# Patient Record
Sex: Female | Born: 1935 | Race: Black or African American | Hispanic: No | Smoking: Never smoker
Health system: Southern US, Community
[De-identification: ages and names within clinical notes are randomized; demographics above are authoritative.]

## PROBLEM LIST (undated history)

## (undated) DIAGNOSIS — I5189 Other ill-defined heart diseases: Secondary | ICD-10-CM

## (undated) DIAGNOSIS — K449 Diaphragmatic hernia without obstruction or gangrene: Secondary | ICD-10-CM

## (undated) DIAGNOSIS — K2950 Unspecified chronic gastritis without bleeding: Secondary | ICD-10-CM

## (undated) DIAGNOSIS — C50919 Malignant neoplasm of unspecified site of unspecified female breast: Secondary | ICD-10-CM

## (undated) DIAGNOSIS — I10 Essential (primary) hypertension: Secondary | ICD-10-CM

## (undated) DIAGNOSIS — I872 Venous insufficiency (chronic) (peripheral): Secondary | ICD-10-CM

## (undated) DIAGNOSIS — M1990 Unspecified osteoarthritis, unspecified site: Secondary | ICD-10-CM

## (undated) DIAGNOSIS — J309 Allergic rhinitis, unspecified: Secondary | ICD-10-CM

## (undated) DIAGNOSIS — M545 Low back pain, unspecified: Secondary | ICD-10-CM

## (undated) DIAGNOSIS — F411 Generalized anxiety disorder: Secondary | ICD-10-CM

## (undated) DIAGNOSIS — G5600 Carpal tunnel syndrome, unspecified upper limb: Secondary | ICD-10-CM

## (undated) DIAGNOSIS — M47812 Spondylosis without myelopathy or radiculopathy, cervical region: Secondary | ICD-10-CM

## (undated) DIAGNOSIS — I452 Bifascicular block: Secondary | ICD-10-CM

## (undated) DIAGNOSIS — N281 Cyst of kidney, acquired: Secondary | ICD-10-CM

## (undated) DIAGNOSIS — G894 Chronic pain syndrome: Secondary | ICD-10-CM

## (undated) DIAGNOSIS — K635 Polyp of colon: Secondary | ICD-10-CM

## (undated) HISTORY — DX: Venous insufficiency (chronic) (peripheral): I87.2

## (undated) HISTORY — DX: Unspecified osteoarthritis, unspecified site: M19.90

## (undated) HISTORY — DX: Unspecified chronic gastritis without bleeding: K29.50

## (undated) HISTORY — DX: Low back pain: M54.5

## (undated) HISTORY — DX: Polyp of colon: K63.5

## (undated) HISTORY — DX: Bifascicular block: I45.2

## (undated) HISTORY — DX: Cyst of kidney, acquired: N28.1

## (undated) HISTORY — DX: Allergic rhinitis, unspecified: J30.9

## (undated) HISTORY — DX: Low back pain, unspecified: M54.50

## (undated) HISTORY — DX: Essential (primary) hypertension: I10

## (undated) HISTORY — DX: Diaphragmatic hernia without obstruction or gangrene: K44.9

## (undated) HISTORY — DX: Spondylosis without myelopathy or radiculopathy, cervical region: M47.812

## (undated) HISTORY — DX: Generalized anxiety disorder: F41.1

## (undated) HISTORY — PX: OTHER SURGICAL HISTORY: SHX169

## (undated) HISTORY — DX: Malignant neoplasm of unspecified site of unspecified female breast: C50.919

## (undated) HISTORY — DX: Other ill-defined heart diseases: I51.89

## (undated) HISTORY — DX: Chronic pain syndrome: G89.4

## (undated) HISTORY — PX: CARPAL TUNNEL RELEASE: SHX101

## (undated) HISTORY — DX: Carpal tunnel syndrome, unspecified upper limb: G56.00

---

## 2001-11-11 ENCOUNTER — Encounter: Admission: RE | Admit: 2001-11-11 | Discharge: 2001-11-11 | Payer: Self-pay | Admitting: Urology

## 2001-11-11 ENCOUNTER — Encounter: Payer: Self-pay | Admitting: Urology

## 2001-12-03 ENCOUNTER — Other Ambulatory Visit: Admission: RE | Admit: 2001-12-03 | Discharge: 2001-12-03 | Payer: Self-pay | Admitting: Obstetrics and Gynecology

## 2002-05-24 ENCOUNTER — Encounter: Payer: Self-pay | Admitting: Gastroenterology

## 2002-05-24 DIAGNOSIS — K5730 Diverticulosis of large intestine without perforation or abscess without bleeding: Secondary | ICD-10-CM

## 2002-05-24 HISTORY — DX: Diverticulosis of large intestine without perforation or abscess without bleeding: K57.30

## 2004-03-31 ENCOUNTER — Emergency Department (HOSPITAL_COMMUNITY): Admission: EM | Admit: 2004-03-31 | Discharge: 2004-03-31 | Payer: Self-pay | Admitting: Family Medicine

## 2004-09-20 ENCOUNTER — Ambulatory Visit: Payer: Self-pay | Admitting: Pulmonary Disease

## 2004-12-04 ENCOUNTER — Ambulatory Visit: Payer: Self-pay | Admitting: Pulmonary Disease

## 2004-12-10 ENCOUNTER — Ambulatory Visit (HOSPITAL_COMMUNITY): Admission: RE | Admit: 2004-12-10 | Discharge: 2004-12-10 | Payer: Self-pay | Admitting: Pulmonary Disease

## 2004-12-25 ENCOUNTER — Emergency Department (HOSPITAL_COMMUNITY): Admission: EM | Admit: 2004-12-25 | Discharge: 2004-12-25 | Payer: Self-pay | Admitting: Emergency Medicine

## 2004-12-31 ENCOUNTER — Ambulatory Visit: Payer: Self-pay | Admitting: Pulmonary Disease

## 2005-02-21 ENCOUNTER — Ambulatory Visit: Payer: Self-pay | Admitting: Pulmonary Disease

## 2005-03-03 ENCOUNTER — Ambulatory Visit: Payer: Self-pay | Admitting: Internal Medicine

## 2005-03-06 ENCOUNTER — Ambulatory Visit: Payer: Self-pay | Admitting: Pulmonary Disease

## 2005-03-13 ENCOUNTER — Ambulatory Visit: Payer: Self-pay

## 2005-03-18 ENCOUNTER — Ambulatory Visit: Payer: Self-pay | Admitting: Internal Medicine

## 2005-03-25 ENCOUNTER — Ambulatory Visit: Payer: Self-pay | Admitting: Pulmonary Disease

## 2005-03-26 ENCOUNTER — Ambulatory Visit: Payer: Self-pay | Admitting: Internal Medicine

## 2005-04-22 ENCOUNTER — Ambulatory Visit: Payer: Self-pay | Admitting: Pulmonary Disease

## 2005-05-29 ENCOUNTER — Ambulatory Visit: Payer: Self-pay | Admitting: Pulmonary Disease

## 2005-06-17 ENCOUNTER — Ambulatory Visit: Payer: Self-pay | Admitting: Pulmonary Disease

## 2005-07-24 ENCOUNTER — Encounter: Admission: RE | Admit: 2005-07-24 | Discharge: 2005-07-24 | Payer: Self-pay | Admitting: Orthopaedic Surgery

## 2005-08-18 ENCOUNTER — Ambulatory Visit: Payer: Self-pay | Admitting: Pulmonary Disease

## 2005-08-21 ENCOUNTER — Ambulatory Visit: Payer: Self-pay | Admitting: Pulmonary Disease

## 2005-09-18 ENCOUNTER — Ambulatory Visit: Payer: Self-pay | Admitting: Pulmonary Disease

## 2005-11-05 ENCOUNTER — Ambulatory Visit: Payer: Self-pay | Admitting: Pulmonary Disease

## 2005-11-14 ENCOUNTER — Ambulatory Visit: Payer: Self-pay | Admitting: Pulmonary Disease

## 2005-12-02 ENCOUNTER — Ambulatory Visit: Payer: Self-pay | Admitting: Pulmonary Disease

## 2005-12-26 ENCOUNTER — Ambulatory Visit: Payer: Self-pay | Admitting: Internal Medicine

## 2006-01-22 ENCOUNTER — Ambulatory Visit: Payer: Self-pay | Admitting: Internal Medicine

## 2006-02-03 ENCOUNTER — Ambulatory Visit: Payer: Self-pay | Admitting: Internal Medicine

## 2006-02-24 ENCOUNTER — Ambulatory Visit: Payer: Self-pay | Admitting: Pulmonary Disease

## 2006-04-09 ENCOUNTER — Encounter: Admission: RE | Admit: 2006-04-09 | Discharge: 2006-04-09 | Payer: Self-pay | Admitting: Orthopaedic Surgery

## 2006-06-26 ENCOUNTER — Ambulatory Visit: Payer: Self-pay | Admitting: Pulmonary Disease

## 2006-07-03 ENCOUNTER — Ambulatory Visit: Payer: Self-pay | Admitting: Internal Medicine

## 2006-07-03 ENCOUNTER — Ambulatory Visit: Payer: Self-pay | Admitting: Pulmonary Disease

## 2006-07-17 ENCOUNTER — Ambulatory Visit: Payer: Self-pay | Admitting: Internal Medicine

## 2006-07-29 ENCOUNTER — Ambulatory Visit: Payer: Self-pay | Admitting: Pulmonary Disease

## 2006-08-26 ENCOUNTER — Ambulatory Visit: Payer: Self-pay | Admitting: Cardiology

## 2006-09-02 ENCOUNTER — Ambulatory Visit: Payer: Self-pay

## 2006-09-08 ENCOUNTER — Ambulatory Visit: Payer: Self-pay | Admitting: Internal Medicine

## 2006-09-17 ENCOUNTER — Ambulatory Visit: Payer: Self-pay | Admitting: Pulmonary Disease

## 2006-11-04 ENCOUNTER — Ambulatory Visit: Payer: Self-pay | Admitting: Pulmonary Disease

## 2006-11-04 LAB — CONVERTED CEMR LAB
Crystals: NEGATIVE
Mucus, UA: NEGATIVE
Specific Gravity, Urine: 1.025 (ref 1.000–1.03)
Urine Glucose: NEGATIVE mg/dL
Urobilinogen, UA: 0.2 (ref 0.0–1.0)

## 2006-12-15 ENCOUNTER — Ambulatory Visit: Payer: Self-pay | Admitting: Internal Medicine

## 2006-12-16 ENCOUNTER — Ambulatory Visit: Payer: Self-pay | Admitting: Pulmonary Disease

## 2006-12-16 LAB — CONVERTED CEMR LAB
ALT: 20 units/L (ref 0–40)
AST: 25 units/L (ref 0–37)
Basophils Relative: 0.5 % (ref 0.0–1.0)
Bilirubin, Direct: 0.2 mg/dL (ref 0.0–0.3)
CO2: 29 meq/L (ref 19–32)
Calcium: 9.3 mg/dL (ref 8.4–10.5)
Eosinophils Absolute: 0.1 10*3/uL (ref 0.0–0.6)
Eosinophils Relative: 1.6 % (ref 0.0–5.0)
GFR calc Af Amer: 61 mL/min
GFR calc non Af Amer: 50 mL/min
Glucose, Bld: 100 mg/dL — ABNORMAL HIGH (ref 70–99)
HCT: 40.6 % (ref 36.0–46.0)
Hemoglobin: 13.9 g/dL (ref 12.0–15.0)
Lymphocytes Relative: 36.2 % (ref 12.0–46.0)
MCV: 91.9 fL (ref 78.0–100.0)
Neutro Abs: 3.4 10*3/uL (ref 1.4–7.7)
Neutrophils Relative %: 50.6 % (ref 43.0–77.0)
Sed Rate: 27 mm/hr — ABNORMAL HIGH (ref 0–25)
Sodium: 139 meq/L (ref 135–145)
Total Protein: 6.6 g/dL (ref 6.0–8.3)
WBC: 6.6 10*3/uL (ref 4.5–10.5)

## 2006-12-17 ENCOUNTER — Ambulatory Visit: Payer: Self-pay | Admitting: Cardiology

## 2007-01-01 ENCOUNTER — Ambulatory Visit: Payer: Self-pay | Admitting: Pulmonary Disease

## 2007-01-05 ENCOUNTER — Ambulatory Visit: Payer: Self-pay | Admitting: Gastroenterology

## 2007-01-05 LAB — CONVERTED CEMR LAB: Lipase: 38 units/L (ref 11.0–59.0)

## 2007-01-07 ENCOUNTER — Emergency Department (HOSPITAL_COMMUNITY): Admission: EM | Admit: 2007-01-07 | Discharge: 2007-01-07 | Payer: Self-pay | Admitting: Family Medicine

## 2007-01-13 ENCOUNTER — Encounter (INDEPENDENT_AMBULATORY_CARE_PROVIDER_SITE_OTHER): Payer: Self-pay | Admitting: *Deleted

## 2007-01-13 ENCOUNTER — Ambulatory Visit: Payer: Self-pay | Admitting: Gastroenterology

## 2007-02-02 ENCOUNTER — Ambulatory Visit: Payer: Self-pay | Admitting: Pulmonary Disease

## 2007-05-05 ENCOUNTER — Ambulatory Visit: Payer: Self-pay | Admitting: Pulmonary Disease

## 2007-06-14 ENCOUNTER — Ambulatory Visit: Payer: Self-pay | Admitting: Internal Medicine

## 2007-06-17 ENCOUNTER — Encounter: Payer: Self-pay | Admitting: Internal Medicine

## 2007-06-17 ENCOUNTER — Ambulatory Visit: Payer: Self-pay | Admitting: Internal Medicine

## 2007-06-17 ENCOUNTER — Ambulatory Visit: Payer: Self-pay

## 2007-06-17 LAB — CONVERTED CEMR LAB
BUN: 35 mg/dL — ABNORMAL HIGH (ref 6–23)
CO2: 31 meq/L (ref 19–32)
Creatinine, Ser: 1.2 mg/dL (ref 0.4–1.2)
GFR calc Af Amer: 55 mL/min
Potassium: 4.8 meq/L (ref 3.5–5.1)
Sodium: 140 meq/L (ref 135–145)

## 2007-07-14 ENCOUNTER — Ambulatory Visit: Payer: Self-pay | Admitting: Pulmonary Disease

## 2007-08-31 ENCOUNTER — Ambulatory Visit: Payer: Self-pay | Admitting: Pulmonary Disease

## 2007-09-15 DIAGNOSIS — M1990 Unspecified osteoarthritis, unspecified site: Secondary | ICD-10-CM

## 2007-09-15 DIAGNOSIS — I872 Venous insufficiency (chronic) (peripheral): Secondary | ICD-10-CM | POA: Insufficient documentation

## 2007-09-15 DIAGNOSIS — M545 Low back pain, unspecified: Secondary | ICD-10-CM | POA: Insufficient documentation

## 2007-09-15 DIAGNOSIS — F411 Generalized anxiety disorder: Secondary | ICD-10-CM | POA: Insufficient documentation

## 2007-09-15 DIAGNOSIS — I10 Essential (primary) hypertension: Secondary | ICD-10-CM | POA: Insufficient documentation

## 2007-09-17 ENCOUNTER — Ambulatory Visit: Payer: Self-pay | Admitting: Pulmonary Disease

## 2007-09-17 LAB — CONVERTED CEMR LAB
AST: 23 units/L (ref 0–37)
Albumin: 3.7 g/dL (ref 3.5–5.2)
Alkaline Phosphatase: 40 units/L (ref 39–117)
Bacteria, UA: NEGATIVE
Basophils Relative: 0.8 % (ref 0.0–1.0)
Bilirubin Urine: NEGATIVE
CO2: 31 meq/L (ref 19–32)
Chloride: 109 meq/L (ref 96–112)
Creatinine, Ser: 1 mg/dL (ref 0.4–1.2)
Crystals: NEGATIVE
Eosinophils Relative: 2.3 % (ref 0.0–5.0)
HCT: 39.6 % (ref 36.0–46.0)
Hemoglobin: 13.5 g/dL (ref 12.0–15.0)
LDL Cholesterol: 103 mg/dL — ABNORMAL HIGH (ref 0–99)
Lymphocytes Relative: 36.1 % (ref 12.0–46.0)
Monocytes Absolute: 0.6 10*3/uL (ref 0.2–0.7)
Neutro Abs: 3 10*3/uL (ref 1.4–7.7)
Neutrophils Relative %: 49.9 % (ref 43.0–77.0)
Nitrite: NEGATIVE
RDW: 13.2 % (ref 11.5–14.6)
Sodium: 144 meq/L (ref 135–145)
Specific Gravity, Urine: 1.02 (ref 1.000–1.03)
TSH: 1.25 microintl units/mL (ref 0.35–5.50)
Total Bilirubin: 1.2 mg/dL (ref 0.3–1.2)
Total Protein, Urine: NEGATIVE mg/dL
Total Protein: 6.9 g/dL (ref 6.0–8.3)
VLDL: 11 mg/dL (ref 0–40)

## 2007-09-22 LAB — CONVERTED CEMR LAB
ALT: 21 units/L (ref 0–35)
AST: 23 units/L (ref 0–37)
Albumin: 3.7 g/dL (ref 3.5–5.2)
Alkaline Phosphatase: 40 units/L (ref 39–117)
BUN: 22 mg/dL (ref 6–23)
Bacteria, UA: NEGATIVE
Basophils Absolute: 0 10*3/uL (ref 0.0–0.1)
Basophils Relative: 0.8 % (ref 0.0–1.0)
Bilirubin Urine: NEGATIVE
Bilirubin, Direct: 0.2 mg/dL (ref 0.0–0.3)
CO2: 31 meq/L (ref 19–32)
Calcium: 9.1 mg/dL (ref 8.4–10.5)
Chloride: 109 meq/L (ref 96–112)
Cholesterol: 190 mg/dL (ref 0–200)
Creatinine, Ser: 1 mg/dL (ref 0.4–1.2)
Crystals: NEGATIVE
Eosinophils Absolute: 0.1 10*3/uL (ref 0.0–0.6)
Eosinophils Relative: 2.3 % (ref 0.0–5.0)
Fecal Occult Blood: NEGATIVE
GFR calc Af Amer: 67 mL/min
GFR calc non Af Amer: 56 mL/min
Glucose, Bld: 103 mg/dL — ABNORMAL HIGH (ref 70–99)
HCT: 39.6 % (ref 36.0–46.0)
HDL: 75.4 mg/dL (ref >39.0)
Hemoglobin: 13.5 g/dL (ref 12.0–15.0)
Ketones, ur: NEGATIVE mg/dL
LDL Cholesterol: 103 mg/dL — ABNORMAL HIGH (ref 0–99)
Leukocytes, UA: NEGATIVE
Lymphocytes Relative: 36.1 % (ref 12.0–46.0)
MCHC: 34 g/dL (ref 30.0–36.0)
MCV: 93.7 fL (ref 78.0–100.0)
Monocytes Absolute: 0.6 10*3/uL (ref 0.2–0.7)
Monocytes Relative: 10.9 % (ref 3.0–11.0)
Neutro Abs: 3 10*3/uL (ref 1.4–7.7)
Neutrophils Relative %: 49.9 % (ref 43.0–77.0)
Nitrite: NEGATIVE
OCCULT 1: NEGATIVE
OCCULT 2: NEGATIVE
OCCULT 3: NEGATIVE
OCCULT 4: NEGATIVE
OCCULT 5: NEGATIVE
Platelets: 227 10*3/uL (ref 150–400)
Potassium: 4.6 meq/L (ref 3.5–5.1)
RBC: 4.23 M/uL (ref 3.87–5.11)
RDW: 13.2 % (ref 11.5–14.6)
Sodium: 144 meq/L (ref 135–145)
Specific Gravity, Urine: 1.02 (ref 1.000–1.03)
TSH: 1.25 microintl units/mL (ref 0.35–5.50)
Total Bilirubin: 1.2 mg/dL (ref 0.3–1.2)
Total CHOL/HDL Ratio: 2.5
Total Protein, Urine: NEGATIVE mg/dL
Total Protein: 6.9 g/dL (ref 6.0–8.3)
Triglycerides: 57 mg/dL (ref 0–149)
Urine Glucose: NEGATIVE mg/dL
Urobilinogen, UA: 0.2 (ref 0.0–1.0)
VLDL: 11 mg/dL (ref 0–40)
WBC: 5.8 10*3/uL (ref 4.5–10.5)
pH: 6 (ref 5.0–8.0)

## 2007-10-12 ENCOUNTER — Ambulatory Visit: Payer: Self-pay | Admitting: Pulmonary Disease

## 2007-10-25 ENCOUNTER — Ambulatory Visit: Payer: Self-pay | Admitting: Gastroenterology

## 2007-11-25 ENCOUNTER — Ambulatory Visit: Payer: Self-pay | Admitting: Internal Medicine

## 2007-12-01 ENCOUNTER — Encounter: Payer: Self-pay | Admitting: Pulmonary Disease

## 2007-12-01 ENCOUNTER — Ambulatory Visit: Payer: Self-pay

## 2007-12-14 ENCOUNTER — Ambulatory Visit: Payer: Self-pay | Admitting: Pulmonary Disease

## 2007-12-16 ENCOUNTER — Ambulatory Visit: Payer: Self-pay | Admitting: Cardiology

## 2007-12-26 DIAGNOSIS — N281 Cyst of kidney, acquired: Secondary | ICD-10-CM | POA: Insufficient documentation

## 2007-12-26 DIAGNOSIS — R079 Chest pain, unspecified: Secondary | ICD-10-CM | POA: Insufficient documentation

## 2007-12-26 DIAGNOSIS — K449 Diaphragmatic hernia without obstruction or gangrene: Secondary | ICD-10-CM | POA: Insufficient documentation

## 2007-12-26 LAB — CONVERTED CEMR LAB
Albumin: 3.7 g/dL (ref 3.5–5.2)
BUN: 19 mg/dL (ref 6–23)
Basophils Absolute: 0 10*3/uL (ref 0.0–0.1)
Eosinophils Absolute: 0.1 10*3/uL (ref 0.0–0.6)
GFR calc Af Amer: 60 mL/min
GFR calc non Af Amer: 50 mL/min
Lymphocytes Relative: 46.8 % — ABNORMAL HIGH (ref 12.0–46.0)
Monocytes Absolute: 0.8 10*3/uL — ABNORMAL HIGH (ref 0.2–0.7)
Neutro Abs: 2.7 10*3/uL (ref 1.4–7.7)
Neutrophils Relative %: 39.3 % — ABNORMAL LOW (ref 43.0–77.0)
Platelets: 218 10*3/uL (ref 150–400)
Potassium: 4.6 meq/L (ref 3.5–5.1)
RBC: 4.26 M/uL (ref 3.87–5.11)
RDW: 12.8 % (ref 11.5–14.6)
Sed Rate: 20 mm/hr (ref 0–25)
Sodium: 141 meq/L (ref 135–145)
Total Bilirubin: 0.9 mg/dL (ref 0.3–1.2)
WBC: 6.8 10*3/uL (ref 4.5–10.5)

## 2007-12-28 ENCOUNTER — Encounter: Payer: Self-pay | Admitting: Pulmonary Disease

## 2008-01-31 ENCOUNTER — Encounter: Payer: Self-pay | Admitting: Pulmonary Disease

## 2008-03-07 ENCOUNTER — Ambulatory Visit: Payer: Self-pay | Admitting: Pulmonary Disease

## 2008-03-10 ENCOUNTER — Telehealth (INDEPENDENT_AMBULATORY_CARE_PROVIDER_SITE_OTHER): Payer: Self-pay | Admitting: *Deleted

## 2008-03-13 ENCOUNTER — Telehealth (INDEPENDENT_AMBULATORY_CARE_PROVIDER_SITE_OTHER): Payer: Self-pay | Admitting: *Deleted

## 2008-03-13 ENCOUNTER — Encounter: Payer: Self-pay | Admitting: Pulmonary Disease

## 2008-03-21 ENCOUNTER — Ambulatory Visit: Payer: Self-pay | Admitting: Pulmonary Disease

## 2008-03-21 DIAGNOSIS — M47812 Spondylosis without myelopathy or radiculopathy, cervical region: Secondary | ICD-10-CM | POA: Insufficient documentation

## 2008-05-04 ENCOUNTER — Telehealth (INDEPENDENT_AMBULATORY_CARE_PROVIDER_SITE_OTHER): Payer: Self-pay | Admitting: *Deleted

## 2008-05-05 ENCOUNTER — Ambulatory Visit: Payer: Self-pay | Admitting: Internal Medicine

## 2008-05-15 ENCOUNTER — Encounter: Payer: Self-pay | Admitting: Pulmonary Disease

## 2008-05-23 ENCOUNTER — Ambulatory Visit: Payer: Self-pay | Admitting: Internal Medicine

## 2008-05-23 LAB — CONVERTED CEMR LAB
Calcium: 9.4 mg/dL (ref 8.4–10.5)
GFR calc Af Amer: 55 mL/min
GFR calc non Af Amer: 45 mL/min
Glucose, Bld: 100 mg/dL — ABNORMAL HIGH (ref 70–99)
Potassium: 5.4 meq/L — ABNORMAL HIGH (ref 3.5–5.1)
Pro B Natriuretic peptide (BNP): 28 pg/mL (ref 0.0–100.0)
Sodium: 139 meq/L (ref 135–145)
TSH: 1.46 microintl units/mL (ref 0.35–5.50)

## 2008-06-09 ENCOUNTER — Telehealth (INDEPENDENT_AMBULATORY_CARE_PROVIDER_SITE_OTHER): Payer: Self-pay | Admitting: *Deleted

## 2008-06-16 ENCOUNTER — Ambulatory Visit: Payer: Self-pay | Admitting: Pulmonary Disease

## 2008-06-22 ENCOUNTER — Ambulatory Visit: Payer: Self-pay | Admitting: Pulmonary Disease

## 2008-06-26 ENCOUNTER — Encounter: Payer: Self-pay | Admitting: Pulmonary Disease

## 2008-07-07 ENCOUNTER — Encounter: Payer: Self-pay | Admitting: Pulmonary Disease

## 2008-07-10 ENCOUNTER — Telehealth (INDEPENDENT_AMBULATORY_CARE_PROVIDER_SITE_OTHER): Payer: Self-pay | Admitting: *Deleted

## 2008-07-27 ENCOUNTER — Telehealth (INDEPENDENT_AMBULATORY_CARE_PROVIDER_SITE_OTHER): Payer: Self-pay | Admitting: *Deleted

## 2008-08-05 ENCOUNTER — Emergency Department (HOSPITAL_COMMUNITY): Admission: EM | Admit: 2008-08-05 | Discharge: 2008-08-05 | Payer: Self-pay | Admitting: Family Medicine

## 2008-08-07 ENCOUNTER — Telehealth: Payer: Self-pay | Admitting: Pulmonary Disease

## 2008-08-07 ENCOUNTER — Ambulatory Visit: Payer: Self-pay | Admitting: Pulmonary Disease

## 2008-08-24 ENCOUNTER — Ambulatory Visit: Payer: Self-pay

## 2008-10-03 ENCOUNTER — Ambulatory Visit: Payer: Self-pay | Admitting: Pulmonary Disease

## 2008-10-03 DIAGNOSIS — G5603 Carpal tunnel syndrome, bilateral upper limbs: Secondary | ICD-10-CM

## 2008-10-03 HISTORY — DX: Carpal tunnel syndrome, bilateral upper limbs: G56.03

## 2008-10-17 ENCOUNTER — Telehealth: Payer: Self-pay | Admitting: Pulmonary Disease

## 2008-10-19 ENCOUNTER — Telehealth (INDEPENDENT_AMBULATORY_CARE_PROVIDER_SITE_OTHER): Payer: Self-pay | Admitting: *Deleted

## 2008-10-23 ENCOUNTER — Encounter: Payer: Self-pay | Admitting: Pulmonary Disease

## 2008-11-01 ENCOUNTER — Telehealth: Payer: Self-pay | Admitting: Pulmonary Disease

## 2008-12-07 ENCOUNTER — Ambulatory Visit: Payer: Self-pay | Admitting: Pulmonary Disease

## 2008-12-14 ENCOUNTER — Ambulatory Visit: Payer: Self-pay | Admitting: Pulmonary Disease

## 2008-12-24 LAB — CONVERTED CEMR LAB
ALT: 16 units/L (ref 0–35)
AST: 21 units/L (ref 0–37)
Albumin: 3.6 g/dL (ref 3.5–5.2)
BUN: 16 mg/dL (ref 6–23)
Basophils Absolute: 0.1 10*3/uL (ref 0.0–0.1)
Basophils Relative: 1.1 % (ref 0.0–3.0)
Calcium: 9.4 mg/dL (ref 8.4–10.5)
Cholesterol: 194 mg/dL (ref 0–200)
Creatinine, Ser: 1 mg/dL (ref 0.4–1.2)
Eosinophils Absolute: 0.1 10*3/uL (ref 0.0–0.7)
Eosinophils Relative: 1.6 % (ref 0.0–5.0)
GFR calc non Af Amer: 56 mL/min
HCT: 41.7 % (ref 36.0–46.0)
Hemoglobin: 14 g/dL (ref 12.0–15.0)
MCHC: 33.6 g/dL (ref 30.0–36.0)
MCV: 91.5 fL (ref 78.0–100.0)
Neutro Abs: 2.8 10*3/uL (ref 1.4–7.7)
RBC: 4.56 M/uL (ref 3.87–5.11)
TSH: 1.73 microintl units/mL (ref 0.35–5.50)
Total Bilirubin: 1.2 mg/dL (ref 0.3–1.2)
WBC: 6.4 10*3/uL (ref 4.5–10.5)

## 2008-12-27 ENCOUNTER — Telehealth (INDEPENDENT_AMBULATORY_CARE_PROVIDER_SITE_OTHER): Payer: Self-pay | Admitting: *Deleted

## 2009-01-01 ENCOUNTER — Telehealth: Payer: Self-pay | Admitting: Gastroenterology

## 2009-01-10 ENCOUNTER — Telehealth: Payer: Self-pay | Admitting: Gastroenterology

## 2009-01-18 ENCOUNTER — Ambulatory Visit: Payer: Self-pay | Admitting: Gastroenterology

## 2009-01-18 DIAGNOSIS — K5900 Constipation, unspecified: Secondary | ICD-10-CM | POA: Insufficient documentation

## 2009-01-18 DIAGNOSIS — R141 Gas pain: Secondary | ICD-10-CM | POA: Insufficient documentation

## 2009-01-18 DIAGNOSIS — R142 Eructation: Secondary | ICD-10-CM

## 2009-01-18 DIAGNOSIS — R143 Flatulence: Secondary | ICD-10-CM

## 2009-02-21 ENCOUNTER — Ambulatory Visit: Payer: Self-pay | Admitting: Pulmonary Disease

## 2009-02-21 ENCOUNTER — Telehealth (INDEPENDENT_AMBULATORY_CARE_PROVIDER_SITE_OTHER): Payer: Self-pay | Admitting: *Deleted

## 2009-02-21 DIAGNOSIS — R5383 Other fatigue: Secondary | ICD-10-CM

## 2009-02-21 DIAGNOSIS — R5381 Other malaise: Secondary | ICD-10-CM | POA: Insufficient documentation

## 2009-03-05 ENCOUNTER — Telehealth: Payer: Self-pay | Admitting: Internal Medicine

## 2009-03-12 ENCOUNTER — Ambulatory Visit: Payer: Self-pay | Admitting: Internal Medicine

## 2009-03-14 ENCOUNTER — Telehealth (INDEPENDENT_AMBULATORY_CARE_PROVIDER_SITE_OTHER): Payer: Self-pay | Admitting: *Deleted

## 2009-03-21 ENCOUNTER — Ambulatory Visit: Payer: Self-pay | Admitting: Pulmonary Disease

## 2009-03-21 DIAGNOSIS — R071 Chest pain on breathing: Secondary | ICD-10-CM | POA: Insufficient documentation

## 2009-03-21 DIAGNOSIS — I5032 Chronic diastolic (congestive) heart failure: Secondary | ICD-10-CM | POA: Insufficient documentation

## 2009-03-21 DIAGNOSIS — I5030 Unspecified diastolic (congestive) heart failure: Secondary | ICD-10-CM | POA: Insufficient documentation

## 2009-03-21 DIAGNOSIS — R109 Unspecified abdominal pain: Secondary | ICD-10-CM | POA: Insufficient documentation

## 2009-04-02 ENCOUNTER — Ambulatory Visit (HOSPITAL_COMMUNITY): Admission: RE | Admit: 2009-04-02 | Discharge: 2009-04-02 | Payer: Self-pay | Admitting: Pulmonary Disease

## 2009-04-03 ENCOUNTER — Telehealth (INDEPENDENT_AMBULATORY_CARE_PROVIDER_SITE_OTHER): Payer: Self-pay | Admitting: *Deleted

## 2009-04-09 ENCOUNTER — Telehealth: Payer: Self-pay | Admitting: Pulmonary Disease

## 2009-04-27 ENCOUNTER — Telehealth (INDEPENDENT_AMBULATORY_CARE_PROVIDER_SITE_OTHER): Payer: Self-pay | Admitting: *Deleted

## 2009-05-15 ENCOUNTER — Encounter: Payer: Self-pay | Admitting: Pulmonary Disease

## 2009-05-16 ENCOUNTER — Encounter: Payer: Self-pay | Admitting: Pulmonary Disease

## 2009-05-17 ENCOUNTER — Telehealth (INDEPENDENT_AMBULATORY_CARE_PROVIDER_SITE_OTHER): Payer: Self-pay | Admitting: *Deleted

## 2009-05-31 ENCOUNTER — Encounter: Admission: RE | Admit: 2009-05-31 | Discharge: 2009-05-31 | Payer: Self-pay | Admitting: Orthopaedic Surgery

## 2009-06-13 ENCOUNTER — Ambulatory Visit: Payer: Self-pay | Admitting: Pulmonary Disease

## 2009-06-26 ENCOUNTER — Ambulatory Visit: Payer: Self-pay | Admitting: Internal Medicine

## 2009-06-26 DIAGNOSIS — R0602 Shortness of breath: Secondary | ICD-10-CM | POA: Insufficient documentation

## 2009-07-19 ENCOUNTER — Ambulatory Visit: Payer: Self-pay | Admitting: Pulmonary Disease

## 2009-07-31 ENCOUNTER — Encounter: Payer: Self-pay | Admitting: Pulmonary Disease

## 2009-08-16 ENCOUNTER — Telehealth (INDEPENDENT_AMBULATORY_CARE_PROVIDER_SITE_OTHER): Payer: Self-pay | Admitting: *Deleted

## 2009-08-16 ENCOUNTER — Ambulatory Visit: Payer: Self-pay | Admitting: Pulmonary Disease

## 2009-08-20 LAB — CONVERTED CEMR LAB
Bilirubin Urine: NEGATIVE
Ketones, ur: NEGATIVE mg/dL
Nitrite: POSITIVE
Specific Gravity, Urine: <=1.005 (ref 1.000–1.030)
Urobilinogen, UA: 0.2 (ref 0.0–1.0)
pH: 6 (ref 5.0–8.0)

## 2009-08-31 ENCOUNTER — Ambulatory Visit: Payer: Self-pay | Admitting: Internal Medicine

## 2009-08-31 ENCOUNTER — Telehealth (INDEPENDENT_AMBULATORY_CARE_PROVIDER_SITE_OTHER): Payer: Self-pay | Admitting: *Deleted

## 2009-08-31 DIAGNOSIS — R42 Dizziness and giddiness: Secondary | ICD-10-CM | POA: Insufficient documentation

## 2009-09-03 LAB — CONVERTED CEMR LAB
BUN: 20 mg/dL (ref 6–23)
CO2: 30 meq/L (ref 19–32)
Glucose, Bld: 98 mg/dL (ref 70–99)
Potassium: 4.2 meq/L (ref 3.5–5.1)
Sodium: 141 meq/L (ref 135–145)

## 2009-09-10 ENCOUNTER — Telehealth: Payer: Self-pay | Admitting: Pulmonary Disease

## 2009-09-12 ENCOUNTER — Ambulatory Visit: Payer: Self-pay | Admitting: Pulmonary Disease

## 2009-09-14 ENCOUNTER — Telehealth: Payer: Self-pay | Admitting: Adult Health

## 2009-09-19 ENCOUNTER — Telehealth: Payer: Self-pay | Admitting: Pulmonary Disease

## 2009-09-25 ENCOUNTER — Ambulatory Visit: Payer: Self-pay | Admitting: Gynecology

## 2009-09-25 ENCOUNTER — Encounter: Payer: Self-pay | Admitting: Gynecology

## 2009-09-25 ENCOUNTER — Other Ambulatory Visit: Admission: RE | Admit: 2009-09-25 | Discharge: 2009-09-25 | Payer: Self-pay | Admitting: Gynecology

## 2009-10-08 ENCOUNTER — Telehealth: Payer: Self-pay | Admitting: Pulmonary Disease

## 2009-10-12 ENCOUNTER — Telehealth (INDEPENDENT_AMBULATORY_CARE_PROVIDER_SITE_OTHER): Payer: Self-pay | Admitting: *Deleted

## 2009-10-12 ENCOUNTER — Telehealth: Payer: Self-pay | Admitting: Gastroenterology

## 2009-10-12 ENCOUNTER — Ambulatory Visit: Payer: Self-pay | Admitting: Internal Medicine

## 2009-10-15 ENCOUNTER — Telehealth (INDEPENDENT_AMBULATORY_CARE_PROVIDER_SITE_OTHER): Payer: Self-pay | Admitting: *Deleted

## 2009-10-15 LAB — CONVERTED CEMR LAB
Basophils Absolute: 0.1 10*3/uL (ref 0.0–0.1)
Bilirubin Urine: NEGATIVE
Eosinophils Absolute: 0.1 10*3/uL (ref 0.0–0.7)
HCT: 40.5 % (ref 36.0–46.0)
Hemoglobin: 13.5 g/dL (ref 12.0–15.0)
Ketones, ur: NEGATIVE mg/dL
Leukocytes, UA: NEGATIVE
Lymphs Abs: 3.2 10*3/uL (ref 0.7–4.0)
MCHC: 33.4 g/dL (ref 30.0–36.0)
MCV: 96.4 fL (ref 78.0–100.0)
Monocytes Absolute: 1 10*3/uL (ref 0.1–1.0)
Monocytes Relative: 12.3 % — ABNORMAL HIGH (ref 3.0–12.0)
Neutro Abs: 3.4 10*3/uL (ref 1.4–7.7)
Platelets: 206 10*3/uL (ref 150.0–400.0)
RDW: 13 % (ref 11.5–14.6)
pH: 5.5 (ref 5.0–8.0)

## 2009-10-16 ENCOUNTER — Ambulatory Visit: Payer: Self-pay | Admitting: Pulmonary Disease

## 2009-10-16 ENCOUNTER — Encounter: Payer: Self-pay | Admitting: Adult Health

## 2009-10-16 LAB — CONVERTED CEMR LAB
Bilirubin Urine: NEGATIVE
Nitrite: NEGATIVE
Total Protein, Urine: NEGATIVE mg/dL
Urine Glucose: NEGATIVE mg/dL
pH: 6 (ref 5.0–8.0)

## 2009-11-11 ENCOUNTER — Ambulatory Visit: Payer: Self-pay | Admitting: Cardiovascular Disease

## 2009-11-11 ENCOUNTER — Inpatient Hospital Stay (HOSPITAL_COMMUNITY): Admission: EM | Admit: 2009-11-11 | Discharge: 2009-11-13 | Payer: Self-pay | Admitting: Emergency Medicine

## 2009-11-13 ENCOUNTER — Encounter (INDEPENDENT_AMBULATORY_CARE_PROVIDER_SITE_OTHER): Payer: Self-pay | Admitting: Internal Medicine

## 2009-11-19 ENCOUNTER — Telehealth: Payer: Self-pay | Admitting: Pulmonary Disease

## 2009-11-28 ENCOUNTER — Telehealth (INDEPENDENT_AMBULATORY_CARE_PROVIDER_SITE_OTHER): Payer: Self-pay | Admitting: *Deleted

## 2009-12-06 ENCOUNTER — Ambulatory Visit: Payer: Self-pay | Admitting: Gastroenterology

## 2009-12-06 ENCOUNTER — Encounter (INDEPENDENT_AMBULATORY_CARE_PROVIDER_SITE_OTHER): Payer: Self-pay | Admitting: *Deleted

## 2009-12-07 ENCOUNTER — Ambulatory Visit: Payer: Self-pay | Admitting: Cardiovascular Disease

## 2009-12-18 ENCOUNTER — Ambulatory Visit: Payer: Self-pay | Admitting: Gastroenterology

## 2009-12-19 ENCOUNTER — Ambulatory Visit: Payer: Self-pay | Admitting: Pulmonary Disease

## 2009-12-19 DIAGNOSIS — K635 Polyp of colon: Secondary | ICD-10-CM

## 2009-12-19 HISTORY — DX: Polyp of colon: K63.5

## 2009-12-20 ENCOUNTER — Encounter: Payer: Self-pay | Admitting: Gastroenterology

## 2009-12-21 ENCOUNTER — Telehealth: Payer: Self-pay | Admitting: Pulmonary Disease

## 2010-01-02 ENCOUNTER — Ambulatory Visit: Payer: Self-pay | Admitting: Pulmonary Disease

## 2010-01-02 ENCOUNTER — Telehealth: Payer: Self-pay | Admitting: Pulmonary Disease

## 2010-01-08 ENCOUNTER — Telehealth (INDEPENDENT_AMBULATORY_CARE_PROVIDER_SITE_OTHER): Payer: Self-pay | Admitting: *Deleted

## 2010-01-09 ENCOUNTER — Encounter: Payer: Self-pay | Admitting: Pulmonary Disease

## 2010-02-06 ENCOUNTER — Encounter: Payer: Self-pay | Admitting: Pulmonary Disease

## 2010-02-18 ENCOUNTER — Telehealth: Payer: Self-pay | Admitting: Gastroenterology

## 2010-02-25 ENCOUNTER — Telehealth (INDEPENDENT_AMBULATORY_CARE_PROVIDER_SITE_OTHER): Payer: Self-pay | Admitting: *Deleted

## 2010-02-28 ENCOUNTER — Encounter: Payer: Self-pay | Admitting: Pulmonary Disease

## 2010-02-28 ENCOUNTER — Telehealth (INDEPENDENT_AMBULATORY_CARE_PROVIDER_SITE_OTHER): Payer: Self-pay | Admitting: *Deleted

## 2010-03-05 ENCOUNTER — Encounter: Admission: RE | Admit: 2010-03-05 | Discharge: 2010-03-05 | Payer: Self-pay | Admitting: General Surgery

## 2010-03-20 ENCOUNTER — Ambulatory Visit: Payer: Self-pay | Admitting: Pulmonary Disease

## 2010-03-22 ENCOUNTER — Telehealth: Payer: Self-pay | Admitting: Pulmonary Disease

## 2010-03-23 LAB — CONVERTED CEMR LAB
Basophils Relative: 0.6 % (ref 0.0–3.0)
Bilirubin, Direct: 0.1 mg/dL (ref 0.0–0.3)
Calcium: 9.7 mg/dL (ref 8.4–10.5)
Chloride: 107 meq/L (ref 96–112)
Creatinine, Ser: 1.1 mg/dL (ref 0.4–1.2)
Eosinophils Absolute: 0.1 10*3/uL (ref 0.0–0.7)
Hemoglobin: 14 g/dL (ref 12.0–15.0)
Lymphs Abs: 2.7 10*3/uL (ref 0.7–4.0)
MCHC: 33.7 g/dL (ref 30.0–36.0)
MCV: 94.7 fL (ref 78.0–100.0)
Monocytes Absolute: 0.8 10*3/uL (ref 0.1–1.0)
Neutro Abs: 3.5 10*3/uL (ref 1.4–7.7)
RBC: 4.38 M/uL (ref 3.87–5.11)
Sodium: 145 meq/L (ref 135–145)
Total Bilirubin: 0.6 mg/dL (ref 0.3–1.2)
Vit D, 25-Hydroxy: 51 ng/mL (ref 30–89)

## 2010-03-27 ENCOUNTER — Ambulatory Visit: Payer: Self-pay | Admitting: Gastroenterology

## 2010-03-27 DIAGNOSIS — K219 Gastro-esophageal reflux disease without esophagitis: Secondary | ICD-10-CM | POA: Insufficient documentation

## 2010-04-01 ENCOUNTER — Encounter: Payer: Self-pay | Admitting: Pulmonary Disease

## 2010-04-10 ENCOUNTER — Telehealth: Payer: Self-pay | Admitting: Pulmonary Disease

## 2010-04-29 ENCOUNTER — Telehealth (INDEPENDENT_AMBULATORY_CARE_PROVIDER_SITE_OTHER): Payer: Self-pay | Admitting: *Deleted

## 2010-05-13 ENCOUNTER — Encounter: Payer: Self-pay | Admitting: Pulmonary Disease

## 2010-06-03 ENCOUNTER — Encounter: Payer: Self-pay | Admitting: Pulmonary Disease

## 2010-07-03 ENCOUNTER — Ambulatory Visit: Payer: Self-pay | Admitting: Pulmonary Disease

## 2010-07-25 ENCOUNTER — Telehealth (INDEPENDENT_AMBULATORY_CARE_PROVIDER_SITE_OTHER): Payer: Self-pay | Admitting: *Deleted

## 2010-08-07 ENCOUNTER — Telehealth (INDEPENDENT_AMBULATORY_CARE_PROVIDER_SITE_OTHER): Payer: Self-pay | Admitting: *Deleted

## 2010-08-16 ENCOUNTER — Ambulatory Visit: Payer: Self-pay | Admitting: Pulmonary Disease

## 2010-08-16 ENCOUNTER — Telehealth (INDEPENDENT_AMBULATORY_CARE_PROVIDER_SITE_OTHER): Payer: Self-pay | Admitting: *Deleted

## 2010-08-29 ENCOUNTER — Telehealth: Payer: Self-pay | Admitting: Internal Medicine

## 2010-09-11 ENCOUNTER — Encounter: Payer: Self-pay | Admitting: Internal Medicine

## 2010-09-11 ENCOUNTER — Ambulatory Visit: Payer: Self-pay | Admitting: Internal Medicine

## 2010-10-31 ENCOUNTER — Ambulatory Visit: Payer: Self-pay | Admitting: Pulmonary Disease

## 2010-11-01 ENCOUNTER — Ambulatory Visit: Payer: Self-pay | Admitting: Gynecology

## 2010-11-06 ENCOUNTER — Ambulatory Visit: Payer: Self-pay | Admitting: Gynecology

## 2010-11-06 ENCOUNTER — Encounter: Payer: Self-pay | Admitting: Pulmonary Disease

## 2010-11-12 ENCOUNTER — Telehealth: Payer: Self-pay | Admitting: Pulmonary Disease

## 2010-12-02 ENCOUNTER — Encounter: Payer: Self-pay | Admitting: Pulmonary Disease

## 2010-12-02 ENCOUNTER — Telehealth (INDEPENDENT_AMBULATORY_CARE_PROVIDER_SITE_OTHER): Payer: Self-pay | Admitting: *Deleted

## 2010-12-08 ENCOUNTER — Encounter: Payer: Self-pay | Admitting: Orthopaedic Surgery

## 2010-12-19 NOTE — Letter (Signed)
Summary: Patient Notice- Colon Biospy Results  [facility]  [address] [place], [place] [address]   Phone: [phone]  Fax: [phone]        December 20, 2009 MRN: [id number]    [facility] [address] [place], [place]  [address]    Dear Heather [name],  I am pleased to inform you that the biopsies taken during your recent colonoscopy did not show any evidence of cancer upon pathologic examination. The biopsy was normal.   No further action is needed at this time.  Please follow-up with      your primary care physician for your other healthcare needs.  Continue with the treatment plan as outlined on the day of your      exam.  Please call us if you are having persistent problems or have questions about your condition that have not been fully answered at this time.  Sincerely,  Meryl Dare MD [facility]  This letter has been electronically signed by your physician.

## 2010-12-19 NOTE — Progress Notes (Signed)
Summary: speak to nurse  Phone Note Call from Patient Call back at Home Phone [phone]   Caller: Patient Call For: NADEL Reason for Call: Talk to Nurse Summary of Call: Patient asking to speak to Dr. Jodelle Green nurse about her appt with Dr. Audie Box.  She would not give me any other info. Initial call taken by: Lehman Prom,  November 12, 2010 2:35 PM  Follow-up for Phone Call        called spoke with patient who states she saw Dr. Audie Box on 12.16 and had pap done and results were supposed to be faxed to [facility] attention.  pt would like to know if SN feels she needs a f/u appt here.    also c/o a knot on the left side of her throat making difficult to swallow with white foamy mucus x2weeks.  states she saw a ENT for this same issue 5-35months ago but it has returned.    please advise, thanks! Follow-up by: Boone Master CNA/MA,  November 12, 2010 3:50 PM  Additional Follow-up for Phone Call Additional follow up Details #1::        per SN---call in MMW  #4 oz  for this   1 tsp gargle and swallow four times daily as needed  and she will need to call ENT to set up follow up for this---she does not need follow up right now but she is to keep her next appt with SN---called and spoke with pt and she stated that the MMW does not work for her that she already has some so i informed her to call the ENT to make appt for this---she is aware to keep her appt to see SN in march. Randell Loop [facility]  November 12, 2010 4:30 PM

## 2010-12-19 NOTE — Progress Notes (Signed)
Summary: congestion / sore throat/refer to ent  Phone Note Call from Patient   Caller: Patient Call For: nadel Summary of Call: congestion in the throat magic mouthwash is not helping or mucinex Initial call taken by: Rickard Patience,  January 08, 2010 10:18 AM  Follow-up for Phone Call        called and spoke with pt.  pt states she recently saw SN 01/02/2010 with "throat problems."  Pt states she is following SN's recs:  taking Magic Mouthwash, Mucinex as directed, Alprazolam and sucking on lozenges with no relief of symptoms.  Pt states throat is now sore and it feels like she "is choking"  Pt states she will occ cough up thick white sputum.  Pt wonders if she needs to be referred to ENT.  Will forward to SN to address.  Aundra Millet Reynolds LPN  January 08, 2010 10:26 AM   Additional Follow-up for Phone Call Additional follow up Details #1::        per SN---we will send her to ENT for further eval since the meds prescribed are not working for her.  order has been placed to referral for pt to ent.  thanks Randell Loop CMA  January 08, 2010 10:37 AM   New Problems: SORE THROAT (ICD-462)   Additional Follow-up for Phone Call Additional follow up Details #2::    called and spoke with pt.  pt aware SN is going to refer her to ENT.  Arman Filter LPN  January 08, 2010 10:44 AM   New Problems: SORE THROAT (ICD-462)

## 2010-12-19 NOTE — Progress Notes (Signed)
Summary: pt having dizziness  Phone Note Call from Patient Call back at Home Phone [phone]   Caller: Patient Summary of Call: Pt having dizziness. Initial call taken by: Judie Grieve,  August 29, 2010 2:27 PM  Follow-up for Phone Call        Left message to call back Meredith Staggers, RN  August 29, 2010 5:08 PM   spoke w/pt she states for past week she has been having dizziness that occurs when she lays down at night, gets worse when lays on back, better when she turns to the side, ok when gets up, she describes it as spinning, advised she should f/u w/pcp she is agreeable, she hasn't seen Dr Gala Romney in  a year so appt was sch w/him for 10/26 Follow-up by: Meredith Staggers, RN,  August 30, 2010 9:30 AM

## 2010-12-19 NOTE — Progress Notes (Signed)
Summary: rx request/ cough  Phone Note Call from Patient Call back at Home Phone [phone]   Caller: Patient Call For: Robecca Fulgham Summary of Call: pt c/o cough since wed night (pt saw sn wed morning). pt states that she gets "hot" all of a sudden- off and on". feels achey. cough is mostly non-productive. denies fever. taking musinex along with her other meds. requests a rx called [facility] to [facility] today.  Initial call taken by: Tivis Ringer, CNA,  December 21, 2009 9:35 AM  Follow-up for Phone Call        Please advise. Allergies: fentanyl. [name] CMA  December 21, 2009 9:52 AM    per SN---ok for pt to have zpak  #1  take as directed and use tylenol 2 tabs by mouth three times a day .  thanks Randell Loop CMA  December 21, 2009 10:25 AM   rx sent.pt aware of recs. pt also asked about cough medicine. I advised to try OTC delsym. [name] CMA  December 21, 2009 10:45 AM     New/Updated Medications: ZITHROMAX Z-PAK 250 MG TABS (AZITHROMYCIN) as directed Prescriptions: ZITHROMAX Z-PAK 250 MG TABS (AZITHROMYCIN) as directed  #1 pak x 0   Entered by:   [name] CMA   Authorized by:   Michele Mcalpine MD   Signed by:   [name] CMA on 12/21/2009   Method used:   Electronically to        [facility] (retail)       [address]       [place], [place]  [id number]       Ph: [phone] or [phone]       Fax: [phone]   RxID:   [id number]

## 2010-12-19 NOTE — Letter (Signed)
Summary: [facility]   Imported By: Sherian Rein 06/19/2010 14:20:27  _____________________________________________________________________  External Attachment:    Type:   Image     Comment:   External Document

## 2010-12-19 NOTE — Progress Notes (Signed)
Summary: low bp/ speak to nurse-  Phone Note Call from Patient Call back at Home Phone [phone]   Caller: Patient Call For: Heather [name] Summary of Call: pt states that she just left the office of dr Derrell Lolling re: her breasts. she was told to call dr Kriste Basque re: low bp (pt didn't remember the #'s).  Initial call taken by: Tivis Ringer, CNA,  February 28, 2010 12:26 PM  Follow-up for Phone Call        Called and spoke with pt.  She states that at her appt with Dr Derrell Lolling today, her BP was low and she was instructed to call SN.  She does not recall the actual reading so I have caled Dr Jacinto Halim office and spoke with Sedalia Muta, the traige nurse.  She is going to pull her chart and call back with BP reading. Vernie Murders  February 28, 2010 1:19 PM   Diane at [facility] called with pt's BP from her visit today to them - it was 104/68. Follow-up by: Eugene Gavia,  February 28, 2010 1:36 PM  Additional Follow-up for Phone Call Additional follow up Details #1::        Diane called back with reading from today on BP- 104/68.  Pt also wanted SN to know that she will be having mammogram next wk. Additional Follow-up by: Vernie Murders,  February 28, 2010 1:49 PM    Additional Follow-up for Phone Call Additional follow up Details #2::    per SN---this BP reading  104/68  is not too low--it is ok   cont the same meds and keep her regular follow up appt. Randell Loop CMA  February 28, 2010 2:16 PM   Spoke with pt and notified of recs per SN.  Pt verbalized understanding and will keep followup.  Follow-up by: Vernie Murders,  February 28, 2010 2:28 PM

## 2010-12-19 NOTE — Letter (Signed)
Summary: [facility]   Imported By: Sherian Rein 06/18/2010 09:05:25  _____________________________________________________________________  External Attachment:    Type:   Image     Comment:   External Document

## 2010-12-19 NOTE — Letter (Signed)
Summary: [facility]  [facility]   Imported By: Lester Ranson 12/11/2009 08:49:37  _____________________________________________________________________  External Attachment:    Type:   Image     Comment:   External Document

## 2010-12-19 NOTE — Progress Notes (Signed)
  Phone Note From Other Clinic   Caller: [facility] Initial call taken by: KM    Faxed all Cardaic/Nadel Records over to [phone] [facility]  July 25, 2010 11:56 AM

## 2010-12-19 NOTE — Progress Notes (Signed)
Summary: Abd is distended  Phone Note Call from Patient Call back at Home Phone [phone]   Call For: Dr Russella Dar Reason for Call: Talk to Nurse Summary of Call: Something is wrong with her stomach. It keeps getting big feels like she is 8months pregnant. Thinks she needs to have an xray to see what is in there a growth or something. She cant get anything on around her waist. Initial call taken by: Leanor Kail [facility],  February 18, 2010 4:24 PM  Follow-up for Phone Call        patient with continued abdominal bloating she feels her abdomen is bigger and she has further weight loss.  Patient  wants x-rays performed.  I explained to her she had a CT scan and colon in the last few months.  She would like an appointment with Dr Russella Dar.  Patient  is scheduled for 03-27-10 9:30.  She will call back if her symptoms worsen. Follow-up by: Darcey Nora RN, CGRN,  February 19, 2010 8:20 AM

## 2010-12-19 NOTE — Progress Notes (Signed)
Summary: requests to see sn tomorrow/ swollen breast  Phone Note Call from Patient Call back at Home Phone [phone]   Caller: Patient Call For: nadel Summary of Call: pt requests to see dr Kriste Basque tomorrow or asap re: swollen breasts. says both sides are swollen although she had breast cancer and had one breast removed yrs ago, both are swollen and she was told to see sn asap for this.  Initial call taken by: Tivis Ringer, CNA,  February 25, 2010 4:03 PM  Follow-up for Phone Call        per SN---suggest she be checked by surgeon---ccs--f/u hx of breast cancer---order has been sent for this.  Spoke with pt and advised of the above recs per SN.  Pt verbalized understanding.  Follow-up by: Vernie Murders,  February 25, 2010 5:19 PM
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
## 2010-12-19 NOTE — Consult Note (Signed)
Summary: [facility]   Imported By: Sherian Rein 03/19/2010 13:50:49  _____________________________________________________________________  External Attachment:    Type:   Image     Comment:   External Document

## 2010-12-19 NOTE — Consult Note (Signed)
Summary: [facility]  [facility]   Imported By: Lester [place] 01/25/2010 09:42:34  _____________________________________________________________________  External Attachment:    Type:   Image     Comment:   External Document

## 2010-12-19 NOTE — Letter (Signed)
Summary: [facility]  [facility]   Imported By: Lester [place] 04/16/2010 10:47:46  _____________________________________________________________________  External Attachment:    Type:   Image     Comment:   External Document

## 2010-12-19 NOTE — Letter (Signed)
Summary: [facility]  [facility]   Imported By: Sherian Rein 02/20/2010 07:40:46  _____________________________________________________________________  External Attachment:    Type:   Image     Comment:   External Document
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
## 2010-12-19 NOTE — Progress Notes (Signed)
Summary: referral  Phone Note Call from Patient Call back at Home Phone [phone]   Caller: Patient Call For: Jeriko Kowalke Reason for Call: Talk to Nurse Summary of Call: still having stomach trouble, unbarable.  Wants to know if you can send her to another stomach doctor.  Said Dr. Russella Dar has had her on same med for 2 years and still has trouble. Initial call taken by: Eugene Gavia,  November 19, 2009 2:37 PM  Follow-up for Phone Call        Please advise. Zackery Barefoot CMA  November 19, 2009 3:07 PM     per SN---refer to Dr.  Loreta Ave in GI (this doc is local in town)  or to [facility] GI dept-  we just  need to know which one she wants--thanks Randell Loop CMA  November 19, 2009 3:30 PM   pt referred to Dr. Loreta Ave. [name] CMA  November 19, 2009 3:52 PM
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
## 2010-12-19 NOTE — Progress Notes (Signed)
Summary: prescription for antibiotic <<<ZPAK sent  Phone Note Call from Patient Call back at Home Phone [phone]   Caller: Patient Call For: Dr. Kriste Basque Summary of Call: Patient phoned she has a productive cough, she is congested, and yesterday she had a fever but doesnt think she has one this morning. She would like an antibiotic called into [facility]. She stated that he usually calls her in  a Zpack. Patient can be reached at [phone] She would like this called in early becasue she has an appointment this morning at 11 with Dr. Audie Box and would to pick it up while she is out.  Initial call taken by: Vedia Coffer,  December 02, 2010 9:12 AM  Follow-up for Phone Call        Called, spoke with pt.  She c/o prod cough with white mucus, runny nose, chest congestion - onset yesterday.  Temp 99.6 yesterday.  Requesting abx.    Allergies: FENTANYL (FENTANYL) [facility] OV 12.15.11  Dr. Kriste Basque, pls advise.  Thanks! Follow-up by: Gweneth Dimitri RN,  December 02, 2010 10:23 AM  Additional Follow-up for Phone Call Additional follow up Details #1::        per SN---ok for zpak #1  take as directed with no refills.  thanks Randell Loop CMA  December 02, 2010 10:51 AM   Zpak sent to pharmacy. Left detailed msg on AM to make pt aware to contact the pharmacy.Michel Bickers CMA  December 02, 2010 11:15 AM    New/Updated Medications: ZITHROMAX Z-PAK 250 MG TABS (AZITHROMYCIN) Take 2 by mouth today, then 1 by mouth daily until gane Prescriptions: ZITHROMAX Z-PAK 250 MG TABS (AZITHROMYCIN) Take 2 by mouth today, then 1 by mouth daily until gane  #1 x 0   Entered by:   Michel Bickers CMA   Authorized by:   Michele Mcalpine MD   Signed by:   Michel Bickers CMA on 12/02/2010   Method used:   Electronically to        [facility] (retail)       [address]       [place], [place]  [id number]       Ph: [phone] or [phone]       Fax: [phone]   RxID:   [id number]
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
## 2010-12-19 NOTE — Progress Notes (Signed)
Summary: coughing up blood  Phone Note Call from Patient Call back at Home Phone [phone]   Caller: Patient Call For: Kriste Basque Reason for Call: Talk to Nurse Summary of Call: Using Mucinex, getting worse, coghing up tinges of blood in the plegm, like lumps.  PLease respond Initial call taken by: Eugene Gavia,  August 07, 2010 10:44 AM  Follow-up for Phone Call        clearing throat with thick, foamy white mucus occasionally tinged with "lumps" of dark red blood worse since last ov x48month.  swollen glands both sides of her throat.  please advise, thanks!  ALLERGIES: fentanyl.  last ov 8.17.11, next 12.15.11. Follow-up by: Boone Master CNA/MA,  August 07, 2010 11:36 AM  Additional Follow-up for Phone Call Additional follow up Details #1::        per SN---needs abx  rec augmentin 875mg   #14  1 by mouth two times a day until gone.  thanks Randell Loop CMA  August 07, 2010 12:13 PM    Spoke with patient-aware of RX and wants it sent to [facility]. Reynaldo Minium CMA  August 07, 2010 3:33 PM     New/Updated Medications: AUGMENTIN 875-125 MG TABS (AMOXICILLIN-POT CLAVULANATE) take 1 by mouth two times a day Prescriptions: AUGMENTIN 875-125 MG TABS (AMOXICILLIN-POT CLAVULANATE) take 1 by mouth two times a day  #14 x 0   Entered by:   Reynaldo Minium CMA   Authorized by:   Michele Mcalpine MD   Signed by:   Reynaldo Minium CMA on 08/07/2010   Method used:   Electronically to        [facility] (retail)       [address]       [place], [place]  [id number]       Ph: [phone] or [phone]       Fax: [phone]   RxID:   [id number]
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
## 2010-12-19 NOTE — Progress Notes (Signed)
Summary: talk to nurse  Phone Note Call from Patient   Caller: Patient Call For: nadel Summary of Call: calling to see if she can get to name of a stomach dr Initial call taken by: Rickard Patience,  November 28, 2009 11:05 AM  Follow-up for Phone Call        Called, spoke with pt.  States a GI referral was made on 11/19/09 to Dr. Loreta Ave however she has not heard anything from them yet.  Would like to know what's going on and requesting Dr. Kenna Gilbert number.  Will forward to PCCs-please advise. Thanks! Follow-up by: Gweneth Dimitri RN,  November 28, 2009 11:46 AM  Additional Follow-up for Phone Call Additional follow up Details #1::        spoke to pt records where faxed over for dr Loreta Ave to review and decide if the pt could come there for an appt and have not heard back yet py also given phone # for dr Loreta Ave Additional Follow-up by: Oneita Jolly,  November 28, 2009 11:59 AM
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
## 2010-12-19 NOTE — Letter (Signed)
[facility]: [facility]   Imported By: Sherian Rein 12/10/2010 12:50:21  _____________________________________________________________________  External Attachment:    Type:   Image     Comment:   External Document

## 2010-12-19 NOTE — Procedures (Signed)
Summary: Colonoscopy  Patient: Heather [name] Note: All result statuses are Final unless otherwise noted.  Tests: (1) Colonoscopy (COL)   COL Colonoscopy           DONE     [facility]     [address]     [place], [place]  [address]           COLONOSCOPY PROCEDURE REPORT           PATIENT:  Heather, [name]  MR#:  [id number]     BIRTHDATE:  11/06/1918, 77 yrs. old  GENDER:  female           ENDOSCOPIST:  Judie Petit T. Russella Dar, MD, [facility]           PROCEDURE DATE:  12/18/2009     PROCEDURE:  Colonoscopy with biopsy     ASA CLASS:  Class II     INDICATIONS:  1) change in bowel habits  2) abdominal pain  3)     weight loss           MEDICATIONS:   demerol 37.5 mg IV, Versed 6 mg IV           DESCRIPTION OF PROCEDURE:   After the risks benefits and     alternatives of the procedure were thoroughly explained, informed     consent was obtained.  Digital rectal exam was performed and     revealed no abnormalities.   The LB [id number]-[id number] endoscope     was introduced through the anus and advanced to the cecum, which     was identified by both the appendix and ileocecal valve, without     limitations.  The quality of the prep was excellent, using     MoviPrep.  The instrument was then slowly withdrawn as the colon     was fully examined.     <<PROCEDUREIMAGES>>           FINDINGS:  Moderate diverticulosis was found ascending colon to     sigmoid colon. Diveticulosis was more concentrated in the     ascending and sigmoid colon. A sessile polyp was found in the     distal transverse colon. It was 3 mm in size. This was otherwise a     normal examination of the colon.   Retroflexed views in the rectum     revealed internal hemorrhoids and hypertrophied anal papillae. The     time to cecum =  6.5  minutes. The scope was then withdrawn (time     =  8.75  min) from the patient and the procedure completed.           COMPLICATIONS:  None           ENDOSCOPIC IMPRESSION:  1) Moderate diverticulosis ascending colon to sigmoid colon     2) 3 mm sessile polyp in the distal transverse colon     3) Internal hemorrhoids     4) Hypertrophied anal papillae           RECOMMENDATIONS:     1) high fiber diet     2) Await pathology results     3) No plans for future surveillance and screening colonoscopies     due to age     77) No causes for abd pain or bowel habit change noted on CT or     colonoscopy     5) follow up with PCP  [name], MD, [facility]           CC: Michele Mcalpine, MD           n.     Rosalie DoctorVenita Lick. [name] at 12/18/2009 02:21 PM           [name], [id number]  Note: An exclamation mark (!) indicates a result that was not dispersed into the flowsheet. Document Creation Date: 12/18/2009 2:21 PM _______________________________________________________________________  (1) Order result status: Final Collection or observation date-time: 12/18/2009 14:15 Requested date-time:  Receipt date-time:  Reported date-time:  Referring Physician:   Ordering Physician: Claudette Head [phone]) Specimen Source:  Source: Launa Grill Order Number: [id number] Lab site:
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
## 2010-12-19 NOTE — Progress Notes (Signed)
Summary: talk to nurse  Phone Note Call from Patient Call back at Home Phone [phone]   Caller: Patient Call For: nadel Reason for Call: Talk to Nurse Summary of Call: Pain on left side of hip radiating down to ankle since yesterday, pls advise. Initial call taken by: Darletta Moll,  Mar 22, 2010 9:46 AM  Follow-up for Phone Call        Pt states the pain in her back is now radiating down to her hip and leg since yesterday. She has some hydrocodone on hand that SN had prescribed her inthe past for this pain, and she wants to know can she take this again for her pain. I also advised her to call her ortho docotor that she sees for this to let him know that pain is radiating. she staets she will do so. Pelase advise if ok for her to go back on hydrocodone. She does not need rx she has some tabs at home. [name] CMA  Mar 22, 2010 9:58 AM   Additional Follow-up for Phone Call Additional follow up Details #1::        per SN---yes ok to take these meds.  refer her to ortho----make sure she does follow up with this.  thanks Randell Loop CMA  Mar 22, 2010 10:32 AM   pt advised ok to take med. She sees DR. Whitfield as her ortho and states seh will call to set appt. I advised her to call us with her appt date and time. [name] CMA  Mar 22, 2010 10:49 AM

## 2010-12-19 NOTE — Progress Notes (Signed)
Summary: legs swollen  Phone Note Call from Patient   Caller: Patient Call For: nadel Summary of Call: feet  and ankles are swollen and burning . should she increase lasix Initial call taken by: Rickard Patience,  Apr 10, 2010 2:20 PM  Follow-up for Phone Call        Pt c/o increased swelling in both ankles and feet x 1 day. Pt wants to know should she increase her lasix 40mg  from half a pill to a whole tab. Please advise. [name] CMA  Apr 10, 2010 2:48 PM   Additional Follow-up for Phone Call Additional follow up Details #1::        per SN---increase the lasix to 1 tab by mouth every morning for the next 3 days and then back down to 1/2 tab again.  do not stay on the full tablet longer than 3 days.  thanks Randell Loop CMA  Apr 10, 2010 3:44 PM   pt advised.[name] CMA  Apr 10, 2010 3:49 PM

## 2010-12-19 NOTE — Progress Notes (Signed)
Summary: chest congestion  Phone Note Call from Patient   Caller: Patient Call For: Heather [name] Summary of Call: need something for chest congestion zpak  not helping   Initial call taken by: Rickard Patience,  January 02, 2010 11:12 AM  Follow-up for Phone Call        pt states she feels like she has something stuck in her throat, and it sometimes feels like it is "closing up" however she denies SOB. Pt request to be seen tody. she states she can be here in so I schedueld her with TP at 11:45. [name] CMA  January 02, 2010 11:23 AM

## 2010-12-19 NOTE — Progress Notes (Signed)
Summary: still sick - OV scheduled  Phone Note Call from Patient Call back at [facility] Phone [phone]   Caller: Patient Call For: NADEL Summary of Call: pt says abx not clearing up her cough. request to see sn today. [facility] Initial call taken by: Tivis Ringer, CNA,  August 16, 2010 9:40 AM  Follow-up for Phone Call        called, spoke with pt.  She states she is still clearing throat with foamy mucus and getting some blood up at times.  Was given augmentin on 9.21 for these sxs by SN but only some relief from it.   Also having dizziness that started getting worse yesterday.  Requesting OV.  OV scheduled for today at 11:45 with TP -- pt aware.   Follow-up by: Gweneth Dimitri RN,  August 16, 2010 9:49 AM

## 2010-12-19 NOTE — Letter (Signed)
Summary: [facility] Instructions  [facility]  [address] [place], [place] [address]   Phone: [phone]  Fax: [phone]       Heather [name]    1918/10/01    MRN: [id number]        Procedure Day /Date: Tuesday Februaury 1st, 2011     Arrival Time: 1:00pm     Procedure Time: 2:00pm     Location of Procedure:                    _ x_  [facility] (4th Floor)                        PREPARATION FOR COLONOSCOPY WITH MOVIPREP   Starting 5 days prior to your procedure 12/13/09 do not eat nuts, seeds, popcorn, corn, beans, peas,  salads, or any raw vegetables.  Do not take any fiber supplements (e.g. Metamucil, Citrucel, and Benefiber).  THE DAY BEFORE YOUR PROCEDURE         DATE:  Monday   DAY:  12/17/09  1.  Drink clear liquids the entire day-NO SOLID FOOD  2.  Do not drink anything colored red or purple.  Avoid juices with pulp.  No orange juice.  3.  Drink at least 64 oz. (8 glasses) of fluid/clear liquids during the day to prevent dehydration and help the prep work efficiently.  CLEAR LIQUIDS INCLUDE: Water Jello Ice Popsicles Tea (sugar ok, no milk/cream) Powdered fruit flavored drinks Coffee (sugar ok, no milk/cream) Gatorade Juice: apple, white grape, white cranberry  Lemonade Clear bullion, consomm, broth Carbonated beverages (any kind) Strained chicken noodle soup Hard Candy                             4.  In the morning, mix first dose of MoviPrep solution:    Empty 1 Pouch A and 1 Pouch B into the disposable container    Add lukewarm drinking water to the top line of the container. Mix to dissolve    Refrigerate (mixed solution should be used within 24 hrs)  5.  Begin drinking the prep at 5:00 p.m. The MoviPrep container is divided by 4 marks.   Every 15 minutes drink the solution down to the next mark (approximately 8 oz) until the full liter is complete.   6.  Follow completed prep with 16 oz of clear liquid of your  choice (Nothing red or purple).  Continue to drink clear liquids until bedtime.  7.  Before going to bed, mix second dose of MoviPrep solution:    Empty 1 Pouch A and 1 Pouch B into the disposable container    Add lukewarm drinking water to the top line of the container. Mix to dissolve    Refrigerate  THE DAY OF YOUR PROCEDURE      DATE:  12/18/09 DAY:  Tuesday  Beginning at  9:00 a.m. (5 hours before procedure):         1. Every 15 minutes, drink the solution down to the next mark (approx 8 oz) until the full liter is complete.  2. Follow completed prep with 16 oz. of clear liquid of your choice.    3. You may drink clear liquids until  12:00  (2 HOURS BEFORE PROCEDURE).   MEDICATION INSTRUCTIONS  Unless otherwise instructed, you should take regular prescription medications with a small sip of water  as early as possible the morning of your procedure.          OTHER INSTRUCTIONS  You will need a responsible adult at least 75 years of age to accompany you and drive you home.   This person must remain in the waiting room during your procedure.  Wear loose fitting clothing that is easily removed.  Leave jewelry and other valuables at home.  However, you may wish to bring a book to read or  an iPod/MP3 player to listen to music as you wait for your procedure to start.  Remove all body piercing jewelry and leave at home.  Total time from sign-in until discharge is approximately 2-3 hours.  You should go home directly after your procedure and rest.  You can resume normal activities the  day after your procedure.  The day of your procedure you should not:   Drive   Make legal decisions   Operate machinery   Drink alcohol   Return to work  You will receive specific instructions about eating, activities and medications before you leave.    The above instructions have been reviewed and explained to me by   Marchelle Folks.     I fully understand and can verbalize  these instructions _____________________________ Date _________
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
## 2010-12-19 NOTE — Progress Notes (Signed)
Summary: swollen feet  Phone Note Call from Patient Call back at Home Phone [phone]   Caller: Patient Call For: nadel Summary of Call: pt increased her lasix as instructed x 3 days. says her feet- ankles and below are still swollen. pls advise.  Initial call taken by: Tivis Ringer, CNA,  April 29, 2010 9:49 AM  Follow-up for Phone Call        Spoke with pt and she states she took increased lasix x 3 days as instructed but swelling has returned. I asked pt how long she took the whole tab  of lasix and she states x 3 days, but when I asked her when she stopped the increased dose she staes Friday 04-26-10. I advised the last time we spoke was on 04-10-10 so that was 3 weeks ago, did she take the whole tablet x 3 weeks. Pt staets she called and spoke to [facility] nurse on 04-23-10 and was told then to increase lasix for 3 days. There is no record of this. Pt states she took a whole tablet of lasix 40mg  this am because of swelling. I adivsed pt to watch salt in her diet, keep legs elevated. Pt staets she is doing this. Pelase advise.[name] CMA  April 29, 2010 10:26 AM   Additional Follow-up for Phone Call Additional follow up Details #1::        per SN---stay on the lasix 40mg  daily regularly and stay off of her feet, keep her legs up and no salt in her diet.  keep her regularly scheduled ov with SN.  thanks Randell Loop CMA  April 29, 2010 4:15 PM       Additional Follow-up for Phone Call Additional follow up Details #2::    Spoke with pt and notified of the above recs per SN.  Pt verbalized understanding and will keep sched appt and call sooner if needed. Follow-up by: Vernie Murders,  April 30, 2010 11:18 AM

## 2010-12-24 ENCOUNTER — Telehealth: Payer: Self-pay | Admitting: Pulmonary Disease

## 2010-12-26 ENCOUNTER — Encounter: Payer: Self-pay | Admitting: Pulmonary Disease

## 2010-12-26 ENCOUNTER — Ambulatory Visit (INDEPENDENT_AMBULATORY_CARE_PROVIDER_SITE_OTHER): Payer: Medicare Other | Admitting: Pulmonary Disease

## 2010-12-26 DIAGNOSIS — I10 Essential (primary) hypertension: Secondary | ICD-10-CM

## 2010-12-26 DIAGNOSIS — R1084 Generalized abdominal pain: Secondary | ICD-10-CM

## 2010-12-26 DIAGNOSIS — R109 Unspecified abdominal pain: Secondary | ICD-10-CM

## 2010-12-26 DIAGNOSIS — R198 Other specified symptoms and signs involving the digestive system and abdomen: Secondary | ICD-10-CM

## 2010-12-30 ENCOUNTER — Other Ambulatory Visit (HOSPITAL_COMMUNITY): Payer: Self-pay | Admitting: Orthopaedic Surgery

## 2010-12-30 DIAGNOSIS — M545 Low back pain, unspecified: Secondary | ICD-10-CM

## 2010-12-30 DIAGNOSIS — R102 Pelvic and perineal pain: Secondary | ICD-10-CM

## 2011-01-02 ENCOUNTER — Ambulatory Visit: Payer: Medicare Other | Admitting: Gastroenterology

## 2011-01-02 NOTE — Progress Notes (Signed)
Summary: req to see nadel / side pain  Phone Note Call from Patient Call back at Home Phone [phone]   Caller: Patient Call For: nadel Summary of Call: pt wants to be "worked in" w/ dr Kriste Basque for pain in R side x 3 days. no fever. no N or V. did not want to see tp.  Initial call taken by: Tivis Ringer, CNA,  December 24, 2010 10:55 AM  Follow-up for Phone Call        Pt says she has been having apin in her right side for 3 days. She denies any falls. She states the pain is "stabbing" at times or just a dull ache. She refused an appt w/ TP this afternoon and would like to either see SN (no avilable appts) or get recs. She has Vicodin but this is not helping at all. Pls advise.Michel Bickers [facility]  December 24, 2010 12:36 PM  Additional Follow-up for Phone Call Additional follow up Details #1::        Per SN-no openings; let her know when the next opening is on his schedule. Suggest pt go to ER or see one of her speciality drs.Reynaldo Minium CMA  December 24, 2010 5:29 PM   LMTCB.Reynaldo Minium CMA  December 24, 2010 5:29 PM     Additional Follow-up for Phone Call Additional follow up Details #2::    There was a cancelation pt scheduled 12/26/2010 @ 11:30am. Zackery Barefoot CMA  December 25, 2010 9:56 AM
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
## 2011-01-10 ENCOUNTER — Encounter (HOSPITAL_COMMUNITY): Payer: Self-pay

## 2011-01-10 ENCOUNTER — Ambulatory Visit (HOSPITAL_COMMUNITY)
Admission: RE | Admit: 2011-01-10 | Discharge: 2011-01-10 | Disposition: A | Discharge status: Home / Self Care | Payer: Medicare Other | Source: Ambulatory Visit | Attending: Orthopaedic Surgery | Admitting: Orthopaedic Surgery

## 2011-01-10 ENCOUNTER — Encounter (HOSPITAL_COMMUNITY)
Admission: RE | Admit: 2011-01-10 | Discharge: 2011-01-10 | Disposition: A | Discharge status: Home / Self Care | Payer: Medicare Other | Source: Ambulatory Visit | Attending: Orthopaedic Surgery | Admitting: Orthopaedic Surgery

## 2011-01-10 DIAGNOSIS — M545 Low back pain, unspecified: Secondary | ICD-10-CM

## 2011-01-10 DIAGNOSIS — Z9010 Acquired absence of unspecified breast and nipple: Secondary | ICD-10-CM | POA: Insufficient documentation

## 2011-01-10 DIAGNOSIS — R6889 Other general symptoms and signs: Secondary | ICD-10-CM | POA: Insufficient documentation

## 2011-01-10 DIAGNOSIS — M5137 Other intervertebral disc degeneration, lumbosacral region: Secondary | ICD-10-CM | POA: Insufficient documentation

## 2011-01-10 DIAGNOSIS — C50919 Malignant neoplasm of unspecified site of unspecified female breast: Secondary | ICD-10-CM | POA: Insufficient documentation

## 2011-01-10 DIAGNOSIS — R102 Pelvic and perineal pain: Secondary | ICD-10-CM

## 2011-01-10 DIAGNOSIS — M51379 Other intervertebral disc degeneration, lumbosacral region without mention of lumbar back pain or lower extremity pain: Secondary | ICD-10-CM | POA: Insufficient documentation

## 2011-01-10 DIAGNOSIS — Z853 Personal history of malignant neoplasm of breast: Secondary | ICD-10-CM | POA: Insufficient documentation

## 2011-01-10 MED ORDER — TECHNETIUM TC 99M MEDRONATE IV KIT
23.4000 | PACK | Freq: Once | INTRAVENOUS | Status: AC | PRN
  Administered 2011-01-10: 23.4 via INTRAVENOUS

## 2011-01-22 ENCOUNTER — Telehealth: Payer: Self-pay | Admitting: Pulmonary Disease

## 2011-01-28 NOTE — Progress Notes (Signed)
Summary: Hydrocodone-APAP refill request  Phone Note Refill Request Message from:  Fax from Pharmacy on January 22, 2011 10:40 AM  Refills Requested: Medication #1:  HYDROCODONE-ACETAMINOPHEN 5-500 MG TABS take one tablet by mouth every 8 hours as needed for severe pain DO NOT EXCEED 3 PER DAY   Dosage confirmed as above?Dosage Confirmed   Brand Name Necessary? No   Supply Requested: 1 month   Last Refilled: 10/16/2010 Sheliah Plane  (p) [phone]   Method Requested: Telephone to Pharmacy Next Appointment Scheduled: 02/10/2011 w/ SN Initial call taken by: Michel Bickers CMA,  January 22, 2011 10:40 AM  Follow-up for Phone Call        Please advise if okay to send refills.Michel Bickers CMA  January 22, 2011 10:42 AM    Prescriptions: HYDROCODONE-ACETAMINOPHEN 5-500 MG TABS (HYDROCODONE-ACETAMINOPHEN) take one tablet by mouth every 8 hours as needed for severe pain DO NOT EXCEED 3 PER DAY  #90 x 5   Entered by:   Randell Loop CMA   Authorized by:   Michele Mcalpine MD   Signed by:   Randell Loop CMA on 01/22/2011   Method used:   Historical   RxID:   [id number]  rx has been faxed to the pharmacy with 5 refills Randell Loop [facility]  January 22, 2011 12:48 PM

## 2011-01-31 ENCOUNTER — Telehealth (INDEPENDENT_AMBULATORY_CARE_PROVIDER_SITE_OTHER): Payer: Self-pay | Admitting: *Deleted

## 2011-01-31 ENCOUNTER — Other Ambulatory Visit: Payer: Self-pay | Admitting: Pulmonary Disease

## 2011-01-31 ENCOUNTER — Other Ambulatory Visit: Payer: Self-pay | Admitting: Adult Health

## 2011-01-31 ENCOUNTER — Encounter: Payer: Self-pay | Admitting: Adult Health

## 2011-01-31 ENCOUNTER — Ambulatory Visit (INDEPENDENT_AMBULATORY_CARE_PROVIDER_SITE_OTHER): Payer: Medicare Other | Admitting: Adult Health

## 2011-01-31 ENCOUNTER — Ambulatory Visit (INDEPENDENT_AMBULATORY_CARE_PROVIDER_SITE_OTHER)
Admission: RE | Admit: 2011-01-31 | Discharge: 2011-01-31 | Disposition: A | Discharge status: Home / Self Care | Payer: Medicare Other | Source: Ambulatory Visit | Attending: Pulmonary Disease | Admitting: Pulmonary Disease

## 2011-01-31 ENCOUNTER — Other Ambulatory Visit: Payer: Medicare Other

## 2011-01-31 DIAGNOSIS — R05 Cough: Secondary | ICD-10-CM

## 2011-01-31 DIAGNOSIS — R300 Dysuria: Secondary | ICD-10-CM

## 2011-01-31 DIAGNOSIS — R0602 Shortness of breath: Secondary | ICD-10-CM

## 2011-01-31 DIAGNOSIS — R059 Cough, unspecified: Secondary | ICD-10-CM

## 2011-01-31 LAB — URINALYSIS, ROUTINE W REFLEX MICROSCOPIC
Ketones, ur: NEGATIVE
Specific Gravity, Urine: 1.01 (ref 1.000–1.030)
Urine Glucose: NEGATIVE
Urobilinogen, UA: 0.2 (ref 0.0–1.0)
pH: 5.5 (ref 5.0–8.0)

## 2011-01-31 LAB — BASIC METABOLIC PANEL
BUN: 22 mg/dL (ref 6–23)
CO2: 31 mEq/L (ref 19–32)
Chloride: 104 mEq/L (ref 96–112)
Creatinine, Ser: 1 mg/dL (ref 0.4–1.2)

## 2011-02-03 ENCOUNTER — Telehealth: Payer: Self-pay | Admitting: Adult Health

## 2011-02-03 ENCOUNTER — Other Ambulatory Visit: Payer: Self-pay | Admitting: Pulmonary Disease

## 2011-02-04 ENCOUNTER — Encounter: Payer: Self-pay | Admitting: Pulmonary Disease
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
## 2011-02-04 NOTE — Progress Notes (Signed)
Summary: white foamy mucas--apt to see TP at 11:45  Phone Note Call from Patient Call back at Home Phone [phone]   Caller: Patient Call For: NADEL Summary of Call: Patient phoned stated that she feels like her throat is closing up and she is getting up a lot of foamy white mucas. She needs to know what she can do. She can be reached at [phone] Initial call taken by: Vedia Coffer,  January 31, 2011 9:43 AM  Follow-up for Phone Call        called and spoke with pt and she c/o cough w/ clear phlem, and feels her throat is closing up. Pt is coming in to see TP today at 11:45 to be evaluated. Pt verbalized understanding Carver Fila  January 31, 2011 10:14 AM

## 2011-02-06 ENCOUNTER — Ambulatory Visit: Payer: Self-pay | Admitting: Pulmonary Disease

## 2011-02-10 ENCOUNTER — Ambulatory Visit (INDEPENDENT_AMBULATORY_CARE_PROVIDER_SITE_OTHER): Payer: Medicare Other | Admitting: Pulmonary Disease

## 2011-02-10 ENCOUNTER — Encounter: Payer: Self-pay | Admitting: Pulmonary Disease

## 2011-02-10 VITALS — BP 110/64 | HR 75 | Temp 97.5°F | Ht 64.0 in | Wt 160.0 lb

## 2011-02-10 DIAGNOSIS — R109 Unspecified abdominal pain: Secondary | ICD-10-CM

## 2011-02-10 DIAGNOSIS — N281 Cyst of kidney, acquired: Secondary | ICD-10-CM

## 2011-02-10 DIAGNOSIS — R0602 Shortness of breath: Secondary | ICD-10-CM

## 2011-02-10 DIAGNOSIS — M1990 Unspecified osteoarthritis, unspecified site: Secondary | ICD-10-CM

## 2011-02-10 DIAGNOSIS — I10 Essential (primary) hypertension: Secondary | ICD-10-CM

## 2011-02-10 DIAGNOSIS — C50919 Malignant neoplasm of unspecified site of unspecified female breast: Secondary | ICD-10-CM

## 2011-02-10 MED ORDER — FLUTICASONE PROPIONATE 50 MCG/ACT NA SUSP: 2.0000 | Freq: Every day | NASAL | Status: DC

## 2011-02-10 NOTE — Assessment & Plan Note (Signed)
BP well controlled on  Amlodipine, Coreg, Cozaar, & Lasix;  encouraged to take all of her meds regualrly.Marland KitchenMarland Kitchen

## 2011-02-10 NOTE — Assessment & Plan Note (Signed)
She has persistant abd discomfort that is very hard to sort out;  Followed by DrStark for GI and her extensive eval has been reviewed... She has f/u w/ GI soon.

## 2011-02-10 NOTE — Assessment & Plan Note (Signed)
She has Vicodin for Prn use;  Followed by DrWhitfeld & has seen DrNewton for epid steroid shots.Marland KitchenMarland Kitchen

## 2011-02-10 NOTE — Assessment & Plan Note (Signed)
We reviewed a good outpt regimen for sinus congestion & drainage including Zyrtek, Saline, Mucinex, Flonase & she will try it... Much of her chr dyspnea is anxiety & specifically a cancer phobia despite many tests she is hard to reassure.Marland KitchenMarland Kitchen

## 2011-02-10 NOTE — Patient Instructions (Signed)
We updated your med list today... For your Drainage:  Use the OTC ZYRTEK 10mg  in the AM;  Then use the [facility] 600mg  1-2 tabs twice daily w/ lots of water, plus a SALINE nasal mist for your nose every 1-2H during the day;  And use the FLONASE 2 sprays in each nostril at bedtime...  [facility]...  To help your weight you should drink a can of ENSURE daily between meals like a snack...  Keep your up coming appt w/ DrStark to recheck your abdomen...  Let's plan a follow up appt in 4 months.Marland KitchenMarland Kitchen

## 2011-02-10 NOTE — Assessment & Plan Note (Signed)
She has a cancer phobia 7 needs constant reassurance that she does not have recurrent cancer.Marland KitchenMarland Kitchen

## 2011-02-10 NOTE — Progress Notes (Signed)
Subjective:    Patient ID: Heather [name], female    DOB: Nov 04, 1918, 77 y.o.   MRN: [id number]  HPI 77 y/o BF here for a follow up visit... she has multiple medical problems including:  Dyspnea & Anxiety;  HBP;  Hx CP followed by DrBensimhon;  HH/ Gastritis;  Divertics/ Constip;  Functional Abd Pain;  Renal cyst followed by DrKimbrough;  Hx left breast [phone];  DJD/ CSpine spondy/ LBP/ CTS; and Anxiety...  ~  December 26, 2010:  add-on appt due to right side pain x4d she says> "I feel fine, BUT..." states the right post CWP is unbearable, sharp, worse w/ movement, & denies trauma... area over the right 11-12th ribs is tender to palp & reproduces her discomfort;  chest is clear & no rubs w/ O2 sat 98% RA;  last CXR 9/11 clear, some scarring, ectatic Ao, DJD sp;  she wants to relate this all to her prev complaints of Abd pain & swelling, left flank pain, etc (recall extensive evals w/ nothing signif found to explain her varied symptoms)... ** we discussed rx w/ rest, heat, rib binder, Lidoderm patches, Vicodin Prn.  ~  February 10, 2011:  6wk ROV- she saw TP 3/16 w/ cough & mult somatic complaints trouble swallowing, throat closing, drainage, nasal congestion, "harking phlegm" etc;  CXR= stable, ectatic Ao, NAD;  Labs include BMet=wnl, BNP=110, UA=clear;  Rx w/ Zyrtek, Saline, Nasonex, Pepcid, Mucinex, & keep appt w/ DrStark for swallowing eval... Now c/o same constellation of symptoms more or less, never took the Zyrtek or Nasonex (too$$) & we discussed regimen w/ Zyrtek AM, Saline/ Mucinex during the day/ Flonase Qhs...    Prob List:    Dyspnea>  On Mucinex plus her anxiolytics (Alprazolam) but not taking regularly...    HBP>  On Coreg, Norvasc, Cozaar, Lasix & BP= 110/64 & well controlled, she thinks too many meds...    GI>  She has hx HH, gastritis, divertics, constip, & functional abd pain w/ mult extensive evaluations in the past;  On Omep20, Miralax, Senakot-S but she persists w/ c/o swelling  & "something wrong in my stomach it's getting bigger & bigger- I might have cancer"; last CT Abd 1/11 reviewed- divertics, otherw neg;  States her appetite is OK but weight down 12#;  C/o left side of abd hurts when she gets up to walk, no pain sitting or supine;  Offered additional meds (try Levsin or bentyl but she declines);  She has up coming appt w/ DrStark.    DJD>  Followed by DrWhitfield & she's had epid shots by DrNewton in the past    Anxiety>  On Alprazolam but she won't take it regularly for help w/ her severe anxiety & cancer phobia...   Outpatient Encounter Prescriptions as of 02/10/2011  Medication Sig Dispense Refill  . ALPRAZolam (XANAX) 0.5 MG tablet Take 0.5 mg by mouth 3 (three) times daily as needed.        Marland Kitchen amLODipine (NORVASC) 2.5 MG tablet Take 2.5 mg by mouth daily.        Marland Kitchen aspirin 81 MG tablet Take 81 mg by mouth daily.        . carvedilol (COREG) 3.125 MG tablet Take 3.125 mg by mouth 2 (two) times daily with a meal.        . Cholecalciferol (VITAMIN D3) 1000 UNITS tablet Take 1,000 Units by mouth daily.        Marland Kitchen desmopressin (DDAVP) 0.2 MG tablet Take 0.2 mg by  mouth daily. Take 1/2 daily       . furosemide (LASIX) 40 MG tablet Take 40 mg by mouth. Take 1/2 daily        . HYDROcodone-acetaminophen (VICODIN) 5-500 MG per tablet Take 1 tablet by mouth every 8 (eight) hours as needed.        Marland Kitchen losartan (COZAAR) 50 MG tablet Take 50 mg by mouth daily.        . Multiple Vitamin (MULTIVITAMIN) capsule Take 1 capsule by mouth daily.        Marland Kitchen omeprazole (PRILOSEC) 20 MG capsule Take 20 mg by mouth daily.        . polyethylene glycol (MIRALAX / GLYCOLAX) packet Take 17 g by mouth 2 (two) times daily as needed.        . psyllium (METAMUCIL) 58.6 % powder Take 1 packet by mouth daily.        . sennosides-docusate sodium (SENOKOT-S) 8.6-50 MG tablet Take 1 tablet by mouth daily.        . simethicone (MYLICON) 80 MG chewable tablet Chew 80 mg by mouth. Take 1 tablet in afternoon  after meal       . fluticasone (FLONASE) 50 MCG/ACT nasal spray 2 sprays by Nasal route at bedtime.  16 g  11    Allergies  Allergen Reactions  . Fentanyl     REACTION: nausea    Review of Systems        See HPI       The patient complains of decreased hearing, chest discomfort, dyspnea on exertion, abdominal pain, incontinence, muscle weakness, and difficulty walking.  The patient denies anorexia, fever, vision loss, hoarseness, syncope, peripheral edema, prolonged cough, headaches, hemoptysis, melena, hematochezia, severe indigestion/heartburn, hematuria, suspicious skin lesions, transient blindness, depression, abnormal bleeding, enlarged lymph nodes, and angioedema.         She has mult somatic complaints and anxiety...   Objective:   Physical Exam   WD, WN, 77 y/o BF in NAD... she is very anxious... GENERAL:  Alert & oriented; pleasant & cooperative... HEENT:  [place]/AT, EOM-wnl, PERRLA, EACs-clear, TMs-wnl, NOSE-clear, THROAT-clear & wnl, no lesions seen. NECK:  Neck ROM decreased, no JVD; normal carotid impulses w/o bruits; no thyromegaly or nodules palpated; no lymphadenopathy. CHEST:  Clear to P & A; without wheezes/ rales/ or rhonchi heard... + tender right lat 11th & 12th ribs on palp. BREAST:  s/p left mastectomy... right breast normal- without nodules palpated... HEART:  Regular Rhythm; without murmurs/ rubs/ or gallops detected... ABDOMEN:  Soft & nontender; normal bowel sounds; no organomegaly or masses palpated... EXT: without deformities, mod arthritic changes; no varicose veins/ +venous insuffic/ tr edema. NEURO:  CN's intact; no focal neuro deficits... DERM:  No lesions noted; no rash etc...   Assessment & Plan:

## 2011-02-10 NOTE — Assessment & Plan Note (Signed)
Followed by DrKimbrough for this benign cyst> another source of concern & fear for cancer.Marland KitchenMarland Kitchen

## 2011-02-11 ENCOUNTER — Other Ambulatory Visit: Payer: Self-pay | Admitting: Pulmonary Disease

## 2011-02-11 DIAGNOSIS — Z9010 Acquired absence of unspecified breast and nipple: Secondary | ICD-10-CM

## 2011-02-13 NOTE — Progress Notes (Signed)
Summary: cxr and lab results  Phone Note Call from Patient Call back at Home Phone [phone]   Caller: Patient Call For: parrett Summary of Call: Returning Jessica's call. Initial call taken by: Darletta Moll,  February 03, 2011 8:28 AM  Follow-up for Phone Call        pt aware of results of cxr and labs per appends. [name] CMA  February 03, 2011 10:57 AM

## 2011-02-17 ENCOUNTER — Telehealth: Payer: Self-pay | Admitting: Gastroenterology

## 2011-02-17 LAB — COMPREHENSIVE METABOLIC PANEL
ALT: 24 U/L (ref 0–35)
AST: 23 U/L (ref 0–37)
Alkaline Phosphatase: 48 U/L (ref 39–117)
BUN: 16 mg/dL (ref 6–23)
BUN: 8 mg/dL (ref 6–23)
CO2: 23 mEq/L (ref 19–32)
CO2: 27 mEq/L (ref 19–32)
Calcium: 8.5 mg/dL (ref 8.4–10.5)
Calcium: 8.7 mg/dL (ref 8.4–10.5)
Creatinine, Ser: 0.9 mg/dL (ref 0.4–1.2)
GFR calc Af Amer: >60 mL/min (ref >60)
GFR calc non Af Amer: 59 mL/min — ABNORMAL LOW (ref >60)
GFR calc non Af Amer: 60 mL/min — ABNORMAL LOW (ref >60)
Glucose, Bld: 99 mg/dL (ref 70–99)
Potassium: 3.7 mEq/L (ref 3.5–5.1)
Total Protein: 6.8 g/dL (ref 6.0–8.3)

## 2011-02-17 LAB — CULTURE, BLOOD (ROUTINE X 2): Culture: NO GROWTH

## 2011-02-17 LAB — TSH: TSH: 0.75 u[IU]/mL (ref 0.350–4.500)

## 2011-02-17 LAB — CARDIAC PANEL(CRET KIN+CKTOT+MB+TROPI)
Relative Index: 2.9 — ABNORMAL HIGH (ref 0.0–2.5)
Troponin I: 0.02 ng/mL (ref 0.00–0.06)

## 2011-02-17 LAB — URINALYSIS, ROUTINE W REFLEX MICROSCOPIC
Bilirubin Urine: NEGATIVE
Nitrite: NEGATIVE
Specific Gravity, Urine: 1.021 (ref 1.005–1.030)
Urobilinogen, UA: 0.2 mg/dL (ref 0.0–1.0)
pH: 5.5 (ref 5.0–8.0)

## 2011-02-17 LAB — CBC
HCT: 44.3 % (ref 36.0–46.0)
Hemoglobin: 14.6 g/dL (ref 12.0–15.0)
MCHC: 33 g/dL (ref 30.0–36.0)
MCHC: 33.5 g/dL (ref 30.0–36.0)
MCV: 94.8 fL (ref 78.0–100.0)
Platelets: 194 10*3/uL (ref 150–400)
Platelets: 199 10*3/uL (ref 150–400)
RBC: 4.42 MIL/uL (ref 3.87–5.11)
RDW: 14.5 % (ref 11.5–15.5)

## 2011-02-17 LAB — POCT CARDIAC MARKERS
Myoglobin, poc: 101 ng/mL (ref 12–200)
Troponin i, poc: <0.05 ng/mL (ref 0.00–0.09)

## 2011-02-17 LAB — LIPASE, BLOOD
Lipase: 21 U/L (ref 11–59)
Lipase: 22 U/L (ref 11–59)

## 2011-02-17 LAB — DIFFERENTIAL
Eosinophils Relative: 1 % (ref 0–5)
Lymphocytes Relative: 7 % — ABNORMAL LOW (ref 12–46)
Lymphs Abs: 0.5 10*3/uL — ABNORMAL LOW (ref 0.7–4.0)
Neutro Abs: 7.1 10*3/uL (ref 1.7–7.7)

## 2011-02-17 LAB — TROPONIN I: Troponin I: 0.02 ng/mL (ref 0.00–0.06)

## 2011-02-17 LAB — URINE MICROSCOPIC-ADD ON

## 2011-02-17 LAB — CK TOTAL AND CKMB (NOT AT ARMC): CK, MB: 3.1 ng/mL (ref 0.3–4.0)

## 2011-02-17 LAB — URINE CULTURE

## 2011-02-17 NOTE — Telephone Encounter (Signed)
Please see my 03/27/2010 office note. Same advice as I suspect this is the same problem. Continue the following medications:  Miralax Pack (Polyethylene Glycol 3350) .... Two Times A Day As Needed for constipation  Senokot S 8.6-50 Mg Tabs (Sennosides-Docusate Sodium) .... One Tablet By Mouth At Bedtime   Gas-X 80 Mg Chew (Simethicone) .... One Tablet By Mouth four times daily as needed for gas and bloating

## 2011-02-17 NOTE — Telephone Encounter (Signed)
Patient states she is having stomach pain on the left side at her belly button. States her stomach is gurgling a lot. States her stomach is "swelling". Dr Kriste Basque told her to take "2 pills" and she took them and had 2 bowel movements. States her stomach starting swelling 6 months ago but the pain started last Thursday. Please, advise.

## 2011-02-18 NOTE — Telephone Encounter (Signed)
Patient given Dr. Ardell Isaacs recommendations. She will try this and if does not help she will call back.

## 2011-02-20 ENCOUNTER — Other Ambulatory Visit: Payer: Self-pay | Admitting: *Deleted

## 2011-02-20 MED ORDER — FUROSEMIDE 40 MG PO TABS: 40.0000 mg | ORAL_TABLET | Freq: Every day | ORAL | Status: DC

## 2011-03-05 ENCOUNTER — Encounter: Payer: Self-pay | Admitting: Gastroenterology

## 2011-03-05 ENCOUNTER — Ambulatory Visit (INDEPENDENT_AMBULATORY_CARE_PROVIDER_SITE_OTHER): Payer: Medicare Other | Admitting: Gastroenterology

## 2011-03-05 VITALS — BP 134/68 | HR 72 | Ht 65.0 in | Wt 168.0 lb

## 2011-03-05 DIAGNOSIS — K5900 Constipation, unspecified: Secondary | ICD-10-CM

## 2011-03-05 DIAGNOSIS — R198 Other specified symptoms and signs involving the digestive system and abdomen: Secondary | ICD-10-CM

## 2011-03-05 DIAGNOSIS — K219 Gastro-esophageal reflux disease without esophagitis: Secondary | ICD-10-CM

## 2011-03-05 NOTE — Progress Notes (Signed)
History of Present Illness: This is a 77 year old female who were at the stay with her daughter complaining of abdominal swelling. She has mild constipation. She denies any reflux symptoms. These are the same complaints she has had for the past year. She went underwent an abdominal pelvic CT scan last year which showed diverticulosis. Colonoscopy last year showed extensive diverticulosis a 3 mm hyperplastic polyp and hemorrhoids. At that time I felt she had a loss of tone in her abdominal muscles.  Current Medications, Allergies, Past Medical History, Past Surgical History, Family History and Social History were reviewed in [employer] record.  Physical Exam: General: Well developed , well nourished, no acute distress Head: Normocephalic and atraumatic Eyes:  sclerae anicteric, EOMI Ears: Normal auditory acuity Mouth: No deformity or lesions Lungs: Clear throughout to auscultation Heart: Regular rate and rhythm; no murmurs, rubs or bruits Abdomen: Soft, non tender and non distended. No masses, hepatosplenomegaly or hernias noted. Decreased abdominal muscle tone. Normal Bowel sounds Musculoskeletal: Symmetrical with no gross deformities  Pulses:  Normal pulses noted Extremities: No clubbing, cyanosis, edema or deformities noted Neurological: Alert oriented x 4, grossly nonfocal Psychological:  Alert and cooperative. Normal mood and affect  Assessment and Recommendations:  1: OTHER SYMPTOMS INVOLVING DIGESTIVE SYSTEM.  Her abdominal fullness, and slightly larger abdominal girth are most likely secondary to loss of abdominal muscle tone, skeletal changes with age and constipation. No significant gastrointestinal disorders have been found. No further GI evaluation or follow up is needed. Patient and her daughter were reassured.  2: CONSTIPATION. Increase fiber and water intake. Begin a daily fiber supplement. Continue MiraLax as needed. Ongoing followup with Dr. Kriste Basque.  3:  GERD. Presumed underlying GERD. Discontinue omeprazole and if her reflux symptoms return, she can resume omeprazole. Ongoing followup with Dr. Kriste Basque.  4: ANXIETY. She appears to have a significant component of anxiety. Followup with Dr. Kriste Basque.

## 2011-03-07 ENCOUNTER — Ambulatory Visit
Admission: RE | Admit: 2011-03-07 | Discharge: 2011-03-07 | Disposition: A | Discharge status: Home / Self Care | Payer: Medicare Other | Source: Ambulatory Visit | Attending: Pulmonary Disease | Admitting: Pulmonary Disease

## 2011-03-07 DIAGNOSIS — Z9010 Acquired absence of unspecified breast and nipple: Secondary | ICD-10-CM

## 2011-03-10 ENCOUNTER — Telehealth: Payer: Self-pay | Admitting: Pulmonary Disease

## 2011-03-10 DIAGNOSIS — K449 Diaphragmatic hernia without obstruction or gangrene: Secondary | ICD-10-CM

## 2011-03-10 DIAGNOSIS — K219 Gastro-esophageal reflux disease without esophagitis: Secondary | ICD-10-CM

## 2011-03-10 MED ORDER — MECLIZINE HCL 25 MG PO TABS: ORAL_TABLET | ORAL | Status: DC

## 2011-03-10 NOTE — Telephone Encounter (Signed)
lmomtcb x1 

## 2011-03-10 NOTE — Telephone Encounter (Signed)
Spoke w/ pt and she c/o dizziness and both ankles swelling x 1 week. Pt states her left ankle is swelling more than the left. Pt states she doesn't know where the dizziness is coming from, she feels her bp has been normal. Pt states when she walks, her feet stings and burns. Pt states has been taking her lasix 20 mg once a day and is keeping her feet elevated and has not been using salt in her diet. Pt also states she would like a referral to a different gastro doctor b/c sh does not feel like Dr. Russella Dar even paid attention to anything she told him. Pt was not happy w/ him at all. Please advised recs for pt Dr. Kriste Basque. Thanks  Allergies  Allergen Reactions  . Fentanyl     REACTION: nausea    Carver Fila, CMA

## 2011-03-10 NOTE — Telephone Encounter (Signed)
Per SN--rec to increase the lasix 20mg  1-2 every morning--take 2 if swelling is increased, dizzy--use anitvert 25mg   #50  1/2-1 tablet by mouth every 4 hours prn for dizziness,  GI 2nd opinion--refer to [facility] for eval.

## 2011-03-10 NOTE — Telephone Encounter (Signed)
Called and spoke with pt and she is aware of SN recs.

## 2011-03-10 NOTE — Telephone Encounter (Signed)
Pt returning call can be reached at [phone].Heather [name]

## 2011-03-18 ENCOUNTER — Telehealth: Payer: Self-pay | Admitting: Pulmonary Disease

## 2011-03-19 NOTE — Telephone Encounter (Signed)
Spoke to this pt she has been given the correct # for dr Randa Evens office

## 2011-03-24 ENCOUNTER — Other Ambulatory Visit: Payer: Self-pay | Admitting: Internal Medicine

## 2011-04-01 ENCOUNTER — Encounter (INDEPENDENT_AMBULATORY_CARE_PROVIDER_SITE_OTHER): Payer: Self-pay | Admitting: General Surgery

## 2011-04-01 NOTE — Assessment & Plan Note (Signed)
[facility]                            CARDIOLOGY OFFICE NOTE   NAME:Heather [name], Heather [name]                    MRN:          [id number]  DATE:06/14/2007                            DOB:          [birth year]    INTERVAL HISTORY:  Heather [name] is a very pleasant 77 year old woman who  has just celebrated her birthday yesterday.  She has multiple medical  problems including congestive heart failure secondary to diastolic  dysfunction, hypertension and atypical chest pain with a negative  Myoview.  She returns today for routine followup.   Overall she is doing quite well.  She says she does get some shortness  of breath when she walks up steps but this is unchanged, no chest pain.  Over the past 2-3 days she has been having increasing lower extremity  edema.  Typically her edema resolves over night but this time it has  persisted.  She denies any orthopnea or PND.   CURRENT MEDICATIONS:  1. Multivitamin.  2. Aspirin 81.  3. Lasix 20 a day.  4. Cozaar 100.  5. Coreg 3.125 b.i.d.  6. Hyoscyamine.which she is out of.  7. Potassium 10 mEq b.i.d.  8. Prilosec 20 a day.   PHYSICAL EXAMINATION:  She is an elderly woman, in no acute distress,  she ambulates around the clinic slowly with her cane without any  respiratory difficulty.  Blood pressure is 110/58, heart rate is 78.  Weight is 181, that is up about 5 pounds from previous.  HEENT:  Normal.  NECK:  Supple, no JVD, carotids are 2+ bilaterally without bruits, there  is no lymphadenopathy or thyromegaly.  CARDIAC:  PMI is nondisplaced, she is status post left mastectomy.  She  has a regular rate and rhythm with no murmurs, rubs, or gallops.  LUNGS:  Clear.  ABDOMEN:  Soft, nontender, nondistended, good bowel sounds.  No bruits  or mass.  EXTREMITIES:  Warm with no cyanosis or clubbing, there is 2+ edema.  NEURO:  She is alert and oriented x3, cranial nerves II - XII are  intact, she moves all 4  extremities without difficulty.  Affect is  pleasant.   ASSESSMENT/PLAN:  Lower extremity edema, I think this is multifactorial.  Likely due to a combination of venous insufficiency and diastolic  dysfunction.  Her edema today is currently worse than usual.  We will go  ahead and increase her Lasix to 40 a day and will check electrolytes and  her kidney function today, on Thursday, and next week to make sure we  are not drying her out.  I told her if she started to feel lightheaded  she could just cut back to 20 a day.  We will also give her a higher  grade of compression stockings.  Finally, we will get a 2D  echocardiogram to make sure her LV function remains preserved.   DISPOSITION:  Return to clinic in 6 months.     [name]. Bensimhon, MD  Electronically Signed    DRB/MedQ  DD: 06/14/2007  DT: 06/14/2007  Job #: [id number]

## 2011-04-01 NOTE — Assessment & Plan Note (Signed)
[facility]                            CARDIOLOGY OFFICE NOTE   NAME:Heather [name], Heather [name]                    MRN:          [id number]  DATE:11/25/2007                            DOB:          1917/12/06    INTERVAL HISTORY:  Heather [name] is a very pleasant 77 year old woman with  a history of congestive heart failure secondary to diastolic  dysfunction, hypertension, atypical chest pain with a negative Myoview  in 2007, and breast cancer, status post left mastectomy and she presents  today for routine followup.  She continues to have multiple somatic  complaints including excessive flatulence and left-sided flank pain  which has been worked up extensively.  She does have some mild edema but  this is relatively well controlled with Lasix.  She is also complaining  of peeing a lot.  From a cardiopulmonary perspective, she says she  thinks her dyspnea has gotten worse but she is still able to do all her  activities of daily living.  She denies any chest pressure.  She has not  had any orthopnea or PND.   CURRENT MEDICATIONS:  1. Multivitamin.  2. Aspirin 81.  3. Cozaar 100 a day.  4. Potassium.  5. Prilosec.  6. Lasix 40 a day.  7. Xanax 0.5 p.r.n.  8. Carvedilol 3.125 a day.  9. Gas-X p.r.n.   PHYSICAL EXAM:  She is an elderly woman in no acute distress.  She  ambulates around the clinic slowly with her cane without any respiratory  difficulty.  Saturations were 95% at rest and were maintained during  exertion.  Blood pressure is 123/70, heart rate 76, weight is 183.  HEENT:  Normal.  NECK:  Supple, there is no JVD.  Carotids are 2+ bilaterally without any  bruits.  There is no lymphadenopathy or thyromegaly.  CARDIAC:  PMI is nondisplaced.  She has a regular rate and rhythm, no  murmurs, rubs or gallops.  She is status post left mastectomy.  LUNGS:  Clear.  ABDOMEN:  Soft, nontender, nondistended, no hepatosplenomegaly, no  bruits, no masses,  good bowel sounds.  EXTREMITIES:  Warm with no cyanosis or clubbing.  There is trace edema  which is improved from before.  She does have compression hose on.  NEURO:  She is alert and oriented x3.  Cranial nerves II-XII are grossly  intact.  Moves all 4 extremities without difficulty.  Affect is  pleasant.   ASSESSMENT AND PLAN:  1. Chronic diastolic heart failure/lower extremity edema, this is      improved; continue current therapy.  2. Dyspnea, acute on chronic.  Given it has been 2 years since her      previous stress test, will repeat her Myoview.  Otherwise, continue      conservative therapy.  There is no evidence of pulmonary      hypertension on her echocardiogram.  3. Hypertension, well controlled.   DISPOSITION:  Will see her back in 1 year for routine followup, she can  follow with Dr. Kriste Basque.     [name]. Bensimhon, MD  Electronically Signed  DRB/MedQ  DD: 11/25/2007  DT: 11/25/2007  Job #: [id number]   cc:   Lonzo Cloud. Kriste Basque, MD

## 2011-04-01 NOTE — Assessment & Plan Note (Signed)
[facility]                         GASTROENTEROLOGY OFFICE NOTE   NAME:Macfadden, Heather [name]                    MRN:          [id number]  DATE:10/25/2007                            DOB:          May 14, 1918    Heather [name] returns today complaining of left back pain that radiates to  her left flank that is brought on by standing. She has no  gastrointestinal complaints. She completed treatment for Helicobacter  pylori infection in March 2008. She was treated with Pylora and  omeprazole. She has no gastrointestinal complaints.   CURRENT MEDICATIONS:  Listed on the chart, updated and reviewed.   MEDICATION ALLERGIES:  None known.   PHYSICAL EXAMINATION:  In no acute distress. Elderly African American  female. Blood pressure 136/62, pulse 72 and regular.  CHEST: Clear to auscultation bilaterally.  CARDIAC: Regular rate and rhythm without murmurs.  BACK: Reveals left paraspinal tenderness in the lower thoracic and  lumbar areas.  ABDOMEN: Soft and nontender. Normoactive bowel sounds. No palpable  organomegaly, masses or hernias.   ASSESSMENT/PLAN:  1. Left back pain. Further followup with Dr. Alroy Dust and Dr. Norlene Campbell.  2. Gastroesophageal reflux disease. Continue omeprazole 20 mg p.o. q.      a.m. and standard anti-reflux measures. Ongoing followup with Dr.      Kriste Basque. I will see back on referral.     [name], MD, [facility]  Electronically Signed    MTS/MedQ  DD: 10/25/2007  DT: 10/25/2007  Job #: [id number]   cc:   Lonzo Cloud. Kriste Basque, MD

## 2011-04-01 NOTE — Assessment & Plan Note (Signed)
[facility]                            CARDIOLOGY OFFICE NOTE   NAME:[name], [name]                    MRN:          [id number]  DATE:08/24/2008                            DOB:          04-Jan-1918    PRIMARY CARE PHYSICIAN:  Heather Cloud. Kriste Basque, MD   INTERVAL HISTORY:  Heather [name] is an 77 year old woman with a history of  congestive heart failure secondary to diastolic dysfunction,  hypertension, atypical chest pain with recent negative Myoview both in  2007 and 2009, breast cancer status post left mastectomy and chronic  left flank pain.   She presents today for routine followup.  She says overall, she is doing  fairly well.  Denies any chest pain or dyspnea.  She does continue  complain of arthritis pain and she says that she was bending over  yesterday and developed a catch in her left flank and she feels it is  swollen and it is hurting.  Looking back through her record, she has had  this chronic left flank pain with exhaustive workup which has all been  negative.   She walks with a cane, but really is not limited most of her daily  activity.  She denies orthopnea, no PND.   CURRENT MEDICATIONS:  1. Multivitamin.  2. Aspirin 81 a day.  3. Cozaar 100 a day.  4. Omeprazole.  5. Carvedilol 3.125 b.i.d.  6. Lasix 20 mg a day.  7. Amlodipine 2.5 a day.  8. Xanax p.r.n.  9. Tylenol Arthritis.   PHYSICAL EXAMINATION:  GENERAL:  She is an elderly woman, in no acute  distress.  She ambulates around the clinic slowly with her cane with no  respiratory difficulty.  VITAL SIGNS:  Blood pressure was 130/62, heart rate 69, weight is 177.  HEENT:  Normal.  NECK:  Supple.  There is no JVD.  Carotids are 2+ bilaterally any  without bruits.  There is no lymphadenopathy or thyromegaly.  CARDIAC:  PMI is nondisplaced.  She has distant heart sounds.  She is  regular with an S4.  No murmur.  She is status post left mastectomy.  LUNGS:  Clear.  ABDOMEN:   Soft, nontender, nondistended.  No hepatosplenomegaly, no  bruits, no masses.  Good bowel sounds.  There is no swelling on her left  flank.  There is no CVA tenderness.  EXTREMITIES:  Warm with no cyanosis or clubbing.  There is trace edema.  No rash.  NEURO:  Alert and oriented x3.  Cranial nerves II-XII are grossly  intact.  Moves all 4 extremities difficulty.  Affect is pleasant.   ASSESSMENT AND PLAN:  1. Diastolic heart failure.  This is well controlled.  Volume status      looks good.  2. Hypertension.  Blood pressure has much improved.  Continue current      therapy.  3. Left flank pain.  This has been followed by Dr. Kriste [name].  She will      follow up with him.   DISPOSITION:  We will see her back in 9 months to 1 year for routine  followup.  [name]. Bensimhon, MD  Electronically Signed    DRB/MedQ  DD: 08/24/2008  DT: 08/24/2008  Job #: [id number]   cc:   Heather Cloud. Kriste Basque, MD

## 2011-04-02 ENCOUNTER — Other Ambulatory Visit: Payer: Self-pay | Admitting: *Deleted

## 2011-04-02 MED ORDER — AMLODIPINE BESYLATE 2.5 MG PO TABS: 2.5000 mg | ORAL_TABLET | Freq: Every day | ORAL | Status: DC

## 2011-04-04 NOTE — Assessment & Plan Note (Signed)
[facility]                              CARDIOLOGY OFFICE NOTE   NAME:Bobby, JEILANI                      MRN:          [id number]  DATE:08/26/2006                            DOB:          Feb 20, 1918    CARDIOLOGIST:  [name]. Bensimhon, M.D.   PRIMARY CARE PHYSICIAN:  Lonzo Cloud. Kriste Basque, M.D.   HISTORY OF PRESENT ILLNESS:  Ms. Noy is a very pleasant 77 year old  female patient with a history of congestive heart failure secondary to  diastolic dysfunction and hypertension who presents to the office today with  complaints of chest pain this past weekend.  She notes that it is  intermittent.  It is a burning type of pain in the epigastrium.  She notes  it when she moves a certain way.  She also notes it when she walks.  She  does have some shortness of breath that seems to be a little bit worse than  usual.  She denies any orthopnea or paroxysmal nocturnal dyspnea.  Her lower  extremity edema seems to be stable.  She denies any substernal heaviness or  tightness, jaw or arm discomfort, nausea or diaphoresis.  She has had quite  a bit of belching.  She has also described some water brash symptoms.   CURRENT MEDICATIONS:  1. Multivitamin.  2. Aspirin 81 mg daily.  3. Lasix 20 mg daily.  4. Cozaar 100 mg daily.   ALLERGIES:  NO KNOWN DRUG ALLERGIES.   SOCIAL HISTORY:  The patient denies any tobacco abuse.   REVIEW OF SYSTEMS:  Please see HPI.  Denies any fevers, chills.  She has had  a cough productive of some white to clear sputum.  No hemoptysis.  No  melena.  No hematochezia.  No hematuria dysuria.  No odynophagia, dysphagia.  The rest of the review of systems are negative.   PHYSICAL EXAMINATION:  GENERAL:  She is well nourished, well developed in no  distress.  VITAL SIGNS:  Blood pressure is 138/82, pulse 83, weight 178 pounds.  HEENT:  Head normocephalic atraumatic.  Eyes:  PERRLA.  EOMI.  Sclerae are  clear.  NECK:  Without JVD or  thyromegaly.  CARDIAC:  Normal S1 S2.  Regular rate and rhythm.  LUNGS:  Clear to auscultation bilaterally without wheezing, rhonchi, or  rales.  ABDOMEN:  Soft.  Normoactive bowel sounds.  Positive epigastric tenderness.  No organomegaly.  EXTREMITIES:  With trace 1+ edema bilaterally.  Calves are soft and  nontender.  SKIN:  Warm and dry.  NEUROLOGIC:  She is alert and oriented x3.  Cranial nerves II-XII are  grossly intact.   Electrocardiogram reveals sinus rhythm, heart rate is __________  , leftward  axis, nonspecific ST-T wave changes.   IMPRESSION:  1. Atypical chest pain.  2. Chronic diastolic dysfunction.  3. History of presyncopal episode, resolved.  4. History of breast cancer, status post left mastectomy in 1994.  5. Hypertension.  6. Venous insufficiency.   PLAN:  The patient presents to the office today with complaints of chest  discomfort.  She has some typical features  and atypical features.  I think  her symptoms are more consistent with gastroesophageal reflux disease.  However, she does have risk factors of age and hypertension.  She denies a  family history of coronary disease.  We will go ahead and screen her with an  adenosine Myoview to rule out ischemia.  We will also check some labs to  include a C-MET, CBC, and a BNP.  If her BNP is elevated, we can certainly  go up on her diuresis as this may be contributing.  She does complain of  some shortness of breath.  I will go ahead and start her on a proton pump  inhibitor of Protonix 40 mg a day.  I will bring her back in followup Dr. Elvera Lennox.  Bensimhon in the next couple of weeks.  She knows to go to the emergency  room or call us if she has any recurrent symptoms or worsening symptoms.      ______________________________  Tereso Newcomer, PA-C    ______________________________  Luis Abed, MD, [facility]    SW/MedQ  DD:  08/26/2006  DT:  08/26/2006  Job #:  [id number]   cc:   Lonzo Cloud. Kriste Basque, MD

## 2011-04-04 NOTE — Assessment & Plan Note (Signed)
[facility]                         GASTROENTEROLOGY OFFICE NOTE   Heather [name]                      MRN:          [id number]  DATE:12/31/2006                            DOB:          Mar 29, 1918    REFERRING PHYSICIAN:  Lonzo Cloud. Kriste Basque, MD   REASON FOR REFERRAL:  Left upper quadrant abdominal pain.   HISTORY OF PRESENT ILLNESS:  Heather [name] is an 77 year old African-  American female whom I have seen in the past.  She underwent colonoscopy  for screening in July of 2003, which showed sigmoid colon  diverticulosis.  She has had dyspeptic symptoms in the past that have  responded to Protonix, but she has discontinued Protonix, as she states  it makes her feel bad.  She has had problems with left-sided back pain  that radiates around her costal margin to her left upper quadrant for  about the past 4 to 6 weeks.  Her symptoms are clearly exacerbated by  movement.  They resolve when she sits down or lies down.  They are not  related to any digestive function.  In addition, she has problems with  left upper quadrant and upper abdominal gas and bloating.  She has noted  an increase in belching.  She is taking Aleve on a frequent basis to  manage her back pain.  She recently was started on Levsin and  simethicone with no significant improvement in symptoms.  She has no  dysphagia, odynphagia, melena, hematochezia, change in bowel habits,  diarrhea, constipation, or change in stool caliber.  She states she has  had a slight increase in weight with no change in eating pattern and her  appetite is good.  A CT scan of the abdomen and pelvis was performed on  January 31, which showed a form of mid-gut malrotation and most of the  small bowel was noted to be in the right abdomen.  There was no evidence  for volvulus, obstruction, or any other significant disorder, and these  findings are not likely to be clinically significant.  She has  degenerative  changes noted throughout the spine.  A CBC, CMET,  erythrocyte sedimentation rate and TSH were performed on December 16, 2006, which showed a minimally elevated glucose at 100 and a minimally  elevated sed rate at 27.   FAMILY HISTORY:  Negative for colon cancer, colon polyps, and  inflammatory bowel disease.   PAST MEDICAL HISTORY:  Breast cancer status post left mastectomy in  1994, diverticulosis, arthritis, hypertension, anxiety, venous  insufficiency, history of congestive heart failure secondary to  diastolic dysfunction, history of a pre-syncopal episode.   CURRENT MEDICATIONS:  Listed on the chart, updated, and reviewed.   MEDICATION ALLERGIES:  None known.   SOCIAL HISTORY AND REVIEW OF SYSTEMS:  Per the hand-written form.   PHYSICAL EXAM:  In no acute distress.  Height 5 feet 4 inches, weight 178 pounds, blood pressure 132/74, pulse  68 and regular.  HEENT:  Anicteric sclerae.  Oropharynx clear.  CHEST:  Clear to auscultation bilaterally.  BACK:  With significant thoracic and lumbar area left paraspinal  tenderness.  No CVA tenderness.  CARDIAC:  Regular rate and rhythm without murmurs appreciated.  ABDOMEN:  Soft with minimal left upper quadrant tenderness to deep  palpation.  No rebound or guarding.  No palpable organomegaly, masses,  or hernias.  Normoactive bowel sounds.  EXTREMITIES:  Without cyanosis, clubbing, or edema.  NEUROLOGIC:  Alert and oriented x3.  Grossly nonfocal.   ASSESSMENT AND PLAN:  1. Left upper quadrant pain associated with gas and bloating.  Rule      out ulcer disease and gastritis secondary to NSAIDs.  Rule out      bacterial overgrowth and other causes of gas and bloating.  Begin      Prilosec 20 mg over-the-counter 1 p.o. q. a.m.  Minimize Aleve and      use Tylenol for pain instead.  Obtain a lipase today.  Risks,      benefits, and alternatives to upper endoscopy with possible biopsy      discussed with the patient.  She consents to  proceed.  This will be      scheduled electively.  2. Degenerative disease of the spine with associated left back pain      that radiates to her left side and left abdomen.  Further      management per Drs. Kriste Basque and [place].     [name], MD, [facility]  Electronically Signed    MTS/MedQ  DD: 01/05/2007  DT: 01/05/2007  Job #: [id number]   cc:   Lonzo Cloud. Kriste Basque, MD

## 2011-04-04 NOTE — Assessment & Plan Note (Signed)
[facility]                              CARDIOLOGY OFFICE NOTE   Heather, [name]                      MRN:          [id number]  DATE:07/03/2006                            DOB:          [birth year]    PRIMARY CARE PHYSICIAN:  Lonzo Cloud. Kriste Basque, M.D.   PATIENT IDENTIFICATION:  Heather [name] is a delightful 77 year old woman who  returns today for routine followup.   PAST MEDICAL HISTORY:  1. Congestive heart failure secondary to diastolic dysfunction.  2. History of presyncopal episode, resolved.  3. History of breast cancer, status post left mastectomy in 1994.  4. Hypertension.  5. Venous insufficiency.   CURRENT MEDICATIONS:  1. Multivitamin.  2. Aspirin 81 mg a day.  3. Hyzaar 100/25.   ALLERGIES:  NO KNOWN DRUG ALLERGIES.   INTERVAL HISTORY:  Heather [name] returns today for routine followup.  From a  cardiac point of view, she is doing quite well.  She denies any recurrent  syncope or presyncope.  She has not had any chest pain or shortness of  breath.  She has noticed a progressive edema since she stopped her Lasix,  but she denies orthopnea or PND.   PHYSICAL EXAMINATION:  GENERAL:  An elderly woman who looks younger than her  stated age.  Ambulates around the clinic without any difficulty.  VITAL SIGNS:  Blood pressure is 130/62 with a pulse of 89.  Her weight is  182 which is up 11 pounds from her previous visit in February.  HEENT:  Sclerae anicteric.  EOMI.  There is no xanthelasma.  Mucous  membranes are moist.  JVP is elevated to 7-or-8-cm H2O.  Carotids are 2+  bilaterally without any bruits.  There is no lymphadenopathy or thyromegaly.  CARDIAC:  Regular rate and rhythm.  No murmurs or rubs.  Plus S4.  LUNGS:  Clear.  ABDOMEN:  Soft, nontender, nondistended.  There is no hepatosplenomegaly.  No bruits.  No masses.  CHEST:  She is status post left mastectomy.  EXTREMITIES:  Warm with no cyanosis or clubbing.  There is 2+ edema  bilaterally.  NEUROLOGIC:  She is alert and oriented x3.  Cranial nerves II-XII are  intact.  Moves all four extremities without difficulty.   EKG shows a sinus rhythm at a rate of 89 beats a minute.   ASSESSMENT:  1. Fluid overload, likely secondary to combination of venous insufficiency      and diastolic dysfunction.  We will restart her on Lasix 20 mg a day      and potassium 20 mEq a day.  We will check her electrolytes next week.      I have told her that if she begins to feel lightheaded that she needs      to cut back her Lasix to every other day.  2. Hypertension, reasonably well controlled.  She will follow up with Dr.      Kriste Basque.  3. History of presyncope, resolved.  [name]. Bensimhon, MD    DRB/MedQ  DD:  07/03/2006  DT:  07/03/2006  Job #:  [id number]

## 2011-04-04 NOTE — Assessment & Plan Note (Signed)
[facility]                            CARDIOLOGY OFFICE NOTE   NAME:[name], Heather                      MRN:          [id number]  DATE:12/15/2006                            DOB:          [birth year]    PRIMARY CARE PHYSICIAN:  Heather [name].   INTERVAL HISTORY:  Heather [name] is a very pleasant, 77 year old woman with  multiple medical problems including congestive heart failure, secondary  diastolic dysfunction, and atypical chest pain with a negative Myoview  as well as a history of hypertension.  She returns today for routine  followup.   Overall, she is doing fairly well.  Her main complaint is of pain in her  left flank.  This started last week.  She said it is very positional and  it hurts when she presses on it and also hurts worse with eating.  She  denies any dysuria.  No fevers or chills.  No bright red blood per  rectum or melena.  She apparently had an urinalysis and was told it was  okay.  She denies any chest pain.  She does have chronic shortness of  breath which is unchanged.  Her lower extremity edema is much improved.  She denies orthopnea or PND.   MEDICATIONS:  1. Multivitamin.  2. Aspirin 81.  3. Lasix 20 daily.  4. Cozaar 100 daily.  5. Protonix 40 daily.  6. Coreg 3.125 b.i.d., which she is not taking because she said she      did not know what it was for.   PHYSICAL EXAM:  She is an elderly woman in no acute distress, ambulatory  in the clinic slowly without any respiratory difficulty.  Her blood pressure is 150/70.  Heart rate is 74.  HEENT:  Sclerae anicteric.  EOMI.  There is no xanthelasma's.  Mucous  membranes are moist.  Oropharynx is clear.  NECK:  Supple.  There is no JVD.  Carotids are 2+ bilaterally without  any bruits.  There is no lymphadenopathy or thyromegaly.  CARDIAC:  Regular rate and rhythm with a soft systolic murmur at the  right sternal border.  S2 is well preserved.  LUNGS:  Clear.  ABDOMEN:   Soft, nondistended.  She is mildly tender in her right upper  quadrant.  There is no guarding or rebound.  No mass.  No bruits.  There is no CVA tenderness.  EXTREMITIES:  Warm with no cyanosis or clubbing.  There is trace to 1+  edema bilaterally which is chronic.  NEUROLOGIC:  She is alert and oriented x3.  Cranial nerves II through  XII intact.  She moves all 4 extremities without difficulty.   ASSESSMENT AND PLAN:  1. Chest pain.  This is resolved.  She has a negative Myoview.  This      is unlikely to be cardiac.  Continue current therapy.  2. Lower extremity edema, due in part to diastolic dysfunction.  Her      edema is much improved.  Continue her Lasix.  She has been having      trouble taking her potassium but I  told her as long as she takes      her Lasix, she needs to be on the potassium.  3. Hypertension.  I have instructed her to resume her Coreg at 3.125      b.i.d.  She will follow up with Heather [name] for continued titration.  4. Abdominal pain.  She is scheduled to see Heather [name] tomorrow for      further evaluation.   DISPOSITION:  Return to clinic in 6 months for a routine followup.     Heather Buckles. Bensimhon, MD  Electronically Signed    DRB/MedQ  DD: 12/15/2006  DT: 12/15/2006  Job #: [id number]   cc:   Heather Cloud. Kriste Basque, MD

## 2011-04-04 NOTE — Assessment & Plan Note (Signed)
[facility]                              CARDIOLOGY OFFICE NOTE   NAME:Chacko, AMANDY                      MRN:          [id number]  DATE:09/08/2006                            DOB:          Jul 20, 1918    PRIMARY CARE PHYSICIAN:  Lonzo Cloud. Kriste Basque, M.D.   PATIENT IDENTIFICATION:  Heather [name] is a very pleasant, 77 year old, woman  who returns for followup on her recent stress test.   PAST MEDICAL HISTORY:  1. History of congestive heart failure secondary to diastolic dysfunction.  2. History of presyncopal episodes having had a 40-hour Holter monitor May      2006 which was negative.  3. Atypical chest pain, likely secondary to gastroesophageal reflux      disease with a adenosine Myoview September 02, 2006.  Ejection fraction      of 67%. No scar or ischemia.  4. History of breast cancer.  Status post left mastectomy.  5. Hypertension.  6. Arthritis.  7. Venous insufficiency.  8. Anxiety.   CURRENT MEDICATIONS:  1. Lasix 20 mg a day.  2. Potassium which she is not taking.  This is hard to swallow.  3. Cozaar 100 mg a day.  4. Aspirin 81 mg.  5. Protonix 40 mg a day.  6. Tramadol 50 mg a day.  7. Multivitamins.   INTERVAL HISTORY:  Mrs. Kettering was seen several weeks ago by Tereso Newcomer in  our clinic for atypical chest pain which sounds to me like gastroesophageal  reflux disease.  However, given her risk factors, she was sent for a  Myoview.  This was normal.  She was started on Protonix and she says she  feels much better.  She does have multiple somatic complaints including  arthritis, occasional morning for congestion and back pain.  She has some  chronic lower extremity edema but this is improved with diuretics.  She  denies any orthopnea or PND.  No further chest pain.   PHYSICAL EXAMINATION:  GENERAL:  She is an elderly woman in no acute  distress. Ambulates with a cane without any respiratory difficulty.  VITAL SIGNS: Blood  pressure 138/70, heart rate 74, weight 176.  HEENT:  Sclerae anicteric.  EOMI.  No xanthelasma.  Mucous membranes are  moist.  NECK:  Supple.  There is no JVD.  Carotids are 2+ bilaterally and no bruits.  No lymphadenopathy or thyromegaly.  CARDIAC:  He has a regular rate and rhythm with a soft brief systolic murmur  at the right sternal border.  S2 is well preserved.  LUNGS:  Clear.  ABDOMEN:  Soft, nontender, nondistended.  No hepatosplenomegaly.  No bruits.  No masses.  Good bowel sounds.  EXTREMITIES:  Warm with no cyanosis, clubbing or edema.  There is 1+ edema  bilaterally.  NEUROLOGIC:  She is alert and oriented x3.  Cranial nerves II-XII intact.  She moves all four extremities without difficulty.   LABORATORY DATA:  Sodium 140, potassium 4.2, creatinine 11, BUN 1.  BNP 91.  Hemoglobin 12.6.   ASSESSMENT/PLAN:  1. Chest pain, likely secondary to reflux disease.  Continue Protonix.      Myoview is negative.  2. Hypertension, suboptimally controlled.  We have added Coreg 3.125 mg      b.i.d.  She will followup with Dr. Kriste Basque.  3. Lower extremity edema, mostly due to venous insufficiency, much      improved with Lasix.  We have given her potassium elixir so she can      take this.   DISPOSITION:  Return to clinic in nine months.     [name]. Bensimhon, MD    DRB/MedQ  DD: 09/08/2006  DT: 09/09/2006  Job #: [id number]

## 2011-04-24 ENCOUNTER — Telehealth: Payer: Self-pay | Admitting: Pulmonary Disease

## 2011-04-24 NOTE — Telephone Encounter (Signed)
Called, spoke with pt.  States her stomach is "expanding across ways" and is swelling.  States this has been going on x 6-7 months.  Also c/o pains in the back of her stomach and loosing 10 lbs but could not tell me in what time frame this weight loose occurred.  The pain is only when she is up - it goes away when she is sitting.  Pt states she was seen by Dr. Russella Dar and was told she was "loosing muscles in her stomach."  I asked pt has she seen Dr. Randa Evens yet.  States she did see him but he took a stool speciman and was told no blood in stool.  States he did not address her stomach issues.  Pt states "there is something wrong" and wants to know what it is - ? Cancer, nodules, tumors.  Dr. Kriste Basque, pls advise.  Thanks!  Allergies verified [facility]  Allergies  Allergen Reactions  . Fentanyl     REACTION: nausea

## 2011-04-24 NOTE — Telephone Encounter (Signed)
Per SN---recs to send her to see GI at [facility].  Called and spoke with pt and she stated that she has another appt with Dr. Randa Evens on June 25.  Pt  Stated that she has a lot of gas and a lot of mucus that she keeps getting up.  Pt stated that she does not understand why she has all these doctors to follow all of her problems. Pt can not make up her mind what she wants to do so she will talk  To mike to see what he advises and she will call me back today.

## 2011-04-25 ENCOUNTER — Telehealth: Payer: Self-pay | Admitting: Pulmonary Disease

## 2011-04-25 NOTE — Telephone Encounter (Signed)
Spoke with pt.  Not clear what she needed, she was asking about appt with GI and so I verified date and time of app with Dr Donnajean Lopes with her. Pt verbalized understanding.

## 2011-04-25 NOTE — Telephone Encounter (Signed)
Pt called Heather [name] back. (same ph #) Heather [name]

## 2011-04-25 NOTE — Telephone Encounter (Signed)
Called and spoke with pt.  Pt states she does wish to be referred to GI at [facility]. Will send order to [facility].

## 2011-05-02 ENCOUNTER — Other Ambulatory Visit: Payer: Self-pay | Admitting: Gastroenterology

## 2011-05-02 ENCOUNTER — Ambulatory Visit
Admission: RE | Admit: 2011-05-02 | Discharge: 2011-05-02 | Disposition: A | Discharge status: Home / Self Care | Payer: Medicare Other | Source: Ambulatory Visit | Attending: Gastroenterology | Admitting: Gastroenterology

## 2011-05-02 DIAGNOSIS — K5900 Constipation, unspecified: Secondary | ICD-10-CM

## 2011-05-05 ENCOUNTER — Telehealth: Payer: Self-pay | Admitting: Pulmonary Disease

## 2011-05-05 NOTE — Telephone Encounter (Signed)
Pt will need to call her GI doctor--Dr. Russella Dar for the colonscopy.  thanks

## 2011-05-05 NOTE — Telephone Encounter (Signed)
Will forward to Leigh 

## 2011-05-05 NOTE — Telephone Encounter (Signed)
Spoke with the patient and she states that she needs to have a colonoscopy ordered because it has been a long time since she had one and on the last one she had they saw some polyps so pt requesting another colonoscopy. Please advise.[name], CMA

## 2011-05-05 NOTE — Telephone Encounter (Signed)
Per SN--- we are not able to fill out these forms for the pts son.  These forms will have to be filled out for the pt only.

## 2011-05-05 NOTE — Telephone Encounter (Signed)
Pt advised. Jennifer Castillo, CMA  

## 2011-05-05 NOTE — Telephone Encounter (Signed)
Pt advised. Heather [name], CMA  

## 2011-05-05 NOTE — Telephone Encounter (Signed)
[email or web address]  

## 2011-05-13 ENCOUNTER — Other Ambulatory Visit: Payer: Self-pay | Admitting: Gastroenterology

## 2011-05-16 ENCOUNTER — Ambulatory Visit
Admission: RE | Admit: 2011-05-16 | Discharge: 2011-05-16 | Disposition: A | Discharge status: Home / Self Care | Payer: Medicare Other | Source: Ambulatory Visit | Attending: Gastroenterology | Admitting: Gastroenterology

## 2011-05-19 ENCOUNTER — Telehealth: Payer: Self-pay | Admitting: Pulmonary Disease

## 2011-05-19 NOTE — Telephone Encounter (Signed)
Per SN---she will need to contact her ortho doctor for this.  She is aware that she will not get a call back until tomorrow.  She will need to either call for an appt or we can call for her.  thanks

## 2011-05-19 NOTE — Telephone Encounter (Signed)
Pt aware of SN rec and will call her ortho doctor regarding this matter.

## 2011-05-19 NOTE — Telephone Encounter (Signed)
Called and spoke with pt and she stated that she fell on Saturday --she stated that the swollen area is above her knee and she is able to bend and walk but it is painful to walk.  She stated that the area is like a bruise.  Pt stated that it is further up on her thigh.  SN please advise. thanks

## 2011-05-30 ENCOUNTER — Telehealth: Payer: Self-pay | Admitting: Pulmonary Disease

## 2011-05-30 NOTE — Telephone Encounter (Signed)
Called, spoke with pt.  States [facility] sent her a paper for her upcoming appt with them requesting she has her PCP answer the questions.  States these questions are "yes or no" regarding if she has several different medical conditions.  She will bring this to OV on July 26 with SN to address at this time.

## 2011-06-12 ENCOUNTER — Encounter: Payer: Self-pay | Admitting: Pulmonary Disease

## 2011-06-12 ENCOUNTER — Ambulatory Visit (INDEPENDENT_AMBULATORY_CARE_PROVIDER_SITE_OTHER): Payer: Medicare Other | Admitting: Pulmonary Disease

## 2011-06-12 ENCOUNTER — Other Ambulatory Visit (INDEPENDENT_AMBULATORY_CARE_PROVIDER_SITE_OTHER): Payer: Medicare Other

## 2011-06-12 DIAGNOSIS — M47812 Spondylosis without myelopathy or radiculopathy, cervical region: Secondary | ICD-10-CM

## 2011-06-12 DIAGNOSIS — I519 Heart disease, unspecified: Secondary | ICD-10-CM

## 2011-06-12 DIAGNOSIS — M545 Low back pain, unspecified: Secondary | ICD-10-CM

## 2011-06-12 DIAGNOSIS — R5381 Other malaise: Secondary | ICD-10-CM

## 2011-06-12 DIAGNOSIS — K5900 Constipation, unspecified: Secondary | ICD-10-CM

## 2011-06-12 DIAGNOSIS — I872 Venous insufficiency (chronic) (peripheral): Secondary | ICD-10-CM

## 2011-06-12 DIAGNOSIS — K5730 Diverticulosis of large intestine without perforation or abscess without bleeding: Secondary | ICD-10-CM

## 2011-06-12 DIAGNOSIS — R0602 Shortness of breath: Secondary | ICD-10-CM

## 2011-06-12 DIAGNOSIS — M1990 Unspecified osteoarthritis, unspecified site: Secondary | ICD-10-CM

## 2011-06-12 DIAGNOSIS — N281 Cyst of kidney, acquired: Secondary | ICD-10-CM

## 2011-06-12 DIAGNOSIS — K219 Gastro-esophageal reflux disease without esophagitis: Secondary | ICD-10-CM

## 2011-06-12 DIAGNOSIS — F411 Generalized anxiety disorder: Secondary | ICD-10-CM

## 2011-06-12 DIAGNOSIS — R109 Unspecified abdominal pain: Secondary | ICD-10-CM

## 2011-06-12 DIAGNOSIS — C50919 Malignant neoplasm of unspecified site of unspecified female breast: Secondary | ICD-10-CM

## 2011-06-12 DIAGNOSIS — I10 Essential (primary) hypertension: Secondary | ICD-10-CM

## 2011-06-12 DIAGNOSIS — R609 Edema, unspecified: Secondary | ICD-10-CM

## 2011-06-12 LAB — TSH: TSH: 1.99 u[IU]/mL (ref 0.35–5.50)

## 2011-06-12 LAB — BASIC METABOLIC PANEL
CO2: 27 mEq/L (ref 19–32)
Calcium: 9.2 mg/dL (ref 8.4–10.5)
Chloride: 104 mEq/L (ref 96–112)
Glucose, Bld: 102 mg/dL — ABNORMAL HIGH (ref 70–99)
Potassium: 4.6 mEq/L (ref 3.5–5.1)
Sodium: 139 mEq/L (ref 135–145)

## 2011-06-12 LAB — CBC WITH DIFFERENTIAL/PLATELET
Basophils Absolute: 0 10*3/uL (ref 0.0–0.1)
Eosinophils Absolute: 0.1 10*3/uL (ref 0.0–0.7)
Hemoglobin: 14 g/dL (ref 12.0–15.0)
Lymphocytes Relative: 43.6 % (ref 12.0–46.0)
Lymphs Abs: 3.2 10*3/uL (ref 0.7–4.0)
MCHC: 33.4 g/dL (ref 30.0–36.0)
Neutro Abs: 3.1 10*3/uL (ref 1.4–7.7)
Platelets: 246 10*3/uL (ref 150.0–400.0)
RDW: 14.4 % (ref 11.5–14.6)

## 2011-06-12 LAB — HEPATIC FUNCTION PANEL
AST: 27 U/L (ref 0–37)
Alkaline Phosphatase: 44 U/L (ref 39–117)
Bilirubin, Direct: 0.1 mg/dL (ref 0.0–0.3)
Total Protein: 7.4 g/dL (ref 6.0–8.3)

## 2011-06-12 NOTE — Progress Notes (Signed)
Subjective:    Patient ID: Heather [name], female    DOB: 1917-11-28, 77 y.o.   MRN: [id number]  HPI 77 y/o BF here for a follow up visit... she has multiple medical problems including:  Dyspnea & Anxiety;  HBP;  Hx CP followed by DrBensimhon;  HH/ Gastritis;  Divertics/ Constip;  Functional Abd Pain;  Renal cyst followed by DrKimbrough;  Hx left breast [phone];  DJD/ CSpine spondy/ LBP/ CTS; and Anxiety...  ~  December 26, 2010:  add-on appt due to right side pain x4d she says> "I feel fine, BUT..." states the right post CWP is unbearable, sharp, worse w/ movement, & denies trauma... area over the right 11-12th ribs is tender to palp & reproduces her discomfort;  chest is clear & no rubs w/ O2 sat 98% RA;  last CXR 9/11 clear, some scarring, ectatic Ao, DJD sp;  she wants to relate this all to her prev complaints of Abd pain & swelling, left flank pain, etc (recall extensive evals w/ nothing signif found to explain her varied symptoms)... ** we discussed rx w/ rest, heat, rib binder, Lidoderm patches, Vicodin Prn.  ~  February 10, 2011:  6wk ROV- she saw TP 3/16 w/ cough & mult somatic complaints trouble swallowing, throat closing, drainage, nasal congestion, "harking phlegm" etc;  CXR= stable, ectatic Ao, NAD;  Labs include BMet=wnl, BNP=110, UA=clear;  Rx w/ Zyrtek, Saline, Nasonex, Pepcid, Mucinex, & keep appt w/ DrStark for swallowing eval... Now c/o same constellation of symptoms more or less, never took the Zyrtek or Nasonex (too$$) & we discussed regimen w/ Zyrtek AM, Saline/ Mucinex during the day/ Flonase Qhs...    Dyspnea>  On Mucinex plus her anxiolytics (Alprazolam) but not taking regularly...    HBP>  On Coreg, Norvasc, Cozaar, Lasix & BP= 110/64 & well controlled, she thinks too many meds...    GI>  She has hx HH, gastritis, divertics, constip, & functional abd pain w/ mult extensive evaluations in the past;  On Omep20, Miralax, Senakot-S but she persists w/ c/o swelling & "something  wrong in my stomach it's getting bigger & bigger- I might have cancer"; last CT Abd 1/11 reviewed- divertics, otherw neg;  States her appetite is OK but weight down 12#;  C/o left side of abd hurts when she gets up to walk, no pain sitting or supine;  Offered additional meds (try Levsin or bentyl but she declines);  She has up coming appt w/ DrStark.    DJD>  Followed by DrWhitfield & she's had epid shots by DrNewton in the past    Anxiety>  On Alprazolam but she won't take it regularly for help w/ her severe anxiety & cancer phobia...  ~  June 12, 2011:  2mo ROV & she persists w/ mult somatic complaints & flight of ideas hx, "I have really suffered" she says...    GI> Hx HH, gastritis, divertics, & vague abd pains> she is not happy w/ DrStark's eval & sought second opinion (DrMann declined to see her); she saw DrJEdwards & his notes are reviewed;  Today she is quite distraught about papers to fill out for the GI dept at [facility]- DrNThorne...    Ortho> c/o pain in left leg w/ some edema (she has VI & dependent edema) & states her legs feel like lead; exam shows some incr in LE edema & we discussed incr in her Lasix from 20mg  to 40mg /d plus a 2gm sodium diet...    We rechecked her  labs today> all WNL.Marland KitchenMarland Kitchen          Problem List:  DYSPNEA (ICD-786.9) - eval 5/11 c/o SOB- can't get a DB, can't get enough air in, etc... occurs at rest & w/ exerc "I'm very active despite my arthritis", talks rapidly in long sentences w/o apparent distress... no cough, phlegm, hemoptysis, etc- but as usual she is quite distressed by these symptoms and wants further eval> we discussed re-assessment w/ CXR (clear, NAD);  PFT (totally norm airflow);  Labs (all WNL);  Rx w/ ALPRAZOLAM 0.5mg  Tid & encouraged to take regularly.  HYPERTENSION (ICD-401.9) - on AMLODIPINE 2.5mg /d,  COZAAR 50mg /d,  COREG 3.125mg Bid,  LASIX 40mg - taking 1/2tab/d because 1 tab makes her too weak... BP today= 128/66, takes meds regularly & tolerating well...  denies HA, visual changes, palipit, syncope, edema, etc... ~  7/12:  C/o incr edema in legs & advised to elim sodium from diet & incr her LASIX to 40mg /d...  Hx of CHEST PAIN (ICD-786.50) - she takes ASA 81mg /d + Tylenol Prn... followed by DrBensimhon for Cards. ~  Baseline EKG w/ RBBB & LAD, no acute changes... ~  NuclearStressTest done 2007 & 2009 showed normal- without scar or ischemia, EF=67%...   ~  2DEcho 7/08 showed mild AI, mild diastolic dysfunction, EF=55-60%. ~  CT Abd 1/09 showed incidental coronary calcif...  VENOUS INSUFFICIENCY (ICD-459.81) - she has mod VI and follows a low sodium diet, elevates legs when able, and wears support hose. ~  7/12:  C/o incr edema & we reviewed a 2gm sodium diet & increased her Lasix from 20mg /d to 40mg /d...  HIATAL HERNIA (ICD-553.3) & GASTRITIS (ICD-535.50) - on OMEPRAZOLE 20mg /d... ~  last EGD was 2/08 w/ 3cm HH & gastitis... ~  CT Abd 1/09 revealed calcif gallstones, & mild ectasia of AA... ~  CT Abd 1/11 showed left colon divertics, tiny hepatic lesions w/o change & right renal cyst stable.  DIVERTICULOSIS, COLON (ICD-562.10) - followed regularly by DrStark. ~  colonoscopy 7/03 by DrStark showing divertics only... we discussed Miralax + Senakot-S for constipation. ~  colonoscopy 2/11 showed divertics, 3mm polyp, hems...  ? of ABDOMINAL PAIN & SYMPTOMATOLOGY - she had a very thorough work up in 2009> she has a cancer phobia and is very difficult to reassure her that everything appropriate has been done & no cnacer found... ~  eval by GI- DrStark 12/08 but pt wasn't satisfied w/ his examination... ~  full labs 1/09 were WNL, sed=20... ~  CT Abd/ Pelvis 1/09= no acute abn in abd (calcif gallstones & mild ectasia of AA), calcif fibroid in the pelvis, & ?regarding the left ovary. ~  referral to DrFontaine, GYN> his notes are reviewed... ~  follow up w/ DrKimbrough, Urology> his notes are reviewed... ~  eval DrWhitfield for Orthopedics- rec shots  in back by [facility] w/ some improvement. ~  repeat GI eval 1/11 by DrStark w/ CT & colonoscpy as above- no acute problems. ~  5/11 & 7/11 >> persist complaints w/ neg recheck by DrStark & DrKimbrough... ~  12/11:  routine GYN surveillance DrFontaine found anteverted uterus w/ fibroids, atrophic ovaries bilat, no mass. ~  Repeat GI eval 4/12 by DrStark> he tried to reassure the pt, felt some symptoms might be from poor abd wall muscle tone, rec continue Omep, laxatives... ~  6/12:  She sought another GI opinion DrEdwards> he did not feel she needed any additional testing... ~  She tells me she has yet another appt to see a  gastroenterologist at [facility]...  RENAL CYST (ICD-593.2) - she has a 1 cm right renal cyst seen on CT in 2007 and followed by DrKimbrough... f/u CT Abd 1/09 - no change in the cyst... f/u renal ultrasound 1/10 w/ small bilat cysts... f/u CT Abd 1/11- no change in cyst. ~  she saw DrKimbrough 7/11 for LBP, microhematuria, urge incontinence- stable, no additional Rx. ~  she saw DrKimbrough 1/12 for urge incontinence, freq, nocturia> rec Desmopressin 0.2mg - 1/2 tab Qhs.  CARCINOMA, BREAST, LEFT (ICD-174.9) - Surgery 9/94 by DrBlievernicht w/ Left modified radical mastectomy... +estrogen receptors and Rx w/ Tamoxifen... f/u mammograms all neg... breast exam on right has been neg- no nodules palpated...  DEGENERATIVE JOINT DISEASE (ICD-715.90) - on OTC meds + VICODIN Prn... she has end-stage OA of Right Knee per DrWhitfield treated w/ shots in the past... she has a knee brace and a cane for ambulation...  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 Rx w/ rest, heat, Percocet, and f/u w/ Ortho...  Hx of LOW BACK PAIN, CHRONIC (ICD-724.2) - eval DrWhitfield w/ rec for shots by DrNewton & she's feeling sl better & wears back brace... ~  5/10: mult somatic complaints- primarily c/o left side/ postero-lat rib  pain...  Exam w/ tender left lat/ post ribs... Bone Scan= neg... ~  MRI Lumbar spine 7/10 by DrWhitfield... ~  Epidural steroid shot by DrNewton 8/10 & 1/11  CARPAL TUNNEL SYNDROME, BILATERAL (ICD-354.0) - she had prev right carpal tunnel release by DrWhitfield and was c/o increasing discomfort in her left wrist despite wrist splint Qhs- had left carpal tunnel release 1/10 by DrWhitfield...  ANXIETY (ICD-300.00) - on ALPRAZOLAM 0.5mg  Tid & encouraged to take it regularly... she has a cancer phobia & it is very difficult to reassure her regarding her symptoms and our evaluations...   Past Surgical History  Procedure Date  . Left mastectomy   . Carpal tunnel release     Outpatient Encounter Prescriptions as of 06/12/2011  Medication Sig Dispense Refill  . ALPRAZolam (XANAX) 0.5 MG tablet Take 0.5 mg by mouth 3 (three) times daily as needed.        Marland Kitchen amLODipine (NORVASC) 2.5 MG tablet Take 1 tablet (2.5 mg total) by mouth daily.  30 tablet  6  . aspirin 81 MG tablet Take 81 mg by mouth daily.        . carvedilol (COREG) 3.125 MG tablet TAKE ONE TABLET TWICE DAILY FOR BLOOD   PRESSURE  60 tablet  6  . Cholecalciferol (VITAMIN D3) 1000 UNITS tablet Take 1,000 Units by mouth daily.        Marland Kitchen desmopressin (DDAVP) 0.2 MG tablet Take 0.2 mg by mouth daily. Take 1/2 daily       . fluticasone (FLONASE) 50 MCG/ACT nasal spray 2 sprays by Nasal route at bedtime.  16 g  11  . furosemide (LASIX) 40 MG tablet Take 1 tablet (40 mg total) by mouth daily. Take 1/2 daily   30 tablet  6  . losartan (COZAAR) 50 MG tablet Take 50 mg by mouth daily.        . meclizine (ANTIVERT) 25 MG tablet Take 1/2 to 1 tablet by mouth every 4 hours as needed for dizziness  50 tablet  6  . Multiple Vitamin (MULTIVITAMIN) capsule Take 1 capsule by mouth daily.        Marland Kitchen omeprazole (PRILOSEC) 20 MG capsule Take 20 mg by mouth  daily.        . polyethylene glycol (MIRALAX / GLYCOLAX) packet Take 17 g by mouth 2 (two) times daily as  needed.        . Pseudoephedrine-Guaifenesin (MUCINEX D PO) Take by mouth. As needed       . psyllium (METAMUCIL) 58.6 % powder Take 1 packet by mouth daily.        . sennosides-docusate sodium (SENOKOT-S) 8.6-50 MG tablet Take 1 tablet by mouth daily.        . simethicone (MYLICON) 80 MG chewable tablet Chew 80 mg by mouth. Take 1 tablet in afternoon after meal       . HYDROcodone-acetaminophen (VICODIN) 5-500 MG per tablet Take 1 tablet by mouth every 8 (eight) hours as needed.          Allergies  Allergen Reactions  . Fentanyl     REACTION: nausea    Current Medications, Allergies, Past Medical History, Past Surgical History, Family History, and Social History were reviewed in [employer] record.    Review of Systems      See HPI - all other systems neg except as noted... She has mult somatic complaints and anxiety. The patient complains of decreased hearing, chest discomfort, dyspnea on exertion, abdominal pain, incontinence, muscle weakness, and difficulty walking.  The patient denies anorexia, fever, vision loss, hoarseness, syncope, peripheral edema, prolonged cough, headaches, hemoptysis, melena, hematochezia, severe indigestion/heartburn, hematuria, suspicious skin lesions, transient blindness, depression, abnormal bleeding, enlarged lymph nodes, and angioedema.     Objective:   Physical Exam   WD, WN, 77 y/o BF in NAD... she is very anxious... GENERAL:  Alert & oriented; pleasant & cooperative... HEENT:  [place]/AT, EOM-wnl, PERRLA, EACs-clear, TMs-wnl, NOSE-clear, THROAT-clear & wnl, no lesions seen. NECK:  Neck ROM decreased, no JVD; normal carotid impulses w/o bruits; no thyromegaly or nodules palpated; no lymphadenopathy. CHEST:  Clear to P & A; without wheezes/ rales/ or rhonchi heard... + tender right lat 11th & 12th ribs on palp. BREAST:  s/p left mastectomy... right breast normal- without nodules palpated... HEART:  Regular Rhythm; without murmurs/  rubs/ or gallops detected... ABDOMEN:  Soft & nontender; normal bowel sounds; no organomegaly or masses palpated... EXT: without deformities, mod arthritic changes; no varicose veins/ +venous insuffic/ tr edema. NEURO:  CN's intact; no focal neuro deficits... DERM:  No lesions noted; no rash etc...   Assessment & Plan:   Dyspnea>  She had a neg pulm eval 2011 as above; pt very anxious & Alprazolam seems to help when she takes it...  HBP>  Controlled on low doses of several meds including Coreg, Norvasc, Cozaar, & Lasix;  She is encouraged to take meds regularly & maintain regular follow up visits...  Hx CP> evaluated for Cards by DrBensimon & he has not been in favor of invasive testing; stable on conservative med rx...  VI/ Edema>  She is concerned about incr edema & we reviewed 2gm sodium diet & increased her Lasix to 40mg /d...  GI> HH, Gastritis, Divertics, vague abd pain>  I tried once again to reassure her about the thorough GI evaluations that her has undergone but she seems unconsolable...  Renal cyst>  This is benign & has been followed by drKimbrough...  Hx left Breast Cancer>  No known recurrence...  DJD> Cervical spondylosis & chronic LBP>  She has been evaluated by DrWhitfield for Ortho & given epid steroid shots by DrNewton...  Anxiety>  Asked once again to increase the alprazolam to  1/2 - 1 tab TID regularly.Marland KitchenMarland Kitchen

## 2011-06-12 NOTE — Patient Instructions (Signed)
Today we updated your med list in EPIC...    Be sure to eliminate the salt/ sodium from your diet, elevate your legs & wear support hose...    Take your LASIX/ Furosemide 40mg  - 1/2 to 1 tab each morning for the swelling...  Today we did your follow up fasting blood work...    Please call the [facility]REE in a few days for your results...    Dial [id number] & when prompted enter your patient number followed by the # symbol...    Your patient number is:  [id number]#  We will send records to DrThorne at [facility]...  Let's plan a follw up visit in 4 months.Marland KitchenMarland Kitchen

## 2011-06-19 ENCOUNTER — Telehealth: Payer: Self-pay | Admitting: Pulmonary Disease

## 2011-06-19 NOTE — Telephone Encounter (Signed)
Pt would like blood work results.  Pt stated she is also in pain and feels gassy.

## 2011-06-19 NOTE — Telephone Encounter (Signed)
lmomtcb for pt 

## 2011-06-19 NOTE — Telephone Encounter (Signed)
Called and spoke with pt and she is aware of lab results per SN.  She stated that she is having a lot of gas pains in her stomach.  Explained to the pt that she will need to make sure she takes the simethecone and omeprazole. Pt voiced her understanding of this.

## 2011-06-19 NOTE — Telephone Encounter (Signed)
PT RETURNED CALL FROM LEIGH. Heather [name]

## 2011-06-22 ENCOUNTER — Encounter: Payer: Self-pay | Admitting: Pulmonary Disease

## 2011-06-30 ENCOUNTER — Telehealth: Payer: Self-pay | Admitting: Pulmonary Disease

## 2011-06-30 NOTE — Telephone Encounter (Signed)
Pt is coming in 8/22 at 11:00 to see SN

## 2011-06-30 NOTE — Telephone Encounter (Signed)
Pt c/o a "scab" above her left ankle that keeps getting bigger and is swollen and says she has a knot on the back of her head that whe wants SN to look at. She denies any falls and says she has not hit her head. She refused an appt today with TP and soul like to be worked in this week with SN. She cannot come on Thurs., 8/16. Pls advise.

## 2011-06-30 NOTE — Telephone Encounter (Signed)
Per SN---we have no openings this week to see the pt---we can schedule an appt for next week--she may want to check with her ortho doctor as well for this place on her ankle.  thanks

## 2011-07-09 ENCOUNTER — Encounter: Payer: Self-pay | Admitting: Pulmonary Disease

## 2011-07-09 ENCOUNTER — Ambulatory Visit (INDEPENDENT_AMBULATORY_CARE_PROVIDER_SITE_OTHER): Payer: Medicare Other | Admitting: Pulmonary Disease

## 2011-07-09 VITALS — BP 110/64 | HR 76 | Temp 97.4°F | Ht 64.0 in | Wt 165.2 lb

## 2011-07-09 DIAGNOSIS — I872 Venous insufficiency (chronic) (peripheral): Secondary | ICD-10-CM

## 2011-07-09 DIAGNOSIS — M545 Low back pain, unspecified: Secondary | ICD-10-CM

## 2011-07-09 DIAGNOSIS — L259 Unspecified contact dermatitis, unspecified cause: Secondary | ICD-10-CM

## 2011-07-09 DIAGNOSIS — I10 Essential (primary) hypertension: Secondary | ICD-10-CM

## 2011-07-09 DIAGNOSIS — M47812 Spondylosis without myelopathy or radiculopathy, cervical region: Secondary | ICD-10-CM

## 2011-07-09 DIAGNOSIS — L309 Dermatitis, unspecified: Secondary | ICD-10-CM

## 2011-07-09 DIAGNOSIS — R5383 Other fatigue: Secondary | ICD-10-CM

## 2011-07-09 DIAGNOSIS — F411 Generalized anxiety disorder: Secondary | ICD-10-CM

## 2011-07-09 DIAGNOSIS — M1990 Unspecified osteoarthritis, unspecified site: Secondary | ICD-10-CM

## 2011-07-09 DIAGNOSIS — R5381 Other malaise: Secondary | ICD-10-CM

## 2011-07-09 MED ORDER — FLUOCINONIDE 0.05 % EX CREA: TOPICAL_CREAM | CUTANEOUS | Status: DC

## 2011-07-09 NOTE — Progress Notes (Signed)
Subjective:    Patient ID: Heather [name], female    DOB: [birth year], 77 y.o.   MRN: [id number]  HPI 77 y/o BF here for a follow up visit... she has multiple medical problems including:  Dyspnea & Anxiety;  HBP;  Hx CP followed by DrBensimhon;  HH/ Gastritis;  Divertics/ Constip;  Functional Abd Pain;  Renal cyst followed by DrKimbrough;  Hx left breast [phone];  DJD/ CSpine spondy/ LBP/ CTS; and Anxiety...  ~  December 26, 2010:  add-on appt due to right side pain x4d she says> "I feel fine, BUT..." states the right post CWP is unbearable, sharp, worse w/ movement, & denies trauma... area over the right 11-12th ribs is tender to palp & reproduces her discomfort;  chest is clear & no rubs w/ O2 sat 98% RA;  last CXR 9/11 clear, some scarring, ectatic Ao, DJD sp;  she wants to relate this all to her prev complaints of Abd pain & swelling, left flank pain, etc (recall extensive evals w/ nothing signif found to explain her varied symptoms)... ** we discussed rx w/ rest, heat, rib binder, Lidoderm patches, Vicodin Prn.  ~  February 10, 2011:  6wk ROV- she saw TP 3/16 w/ cough & mult somatic complaints trouble swallowing, throat closing, drainage, nasal congestion, "harking phlegm" etc;  CXR= stable, ectatic Ao, NAD;  Labs include BMet=wnl, BNP=110, UA=clear;  Rx w/ Zyrtek, Saline, Nasonex, Pepcid, Mucinex, & keep appt w/ DrStark for swallowing eval... Now c/o same constellation of symptoms more or less, never took the Zyrtek or Nasonex (too$$) & we discussed regimen w/ Zyrtek AM, Saline/ Mucinex during the day/ Flonase Qhs...    Dyspnea>  On Mucinex plus her anxiolytics (Alprazolam) but not taking regularly...    HBP>  On Coreg, Norvasc, Cozaar, Lasix & BP= 110/64 & well controlled, she thinks too many meds...    GI>  She has hx HH, gastritis, divertics, constip, & functional abd pain w/ mult extensive evaluations in the past;  On Omep20, Miralax, Senakot-S but she persists w/ c/o swelling & "something  wrong in my stomach it's getting bigger & bigger- I might have cancer"; last CT Abd 1/11 reviewed- divertics, otherw neg;  States her appetite is OK but weight down 12#;  C/o left side of abd hurts when she gets up to walk, no pain sitting or supine;  Offered additional meds (try Levsin or bentyl but she declines);  She has up coming appt w/ DrStark.    DJD>  Followed by DrWhitfield & she's had epid shots by DrNewton in the past    Anxiety>  On Alprazolam but she won't take it regularly for help w/ her severe anxiety & cancer phobia...  ~  June 12, 2011:  23mo ROV & she persists w/ mult somatic complaints & flight of ideas hx, "I have really suffered" she says...    GI> Hx HH, gastritis, divertics, & vague abd pains> she is not happy w/ DrStark's eval & sought second opinion (DrMann declined to see her); she saw DrJEdwards & his notes are reviewed;  Today she is quite distraught about papers to fill out for the GI dept at [facility]- DrNThorne...    Ortho> c/o pain in left leg w/ some edema (she has VI & dependent edema) & states her legs feel like lead; exam shows some incr in LE edema & we discussed incr in her Lasix from 20mg  to 40mg /d plus a 2gm sodium diet...    We rechecked her  labs today> all WNL.Marland Kitchen.  ~  July 09, 2011:  64mo ROV & add-on due to sm area of dermatitis on her left leg, sl excoriated & sm scab==> her son is here today & told me she drove him crazy w/ her ruminations about this being a cancer & that we would send her to the cancer center, etc (she got angry at him for telling me); we discussed dermatitis & rec RX w/ LIDEX-E cream to apply Bid & rec Derm review if not resolved...    She is also quite concerned about a "bump" on the back of her head==> occipital bone & it's symmetric, she is reassured...    She has been to [facility] GI Dept DrThorne & note is pending but she has lab sheet (all wnl) & is sch for EGD 9/12; they told her to stop Metamucil, and take Miralax, Senakot; she is most concerned/  upset by an evaluation form she received asking questions like how long she waited for this or that during her visit & she doesn't know what to do or how to complete the info! I encouraged her to take her Alpraz regularly Tid...          Problem List:  DYSPNEA (ICD-786.9) - eval 5/11 c/o SOB- can't get a DB, can't get enough air in, etc... occurs at rest & w/ exerc "I'm very active despite my arthritis", talks rapidly in long sentences w/o apparent distress... no cough, phlegm, hemoptysis, etc- but as usual she is quite distressed by these symptoms and wants further eval> we discussed re-assessment w/ CXR (clear, NAD);  PFT (totally norm airflow);  Labs (all WNL);  Rx w/ ALPRAZOLAM 0.5mg  Tid & encouraged to take regularly.  HYPERTENSION (ICD-401.9) - on AMLODIPINE 2.5mg /d,  COZAAR 50mg /d,  COREG 3.125mg Bid,  LASIX 40mg - taking 1/2tab/d because 1 tab makes her too weak... BP today= 110/64, takes meds regularly & tolerating well... denies HA, visual changes, palipit, syncope, edema, etc... ~  7/12:  C/o incr edema in legs & advised to elim sodium from diet & incr her LASIX to 40mg /d...  Hx of CHEST PAIN (ICD-786.50) - she takes ASA 81mg /d + Tylenol Prn... followed by DrBensimhon for Cards. ~  Baseline EKG w/ RBBB & LAD, no acute changes... ~  NuclearStressTest done 2007 & 2009 showed normal- without scar or ischemia, EF=67%...   ~  2DEcho 7/08 showed mild AI, mild diastolic dysfunction, EF=55-60%. ~  CT Abd 1/09 showed incidental coronary calcif...  VENOUS INSUFFICIENCY (ICD-459.81) - she has mod VI and follows a low sodium diet, elevates legs when able, and wears support hose. ~  7/12:  C/o incr edema & we reviewed a 2gm sodium diet & increased her Lasix from 20mg /d to 40mg /d...  HIATAL HERNIA (ICD-553.3) & GASTRITIS (ICD-535.50) - on OMEPRAZOLE 20mg /d... ~  last EGD was 2/08 w/ 3cm HH & gastitis... ~  CT Abd 1/09 revealed calcif gallstones, & mild ectasia of AA... ~  CT Abd 1/11 showed left  colon divertics, tiny hepatic lesions w/o change & right renal cyst stable.  DIVERTICULOSIS, COLON (ICD-562.10) - followed regularly by DrStark. ~  colonoscopy 7/03 by DrStark showing divertics only... we discussed Miralax + Senakot-S for constipation. ~  colonoscopy 2/11 showed divertics, 3mm polyp, hems...  ? of ABDOMINAL PAIN & SYMPTOMATOLOGY - she had a very thorough work up in 2009> she has a cancer phobia and is very difficult to reassure her that everything appropriate has been done & no cnacer found... ~  eval  by GI- DrStark 12/08 but pt wasn't satisfied w/ his examination... ~  full labs 1/09 were WNL, sed=20... ~  CT Abd/ Pelvis 1/09= no acute abn in abd (calcif gallstones & mild ectasia of AA), calcif fibroid in the pelvis, & ?regarding the left ovary. ~  referral to DrFontaine, GYN> his notes are reviewed... ~  follow up w/ DrKimbrough, Urology> his notes are reviewed... ~  eval DrWhitfield for Orthopedics- rec shots in back by [facility] w/ some improvement. ~  repeat GI eval 1/11 by DrStark w/ CT & colonoscpy as above- no acute problems. ~  5/11 & 7/11 >> persist complaints w/ neg recheck by DrStark & DrKimbrough... ~  12/11:  routine GYN surveillance DrFontaine found anteverted uterus w/ fibroids, atrophic ovaries bilat, no mass. ~  Repeat GI eval 4/12 by DrStark> he tried to reassure the pt, felt some symptoms might be from poor abd wall muscle tone, rec continue Omep, laxatives... ~  6/12:  She sought another GI opinion DrEdwards> he did not feel she needed any additional testing... ~  She tells me she has yet another appt to see a gastroenterologist at [facility]==> seen 8/12 & note pending.  RENAL CYST (ICD-593.2) - she has a 1 cm right renal cyst seen on CT in 2007 and followed by DrKimbrough... f/u CT Abd 1/09 - no change in the cyst... f/u renal ultrasound 1/10 w/ small bilat cysts... f/u CT Abd 1/11- no change in cyst. ~  she saw DrKimbrough 7/11 for LBP, microhematuria, urge  incontinence- stable, no additional Rx. ~  she saw DrKimbrough 1/12 for urge incontinence, freq, nocturia> rec Desmopressin 0.2mg - 1/2 tab Qhs.  CARCINOMA, BREAST, LEFT (ICD-174.9) - Surgery 9/94 by DrBlievernicht w/ Left modified radical mastectomy... +estrogen receptors and Rx w/ Tamoxifen... f/u mammograms all neg... breast exam on right has been neg- no nodules palpated...  DEGENERATIVE JOINT DISEASE (ICD-715.90) - on OTC meds + VICODIN Prn... she has end-stage OA of Right Knee per DrWhitfield treated w/ shots in the past... she has a knee brace and a cane for ambulation...  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 Rx w/ rest, heat, Percocet, and f/u w/ Ortho...  Hx of LOW BACK PAIN, CHRONIC (ICD-724.2) - eval DrWhitfield w/ rec for shots by DrNewton & she's feeling sl better & wears back brace... ~  5/10: mult somatic complaints- primarily c/o left side/ postero-lat rib pain...  Exam w/ tender left lat/ post ribs... Bone Scan= neg... ~  MRI Lumbar spine 7/10 by DrWhitfield... ~  Epidural steroid shot by DrNewton 8/10 & 1/11  CARPAL TUNNEL SYNDROME, BILATERAL (ICD-354.0) - she had prev right carpal tunnel release by DrWhitfield and was c/o increasing discomfort in her left wrist despite wrist splint Qhs- had left carpal tunnel release 1/10 by DrWhitfield...  ANXIETY (ICD-300.00) - on ALPRAZOLAM 0.5mg  Tid & encouraged to take it regularly... she has a cancer phobia & it is very difficult to reassure her regarding her symptoms and our evaluations...   Past Surgical History  Procedure Date  . Left mastectomy   . Carpal tunnel release     Outpatient Encounter Prescriptions as of 07/09/2011  Medication Sig Dispense Refill  . ALPRAZolam (XANAX) 0.5 MG tablet Take 0.5 mg by mouth 3 (three) times daily as needed.        Marland Kitchen amLODipine (NORVASC) 2.5 MG tablet Take 1 tablet (2.5 mg total) by mouth daily.  30 tablet   6  .  aspirin 81 MG tablet Take 81 mg by mouth daily.        . carvedilol (COREG) 3.125 MG tablet TAKE ONE TABLET TWICE DAILY FOR BLOOD   PRESSURE  60 tablet  6  . Cholecalciferol (VITAMIN D3) 1000 UNITS tablet Take 1,000 Units by mouth daily.        Marland Kitchen desmopressin (DDAVP) 0.2 MG tablet Take 0.2 mg by mouth daily. Take 1/2 daily       . furosemide (LASIX) 40 MG tablet Take 1/2  To 1 tablet by mouth in the morning for swelling        . HYDROcodone-acetaminophen (VICODIN) 5-500 MG per tablet Take 1 tablet by mouth every 8 (eight) hours as needed.        Marland Kitchen losartan (COZAAR) 50 MG tablet Take 50 mg by mouth daily.        . Multiple Vitamin (MULTIVITAMIN) capsule Take 1 capsule by mouth daily.        Marland Kitchen omeprazole (PRILOSEC) 20 MG capsule Take 20 mg by mouth daily.        . polyethylene glycol (MIRALAX / GLYCOLAX) packet Take 17 g by mouth 2 (two) times daily as needed.        . Pseudoephedrine-Guaifenesin (MUCINEX D PO) Take by mouth. As needed       . sennosides-docusate sodium (SENOKOT-S) 8.6-50 MG tablet Take 1 tablet by mouth daily.        . simethicone (MYLICON) 80 MG chewable tablet Chew 80 mg by mouth. Take 1 tablet in afternoon after meal       . fluocinonide (LIDEX) 0.05 % cream Apply to affected area  Two times daily as directed  60 g  5  . fluticasone (FLONASE) 50 MCG/ACT nasal spray 2 sprays by Nasal route at bedtime.  16 g  11  . meclizine (ANTIVERT) 25 MG tablet Take 1/2 to 1 tablet by mouth every 4 hours as needed for dizziness  50 tablet  6    Allergies  Allergen Reactions  . Fentanyl     REACTION: nausea    Current Medications, Allergies, Past Medical History, Past Surgical History, Family History, and Social History were reviewed in [employer] record.    Review of Systems      See HPI - all other systems neg except as noted... She has mult somatic complaints and anxiety. The patient complains of decreased hearing, chest discomfort, dyspnea on  exertion, abdominal pain, incontinence, muscle weakness, and difficulty walking.  The patient denies anorexia, fever, vision loss, hoarseness, syncope, peripheral edema, prolonged cough, headaches, hemoptysis, melena, hematochezia, severe indigestion/heartburn, hematuria, transient blindness, depression, abnormal bleeding, enlarged lymph nodes, and angioedema.     Objective:   Physical Exam   WD, WN, 77 y/o BF in NAD... she is very anxious... GENERAL:  Alert & oriented; pleasant & cooperative... HEENT:  Sims/AT, EOM-wnl, PERRLA, EACs-clear, TMs-wnl, NOSE-clear, THROAT-clear & wnl, no lesions seen. NECK:  Neck ROM decreased, no JVD; normal carotid impulses w/o bruits; no thyromegaly or nodules palpated; no lymphadenopathy. CHEST:  Clear to P & A; without wheezes/ rales/ or rhonchi heard... + tender right lat 11th & 12th ribs on palp. BREAST:  s/p left mastectomy... right breast normal- without nodules palpated... HEART:  Regular Rhythm; without murmurs/ rubs/ or gallops detected... ABDOMEN:  Soft & nontender; normal bowel sounds; no organomegaly or masses palpated... EXT: without deformities, mod arthritic changes; no varicose veins/ +venous insuffic/ tr edema. NEURO:  CN's intact; no focal neuro  deficits... DERM:  Small sl excoriated area of dermatitis left shin- no drainage,no rash, etc...   Assessment & Plan:   DERMATITIS> ?Etiology but looks benign, no drainage, sl scab over area lower left shin; REC> Lidex E cream Bid & if unresolved- to derm...  Dyspnea>  She had a neg pulm eval 2011 as above; pt very anxious & Alprazolam seems to help when she takes it...  HBP>  Controlled on low doses of several meds including Coreg, Norvasc, Cozaar, & Lasix;  She is encouraged to take meds regularly & maintain regular follow up visits...  Hx CP> evaluated for Cards by DrBensimon & he has not been in favor of invasive testing; stable on conservative med rx...  VI/ Edema>  She is concerned about incr  edema & we reviewed 2gm sodium diet & rec keep Lasix at 40mg /d...  GI> HH, Gastritis, Divertics, vague abd pain>  I tried once again to reassure her about the thorough GI evaluations that her has undergone but she seems unconsolable...  Renal cyst>  This is benign & has been followed by DrKimbrough...  Hx left Breast Cancer>  No known recurrence...  DJD> Cervical spondylosis & chronic LBP>  She has been evaluated by DrWhitfield for Ortho & given epid steroid shots by DrNewton...  Anxiety>  Asked once again to increase the alprazolam to 1/2 - 1 tab TID regularly.Marland KitchenMarland Kitchen

## 2011-07-09 NOTE — Patient Instructions (Signed)
Today we updated your med list in EPIC...    We wrote a new prescription for Generic LIDEX-E cream to apply to your leg lesion twice daily...  If the are area doesn't resolve then we will send you to a Dermatologist for review...  Call for any questions.Marland KitchenMarland Kitchen

## 2011-07-16 ENCOUNTER — Telehealth: Payer: Self-pay | Admitting: Pulmonary Disease

## 2011-07-16 DIAGNOSIS — R21 Rash and other nonspecific skin eruption: Secondary | ICD-10-CM

## 2011-07-16 NOTE — Telephone Encounter (Signed)
Order has been sent for eval from dermatology per SN----attempted to call pt but line is busy.

## 2011-07-16 NOTE — Telephone Encounter (Signed)
Spoke with the pt and she states the lesion on her left leg is not getting any better. She states the swelling is worse. Per last OV note Sn states that if it does not improve then he will do a dermatologist referral. Pt request this be done asap. Please advise on what doctor to refer to. Thanks. [name], CMA

## 2011-07-17 NOTE — Telephone Encounter (Signed)
Spoke with pt and she states she already has OV set up for 07/23/11 with dermatology

## 2011-07-23 ENCOUNTER — Other Ambulatory Visit: Payer: Self-pay | Admitting: Dermatology

## 2011-08-08 ENCOUNTER — Telehealth: Payer: Self-pay | Admitting: Pulmonary Disease

## 2011-08-08 MED ORDER — HYDROCODONE-ACETAMINOPHEN 5-500 MG PO TABS: 1.0000 | ORAL_TABLET | Freq: Three times a day (TID) | ORAL | Status: DC | PRN

## 2011-08-08 NOTE — Telephone Encounter (Signed)
Pt requesting a refill on vicodin. Last fill was on 01-22-11 #90 x5 refills. Refill sent. Pt aware. [name], CMA

## 2011-08-11 ENCOUNTER — Other Ambulatory Visit: Payer: Self-pay | Admitting: *Deleted

## 2011-08-11 ENCOUNTER — Telehealth: Payer: Self-pay | Admitting: Pulmonary Disease

## 2011-08-11 MED ORDER — HYDROCODONE-ACETAMINOPHEN 5-500 MG PO TABS: 1.0000 | ORAL_TABLET | Freq: Three times a day (TID) | ORAL | Status: DC | PRN

## 2011-08-11 NOTE — Telephone Encounter (Signed)
I spoke with pt and she states she has have been pain in the bottom of her stomach. Pt states she called Dr. Lorri Frederick and they advised her that she needed to call her pcp and make an apt. Pt refused apt with TP and wants an OV with SN. Pt states she is in a lot of pain and wants an apt ASAP. Pt states she has been taking vicodin. Pt also states she did "some type of test" at [facility] and states they were going to send a report to Dr. Kriste Basque. Pt did not know the name of the test.  Please advise if you have received the report. Thanks  Carver Fila, CMA

## 2011-08-11 NOTE — Telephone Encounter (Signed)
Per SN---there is nothing else that SN can do for her.   She will need to be under the care of a GI specialist.  Keep her regularly scheduled appt with SN.  thanks

## 2011-08-11 NOTE — Telephone Encounter (Signed)
Spoke with pt and notified of recs per SN. She verbalized understanding and states nothing further needed.  

## 2011-08-12 ENCOUNTER — Encounter: Payer: Self-pay | Admitting: Pulmonary Disease

## 2011-08-16 ENCOUNTER — Emergency Department (HOSPITAL_COMMUNITY): Payer: Medicare Other

## 2011-08-16 ENCOUNTER — Emergency Department (HOSPITAL_COMMUNITY)
Admission: EM | Admit: 2011-08-16 | Discharge: 2011-08-17 | Disposition: A | Discharge status: Home / Self Care | Payer: Medicare Other | Attending: Emergency Medicine | Admitting: Emergency Medicine

## 2011-08-16 DIAGNOSIS — I10 Essential (primary) hypertension: Secondary | ICD-10-CM | POA: Insufficient documentation

## 2011-08-16 DIAGNOSIS — Z79899 Other long term (current) drug therapy: Secondary | ICD-10-CM | POA: Insufficient documentation

## 2011-08-16 DIAGNOSIS — R109 Unspecified abdominal pain: Secondary | ICD-10-CM | POA: Insufficient documentation

## 2011-08-16 DIAGNOSIS — K5730 Diverticulosis of large intestine without perforation or abscess without bleeding: Secondary | ICD-10-CM | POA: Insufficient documentation

## 2011-08-16 DIAGNOSIS — Z853 Personal history of malignant neoplasm of breast: Secondary | ICD-10-CM | POA: Insufficient documentation

## 2011-08-16 LAB — CBC
MCH: 30.4 pg (ref 26.0–34.0)
MCHC: 33.3 g/dL (ref 30.0–36.0)
Platelets: 256 10*3/uL (ref 150–400)
RDW: 13.4 % (ref 11.5–15.5)

## 2011-08-16 LAB — URINALYSIS, ROUTINE W REFLEX MICROSCOPIC
Glucose, UA: NEGATIVE mg/dL
Specific Gravity, Urine: 1.008 (ref 1.005–1.030)
Urobilinogen, UA: 0.2 mg/dL (ref 0.0–1.0)

## 2011-08-16 LAB — DIFFERENTIAL
Basophils Relative: 0 % (ref 0–1)
Eosinophils Absolute: 0 10*3/uL (ref 0.0–0.7)
Monocytes Absolute: 0.9 10*3/uL (ref 0.1–1.0)
Monocytes Relative: 12 % (ref 3–12)
Neutrophils Relative %: 50 % (ref 43–77)

## 2011-08-16 LAB — COMPREHENSIVE METABOLIC PANEL
ALT: 15 U/L (ref 0–35)
AST: 20 U/L (ref 0–37)
Albumin: 3.5 g/dL (ref 3.5–5.2)
Calcium: 9.2 mg/dL (ref 8.4–10.5)
GFR calc Af Amer: >60 mL/min (ref >60)
Sodium: 138 mEq/L (ref 135–145)
Total Protein: 6.9 g/dL (ref 6.0–8.3)

## 2011-08-16 LAB — URINE MICROSCOPIC-ADD ON

## 2011-08-16 MED ORDER — IOHEXOL 300 MG/ML  SOLN
100.0000 mL | Freq: Once | INTRAMUSCULAR | Status: AC | PRN
  Administered 2011-08-16: 100 mL via INTRAVENOUS

## 2011-08-26 ENCOUNTER — Other Ambulatory Visit: Payer: Self-pay | Admitting: *Deleted

## 2011-08-26 MED ORDER — ALPRAZOLAM 0.5 MG PO TABS: 0.5000 mg | ORAL_TABLET | Freq: Three times a day (TID) | ORAL | Status: DC | PRN

## 2011-09-11 ENCOUNTER — Emergency Department (HOSPITAL_COMMUNITY)
Admission: EM | Admit: 2011-09-11 | Discharge: 2011-09-11 | Disposition: A | Discharge status: Home / Self Care | Payer: Medicare Other | Attending: Emergency Medicine | Admitting: Emergency Medicine

## 2011-09-11 DIAGNOSIS — T783XXA Angioneurotic edema, initial encounter: Secondary | ICD-10-CM | POA: Insufficient documentation

## 2011-09-11 DIAGNOSIS — T46905A Adverse effect of unspecified agents primarily affecting the cardiovascular system, initial encounter: Secondary | ICD-10-CM | POA: Insufficient documentation

## 2011-09-15 ENCOUNTER — Telehealth: Payer: Self-pay | Admitting: Pulmonary Disease

## 2011-09-15 ENCOUNTER — Other Ambulatory Visit: Payer: Self-pay | Admitting: Orthopaedic Surgery

## 2011-09-15 DIAGNOSIS — M545 Low back pain, unspecified: Secondary | ICD-10-CM

## 2011-09-15 DIAGNOSIS — H6121 Impacted cerumen, right ear: Secondary | ICD-10-CM

## 2011-09-15 NOTE — Telephone Encounter (Signed)
Called and spoke with pt and she stated that she was seen in the ER last week and they told her that her right ear is stopped up with wax and that she will need this cleaned out.  Pt is requesting to get appt with ENT that she has seen in the past but pt could not remember the name of the doctor.  Ok per SN to set up appt with ENT for this problem and for eval of her throat.  Pt stated that her tongue was swollen some when she was in the ER.  Pt is aware that we will call her once this appt has been made.

## 2011-09-16 ENCOUNTER — Ambulatory Visit
Admission: RE | Admit: 2011-09-16 | Discharge: 2011-09-16 | Disposition: A | Discharge status: Home / Self Care | Payer: Medicare Other | Source: Ambulatory Visit | Attending: Orthopaedic Surgery | Admitting: Orthopaedic Surgery

## 2011-09-16 DIAGNOSIS — M545 Low back pain, unspecified: Secondary | ICD-10-CM

## 2011-09-22 ENCOUNTER — Ambulatory Visit (INDEPENDENT_AMBULATORY_CARE_PROVIDER_SITE_OTHER): Payer: Medicare Other | Admitting: Pulmonary Disease

## 2011-09-22 ENCOUNTER — Encounter: Payer: Self-pay | Admitting: Pulmonary Disease

## 2011-09-22 DIAGNOSIS — M545 Low back pain, unspecified: Secondary | ICD-10-CM

## 2011-09-22 DIAGNOSIS — M47812 Spondylosis without myelopathy or radiculopathy, cervical region: Secondary | ICD-10-CM

## 2011-09-22 DIAGNOSIS — F411 Generalized anxiety disorder: Secondary | ICD-10-CM

## 2011-09-22 DIAGNOSIS — I10 Essential (primary) hypertension: Secondary | ICD-10-CM

## 2011-09-22 DIAGNOSIS — I519 Heart disease, unspecified: Secondary | ICD-10-CM

## 2011-09-22 DIAGNOSIS — M1990 Unspecified osteoarthritis, unspecified site: Secondary | ICD-10-CM

## 2011-09-22 DIAGNOSIS — I872 Venous insufficiency (chronic) (peripheral): Secondary | ICD-10-CM

## 2011-09-22 DIAGNOSIS — K5730 Diverticulosis of large intestine without perforation or abscess without bleeding: Secondary | ICD-10-CM

## 2011-09-22 DIAGNOSIS — K219 Gastro-esophageal reflux disease without esophagitis: Secondary | ICD-10-CM

## 2011-09-22 DIAGNOSIS — K5900 Constipation, unspecified: Secondary | ICD-10-CM

## 2011-09-22 DIAGNOSIS — C50919 Malignant neoplasm of unspecified site of unspecified female breast: Secondary | ICD-10-CM

## 2011-09-22 DIAGNOSIS — R109 Unspecified abdominal pain: Secondary | ICD-10-CM

## 2011-09-22 NOTE — Patient Instructions (Signed)
Today we updated your med list in our EPIC system...    Continue your current medications the same...    Be sure to try BOOST for your appetite & nutrition (to gain some weight)>        Start 1/2 can 4 times daily...  Your BP looks great off the Cozaar; please continue the Amlodipine, Coreg, & Lasix the same...  Continue your follow up visits w/ [facility]- GI dept and DrJEdwards at [facility], GI...  Let's plan a follow up visit w/ FASTING blood work in 3-4 months.Marland KitchenMarland Kitchen

## 2011-09-22 NOTE — Progress Notes (Signed)
Subjective:    Patient ID: Heather [name], female    DOB: 1918-05-12, 77 y.o.   MRN: [id number]  HPI 77 y/o BF here for a follow up visit... she has multiple medical problems including:  Dyspnea & Anxiety;  HBP;  Hx CP followed by DrBensimhon;  HH/ Gastritis;  Divertics/ Constip;  Functional Abd Pain;  Renal cyst followed by DrKimbrough;  Hx left breast [phone];  DJD/ CSpine spondy/ LBP/ CTS; and Anxiety...  ~  December 26, 2010:  add-on appt due to right side pain x4d she says> "I feel fine, BUT..." states the right post CWP is unbearable, sharp, worse w/ movement, & denies trauma... area over the right 11-12th ribs is tender to palp & reproduces her discomfort;  chest is clear & no rubs w/ O2 sat 98% RA;  last CXR 9/11 clear, some scarring, ectatic Ao, DJD sp;  she wants to relate this all to her prev complaints of Abd pain & swelling, left flank pain, etc (recall extensive evals w/ nothing signif found to explain her varied symptoms)... ** we discussed rx w/ rest, heat, rib binder, Lidoderm patches, Vicodin Prn.  ~  February 10, 2011:  6wk ROV- she saw TP 3/16 w/ cough & mult somatic complaints trouble swallowing, throat closing, drainage, nasal congestion, "harking phlegm" etc;  CXR= stable, ectatic Ao, NAD;  Labs include BMet=wnl, BNP=110, UA=clear;  Rx w/ Zyrtek, Saline, Nasonex, Pepcid, Mucinex, & keep appt w/ DrStark for swallowing eval... Now c/o same constellation of symptoms more or less, never took the Zyrtek or Nasonex (too$$) & we discussed regimen w/ Zyrtek AM, Saline/ Mucinex during the day/ Flonase Qhs...    Dyspnea>  On Mucinex plus her anxiolytics (Alprazolam) but not taking regularly...    HBP>  On Coreg, Norvasc, Cozaar, Lasix & BP= 110/64 & well controlled, she thinks too many meds...    GI>  She has hx HH, gastritis, divertics, constip, & functional abd pain w/ mult extensive evaluations in the past;  On Omep20, Miralax, Senakot-S but she persists w/ c/o swelling & "something  wrong in my stomach it's getting bigger & bigger- I might have cancer"; last CT Abd 1/11 reviewed- divertics, otherw neg;  States her appetite is OK but weight down 12#;  C/o left side of abd hurts when she gets up to walk, no pain sitting or supine;  Offered additional meds (try Levsin or bentyl but she declines);  She has up coming appt w/ DrStark.    DJD>  Followed by DrWhitfield & she's had epid shots by DrNewton in the past    Anxiety>  On Alprazolam but she won't take it regularly for help w/ her severe anxiety & cancer phobia...  ~  June 12, 2011:  36mo ROV & she persists w/ mult somatic complaints & flight of ideas hx, "I have really suffered" she says...    GI> Hx HH, gastritis, divertics, & vague abd pains> she is not happy w/ DrStark's eval & sought second opinion (DrMann declined to see her); she saw DrJEdwards & his notes are reviewed;  Today she is quite distraught about papers to fill out for the GI dept at [facility]- DrNThorne...    Ortho> c/o pain in left leg w/ some edema (she has VI & dependent edema) & states her legs feel like lead; exam shows some incr in LE edema & we discussed incr in her Lasix from 20mg  to 40mg /d plus a 2gm sodium diet...    We rechecked her  labs today> all WNL.Marland Kitchen.  ~  July 09, 2011:  24mo ROV & add-on due to sm area of dermatitis on her left leg, sl excoriated & sm scab==> her son is here today & told me she drove him crazy w/ her ruminations about this being a cancer & that we would send her to the cancer center, etc (she got angry at him for telling me); we discussed dermatitis & rec RX w/ LIDEX-E cream to apply Bid & rec Derm review if not resolved...    She is also quite concerned about a "bump" on the back of her head==> occipital bone & it's symmetric, she is reassured...    She has been to [facility] DrThorne & note is pending but she has lab sheet (all wnl) & is sch for EGD 9/12; they told her to stop Metamucil, and take Miralax, Senakot; she is most concerned/  upset by an evaluation form she received asking questions like how long she waited for this or that during her visit & she doesn't know what to do or how to complete the info! I encouraged her to take her Alpraz regularly Tid...  ~  September 22, 2011:  2.88mo ROV & she states "terrible" c/o phlegm, swelling, feeling bad, wt loss, "this throat business- they found white balls"; very anxious w/ mult somatic complaints >>    HBP> on Norvasc2.5, Coreg3.125Bid, Losartan50, Lasix20-40/d; she went to the ER w/ ?angioedema of the tongue, seen by [facility] who called me & we decided to stop her Cozaar; BP today off Cozaar & on the other 3 meds showed 122/72> rec to continue the same...    GI> she saw [facility] at [facility]12 for a comprehensive GI eval> note reviewed- they did an EGD 08/06/11 w/ mult white nodular plaques seen in cardia & fundus- Bx'd & pending (she is very concerned);  she went to the ER 08/16/11 w/ abd discomfort- the same vague left side abd discomfort that she has seen mult physicians about- Exam showed mild diffuse tenderness;  they did CT Abd showing divertics, no inflamm, right renal & left hep lobe cysts, atherosclerotic changes, NAD;  Labs & urine were essentially WNL;  EKG showed NSR, RBBB & LAD, no acute changes...  she saw DrEdwards [facility]) 10/12 w/ mult complaints including "a knot on her skull & she feels that her brains are coming out"; he thought she was doing reasonably well from the GI standpoint w/ her dysphagia, vague abd pain, & constip...    Ortho> she saw DrWhitfield w/ MRI Lumbar spine 10/12 showing degenerative changes w/ mild progression since 7/10 MRI, mild-mod sp stenosis & lat recess stenosis at several levels; she has Vicodin to take up to 3/d as needed for pain; she is anxiously awaiting f/u visit w/ DrWhitfield...    Derm> DrLupton saw pt 07/23/11 w/ removal of sm squamous cell ca (in situ) from left lower leg          Problem List:  DYSPNEA (ICD-786.9) - eval 5/11 c/o  SOB- can't get a DB, can't get enough air in, etc... occurs at rest & w/ exerc "I'm very active despite my arthritis", talks rapidly in long sentences w/o apparent distress... no cough, phlegm, hemoptysis, etc- but as usual she is quite distressed by these symptoms and wants further eval> we discussed re-assessment w/ CXR (clear, NAD);  PFT (totally norm airflow);  Labs (all WNL);  Rx w/ ALPRAZOLAM 0.5mg  Tid & encouraged to take regularly.  HYPERTENSION (  ICD-401.9) - on AMLODIPINE 2.5mg /d,  COREG 3.125mg Bid,  LASIX 40mg - taking 1/2tab/d because 1 tab makes her too weak... BP today= 122/72, takes meds regularly & tolerating well... denies HA, visual changes, palipit, syncope, edema, etc... ~  7/12:  C/o incr edema in legs & advised to elim sodium from diet & incr her LASIX to 40mg /d... ~  10/12:  Prev Cozaar 50mg  was stopped 10/12 via the ER when she presented w/ ?angioedema of the tongue...  Hx of CHEST PAIN (ICD-786.50) - she takes ASA 81mg /d + Tylenol Prn... followed by DrBensimhon for Cards. ~  Baseline EKG w/ RBBB & LAD, no acute changes... ~  NuclearStressTest done 2007 & 2009 showed normal- without scar or ischemia, EF=67%...   ~  2DEcho 7/08 showed mild AI, mild diastolic dysfunction, EF=55-60%. ~  CT Abd 1/09 & 9/12 showed incidental coronary calcif, & aorto-iliac atherosclerotic changes as well...  VENOUS INSUFFICIENCY (ICD-459.81) - she has mod VI and follows a low sodium diet, elevates legs when able, and wears support hose. ~  7/12:  C/o incr edema & we reviewed a 2gm sodium diet & increased her Lasix from 20mg /d to 40mg /d...  HIATAL HERNIA (ICD-553.3) & GASTRITIS (ICD-535.50) - on OMEPRAZOLE 20mg /d... ~  last EGD was 2/08 w/ 3cm HH & gastitis... ~  CT Abd 1/09 revealed calcif gallstones, & mild ectasia of AA... ~  CT Abd 1/11 showed left colon divertics, tiny hepatic lesions w/o change & right renal cyst stable. ~  CT Abd 9/12 showed divertics (no inflamm), right renal & left hep lobe  cysts, atherosclerotic changes, NAD...  DIVERTICULOSIS, COLON (ICD-562.10) - followed regularly by DrStark. ~  colonoscopy 7/03 by DrStark showing divertics only... we discussed Miralax + Senakot-S for constipation. ~  colonoscopy 2/11 showed divertics, 3mm polyp, hems...  ? of ABDOMINAL PAIN & SYMPTOMATOLOGY - she had a very thorough work up in 2009> she has a cancer phobia and is very difficult to reassure her that everything appropriate has been done & no cancer found... Mult additional GI evals since then from [facility], [facility] (DrEdwards), [facility] (DrThorne)... ~  eval by GI- DrStark 12/08 but pt wasn't satisfied w/ his examination... ~  full labs 1/09 were WNL, sed=20... ~  CT Abd/ Pelvis 1/09= no acute abn in abd (calcif gallstones & mild ectasia of AA), calcif fibroid in the pelvis, & ?regarding the left ovary. ~  referral to DrFontaine, GYN> his notes are reviewed... ~  follow up w/ DrKimbrough, Urology> his notes are reviewed... ~  eval DrWhitfield for Orthopedics- rec shots in back by [facility] w/ some improvement. ~  repeat GI eval 1/11 by DrStark w/ CT & colonoscpy as above- no acute problems. ~  5/11 & 7/11 >> persist complaints w/ neg recheck by DrStark & DrKimbrough... ~  12/11:  routine GYN surveillance DrFontaine found anteverted uterus w/ fibroids, atrophic ovaries bilat, no mass. ~  Repeat GI eval 4/12 by DrStark> he tried to reassure the pt, felt some symptoms might be from poor abd wall muscle tone, rec continue Omep, laxatives... ~  6/12:  She sought another GI opinion DrEdwards> he did not feel she needed any additional testing... ~  She tells me she has yet another appt to see a gastroenterologist at [facility]==> seen by DrThorne 8/12 & note reviewed... ~  EGD by DrThorne 9/12 w/ mult whitish nodular plaques in cardia & fundus> Bx results pending & she is very concerned (remember her hx cancer phobia).  RENAL CYST (ICD-593.2) -  she has a 1 cm right renal cyst seen on CT in  2007 and followed by DrKimbrough... f/u CT Abd 1/09 - no change in the cyst... f/u renal ultrasound 1/10 w/ small bilat cysts... f/u CT Abd 1/11- no change in cyst. ~  she saw DrKimbrough 7/11 for LBP, microhematuria, urge incontinence- stable, no additional Rx. ~  she saw DrKimbrough 1/12 for urge incontinence, freq, nocturia> rec Desmopressin 0.2mg - 1/2 tab Qhs.  CARCINOMA, BREAST, LEFT (ICD-174.9) - Surgery 9/94 by DrBlievernicht w/ Left modified radical mastectomy... +estrogen receptors and Rx w/ Tamoxifen... f/u mammograms all neg... breast exam on right has been neg- no nodules palpated...  DEGENERATIVE JOINT DISEASE (ICD-715.90) - on OTC meds + VICODIN Prn... she has end-stage OA of Right Knee per DrWhitfield treated w/ shots in the past... she has a knee brace and a cane for ambulation...  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 Rx w/ rest, heat, Percocet, and f/u w/ Ortho...  Hx of LOW BACK PAIN, CHRONIC (ICD-724.2) - eval DrWhitfield w/ rec for shots by DrNewton & she's feeling sl better & wears back brace... ~  5/10: mult somatic complaints- primarily c/o left side/ postero-lat rib pain...  Exam w/ tender left lat/ post ribs... Bone Scan= neg... ~  MRI Lumbar spine 7/10 by DrWhitfield... ~  Epidural steroid shot by DrNewton 8/10 & 1/11 ~  Repeat LBP eval by DrWhitfield w/ another MRI lumbar spine showing degenerative changes w/ mild progression since 7/10 MRI, mild-mod sp stenosis & lat recess stenosis at several levels  CARPAL TUNNEL SYNDROME, BILATERAL (ICD-354.0) - she had prev right carpal tunnel release by DrWhitfield and was c/o increasing discomfort in her left wrist despite wrist splint Qhs- had left carpal tunnel release 1/10 by DrWhitfield...  ANXIETY (ICD-300.00) - on ALPRAZOLAM 0.5mg  Tid & encouraged to take it regularly... she has a cancer phobia & it is very difficult to reassure her  regarding her symptoms and our evaluations...   Past Surgical History  Procedure Date  . Left mastectomy   . Carpal tunnel release     Outpatient Encounter Prescriptions as of 09/22/2011  Medication Sig Dispense Refill  . ALPRAZolam (XANAX) 0.5 MG tablet Take 1 tablet (0.5 mg total) by mouth 3 (three) times daily as needed.  90 tablet  5  . amLODipine (NORVASC) 2.5 MG tablet Take 1 tablet (2.5 mg total) by mouth daily.  30 tablet  6  . aspirin 81 MG tablet Take 81 mg by mouth daily.        . carvedilol (COREG) 3.125 MG tablet TAKE ONE TABLET TWICE DAILY FOR BLOOD   PRESSURE  60 tablet  6  . Cholecalciferol (VITAMIN D3) 1000 UNITS tablet Take 1,000 Units by mouth daily.        . furosemide (LASIX) 40 MG tablet Take 1/2  To 1 tablet by mouth in the morning for swelling        . HYDROcodone-acetaminophen (VICODIN) 5-500 MG per tablet Take 1 tablet by mouth every 8 (eight) hours as needed. For severe pain. NOT TO EXCEED THREE PER DAY.  90 tablet  0  . Multiple Vitamin (MULTIVITAMIN) capsule Take 1 capsule by mouth daily.        Marland Kitchen omeprazole (PRILOSEC) 20 MG capsule Take 20 mg by mouth daily.        . polyethylene glycol (MIRALAX / GLYCOLAX) packet Take 17 g by mouth 2 (two) times daily as needed.        Marland Kitchen  Pseudoephedrine-Guaifenesin (MUCINEX D PO) Take by mouth. As needed       . desmopressin (DDAVP) 0.2 MG tablet Take 0.2 mg by mouth daily. Take 1/2 daily       . fluocinonide (LIDEX) 0.05 % cream Apply to affected area  Two times daily as directed  60 g  5  . fluticasone (FLONASE) 50 MCG/ACT nasal spray 2 sprays by Nasal route at bedtime.  16 g  11  . losartan (COZAAR) 50 MG tablet Take 50 mg by mouth daily.        . meclizine (ANTIVERT) 25 MG tablet Take 1/2 to 1 tablet by mouth every 4 hours as needed for dizziness  50 tablet  6  . psyllium (METAMUCIL) 58.6 % powder Take 1 packet by mouth daily.        . sennosides-docusate sodium (SENOKOT-S) 8.6-50 MG tablet Take 1 tablet by mouth daily.         . simethicone (MYLICON) 80 MG chewable tablet Chew 80 mg by mouth. Take 1 tablet in afternoon after meal         Allergies  Allergen Reactions  . Fentanyl     REACTION: nausea    Current Medications, Allergies, Past Medical History, Past Surgical History, Family History, and Social History were reviewed in [employer] record.    Review of Systems      See HPI - all other systems neg except as noted... She has mult somatic complaints and anxiety. The patient complains of decreased hearing, chest discomfort, dyspnea on exertion, abdominal pain, incontinence, muscle weakness, and difficulty walking.  The patient denies anorexia, fever, vision loss, hoarseness, syncope, peripheral edema, prolonged cough, headaches, hemoptysis, melena, hematochezia, severe indigestion/heartburn, hematuria, transient blindness, depression, abnormal bleeding, enlarged lymph nodes, and angioedema.     Objective:   Physical Exam   WD, WN, 77 y/o BF in NAD... she is very anxious... GENERAL:  Alert & oriented; pleasant & cooperative... HEENT:  [place]/AT, EOM-wnl, PERRLA, EACs-clear, TMs-wnl, NOSE-clear, THROAT-clear & wnl, no lesions seen. NECK:  Neck ROM decreased, no JVD; normal carotid impulses w/o bruits; no thyromegaly or nodules palpated; no lymphadenopathy. CHEST:  Clear to P & A; without wheezes/ rales/ or rhonchi heard... + tender right lat 11th & 12th ribs on palp. BREAST:  s/p left mastectomy... right breast normal- without nodules palpated... HEART:  Regular Rhythm; without murmurs/ rubs/ or gallops detected... ABDOMEN:  Soft & nontender; normal bowel sounds; no organomegaly or masses palpated... EXT: without deformities, mod arthritic changes; no varicose veins/ +venous insuffic/ tr edema. NEURO:  CN's intact; no focal neuro deficits... DERM:  Small sl excoriated area of dermatitis left shin- no drainage,no rash, etc...   Assessment & Plan:   GI> HH, Gastritis,  Divertics, vague abd pain>  I tried once again to reassure her about the thorough GI evaluations that her has undergone but she seems inconsolable; she is anxious for her f/u at [facility] to find out about the white nodular plques they removed from her stomach... In the meanwhile she will continue the Prilosec, and state her laxatives as directed while trying to minimize the amt of Vicodin she uses...   Dyspnea>  She had a neg pulm eval 2011 as above; pt very anxious & Alprazolam seems to help when she takes it...  HBP>  Controlled on low doses of several meds including Coreg, Norvasc, Cozaar, & Lasix;  She is encouraged to take meds regularly & maintain regular follow up visits...  Hx CP>  evaluated for Cards by DrBensimon & he has not been in favor of invasive testing; stable on conservative med rx...  VI/ Edema>  She is concerned about incr edema & we reviewed 2gm sodium diet & rec keep Lasix at 40mg /d...  Renal cyst>  This is benign & has been followed by DrKimbrough...  Hx left Breast Cancer>  No known recurrence...  DJD> Cervical spondylosis & chronic LBP>  She has been evaluated by DrWhitfield for Ortho & given epid steroid shots by DrNewton...  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  Anxiety>  Asked once again to increase the alprazolam to 1/2 - 1 tab TID regularly.Marland KitchenMarland Kitchen

## 2011-09-23 ENCOUNTER — Telehealth: Payer: Self-pay | Admitting: Pulmonary Disease

## 2011-09-23 NOTE — Telephone Encounter (Signed)
ATC-line busy. Called again and she answered and I advised pt okay to use [employer]. Pt verbalized understanding

## 2011-10-29 ENCOUNTER — Telehealth: Payer: Self-pay | Admitting: Pulmonary Disease

## 2011-10-29 NOTE — Telephone Encounter (Signed)
Pt is aware ct negative per SN. She says she will follow-up with Dr. Alvester Morin for her back pain.

## 2011-10-29 NOTE — Telephone Encounter (Signed)
I spoke with pt and she states Dr. Jamal Collin from [facility] was suppose to fax a scan of her stomach over to SN to review. Please advise Dr. Kriste Basque if you have received these results. Thanks

## 2011-10-29 NOTE — Telephone Encounter (Signed)
ATC x2.  Line busy.  WCB. 

## 2011-10-29 NOTE — Telephone Encounter (Signed)
Papers have been placed with this message on SN cart for his review.

## 2011-10-29 NOTE — Telephone Encounter (Signed)
Per SN---he has reviewed the scan that was sent by Dr. Derrill Memo doctors agree that her ct scan was negative.  No acute abd and nothing seen to explain her symptoms.  thanks

## 2011-11-19 ENCOUNTER — Telehealth: Payer: Self-pay | Admitting: Pulmonary Disease

## 2011-11-19 NOTE — Telephone Encounter (Signed)
Pt understands to call her ortho dr and get an appt for the shoulder pain.

## 2011-11-19 NOTE — Telephone Encounter (Signed)
Pt c/o left shoulder edema down to elbow with pain (with movement only) x 1 week. Pain is 9 of 10, Vicodin she is taking for back pain is not helping. Has appointment for same with SN on Friday and feels this is not soon enough. Pt states she does not want to go to the ER or medications before seeing SN. Please advise, thank you.   Allergies  Allergen Reactions  . Fentanyl     REACTION: nausea   [place]

## 2011-11-19 NOTE — Telephone Encounter (Signed)
Per SN----she will need to call and see ortho or get a 2nd opinion from ortho for this pain.  SN does not treat orthopedic shoulder pain, so she will have to see ortho for this pain.  thanks

## 2011-11-20 ENCOUNTER — Other Ambulatory Visit: Payer: Self-pay | Admitting: *Deleted

## 2011-11-20 MED ORDER — AMLODIPINE BESYLATE 2.5 MG PO TABS: 2.5000 mg | ORAL_TABLET | Freq: Every day | ORAL | Status: DC

## 2011-11-21 ENCOUNTER — Ambulatory Visit: Payer: Medicare Other | Admitting: Pulmonary Disease

## 2011-12-17 ENCOUNTER — Telehealth: Payer: Self-pay | Admitting: Pulmonary Disease

## 2011-12-17 NOTE — Telephone Encounter (Signed)
Spoke with patient- she is aware that the paper she got was from another MD office and should contact their office about the information. She will call today and if anything needed from [place] them she will call us back.

## 2011-12-17 NOTE — Telephone Encounter (Signed)
I spoke with pt and she states she received a letter from Dr. Lasandra Beech office advising her that when she comes for her visit she needs a authorized visit. I have called Dr. Alyson Ingles office to see what is needed at [phone] and was on hold x 5 minutes. Will try to call back

## 2011-12-18 NOTE — Telephone Encounter (Signed)
Called and spoke with Nurse with Dr. Lorri Frederick and she states that the pt is scheduled for appt with Dr. Lorri Frederick at 11:30 am on Monday 12/22/11 and she is not showing any sort of authorization needed. Spoke with pt and notified of this and she states will keep appt. Nothing further needed.

## 2011-12-29 ENCOUNTER — Encounter: Payer: Self-pay | Admitting: Pulmonary Disease

## 2011-12-29 ENCOUNTER — Ambulatory Visit (INDEPENDENT_AMBULATORY_CARE_PROVIDER_SITE_OTHER): Payer: Medicare Other | Admitting: Pulmonary Disease

## 2011-12-29 DIAGNOSIS — K5730 Diverticulosis of large intestine without perforation or abscess without bleeding: Secondary | ICD-10-CM

## 2011-12-29 DIAGNOSIS — I10 Essential (primary) hypertension: Secondary | ICD-10-CM

## 2011-12-29 DIAGNOSIS — M1990 Unspecified osteoarthritis, unspecified site: Secondary | ICD-10-CM

## 2011-12-29 DIAGNOSIS — K449 Diaphragmatic hernia without obstruction or gangrene: Secondary | ICD-10-CM

## 2011-12-29 DIAGNOSIS — M545 Low back pain, unspecified: Secondary | ICD-10-CM

## 2011-12-29 DIAGNOSIS — I872 Venous insufficiency (chronic) (peripheral): Secondary | ICD-10-CM

## 2011-12-29 DIAGNOSIS — R0602 Shortness of breath: Secondary | ICD-10-CM

## 2011-12-29 DIAGNOSIS — K219 Gastro-esophageal reflux disease without esophagitis: Secondary | ICD-10-CM

## 2011-12-29 DIAGNOSIS — F411 Generalized anxiety disorder: Secondary | ICD-10-CM

## 2011-12-29 DIAGNOSIS — K5900 Constipation, unspecified: Secondary | ICD-10-CM

## 2011-12-29 DIAGNOSIS — R109 Unspecified abdominal pain: Secondary | ICD-10-CM

## 2011-12-29 DIAGNOSIS — I519 Heart disease, unspecified: Secondary | ICD-10-CM

## 2011-12-29 DIAGNOSIS — C50919 Malignant neoplasm of unspecified site of unspecified female breast: Secondary | ICD-10-CM

## 2011-12-29 NOTE — Patient Instructions (Signed)
Today we updated your med list in our EPIC system...    Continue your current medications the same...  Continue your follow up appts w/ DrWhitfield & DrThoprne...  Let's plan a follow up visit in 3 months to keep track of your progress.Marland KitchenMarland Kitchen

## 2011-12-29 NOTE — Progress Notes (Signed)
Subjective:    Patient ID: Heather [name], female    DOB: 1918-05-12, 78 y.o.   MRN: [id number]  HPI 78 y/o BF here for a follow up visit... she has multiple medical problems including:  Dyspnea & Anxiety;  HBP;  Hx CP followed by DrBensimhon;  HH/ Gastritis;  Divertics/ Constip;  Functional Abd Pain;  Renal cyst followed by DrKimbrough;  Hx left breast [phone];  DJD/ CSpine spondy/ LBP/ CTS; and Anxiety...  ~  December 26, 2010:  add-on appt due to right side pain x4d she says> "I feel fine, BUT..." states the right post CWP is unbearable, sharp, worse w/ movement, & denies trauma... area over the right 11-12th ribs is tender to palp & reproduces her discomfort;  chest is clear & no rubs w/ O2 sat 98% RA;  last CXR 9/11 clear, some scarring, ectatic Ao, DJD sp;  she wants to relate this all to her prev complaints of Abd pain & swelling, left flank pain, etc (recall extensive evals w/ nothing signif found to explain her varied symptoms)... ** we discussed rx w/ rest, heat, rib binder, Lidoderm patches, Vicodin Prn.  ~  February 10, 2011:  6wk ROV- she saw TP 3/16 w/ cough & mult somatic complaints trouble swallowing, throat closing, drainage, nasal congestion, "harking phlegm" etc;  CXR= stable, ectatic Ao, NAD;  Labs include BMet=wnl, BNP=110, UA=clear;  Rx w/ Zyrtek, Saline, Nasonex, Pepcid, Mucinex, & keep appt w/ DrStark for swallowing eval... Now c/o same constellation of symptoms more or less, never took the Zyrtek or Nasonex (too$$) & we discussed regimen w/ Zyrtek AM, Saline/ Mucinex during the day/ Flonase Qhs...    Dyspnea>  On Mucinex plus her anxiolytics (Alprazolam) but not taking regularly...    HBP>  On Coreg, Norvasc, Cozaar, Lasix & BP= 110/64 & well controlled, she thinks too many meds...    GI>  She has hx HH, gastritis, divertics, constip, & functional abd pain w/ mult extensive evaluations in the past;  On Omep20, Miralax, Senakot-S but she persists w/ c/o swelling & "something  wrong in my stomach it's getting bigger & bigger- I might have cancer"; last CT Abd 1/11 reviewed- divertics, otherw neg;  States her appetite is OK but weight down 12#;  C/o left side of abd hurts when she gets up to walk, no pain sitting or supine;  Offered additional meds (try Levsin or bentyl but she declines);  She has up coming appt w/ DrStark.    DJD>  Followed by DrWhitfield & she's had epid shots by DrNewton in the past    Anxiety>  On Alprazolam but she won't take it regularly for help w/ her severe anxiety & cancer phobia...  ~  June 12, 2011:  36mo ROV & she persists w/ mult somatic complaints & flight of ideas hx, "I have really suffered" she says...    GI> Hx HH, gastritis, divertics, & vague abd pains> she is not happy w/ DrStark's eval & sought second opinion (DrMann declined to see her); she saw DrJEdwards & his notes are reviewed;  Today she is quite distraught about papers to fill out for the GI dept at [facility]- DrNThorne...    Ortho> c/o pain in left leg w/ some edema (she has VI & dependent edema) & states her legs feel like lead; exam shows some incr in LE edema & we discussed incr in her Lasix from 20mg  to 40mg /d plus a 2gm sodium diet...    We rechecked her  labs today> all WNL.Marland Kitchen.  ~  July 09, 2011:  75mo ROV & add-on due to sm area of dermatitis on her left leg, sl excoriated & sm scab==> her son is here today & told me she drove him crazy w/ her ruminations about this being a cancer & that we would send her to the cancer center, etc (she got angry at him for telling me); we discussed dermatitis & rec RX w/ LIDEX-E cream to apply Bid & rec Derm review if not resolved...    She is also quite concerned about a "bump" on the back of her head==> occipital bone & it's symmetric, she is reassured...    She has been to [facility] DrThorne & note is pending but she has lab sheet (all wnl) & is sch for EGD 9/12; they told her to stop Metamucil, and take Miralax, Senakot; she is most concerned/  upset by an evaluation form she received asking questions like how long she waited for this or that during her visit & she doesn't know what to do or how to complete the info! I encouraged her to take her Alpraz regularly Tid...  ~  September 22, 2011:  2.77mo ROV & she states "terrible" c/o phlegm, swelling, feeling bad, wt loss, "this throat business- they found white balls"; very anxious w/ mult somatic complaints >>    HBP> on Norvasc2.5, Coreg3.125Bid, Losartan50, Lasix20-40/d; she went to the ER w/ ?angioedema of the tongue, seen by [facility] who called me & we decided to stop her Cozaar; BP today off Cozaar & on the other 3 meds showed 122/72> rec to continue the same...    GI> she saw [facility]Thorne at [facility] 9/12 for a comprehensive GI eval> note reviewed- they did an EGD 08/06/11 w/ mult white nodular plaques seen in cardia & fundus- Bx'd & pending (she is very concerned);  she went to the ER 08/16/11 w/ abd discomfort- the same vague left side abd discomfort that she has seen mult physicians about- Exam showed mild diffuse tenderness;  they did CT Abd showing divertics, no inflamm, right renal & left hep lobe cysts, atherosclerotic changes, NAD;  Labs & urine were essentially WNL;  EKG showed NSR, RBBB & LAD, no acute changes...  she saw DrEdwards [facility]) 10/12 w/ mult complaints including "a knot on her skull & she feels that her brains are coming out"; he thought she was doing reasonably well from the GI standpoint w/ her dysphagia, vague abd pain, & constip...    Ortho> she saw DrWhitfield w/ MRI Lumbar spine 10/12 showing degenerative changes w/ mild progression since 7/10 MRI, mild-mod sp stenosis & lat recess stenosis at several levels; she has Vicodin to take up to 3/d as needed for pain; she is anxiously awaiting f/u visit w/ DrWhitfield...    Derm> DrLupton saw pt 07/23/11 w/ removal of sm squamous cell ca (in situ) from left lower leg  ~  December 29, 2011:  23mo ROV & [name] has her usual  complement of somatic complaints> all seem stable w/o appreciable change; she does not want to see DrJEdwards again, but is happy w/ eval & communication from DrThorne at [facility] says her abd pain is coming from the arthritis in her back, & she has seen DrWhitfield for that... She lists mult OTC GI meds including> Prilosec, Miralax, Metamucil, Senakot, Mylicon...    BP controlled on her Coreg, Amlodipine, Lasix; BP= 120/68 today & denies CP/ palpit, ch in DOE, edema, etc..Marland Kitchen  She saw DrThorne recently for f/u & note pending; prev note 11/12 reviewed> she has done a thorough investigation...    She saw DrWhitfield 12/12 for f/u LBP w/ known DJD & severe sp stenosis L4-5 & neurogenic claudication; she did PT & has a brace, he referred her to [facility] for shots...          Problem List:  DYSPNEA (ICD-786.9) - eval 5/11 c/o SOB- can't get a DB, can't get enough air in, etc... occurs at rest & w/ exerc "I'm very active despite my arthritis", talks rapidly in long sentences w/o apparent distress... no cough, phlegm, hemoptysis, etc- but as usual she is quite distressed by these symptoms and wants further eval> we discussed re-assessment w/ CXR (clear, NAD);  PFT (totally norm airflow);  Labs (all WNL);  Rx w/ ALPRAZOLAM 0.5mg  Tid & encouraged to take regularly.  HYPERTENSION (ICD-401.9) - on AMLODIPINE 2.5mg /d,  COREG 3.125mg Bid,  LASIX 40mg - taking 1/2tab/d because 1 tab makes her too weak... BP today= 122/72, takes meds regularly & tolerating well... denies HA, visual changes, palipit, syncope, edema, etc... ~  7/12:  C/o incr edema in legs & advised to elim sodium from diet & incr her LASIX to 40mg /d... ~  10/12:  Prev Cozaar 50mg  was stopped 10/12 via the ER when she presented w/ ?angioedema of the tongue... ~  2/13:  BP= 120/68 & doing well on 3 med regimen & off Losartan.  Hx of CHEST PAIN (ICD-786.50) - she takes ASA 81mg /d + Tylenol Prn... followed by DrBensimhon for Cards. ~  Baseline EKG w/ RBBB &  LAD, no acute changes... ~  NuclearStressTest done 2007 & 2009 showed normal- without scar or ischemia, EF=67%...   ~  2DEcho 7/08 showed mild AI, mild diastolic dysfunction, EF=55-60%. ~  CT Abd 1/09 & 9/12 showed incidental coronary calcif, & aorto-iliac atherosclerotic changes as well...  VENOUS INSUFFICIENCY (ICD-459.81) - she has mod VI and follows a low sodium diet, elevates legs when able, and wears support hose. ~  7/12:  C/o incr edema & we reviewed a 2gm sodium diet & increased her Lasix from 20mg /d to 40mg /d...  HIATAL HERNIA (ICD-553.3) & GASTRITIS (ICD-535.50) - on OMEPRAZOLE 20mg /d... ~  last EGD was 2/08 w/ 3cm HH & gastitis... ~  CT Abd 1/09 revealed calcif gallstones, & mild ectasia of AA... ~  CT Abd 1/11 showed left colon divertics, tiny hepatic lesions w/o change & right renal cyst stable. ~  CT Abd 9/12 showed divertics (no inflamm), right renal & left hep lobe cysts, atherosclerotic changes, NAD...  DIVERTICULOSIS, COLON (ICD-562.10) - followed regularly by DrStark. ~  colonoscopy 7/03 by DrStark showing divertics only... we discussed Miralax + Senakot-S for constipation. ~  colonoscopy 2/11 showed divertics, 3mm polyp, hems...  ? of ABDOMINAL PAIN & SYMPTOMATOLOGY - she had a very thorough work up in 2009> she has a cancer phobia and is very difficult to reassure her that everything appropriate has been done & no cancer found... Mult additional GI evals since then from [facility], [facility] (DrEdwards), [facility] (DrThorne)... ~  eval by GI- DrStark 12/08 but pt wasn't satisfied w/ his examination... ~  full labs 1/09 were WNL, sed=20... ~  CT Abd/ Pelvis 1/09= no acute abn in abd (calcif gallstones & mild ectasia of AA), calcif fibroid in the pelvis, & ?regarding the left ovary. ~  referral to DrFontaine, GYN> his notes are reviewed... ~  follow up w/ DrKimbrough, Urology> his notes are reviewed... ~  eval DrWhitfield for Orthopedics- rec shots in back by [facility] w/ some  improvement. ~  repeat GI eval 1/11 by DrStark w/ CT & colonoscpy as above- no acute problems. ~  5/11 & 7/11 >> persist complaints w/ neg recheck by DrStark & DrKimbrough... ~  12/11:  routine GYN surveillance DrFontaine found anteverted uterus w/ fibroids, atrophic ovaries bilat, no mass. ~  Repeat GI eval 4/12 by DrStark> he tried to reassure the pt, felt some symptoms might be from poor abd wall muscle tone, rec continue Omep, laxatives... ~  6/12:  She sought another GI opinion DrEdwards> he did not feel she needed any additional testing... ~  She tells me she has yet another appt to see a gastroenterologist at [facility]==> seen by DrThorne 8/12 & note reviewed... ~  EGD by DrThorne 9/12 w/ mult whitish nodular plaques in cardia & fundus> Bx results pending & she is very concerned (remember her hx cancer phobia). ~  She continues to f/u w/ DrThorne at [facility] & is happy w/ her eval...  RENAL CYST (ICD-593.2) - she has a 1 cm right renal cyst seen on CT in 2007 and followed by DrKimbrough... f/u CT Abd 1/09 - no change in the cyst... f/u renal ultrasound 1/10 w/ small bilat cysts... f/u CT Abd 1/11- no change in cyst. ~  she saw DrKimbrough 7/11 for LBP, microhematuria, urge incontinence- stable, no additional Rx. ~  she saw DrKimbrough 1/12 for urge incontinence, freq, nocturia> rec Desmopressin 0.2mg - 1/2 tab Qhs.  CARCINOMA, BREAST, LEFT (ICD-174.9) - Surgery 9/94 by DrBlievernicht w/ Left modified radical mastectomy... +estrogen receptors and Rx w/ Tamoxifen... f/u mammograms all neg... breast exam on right has been neg- no nodules palpated...  DEGENERATIVE JOINT DISEASE (ICD-715.90) - on OTC meds + VICODIN Prn... she has end-stage OA of Right Knee per DrWhitfield treated w/ shots in the past... she has a knee brace and a cane for ambulation...  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 Rx w/ rest, heat, Percocet, and  f/u w/ Ortho...  Hx of LOW BACK PAIN, CHRONIC (ICD-724.2) - eval DrWhitfield w/ rec for shots by DrNewton & she's feeling sl better & wears back brace... ~  5/10: mult somatic complaints- primarily c/o left side/ postero-lat rib pain...  Exam w/ tender left lat/ post ribs... Bone Scan= neg... ~  MRI Lumbar spine 7/10 by DrWhitfield... ~  Epidural steroid shot by DrNewton 8/10 & 1/11 ~  Repeat LBP eval by DrWhitfield w/ another MRI lumbar spine showing degenerative changes w/ mild progression since 7/10 MRI, mild-mod sp stenosis & lat recess stenosis at several levels ~  DrWhitfield referred her to [facility] to consider more shots but she is not so inclined...  CARPAL TUNNEL SYNDROME, BILATERAL (ICD-354.0) - she had prev right carpal tunnel release by DrWhitfield and was c/o increasing discomfort in her left wrist despite wrist splint Qhs- had left carpal tunnel release 1/10 by DrWhitfield...  ANXIETY (ICD-300.00) - on ALPRAZOLAM 0.5mg  Tid & encouraged to take it regularly... she has a cancer phobia & it is very difficult to reassure her regarding her symptoms and our evaluations...   Past Surgical History  Procedure Date  . Left mastectomy   . Carpal tunnel release     Outpatient Encounter Prescriptions as of 12/29/2011  Medication Sig Dispense Refill  . ALPRAZolam (XANAX) 0.5 MG tablet Take 1 tablet (0.5 mg total) by mouth 3 (three) times daily as needed.  90 tablet  5  . amLODipine (NORVASC) 2.5 MG tablet Take 1 tablet (2.5 mg total) by mouth daily.  30 tablet  6  . aspirin 81 MG tablet Take 81 mg by mouth daily.        . carvedilol (COREG) 3.125 MG tablet TAKE ONE TABLET TWICE DAILY FOR BLOOD   PRESSURE  60 tablet  6  . Cholecalciferol (VITAMIN D3) 1000 UNITS tablet Take 1,000 Units by mouth daily.        . furosemide (LASIX) 40 MG tablet Take 1/2  To 1 tablet by mouth in the morning for swelling        . HYDROcodone-acetaminophen (VICODIN) 5-500 MG per tablet Take 1 tablet by mouth  every 8 (eight) hours as needed. For severe pain. NOT TO EXCEED THREE PER DAY.  90 tablet  0  . Multiple Vitamin (MULTIVITAMIN) capsule Take 1 capsule by mouth daily.        Marland Kitchen omeprazole (PRILOSEC) 20 MG capsule Take 20 mg by mouth daily.        . polyethylene glycol (MIRALAX / GLYCOLAX) packet Take 17 g by mouth 2 (two) times daily as needed.        . Pseudoephedrine-Guaifenesin (MUCINEX D PO) Take by mouth. As needed       . psyllium (METAMUCIL) 58.6 % powder Take 1 packet by mouth daily.        . sennosides-docusate sodium (SENOKOT-S) 8.6-50 MG tablet Take 1 tablet by mouth daily.        . simethicone (MYLICON) 80 MG chewable tablet Chew 80 mg by mouth. Take 1 tablet in afternoon after meal         Allergies  Allergen Reactions  . Fentanyl     REACTION: nausea    Current Medications, Allergies, Past Medical History, Past Surgical History, Family History, and Social History were reviewed in [employer] record.    Review of Systems      See HPI - all other systems neg except as noted... She has mult somatic complaints and anxiety. The patient complains of decreased hearing, chest discomfort, dyspnea on exertion, abdominal pain, incontinence, muscle weakness, and difficulty walking.  The patient denies anorexia, fever, vision loss, hoarseness, syncope, peripheral edema, prolonged cough, headaches, hemoptysis, melena, hematochezia, severe indigestion/heartburn, hematuria, transient blindness, depression, abnormal bleeding, enlarged lymph nodes, and angioedema.     Objective:   Physical Exam   WD, WN, 78 y/o BF in NAD... she is very anxious... GENERAL:  Alert & oriented; pleasant & cooperative... HEENT:  [place]/AT, EOM-wnl, PERRLA, EACs-clear, TMs-wnl, NOSE-clear, THROAT-clear & wnl, no lesions seen. NECK:  Neck ROM decreased, no JVD; normal carotid impulses w/o bruits; no thyromegaly or nodules palpated; no lymphadenopathy. CHEST:  Clear to P & A; without wheezes/  rales/ or rhonchi heard... + tender right lat 11th & 12th ribs on palp. BREAST:  s/p left mastectomy... right breast normal- without nodules palpated... HEART:  Regular Rhythm; without murmurs/ rubs/ or gallops detected... ABDOMEN:  Soft & nontender; normal bowel sounds; no organomegaly or masses palpated... EXT: without deformities, mod arthritic changes; no varicose veins/ +venous insuffic/ tr edema. NEURO:  CN's intact; no focal neuro deficits... DERM:  Small sl excoriated area of dermatitis left shin- no drainage,no rash, etc...  RADIOLOGY DATA:  Reviewed in the EPIC EMR & discussed w/ the patient...  LABORATORY DATA:  Reviewed in the EPIC EMR & discussed w/ the patient...   Assessment & Plan:   GI> HH, Gastritis,  Divertics, vague abd pain>  I tried once again to reassure her about the thorough GI evaluations that her has undergone; she is happy w/ DrThorne's eval & will f/u w/ her & in the meanwhile continue rx w/ Prilosec, Miralax, Metamucil, Senakot, Mylicon, etc...   Dyspnea>  She had a neg pulm eval 2011 as above; pt very anxious & Alprazolam seems to help when she takes it...  HBP>  Controlled on low doses of several meds including Coreg, Norvasc & Lasix;  She is encouraged to take meds regularly & maintain regular follow up visits...  Hx CP> evaluated for Cards by DrBensimon & he has not been in favor of invasive testing; stable on conservative med rx...  VI/ Edema>  She is concerned about incr edema & we reviewed 2gm sodium diet & rec keep Lasix at 40mg /d...  Renal cyst>  This is benign & has been followed by DrKimbrough...  Hx left Breast Cancer>  No known recurrence...  DJD> Cervical spondylosis & chronic LBP>  She has been evaluated by DrWhitfield for Ortho & given epid steroid shots by DrNewton...  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  Anxiety>  Asked once again to increase the Alprazolam to 1/2 - 1 tab TID  regularly...   Patient's Medications  New Prescriptions   No medications on file  Previous Medications   ALPRAZOLAM (XANAX) 0.5 MG TABLET    Take 1 tablet (0.5 mg total) by mouth 3 (three) times daily as needed.   AMLODIPINE (NORVASC) 2.5 MG TABLET    Take 1 tablet (2.5 mg total) by mouth daily.   ASPIRIN 81 MG TABLET    Take 81 mg by mouth daily.     CARVEDILOL (COREG) 3.125 MG TABLET    TAKE ONE TABLET TWICE DAILY FOR BLOOD   PRESSURE   CHOLECALCIFEROL (VITAMIN D3) 1000 UNITS TABLET    Take 1,000 Units by mouth daily.     FUROSEMIDE (LASIX) 40 MG TABLET    Take 1/2  To 1 tablet by mouth in the morning for swelling     HYDROCODONE-ACETAMINOPHEN (VICODIN) 5-500 MG PER TABLET    Take 1 tablet by mouth every 8 (eight) hours as needed. For severe pain. NOT TO EXCEED THREE PER DAY.   MULTIPLE VITAMIN (MULTIVITAMIN) CAPSULE    Take 1 capsule by mouth daily.     OMEPRAZOLE (PRILOSEC) 20 MG CAPSULE    Take 20 mg by mouth daily.     POLYETHYLENE GLYCOL (MIRALAX / GLYCOLAX) PACKET    Take 17 g by mouth 2 (two) times daily as needed.     PSEUDOEPHEDRINE-GUAIFENESIN (MUCINEX D PO)    Take by mouth. As needed    PSYLLIUM (METAMUCIL) 58.6 % POWDER    Take 1 packet by mouth daily.     SENNOSIDES-DOCUSATE SODIUM (SENOKOT-S) 8.6-50 MG TABLET    Take 1 tablet by mouth daily.     SIMETHICONE (MYLICON) 80 MG CHEWABLE TABLET    Chew 80 mg by mouth. Take 1 tablet in afternoon after meal   Modified Medications   No medications on file  Discontinued Medications   No medications on file

## 2012-01-13 ENCOUNTER — Telehealth: Payer: Self-pay | Admitting: Pulmonary Disease

## 2012-01-13 DIAGNOSIS — Z1231 Encounter for screening mammogram for malignant neoplasm of breast: Secondary | ICD-10-CM

## 2012-01-13 NOTE — Telephone Encounter (Signed)
Per SN okay to to repeat mammogram and state "pt notes breast is soft and long". I have placed order in EPIC.   Pt is aware of this and needed nothing further

## 2012-01-13 NOTE — Telephone Encounter (Signed)
Spoke with pt. She believes that she needs another mammogram- last one was done Sept 2012. She states that the imaging center had her birthday mixed up with someone else's and she is afraid that the report is wrong. She states also has noticed changes in breast "it is soft and long". Wants to have another mammogram set up asap. Please advise, thanks!

## 2012-01-14 ENCOUNTER — Telehealth: Payer: Self-pay | Admitting: Pulmonary Disease

## 2012-01-14 NOTE — Telephone Encounter (Signed)
Returning Libby's call can be reached at [phone].Raylene Everts

## 2012-01-14 NOTE — Telephone Encounter (Signed)
Spoke with [facility]. She states that the order that was sent is for a screening mammogram, but since the pt has had breast changes, she is questioning whether or not SN wants her to have a diagnostic mammogram. Please advise, thanks!

## 2012-01-14 NOTE — Telephone Encounter (Signed)
Spoke to pt she called [facility] to set up her mammogram and they do want me to call to set this up

## 2012-01-14 NOTE — Telephone Encounter (Signed)
Heather [name], pt requesting to speak with you again regarding referral.

## 2012-01-15 NOTE — Telephone Encounter (Signed)
Called and spoke with pt.  Pt states she does NOT want to wait until she sees SN before having mammogram done.  Pt would like this done asap.  ATC Celcilia back at [facility] to inform her of pt's response.  Office is closed for the day.  WCB tomorrow.

## 2012-01-15 NOTE — Telephone Encounter (Signed)
Pt returning call can be reached at [phone].Heather [name]

## 2012-01-15 NOTE — Telephone Encounter (Signed)
LMOMTCB x 1 for Cecelia at [facility].

## 2012-01-15 NOTE — Telephone Encounter (Signed)
Yes ok for the diagnostic mammogram since pt has had changes.  thanks

## 2012-01-15 NOTE — Telephone Encounter (Signed)
Spoke with [employer] [employer] She states that she spoke with the pt yesterday and the pt advised her that she wanted to wait until after she sees SN to have this done after all. I want to call the pt to verify this. I called her and had to [facility].

## 2012-01-16 NOTE — Telephone Encounter (Signed)
[employer], spoke with [place].  She will inform Celcilia that pt does NOT want to wait until she sees SN before having a mammogram done and pt would like this done ASAP.  She will also have Celcilia call back so we can clarify this with her directly.  Will await call back

## 2012-01-20 NOTE — Telephone Encounter (Signed)
Called [facility] at # provided above - was directed to Celcilia's VM at ext [phone] - [email or web address] to see if Celcilia received the message and if anything further is needed.

## 2012-01-20 NOTE — Telephone Encounter (Signed)
Heather [name] returned the call and is aware the pt wants mammogram done asap. Mammogram is set for march 11. [name], CMA

## 2012-01-26 ENCOUNTER — Encounter (INDEPENDENT_AMBULATORY_CARE_PROVIDER_SITE_OTHER): Payer: Self-pay | Admitting: General Surgery

## 2012-02-09 ENCOUNTER — Telehealth: Payer: Self-pay | Admitting: Pulmonary Disease

## 2012-02-09 MED ORDER — HYDROCODONE-ACETAMINOPHEN 5-500 MG PO TABS: 1.0000 | ORAL_TABLET | Freq: Three times a day (TID) | ORAL | Status: DC | PRN

## 2012-02-09 NOTE — Telephone Encounter (Signed)
Pt requesting refill on Vicodin.  Pt recently saw SN on 12/29/11 and last had Vicodin filled 08/11/11 for # 90 x 0 refills.  SN, please advise if ok to refill or not.  Thanks!

## 2012-02-09 NOTE — Telephone Encounter (Signed)
Refill has been sent to the pharmacy for the pt °

## 2012-02-12 ENCOUNTER — Telehealth: Payer: Self-pay | Admitting: Pulmonary Disease

## 2012-02-12 NOTE — Telephone Encounter (Signed)
Pt is aware of SN recs and will call if her sxs do not improve or seek emergency help if things get worse.

## 2012-02-12 NOTE — Telephone Encounter (Signed)
Per SN---  1.  Keep legs elevated and wear support hose. 2.  Ok to increase lasix 40mg  daily regularly for the swelling 3.  Take mucinex 2 po bid with lots of water daily.  thanks

## 2012-02-12 NOTE — Telephone Encounter (Signed)
Pt c/o increased bilateral LE edema since yesterday. Pt said the swelling went down some overnight and she is trying to keep her legs elevated this morning as much as possible. Swelling has increased since she got up this morning but she did take 1 whole 40mg  Lasix this morning, instead of, a half tablet. She also c/o thick mucus in the back of her throat. She says this has been going on for some time but seems to be getting worse. She says she started back on the Mucinex twice a day. Pls advise. Allergies  Allergen Reactions  . Fentanyl     REACTION: nausea

## 2012-03-05 ENCOUNTER — Telehealth: Payer: Self-pay | Admitting: Pulmonary Disease

## 2012-03-05 ENCOUNTER — Telehealth: Payer: Self-pay | Admitting: Gastroenterology

## 2012-03-05 NOTE — Telephone Encounter (Signed)
She reports that she is "hocking up" with some blood tinged.  She denies nausea and vomiting.   She is advised that she needs to see her primary care MD.

## 2012-03-05 NOTE — Telephone Encounter (Signed)
I spoke with pt and states she has been having stomach pain that radiates into her back. She states she has not been able to use the restroom and thinks this is coming from her stomach issues. I advised pt if she believes it's a GI issue then she needed to contact Dr. Anselm Jungling office regarding this. She voiced her understanding and needed nothing further

## 2012-03-05 NOTE — Telephone Encounter (Signed)
Spoke with pt and she is c/o non prod cough and states "feels like I need to hock something up but it won't come up". OV with TP for eval 03-10-12 and advised seek emergent care sooner if needed.

## 2012-03-05 NOTE — Telephone Encounter (Signed)
Dr Russella Dar there is a note in the system that says she can't schedule with you.  She contacted Dr Kriste Basque to be seen and they have advised her to call GI.  Please advise.  She is having pain and constipation.  She was not discharged from the practice

## 2012-03-10 ENCOUNTER — Ambulatory Visit (INDEPENDENT_AMBULATORY_CARE_PROVIDER_SITE_OTHER)
Admission: RE | Admit: 2012-03-10 | Discharge: 2012-03-10 | Disposition: A | Discharge status: Home / Self Care | Payer: Medicare Other | Source: Ambulatory Visit | Attending: Adult Health | Admitting: Adult Health

## 2012-03-10 ENCOUNTER — Ambulatory Visit (INDEPENDENT_AMBULATORY_CARE_PROVIDER_SITE_OTHER): Payer: Medicare Other | Admitting: Adult Health

## 2012-03-10 ENCOUNTER — Encounter: Payer: Self-pay | Admitting: Adult Health

## 2012-03-10 VITALS — BP 132/62 | HR 76 | Temp 97.8°F | Wt 163.8 lb

## 2012-03-10 DIAGNOSIS — R059 Cough, unspecified: Secondary | ICD-10-CM

## 2012-03-10 DIAGNOSIS — K5900 Constipation, unspecified: Secondary | ICD-10-CM

## 2012-03-10 DIAGNOSIS — J069 Acute upper respiratory infection, unspecified: Secondary | ICD-10-CM | POA: Insufficient documentation

## 2012-03-10 DIAGNOSIS — R05 Cough: Secondary | ICD-10-CM

## 2012-03-10 NOTE — Assessment & Plan Note (Signed)
  Miralax Twice daily   Senokot  S Twice daily   Begin Align -Probiotic daily  May use Ducolax As needed  For constipation  Drink plenty of fluids

## 2012-03-10 NOTE — Progress Notes (Signed)
Subjective:    Patient ID: Heather [name], female    DOB: 15-May-1918, 78 y.o.   MRN: [id number]  HPI 78 y/o with known hx of multiple medical problems including:  Dyspnea & Anxiety;  HBP;  Hx CP followed by DrBensimhon;  HH/ Gastritis;  Divertics/ Constip;  Functional Abd Pain;  Renal cyst followed by DrKimbrough;  Hx left breast [phone];  DJD/ CSpine spondy/ LBP/ CTS; and Anxiety...  ~  December 26, 2010:  add-on appt due to right side pain x4d she says> "I feel fine, BUT..." states the right post CWP is unbearable, sharp, worse w/ movement, & denies trauma... area over the right 11-12th ribs is tender to palp & reproduces her discomfort;  chest is clear & no rubs w/ O2 sat 98% RA;  last CXR 9/11 clear, some scarring, ectatic Ao, DJD sp;  she wants to relate this all to her prev complaints of Abd pain & swelling, left flank pain, etc (recall extensive evals w/ nothing signif found to explain her varied symptoms)... ** we discussed rx w/ rest, heat, rib binder, Lidoderm patches, Vicodin Prn.  ~  February 10, 2011:  6wk ROV- she saw TP 3/16 w/ cough & mult somatic complaints trouble swallowing, throat closing, drainage, nasal congestion, "harking phlegm" etc;  CXR= stable, ectatic Ao, NAD;  Labs include BMet=wnl, BNP=110, UA=clear;  Rx w/ Zyrtek, Saline, Nasonex, Pepcid, Mucinex, & keep appt w/ DrStark for swallowing eval... Now c/o same constellation of symptoms more or less, never took the Zyrtek or Nasonex (too$$) & we discussed regimen w/ Zyrtek AM, Saline/ Mucinex during the day/ Flonase Qhs...    Dyspnea>  On Mucinex plus her anxiolytics (Alprazolam) but not taking regularly...    HBP>  On Coreg, Norvasc, Cozaar, Lasix & BP= 110/64 & well controlled, she thinks too many meds...    GI>  She has hx HH, gastritis, divertics, constip, & functional abd pain w/ mult extensive evaluations in the past;  On Omep20, Miralax, Senakot-S but she persists w/ c/o swelling & "something wrong in my stomach it's  getting bigger & bigger- I might have cancer"; last CT Abd 1/11 reviewed- divertics, otherw neg;  States her appetite is OK but weight down 12#;  C/o left side of abd hurts when she gets up to walk, no pain sitting or supine;  Offered additional meds (try Levsin or bentyl but she declines);  She has up coming appt w/ DrStark.    DJD>  Followed by DrWhitfield & she's had epid shots by DrNewton in the past    Anxiety>  On Alprazolam but she won't take it regularly for help w/ her severe anxiety & cancer phobia...  ~  June 12, 2011:  65mo ROV & she persists w/ mult somatic complaints & flight of ideas hx, "I have really suffered" she says...    GI> Hx HH, gastritis, divertics, & vague abd pains> she is not happy w/ DrStark's eval & sought second opinion (DrMann declined to see her); she saw DrJEdwards & his notes are reviewed;  Today she is quite distraught about papers to fill out for the GI dept at [facility]- DrNThorne...    Ortho> c/o pain in left leg w/ some edema (she has VI & dependent edema) & states her legs feel like lead; exam shows some incr in LE edema & we discussed incr in her Lasix from 20mg  to 40mg /d plus a 2gm sodium diet...    We rechecked her labs today> all WNL.Marland KitchenMarland Kitchen  ~  July 09, 2011:  28mo ROV & add-on due to sm area of dermatitis on her left leg, sl excoriated & sm scab==> her son is here today & told me she drove him crazy w/ her ruminations about this being a cancer & that we would send her to the cancer center, etc (she got angry at him for telling me); we discussed dermatitis & rec RX w/ LIDEX-E cream to apply Bid & rec Derm review if not resolved...    She is also quite concerned about a "bump" on the back of her head==> occipital bone & it's symmetric, she is reassured...    She has been to [facility] GI [facility] DrThorne & note is pending but she has lab sheet (all wnl) & is sch for EGD 9/12; they told her to stop Metamucil, and take Miralax, Senakot; she is most concerned/ upset by an evaluation  form she received asking questions like how long she waited for this or that during her visit & she doesn't know what to do or how to complete the info! I encouraged her to take her Alpraz regularly Tid...  ~  September 22, 2011:  2.4mo ROV & she states "terrible" c/o phlegm, swelling, feeling bad, wt loss, "this throat business- they found white balls"; very anxious w/ mult somatic complaints >>    HBP> on Norvasc2.5, Coreg3.125Bid, Losartan50, Lasix20-40/d; she went to the ER w/ ?angioedema of the tongue, seen by [facility] who called me & we decided to stop her Cozaar; BP today off Cozaar & on the other 3 meds showed 122/72> rec to continue the same...    GI> she saw DrThorne at [facility] 9/12 for a comprehensive GI eval> note reviewed- they did an EGD 08/06/11 w/ mult white nodular plaques seen in cardia & fundus- Bx'd & pending (she is very concerned);  she went to the ER 08/16/11 w/ abd discomfort- the same vague left side abd discomfort that she has seen mult physicians about- Exam showed mild diffuse tenderness;  they did CT Abd showing divertics, no inflamm, right renal & left hep lobe cysts, atherosclerotic changes, NAD;  Labs & urine were essentially WNL;  EKG showed NSR, RBBB & LAD, no acute changes...  she saw DrEdwards [facility]) 10/12 w/ mult complaints including "a knot on her skull & she feels that her brains are coming out"; he thought she was doing reasonably well from the GI standpoint w/ her dysphagia, vague abd pain, & constip...    Ortho> she saw DrWhitfield w/ MRI Lumbar spine 10/12 showing degenerative changes w/ mild progression since 7/10 MRI, mild-mod sp stenosis & lat recess stenosis at several levels; she has Vicodin to take up to 3/d as needed for pain; she is anxiously awaiting f/u visit w/ DrWhitfield...    Derm> DrLupton saw pt 07/23/11 w/ removal of sm squamous cell ca (in situ) from left lower leg  ~  December 29, 2011:  68mo ROV & [name] has her usual complement of somatic  complaints> all seem stable w/o appreciable change; she does not want to see DrJEdwards again, but is happy w/ eval & communication from DrThorne at [facility] says her abd pain is coming from the arthritis in her back, & she has seen DrWhitfield for that... She lists mult OTC GI meds including> Prilosec, Miralax, Metamucil, Senakot, Mylicon...    BP controlled on her Coreg, Amlodipine, Lasix; BP= 120/68 today & denies CP/ palpit, ch in DOE, edema, etc...    She saw DrThorne recently for  f/u & note pending; prev note 11/12 reviewed> she has done a thorough investigation...    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    03/10/2012 Acute OV  Complains of clear mucus production, some increased SOB x5-6 months  Worse at night, with drainage in her throat. Has to constantly cough to get mucus out of throat.  Using saline nasal rinses As needed  No fever or discolored mucus. No fever . Nasal stuffiness and drainage.   Also complains of chronic constipation, very focused on her bowel movements. Complains of hard stools despite miralax . Uses duclolax As needed  .  No abdominal pain or bloody stools.             Problem List:  DYSPNEA (ICD-786.9) - eval 5/11 c/o SOB- can't get a DB, can't get enough air in, etc... occurs at rest & w/ exerc "I'm very active despite my arthritis", talks rapidly in long sentences w/o apparent distress... no cough, phlegm, hemoptysis, etc- but as usual she is quite distressed by these symptoms and wants further eval> we discussed re-assessment w/ CXR (clear, NAD);  PFT (totally norm airflow);  Labs (all WNL);  Rx w/ ALPRAZOLAM 0.5mg  Tid & encouraged to take regularly.  HYPERTENSION (ICD-401.9) - on AMLODIPINE 2.5mg /d,  COREG 3.125mg Bid,  LASIX 40mg - taking 1/2tab/d because 1 tab makes her too weak... BP today= 122/72, takes meds regularly & tolerating  well... denies HA, visual changes, palipit, syncope, edema, etc... ~  7/12:  C/o incr edema in legs & advised to elim sodium from diet & incr her LASIX to 40mg /d... ~  10/12:  Prev Cozaar 50mg  was stopped 10/12 via the ER when she presented w/ ?angioedema of the tongue... ~  2/13:  BP= 120/68 & doing well on 3 med regimen & off Losartan.  Hx of CHEST PAIN (ICD-786.50) - she takes ASA 81mg /d + Tylenol Prn... followed by DrBensimhon for Cards. ~  Baseline EKG w/ RBBB & LAD, no acute changes... ~  NuclearStressTest done 2007 & 2009 showed normal- without scar or ischemia, EF=67%...   ~  2DEcho 7/08 showed mild AI, mild diastolic dysfunction, EF=55-60%. ~  CT Abd 1/09 & 9/12 showed incidental coronary calcif, & aorto-iliac atherosclerotic changes as well...  VENOUS INSUFFICIENCY (ICD-459.81) - she has mod VI and follows a low sodium diet, elevates legs when able, and wears support hose. ~  7/12:  C/o incr edema & we reviewed a 2gm sodium diet & increased her Lasix from 20mg /d to 40mg /d...  HIATAL HERNIA (ICD-553.3) & GASTRITIS (ICD-535.50) - on OMEPRAZOLE 20mg /d... ~  last EGD was 2/08 w/ 3cm HH & gastitis... ~  CT Abd 1/09 revealed calcif gallstones, & mild ectasia of AA... ~  CT Abd 1/11 showed left colon divertics, tiny hepatic lesions w/o change & right renal cyst stable. ~  CT Abd 9/12 showed divertics (no inflamm), right renal & left hep lobe cysts, atherosclerotic changes, NAD...  DIVERTICULOSIS, COLON (ICD-562.10) - followed regularly by DrStark. ~  colonoscopy 7/03 by DrStark showing divertics only... we discussed Miralax + Senakot-S for constipation. ~  colonoscopy 2/11 showed divertics, 3mm polyp, hems...  ? of ABDOMINAL PAIN & SYMPTOMATOLOGY - she had a very thorough work up in 2009> she has a cancer phobia and is very difficult to reassure her that everything appropriate has been done & no cancer found.Marland KitchenMarland Kitchen  Mult additional GI evals since then from [facility], [facility] (DrEdwards), [facility] (DrThorne)... ~  eval by GI- DrStark 12/08 but pt wasn't satisfied w/ his examination... ~  full labs 1/09 were WNL, sed=20... ~  CT Abd/ Pelvis 1/09= no acute abn in abd (calcif gallstones & mild ectasia of AA), calcif fibroid in the pelvis, & ?regarding the left ovary. ~  referral to DrFontaine, GYN> his notes are reviewed... ~  follow up w/ DrKimbrough, Urology> his notes are reviewed... ~  eval DrWhitfield for Orthopedics- rec shots in back by [facility] w/ some improvement. ~  repeat GI eval 1/11 by DrStark w/ CT & colonoscpy as above- no acute problems. ~  5/11 & 7/11 >> persist complaints w/ neg recheck by DrStark & DrKimbrough... ~  12/11:  routine GYN surveillance DrFontaine found anteverted uterus w/ fibroids, atrophic ovaries bilat, no mass. ~  Repeat GI eval 4/12 by DrStark> he tried to reassure the pt, felt some symptoms might be from poor abd wall muscle tone, rec continue Omep, laxatives... ~  6/12:  She sought another GI opinion DrEdwards> he did not feel she needed any additional testing... ~  She tells me she has yet another appt to see a gastroenterologist at [facility]==> seen by DrThorne 8/12 & note reviewed... ~  EGD by DrThorne 9/12 w/ mult whitish nodular plaques in cardia & fundus> Bx results pending & she is very concerned (remember her hx cancer phobia). ~  She continues to f/u w/ [facility]Thorne at [facility] & is happy w/ her eval...  RENAL CYST (ICD-593.2) - she has a 1 cm right renal cyst seen on CT in 2007 and followed by DrKimbrough... f/u CT Abd 1/09 - no change in the cyst... f/u renal ultrasound 1/10 w/ small bilat cysts... f/u CT Abd 1/11- no change in cyst. ~  she saw DrKimbrough 7/11 for LBP, microhematuria, urge incontinence- stable, no additional Rx. ~  she saw DrKimbrough 1/12 for urge incontinence, freq, nocturia> rec Desmopressin 0.2mg - 1/2 tab Qhs.  CARCINOMA, BREAST, LEFT (ICD-174.9) - Surgery 9/94 by DrBlievernicht w/ Left modified radical mastectomy... +estrogen  receptors and Rx w/ Tamoxifen... f/u mammograms all neg... breast exam on right has been neg- no nodules palpated...  DEGENERATIVE JOINT DISEASE (ICD-715.90) - on OTC meds + VICODIN Prn... she has end-stage OA of Right Knee per DrWhitfield treated w/ shots in the past... she has a knee brace and a cane for ambulation...  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 Rx w/ rest, heat, Percocet, and f/u w/ Ortho...  Hx of LOW BACK PAIN, CHRONIC (ICD-724.2) - eval DrWhitfield w/ rec for shots by DrNewton & she's feeling sl better & wears back brace... ~  5/10: mult somatic complaints- primarily c/o left side/ postero-lat rib pain...  Exam w/ tender left lat/ post ribs... Bone Scan= neg... ~  MRI Lumbar spine 7/10 by DrWhitfield... ~  Epidural steroid shot by DrNewton 8/10 & 1/11 ~  Repeat LBP eval by DrWhitfield w/ another MRI lumbar spine showing degenerative changes w/ mild progression since 7/10 MRI, mild-mod sp stenosis & lat recess stenosis at several levels ~  DrWhitfield referred her to [name] to consider more shots but she is not so inclined...  CARPAL TUNNEL SYNDROME, BILATERAL (ICD-354.0) - she had prev right carpal tunnel release by DrWhitfield and was c/o increasing discomfort in her left wrist despite wrist splint Qhs- had left carpal tunnel release 1/10 by DrWhitfield.Marland KitchenMarland Kitchen  ANXIETY (ICD-300.00) - on ALPRAZOLAM 0.5mg  Tid & encouraged to take it regularly... she has a cancer phobia & it is very difficult to reassure her regarding her symptoms and our evaluations...   Past Surgical History  Procedure Date  . Left mastectomy   . Carpal tunnel release     Outpatient Encounter Prescriptions as of 03/10/2012  Medication Sig Dispense Refill  . ALPRAZolam (XANAX) 0.5 MG tablet Take 1 tablet (0.5 mg total) by mouth 3 (three) times daily as needed.  90 tablet  5  . amLODipine (NORVASC) 2.5 MG tablet Take 1 tablet  (2.5 mg total) by mouth daily.  30 tablet  6  . aspirin 81 MG tablet Take 81 mg by mouth daily.        . carvedilol (COREG) 3.125 MG tablet TAKE ONE TABLET TWICE DAILY FOR BLOOD   PRESSURE  60 tablet  6  . Cholecalciferol (VITAMIN D3) 1000 UNITS tablet Take 1,000 Units by mouth daily.        . furosemide (LASIX) 40 MG tablet Take 1/2  To 1 tablet by mouth in the morning for swelling        . HYDROcodone-acetaminophen (VICODIN) 5-500 MG per tablet Take 1 tablet by mouth every 8 (eight) hours as needed for pain. For severe pain. NOT TO EXCEED THREE PER DAY.  90 tablet  5  . Multiple Vitamin (MULTIVITAMIN) capsule Take 1 capsule by mouth daily.        Marland Kitchen omeprazole (PRILOSEC) 20 MG capsule Take 20 mg by mouth daily.        . polyethylene glycol (MIRALAX / GLYCOLAX) packet Take 17 g by mouth 2 (two) times daily as needed.        . Pseudoephedrine-Guaifenesin (MUCINEX D PO) Take by mouth. As needed       . psyllium (METAMUCIL) 58.6 % powder Take 1 packet by mouth daily.        . sennosides-docusate sodium (SENOKOT-S) 8.6-50 MG tablet Take 1 tablet by mouth daily.        . simethicone (MYLICON) 80 MG chewable tablet Chew 80 mg by mouth. Take 1 tablet in afternoon after meal         Allergies  Allergen Reactions  . Fentanyl     REACTION: nausea    Current Medications, Allergies, Past Medical History, Past Surgical History, Family History, and Social History were reviewed in [employer] record.    Review of Systems     Constitutional:   No  weight loss, night sweats,  Fevers, chills +, fatigue, or  lassitude.  HEENT:   No headaches,  Difficulty swallowing,  Tooth/dental problems, or  Sore throat,                No sneezing, itching, ear ache, + nasal congestion, post nasal drip,   CV:  No chest pain,  Orthopnea, PND, swelling in lower extremities, anasarca, dizziness, palpitations, syncope.   GI  No heartburn, indigestion, abdominal pain, nausea, vomiting, diarrhea,   , loss of appetite, bloody stools.   Resp: No shortness of breath with exertion or at rest.  No excess mucus, no productive cough,  No non-productive cough,  No coughing up of blood.  No change in color of mucus.  No wheezing.  No chest wall deformity  Skin: no rash or lesions.  GU: no dysuria, change in color of urine, no urgency or frequency.  No flank pain, no hematuria   MS:  No joint pain  or swelling.  No decreased range of motion.  No back pain.  Psych:  No change in mood or affect. No depression or anxiety.  No memory loss.       Objective:   Physical Exam   WD, WN, 78 y/o BF in NAD... she is very anxious... GENERAL:  Alert & oriented; pleasant & cooperative... HEENT:  [place]/AT,  , EACs-clear, TMs-wnl, NOSE-clear, THROAT-clear & wnl, no lesions seen. NECK:   no JVD; normal carotid impulses w/o bruits; no thyromegaly or nodules palpated; no lymphadenopathy. CHEST:  Clear to P & A; without wheezes/ rales/ or rhonchi heard.  HEART:  Regular Rhythm; without murmurs/ rubs/ or gallops detected... ABDOMEN:  Soft & nontender; normal bowel sounds; no organomegaly or masses palpated... No guarding or rebound  EXT: without deformities, mod arthritic changes; no varicose veins/ +venous insuffic/ tr edema. NEURO:    no focal neuro deficits... DERM:  no rash   Assessment & Plan:

## 2012-03-10 NOTE — Patient Instructions (Addendum)
For constipation use :  Miralax Twice daily   Senokot  S Twice daily   Begin Align -Probiotic daily  May use Ducolax As needed  For constipation  Drink plenty of fluids   For congestion :  Use Dymista 2 puffs Twice daily  Until sample is gone. Saline nasal rinses Twice daily   Mucinex DM Twice daily  As needed  Congestion  Zyrtec 10mg  At bedtime    I will call with xray results.   follow up Dr. Kriste Basque  In 3 weeks as planned and As needed

## 2012-03-10 NOTE — Assessment & Plan Note (Signed)
Flare with rhinitis  Check xray today  Use Dymista 2 puffs Twice daily  Until sample is gone. Saline nasal rinses Twice daily   Mucinex DM Twice daily  As needed  Congestion  Zyrtec 10mg  At bedtime

## 2012-03-12 ENCOUNTER — Other Ambulatory Visit: Payer: Self-pay | Admitting: *Deleted

## 2012-03-12 MED ORDER — FUROSEMIDE 40 MG PO TABS: ORAL_TABLET | ORAL | Status: DC

## 2012-03-16 ENCOUNTER — Telehealth: Payer: Self-pay | Admitting: Pulmonary Disease

## 2012-03-16 MED ORDER — ALPRAZOLAM 0.5 MG PO TABS: 0.5000 mg | ORAL_TABLET | Freq: Three times a day (TID) | ORAL | Status: DC | PRN

## 2012-03-16 NOTE — Telephone Encounter (Signed)
Received refill request for alprazolam 0.5 mg # 90 Pt last had this filled on 01-13-12 Pt las seen by TP 03-10-12 and by SN on 12-29-11 Please advise if ok to refill, thanks!

## 2012-03-16 NOTE — Telephone Encounter (Signed)
Refill has been called to the pharmacy.  

## 2012-03-25 ENCOUNTER — Telehealth: Payer: Self-pay | Admitting: Pulmonary Disease

## 2012-03-25 NOTE — Telephone Encounter (Signed)
Per SN---this is perfect.  No salt, keep her feet elevated--stay off of her feet for 2 days, cont the lasix daily.  Will need to come in and see SN on Monday at 3pm.  thanks

## 2012-03-25 NOTE — Telephone Encounter (Signed)
I spoke pt and she states her lower legs, ankles, and feet have been swollen x 3 days. She is taken lasix 40 mg QD every morning, keeping legs elevated, and has cut back a lot with her salt intake. Pt is requesting further recs from SN. Please advise thanks  Allergies  Allergen Reactions  . Fentanyl     REACTION: nausea     [facility]

## 2012-03-25 NOTE — Telephone Encounter (Signed)
I spoke with pt and she is aware of SN recs. She already has an apt for Monday at 11 and will come in at that time. I advised pt to call back if it worsens before the weekend. She voiced her understanding and needed nothing further

## 2012-03-26 ENCOUNTER — Other Ambulatory Visit: Payer: Self-pay | Admitting: Pulmonary Disease

## 2012-03-26 ENCOUNTER — Telehealth: Payer: Self-pay | Admitting: Pulmonary Disease

## 2012-03-26 ENCOUNTER — Other Ambulatory Visit (INDEPENDENT_AMBULATORY_CARE_PROVIDER_SITE_OTHER): Payer: Medicare Other

## 2012-03-26 DIAGNOSIS — R300 Dysuria: Secondary | ICD-10-CM

## 2012-03-26 LAB — URINALYSIS, ROUTINE W REFLEX MICROSCOPIC
Ketones, ur: NEGATIVE
Specific Gravity, Urine: 1.015 (ref 1.000–1.030)
Total Protein, Urine: NEGATIVE
Urine Glucose: NEGATIVE
pH: 6 (ref 5.0–8.0)

## 2012-03-26 NOTE — Telephone Encounter (Signed)
Called and spoke with pt and she stated that her urine was dark in color today and said it looks like blood in her urine.  Pt was very concerned about this and requesting to come in and leave a sample.  Order has been placed in the computer  And pt will come by today to leave a specimen.

## 2012-03-29 ENCOUNTER — Encounter: Payer: Self-pay | Admitting: Pulmonary Disease

## 2012-03-29 ENCOUNTER — Ambulatory Visit (INDEPENDENT_AMBULATORY_CARE_PROVIDER_SITE_OTHER): Payer: Medicare Other | Admitting: Pulmonary Disease

## 2012-03-29 VITALS — BP 108/60 | HR 76 | Temp 97.0°F | Ht 64.0 in | Wt 158.4 lb

## 2012-03-29 DIAGNOSIS — M545 Low back pain, unspecified: Secondary | ICD-10-CM

## 2012-03-29 DIAGNOSIS — K5730 Diverticulosis of large intestine without perforation or abscess without bleeding: Secondary | ICD-10-CM

## 2012-03-29 DIAGNOSIS — I872 Venous insufficiency (chronic) (peripheral): Secondary | ICD-10-CM

## 2012-03-29 DIAGNOSIS — M47812 Spondylosis without myelopathy or radiculopathy, cervical region: Secondary | ICD-10-CM

## 2012-03-29 DIAGNOSIS — F411 Generalized anxiety disorder: Secondary | ICD-10-CM

## 2012-03-29 DIAGNOSIS — I10 Essential (primary) hypertension: Secondary | ICD-10-CM

## 2012-03-29 DIAGNOSIS — K219 Gastro-esophageal reflux disease without esophagitis: Secondary | ICD-10-CM

## 2012-03-29 DIAGNOSIS — K5900 Constipation, unspecified: Secondary | ICD-10-CM

## 2012-03-29 DIAGNOSIS — M1990 Unspecified osteoarthritis, unspecified site: Secondary | ICD-10-CM

## 2012-03-29 DIAGNOSIS — R0602 Shortness of breath: Secondary | ICD-10-CM

## 2012-03-29 DIAGNOSIS — C50919 Malignant neoplasm of unspecified site of unspecified female breast: Secondary | ICD-10-CM

## 2012-03-29 DIAGNOSIS — I519 Heart disease, unspecified: Secondary | ICD-10-CM

## 2012-03-29 NOTE — Progress Notes (Signed)
Subjective:    Patient ID: Heather [name], female    DOB: 1918-05-12, 78 y.o.   MRN: [id number]  HPI 78 y/o BF here for a follow up visit... she has multiple medical problems including:  Dyspnea & Anxiety;  HBP;  Hx CP followed by DrBensimhon;  HH/ Gastritis;  Divertics/ Constip;  Functional Abd Pain;  Renal cyst followed by DrKimbrough;  Hx left breast [phone];  DJD/ CSpine spondy/ LBP/ CTS; and Anxiety...  ~  December 26, 2010:  add-on appt due to right side pain x4d she says> "I feel fine, BUT..." states the right post CWP is unbearable, sharp, worse w/ movement, & denies trauma... area over the right 11-12th ribs is tender to palp & reproduces her discomfort;  chest is clear & no rubs w/ O2 sat 98% RA;  last CXR 9/11 clear, some scarring, ectatic Ao, DJD sp;  she wants to relate this all to her prev complaints of Abd pain & swelling, left flank pain, etc (recall extensive evals w/ nothing signif found to explain her varied symptoms)... ** we discussed rx w/ rest, heat, rib binder, Lidoderm patches, Vicodin Prn.  ~  February 10, 2011:  6wk ROV- she saw TP 3/16 w/ cough & mult somatic complaints trouble swallowing, throat closing, drainage, nasal congestion, "harking phlegm" etc;  CXR= stable, ectatic Ao, NAD;  Labs include BMet=wnl, BNP=110, UA=clear;  Rx w/ Zyrtek, Saline, Nasonex, Pepcid, Mucinex, & keep appt w/ DrStark for swallowing eval... Now c/o same constellation of symptoms more or less, never took the Zyrtek or Nasonex (too$$) & we discussed regimen w/ Zyrtek AM, Saline/ Mucinex during the day/ Flonase Qhs...    Dyspnea>  On Mucinex plus her anxiolytics (Alprazolam) but not taking regularly...    HBP>  On Coreg, Norvasc, Cozaar, Lasix & BP= 110/64 & well controlled, she thinks too many meds...    GI>  She has hx HH, gastritis, divertics, constip, & functional abd pain w/ mult extensive evaluations in the past;  On Omep20, Miralax, Senakot-S but she persists w/ c/o swelling & "something  wrong in my stomach it's getting bigger & bigger- I might have cancer"; last CT Abd 1/11 reviewed- divertics, otherw neg;  States her appetite is OK but weight down 12#;  C/o left side of abd hurts when she gets up to walk, no pain sitting or supine;  Offered additional meds (try Levsin or bentyl but she declines);  She has up coming appt w/ DrStark.    DJD>  Followed by DrWhitfield & she's had epid shots by DrNewton in the past    Anxiety>  On Alprazolam but she won't take it regularly for help w/ her severe anxiety & cancer phobia...  ~  June 12, 2011:  36mo ROV & she persists w/ mult somatic complaints & flight of ideas hx, "I have really suffered" she says...    GI> Hx HH, gastritis, divertics, & vague abd pains> she is not happy w/ DrStark's eval & sought second opinion (DrMann declined to see her); she saw DrJEdwards & his notes are reviewed;  Today she is quite distraught about papers to fill out for the GI dept at [facility]- DrNThorne...    Ortho> c/o pain in left leg w/ some edema (she has VI & dependent edema) & states her legs feel like lead; exam shows some incr in LE edema & we discussed incr in her Lasix from 20mg  to 40mg /d plus a 2gm sodium diet...    We rechecked her  labs today> all WNL.Marland Kitchen.  ~  July 09, 2011:  75mo ROV & add-on due to sm area of dermatitis on her left leg, sl excoriated & sm scab==> her son is here today & told me she drove him crazy w/ her ruminations about this being a cancer & that we would send her to the cancer center, etc (she got angry at him for telling me); we discussed dermatitis & rec RX w/ LIDEX-E cream to apply Bid & rec Derm review if not resolved...    She is also quite concerned about a "bump" on the back of her head==> occipital bone & it's symmetric, she is reassured...    She has been to [facility] DrThorne & note is pending but she has lab sheet (all wnl) & is sch for EGD 9/12; they told her to stop Metamucil, and take Miralax, Senakot; she is most concerned/  upset by an evaluation form she received asking questions like how long she waited for this or that during her visit & she doesn't know what to do or how to complete the info! I encouraged her to take her Alpraz regularly Tid...  ~  September 22, 2011:  2.77mo ROV & she states "terrible" c/o phlegm, swelling, feeling bad, wt loss, "this throat business- they found white balls"; very anxious w/ mult somatic complaints >>    HBP> on Norvasc2.5, Coreg3.125Bid, Losartan50, Lasix20-40/d; she went to the ER w/ ?angioedema of the tongue, seen by [facility] who called me & we decided to stop her Cozaar; BP today off Cozaar & on the other 3 meds showed 122/72> rec to continue the same...    GI> she saw [facility]Thorne at [facility] 9/12 for a comprehensive GI eval> note reviewed- they did an EGD 08/06/11 w/ mult white nodular plaques seen in cardia & fundus- Bx'd & pending (she is very concerned);  she went to the ER 08/16/11 w/ abd discomfort- the same vague left side abd discomfort that she has seen mult physicians about- Exam showed mild diffuse tenderness;  they did CT Abd showing divertics, no inflamm, right renal & left hep lobe cysts, atherosclerotic changes, NAD;  Labs & urine were essentially WNL;  EKG showed NSR, RBBB & LAD, no acute changes...  she saw DrEdwards [facility]) 10/12 w/ mult complaints including "a knot on her skull & she feels that her brains are coming out"; he thought she was doing reasonably well from the GI standpoint w/ her dysphagia, vague abd pain, & constip...    Ortho> she saw DrWhitfield w/ MRI Lumbar spine 10/12 showing degenerative changes w/ mild progression since 7/10 MRI, mild-mod sp stenosis & lat recess stenosis at several levels; she has Vicodin to take up to 3/d as needed for pain; she is anxiously awaiting f/u visit w/ DrWhitfield...    Derm> DrLupton saw pt 07/23/11 w/ removal of sm squamous cell ca (in situ) from left lower leg  ~  December 29, 2011:  23mo ROV & Heather [name] has her usual  complement of somatic complaints> all seem stable w/o appreciable change; she does not want to see DrJEdwards again, but is happy w/ eval & communication from DrThorne at [facility] says her abd pain is coming from the arthritis in her back, & she has seen DrWhitfield for that... She lists mult OTC GI meds including> Prilosec, Miralax, Metamucil, Senakot, Mylicon...    BP controlled on her Coreg, Amlodipine, Lasix; BP= 120/68 today & denies CP/ palpit, ch in DOE, edema, etc..Marland Kitchen  She saw DrThorne recently for f/u & note pending; prev note 11/12 reviewed> she has done a thorough investigation...    She saw DrWhitfield 12/12 for f/u LBP w/ known DJD & severe sp stenosis L4-5 & neurogenic claudication; she did PT & has a brace, he referred her to [facility] for shots...  ~  Mar 29, 2012:  18mo ROV & she continues w/ mult complaints- ran out of Dymista sample for nasal symptoms (didn't help), thinks her BP is too low, c/o swelling (has VI- reminded to elim sodium, wear support hose, elevate, take Lasix40), "constip" but stool is loose & soft (rec- Align), saw DrEdwards for GI- she denies dysphagia, still c/o chr abd discomfort, etc; c/o "dark urine" it was clear and C&S was neg; she requires constant reassurance about her condition & that she does not have cancer; asked to take her Xanax more regularly...  We reviewed prob list, meds, xrays and labs>  See below>>           Problem List:  DYSPNEA (ICD-786.9) - eval 5/11 c/o SOB- can't get a DB, can't get enough air in, etc... occurs at rest & w/ exerc "I'm very active despite my arthritis", talks rapidly in long sentences w/o apparent distress... no cough, phlegm, hemoptysis, etc- but as usual she is quite distressed by these symptoms and wants further eval> we discussed re-assessment w/ CXR (clear, NAD);  PFT (totally norm airflow);  Labs (all WNL);  Rx w/ ALPRAZOLAM 0.5mg  Tid & encouraged to take regularly. ~  CXR 4/13 showed norm heart size, clear lungs, mild TSpine  spondylosis...  HYPERTENSION (ICD-401.9) - on AMLODIPINE 2.5mg /d,  COREG 3.125mg Bid,  LASIX 40mg - taking 1/2tab/d because 1 tab makes her too weak...  ~  7/12:  C/o incr edema in legs & advised to elim sodium from diet & incr her LASIX to 40mg /d... ~  10/12:  Prev Cozaar 50mg  was stopped 10/12 via the ER when she presented w/ ?angioedema of the tongue... ~  2/13:  BP= 120/68 & doing well on 3 med regimen & off Losartan. ~  5/13:  BP= 110/60 & she has mult somatic complaints but denies HA, CP, palpit, ch in SOB or edema...  Hx of CHEST PAIN (ICD-786.50) - she takes ASA 81mg /d + Tylenol Prn... followed by DrBensimhon for Cards. ~  Baseline EKG w/ RBBB & LAD, no acute changes... ~  NuclearStressTest done 2007 & 2009 showed normal- without scar or ischemia, EF=67%...   ~  2DEcho 7/08 showed mild AI, mild diastolic dysfunction, EF=55-60%. ~  CT Abd 1/09 & 9/12 showed incidental coronary calcif, & aorto-iliac atherosclerotic changes as well...  VENOUS INSUFFICIENCY (ICD-459.81) - she has mod VI and follows a low sodium diet, elevates legs when able, and wears support hose. ~  7/12:  C/o incr edema & we reviewed a 2gm sodium diet & increased her Lasix from 20mg /d to 40mg /d... ~  5/13:  She has persist complaints but mild chr VI w/ min edema & reminded to elim sodium, elev, support hose, take the Lasix...  HIATAL HERNIA (ICD-553.3) & GASTRITIS (ICD-535.50) - on OMEPRAZOLE 20mg /d & followed by DrJEdwards for GI having fired DrStark & [facility] in past. ~  last EGD was 2/08 w/ 3cm HH & gastitis... ~  CT Abd 1/09 revealed calcif gallstones, & mild ectasia of AA... ~  CT Abd 1/11 showed left colon divertics, tiny hepatic lesions w/o change & right renal cyst stable. ~  CT Abd 9/12 showed divertics (no inflamm), right  renal & left hep lobe cysts, atherosclerotic changes, NAD... ~  Seen by DrEdwards 4/13> c/o dysphagia & congestion; he notes mult GI complaints, known esoph dysmotility w/o stricture, hard to pin  down her complaints...  DIVERTICULOSIS, COLON (ICD-562.10) - followed regularly by DrStark. ~  colonoscopy 7/03 by DrStark showing divertics only... we discussed Miralax + Senakot-S for constipation. ~  colonoscopy 2/11 showed divertics, 3mm polyp, hems...  ? of ABDOMINAL PAIN & SYMPTOMATOLOGY - she had a very thorough work up in 2009> she has a cancer phobia and is very difficult to reassure her that everything appropriate has been done & no cancer found... Mult additional GI evals since then from  GI, [facility] (DrEdwards), [facility] (DrThorne)... ~  eval by GI- DrStark 12/08 but pt wasn't satisfied w/ his examination... ~  full labs 1/09 were WNL, sed=20... ~  CT Abd/ Pelvis 1/09= no acute abn in abd (calcif gallstones & mild ectasia of AA), calcif fibroid in the pelvis, & ?regarding the left ovary. ~  referral to DrFontaine, GYN> his notes are reviewed... ~  follow up w/ DrKimbrough, Urology> his notes are reviewed... ~  eval DrWhitfield for Orthopedics- rec shots in back by [facility] w/ some improvement. ~  repeat GI eval 1/11 by DrStark w/ CT & colonoscpy as above- no acute problems. ~  5/11 & 7/11 >> persist complaints w/ neg recheck by DrStark & DrKimbrough... ~  12/11:  routine GYN surveillance DrFontaine found anteverted uterus w/ fibroids, atrophic ovaries bilat, no mass. ~  Repeat GI eval 4/12 by DrStark> he tried to reassure the pt, felt some symptoms might be from poor abd wall muscle tone, rec continue Omep, laxatives... ~  6/12:  She sought another GI opinion DrEdwards> he did not feel she needed any additional testing... ~  She tells me she has yet another appt to see a gastroenterologist at [facility]==> seen by DrThorne 8/12 & note reviewed... ~  EGD by DrThorne 9/12 w/ mult whitish nodular plaques in cardia & fundus> Bx results pending & she is very concerned (remember her hx cancer phobia). ~  She continues to f/u w/ DrThorne at [facility] & is happy w/ her eval...  RENAL CYST  (ICD-593.2) - she has a 1 cm right renal cyst seen on CT in 2007 and followed by DrKimbrough... f/u CT Abd 1/09 - no change in the cyst... f/u renal ultrasound 1/10 w/ small bilat cysts... f/u CT Abd 1/11- no change in cyst. ~  she saw DrKimbrough 7/11 for LBP, microhematuria, urge incontinence- stable, no additional Rx. ~  she saw DrKimbrough 1/12 for urge incontinence, freq, nocturia> rec Desmopressin 0.2mg - 1/2 tab Qhs.  CARCINOMA, BREAST, LEFT (ICD-174.9) - Surgery 9/94 by DrBlievernicht w/ Left modified radical mastectomy... +estrogen receptors and Rx w/ Tamoxifen... f/u mammograms all neg... breast exam on right has been neg- no nodules palpated...  DEGENERATIVE JOINT DISEASE (ICD-715.90) - on OTC meds + VICODIN Prn... she has end-stage OA of Right Knee per DrWhitfield treated w/ shots in the past... she has a knee brace and a cane for ambulation...  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 Rx w/ rest, heat, Percocet, and f/u w/ Ortho...  Hx of LOW BACK PAIN, CHRONIC (ICD-724.2) - eval DrWhitfield w/ rec for shots by DrNewton & she's feeling sl better & wears back brace... ~  5/10: mult somatic complaints- primarily c/o left side/ postero-lat rib pain...  Exam w/ tender left lat/  post ribs... Bone Scan= neg... ~  MRI Lumbar spine 7/10 by DrWhitfield... ~  Epidural steroid shot by DrNewton 8/10 & 1/11 ~  Repeat LBP eval by DrWhitfield w/ another MRI lumbar spine showing degenerative changes w/ mild progression since 7/10 MRI, mild-mod sp stenosis & lat recess stenosis at several levels ~  DrWhitfield referred her to [facility] to consider more shots but she is not so inclined...  CARPAL TUNNEL SYNDROME, BILATERAL (ICD-354.0) - she had prev right carpal tunnel release by DrWhitfield and was c/o increasing discomfort in her left wrist despite wrist splint Qhs- had left carpal tunnel release 1/10 by  DrWhitfield...  ANXIETY (ICD-300.00) - on ALPRAZOLAM 0.5mg  Tid & encouraged to take it regularly... she has a cancer phobia & it is very difficult to reassure her regarding her symptoms and our evaluations...   Past Surgical History  Procedure Date  . Left mastectomy   . Carpal tunnel release     Outpatient Encounter Prescriptions as of 03/29/2012  Medication Sig Dispense Refill  . ALPRAZolam (XANAX) 0.5 MG tablet Take 1 tablet (0.5 mg total) by mouth 3 (three) times daily as needed for anxiety.  90 tablet  5  . amLODipine (NORVASC) 2.5 MG tablet Take 1 tablet (2.5 mg total) by mouth daily.  30 tablet  6  . aspirin 81 MG tablet Take 81 mg by mouth daily.        . carvedilol (COREG) 3.125 MG tablet TAKE ONE TABLET TWICE DAILY FOR BLOOD   PRESSURE  60 tablet  6  . Cholecalciferol (VITAMIN D3) 1000 UNITS tablet Take 1,000 Units by mouth daily.        . furosemide (LASIX) 40 MG tablet Take 1/2  To 1 tablet by mouth in the morning for swelling  30 tablet  3  . HYDROcodone-acetaminophen (VICODIN) 5-500 MG per tablet Take 1 tablet by mouth every 8 (eight) hours as needed for pain. For severe pain. NOT TO EXCEED THREE PER DAY.  90 tablet  5  . Multiple Vitamin (MULTIVITAMIN) capsule Take 1 capsule by mouth daily.        Marland Kitchen omeprazole (PRILOSEC) 20 MG capsule Take 20 mg by mouth daily.        . polyethylene glycol (MIRALAX / GLYCOLAX) packet Take 17 g by mouth 2 (two) times daily as needed.        . Pseudoephedrine-Guaifenesin (MUCINEX D PO) Take by mouth. As needed       . sennosides-docusate sodium (SENOKOT-S) 8.6-50 MG tablet Take 1 tablet by mouth daily.        . simethicone (MYLICON) 80 MG chewable tablet Chew 80 mg by mouth. Take 1 tablet in afternoon after meal       . DISCONTD: psyllium (METAMUCIL) 58.6 % powder Take 1 packet by mouth daily.          Allergies  Allergen Reactions  . Fentanyl     REACTION: nausea    Current Medications, Allergies, Past Medical History, Past Surgical  History, Family History, and Social History were reviewed in [employer] record.    Review of Systems      See HPI - all other systems neg except as noted... She has mult somatic complaints and anxiety. The patient complains of decreased hearing, chest discomfort, dyspnea on exertion, abdominal pain, incontinence, muscle weakness, and difficulty walking.  The patient denies anorexia, fever, vision loss, hoarseness, syncope, peripheral edema, prolonged cough, headaches, hemoptysis, melena, hematochezia, severe indigestion/heartburn, hematuria, transient blindness, depression,  abnormal bleeding, enlarged lymph nodes, and angioedema.     Objective:   Physical Exam   WD, WN, 78 y/o BF in NAD... she is very anxious... GENERAL:  Alert & oriented; pleasant & cooperative... HEENT:  [place]/AT, EOM-wnl, PERRLA, EACs-clear, TMs-wnl, NOSE-clear, THROAT-clear & wnl, no lesions seen. NECK:  Neck ROM decreased, no JVD; normal carotid impulses w/o bruits; no thyromegaly or nodules palpated; no lymphadenopathy. CHEST:  Clear to P & A; without wheezes/ rales/ or rhonchi heard... + tender right lat 11th & 12th ribs on palp. BREAST:  s/p left mastectomy... right breast normal- without nodules palpated... HEART:  Regular Rhythm; without murmurs/ rubs/ or gallops detected... ABDOMEN:  Soft & nontender; normal bowel sounds; no organomegaly or masses palpated... EXT: without deformities, mod arthritic changes; no varicose veins/ +venous insuffic/ tr edema. NEURO:  CN's intact; no focal neuro deficits... DERM:  Small sl excoriated area of dermatitis left shin- no drainage,no rash, etc...  RADIOLOGY DATA:  Reviewed in the EPIC EMR & discussed w/ the patient...  LABORATORY DATA:  Reviewed in the EPIC EMR & discussed w/ the patient...   Assessment & Plan:   GI> HH, Gastritis, Divertics, vague abd pain>  I tried once again to reassure her about the thorough GI evaluations that her has undergone;  she is happy w/ DrThorne's eval & will f/u w/ her & in the meanwhile continue rx w/ Prilosec, Miralax, Metamucil, Senakot, Mylicon, etc...   Dyspnea>  She had a neg pulm eval 2011 as above; pt very anxious & Alprazolam seems to help when she takes it...  HBP>  Controlled on low doses of several meds including Coreg, Norvasc & Lasix;  She is encouraged to take meds regularly & maintain regular follow up visits...  Hx CP> evaluated for Cards by DrBensimon & he has not been in favor of invasive testing; stable on conservative med rx...  VI/ Edema>  She is concerned about incr edema & we reviewed 2gm sodium diet & rec keep Lasix at 40mg /d...  Renal cyst>  This is benign & has been followed by DrKimbrough...  Hx left Breast Cancer>  No known recurrence...  DJD> Cervical spondylosis & chronic LBP>  She has been evaluated by DrWhitfield for Ortho & given epid steroid shots by DrNewton...  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  Anxiety>  Asked once again to increase the Alprazolam to 1/2 - 1 tab TID regularly...   Patient's Medications  New Prescriptions   No medications on file  Previous Medications   ALPRAZOLAM (XANAX) 0.5 MG TABLET    Take 1 tablet (0.5 mg total) by mouth 3 (three) times daily as needed for anxiety.   AMLODIPINE (NORVASC) 2.5 MG TABLET    Take 1 tablet (2.5 mg total) by mouth daily.   ASPIRIN 81 MG TABLET    Take 81 mg by mouth daily.     CARVEDILOL (COREG) 3.125 MG TABLET    TAKE ONE TABLET TWICE DAILY FOR BLOOD   PRESSURE   CHOLECALCIFEROL (VITAMIN D3) 1000 UNITS TABLET    Take 1,000 Units by mouth daily.     FUROSEMIDE (LASIX) 40 MG TABLET    Take 1/2  To 1 tablet by mouth in the morning for swelling   HYDROCODONE-ACETAMINOPHEN (VICODIN) 5-500 MG PER TABLET    Take 1 tablet by mouth every 8 (eight) hours as needed for pain. For severe pain. NOT TO EXCEED THREE PER DAY.  MULTIPLE VITAMIN (MULTIVITAMIN) CAPSULE     Take 1 capsule by mouth daily.     OMEPRAZOLE (PRILOSEC) 20 MG CAPSULE    Take 20 mg by mouth daily.     POLYETHYLENE GLYCOL (MIRALAX / GLYCOLAX) PACKET    Take 17 g by mouth 2 (two) times daily as needed.     PSEUDOEPHEDRINE-GUAIFENESIN (MUCINEX D PO)    Take by mouth. As needed    SENNOSIDES-DOCUSATE SODIUM (SENOKOT-S) 8.6-50 MG TABLET    Take 1 tablet by mouth daily.     SIMETHICONE (MYLICON) 80 MG CHEWABLE TABLET    Chew 80 mg by mouth. Take 1 tablet in afternoon after meal   Modified Medications   No medications on file  Discontinued Medications   PSYLLIUM (METAMUCIL) 58.6 % POWDER    Take 1 packet by mouth daily.

## 2012-03-29 NOTE — Patient Instructions (Signed)
Today we updated your med list in our EPIC system...    Continue your current medications the same...   Today we discussed:     1) trying to help the Mucous problems by taking MUCINEX 600mg - one tab 4 times daily w/ lots of fluids.Marland KitchenMarland Kitchen     2) helping the bowel movements w/ ALIGN (OTC- one daily) and ACTIVIA Yogurt as needed...     3) Helping your aches & pains w/ TRAMADOL 50mg  up to 3 times daily, vs ALEVE OTC 1-2 tabs 3 time daily (and determine which of these works best for you).Marland Kitchen.     4) take the LASIX 40mg  each AM for the swelling; coupled w/ your excellent NO SALT DIET will allow the diuretic to pull the fluid out of your system...   Let's plan another follow up visit in 3 months.Marland KitchenMarland Kitchen

## 2012-04-02 ENCOUNTER — Telehealth: Payer: Self-pay | Admitting: Pulmonary Disease

## 2012-04-02 NOTE — Telephone Encounter (Signed)
Pt understands she will need to bring the jury summons in for SN to see and will call her when letter is complete if SN decides to do one. Heather [name] Duty is to be on April 18, 2012.

## 2012-04-02 NOTE — Telephone Encounter (Signed)
Will forward to leigh and letter has been taped to [employer]

## 2012-04-05 ENCOUNTER — Encounter: Payer: Self-pay | Admitting: *Deleted

## 2012-04-05 NOTE — Telephone Encounter (Signed)
Called and spoke with pt and she is aware of letter that has been completed and will be mailed in for her at this time.

## 2012-04-09 ENCOUNTER — Encounter: Payer: Self-pay | Admitting: Pulmonary Disease

## 2012-05-10 ENCOUNTER — Encounter: Payer: Self-pay | Admitting: Adult Health

## 2012-05-10 ENCOUNTER — Ambulatory Visit (INDEPENDENT_AMBULATORY_CARE_PROVIDER_SITE_OTHER)
Admission: RE | Admit: 2012-05-10 | Discharge: 2012-05-10 | Disposition: A | Discharge status: Home / Self Care | Payer: Medicare Other | Source: Ambulatory Visit | Attending: Adult Health | Admitting: Adult Health

## 2012-05-10 ENCOUNTER — Ambulatory Visit (INDEPENDENT_AMBULATORY_CARE_PROVIDER_SITE_OTHER): Payer: Medicare Other | Admitting: Adult Health

## 2012-05-10 ENCOUNTER — Telehealth: Payer: Self-pay | Admitting: Pulmonary Disease

## 2012-05-10 VITALS — BP 120/68 | HR 84 | Temp 97.2°F | Ht 64.0 in | Wt 155.0 lb

## 2012-05-10 DIAGNOSIS — J310 Chronic rhinitis: Secondary | ICD-10-CM

## 2012-05-10 DIAGNOSIS — R042 Hemoptysis: Secondary | ICD-10-CM

## 2012-05-10 MED ORDER — MOMETASONE FUROATE 50 MCG/ACT NA SUSP: 1.0000 | Freq: Two times a day (BID) | NASAL | Status: DC

## 2012-05-10 NOTE — Telephone Encounter (Signed)
I spoke with pt and she c/o having to hock up thick white phlem mixed with 'lumps" of blood in it, legs are very tender to touch, off and on hot flashed, and her loose stool may have possible blood in it as well x Tuesday. Pt is requesting to be seen today. She is scheduled to see TP at 3:15.

## 2012-05-10 NOTE — Patient Instructions (Addendum)
Saline nasal rinses Twice daily   Mucinex DM Twice daily  As needed  Congestion  Restart Zyrtec 10mg  At bedtime   Begin Nasonex 1 puff Twice daily   Please contact office for sooner follow up if symptoms do not improve or worsen or seek emergency care

## 2012-05-11 DIAGNOSIS — J310 Chronic rhinitis: Secondary | ICD-10-CM | POA: Insufficient documentation

## 2012-05-11 NOTE — Progress Notes (Signed)
Subjective:    Patient ID: Heather [name], female    DOB: 02/12/1918, 78 y.o.   MRN: [id number]  HPI 78 y/o BF >> multiple medical problems including:  Dyspnea & Anxiety;  HBP;  Hx CP followed by DrBensimhon;  HH/ Gastritis;  Divertics/ Constip;  Functional Abd Pain;  Renal cyst followed by DrKimbrough;  Hx left breast [phone];  DJD/ CSpine spondy/ LBP/ CTS; and Anxiety...  ~  December 26, 2010:  add-on appt due to right side pain x4d she says> "I feel fine, BUT..." states the right post CWP is unbearable, sharp, worse w/ movement, & denies trauma... area over the right 11-12th ribs is tender to palp & reproduces her discomfort;  chest is clear & no rubs w/ O2 sat 98% RA;  last CXR 9/11 clear, some scarring, ectatic Ao, DJD sp;  she wants to relate this all to her prev complaints of Abd pain & swelling, left flank pain, etc (recall extensive evals w/ nothing signif found to explain her varied symptoms)... ** we discussed rx w/ rest, heat, rib binder, Lidoderm patches, Vicodin Prn.  ~  February 10, 2011:  6wk ROV- she saw TP 3/16 w/ cough & mult somatic complaints trouble swallowing, throat closing, drainage, nasal congestion, "harking phlegm" etc;  CXR= stable, ectatic Ao, NAD;  Labs include BMet=wnl, BNP=110, UA=clear;  Rx w/ Zyrtek, Saline, Nasonex, Pepcid, Mucinex, & keep appt w/ DrStark for swallowing eval... Now c/o same constellation of symptoms more or less, never took the Zyrtek or Nasonex (too$$) & we discussed regimen w/ Zyrtek AM, Saline/ Mucinex during the day/ Flonase Qhs...    Dyspnea>  On Mucinex plus her anxiolytics (Alprazolam) but not taking regularly...    HBP>  On Coreg, Norvasc, Cozaar, Lasix & BP= 110/64 & well controlled, she thinks too many meds...    GI>  She has hx HH, gastritis, divertics, constip, & functional abd pain w/ mult extensive evaluations in the past;  On Omep20, Miralax, Senakot-S but she persists w/ c/o swelling & "something wrong in my stomach it's getting  bigger & bigger- I might have cancer"; last CT Abd 1/11 reviewed- divertics, otherw neg;  States her appetite is OK but weight down 12#;  C/o left side of abd hurts when she gets up to walk, no pain sitting or supine;  Offered additional meds (try Levsin or bentyl but she declines);  She has up coming appt w/ DrStark.    DJD>  Followed by DrWhitfield & she's had epid shots by DrNewton in the past    Anxiety>  On Alprazolam but she won't take it regularly for help w/ her severe anxiety & cancer phobia...  ~  June 12, 2011:  94mo ROV & she persists w/ mult somatic complaints & flight of ideas hx, "I have really suffered" she says...    GI> Hx HH, gastritis, divertics, & vague abd pains> she is not happy w/ DrStark's eval & sought second opinion (DrMann declined to see her); she saw DrJEdwards & his notes are reviewed;  Today she is quite distraught about papers to fill out for the GI dept at [facility]- DrNThorne...    Ortho> c/o pain in left leg w/ some edema (she has VI & dependent edema) & states her legs feel like lead; exam shows some incr in LE edema & we discussed incr in her Lasix from 20mg  to 40mg /d plus a 2gm sodium diet...    We rechecked her labs today> all WNL.Marland KitchenMarland Kitchen  ~  July 09, 2011:  64mo ROV & add-on due to sm area of dermatitis on her left leg, sl excoriated & sm scab==> her son is here today & told me she drove him crazy w/ her ruminations about this being a cancer & that we would send her to the cancer center, etc (she got angry at him for telling me); we discussed dermatitis & rec RX w/ LIDEX-E cream to apply Bid & rec Derm review if not resolved...    She is also quite concerned about a "bump" on the back of her head==> occipital bone & it's symmetric, she is reassured...    She has been to [facility] GI Dept DrThorne & note is pending but she has lab sheet (all wnl) & is sch for EGD 9/12; they told her to stop Metamucil, and take Miralax, Senakot; she is most concerned/ upset by an evaluation form she  received asking questions like how long she waited for this or that during her visit & she doesn't know what to do or how to complete the info! I encouraged her to take her Alpraz regularly Tid...  ~  September 22, 2011:  2.70mo ROV & she states "terrible" c/o phlegm, swelling, feeling bad, wt loss, "this throat business- they found white balls"; very anxious w/ mult somatic complaints >>    HBP> on Norvasc2.5, Coreg3.125Bid, Losartan50, Lasix20-40/d; she went to the ER w/ ?angioedema of the tongue, seen by [facility] who called me & we decided to stop her Cozaar; BP today off Cozaar & on the other 3 meds showed 122/72> rec to continue the same...    GI> she saw DrThorne at [facility] 9/12 for a comprehensive GI eval> note reviewed- they did an EGD 08/06/11 w/ mult white nodular plaques seen in cardia & fundus- Bx'd & pending (she is very concerned);  she went to the ER 08/16/11 w/ abd discomfort- the same vague left side abd discomfort that she has seen mult physicians about- Exam showed mild diffuse tenderness;  they did CT Abd showing divertics, no inflamm, right renal & left hep lobe cysts, atherosclerotic changes, NAD;  Labs & urine were essentially WNL;  EKG showed NSR, RBBB & LAD, no acute changes...  she saw DrEdwards [facility]) 10/12 w/ mult complaints including "a knot on her skull & she feels that her brains are coming out"; he thought she was doing reasonably well from the GI standpoint w/ her dysphagia, vague abd pain, & constip...    Ortho> she saw DrWhitfield w/ MRI Lumbar spine 10/12 showing degenerative changes w/ mild progression since 7/10 MRI, mild-mod sp stenosis & lat recess stenosis at several levels; she has Vicodin to take up to 3/d as needed for pain; she is anxiously awaiting f/u visit w/ DrWhitfield...    Derm> DrLupton saw pt 07/23/11 w/ removal of sm squamous cell ca (in situ) from left lower leg  ~  December 29, 2011:  89mo ROV & Heather [name] has her usual complement of somatic complaints> all  seem stable w/o appreciable change; she does not want to see DrJEdwards again, but is happy w/ eval & communication from DrThorne at [facility] says her abd pain is coming from the arthritis in her back, & she has seen DrWhitfield for that... She lists mult OTC GI meds including> Prilosec, Miralax, Metamucil, Senakot, Mylicon...    BP controlled on her Coreg, Amlodipine, Lasix; BP= 120/68 today & denies CP/ palpit, ch in DOE, edema, etc...    She saw DrThorne recently for  f/u & note pending; prev note 11/12 reviewed> she has done a thorough investigation...    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  ~  Mar 29, 2012:  80mo ROV & she continues w/ mult complaints- ran out of Dymista sample for nasal symptoms (didn't help), thinks her BP is too low, c/o swelling (has VI- reminded to elim sodium, wear support hose, elevate, take Lasix40), "constip" but stool is loose & soft (rec- Align), saw DrEdwards for GI- she denies dysphagia, still c/o chr abd discomfort, etc; c/o "dark urine" it was clear and C&S was neg; she requires constant reassurance about her condition & that she does not have cancer; asked to take her Xanax more regularly...  We reviewed prob list, meds, xrays and labs>  See below>>   05/10/12 Acute OV  Returns for work in visit.  Complains of persistent drainage in throat that she has to "hark" up.  No real cough.  Saw mixed blood in mucus that she "harked" up.  No fever or chest pain .  Had similar episode couple of months ago, no significant blood seen -xray was ok.  She is concerned she may have cancer -hx of breast cancer           Problem List:  DYSPNEA (ICD-786.9) - eval 5/11 c/o SOB- can't get a DB, can't get enough air in, etc... occurs at rest & w/ exerc "I'm very active despite my arthritis", talks rapidly in long sentences w/o apparent distress... no  cough, phlegm, hemoptysis, etc- but as usual she is quite distressed by these symptoms and wants further eval> we discussed re-assessment w/ CXR (clear, NAD);  PFT (totally norm airflow);  Labs (all WNL);  Rx w/ ALPRAZOLAM 0.5mg  Tid & encouraged to take regularly. ~  CXR 4/13 showed norm heart size, clear lungs, mild TSpine spondylosis...  HYPERTENSION (ICD-401.9) - on AMLODIPINE 2.5mg /d,  COREG 3.125mg Bid,  LASIX 40mg - taking 1/2tab/d because 1 tab makes her too weak...  ~  7/12:  C/o incr edema in legs & advised to elim sodium from diet & incr her LASIX to 40mg /d... ~  10/12:  Prev Cozaar 50mg  was stopped 10/12 via the ER when she presented w/ ?angioedema of the tongue... ~  2/13:  BP= 120/68 & doing well on 3 med regimen & off Losartan. ~  5/13:  BP= 110/60 & she has mult somatic complaints but denies HA, CP, palpit, ch in SOB or edema...  Hx of CHEST PAIN (ICD-786.50) - she takes ASA 81mg /d + Tylenol Prn... followed by DrBensimhon for Cards. ~  Baseline EKG w/ RBBB & LAD, no acute changes... ~  NuclearStressTest done 2007 & 2009 showed normal- without scar or ischemia, EF=67%...   ~  2DEcho 7/08 showed mild AI, mild diastolic dysfunction, EF=55-60%. ~  CT Abd 1/09 & 9/12 showed incidental coronary calcif, & aorto-iliac atherosclerotic changes as well...  VENOUS INSUFFICIENCY (ICD-459.81) - she has mod VI and follows a low sodium diet, elevates legs when able, and wears support hose. ~  7/12:  C/o incr edema & we reviewed a 2gm sodium diet & increased her Lasix from 20mg /d to 40mg /d... ~  5/13:  She has persist complaints but mild chr VI w/ min edema & reminded to elim sodium, elev, support hose, take the Lasix...  HIATAL HERNIA (ICD-553.3) & GASTRITIS (ICD-535.50) - on OMEPRAZOLE  20mg /d & followed by DrJEdwards for GI having fired DrStark & [facility] in past. ~  last EGD was 2/08 w/ 3cm HH & gastitis... ~  CT Abd 1/09 revealed calcif gallstones, & mild ectasia of AA... ~  CT Abd 1/11 showed left  colon divertics, tiny hepatic lesions w/o change & right renal cyst stable. ~  CT Abd 9/12 showed divertics (no inflamm), right renal & left hep lobe cysts, atherosclerotic changes, NAD... ~  Seen by DrEdwards 4/13> c/o dysphagia & congestion; he notes mult GI complaints, known esoph dysmotility w/o stricture, hard to pin down her complaints...  DIVERTICULOSIS, COLON (ICD-562.10) - followed regularly by DrStark. ~  colonoscopy 7/03 by DrStark showing divertics only... we discussed Miralax + Senakot-S for constipation. ~  colonoscopy 2/11 showed divertics, 3mm polyp, hems...  ? of ABDOMINAL PAIN & SYMPTOMATOLOGY - she had a very thorough work up in 2009> she has a cancer phobia and is very difficult to reassure her that everything appropriate has been done & no cancer found... Mult additional GI evals since then from [facility], [facility] (DrEdwards), [facility] (DrThorne)... ~  eval by GI- DrStark 12/08 but pt wasn't satisfied w/ his examination... ~  full labs 1/09 were WNL, sed=20... ~  CT Abd/ Pelvis 1/09= no acute abn in abd (calcif gallstones & mild ectasia of AA), calcif fibroid in the pelvis, & ?regarding the left ovary. ~  referral to DrFontaine, GYN> his notes are reviewed... ~  follow up w/ DrKimbrough, Urology> his notes are reviewed... ~  eval DrWhitfield for Orthopedics- rec shots in back by [facility] w/ some improvement. ~  repeat GI eval 1/11 by DrStark w/ CT & colonoscpy as above- no acute problems. ~  5/11 & 7/11 >> persist complaints w/ neg recheck by DrStark & DrKimbrough... ~  12/11:  routine GYN surveillance DrFontaine found anteverted uterus w/ fibroids, atrophic ovaries bilat, no mass. ~  Repeat GI eval 4/12 by DrStark> he tried to reassure the pt, felt some symptoms might be from poor abd wall muscle tone, rec continue Omep, laxatives... ~  6/12:  She sought another GI opinion DrEdwards> he did not feel she needed any additional testing... ~  She tells me she has yet another  appt to see a gastroenterologist at [facility]==> seen by DrThorne 8/12 & note reviewed... ~  EGD by DrThorne 9/12 w/ mult whitish nodular plaques in cardia & fundus> Bx results pending & she is very concerned (remember her hx cancer phobia). ~  She continues to f/u w/ DrThorne at [facility] & is happy w/ her eval...  RENAL CYST (ICD-593.2) - she has a 1 cm right renal cyst seen on CT in 2007 and followed by DrKimbrough... f/u CT Abd 1/09 - no change in the cyst... f/u renal ultrasound 1/10 w/ small bilat cysts... f/u CT Abd 1/11- no change in cyst. ~  she saw DrKimbrough 7/11 for LBP, microhematuria, urge incontinence- stable, no additional Rx. ~  she saw DrKimbrough 1/12 for urge incontinence, freq, nocturia> rec Desmopressin 0.2mg - 1/2 tab Qhs.  CARCINOMA, BREAST, LEFT (ICD-174.9) - Surgery 9/94 by DrBlievernicht w/ Left modified radical mastectomy... +estrogen receptors and Rx w/ Tamoxifen... f/u mammograms all neg... breast exam on right has been neg- no nodules palpated...  DEGENERATIVE JOINT DISEASE (ICD-715.90) - on OTC meds + VICODIN Prn... she has end-stage OA of Right Knee per DrWhitfield treated w/ shots in the past... she has a knee brace and a cane for ambulation...  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 Rx w/ rest, heat, Percocet, and f/u w/ Ortho...  Hx of LOW BACK PAIN, CHRONIC (ICD-724.2) - eval DrWhitfield w/ rec for shots by DrNewton & she's feeling sl better & wears back brace... ~  5/10: mult somatic complaints- primarily c/o left side/ postero-lat rib pain...  Exam w/ tender left lat/ post ribs... Bone Scan= neg... ~  MRI Lumbar spine 7/10 by DrWhitfield... ~  Epidural steroid shot by DrNewton 8/10 & 1/11 ~  Repeat LBP eval by DrWhitfield w/ another MRI lumbar spine showing degenerative changes w/ mild progression since 7/10 MRI, mild-mod sp stenosis & lat recess stenosis at several levels ~  DrWhitfield  referred her to [facility] to consider more shots but she is not so inclined...  CARPAL TUNNEL SYNDROME, BILATERAL (ICD-354.0) - she had prev right carpal tunnel release by DrWhitfield and was c/o increasing discomfort in her left wrist despite wrist splint Qhs- had left carpal tunnel release 1/10 by DrWhitfield...  ANXIETY (ICD-300.00) - on ALPRAZOLAM 0.5mg  Tid & encouraged to take it regularly... she has a cancer phobia & it is very difficult to reassure her regarding her symptoms and our evaluations...   Past Surgical History  Procedure Date  . Left mastectomy   . Carpal tunnel release     Outpatient Encounter Prescriptions as of 05/10/2012  Medication Sig Dispense Refill  . ALPRAZolam (XANAX) 0.5 MG tablet Take 1 tablet (0.5 mg total) by mouth 3 (three) times daily as needed for anxiety.  90 tablet  5  . amLODipine (NORVASC) 2.5 MG tablet Take 1 tablet (2.5 mg total) by mouth daily.  30 tablet  6  . aspirin 81 MG tablet Take 81 mg by mouth daily.        . carvedilol (COREG) 3.125 MG tablet TAKE ONE TABLET TWICE DAILY FOR BLOOD   PRESSURE  60 tablet  6  . Cholecalciferol (VITAMIN D3) 1000 UNITS tablet Take 1,000 Units by mouth daily.        . furosemide (LASIX) 40 MG tablet Take 1/2  To 1 tablet by mouth in the morning for swelling  30 tablet  3  . guaiFENesin (MUCINEX) 600 MG 12 hr tablet Take 1,200 mg by mouth 2 (two) times daily.      . Multiple Vitamin (MULTIVITAMIN) capsule Take 1 capsule by mouth daily.        Marland Kitchen omeprazole (PRILOSEC) 20 MG capsule Take 20 mg by mouth daily.        . polyethylene glycol (MIRALAX / GLYCOLAX) packet Take 17 g by mouth 2 (two) times daily as needed.        . sennosides-docusate sodium (SENOKOT-S) 8.6-50 MG tablet Take 1 tablet by mouth daily.        . simethicone (MYLICON) 80 MG chewable tablet Chew 80 mg by mouth. Take 1 tablet in afternoon after meal       . HYDROcodone-acetaminophen (VICODIN) 5-500 MG per tablet Take 1 tablet by mouth every 8 (eight)  hours as needed for pain. For severe pain. NOT TO EXCEED THREE PER DAY.  90 tablet  5  . mometasone (NASONEX) 50 MCG/ACT nasal spray Place 1 spray into the nose 2 (two) times daily.  17 g  11  . DISCONTD: Pseudoephedrine-Guaifenesin (MUCINEX D PO) Take by mouth. As needed         Allergies  Allergen Reactions  . Fentanyl     REACTION: nausea  Current Medications, Allergies, Past Medical History, Past Surgical History, Family History, and Social History were reviewed in [employer] record.    Review of Systems      Constitutional:   No  weight loss, night sweats,  Fevers, chills, fatigue, or  lassitude.  HEENT:   No headaches,  Difficulty swallowing,  Tooth/dental problems, or  Sore throat,                No sneezing, itching, ear ache, nasal congestion, post nasal drip,   CV:  No chest pain,  Orthopnea, PND, swelling in lower extremities, anasarca, dizziness, palpitations, syncope.   GI  No heartburn, indigestion, abdominal pain, nausea, vomiting, diarrhea, change in bowel habits, loss of appetite, bloody stools.   Resp: No shortness of breath with exertion or at rest.  No excess mucus, no productive cough,  No non-productive cough,  No coughing up of blood.  No change in color of mucus.  No wheezing.  No chest wall deformity  Skin: no rash or lesions.  GU: no dysuria, change in color of urine, no urgency or frequency.  No flank pain, no hematuria   MS:  No joint pain or swelling.  No decreased range of motion.  No back pain.  Psych:  No change in mood or affect. No depression or anxiety.  No memory loss.       Objective:   Physical Exam   WD, WN, 78 y/o BF in NAD...  GENERAL:  Alert & oriented; pleasant & cooperative... HEENT:  [place]/AT, EOM-wnl, PERRLA, EACs-clear, TMs-wnl, NOSE-clear, THROAT-clear & wnl, no lesions seen. NECK:  Neck ROM decreased, no JVD; normal carotid impulses w/o bruits; no thyromegaly or nodules palpated; no  lymphadenopathy. CHEST:  Clear to P & A; without wheezes/ rales/ or rhonchi heard.   HEART:  Regular Rhythm; without murmurs/ rubs/ or gallops detected... ABDOMEN:  Soft & nontender; normal bowel sounds; no organomegaly or masses palpated... EXT: without deformities, mod arthritic changes; no varicose veins/ +venous insuffic/ tr edema. NEURO:    no focal neuro deficits... DERM:   no rash, etc...    Assessment & Plan:

## 2012-05-11 NOTE — Assessment & Plan Note (Signed)
Flare  Xray checked again w/ no acute process noted.  Suspect this is drainage from her sinus   Plan;  saline nasal rinses Twice daily   Mucinex DM Twice daily  As needed  Congestion  Restart Zyrtec 10mg  At bedtime   Begin Nasonex 1 puff Twice daily   Please contact office for sooner follow up if symptoms do not improve or worsen or seek emergency care

## 2012-05-18 ENCOUNTER — Telehealth: Payer: Self-pay | Admitting: Internal Medicine

## 2012-05-18 NOTE — Telephone Encounter (Signed)
New  msg Pt was calling about seeing Dr Heather [name]. She hasnt seen him in a while. Does she need to got to chf clinic or see another provider.

## 2012-05-19 ENCOUNTER — Telehealth: Payer: Self-pay | Admitting: Pulmonary Disease

## 2012-05-19 DIAGNOSIS — R059 Cough, unspecified: Secondary | ICD-10-CM

## 2012-05-19 DIAGNOSIS — R05 Cough: Secondary | ICD-10-CM

## 2012-05-19 NOTE — Telephone Encounter (Signed)
Per SN and TP---pt will need appt with ENT and cont with currents recs until ov with ENT.  thanks

## 2012-05-19 NOTE — Telephone Encounter (Signed)
Pt needed verification of appt with dr Pollyann Kennedy 05/27/12@2 :00pm Tobe Sos

## 2012-05-19 NOTE — Telephone Encounter (Signed)
I spoke with pt and she states she is hacking up white thick phlem w/ specks of blood in it (she states she is not coughing it up she is having to hack it up) and slight increase in SOB w/ activity. Pt was in to see TP last week and advised to do saline rinses BID, mucinex DM BID, zyrtec 10 mg at bedtime and nasonex 1 puff BID-- pt states she is still doing all this. Please advise SN thanks  Allergies  Allergen Reactions  . Fentanyl     REACTION: nausea

## 2012-05-19 NOTE — Telephone Encounter (Signed)
Pt aware of RECS. She was fine with this and referral has been placed. Nothing further was needed

## 2012-05-19 NOTE — Telephone Encounter (Signed)
Spoke with pt, she will re-establish with another cardiologist here at [address].

## 2012-05-28 ENCOUNTER — Telehealth: Payer: Self-pay | Admitting: Pulmonary Disease

## 2012-05-28 NOTE — Telephone Encounter (Signed)
I spoke with pt and she went to ENT yesterday and does not seem to know what's going on. She states they did not find anything. She states she was told to "just drink water". She stated she drinks lots of water daily. Pt is wanting to see if she can have test for her "lungs" to make sure she did not have "cancer". Pt wants to know what's going on with her. Pt aware SN is out of the office today and will return next week. Please advise SN thanks

## 2012-05-28 NOTE — Telephone Encounter (Signed)
[email or web address]  

## 2012-05-28 NOTE — Telephone Encounter (Signed)
Returning call can be reached at [phone].Heather [name]

## 2012-05-31 NOTE — Telephone Encounter (Signed)
[email or web address]  

## 2012-05-31 NOTE — Telephone Encounter (Signed)
Per SN--she had a cxr done in 04/2012 and this would detect any cancer---her cxr was clear.   Her next appt with SN in 06/2012.  If she feels this is still her throat issues, then we can refer her for a 2nd opinion  At [facility].  thanks

## 2012-05-31 NOTE — Telephone Encounter (Signed)
Spoke with pt and notified of recs per SN. She verbalized understanding. States that she wants to see SN first and discuss [facility] ENT referral. Nothing further needed now per pt.

## 2012-06-07 ENCOUNTER — Telehealth: Payer: Self-pay | Admitting: Pulmonary Disease

## 2012-06-07 ENCOUNTER — Encounter (HOSPITAL_COMMUNITY): Payer: Self-pay | Admitting: *Deleted

## 2012-06-07 ENCOUNTER — Emergency Department (HOSPITAL_COMMUNITY)
Admission: EM | Admit: 2012-06-07 | Discharge: 2012-06-07 | Disposition: A | Discharge status: Home / Self Care | Payer: Medicare Other | Attending: Emergency Medicine | Admitting: Emergency Medicine

## 2012-06-07 ENCOUNTER — Emergency Department (HOSPITAL_COMMUNITY): Payer: Medicare Other

## 2012-06-07 DIAGNOSIS — M7989 Other specified soft tissue disorders: Secondary | ICD-10-CM

## 2012-06-07 DIAGNOSIS — R0602 Shortness of breath: Secondary | ICD-10-CM | POA: Insufficient documentation

## 2012-06-07 DIAGNOSIS — M1990 Unspecified osteoarthritis, unspecified site: Secondary | ICD-10-CM | POA: Insufficient documentation

## 2012-06-07 DIAGNOSIS — I10 Essential (primary) hypertension: Secondary | ICD-10-CM | POA: Insufficient documentation

## 2012-06-07 DIAGNOSIS — I4510 Unspecified right bundle-branch block: Secondary | ICD-10-CM | POA: Insufficient documentation

## 2012-06-07 DIAGNOSIS — Z853 Personal history of malignant neoplasm of breast: Secondary | ICD-10-CM | POA: Insufficient documentation

## 2012-06-07 DIAGNOSIS — Z79899 Other long term (current) drug therapy: Secondary | ICD-10-CM | POA: Insufficient documentation

## 2012-06-07 DIAGNOSIS — R220 Localized swelling, mass and lump, head: Secondary | ICD-10-CM

## 2012-06-07 LAB — CBC WITH DIFFERENTIAL/PLATELET
Basophils Absolute: 0 10*3/uL (ref 0.0–0.1)
Basophils Relative: 0 % (ref 0–1)
Eosinophils Relative: 2 % (ref 0–5)
Lymphocytes Relative: 37 % (ref 12–46)
MCHC: 32.9 g/dL (ref 30.0–36.0)
Neutro Abs: 3.5 10*3/uL (ref 1.7–7.7)
Platelets: 186 10*3/uL (ref 150–400)
RDW: 13.7 % (ref 11.5–15.5)
WBC: 6.8 10*3/uL (ref 4.0–10.5)

## 2012-06-07 LAB — COMPREHENSIVE METABOLIC PANEL
ALT: 15 U/L (ref 0–35)
AST: 24 U/L (ref 0–37)
Albumin: 3.3 g/dL — ABNORMAL LOW (ref 3.5–5.2)
CO2: 27 mEq/L (ref 19–32)
Calcium: 9.1 mg/dL (ref 8.4–10.5)
Chloride: 101 mEq/L (ref 96–112)
GFR calc non Af Amer: 47 mL/min — ABNORMAL LOW (ref >90)
Sodium: 139 mEq/L (ref 135–145)

## 2012-06-07 MED ORDER — PREDNISONE 20 MG PO TABS: 40.0000 mg | ORAL_TABLET | Freq: Every day | ORAL | Status: AC

## 2012-06-07 MED ORDER — PREDNISONE 20 MG PO TABS
40.0000 mg | ORAL_TABLET | Freq: Once | ORAL | Status: AC
  Administered 2012-06-07: 40 mg via ORAL
  Filled 2012-06-07: qty 2

## 2012-06-07 NOTE — ED Notes (Signed)
The pt says her tongue has been swollen since this am.  She has no other problems no sob no itching

## 2012-06-07 NOTE — Progress Notes (Signed)
VASCULAR LAB PRELIMINARY  PRELIMINARY  PRELIMINARY  PRELIMINARY  Right lower extremity venous Doppler completed.    Preliminary report:  There is no DVT or SVT noted in the right lower extremity.  Mechelle Pates, 06/07/2012, 7:16 PM

## 2012-06-07 NOTE — ED Notes (Signed)
Pt states swelling to tongue for one day. respirations easy non labored. Controlling secretions without difficulty.

## 2012-06-07 NOTE — Telephone Encounter (Signed)
Needs mucinex 600mg  tabs take 2 twice a day with lots of water, NO SALT!  and elevate legs

## 2012-06-07 NOTE — Telephone Encounter (Signed)
I spoke with pt and is aware of sn Recs.  She states she will continue to do what she is doing. Nothing further was needed

## 2012-06-07 NOTE — ED Provider Notes (Signed)
History     CSN: [id number]  Arrival date & time 06/07/12  1528   First MD Initiated Contact with Patient 06/07/12 1704      Chief Complaint  Patient presents with  . tongue swollen     (Consider location/radiation/quality/duration/timing/severity/associated sxs/prior treatment) The history is provided by the patient.   patient states that her tongue is swollen. She states it doesn't feel much bigger, but she looked in the mirror and thinks he looks big. She states she's never looked in the mirror at her tongue before except when she's been brushed her teeth. No trouble swallowing. She states she is having a little trouble breathing. She also states that her right leg is swollen. No chest pain. She states she's had swelling like this before and her medicines were changed. She states she called her primary doctor Dr. Marvel Plan was told to come in to the ER. No headache.  Past Medical History  Diagnosis Date  . RBBB (right bundle branch block with left anterior fascicular block)   . Hypertension   . Hiatal hernia   . Gastritis, chronic   . Breast cancer   . Degenerative joint disease   . Anxiety state, unspecified   . Low back pain   . Carpal tunnel syndrome   . Renal cyst   . Diverticula of colon   . Chest pain   . Cervical spondylosis   . Diastolic dysfunction   . Venous insufficiency   . Constipation     Past Surgical History  Procedure Date  . Left mastectomy   . Carpal tunnel release     Family History  Problem Relation Age of Onset  . Colon cancer Neg Hx     History  Substance Use Topics  . Smoking status: Never Smoker   . Smokeless tobacco: Never Used  . Alcohol Use: No    OB History    Grav Para Term Preterm Abortions TAB SAB Ect Mult Living                  Review of Systems  Constitutional: Negative for activity change and appetite change.  HENT: Negative for sore throat, trouble swallowing, neck stiffness and voice change.        Tongue swelling   Eyes: Negative for pain.  Respiratory: Positive for shortness of breath. Negative for chest tightness.   Cardiovascular: Positive for leg swelling. Negative for chest pain.  Gastrointestinal: Negative for nausea, vomiting, abdominal pain and diarrhea.  Genitourinary: Negative for flank pain.  Musculoskeletal: Negative for back pain.  Skin: Negative for rash.  Neurological: Negative for weakness, numbness and headaches.  Psychiatric/Behavioral: Negative for behavioral problems.    Allergies  Fentanyl  Home Medications   Current Outpatient Rx  Name Route Sig Dispense Refill  . ALPRAZOLAM 0.5 MG PO TABS Oral Take 0.5 mg by mouth 3 (three) times daily as needed. For anxiety    . AMLODIPINE BESYLATE 2.5 MG PO TABS Oral Take 2.5 mg by mouth daily.    . ASPIRIN EC 81 MG PO TBEC Oral Take 81 mg by mouth daily.    Marland Kitchen CARVEDILOL 3.125 MG PO TABS Oral Take 3.125 mg by mouth 2 (two) times daily with a meal.    . VITAMIN D3 1000 UNITS PO CAPS Oral Take 1 capsule by mouth daily.    . FUROSEMIDE 40 MG PO TABS Oral Take 40 mg by mouth daily. For swelling    . GUAIFENESIN ER 600 MG PO TB12 Oral  Take 600 mg by mouth 2 (two) times daily.     Carma Leaven M PLUS PO TABS Oral Take 1 tablet by mouth daily.    Marland Kitchen OMEPRAZOLE 20 MG PO CPDR Oral Take 20 mg by mouth daily.    Marland Kitchen POLYETHYLENE GLYCOL 3350 PO PACK Oral Take 17 g by mouth daily as needed. For constipation    . PRESCRIPTION MEDICATION Both Eyes Place 1 drop into both eyes daily. Eye drops (samples were given to patient by Dr. Dione Booze)    . SENNA-DOCUSATE SODIUM 8.6-50 MG PO TABS Oral Take 1 tablet by mouth daily as needed. For constipation    . SIMETHICONE 125 MG PO CHEW Oral Chew 125-250 mg by mouth 2 (two) times daily after a meal.    . TRAMADOL HCL 50 MG PO TABS Oral Take 50 mg by mouth every 6 (six) hours as needed. As needed for pain    . PREDNISONE 20 MG PO TABS Oral Take 2 tablets (40 mg total) by mouth daily. 4 tablet 0    BP 139/62  Pulse 72   Temp 98 F (36.7 C) (Oral)  Resp 20  SpO2 100%  Physical Exam  Nursing note and vitals reviewed. Constitutional: She is oriented to person, place, and time. She appears well-developed and well-nourished.  HENT:  Head: Normocephalic and atraumatic.       Tongue appears normal size.  Eyes: EOM are normal. Pupils are equal, round, and reactive to light.  Neck: Normal range of motion. Neck supple.  Cardiovascular: Normal rate, regular rhythm and normal heart sounds.   No murmur heard. Pulmonary/Chest: Effort normal. No respiratory distress. She has no wheezes. She has no rales.       Mildly harsh breath sounds.  Abdominal: Soft. Bowel sounds are normal. She exhibits no distension. There is no tenderness. There is no rebound and no guarding.  Musculoskeletal:       Right lower leg with pitting edema greater than left lower leg.  Neurological: She is alert and oriented to person, place, and time. No cranial nerve deficit.  Skin: Skin is warm and dry.  Psychiatric: She has a normal mood and affect. Her speech is normal.    ED Course  Procedures (including critical care time)  Labs Reviewed  COMPREHENSIVE METABOLIC PANEL - Abnormal; Notable for the following:    Albumin 3.3 (*)     GFR calc non Af Amer 47 (*)     GFR calc Af Amer 55 (*)     All other components within normal limits  CBC WITH DIFFERENTIAL  TROPONIN I   Dg Chest 2 View  06/07/2012  *RADIOLOGY REPORT*  Clinical Data: Severe short of breath with congestion  CHEST - 2 VIEW  Comparison: 05/10/2012  Findings: Heart size is normal.  Aorta is uncoiled.  Negative for infiltrate or effusion.  Negative for heart failure.  Lungs are clear.  IMPRESSION: No active cardiopulmonary disease.  Original Report Authenticated By: Camelia Phenes, M.D.     1. Tongue swelling     Date: 06/07/2012  Rate: 73  Rhythm: normal sinus rhythm  QRS Axis: left  Intervals: normal  ST/T Wave abnormalities: normal  Conduction Disutrbances:right  bundle branch block  Narrative Interpretation:   Old EKG Reviewed: unchanged    MDM  Patient with reported swelling of her tongue. Tongue appears be normal size on examination. Patient states feels little bigger. She also is complaining of swelling in her right leg. Negative Doppler. Lab work  is overall reassuring. She's also complaining of some shortness of breath and cough. Several of these are chronic complaints. Patient be discharged home with some steroids. Over the course of her ER stay the tongue stayed stable. She's not on lisinopril. She'll be discharged home.        Juliet Rude. Rubin Payor, MD 06/07/12 [phone]

## 2012-06-07 NOTE — Telephone Encounter (Signed)
Called, spoke with pt who states her face, eyes, and tongue are swollen "quite a bit."  Reports she does have some DOE but denies SOB at rest and trouble swallowing.  Advised she should go to ER to be evaluated as these sxs are dangerous and difficult to advise over the phone.  Pt states she did not think she needed to call 911 and reported she will have someone take her to the ER.  Advised she should go to the closet ER.  She verbalized understanding.

## 2012-06-07 NOTE — Telephone Encounter (Signed)
Per SN: noted.  Thank you.  Pt is in the ER now.

## 2012-06-07 NOTE — Telephone Encounter (Signed)
I spoke with pt and she c/o right leg swelling from below knee down to her ankles and her face is swelling slightly. She is currently taking lasix 40 mg a day. She is keeping both legs elevated during the day and at night, low salt diet, and is using compression hoses as well w/o relief. She also states she has been "haucking up thick white phlem" not coughing it up. occasional she gets up a lump of blood. She went and saw ENT but states they did nothing for her. Please advise SN thanks  Allergies  Allergen Reactions  . Fentanyl     REACTION: nausea

## 2012-06-16 ENCOUNTER — Other Ambulatory Visit: Payer: Self-pay | Admitting: *Deleted

## 2012-06-16 MED ORDER — AMLODIPINE BESYLATE 2.5 MG PO TABS: 2.5000 mg | ORAL_TABLET | Freq: Every day | ORAL | Status: DC

## 2012-06-17 ENCOUNTER — Other Ambulatory Visit: Payer: Self-pay | Admitting: *Deleted

## 2012-06-17 MED ORDER — CARVEDILOL 3.125 MG PO TABS: 3.1250 mg | ORAL_TABLET | Freq: Two times a day (BID) | ORAL | Status: DC

## 2012-06-21 ENCOUNTER — Encounter: Payer: Self-pay | Admitting: Cardiovascular Disease

## 2012-06-21 ENCOUNTER — Ambulatory Visit (INDEPENDENT_AMBULATORY_CARE_PROVIDER_SITE_OTHER): Payer: Medicare Other | Admitting: Cardiovascular Disease

## 2012-06-21 VITALS — BP 106/61 | HR 76 | Ht 62.0 in | Wt 154.0 lb

## 2012-06-21 DIAGNOSIS — I10 Essential (primary) hypertension: Secondary | ICD-10-CM

## 2012-06-21 DIAGNOSIS — I519 Heart disease, unspecified: Secondary | ICD-10-CM

## 2012-06-21 DIAGNOSIS — R079 Chest pain, unspecified: Secondary | ICD-10-CM

## 2012-06-21 MED ORDER — CARVEDILOL 3.125 MG PO TABS: 3.1250 mg | ORAL_TABLET | Freq: Two times a day (BID) | ORAL | Status: DC

## 2012-06-21 NOTE — Patient Instructions (Signed)
Your physician recommends that you schedule a follow-up appointment in: AS NEEDED  Your physician recommends that you continue on your current medications as directed. Please refer to the Current Medication list given to you today.  

## 2012-06-21 NOTE — Assessment & Plan Note (Signed)
Euvolemic no clinical CHF.  Diastolic in past.  Observe

## 2012-06-21 NOTE — Assessment & Plan Note (Signed)
Well controlled.  Continue current medications and low sodium Dash type diet.    

## 2012-06-21 NOTE — Progress Notes (Signed)
Patient ID: Heather [name], female   DOB: [birth year], 78 y.o.   MRN: [id number] Ms. Fawley is an 78 year old pateint of Dr Teressa Lower last seen 10/11  with a history of congestive heart failure secondary to diastolic dysfunction, hypertension, atypical chest pain with negative Myoviews in 2007 and 2009, breast cancer status post left mastectomy  Chronic phlem and cough with white mucos.  On mucinex but fixated on this issue.  No recent dyspnea or CHF.  Recently seen by Dr Randa Evens GI with no recommendations.     ROS: Denies fever, malais, weight loss, blurry vision, decreased visual acuity, cough, sputum, SOB, hemoptysis, pleuritic pain, palpitaitons, heartburn, abdominal pain, melena, lower extremity edema, claudication, or rash.  All other systems reviewed and negative  General: Affect appropriate Healthy:  appears stated age HEENT: normal Neck supple with no adenopathy JVP normal no bruits no thyromegaly Lungs clear with no wheezing and good diaphragmatic motion Heart:  S1/S2 no murmur, no rub, gallop or click PMI normal Abdomen: benighn, BS positve, no tenderness, no AAA no bruit.  No HSM or HJR Distal pulses intact with no bruits No edema Neuro non-focal Skin warm and dry No muscular weakness   Current Outpatient Prescriptions  Medication Sig Dispense Refill  . ALPRAZolam (XANAX) 0.5 MG tablet Take 0.5 mg by mouth 3 (three) times daily as needed. For anxiety      . amLODipine (NORVASC) 2.5 MG tablet Take 1 tablet (2.5 mg total) by mouth daily.  30 tablet  3  . aspirin EC 81 MG tablet Take 81 mg by mouth daily.      . carvedilol (COREG) 3.125 MG tablet Take 1 tablet (3.125 mg total) by mouth 2 (two) times daily with a meal.  60 tablet  0  . Cholecalciferol (VITAMIN D3) 1000 UNITS CAPS Take 1 capsule by mouth daily.      . furosemide (LASIX) 40 MG tablet Take 40 mg by mouth daily. For swelling      . guaiFENesin (MUCINEX) 600 MG 12 hr tablet Take 600 mg by mouth 2 (two) times daily.        . Multiple Vitamins-Minerals (MULTIVITAMINS THER. W/MINERALS) TABS Take 1 tablet by mouth daily.      Marland Kitchen omeprazole (PRILOSEC) 20 MG capsule Take 20 mg by mouth daily.      . polyethylene glycol (MIRALAX / GLYCOLAX) packet Take 17 g by mouth daily as needed. For constipation      . PRESCRIPTION MEDICATION Place 1 drop into both eyes daily. Eye drops (samples were given to patient by Dr. Dione Booze)      . sennosides-docusate sodium (SENOKOT-S) 8.6-50 MG tablet Take 1 tablet by mouth daily as needed. For constipation      . traMADol (ULTRAM) 50 MG tablet Take 50 mg by mouth every 6 (six) hours as needed. As needed for pain        Allergies  Fentanyl  Electrocardiogram:  Assessment and Plan

## 2012-06-21 NOTE — Assessment & Plan Note (Signed)
Atypical nonrecurrent normal myovue in past No need for further w/u

## 2012-06-28 ENCOUNTER — Ambulatory Visit (INDEPENDENT_AMBULATORY_CARE_PROVIDER_SITE_OTHER): Payer: Medicare Other | Admitting: Pulmonary Disease

## 2012-06-28 ENCOUNTER — Encounter: Payer: Self-pay | Admitting: Pulmonary Disease

## 2012-06-28 VITALS — BP 112/58 | HR 83 | Temp 96.9°F | Ht 64.0 in | Wt 153.2 lb

## 2012-06-28 DIAGNOSIS — R6889 Other general symptoms and signs: Secondary | ICD-10-CM | POA: Insufficient documentation

## 2012-06-28 DIAGNOSIS — M545 Low back pain, unspecified: Secondary | ICD-10-CM

## 2012-06-28 DIAGNOSIS — K5900 Constipation, unspecified: Secondary | ICD-10-CM

## 2012-06-28 DIAGNOSIS — C50919 Malignant neoplasm of unspecified site of unspecified female breast: Secondary | ICD-10-CM

## 2012-06-28 DIAGNOSIS — K219 Gastro-esophageal reflux disease without esophagitis: Secondary | ICD-10-CM

## 2012-06-28 DIAGNOSIS — K5730 Diverticulosis of large intestine without perforation or abscess without bleeding: Secondary | ICD-10-CM

## 2012-06-28 DIAGNOSIS — F411 Generalized anxiety disorder: Secondary | ICD-10-CM

## 2012-06-28 DIAGNOSIS — M47812 Spondylosis without myelopathy or radiculopathy, cervical region: Secondary | ICD-10-CM

## 2012-06-28 DIAGNOSIS — I10 Essential (primary) hypertension: Secondary | ICD-10-CM

## 2012-06-28 DIAGNOSIS — M1990 Unspecified osteoarthritis, unspecified site: Secondary | ICD-10-CM

## 2012-06-28 DIAGNOSIS — I872 Venous insufficiency (chronic) (peripheral): Secondary | ICD-10-CM

## 2012-06-28 DIAGNOSIS — K449 Diaphragmatic hernia without obstruction or gangrene: Secondary | ICD-10-CM

## 2012-06-28 MED ORDER — HYDROCODONE-ACETAMINOPHEN 5-325 MG PO TABS: ORAL_TABLET | ORAL | Status: DC

## 2012-06-28 NOTE — Progress Notes (Signed)
Subjective:    Patient ID: Heather [name], female    DOB: 1917-11-20, 78 y.o.   MRN: [id number]  HPI 78 y/o BF here for a follow up visit... she has multiple medical problems including:  Dyspnea & Anxiety;  HBP;  Hx CP followed by DrBensimhon;  HH/ Gastritis;  Divertics/ Constip;  Functional Abd Pain;  Renal cyst followed by DrKimbrough;  Hx left breast [phone];  DJD/ CSpine spondy/ LBP/ CTS; and Anxiety...  ~  December 26, 2010:  add-on appt due to right side pain x4d she says> "I feel fine, BUT..." states the right post CWP is unbearable, sharp, worse w/ movement, & denies trauma... area over the right 11-12th ribs is tender to palp & reproduces her discomfort;  chest is clear & no rubs w/ O2 sat 98% RA;  last CXR 9/11 clear, some scarring, ectatic Ao, DJD sp;  she wants to relate this all to her prev complaints of Abd pain & swelling, left flank pain, etc (recall extensive evals w/ nothing signif found to explain her varied symptoms)... ** we discussed rx w/ rest, heat, rib binder, Lidoderm patches, Vicodin Prn.  ~  February 10, 2011:  6wk ROV- she saw TP 3/16 w/ cough & mult somatic complaints trouble swallowing, throat closing, drainage, nasal congestion, "harking phlegm" etc;  CXR= stable, ectatic Ao, NAD;  Labs include BMet=wnl, BNP=110, UA=clear;  Rx w/ Zyrtek, Saline, Nasonex, Pepcid, Mucinex, & keep appt w/ DrStark for swallowing eval... Now c/o same constellation of symptoms more or less, never took the Zyrtek or Nasonex (too$$) & we discussed regimen w/ Zyrtek AM, Saline/ Mucinex during the day/ Flonase Qhs...    Dyspnea>  On Mucinex plus her anxiolytics (Alprazolam) but not taking regularly...    HBP>  On Coreg, Norvasc, Cozaar, Lasix & BP= 110/64 & well controlled, she thinks too many meds...    GI>  She has hx HH, gastritis, divertics, constip, & functional abd pain w/ mult extensive evaluations in the past;  On Omep20, Miralax, Senakot-S but she persists w/ c/o swelling & "something  wrong in my stomach it's getting bigger & bigger- I might have cancer"; last CT Abd 1/11 reviewed- divertics, otherw neg;  States her appetite is OK but weight down 12#;  C/o left side of abd hurts when she gets up to walk, no pain sitting or supine;  Offered additional meds (try Levsin or bentyl but she declines);  She has up coming appt w/ DrStark.    DJD>  Followed by DrWhitfield & she's had epid shots by DrNewton in the past    Anxiety>  On Alprazolam but she won't take it regularly for help w/ her severe anxiety & cancer phobia...  ~  June 12, 2011:  38mo ROV & she persists w/ mult somatic complaints & flight of ideas hx, "I have really suffered" she says...    GI> Hx HH, gastritis, divertics, & vague abd pains> she is not happy w/ DrStark's eval & sought second opinion (DrMann declined to see her); she saw DrJEdwards & his notes are reviewed;  Today she is quite distraught about papers to fill out for the GI dept at [facility]- DrNThorne...    Ortho> c/o pain in left leg w/ some edema (she has VI & dependent edema) & states her legs feel like lead; exam shows some incr in LE edema & we discussed incr in her Lasix from 20mg  to 40mg /d plus a 2gm sodium diet...    We rechecked her  labs today> all WNL.Marland Kitchen.  ~  July 09, 2011:  29mo ROV & add-on due to sm area of dermatitis on her left leg, sl excoriated & sm scab==> her son is here today & told me she drove him crazy w/ her ruminations about this being a cancer & that we would send her to the cancer center, etc (she got angry at him for telling me); we discussed dermatitis & rec RX w/ LIDEX-E cream to apply Bid & rec Derm review if not resolved...    She is also quite concerned about a "bump" on the back of her head==> occipital bone & it's symmetric, she is reassured...    She has been to [facility] GI Dept DrThorne & note is pending but she has lab sheet (all wnl) & is sch for EGD 9/12; they told her to stop Metamucil, and take Miralax, Senakot; she is most concerned/  upset by an evaluation form she received asking questions like how long she waited for this or that during her visit & she doesn't know what to do or how to complete the info! I encouraged her to take her Alpraz regularly Tid...  ~  September 22, 2011:  2.50mo ROV & she states "terrible" c/o phlegm, swelling, feeling bad, wt loss, "this throat business- they found white balls"; very anxious w/ mult somatic complaints >>    HBP> on Norvasc2.5, Coreg3.125Bid, Losartan50, Lasix20-40/d; she went to the ER w/ ?angioedema of the tongue, seen by [facility] who called me & we decided to stop her Cozaar; BP today off Cozaar & on the other 3 meds showed 122/72> rec to continue the same...    GI> she saw DrThorne at [facility] 9/12 for a comprehensive GI eval> note reviewed- they did an EGD 08/06/11 w/ mult white nodular plaques seen in cardia & fundus- Bx'd & pending (she is very concerned);  she went to the ER 08/16/11 w/ abd discomfort- the same vague left side abd discomfort that she has seen mult physicians about- Exam showed mild diffuse tenderness;  they did CT Abd showing divertics, no inflamm, right renal & left hep lobe cysts, atherosclerotic changes, NAD;  Labs & urine were essentially WNL;  EKG showed NSR, RBBB & LAD, no acute changes...  she saw DrEdwards [facility]) 10/12 w/ mult complaints including "a knot on her skull & she feels that her brains are coming out"; he thought she was doing reasonably well from the GI standpoint w/ her dysphagia, vague abd pain, & constip...    Ortho> she saw DrWhitfield w/ MRI Lumbar spine 10/12 showing degenerative changes w/ mild progression since 7/10 MRI, mild-mod sp stenosis & lat recess stenosis at several levels; she has Vicodin to take up to 3/d as needed for pain; she is anxiously awaiting f/u visit w/ DrWhitfield...    Derm> DrLupton saw pt 07/23/11 w/ removal of sm squamous cell ca (in situ) from left lower leg  ~  December 29, 2011:  59mo ROV & Heather [name] has her usual  complement of somatic complaints> all seem stable w/o appreciable change; she does not want to see DrJEdwards again, but is happy w/ eval & communication from DrThorne at [facility] says her abd pain is coming from the arthritis in her back, & she has seen DrWhitfield for that... She lists mult OTC GI meds including> Prilosec, Miralax, Metamucil, Senakot, Mylicon...    BP controlled on her Coreg, Amlodipine, Lasix; BP= 120/68 today & denies CP/ palpit, ch in DOE, edema, etc..Marland Kitchen  She saw DrThorne recently for f/u & note pending; prev note 11/12 reviewed> she has done a thorough investigation...    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  ~  Mar 29, 2012:  5mo ROV & she continues w/ mult complaints- ran out of Dymista sample for nasal symptoms (didn't help), thinks her BP is too low, c/o swelling (has VI- reminded to elim sodium, wear support hose, elevate, take Lasix40), "constip" but stool is loose & soft (rec- Align), saw DrEdwards for GI- she denies dysphagia, still c/o chr abd discomfort, etc; c/o "dark urine" it was clear and C&S was neg; she requires constant reassurance about her condition & that she does not have cancer; asked to take her Xanax more regularly...  We reviewed prob list, meds, xrays and labs>  See below>>   ~  June 28, 2012:  5mo ROV & Heather [name] continues to have mult somatic complaints & says "terrible, terrible, terrible" states she's lost all the muscles in her abd & now uses a girdle; "all my doctors say for me to see you" eg. DrEdwards, Walker Kehr, etc; she still believes that she has cancer "somewhere- just like my friend"... Then she launches into a flight of ideas of complaints> carpel tunnel symptoms (she wants to f/u w/ DrWhtfield), cough/ phlegm, dizzy, "it's my stomach", etc...    We reviewed prob list, meds, xrays and labs> see below for updates  >>           Problem List:  DYSPNEA (ICD-786.9) - eval 5/11 c/o SOB- can't get a DB, can't get enough air in, etc... occurs at rest & w/ exerc "I'm very active despite my arthritis", talks rapidly in long sentences w/o apparent distress... no cough, phlegm, hemoptysis, etc- but as usual she is quite distressed by these symptoms and wants further eval> we discussed re-assessment w/ CXR (clear, NAD);  PFT (totally norm airflow);  Labs (all WNL);  Rx w/ ALPRAZOLAM 0.5mg  Tid & encouraged to take regularly. ~  CXR 4/13 showed norm heart size, clear lungs, mild TSpine spondylosis... ~  She continues to complain of drainage, throat symptoms, etc; Rx w/ Mucinex, OTC antihist, Nasonex, etc;  Seen by ENT, DrRosen w/ ?xerostomia & rec incr water etc.  HYPERTENSION (ICD-401.9) - on AMLODIPINE 2.5mg /d,  COREG 3.125mg Bid,  LASIX 40mg - taking 1/2tab/d because 1 tab makes her too weak...  ~  7/12:  C/o incr edema in legs & advised to elim sodium from diet & incr her LASIX to 40mg /d... ~  10/12:  Prev Cozaar 50mg  was stopped 10/12 via the ER when she presented w/ ?angioedema of the tongue... ~  2/13:  BP= 120/68 & doing well on 3 med regimen & off Losartan. ~  5/13:  BP= 110/60 & she has mult somatic complaints but denies HA, CP, palpit, ch in SOB or edema... ~  8/13:  BP= 112/58 & there is no change in her mult somatic complaints...  Hx of CHEST PAIN (ICD-786.50) - she takes ASA 81mg /d + Tylenol Prn... followed by DrBensimhon for Cards. ~  Baseline EKG w/ RBBB & LAD, no acute changes... ~  NuclearStressTest done 2007 & 2009 showed normal- without scar or ischemia, EF=67%...   ~  2DEcho 7/08 showed mild AI, mild diastolic dysfunction, EF=55-60%. ~  CT Abd 1/09 & 9/12 showed incidental coronary calcif, & aorto-iliac atherosclerotic changes  as well... ~  8/13: she saw DrNishan> HBP, DD, atypCP w/ neg Myoviews & no changes made...  VENOUS INSUFFICIENCY (ICD-459.81) - she has mod VI and follows a low sodium diet,  elevates legs when able, and wears support hose. ~  7/12:  C/o incr edema & we reviewed a 2gm sodium diet & increased her Lasix from 20mg /d to 40mg /d... ~  5/13:  She has persist complaints but mild chr VI w/ min edema & reminded to elim sodium, elev, support hose, take the Lasix...  HIATAL HERNIA (ICD-553.3) & GASTRITIS (ICD-535.50) - on OMEPRAZOLE 20mg /d & followed by DrJEdwards for GI having fired DrStark & [facility] in past. ~  last EGD was 2/08 w/ 3cm HH & gastitis... ~  CT Abd 1/09 revealed calcif gallstones, & mild ectasia of AA... ~  CT Abd 1/11 showed left colon divertics, tiny hepatic lesions w/o change & right renal cyst stable. ~  CT Abd 9/12 showed divertics (no inflamm), right renal & left hep lobe cysts, atherosclerotic changes, NAD... ~  Seen by DrEdwards 4/13> c/o dysphagia & congestion; he notes mult GI complaints, known esoph dysmotility w/o stricture, hard to pin down her complaints...  DIVERTICULOSIS, COLON (ICD-562.10) - followed regularly by DrStark. ~  colonoscopy 7/03 by DrStark showing divertics only... we discussed Miralax + Senakot-S for constipation. ~  colonoscopy 2/11 showed divertics, 3mm polyp, hems...  ? of ABDOMINAL PAIN & SYMPTOMATOLOGY - she had a very thorough work up in 2009> she has a cancer phobia and is very difficult to reassure her that everything appropriate has been done & no cancer found... Mult additional GI evals since then from [facility], [facility] (DrEdwards), [facility] (DrThorne)... ~  eval by GI- DrStark 12/08 but pt wasn't satisfied w/ his examination... ~  full labs 1/09 were WNL, sed=20... ~  CT Abd/ Pelvis 1/09= no acute abn in abd (calcif gallstones & mild ectasia of AA), calcif fibroid in the pelvis, & ?regarding the left ovary. ~  referral to DrFontaine, GYN> his notes are reviewed... ~  follow up w/ DrKimbrough, Urology> his notes are reviewed... ~  eval DrWhitfield for Orthopedics- rec shots in back by [facility] w/ some improvement. ~  repeat  GI eval 1/11 by DrStark w/ CT & colonoscpy as above- no acute problems. ~  5/11 & 7/11 >> persist complaints w/ neg recheck by DrStark & DrKimbrough... ~  12/11:  routine GYN surveillance DrFontaine found anteverted uterus w/ fibroids, atrophic ovaries bilat, no mass. ~  Repeat GI eval 4/12 by DrStark> he tried to reassure the pt, felt some symptoms might be from poor abd wall muscle tone, rec continue Omep, laxatives... ~  6/12:  She sought another GI opinion DrEdwards> he did not feel she needed any additional testing... ~  She tells me she has yet another appt to see a gastroenterologist at [facility]==> seen by DrThorne 8/12 & note reviewed... ~  EGD by DrThorne 9/12 w/ mult whitish nodular plaques in cardia & fundus> Bx results pending & she is very concerned (remember her hx cancer phobia). ~  She continues to f/u w/ DrThorne at [facility] & is happy w/ her eval...  RENAL CYST (ICD-593.2) - she has a 1 cm right renal cyst seen on CT in 2007 and followed by DrKimbrough... f/u CT Abd 1/09 - no change in the cyst... f/u renal ultrasound 1/10 w/ small bilat cysts... f/u CT Abd 1/11- no change in cyst. ~  she saw DrKimbrough 7/11 for LBP, microhematuria, urge  incontinence- stable, no additional Rx. ~  she saw DrKimbrough 1/12 for urge incontinence, freq, nocturia> rec Desmopressin 0.2mg - 1/2 tab Qhs.  CARCINOMA, BREAST, LEFT (ICD-174.9) - Surgery 9/94 by DrBlievernicht w/ Left modified radical mastectomy... +estrogen receptors and Rx w/ Tamoxifen... f/u mammograms all neg... breast exam on right has been neg- no nodules palpated...  DEGENERATIVE JOINT DISEASE (ICD-715.90) - on OTC meds + VICODIN Prn... she has end-stage OA of Right Knee per DrWhitfield treated w/ shots in the past... she has a knee brace and a cane for ambulation... ~  8/13:  She wants a better pain pill 7 we discussed change to [facility] 5/325 Tid prn...  Hx of CERVICAL SPONDYLOSIS WITHOUT MYELOPATHY (ICD-721.0) - Xray of CSpine in 2006 showed  multilevel CSpine spondylosis... Rx w/ rest, heat, Percocet, and f/u w/ Ortho...  Hx of LOW BACK PAIN, CHRONIC (ICD-724.2) - eval DrWhitfield w/ rec for shots by DrNewton & she's feeling sl better & wears back brace... ~  5/10: mult somatic complaints- primarily c/o left side/ postero-lat rib pain...  Exam w/ tender left lat/ post ribs... Bone Scan= neg... ~  MRI Lumbar spine 7/10 by DrWhitfield... ~  Epidural steroid shot by DrNewton 8/10 & 1/11 ~  Repeat LBP eval by DrWhitfield w/ another MRI lumbar spine showing degenerative changes w/ mild progression since 7/10 MRI, mild-mod sp stenosis & lat recess stenosis at several levels ~  DrWhitfield referred her to [facility] to consider more shots but she is not so inclined... ~  6/13: she saw DrTooke w/ lumbar spondylosis; he felt that he had nothing to offer her & rec incr Tramadol to Qid...  CARPAL TUNNEL SYNDROME, BILATERAL (ICD-354.0) - she had prev right carpal tunnel release by DrWhitfield and was c/o increasing discomfort in her left wrist despite wrist splint Qhs- had left carpal tunnel release 1/10 by DrWhitfield...  ANXIETY (ICD-300.00) - on ALPRAZOLAM 0.5mg  Tid & encouraged to take it regularly... she has a cancer phobia & it is very difficult to reassure her regarding her symptoms and our evaluations... ~  7/13: she went to the ER c/o swelling of the tongue but eval by EDP indicated that her tongue was normal & not swollen, she was not on an ACE, given Pred Rx anyway.   Past Surgical History  Procedure Date  . Left mastectomy   . Carpal tunnel release     Outpatient Encounter Prescriptions as of 06/28/2012  Medication Sig Dispense Refill  . ALPRAZolam (XANAX) 0.5 MG tablet Take 0.5 mg by mouth 3 (three) times daily as needed. For anxiety      . amLODipine (NORVASC) 2.5 MG tablet Take 1 tablet (2.5 mg total) by mouth daily.  30 tablet  3  . aspirin EC 81 MG tablet Take 81 mg by mouth daily.      . carvedilol (COREG) 3.125 MG tablet  Take 1 tablet (3.125 mg total) by mouth 2 (two) times daily with a meal.  60 tablet  11  . Cholecalciferol (VITAMIN D3) 1000 UNITS CAPS Take 1 capsule by mouth daily.      . furosemide (LASIX) 40 MG tablet Take 40 mg by mouth daily. For swelling      . guaiFENesin (MUCINEX) 600 MG 12 hr tablet Take 600 mg by mouth 2 (two) times daily.       . Multiple Vitamins-Minerals (MULTIVITAMINS THER. W/MINERALS) TABS Take 1 tablet by mouth daily.      Marland Kitchen omeprazole (PRILOSEC) 20 MG capsule Take 20 mg by mouth  daily.      . polyethylene glycol (MIRALAX / GLYCOLAX) packet Take 17 g by mouth daily as needed. For constipation      . PRESCRIPTION MEDICATION Place 1 drop into both eyes daily. Eye drops (samples were given to patient by Dr. Dione Booze)      . sennosides-docusate sodium (SENOKOT-S) 8.6-50 MG tablet Take 1 tablet by mouth daily as needed. For constipation      . traMADol (ULTRAM) 50 MG tablet Take 50 mg by mouth every 6 (six) hours as needed. As needed for pain        Allergies  Allergen Reactions  . Fentanyl     REACTION: nausea    Current Medications, Allergies, Past Medical History, Past Surgical History, Family History, and Social History were reviewed in [employer] record.    Review of Systems      See HPI - all other systems neg except as noted... She has mult somatic complaints and anxiety. The patient complains of decreased hearing, chest discomfort, dyspnea on exertion, abdominal pain, incontinence, muscle weakness, and difficulty walking.  The patient denies anorexia, fever, vision loss, hoarseness, syncope, peripheral edema, prolonged cough, headaches, hemoptysis, melena, hematochezia, severe indigestion/heartburn, hematuria, transient blindness, depression, abnormal bleeding, enlarged lymph nodes, and angioedema.     Objective:   Physical Exam   WD, WN, 78 y/o BF in NAD... she is very anxious... GENERAL:  Alert & oriented; pleasant & cooperative... HEENT:   [place]/AT, EOM-wnl, PERRLA, EACs-clear, TMs-wnl, NOSE-clear, THROAT-clear & wnl, no lesions seen. NECK:  Neck ROM decreased, no JVD; normal carotid impulses w/o bruits; no thyromegaly or nodules palpated; no lymphadenopathy. CHEST:  Clear to P & A; without wheezes/ rales/ or rhonchi heard... + tender right lat 11th & 12th ribs on palp. BREAST:  s/p left mastectomy... right breast normal- without nodules palpated... HEART:  Regular Rhythm; without murmurs/ rubs/ or gallops detected... ABDOMEN:  Soft & nontender; normal bowel sounds; no organomegaly or masses palpated... EXT: without deformities, mod arthritic changes; no varicose veins/ +venous insuffic/ tr edema. NEURO:  CN's intact; no focal neuro deficits... DERM:  Small sl excoriated area of dermatitis left shin- no drainage,no rash, etc...  RADIOLOGY DATA:  Reviewed in the EPIC EMR & discussed w/ the patient...  LABORATORY DATA:  Reviewed in the EPIC EMR & discussed w/ the patient...   Assessment & Plan:    Dyspnea>  She had a neg pulm eval 2011 as above; pt very anxious & Alprazolam seems to help when she takes it...  HBP>  Controlled on low doses of several meds including Coreg, Norvasc & Lasix;  She is encouraged to take meds regularly & maintain regular follow up visits...  Hx CP> evaluated for Cards by DrBensimon & he has not been in favor of invasive testing; stable on conservative med rx...  VI/ Edema>  She is concerned about incr edema & we reviewed 2gm sodium diet & rec keep Lasix at 40mg /d...  GI> HH, Gastritis, Divertics, vague abd pain>  I tried once again to reassure her about the thorough GI evaluations that her has undergone; she is happy w/ DrThorne's eval & will f/u w/ her & in the meanwhile continue rx w/ Prilosec, Miralax, Metamucil, Senakot, Mylicon, etc...  Renal cyst>  This is benign & has been followed by DrKimbrough...  Hx left Breast Cancer>  No known recurrence...  DJD> Cervical spondylosis & chronic LBP>   She has been evaluated by DrWhitfield for Ortho & given epid steroid  shots by [employer]... She wants a better pain pill & we'll try NORCO 5/325 tid as needed...  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  Anxiety>  Asked once again to increase the Alprazolam to 1/2 - 1 tab TID regularly...   Patient's Medications  New Prescriptions   HYDROCODONE-ACETAMINOPHEN (NORCO) 5-325 MG PER TABLET    Take 1/2 to 1 tablet by mouth three times daily as needed for pain  Previous Medications   ALPRAZOLAM (XANAX) 0.5 MG TABLET    Take 0.5 mg by mouth 3 (three) times daily as needed. For anxiety   AMLODIPINE (NORVASC) 2.5 MG TABLET    Take 1 tablet (2.5 mg total) by mouth daily.   ASPIRIN EC 81 MG TABLET    Take 81 mg by mouth daily.   CARVEDILOL (COREG) 3.125 MG TABLET    Take 1 tablet (3.125 mg total) by mouth 2 (two) times daily with a meal.   CHOLECALCIFEROL (VITAMIN D3) 1000 UNITS CAPS    Take 1 capsule by mouth daily.   FUROSEMIDE (LASIX) 40 MG TABLET    Take 40 mg by mouth daily. For swelling   GUAIFENESIN (MUCINEX) 600 MG 12 HR TABLET    Take 600 mg by mouth 2 (two) times daily.    MULTIPLE VITAMINS-MINERALS (MULTIVITAMINS THER. W/MINERALS) TABS    Take 1 tablet by mouth daily.   OMEPRAZOLE (PRILOSEC) 20 MG CAPSULE    Take 20 mg by mouth daily.   POLYETHYLENE GLYCOL (MIRALAX / GLYCOLAX) PACKET    Take 17 g by mouth daily as needed. For constipation   PRESCRIPTION MEDICATION    Place 1 drop into both eyes daily. Eye drops (samples were given to patient by Dr. Dione Booze)   SENNOSIDES-DOCUSATE SODIUM (SENOKOT-S) 8.6-50 MG TABLET    Take 1 tablet by mouth daily as needed. For constipation   TRAMADOL (ULTRAM) 50 MG TABLET    Take 50 mg by mouth every 6 (six) hours as needed. As needed for pain  Modified Medications   No medications on file  Discontinued Medications   No medications on file

## 2012-06-28 NOTE — Patient Instructions (Addendum)
Today we updated your med list in our EPIC system...    Continue your current medications the same...  We decided to add a better pain pill for you to use as needed> NORCO (Hydrocodone/ Acetaminophen)    Take 1/2 to 1 tab up to 3 times daily as needed...  Call for any questions...  Let's continue our 3 month follow up visits.Marland KitchenMarland Kitchen

## 2012-07-09 ENCOUNTER — Telehealth: Payer: Self-pay | Admitting: Pulmonary Disease

## 2012-07-09 NOTE — Telephone Encounter (Signed)
Called and spoke with pt and she stated that the norco is making her feel worse.  She stated that her leg is still swollen and the norco makes her feel like she can hardly walk, pain all over.  Wanted SN recs on this.  SN please advise. Thanks  Allergies  Allergen Reactions  . Fentanyl     REACTION: nausea

## 2012-07-09 NOTE — Telephone Encounter (Signed)
Per SN---the norco was supposed to help with the pain not make it worse----stop the norco and use the tramadol as directed along with extra strength tylenol is needed.  lmomtcb to make pt aware.

## 2012-07-12 NOTE — Telephone Encounter (Signed)
Pt is aware. Jennifer Castillo, CMA  

## 2012-07-13 ENCOUNTER — Telehealth: Payer: Self-pay | Admitting: Pulmonary Disease

## 2012-07-13 NOTE — Telephone Encounter (Signed)
Pt returned call and is requesting a prescription for 2 mastectomy bras.  Her current bras are "worn out."  Would like this mailed to her (home address verified).  This has been hand-written on a prescription pad for SN to sign.  Will make copy for pt's chart once this is done.

## 2012-07-13 NOTE — Telephone Encounter (Signed)
LMOMTCB x 1 

## 2012-07-14 NOTE — Telephone Encounter (Signed)
rx has been stamped for the pt and placed in the mail to her home per pts request.  Copy of the rx has been made and placed in the scan folder.  Nothing further is needed.

## 2012-07-29 ENCOUNTER — Telehealth: Payer: Self-pay | Admitting: Pulmonary Disease

## 2012-07-29 DIAGNOSIS — R05 Cough: Secondary | ICD-10-CM

## 2012-07-29 DIAGNOSIS — R059 Cough, unspecified: Secondary | ICD-10-CM

## 2012-07-29 MED ORDER — AZITHROMYCIN 250 MG PO TABS: ORAL_TABLET | ORAL | Status: DC

## 2012-07-29 NOTE — Telephone Encounter (Signed)
I spoke with pt she states she is "hocking" up thick, foamy, lumpy white phlem-sometimes looks like "meat chopped up in it". Stated she does not cough it up only "hocks" this up. She stated she saw ENT and per pt seems like this is coming from her lungs and needed to see SN. Pt stated "something is wrong with my lungs". Refused to see TP. Please advise Dr. Kriste Basque Thanks

## 2012-07-29 NOTE — Telephone Encounter (Signed)
/  I spoke with pt and is aware of SN recs. She is scheduled to come in 9 at 2:30. Aware to arrive 10-15 minutes early to have CXR done prior to appt. I have also sent in zpak rx as well. Nothing further was needed

## 2012-07-29 NOTE — Telephone Encounter (Signed)
Per SN---take zpak #1  Take as directed, mucinex otc 2 po bid with plenty of fluids and will need to do this first and then follow up appt with SN with cxr 1st and spirometry next week can use 9-17 in the afternoon.  thanks

## 2012-08-03 ENCOUNTER — Encounter: Payer: Self-pay | Admitting: Pulmonary Disease

## 2012-08-03 ENCOUNTER — Ambulatory Visit (INDEPENDENT_AMBULATORY_CARE_PROVIDER_SITE_OTHER)
Admission: RE | Admit: 2012-08-03 | Discharge: 2012-08-03 | Disposition: A | Discharge status: Home / Self Care | Payer: Medicare Other | Source: Ambulatory Visit | Attending: Pulmonary Disease | Admitting: Pulmonary Disease

## 2012-08-03 ENCOUNTER — Ambulatory Visit (INDEPENDENT_AMBULATORY_CARE_PROVIDER_SITE_OTHER): Payer: Medicare Other | Admitting: Pulmonary Disease

## 2012-08-03 VITALS — BP 96/58 | HR 66 | Temp 97.3°F | Ht 64.0 in | Wt 147.6 lb

## 2012-08-03 DIAGNOSIS — M545 Low back pain, unspecified: Secondary | ICD-10-CM

## 2012-08-03 DIAGNOSIS — I519 Heart disease, unspecified: Secondary | ICD-10-CM

## 2012-08-03 DIAGNOSIS — K449 Diaphragmatic hernia without obstruction or gangrene: Secondary | ICD-10-CM

## 2012-08-03 DIAGNOSIS — K5900 Constipation, unspecified: Secondary | ICD-10-CM

## 2012-08-03 DIAGNOSIS — F411 Generalized anxiety disorder: Secondary | ICD-10-CM

## 2012-08-03 DIAGNOSIS — R0602 Shortness of breath: Secondary | ICD-10-CM

## 2012-08-03 DIAGNOSIS — I872 Venous insufficiency (chronic) (peripheral): Secondary | ICD-10-CM

## 2012-08-03 DIAGNOSIS — R059 Cough, unspecified: Secondary | ICD-10-CM

## 2012-08-03 DIAGNOSIS — K219 Gastro-esophageal reflux disease without esophagitis: Secondary | ICD-10-CM

## 2012-08-03 DIAGNOSIS — R05 Cough: Secondary | ICD-10-CM

## 2012-08-03 DIAGNOSIS — I10 Essential (primary) hypertension: Secondary | ICD-10-CM

## 2012-08-03 DIAGNOSIS — C50919 Malignant neoplasm of unspecified site of unspecified female breast: Secondary | ICD-10-CM

## 2012-08-03 DIAGNOSIS — K5730 Diverticulosis of large intestine without perforation or abscess without bleeding: Secondary | ICD-10-CM

## 2012-08-03 MED ORDER — MOMETASONE FURO-FORMOTEROL FUM 100-5 MCG/ACT IN AERO: 1.0000 | INHALATION_SPRAY | Freq: Two times a day (BID) | RESPIRATORY_TRACT | Status: DC

## 2012-08-03 MED ORDER — HYDROCODONE-HOMATROPINE 5-1.5 MG/5ML PO SYRP: ORAL_SOLUTION | ORAL | Status: DC

## 2012-08-03 MED ORDER — OMEPRAZOLE 20 MG PO CPDR: 40.0000 mg | DELAYED_RELEASE_CAPSULE | Freq: Every day | ORAL | Status: DC

## 2012-08-03 NOTE — Patient Instructions (Addendum)
Today we updated your med list in our EPIC system...    Continue your current medications the same...  We refilled the OMEPRAZOLE 40mg - take one tab daily, 30 min before the 1st meal of the day...  For your HOCKING:    Start the OTC MUCINEX 600mg - 2 tabs twice daily w/ lots of water...    Start the [facility] syrup- one tsp 4 times daily...    Start the new inhaler> DULERA 100/5- one inhalation twice daily, and rinse out w/ Listerine after each treatment...  Let's plan a recheck in one month to see if your regimen needs adjusting.Marland KitchenMarland Kitchen

## 2012-08-03 NOTE — Progress Notes (Signed)
Subjective:    Patient ID: Heather [name], female    DOB: 09-02-1918, 78 y.o.   MRN: [id number]  HPI 78 y/o BF here for a follow up visit... she has multiple medical problems including:  Dyspnea & Anxiety;  HBP;  Hx CP followed by DrBensimhon;  HH/ Gastritis;  Divertics/ Constip;  Functional Abd Pain;  Renal cyst followed by DrKimbrough;  Hx left breast [phone];  DJD/ CSpine spondy/ LBP/ CTS; and Anxiety...  ~  December 26, 2010:  add-on appt due to right side pain x4d she says> "I feel fine, BUT..." states the right post CWP is unbearable, sharp, worse w/ movement, & denies trauma... area over the right 11-12th ribs is tender to palp & reproduces her discomfort;  chest is clear & no rubs w/ O2 sat 98% RA;  last CXR 9/11 clear, some scarring, ectatic Ao, DJD sp;  she wants to relate this all to her prev complaints of Abd pain & swelling, left flank pain, etc (recall extensive evals w/ nothing signif found to explain her varied symptoms)...  we discussed rx w/ rest, heat, rib binder, Lidoderm patches, Vicodin Prn.  ~  February 10, 2011:  6wk ROV- she saw TP 3/16 w/ cough & mult somatic complaints trouble swallowing, throat closing, drainage, nasal congestion, "harking phlegm" etc;  CXR= stable, ectatic Ao, NAD;  Labs include BMet=wnl, BNP=110, UA=clear;  Rx w/ Zyrtek, Saline, Nasonex, Pepcid, Mucinex, & keep appt w/ DrStark for swallowing eval... Now c/o same constellation of symptoms more or less, never took the Zyrtek or Nasonex (too$$) & we discussed regimen w/ Zyrtek AM, Saline/ Mucinex during the day/ Flonase Qhs...    Dyspnea>  On Mucinex plus her anxiolytics (Alprazolam) but not taking regularly...    HBP>  On Coreg, Norvasc, Cozaar, Lasix & BP= 110/64 & well controlled, she thinks too many meds...    GI>  She has hx HH, gastritis, divertics, constip, & functional abd pain w/ mult extensive evaluations in the past;  On Omep20, Miralax, Senakot-S but she persists w/ c/o swelling & "something  wrong in my stomach it's getting bigger & bigger- I might have cancer"; last CT Abd 1/11 reviewed- divertics, otherw neg;  States her appetite is OK but weight down 12#;  C/o left side of abd hurts when she gets up to walk, no pain sitting or supine;  Offered additional meds (try Levsin or bentyl but she declines);  She has up coming appt w/ DrStark.    DJD>  Followed by DrWhitfield & she's had epid shots by DrNewton in the past    Anxiety>  On Alprazolam but she won't take it regularly for help w/ her severe anxiety & cancer phobia...  ~  June 12, 2011:  55mo ROV & she persists w/ mult somatic complaints & flight of ideas hx, "I have really suffered" she says...    GI> Hx HH, gastritis, divertics, & vague abd pains> she is not happy w/ DrStark's eval & sought second opinion (DrMann declined to see her); she saw DrJEdwards & his notes are reviewed;  Today she is quite distraught about papers to fill out for the GI dept at [facility]- DrNThorne...    Ortho> c/o pain in left leg w/ some edema (she has VI & dependent edema) & states her legs feel like lead; exam shows some incr in LE edema & we discussed incr in her Lasix from 20mg  to 40mg /d plus a 2gm sodium diet...    We rechecked her  labs today> all WNL.Marland Kitchen.  ~  July 09, 2011:  75mo ROV & add-on due to sm area of dermatitis on her left leg, sl excoriated & sm scab==> her son is here today & told me she drove him crazy w/ her ruminations about this being a cancer & that we would send her to the cancer center, etc (she got angry at him for telling me); we discussed dermatitis & rec RX w/ LIDEX-E cream to apply Bid & rec Derm review if not resolved...    She is also quite concerned about a "bump" on the back of her head==> occipital bone & it's symmetric, she is reassured...    She has been to [facility] DrThorne & note is pending but she has lab sheet (all wnl) & is sch for EGD 9/12; they told her to stop Metamucil, and take Miralax, Senakot; she is most concerned/  upset by an evaluation form she received asking questions like how long she waited for this or that during her visit & she doesn't know what to do or how to complete the info! I encouraged her to take her Alpraz regularly Tid...  ~  September 22, 2011:  2.77mo ROV & she states "terrible" c/o phlegm, swelling, feeling bad, wt loss, "this throat business- they found white balls"; very anxious w/ mult somatic complaints >>    HBP> on Norvasc2.5, Coreg3.125Bid, Losartan50, Lasix20-40/d; she went to the ER w/ ?angioedema of the tongue, seen by [facility] who called me & we decided to stop her Cozaar; BP today off Cozaar & on the other 3 meds showed 122/72> rec to continue the same...    GI> she [facility]12 for a comprehensive GI eval> note reviewed- they did an EGD 08/06/11 w/ mult white nodular plaques seen in cardia & fundus- Bx'd & pending (she is very concerned);  she went to the ER 08/16/11 w/ abd discomfort- the same vague left side abd discomfort that she has seen mult physicians about- Exam showed mild diffuse tenderness;  they did CT Abd showing divertics, no inflamm, right renal & left hep lobe cysts, atherosclerotic changes, NAD;  Labs & urine were essentially WNL;  EKG showed NSR, RBBB & LAD, no acute changes...  she saw DrEdwards [facility]) 10/12 w/ mult complaints including "a knot on her skull & she feels that her brains are coming out"; he thought she was doing reasonably well from the GI standpoint w/ her dysphagia, vague abd pain, & constip...    Ortho> she saw DrWhitfield w/ MRI Lumbar spine 10/12 showing degenerative changes w/ mild progression since 7/10 MRI, mild-mod sp stenosis & lat recess stenosis at several levels; she has Vicodin to take up to 3/d as needed for pain; she is anxiously awaiting f/u visit w/ DrWhitfield...    Derm> DrLupton saw pt 07/23/11 w/ removal of sm squamous cell ca (in situ) from left lower leg  ~  December 29, 2011:  23mo ROV & Heather [name] has her usual  complement of somatic complaints> all seem stable w/o appreciable change; she does not want to see DrJEdwards again, but is happy w/ eval & communication from DrThorne at [facility] says her abd pain is coming from the arthritis in her back, & she has seen DrWhitfield for that... She lists mult OTC GI meds including> Prilosec, Miralax, Metamucil, Senakot, Mylicon...    BP controlled on her Coreg, Amlodipine, Lasix; BP= 120/68 today & denies CP/ palpit, ch in DOE, edema, etc..Marland Kitchen  She saw DrThorne recently for f/u & note pending; prev note 11/12 reviewed> she has done a thorough investigation...    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  ~  Mar 29, 2012:  438mo ROV & she continues w/ mult complaints- ran out of Dymista sample for nasal symptoms (didn't help), thinks her BP is too low, c/o swelling (has VI- reminded to elim sodium, wear support hose, elevate, take Lasix40), "constip" but stool is loose & soft (rec- Align), saw DrEdwards again for GI- she denies dysphagia, still c/o chr abd discomfort, etc; c/o "dark urine" it was clear and C&S was neg; she requires constant reassurance about her condition & that she does not have cancer; asked to take her Xanax more regularly...  We reviewed prob list, meds, xrays and labs>  See below>>   ~  June 28, 2012:  438mo ROV & Heather [name] continues to have mult somatic complaints & says "terrible, terrible, terrible" states she's lost all the muscles in her abd & now uses a girdle; "all my doctors say for me to see you" eg> DrEdwards, DrNishan, etc; she still believes that she has cancer "somewhere- just like my friend"... Then she launches into a flight of ideas of complaints> carpel tunnel symptoms (she wants to f/u w/ DrWhtfield), cough/ phlegm, dizzy, "it's my stomach", etc...    We reviewed prob list, meds, xrays and labs> see below for updates  >>  ~  August 03, 2012:  38mo ROV & add-on at pt's insistance for "hocking- it's not a cough, it's a hock"; then she proceeds to clear her throat forcefully & "hock-up" some clear phlegm; she notes "thick white foamy slimy sputum w/ flecks of chopped meat in it"; she denies cough/ hemoptysis/ fcs, etc; she notes some mild dyspnea (at baseline) and occas CP from the force of the hocking; she is quite insistent on additional work-up & Rx for this perceived problem:     Exam is clear- no oral lesions, neck is neg, chest is clear...    CXR shows sl overinflation, clear lungs, normal heart size w/ elongation & tortuous Ao...    PFTs are wnl> FVC=2.62 (128%), FEV1=2.20 (158%), %1sec=84; mid-flows=274% of predicted...    Note> she is quite anxious & has a cancer phobia; again asked to take her Alprazolam Tid regularly... We reviewed prob list, meds, xrays and labs> see below for update >> We decided on a trial regimen to help her "hocking" w/ short term f/u to assess efficacy> MUCINEX 600mg - 2Bid w/ lots of fluids; HYCODAN syrup- 1tspQid; DULERA100/5- 1puffBid then rinse w/ Listerine... Note> before completing her eval today she insisted in a right breast exam stating that DrBertrand insisted that her PCP examine her breast at every visit> it was neg.           Problem List:  RESPIRATORY SYMPTOMS >> c/o "hocking phlegm" DYSPNEA (ICD-786.9) - eval 5/11 c/o SOB- can't get a DB, can't get enough air in, etc... occurs at rest & w/ exerc "I'm very active despite my arthritis", talks rapidly in long sentences w/o apparent distress... no cough, phlegm, hemoptysis, etc- but as usual she is quite distressed by these symptoms and wants further eval> we discussed re-assessment w/ CXR (clear, NAD);  PFT (totally norm airflow);  Labs (all WNL);  Rx w/ ALPRAZOLAM 0.5mg  Tid & encouraged to take regularly. ~  CXR 4/13 showed norm heart size, clear lungs, mild TSpine spondylosis... ~  She continues to complain of  drainage, throat symptoms, etc; Rx w/ Mucinex, OTC antihist, Nasonex, etc;  Seen by ENT, DrRosen w/ ?xerostomia & rec incr water etc. ~  9/13: Add-on visit for "hocking, it's not a cough, it's a hock"> see above... ~  9/13: CXR shows sl overinflation, clear lungs, normal heart size w/ elongation & tortuous Ao;  PFTs are wnl> FVC=2.62 (128%), FEV1=2.20 (158%), %1sec=84; mid-flows=274% of predicted... We discussed trial rx for her symptoms- Mucinex, Fluids, Hycodan, Dulera100- 1puffbid.  HYPERTENSION (ICD-401.9) - on AMLODIPINE 2.5mg /d,  COREG 3.125mg Bid,  LASIX 40mg - taking 1/2tab/d because 1 tab makes her too weak...  ~  7/12:  C/o incr edema in legs & advised to elim sodium from diet & incr her LASIX to 40mg /d... ~  10/12:  Prev Cozaar 50mg  was stopped 10/12 via the ER when she presented w/ ?angioedema of the tongue... ~  2/13:  BP= 120/68 & doing well on 3 med regimen & off Losartan. ~  5/13:  BP= 110/60 & she has mult somatic complaints but denies HA, CP, palpit, ch in SOB or edema... ~  8/13:  BP= 112/58 & there is no change in her mult somatic complaints... ~  9/13:  BP= 100/60 on Coreg3.125Bid & Amlod2.5.Marland KitchenMarland Kitchen  Hx of CHEST PAIN (ICD-786.50) - she takes ASA 81mg /d + Tylenol Prn... followed by DrBensimhon for Cards. ~  Baseline EKG w/ RBBB & LAD, no acute changes... ~  NuclearStressTest done 2007 & 2009 showed normal- without scar or ischemia, EF=67%...   ~  2DEcho 7/08 showed mild AI, mild diastolic dysfunction, EF=55-60%. ~  CT Abd 1/09 & 9/12 showed incidental coronary calcif, & aorto-iliac atherosclerotic changes as well... ~  8/13: she saw DrNishan> HBP, DD, atypCP w/ neg Myoviews & no changes made...  VENOUS INSUFFICIENCY (ICD-459.81) - she has mod VI and follows a low sodium diet, elevates legs when able, and wears support hose. ~  7/12:  C/o incr edema & we reviewed a 2gm sodium diet & increased her Lasix from 20mg /d to 40mg /d... ~  5/13:  She has persist complaints but mild chr VI w/  min edema & reminded to elim sodium, elev, support hose, take the Lasix...  HIATAL HERNIA (ICD-553.3) & GASTRITIS (ICD-535.50) - on OMEPRAZOLE 20mg /d & followed by DrJEdwards for GI having fired DrStark & [facility] in past. ~  last EGD was 2/08 w/ 3cm HH & gastitis... ~  CT Abd 1/09 revealed calcif gallstones, & mild ectasia of AA... ~  CT Abd 1/11 showed left colon divertics, tiny hepatic lesions w/o change & right renal cyst stable. ~  CT Abd 9/12 showed divertics (no inflamm), right renal & left hep lobe cysts, atherosclerotic changes, NAD... ~  Seen by DrEdwards 4/13> c/o dysphagia & congestion; he notes mult GI complaints, known esoph dysmotility w/o stricture, hard to pin down her complaints...  DIVERTICULOSIS, COLON (ICD-562.10) - followed regularly by DrStark. ~  colonoscopy 7/03 by DrStark showing divertics only... we discussed Miralax + Senakot-S for constipation. ~  colonoscopy 2/11 showed divertics, 3mm polyp, hems...  ? of ABDOMINAL PAIN & SYMPTOMATOLOGY - she had a very thorough work up in 2009> she has a cancer phobia and is very difficult to reassure her that everything appropriate has been done & no cancer found... Mult additional GI evals since then from [facility], [facility] (DrEdwards), [facility] (DrThorne)... ~  eval by GI- DrStark 12/08 but pt  wasn't satisfied w/ his examination... ~  full labs 1/09 were WNL, sed=20... ~  CT Abd/ Pelvis 1/09= no acute abn in abd (calcif gallstones & mild ectasia of AA), calcif fibroid in the pelvis, & ?regarding the left ovary. ~  referral to DrFontaine, GYN> his notes are reviewed... ~  follow up w/ DrKimbrough, Urology> his notes are reviewed... ~  eval DrWhitfield for Orthopedics- rec shots in back by [facility] w/ some improvement. ~  repeat GI eval 1/11 by DrStark w/ CT & colonoscpy as above- no acute problems. ~  5/11 & 7/11 >> persist complaints w/ neg recheck by DrStark & DrKimbrough... ~  12/11:  routine GYN surveillance DrFontaine found  anteverted uterus w/ fibroids, atrophic ovaries bilat, no mass. ~  Repeat GI eval 4/12 by DrStark> he tried to reassure the pt, felt some symptoms might be from poor abd wall muscle tone, rec continue Omep, laxatives... ~  6/12:  She sought another GI opinion DrEdwards> he did not feel she needed any additional testing... ~  She tells me she has yet another appt to see a gastroenterologist at [facility]==> seen by DrThorne 8/12 & note reviewed... ~  EGD by DrThorne 9/12 w/ mult whitish nodular plaques in cardia & fundus> Bx results pending & she is very concerned (remember her hx cancer phobia). ~  She continues to f/u w/ [facility] at [facility] & is happy w/ her eval...  RENAL CYST (ICD-593.2) - she has a 1 cm right renal cyst seen on CT in 2007 and followed by DrKimbrough... f/u CT Abd 1/09 - no change in the cyst... f/u renal ultrasound 1/10 w/ small bilat cysts... f/u CT Abd 1/11- no change in cyst. ~  she saw DrKimbrough 7/11 for LBP, microhematuria, urge incontinence- stable, no additional Rx. ~  she saw DrKimbrough 1/12 for urge incontinence, freq, nocturia> rec Desmopressin 0.2mg - 1/2 tab Qhs.  CARCINOMA, BREAST, LEFT (ICD-174.9) - Surgery 9/94 by DrBlievernicht w/ Left modified radical mastectomy... +estrogen receptors and Rx w/ Tamoxifen... f/u mammograms all neg... breast exam on right has been neg- no nodules palpated... She says that DrBertrand insisted that her PCP check her right breast at each & every office visit to be sure she doesn't develop any nodules...  DEGENERATIVE JOINT DISEASE (ICD-715.90) - on OTC meds + VICODIN Prn... she has end-stage OA of Right Knee per DrWhitfield treated w/ shots in the past... she has a knee brace and a cane for ambulation... ~  8/13:  She wants a better pain pill & we discussed change to [facility] 5/325 Tid prn...  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 Rx w/ rest, heat, Percocet, and  f/u w/ Ortho...  Hx of LOW BACK PAIN, CHRONIC (ICD-724.2) - eval DrWhitfield w/ rec for shots by DrNewton & she's feeling sl better & wears back brace... ~  5/10: mult somatic complaints- primarily c/o left side/ postero-lat rib pain...  Exam w/ tender left lat/ post ribs... Bone Scan= neg... ~  MRI Lumbar spine 7/10 by DrWhitfield... ~  Epidural steroid shot by DrNewton 8/10 & 1/11 ~  Repeat LBP eval by DrWhitfield w/ another MRI lumbar spine showing degenerative changes w/ mild progression since 7/10 MRI, mild-mod sp stenosis & lat recess stenosis at several levels ~  DrWhitfield referred her to [facility] to consider more shots but she is not so inclined... ~  6/13: she saw DrTooke w/ lumbar spondylosis; he felt that he had nothing to  offer her & rec incr Tramadol to Qid...  CARPAL TUNNEL SYNDROME, BILATERAL (ICD-354.0) - she had prev right carpal tunnel release by DrWhitfield and was c/o increasing discomfort in her left wrist despite wrist splint Qhs- had left carpal tunnel release 1/10 by DrWhitfield...  ANXIETY (ICD-300.00) - on ALPRAZOLAM 0.5mg  Tid & encouraged to take it regularly... she has a cancer phobia & it is very difficult to reassure her regarding her symptoms and our evaluations... ~  7/13: she went to the ER c/o swelling of the tongue but eval by EDP indicated that her tongue was normal & not swollen, she was not on an ACE, given Pred Rx anyway.   Past Surgical History  Procedure Date  . Left mastectomy   . Carpal tunnel release     Outpatient Encounter Prescriptions as of 08/03/2012  Medication Sig Dispense Refill  . ALPRAZolam (XANAX) 0.5 MG tablet Take 0.5 mg by mouth 3 (three) times daily as needed. For anxiety      . amLODipine (NORVASC) 2.5 MG tablet Take 1 tablet (2.5 mg total) by mouth daily.  30 tablet  3  . aspirin EC 81 MG tablet Take 81 mg by mouth daily.      Marland Kitchen azithromycin (ZITHROMAX) 250 MG tablet Take as dircted  6 tablet  0  . carvedilol (COREG) 3.125 MG  tablet Take 1 tablet (3.125 mg total) by mouth 2 (two) times daily with a meal.  60 tablet  11  . Cholecalciferol (VITAMIN D3) 1000 UNITS CAPS Take 1 capsule by mouth daily.      . furosemide (LASIX) 40 MG tablet Take 40 mg by mouth daily. For swelling      . guaiFENesin (MUCINEX) 600 MG 12 hr tablet Take 600 mg by mouth 2 (two) times daily.       Marland Kitchen HYDROcodone-acetaminophen (NORCO) 5-325 MG per tablet Take 1/2 to 1 tablet by mouth three times daily as needed for pain  90 tablet  5  . Multiple Vitamins-Minerals (MULTIVITAMINS THER. W/MINERALS) TABS Take 1 tablet by mouth daily.      Marland Kitchen omeprazole (PRILOSEC) 20 MG capsule Take 20 mg by mouth daily.      . polyethylene glycol (MIRALAX / GLYCOLAX) packet Take 17 g by mouth daily as needed. For constipation      . PRESCRIPTION MEDICATION Place 1 drop into both eyes daily. Eye drops (samples were given to patient by Dr. Dione Booze)      . sennosides-docusate sodium (SENOKOT-S) 8.6-50 MG tablet Take 1 tablet by mouth daily as needed. For constipation      . traMADol (ULTRAM) 50 MG tablet Take 50 mg by mouth every 6 (six) hours as needed. As needed for pain        Allergies  Allergen Reactions  . Fentanyl     REACTION: nausea    Current Medications, Allergies, Past Medical History, Past Surgical History, Family History, and Social History were reviewed in [employer] record.    Review of Systems      See HPI - all other systems neg except as noted... She has mult somatic complaints and anxiety. The patient complains of decreased hearing, chest discomfort, dyspnea on exertion, abdominal pain, incontinence, muscle weakness, and difficulty walking.  The patient denies anorexia, fever, vision loss, hoarseness, syncope, peripheral edema, prolonged cough, headaches, hemoptysis, melena, hematochezia, severe indigestion/heartburn, hematuria, transient blindness, depression, abnormal bleeding, enlarged lymph nodes, and angioedema.      Objective:   Physical Exam  WD, WN, 78 y/o BF in NAD... she is very anxious... GENERAL:  Alert & oriented; pleasant & cooperative... HEENT:  [place]/AT, EOM-wnl, PERRLA, EACs-clear, TMs-wnl, NOSE-clear, THROAT-clear & wnl, no lesions seen. NECK:  Neck ROM decreased, no JVD; normal carotid impulses w/o bruits; no thyromegaly or nodules palpated; no lymphadenopathy. CHEST:  Clear to P & A; without wheezes/ rales/ or rhonchi heard... + tender right lat 11th & 12th ribs on palp. BREAST:  s/p left mastectomy... right breast normal- without nodules palpated... HEART:  Regular Rhythm; without murmurs/ rubs/ or gallops detected... ABDOMEN:  Soft & nontender; normal bowel sounds; no organomegaly or masses palpated... EXT: without deformities, mod arthritic changes; no varicose veins/ +venous insuffic/ tr edema. NEURO:  CN's intact; no focal neuro deficits... DERM:  Small sl excoriated area of dermatitis left shin- no drainage,no rash, etc...  RADIOLOGY DATA:  Reviewed in the EPIC EMR & discussed w/ the patient...  LABORATORY DATA:  Reviewed in the EPIC EMR & discussed w/ the patient...   Assessment & Plan:    RESPIRATORY SYMPTOMS w/ Dyspnea & throat clearing>  She had a neg pulm eval 2011 as above; she insisted on additional eval 9/13 for her chr throat clearing symptom; CXR w/o acute changes & PFTs normal for her 78 y/o lungs; she is quite insistent on med rx for her problem & we reviewed Mucinex, Hycodan, Dulera, & Listerine; pt very anxious & Alprazolam seems to help when she takes it...  HBP>  Controlled on low doses of several meds including Coreg, Norvasc & Lasix;  She is encouraged to take meds regularly...  Hx CP> evaluated for Cards by DrBensimon & he has not been in favor of invasive testing; stable on conservative med rx...  VI/ Edema>  She is concerned about incr edema & we reviewed 2gm sodium diet & rec keep Lasix at 40mg /d...  GI> HH, Gastritis, Divertics, vague abd pain>  I  tried once again to reassure her about the thorough GI evaluations that her has undergone; she is happy w/ DrThorne's eval & will f/u w/ her & in the meanwhile continue rx w/ Prilosec40, Miralax, Metamucil, Senakot, Mylicon, etc...  Renal cyst>  This is benign & has been followed by DrKimbrough...  Hx left Breast Cancer>  No known recurrence...  DJD> Cervical spondylosis & chronic LBP>  She has been evaluated by DrWhitfield for Ortho & given epid steroid shots by DrNewton... She wants a better pain pill & we'll try NORCO 5/325 tid as needed...  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  Anxiety>  Asked once again to increase the Alprazolam to 1/2 - 1 tab TID regularly...   Patient's Medications  New Prescriptions   HYDROCODONE-HOMATROPINE (HYCODAN) 5-1.5 MG/5ML SYRUP    Take 1 teaspoon by mouth four times a day.   MOMETASONE-FORMOTEROL (DULERA) 100-5 MCG/ACT AERO    Inhale 1 puff into the lungs 2 (two) times daily.  Previous Medications   ALPRAZOLAM (XANAX) 0.5 MG TABLET    Take 0.5 mg by mouth 3 (three) times daily as needed. For anxiety   AMLODIPINE (NORVASC) 2.5 MG TABLET    Take 1 tablet (2.5 mg total) by mouth daily.   ASPIRIN EC 81 MG TABLET    Take 81 mg by mouth daily.   AZITHROMYCIN (ZITHROMAX) 250 MG TABLET    Take as dircted   CARVEDILOL (COREG) 3.125 MG TABLET    Take 1 tablet (3.125 mg total)  by mouth 2 (two) times daily with a meal.   CHOLECALCIFEROL (VITAMIN D3) 1000 UNITS CAPS    Take 1 capsule by mouth daily.   FUROSEMIDE (LASIX) 40 MG TABLET    Take 40 mg by mouth daily. For swelling   GUAIFENESIN (MUCINEX) 600 MG 12 HR TABLET    Take 1,200 mg by mouth 2 (two) times daily.    HYDROCODONE-ACETAMINOPHEN (NORCO) 5-325 MG PER TABLET    Take 1/2 to 1 tablet by mouth three times daily as needed for pain   MULTIPLE VITAMINS-MINERALS (MULTIVITAMINS THER. W/MINERALS) TABS    Take 1 tablet by mouth daily.   POLYETHYLENE GLYCOL  (MIRALAX / GLYCOLAX) PACKET    Take 17 g by mouth daily as needed. For constipation   PRESCRIPTION MEDICATION    Place 1 drop into both eyes daily. Eye drops (samples were given to patient by Dr. Dione Booze)   SENNOSIDES-DOCUSATE SODIUM (SENOKOT-S) 8.6-50 MG TABLET    Take 1 tablet by mouth daily as needed. For constipation   TRAMADOL (ULTRAM) 50 MG TABLET    Take 50 mg by mouth every 6 (six) hours as needed. As needed for pain  Modified Medications   Modified Medication Previous Medication   OMEPRAZOLE (PRILOSEC) 20 MG CAPSULE omeprazole (PRILOSEC) 20 MG capsule      Take 2 capsules (40 mg total) by mouth daily.    Take 20 mg by mouth daily.  Discontinued Medications   No medications on file

## 2012-08-05 ENCOUNTER — Telehealth: Payer: Self-pay | Admitting: Pulmonary Disease

## 2012-08-05 ENCOUNTER — Encounter: Payer: Self-pay | Admitting: Pulmonary Disease

## 2012-08-05 NOTE — Telephone Encounter (Signed)
I spoke with pt and she stated she was just calling to let us know she is going to take the hycodan cough syrup and not her pain medication while she is taking the cough syrup.

## 2012-08-23 ENCOUNTER — Telehealth: Payer: Self-pay | Admitting: Pulmonary Disease

## 2012-08-23 NOTE — Telephone Encounter (Signed)
Called and spoke with pt and she stated that the dulera is making her feel stopped up/choking feeling and she has stopped this med.  Her mouth feels like it is glued together at night.  She stopped this med on Friday and her mouth has been feeling better--mouth does not feel as dry and her face is not swollen now.  Pt is now wanting to know how to proceed.  SN please advise.   Allergies  Allergen Reactions  . Fentanyl     REACTION: nausea

## 2012-08-23 NOTE — Telephone Encounter (Signed)
ATC line busy [facility] 

## 2012-08-23 NOTE — Telephone Encounter (Signed)
Per SN---- glad she is doing better, leave the dulera off---there is no replacement for this med.   thanks

## 2012-08-24 NOTE — Telephone Encounter (Signed)
I spoke with pt and is aware of SN res. She voiced her understanding and had no questions

## 2012-08-30 ENCOUNTER — Encounter: Payer: Self-pay | Admitting: Pulmonary Disease

## 2012-08-30 ENCOUNTER — Encounter: Payer: Self-pay | Admitting: *Deleted

## 2012-08-30 ENCOUNTER — Other Ambulatory Visit (INDEPENDENT_AMBULATORY_CARE_PROVIDER_SITE_OTHER): Payer: Medicare Other

## 2012-08-30 ENCOUNTER — Ambulatory Visit (INDEPENDENT_AMBULATORY_CARE_PROVIDER_SITE_OTHER): Payer: Medicare Other | Admitting: Pulmonary Disease

## 2012-08-30 VITALS — BP 120/58 | HR 70 | Temp 97.4°F | Ht 64.0 in | Wt 155.6 lb

## 2012-08-30 DIAGNOSIS — I519 Heart disease, unspecified: Secondary | ICD-10-CM

## 2012-08-30 DIAGNOSIS — K5730 Diverticulosis of large intestine without perforation or abscess without bleeding: Secondary | ICD-10-CM

## 2012-08-30 DIAGNOSIS — M545 Low back pain, unspecified: Secondary | ICD-10-CM

## 2012-08-30 DIAGNOSIS — I872 Venous insufficiency (chronic) (peripheral): Secondary | ICD-10-CM

## 2012-08-30 DIAGNOSIS — K5900 Constipation, unspecified: Secondary | ICD-10-CM

## 2012-08-30 DIAGNOSIS — F411 Generalized anxiety disorder: Secondary | ICD-10-CM

## 2012-08-30 DIAGNOSIS — I10 Essential (primary) hypertension: Secondary | ICD-10-CM

## 2012-08-30 DIAGNOSIS — R0602 Shortness of breath: Secondary | ICD-10-CM

## 2012-08-30 DIAGNOSIS — K219 Gastro-esophageal reflux disease without esophagitis: Secondary | ICD-10-CM

## 2012-08-30 DIAGNOSIS — M1990 Unspecified osteoarthritis, unspecified site: Secondary | ICD-10-CM

## 2012-08-30 DIAGNOSIS — Z853 Personal history of malignant neoplasm of breast: Secondary | ICD-10-CM

## 2012-08-30 DIAGNOSIS — K449 Diaphragmatic hernia without obstruction or gangrene: Secondary | ICD-10-CM

## 2012-08-30 DIAGNOSIS — M47812 Spondylosis without myelopathy or radiculopathy, cervical region: Secondary | ICD-10-CM

## 2012-08-30 LAB — BASIC METABOLIC PANEL
BUN: 19 mg/dL (ref 6–23)
Calcium: 8.9 mg/dL (ref 8.4–10.5)
GFR: 63.01 mL/min (ref >60.00)
Glucose, Bld: 94 mg/dL (ref 70–99)

## 2012-08-30 LAB — CBC WITH DIFFERENTIAL/PLATELET
Basophils Absolute: 0 10*3/uL (ref 0.0–0.1)
Eosinophils Absolute: 0.1 10*3/uL (ref 0.0–0.7)
HCT: 41 % (ref 36.0–46.0)
Lymphs Abs: 2.9 10*3/uL (ref 0.7–4.0)
MCHC: 32.7 g/dL (ref 30.0–36.0)
Monocytes Absolute: 0.7 10*3/uL (ref 0.1–1.0)
Monocytes Relative: 12 % (ref 3.0–12.0)
Platelets: 206 10*3/uL (ref 150.0–400.0)
RDW: 14.4 % (ref 11.5–14.6)

## 2012-08-30 LAB — HEPATIC FUNCTION PANEL: Albumin: 3.7 g/dL (ref 3.5–5.2)

## 2012-08-30 NOTE — Patient Instructions (Addendum)
Today we updated your med list in our EPIC system...    Continue your current medications the same...  Today we did your follow up labs...    We will call you w/ the results...  Let's plan a follow up visit i 6 weeks time.Marland KitchenMarland Kitchen

## 2012-08-30 NOTE — Progress Notes (Signed)
Subjective:    Patient ID: Heather [name], female    DOB: 1918/04/17, 78 y.o.   MRN: [id number]  HPI 78 y/o BF here for a follow up visit... she has multiple medical problems including:  Dyspnea & Anxiety;  HBP;  Hx CP followed by DrBensimhon;  HH/ Gastritis;  Divertics/ Constip;  Functional Abd Pain;  Renal cyst followed by DrKimbrough;  Hx left breast [phone];  DJD/ CSpine spondy/ LBP/ CTS; and she has a cancer phobia...  ~  December 26, 2010:  add-on appt due to right side pain x4d she says> "I feel fine, BUT..." states the right post CWP is unbearable, sharp, worse w/ movement, & denies trauma... area over the right 11-12th ribs is tender to palp & reproduces her discomfort;  chest is clear & no rubs w/ O2 sat 98% RA;  last CXR 9/11 clear, some scarring, ectatic Ao, DJD sp;  she wants to relate this all to her prev complaints of Abd pain & swelling, left flank pain, etc (recall extensive evals w/ nothing signif found to explain her varied symptoms)...  we discussed rx w/ rest, heat, rib binder, Lidoderm patches, Vicodin Prn.  ~  February 10, 2011:  6wk ROV- she saw TP 3/16 w/ cough & mult somatic complaints trouble swallowing, throat closing, drainage, nasal congestion, "harking phlegm" etc;  CXR= stable, ectatic Ao, NAD;  Labs include BMet=wnl, BNP=110, UA=clear;  Rx w/ Zyrtek, Saline, Nasonex, Pepcid, Mucinex, & keep appt w/ DrStark for swallowing eval... Now c/o same constellation of symptoms more or less, never took the Zyrtek or Nasonex (too$$) & we discussed regimen w/ Zyrtek AM, Saline/ Mucinex during the day/ Flonase Qhs...    Dyspnea>  On Mucinex plus her anxiolytics (Alprazolam) but not taking regularly...    HBP>  On Coreg, Norvasc, Cozaar, Lasix & BP= 110/64 & well controlled, she thinks too many meds...    GI>  She has hx HH, gastritis, divertics, constip, & functional abd pain w/ mult extensive evaluations in the past;  On Omep20, Miralax, Senakot-S but she persists w/ c/o swelling &  "something wrong in my stomach it's getting bigger & bigger- I might have cancer"; last CT Abd 1/11 reviewed- divertics, otherw neg;  States her appetite is OK but weight down 12#;  C/o left side of abd hurts when she gets up to walk, no pain sitting or supine;  Offered additional meds (try Levsin or bentyl but she declines);  She has up coming appt w/ DrStark.    DJD>  Followed by DrWhitfield & she's had epid shots by DrNewton in the past    Anxiety>  On Alprazolam but she won't take it regularly for help w/ her severe anxiety & cancer phobia...  ~  June 12, 2011:  1mo ROV & she persists w/ mult somatic complaints & flight of ideas hx, "I have really suffered" she says...    GI> Hx HH, gastritis, divertics, & vague abd pains> she is not happy w/ DrStark's eval & sought second opinion (DrMann declined to see her); she saw DrJEdwards & his notes are reviewed;  Today she is quite distraught about papers to fill out for the GI dept at [facility]- DrNThorne...    Ortho> c/o pain in left leg w/ some edema (she has VI & dependent edema) & states her legs feel like lead; exam shows some incr in LE edema & we discussed incr in her Lasix from 20mg  to 40mg /d plus a 2gm sodium diet.Marland KitchenMarland Kitchen  We rechecked her labs today> all WNL.Marland Kitchen.  ~  July 09, 2011:  65mo ROV & add-on due to sm area of dermatitis on her left leg, sl excoriated & sm scab==> her son is here today & told me she drove him crazy w/ her ruminations about this being a cancer & that we would send her to the cancer center, etc (she got angry at him for telling me); we discussed dermatitis & rec RX w/ LIDEX-E cream to apply Bid & rec Derm review if not resolved...    She is also quite concerned about a "bump" on the back of her head==> occipital bone & it's symmetric, she is reassured...    She has been to [facility] GI Dept DrThorne & note is pending but she has lab sheet (all wnl) & is sch for EGD 9/12; they told her to stop Metamucil, and take Miralax, Senakot; she is most  concerned/ upset by an evaluation form she received asking questions like how long she waited for this or that during her visit & she doesn't know what to do or how to complete the info! I encouraged her to take her Alpraz regularly Tid...  ~  September 22, 2011:  2.95mo ROV & she states "terrible" c/o phlegm, swelling, feeling bad, wt loss, "this throat business- they found white balls"; very anxious w/ mult somatic complaints >>    HBP> on Norvasc2.5, Coreg3.125Bid, Losartan50, Lasix20-40/d; she went to the ER w/ ?angioedema of the tongue, seen by [facility] who called me & we decided to stop her Cozaar; BP today off Cozaar & on the other 3 meds showed 122/72> rec to continue the same...    GI> she saw DrThorne at [facility] 9/12 for a comprehensive GI eval> note reviewed- they did an EGD 08/06/11 w/ mult white nodular plaques seen in cardia & fundus- Bx'd & pending (she is very concerned);  she went to the ER 08/16/11 w/ abd discomfort- the same vague left side abd discomfort that she has seen mult physicians about- Exam showed mild diffuse tenderness;  they did CT Abd showing divertics, no inflamm, right renal & left hep lobe cysts, atherosclerotic changes, NAD;  Labs & urine were essentially WNL;  EKG showed NSR, RBBB & LAD, no acute changes...  she saw DrEdwards [facility]) 10/12 w/ mult complaints including "a knot on her skull & she feels that her brains are coming out"; he thought she was doing reasonably well from the GI standpoint w/ her dysphagia, vague abd pain, & constip...    Ortho> she saw DrWhitfield w/ MRI Lumbar spine 10/12 showing degenerative changes w/ mild progression since 7/10 MRI, mild-mod sp stenosis & lat recess stenosis at several levels; she has Vicodin to take up to 3/d as needed for pain; she is anxiously awaiting f/u visit w/ DrWhitfield...    Derm> DrLupton saw pt 07/23/11 w/ removal of sm squamous cell ca (in situ) from left lower leg  ~  December 29, 2011:  55mo ROV & Heather [name] has her  usual complement of somatic complaints> all seem stable w/o appreciable change; she does not want to see DrJEdwards again, but is happy w/ eval & communication from DrThorne at [facility] says her abd pain is coming from the arthritis in her back, & she has seen DrWhitfield for that... She lists mult OTC GI meds including> Prilosec, Miralax, Metamucil, Senakot, Mylicon...    BP controlled on her Coreg, Amlodipine, Lasix; BP= 120/68 today & denies CP/ palpit, ch in DOE,  edema, etc...    She saw DrThorne recently for f/u & note pending; prev note 11/12 reviewed> she has done a thorough investigation...    She saw DrWhitfield 12/12 for f/u LBP w/ known DJD & severe sp stenosis L4-5 & neurogenic claudication; she did PT & has a brace, he referred her to [facility] for shots...  ~  Mar 29, 2012:  66mo ROV & she continues w/ mult complaints- ran out of Dymista sample for nasal symptoms (didn't help), thinks her BP is too low, c/o swelling (has VI- reminded to elim sodium, wear support hose, elevate, take Lasix40), "constip" but stool is loose & soft (rec- Align), saw DrEdwards again for GI- she denies dysphagia, still c/o chr abd discomfort, etc; c/o "dark urine" it was clear and C&S was neg; she requires constant reassurance about her condition & that she does not have cancer; asked to take her Xanax more regularly...  We reviewed prob list, meds, xrays and labs>  See below>>   ~  June 28, 2012:  66mo ROV & Heather [name] continues to have mult somatic complaints & says "terrible, terrible, terrible" states she's lost all the muscles in her abd & now uses a girdle; "all my doctors say for me to see you" eg> DrEdwards, DrNishan, etc; she still believes that she has cancer "somewhere- just like my friend"... Then she launches into a flight of ideas of complaints> carpel tunnel symptoms (she wants to f/u w/ DrWhtfield), cough/ phlegm, dizzy, "it's my stomach", etc...    We reviewed prob list, meds, xrays and labs> see below for  updates >>  ~  August 03, 2012:  12mo ROV & add-on at pt's insistance for "hocking- it's not a cough, it's a hock"; then she proceeds to clear her throat forcefully & "hock-up" some clear phlegm; she notes "thick white foamy slimy sputum w/ flecks of chopped meat in it"; she denies cough/ hemoptysis/ fcs, etc; she notes some mild dyspnea (at baseline) and occas CP from the force of the hocking; she is quite insistent on additional work-up & Rx for this perceived problem:     Exam is clear- no oral lesions, neck is neg, chest is clear...    CXR shows sl overinflation, clear lungs, normal heart size w/ elongation & tortuous Ao...    PFTs are wnl> FVC=2.62 (128%), FEV1=2.20 (158%), %1sec=84; mid-flows=274% of predicted...    Note> she is quite anxious & has a cancer phobia; again asked to take her Alprazolam Tid regularly... We reviewed prob list, meds, xrays and labs> see below for update >> We decided on a trial regimen to help her "hocking" w/ short term f/u to assess efficacy> MUCINEX 600mg - 2Bid w/ lots of fluids; HYCODAN syrup- 1tspQid; DULERA100/5- 1puffBid then rinse w/ Listerine... Note> before completing her eval today she insisted in a right breast exam stating that DrBertrand insisted that her PCP examine her breast at every visit> it was neg.  ~  August 30, 2012:  12mo ROV & recheck> "I'm just falling apart, meds not helping, Dulera choked me up, mouth glued together" therefore she stopped the [facility] & does better on no Rx she says "there is something wrong w/ me, I'm losing so much weight, I'm skin & bones" wt today= 156# which is up 8# from last visit; she is c/o snorting phlegm that she says tastes bitter & has little white balls in it (she could not produce any sput despite a lot of hocking in the office today (advised to  continue Mucinex600mg -2Bid w/ fluids)......  Also c/o left lat CWP, tender ribs & wears lumbar brace/ rib binder, +takes Hydrocodone/ Tramadol as needed...     We  reviewed prob list, meds, xrays and labs> see below for updates >> she declines the 2013 flu vaccine... LABS 10/13:  Chems-wnl;  CBC- wnl;  TSH= 1.79          Problem List:  RESPIRATORY SYMPTOMS >> c/o "hocking phlegm" DYSPNEA (ICD-786.9) - eval 5/11 c/o SOB- can't get a DB, can't get enough air in, etc... occurs at rest & w/ exerc "I'm very active despite my arthritis", talks rapidly in long sentences w/o apparent distress... no cough, phlegm, hemoptysis, etc- but as usual she is quite distressed by these symptoms and wants further eval> we discussed re-assessment w/ CXR (clear, NAD);  PFT (totally norm airflow);  Labs (all WNL);  Rx w/ ALPRAZOLAM 0.5mg  Tid & encouraged to take regularly. ~  CXR 4/13 showed norm heart size, clear lungs, mild TSpine spondylosis... ~  She continues to complain of drainage, throat symptoms, etc; Rx w/ Mucinex, OTC antihist, Nasonex, etc;  Seen by ENT, DrRosen w/ ?xerostomia & rec incr water etc. ~  9/13: Add-on visit for "hocking, it's not a cough, it's a hock"> see above... ~  9/13: CXR shows sl overinflation, clear lungs, normal heart size w/ elongation & tortuous Ao;  PFTs are wnl> FVC=2.62 (128%), FEV1=2.20 (158%), %1sec=84; mid-flows=274% of predicted... We discussed trial rx for her symptoms- Mucinex, Fluids, Hycodan, Dulera100- 1puffbid. ~  10/13: she reports INTOL to [facility] w/ throat & mouth symptoms "like glue" therefore stopped this med; advised to continue the others.  HYPERTENSION (ICD-401.9) - on AMLODIPINE 2.5mg /d,  COREG 3.125mg Bid,  LASIX 40mg - taking 1/2tab/d because 1 tab makes her too weak...  ~  7/12:  C/o incr edema in legs & advised to elim sodium from diet & incr her LASIX to 40mg /d... ~  10/12:  Prev Cozaar 50mg  was stopped 10/12 via the ER when she presented w/ ?angioedema of the tongue... ~  2/13:  BP= 120/68 & doing well on 3 med regimen & off Losartan. ~  5/13:  BP= 110/60 & she has mult somatic complaints but denies HA, CP, palpit, ch in  SOB or edema... ~  8/13:  BP= 112/58 & there is no change in her mult somatic complaints... ~  9/13:  BP= 100/60 on Coreg3.125Bid & Amlod2.5 & prn Lasix... ~  10/13:  BP= 120/58 & she has mult somatic complaints as before...  Hx of CHEST PAIN (ICD-786.50) - she takes ASA 81mg /d + Tylenol Prn... followed by DrBensimhon for Cards. ~  Baseline EKG w/ RBBB & LAD, no acute changes... ~  NuclearStressTest done 2007 & 2009 showed normal- without scar or ischemia, EF=67%...   ~  2DEcho 7/08 showed mild AI, mild diastolic dysfunction, EF=55-60%. ~  CT Abd 1/09 & 9/12 showed incidental coronary calcif, & aorto-iliac atherosclerotic changes as well... ~  8/13: she saw DrNishan> HBP, DD, atypCP w/ neg Myoviews & no changes made... ~  10/13:  She uses a back brace/ abd binder to help her CWP...  VENOUS INSUFFICIENCY (ICD-459.81) - she has mod VI and follows a low sodium diet, elevates legs when able, and wears support hose. ~  7/12:  C/o incr edema & we reviewed a 2gm sodium diet & increased her Lasix from 20mg /d to 40mg /d... ~  5/13:  She has persist complaints but mild chr VI w/ min edema & reminded to elim sodium,  elev, support hose, take the Lasix...  HIATAL HERNIA (ICD-553.3) & GASTRITIS (ICD-535.50) - on OMEPRAZOLE 20mg /d & followed by DrJEdwards for GI having fired DrStark & [facility] in past. ~  last EGD was 2/08 w/ 3cm HH & gastitis... ~  CT Abd 1/09 revealed calcif gallstones, & mild ectasia of AA... ~  CT Abd 1/11 showed left colon divertics, tiny hepatic lesions w/o change & right renal cyst stable. ~  CT Abd 9/12 showed divertics (no inflamm), right renal & left hep lobe cysts, atherosclerotic changes, NAD... ~  Seen by DrEdwards 4/13> c/o dysphagia & congestion; he notes mult GI complaints, known esoph dysmotility w/o stricture, hard to pin down her complaints...  DIVERTICULOSIS, COLON (ICD-562.10) - followed regularly by DrStark. ~  colonoscopy 7/03 by DrStark showing divertics only... we  discussed Miralax + Senakot-S for constipation. ~  colonoscopy 2/11 showed divertics, 3mm polyp, hems...  ? of ABDOMINAL PAIN & SYMPTOMATOLOGY - she had a very thorough work up in 2009> she has a cancer phobia and is very difficult to reassure her that everything appropriate has been done & no cancer found... Mult additional GI evals since then from [facility], [facility] (DrEdwards), [facility] (DrThorne)... ~  eval by GI- DrStark 12/08 but pt wasn't satisfied w/ his examination... ~  full labs 1/09 were WNL, sed=20... ~  CT Abd/ Pelvis 1/09= no acute abn in abd (calcif gallstones & mild ectasia of AA), calcif fibroid in the pelvis, & ?regarding the left ovary. ~  referral to DrFontaine, GYN> his notes are reviewed... ~  follow up w/ DrKimbrough, Urology> his notes are reviewed... ~  eval DrWhitfield for Orthopedics- rec shots in back by [facility] w/ some improvement. ~  repeat GI eval 1/11 by DrStark w/ CT & colonoscpy as above- no acute problems. ~  5/11 & 7/11 >> persist complaints w/ neg recheck by DrStark & DrKimbrough... ~  12/11:  routine GYN surveillance DrFontaine found anteverted uterus w/ fibroids, atrophic ovaries bilat, no mass. ~  Repeat GI eval 4/12 by DrStark> he tried to reassure the pt, felt some symptoms might be from poor abd wall muscle tone, rec continue Omep, laxatives... ~  6/12:  She sought another GI opinion DrEdwards> he did not feel she needed any additional testing... ~  She tells me she has yet another appt to see a gastroenterologist at [facility]==> seen by DrThorne 8/12 & note reviewed... ~  EGD by DrThorne 9/12 w/ mult whitish nodular plaques in cardia & fundus> Bx results pending & she is very concerned (remember her hx cancer phobia). ~  She continues to f/u w/ DrThorne at [facility] & is happy w/ her eval...  RENAL CYST (ICD-593.2) - she has a 1 cm right renal cyst seen on CT in 2007 and followed by DrKimbrough... f/u CT Abd 1/09 - no change in the cyst... f/u renal ultrasound  1/10 w/ small bilat cysts... f/u CT Abd 1/11- no change in cyst. ~  she saw DrKimbrough 7/11 for LBP, microhematuria, urge incontinence- stable, no additional Rx. ~  she saw DrKimbrough 1/12 for urge incontinence, freq, nocturia> rec Desmopressin 0.2mg - 1/2 tab Qhs.  CARCINOMA, BREAST, LEFT (ICD-174.9) - Surgery 9/94 by DrBlievernicht w/ Left modified radical mastectomy... +estrogen receptors and Rx w/ Tamoxifen... f/u mammograms all neg... breast exam on right has been neg- no nodules palpated... She says that DrBertrand insisted that her PCP check her right breast at each & every office visit to be sure she doesn't develop any nodules.Marland KitchenMarland Kitchen  DEGENERATIVE JOINT DISEASE (ICD-715.90) - on OTC meds + VICODIN Prn... she has end-stage OA of Right Knee per DrWhitfield treated w/ shots in the past... she has a knee brace and a cane for ambulation... ~  8/13:  She wants a better pain pill & we discussed change to [facility] 5/325 Tid prn...  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 Rx w/ rest, heat, Percocet, and f/u w/ Ortho...  Hx of LOW BACK PAIN, CHRONIC (ICD-724.2) - eval DrWhitfield w/ rec for shots by DrNewton & she's feeling sl better & wears back brace... ~  5/10: mult somatic complaints- primarily c/o left side/ postero-lat rib pain...  Exam w/ tender left lat/ post ribs... Bone Scan= neg... ~  MRI Lumbar spine 7/10 by DrWhitfield... ~  Epidural steroid shot by DrNewton 8/10 & 1/11 ~  Repeat LBP eval by DrWhitfield w/ another MRI lumbar spine showing degenerative changes w/ mild progression since 7/10 MRI, mild-mod sp stenosis & lat recess stenosis at several levels ~  DrWhitfield referred her to [facility] to consider more shots but she is not so inclined... ~  6/13: she saw DrTooke w/ lumbar spondylosis; he felt that he had nothing to offer her & rec incr Tramadol to Qid...  CARPAL TUNNEL SYNDROME, BILATERAL (ICD-354.0) -  she had prev right carpal tunnel release by DrWhitfield and was c/o increasing discomfort in her left wrist despite wrist splint Qhs- had left carpal tunnel release 1/10 by DrWhitfield...  ANXIETY (ICD-300.00) - on ALPRAZOLAM 0.5mg  Tid & encouraged to take it regularly... she has a cancer phobia & it is very difficult to reassure her regarding her symptoms and our evaluations... ~  7/13: she went to the ER c/o swelling of the tongue but eval by EDP indicated that her tongue was normal & not swollen, she was not on an ACE, given Pred Rx anyway.   Past Surgical History  Procedure Date  . Left mastectomy   . Carpal tunnel release     Outpatient Encounter Prescriptions as of 08/30/2012  Medication Sig Dispense Refill  . ALPRAZolam (XANAX) 0.5 MG tablet Take 0.5 mg by mouth 3 (three) times daily as needed. For anxiety      . amLODipine (NORVASC) 2.5 MG tablet Take 2.5 mg by mouth daily. For blood pressure      . aspirin EC 81 MG tablet Take 81 mg by mouth daily.      . carvedilol (COREG) 3.125 MG tablet Take 3.125 mg by mouth 2 (two) times daily with a meal. For blood pressure      . Cholecalciferol (VITAMIN D3) 1000 UNITS CAPS Take 1 capsule by mouth daily. For your bones      . furosemide (LASIX) 40 MG tablet Take 40 mg by mouth daily. For swelling      . guaiFENesin (MUCINEX) 600 MG 12 hr tablet Take 1,200 mg by mouth 2 (two) times daily. For congestion      . HYDROcodone-acetaminophen (NORCO) 5-325 MG per tablet Take 1/2 to 1 tablet by mouth three times daily as needed for pain  90 tablet  5  . HYDROcodone-homatropine (HYCODAN) 5-1.5 MG/5ML syrup Take 1 teaspoon by mouth four times a day for cough      . Multiple Vitamins-Minerals (MULTIVITAMINS THER. W/MINERALS) TABS Take 1 tablet by mouth daily.      Marland Kitchen omeprazole (PRILOSEC) 20 MG capsule Take 40 mg by mouth daily. For her stomach      .  polyethylene glycol (MIRALAX / GLYCOLAX) packet Take 17 g by mouth daily as needed. For constipation        . PRESCRIPTION MEDICATION Place 1 drop into both eyes daily. Eye drops (samples were given to patient by Dr. Dione Booze)      . sennosides-docusate sodium (SENOKOT-S) 8.6-50 MG tablet Take 1 tablet by mouth daily as needed. For constipation      . traMADol (ULTRAM) 50 MG tablet Take 50 mg by mouth every 6 (six) hours as needed. As needed for pain      . DISCONTD: amLODipine (NORVASC) 2.5 MG tablet Take 1 tablet (2.5 mg total) by mouth daily.  30 tablet  3  . DISCONTD: carvedilol (COREG) 3.125 MG tablet Take 1 tablet (3.125 mg total) by mouth 2 (two) times daily with a meal.  60 tablet  11  . DISCONTD: HYDROcodone-homatropine (HYCODAN) 5-1.5 MG/5ML syrup Take 1 teaspoon by mouth four times a day.  240 mL  0  . DISCONTD: omeprazole (PRILOSEC) 20 MG capsule Take 2 capsules (40 mg total) by mouth daily.  60 capsule  5  . DISCONTD: azithromycin (ZITHROMAX) 250 MG tablet Take as dircted  6 tablet  0  . DISCONTD: mometasone-formoterol (DULERA) 100-5 MCG/ACT AERO Inhale 1 puff into the lungs 2 (two) times daily.  1 Inhaler  5    Allergies  Allergen Reactions  . Fentanyl     REACTION: nausea    Current Medications, Allergies, Past Medical History, Past Surgical History, Family History, and Social History were reviewed in [employer] record.    Review of Systems      See HPI - all other systems neg except as noted... She has mult somatic complaints and anxiety. The patient complains of decreased hearing, chest discomfort, dyspnea on exertion, abdominal pain, incontinence, muscle weakness, and difficulty walking.  The patient denies anorexia, fever, vision loss, hoarseness, syncope, peripheral edema, prolonged cough, headaches, hemoptysis, melena, hematochezia, severe indigestion/heartburn, hematuria, transient blindness, depression, abnormal bleeding, enlarged lymph nodes, and angioedema.     Objective:   Physical Exam   WD, WN, 78 y/o BF in NAD... she is very  anxious... GENERAL:  Alert & oriented; pleasant & cooperative... HEENT:  [place]/AT, EOM-wnl, PERRLA, EACs-clear, TMs-wnl, NOSE-clear, THROAT-clear & wnl, no lesions seen. NECK:  Neck ROM decreased, no JVD; normal carotid impulses w/o bruits; no thyromegaly or nodules palpated; no lymphadenopathy. CHEST:  Clear to P & A; without wheezes/ rales/ or rhonchi heard... + tender right lat 11th & 12th ribs on palp. BREAST:  s/p left mastectomy... right breast normal- without nodules palpated... HEART:  Regular Rhythm; without murmurs/ rubs/ or gallops detected... ABDOMEN:  Soft & nontender; normal bowel sounds; no organomegaly or masses palpated... EXT: without deformities, mod arthritic changes; no varicose veins/ +venous insuffic/ tr edema. NEURO:  CN's intact; no focal neuro deficits... DERM:  Small sl excoriated area of dermatitis left shin- no drainage,no rash, etc...  RADIOLOGY DATA:  Reviewed in the EPIC EMR & discussed w/ the patient...  LABORATORY DATA:  Reviewed in the EPIC EMR & discussed w/ the patient...   Assessment & Plan:    RESPIRATORY SYMPTOMS w/ Dyspnea & throat clearing>  She had a neg pulm eval 2011 as above; she insisted on additional eval 9/13 for her chr throat clearing symptom; CXR w/o acute changes & PFTs normal for her 78 y/o lungs; she is quite insistent on med rx for her problem & we reviewed Mucinex, Hycodan, Dulera, & Listerine (she was  intol to the [facility]); pt very anxious & Alprazolam seems to help when she takes it...  HBP>  Controlled on low doses of several meds including Coreg, Norvasc & Lasix;  She is encouraged to take meds regularly...  Hx CP> evaluated for Cards by DrBensimon & he has not been in favor of invasive testing; stable on conservative med rx...  VI/ Edema>  She is concerned about incr edema & we reviewed 2gm sodium diet & rec keep Lasix at 40mg /d...  GI> HH, Gastritis, Divertics, vague abd pain>  I tried once again to reassure her about the thorough  GI evaluations that her has undergone; she is happy w/ DrThorne's eval & will f/u w/ her & in the meanwhile continue rx w/ Prilosec40, Miralax, Metamucil, Senakot, Mylicon, etc...  Renal cyst>  This is benign & has been followed by DrKimbrough...  Hx left Breast Cancer>  No known recurrence...  DJD> Cervical spondylosis & chronic LBP>  She has been evaluated by DrWhitfield for Ortho & given epid steroid shots by DrNewton... She wants a better pain pill & we'll try NORCO 5/325 tid as needed...  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  Anxiety>  Asked once again to increase the Alprazolam to 1/2 - 1 tab TID regularly...   Patient's Medications  New Prescriptions   No medications on file  Previous Medications   ALPRAZOLAM (XANAX) 0.5 MG TABLET    Take 0.5 mg by mouth 3 (three) times daily as needed. For anxiety   ASPIRIN EC 81 MG TABLET    Take 81 mg by mouth daily.   CHOLECALCIFEROL (VITAMIN D3) 1000 UNITS CAPS    Take 1 capsule by mouth daily. For your bones   FUROSEMIDE (LASIX) 40 MG TABLET    Take 40 mg by mouth daily. For swelling   GUAIFENESIN (MUCINEX) 600 MG 12 HR TABLET    Take 1,200 mg by mouth 2 (two) times daily. For congestion   HYDROCODONE-ACETAMINOPHEN (NORCO) 5-325 MG PER TABLET    Take 1/2 to 1 tablet by mouth three times daily as needed for pain   MULTIPLE VITAMINS-MINERALS (MULTIVITAMINS THER. W/MINERALS) TABS    Take 1 tablet by mouth daily.   POLYETHYLENE GLYCOL (MIRALAX / GLYCOLAX) PACKET    Take 17 g by mouth daily as needed. For constipation   PRESCRIPTION MEDICATION    Place 1 drop into both eyes daily. Eye drops (samples were given to patient by Dr. Dione Booze)   SENNOSIDES-DOCUSATE SODIUM (SENOKOT-S) 8.6-50 MG TABLET    Take 1 tablet by mouth daily as needed. For constipation   TRAMADOL (ULTRAM) 50 MG TABLET    Take 50 mg by mouth every 6 (six) hours as needed. As needed for pain  Modified Medications   Modified  Medication Previous Medication   AMLODIPINE (NORVASC) 2.5 MG TABLET amLODipine (NORVASC) 2.5 MG tablet      Take 2.5 mg by mouth daily. For blood pressure    Take 1 tablet (2.5 mg total) by mouth daily.   CARVEDILOL (COREG) 3.125 MG TABLET carvedilol (COREG) 3.125 MG tablet      Take 3.125 mg by mouth 2 (two) times daily with a meal. For blood pressure    Take 1 tablet (3.125 mg total) by mouth 2 (two) times daily with a meal.   HYDROCODONE-HOMATROPINE (HYCODAN) 5-1.5 MG/5ML SYRUP HYDROcodone-homatropine (HYCODAN) 5-1.5 MG/5ML syrup      Take 1 teaspoon by mouth four times  a day for cough    Take 1 teaspoon by mouth four times a day.   OMEPRAZOLE (PRILOSEC) 20 MG CAPSULE omeprazole (PRILOSEC) 20 MG capsule      Take 40 mg by mouth daily. For her stomach    Take 2 capsules (40 mg total) by mouth daily.  Discontinued Medications   AZITHROMYCIN (ZITHROMAX) 250 MG TABLET    Take as dircted   MOMETASONE-FORMOTEROL (DULERA) 100-5 MCG/ACT AERO    Inhale 1 puff into the lungs 2 (two) times daily.

## 2012-08-31 ENCOUNTER — Telehealth: Payer: Self-pay | Admitting: Pulmonary Disease

## 2012-08-31 ENCOUNTER — Ambulatory Visit: Payer: Medicare Other | Admitting: Pulmonary Disease

## 2012-08-31 LAB — TSH: TSH: 1.79 u[IU]/mL (ref 0.35–5.50)

## 2012-08-31 NOTE — Telephone Encounter (Signed)
I spoke with pt and she stated she the phlebotomist had a hard time drawing her blood yesterday, she has a knot on her arm and her write is swollen where they tried to draw her blood. She was stuck 3 times per pt. She wanted to know if they were able to get enough blood for ehr lab work. I do see results in [facility]. I advised pt will call once SN reviews labs. Please advise thanks  --labs printed off as well

## 2012-09-01 NOTE — Telephone Encounter (Signed)
Called and spoke with pt and she is aware of her lab results per SN.  Nothing further is needed. 

## 2012-09-02 ENCOUNTER — Other Ambulatory Visit: Payer: Self-pay

## 2012-09-02 MED ORDER — FUROSEMIDE 40 MG PO TABS: 40.0000 mg | ORAL_TABLET | Freq: Every day | ORAL | Status: DC

## 2012-09-14 ENCOUNTER — Telehealth: Payer: Self-pay | Admitting: Pulmonary Disease

## 2012-09-14 NOTE — Telephone Encounter (Signed)
Per SN---to follow up with ENT.  Pt can call for the appt or we can schedule this for her.  thanks

## 2012-09-14 NOTE — Telephone Encounter (Signed)
Called, spoke with pt:  1.  Would like Dr. Kriste Basque to know her right leg is still swollen from "ankle up" and is sore to touch.  2.  States her right ear "hurts so bad that my head feels funny."  States it is a nagging pain and has difficulty hearing out of this ear.  She wants to see ENT that she seen previously for this problem but was unsure who this was.  Advised our records show we have referred her to Dr. Pollyann Kennedy in the past.  I gave her Dr. Lucky Rathke office number.  She will call to schedule appt.  She would like to know if Dr. Kriste Basque has any recs for this in the meantime.  Pls advise.  Thank you.  Allergies  Allergen Reactions  . Fentanyl     REACTION: nausea

## 2012-09-14 NOTE — Telephone Encounter (Signed)
Called, spoke with Heather [name].  Informed SN recs she follow up with ENT.  Heather [name] states she did schedule appt with Dr. Pollyann Kennedy for Nov 5 at 2:40.  She will call back if anything further is needed.

## 2012-09-15 ENCOUNTER — Telehealth: Payer: Self-pay | Admitting: Pulmonary Disease

## 2012-09-15 NOTE — Telephone Encounter (Signed)
Pt called stating that right leg is swollen and sore to touch from ankle up to knee.  Pt reports that this has worsened since ov with Dr Kriste Basque  2 weeks ago.  Pt is taking lasix 40 mg daily and is reports that she is maintaining a low salt diet.  Pt given appt with Tammy Parrett 09-16-12 at 11:45.  Pt instructed to go to ED if symptoms worsen.

## 2012-09-16 ENCOUNTER — Ambulatory Visit: Payer: Medicare Other | Admitting: Adult Health

## 2012-09-17 ENCOUNTER — Emergency Department (HOSPITAL_COMMUNITY)
Admission: EM | Admit: 2012-09-17 | Discharge: 2012-09-17 | Disposition: A | Discharge status: Home / Self Care | Payer: Medicare Other | Attending: Emergency Medicine | Admitting: Emergency Medicine

## 2012-09-17 DIAGNOSIS — M7989 Other specified soft tissue disorders: Secondary | ICD-10-CM

## 2012-09-17 DIAGNOSIS — R609 Edema, unspecified: Secondary | ICD-10-CM

## 2012-09-17 DIAGNOSIS — I10 Essential (primary) hypertension: Secondary | ICD-10-CM | POA: Insufficient documentation

## 2012-09-17 DIAGNOSIS — Z7982 Long term (current) use of aspirin: Secondary | ICD-10-CM | POA: Insufficient documentation

## 2012-09-17 DIAGNOSIS — Z79899 Other long term (current) drug therapy: Secondary | ICD-10-CM | POA: Insufficient documentation

## 2012-09-17 DIAGNOSIS — Z87448 Personal history of other diseases of urinary system: Secondary | ICD-10-CM | POA: Insufficient documentation

## 2012-09-17 DIAGNOSIS — M545 Low back pain, unspecified: Secondary | ICD-10-CM | POA: Insufficient documentation

## 2012-09-17 DIAGNOSIS — F411 Generalized anxiety disorder: Secondary | ICD-10-CM | POA: Insufficient documentation

## 2012-09-17 DIAGNOSIS — G5600 Carpal tunnel syndrome, unspecified upper limb: Secondary | ICD-10-CM | POA: Insufficient documentation

## 2012-09-17 DIAGNOSIS — Z8719 Personal history of other diseases of the digestive system: Secondary | ICD-10-CM | POA: Insufficient documentation

## 2012-09-17 DIAGNOSIS — M1990 Unspecified osteoarthritis, unspecified site: Secondary | ICD-10-CM | POA: Insufficient documentation

## 2012-09-17 DIAGNOSIS — K2940 Chronic atrophic gastritis without bleeding: Secondary | ICD-10-CM | POA: Insufficient documentation

## 2012-09-17 DIAGNOSIS — Z853 Personal history of malignant neoplasm of breast: Secondary | ICD-10-CM | POA: Insufficient documentation

## 2012-09-17 MED ORDER — OXYCODONE-ACETAMINOPHEN 5-325 MG PO TABS
1.0000 | ORAL_TABLET | Freq: Once | ORAL | Status: AC
  Administered 2012-09-17: 1 via ORAL
  Filled 2012-09-17: qty 1

## 2012-09-17 NOTE — ED Notes (Signed)
[id number]<AV> Expected date:<BR> Expected time:<BR> Means of arrival:<BR> Comments:<BR> Hold for triage 4

## 2012-09-17 NOTE — Progress Notes (Signed)
*  PRELIMINARY RESULTS* Vascular Ultrasound Right lower extremity venous duplex has been completed.  Preliminary findings: Right:  No evidence of DVT, superficial thrombosis, or Baker's cyst.   Farrel Demark, RDMS, RVT 09/17/2012, 5:24 PM

## 2012-09-17 NOTE — ED Notes (Signed)
RT leg swelling x 2 months.  Pt of Dr. Kriste Basque who has sent her here for evaluation. States it is sore to touch but denies chest pain or short of breath.  States she had been making phlegm that she has to "hock up" but doesn't otherwise doesn't feel congested.

## 2012-09-17 NOTE — ED Provider Notes (Signed)
History    89yf with RLE swelling. Onset over 2 months ago. Denies trauma. Symptoms constant and stable. No fever or chills. No rash. No CP or SOB. Denies hx of blood clot. No n/v. No numbness or tingling. Has been evaluated by PCP for same.  CSN: [id number]  Arrival date & time 09/17/12  1419   First MD Initiated Contact with Patient 09/17/12 1555      Chief Complaint  Patient presents with  . Leg Swelling    RT leg    (Consider location/radiation/quality/duration/timing/severity/associated sxs/prior treatment) HPI  Past Medical History  Diagnosis Date  . RBBB (right bundle branch block with left anterior fascicular block)   . Hypertension   . Hiatal hernia   . Gastritis, chronic   . Breast cancer   . Degenerative joint disease   . Anxiety state, unspecified   . Low back pain   . Carpal tunnel syndrome   . Renal cyst   . Diverticula of colon   . Chest pain   . Cervical spondylosis   . Diastolic dysfunction   . Venous insufficiency   . Constipation     Past Surgical History  Procedure Date  . Left mastectomy   . Carpal tunnel release     Family History  Problem Relation Age of Onset  . Colon cancer Neg Hx     History  Substance Use Topics  . Smoking status: Never Smoker   . Smokeless tobacco: Never Used  . Alcohol Use: No    OB History    Grav Para Term Preterm Abortions TAB SAB Ect Mult Living                  Review of Systems   Review of symptoms negative unless otherwise noted in HPI.   Allergies  Fentanyl  Home Medications   Current Outpatient Rx  Name Route Sig Dispense Refill  . ALPRAZOLAM 0.5 MG PO TABS Oral Take 0.5 mg by mouth 3 (three) times daily as needed. For anxiety    . AMLODIPINE BESYLATE 2.5 MG PO TABS Oral Take 2.5 mg by mouth daily. For blood pressure    . ASPIRIN EC 81 MG PO TBEC Oral Take 81 mg by mouth daily.    Marland Kitchen CARVEDILOL 3.125 MG PO TABS Oral Take 3.125 mg by mouth 2 (two) times daily with a meal. For blood  pressure    . VITAMIN D3 1000 UNITS PO CAPS Oral Take 1 capsule by mouth daily. For your bones    . FUROSEMIDE 40 MG PO TABS Oral Take 1 tablet (40 mg total) by mouth daily. For swelling 30 tablet 1  . GUAIFENESIN ER 600 MG PO TB12 Oral Take 1,200 mg by mouth 2 (two) times daily. For congestion    . HYDROCODONE-ACETAMINOPHEN 5-325 MG PO TABS  Take 1/2 to 1 tablet by mouth three times daily as needed for pain 90 tablet 5  . THERA M PLUS PO TABS Oral Take 1 tablet by mouth daily.    Marland Kitchen OMEPRAZOLE 20 MG PO CPDR Oral Take 40 mg by mouth daily. For her stomach    . POLYETHYLENE GLYCOL 3350 PO PACK Oral Take 17 g by mouth daily as needed. For constipation    . PRESCRIPTION MEDICATION Both Eyes Place 1 drop into both eyes daily. Eye drops (samples were given to patient by Dr. Dione Booze)    . SENNA-DOCUSATE SODIUM 8.6-50 MG PO TABS Oral Take 1 tablet by mouth daily as needed. For constipation    .  TRAMADOL HCL 50 MG PO TABS Oral Take 50 mg by mouth every 6 (six) hours as needed. As needed for pain      BP 141/70  Temp 98.3 F (36.8 C) (Oral)  Resp 18  SpO2 100%  Physical Exam  Nursing note and vitals reviewed. Constitutional: She appears well-developed and well-nourished. No distress.  HENT:  Head: Normocephalic and atraumatic.  Eyes: Conjunctivae normal are normal. Right eye exhibits no discharge. Left eye exhibits no discharge.  Neck: Neck supple.  Cardiovascular: Normal rate, regular rhythm and normal heart sounds.  Exam reveals no gallop and no friction rub.   No murmur heard. Pulmonary/Chest: Effort normal and breath sounds normal. No respiratory distress.  Abdominal: Soft. She exhibits no distension. There is no tenderness.  Musculoskeletal: She exhibits no edema and no tenderness.       RLE edema. No calf tenderness. No concerning skin lesions.   Neurological: She is alert.  Skin: Skin is warm and dry.  Psychiatric: She has a normal mood and affect. Her behavior is normal. Thought content  normal.       Suprisingly alert and high degree of function for age.     ED Course  Procedures (including critical care time)  Labs Reviewed - No data to display No results found.   1. Edema       MDM  89yF with RLE swelling for past 2 months. Labs 2w ago which consistent with normal renal/liver function.  Not consistent with infectious process. [place] negative for DVT. Etiology unclear but does not appear to be emergent process. Feel safe for DC. Outpt fu.         Raeford Razor, MD 09/21/12 [id number]

## 2012-09-17 NOTE — ED Notes (Signed)
US at bedside

## 2012-09-21 ENCOUNTER — Other Ambulatory Visit: Payer: Self-pay | Admitting: Otolaryngology

## 2012-09-21 DIAGNOSIS — R1310 Dysphagia, unspecified: Secondary | ICD-10-CM

## 2012-09-24 ENCOUNTER — Encounter: Payer: Self-pay | Admitting: *Deleted

## 2012-09-27 ENCOUNTER — Encounter: Payer: Self-pay | Admitting: Pulmonary Disease

## 2012-09-27 ENCOUNTER — Ambulatory Visit (INDEPENDENT_AMBULATORY_CARE_PROVIDER_SITE_OTHER): Payer: Medicare Other | Admitting: Pulmonary Disease

## 2012-09-27 VITALS — BP 120/64 | HR 72 | Temp 98.6°F | Ht 60.0 in | Wt 152.8 lb

## 2012-09-27 DIAGNOSIS — I872 Venous insufficiency (chronic) (peripheral): Secondary | ICD-10-CM

## 2012-09-27 DIAGNOSIS — M1990 Unspecified osteoarthritis, unspecified site: Secondary | ICD-10-CM

## 2012-09-27 DIAGNOSIS — R109 Unspecified abdominal pain: Secondary | ICD-10-CM

## 2012-09-27 DIAGNOSIS — K5730 Diverticulosis of large intestine without perforation or abscess without bleeding: Secondary | ICD-10-CM

## 2012-09-27 DIAGNOSIS — F411 Generalized anxiety disorder: Secondary | ICD-10-CM

## 2012-09-27 DIAGNOSIS — R609 Edema, unspecified: Secondary | ICD-10-CM

## 2012-09-27 DIAGNOSIS — K5900 Constipation, unspecified: Secondary | ICD-10-CM

## 2012-09-27 DIAGNOSIS — K219 Gastro-esophageal reflux disease without esophagitis: Secondary | ICD-10-CM

## 2012-09-27 DIAGNOSIS — K449 Diaphragmatic hernia without obstruction or gangrene: Secondary | ICD-10-CM

## 2012-09-27 DIAGNOSIS — I10 Essential (primary) hypertension: Secondary | ICD-10-CM

## 2012-09-27 DIAGNOSIS — Z853 Personal history of malignant neoplasm of breast: Secondary | ICD-10-CM

## 2012-09-27 MED ORDER — INFLUENZA VIRUS VACC SPLIT PF IM SUSP: 0.5000 mL | Freq: Once | INTRAMUSCULAR | Status: DC

## 2012-09-27 NOTE — Progress Notes (Signed)
Subjective:    Patient ID: Heather [name], female    DOB: 1918/04/17, 78 y.o.   MRN: [id number]  HPI 78 y/o BF here for a follow up visit... she has multiple medical problems including:  Dyspnea & Anxiety;  HBP;  Hx CP followed by DrBensimhon;  HH/ Gastritis;  Divertics/ Constip;  Functional Abd Pain;  Renal cyst followed by DrKimbrough;  Hx left breast [phone];  DJD/ CSpine spondy/ LBP/ CTS; and she has a cancer phobia...  ~  December 26, 2010:  add-on appt due to right side pain x4d she says> "I feel fine, BUT..." states the right post CWP is unbearable, sharp, worse w/ movement, & denies trauma... area over the right 11-12th ribs is tender to palp & reproduces her discomfort;  chest is clear & no rubs w/ O2 sat 98% RA;  last CXR 9/11 clear, some scarring, ectatic Ao, DJD sp;  she wants to relate this all to her prev complaints of Abd pain & swelling, left flank pain, etc (recall extensive evals w/ nothing signif found to explain her varied symptoms)...  we discussed rx w/ rest, heat, rib binder, Lidoderm patches, Vicodin Prn.  ~  February 10, 2011:  6wk ROV- she saw TP 3/16 w/ cough & mult somatic complaints trouble swallowing, throat closing, drainage, nasal congestion, "harking phlegm" etc;  CXR= stable, ectatic Ao, NAD;  Labs include BMet=wnl, BNP=110, UA=clear;  Rx w/ Zyrtek, Saline, Nasonex, Pepcid, Mucinex, & keep appt w/ DrStark for swallowing eval... Now c/o same constellation of symptoms more or less, never took the Zyrtek or Nasonex (too$$) & we discussed regimen w/ Zyrtek AM, Saline/ Mucinex during the day/ Flonase Qhs...    Dyspnea>  On Mucinex plus her anxiolytics (Alprazolam) but not taking regularly...    HBP>  On Coreg, Norvasc, Cozaar, Lasix & BP= 110/64 & well controlled, she thinks too many meds...    GI>  She has hx HH, gastritis, divertics, constip, & functional abd pain w/ mult extensive evaluations in the past;  On Omep20, Miralax, Senakot-S but she persists w/ c/o swelling &  "something wrong in my stomach it's getting bigger & bigger- I might have cancer"; last CT Abd 1/11 reviewed- divertics, otherw neg;  States her appetite is OK but weight down 12#;  C/o left side of abd hurts when she gets up to walk, no pain sitting or supine;  Offered additional meds (try Levsin or bentyl but she declines);  She has up coming appt w/ DrStark.    DJD>  Followed by DrWhitfield & she's had epid shots by DrNewton in the past    Anxiety>  On Alprazolam but she won't take it regularly for help w/ her severe anxiety & cancer phobia...  ~  June 12, 2011:  1mo ROV & she persists w/ mult somatic complaints & flight of ideas hx, "I have really suffered" she says...    GI> Hx HH, gastritis, divertics, & vague abd pains> she is not happy w/ DrStark's eval & sought second opinion (DrMann declined to see her); she saw DrJEdwards & his notes are reviewed;  Today she is quite distraught about papers to fill out for the GI dept at [facility]- DrNThorne...    Ortho> c/o pain in left leg w/ some edema (she has VI & dependent edema) & states her legs feel like lead; exam shows some incr in LE edema & we discussed incr in her Lasix from 20mg  to 40mg /d plus a 2gm sodium diet.Marland KitchenMarland Kitchen  We rechecked her labs today> all WNL.Marland Kitchen.  ~  July 09, 2011:  65mo ROV & add-on due to sm area of dermatitis on her left leg, sl excoriated & sm scab==> her son is here today & told me she drove him crazy w/ her ruminations about this being a cancer & that we would send her to the cancer center, etc (she got angry at him for telling me); we discussed dermatitis & rec RX w/ LIDEX-E cream to apply Bid & rec Derm review if not resolved...    She is also quite concerned about a "bump" on the back of her head==> occipital bone & it's symmetric, she is reassured...    She has been to [facility] GI Dept DrThorne & note is pending but she has lab sheet (all wnl) & is sch for EGD 9/12; they told her to stop Metamucil, and take Miralax, Senakot; she is most  concerned/ upset by an evaluation form she received asking questions like how long she waited for this or that during her visit & she doesn't know what to do or how to complete the info! I encouraged her to take her Alpraz regularly Tid...  ~  September 22, 2011:  2.95mo ROV & she states "terrible" c/o phlegm, swelling, feeling bad, wt loss, "this throat business- they found white balls"; very anxious w/ mult somatic complaints >>    HBP> on Norvasc2.5, Coreg3.125Bid, Losartan50, Lasix20-40/d; she went to the ER w/ ?angioedema of the tongue, seen by [facility] who called me & we decided to stop her Cozaar; BP today off Cozaar & on the other 3 meds showed 122/72> rec to continue the same...    GI> she saw DrThorne at [facility] 9/12 for a comprehensive GI eval> note reviewed- they did an EGD 08/06/11 w/ mult white nodular plaques seen in cardia & fundus- Bx'd & pending (she is very concerned);  she went to the ER 08/16/11 w/ abd discomfort- the same vague left side abd discomfort that she has seen mult physicians about- Exam showed mild diffuse tenderness;  they did CT Abd showing divertics, no inflamm, right renal & left hep lobe cysts, atherosclerotic changes, NAD;  Labs & urine were essentially WNL;  EKG showed NSR, RBBB & LAD, no acute changes...  she saw DrEdwards [facility]) 10/12 w/ mult complaints including "a knot on her skull & she feels that her brains are coming out"; he thought she was doing reasonably well from the GI standpoint w/ her dysphagia, vague abd pain, & constip...    Ortho> she saw DrWhitfield w/ MRI Lumbar spine 10/12 showing degenerative changes w/ mild progression since 7/10 MRI, mild-mod sp stenosis & lat recess stenosis at several levels; she has Vicodin to take up to 3/d as needed for pain; she is anxiously awaiting f/u visit w/ DrWhitfield...    Derm> DrLupton saw pt 07/23/11 w/ removal of sm squamous cell ca (in situ) from left lower leg  ~  December 29, 2011:  55mo ROV & Heather [name] has her  usual complement of somatic complaints> all seem stable w/o appreciable change; she does not want to see DrJEdwards again, but is happy w/ eval & communication from DrThorne at [facility] says her abd pain is coming from the arthritis in her back, & she has seen DrWhitfield for that... She lists mult OTC GI meds including> Prilosec, Miralax, Metamucil, Senakot, Mylicon...    BP controlled on her Coreg, Amlodipine, Lasix; BP= 120/68 today & denies CP/ palpit, ch in DOE,  edema, etc...    She saw DrThorne recently for f/u & note pending; prev note 11/12 reviewed> she has done a thorough investigation...    She saw DrWhitfield 12/12 for f/u LBP w/ known DJD & severe sp stenosis L4-5 & neurogenic claudication; she did PT & has a brace, he referred her to [facility] for shots...  ~  Mar 29, 2012:  66mo ROV & she continues w/ mult complaints- ran out of Dymista sample for nasal symptoms (didn't help), thinks her BP is too low, c/o swelling (has VI- reminded to elim sodium, wear support hose, elevate, take Lasix40), "constip" but stool is loose & soft (rec- Align), saw DrEdwards again for GI- she denies dysphagia, still c/o chr abd discomfort, etc; c/o "dark urine" it was clear and C&S was neg; she requires constant reassurance about her condition & that she does not have cancer; asked to take her Xanax more regularly...  We reviewed prob list, meds, xrays and labs>  See below>>   ~  June 28, 2012:  66mo ROV & Heather [name] continues to have mult somatic complaints & says "terrible, terrible, terrible" states she's lost all the muscles in her abd & now uses a girdle; "all my doctors say for me to see you" eg> DrEdwards, DrNishan, etc; she still believes that she has cancer "somewhere- just like my friend"... Then she launches into a flight of ideas of complaints> carpel tunnel symptoms (she wants to f/u w/ DrWhtfield), cough/ phlegm, dizzy, "it's my stomach", etc...    We reviewed prob list, meds, xrays and labs> see below for  updates >>  ~  August 03, 2012:  12mo ROV & add-on at pt's insistance for "hocking- it's not a cough, it's a hock"; then she proceeds to clear her throat forcefully & "hock-up" some clear phlegm; she notes "thick white foamy slimy sputum w/ flecks of chopped meat in it"; she denies cough/ hemoptysis/ fcs, etc; she notes some mild dyspnea (at baseline) and occas CP from the force of the hocking; she is quite insistent on additional work-up & Rx for this perceived problem:     Exam is clear- no oral lesions, neck is neg, chest is clear...    CXR shows sl overinflation, clear lungs, normal heart size w/ elongation & tortuous Ao...    PFTs are wnl> FVC=2.62 (128%), FEV1=2.20 (158%), %1sec=84; mid-flows=274% of predicted...    Note> she is quite anxious & has a cancer phobia; again asked to take her Alprazolam Tid regularly... We reviewed prob list, meds, xrays and labs> see below for update >> We decided on a trial regimen to help her "hocking" w/ short term f/u to assess efficacy> MUCINEX 600mg - 2Bid w/ lots of fluids; HYCODAN syrup- 1tspQid; DULERA100/5- 1puffBid then rinse w/ Listerine... Note> before completing her eval today she insisted in a right breast exam stating that DrBertrand insisted that her PCP examine her breast at every visit> it was neg.  ~  August 30, 2012:  12mo ROV & recheck> "I'm just falling apart, meds not helping, Dulera choked me up, mouth glued together" therefore she stopped the [facility] & does better on no Rx she says "there is something wrong w/ me, I'm losing so much weight, I'm skin & bones" wt today= 156# which is up 8# from last visit; she is c/o snorting phlegm that she says tastes bitter & has little white balls in it (she could not produce any sput despite a lot of hocking in the office today (advised to  continue Mucinex600mg -2Bid w/ fluids)...  Also c/o left lat CWP, tender ribs & wears lumbar brace/ rib binder, +takes Hydrocodone/ Tramadol as needed...     We reviewed  prob list, meds, xrays and labs> see below for updates >> she declines the 2013 flu vaccine... LABS 10/13:  Chems-wnl;  CBC- wnl;  TSH= 1.79  ~  September 27, 2012:  97mo ROV & Heather [name] is rambling on w/ 'flight of ideas" today & due to this we tried a systems approach & touched on all these problems during her visit today>>    Resp symptoms> c/o SOB, hocking phlegm, "this throat business" for which she has seen DrRosen, ENT; now c/o decr hearing, thick phlegm, etc; We reviewed CXR 10/13- clear & wnl; PFT 10/13- normal airflow; Rec- Mucinex600mg -2Bid, lots of fluids, & f/u w/ ENT for hearing eval...    HBP> on Coreg3.125Bid, Amlod2.5, Lisin40; BP= 120/64 & we reviewed labs from 10/13- all wnl...    HxCP> on ASA81; followed by DrBensimhon/ Walker Kehr for Cards & their notes are reviewed=> neg ischemic work up...    VI, Edema> she went to the ER 11/13 w/ edema; VDopplers were neg for DVT; they reiterated the recs for no salt, elevation, support hose, & continue Lasix40.    GI- HH, Gastirits, Divertics, chronic pain & "swelling"> on Omep20- taking 2/d & we discussed derc to one daily; plus Miralax & Senakot-S...    Renal cyst> she is very anxious about everything & has a cancer phobia; DrKimbrough followed this benign simple cyst for yrs- no change...    Hx Breast cancer> she had left mod radical mastectomy 1994 by DrBlievernicht- no known recurrence & she is given reassurance...    Ortho- DJD, Cspine spongy, LBP, CTS> followed by DrWhitfield; on Vicodin & Tramadol but prefers the latter she says...     Anxiety> on Alpraz0.5mg Tid but "I'm not taking it very often" she says & encouraged to use more regularly... We reviewed prob list, meds, xrays and labs> see below for updates >>            Problem List:  RESPIRATORY SYMPTOMS >> c/o "hocking phlegm" DYSPNEA (ICD-786.9) - eval 5/11 c/o SOB- can't get a DB, can't get enough air in, etc... occurs at rest & w/ exerc "I'm very active despite my arthritis",  talks rapidly in long sentences w/o apparent distress... no cough, phlegm, hemoptysis, etc- but as usual she is quite distressed by these symptoms and wants further eval> we discussed re-assessment w/ CXR (clear, NAD);  PFT (totally norm airflow);  Labs (all WNL);  Rx w/ ALPRAZOLAM 0.5mg  Tid & encouraged to take regularly. ~  CXR 4/13 showed norm heart size, clear lungs, mild TSpine spondylosis... ~  She continues to complain of drainage, throat symptoms, etc; Rx w/ Mucinex, OTC antihist, Nasonex, etc;  Seen by ENT, DrRosen w/ ?xerostomia & rec incr water etc. ~  9/13: Add-on visit for "hocking, it's not a cough, it's a hock"> see above... ~  9/13: CXR shows sl overinflation, clear lungs, normal heart size w/ elongation & tortuous Ao;  PFTs are wnl> FVC=2.62 (128%), FEV1=2.20 (158%), %1sec=84; mid-flows=274% of predicted... We discussed trial rx for her symptoms- Mucinex, Fluids, Hycodan, Dulera100- 1puffbid. ~  10/13: she reports INTOL to [facility] w/ throat & mouth symptoms "like glue" therefore stopped this med; advised to continue the others.  HYPERTENSION (ICD-401.9) - on AMLODIPINE 2.5mg /d,  COREG 3.125mg Bid,  LASIX 40mg - taking 1tab/d now (prev said that made her weak). ~  7/12:  C/o incr edema in legs & advised to elim sodium from diet & incr her LASIX to 40mg /d... ~  10/12:  Prev Cozaar 50mg  was stopped 10/12 via the ER when she presented w/ ?angioedema of the tongue... ~  2/13:  BP= 120/68 & doing well on 3 med regimen & off Losartan. ~  5/13:  BP= 110/60 & she has mult somatic complaints but denies HA, CP, palpit, ch in SOB or edema... ~  8/13:  BP= 112/58 & there is no change in her mult somatic complaints... ~  9/13:  BP= 100/60 on Coreg3.125Bid & Amlod2.5 & prn Lasix... ~  10/13:  BP= 120/58 & she has mult somatic complaints as before... ~  11/13: on Coreg3.125Bid, Amlod2.5, Lisin40; BP= 120/64 & we reviewed labs from 10/13- all wnl...   Hx of CHEST PAIN (ICD-786.50) - she takes ASA  81mg /d + Tylenol Prn... followed by DrBensimhon for Cards. ~  Baseline EKG w/ RBBB & LAD, no acute changes... ~  NuclearStressTest done 2007 & 2009 showed normal- without scar or ischemia, EF=67%...   ~  2DEcho 7/08 showed mild AI, mild diastolic dysfunction, EF=55-60%. ~  CT Abd 1/09 & 9/12 showed incidental coronary calcif, & aorto-iliac atherosclerotic changes as well... ~  8/13: she saw DrNishan> HBP, DD, atypCP w/ neg Myoviews & no changes made... ~  10/13:  She uses a back brace/ abd binder to help her CWP...  VENOUS INSUFFICIENCY (ICD-459.81) - she has mod VI and follows a low sodium diet, elevates legs when able, and wears support hose. ~  7/12:  C/o incr edema & we reviewed a 2gm sodium diet & increased her Lasix from 20mg /d to 40mg /d... ~  5/13:  She has persist complaints but mild chr VI w/ min edema & reminded to elim sodium, elev, support hose, take the Lasix... ~  11/13: she went to the ER w/ edema; VDopplers were neg for DVT; they reiterated the recs for no salt, elevation, support hose, & continue Lasix40.  HIATAL HERNIA (ICD-553.3) & GASTRITIS (ICD-535.50) - on OMEPRAZOLE 20mg /d & followed by DrJEdwards for GI having fired DrStark & [facility] in past. ~  last EGD was 2/08 w/ 3cm HH & gastitis... ~  CT Abd 1/09 revealed calcif gallstones, & mild ectasia of AA... ~  CT Abd 1/11 showed left colon divertics, tiny hepatic lesions w/o change & right renal cyst stable. ~  CT Abd 9/12 showed divertics (no inflamm), right renal & left hep lobe cysts, atherosclerotic changes, NAD... ~  Seen by DrEdwards 4/13> c/o dysphagia & congestion; he notes mult GI complaints, known esoph dysmotility w/o stricture, hard to pin down her complaints...  DIVERTICULOSIS, COLON (ICD-562.10) - followed regularly by DrStark. ~  colonoscopy 7/03 by DrStark showing divertics only... we discussed Miralax + Senakot-S for constipation. ~  colonoscopy 2/11 showed divertics, 3mm polyp, hems...  ? of ABDOMINAL PAIN &  SYMPTOMATOLOGY - she had a very thorough work up in 2009> she has a cancer phobia and is very difficult to reassure her that everything appropriate has been done & no cancer found... Mult additional GI evals since then from [facility], [facility] (DrEdwards), [facility] (DrThorne)... ~  eval by GI- DrStark 12/08 but pt wasn't satisfied w/ his examination... ~  full labs 1/09 were WNL, sed=20... ~  CT Abd/ Pelvis 1/09= no acute abn in abd (calcif gallstones & mild ectasia of AA), calcif fibroid in the pelvis, & ?regarding the left ovary. ~  referral to DrFontaine, GYN>  his notes are reviewed... ~  follow up w/ DrKimbrough, Urology> his notes are reviewed... ~  eval DrWhitfield for Orthopedics- rec shots in back by [facility] w/ some improvement. ~  repeat GI eval 1/11 by DrStark w/ CT & colonoscpy as above- no acute problems. ~  5/11 & 7/11 >> persist complaints w/ neg recheck by DrStark & DrKimbrough... ~  12/11:  routine GYN surveillance DrFontaine found anteverted uterus w/ fibroids, atrophic ovaries bilat, no mass. ~  Repeat GI eval 4/12 by DrStark> he tried to reassure the pt, felt some symptoms might be from poor abd wall muscle tone, rec continue Omep, laxatives... ~  6/12:  She sought another GI opinion DrEdwards> he did not feel she needed any additional testing... ~  She tells me she has yet another appt to see a gastroenterologist at [facility]==> seen by DrThorne 8/12 & note reviewed... ~  EGD by DrThorne 9/12 w/ mult whitish nodular plaques in cardia & fundus> Bx results pending & she is very concerned (remember her hx cancer phobia). ~  She continues to f/u w/ DrThorne at [facility] & is happy w/ her eval...  RENAL CYST (ICD-593.2) - she has a 1 cm right renal cyst seen on CT in 2007 and followed by DrKimbrough... f/u CT Abd 1/09 - no change in the cyst... f/u renal ultrasound 1/10 w/ small bilat cysts... f/u CT Abd 1/11- no change in cyst. ~  she saw DrKimbrough 7/11 for LBP, microhematuria, urge  incontinence- stable, no additional Rx. ~  she saw DrKimbrough 1/12 for urge incontinence, freq, nocturia> rec Desmopressin 0.2mg - 1/2 tab Qhs.  CARCINOMA, BREAST, LEFT (ICD-174.9) - Surgery 9/94 by DrBlievernicht w/ Left modified radical mastectomy... +estrogen receptors and Rx w/ Tamoxifen... f/u mammograms all neg... breast exam on right has been neg- no nodules palpated... She says that DrBertrand insisted that her PCP check her right breast at each & every office visit to be sure she doesn't develop any nodules...  DEGENERATIVE JOINT DISEASE (ICD-715.90) - on OTC meds + VICODIN Prn... she has end-stage OA of Right Knee per DrWhitfield treated w/ shots in the past... she has a knee brace and a cane for ambulation... ~  8/13:  She wants a better pain pill & we discussed change to [facility] 5/325 Tid prn...  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 Rx w/ rest, heat, Percocet, and f/u w/ Ortho...  Hx of LOW BACK PAIN, CHRONIC (ICD-724.2) - eval DrWhitfield w/ rec for shots by DrNewton & she's feeling sl better & wears back brace... ~  5/10: mult somatic complaints- primarily c/o left side/ postero-lat rib pain...  Exam w/ tender left lat/ post ribs... Bone Scan= neg... ~  MRI Lumbar spine 7/10 by DrWhitfield... ~  Epidural steroid shot by DrNewton 8/10 & 1/11 ~  Repeat LBP eval by DrWhitfield w/ another MRI lumbar spine showing degenerative changes w/ mild progression since 7/10 MRI, mild-mod sp stenosis & lat recess stenosis at several levels ~  DrWhitfield referred her to [facility] to consider more shots but she is not so inclined... ~  6/13: she saw DrTooke w/ lumbar spondylosis; he felt that he had nothing to offer her & rec incr Tramadol to Qid...  CARPAL TUNNEL SYNDROME, BILATERAL (ICD-354.0) - she had prev right carpal tunnel release by DrWhitfield and was c/o increasing discomfort in her left wrist despite wrist  splint Qhs- had left carpal tunnel release 1/10 by DrWhitfield...  ANXIETY (  ICD-300.00) - on ALPRAZOLAM 0.5mg  Tid & encouraged to take it regularly... she has a cancer phobia & it is very difficult to reassure her regarding her symptoms and our evaluations... ~  7/13: she went to the ER c/o swelling of the tongue but eval by EDP indicated that her tongue was normal & not swollen, she was not on an ACE, given Pred Rx anyway.   Past Surgical History  Procedure Date  . Left mastectomy   . Carpal tunnel release     Outpatient Encounter Prescriptions as of 09/27/2012  Medication Sig Dispense Refill  . ALPRAZolam (XANAX) 0.5 MG tablet Take 0.5 mg by mouth 3 (three) times daily as needed. For anxiety      . amLODipine (NORVASC) 2.5 MG tablet Take 2.5 mg by mouth daily. For blood pressure      . aspirin EC 81 MG tablet Take 81 mg by mouth daily.      . carvedilol (COREG) 3.125 MG tablet Take 3.125 mg by mouth 2 (two) times daily with a meal. For blood pressure      . Cholecalciferol (VITAMIN D3) 1000 UNITS CAPS Take 1 capsule by mouth daily. For your bones      . furosemide (LASIX) 40 MG tablet Take 1 tablet (40 mg total) by mouth daily. For swelling  30 tablet  1  . guaiFENesin (MUCINEX) 600 MG 12 hr tablet Take 1,200 mg by mouth 2 (two) times daily. For congestion      . Multiple Vitamins-Minerals (MULTIVITAMINS THER. W/MINERALS) TABS Take 1 tablet by mouth daily.      Marland Kitchen omeprazole (PRILOSEC) 20 MG capsule Take 40 mg by mouth daily. For her stomach      . polyethylene glycol (MIRALAX / GLYCOLAX) packet Take 17 g by mouth daily as needed. For constipation      . PRESCRIPTION MEDICATION Place 1 drop into both eyes daily. Eye drops (samples were given to patient by Dr. Dione Booze)      . sennosides-docusate sodium (SENOKOT-S) 8.6-50 MG tablet Take 1 tablet by mouth daily as needed. For constipation      . HYDROcodone-acetaminophen (NORCO) 5-325 MG per tablet Take 1/2 to 1 tablet by mouth three times  daily as needed for pain  90 tablet  5  . traMADol (ULTRAM) 50 MG tablet Take 50 mg by mouth every 6 (six) hours as needed. As needed for pain        Allergies  Allergen Reactions  . Fentanyl     REACTION: nausea    Current Medications, Allergies, Past Medical History, Past Surgical History, Family History, and Social History were reviewed in [employer] record.    Review of Systems      See HPI - all other systems neg except as noted... She has mult somatic complaints and anxiety. The patient complains of decreased hearing, chest discomfort, dyspnea on exertion, abdominal pain, incontinence, muscle weakness, and difficulty walking.  The patient denies anorexia, fever, vision loss, hoarseness, syncope, peripheral edema, prolonged cough, headaches, hemoptysis, melena, hematochezia, severe indigestion/heartburn, hematuria, transient blindness, depression, abnormal bleeding, enlarged lymph nodes, and angioedema.     Objective:   Physical Exam   WD, WN, 78 y/o BF in NAD... she is very anxious... GENERAL:  Alert & oriented; pleasant & cooperative... HEENT:  [place]/AT, EOM-wnl, PERRLA, EACs-clear, TMs-wnl, NOSE-clear, THROAT-clear & wnl, no lesions seen. NECK:  Neck ROM decreased, no JVD; normal carotid impulses w/o bruits; no thyromegaly or nodules palpated; no lymphadenopathy. CHEST:  Clear to P & A; without wheezes/ rales/ or rhonchi heard... + tender right lat 11th & 12th ribs on palp. BREAST:  s/p left mastectomy... right breast normal- without nodules palpated... HEART:  Regular Rhythm; without murmurs/ rubs/ or gallops detected... ABDOMEN:  Soft & nontender; normal bowel sounds; no organomegaly or masses palpated... EXT: without deformities, mod arthritic changes; no varicose veins/ +venous insuffic/ tr edema. NEURO:  CN's intact; no focal neuro deficits... DERM:  Small sl excoriated area of dermatitis left shin- no drainage,no rash, etc...  RADIOLOGY DATA:   Reviewed in the EPIC EMR & discussed w/ the patient...  LABORATORY DATA:  Reviewed in the EPIC EMR & discussed w/ the patient...   Assessment & Plan:    RESPIRATORY SYMPTOMS w/ Dyspnea & throat clearing>  She had a neg pulm eval 2011 as above; she insisted on additional eval 9/13 for her chr throat clearing symptom; CXR w/o acute changes & PFTs normal for her 78 y/o lungs; she is quite insistent on med rx for her problem & we reviewed Mucinex, Hycodan, Dulera, & Listerine (she was intol to the [facility]); pt very anxious & Alprazolam seems to help when she takes it...  HBP>  Controlled on low doses of several meds including Coreg, Norvasc & Lasix;  She is encouraged to take meds regularly...  Hx CP> evaluated for Cards by DrBensimon & he has not been in favor of invasive testing; stable on conservative med rx...  VI/ Edema>  She is concerned about incr edema & we reviewed 2gm sodium diet & rec keep Lasix at 40mg /d...  GI> HH, Gastritis, Divertics, vague abd pain>  I tried once again to reassure her about the thorough GI evaluations that her has undergone; she is happy w/ DrThorne's eval & will f/u w/ her & in the meanwhile continue rx w/ Prilosec40=>20, Miralax, Metamucil, Senakot, Mylicon, etc...  Renal cyst>  This is benign & has been followed by DrKimbrough...  Hx left Breast Cancer>  No known recurrence...  DJD> Cervical spondylosis & chronic LBP>  She has been evaluated by DrWhitfield for Ortho & given epid steroid shots by DrNewton... She wants a better pain pill & we tried [facility] 5/325 tid as needed but she prefers the Tramadol...  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  Anxiety>  Asked once again to increase the Alprazolam to 1/2 - 1 tab TID regularly...   Patient's Medications  New Prescriptions   No medications on file  Previous Medications   ALPRAZOLAM (XANAX) 0.5 MG TABLET    Take 0.5 mg by mouth 3 (three) times daily as  needed. For anxiety   AMLODIPINE (NORVASC) 2.5 MG TABLET    Take 2.5 mg by mouth daily. For blood pressure   ASPIRIN EC 81 MG TABLET    Take 81 mg by mouth daily.   CARVEDILOL (COREG) 3.125 MG TABLET    Take 3.125 mg by mouth 2 (two) times daily with a meal. For blood pressure   CHOLECALCIFEROL (VITAMIN D3) 1000 UNITS CAPS    Take 1 capsule by mouth daily. For your bones   FUROSEMIDE (LASIX) 40 MG TABLET    Take 1 tablet (40 mg total) by mouth daily. For swelling   GUAIFENESIN (MUCINEX) 600 MG 12 HR TABLET    Take 1,200 mg by mouth 2 (two) times daily. For congestion   HYDROCODONE-ACETAMINOPHEN (NORCO) 5-325 MG PER TABLET    Take 1/2 to 1  tablet by mouth three times daily as needed for pain   MULTIPLE VITAMINS-MINERALS (MULTIVITAMINS THER. W/MINERALS) TABS    Take 1 tablet by mouth daily.   OMEPRAZOLE (PRILOSEC) 20 MG CAPSULE    Take 40 mg by mouth daily. For her stomach   POLYETHYLENE GLYCOL (MIRALAX / GLYCOLAX) PACKET    Take 17 g by mouth daily as needed. For constipation   PRESCRIPTION MEDICATION    Place 1 drop into both eyes daily. Eye drops (samples were given to patient by Dr. Dione Booze)   SENNOSIDES-DOCUSATE SODIUM (SENOKOT-S) 8.6-50 MG TABLET    Take 1 tablet by mouth daily as needed. For constipation   TRAMADOL (ULTRAM) 50 MG TABLET    Take 50 mg by mouth every 6 (six) hours as needed. As needed for pain  Modified Medications   No medications on file  Discontinued Medications   No medications on file

## 2012-09-27 NOTE — Patient Instructions (Addendum)
Today we updated your med list in our EPIC system...    Continue your current medications the same...  Continue w/ your evaluation from drRosen...  Let's plan a follow up visit in 1 month.Marland KitchenMarland Kitchen

## 2012-09-29 ENCOUNTER — Other Ambulatory Visit: Payer: Medicare Other

## 2012-10-01 ENCOUNTER — Ambulatory Visit
Admission: RE | Admit: 2012-10-01 | Discharge: 2012-10-01 | Disposition: A | Discharge status: Home / Self Care | Payer: Medicare Other | Source: Ambulatory Visit | Attending: Otolaryngology | Admitting: Otolaryngology

## 2012-10-01 DIAGNOSIS — R1310 Dysphagia, unspecified: Secondary | ICD-10-CM

## 2012-10-11 ENCOUNTER — Telehealth: Payer: Self-pay | Admitting: Pulmonary Disease

## 2012-10-11 ENCOUNTER — Other Ambulatory Visit: Payer: Self-pay | Admitting: Pulmonary Disease

## 2012-10-11 MED ORDER — TRAMADOL HCL 50 MG PO TABS: 50.0000 mg | ORAL_TABLET | Freq: Four times a day (QID) | ORAL | Status: DC | PRN

## 2012-10-11 NOTE — Telephone Encounter (Signed)
Pt returned call. Heather [name] °

## 2012-10-11 NOTE — Telephone Encounter (Signed)
Per SN---put her in the appt slot on 12-19 at 11:30  And the only treatment is mucinex 2 po bid with plenty of fluids.  thanks

## 2012-10-11 NOTE — Telephone Encounter (Signed)
(  continued)  I offered the pt the next available appt w/ SN.  Pt refused stating she all ready has an appt.  Pt states she was given a card w/ an appt & wants the appt that was written down on her card.  [name]

## 2012-10-11 NOTE — Telephone Encounter (Signed)
appt has been added pt aware. She has also been taking mucinex. Nothing further was needed

## 2012-10-11 NOTE — Telephone Encounter (Signed)
Last ov 09/27/12 Per pt she was giving a card stating she has an appt with SN on 11/04/12 at 11:30 AM. She stated if we wanted her too she will bring in the card and show the card to [place]. Please advise thanks

## 2012-10-11 NOTE — Telephone Encounter (Signed)
LMTCB x 1 

## 2012-10-11 NOTE — Telephone Encounter (Signed)
Spoke with pt verified msg She states needs refill on tramadol Rx was sent to pharm Nothing further needed

## 2012-10-27 ENCOUNTER — Ambulatory Visit (INDEPENDENT_AMBULATORY_CARE_PROVIDER_SITE_OTHER): Payer: Medicare Other | Admitting: Pulmonary Disease

## 2012-10-27 ENCOUNTER — Encounter: Payer: Self-pay | Admitting: Pulmonary Disease

## 2012-10-27 VITALS — BP 120/60 | HR 83 | Temp 96.8°F | Ht 60.0 in | Wt 150.8 lb

## 2012-10-27 DIAGNOSIS — M47812 Spondylosis without myelopathy or radiculopathy, cervical region: Secondary | ICD-10-CM

## 2012-10-27 DIAGNOSIS — I872 Venous insufficiency (chronic) (peripheral): Secondary | ICD-10-CM

## 2012-10-27 DIAGNOSIS — K219 Gastro-esophageal reflux disease without esophagitis: Secondary | ICD-10-CM

## 2012-10-27 DIAGNOSIS — Z853 Personal history of malignant neoplasm of breast: Secondary | ICD-10-CM

## 2012-10-27 DIAGNOSIS — K5900 Constipation, unspecified: Secondary | ICD-10-CM

## 2012-10-27 DIAGNOSIS — I10 Essential (primary) hypertension: Secondary | ICD-10-CM

## 2012-10-27 DIAGNOSIS — F411 Generalized anxiety disorder: Secondary | ICD-10-CM

## 2012-10-27 DIAGNOSIS — M1990 Unspecified osteoarthritis, unspecified site: Secondary | ICD-10-CM

## 2012-10-27 DIAGNOSIS — I519 Heart disease, unspecified: Secondary | ICD-10-CM

## 2012-10-27 DIAGNOSIS — K5730 Diverticulosis of large intestine without perforation or abscess without bleeding: Secondary | ICD-10-CM

## 2012-10-27 DIAGNOSIS — R6889 Other general symptoms and signs: Secondary | ICD-10-CM

## 2012-10-27 MED ORDER — FUROSEMIDE 40 MG PO TABS: 40.0000 mg | ORAL_TABLET | Freq: Every day | ORAL | Status: DC

## 2012-10-27 NOTE — Patient Instructions (Addendum)
Today we updated your med list in our EPIC system...    Continue your current medications the same...  Let's plan a follow up visit in about 1 month to see how you are doing after the holidays.Marland KitchenMarland Kitchen

## 2012-10-27 NOTE — Progress Notes (Signed)
Subjective:    Patient ID: Heather [name], female    DOB: 1918/05/21, 78 y.o.   MRN: [id number]  HPI 78 y/o BF here for a follow up visit... she has multiple medical problems including:  Dyspnea & Anxiety;  HBP;  Hx CP followed by DrBensimhon;  HH/ Gastritis;  Divertics/ Constip;  Functional Abd Pain;  Renal cyst followed by DrKimbrough;  Hx left breast [phone];  DJD/ CSpine spondy/ LBP/ CTS; and she has a cancer phobia...  ~  December 26, 2010:  add-on appt due to right side pain x4d she says> "I feel fine, BUT..." states the right post CWP is unbearable, sharp, worse w/ movement, & denies trauma... area over the right 11-12th ribs is tender to palp & reproduces her discomfort;  chest is clear & no rubs w/ O2 sat 98% RA;  last CXR 9/11 clear, some scarring, ectatic Ao, DJD sp;  she wants to relate this all to her prev complaints of Abd pain & swelling, left flank pain, etc (recall extensive evals w/ nothing signif found to explain her varied symptoms)...  we discussed rx w/ rest, heat, rib binder, Lidoderm patches, Vicodin Prn.  ~  February 10, 2011:  6wk ROV- she saw TP 3/16 w/ cough & mult somatic complaints trouble swallowing, throat closing, drainage, nasal congestion, "harking phlegm" etc;  CXR= stable, ectatic Ao, NAD;  Labs include BMet=wnl, BNP=110, UA=clear;  Rx w/ Zyrtek, Saline, Nasonex, Pepcid, Mucinex, & keep appt w/ DrStark for swallowing eval... Now c/o same constellation of symptoms more or less, never took the Zyrtek or Nasonex (too$$) & we discussed regimen w/ Zyrtek AM, Saline/ Mucinex during the day/ Flonase Qhs...    Dyspnea>  On Mucinex plus her anxiolytics (Alprazolam) but not taking regularly...    HBP>  On Coreg, Norvasc, Cozaar, Lasix & BP= 110/64 & well controlled, she thinks too many meds...    GI>  She has hx HH, gastritis, divertics, constip, & functional abd pain w/ mult extensive evaluations in the past;  On Omep20, Miralax, Senakot-S but she persists w/ c/o swelling &  "something wrong in my stomach it's getting bigger & bigger- I might have cancer"; last CT Abd 1/11 reviewed- divertics, otherw neg;  States her appetite is OK but weight down 12#;  C/o left side of abd hurts when she gets up to walk, no pain sitting or supine;  Offered additional meds (try Levsin or bentyl but she declines);  She has up coming appt w/ DrStark.    DJD>  Followed by DrWhitfield & she's had epid shots by DrNewton in the past    Anxiety>  On Alprazolam but she won't take it regularly for help w/ her severe anxiety & cancer phobia...  ~  June 12, 2011:  26mo ROV & she persists w/ mult somatic complaints & flight of ideas hx, "I have really suffered" she says...    GI> Hx HH, gastritis, divertics, & vague abd pains> she is not happy w/ DrStark's eval & sought second opinion (DrMann declined to see her); she saw DrJEdwards & his notes are reviewed;  Today she is quite distraught about papers to fill out for the GI dept at [facility]- DrNThorne...    Ortho> c/o pain in left leg w/ some edema (she has VI & dependent edema) & states her legs feel like lead; exam shows some incr in LE edema & we discussed incr in her Lasix from 20mg  to 40mg /d plus a 2gm sodium diet.Marland KitchenMarland Kitchen  We rechecked her labs today> all WNL.Marland Kitchen.  ~  July 09, 2011:  65mo ROV & add-on due to sm area of dermatitis on her left leg, sl excoriated & sm scab==> her son is here today & told me she drove him crazy w/ her ruminations about this being a cancer & that we would send her to the cancer center, etc (she got angry at him for telling me); we discussed dermatitis & rec RX w/ LIDEX-E cream to apply Bid & rec Derm review if not resolved...    She is also quite concerned about a "bump" on the back of her head==> occipital bone & it's symmetric, she is reassured...    She has been to [facility] GI Dept DrThorne & note is pending but she has lab sheet (all wnl) & is sch for EGD 9/12; they told her to stop Metamucil, and take Miralax, Senakot; she is most  concerned/ upset by an evaluation form she received asking questions like how long she waited for this or that during her visit & she doesn't know what to do or how to complete the info! I encouraged her to take her Alpraz regularly Tid...  ~  September 22, 2011:  2.95mo ROV & she states "terrible" c/o phlegm, swelling, feeling bad, wt loss, "this throat business- they found white balls"; very anxious w/ mult somatic complaints >>    HBP> on Norvasc2.5, Coreg3.125Bid, Losartan50, Lasix20-40/d; she went to the ER w/ ?angioedema of the tongue, seen by [facility] who called me & we decided to stop her Cozaar; BP today off Cozaar & on the other 3 meds showed 122/72> rec to continue the same...    GI> she saw DrThorne at [facility] 9/12 for a comprehensive GI eval> note reviewed- they did an EGD 08/06/11 w/ mult white nodular plaques seen in cardia & fundus- Bx'd & pending (she is very concerned);  she went to the ER 08/16/11 w/ abd discomfort- the same vague left side abd discomfort that she has seen mult physicians about- Exam showed mild diffuse tenderness;  they did CT Abd showing divertics, no inflamm, right renal & left hep lobe cysts, atherosclerotic changes, NAD;  Labs & urine were essentially WNL;  EKG showed NSR, RBBB & LAD, no acute changes...  she saw DrEdwards [facility]) 10/12 w/ mult complaints including "a knot on her skull & she feels that her brains are coming out"; he thought she was doing reasonably well from the GI standpoint w/ her dysphagia, vague abd pain, & constip...    Ortho> she saw DrWhitfield w/ MRI Lumbar spine 10/12 showing degenerative changes w/ mild progression since 7/10 MRI, mild-mod sp stenosis & lat recess stenosis at several levels; she has Vicodin to take up to 3/d as needed for pain; she is anxiously awaiting f/u visit w/ DrWhitfield...    Derm> DrLupton saw pt 07/23/11 w/ removal of sm squamous cell ca (in situ) from left lower leg  ~  December 29, 2011:  55mo ROV & Heather [name] has her  usual complement of somatic complaints> all seem stable w/o appreciable change; she does not want to see DrJEdwards again, but is happy w/ eval & communication from DrThorne at [facility] says her abd pain is coming from the arthritis in her back, & she has seen DrWhitfield for that... She lists mult OTC GI meds including> Prilosec, Miralax, Metamucil, Senakot, Mylicon...    BP controlled on her Coreg, Amlodipine, Lasix; BP= 120/68 today & denies CP/ palpit, ch in DOE,  edema, etc...    She saw DrThorne recently for f/u & note pending; prev note 11/12 reviewed> she has done a thorough investigation...    She saw DrWhitfield 12/12 for f/u LBP w/ known DJD & severe sp stenosis L4-5 & neurogenic claudication; she did PT & has a brace, he referred her to [facility] for shots...  ~  Mar 29, 2012:  66mo ROV & she continues w/ mult complaints- ran out of Dymista sample for nasal symptoms (didn't help), thinks her BP is too low, c/o swelling (has VI- reminded to elim sodium, wear support hose, elevate, take Lasix40), "constip" but stool is loose & soft (rec- Align), saw DrEdwards again for GI- she denies dysphagia, still c/o chr abd discomfort, etc; c/o "dark urine" it was clear and C&S was neg; she requires constant reassurance about her condition & that she does not have cancer; asked to take her Xanax more regularly...  We reviewed prob list, meds, xrays and labs>  See below>>   ~  June 28, 2012:  66mo ROV & Heather [name] continues to have mult somatic complaints & says "terrible, terrible, terrible" states she's lost all the muscles in her abd & now uses a girdle; "all my doctors say for me to see you" eg> DrEdwards, DrNishan, etc; she still believes that she has cancer "somewhere- just like my friend"... Then she launches into a flight of ideas of complaints> carpel tunnel symptoms (she wants to f/u w/ DrWhtfield), cough/ phlegm, dizzy, "it's my stomach", etc...    We reviewed prob list, meds, xrays and labs> see below for  updates >>  ~  August 03, 2012:  12mo ROV & add-on at pt's insistance for "hocking- it's not a cough, it's a hock"; then she proceeds to clear her throat forcefully & "hock-up" some clear phlegm; she notes "thick white foamy slimy sputum w/ flecks of chopped meat in it"; she denies cough/ hemoptysis/ fcs, etc; she notes some mild dyspnea (at baseline) and occas CP from the force of the hocking; she is quite insistent on additional work-up & Rx for this perceived problem:     Exam is clear- no oral lesions, neck is neg, chest is clear...    CXR shows sl overinflation, clear lungs, normal heart size w/ elongation & tortuous Ao...    PFTs are wnl> FVC=2.62 (128%), FEV1=2.20 (158%), %1sec=84; mid-flows=274% of predicted...    Note> she is quite anxious & has a cancer phobia; again asked to take her Alprazolam Tid regularly... We reviewed prob list, meds, xrays and labs> see below for update >> We decided on a trial regimen to help her "hocking" w/ short term f/u to assess efficacy> MUCINEX 600mg - 2Bid w/ lots of fluids; HYCODAN syrup- 1tspQid; DULERA100/5- 1puffBid then rinse w/ Listerine... Note> before completing her eval today she insisted in a right breast exam stating that DrBertrand insisted that her PCP examine her breast at every visit> it was neg.  ~  August 30, 2012:  12mo ROV & recheck> "I'm just falling apart, meds not helping, Dulera choked me up, mouth glued together" therefore she stopped the [facility] & does better on no Rx she says "there is something wrong w/ me, I'm losing so much weight, I'm skin & bones" wt today= 156# which is up 8# from last visit; she is c/o snorting phlegm that she says tastes bitter & has little white balls in it (she could not produce any sput despite a lot of hocking in the office today (advised to  continue Mucinex600mg -2Bid w/ fluids)......  Also c/o left lat CWP, tender ribs & wears lumbar brace/ rib binder, +takes Hydrocodone/ Tramadol as needed...     We  reviewed prob list, meds, xrays and labs> see below for updates >> she declines the 2013 flu vaccine... LABS 10/13:  Chems-wnl;  CBC- wnl;  TSH= 1.79  ~ September 27, 2012: 458mo ROV & Heather [name] is rambling on w/ 'flight of ideas" today & due to this we tried a systems approach & touched on all these problems during her visit today>>     Resp symptoms> c/o SOB, hocking phlegm, "this throat business" for which she has seen DrRosen, ENT; now c/o decr hearing, thick phlegm, etc; We reviewed CXR 10/13- clear & wnl; PFT 10/13- normal airflow; Rec- Mucinex600mg -2Bid, lots of fluids, & f/u w/ ENT for hearing eval...     HBP> on Coreg3.125Bid, Amlod2.5, Lisin40; BP= 120/64 & we reviewed labs from 10/13- all wnl...     HxCP> on ASA81; followed by DrBensimhon/ Walker Kehr for Cards & their notes are reviewed=> neg ischemic work up...     VI, Edema> she went to the ER 11/13 w/ edema; VDopplers were neg for DVT; they reiterated the recs for no salt, elevation, support hose, & continue Lasix40.     GI- HH, Gastirits, Divertics, chronic pain & "swelling"> on Omep20- taking 2/d & we discussed derc to one daily; plus Miralax & Senakot-S...     Renal cyst> she is very anxious about everything & has a cancer phobia; DrKimbrough followed this benign simple cyst for yrs- no change...     Hx Breast cancer> she had left mod radical mastectomy 1994 by DrBlievernicht- no known recurrence & she is given reassurance...     Ortho- DJD, Cspine spongy, LBP, CTS> followed by DrWhitfield; on Vicodin & Tramadol but prefers the latter she says...     Anxiety> on Alpraz0.5mg Tid but "I'm not taking it very often" she says & encouraged to use more regularly...  We reviewed prob list, meds, xrays and labs> see below for updates >>   ~  October 27, 2012:  458mo ROV & Heather [name] has mult somatic complaints as usual- dry mouth at night, hocking thick phlegm, c/o stomach/ back/ etc... Again we reviewed the following problems during today's visit>>     Resp symptoms> on Mucinex2Bid +Fluids; she saw DrRosen, ENT 11/13- dysphagia, presbycusis, referral otalgia; exam was normal, barium swallow 11/13 showed diminished primary peristaltic wave, mild tertiary contractions, prominent cricopharyngeus muscle, no hiatal hernia/ no reflux/ barium pill passed into the stomach without delay...    HBP> on Coreg3.125Bid, Amlod2.5, Lisin40; BP= 120/60 & she persists w/ mult somatic complaints...    HxCP> on ASA81; followed by DrBensimhon/ DrNishan for Cards & their notes are reviewed=> neg ischemic work up previously...     VI, Edema> she went to the ER 11/13 w/ edema; VDopplers were neg for DVT; they reiterated the recs for no salt, elevation, support hose, & continue Lasix40.     GI- HH, Gastirits, Divertics, chronic pain & "swelling"> on Omep20mg /d plus Miralax & Senakot-S; she states "my stomach has gone to my back".    Renal cyst> she is very anxious about everything & has a cancer phobia; DrKimbrough followed this benign simple cyst for yrs- no change...     Hx Breast cancer> she had left mod radical mastectomy 1994 by DrBlievernicht- no known recurrence & she is given reassurance...     Ortho- DJD, Cspine spondy, LBP, CTS> followed by DrWhitfield &  he injected her right knee 1wk ago; on Vicodin & Tramadol but prefers the latter she says...     Anxiety> on Alpraz0.5mg Tid but "I'm not taking it very often" she says & encouraged to use more regularly...  We reviewed prob list, meds, xrays and labs> see below for updates >>           Problem List:  RESPIRATORY SYMPTOMS >> c/o "hocking phlegm" DYSPNEA (ICD-786.9) - eval 5/11 c/o SOB- can't get a DB, can't get enough air in, etc... occurs at rest & w/ exerc "I'm very active despite my arthritis", talks rapidly in long sentences w/o apparent distress... no cough, phlegm, hemoptysis, etc- but as usual she is quite distressed by these symptoms and wants further eval> we discussed re-assessment w/ CXR (clear, NAD);   PFT (totally norm airflow);  Labs (all WNL);  Rx w/ ALPRAZOLAM 0.5mg  Tid & encouraged to take regularly. ~  CXR 4/13 showed norm heart size, clear lungs, mild TSpine spondylosis... ~  She continues to complain of drainage, throat symptoms, etc; Rx w/ Mucinex, OTC antihist, Nasonex, etc;  Seen by ENT, DrRosen w/ ?xerostomia & rec incr water etc. ~  9/13: Add-on visit for "hocking, it's not a cough, it's a hock"> see above... ~  9/13: CXR shows sl overinflation, clear lungs, normal heart size w/ elongation & tortuous Ao;  PFTs are wnl> FVC=2.62 (128%), FEV1=2.20 (158%), %1sec=84; mid-flows=274% of predicted... We discussed trial rx for her symptoms- Mucinex, Fluids, Hycodan, Dulera100- 1puffbid. ~  10/13: she reports INTOL to [facility] w/ throat & mouth symptoms "like glue" therefore stopped this med; advised to continue the others.  HYPERTENSION (ICD-401.9) - on AMLODIPINE 2.5mg /d,  COREG 3.125mg Bid,  LASIX 40mg - taking 1/2tab/d because 1 tab makes her too weak...  ~  7/12:  C/o incr edema in legs & advised to elim sodium from diet & incr her LASIX to 40mg /d... ~  10/12:  Prev Cozaar 50mg  was stopped 10/12 via the ER when she presented w/ ?angioedema of the tongue... ~  2/13:  BP= 120/68 & doing well on 3 med regimen & off Losartan. ~  5/13:  BP= 110/60 & she has mult somatic complaints but denies HA, CP, palpit, ch in SOB or edema... ~  8/13:  BP= 112/58 & there is no change in her mult somatic complaints... ~  9/13:  BP= 100/60 on Coreg3.125Bid & Amlod2.5 & prn Lasix... ~  10/13:  BP= 120/58 & she has mult somatic complaints as before... ~ 11/13: on Coreg3.125Bid, Amlod2.5, Lisin40; BP= 120/64 & we reviewed labs from 10/13- all wnl...  ~  12/13: on Coreg3.125Bid, Amlod2.5, Lisin40; BP= 120/60 & she persists w/ mult somatic complaints...  Hx of CHEST PAIN (ICD-786.50) - she takes ASA 81mg /d + Tylenol Prn... followed by DrBensimhon for Cards. ~  Baseline EKG w/ RBBB & LAD, no acute changes... ~   NuclearStressTest done 2007 & 2009 showed normal- without scar or ischemia, EF=67%...   ~  2DEcho 7/08 showed mild AI, mild diastolic dysfunction, EF=55-60%. ~  CT Abd 1/09 & 9/12 showed incidental coronary calcif, & aorto-iliac atherosclerotic changes as well... ~  8/13: she saw DrNishan> HBP, DD, atypCP w/ neg Myoviews & no changes made... ~  10/13:  She uses a back brace/ abd binder to help her CWP...  VENOUS INSUFFICIENCY (ICD-459.81) - she has mod VI and follows a low sodium diet, elevates legs when able, and wears support hose. ~  7/12:  C/o incr edema & we reviewed a  2gm sodium diet & increased her Lasix from 20mg /d to 40mg /d... ~  5/13:  She has persist complaints but mild chr VI w/ min edema & reminded to elim sodium, elev, support hose, take the Lasix... ~ 11/13: she went to the ER w/ edema; VDopplers were neg for DVT; they reiterated the recs for no salt, elevation, support hose, & continue Lasix40.  HIATAL HERNIA (ICD-553.3) & GASTRITIS (ICD-535.50) - on OMEPRAZOLE 20mg /d & followed by DrJEdwards for GI having fired DrStark & [facility] in past. ~  last EGD was 2/08 w/ 3cm HH & gastitis... ~  CT Abd 1/09 revealed calcif gallstones, & mild ectasia of AA... ~  CT Abd 1/11 showed left colon divertics, tiny hepatic lesions w/o change & right renal cyst stable. ~  CT Abd 9/12 showed divertics (no inflamm), right renal & left hep lobe cysts, atherosclerotic changes, NAD... ~  Seen by DrEdwards 4/13> c/o dysphagia & congestion; he notes mult GI complaints, known esoph dysmotility w/o stricture, hard to pin down her complaints...  DIVERTICULOSIS, COLON (ICD-562.10) - followed regularly by DrStark. ~  colonoscopy 7/03 by DrStark showing divertics only... we discussed Miralax + Senakot-S for constipation. ~  colonoscopy 2/11 showed divertics, 3mm polyp, hems...  ? of ABDOMINAL PAIN & SYMPTOMATOLOGY - she had a very thorough work up in 2009> she has a cancer phobia and is very difficult to reassure  her that everything appropriate has been done & no cancer found... Mult additional GI evals since then from [facility], [facility] (DrEdwards), [facility] (DrThorne)... ~  eval by GI- DrStark 12/08 but pt wasn't satisfied w/ his examination... ~  full labs 1/09 were WNL, sed=20... ~  CT Abd/ Pelvis 1/09= no acute abn in abd (calcif gallstones & mild ectasia of AA), calcif fibroid in the pelvis, & ?regarding the left ovary. ~  referral to DrFontaine, GYN> his notes are reviewed... ~  follow up w/ DrKimbrough, Urology> his notes are reviewed... ~  eval DrWhitfield for Orthopedics- rec shots in back by [facility] w/ some improvement. ~  repeat GI eval 1/11 by DrStark w/ CT & colonoscpy as above- no acute problems. ~  5/11 & 7/11 >> persist complaints w/ neg recheck by DrStark & DrKimbrough... ~  12/11:  routine GYN surveillance DrFontaine found anteverted uterus w/ fibroids, atrophic ovaries bilat, no mass. ~  Repeat GI eval 4/12 by DrStark> he tried to reassure the pt, felt some symptoms might be from poor abd wall muscle tone, rec continue Omep, laxatives... ~  6/12:  She sought another GI opinion DrEdwards> he did not feel she needed any additional testing... ~  She tells me she has yet another appt to see a gastroenterologist at [facility]==> seen by DrThorne 8/12 & note reviewed... ~  EGD by DrThorne 9/12 w/ mult whitish nodular plaques in cardia & fundus> Bx results pending & she is very concerned (remember her hx cancer phobia). ~  She continues to f/u w/ DrThorne at [facility] & is happy w/ her eval...  RENAL CYST (ICD-593.2) - she has a 1 cm right renal cyst seen on CT in 2007 and followed by DrKimbrough... f/u CT Abd 1/09 - no change in the cyst... f/u renal ultrasound 1/10 w/ small bilat cysts... f/u CT Abd 1/11- no change in cyst. ~  she saw DrKimbrough 7/11 for LBP, microhematuria, urge incontinence- stable, no additional Rx. ~  she saw DrKimbrough 1/12 for urge incontinence, freq, nocturia> rec Desmopressin  0.2mg - 1/2 tab Qhs.  CARCINOMA, BREAST,  LEFT (ICD-174.9) - Surgery 9/94 by DrBlievernicht w/ Left modified radical mastectomy... +estrogen receptors and Rx w/ Tamoxifen... f/u mammograms all neg... breast exam on right has been neg- no nodules palpated... She says that DrBertrand insisted that her PCP check her right breast at each & every office visit to be sure she doesn't develop any nodules...  DEGENERATIVE JOINT DISEASE (ICD-715.90) - on OTC meds + VICODIN Prn... she has end-stage OA of Right Knee per DrWhitfield treated w/ shots in the past... she has a knee brace and a cane for ambulation... ~  8/13:  She wants a better pain pill & we discussed change to [facility] 5/325 Tid prn... ~  12/13: she tells me that DrWhitfield injected her knee last wk- improved...  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 Rx w/ rest, heat, Percocet, and f/u w/ Ortho...  Hx of LOW BACK PAIN, CHRONIC (ICD-724.2) - eval DrWhitfield w/ rec for shots by DrNewton & she's feeling sl better & wears back brace... ~  5/10: mult somatic complaints- primarily c/o left side/ postero-lat rib pain...  Exam w/ tender left lat/ post ribs... Bone Scan= neg... ~  MRI Lumbar spine 7/10 by DrWhitfield... ~  Epidural steroid shot by DrNewton 8/10 & 1/11 ~  Repeat LBP eval by DrWhitfield w/ another MRI lumbar spine showing degenerative changes w/ mild progression since 7/10 MRI, mild-mod sp stenosis & lat recess stenosis at several levels ~  DrWhitfield referred her to [facility] to consider more shots but she is not so inclined... ~  6/13: she saw DrTooke w/ lumbar spondylosis; he felt that he had nothing to offer her & rec incr Tramadol to Qid...  CARPAL TUNNEL SYNDROME, BILATERAL (ICD-354.0) - she had prev right carpal tunnel release by DrWhitfield and was c/o increasing discomfort in her left wrist despite wrist splint Qhs- had left carpal tunnel release  1/10 by DrWhitfield...  ANXIETY (ICD-300.00) - on ALPRAZOLAM 0.5mg  Tid & encouraged to take it regularly... she has a cancer phobia & it is very difficult to reassure her regarding her symptoms and our evaluations... ~  7/13: she went to the ER c/o swelling of the tongue but eval by EDP indicated that her tongue was normal & not swollen, she was not on an ACE, given Pred Rx anyway.   Past Surgical History  Procedure Date  . Left mastectomy   . Carpal tunnel release     Outpatient Encounter Prescriptions as of 10/27/2012  Medication Sig Dispense Refill  . ALPRAZolam (XANAX) 0.5 MG tablet Take 0.5 mg by mouth 3 (three) times daily as needed. For anxiety      . amLODipine (NORVASC) 2.5 MG tablet       . aspirin EC 81 MG tablet Take 81 mg by mouth daily.      . carvedilol (COREG) 3.125 MG tablet Take 3.125 mg by mouth 2 (two) times daily with a meal. For blood pressure      . Cholecalciferol (VITAMIN D3) 1000 UNITS CAPS Take 1 capsule by mouth daily. For your bones      . furosemide (LASIX) 40 MG tablet Take 1 tablet (40 mg total) by mouth daily. For swelling  30 tablet  1  . guaiFENesin (MUCINEX) 600 MG 12 hr tablet Take 1,200 mg by mouth 2 (two) times daily. For congestion      . HYDROcodone-acetaminophen (NORCO) 5-325 MG per tablet Take 1/2 to 1 tablet by mouth three times daily  as needed for pain  90 tablet  5  . Multiple Vitamins-Minerals (MULTIVITAMINS THER. W/MINERALS) TABS Take 1 tablet by mouth daily.      Marland Kitchen omeprazole (PRILOSEC) 20 MG capsule Take 1 by mouth 30 min before the first meal of the day      . polyethylene glycol (MIRALAX / GLYCOLAX) packet Take 17 g by mouth daily as needed. For constipation      . PRESCRIPTION MEDICATION Place 1 drop into both eyes daily. Eye drops (samples were given to patient by Dr. Dione Booze)      . sennosides-docusate sodium (SENOKOT-S) 8.6-50 MG tablet Take 1 tablet by mouth daily as needed. For constipation      . traMADol (ULTRAM) 50 MG tablet Take 1  tablet (50 mg total) by mouth every 6 (six) hours as needed for pain. As needed for pain  30 tablet  5  . [DISCONTINUED] amLODipine (NORVASC) 2.5 MG tablet TAKE ONE TABLET BY MOUTH ONCE DAILY  30 tablet  6  . [DISCONTINUED] amLODipine (NORVASC) 2.5 MG tablet Take 2.5 mg by mouth daily. For blood pressure       Facility-Administered Encounter Medications as of 10/27/2012  Medication Dose Route Frequency Provider Last Rate Last Dose  . influenza  inactive virus vaccine (FLUZONE/FLUARIX) injection 0.5 mL  0.5 mL Intramuscular Once Michele Mcalpine, MD        Allergies  Allergen Reactions  . Fentanyl     REACTION: nausea    Current Medications, Allergies, Past Medical History, Past Surgical History, Family History, and Social History were reviewed in [employer] record.    Review of Systems      See HPI - all other systems neg except as noted... She has mult somatic complaints and anxiety. The patient complains of decreased hearing, chest discomfort, dyspnea on exertion, abdominal pain, incontinence, muscle weakness, and difficulty walking.  The patient denies anorexia, fever, vision loss, hoarseness, syncope, peripheral edema, prolonged cough, headaches, hemoptysis, melena, hematochezia, severe indigestion/heartburn, hematuria, transient blindness, depression, abnormal bleeding, enlarged lymph nodes, and angioedema.     Objective:   Physical Exam   WD, WN, 78 y/o BF in NAD... she is very anxious... GENERAL:  Alert & oriented; pleasant & cooperative... HEENT:  [place]/AT, EOM-wnl, PERRLA, EACs-clear, TMs-wnl, NOSE-clear, THROAT-clear & wnl, no lesions seen. NECK:  Neck ROM decreased, no JVD; normal carotid impulses w/o bruits; no thyromegaly or nodules palpated; no lymphadenopathy. CHEST:  Clear to P & A; without wheezes/ rales/ or rhonchi heard... + tender right lat 11th & 12th ribs on palp. BREAST:  s/p left mastectomy... right breast normal- without nodules  palpated... HEART:  Regular Rhythm; without murmurs/ rubs/ or gallops detected... ABDOMEN:  Soft & nontender; normal bowel sounds; no organomegaly or masses palpated... EXT: without deformities, mod arthritic changes; no varicose veins/ +venous insuffic/ tr edema. NEURO:  CN's intact; no focal neuro deficits... DERM:  Small sl excoriated area of dermatitis left shin- no drainage,no rash, etc...  RADIOLOGY DATA:  Reviewed in the EPIC EMR & discussed w/ the patient...  LABORATORY DATA:  Reviewed in the EPIC EMR & discussed w/ the patient...   Assessment & Plan:    RESPIRATORY SYMPTOMS w/ Dyspnea & throat clearing>  She had a neg pulm eval 2011 as above; she insisted on additional eval 9/13 for her chr throat clearing symptom; CXR w/o acute changes & PFTs normal for her 78 y/o lungs; she is quite insistent on med rx for her problem & we  reviewed Mucinex, Hycodan, Dulera, & Listerine (she was intol to the [facility]); pt very anxious & Alprazolam seems to help when she takes it...  HBP>  Controlled on low doses of several meds including Coreg, Norvasc & Lasix;  She is encouraged to take meds regularly...  Hx CP> evaluated for Cards by DrBensimon & he has not been in favor of invasive testing; stable on conservative med rx...  VI/ Edema>  She is concerned about incr edema & we reviewed 2gm sodium diet & rec keep Lasix at 40mg /d...  GI> HH, Gastritis, Divertics, vague abd pain>  I tried once again to reassure her about the thorough GI evaluations that her has undergone; she is happy w/ DrThorne's eval & will f/u w/ her & in the meanwhile continue rx w/ [id number], Miralax, Metamucil, Senakot, Mylicon, etc...  Renal cyst>  This is benign & has been followed by DrKimbrough...  Hx left Breast Cancer>  No known recurrence...  DJD> Cervical spondylosis & chronic LBP>  She has been evaluated by DrWhitfield for Ortho & given epid steroid shots by DrNewton... She wants a better pain pill & we tried [facility]  5/325 tid as needed but she prefers the Tramadol; knee shots from DrWhitfield helped...  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  Anxiety>  Asked once again to increase the Alprazolam to 1/2 - 1 tab TID regularly...   Patient's Medications  New Prescriptions   No medications on file  Previous Medications   ALPRAZOLAM (XANAX) 0.5 MG TABLET    Take 0.5 mg by mouth 3 (three) times daily as needed. For anxiety   ASPIRIN EC 81 MG TABLET    Take 81 mg by mouth daily.   CARVEDILOL (COREG) 3.125 MG TABLET    Take 3.125 mg by mouth 2 (two) times daily with a meal. For blood pressure   CHOLECALCIFEROL (VITAMIN D3) 1000 UNITS CAPS    Take 1 capsule by mouth daily. For your bones   GUAIFENESIN (MUCINEX) 600 MG 12 HR TABLET    Take 1,200 mg by mouth 2 (two) times daily. For congestion   HYDROCODONE-ACETAMINOPHEN (NORCO) 5-325 MG PER TABLET    Take 1/2 to 1 tablet by mouth three times daily as needed for pain   MULTIPLE VITAMINS-MINERALS (MULTIVITAMINS THER. W/MINERALS) TABS    Take 1 tablet by mouth daily.   OMEPRAZOLE (PRILOSEC) 20 MG CAPSULE    Take 1 by mouth 30 min before the first meal of the day   POLYETHYLENE GLYCOL (MIRALAX / GLYCOLAX) PACKET    Take 17 g by mouth daily as needed. For constipation   PRESCRIPTION MEDICATION    Place 1 drop into both eyes daily. Eye drops (samples were given to patient by Dr. Dione Booze)   SENNOSIDES-DOCUSATE SODIUM (SENOKOT-S) 8.6-50 MG TABLET    Take 1 tablet by mouth daily as needed. For constipation   TRAMADOL (ULTRAM) 50 MG TABLET    Take 1 tablet (50 mg total) by mouth every 6 (six) hours as needed for pain. As needed for pain  Modified Medications   Modified Medication Previous Medication   AMLODIPINE (NORVASC) 2.5 MG TABLET amLODipine (NORVASC) 2.5 MG tablet          TAKE ONE TABLET BY MOUTH ONCE DAILY   FUROSEMIDE (LASIX) 40 MG TABLET furosemide (LASIX) 40 MG tablet      Take 1 tablet (40 mg total) by  mouth daily. For swelling  Take 1 tablet (40 mg total) by mouth daily. For swelling  Discontinued Medications   AMLODIPINE (NORVASC) 2.5 MG TABLET    Take 2.5 mg by mouth daily. For blood pressure

## 2012-11-04 ENCOUNTER — Ambulatory Visit: Payer: Medicare Other | Admitting: Pulmonary Disease

## 2012-11-08 ENCOUNTER — Telehealth: Payer: Self-pay | Admitting: Pulmonary Disease

## 2012-11-08 MED ORDER — AZITHROMYCIN 250 MG PO TABS: ORAL_TABLET | ORAL | Status: DC

## 2012-11-08 NOTE — Telephone Encounter (Signed)
rx sent and pt is aware of recs.[name], CMA

## 2012-11-08 NOTE — Telephone Encounter (Signed)
Per SN---ok to change the omeprazole back to once daily and ok to call in zpak #1  Take as directed with no refills. thanks

## 2012-11-08 NOTE — Telephone Encounter (Signed)
I spoke with pt. She is requesting ZPAK. C/o cough w/ thick white phlem, sneezing, chills, nasal congestion, PND, feels bad x this weekend. Also she is wanting to know if she can go back to taking the omeprazole to QD instead of BID. Please advise thanks Last OV 10/27/12 Pending OV 12/13/12 Allergies  Allergen Reactions  . Fentanyl     REACTION: nausea

## 2012-11-25 ENCOUNTER — Other Ambulatory Visit: Payer: Self-pay | Admitting: *Deleted

## 2012-11-25 MED ORDER — ALPRAZOLAM 0.5 MG PO TABS: 0.5000 mg | ORAL_TABLET | Freq: Three times a day (TID) | ORAL | Status: DC | PRN

## 2012-11-25 NOTE — Telephone Encounter (Signed)
Refill requests received from pharmacy for xanax 0.5mg  take one tab three times a day as needed #90. Last refill was on 09-07-12. Last OV on 11-08-12. Please advise if ok to refill. Thanks.[name], CMA

## 2012-12-07 ENCOUNTER — Telehealth: Payer: Self-pay | Admitting: Pulmonary Disease

## 2012-12-07 NOTE — Telephone Encounter (Signed)
I spoke with pt. She is aware of SN recs. She voiced her understanding. She stated she will wait until she see's SN on Monday. She does not feel like she is that severe to go see GI. Nothing further was needed

## 2012-12-07 NOTE — Telephone Encounter (Signed)
Per SN---for the thick mucus---try mucinex 600 mg  2 po bid with plenty of fluids,.  And if she has blood in her stool--we will need to refer her to GI--Dr. Judie Grieve her appt with SN on 1/27.  thanks

## 2012-12-07 NOTE — Telephone Encounter (Signed)
I spoke with pt. She stated she is down to 140 lbs and not sure why she is losing weight. She is eating and her appetite is fine. She states she coughed up blood once last week and it was thick and lumpy, slightly bright red. Has not done this since then. Her stools are very dark but is not sure if it has blood in it or not. Her lower back feels like it is on fire--off and on all day. She has an upcoming appt 12/13/12. Feels like she needs to be seen before then. Please advise SN thanks Last OV 10/27/12 Allergies  Allergen Reactions  . Fentanyl     REACTION: nausea

## 2012-12-13 ENCOUNTER — Ambulatory Visit (INDEPENDENT_AMBULATORY_CARE_PROVIDER_SITE_OTHER): Payer: Medicare Other | Admitting: Pulmonary Disease

## 2012-12-13 ENCOUNTER — Encounter: Payer: Self-pay | Admitting: Pulmonary Disease

## 2012-12-13 VITALS — BP 118/60 | HR 81 | Temp 98.2°F | Ht 60.0 in | Wt 155.2 lb

## 2012-12-13 DIAGNOSIS — K5900 Constipation, unspecified: Secondary | ICD-10-CM

## 2012-12-13 DIAGNOSIS — K5730 Diverticulosis of large intestine without perforation or abscess without bleeding: Secondary | ICD-10-CM

## 2012-12-13 DIAGNOSIS — F411 Generalized anxiety disorder: Secondary | ICD-10-CM

## 2012-12-13 DIAGNOSIS — M1990 Unspecified osteoarthritis, unspecified site: Secondary | ICD-10-CM

## 2012-12-13 DIAGNOSIS — Z853 Personal history of malignant neoplasm of breast: Secondary | ICD-10-CM

## 2012-12-13 DIAGNOSIS — I519 Heart disease, unspecified: Secondary | ICD-10-CM

## 2012-12-13 DIAGNOSIS — M545 Low back pain, unspecified: Secondary | ICD-10-CM

## 2012-12-13 DIAGNOSIS — I10 Essential (primary) hypertension: Secondary | ICD-10-CM

## 2012-12-13 DIAGNOSIS — K219 Gastro-esophageal reflux disease without esophagitis: Secondary | ICD-10-CM

## 2012-12-13 DIAGNOSIS — R0602 Shortness of breath: Secondary | ICD-10-CM

## 2012-12-13 DIAGNOSIS — I872 Venous insufficiency (chronic) (peripheral): Secondary | ICD-10-CM

## 2012-12-13 DIAGNOSIS — M47812 Spondylosis without myelopathy or radiculopathy, cervical region: Secondary | ICD-10-CM

## 2012-12-13 DIAGNOSIS — K449 Diaphragmatic hernia without obstruction or gangrene: Secondary | ICD-10-CM

## 2012-12-13 NOTE — Patient Instructions (Signed)
Today we updated your med list in our EPIC system...    Continue your current medications the same...  Let's plan a follow up recheck in 2 months.Marland KitchenMarland Kitchen

## 2012-12-13 NOTE — Progress Notes (Signed)
Subjective:    Patient ID: Heather [name], female    DOB: 1918-04-06, 79 y.o.   MRN: [id number]  HPI 79 y/o BF here for a follow up visit... she has multiple medical problems including:  Dyspnea & Anxiety;  HBP;  Hx CP followed by DrBensimhon;  HH/ Gastritis;  Divertics/ Constip;  Functional Abd Pain;  Renal cyst followed by DrKimbrough;  Hx left breast [phone];  DJD/ CSpine spondy/ LBP/ CTS; and she has a cancer phobia...  ~  December 26, 2010:  add-on appt due to right side pain x4d she says> "I feel fine, BUT..." states the right post CWP is unbearable, sharp, worse w/ movement, & denies trauma... area over the right 11-12th ribs is tender to palp & reproduces her discomfort;  chest is clear & no rubs w/ O2 sat 98% RA;  last CXR 9/11 clear, some scarring, ectatic Ao, DJD sp;  she wants to relate this all to her prev complaints of Abd pain & swelling, left flank pain, etc (recall extensive evals w/ nothing signif found to explain her varied symptoms)...  we discussed rx w/ rest, heat, rib binder, Lidoderm patches, Vicodin Prn.  ~  February 10, 2011:  6wk ROV- she saw TP 3/16 w/ cough & mult somatic complaints trouble swallowing, throat closing, drainage, nasal congestion, "harking phlegm" etc;  CXR= stable, ectatic Ao, NAD;  Labs include BMet=wnl, BNP=110, UA=clear;  Rx w/ Zyrtek, Saline, Nasonex, Pepcid, Mucinex, & keep appt w/ DrStark for swallowing eval... Now c/o same constellation of symptoms more or less, never took the Zyrtek or Nasonex (too$$) & we discussed regimen w/ Zyrtek AM, Saline/ Mucinex during the day/ Flonase Qhs...    Dyspnea>  On Mucinex plus her anxiolytics (Alprazolam) but not taking regularly...    HBP>  On Coreg, Norvasc, Cozaar, Lasix & BP= 110/64 & well controlled, she thinks too many meds...    GI>  She has hx HH, gastritis, divertics, constip, & functional abd pain w/ mult extensive evaluations in the past;  On Omep20, Miralax, Senakot-S but she persists w/ c/o swelling &  "something wrong in my stomach it's getting bigger & bigger- I might have cancer"; last CT Abd 1/11 reviewed- divertics, otherw neg;  States her appetite is OK but weight down 12#;  C/o left side of abd hurts when she gets up to walk, no pain sitting or supine;  Offered additional meds (try Levsin or bentyl but she declines);  She has up coming appt w/ DrStark.    DJD>  Followed by DrWhitfield & she's had epid shots by DrNewton in the past    Anxiety>  On Alprazolam but she won't take it regularly for help w/ her severe anxiety & cancer phobia...  ~  June 12, 2011:  16mo ROV & she persists w/ mult somatic complaints & flight of ideas hx, "I have really suffered" she says...    GI> Hx HH, gastritis, divertics, & vague abd pains> she is not happy w/ DrStark's eval & sought second opinion (DrMann declined to see her); she saw DrJEdwards & his notes are reviewed;  Today she is quite distraught about papers to fill out for the GI dept at [facility]- DrNThorne...    Ortho> c/o pain in left leg w/ some edema (she has VI & dependent edema) & states her legs feel like lead; exam shows some incr in LE edema & we discussed incr in her Lasix from 20mg  to 40mg /d plus a 2gm sodium diet.Marland KitchenMarland Kitchen  We rechecked her labs today> all WNL.Marland Kitchen.  ~  July 09, 2011:  24mo ROV & add-on due to sm area of dermatitis on her left leg, sl excoriated & sm scab==> her son is here today & told me she drove him crazy w/ her ruminations about this being a cancer & that we would send her to the cancer center, etc (she got angry at him for telling me); we discussed dermatitis & rec RX w/ LIDEX-E cream to apply Bid & rec Derm review if not resolved...    She is also quite concerned about a "bump" on the back of her head==> occipital bone & it's symmetric, she is reassured...    She has been to [facility] GI Dept DrThorne & note is pending but she has lab sheet (all wnl) & is sch for EGD 9/12; they told her to stop Metamucil, and take Miralax, Senakot; she is most  concerned/ upset by an evaluation form she received asking questions like how long she waited for this or that during her visit & she doesn't know what to do or how to complete the info! I encouraged her to take her Alpraz regularly Tid...  ~  September 22, 2011:  2.212mo ROV & she states "terrible" c/o phlegm, swelling, feeling bad, wt loss, "this throat business- they found white balls"; very anxious w/ mult somatic complaints >>    HBP> on Norvasc2.5, Coreg3.125Bid, Losartan50, Lasix20-40/d; she went to the ER w/ ?angioedema of the tongue, seen by [facility] who called me & we decided to stop her Cozaar; BP today off Cozaar & on the other 3 meds showed 122/72> rec to continue the same...    GI> she saw DrThorne at [facility] 9/12 for a comprehensive GI eval> note reviewed- they did an EGD 08/06/11 w/ mult white nodular plaques seen in cardia & fundus- Bx'd & pending (she is very concerned);  she went to the ER 08/16/11 w/ abd discomfort- the same vague left side abd discomfort that she has seen mult physicians about- Exam showed mild diffuse tenderness;  they did CT Abd showing divertics, no inflamm, right renal & left hep lobe cysts, atherosclerotic changes, NAD;  Labs & urine were essentially WNL;  EKG showed NSR, RBBB & LAD, no acute changes...  she saw DrEdwards [facility]) 10/12 w/ mult complaints including "a knot on her skull & she feels that her brains are coming out"; he thought she was doing reasonably well from the GI standpoint w/ her dysphagia, vague abd pain, & constip...    Ortho> she saw DrWhitfield w/ MRI Lumbar spine 10/12 showing degenerative changes w/ mild progression since 7/10 MRI, mild-mod sp stenosis & lat recess stenosis at several levels; she has Vicodin to take up to 3/d as needed for pain; she is anxiously awaiting f/u visit w/ DrWhitfield...    Derm> DrLupton saw pt 07/23/11 w/ removal of sm squamous cell ca (in situ) from left lower leg  ~  December 29, 2011:  12mo ROV & [name] has her  usual complement of somatic complaints> all seem stable w/o appreciable change; she does not want to see DrJEdwards again, but is happy w/ eval & communication from DrThorne at [facility] says her abd pain is coming from the arthritis in her back, & she has seen DrWhitfield for that... She lists mult OTC GI meds including> Prilosec, Miralax, Metamucil, Senakot, Mylicon...    BP controlled on her Coreg, Amlodipine, Lasix; BP= 120/68 today & denies CP/ palpit, ch in DOE,  edema, etc...    She saw DrThorne recently for f/u & note pending; prev note 11/12 reviewed> she has done a thorough investigation...    She saw DrWhitfield 12/12 for f/u LBP w/ known DJD & severe sp stenosis L4-5 & neurogenic claudication; she did PT & has a brace, he referred her to [facility] for shots...  ~  Mar 29, 2012:  6mo ROV & she continues w/ mult complaints- ran out of Dymista sample for nasal symptoms (didn't help), thinks her BP is too low, c/o swelling (has VI- reminded to elim sodium, wear support hose, elevate, take Lasix40), "constip" but stool is loose & soft (rec- Align), saw DrEdwards again for GI- she denies dysphagia, still c/o chr abd discomfort, etc; c/o "dark urine" it was clear and C&S was neg; she requires constant reassurance about her condition & that she does not have cancer; asked to take her Xanax more regularly...  We reviewed prob list, meds, xrays and labs>  See below>>   ~  June 28, 2012:  6mo ROV & Analicia continues to have mult somatic complaints & says "terrible, terrible, terrible" states she's lost all the muscles in her abd & now uses a girdle; "all my doctors say for me to see you" eg> DrEdwards, DrNishan, etc; she still believes that she has cancer "somewhere- just like my friend"... Then she launches into a flight of ideas of complaints> carpel tunnel symptoms (she wants to f/u w/ DrWhtfield), cough/ phlegm, dizzy, "it's my stomach", etc...    We reviewed prob list, meds, xrays and labs> see below for  updates >>  ~  August 03, 2012:  97mo ROV & add-on at pt's insistance for "hocking- it's not a cough, it's a hock"; then she proceeds to clear her throat forcefully & "hock-up" some clear phlegm; she notes "thick white foamy slimy sputum w/ flecks of chopped meat in it"; she denies cough/ hemoptysis/ fcs, etc; she notes some mild dyspnea (at baseline) and occas CP from the force of the hocking; she is quite insistent on additional work-up & Rx for this perceived problem:     Exam is clear- no oral lesions, neck is neg, chest is clear...    CXR shows sl overinflation, clear lungs, normal heart size w/ elongation & tortuous Ao...    PFTs are wnl> FVC=2.62 (128%), FEV1=2.20 (158%), %1sec=84; mid-flows=274% of predicted...    Note> she is quite anxious & has a cancer phobia; again asked to take her Alprazolam Tid regularly... We reviewed prob list, meds, xrays and labs> see below for update >> We decided on a trial regimen to help her "hocking" w/ short term f/u to assess efficacy> MUCINEX 600mg - 2Bid w/ lots of fluids; HYCODAN syrup- 1tspQid; DULERA100/5- 1puffBid then rinse w/ Listerine... Note> before completing her eval today she insisted in a right breast exam stating that DrBertrand insisted that her PCP examine her breast at every visit> it was neg.  ~  August 30, 2012:  97mo ROV & recheck> "I'm just falling apart, meds not helping, Dulera choked me up, mouth glued together" therefore she stopped the [facility] & does better on no Rx she says "there is something wrong w/ me, I'm losing so much weight, I'm skin & bones" wt today= 156# which is up 8# from last visit; she is c/o snorting phlegm that she says tastes bitter & has little white balls in it (she could not produce any sput despite a lot of hocking in the office today (advised to  continue Mucinex600mg -2Bid w/ fluids)......  Also c/o left lat CWP, tender ribs & wears lumbar brace/ rib binder, +takes Hydrocodone/ Tramadol as needed...     We  reviewed prob list, meds, xrays and labs> see below for updates >> she declines the 2013 flu vaccine... LABS 10/13:  Chems-wnl;  CBC- wnl;  TSH= 1.79  ~ September 27, 2012: 58mo ROV & Doneen is rambling on w/ 'flight of ideas" today & due to this we tried a systems approach & touched on all these problems during her visit today>>     Resp symptoms> c/o SOB, hocking phlegm, "this throat business" for which she has seen DrRosen, ENT; now c/o decr hearing, thick phlegm, etc; We reviewed CXR 10/13- clear & wnl; PFT 10/13- normal airflow; Rec- Mucinex600mg -2Bid, lots of fluids, & f/u w/ ENT for hearing eval...     HBP> on Coreg3.125Bid, Amlod2.5, Lisin40; BP= 120/64 & we reviewed labs from 10/13- all wnl...     HxCP> on ASA81; followed by DrBensimhon/ Walker Kehr for Cards & their notes are reviewed=> neg ischemic work up...     VI, Edema> she went to the ER 11/13 w/ edema; VDopplers were neg for DVT; they reiterated the recs for no salt, elevation, support hose, & continue Lasix40.     GI- HH, Gastirits, Divertics, chronic pain & "swelling"> on Omep20- taking 2/d & we discussed derc to one daily; plus Miralax & Senakot-S...     Renal cyst> she is very anxious about everything & has a cancer phobia; DrKimbrough followed this benign simple cyst for yrs- no change...     Hx Breast cancer> she had left mod radical mastectomy 1994 by DrBlievernicht- no known recurrence & she is given reassurance...     Ortho- DJD, Cspine spongy, LBP, CTS> followed by DrWhitfield; on Vicodin & Tramadol but prefers the latter she says...     Anxiety> on Alpraz0.5mg Tid but "I'm not taking it very often" she says & encouraged to use more regularly...  We reviewed prob list, meds, xrays and labs> see below for updates >>   ~  October 27, 2012:  58mo ROV & Anelle has mult somatic complaints as usual- dry mouth at night, hocking thick phlegm, c/o stomach/ back/ etc... Again we reviewed the following problems during today's visit>>     Resp symptoms> on Mucinex2Bid +Fluids; she saw DrRosen, ENT 11/13- dysphagia, presbycusis, referral otalgia; exam was normal, barium swallow 11/13 showed diminished primary peristaltic wave, mild tertiary contractions, prominent cricopharyngeus muscle, no hiatal hernia/ no reflux/ barium pill passed into the stomach without delay...    HBP> on Coreg3.125Bid, Amlod2.5, Lisin40; BP= 120/60 & she persists w/ mult somatic complaints...    HxCP> on ASA81; followed by DrBensimhon/ DrNishan for Cards & their notes are reviewed=> neg ischemic work up previously...     VI, Edema> she went to the ER 11/13 w/ edema; VDopplers were neg for DVT; they reiterated the recs for no salt, elevation, support hose, & continue Lasix40.     GI- HH, Gastirits, Divertics, chronic pain & "swelling"> on Omep20mg /d plus Miralax & Senakot-S; she states "my stomach has gone to my back".    Renal cyst> she is very anxious about everything & has a cancer phobia; DrKimbrough followed this benign simple cyst for yrs- no change...     Hx Breast cancer> she had left mod radical mastectomy 1994 by DrBlievernicht- no known recurrence & she is given reassurance...     Ortho- DJD, Cspine spondy, LBP, CTS> followed by DrWhitfield &  he injected her right knee 1wk ago; on Vicodin & Tramadol but prefers the latter she says...     Anxiety> on Alpraz0.5mg Tid but "I'm not taking it very often" she says & encouraged to use more regularly...  We reviewed prob list, meds, xrays and labs> see below for updates >>   ~  December 13, 2012:  6wk ROV & Wilmer had a good holiday but launches into her usual litany of complaints> feels bad in the mornings, legs sore "they get hot", "I get hot in my back", c/o "hocking up little white balls of phlegm", stools are dark & small, etc... She thinks she needs massage for her back problems & has seen DrWhitfield w/ PT recommended... She is rec to increase Mucinex 600mg -2Bid w/ Fluids; Not currently c/o dyspnea; BP well  controlled on meds; She has chronic GI complaints managed by DrStark & she had no specific GI complaints today; She has Vicodin & Tramadol for pain; She has Therapist, sports for anxiety & rec (again) to take it more regularly...     We reviewed prob list, meds, xrays and labs> see below for updates >>           Problem List:  RESPIRATORY SYMPTOMS >> c/o "hocking phlegm" DYSPNEA (ICD-786.9) - eval 5/11 c/o SOB- can't get a DB, can't get enough air in, etc... occurs at rest & w/ exerc "I'm very active despite my arthritis", talks rapidly in long sentences w/o apparent distress... no cough, phlegm, hemoptysis, etc- but as usual she is quite distressed by these symptoms and wants further eval> we discussed re-assessment w/ CXR (clear, NAD);  PFT (totally norm airflow);  Labs (all WNL);  Rx w/ ALPRAZOLAM 0.5mg  Tid & encouraged to take regularly. ~  CXR 4/13 showed norm heart size, clear lungs, mild TSpine spondylosis... ~  She continues to complain of drainage, throat symptoms, etc; Rx w/ Mucinex, OTC antihist, Nasonex, etc;  Seen by ENT, DrRosen w/ ?xerostomia & rec incr water etc. ~  9/13: Add-on visit for "hocking, it's not a cough, it's a hock"> see above... ~  9/13: CXR shows sl overinflation, clear lungs, normal heart size w/ elongation & tortuous Ao;  PFTs are wnl> FVC=2.62 (128%), FEV1=2.20 (158%), %1sec=84; mid-flows=274% of predicted... We discussed trial rx for her symptoms- Mucinex, Fluids, Hycodan, Dulera100- 1puffbid. ~  10/13: she reports INTOL to [facility] w/ throat & mouth symptoms "like glue" therefore stopped this med; advised to continue the others.  HYPERTENSION (ICD-401.9) - on AMLODIPINE 2.5mg /d,  COREG 3.125mg Bid,  LASIX 40mg - taking 1/2tab/d because 1 tab makes her too weak...  ~  7/12:  C/o incr edema in legs & advised to elim sodium from diet & incr her LASIX to 40mg /d... ~  10/12:  Prev Cozaar 50mg  was stopped 10/12 via the ER when she presented w/ ?angioedema of the tongue... ~  2/13:   BP= 120/68 & doing well on 3 med regimen & off Losartan. ~  5/13:  BP= 110/60 & she has mult somatic complaints but denies HA, CP, palpit, ch in SOB or edema... ~  8/13:  BP= 112/58 & there is no change in her mult somatic complaints... ~  9/13:  BP= 100/60 on Coreg3.125Bid & Amlod2.5 & prn Lasix... ~  10/13:  BP= 120/58 & she has mult somatic complaints as before... ~ 11/13: on Coreg3.125Bid, Amlod2.5, Lisin40; BP= 120/64 & we reviewed labs from 10/13- all wnl...  ~  12/13: on Coreg3.125Bid, Amlod2.5, Lisin40; BP= 120/60 & she persists w/ mult  somatic complaints...  Hx of CHEST PAIN (ICD-786.50) - she takes ASA 81mg /d + Tylenol Prn... followed by DrBensimhon for Cards. ~  Baseline EKG w/ RBBB & LAD, no acute changes... ~  NuclearStressTest done 2007 & 2009 showed normal- without scar or ischemia, EF=67%...   ~  2DEcho 7/08 showed mild AI, mild diastolic dysfunction, EF=55-60%. ~  CT Abd 1/09 & 9/12 showed incidental coronary calcif, & aorto-iliac atherosclerotic changes as well... ~  8/13: she saw DrNishan> HBP, DD, atypCP w/ neg Myoviews & no changes made... ~  10/13:  She uses a back brace/ abd binder to help her CWP...  VENOUS INSUFFICIENCY (ICD-459.81) - she has mod VI and follows a low sodium diet, elevates legs when able, and wears support hose. ~  7/12:  C/o incr edema & we reviewed a 2gm sodium diet & increased her Lasix from 20mg /d to 40mg /d... ~  5/13:  She has persist complaints but mild chr VI w/ min edema & reminded to elim sodium, elev, support hose, take the Lasix... ~ 11/13: she went to the ER w/ edema; VDopplers were neg for DVT; they reiterated the recs for no salt, elevation, support hose, & continue Lasix40.  HIATAL HERNIA (ICD-553.3) & GASTRITIS (ICD-535.50) - on OMEPRAZOLE 20mg /d & followed by DrJEdwards for GI having fired DrStark & [facility] in past. ~  last EGD was 2/08 w/ 3cm HH & gastitis... ~  CT Abd 1/09 revealed calcif gallstones, & mild ectasia of AA... ~  CT Abd  1/11 showed left colon divertics, tiny hepatic lesions w/o change & right renal cyst stable. ~  CT Abd 9/12 showed divertics (no inflamm), right renal & left hep lobe cysts, atherosclerotic changes, NAD... ~  Seen by DrEdwards 4/13> c/o dysphagia & congestion; he notes mult GI complaints, known esoph dysmotility w/o stricture, hard to pin down her complaints...  DIVERTICULOSIS, COLON (ICD-562.10) - followed regularly by DrStark. ~  colonoscopy 7/03 by DrStark showing divertics only... we discussed Miralax + Senakot-S for constipation. ~  colonoscopy 2/11 showed divertics, 3mm polyp, hems...  ? of ABDOMINAL PAIN & SYMPTOMATOLOGY - she had a very thorough work up in 2009> she has a cancer phobia and is very difficult to reassure her that everything appropriate has been done & no cancer found... Mult additional GI evals since then from [facility], [facility] (DrEdwards), [facility] (DrThorne)... ~  eval by GI- DrStark 12/08 but pt wasn't satisfied w/ his examination... ~  full labs 1/09 were WNL, sed=20... ~  CT Abd/ Pelvis 1/09= no acute abn in abd (calcif gallstones & mild ectasia of AA), calcif fibroid in the pelvis, & ?regarding the left ovary. ~  referral to DrFontaine, GYN> his notes are reviewed... ~  follow up w/ DrKimbrough, Urology> his notes are reviewed... ~  eval DrWhitfield for Orthopedics- rec shots in back by [facility] w/ some improvement. ~  repeat GI eval 1/11 by DrStark w/ CT & colonoscpy as above- no acute problems. ~  5/11 & 7/11 >> persist complaints w/ neg recheck by DrStark & DrKimbrough... ~  12/11:  routine GYN surveillance DrFontaine found anteverted uterus w/ fibroids, atrophic ovaries bilat, no mass. ~  Repeat GI eval 4/12 by DrStark> he tried to reassure the pt, felt some symptoms might be from poor abd wall muscle tone, rec continue Omep, laxatives... ~  6/12:  She sought another GI opinion DrEdwards> he did not feel she needed any additional testing... ~  She tells me she  has yet another  appt to see a gastroenterologist at [facility]==> seen by DrThorne 8/12 & note reviewed... ~  EGD by DrThorne 9/12 w/ mult whitish nodular plaques in cardia & fundus> Bx results pending & she is very concerned (remember her hx cancer phobia). ~  She continues to f/u w/ DrThorne at [facility] & is happy w/ her eval...  RENAL CYST (ICD-593.2) - she has a 1 cm right renal cyst seen on CT in 2007 and followed by DrKimbrough... f/u CT Abd 1/09 - no change in the cyst... f/u renal ultrasound 1/10 w/ small bilat cysts... f/u CT Abd 1/11- no change in cyst. ~  she saw DrKimbrough 7/11 for LBP, microhematuria, urge incontinence- stable, no additional Rx. ~  she saw DrKimbrough 1/12 for urge incontinence, freq, nocturia> rec Desmopressin 0.2mg - 1/2 tab Qhs.  CARCINOMA, BREAST, LEFT (ICD-174.9) - Surgery 9/94 by DrBlievernicht w/ Left modified radical mastectomy... +estrogen receptors and Rx w/ Tamoxifen... f/u mammograms all neg... breast exam on right has been neg- no nodules palpated... She says that DrBertrand insisted that her PCP check her right breast at each & every office visit to be sure she doesn't develop any nodules...  DEGENERATIVE JOINT DISEASE (ICD-715.90) - on OTC meds + VICODIN Prn... she has end-stage OA of Right Knee per DrWhitfield treated w/ shots in the past... she has a knee brace and a cane for ambulation... ~  8/13:  She wants a better pain pill & we discussed change to [facility] 5/325 Tid prn... ~  12/13: she tells me that DrWhitfield injected her knee last wk- improved...  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 Rx w/ rest, heat, Percocet, and f/u w/ Ortho...  Hx of LOW BACK PAIN, CHRONIC (ICD-724.2) - eval DrWhitfield w/ rec for shots by DrNewton & she's feeling sl better & wears back brace... ~  5/10: mult somatic complaints- primarily c/o left side/ postero-lat rib pain...  Exam w/ tender left lat/  post ribs... Bone Scan= neg... ~  MRI Lumbar spine 7/10 by DrWhitfield... ~  Epidural steroid shot by DrNewton 8/10 & 1/11 ~  Repeat LBP eval by DrWhitfield w/ another MRI lumbar spine showing degenerative changes w/ mild progression since 7/10 MRI, mild-mod sp stenosis & lat recess stenosis at several levels ~  DrWhitfield referred her to [facility] to consider more shots but she is not so inclined... ~  6/13: she saw DrTooke w/ lumbar spondylosis; he felt that he had nothing to offer her & rec incr Tramadol to Qid...  CARPAL TUNNEL SYNDROME, BILATERAL (ICD-354.0) - she had prev right carpal tunnel release by DrWhitfield and was c/o increasing discomfort in her left wrist despite wrist splint Qhs- had left carpal tunnel release 1/10 by DrWhitfield...  ANXIETY (ICD-300.00) - on ALPRAZOLAM 0.5mg  Tid & encouraged to take it regularly... she has a cancer phobia & it is very difficult to reassure her regarding her symptoms and our evaluations... ~  7/13: she went to the ER c/o swelling of the tongue but eval by EDP indicated that her tongue was normal & not swollen, she was not on an ACE, given Pred Rx anyway.   Past Surgical History  Procedure Date  . Left mastectomy   . Carpal tunnel release     Outpatient Encounter Prescriptions as of 12/13/2012  Medication Sig Dispense Refill  . ALPRAZolam (XANAX) 0.5 MG tablet Take 1 tablet (0.5 mg total) by mouth 3 (three) times daily as needed. For anxiety  90 tablet  5  . amLODipine (NORVASC) 2.5 MG tablet Take 2.5 mg by mouth daily.       Marland Kitchen aspirin EC 81 MG tablet Take 81 mg by mouth daily.      . carvedilol (COREG) 3.125 MG tablet Take 3.125 mg by mouth 2 (two) times daily with a meal. For blood pressure      . furosemide (LASIX) 40 MG tablet Take 1 tablet (40 mg total) by mouth daily. For swelling  30 tablet  6  . guaiFENesin (MUCINEX) 600 MG 12 hr tablet Take 1,200 mg by mouth 2 (two) times daily. For congestion      . HYDROcodone-acetaminophen  (NORCO) 5-325 MG per tablet Take 1/2 to 1 tablet by mouth three times daily as needed for pain  90 tablet  5  . Multiple Vitamins-Minerals (MULTIVITAMINS THER. W/MINERALS) TABS Take 1 tablet by mouth daily.      Marland Kitchen omeprazole (PRILOSEC) 20 MG capsule Take 1 by mouth 30 min before the first meal of the day      . polyethylene glycol (MIRALAX / GLYCOLAX) packet Take 17 g by mouth daily as needed. For constipation      . PRESCRIPTION MEDICATION Place 1 drop into both eyes daily. Eye drops (samples were given to patient by Dr. Dione Booze)      . sennosides-docusate sodium (SENOKOT-S) 8.6-50 MG tablet Take 1 tablet by mouth daily as needed. For constipation      . traMADol (ULTRAM) 50 MG tablet Take 1 tablet (50 mg total) by mouth every 6 (six) hours as needed for pain. As needed for pain  30 tablet  5  . Cholecalciferol (VITAMIN D3) 1000 UNITS CAPS Take 1 capsule by mouth daily. For your bones      . [DISCONTINUED] azithromycin (ZITHROMAX) 250 MG tablet As directed  6 each  0    Allergies  Allergen Reactions  . Fentanyl     REACTION: nausea    Current Medications, Allergies, Past Medical History, Past Surgical History, Family History, and Social History were reviewed in [employer] record.    Review of Systems      See HPI - all other systems neg except as noted... She has mult somatic complaints and anxiety. The patient complains of decreased hearing, chest discomfort, dyspnea on exertion, abdominal pain, incontinence, muscle weakness, and difficulty walking.  The patient denies anorexia, fever, vision loss, hoarseness, syncope, peripheral edema, prolonged cough, headaches, hemoptysis, melena, hematochezia, severe indigestion/heartburn, hematuria, transient blindness, depression, abnormal bleeding, enlarged lymph nodes, and angioedema.     Objective:   Physical Exam   WD, WN, 79 y/o BF in NAD... she is very anxious... GENERAL:  Alert & oriented; pleasant &  cooperative... HEENT:  [place]/AT, EOM-wnl, PERRLA, EACs-clear, TMs-wnl, NOSE-clear, THROAT-clear & wnl, no lesions seen. NECK:  Neck ROM decreased, no JVD; normal carotid impulses w/o bruits; no thyromegaly or nodules palpated; no lymphadenopathy. CHEST:  Clear to P & A; without wheezes/ rales/ or rhonchi heard... + tender right lat 11th & 12th ribs on palp. BREAST:  s/p left mastectomy... right breast normal- without nodules palpated... HEART:  Regular Rhythm; without murmurs/ rubs/ or gallops detected... ABDOMEN:  Soft & nontender; normal bowel sounds; no organomegaly or masses palpated... EXT: without deformities, mod arthritic changes; no varicose veins/ +venous insuffic/ tr edema. NEURO:  CN's intact; no focal neuro deficits... DERM:  Small sl excoriated area of dermatitis left shin- no drainage,no rash, etc...  RADIOLOGY DATA:  Reviewed in  the EPIC EMR & discussed w/ the patient...  LABORATORY DATA:  Reviewed in the EPIC EMR & discussed w/ the patient...   Assessment & Plan:    RESPIRATORY SYMPTOMS w/ Dyspnea & throat clearing>  She had a neg pulm eval 2011 as above; she insisted on additional eval 9/13 for her chr throat clearing symptom; CXR w/o acute changes & PFTs normal for her 79 y/o lungs; she is quite insistent on med rx for her problem & we reviewed Mucinex, Hycodan, Dulera, & Listerine (she was intol to the [facility]); pt very anxious & Alprazolam seems to help when she takes it...  HBP>  Controlled on low doses of several meds including Coreg, Norvasc & Lasix;  She is encouraged to take meds regularly...  Hx CP> evaluated for Cards by DrBensimon & he has not been in favor of invasive testing; stable on conservative med rx...  VI/ Edema>  She is concerned about incr edema & we reviewed 2gm sodium diet & rec keep Lasix at 40mg /d...  GI> HH, Gastritis, Divertics, vague abd pain>  I tried once again to reassure her about the thorough GI evaluations that her has undergone; she is  happy w/ DrThorne's eval & will f/u w/ her & in the meanwhile continue rx w/ Prilosec40, Miralax, Metamucil, Senakot, Mylicon, etc...  Renal cyst>  This is benign & has been followed by DrKimbrough...  Hx left Breast Cancer>  No known recurrence...  DJD> Cervical spondylosis & chronic LBP>  She has been evaluated by DrWhitfield for Ortho & given epid steroid shots by DrNewton... She wants a better pain pill & we tried [facility] 5/325 tid as needed but she prefers the Tramadol; knee shots from DrWhitfield helped...  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  Anxiety>  Asked once again to increase the Alprazolam to 1/2 - 1 tab TID regularly...   Patient's Medications  New Prescriptions   No medications on file  Previous Medications   ALPRAZOLAM (XANAX) 0.5 MG TABLET    Take 1 tablet (0.5 mg total) by mouth 3 (three) times daily as needed. For anxiety   AMLODIPINE (NORVASC) 2.5 MG TABLET    Take 2.5 mg by mouth daily.    ASPIRIN EC 81 MG TABLET    Take 81 mg by mouth daily.   CARVEDILOL (COREG) 3.125 MG TABLET    Take 3.125 mg by mouth 2 (two) times daily with a meal. For blood pressure   CHOLECALCIFEROL (VITAMIN D3) 1000 UNITS CAPS    Take 1 capsule by mouth daily. For your bones   FUROSEMIDE (LASIX) 40 MG TABLET    Take 1 tablet (40 mg total) by mouth daily. For swelling   GUAIFENESIN (MUCINEX) 600 MG 12 HR TABLET    Take 1,200 mg by mouth 2 (two) times daily. For congestion   HYDROCODONE-ACETAMINOPHEN (NORCO) 5-325 MG PER TABLET    Take 1/2 to 1 tablet by mouth three times daily as needed for pain   MULTIPLE VITAMINS-MINERALS (MULTIVITAMINS THER. W/MINERALS) TABS    Take 1 tablet by mouth daily.   OMEPRAZOLE (PRILOSEC) 20 MG CAPSULE    Take 1 by mouth 30 min before the first meal of the day   POLYETHYLENE GLYCOL (MIRALAX / GLYCOLAX) PACKET    Take 17 g by mouth daily as needed. For constipation   PRESCRIPTION MEDICATION    Place 1 drop into  both eyes daily. Eye drops (samples were given  to patient by Dr. Dione Booze)   SENNOSIDES-DOCUSATE SODIUM (SENOKOT-S) 8.6-50 MG TABLET    Take 1 tablet by mouth daily as needed. For constipation   TRAMADOL (ULTRAM) 50 MG TABLET    Take 1 tablet (50 mg total) by mouth every 6 (six) hours as needed for pain. As needed for pain  Modified Medications   No medications on file  Discontinued Medications   AZITHROMYCIN (ZITHROMAX) 250 MG TABLET    As directed

## 2012-12-22 ENCOUNTER — Telehealth: Payer: Self-pay | Admitting: Pulmonary Disease

## 2012-12-22 DIAGNOSIS — N6310 Unspecified lump in the right breast, unspecified quadrant: Secondary | ICD-10-CM

## 2012-12-22 NOTE — Telephone Encounter (Signed)
Called, spoke with pt.  Pt states she received a letter stating her mammogram was due in March.  She called [facility] to schedule this now because she has noticed lumps in her right breast.  Pt states she was advised that bc it is not time for her mammogram yet, she would need to be seen for this.   Pt is requesting to see Dr. Kriste Basque.  States that she does not have a GYN.  Dr. Kriste Basque, pls advise if you would like to work pt in or have any further recs.  Thank you.

## 2012-12-23 NOTE — Telephone Encounter (Signed)
No appt needed with SN---we can order her mammogram to eval the lumps in her breast. thanks

## 2012-12-23 NOTE — Telephone Encounter (Signed)
Patient aware we will send an order for her to get her mammogram before March 2014 yearly appt due to lump in her right breast. Order sent to [facility].

## 2013-01-06 ENCOUNTER — Telehealth: Payer: Self-pay | Admitting: Pulmonary Disease

## 2013-01-06 NOTE — Telephone Encounter (Signed)
Per SN---  1.  Use the mucinex otc 600 mg   2 po bid with plenty of fluids  2.  Tonic water  1 glass at bedtime.  i have called and lmomtcb for the pt to make her aware.

## 2013-01-06 NOTE — Telephone Encounter (Signed)
Called, spoke with pt.  Has 2 concerns: 1.  C/o congestion that feels like it is in her throat.  Reports it is thick, white phelgm and about "once a month" has a small "lump" of med red blood mixed in.  States this has been doing on x couple of months but is getting worse.  Denies PND, coughin, wheezing, or fever but does c/o "burning up."   2.  C/o right leg jerking and waking her up qhs x 3-4 days.  States the right side of this leg is also sore.    Pt is requesting to come in to see SN before something is called in if possible.  She wonders if xrays need to be done.  Dr. Kriste Basque, pls advise.  Thank you.

## 2013-01-07 ENCOUNTER — Encounter: Payer: Self-pay | Admitting: Pulmonary Disease

## 2013-01-07 ENCOUNTER — Ambulatory Visit (INDEPENDENT_AMBULATORY_CARE_PROVIDER_SITE_OTHER): Payer: Medicare Other | Admitting: Pulmonary Disease

## 2013-01-07 VITALS — BP 116/58 | HR 75 | Temp 98.2°F | Ht 60.0 in | Wt 145.2 lb

## 2013-01-07 DIAGNOSIS — R6889 Other general symptoms and signs: Secondary | ICD-10-CM

## 2013-01-07 DIAGNOSIS — R059 Cough, unspecified: Secondary | ICD-10-CM

## 2013-01-07 DIAGNOSIS — I10 Essential (primary) hypertension: Secondary | ICD-10-CM

## 2013-01-07 DIAGNOSIS — K5730 Diverticulosis of large intestine without perforation or abscess without bleeding: Secondary | ICD-10-CM

## 2013-01-07 DIAGNOSIS — R05 Cough: Secondary | ICD-10-CM

## 2013-01-07 DIAGNOSIS — I519 Heart disease, unspecified: Secondary | ICD-10-CM

## 2013-01-07 DIAGNOSIS — Z853 Personal history of malignant neoplasm of breast: Secondary | ICD-10-CM

## 2013-01-07 DIAGNOSIS — R5381 Other malaise: Secondary | ICD-10-CM

## 2013-01-07 DIAGNOSIS — R5383 Other fatigue: Secondary | ICD-10-CM

## 2013-01-07 DIAGNOSIS — F411 Generalized anxiety disorder: Secondary | ICD-10-CM

## 2013-01-07 DIAGNOSIS — I872 Venous insufficiency (chronic) (peripheral): Secondary | ICD-10-CM

## 2013-01-07 DIAGNOSIS — M1990 Unspecified osteoarthritis, unspecified site: Secondary | ICD-10-CM

## 2013-01-07 DIAGNOSIS — K219 Gastro-esophageal reflux disease without esophagitis: Secondary | ICD-10-CM

## 2013-01-07 MED ORDER — GABAPENTIN 100 MG PO CAPS: ORAL_CAPSULE | ORAL | Status: DC

## 2013-01-07 MED ORDER — HYDROCODONE-ACETAMINOPHEN 5-325 MG PO TABS: ORAL_TABLET | ORAL | Status: DC

## 2013-01-07 NOTE — Telephone Encounter (Signed)
Spoke with the pt and notified of recs per SN She states that she is already taking mucinex 2 bid with plenty of fluids and this has not helped at all She states that she is coughing up large amounts of thick, sticky white sputum She is also c/o loosing wt without trying and this is another concern for her  SN had an opening at 12 noon and she is scheduled to see him then

## 2013-01-07 NOTE — Patient Instructions (Addendum)
Today we updated your med list in our EPIC system...    Continue your current medications the same...  Today we refilled your Hydrocodone pain med- you may take one tab up to 3 times daily as needed for pain...  For the congestion> take the [facility] 600mg - 2 tabs twice daily w/ lots of fluids...  For the leg symptoms>    Take a glass of TONIC water at bedtime...    Try the new GABAPENTIN 100mg - one or two tabs at bedtime...  Finally add in a nutritional supplement> ENSURE, Sustacal, Boost, etc & take 1-2 cans daily to gain some weight.Marland KitchenMarland Kitchen

## 2013-01-09 ENCOUNTER — Encounter: Payer: Self-pay | Admitting: Pulmonary Disease

## 2013-01-09 NOTE — Progress Notes (Signed)
Subjective:    Patient ID: Heather [name], female    DOB: 1918/04/17, 79 y.o.   MRN: [id number]  HPI 79 y/o BF here for a follow up visit... she has multiple medical problems including:  Dyspnea & Anxiety;  HBP;  Hx CP followed by DrBensimhon;  HH/ Gastritis;  Divertics/ Constip;  Functional Abd Pain;  Renal cyst followed by DrKimbrough;  Hx left breast [phone];  DJD/ CSpine spondy/ LBP/ CTS; and she has a cancer phobia...  ~  December 26, 2010:  add-on appt due to right side pain x4d she says> "I feel fine, BUT..." states the right post CWP is unbearable, sharp, worse w/ movement, & denies trauma... area over the right 11-12th ribs is tender to palp & reproduces her discomfort;  chest is clear & no rubs w/ O2 sat 98% RA;  last CXR 9/11 clear, some scarring, ectatic Ao, DJD sp;  she wants to relate this all to her prev complaints of Abd pain & swelling, left flank pain, etc (recall extensive evals w/ nothing signif found to explain her varied symptoms)...  we discussed rx w/ rest, heat, rib binder, Lidoderm patches, Vicodin Prn.  ~  February 10, 2011:  6wk ROV- she saw TP 3/16 w/ cough & mult somatic complaints trouble swallowing, throat closing, drainage, nasal congestion, "harking phlegm" etc;  CXR= stable, ectatic Ao, NAD;  Labs include BMet=wnl, BNP=110, UA=clear;  Rx w/ Zyrtek, Saline, Nasonex, Pepcid, Mucinex, & keep appt w/ DrStark for swallowing eval... Now c/o same constellation of symptoms more or less, never took the Zyrtek or Nasonex (too$$) & we discussed regimen w/ Zyrtek AM, Saline/ Mucinex during the day/ Flonase Qhs...    Dyspnea>  On Mucinex plus her anxiolytics (Alprazolam) but not taking regularly...    HBP>  On Coreg, Norvasc, Cozaar, Lasix & BP= 110/64 & well controlled, she thinks too many meds...    GI>  She has hx HH, gastritis, divertics, constip, & functional abd pain w/ mult extensive evaluations in the past;  On Omep20, Miralax, Senakot-S but she persists w/ c/o swelling &  "something wrong in my stomach it's getting bigger & bigger- I might have cancer"; last CT Abd 1/11 reviewed- divertics, otherw neg;  States her appetite is OK but weight down 12#;  C/o left side of abd hurts when she gets up to walk, no pain sitting or supine;  Offered additional meds (try Levsin or bentyl but she declines);  She has up coming appt w/ DrStark.    DJD>  Followed by DrWhitfield & she's had epid shots by DrNewton in the past    Anxiety>  On Alprazolam but she won't take it regularly for help w/ her severe anxiety & cancer phobia...  ~  June 12, 2011:  1mo ROV & she persists w/ mult somatic complaints & flight of ideas hx, "I have really suffered" she says...    GI> Hx HH, gastritis, divertics, & vague abd pains> she is not happy w/ DrStark's eval & sought second opinion (DrMann declined to see her); she saw DrJEdwards & his notes are reviewed;  Today she is quite distraught about papers to fill out for the GI dept at [facility]- DrNThorne...    Ortho> c/o pain in left leg w/ some edema (she has VI & dependent edema) & states her legs feel like lead; exam shows some incr in LE edema & we discussed incr in her Lasix from 20mg  to 40mg /d plus a 2gm sodium diet.Marland KitchenMarland Kitchen  We rechecked her labs today> all WNL.Marland Kitchen.  ~  July 09, 2011:  39mo ROV & add-on due to sm area of dermatitis on her left leg, sl excoriated & sm scab==> her son is here today & told me she drove him crazy w/ her ruminations about this being a cancer & that we would send her to the cancer center, etc (she got angry at him for telling me); we discussed dermatitis & rec RX w/ LIDEX-E cream to apply Bid & rec Derm review if not resolved...    She is also quite concerned about a "bump" on the back of her head==> occipital bone & it's symmetric, she is reassured...    She has been to [facility] GI Dept DrThorne & note is pending but she has lab sheet (all wnl) & is sch for EGD 9/12; they told her to stop Metamucil, and take Miralax, Senakot; she is most  concerned/ upset by an evaluation form she received asking questions like how long she waited for this or that during her visit & she doesn't know what to do or how to complete the info! I encouraged her to take her Alpraz regularly Tid...  ~  September 22, 2011:  2.539mo ROV & she states "terrible" c/o phlegm, swelling, feeling bad, wt loss, "this throat business- they found white balls"; very anxious w/ mult somatic complaints >>    HBP> on Norvasc2.5, Coreg3.125Bid, Losartan50, Lasix20-40/d; she went to the ER w/ ?angioedema of the tongue, seen by [facility] who called me & we decided to stop her Cozaar; BP today off Cozaar & on the other 3 meds showed 122/72> rec to continue the same...    GI> she saw DrThorne at [facility] 9/12 for a comprehensive GI eval> note reviewed- they did an EGD 08/06/11 w/ mult white nodular plaques seen in cardia & fundus- Bx'd & pending (she is very concerned);  she went to the ER 08/16/11 w/ abd discomfort- the same vague left side abd discomfort that she has seen mult physicians about- Exam showed mild diffuse tenderness;  they did CT Abd showing divertics, no inflamm, right renal & left hep lobe cysts, atherosclerotic changes, NAD;  Labs & urine were essentially WNL;  EKG showed NSR, RBBB & LAD, no acute changes...  she saw DrEdwards [facility]) 10/12 w/ mult complaints including "a knot on her skull & she feels that her brains are coming out"; he thought she was doing reasonably well from the GI standpoint w/ her dysphagia, vague abd pain, & constip...    Ortho> she saw DrWhitfield w/ MRI Lumbar spine 10/12 showing degenerative changes w/ mild progression since 7/10 MRI, mild-mod sp stenosis & lat recess stenosis at several levels; she has Vicodin to take up to 3/d as needed for pain; she is anxiously awaiting f/u visit w/ DrWhitfield...    Derm> DrLupton saw pt 07/23/11 w/ removal of sm squamous cell ca (in situ) from left lower leg  ~  December 29, 2011:  35mo ROV & Heather [name] has her  usual complement of somatic complaints> all seem stable w/o appreciable change; she does not want to see DrJEdwards again, but is happy w/ eval & communication from DrThorne at [facility] says her abd pain is coming from the arthritis in her back, & she has seen DrWhitfield for that... She lists mult OTC GI meds including> Prilosec, Miralax, Metamucil, Senakot, Mylicon...    BP controlled on her Coreg, Amlodipine, Lasix; BP= 120/68 today & denies CP/ palpit, ch in DOE,  edema, etc...    She saw DrThorne recently for f/u & note pending; prev note 11/12 reviewed> she has done a thorough investigation...    She saw DrWhitfield 12/12 for f/u LBP w/ known DJD & severe sp stenosis L4-5 & neurogenic claudication; she did PT & has a brace, he referred her to [facility] for shots...  ~  Mar 29, 2012:  104mo ROV & she continues w/ mult complaints- ran out of Dymista sample for nasal symptoms (didn't help), thinks her BP is too low, c/o swelling (has VI- reminded to elim sodium, wear support hose, elevate, take Lasix40), "constip" but stool is loose & soft (rec- Align), saw DrEdwards again for GI- she denies dysphagia, still c/o chr abd discomfort, etc; c/o "dark urine" it was clear and C&S was neg; she requires constant reassurance about her condition & that she does not have cancer; asked to take her Xanax more regularly...  We reviewed prob list, meds, xrays and labs>  See below>>   ~  June 28, 2012:  104mo ROV & Lezette continues to have mult somatic complaints & says "terrible, terrible, terrible" states she's lost all the muscles in her abd & now uses a girdle; "all my doctors say for me to see you" eg> DrEdwards, DrNishan, etc; she still believes that she has cancer "somewhere- just like my friend"... Then she launches into a flight of ideas of complaints> carpel tunnel symptoms (she wants to f/u w/ DrWhtfield), cough/ phlegm, dizzy, "it's my stomach", etc...    We reviewed prob list, meds, xrays and labs> see below for  updates >>  ~  August 03, 2012:  53mo ROV & add-on at pt's insistance for "hocking- it's not a cough, it's a hock"; then she proceeds to clear her throat forcefully & "hock-up" some clear phlegm; she notes "thick white foamy slimy sputum w/ flecks of chopped meat in it"; she denies cough/ hemoptysis/ fcs, etc; she notes some mild dyspnea (at baseline) and occas CP from the force of the hocking; she is quite insistent on additional work-up & Rx for this perceived problem:     Exam is clear- no oral lesions, neck is neg, chest is clear...    CXR shows sl overinflation, clear lungs, normal heart size w/ elongation & tortuous Ao...    PFTs are wnl> FVC=2.62 (128%), FEV1=2.20 (158%), %1sec=84; mid-flows=274% of predicted...    Note> she is quite anxious & has a cancer phobia; again asked to take her Alprazolam Tid regularly... We reviewed prob list, meds, xrays and labs> see below for update >> We decided on a trial regimen to help her "hocking" w/ short term f/u to assess efficacy> MUCINEX 600mg - 2Bid w/ lots of fluids; HYCODAN syrup- 1tspQid; DULERA100/5- 1puffBid then rinse w/ Listerine... Note> before completing her eval today she insisted in a right breast exam stating that DrBertrand insisted that her PCP examine her breast at every visit> it was neg.  ~  August 30, 2012:  53mo ROV & recheck> "I'm just falling apart, meds not helping, Dulera choked me up, mouth glued together" therefore she stopped the [facility] & does better on no Rx she says "there is something wrong w/ me, I'm losing so much weight, I'm skin & bones" wt today= 156# which is up 8# from last visit; she is c/o snorting phlegm that she says tastes bitter & has little white balls in it (she could not produce any sput despite a lot of hocking in the office today (advised to  continue Mucinex600mg -2Bid w/ fluids)......  Also c/o left lat CWP, tender ribs & wears lumbar brace/ rib binder, +takes Hydrocodone/ Tramadol as needed...     We  reviewed prob list, meds, xrays and labs> see below for updates >> she declines the 2013 flu vaccine... LABS 10/13:  Chems-wnl;  CBC- wnl;  TSH= 1.79  ~ September 27, 2012: 64mo ROV & Shailee is rambling on w/ 'flight of ideas" today & due to this we tried a systems approach & touched on all these problems during her visit today>>     Resp symptoms> c/o SOB, hocking phlegm, "this throat business" for which she has seen DrRosen, ENT; now c/o decr hearing, thick phlegm, etc; We reviewed CXR 10/13- clear & wnl; PFT 10/13- normal airflow; Rec- Mucinex600mg -2Bid, lots of fluids, & f/u w/ ENT for hearing eval...     HBP> on Coreg3.125Bid, Amlod2.5, Lisin40; BP= 120/64 & we reviewed labs from 10/13- all wnl...     HxCP> on ASA81; followed by DrBensimhon/ Walker Kehr for Cards & their notes are reviewed=> neg ischemic work up...     VI, Edema> she went to the ER 11/13 w/ edema; VDopplers were neg for DVT; they reiterated the recs for no salt, elevation, support hose, & continue Lasix40.     GI- HH, Gastirits, Divertics, chronic pain & "swelling"> on Omep20- taking 2/d & we discussed derc to one daily; plus Miralax & Senakot-S...     Renal cyst> she is very anxious about everything & has a cancer phobia; DrKimbrough followed this benign simple cyst for yrs- no change...     Hx Breast cancer> she had left mod radical mastectomy 1994 by DrBlievernicht- no known recurrence & she is given reassurance...     Ortho- DJD, Cspine spongy, LBP, CTS> followed by DrWhitfield; on Vicodin & Tramadol but prefers the latter she says...     Anxiety> on Alpraz0.5mg Tid but "I'm not taking it very often" she says & encouraged to use more regularly...  We reviewed prob list, meds, xrays and labs> see below for updates >>   ~  October 27, 2012:  64mo ROV & Jakaylee has mult somatic complaints as usual- dry mouth at night, hocking thick phlegm, c/o stomach/ back/ etc... Again we reviewed the following problems during today's visit>>     Resp symptoms> on Mucinex2Bid +Fluids; she saw DrRosen, ENT 11/13- dysphagia, presbycusis, referral otalgia; exam was normal, barium swallow 11/13 showed diminished primary peristaltic wave, mild tertiary contractions, prominent cricopharyngeus muscle, no hiatal hernia/ no reflux/ barium pill passed into the stomach without delay...    HBP> on Coreg3.125Bid, Amlod2.5, Lisin40; BP= 120/60 & she persists w/ mult somatic complaints...    HxCP> on ASA81; followed by DrBensimhon/ DrNishan for Cards & their notes are reviewed=> neg ischemic work up previously...     VI, Edema> she went to the ER 11/13 w/ edema; VDopplers were neg for DVT; they reiterated the recs for no salt, elevation, support hose, & continue Lasix40.     GI- HH, Gastirits, Divertics, chronic pain & "swelling"> on Omep20mg /d plus Miralax & Senakot-S; she states "my stomach has gone to my back".    Renal cyst> she is very anxious about everything & has a cancer phobia; DrKimbrough followed this benign simple cyst for yrs- no change...     Hx Breast cancer> she had left mod radical mastectomy 1994 by DrBlievernicht- no known recurrence & she is given reassurance...     Ortho- DJD, Cspine spondy, LBP, CTS> followed by DrWhitfield &  he injected her right knee 1wk ago; on Vicodin & Tramadol but prefers the latter she says...     Anxiety> on Alpraz0.5mg Tid but "I'm not taking it very often" she says & encouraged to use more regularly...  We reviewed prob list, meds, xrays and labs> see below for updates >>   ~  December 13, 2012:  6wk ROV & Heather [name] had a good holiday but launches into her usual litany of complaints> feels bad in the mornings, legs sore "they get hot", "I get hot in my back", c/o "hocking up little white balls of phlegm", stools are dark & small, etc... She thinks she needs massage for her back problems & has seen DrWhitfield w/ PT recommended... She is rec to increase Mucinex 600mg -2Bid w/ Fluids;  Not currently c/o dyspnea;  BP  well controlled on meds;  She has chronic GI complaints managed by DrStark & she had no specific GI complaints today;  She has Vicodin & Tramadol for pain;  She has Therapist, sports for anxiety & rec (again) to take it more regularly...     We reviewed prob list, meds, xrays and labs> see below for updates >>   ~  January 07, 2013:  3-4wk ROV & add-on for multiple somatic complaints> MsHarris insisted on this add-on visit due to mult somatic complaints & when asked what specifically she was complaining about she launched into a flight of ideas all over the place> stiff; worse & worse; phlegm w/ white flecks & she cannot recalll our mult prev discussions about this w/ rec for Mucinex-2Bid & lots of fluids; legs jerking (rec for tonic water at bedtime); "my back is on fire" esp at night (rec to try Gabapentin 100mg  1-2 at bedtime); wt loss w/ fair appetite (rec to drink Ensure 2 cans/d betw meals); wants to try massage therapy (I told her ok w/ me)...          Problem List:  RESPIRATORY SYMPTOMS >> c/o "hocking phlegm" DYSPNEA (ICD-786.9) - eval 5/11 c/o SOB- can't get a DB, can't get enough air in, etc... occurs at rest & w/ exerc "I'm very active despite my arthritis", talks rapidly in long sentences w/o apparent distress... no cough, phlegm, hemoptysis, etc- but as usual she is quite distressed by these symptoms and wants further eval> we discussed re-assessment w/ CXR (clear, NAD);  PFT (totally norm airflow);  Labs (all WNL);  Rx w/ ALPRAZOLAM 0.5mg  Tid & encouraged to take regularly. ~  CXR 4/13 showed norm heart size, clear lungs, mild TSpine spondylosis... ~  She continues to complain of drainage, throat symptoms, etc; Rx w/ Mucinex, OTC antihist, Nasonex, etc;  Seen by ENT, DrRosen w/ ?xerostomia & rec incr water etc. ~  9/13: Add-on visit for "hocking, it's not a cough, it's a hock"> see above... ~  9/13: CXR shows sl overinflation, clear lungs, normal heart size w/ elongation & tortuous Ao;  PFTs are  wnl> FVC=2.62 (128%), FEV1=2.20 (158%), %1sec=84; mid-flows=274% of predicted... We discussed trial rx for her symptoms- Mucinex, Fluids, Hycodan, Dulera100- 1puffbid. ~  10/13: she reports INTOL to [facility] w/ throat & mouth symptoms "like glue" therefore stopped this med; advised to continue the others.  HYPERTENSION (ICD-401.9) - on AMLODIPINE 2.5mg /d,  COREG 3.125mg Bid,  LASIX 40mg - taking 1/2tab/d because 1 tab makes her too weak...  ~  7/12:  C/o incr edema in legs & advised to elim sodium from diet & incr her LASIX to 40mg /d... ~  10/12:  Prev Cozaar 50mg  was  stopped 10/12 via the ER when she presented w/ ?angioedema of the tongue... ~  2/13:  BP= 120/68 & doing well on 3 med regimen & off Losartan. ~  5/13:  BP= 110/60 & she has mult somatic complaints but denies HA, CP, palpit, ch in SOB or edema... ~  8/13:  BP= 112/58 & there is no change in her mult somatic complaints... ~  9/13:  BP= 100/60 on Coreg3.125Bid & Amlod2.5 & prn Lasix... ~  10/13:  BP= 120/58 & she has mult somatic complaints as before... ~ 11/13: on Coreg3.125Bid, Amlod2.5, Lisin40; BP= 120/64 & we reviewed labs from 10/13- all wnl...  ~  12/13: on Coreg3.125Bid, Amlod2.5, Lisin40; BP= 120/60 & she persists w/ mult somatic complaints... ~  2/14: on Coreg3.125Bid, Amlod2.5, Lisin40; BP= 116/58 & she persists w/ mult somatic complaints.  Hx of CHEST PAIN (ICD-786.50) - she takes ASA 81mg /d + Tylenol Prn... followed by DrBensimhon for Cards. ~  Baseline EKG w/ RBBB & LAD, no acute changes... ~  NuclearStressTest done 2007 & 2009 showed normal- without scar or ischemia, EF=67%...   ~  2DEcho 7/08 showed mild AI, mild diastolic dysfunction, EF=55-60%. ~  CT Abd 1/09 & 9/12 showed incidental coronary calcif, & aorto-iliac atherosclerotic changes as well... ~  8/13: she saw DrNishan> HBP, DD, atypCP w/ neg Myoviews & no changes made... ~  10/13:  She uses a back brace/ abd binder to help her CWP...  VENOUS INSUFFICIENCY  (ICD-459.81) - she has mod VI and follows a low sodium diet, elevates legs when able, and wears support hose. ~  7/12:  C/o incr edema & we reviewed a 2gm sodium diet & increased her Lasix from 20mg /d to 40mg /d... ~  5/13:  She has persist complaints but mild chr VI w/ min edema & reminded to elim sodium, elev, support hose, take the Lasix... ~ 11/13: she went to the ER w/ edema; VDopplers were neg for DVT; they reiterated the recs for no salt, elevation, support hose, & continue Lasix40.  HIATAL HERNIA (ICD-553.3) & GASTRITIS (ICD-535.50) - on OMEPRAZOLE 20mg /d & followed by DrJEdwards for GI having fired DrStark & [facility] in past. ~  last EGD was 2/08 w/ 3cm HH & gastitis... ~  CT Abd 1/09 revealed calcif gallstones, & mild ectasia of AA... ~  CT Abd 1/11 showed left colon divertics, tiny hepatic lesions w/o change & right renal cyst stable. ~  CT Abd 9/12 showed divertics (no inflamm), right renal & left hep lobe cysts, atherosclerotic changes, NAD... ~  Seen by DrEdwards 4/13> c/o dysphagia & congestion; he notes mult GI complaints, known esoph dysmotility w/o stricture, hard to pin down her complaints...  DIVERTICULOSIS, COLON (ICD-562.10) - followed regularly by DrStark. ~  colonoscopy 7/03 by DrStark showing divertics only... we discussed Miralax + Senakot-S for constipation. ~  colonoscopy 2/11 showed divertics, 3mm polyp, hems...  ? of ABDOMINAL PAIN & SYMPTOMATOLOGY - she had a very thorough work up in 2009> she has a cancer phobia and is very difficult to reassure her that everything appropriate has been done & no cancer found... Mult additional GI evals since then from [facility], [facility] (DrEdwards), [facility] (DrThorne)... ~  eval by GI- DrStark 12/08 but pt wasn't satisfied w/ his examination... ~  full labs 1/09 were WNL, sed=20... ~  CT Abd/ Pelvis 1/09= no acute abn in abd (calcif gallstones & mild ectasia of AA), calcif fibroid in the pelvis, & ?regarding the left ovary. ~  referral  to DrFontaine, GYN> his notes are reviewed... ~  follow up w/ DrKimbrough, Urology> his notes are reviewed... ~  eval DrWhitfield for Orthopedics- rec shots in back by [facility] w/ some improvement. ~  repeat GI eval 1/11 by DrStark w/ CT & colonoscpy as above- no acute problems. ~  5/11 & 7/11 >> persist complaints w/ neg recheck by DrStark & DrKimbrough... ~  12/11:  routine GYN surveillance DrFontaine found anteverted uterus w/ fibroids, atrophic ovaries bilat, no mass. ~  Repeat GI eval 4/12 by DrStark> he tried to reassure the pt, felt some symptoms might be from poor abd wall muscle tone, rec continue Omep, laxatives... ~  6/12:  She sought another GI opinion DrEdwards> he did not feel she needed any additional testing... ~  She tells me she has yet another appt to see a gastroenterologist at [facility]==> seen by DrThorne 8/12 & note reviewed... ~  EGD by DrThorne 9/12 w/ mult whitish nodular plaques in cardia & fundus> Bx results pending & she is very concerned (remember her hx cancer phobia). ~  She continues to f/u w/ DrThorne at [facility] & is happy w/ her eval...  RENAL CYST (ICD-593.2) - she has a 1 cm right renal cyst seen on CT in 2007 and followed by DrKimbrough... f/u CT Abd 1/09 - no change in the cyst... f/u renal ultrasound 1/10 w/ small bilat cysts... f/u CT Abd 1/11- no change in cyst. ~  she saw DrKimbrough 7/11 for LBP, microhematuria, urge incontinence- stable, no additional Rx. ~  she saw DrKimbrough 1/12 for urge incontinence, freq, nocturia> rec Desmopressin 0.2mg - 1/2 tab Qhs.  CARCINOMA, BREAST, LEFT (ICD-174.9) - Surgery 9/94 by DrBlievernicht w/ Left modified radical mastectomy... +estrogen receptors and Rx w/ Tamoxifen... f/u mammograms all neg... breast exam on right has been neg- no nodules palpated... She says that DrBertrand insisted that her PCP check her right breast at each & every office visit to be sure she doesn't develop any nodules...  DEGENERATIVE JOINT DISEASE  (ICD-715.90) - on OTC meds + VICODIN Prn... she has end-stage OA of Right Knee per DrWhitfield treated w/ shots in the past... she has a knee brace and a cane for ambulation... ~  8/13:  She wants a better pain pill & we discussed change to [facility] 5/325 Tid prn... ~  12/13: she tells me that DrWhitfield injected her knee last wk- improved...  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 Rx w/ rest, heat, Percocet, and f/u w/ Ortho...  Hx of LOW BACK PAIN, CHRONIC (ICD-724.2) - eval DrWhitfield w/ rec for shots by DrNewton & she's feeling sl better & wears back brace... ~  5/10: mult somatic complaints- primarily c/o left side/ postero-lat rib pain...  Exam w/ tender left lat/ post ribs... Bone Scan= neg... ~  MRI Lumbar spine 7/10 by DrWhitfield... ~  Epidural steroid shot by DrNewton 8/10 & 1/11 ~  Repeat LBP eval by DrWhitfield w/ another MRI lumbar spine showing degenerative changes w/ mild progression since 7/10 MRI, mild-mod sp stenosis & lat recess stenosis at several levels ~  DrWhitfield referred her to [facility] to consider more shots but she is not so inclined... ~  6/13: she saw DrTooke w/ lumbar spondylosis; he felt that he had nothing to offer her & rec incr Tramadol to Qid...  CARPAL TUNNEL SYNDROME, BILATERAL (ICD-354.0) - she had prev right carpal tunnel release by DrWhitfield and was c/o increasing discomfort in her  left wrist despite wrist splint Qhs- had left carpal tunnel release 1/10 by DrWhitfield...  ANXIETY (ICD-300.00) - on ALPRAZOLAM 0.5mg  Tid & encouraged to take it regularly... she has a cancer phobia & it is very difficult to reassure her regarding her symptoms and our evaluations... ~  7/13: she went to the ER c/o swelling of the tongue but eval by EDP indicated that her tongue was normal & not swollen, she was not on an ACE, given Pred Rx anyway.   Past Surgical History  Procedure  Laterality Date  . Left mastectomy    . Carpal tunnel release      Outpatient Encounter Prescriptions as of 01/07/2013  Medication Sig Dispense Refill  . ALPRAZolam (XANAX) 0.5 MG tablet Take 1 tablet (0.5 mg total) by mouth 3 (three) times daily as needed. For anxiety  90 tablet  5  . amLODipine (NORVASC) 2.5 MG tablet Take 2.5 mg by mouth daily.       Marland Kitchen aspirin EC 81 MG tablet Take 81 mg by mouth daily.      . carvedilol (COREG) 3.125 MG tablet Take 3.125 mg by mouth 2 (two) times daily with a meal. For blood pressure      . Cholecalciferol (VITAMIN D3) 1000 UNITS CAPS Take 1 capsule by mouth daily. For your bones      . furosemide (LASIX) 40 MG tablet Take 1 tablet (40 mg total) by mouth daily. For swelling  30 tablet  6  . guaiFENesin (MUCINEX) 600 MG 12 hr tablet Take 1,200 mg by mouth 2 (two) times daily. For congestion      . HYDROcodone-acetaminophen (NORCO) 5-325 MG per tablet 1 tablet by mouth three times daily as needed for pain  90 tablet  5  . Multiple Vitamins-Minerals (MULTIVITAMINS THER. W/MINERALS) TABS Take 1 tablet by mouth daily.      Marland Kitchen omeprazole (PRILOSEC) 20 MG capsule Take 1 by mouth 30 min before the first meal of the day      . polyethylene glycol (MIRALAX / GLYCOLAX) packet Take 17 g by mouth daily as needed. For constipation      . PRESCRIPTION MEDICATION Place 1 drop into both eyes daily. Eye drops (samples were given to patient by Dr. Dione Booze)      . sennosides-docusate sodium (SENOKOT-S) 8.6-50 MG tablet Take 1 tablet by mouth daily as needed. For constipation      . traMADol (ULTRAM) 50 MG tablet Take 1 tablet (50 mg total) by mouth every 6 (six) hours as needed for pain. As needed for pain  30 tablet  5  . [DISCONTINUED] HYDROcodone-acetaminophen (NORCO) 5-325 MG per tablet Take 1/2 to 1 tablet by mouth three times daily as needed for pain  90 tablet  5  . gabapentin (NEURONTIN) 100 MG capsule 1-2 tablets by mouth at bedtime  60 capsule  0   No  facility-administered encounter medications on file as of 01/07/2013.    Allergies  Allergen Reactions  . Fentanyl     REACTION: nausea    Current Medications, Allergies, Past Medical History, Past Surgical History, Family History, and Social History were reviewed in [employer] record.    Review of Systems      See HPI - all other systems neg except as noted... She has mult somatic complaints and anxiety. The patient complains of decreased hearing, chest discomfort, dyspnea on exertion, abdominal pain, incontinence, muscle weakness, and difficulty walking.  The patient denies anorexia, fever, vision loss, hoarseness, syncope,  peripheral edema, prolonged cough, headaches, hemoptysis, melena, hematochezia, severe indigestion/heartburn, hematuria, transient blindness, depression, abnormal bleeding, enlarged lymph nodes, and angioedema.     Objective:   Physical Exam   WD, WN, 79 y/o BF in NAD... she is very anxious... GENERAL:  Alert & oriented; pleasant & cooperative... HEENT:  [place]/AT, EOM-wnl, PERRLA, EACs-clear, TMs-wnl, NOSE-clear, THROAT-clear & wnl, no lesions seen. NECK:  Neck ROM decreased, no JVD; normal carotid impulses w/o bruits; no thyromegaly or nodules palpated; no lymphadenopathy. CHEST:  Clear to P & A; without wheezes/ rales/ or rhonchi heard... + tender right lat 11th & 12th ribs on palp. BREAST:  s/p left mastectomy... right breast normal- without nodules palpated... HEART:  Regular Rhythm; without murmurs/ rubs/ or gallops detected... ABDOMEN:  Soft & nontender; normal bowel sounds; no organomegaly or masses palpated... EXT: without deformities, mod arthritic changes; no varicose veins/ +venous insuffic/ tr edema. NEURO:  CN's intact; no focal neuro deficits... DERM:  Small sl excoriated area of dermatitis left shin- no drainage,no rash, etc...  RADIOLOGY DATA:  Reviewed in the EPIC EMR & discussed w/ the patient...  LABORATORY DATA:   Reviewed in the EPIC EMR & discussed w/ the patient...   Assessment & Plan:    RESPIRATORY SYMPTOMS w/ Dyspnea & throat clearing>  She had a neg pulm eval 2011 as above; she insisted on additional eval 9/13 for her chr throat clearing symptom; CXR w/o acute changes & PFTs normal for her 79 y/o lungs; she is quite insistent on med rx for her problem & we reviewed Mucinex, Hycodan, Dulera, & Listerine (she was intol to the [facility]); pt very anxious & Alprazolam seems to help when she takes it...  HBP>  Controlled on low doses of several meds including Coreg, Norvasc & Lasix;  She is encouraged to take meds regularly...  Hx CP> evaluated for Cards by DrBensimon & he has not been in favor of invasive testing; stable on conservative med rx...  VI/ Edema>  She is concerned about incr edema & we reviewed 2gm sodium diet & rec keep Lasix at 40mg /d...  GI> HH, Gastritis, Divertics, vague abd pain>  I tried once again to reassure her about the thorough GI evaluations that her has undergone; she is happy w/ DrThorne's eval & will f/u w/ her & in the meanwhile continue rx w/ Prilosec40, Miralax, Metamucil, Senakot, Mylicon, etc...  Renal cyst>  This is benign & has been followed by DrKimbrough...  Hx left Breast Cancer>  No known recurrence...  DJD> Cervical spondylosis & chronic LBP>  She has been evaluated by DrWhitfield for Ortho & given epid steroid shots by DrNewton... She wants a better pain pill & we tried [facility] 5/325 tid as needed but she prefers the Tramadol; knee shots from DrWhitfield helped...  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  Anxiety>  Asked once again to increase the Alprazolam to 1/2 - 1 tab TID regularly...   Patient's Medications  New Prescriptions   GABAPENTIN (NEURONTIN) 100 MG CAPSULE    1-2 tablets by mouth at bedtime  Previous Medications   ALPRAZOLAM (XANAX) 0.5 MG TABLET    Take 1 tablet (0.5 mg total) by mouth 3  (three) times daily as needed. For anxiety   AMLODIPINE (NORVASC) 2.5 MG TABLET    Take 2.5 mg by mouth daily.    ASPIRIN EC 81 MG TABLET    Take 81 mg by mouth daily.  CARVEDILOL (COREG) 3.125 MG TABLET    Take 3.125 mg by mouth 2 (two) times daily with a meal. For blood pressure   CHOLECALCIFEROL (VITAMIN D3) 1000 UNITS CAPS    Take 1 capsule by mouth daily. For your bones   FUROSEMIDE (LASIX) 40 MG TABLET    Take 1 tablet (40 mg total) by mouth daily. For swelling   GUAIFENESIN (MUCINEX) 600 MG 12 HR TABLET    Take 1,200 mg by mouth 2 (two) times daily. For congestion   MULTIPLE VITAMINS-MINERALS (MULTIVITAMINS THER. W/MINERALS) TABS    Take 1 tablet by mouth daily.   OMEPRAZOLE (PRILOSEC) 20 MG CAPSULE    Take 1 by mouth 30 min before the first meal of the day   POLYETHYLENE GLYCOL (MIRALAX / GLYCOLAX) PACKET    Take 17 g by mouth daily as needed. For constipation   PRESCRIPTION MEDICATION    Place 1 drop into both eyes daily. Eye drops (samples were given to patient by Dr. Dione Booze)   SENNOSIDES-DOCUSATE SODIUM (SENOKOT-S) 8.6-50 MG TABLET    Take 1 tablet by mouth daily as needed. For constipation   TRAMADOL (ULTRAM) 50 MG TABLET    Take 1 tablet (50 mg total) by mouth every 6 (six) hours as needed for pain. As needed for pain  Modified Medications   Modified Medication Previous Medication   HYDROCODONE-ACETAMINOPHEN (NORCO) 5-325 MG PER TABLET HYDROcodone-acetaminophen (NORCO) 5-325 MG per tablet      1 tablet by mouth three times daily as needed for pain    Take 1/2 to 1 tablet by mouth three times daily as needed for pain  Discontinued Medications   No medications on file

## 2013-01-13 ENCOUNTER — Telehealth: Payer: Self-pay | Admitting: Pulmonary Disease

## 2013-01-13 DIAGNOSIS — R109 Unspecified abdominal pain: Secondary | ICD-10-CM

## 2013-01-13 DIAGNOSIS — R634 Abnormal weight loss: Secondary | ICD-10-CM

## 2013-01-13 NOTE — Telephone Encounter (Signed)
Per SN----ok to order---  Ct chest with contrast Ct abd/pelvis with contrast  Cmp, cbcd. Sed rate, cea level, ca19 level.  All orders have been placed in the computer to be scheduled.  Please let the pt know.  thanks

## 2013-01-13 NOTE — Telephone Encounter (Signed)
I called and spoke with Karie Chimera, PA who saw the pt yesterday He states that given the significant wt loss and pt c/o left side abd pain that the pt complains of, he feels that she may have a recurrent CA He states that she was continuing to c/o thick mucus also He states that there is not much they can do, but thought it might be worth it to go ahead and have SN order a ct abd and chest  He states when ov note is done will fax this to SN Please advise thanks!

## 2013-01-13 NOTE — Telephone Encounter (Signed)
LMTCB x 1 for the pt  

## 2013-01-14 NOTE — Telephone Encounter (Signed)
Pt advised. Jenell Dobransky, CMA  

## 2013-01-17 ENCOUNTER — Telehealth: Payer: Self-pay | Admitting: Pulmonary Disease

## 2013-01-17 ENCOUNTER — Other Ambulatory Visit (INDEPENDENT_AMBULATORY_CARE_PROVIDER_SITE_OTHER): Payer: Medicare Other

## 2013-01-17 DIAGNOSIS — R109 Unspecified abdominal pain: Secondary | ICD-10-CM

## 2013-01-17 DIAGNOSIS — R634 Abnormal weight loss: Secondary | ICD-10-CM

## 2013-01-17 LAB — COMPREHENSIVE METABOLIC PANEL
BUN: 17 mg/dL (ref 6–23)
CO2: 30 mEq/L (ref 19–32)
Calcium: 9.2 mg/dL (ref 8.4–10.5)
Chloride: 106 mEq/L (ref 96–112)
Creatinine, Ser: 0.9 mg/dL (ref 0.4–1.2)
GFR: 77.19 mL/min (ref >60.00)
Total Bilirubin: 1.1 mg/dL (ref 0.3–1.2)

## 2013-01-17 LAB — CBC WITH DIFFERENTIAL/PLATELET
Basophils Absolute: 0 10*3/uL (ref 0.0–0.1)
Basophils Relative: 0.7 % (ref 0.0–3.0)
Eosinophils Absolute: 0.1 10*3/uL (ref 0.0–0.7)
Eosinophils Relative: 1.6 % (ref 0.0–5.0)
HCT: 43 % (ref 36.0–46.0)
Hemoglobin: 13.9 g/dL (ref 12.0–15.0)
Lymphocytes Relative: 37.4 % (ref 12.0–46.0)
Lymphs Abs: 2.3 10*3/uL (ref 0.7–4.0)
MCHC: 32.4 g/dL (ref 30.0–36.0)
MCV: 93.8 fl (ref 78.0–100.0)
Monocytes Absolute: 0.5 10*3/uL (ref 0.1–1.0)
Monocytes Relative: 7.7 % (ref 3.0–12.0)
Neutro Abs: 3.2 10*3/uL (ref 1.4–7.7)
Neutrophils Relative %: 52.6 % (ref 43.0–77.0)
Platelets: 211 10*3/uL (ref 150.0–400.0)
RBC: 4.58 Mil/uL (ref 3.87–5.11)
RDW: 14.3 % (ref 11.5–14.6)
WBC: 6.2 10*3/uL (ref 4.5–10.5)

## 2013-01-17 LAB — SEDIMENTATION RATE: Sed Rate: 14 mm/hr (ref 0–22)

## 2013-01-17 LAB — CANCER ANTIGEN 19-9: CA 19-9: <1.2 U/mL (ref <35.0)

## 2013-01-17 NOTE — Telephone Encounter (Signed)
Called and spoke with pt and she is aware that she will come by our office to pick up the contrast for her ct scan and she will have her blood work done as well.  Pt is aware and nothing further is needed.

## 2013-01-18 ENCOUNTER — Other Ambulatory Visit: Payer: Medicare Other

## 2013-01-19 ENCOUNTER — Telehealth: Payer: Self-pay | Admitting: Pulmonary Disease

## 2013-01-19 NOTE — Telephone Encounter (Signed)
Called and spoke with pt and she is aware that ok to take her meds in the morning.  She will go for her ct scan in the morning.  Nothing further is needed.

## 2013-01-20 ENCOUNTER — Other Ambulatory Visit: Payer: Medicare Other

## 2013-01-20 ENCOUNTER — Ambulatory Visit (INDEPENDENT_AMBULATORY_CARE_PROVIDER_SITE_OTHER)
Admission: RE | Admit: 2013-01-20 | Discharge: 2013-01-20 | Disposition: A | Discharge status: Home / Self Care | Payer: Medicare Other | Source: Ambulatory Visit | Attending: Pulmonary Disease | Admitting: Pulmonary Disease

## 2013-01-20 DIAGNOSIS — R109 Unspecified abdominal pain: Secondary | ICD-10-CM

## 2013-01-20 MED ORDER — IOHEXOL 300 MG/ML  SOLN
100.0000 mL | Freq: Once | INTRAMUSCULAR | Status: AC | PRN
  Administered 2013-01-20: 100 mL via INTRAVENOUS

## 2013-01-24 ENCOUNTER — Telehealth: Payer: Self-pay | Admitting: Pulmonary Disease

## 2013-01-24 NOTE — Telephone Encounter (Signed)
Called and spoke with pt and she is aware of ct results and that SN recs for her to take the mucinex 1200 mg bid with plenty of fluids.  We could try xopenex in the nebulizer tid  #90.     i called and spoke with pt and she stated that she does not want to start on any other new med if it is not going to help.  i advised the pt that she can try the nebulizer to see if it will help but the pt declined.  Pt stated that Dr. Lorri Frederick in [facility] wanted her to start taking the prilosec 40 mg daily.  Pt has appt with SN on 3/27 and will keep this appt with SN.

## 2013-01-24 NOTE — Telephone Encounter (Signed)
Called, spoke with pt: 1. She would like to know the results of CT Abd and CT Chest.  Results are in epic and have been printed to place on SN's cart with phone msg. 2.  C/o having mucus in her throat.  Reports spitting up thick, white, foamy mucus with "brown lumps at times."  Would like recs on this. 3.  States she feels like she is "burning up" at times.  Pt denies this being a hot flash or having any sweating with these episodes.  She would like SN's recs on this.   Dr. Kriste Basque, pls advise.  Thank you.  Last OV with SN:  01/07/13; asked to f/u in 4 wks Pending OV with SN: 02/10/13  [facility]  Allergies verified with pt: Allergies  Allergen Reactions  . Fentanyl     REACTION: nausea

## 2013-01-24 NOTE — Telephone Encounter (Signed)
Error.  Heather [name] ° °

## 2013-01-25 ENCOUNTER — Telehealth: Payer: Self-pay | Admitting: Pulmonary Disease

## 2013-01-25 DIAGNOSIS — J310 Chronic rhinitis: Secondary | ICD-10-CM

## 2013-01-25 NOTE — Telephone Encounter (Signed)
Spoke to patient she is still having this worsening thick mucus in her throat. Patient states it looks like "chopped meat" whenever she able to finally get it up.  Patient says the mucus is the same all day and night and never lets up. Patient had CT done 01/13/13 According to patient she needs to have a MRI done although CT showed nothing "major" because her insurance will not cover the CT if she does not have a follow up MRI. And patient also feels she needs to have MRI or "something" done to help relieve her of all this thick mucus. Dr. Kriste Basque please advise, thank you  CT Chest/Abd/Pelvis  results:  Notes Recorded by Michele Mcalpine, MD on 01/24/2013 at 6:50 PM Please notify patient>  CT Chest is neg- no acute changes; s/p mastectomy & DJD spine as incidental findings... CT Abd & Pelvis- several benign cysts and hardening of the arteries, stable/ NAD/ no signs of cancer...  Last OV : 01/07/13

## 2013-01-26 NOTE — Telephone Encounter (Signed)
Per SN----   SN will not order the MRI----its not true that her insurance will not cover the CT unless she has the MRI done.  She will need to take the mucinex 2 po bid with plenty of fludis, SN suggests a 2nd opinion with ENT---[facility].   She may need to see another specialist.  thanks

## 2013-01-26 NOTE — Telephone Encounter (Signed)
I spoke with pt and pt and is aware of SN recs. She voiced her understanding. Pt aware will place order for Dr. Haroldine Laws. Nothing further was needed

## 2013-02-10 ENCOUNTER — Ambulatory Visit: Payer: Medicare Other | Admitting: Pulmonary Disease

## 2013-02-12 ENCOUNTER — Emergency Department (HOSPITAL_COMMUNITY): Payer: Medicare Other

## 2013-02-12 ENCOUNTER — Encounter (HOSPITAL_COMMUNITY): Payer: Self-pay | Admitting: *Deleted

## 2013-02-12 ENCOUNTER — Emergency Department (HOSPITAL_COMMUNITY)
Admission: EM | Admit: 2013-02-12 | Discharge: 2013-02-13 | Disposition: A | Discharge status: Home / Self Care | Payer: Medicare Other | Attending: Emergency Medicine | Admitting: Emergency Medicine

## 2013-02-12 DIAGNOSIS — Z7982 Long term (current) use of aspirin: Secondary | ICD-10-CM | POA: Insufficient documentation

## 2013-02-12 DIAGNOSIS — F411 Generalized anxiety disorder: Secondary | ICD-10-CM | POA: Insufficient documentation

## 2013-02-12 DIAGNOSIS — Z8679 Personal history of other diseases of the circulatory system: Secondary | ICD-10-CM | POA: Insufficient documentation

## 2013-02-12 DIAGNOSIS — Z8719 Personal history of other diseases of the digestive system: Secondary | ICD-10-CM | POA: Insufficient documentation

## 2013-02-12 DIAGNOSIS — Z79899 Other long term (current) drug therapy: Secondary | ICD-10-CM | POA: Insufficient documentation

## 2013-02-12 DIAGNOSIS — Z8739 Personal history of other diseases of the musculoskeletal system and connective tissue: Secondary | ICD-10-CM | POA: Insufficient documentation

## 2013-02-12 DIAGNOSIS — Z853 Personal history of malignant neoplasm of breast: Secondary | ICD-10-CM | POA: Insufficient documentation

## 2013-02-12 DIAGNOSIS — M7989 Other specified soft tissue disorders: Secondary | ICD-10-CM | POA: Insufficient documentation

## 2013-02-12 DIAGNOSIS — M546 Pain in thoracic spine: Secondary | ICD-10-CM | POA: Insufficient documentation

## 2013-02-12 DIAGNOSIS — Z87448 Personal history of other diseases of urinary system: Secondary | ICD-10-CM | POA: Insufficient documentation

## 2013-02-12 DIAGNOSIS — I10 Essential (primary) hypertension: Secondary | ICD-10-CM | POA: Insufficient documentation

## 2013-02-12 LAB — URINALYSIS, ROUTINE W REFLEX MICROSCOPIC
Glucose, UA: NEGATIVE mg/dL
Specific Gravity, Urine: 1.009 (ref 1.005–1.030)
pH: 7 (ref 5.0–8.0)

## 2013-02-12 LAB — COMPREHENSIVE METABOLIC PANEL
AST: 26 U/L (ref 0–37)
Albumin: 3.5 g/dL (ref 3.5–5.2)
Calcium: 9.1 mg/dL (ref 8.4–10.5)
Chloride: 100 mEq/L (ref 96–112)
Creatinine, Ser: 0.96 mg/dL (ref 0.50–1.10)
Total Protein: 6.9 g/dL (ref 6.0–8.3)

## 2013-02-12 LAB — CBC WITH DIFFERENTIAL/PLATELET
Basophils Absolute: 0 10*3/uL (ref 0.0–0.1)
Basophils Relative: 0 % (ref 0–1)
Eosinophils Relative: 2 % (ref 0–5)
HCT: 39.8 % (ref 36.0–46.0)
MCHC: 33.4 g/dL (ref 30.0–36.0)
Monocytes Absolute: 1 10*3/uL (ref 0.1–1.0)
Neutro Abs: 2.4 10*3/uL (ref 1.7–7.7)
RDW: 13.8 % (ref 11.5–15.5)

## 2013-02-12 LAB — LIPASE, BLOOD: Lipase: 39 U/L (ref 11–59)

## 2013-02-12 LAB — POCT I-STAT TROPONIN I

## 2013-02-12 LAB — D-DIMER, QUANTITATIVE: D-Dimer, Quant: 0.46 ug/mL-FEU (ref 0.00–0.48)

## 2013-02-12 LAB — URINE MICROSCOPIC-ADD ON

## 2013-02-12 MED ORDER — MORPHINE SULFATE 2 MG/ML IJ SOLN
2.0000 mg | INTRAMUSCULAR | Status: AC | PRN
  Administered 2013-02-12 – 2013-02-13 (×3): 2 mg via INTRAVENOUS
  Filled 2013-02-12 (×3): qty 1

## 2013-02-12 MED ORDER — SODIUM CHLORIDE 0.9 % IV SOLN
1000.0000 mL | INTRAVENOUS | Status: DC
  Administered 2013-02-12: 1000 mL via INTRAVENOUS

## 2013-02-12 MED ORDER — IOHEXOL 350 MG/ML SOLN
100.0000 mL | Freq: Once | INTRAVENOUS | Status: AC | PRN
  Administered 2013-02-12: 100 mL via INTRAVENOUS

## 2013-02-12 MED ORDER — ONDANSETRON HCL 4 MG/2ML IJ SOLN
4.0000 mg | Freq: Once | INTRAMUSCULAR | Status: AC
  Administered 2013-02-12: 4 mg via INTRAVENOUS
  Filled 2013-02-12: qty 2

## 2013-02-12 MED ORDER — HYDROCODONE-ACETAMINOPHEN 5-325 MG PO TABS: 1.0000 | ORAL_TABLET | Freq: Four times a day (QID) | ORAL | Status: DC | PRN

## 2013-02-12 NOTE — ED Provider Notes (Signed)
History    CSN: [id number] Arrival date & time 02/12/13  4098 First MD Initiated Contact with Patient 02/12/13 2000      Chief Complaint  Patient presents with  . Flank Pain     HPI The patient presents to the emergency room with complaints of left flank pain. The pain started acutely about 2 hours ago. The pain is primarily in the left flank area. It is sharp and increases when she starts to lie back. It radiates to the front of her left abdomen as well as reports the chest. She denies any shortness of breath, nausea, or vomiting. She does mention having a history of a recurrent cough for which she was prescribed Mucinex.  Patient denies history of heart disease. She does not have history of aortic disease. She also does not have history of prior kidney stones. Denies polyuria polydipsia and dysuria Past Medical History  Diagnosis Date  . RBBB (right bundle branch block with left anterior fascicular block)   . Hypertension   . Hiatal hernia   . Gastritis, chronic   . Breast cancer   . Degenerative joint disease   . Anxiety state, unspecified   . Low back pain   . Carpal tunnel syndrome   . Renal cyst   . Diverticula of colon   . Chest pain   . Cervical spondylosis   . Diastolic dysfunction   . Venous insufficiency   . Constipation     Past Surgical History  Procedure Laterality Date  . Left mastectomy    . Carpal tunnel release      Family History  Problem Relation Age of Onset  . Colon cancer Neg Hx     History  Substance Use Topics  . Smoking status: Never Smoker   . Smokeless tobacco: Never Used  . Alcohol Use: No    OB History   Grav Para Term Preterm Abortions TAB SAB Ect Mult Living                  Review of Systems  Musculoskeletal:       Chronic leg swelling  All other systems reviewed and are negative.    Allergies  Fentanyl  Home Medications   Current Outpatient Rx  Name  Route  Sig  Dispense  Refill  . ALPRAZolam (XANAX) 0.5 MG  tablet   Oral   Take 1 tablet (0.5 mg total) by mouth 3 (three) times daily as needed. For anxiety   90 tablet   5   . amLODipine (NORVASC) 2.5 MG tablet   Oral   Take 2.5 mg by mouth daily.          Marland Kitchen aspirin EC 81 MG tablet   Oral   Take 81 mg by mouth daily.         . carvedilol (COREG) 3.125 MG tablet   Oral   Take 3.125 mg by mouth 2 (two) times daily with a meal. For blood pressure         . Cholecalciferol (VITAMIN D3) 1000 UNITS CAPS   Oral   Take 1 capsule by mouth daily. For your bones         . furosemide (LASIX) 40 MG tablet   Oral   Take 1 tablet (40 mg total) by mouth daily. For swelling   30 tablet   6   . guaiFENesin (MUCINEX) 600 MG 12 hr tablet   Oral   Take 1,200 mg by mouth 2 (two) times  daily. For congestion         . HYDROcodone-acetaminophen (NORCO) 5-325 MG per tablet      1 tablet by mouth three times daily as needed for pain   90 tablet   5   . Multiple Vitamins-Minerals (MULTIVITAMINS THER. W/MINERALS) TABS   Oral   Take 1 tablet by mouth daily.         Marland Kitchen omeprazole (PRILOSEC) 20 MG capsule      Take 1 by mouth 30 min before the first meal of the day         . polyethylene glycol (MIRALAX / GLYCOLAX) packet   Oral   Take 17 g by mouth daily as needed. For constipation         . sennosides-docusate sodium (SENOKOT-S) 8.6-50 MG tablet   Oral   Take 1 tablet by mouth daily as needed. For constipation         . traMADol (ULTRAM) 50 MG tablet   Oral   Take 1 tablet (50 mg total) by mouth every 6 (six) hours as needed for pain. As needed for pain   30 tablet   5   . HYDROcodone-acetaminophen (NORCO) 5-325 MG per tablet   Oral   Take 1 tablet by mouth every 6 (six) hours as needed for pain.   16 tablet   0     BP 111/50  Pulse 68  Temp(Src) 97.5 F (36.4 C) (Oral)  Resp 20  Wt 145 lb (65.772 kg)  BMI 28.32 kg/m2  SpO2 100%  Physical Exam  Nursing note and vitals reviewed. Constitutional: No distress.   HENT:  Head: Normocephalic and atraumatic.  Right Ear: External ear normal.  Left Ear: External ear normal.  Eyes: Conjunctivae are normal. Right eye exhibits no discharge. Left eye exhibits no discharge. No scleral icterus.  Neck: Neck supple. No tracheal deviation present.  Cardiovascular: Normal rate, regular rhythm and intact distal pulses.   Pulmonary/Chest: Effort normal and breath sounds normal. No stridor. No respiratory distress. She has no wheezes. She has no rales.  Abdominal: Soft. Bowel sounds are normal. She exhibits no distension. There is no tenderness. There is no rebound and no guarding.  Mild left CVA tenderness  Musculoskeletal: She exhibits edema. She exhibits no tenderness.  Neurological: She is alert. She has normal strength. No sensory deficit. Cranial nerve deficit:  no gross defecits noted. She exhibits normal muscle tone. She displays no seizure activity. Coordination normal.  Skin: Skin is warm and dry. No rash noted. She is not diaphoretic.  Psychiatric: She has a normal mood and affect.    ED Course  Procedures (including critical care time) EKG Rate 66 SINUS RHYTHM ~ normal P axis, V-rate 50- 99 RBBB AND LAFB ~ QRSd >17mS, axis(-40,240) LEFT VENTRICULAR HYPERTROPHY ~ (SV1+RV5)>3.5/(RaVL+SV3)>2.00 Labs Reviewed  CBC WITH DIFFERENTIAL - Abnormal; Notable for the following:    Neutrophils Relative 35 (*)    Lymphocytes Relative 48 (*)    Monocytes Relative 14 (*)    All other components within normal limits  COMPREHENSIVE METABOLIC PANEL - Abnormal; Notable for the following:    GFR calc non Af Amer 50 (*)    GFR calc Af Amer 58 (*)    All other components within normal limits  URINALYSIS, ROUTINE W REFLEX MICROSCOPIC - Abnormal; Notable for the following:    Hgb urine dipstick TRACE (*)    All other components within normal limits  LIPASE, BLOOD  D-DIMER, QUANTITATIVE  URINE MICROSCOPIC-ADD ON  POCT I-STAT TROPONIN I   Dg Chest 2  View  02/12/2013  *RADIOLOGY REPORT*  Clinical Data: Left chest pain, shortness of breath.  CHEST - 2 VIEW  Comparison: 08/03/2012  Findings: Heart is upper limits normal in size.  No confluent airspace opacities, effusions or edema.  Degenerative changes in the thoracic spine and shoulders.  IMPRESSION: No acute cardiopulmonary disease,   Original Report Authenticated By: Charlett Nose, M.D.    Ct Angio Chest W/cm &/or Wo Cm  02/12/2013  *RADIOLOGY REPORT*  Clinical Data: Left-sided chest pain.  Evaluate for pulmonary embolism.  CT ANGIOGRAPHY CHEST  Technique:  Multidetector CT imaging of the chest using the standard protocol during bolus administration of intravenous contrast. Multiplanar reconstructed images including MIPs were obtained and reviewed to evaluate the vascular anatomy.  Contrast: OMNIPAQUE IOHEXOL 350 MG/ML SOLN  Comparison: Chest CT 01/20/2013.  Findings:  Mediastinum: There are no filling defects within the pulmonary arterial tree to suggest underlying pulmonary embolism. Heart size is borderline enlarged. There is no significant pericardial fluid, thickening or pericardial calcification. There is atherosclerosis of the thoracic aorta, the great vessels of the mediastinum and the coronary arteries, including calcified atherosclerotic plaque in the left anterior descending coronary artery. No pathologically enlarged mediastinal or hilar lymph nodes. Esophagus is unremarkable in appearance.  Lungs/Pleura: There is a small amount of dependent atelectasis in the lower lobes of the lungs bilaterally.  3 mm ground-glass attenuation nodule in the left apex (image 15 of series 11) is unchanged, and statistically likely benign.  No other larger more suspicious appearing pulmonary nodules or masses are otherwise noted.  No acute consolidative airspace disease.  No pleural effusions.  Upper Abdomen: Numerous colonic diverticula, otherwise, unremarkable.  Musculoskeletal: There are no aggressive  appearing lytic or blastic lesions noted in the visualized portions of the skeleton. Postoperative changes of left-sided modified radical mastectomy.  IMPRESSION: 1.  No evidence of pulmonary embolism. 2.  No acute findings in the thorax to account for the patient's symptoms. 3. Atherosclerosis, including left anterior descending coronary artery disease. 4.  3 mm ground-glass attenuation nodule in the left apex is unchanged.  Nodules of this size and appearance are statistically benign and no imaging follow-up is recommended. This recommendation follows the consensus statement: Recommendations for the Management of Subsolid Pulmonary Nodules Detected at CT:  A Statement from the Fleischner Society as published in Radiology 2013; 266:304-317.   Original Report Authenticated By: Trudie Reed, M.D.      1. Thoracic back pain       MDM  Suspect pt's symptoms are related to a possible rotator cuff syndrome. On repeat evaluation she does have to have tenderness and left periscapular region. Her CT scan does not show evidence of aortic dissection or pulmonary embolism. Her symptoms are very atypical for any cardiac etiology. Patient improved with medications for her pain in the emergency department. I will discharge her home with a sling for comfort as well as a prescription for hydrocodone. I discussed the findings with the patient and her son. They are comfortable with this plan        Celene Kras, MD 02/13/13 1651

## 2013-02-12 NOTE — ED Notes (Signed)
Patient transported to CT 

## 2013-02-12 NOTE — ED Notes (Signed)
Pt in c/o left flank pain, states pain started approx 2 hours ago in her upper back and has moved to her left rib area and is moving down her side. Pt c/o mild shortness of breath, denies chest pain, n/v or any other symptoms.

## 2013-02-23 ENCOUNTER — Other Ambulatory Visit (HOSPITAL_COMMUNITY): Payer: Self-pay | Admitting: Orthopaedic Surgery

## 2013-02-23 DIAGNOSIS — R634 Abnormal weight loss: Secondary | ICD-10-CM

## 2013-03-02 ENCOUNTER — Encounter (HOSPITAL_COMMUNITY)
Admission: RE | Admit: 2013-03-02 | Discharge: 2013-03-02 | Disposition: A | Discharge status: Home / Self Care | Payer: Medicare Other | Source: Ambulatory Visit | Attending: Orthopaedic Surgery | Admitting: Orthopaedic Surgery

## 2013-03-02 DIAGNOSIS — R634 Abnormal weight loss: Secondary | ICD-10-CM | POA: Insufficient documentation

## 2013-03-02 MED ORDER — TECHNETIUM TC 99M MEDRONATE IV KIT
25.0000 | PACK | Freq: Once | INTRAVENOUS | Status: AC | PRN
  Administered 2013-03-02: 25 via INTRAVENOUS

## 2013-03-07 ENCOUNTER — Encounter: Payer: Self-pay | Admitting: Pulmonary Disease

## 2013-03-07 LAB — HM MAMMOGRAPHY

## 2013-03-14 ENCOUNTER — Ambulatory Visit (INDEPENDENT_AMBULATORY_CARE_PROVIDER_SITE_OTHER): Payer: Medicare Other | Admitting: Pulmonary Disease

## 2013-03-14 ENCOUNTER — Encounter: Payer: Self-pay | Admitting: Pulmonary Disease

## 2013-03-14 VITALS — BP 124/60 | HR 80 | Temp 97.0°F | Ht 60.0 in | Wt 146.8 lb

## 2013-03-14 DIAGNOSIS — R0609 Other forms of dyspnea: Secondary | ICD-10-CM

## 2013-03-14 DIAGNOSIS — R109 Unspecified abdominal pain: Secondary | ICD-10-CM

## 2013-03-14 DIAGNOSIS — K5730 Diverticulosis of large intestine without perforation or abscess without bleeding: Secondary | ICD-10-CM

## 2013-03-14 DIAGNOSIS — F411 Generalized anxiety disorder: Secondary | ICD-10-CM

## 2013-03-14 DIAGNOSIS — I872 Venous insufficiency (chronic) (peripheral): Secondary | ICD-10-CM

## 2013-03-14 DIAGNOSIS — I519 Heart disease, unspecified: Secondary | ICD-10-CM

## 2013-03-14 DIAGNOSIS — M47812 Spondylosis without myelopathy or radiculopathy, cervical region: Secondary | ICD-10-CM

## 2013-03-14 DIAGNOSIS — K219 Gastro-esophageal reflux disease without esophagitis: Secondary | ICD-10-CM

## 2013-03-14 DIAGNOSIS — K449 Diaphragmatic hernia without obstruction or gangrene: Secondary | ICD-10-CM

## 2013-03-14 DIAGNOSIS — F450 Somatization disorder: Secondary | ICD-10-CM

## 2013-03-14 DIAGNOSIS — Z853 Personal history of malignant neoplasm of breast: Secondary | ICD-10-CM

## 2013-03-14 DIAGNOSIS — R093 Abnormal sputum: Secondary | ICD-10-CM

## 2013-03-14 DIAGNOSIS — I10 Essential (primary) hypertension: Secondary | ICD-10-CM

## 2013-03-14 DIAGNOSIS — K5900 Constipation, unspecified: Secondary | ICD-10-CM

## 2013-03-14 DIAGNOSIS — M545 Low back pain, unspecified: Secondary | ICD-10-CM

## 2013-03-14 DIAGNOSIS — R0989 Other specified symptoms and signs involving the circulatory and respiratory systems: Secondary | ICD-10-CM

## 2013-03-14 DIAGNOSIS — M1990 Unspecified osteoarthritis, unspecified site: Secondary | ICD-10-CM

## 2013-03-14 NOTE — Patient Instructions (Addendum)
Today we updated your med list in our EPIC system...    Continue your current medications the same...    I recommend that you increase the Mucinex to 4 tabs per day w/ lots of fluids...  We gave you two sterile cups to collect representative sputum sample for [place] to cultur for germs...  Continue your follow up w/ DrCrossley & DrWhitfield...  Let's plan a follow up visit in 2-68mo, sooner if needed for problems.Marland KitchenMarland Kitchen

## 2013-03-14 NOTE — Progress Notes (Signed)
Subjective:    Patient ID: Heather [name], female    DOB: November 12, 1918, 79 y.o.   MRN: [id number]  HPI 79 y/o BF here for a follow up visit... she has multiple medical problems including:  Dyspnea & Anxiety;  HBP;  Hx CP followed by DrBensimhon;  HH/ Gastritis;  Divertics/ Constip;  Functional Abd Pain;  Renal cyst followed by DrKimbrough;  Hx left breast [phone];  DJD/ CSpine spondy/ LBP/ CTS; and she has a cancer phobia...  ~  December 29, 2011:  74mo ROV & [name] has her usual complement of somatic complaints> all seem stable w/o appreciable change; she does not want to see DrJEdwards again, but is happy w/ eval & communication from DrThorne at [facility] says her abd pain is coming from the arthritis in her back, & she has seen DrWhitfield for that... She lists mult OTC GI meds including> Prilosec, Miralax, Metamucil, Senakot, Mylicon...    BP controlled on her Coreg, Amlodipine, Lasix; BP= 120/68 today & denies CP/ palpit, ch in DOE, edema, etc...    She saw DrThorne recently for f/u & note pending; prev note 11/12 reviewed> she has done a thorough investigation...    She saw DrWhitfield 12/12 for f/u LBP w/ known DJD & severe sp stenosis L4-5 & neurogenic claudication; she did PT & has a brace, he referred her to [facility] for shots...  ~  Mar 29, 2012:  74mo ROV & she continues w/ mult complaints- ran out of Dymista sample for nasal symptoms (didn't help), thinks her BP is too low, c/o swelling (has VI- reminded to elim sodium, wear support hose, elevate, take Lasix40), "constip" but stool is loose & soft (rec- Align), saw DrEdwards again for GI- she denies dysphagia, still c/o chr abd discomfort, etc; c/o "dark urine" it was clear and C&S was neg; she requires constant reassurance about her condition & that she does not have cancer; asked to take her Xanax more regularly...  We reviewed prob list, meds, xrays and labs>  See below>>   ~  June 28, 2012:  74mo ROV & [name] continues to have mult  somatic complaints & says "terrible, terrible, terrible" states she's lost all the muscles in her abd & now uses a girdle; "all my doctors say for me to see you" eg> DrEdwards, DrNishan, etc; she still believes that she has cancer "somewhere- just like my friend"... Then she launches into a flight of ideas of complaints> carpel tunnel symptoms (she wants to f/u w/ DrWhtfield), cough/ phlegm, dizzy, "it's my stomach", etc...    We reviewed prob list, meds, xrays and labs> see below for updates >>  ~  August 03, 2012:  88mo ROV & add-on at pt's insistance for "hocking- it's not a cough, it's a hock"; then she proceeds to clear her throat forcefully & "hock-up" some clear phlegm; she notes "thick white foamy slimy sputum w/ flecks of chopped meat in it"; she denies cough/ hemoptysis/ fcs, etc; she notes some mild dyspnea (at baseline) and occas CP from the force of the hocking; she is quite insistent on additional work-up & Rx for this perceived problem:     Exam is clear- no oral lesions, neck is neg, chest is clear...    CXR shows sl overinflation, clear lungs, normal heart size w/ elongation & tortuous Ao...    PFTs are wnl> FVC=2.62 (128%), FEV1=2.20 (158%), %1sec=84; mid-flows=274% of predicted...    Note> she is quite anxious & has a cancer phobia; again asked to  take her Alprazolam Tid regularly... We reviewed prob list, meds, xrays and labs> see below for update >> We decided on a trial regimen to help her "hocking" w/ short term f/u to assess efficacy> MUCINEX 600mg - 2Bid w/ lots of fluids; HYCODAN syrup- 1tspQid; DULERA100/5- 1puffBid then rinse w/ Listerine... Note> before completing her eval today she insisted in a right breast exam stating that DrBertrand insisted that her PCP examine her breast at every visit> it was neg.  ~  August 30, 2012:  59mo ROV & recheck> "I'm just falling apart, meds not helping, Dulera choked me up, mouth glued together" therefore she stopped the [facility] & does  better on no Rx she says "there is something wrong w/ me, I'm losing so much weight, I'm skin & bones" wt today= 156# which is up 8# from last visit; she is c/o snorting phlegm that she says tastes bitter & has little white balls in it (she could not produce any sput despite a lot of hocking in the office today (advised to continue Mucinex600mg -2Bid w/ fluids)......  Also c/o left lat CWP, tender ribs & wears lumbar brace/ rib binder, +takes Hydrocodone/ Tramadol as needed...     We reviewed prob list, meds, xrays and labs> see below for updates >> she declines the 2013 flu vaccine... LABS 10/13:  Chems-wnl;  CBC- wnl;  TSH= 1.79  ~ September 27, 2012: 59mo ROV & [name] is rambling on w/ 'flight of ideas" today & due to this we tried a systems approach & touched on all these problems during her visit today>>     Resp symptoms> c/o SOB, hocking phlegm, "this throat business" for which she has seen DrRosen, ENT; now c/o decr hearing, thick phlegm, etc; We reviewed CXR 10/13- clear & wnl; PFT 10/13- normal airflow; Rec- Mucinex600mg -2Bid, lots of fluids, & f/u w/ ENT for hearing eval...     HBP> on Coreg3.125Bid, Amlod2.5, Lisin40; BP= 120/64 & we reviewed labs from 10/13- all wnl...     HxCP> on ASA81; followed by DrBensimhon/ Walker Kehr for Cards & their notes are reviewed=> neg ischemic work up...     VI, Edema> she went to the ER 11/13 w/ edema; VDopplers were neg for DVT; they reiterated the recs for no salt, elevation, support hose, & continue Lasix40.     GI- HH, Gastirits, Divertics, chronic pain & "swelling"> on Omep20- taking 2/d & we discussed derc to one daily; plus Miralax & Senakot-S...     Renal cyst> she is very anxious about everything & has a cancer phobia; DrKimbrough followed this benign simple cyst for yrs- no change...     Hx Breast cancer> she had left mod radical mastectomy 1994 by DrBlievernicht- no known recurrence & she is given reassurance...     Ortho- DJD, Cspine spongy, LBP,  CTS> followed by DrWhitfield; on Vicodin & Tramadol but prefers the latter she says...     Anxiety> on Alpraz0.5mg Tid but "I'm not taking it very often" she says & encouraged to use more regularly...  We reviewed prob list, meds, xrays and labs> see below for updates >>   ~  October 27, 2012:  59mo ROV & Glender has mult somatic complaints as usual- dry mouth at night, hocking thick phlegm, c/o stomach/ back/ etc... Again we reviewed the following problems during today's visit>>    Resp symptoms> on Mucinex2Bid +Fluids; she saw DrRosen, ENT 11/13- dysphagia, presbycusis, referral otalgia; exam was normal, barium swallow 11/13 showed diminished primary peristaltic wave, mild tertiary contractions,  prominent cricopharyngeus muscle, no hiatal hernia/ no reflux/ barium pill passed into the stomach without delay...    HBP> on Coreg3.125Bid, Amlod2.5, Lisin40; BP= 120/60 & she persists w/ mult somatic complaints...    HxCP> on ASA81; followed by DrBensimhon/ DrNishan for Cards & their notes are reviewed=> neg ischemic work up previously...     VI, Edema> she went to the ER 11/13 w/ edema; VDopplers were neg for DVT; they reiterated the recs for no salt, elevation, support hose, & continue Lasix40.     GI- HH, Gastirits, Divertics, chronic pain & "swelling"> on Omep20mg /d plus Miralax & Senakot-S; she states "my stomach has gone to my back".    Renal cyst> she is very anxious about everything & has a cancer phobia; DrKimbrough followed this benign simple cyst for yrs- no change...     Hx Breast cancer> she had left mod radical mastectomy 1994 by DrBlievernicht- no known recurrence & she is given reassurance...     Ortho- DJD, Cspine spondy, LBP, CTS> followed by DrWhitfield & he injected her right knee 1wk ago; on Vicodin & Tramadol but prefers the latter she says...     Anxiety> on Alpraz0.5mg Tid but "I'm not taking it very often" she says & encouraged to use more regularly...  We reviewed prob list, meds,  xrays and labs> see below for updates >>   ~  December 13, 2012:  6wk ROV & Jayli had a good holiday but launches into her usual litany of complaints> feels bad in the mornings, legs sore "they get hot", "I get hot in my back", c/o "hocking up little white balls of phlegm", stools are dark & small, etc... She thinks she needs massage for her back problems & has seen DrWhitfield w/ PT recommended... She is rec to increase Mucinex 600mg -2Bid w/ Fluids;  Not currently c/o dyspnea;  BP well controlled on meds;  She has chronic GI complaints managed by DrStark & she had no specific GI complaints today;  She has Vicodin & Tramadol for pain;  She has Therapist, sports for anxiety & rec (again) to take it more regularly...     We reviewed prob list, meds, xrays and labs> see below for updates >>   ~  January 07, 2013:  3-4wk ROV & add-on for multiple somatic complaints> MsHarris insisted on this add-on visit due to mult somatic complaints & when asked what specifically she was complaining about she launched into a flight of ideas all over the place> stiff; worse & worse; phlegm w/ white flecks & she cannot recalll our mult prev discussions about this w/ rec for Mucinex-2Bid & lots of fluids; legs jerking (rec for tonic water at bedtime); "my back is on fire" esp at night (rec to try Gabapentin 100mg  1-2 at bedtime); wt loss w/ fair appetite (rec to drink Ensure 2 cans/d betw meals); wants to try massage therapy (I told her ok w/ me)...  ~  March 14, 2013:  59mo ROV & Charonda persists w/ mult somatic complaints and seems almost inconsolable regarding her health; recall that she has a cancer phobia & she has been impossible to reassure in this regard no matter how many tests we run or specialists we send her to... Today she is concerned about "losing weight right & left" but our scale says she has gained 2# to 147# & her BMI= 28-29;  She continues to c/o harking phlegm & congestion ("thick lumpy slimy balls of white meat"),  states her urine is very yellow in  the evening, and her left arm & right leg are swelling... She is also c/o back pain & she has seen DrWhitfield, Berneice Gandy 4 times since I saw her last- XRays w/ extensive multilevel DDD in spine, Bone Scan w/o boney mets, "I'm getting therapy"... We discussed checking Sput C&S (NTF only), and increasing her Mucinex600mg  to 2Bid + fluids; her recent UA & cult were neg as well...    BP remains well controlled on her ;  GI is stable on Prilosec & laxatives, followed by DrJEdwards;  She has Xanax for anxiety but she still won't take it regularly as I havew recommended to her...     We reviewed prob list, meds, xrays and labs> see below for updates >>            Problem List:  RESPIRATORY SYMPTOMS >> c/o "hocking phlegm" DYSPNEA (ICD-786.9) - eval 5/11 c/o SOB- can't get a DB, can't get enough air in, etc... occurs at rest & w/ exerc "I'm very active despite my arthritis", talks rapidly in long sentences w/o apparent distress... no cough, phlegm, hemoptysis, etc- but as usual she is quite distressed by these symptoms and wants further eval> we discussed re-assessment w/ CXR (clear, NAD);  PFT (totally norm airflow);  Labs (all WNL);  Rx w/ ALPRAZOLAM 0.5mg  Tid & encouraged to take regularly. ~  CXR 4/13 showed norm heart size, clear lungs, mild TSpine spondylosis... ~  She continues to complain of drainage, throat symptoms, etc; Rx w/ Mucinex, OTC antihist, Nasonex, etc;  Seen by ENT, DrRosen w/ ?xerostomia & rec incr water etc. ~  9/13: Add-on visit for "hocking, it's not a cough, it's a hock"> see above... ~  9/13: CXR shows sl overinflation, clear lungs, normal heart size w/ elongation & tortuous Ao;  PFTs are wnl> FVC=2.62 (128%), FEV1=2.20 (158%), %1sec=84; mid-flows=274% of predicted... We discussed trial rx for her symptoms- Mucinex, Fluids, Hycodan, Dulera100- 1puffbid. ~  10/13: she reports INTOL to [facility] w/ throat & mouth symptoms "like glue" therefore stopped  this med; advised to continue the others. ~  4/14: she is asked to increase the Mucinex600mg  to 2Bid + plenty of water daily...  HYPERTENSION (ICD-401.9) - on AMLODIPINE 2.5mg /d,  COREG 3.125mg Bid,  LASIX 40mg - taking 1/2tab/d because 1 tab makes her too weak...  ~  7/12:  C/o incr edema in legs & advised to elim sodium from diet & incr her LASIX to 40mg /d... ~  10/12:  Prev Cozaar 50mg  was stopped 10/12 via the ER when she presented w/ ?angioedema of the tongue... ~  2/13:  BP= 120/68 & doing well on 3 med regimen & off Losartan. ~  5/13:  BP= 110/60 & she has mult somatic complaints but denies HA, CP, palpit, ch in SOB or edema... ~  8/13:  BP= 112/58 & there is no change in her mult somatic complaints... ~  9/13:  BP= 100/60 on Coreg3.125Bid & Amlod2.5 & prn Lasix... ~  10/13:  BP= 120/58 & she has mult somatic complaints as before... ~ 11/13: on Coreg3.125Bid, Amlod2.5, Lisin40; BP= 120/64 & we reviewed labs from 10/13- all wnl...  ~  12/13: on Coreg3.125Bid, Amlod2.5, Lisin40; BP= 120/60 & she persists w/ mult somatic complaints... ~  4/14: on Coreg3.125Bid, Amlod2.5, Lisin40; BP= 124/60 & she persists w/ mult somatic complaints.  Hx of CHEST PAIN (ICD-786.50) - she takes ASA 81mg /d + Tylenol Prn... followed by DrBensimhon for Cards. ~  Baseline EKG w/ RBBB & LAD, no acute changes... ~  NuclearStressTest  done 2007 & 2009 showed normal- without scar or ischemia, EF=67%...   ~  2DEcho 7/08 showed mild AI, mild diastolic dysfunction, EF=55-60%. ~  CT Abd 1/09 & 9/12 showed incidental coronary calcif, & aorto-iliac atherosclerotic changes as well... ~  8/13: she saw DrNishan> HBP, DD, atypCP w/ neg Myoviews & no changes made... ~  10/13:  She uses a back brace/ abd binder to help her CWP...  VENOUS INSUFFICIENCY (ICD-459.81) - she has mod VI and follows a low sodium diet, elevates legs when able, and wears support hose. ~  7/12:  C/o incr edema & we reviewed a 2gm sodium diet & increased her  Lasix from 20mg /d to 40mg /d... ~  5/13:  She has persist complaints but mild chr VI w/ min edema & reminded to elim sodium, elev, support hose, take the Lasix... ~ 11/13: she went to the ER w/ edema; VDopplers were neg for DVT; they reiterated the recs for no salt, elevation, support hose, & continue Lasix40.  HIATAL HERNIA (ICD-553.3) & GASTRITIS (ICD-535.50) - on OMEPRAZOLE 20mg /d & followed by DrJEdwards for GI having fired DrStark & [facility] in past. ~  last EGD was 2/08 w/ 3cm HH & gastitis... ~  CT Abd 1/09 revealed calcif gallstones, & mild ectasia of AA... ~  CT Abd 1/11 showed left colon divertics, tiny hepatic lesions w/o change & right renal cyst stable. ~  CT Abd 9/12 showed divertics (no inflamm), right renal & left hep lobe cysts, atherosclerotic changes, NAD... ~  Seen by DrEdwards 4/13> c/o dysphagia & congestion; he notes mult GI complaints, known esoph dysmotility w/o stricture, hard to pin down her complaints...  DIVERTICULOSIS, COLON (ICD-562.10) - followed regularly by DrStark. ~  colonoscopy 7/03 by DrStark showing divertics only... we discussed Miralax + Senakot-S for constipation. ~  colonoscopy 2/11 showed divertics, 3mm polyp, hems...  ? of ABDOMINAL PAIN & SYMPTOMATOLOGY - she had a very thorough work up in 2009> she has a cancer phobia and is very difficult to reassure her that everything appropriate has been done & no cancer found... Mult additional GI evals since then from [facility], [facility] (DrEdwards), [facility] (DrThorne)... ~  eval by GI- DrStark 12/08 but pt wasn't satisfied w/ his examination... ~  full labs 1/09 were WNL, sed=20... ~  CT Abd/ Pelvis 1/09= no acute abn in abd (calcif gallstones & mild ectasia of AA), calcif fibroid in the pelvis, & ?regarding the left ovary. ~  referral to DrFontaine, GYN> his notes are reviewed... ~  follow up w/ DrKimbrough, Urology> his notes are reviewed... ~  eval DrWhitfield for Orthopedics- rec shots in [facility] by [facility] w/  some improvement. ~  repeat GI eval 1/11 by DrStark w/ CT & colonoscpy as above- no acute problems. ~  5/11 & 7/11 >> persist complaints w/ neg recheck by DrStark & DrKimbrough... ~  12/11:  routine GYN surveillance DrFontaine found anteverted uterus w/ fibroids, atrophic ovaries bilat, no mass. ~  Repeat GI eval 4/12 by DrStark> he tried to reassure the pt, felt some symptoms might be from poor abd wall muscle tone, rec continue Omep, laxatives... ~  6/12:  She sought another GI opinion DrEdwards> he did not feel she needed any additional testing... ~  She tells me she has yet another appt to see a gastroenterologist at [facility]==> seen by DrThorne 8/12 & note reviewed... ~  EGD by DrThorne 9/12 w/ mult whitish nodular plaques in cardia & fundus> Bx results pending & she is  very concerned (remember her hx cancer phobia). ~  She continues to f/u w/ DrTh[facility] at [facility] & is happy w/ her eval...  RENAL CYST (ICD-593.2) - she has a 1 cm right renal cyst seen on CT in 2007 and followed by DrKimbrough... f/u CT Abd 1/09 - no change in the cyst... f/u renal ultrasound 1/10 w/ small bilat cysts... f/u CT Abd 1/11- no change in cyst. ~  she saw DrKimbrough 7/11 for LBP, microhematuria, urge incontinence- stable, no additional Rx. ~  she saw DrKimbrough 1/12 for urge incontinence, freq, nocturia> rec Desmopressin 0.2mg - 1/2 tab Qhs.  CARCINOMA, BREAST, LEFT (ICD-174.9) - Surgery 9/94 by DrBlievernicht w/ Left modified radical mastectomy... +estrogen receptors and Rx w/ Tamoxifen... f/u mammograms all neg... breast exam on right has been neg- no nodules palpated... She says that DrBertrand insisted that her PCP check her right breast at each & every office visit to be sure she doesn't develop any nodules...  DEGENERATIVE JOINT DISEASE (ICD-715.90) - on OTC meds + VICODIN Prn... she has end-stage OA of Right Knee per DrWhitfield treated w/ shots in the past... she has a knee brace and a cane for ambulation... ~  8/13:   She wants a better pain pill & we discussed change to [facility] 5/325 Tid prn... ~  12/13: she tells me that DrWhitfield injected her knee last wk- improved...  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 Rx w/ rest, heat, Percocet, and f/u w/ Ortho...  Hx of LOW BACK PAIN, CHRONIC (ICD-724.2) - eval DrWhitfield w/ rec for shots by DrNewton & she's feeling sl better & wears back brace... ~  5/10: mult somatic complaints- primarily c/o left side/ postero-lat rib pain...  Exam w/ tender left lat/ post ribs... Bone Scan= neg... ~  MRI Lumbar spine 7/10 by DrWhitfield... ~  Epidural steroid shot by DrNewton 8/10 & 1/11 ~  Repeat LBP eval by DrWhitfield w/ another MRI lumbar spine showing degenerative changes w/ mild progression since 7/10 MRI, mild-mod sp stenosis & lat recess stenosis at several levels ~  DrWhitfield referred her to [facility] to consider more shots but she is not so inclined... ~  6/13: she saw DrTooke w/ lumbar spondylosis; he felt that he had nothing to offer her & rec incr Tramadol to Qid... ~  2014:  She has had numerous visits w/ DrWhitfield for injections, XRays, scans etc...  CARPAL TUNNEL SYNDROME, BILATERAL (ICD-354.0) - she had prev right carpal tunnel release by DrWhitfield and was c/o increasing discomfort in her left wrist despite wrist splint Qhs- had left carpal tunnel release 1/10 by DrWhitfield...  ANXIETY (ICD-300.00) - on ALPRAZOLAM 0.5mg  Tid & encouraged to take it regularly... she has a cancer phobia & it is very difficult to reassure her regarding her symptoms and our evaluations... ~  7/13: she went to the ER c/o swelling of the tongue but eval by EDP indicated that her tongue was normal & not swollen, she was not on an ACE, given Pred Rx anyway. ~  She has been asked to incr the ALprazolam to regular dosing but she is reluctant...   Past Surgical History  Procedure Laterality Date  .  Left mastectomy    . Carpal tunnel release      Outpatient Encounter Prescriptions as of 03/14/2013  Medication Sig Dispense Refill  . ALPRAZolam (XANAX) 0.5 MG tablet Take 1 tablet (0.5 mg total) by mouth 3 (three) times daily as needed. For  anxiety  90 tablet  5  . amLODipine (NORVASC) 2.5 MG tablet Take 2.5 mg by mouth daily.       Marland Kitchen aspirin EC 81 MG tablet Take 81 mg by mouth daily.      . carvedilol (COREG) 3.125 MG tablet Take 3.125 mg by mouth 2 (two) times daily with a meal. For blood pressure      . Cholecalciferol (VITAMIN D3) 1000 UNITS CAPS Take 1 capsule by mouth daily. For your bones      . furosemide (LASIX) 40 MG tablet Take 1 tablet (40 mg total) by mouth daily. For swelling  30 tablet  6  . guaiFENesin (MUCINEX) 600 MG 12 hr tablet Take 1,200 mg by mouth 2 (two) times daily. For congestion      . HYDROcodone-acetaminophen (NORCO) 5-325 MG per tablet 1 tablet by mouth three times daily as needed for pain  90 tablet  5  . HYDROcodone-acetaminophen (NORCO) 5-325 MG per tablet Take 1 tablet by mouth every 6 (six) hours as needed for pain.  16 tablet  0  . Multiple Vitamins-Minerals (MULTIVITAMINS THER. W/MINERALS) TABS Take 1 tablet by mouth daily.      Marland Kitchen omeprazole (PRILOSEC) 20 MG capsule Take 1 by mouth 30 min before the first meal of the day      . polyethylene glycol (MIRALAX / GLYCOLAX) packet Take 17 g by mouth daily as needed. For constipation      . sennosides-docusate sodium (SENOKOT-S) 8.6-50 MG tablet Take 1 tablet by mouth daily as needed. For constipation      . traMADol (ULTRAM) 50 MG tablet Take 1 tablet (50 mg total) by mouth every 6 (six) hours as needed for pain. As needed for pain  30 tablet  5   No facility-administered encounter medications on file as of 03/14/2013.    Allergies  Allergen Reactions  . Fentanyl     REACTION: nausea    Current Medications, Allergies, Past Medical History, Past Surgical History, Family History, and Social History were  reviewed in [employer] record.    Review of Systems      See HPI - all other systems neg except as noted... She has mult somatic complaints and anxiety. The patient complains of decreased hearing, chest discomfort, dyspnea on exertion, abdominal pain, incontinence, muscle weakness, and difficulty walking.  The patient denies anorexia, fever, vision loss, hoarseness, syncope, peripheral edema, prolonged cough, headaches, hemoptysis, melena, hematochezia, severe indigestion/heartburn, hematuria, transient blindness, depression, abnormal bleeding, enlarged lymph nodes, and angioedema.     Objective:   Physical Exam   WD, WN, 79 y/o BF in NAD... she is very anxious... GENERAL:  Alert & oriented; pleasant & cooperative... HEENT:  [place]/AT, EOM-wnl, PERRLA, EACs-clear, TMs-wnl, NOSE-clear, THROAT-clear & wnl, no lesions seen. NECK:  Neck ROM decreased, no JVD; normal carotid impulses w/o bruits; no thyromegaly or nodules palpated; no lymphadenopathy. CHEST:  Clear to P & A; without wheezes/ rales/ or rhonchi heard... + tender right lat 11th & 12th ribs on palp. BREAST:  s/p left mastectomy... right breast normal- without nodules palpated... HEART:  Regular Rhythm; without murmurs/ rubs/ or gallops detected... ABDOMEN:  Soft & nontender; normal bowel sounds; no organomegaly or masses palpated... EXT: without deformities, mod arthritic changes; no varicose veins/ +venous insuffic/ tr edema. NEURO:  CN's intact; no focal neuro deficits... DERM:  Small sl excoriated area of dermatitis left shin- no drainage,no rash, etc...  RADIOLOGY DATA:  Reviewed in the EPIC EMR & discussed  w/ the patient...  LABORATORY DATA:  Reviewed in the EPIC EMR & discussed w/ the patient...   Assessment & Plan:    RESPIRATORY SYMPTOMS w/ Dyspnea & throat clearing>  She had a neg pulm eval 2011 as above; she insisted on additional eval 9/13 for her chr throat clearing symptom; CXR w/o acute changes  & PFTs normal for her 79 y/o lungs; she is quite insistent on med rx for her problem & we reviewed Mucinex, Hycodan, Dulera, & Listerine (she was intol to the [facility]); pt very anxious & Alprazolam seems to help when she takes it...  HBP>  Controlled on low doses of several meds including Coreg, Norvasc & Lasix;  She is encouraged to take meds regularly...  Hx CP> evaluated for Cards by DrBensimon & he has not been in favor of invasive testing; stable on conservative med rx...  VI/ Edema>  She is concerned about incr edema & we reviewed 2gm sodium diet & rec keep Lasix at 40mg /d...  GI> HH, Gastritis, Divertics, vague abd pain>  I tried once again to reassure her about the thorough GI evaluations that her has undergone; she is happy w/ DrThorne's eval & will f/u w/ her & in the meanwhile continue rx w/ [id number], Miralax, Metamucil, Senakot, Mylicon, etc...  Renal cyst>  This is benign & has been followed by DrKimbrough...  Hx left Breast Cancer>  No known recurrence...  DJD> Cervical spondylosis & chronic LBP>  She has been evaluated by DrWhitfield for Ortho & given epid steroid shots by DrNewton... She wants a better pain pill & we tried [facility] 5/325 tid as needed but she prefers the Tramadol; knee shots from DrWhitfield helped...  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  Anxiety>  Asked once again to increase the Alprazolam to 1/2 - 1 tab TID regularly...   Patient's Medications  New Prescriptions   No medications on file  Previous Medications   ALPRAZOLAM (XANAX) 0.5 MG TABLET    Take 1 tablet (0.5 mg total) by mouth 3 (three) times daily as needed. For anxiety   AMLODIPINE (NORVASC) 2.5 MG TABLET    Take 2.5 mg by mouth daily.    ASPIRIN EC 81 MG TABLET    Take 81 mg by mouth daily.   CARVEDILOL (COREG) 3.125 MG TABLET    Take 3.125 mg by mouth 2 (two) times daily with a meal. For blood pressure   CHOLECALCIFEROL (VITAMIN D3)  1000 UNITS CAPS    Take 1 capsule by mouth daily. For your bones   FUROSEMIDE (LASIX) 40 MG TABLET    Take 1 tablet (40 mg total) by mouth daily. For swelling   GUAIFENESIN (MUCINEX) 600 MG 12 HR TABLET    Take 1,200 mg by mouth daily. For congestion   HYDROCODONE-ACETAMINOPHEN (NORCO) 5-325 MG PER TABLET    1 tablet by mouth three times daily as needed for pain   MULTIPLE VITAMINS-MINERALS (MULTIVITAMINS THER. W/MINERALS) TABS    Take 1 tablet by mouth daily.   OMEPRAZOLE (PRILOSEC) 20 MG CAPSULE    Take 1 by mouth 30 min before the first meal of the day   POLYETHYLENE GLYCOL (MIRALAX / GLYCOLAX) PACKET    Take 17 g by mouth daily as needed. For constipation   SENNOSIDES-DOCUSATE SODIUM (SENOKOT-S) 8.6-50 MG TABLET    Take 1 tablet by mouth daily as needed. For constipation   TRAMADOL (ULTRAM) 50 MG TABLET  Take 1 tablet (50 mg total) by mouth every 6 (six) hours as needed for pain. As needed for pain  Modified Medications   No medications on file  Discontinued Medications   HYDROCODONE-ACETAMINOPHEN (NORCO) 5-325 MG PER TABLET    Take 1 tablet by mouth every 6 (six) hours as needed for pain.

## 2013-03-28 ENCOUNTER — Other Ambulatory Visit: Payer: Medicare Other

## 2013-03-28 DIAGNOSIS — R0989 Other specified symptoms and signs involving the circulatory and respiratory systems: Secondary | ICD-10-CM

## 2013-03-28 DIAGNOSIS — R0609 Other forms of dyspnea: Secondary | ICD-10-CM

## 2013-05-03 ENCOUNTER — Telehealth: Payer: Self-pay | Admitting: Pulmonary Disease

## 2013-05-03 NOTE — Telephone Encounter (Signed)
Per SN---  Dr. Esmeralda Arthur is the expert---do what he recs for the phlegm in her throat and follow up with him.   Keep it up---3 meals daily, 2 boost.  Keep follow up appt.  thanks

## 2013-05-03 NOTE — Telephone Encounter (Signed)
[email or web address].Thelia Tanksley, CMA  

## 2013-05-03 NOTE — Telephone Encounter (Signed)
I spoke with the pt and she states that she is still losing weight and is concerned with this. Pt weighed while I was on the phone and it was 138#. Pt states she is drinking 2 whole bottles of Boost a day and eating three meals a day. Pt is asking for recs. Pt also states that she is still having a lot of phlegm in her throat and coughing this up. I advised the pt that per last phone note she was to take 4 tabs of mucinex daily, but the pt states she saw dr. Esmeralda Arthur and he told her that was too much to only take twice a day. I advised the pt then that she needs to f/u with Dr. Esmeralda Arthur about phlegm in throat.   Please advise about weight loss. [name], CMA Allergies  Allergen Reactions  . Fentanyl     REACTION: nausea

## 2013-05-03 NOTE — Telephone Encounter (Signed)
Pt returned call. Heather [name] °

## 2013-05-03 NOTE — Telephone Encounter (Signed)
[email or web address].Heather [name], CMA  

## 2013-05-04 NOTE — Telephone Encounter (Signed)
ATC Line Busy x 2 - Will try again later

## 2013-05-05 NOTE — Telephone Encounter (Signed)
Pt aware to f/u with Dr Esmeralda Arthur per SN recs.  Pt c/o increased swelling in lower legs/feet x 2 months and this has worsened over the past day. Pt states that she cannot get a shoe on her feet as of today.  Would like to know who she can see to f/u with her on this. Pt states that she has taken a whole 40mg  fluid pill today and the swelling is still bad.   Allergies  Allergen Reactions  . Fentanyl     REACTION: nausea  [facility]  Please advise Dr Kriste Basque, thanks.

## 2013-05-05 NOTE — Telephone Encounter (Signed)
Per SN---   Take the daily lasix 40 mg  Every morning and try to elevate her legs. No salt in her diet Wear her support hose daily.    Pt is aware to keep her appt with SN on 7/14.  Called and spoke with pt and she is aware of SN recs.  Nothing further is needed.

## 2013-05-05 NOTE — Telephone Encounter (Signed)
lmtcb

## 2013-05-06 ENCOUNTER — Other Ambulatory Visit: Payer: Self-pay | Admitting: Pulmonary Disease

## 2013-05-18 ENCOUNTER — Telehealth: Payer: Self-pay | Admitting: Pulmonary Disease

## 2013-05-18 NOTE — Telephone Encounter (Signed)
Called spoke with patient who reports right lower leg pain x2-3 months, worse x2 days especially at night and with walking Pt stated this leg is darker in color "down toward the ankle up to the middle part of her leg" and she notes "some of the veins are swollen in there" Swelling is improved since 6.17.14 phone note with lasix recs Hurts even with light rubbing Pt is wearing her support hose daily Pt wants SN to look at her leg; she does not want any medications > does SN want her to see Dr Cleophas Dunker?  Dr Kriste Basque please advise, thank you.

## 2013-05-18 NOTE — Telephone Encounter (Signed)
Called spoke with patient, advised of SN's recs as stated below.  Pt okay with these recommendations and verbalized her understanding.  Pt to call Dr Hoy Register office for ov.  Nothing further needed at this time; will sign off.

## 2013-05-18 NOTE — Telephone Encounter (Signed)
Per SN---  These are venous insufficiency changes.     She should cont to follow up with Dr. Cleophas Dunker and rov to check legs at the next ov.

## 2013-05-30 ENCOUNTER — Encounter: Payer: Self-pay | Admitting: Pulmonary Disease

## 2013-05-30 ENCOUNTER — Ambulatory Visit (INDEPENDENT_AMBULATORY_CARE_PROVIDER_SITE_OTHER): Payer: Self-pay | Admitting: Pulmonary Disease

## 2013-05-30 VITALS — BP 112/58 | HR 71 | Temp 98.2°F | Ht 62.0 in | Wt 148.4 lb

## 2013-05-30 DIAGNOSIS — R109 Unspecified abdominal pain: Secondary | ICD-10-CM

## 2013-05-30 DIAGNOSIS — F411 Generalized anxiety disorder: Secondary | ICD-10-CM

## 2013-05-30 DIAGNOSIS — J42 Unspecified chronic bronchitis: Secondary | ICD-10-CM

## 2013-05-30 DIAGNOSIS — K219 Gastro-esophageal reflux disease without esophagitis: Secondary | ICD-10-CM

## 2013-05-30 DIAGNOSIS — M1990 Unspecified osteoarthritis, unspecified site: Secondary | ICD-10-CM

## 2013-05-30 DIAGNOSIS — K5730 Diverticulosis of large intestine without perforation or abscess without bleeding: Secondary | ICD-10-CM

## 2013-05-30 DIAGNOSIS — M47812 Spondylosis without myelopathy or radiculopathy, cervical region: Secondary | ICD-10-CM

## 2013-05-30 DIAGNOSIS — K449 Diaphragmatic hernia without obstruction or gangrene: Secondary | ICD-10-CM

## 2013-05-30 DIAGNOSIS — R609 Edema, unspecified: Secondary | ICD-10-CM

## 2013-05-30 DIAGNOSIS — M545 Low back pain, unspecified: Secondary | ICD-10-CM

## 2013-05-30 DIAGNOSIS — I10 Essential (primary) hypertension: Secondary | ICD-10-CM

## 2013-05-30 DIAGNOSIS — I872 Venous insufficiency (chronic) (peripheral): Secondary | ICD-10-CM

## 2013-05-30 DIAGNOSIS — Z853 Personal history of malignant neoplasm of breast: Secondary | ICD-10-CM

## 2013-05-30 NOTE — Progress Notes (Signed)
Subjective:    Patient ID: Heather [name], female    DOB: 05/04/1918, 79 y.o.   MRN: [id number]  HPI 79 y/o BF here for a follow up visit... she has multiple medical problems including:  Dyspnea & Anxiety;  HBP;  Hx CP followed by DrBensimhon;  HH/ Gastritis;  Divertics/ Constip;  Functional Abd Pain;  Renal cyst followed by DrKimbrough;  Hx left breast [phone];  DJD/ CSpine spondy/ LBP/ CTS; and she has a cancer phobia...  ~  December 29, 2011:  60mo ROV & [name] has her usual complement of somatic complaints> all seem stable w/o appreciable change; she does not want to see DrJEdwards again, but is happy w/ eval & communication from DrThorne at [facility], The says her abd pain is coming from the arthritis in her back, & she has seen DrWhitfield for that... She lists mult OTC GI meds including> Prilosec, Miralax, Metamucil, Senakot, Mylicon...    BP controlled on her Coreg, Amlodipine, Lasix; BP= 120/68 today & denies CP/ palpit, ch in DOE, edema, etc...    She saw DrThorne recently for f/u & note pending; prev note 11/12 reviewed> she has done a thorough investigation...    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  ~  Mar 29, 2012:  60mo ROV & she continues w/ mult complaints- ran out of Dymista sample for nasal symptoms (didn't help), thinks her BP is too low, c/o swelling (has VI- reminded to elim sodium, wear support hose, elevate, take Lasix40), "constip" but stool is loose & soft (rec- Align), saw DrEdwards again for GI- she denies dysphagia, still c/o chr abd discomfort, etc; c/o "dark urine" it was clear and C&S was neg; she requires constant reassurance about her condition & that she does not have cancer; asked to take her Xanax more regularly...  We reviewed prob list, meds, xrays and labs>  See below>>   ~  June 28, 2012:  60mo ROV & Leveda continues to have mult  somatic complaints & says "terrible, terrible, terrible" states she's lost all the muscles in her abd & now uses a girdle; "all my doctors say for me to see you" eg> DrEdwards, DrNishan, etc; she still believes that she has cancer "somewhere- just like my friend"... Then she launches into a flight of ideas of complaints> carpel tunnel symptoms (she wants to f/u w/ DrWhtfield), cough/ phlegm, dizzy, "it's my stomach", etc...    We reviewed prob list, meds, xrays and labs> see below for updates >>  ~  August 03, 2012:  60mo ROV & add-on at pt's insistance for "hocking- it's not a cough, it's a hock"; then she proceeds to clear her throat forcefully & "hock-up" some clear phlegm; she notes "thick white foamy slimy sputum w/ flecks of chopped meat in it"; she denies cough/ hemoptysis/ fcs, etc; she notes some mild dyspnea (at baseline) and occas CP from the force of the hocking; she is quite insistent on additional work-up & Rx for this perceived problem:     Exam is clear- no oral lesions, neck is neg, chest is clear...    CXR shows sl overinflation, clear lungs, normal heart size w/ elongation & tortuous Ao...    PFTs are wnl> FVC=2.62 (128%), FEV1=2.20 (158%), %1sec=84; mid-flows=274% of predicted...    Note> she is quite anxious & has a cancer phobia; again asked to  take her Alprazolam Tid regularly... We reviewed prob list, meds, xrays and labs> see below for update >> We decided on a trial regimen to help her "hocking" w/ short term f/u to assess efficacy> MUCINEX 600mg - 2Bid w/ lots of fluids; HYCODAN syrup- 1tspQid; DULERA100/5- 1puffBid then rinse w/ Listerine... Note> before completing her eval today she insisted in a right breast exam stating that DrBertrand insisted that her PCP examine her breast at every visit> it was neg.  ~  August 30, 2012:  42mo ROV & recheck> "I'm just falling apart, meds not helping, Dulera choked me up, mouth glued together" therefore she stopped the [facility] & does  better on no Rx she says "there is something wrong w/ me, I'm losing so much weight, I'm skin & bones" wt today= 156# which is up 8# from last visit; she is c/o snorting phlegm that she says tastes bitter & has little white balls in it (she could not produce any sput despite a lot of hocking in the office today (advised to continue Mucinex600mg -2Bid w/ fluids)......  Also c/o left lat CWP, tender ribs & wears lumbar brace/ rib binder, +takes Hydrocodone/ Tramadol as needed...     We reviewed prob list, meds, xrays and labs> see below for updates >> she declines the 2013 flu vaccine... LABS 10/13:  Chems-wnl;  CBC- wnl;  TSH= 1.79  ~ September 27, 2012: 42mo ROV & Gerardine is rambling on w/ 'flight of ideas" today & due to this we tried a systems approach & touched on all these problems during her visit today>>     Resp symptoms> c/o SOB, hocking phlegm, "this throat business" for which she has seen DrRosen, ENT; now c/o decr hearing, thick phlegm, etc; We reviewed CXR 10/13- clear & wnl; PFT 10/13- normal airflow; Rec- Mucinex600mg -2Bid, lots of fluids, & f/u w/ ENT for hearing eval...     HBP> on Coreg3.125Bid, Amlod2.5, Lisin40; BP= 120/64 & we reviewed labs from 10/13- all wnl...     HxCP> on ASA81; followed by DrBensimhon/ Walker Kehr for Cards & their notes are reviewed=> neg ischemic work up...     VI, Edema> she went to the ER 11/13 w/ edema; VDopplers were neg for DVT; they reiterated the recs for no salt, elevation, support hose, & continue Lasix40.     GI- HH, Gastirits, Divertics, chronic pain & "swelling"> on Omep20- taking 2/d & we discussed derc to one daily; plus Miralax & Senakot-S...     Renal cyst> she is very anxious about everything & has a cancer phobia; DrKimbrough followed this benign simple cyst for yrs- no change...     Hx Breast cancer> she had left mod radical mastectomy 1994 by DrBlievernicht- no known recurrence & she is given reassurance...     Ortho- DJD, Cspine spongy, LBP,  CTS> followed by DrWhitfield; on Vicodin & Tramadol but prefers the latter she says...     Anxiety> on Alpraz0.5mg Tid but "I'm not taking it very often" she says & encouraged to use more regularly...  We reviewed prob list, meds, xrays and labs> see below for updates >>   ~  October 27, 2012:  42mo ROV & Vi has mult somatic complaints as usual- dry mouth at night, hocking thick phlegm, c/o stomach/ back/ etc... Again we reviewed the following problems during today's visit>>    Resp symptoms> on Mucinex2Bid +Fluids; she saw DrRosen, ENT 11/13- dysphagia, presbycusis, referral otalgia; exam was normal, barium swallow 11/13 showed diminished primary peristaltic wave, mild tertiary contractions,  prominent cricopharyngeus muscle, no hiatal hernia/ no reflux/ barium pill passed into the stomach without delay...    HBP> on Coreg3.125Bid, Amlod2.5, Lisin40; BP= 120/60 & she persists w/ mult somatic complaints...    HxCP> on ASA81; followed by DrBensimhon/ DrNishan for Cards & their notes are reviewed=> neg ischemic work up previously...     VI, Edema> she went to the ER 11/13 w/ edema; VDopplers were neg for DVT; they reiterated the recs for no salt, elevation, support hose, & continue Lasix40.     GI- HH, Gastirits, Divertics, chronic pain & "swelling"> on Omep20mg /d plus Miralax & Senakot-S; she states "my stomach has gone to my back".    Renal cyst> she is very anxious about everything & has a cancer phobia; DrKimbrough followed this benign simple cyst for yrs- no change...     Hx Breast cancer> she had left mod radical mastectomy 1994 by DrBlievernicht- no known recurrence & she is given reassurance...     Ortho- DJD, Cspine spondy, LBP, CTS> followed by DrWhitfield & he injected her right knee 1wk ago; on Vicodin & Tramadol but prefers the latter she says...     Anxiety> on Alpraz0.5mg Tid but "I'm not taking it very often" she says & encouraged to use more regularly...  We reviewed prob list, meds,  xrays and labs> see below for updates >>   ~  December 13, 2012:  6wk ROV & Dashauna had a good holiday but launches into her usual litany of complaints> feels bad in the mornings, legs sore "they get hot", "I get hot in my back", c/o "hocking up little white balls of phlegm", stools are dark & small, etc... She thinks she needs massage for her back problems & has seen DrWhitfield w/ PT recommended... She is rec to increase Mucinex 600mg -2Bid w/ Fluids;  Not currently c/o dyspnea;  BP well controlled on meds;  She has chronic GI complaints managed by DrStark & she had no specific GI complaints today;  She has Vicodin & Tramadol for pain;  She has Therapist, sports for anxiety & rec (again) to take it more regularly...     We reviewed prob list, meds, xrays and labs> see below for updates >>   ~  January 07, 2013:  3-4wk ROV & add-on for multiple somatic complaints> MsHarris insisted on this add-on visit due to mult somatic complaints & when asked what specifically she was complaining about she launched into a flight of ideas all over the place> stiff; worse & worse; phlegm w/ white flecks & she cannot recalll our mult prev discussions about this w/ rec for Mucinex-2Bid & lots of fluids; legs jerking (rec for tonic water at bedtime); "my back is on fire" esp at night (rec to try Gabapentin 100mg  1-2 at bedtime); wt loss w/ fair appetite (rec to drink Ensure 2 cans/d betw meals); wants to try massage therapy (I told her ok w/ me)...  ~  March 14, 2013:  77mo ROV & Tzippy persists w/ mult somatic complaints and seems almost inconsolable regarding her health; recall that she has a cancer phobia & she has been impossible to reassure in this regard no matter how many tests we run or specialists we send her to... Today she is concerned about "losing weight right & left" but our scale says she has gained 2# to 147# & her BMI= 28-29;  She continues to c/o harking phlegm & congestion ("thick lumpy slimy balls of white meat"),  states her urine is very yellow in  the evening, and her left arm & right leg are swelling... She is also c/o back pain & she has seen DrWhitfield, Berneice Gandy 4 times since I saw her last- XRays w/ extensive multilevel DDD in spine, Bone Scan w/o boney mets, "I'm getting therapy"... We discussed checking Sput C&S (NTF only), and increasing her Mucinex600mg  to 2Bid + fluids; her recent UA & cult were neg as well...    BP remains well controlled on her ;  GI is stable on Prilosec & laxatives, followed by DrJEdwards;  She has Xanax for anxiety but she still won't take it regularly as I havew recommended to her...     We reviewed prob list, meds, xrays and labs> see below for updates >>   ~  May 30, 2013:  2-76mo ROV & Kaylana is soon to be 79 y/o> stable without new complaints but says "terrible, terrible, terrible" & still mentions the perceived "swelling" in her left side of abd ("it's so big i have to hold it up" & asked to try an abd binder) and again mentions a mild cough productive of "little white balls of mucous"; she states "DrCroosley can't find where the phlegm is coming from, c/o "hocking little white balls that makes me sick";  She is on pain meds from [facility]- off Vicodin on Tramadol...     We reviewed prob list, meds, xrays and labs> see below for updates >>  She is rec to continue f/u w/ her specialists to get the reassurance she needs to know that everything possible is being done to address her concerns... Asked to take the Alpraz0.5mg Tid regularly...           Problem List:  RESPIRATORY SYMPTOMS >> c/o "hocking phlegm" DYSPNEA (ICD-786.9) - eval 5/11 c/o SOB- can't get a DB, can't get enough air in, etc... occurs at rest & w/ exerc "I'm very active despite my arthritis", talks rapidly in long sentences w/o apparent distress... no cough, phlegm, hemoptysis, etc- but as usual she is quite distressed by these symptoms and wants further eval> we discussed re-assessment w/ CXR (clear, NAD);   PFT (totally norm airflow);  Labs (all WNL);  Rx w/ ALPRAZOLAM 0.5mg  Tid & encouraged to take regularly. ~  CXR 4/13 showed norm heart size, clear lungs, mild TSpine spondylosis... ~  She continues to complain of drainage, throat symptoms, etc; Rx w/ Mucinex, OTC antihist, Nasonex, etc;  Seen by ENT, DrRosen w/ ?xerostomia & rec incr water etc. ~  9/13: Add-on visit for "hocking, it's not a cough, it's a hock"> see above... ~  9/13: CXR shows sl overinflation, clear lungs, normal heart size w/ elongation & tortuous Ao;  PFTs are wnl> FVC=2.62 (128%), FEV1=2.20 (158%), %1sec=84; mid-flows=274% of predicted... We discussed trial rx for her symptoms- Mucinex, Fluids, Hycodan, Dulera100- 1puffbid. ~  10/13: she reports INTOL to [facility] w/ throat & mouth symptoms "like glue" therefore stopped this med; advised to continue the others. ~  4/14: she is asked to increase the Mucinex600mg  to 2Bid + plenty of water daily...  HYPERTENSION (ICD-401.9) - on AMLODIPINE 2.5mg /d,  COREG 3.125mg Bid,  LASIX 40mg - taking 1/2tab/d because 1 tab makes her too weak...  ~  7/12:  C/o incr edema in legs & advised to elim sodium from diet & incr her LASIX to 40mg /d... ~  10/12:  Prev Cozaar 50mg  was stopped 10/12 via the ER when she presented w/ ?angioedema of the tongue... ~  2/13:  BP= 120/68 & doing well on 3 med regimen &  off Losartan. ~  5/13:  BP= 110/60 & she has mult somatic complaints but denies HA, CP, palpit, ch in SOB or edema... ~  8/13:  BP= 112/58 & there is no change in her mult somatic complaints... ~  9/13:  BP= 100/60 on Coreg3.125Bid & Amlod2.5 & prn Lasix... ~  10/13:  BP= 120/58 & she has mult somatic complaints as before... ~ 11/13: on Coreg3.125Bid, Amlod2.5, Lisin40; BP= 120/64 & we reviewed labs from 10/13- all wnl...  ~  12/13: on Coreg3.125Bid, Amlod2.5, Lisin40; BP= 120/60 & she persists w/ mult somatic complaints... ~  4/14: on Coreg3.125Bid, Amlod2.5, Lisin40; BP= 124/60 & she persists w/ mult  somatic complaints. ~  7/14:  On same , BP=112/58, & she is reassured...  Hx of CHEST PAIN (ICD-786.50) - she takes ASA 81mg /d + Tylenol Prn... followed by DrBensimhon for Cards. ~  Baseline EKG w/ RBBB & LAD, no acute changes... ~  NuclearStressTest done 2007 & 2009 showed normal- without scar or ischemia, EF=67%...   ~  2DEcho 7/08 showed mild AI, mild diastolic dysfunction, EF=55-60%. ~  CT Abd 1/09 & 9/12 showed incidental coronary calcif, & aorto-iliac atherosclerotic changes as well... ~  8/13: she saw DrNishan> HBP, DD, atypCP w/ neg Myoviews & no changes made... ~  10/13:  She uses a back brace/ abd binder to help her CWP...  VENOUS INSUFFICIENCY (ICD-459.81) - she has mod VI and follows a low sodium diet, elevates legs when able, and wears support hose. ~  7/12:  C/o incr edema & we reviewed a 2gm sodium diet & increased her Lasix from 20mg /d to 40mg /d... ~  5/13:  She has persist complaints but mild chr VI w/ min edema & reminded to elim sodium, elev, support hose, take the Lasix... ~ 11/13: she went to the ER w/ edema; VDopplers were neg for DVT; they reiterated the recs for no salt, elevation, support hose, & continue Lasix40.  HIATAL HERNIA (ICD-553.3) & GASTRITIS (ICD-535.50) - on OMEPRAZOLE 20mg /d & followed by DrJEdwards for GI having fired DrStark & [facility] in past. ~  last EGD was 2/08 w/ 3cm HH & gastitis... ~  CT Abd 1/09 revealed calcif gallstones, & mild ectasia of AA... ~  CT Abd 1/11 showed left colon divertics, tiny hepatic lesions w/o change & right renal cyst stable. ~  CT Abd 9/12 showed divertics (no inflamm), right renal & left hep lobe cysts, atherosclerotic changes, NAD... ~  Seen by DrEdwards 4/13> c/o dysphagia & congestion; he notes mult GI complaints, known esoph dysmotility w/o stricture, hard to pin down her complaints... ~  She has followed up w/ DrStark regarding her concerns about swelling in her abd...  DIVERTICULOSIS, COLON (ICD-562.10) - followed  regularly by DrStark. ~  colonoscopy 7/03 by DrStark showing divertics only... we discussed Miralax + Senakot-S for constipation. ~  colonoscopy 2/11 showed divertics, 3mm polyp, hems...  ? of ABDOMINAL PAIN & SYMPTOMATOLOGY - she had a very thorough work up in 2009> she has a cancer phobia and is very difficult to reassure her that everything appropriate has been done & no cancer found... Mult additional GI evals since then from [facility], [facility] (DrEdwards), [facility] (DrThorne)... ~  eval by GI- DrStark 12/08 but pt wasn't satisfied w/ his examination... ~  full labs 1/09 were WNL, sed=20... ~  CT Abd/ Pelvis 1/09= no acute abn in abd (calcif gallstones & mild ectasia of AA), calcif fibroid in the pelvis, & ?regarding the left ovary. ~  referral to DrFontaine, GYN> his notes are reviewed... ~  follow up w/ DrKimbrough, Urology> his notes are reviewed... ~  eval DrWhitfield for Orthopedics- rec shots in back by [facility] w/ some improvement. ~  repeat GI eval 1/11 by DrStark w/ CT & colonoscpy as above- no acute problems. ~  5/11 & 7/11 >> persist complaints w/ neg recheck by DrStark & DrKimbrough... ~  12/11:  routine GYN surveillance DrFontaine found anteverted uterus w/ fibroids, atrophic ovaries bilat, no mass. ~  Repeat GI eval 4/12 by DrStark> he tried to reassure the pt, felt some symptoms might be from poor abd wall muscle tone, rec continue Omep, laxatives... ~  6/12:  She sought another GI opinion DrEdwards> he did not feel she needed any additional testing... ~  She tells me she has yet another appt to see a gastroenterologist at [facility]==> seen by DrThorne 8/12 & note reviewed... ~  EGD by DrThorne 9/12 w/ mult whitish nodular plaques in cardia & fundus> Bx results pending & she is very concerned (remember her hx cancer phobia). ~  She continues to f/u w/ DrThorne at [facility] & is happy w/ her eval... ~  Now back w/ DrStark at [facility]...  RENAL CYST (ICD-593.2) - she has a 1 cm right  renal cyst seen on CT in 2007 and followed by DrKimbrough... f/u CT Abd 1/09 - no change in the cyst... f/u renal ultrasound 1/10 w/ small bilat cysts... f/u CT Abd 1/11- no change in cyst. ~  she saw DrKimbrough 7/11 for LBP, microhematuria, urge incontinence- stable, no additional Rx. ~  she saw DrKimbrough 1/12 for urge incontinence, freq, nocturia> rec Desmopressin 0.2mg - 1/2 tab Qhs.  CARCINOMA, BREAST, LEFT (ICD-174.9) - Surgery 9/94 by DrBlievernicht w/ Left modified radical mastectomy... +estrogen receptors and Rx w/ Tamoxifen... f/u mammograms all neg... breast exam on right has been neg- no nodules palpated... She says that DrBertrand insisted that her PCP check her right breast at each & every office visit to be sure she doesn't develop any nodules...  DEGENERATIVE JOINT DISEASE (ICD-715.90) - on OTC meds + VICODIN Prn... she has end-stage OA of Right Knee per DrWhitfield treated w/ shots in the past... she has a knee brace and a cane for ambulation... ~  8/13:  She wants a better pain pill & we discussed change to [facility] 5/325 Tid prn... ~  12/13: she tells me that DrWhitfield injected her knee last wk- improved...  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 Rx w/ rest, heat, Percocet, and f/u w/ Ortho...  Hx of LOW BACK PAIN, CHRONIC (ICD-724.2) - eval DrWhitfield w/ rec for shots by DrNewton & she's feeling sl better & wears back brace... ~  5/10: mult somatic complaints- primarily c/o left side/ postero-lat rib pain...  Exam w/ tender left lat/ post ribs... Bone Scan= neg... ~  MRI Lumbar spine 7/10 by DrWhitfield... ~  Epidural steroid shot by DrNewton 8/10 & 1/11 ~  Repeat LBP eval by DrWhitfield w/ another MRI lumbar spine showing degenerative changes w/ mild progression since 7/10 MRI, mild-mod sp stenosis & lat recess stenosis at several levels ~  DrWhitfield referred her to [facility] to consider more  shots but she is not so inclined... ~  6/13: she saw DrTooke w/ lumbar spondylosis; he felt that he had nothing to offer her & rec incr Tramadol to Qid... ~  2014:  She has had numerous visits w/ DrWhitfield/  [place] for injections, XRays, scans etc...  CARPAL TUNNEL SYNDROME, BILATERAL (ICD-354.0) - she had prev right carpal tunnel release by DrWhitfield and was c/o increasing discomfort in her left wrist despite wrist splint Qhs- had left carpal tunnel release 1/10 by DrWhitfield...  ANXIETY (ICD-300.00) - on ALPRAZOLAM 0.5mg  Tid & encouraged to take it regularly... she has a cancer phobia & it is very difficult to reassure her regarding her symptoms and our evaluations... ~  7/13: she went to the ER c/o swelling of the tongue but eval by EDP indicated that her tongue was normal & not swollen, she was not on an ACE, given Pred Rx anyway. ~  She has been asked to incr the ALprazolam to regular dosing but she is reluctant...   Past Surgical History  Procedure Laterality Date  . Left mastectomy    . Carpal tunnel release      Outpatient Encounter Prescriptions as of 05/30/2013  Medication Sig Dispense Refill  . ALPRAZolam (XANAX) 0.5 MG tablet Take 1 tablet (0.5 mg total) by mouth 3 (three) times daily as needed. For anxiety  90 tablet  5  . amLODipine (NORVASC) 2.5 MG tablet TAKE ONE TABLET BY MOUTH ONCE DAILY  30 tablet  6  . aspirin EC 81 MG tablet Take 81 mg by mouth daily.      . carvedilol (COREG) 3.125 MG tablet Take 3.125 mg by mouth 2 (two) times daily with a meal. For blood pressure      . Cholecalciferol (VITAMIN D3) 1000 UNITS CAPS Take 1 capsule by mouth daily. For your bones      . furosemide (LASIX) 40 MG tablet Take 1 tablet (40 mg total) by mouth daily. For swelling  30 tablet  6  . guaiFENesin (MUCINEX) 600 MG 12 hr tablet Take 1,200 mg by mouth daily. For congestion      . Multiple Vitamins-Minerals (MULTIVITAMINS THER. W/MINERALS) TABS Take 1 tablet by mouth daily.      Marland Kitchen  omeprazole (PRILOSEC) 20 MG capsule Take 1 by mouth 30 min before the first meal of the day      . polyethylene glycol (MIRALAX / GLYCOLAX) packet Take 17 g by mouth daily as needed. For constipation      . sennosides-docusate sodium (SENOKOT-S) 8.6-50 MG tablet Take 1 tablet by mouth daily as needed. For constipation      . traMADol (ULTRAM) 50 MG tablet Take 1 tablet (50 mg total) by mouth every 6 (six) hours as needed for pain. As needed for pain  30 tablet  5  . [DISCONTINUED] amLODipine (NORVASC) 2.5 MG tablet Take 2.5 mg by mouth daily.       Marland Kitchen HYDROcodone-acetaminophen (NORCO) 5-325 MG per tablet 1 tablet by mouth three times daily as needed for pain  90 tablet  5   No facility-administered encounter medications on file as of 05/30/2013.    Allergies  Allergen Reactions  . Fentanyl     REACTION: nausea    Current Medications, Allergies, Past Medical History, Past Surgical History, Family History, and Social History were reviewed in [employer] record.    Review of Systems      See HPI - all other systems neg except as noted... She has mult somatic complaints and anxiety. The patient complains of decreased hearing, chest discomfort, dyspnea on exertion, abdominal pain, incontinence, muscle weakness, and difficulty walking.  The patient denies anorexia, fever, vision loss, hoarseness, syncope, peripheral edema, prolonged cough, headaches, hemoptysis, melena, hematochezia, severe  indigestion/heartburn, hematuria, transient blindness, depression, abnormal bleeding, enlarged lymph nodes, and angioedema.     Objective:   Physical Exam   WD, WN, 79 y/o BF in NAD... she is very anxious... GENERAL:  Alert & oriented; pleasant & cooperative... HEENT:  [place]/AT, EOM-wnl, PERRLA, EACs-clear, TMs-wnl, NOSE-clear, THROAT-clear & wnl, no lesions seen. NECK:  Neck ROM decreased, no JVD; normal carotid impulses w/o bruits; no thyromegaly or nodules palpated; no  lymphadenopathy. CHEST:  Clear to P & A; without wheezes/ rales/ or rhonchi heard... + tender right lat 11th & 12th ribs on palp. BREAST:  s/p left mastectomy... right breast normal- without nodules palpated... HEART:  Regular Rhythm; without murmurs/ rubs/ or gallops detected... ABDOMEN:  Soft & nontender; normal bowel sounds; no organomegaly or masses palpated... EXT: without deformities, mod arthritic changes; no varicose veins/ +venous insuffic/ tr edema. NEURO:  CN's intact; no focal neuro deficits... DERM:  Small sl excoriated area of dermatitis left shin- no drainage,no rash, etc...  RADIOLOGY DATA:  Reviewed in the EPIC EMR & discussed w/ the patient...  LABORATORY DATA:  Reviewed in the EPIC EMR & discussed w/ the patient...   Assessment & Plan:    RESPIRATORY SYMPTOMS w/ Dyspnea & throat clearing>  She had a neg pulm eval 2011 as above; she insisted on additional eval 9/13 for her chr throat clearing symptom; CXR w/o acute changes & PFTs normal for her 79 y/o lungs; she is quite insistent on med rx for her problem & we reviewed Mucinex, Hycodan, Dulera, & Listerine (she was intol to the [facility]); pt very anxious & Alprazolam seems to help when she takes it...  HBP>  Controlled on low doses of several meds including Coreg, Norvasc & Lasix;  She is encouraged to take meds regularly...  Hx CP> evaluated for Cards by DrBensimon & he has not been in favor of invasive testing; stable on conservative med rx...  VI/ Edema>  She is concerned about incr edema & we reviewed 2gm sodium diet & rec keep Lasix at 40mg /d...  GI> HH, Gastritis, Divertics, vague abd pain>  I tried once again to reassure her about the thorough GI evaluations that her has undergone; she is happy w/ DrThorne's eval & will f/u w/ her & in the meanwhile continue rx w/ Prilosec40, Miralax, Metamucil, Senakot, Mylicon, etc...  Renal cyst>  This is benign & has been followed by DrKimbrough...  Hx left Breast Cancer>  No  known recurrence...  DJD> Cervical spondylosis & chronic LBP>  She has been evaluated by DrWhitfield for Ortho & given epid steroid shots by DrNewton... She wants a better pain pill & we tried [facility] 5/325 tid as needed but she prefers the Tramadol; knee shots from DrWhitfield helped...  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  Anxiety>  Asked once again to increase the Alprazolam to 1/2 - 1 tab TID regularly...   Patient's Medications  New Prescriptions   No medications on file  Previous Medications   ACETAMINOPHEN (TYLENOL ARTHRITIS PAIN) 650 MG CR TABLET    Take 650 mg by mouth as needed for pain.   AMLODIPINE (NORVASC) 2.5 MG TABLET    TAKE ONE TABLET BY MOUTH ONCE DAILY   ASPIRIN EC 81 MG TABLET    Take 81 mg by mouth daily.   CHOLECALCIFEROL (VITAMIN D3) 1000 UNITS CAPS    Take 1 capsule by mouth daily. For your bones   FUROSEMIDE (LASIX) 40  MG TABLET    Take 1 tablet (40 mg total) by mouth daily. For swelling   GUAIFENESIN (MUCINEX) 600 MG 12 HR TABLET    Take 1,200 mg by mouth daily. For congestion   HYDROCODONE-ACETAMINOPHEN (NORCO) 5-325 MG PER TABLET    1 tablet by mouth three times daily as needed for pain   MULTIPLE VITAMINS-MINERALS (MULTIVITAMINS THER. W/MINERALS) TABS    Take 1 tablet by mouth daily.   OMEPRAZOLE (PRILOSEC) 20 MG CAPSULE    Take 1 by mouth 30 min before the first meal of the day   SENNOSIDES-DOCUSATE SODIUM (SENOKOT-S) 8.6-50 MG TABLET    Take 1 tablet by mouth daily as needed. For constipation   TRAMADOL (ULTRAM) 50 MG TABLET    Take 1 tablet (50 mg total) by mouth every 6 (six) hours as needed for pain. As needed for pain  Modified Medications   Modified Medication Previous Medication   ALPRAZOLAM (XANAX) 0.5 MG TABLET ALPRAZolam (XANAX) 0.5 MG tablet      Take 1/2 to 1 tablet by mouth three times daily as needed    Take 1 tablet (0.5 mg total) by mouth 3 (three) times daily as needed. For anxiety    POLYETHYLENE GLYCOL POWDER (GLYCOLAX) POWDER polyethylene glycol powder (GLYCOLAX) powder      Take 17 g by mouth 2 (two) times daily.    Take 17 g by mouth daily.  Discontinued Medications   AMLODIPINE (NORVASC) 2.5 MG TABLET    Take 2.5 mg by mouth daily.    POLYETHYLENE GLYCOL (MIRALAX / GLYCOLAX) PACKET    Take 17 g by mouth daily as needed. For constipation

## 2013-05-30 NOTE — Patient Instructions (Addendum)
Today we updated your med list in our EPIC system...    Continue your current medications the same...  For the cough & phlegm> continue the Mucinex 600mg  taking 1-2 twice daily 7 lots of fluids...  For the left side pain> try the Abdominal binder & your pain meds (Tramadol & Tylenol)...  Call for any questions...  Let's plan a follow up visit in 81mo, sooner if needed for problems.Marland KitchenMarland Kitchen

## 2013-06-10 ENCOUNTER — Telehealth: Payer: Self-pay | Admitting: Pulmonary Disease

## 2013-06-10 NOTE — Telephone Encounter (Signed)
Spoke with pt in regards to Bra Rx---pt reports she has lost a lot of weight (30lb+) and needs a new Rx to have this renewed.  Pt has already seen [facility] and they know the size and everything she needs. Just need an Rx order for the actual Bra with prosthesis.  Rx needs to be faxed to [facility]"  Fax # [phone]   P # [phone]  ATTN: Misty Stanley They also have requested list of medications to be faxed to them as well for their records  Please advise Dr Kriste Basque. Thanks.

## 2013-06-13 NOTE — Telephone Encounter (Signed)
1.  Pt would like a status update on this, please.  Pt would like to know what the hold up is.  Please advise.  2.  Pt also states that SN told her to purchase a belt & it isn't helping.  Pt states her "gut is swollen on one side".  Would like to know if she should make an appt w/ SN/TP or go to ED.  Heather [name]

## 2013-06-13 NOTE — Telephone Encounter (Signed)
Pt states that she has purchased that "belt" that Dr Kriste Basque recommended--not helping. Pt states that her left side of her "gut" is swollen to the size of a watermelon. Very painful.  Pt wanting to know if she needs to schedule an appt with TP/SN or go to ED.  Pt states that spot on head--knot on back of head is getting larger and beginning to become irritated/tender.   Pt stating that she has been having a lot of mucus build up the past couple days--thick white phlegm. Pt taking Mucinex--not helping.   Please advise if patient needs OV with TP/SN or go to ED---pt requesting one or the other. Thanks.     Leigh, can you please check on the status of this Bra Rx. (Below) Thanks!  Nita Sells, CMA Certified Medical Assistant Signed  Service date: 06/10/2013 12:13 PM   Spoke with pt in regards to Bra Rx---pt reports she has lost a lot of weight (30lb+) and needs a new Rx to have this renewed.  Pt has already seen [facility] and they know the size and everything she needs. Just need an Rx order for the actual Bra with prosthesis.  Rx needs to be faxed to [facility]" Fax # [phone] P # [phone] ATTN: Misty Stanley  They also have requested list of medications to be faxed to them as well for their records  Please advise Dr Kriste Basque. Thanks.

## 2013-06-13 NOTE — Telephone Encounter (Signed)
Per SN---  Pt will need to follow up with her specialist for these issues.  Pt is aware and and she is aware that the rx for the bra [facility]

## 2013-06-16 ENCOUNTER — Telehealth: Payer: Self-pay | Admitting: Pulmonary Disease

## 2013-06-16 DIAGNOSIS — K219 Gastro-esophageal reflux disease without esophagitis: Secondary | ICD-10-CM

## 2013-06-16 DIAGNOSIS — K5730 Diverticulosis of large intestine without perforation or abscess without bleeding: Secondary | ICD-10-CM

## 2013-06-16 NOTE — Telephone Encounter (Signed)
Per SN---  Pt should see Dr. Russella Dar for her abd discomfort.  Called and spoke with pt and she stated that this pain is worse when she gets up and tries to walk.  She stated that she can hold her stomach and this is makes it some better.  Pt is aware that we will set up appt with Dr. Russella Dar to eval her stomach issues further.  Pt also stated that she will go tomorrow to pick up her bra.  Nothing further is needed.

## 2013-06-16 NOTE — Telephone Encounter (Signed)
Pt was seen by SN on 05/30/13 with the following recs:  For the left side pain> try the Abdominal binder & your pain meds (Tramadol & Tylenol)...  -----  Called, spoke with pt.  Reports she did pick up an abd binder and has tried tramadol and tylenol, but none of this is helping.  Reports the binder makes the pain worse.  She the "knot" is bigger and pain is still present.  She is requesting further recs and believes she wants to see a "stomach dr."  SN, pls advise if you have a specific dr you would like pt to see and if you have any further recs in the meantime.  Thank you.  Allergies  Allergen Reactions  . Fentanyl     REACTION: nausea

## 2013-06-17 ENCOUNTER — Telehealth: Payer: Self-pay | Admitting: Gastroenterology

## 2013-06-17 NOTE — Telephone Encounter (Signed)
Patient scheduled to see Willette Cluster RNP for 06/22/13

## 2013-06-17 NOTE — Telephone Encounter (Signed)
I have left a message for Heather [name] in pulmonary

## 2013-06-22 ENCOUNTER — Other Ambulatory Visit: Payer: Self-pay | Admitting: Pulmonary Disease

## 2013-06-22 ENCOUNTER — Encounter: Payer: Self-pay | Admitting: *Deleted

## 2013-06-22 ENCOUNTER — Ambulatory Visit (INDEPENDENT_AMBULATORY_CARE_PROVIDER_SITE_OTHER): Payer: Medicare Other | Admitting: Nurse Practitioner

## 2013-06-22 VITALS — BP 114/58 | HR 88 | Ht 61.5 in | Wt 152.5 lb

## 2013-06-22 DIAGNOSIS — R1032 Left lower quadrant pain: Secondary | ICD-10-CM

## 2013-06-22 DIAGNOSIS — M7918 Myalgia, other site: Secondary | ICD-10-CM

## 2013-06-22 DIAGNOSIS — IMO0001 Reserved for inherently not codable concepts without codable children: Secondary | ICD-10-CM

## 2013-06-22 DIAGNOSIS — K5900 Constipation, unspecified: Secondary | ICD-10-CM

## 2013-06-22 HISTORY — DX: Left lower quadrant pain: R10.32

## 2013-06-22 MED ORDER — POLYETHYLENE GLYCOL 3350 17 GM/SCOOP PO POWD: 17.0000 g | Freq: Two times a day (BID) | ORAL | Status: DC

## 2013-06-22 MED ORDER — POLYETHYLENE GLYCOL 3350 17 GM/SCOOP PO POWD: 17.0000 g | Freq: Every day | ORAL | Status: DC

## 2013-06-22 NOTE — Progress Notes (Signed)
  History of Present Illness:  Patient is a 79 year old female known to Dr. Russella Dar for history of GERD, constipation and diverticulosis. Patient has chronic abdominal complaints. She is here today for evaluation of left lower quadrant pain present for a year or so. Pain noticeable only when walking. Tylenol arthritis doesn't help. She stopped oxycodone because it was constipating and didn't help anyway. Patient feels like something is growing in her lower abdomen and is concerned she may have cancer. No urinary symptoms. She has been wearing an abdominal binder. No bowel changes. Bowels moving fairly well with Miralax and fiber cereal. She has had some involuntary weight loss.  .  She inquires about whether her thyroid was ever checked. Also concerned about excessive phlegm.  Current Medications, Allergies, Past Medical History, Past Surgical History, Family History and Social History were reviewed in [employer] record.  Physical Exam: General: Well developed , black female in no acute distress Head: Normocephalic and atraumatic Eyes:  sclerae anicteric, conjunctiva pink  Ears: Normal auditory acuity Lungs: Clear throughout to auscultation Heart: Regular rate and rhythm Abdomen: Soft, non distended, mild LLQ tenderness. No masses, no hepatomegaly. Normal bowel sounds Musculoskeletal: Symmetrical with no gross deformities  Extremities: 2+ right lower extremity edema, 1+ LLE edema.  Neurological: Alert oriented x 4, grossly nonfocal Psychological:  Alert and cooperative. Normal mood and affect  Assessment and Recommendations:  1.LLQ pain associated with walking. CTscan abd/pelvis/chest in March 2014 didn't show any abdominal /pelvic abnormaltiies. Pain only present with walking. Her abdominal exam is not overly concerning. Suspect musculoskeletal pain. Reassurance provided.   2. Unexplained weight loss of 16 pounds since April of this year. Etiology unclear. Patient  worried about possibility of cancer. CTscan in March 2014 was unrevealing. A PET scan in April of this year revealed extensive multilevel degenerative disc disease of the lumbar spine. No new or suspicious areas of radiotracer uptake are identified in the skeleton. Cause of weight loss unclear but her appetite is okay.   3. Chronic constipation, improved. Continue Miralax and high fiber diet.  4. Remote history of breat cancer, s/p left mastectomy.

## 2013-06-22 NOTE — Patient Instructions (Addendum)
You may increase the Miralax 17 gram dose to twice daily. If you are still constipated after doing that for several days to a week then you can take 1-2 Ducolax tablets once daily.

## 2013-06-23 ENCOUNTER — Encounter: Payer: Self-pay | Admitting: Nurse Practitioner

## 2013-06-23 ENCOUNTER — Telehealth: Payer: Self-pay | Admitting: Pulmonary Disease

## 2013-06-23 DIAGNOSIS — M7918 Myalgia, other site: Secondary | ICD-10-CM | POA: Insufficient documentation

## 2013-06-23 NOTE — Progress Notes (Signed)
Reviewed and agree with management plan.  Karas Pickerill T. Daleysa Kristiansen, MD FACG 

## 2013-06-23 NOTE — Telephone Encounter (Signed)
Per SN---  Ok to use the aleve as needed for pain.   Called and spoke with pt and she is aware of SN recs.

## 2013-06-23 NOTE — Telephone Encounter (Signed)
I spoke with pt. She stated she saw Dr. Russella Dar. She was told to take aleve for her "back inflammation". She stated she remembers SN telling her not to take this bc it can interfer with her other medications. Please advise  SN thanks Last OV 05/30/13 Pending 08/30/13

## 2013-06-29 ENCOUNTER — Telehealth: Payer: Self-pay | Admitting: Pulmonary Disease

## 2013-06-29 NOTE — Telephone Encounter (Signed)
Best choice is clinoril 200 mg every 12 hours with meals #30 for now s refills  and then f/u with Kriste Basque

## 2013-06-29 NOTE — Telephone Encounter (Signed)
Spoke with Raeanne Gathers, PA w Dr. Norlene Campbell [phone]) He states patient was to have an antiinflammatory medication prescribed to her by himself or Dr.Whitfield per Dr. Jodelle Green rec's Judie Grieve states he needs to know more about patient, in regards to her renal function "Which medication is safe for her?" Stated he doesn't want to give her a medication that would react badly for patient and send her into Renal failure Judie Grieve requesting suggestions keeping in mind  patient's current medication list, says patient is already on tramadol Will send to Dr. Sherene Sires as he also does primary care since Dr. Kriste Basque is out of office Dr. Sherene Sires please advise, thank you!  Current Outpatient Prescriptions on File Prior to Visit  Medication Sig Dispense Refill  . acetaminophen (TYLENOL ARTHRITIS PAIN) 650 MG CR tablet Take 650 mg by mouth as needed for pain.      Marland Kitchen ALPRAZolam (XANAX) 0.5 MG tablet Take 1/2 to 1 tablet by mouth three times daily as needed  90 tablet  5  . amLODipine (NORVASC) 2.5 MG tablet TAKE ONE TABLET BY MOUTH ONCE DAILY  30 tablet  6  . aspirin EC 81 MG tablet Take 81 mg by mouth daily.      . Cholecalciferol (VITAMIN D3) 1000 UNITS CAPS Take 1 capsule by mouth daily. For your bones      . furosemide (LASIX) 40 MG tablet Take 1 tablet (40 mg total) by mouth daily. For swelling  30 tablet  6  . guaiFENesin (MUCINEX) 600 MG 12 hr tablet Take 1,200 mg by mouth daily. For congestion      . HYDROcodone-acetaminophen (NORCO) 5-325 MG per tablet 1 tablet by mouth three times daily as needed for pain  90 tablet  5  . Multiple Vitamins-Minerals (MULTIVITAMINS THER. W/MINERALS) TABS Take 1 tablet by mouth daily.      Marland Kitchen omeprazole (PRILOSEC) 20 MG capsule Take 1 by mouth 30 min before the first meal of the day      . polyethylene glycol powder (GLYCOLAX) powder Take 17 g by mouth 2 (two) times daily.  527 g  1  . sennosides-docusate sodium (SENOKOT-S) 8.6-50 MG tablet Take 1 tablet by mouth daily as  needed. For constipation      . traMADol (ULTRAM) 50 MG tablet Take 1 tablet (50 mg total) by mouth every 6 (six) hours as needed for pain. As needed for pain  30 tablet  5   No current facility-administered medications on file prior to visit.

## 2013-06-29 NOTE — Telephone Encounter (Signed)
Spoke with [facility] (Dr. Garald Braver [phone]) Informed of recs as listed below per Dr. Miles Costain verbalized ok and that he would give msg to [facility] Nothing further needed at this time

## 2013-07-12 ENCOUNTER — Encounter: Payer: Self-pay | Admitting: Nurse Practitioner

## 2013-07-12 ENCOUNTER — Telehealth: Payer: Self-pay | Admitting: Gastroenterology

## 2013-07-12 ENCOUNTER — Encounter: Payer: Self-pay | Admitting: Gastroenterology

## 2013-07-12 ENCOUNTER — Ambulatory Visit (INDEPENDENT_AMBULATORY_CARE_PROVIDER_SITE_OTHER): Payer: Medicare Other | Admitting: Nurse Practitioner

## 2013-07-12 VITALS — BP 140/70 | HR 72 | Ht 62.0 in | Wt 150.0 lb

## 2013-07-12 DIAGNOSIS — M7918 Myalgia, other site: Secondary | ICD-10-CM

## 2013-07-12 DIAGNOSIS — IMO0001 Reserved for inherently not codable concepts without codable children: Secondary | ICD-10-CM

## 2013-07-12 NOTE — Telephone Encounter (Signed)
Patient having continued abdominal pain.  She can't stand pants of her bra to touch her stomach.  She will come in and see Heather [name] RNP today at 1:30

## 2013-07-12 NOTE — Patient Instructions (Addendum)
Follow up as needed

## 2013-07-13 ENCOUNTER — Encounter: Payer: Self-pay | Admitting: Nurse Practitioner

## 2013-07-13 NOTE — Progress Notes (Signed)
Reviewed and agree with management plan. No further GI evaluation or follow up is recommended. She should follow up with her PCP for ongoing care.  [name], MD [facility]

## 2013-07-13 NOTE — Progress Notes (Signed)
  History of Present Illness:  Patient is a 79 year old female known to Dr. Russella Dar for history of GERD, constipation and diverticulosis. I saw her earlier this month for evaluation of left lower quadrant pain present for a year or so. Pain was noticeable only when walking. Tylenol arthritis did help. She had stopped oxycodone because it was constipating and didn't help anyway. Pain was felt not to be of GI origin but rather musculoskeletal.  Anti-inflammatories were discussed but patient wanted to discuss NSAID use with PCP. She recently started twice daily Clinoril which has helped the LLQ pain. She still describes feeling that something abnormal is in her left abdomen and feels the need to support left pannus when she walks. Her bowels are moving okay. She is still concerned about the possibility of having cancer somewhere.  Current Medications, Allergies, Past Medical History, Past Surgical History, Family History and Social History were reviewed in [employer] record.  Physical Exam: General: Well developed , black female in no acute distress Head: Normocephalic and atraumatic Eyes:  sclerae anicteric, conjunctiva pink  Ears: Normal auditory acuity Lungs: Clear throughout to auscultation Heart: Regular rate and rhythm Abdomen: Soft, non distended, non-tender. No masses, no hepatomegaly. Normal bowel sounds Musculoskeletal: Symmetrical with no gross deformities . Walks with a cane Extremities: Trace bilateral lower extremity edema   Neurological: Alert oriented x 4, grossly nonfocal Psychological:  Alert and cooperative. Normal mood and affect  Assessment and Recommendations: left mid to left lower quadrant pain with walking and when in certain positions. NSAIDs have helped to some degree. Patient still feels like something is bulging in her left abdomen. Cannot really appreciate anything on exam and CT scan in March for abdominal pain was unrevealing. Within the last  couple of years patient has been evaluated by [facility] as well as gastro[facility]olist at [facility]. Suspect her pain is musculoskeletal in nature or possibly neurologic as she describe sensation of water running down the side of her legs.  Long discussion with patient regarding her left abdominal pain. We reviewed previous test results. Reassured patient that this doesn't seem to be a GI problem and that no cancers (including recurrent breast cancer) have been found. Face to face time greater than 30 minutes

## 2013-07-15 ENCOUNTER — Telehealth: Payer: Self-pay | Admitting: Pulmonary Disease

## 2013-07-15 NOTE — Telephone Encounter (Signed)
Per SN---  Exactly right No salt in her diet Stay off her feet with legs elevated this weekend Cont the lasix 40 mg   1/2 to 1 tablet by mouth each morning.

## 2013-07-15 NOTE — Telephone Encounter (Signed)
I spoke with pt and she stated she woke up with her right leg, swollen, warm to touch, just feels tight. She stated she does not feel like it is seeping but it does on the "inside" of his skin. Pt stated she is keeping the leg elevated and has been bc it swells all the time but today is worse. Pt took furosemide 40 mg today. Pt is requesting further recs. Please advise SN thanks  Allergies  Allergen Reactions  . Fentanyl     REACTION: nausea

## 2013-07-15 NOTE — Telephone Encounter (Signed)
Pt called back and she is aware of SN recs for her.   Pt is aware that she can cont to take the tramadol as needed for pain.  Pt voiced her understanding and nothing further is needed.

## 2013-07-15 NOTE — Telephone Encounter (Signed)
ATC pt line busy x 3 wcb 

## 2013-07-20 ENCOUNTER — Encounter: Payer: Self-pay | Admitting: Pulmonary Disease

## 2013-07-20 ENCOUNTER — Telehealth: Payer: Self-pay | Admitting: Pulmonary Disease

## 2013-07-20 ENCOUNTER — Ambulatory Visit (INDEPENDENT_AMBULATORY_CARE_PROVIDER_SITE_OTHER): Payer: Medicare Other | Admitting: Pulmonary Disease

## 2013-07-20 VITALS — BP 120/62 | HR 70 | Temp 98.2°F | Ht 62.0 in | Wt 151.6 lb

## 2013-07-20 DIAGNOSIS — K5730 Diverticulosis of large intestine without perforation or abscess without bleeding: Secondary | ICD-10-CM

## 2013-07-20 DIAGNOSIS — M545 Low back pain, unspecified: Secondary | ICD-10-CM

## 2013-07-20 DIAGNOSIS — Z853 Personal history of malignant neoplasm of breast: Secondary | ICD-10-CM

## 2013-07-20 DIAGNOSIS — R609 Edema, unspecified: Secondary | ICD-10-CM

## 2013-07-20 DIAGNOSIS — I519 Heart disease, unspecified: Secondary | ICD-10-CM

## 2013-07-20 DIAGNOSIS — M1990 Unspecified osteoarthritis, unspecified site: Secondary | ICD-10-CM

## 2013-07-20 DIAGNOSIS — F411 Generalized anxiety disorder: Secondary | ICD-10-CM

## 2013-07-20 DIAGNOSIS — K5900 Constipation, unspecified: Secondary | ICD-10-CM

## 2013-07-20 DIAGNOSIS — F450 Somatization disorder: Secondary | ICD-10-CM

## 2013-07-20 DIAGNOSIS — I10 Essential (primary) hypertension: Secondary | ICD-10-CM

## 2013-07-20 DIAGNOSIS — I872 Venous insufficiency (chronic) (peripheral): Secondary | ICD-10-CM

## 2013-07-20 DIAGNOSIS — K219 Gastro-esophageal reflux disease without esophagitis: Secondary | ICD-10-CM

## 2013-07-20 NOTE — Telephone Encounter (Signed)
I spoke with pt. She stated Dr. Cleophas Dunker advised her she needs to see her PCP regarding her leg swelling. Pt stated it is not getting any better and they hurt. SN had an opening at 3:30 today and pt is scheduled to come in at that time. Nothing further needed

## 2013-07-20 NOTE — Patient Instructions (Addendum)
Today we updated your med list in our EPIC system...    Continue your current medications the same...    We refilled your meds per request...  For your leg swelling>>     NO SALT or sodium in your diet...    Keep your legs elevated...    Wear support hose...    Continue your fluid pill= LASIX (Furosemide) 40mg  one tab every morning...  Call for any questions.Marland KitchenMarland Kitchen

## 2013-07-20 NOTE — Progress Notes (Signed)
Subjective:    Patient ID: Heather [name], female    DOB: 11-04-1918, 79 y.o.   MRN: [id number]  HPI 79 y/o BF here for a follow up visit... she has multiple medical problems including:  Dyspnea & Anxiety;  HBP;  Hx CP followed by DrBensimhon;  HH/ Gastritis;  Divertics/ Constip;  Functional Abd Pain;  Renal cyst followed by DrKimbrough;  Hx left breast [phone];  DJD/ CSpine spondy/ LBP/ CTS; and she has a cancer phobia...  ~  December 29, 2011:  4mo ROV & Heather [name] has her usual complement of somatic complaints> all seem stable w/o appreciable change; she does not want to see DrJEdwards again, but is happy w/ eval & communication from DrThorne at [facility] says her abd pain is coming from the arthritis in her back, & she has seen DrWhitfield for that... She lists mult OTC GI meds including> Prilosec, Miralax, Metamucil, Senakot, Mylicon...    BP controlled on her Coreg, Amlodipine, Lasix; BP= 120/68 today & denies CP/ palpit, ch in DOE, edema, etc...    She saw DrThorne recently for f/u & note pending; prev note 11/12 reviewed> she has done a thorough investigation...    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  ~  Mar 29, 2012:  4mo ROV & she continues w/ mult complaints- ran out of Dymista sample for nasal symptoms (didn't help), thinks her BP is too low, c/o swelling (has VI- reminded to elim sodium, wear support hose, elevate, take Lasix40), "constip" but stool is loose & soft (rec- Align), saw DrEdwards again for GI- she denies dysphagia, still c/o chr abd discomfort, etc; c/o "dark urine" it was clear and C&S was neg; she requires constant reassurance about her condition & that she does not have cancer; asked to take her Xanax more regularly...  We reviewed prob list, meds, xrays and labs>  See below>>   ~  June 28, 2012:  4mo ROV & Heather [name] continues to have mult  somatic complaints & says "terrible, terrible, terrible" states she's lost all the muscles in her abd & now uses a girdle; "all my doctors say for me to see you" eg> DrEdwards, DrNishan, etc; she still believes that she has cancer "somewhere- just like my friend"... Then she launches into a flight of ideas of complaints> carpel tunnel symptoms (she wants to f/u w/ DrWhtfield), cough/ phlegm, dizzy, "it's my stomach", etc...    We reviewed prob list, meds, xrays and labs> see below for updates >>  ~  August 03, 2012:  32mo ROV & add-on at pt's insistance for "hocking- it's not a cough, it's a hock"; then she proceeds to clear her throat forcefully & "hock-up" some clear phlegm; she notes "thick white foamy slimy sputum w/ flecks of chopped meat in it"; she denies cough/ hemoptysis/ fcs, etc; she notes some mild dyspnea (at baseline) and occas CP from the force of the hocking; she is quite insistent on additional work-up & Rx for this perceived problem:     Exam is clear- no oral lesions, neck is neg, chest is clear...    CXR shows sl overinflation, clear lungs, normal heart size w/ elongation & tortuous Ao...    PFTs are wnl> FVC=2.62 (128%), FEV1=2.20 (158%), %1sec=84; mid-flows=274% of predicted...    Note> she is quite anxious & has a cancer phobia; again asked to  take her Alprazolam Tid regularly... We reviewed prob list, meds, xrays and labs> see below for update >> We decided on a trial regimen to help her "hocking" w/ short term f/u to assess efficacy> MUCINEX 600mg - 2Bid w/ lots of fluids; HYCODAN syrup- 1tspQid; DULERA100/5- 1puffBid then rinse w/ Listerine... Note> before completing her eval today she insisted in a right breast exam stating that DrBertrand insisted that her PCP examine her breast at every visit> it was neg.  ~  August 30, 2012:  57mo ROV & recheck> "I'm just falling apart, meds not helping, Dulera choked me up, mouth glued together" therefore she stopped the [facility] & does  better on no Rx she says "there is something wrong w/ me, I'm losing so much weight, I'm skin & bones" wt today= 156# which is up 8# from last visit; she is c/o snorting phlegm that she says tastes bitter & has little white balls in it (she could not produce any sput despite a lot of hocking in the office today (advised to continue Mucinex600mg -2Bid w/ fluids)......  Also c/o left lat CWP, tender ribs & wears lumbar brace/ rib binder, +takes Hydrocodone/ Tramadol as needed...     We reviewed prob list, meds, xrays and labs> see below for updates >> she declines the 2013 flu vaccine... LABS 10/13:  Chems-wnl;  CBC- wnl;  TSH= 1.79  ~ September 27, 2012: 57mo ROV & Heather [name] is rambling on w/ 'flight of ideas" today & due to this we tried a systems approach & touched on all these problems during her visit today>>     Resp symptoms> c/o SOB, hocking phlegm, "this throat business" for which she has seen DrRosen, ENT; now c/o decr hearing, thick phlegm, etc; We reviewed CXR 10/13- clear & wnl; PFT 10/13- normal airflow; Rec- Mucinex600mg -2Bid, lots of fluids, & f/u w/ ENT for hearing eval...     HBP> on Coreg3.125Bid, Amlod2.5, Lisin40; BP= 120/64 & we reviewed labs from 10/13- all wnl...     HxCP> on ASA81; followed by DrBensimhon/ Walker Kehr for Cards & their notes are reviewed=> neg ischemic work up...     VI, Edema> she went to the ER 11/13 w/ edema; VDopplers were neg for DVT; they reiterated the recs for no salt, elevation, support hose, & continue Lasix40.     GI- HH, Gastirits, Divertics, chronic pain & "swelling"> on Omep20- taking 2/d & we discussed derc to one daily; plus Miralax & Senakot-S...     Renal cyst> she is very anxious about everything & has a cancer phobia; DrKimbrough followed this benign simple cyst for yrs- no change...     Hx Breast cancer> she had left mod radical mastectomy 1994 by DrBlievernicht- no known recurrence & she is given reassurance...     Ortho- DJD, Cspine spongy, LBP,  CTS> followed by DrWhitfield; on Vicodin & Tramadol but prefers the latter she says...     Anxiety> on Alpraz0.5mg Tid but "I'm not taking it very often" she says & encouraged to use more regularly...  We reviewed prob list, meds, xrays and labs> see below for updates >>   ~  October 27, 2012:  57mo ROV & Heather [name] has mult somatic complaints as usual- dry mouth at night, hocking thick phlegm, c/o stomach/ back/ etc... Again we reviewed the following problems during today's visit>>    Resp symptoms> on Mucinex2Bid +Fluids; she saw DrRosen, ENT 11/13- dysphagia, presbycusis, referral otalgia; exam was normal, barium swallow 11/13 showed diminished primary peristaltic wave, mild tertiary contractions,  prominent cricopharyngeus muscle, no hiatal hernia/ no reflux/ barium pill passed into the stomach without delay...    HBP> on Coreg3.125Bid, Amlod2.5, Lisin40; BP= 120/60 & she persists w/ mult somatic complaints...    HxCP> on ASA81; followed by DrBensimhon/ DrNishan for Cards & their notes are reviewed=> neg ischemic work up previously...     VI, Edema> she went to the ER 11/13 w/ edema; VDopplers were neg for DVT; they reiterated the recs for no salt, elevation, support hose, & continue Lasix40.     GI- HH, Gastirits, Divertics, chronic pain & "swelling"> on Omep20mg /d plus Miralax & Senakot-S; she states "my stomach has gone to my back".    Renal cyst> she is very anxious about everything & has a cancer phobia; DrKimbrough followed this benign simple cyst for yrs- no change...     Hx Breast cancer> she had left mod radical mastectomy 1994 by DrBlievernicht- no known recurrence & she is given reassurance...     Ortho- DJD, Cspine spondy, LBP, CTS> followed by DrWhitfield & he injected her right knee 1wk ago; on Vicodin & Tramadol but prefers the latter she says...     Anxiety> on Alpraz0.5mg Tid but "I'm not taking it very often" she says & encouraged to use more regularly...  We reviewed prob list, meds,  xrays and labs> see below for updates >>   ~  December 13, 2012:  6wk ROV & Heather [name] had a good holiday but launches into her usual litany of complaints> feels bad in the mornings, legs sore "they get hot", "I get hot in my back", c/o "hocking up little white balls of phlegm", stools are dark & small, etc... She thinks she needs massage for her back problems & has seen DrWhitfield w/ PT recommended... She is rec to increase Mucinex 600mg -2Bid w/ Fluids;  Not currently c/o dyspnea;  BP well controlled on meds;  She has chronic GI complaints managed by DrStark & she had no specific GI complaints today;  She has Vicodin & Tramadol for pain;  She has Therapist, sports for anxiety & rec (again) to take it more regularly...     We reviewed prob list, meds, xrays and labs> see below for updates >>   ~  January 07, 2013:  3-4wk ROV & add-on for multiple somatic complaints> MsHarris insisted on this add-on visit due to mult somatic complaints & when asked what specifically she was complaining about she launched into a flight of ideas all over the place> stiff; worse & worse; phlegm w/ white flecks & she cannot recalll our mult prev discussions about this w/ rec for Mucinex-2Bid & lots of fluids; legs jerking (rec for tonic water at bedtime); "my back is on fire" esp at night (rec to try Gabapentin 100mg  1-2 at bedtime); wt loss w/ fair appetite (rec to drink Ensure 2 cans/d betw meals); wants to try massage therapy (I told her ok w/ me)...  ~  March 14, 2013:  73mo ROV & Heather [name] persists w/ mult somatic complaints and seems almost inconsolable regarding her health; recall that she has a cancer phobia & she has been impossible to reassure in this regard no matter how many tests we run or specialists we send her to... Today she is concerned about "losing weight right & left" but our scale says she has gained 2# to 147# & her BMI= 28-29;  She continues to c/o harking phlegm & congestion ("thick lumpy slimy balls of white meat"),  states her urine is very yellow in  the evening, and her left arm & right leg are swelling... She is also c/o back pain & she has seen DrWhitfield, Berneice Gandy 4 times since I saw her last- XRays w/ extensive multilevel DDD in spine, Bone Scan w/o boney mets, "I'm getting therapy"... We discussed checking Sput C&S (NTF only), and increasing her Mucinex600mg  to 2Bid + fluids; her recent UA & cult were neg as well...    BP remains well controlled on her ;  GI is stable on Prilosec & laxatives, followed by DrJEdwards;  She has Xanax for anxiety but she still won't take it regularly as I havew recommended to her...     We reviewed prob list, meds, xrays and labs> see below for updates >>   ~  May 30, 2013:  2-79mo ROV & Heather [name] is soon to be 79 y/o> stable without new complaints but says "terrible, terrible, terrible" & still mentions the perceived "swelling" in her left side of abd ("it's so big i have to hold it up" & asked to try an abd binder) and again mentions a mild cough productive of "little white balls of mucous"; she states "DrCrossley can't find where the phlegm is coming from, c/o "hocking little white balls that makes me sick";  She is on pain meds from [facility]- off Vicodin on Tramadol...     We reviewed prob list, meds, xrays and labs> see below for updates >>  She is rec to continue f/u w/ her specialists to get the reassurance she needs to know that everything possible is being done to address her concerns... Asked to take the Alpraz0.5mg Tid regularly (she never does)  ~  July 20, 2013:  6wk ROV & nothing has changed for Heather [name]- still c/o pain, swelling, etc... She has had follow ups w/GI & Ortho- notes reviewed; she remains very anxious w/ mult somatic complaints and as prev noted has a cancer phobia but is totally impossible to console, reassure, etc... Wonders why she is losing so much weight (weight is up 4# to 152# today) & we reviewed no salt, elevation, support hose, & the Lasix...      We reviewed the following medical problems during today's office visit >> she refuses the Flu vaccine... She is asked to ret for Fasting blood work...           Problem List:  RESPIRATORY SYMPTOMS >> c/o "hocking phlegm" DYSPNEA (ICD-786.9) - eval 5/11 c/o SOB- can't get a DB, can't get enough air in, etc... occurs at rest & w/ exerc "I'm very active despite my arthritis", talks rapidly in long sentences w/o apparent distress... no cough, phlegm, hemoptysis, etc- but as usual she is quite distressed by these symptoms and wants further eval> we discussed re-assessment w/ CXR (clear, NAD);  PFT (totally norm airflow);  Labs (all WNL);  Rx w/ ALPRAZOLAM 0.5mg  Tid & encouraged to take regularly. ~  CXR 4/13 showed norm heart size, clear lungs, mild TSpine spondylosis... ~  She continues to complain of drainage, throat symptoms, etc; Rx w/ Mucinex, OTC antihist, Nasonex, etc;  Seen by ENT, DrRosen w/ ?xerostomia & rec incr water etc. ~  9/13: Add-on visit for "hocking, it's not a cough, it's a hock"> see above... ~  9/13: CXR shows sl overinflation, clear lungs, normal heart size w/ elongation & tortuous Ao;  PFTs are wnl> FVC=2.62 (128%), FEV1=2.20 (158%), %1sec=84; mid-flows=274% of predicted... We discussed trial rx for her symptoms- Mucinex, Fluids, Hycodan, Dulera100- 1puffbid. ~  10/13: she reports INTOL to [facility]  w/ throat & mouth symptoms "like glue" therefore stopped this med; advised to continue the others. ~  4/14: she is asked to increase the Mucinex600mg  to 2Bid + plenty of water daily...  HYPERTENSION (ICD-401.9) - on AMLODIPINE 2.5mg /d,  COREG 3.125mg Bid,  LASIX 40mg - taking 1/2tab/d because 1 tab makes her too weak...  ~  7/12:  C/o incr edema in legs & advised to elim sodium from diet & incr her LASIX to 40mg /d... ~  10/12:  Prev Cozaar 50mg  was stopped 10/12 via the ER when she presented w/ ?angioedema of the tongue... ~  2/13:  BP= 120/68 & doing well on 3 med regimen & off  Losartan. ~  5/13:  BP= 110/60 & she has mult somatic complaints but denies HA, CP, palpit, ch in SOB or edema... ~  8/13:  BP= 112/58 & there is no change in her mult somatic complaints... ~  9/13:  BP= 100/60 on Coreg3.125Bid & Amlod2.5 & prn Lasix... ~  10/13:  BP= 120/58 & she has mult somatic complaints as before... ~ 11/13: on Coreg3.125Bid, Amlod2.5, Lisin40; BP= 120/64 & we reviewed labs from 10/13- all wnl...  ~  12/13: on Coreg3.125Bid, Amlod2.5, Lisin40; BP= 120/60 & she persists w/ mult somatic complaints... ~  4/14: on Coreg3.125Bid, Amlod2.5, Lisin40; BP= 124/60 & she persists w/ mult somatic complaints. ~  7/14:  On same , BP=112/58, & she is reassured...  Hx of CHEST PAIN (ICD-786.50) - she takes ASA 81mg /d + Tylenol Prn... followed by DrBensimhon for Cards. ~  Baseline EKG w/ RBBB & LAD, no acute changes... ~  NuclearStressTest done 2007 & 2009 showed normal- without scar or ischemia, EF=67%...   ~  2DEcho 7/08 showed mild AI, mild diastolic dysfunction, EF=55-60%. ~  CT Abd 1/09 & 9/12 showed incidental coronary calcif, & aorto-iliac atherosclerotic changes as well... ~  8/13: she saw DrNishan> HBP, DD, atypCP w/ neg Myoviews & no changes made... ~  10/13:  She uses a back brace/ abd binder to help her CWP...  VENOUS INSUFFICIENCY (ICD-459.81) - she has mod VI and follows a low sodium diet, elevates legs when able, and wears support hose. ~  7/12:  C/o incr edema & we reviewed a 2gm sodium diet & increased her Lasix from 20mg /d to 40mg /d... ~  5/13:  She has persist complaints but mild chr VI w/ min edema & reminded to elim sodium, elev, support hose, take the Lasix... ~ 11/13: she went to the ER w/ edema; VDopplers were neg for DVT; they reiterated the recs for no salt, elevation, support hose, & continue Lasix40.  HIATAL HERNIA (ICD-553.3) & GASTRITIS (ICD-535.50) - on OMEPRAZOLE 20mg /d & followed by DrJEdwards for GI having fired DrStark & [facility] in past. ~  last EGD  was 2/08 w/ 3cm HH & gastitis... ~  CT Abd 1/09 revealed calcif gallstones, & mild ectasia of AA... ~  CT Abd 1/11 showed left colon divertics, tiny hepatic lesions w/o change & right renal cyst stable. ~  CT Abd 9/12 showed divertics (no inflamm), right renal & left hep lobe cysts, atherosclerotic changes, NAD... ~  Seen by DrEdwards 4/13> c/o dysphagia & congestion; he notes mult GI complaints, known esoph dysmotility w/o stricture, hard to pin down her complaints... ~  She has followed up w/ DrStark regarding her concerns about swelling in her abd...  DIVERTICULOSIS, COLON (ICD-562.10) - followed regularly by DrStark. ~  colonoscopy 7/03 by DrStark showing divertics only... we discussed Miralax + Senakot-S for constipation. ~  colonoscopy 2/11 showed divertics, 3mm polyp, hems...  ? of ABDOMINAL PAIN & SYMPTOMATOLOGY - she had a very thorough work up in 2009> she has a cancer phobia and is very difficult to reassure her that everything appropriate has been done & no cancer found... Mult additional GI evals since then from [facility], [facility] (DrEdwards), [facility] (DrThorne)... ~  eval by GI- DrStark 12/08 but pt wasn't satisfied w/ his examination... ~  full labs 1/09 were WNL, sed=20... ~  CT Abd/ Pelvis 1/09= no acute abn in abd (calcif gallstones & mild ectasia of AA), calcif fibroid in the pelvis, & ?regarding the left ovary. ~  referral to DrFontaine, GYN> his notes are reviewed... ~  follow up w/ DrKimbrough, Urology> his notes are reviewed... ~  eval DrWhitfield for Orthopedics- rec shots in back by [facility] w/ some improvement. ~  repeat GI eval 1/11 by DrStark w/ CT & colonoscpy as above- no acute problems. ~  5/11 & 7/11 >> persist complaints w/ neg recheck by DrStark & DrKimbrough... ~  12/11:  routine GYN surveillance DrFontaine found anteverted uterus w/ fibroids, atrophic ovaries bilat, no mass. ~  Repeat GI eval 4/12 by DrStark> he tried to reassure the pt, felt some symptoms  might be from poor abd wall muscle tone, rec continue Omep, laxatives... ~  6/12:  She sought another GI opinion DrEdwards> he did not feel she needed any additional testing... ~  She tells me she has yet another appt to see a gastroenterologist at [facility]==> seen by DrThorne 8/12 & note reviewed... ~  EGD by DrThorne 9/12 w/ mult whitish nodular plaques in cardia & fundus> Bx results pending & she is very concerned (remember her hx cancer phobia). ~  She continues to f/u w/ DrThorne at [facility] & is happy w/ her eval... ~  Now back w/ DrStark at [facility]...  RENAL CYST (ICD-593.2) - she has a 1 cm right renal cyst seen on CT in 2007 and followed by DrKimbrough... f/u CT Abd 1/09 - no change in the cyst... f/u renal ultrasound 1/10 w/ small bilat cysts... f/u CT Abd 1/11- no change in cyst. ~  she saw DrKimbrough 7/11 for LBP, microhematuria, urge incontinence- stable, no additional Rx. ~  she saw DrKimbrough 1/12 for urge incontinence, freq, nocturia> rec Desmopressin 0.2mg - 1/2 tab Qhs.  CARCINOMA, BREAST, LEFT (ICD-174.9) - Surgery 9/94 by DrBlievernicht w/ Left modified radical mastectomy... +estrogen receptors and Rx w/ Tamoxifen... f/u mammograms all neg... breast exam on right has been neg- no nodules palpated... She says that DrBertrand insisted that her PCP check her right breast at each & every office visit to be sure she doesn't develop any nodules...  DEGENERATIVE JOINT DISEASE (ICD-715.90) - on OTC meds + VICODIN Prn... she has end-stage OA of Right Knee per DrWhitfield treated w/ shots in the past... she has a knee brace and a cane for ambulation... ~  8/13:  She wants a better pain pill & we discussed change to [facility] 5/325 Tid prn... ~  12/13: she tells me that DrWhitfield injected her knee last wk- improved...  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 Rx w/ rest, heat, Percocet, and f/u w/ Ortho...  Hx of  LOW BACK PAIN, CHRONIC (ICD-724.2) - eval DrWhitfield w/ rec for shots by DrNewton & she's feeling sl better & wears back brace... ~  5/10: mult somatic complaints- primarily c/o left side/ postero-lat rib  pain...  Exam w/ tender left lat/ post ribs... Bone Scan= neg... ~  MRI Lumbar spine 7/10 by DrWhitfield... ~  Epidural steroid shot by DrNewton 8/10 & 1/11 ~  Repeat LBP eval by DrWhitfield w/ another MRI lumbar spine showing degenerative changes w/ mild progression since 7/10 MRI, mild-mod sp stenosis & lat recess stenosis at several levels ~  DrWhitfield referred her to [facility] to consider more shots but she is not so inclined... ~  6/13: she saw DrTooke w/ lumbar spondylosis; he felt that he had nothing to offer her & rec incr Tramadol to Qid... ~  2014:  She has had numerous visits w/ DrWhitfield/ [facility] for injections, XRays, scans etc...  CARPAL TUNNEL SYNDROME, BILATERAL (ICD-354.0) - she had prev right carpal tunnel release by DrWhitfield and was c/o increasing discomfort in her left wrist despite wrist splint Qhs- had left carpal tunnel release 1/10 by DrWhitfield...  ANXIETY (ICD-300.00) - on ALPRAZOLAM 0.5mg  Tid & encouraged to take it regularly... she has a cancer phobia & it is very difficult to reassure her regarding her symptoms and our evaluations... ~  7/13: she went to the ER c/o swelling of the tongue but eval by EDP indicated that her tongue was normal & not swollen, she was not on an ACE, given Pred Rx anyway. ~  She has been asked to incr the ALprazolam to regular dosing but she is reluctant...   Past Surgical History  Procedure Laterality Date  . Left mastectomy    . Carpal tunnel release      Outpatient Encounter Prescriptions as of 07/20/2013  Medication Sig Dispense Refill  . ALPRAZolam (XANAX) 0.5 MG tablet Take 1/2 to 1 tablet by mouth three times daily as needed  90 tablet  5  . amLODipine (NORVASC) 2.5 MG tablet TAKE ONE TABLET BY MOUTH ONCE DAILY  30 tablet   6  . aspirin EC 81 MG tablet Take 81 mg by mouth daily.      . Cholecalciferol (VITAMIN D3) 1000 UNITS CAPS Take 1 capsule by mouth daily. For your bones      . furosemide (LASIX) 40 MG tablet Take 1 tablet (40 mg total) by mouth daily. For swelling  30 tablet  6  . guaiFENesin (MUCINEX) 600 MG 12 hr tablet Take 1,200 mg by mouth daily. For congestion      . Multiple Vitamins-Minerals (MULTIVITAMINS THER. W/MINERALS) TABS Take 1 tablet by mouth daily.      Marland Kitchen omeprazole (PRILOSEC) 20 MG capsule Take 1 by mouth 30 min before the first meal of the day      . polyethylene glycol powder (GLYCOLAX) powder Take 17 g by mouth 2 (two) times daily.  527 g  1  . sennosides-docusate sodium (SENOKOT-S) 8.6-50 MG tablet Take 1 tablet by mouth daily as needed. For constipation      . sulindac (CLINORIL) 200 MG tablet Take 200 mg by mouth 2 (two) times daily.      . traMADol (ULTRAM) 50 MG tablet Take 1 tablet (50 mg total) by mouth every 6 (six) hours as needed for pain. As needed for pain  30 tablet  5  . [DISCONTINUED] acetaminophen (TYLENOL ARTHRITIS PAIN) 650 MG CR tablet Take 650 mg by mouth as needed for pain.       No facility-administered encounter medications on file as of 07/20/2013.    Allergies  Allergen Reactions  . Fentanyl     REACTION: nausea    Current Medications, Allergies, Past Medical  History, Past Surgical History, Family History, and Social History were reviewed in [employer] record.    Review of Systems      See HPI - all other systems neg except as noted... She has mult somatic complaints and anxiety. The patient complains of decreased hearing, chest discomfort, dyspnea on exertion, abdominal pain, incontinence, muscle weakness, and difficulty walking.  The patient denies anorexia, fever, vision loss, hoarseness, syncope, peripheral edema, prolonged cough, headaches, hemoptysis, melena, hematochezia, severe indigestion/heartburn, hematuria, transient  blindness, depression, abnormal bleeding, enlarged lymph nodes, and angioedema.     Objective:   Physical Exam   WD, WN, 79 y/o BF in NAD... she is very anxious... GENERAL:  Alert & oriented; pleasant & cooperative... HEENT:  [place]/AT, EOM-wnl, PERRLA, EACs-clear, TMs-wnl, NOSE-clear, THROAT-clear & wnl, no lesions seen. NECK:  Neck ROM decreased, no JVD; normal carotid impulses w/o bruits; no thyromegaly or nodules palpated; no lymphadenopathy. CHEST:  Clear to P & A; without wheezes/ rales/ or rhonchi heard... + tender right lat 11th & 12th ribs on palp. BREAST:  s/p left mastectomy... right breast normal- without nodules palpated... HEART:  Regular Rhythm; without murmurs/ rubs/ or gallops detected... ABDOMEN:  Soft & nontender; normal bowel sounds; no organomegaly or masses palpated... EXT: without deformities, mod arthritic changes; no varicose veins/ +venous insuffic/ tr edema. NEURO:  CN's intact; no focal neuro deficits... DERM:  Small sl excoriated area of dermatitis left shin- no drainage,no rash, etc...  RADIOLOGY DATA:  Reviewed in the EPIC EMR & discussed w/ the patient...  LABORATORY DATA:  Reviewed in the EPIC EMR & discussed w/ the patient...   Assessment & Plan:    RESPIRATORY SYMPTOMS w/ Dyspnea & throat clearing>  She had a neg pulm eval 2011 as above; she insisted on additional eval 9/13 for her chr throat clearing symptom; CXR w/o acute changes & PFTs normal for her 79 y/o lungs; she is quite insistent on med rx for her problem & we reviewed Mucinex, Hycodan, Dulera, & Listerine (she was intol to the [facility]); pt very anxious & Alprazolam seems to help when she takes it...  HBP>  Controlled on low doses of several meds including Coreg, Norvasc & Lasix;  She is encouraged to take meds regularly...  Hx CP> evaluated for Cards by DrBensimon & he has not been in favor of invasive testing; stable on conservative med rx...  VI/ Edema>  She is concerned about incr edema & we  reviewed 2gm sodium diet & rec keep Lasix at 40mg /d...  GI> HH, Gastritis, Divertics, vague abd pain>  I tried once again to reassure her about the thorough GI evaluations that her has undergone; she is happy w/ DrThorne's eval & will f/u w/ her & in the meanwhile continue rx w/ Prilosec40, Miralax, Metamucil, Senakot, Mylicon, etc...  Renal cyst>  This is benign & has been followed by DrKimbrough...  Hx left Breast Cancer>  No known recurrence...  DJD> Cervical spondylosis & chronic LBP>  She has been evaluated by DrWhitfield for Ortho & given epid steroid shots by DrNewton... She wants a better pain pill & we tried [facility] 5/325 tid as needed but she prefers the Tramadol; knee shots from DrWhitfield helped...  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  Anxiety>  Asked once again to increase the Alprazolam to 1/2 - 1 tab TID regularly...   Patient's Medications  New Prescriptions   No medications on file  Previous Medications   ALPRAZOLAM (XANAX) 0.5 MG TABLET    Take 1/2 to 1 tablet by mouth three times daily as needed   AMLODIPINE (NORVASC) 2.5 MG TABLET    TAKE ONE TABLET BY MOUTH ONCE DAILY   ASPIRIN EC 81 MG TABLET    Take 81 mg by mouth daily.   CARVEDILOL (COREG) 3.125 MG TABLET    Take 3.125 mg by mouth 2 (two) times daily with a meal.   CHOLECALCIFEROL (VITAMIN D3) 1000 UNITS CAPS    Take 1 capsule by mouth daily. For your bones   FUROSEMIDE (LASIX) 40 MG TABLET    Take 1 tablet (40 mg total) by mouth daily. For swelling   GUAIFENESIN (MUCINEX) 600 MG 12 HR TABLET    Take 1,200 mg by mouth daily. For congestion   MULTIPLE VITAMINS-MINERALS (MULTIVITAMINS THER. W/MINERALS) TABS    Take 1 tablet by mouth daily.   OMEPRAZOLE (PRILOSEC) 20 MG CAPSULE    Take 1 by mouth 30 min before the first meal of the day   POLYETHYLENE GLYCOL POWDER (GLYCOLAX) POWDER    Take 17 g by mouth 2 (two) times daily.   SENNOSIDES-DOCUSATE SODIUM  (SENOKOT-S) 8.6-50 MG TABLET    Take 1 tablet by mouth daily as needed. For constipation   SULINDAC (CLINORIL) 200 MG TABLET    Take 200 mg by mouth 2 (two) times daily.   TRAMADOL (ULTRAM) 50 MG TABLET    Take 1 tablet (50 mg total) by mouth every 6 (six) hours as needed for pain. As needed for pain  Modified Medications   Modified Medication Previous Medication   CARVEDILOL (COREG) 3.125 MG TABLET carvedilol (COREG) 3.125 MG tablet      TAKE ONE TABLET TWICE DAILY WITH A MEAL    Take 1 tablet (3.125 mg total) by mouth 2 (two) times daily with a meal.  Discontinued Medications   ACETAMINOPHEN (TYLENOL ARTHRITIS PAIN) 650 MG CR TABLET    Take 650 mg by mouth as needed for pain.

## 2013-07-25 ENCOUNTER — Other Ambulatory Visit: Payer: Self-pay | Admitting: Cardiovascular Disease

## 2013-08-04 ENCOUNTER — Ambulatory Visit (INDEPENDENT_AMBULATORY_CARE_PROVIDER_SITE_OTHER): Payer: Medicare Other | Admitting: Cardiovascular Disease

## 2013-08-04 ENCOUNTER — Encounter: Payer: Self-pay | Admitting: Cardiovascular Disease

## 2013-08-04 VITALS — BP 148/80 | HR 70 | Wt 147.0 lb

## 2013-08-04 DIAGNOSIS — I519 Heart disease, unspecified: Secondary | ICD-10-CM

## 2013-08-04 DIAGNOSIS — I10 Essential (primary) hypertension: Secondary | ICD-10-CM

## 2013-08-04 DIAGNOSIS — I872 Venous insufficiency (chronic) (peripheral): Secondary | ICD-10-CM

## 2013-08-04 NOTE — Patient Instructions (Addendum)
Your physician wants you to follow-up in: YEAR WITH DR NISHAN  You will receive a reminder letter in the mail two months in advance. If you don't receive a letter, please call our office to schedule the follow-up appointment.  Your physician recommends that you continue on your current medications as directed. Please refer to the Current Medication list given to you today. 

## 2013-08-04 NOTE — Assessment & Plan Note (Signed)
Stable continue low sodium diet

## 2013-08-04 NOTE — Assessment & Plan Note (Signed)
No recurrence of CHF Ambulating well with cane

## 2013-08-04 NOTE — Assessment & Plan Note (Signed)
Well controlled.  Continue current medications and low sodium Dash type diet.    

## 2013-08-04 NOTE — Progress Notes (Signed)
Patient ID: Heather [name], female   DOB: 04-19-1918, 79 y.o.   MRN: [id number] Heather [name] is an 79 year old pateint of Dr Teressa Lower last seen 10/11 with a history of congestive heart failure secondary to diastolic dysfunction, hypertension, atypical chest pain with negative Myoviews in 2007 and 2009, breast cancer status post left mastectomy Chronic phlem and cough with white mucos. On mucinex but fixated on this issue. No recent dyspnea or CHF. Recently seen by Dr Randa Evens GI with no recommendations.  Is on prilosec  She is very functional Has some LE edema on left leg from knee brace Also wearing back brace Sees Whitfield for ortho  ROS: Denies fever, malais, weight loss, blurry vision, decreased visual acuity, cough, sputum, SOB, hemoptysis, pleuritic pain, palpitaitons, heartburn, abdominal pain, melena, lower extremity edema, claudication, or rash.  All other systems reviewed and negative  General: Affect appropriate Healthy:  appears stated age HEENT: normal Neck supple with no adenopathy JVP normal no bruits no thyromegaly Lungs clear with no wheezing and good diaphragmatic motion Heart:  S1/S2 no murmur, no rub, gallop or click PMI normal Abdomen: benighn, BS positve, no tenderness, no AAA no bruit.  No HSM or HJR Distal pulses intact with no bruits No edema Neuro non-focal Skin warm and dry No muscular weakness   Current Outpatient Prescriptions  Medication Sig Dispense Refill  . ALPRAZolam (XANAX) 0.5 MG tablet Take 1/2 to 1 tablet by mouth three times daily as needed  90 tablet  5  . amLODipine (NORVASC) 2.5 MG tablet TAKE ONE TABLET BY MOUTH ONCE DAILY  30 tablet  6  . aspirin EC 81 MG tablet Take 81 mg by mouth daily.      . carvedilol (COREG) 3.125 MG tablet Take 3.125 mg by mouth 2 (two) times daily with a meal.      . carvedilol (COREG) 3.125 MG tablet TAKE ONE TABLET TWICE DAILY WITH A MEAL  60 tablet  6  . Cholecalciferol (VITAMIN D3) 1000 UNITS CAPS Take 1 capsule by  mouth daily. For your bones      . furosemide (LASIX) 40 MG tablet Take 1 tablet (40 mg total) by mouth daily. For swelling  30 tablet  6  . guaiFENesin (MUCINEX) 600 MG 12 hr tablet Take 1,200 mg by mouth daily. For congestion      . Multiple Vitamins-Minerals (MULTIVITAMINS THER. W/MINERALS) TABS Take 1 tablet by mouth daily.      Marland Kitchen omeprazole (PRILOSEC) 20 MG capsule Take 1 by mouth 30 min before the first meal of the day      . polyethylene glycol powder (GLYCOLAX) powder Take 17 g by mouth 2 (two) times daily.  527 g  1  . sennosides-docusate sodium (SENOKOT-S) 8.6-50 MG tablet Take 1 tablet by mouth daily as needed. For constipation      . sulindac (CLINORIL) 200 MG tablet Take 200 mg by mouth 2 (two) times daily.      . traMADol (ULTRAM) 50 MG tablet Take 1 tablet (50 mg total) by mouth every 6 (six) hours as needed for pain. As needed for pain  30 tablet  5   No current facility-administered medications for this visit.    Allergies  Fentanyl  Electrocardiogram: 02/13/13  SR rate 66 RBBB   Assessment and Plan

## 2013-08-15 ENCOUNTER — Ambulatory Visit: Payer: Medicare Other | Admitting: Gastroenterology

## 2013-08-30 ENCOUNTER — Ambulatory Visit: Payer: Medicare Other | Admitting: Pulmonary Disease

## 2013-09-01 ENCOUNTER — Telehealth: Payer: Self-pay | Admitting: Pulmonary Disease

## 2013-09-01 NOTE — Telephone Encounter (Signed)
ATCx 2 line busy. [name], CMA

## 2013-09-01 NOTE — Telephone Encounter (Signed)
Error.  Duplicate message.  Heather [name] °- °

## 2013-09-02 NOTE — Telephone Encounter (Signed)
[id number].[place] Hair, CMA  

## 2013-09-05 NOTE — Telephone Encounter (Signed)
lmomtcb for pt 

## 2013-09-05 NOTE — Telephone Encounter (Signed)
ATC pt line ringing busy x 3 wcb

## 2013-09-06 ENCOUNTER — Ambulatory Visit (INDEPENDENT_AMBULATORY_CARE_PROVIDER_SITE_OTHER): Payer: Medicare Other | Admitting: Pulmonary Disease

## 2013-09-06 ENCOUNTER — Encounter: Payer: Self-pay | Admitting: Pulmonary Disease

## 2013-09-06 ENCOUNTER — Other Ambulatory Visit (INDEPENDENT_AMBULATORY_CARE_PROVIDER_SITE_OTHER): Payer: Medicare Other

## 2013-09-06 ENCOUNTER — Ambulatory Visit (INDEPENDENT_AMBULATORY_CARE_PROVIDER_SITE_OTHER)
Admission: RE | Admit: 2013-09-06 | Discharge: 2013-09-06 | Disposition: A | Discharge status: Home / Self Care | Payer: Medicare Other | Source: Ambulatory Visit | Attending: Pulmonary Disease | Admitting: Pulmonary Disease

## 2013-09-06 VITALS — BP 128/60 | HR 84 | Temp 97.8°F | Ht 62.0 in | Wt 143.4 lb

## 2013-09-06 DIAGNOSIS — R059 Cough, unspecified: Secondary | ICD-10-CM

## 2013-09-06 DIAGNOSIS — R05 Cough: Secondary | ICD-10-CM

## 2013-09-06 DIAGNOSIS — M545 Low back pain, unspecified: Secondary | ICD-10-CM

## 2013-09-06 DIAGNOSIS — K5730 Diverticulosis of large intestine without perforation or abscess without bleeding: Secondary | ICD-10-CM

## 2013-09-06 DIAGNOSIS — I519 Heart disease, unspecified: Secondary | ICD-10-CM

## 2013-09-06 DIAGNOSIS — I872 Venous insufficiency (chronic) (peripheral): Secondary | ICD-10-CM

## 2013-09-06 DIAGNOSIS — R0602 Shortness of breath: Secondary | ICD-10-CM

## 2013-09-06 DIAGNOSIS — K5900 Constipation, unspecified: Secondary | ICD-10-CM

## 2013-09-06 DIAGNOSIS — F411 Generalized anxiety disorder: Secondary | ICD-10-CM

## 2013-09-06 DIAGNOSIS — I10 Essential (primary) hypertension: Secondary | ICD-10-CM

## 2013-09-06 DIAGNOSIS — K219 Gastro-esophageal reflux disease without esophagitis: Secondary | ICD-10-CM

## 2013-09-06 DIAGNOSIS — F450 Somatization disorder: Secondary | ICD-10-CM

## 2013-09-06 DIAGNOSIS — R609 Edema, unspecified: Secondary | ICD-10-CM

## 2013-09-06 DIAGNOSIS — M1990 Unspecified osteoarthritis, unspecified site: Secondary | ICD-10-CM

## 2013-09-06 DIAGNOSIS — Z853 Personal history of malignant neoplasm of breast: Secondary | ICD-10-CM

## 2013-09-06 DIAGNOSIS — N281 Cyst of kidney, acquired: Secondary | ICD-10-CM

## 2013-09-06 DIAGNOSIS — M47812 Spondylosis without myelopathy or radiculopathy, cervical region: Secondary | ICD-10-CM

## 2013-09-06 DIAGNOSIS — K449 Diaphragmatic hernia without obstruction or gangrene: Secondary | ICD-10-CM

## 2013-09-06 DIAGNOSIS — R6889 Other general symptoms and signs: Secondary | ICD-10-CM

## 2013-09-06 LAB — HEPATIC FUNCTION PANEL
ALT: 21 U/L (ref 0–35)
AST: 25 U/L (ref 0–37)
Albumin: 4 g/dL (ref 3.5–5.2)
Total Bilirubin: 1 mg/dL (ref 0.3–1.2)
Total Protein: 6.9 g/dL (ref 6.0–8.3)

## 2013-09-06 LAB — BASIC METABOLIC PANEL
BUN: 15 mg/dL (ref 6–23)
CO2: 28 mEq/L (ref 19–32)
Calcium: 9.4 mg/dL (ref 8.4–10.5)
Chloride: 103 mEq/L (ref 96–112)
Creatinine, Ser: 0.9 mg/dL (ref 0.4–1.2)
Potassium: 4.5 mEq/L (ref 3.5–5.1)

## 2013-09-06 LAB — CBC WITH DIFFERENTIAL/PLATELET
Basophils Relative: 0.4 % (ref 0.0–3.0)
Eosinophils Relative: 0.9 % (ref 0.0–5.0)
Hemoglobin: 14 g/dL (ref 12.0–15.0)
Lymphocytes Relative: 33.1 % (ref 12.0–46.0)
MCV: 93.1 fl (ref 78.0–100.0)
Neutrophils Relative %: 54.8 % (ref 43.0–77.0)
RBC: 4.55 Mil/uL (ref 3.87–5.11)
WBC: 6.2 10*3/uL (ref 4.5–10.5)

## 2013-09-06 LAB — SEDIMENTATION RATE: Sed Rate: 11 mm/hr (ref 0–22)

## 2013-09-06 NOTE — Patient Instructions (Signed)
Today we updated your med list in our EPIC system...    Continue your current medications the same...  Today we did your follow up CXR & blood work...    We will contact you w/ the results when available...   Let's plan a follow up visit in 3 months.Marland KitchenMarland Kitchen

## 2013-09-06 NOTE — Progress Notes (Signed)
Subjective:    Patient ID: Heather [name], female    DOB: 07/05/1918, 79 y.o.   MRN: [id number]  HPI 79 y/o BF here for a follow up visit... she has multiple medical problems including:  Dyspnea & Anxiety;  HBP;  Hx CP followed by DrBensimhon;  HH/ Gastritis;  Divertics/ Constip;  Functional Abd Pain;  Renal cyst followed by DrKimbrough;  Hx left breast [phone];  DJD/ CSpine spondy/ LBP/ CTS; and she has a cancer phobia...  ~  December 29, 2011:  623mo ROV & Heather [name] has her usual complement of somatic complaints> all seem stable w/o appreciable change; she does not want to see DrJEdwards again, but is happy w/ eval & communication from DrThorne at [facility] says her abd pain is coming from the arthritis in her back, & she has seen DrWhitfield for that... She lists mult OTC GI meds including> Prilosec, Miralax, Metamucil, Senakot, Mylicon...    BP controlled on her Coreg, Amlodipine, Lasix; BP= 120/68 today & denies CP/ palpit, ch in DOE, edema, etc...    She saw DrThorne recently for f/u & note pending; prev note 11/12 reviewed> she has done a thorough investigation...    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  ~  Mar 29, 2012:  623mo ROV & she continues w/ mult complaints- ran out of Dymista sample for nasal symptoms (didn't help), thinks her BP is too low, c/o swelling (has VI- reminded to elim sodium, wear support hose, elevate, take Lasix40), "constip" but stool is loose & soft (rec- Align), saw DrEdwards again for GI- she denies dysphagia, still c/o chr abd discomfort, etc; c/o "dark urine" it was clear and C&S was neg; she requires constant reassurance about her condition & that she does not have cancer; asked to take her Xanax more regularly...  We reviewed prob list, meds, xrays and labs>  See below>>   ~  June 28, 2012:  623mo ROV &  continues to have mult  somatic complaints & says "terrible, terrible, terrible" states she's lost all the muscles in her abd & now uses a girdle; "all my doctors say for me to see you" eg> DrEdwards, DrNishan, etc; she still believes that she has cancer "somewhere- just like my friend"... Then she launches into a flight of ideas of complaints> carpel tunnel symptoms (she wants to f/u w/ DrWhtfield), cough/ phlegm, dizzy, "it's my stomach", etc...    We reviewed prob list, meds, xrays and labs> see below for updates >>  ~  August 03, 2012:  23mo ROV & add-on at pt's insistance for "hocking- it's not a cough, it's a hock"; then she proceeds to clear her throat forcefully & "hock-up" some clear phlegm; she notes "thick white foamy slimy sputum w/ flecks of chopped meat in it"; she denies cough/ hemoptysis/ fcs, etc; she notes some mild dyspnea (at baseline) and occas CP from the force of the hocking; she is quite insistent on additional work-up & Rx for this perceived problem:     Exam is clear- no oral lesions, neck is neg, chest is clear...    CXR shows sl overinflation, clear lungs, normal heart size w/ elongation & tortuous Ao...    PFTs are wnl> FVC=2.62 (128%), FEV1=2.20 (158%), %1sec=84; mid-flows=274% of predicted...    Note> she is quite anxious & has a cancer phobia; again asked to  take her Alprazolam Tid regularly... We reviewed prob list, meds, xrays and labs> see below for update >> We decided on a trial regimen to help her "hocking" w/ short term f/u to assess efficacy> MUCINEX 600mg - 2Bid w/ lots of fluids; HYCODAN syrup- 1tspQid; DULERA100/5- 1puffBid then rinse w/ Listerine... Note> before completing her eval today she insisted in a right breast exam stating that DrBertrand insisted that her PCP examine her breast at every visit> it was neg.  ~  August 30, 2012:  27mo ROV & recheck> "I'm just falling apart, meds not helping, Dulera choked me up, mouth glued together" therefore she stopped the [facility] & does  better on no Rx she says "there is something wrong w/ me, I'm losing so much weight, I'm skin & bones" wt today= 156# which is up 8# from last visit; she is c/o snorting phlegm that she says tastes bitter & has little white balls in it (she could not produce any sput despite a lot of hocking in the office today (advised to continue Mucinex600mg -2Bid w/ fluids)......  Also c/o left lat CWP, tender ribs & wears lumbar brace/ rib binder, +takes Hydrocodone/ Tramadol as needed...     We reviewed prob list, meds, xrays and labs> see below for updates >> she declines the 2013 flu vaccine... LABS 10/13:  Chems-wnl;  CBC- wnl;  TSH= 1.79  ~ September 27, 2012: 27mo ROV & Heather [name] is rambling on w/ 'flight of ideas" today & due to this we tried a systems approach & touched on all these problems during her visit today>>     Resp symptoms> c/o SOB, hocking phlegm, "this throat business" for which she has seen DrRosen, ENT; now c/o decr hearing, thick phlegm, etc; We reviewed CXR 10/13- clear & wnl; PFT 10/13- normal airflow; Rec- Mucinex600mg -2Bid, lots of fluids, & f/u w/ ENT for hearing eval...     HBP> on Coreg3.125Bid, Amlod2.5, Lisin40; BP= 120/64 & we reviewed labs from 10/13- all wnl...     HxCP> on ASA81; followed by DrBensimhon/ [facility]hr for Cards & their notes are reviewed=> neg ischemic work up...     VI, Edema> she went to the ER 11/13 w/ edema; VDopplers were neg for DVT; they reiterated the recs for no salt, elevation, support hose, & continue Lasix40.     GI- HH, Gastirits, Divertics, chronic pain & "swelling"> on Omep20- taking 2/d & we discussed derc to one daily; plus Miralax & Senakot-S...     Renal cyst> she is very anxious about everything & has a cancer phobia; DrKimbrough followed this benign simple cyst for yrs- no change...     Hx Breast cancer> she had left mod radical mastectomy 1994 by DrBlievernicht- no known recurrence & she is given reassurance...     Ortho- DJD, Cspine spongy, LBP,  CTS> followed by DrWhitfield; on Vicodin & Tramadol but prefers the latter she says...     Anxiety> on Alpraz0.5mg Tid but "I'm not taking it very often" she says & encouraged to use more regularly...  We reviewed prob list, meds, xrays and labs> see below for updates >>   ~  October 27, 2012:  27mo ROV & Heather [name] has mult somatic complaints as usual- dry mouth at night, hocking thick phlegm, c/o stomach/ back/ etc... Again we reviewed the following problems during today's visit>>    Resp symptoms> on Mucinex2Bid +Fluids; she saw DrRosen, ENT 11/13- dysphagia, presbycusis, referral otalgia; exam was normal, barium swallow 11/13 showed diminished primary peristaltic wave, mild tertiary contractions,  prominent cricopharyngeus muscle, no hiatal hernia/ no reflux/ barium pill passed into the stomach without delay...    HBP> on Coreg3.125Bid, Amlod2.5, Lisin40; BP= 120/60 & she persists w/ mult somatic complaints...    HxCP> on ASA81; followed by DrBensimhon/ DrNishan for Cards & their notes are reviewed=> neg ischemic work up previously...     VI, Edema> she went to the ER 11/13 w/ edema; VDopplers were neg for DVT; they reiterated the recs for no salt, elevation, support hose, & continue Lasix40.     GI- HH, Gastirits, Divertics, chronic pain & "swelling"> on Omep20mg /d plus Miralax & Senakot-S; she states "my stomach has gone to my back".    Renal cyst> she is very anxious about everything & has a cancer phobia; DrKimbrough followed this benign simple cyst for yrs- no change...     Hx Breast cancer> she had left mod radical mastectomy 1994 by DrBlievernicht- no known recurrence & she is given reassurance...     Ortho- DJD, Cspine spondy, LBP, CTS> followed by DrWhitfield & he injected her right knee 1wk ago; on Vicodin & Tramadol but prefers the latter she says...     Anxiety> on Alpraz0.5mg Tid but "I'm not taking it very often" she says & encouraged to use more regularly...  We reviewed prob list, meds,  xrays and labs> see below for updates >>   ~  December 13, 2012:  6wk ROV & Heather [name] had a good holiday but launches into her usual litany of complaints> feels bad in the mornings, legs sore "they get hot", "I get hot in my back", c/o "hocking up little white balls of phlegm", stools are dark & small, etc... She thinks she needs massage for her back problems & has seen DrWhitfield w/ PT recommended... She is rec to increase Mucinex 600mg -2Bid w/ Fluids;  Not currently c/o dyspnea;  BP well controlled on meds;  She has chronic GI complaints managed by DrStark & she had no specific GI complaints today;  She has Vicodin & Tramadol for pain;  She has Therapist, sports for anxiety & rec (again) to take it more regularly...     We reviewed prob list, meds, xrays and labs> see below for updates >>   ~  January 07, 2013:  3-4wk ROV & add-on for multiple somatic complaints> MsHarris insisted on this add-on visit due to mult somatic complaints & when asked what specifically she was complaining about she launched into a flight of ideas all over the place> stiff; worse & worse; phlegm w/ white flecks & she cannot recalll our mult prev discussions about this w/ rec for Mucinex-2Bid & lots of fluids; legs jerking (rec for tonic water at bedtime); "my back is on fire" esp at night (rec to try Gabapentin 100mg  1-2 at bedtime); wt loss w/ fair appetite (rec to drink Ensure 2 cans/d betw meals); wants to try massage therapy (I told her ok w/ me)...  ~  March 14, 2013:  80mo ROV & Heather [name] persists w/ mult somatic complaints and seems almost inconsolable regarding her health; recall that she has a cancer phobia & she has been impossible to reassure in this regard no matter how many tests we run or specialists we send her to... Today she is concerned about "losing weight right & left" but our scale says she has gained 2# to 147# & her BMI= 28-29;  She continues to c/o harking phlegm & congestion ("thick lumpy slimy balls of white meat"),  states her urine is very yellow in  the evening, and her left arm & right leg are swelling... She is also c/o back pain & she has seen DrWhitfield, Ortho 4 times since I saw her last- XRays w/ extensive multilevel DDD in spine, Bone Scan w/o boney mets, "I'm getting therapy"... We discussed checking Sput C&S (NTF only), and increasing her Mucinex600mg  to 2Bid + fluids; her recent UA & cult were neg as well...    BP remains well controlled on her ;  GI is stable on Prilosec & laxatives, followed by DrJEdwards;  She has Xanax for anxiety but she still won't take it regularly as I havew recommended to her...     We reviewed prob list, meds, xrays and labs> see below for updates >>  NOTE: CT Chest, Abd, Pelvis done 3/14 for additional screening after call from Ortho PA regarding pt's c/o wt loss etc; reports reviewed=> no acute abn identified...   ~  May 30, 2013:  2-16mo ROV & Heather [name] is soon to be 79 y/o> stable without new complaints but says "terrible, terrible, terrible" & still mentions the perceived "swelling" in her left side of abd ("it's so big i have to hold it up" & asked to try an abd binder) and again mentions a mild cough productive of "little white balls of mucous"; she states "DrCrossley can't find where the phlegm is coming from, c/o "hocking little white balls that makes me sick";  She is on pain meds from [facility]- off Vicodin on Tramadol...     We reviewed prob list, meds, xrays and labs> see below for updates >> She is rec to continue f/u w/ her specialists to get the reassurance she needs to know that everything possible is being done to address her concerns... Asked to take the Alpraz0.5mg Tid regularly (she never does)...  ~  July 20, 2013:  6wk ROV & nothing has changed for Heather [name]- still c/o pain, swelling, etc... She has had follow ups w/GI & Ortho- notes reviewed; she remains very anxious w/ mult somatic complaints and as prev noted has a cancer phobia but is totally  impossible to console, reassure, etc... Wonders why she is losing so much weight (weight is up 4# to 152# today) & we reviewed no salt, elevation, support hose, & the Lasix...     We reviewed prob list, meds, xrays and labs> see below for updates >>  she refuses the Flu vaccine... She is asked to ret for Fasting blood work=> she never did...  ~  September 06, 2013:  6wk ROV & Heather [name] says "I'm sick, so clogged up, I can feel all my guts" (says she can feel her intestines, but has lost her stomach) w/ her usual mult somatic complaints but few physical findings;  We decided to do CXR, Ambulatory O2 check, and non-fasting labs today...     We reviewed prob list, meds, xrays and labs> see below for updates >> She wants handicap sticker- OK... CXR 10/14 showed normal heart size w/ tortuous atherosclerotic Ao, clear lungs w/ ?vague opac left mid zone adjacent to hilum (nothing seen in this area on CT 3/14) ?overlying shadows & we will f/u... Ambulatory O2 check> O2 sat= 98% on room air at rest w/ pulse=83, regular; Ambulated 3 laps w/ lowest O2 sat= 96% w/ pulse=108... LABS 10/14:  Chems- wnl;  CBC- wnl;  TSH=2.04;  VitD=71;  Sed=11...            Problem List:  RESPIRATORY SYMPTOMS >> c/o "hocking phlegm" DYSPNEA (ICD-786.9) - eval 5/11  c/o SOB- can't get a DB, can't get enough air in, etc... occurs at rest & w/ exerc "I'm very active despite my arthritis", talks rapidly in long sentences w/o apparent distress... no cough, phlegm, hemoptysis, etc- but as usual she is quite distressed by these symptoms and wants further eval> we discussed re-assessment w/ CXR (clear, NAD);  PFT (totally norm airflow);  Labs (all WNL);  Rx w/ ALPRAZOLAM 0.5mg  Tid & encouraged to take regularly. ~  CXR 4/13 showed norm heart size, clear lungs, mild TSpine spondylosis... ~  She continues to complain of drainage, throat symptoms, etc; Rx w/ Mucinex, OTC antihist, Nasonex, etc;  Seen by ENT, DrRosen w/ ?xerostomia & rec incr  water etc. ~  9/13: Add-on visit for "hocking, it's not a cough, it's a hock"> see above... ~  9/13: CXR shows sl overinflation, clear lungs, normal heart size w/ elongation & tortuous Ao;  PFTs are wnl> FVC=2.62 (128%), FEV1=2.20 (158%), %1sec=84; mid-flows=274% of predicted... We discussed trial rx for her symptoms- Mucinex, Fluids, Hycodan, Dulera100- 1puffbid. ~  10/13: she reports INTOL to [facility] w/ throat & mouth symptoms "like glue" therefore stopped this med; advised to continue the others. ~  3/14:  CT Chest (done after call from Ortho PA regarding her symptoms)=> then CTA (done via an ER visit) showed no PE, no acute findings, +atherosclerotic changes, several scattered tiny nodules 3-12mm size & no change from 2009 scan, s/p left mastectomy, DJD Tspine...  ~  4/14: she is asked to increase the Mucinex600mg  to [facility] + plenty of water daily... ~  10/14: CXR showed normal heart size w/ tortuous atherosclerotic Ao, clear lungs w/ ?vague opac left mid zone adjacent to hilum (nothing seen in this area on CT 3/14) ?overlying shadows & we will f/u...   HYPERTENSION (ICD-401.9) - on AMLODIPINE 2.5mg /d,  COREG 3.125mg Bid,  LASIX 40mg - taking 1/2tab/d because 1 tab makes her too weak...  ~  7/12:  C/o incr edema in legs & advised to elim sodium from diet & incr her LASIX to 40mg /d... ~  10/12:  Prev Cozaar 50mg  was stopped 10/12 via the ER when she presented w/ ?angioedema of the tongue... ~  2/13:  BP= 120/68 & doing well on 3 med regimen & off Losartan. ~  5/13:  BP= 110/60 & she has mult somatic complaints but denies HA, CP, palpit, ch in SOB or edema... ~  8/13:  BP= 112/58 & there is no change in her mult somatic complaints... ~  9/13:  BP= 100/60 on Coreg3.125Bid & Amlod2.5 & prn Lasix... ~  10/13:  BP= 120/58 & she has mult somatic complaints as before... ~ 11/13: on Coreg3.125Bid, Amlod2.5, Lisin40; BP= 120/64 & we reviewed labs from 10/13- all wnl...  ~  12/13: on Coreg3.125Bid, Amlod2.5,  Lisin40; BP= 120/60 & she persists w/ mult somatic complaints... ~  4/14: on Coreg3.125Bid, Amlod2.5, Lisin40; BP= 124/60 & she persists w/ mult somatic complaints. ~  7/14:  On same , BP=112/58, & she is reassured... ~  10/14: on same 3 meds; BP= 128/60 & she has the same chronic symptoms and complaints...  Hx of CHEST PAIN (ICD-786.50) - she takes ASA 81mg /d + Tylenol Prn... followed by DrBensimhon for Cards. ~  Baseline EKG w/ RBBB & LAD, no acute changes... ~  NuclearStressTest done 2007 & 2009 showed normal- without scar or ischemia, EF=67%...   ~  2DEcho 7/08 showed mild AI, mild diastolic dysfunction, EF=55-60%. ~  CT Abd 1/09 & 9/12 showed incidental coronary  calcif, & aorto-iliac atherosclerotic changes as well... ~  8/13: she saw DrNishan> HBP, DD, atypCP w/ neg Myoviews & no changes made... ~  10/13:  She uses a back brace/ abd binder to help her CWP...  VENOUS INSUFFICIENCY (ICD-459.81) - she has mod VI and follows a low sodium diet, elevates legs when able, and wears support hose. ~  7/12:  C/o incr edema & we reviewed a 2gm sodium diet & increased her Lasix from 20mg /d to 40mg /d... ~  5/13:  She has persist complaints but mild chr VI w/ min edema & reminded to elim sodium, elev, support hose, take the Lasix... ~ 11/13: she went to the ER w/ edema; VDopplers were neg for DVT; they reiterated the recs for no salt, elevation, support hose, & continue Lasix40.  HIATAL HERNIA (ICD-553.3) & GASTRITIS (ICD-535.50) - on OMEPRAZOLE 20mg /d & followed by DrJEdwards for GI having fired DrStark & [facility] in past. ~  last EGD was 2/08 w/ 3cm HH & gastitis... ~  CT Abd 1/09 revealed calcif gallstones, & mild ectasia of AA... ~  CT Abd 1/11 showed left colon divertics, tiny hepatic lesions w/o change & right renal cyst stable. ~  CT Abd 9/12 showed divertics (no inflamm), right renal & left hep lobe cysts, atherosclerotic changes, NAD... ~  Seen by DrEdwards 4/13> c/o dysphagia & congestion; he  notes mult GI complaints, known esoph dysmotility w/o stricture, hard to pin down her complaints... ~  She has followed up w/ DrStark regarding her concerns about swelling in her abd... ~  CT Abd Pelvis 3/14 showed Tiny cysts in the lateral segment left hepatic lobe, 1.4 cm right upper pole renal cyst, atherosclerotic calcifications of the abdominal aorta and branch vessels, DJD spine...  DIVERTICULOSIS, COLON (ICD-562.10) - followed regularly by DrStark. ~  colonoscopy 7/03 by DrStark showing divertics only... we discussed Miralax + Senakot-S for constipation. ~  colonoscopy 2/11 showed divertics, 3mm polyp, hems...  ? of ABDOMINAL PAIN & SYMPTOMATOLOGY - she had a very thorough work up in 2009> she has a cancer phobia and is very difficult to reassure her that everything appropriate has been done & no cancer found... Mult additional GI evals since then from [facility], [facility] (DrEdwards), [facility] (DrThorne)... ~  eval by GI- DrStark 12/08 but pt wasn't satisfied w/ his examination... ~  full labs 1/09 were WNL, sed=20... ~  CT Abd/ Pelvis 1/09= no acute abn in abd (calcif gallstones & mild ectasia of AA), calcif fibroid in the pelvis, & ?regarding the left ovary. ~  referral to DrFontaine, GYN> his notes are reviewed... ~  follow up w/ DrKimbrough, Urology> his notes are reviewed... ~  eval DrWhitfield for Orthopedics- rec shots in back by [facility] w/ some improvement. ~  repeat GI eval 1/11 by DrStark w/ CT & colonoscpy as above- no acute problems. ~  5/11 & 7/11 >> persist complaints w/ neg recheck by DrStark & DrKimbrough... ~  12/11:  routine GYN surveillance DrFontaine found anteverted uterus w/ fibroids, atrophic ovaries bilat, no mass. ~  Repeat GI eval 4/12 by DrStark> he tried to reassure the pt, felt some symptoms might be from poor abd wall muscle tone, rec continue Omep, laxatives... ~  6/12:  She sought another GI opinion DrEdwards> he did not feel she needed any additional  testing... ~  She tells me she has yet another appt to see a gastroenterologist at [facility]==> seen by DrThorne 8/12 & note reviewed... ~  EGD by DrThorne  9/12 w/ mult whitish nodular plaques in cardia & fundus> Bx results pending & she is very concerned (remember her hx cancer phobia). ~  She continues to f/u w/ DrThorne at [facility] & is happy w/ her eval... ~  Now back w/ DrStark at [facility]...  RENAL CYST (ICD-593.2) - she has a 1 cm right renal cyst seen on CT in 2007 and followed by DrKimbrough... f/u CT Abd 1/09 - no change in the cyst... f/u renal ultrasound 1/10 w/ small bilat cysts... f/u CT Abd 1/11- no change in cyst. ~  she saw DrKimbrough 7/11 for LBP, microhematuria, urge incontinence- stable, no additional Rx. ~  she saw DrKimbrough 1/12 for urge incontinence, freq, nocturia> rec Desmopressin 0.2mg - 1/2 tab Qhs.  CARCINOMA, BREAST, LEFT (ICD-174.9) - Surgery 9/94 by DrBlievernicht w/ Left modified radical mastectomy... +estrogen receptors and Rx w/ Tamoxifen... f/u mammograms all neg... breast exam on right has been neg- no nodules palpated... She says that DrBertrand insisted that her PCP check her right breast at each & every office visit to be sure she doesn't develop any nodules...  DEGENERATIVE JOINT DISEASE (ICD-715.90) - on OTC meds + VICODIN Prn... she has end-stage OA of Right Knee per DrWhitfield treated w/ shots in the past... she has a knee brace and a cane for ambulation... ~  8/13:  She wants a better pain pill & we discussed change to [facility] 5/325 Tid prn... ~  12/13: she tells me that DrWhitfield injected her knee last wk- improved...  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 Rx w/ rest, heat, Percocet, and f/u w/ Ortho...  Hx of LOW BACK PAIN, CHRONIC (ICD-724.2) - eval DrWhitfield w/ rec for shots by DrNewton & she's feeling sl better & wears back brace... ~  5/10: mult somatic complaints-  primarily c/o left side/ postero-lat rib pain...  Exam w/ tender left lat/ post ribs... Bone Scan= neg... ~  MRI Lumbar spine 7/10 by DrWhitfield... ~  Epidural steroid shot by DrNewton 8/10 & 1/11 ~  Repeat LBP eval by DrWhitfield w/ another MRI lumbar spine showing degenerative changes w/ mild progression since 7/10 MRI, mild-mod sp stenosis & lat recess stenosis at several levels ~  DrWhitfield referred her to [facility] to consider more shots but she is not so inclined... ~  6/13: she saw DrTooke w/ lumbar spondylosis; he felt that he had nothing to offer her & rec incr Tramadol to Qid... ~  2014:  She has had numerous visits w/ DrWhitfield/ [facility] for injections, XRays, scans etc...  CARPAL TUNNEL SYNDROME, BILATERAL (ICD-354.0) - she had prev right carpal tunnel release by DrWhitfield and was c/o increasing discomfort in her left wrist despite wrist splint Qhs- had left carpal tunnel release 1/10 by DrWhitfield...  ANXIETY (ICD-300.00) - on ALPRAZOLAM 0.5mg  Tid & encouraged to take it regularly... she has a cancer phobia & it is very difficult to reassure her regarding her symptoms and our evaluations... ~  7/13: she went to the ER c/o swelling of the tongue but eval by EDP indicated that her tongue was normal & not swollen, she was not on an ACE, given Pred Rx anyway. ~  She has been asked to incr the ALprazolam to regular dosing but she is reluctant...   Past Surgical History  Procedure Laterality Date  . Left mastectomy    . Carpal tunnel release      Outpatient Encounter Prescriptions as of 09/06/2013  Medication Sig Dispense Refill  . ALPRAZolam (XANAX) 0.5 MG tablet Take 1/2 to 1 tablet by mouth three times daily as needed  90 tablet  5  . amLODipine (NORVASC) 2.5 MG tablet TAKE ONE TABLET BY MOUTH ONCE DAILY  30 tablet  6  . aspirin EC 81 MG tablet Take 81 mg by mouth daily.      . carvedilol (COREG) 3.125 MG tablet TAKE ONE TABLET TWICE DAILY WITH A MEAL  60 tablet  6  .  Cholecalciferol (VITAMIN D3) 1000 UNITS CAPS Take 1 capsule by mouth daily. For your bones      . furosemide (LASIX) 40 MG tablet Take 1 tablet (40 mg total) by mouth daily. For swelling  30 tablet  6  . guaiFENesin (MUCINEX) 600 MG 12 hr tablet Take 1,200 mg by mouth daily. For congestion      . Multiple Vitamins-Minerals (MULTIVITAMINS THER. W/MINERALS) TABS Take 1 tablet by mouth daily.      Marland Kitchen omeprazole (PRILOSEC) 20 MG capsule Take 1 by mouth 30 min before the first meal of the day      . polyethylene glycol powder (GLYCOLAX) powder Take 17 g by mouth 2 (two) times daily.  527 g  1  . sennosides-docusate sodium (SENOKOT-S) 8.6-50 MG tablet Take 2 tablets by mouth daily as needed. For constipation      . sulindac (CLINORIL) 200 MG tablet Take 200 mg by mouth 2 (two) times daily.      . traMADol (ULTRAM) 50 MG tablet Take 1 tablet (50 mg total) by mouth every 6 (six) hours as needed for pain. As needed for pain  30 tablet  5  . [DISCONTINUED] carvedilol (COREG) 3.125 MG tablet Take 3.125 mg by mouth 2 (two) times daily with a meal.       No facility-administered encounter medications on file as of 09/06/2013.    Allergies  Allergen Reactions  . Fentanyl     REACTION: nausea    Current Medications, Allergies, Past Medical History, Past Surgical History, Family History, and Social History were reviewed in [employer] record.    Review of Systems      See HPI - all other systems neg except as noted... She has mult somatic complaints and anxiety. The patient complains of decreased hearing, chest discomfort, dyspnea on exertion, abdominal pain, incontinence, muscle weakness, and difficulty walking.  The patient denies anorexia, fever, vision loss, hoarseness, syncope, peripheral edema, prolonged cough, headaches, hemoptysis, melena, hematochezia, severe indigestion/heartburn, hematuria, transient blindness, depression, abnormal bleeding, enlarged lymph nodes, and  angioedema.     Objective:   Physical Exam   WD, WN, 79 y/o BF in NAD... she is very anxious... GENERAL:  Alert & oriented; pleasant & cooperative... HEENT:  [place]/AT, EOM-wnl, PERRLA, EACs-clear, TMs-wnl, NOSE-clear, THROAT-clear & wnl, no lesions seen. NECK:  Neck ROM decreased, no JVD; normal carotid impulses w/o bruits; no thyromegaly or nodules palpated; no lymphadenopathy. CHEST:  Clear to P & A; without wheezes/ rales/ or rhonchi heard... + tender right lat 11th & 12th ribs on palp. BREAST:  s/p left mastectomy... right breast normal- without nodules palpated... HEART:  Regular Rhythm; without murmurs/ rubs/ or gallops detected... ABDOMEN:  Soft & nontender; normal bowel sounds; no organomegaly or masses palpated... EXT: without deformities, mod arthritic changes; no varicose veins/ +venous insuffic/ tr edema. NEURO:  CN's intact; no focal neuro deficits... DERM:  Small sl excoriated area of dermatitis left shin- no drainage,no rash, etc...  RADIOLOGY DATA:  Reviewed in the [facility] EMR & discussed w/ the patient...  LABORATORY DATA:  Reviewed in the EPIC EMR & discussed w/ the patient...   Assessment & Plan:    RESPIRATORY SYMPTOMS w/ Dyspnea & throat clearing>  She had a neg pulm eval 2011 and numerous times thereafter; she insisted on additional eval 9/13 for her chr throat clearing symptom; CXR w/o acute changes & PFTs normal for her 79 y/o lungs; she is quite insistent on med rx for her problem & we reviewed Mucinex, Hycodan, Dulera, & Listerine (she was intol to the [facility]); pt very anxious & Alprazolam seems to help when she takes it...  HBP>  Controlled on low doses of several meds including Coreg, Norvasc & Lasix;  She is encouraged to take meds regularly...  Hx CP> evaluated for Cards by DrBensimon & he has not been in favor of invasive testing; stable on conservative med rx...  VI/ Edema>  She is concerned about incr edema & we reviewed 2gm sodium diet & rec keep Lasix at  40mg /d...  GI> HH, Gastritis, Divertics, vague abd pain>  I tried once again to reassure her about the thorough GI evaluations that her has undergone; she is happy w/ DrThorne's eval & will f/u w/ her & in the meanwhile continue rx w/ Prilosec40, Miralax, Metamucil, Senakot, Mylicon, etc... She has re-established w/ [facility]- their notes reviewed...  Renal cyst>  This is benign & has been followed by DrKimbrough...  Hx left Breast Cancer>  No known recurrence...  DJD> Cervical spondylosis & chronic LBP>  She has been evaluated by DrWhitfield for Ortho & given epid steroid shots by DrNewton... She wants a better pain pill & we tried [facility] 5/325 tid as needed but she prefers the Tramadol; knee shots from DrWhitfield helped...  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  Anxiety>  Asked once again to increase the Alprazolam to 1/2 - 1 tab TID regularly.Marland KitchenMarland Kitchen

## 2013-09-06 NOTE — Telephone Encounter (Signed)
Pt was seen by SN today.  Will sign off on msg.

## 2013-09-07 LAB — VITAMIN D 25 HYDROXY (VIT D DEFICIENCY, FRACTURES): Vit D, 25-Hydroxy: 71 ng/mL (ref 30–89)

## 2013-09-14 ENCOUNTER — Telehealth: Payer: Self-pay | Admitting: Pulmonary Disease

## 2013-09-14 DIAGNOSIS — R32 Unspecified urinary incontinence: Secondary | ICD-10-CM

## 2013-09-14 NOTE — Telephone Encounter (Signed)
Per SN---  Refer to urology-- For urine incontinence If she wants we can do a clean catch ua and urine culture here today.  thanks

## 2013-09-14 NOTE — Telephone Encounter (Signed)
Pt is going to see what urlogy states. Referral placed

## 2013-09-14 NOTE — Telephone Encounter (Signed)
Will forward to PCC's to make them aware

## 2013-09-14 NOTE — Telephone Encounter (Signed)
appt to see dr Aldean Ast 09/15/13@2pm  Tobe Sos

## 2013-09-14 NOTE — Telephone Encounter (Signed)
I spoke with pt. she has noticed increase in having to urinate. Happening through the night. She is wearing depends at night. She does not think she has seen a urologists. Pt states this has been going on x 2 months but worse in the past week. When she takes her depend off in the morning it is full of urine. Please advise SN thanks Last OV 09/06/13 Pending 12/07/13  Allergies  Allergen Reactions  . Fentanyl     REACTION: nausea

## 2013-09-14 NOTE — Telephone Encounter (Signed)
Busy signal, NA WCB

## 2013-09-19 ENCOUNTER — Telehealth: Payer: Self-pay | Admitting: Pulmonary Disease

## 2013-09-19 NOTE — Telephone Encounter (Signed)
I spoke with pt. She reports she had a missed call from [place]. I advised her I did not see where we had called her. It looks like it was an old message. Nothing further needed

## 2013-11-16 ENCOUNTER — Telehealth: Payer: Self-pay | Admitting: Pulmonary Disease

## 2013-11-16 NOTE — Telephone Encounter (Signed)
Pt c/o increased stomach pain and issues. Pt was advised by Dr Excell Seltzer to follow up with Kriste Basque. Mosely advised pt to D/C mucinex..said she was taking too much--decreased dose.  Allergies  Allergen Reactions  . Fentanyl     REACTION: nausea   Pt requesting appt to be seen soon.  Will send to Dr Kriste Basque to advise --------------------  Per SN: pt needs to see GI Pt to contact Dr Russella Dar today.

## 2013-11-18 ENCOUNTER — Telehealth: Payer: Self-pay | Admitting: Gastroenterology

## 2013-11-18 NOTE — Telephone Encounter (Signed)
Patient states she has left sided abdominal pain and weight loss and Dr. Lenna Gilford told to call and make an appt. Offered her sooner appt with Amy next week and patient states she will talk her ride and call me back .

## 2013-11-18 NOTE — Telephone Encounter (Signed)
Left a message for patient to return my call. 

## 2013-11-21 ENCOUNTER — Ambulatory Visit: Payer: Medicare Other | Admitting: Physician Assistant

## 2013-11-23 ENCOUNTER — Other Ambulatory Visit (INDEPENDENT_AMBULATORY_CARE_PROVIDER_SITE_OTHER): Payer: Medicare Other

## 2013-11-23 ENCOUNTER — Encounter: Payer: Self-pay | Admitting: Physician Assistant

## 2013-11-23 ENCOUNTER — Ambulatory Visit (INDEPENDENT_AMBULATORY_CARE_PROVIDER_SITE_OTHER): Payer: Medicare Other | Admitting: Physician Assistant

## 2013-11-23 VITALS — BP 120/70 | HR 66 | Ht 62.0 in | Wt 146.6 lb

## 2013-11-23 DIAGNOSIS — Z853 Personal history of malignant neoplasm of breast: Secondary | ICD-10-CM

## 2013-11-23 DIAGNOSIS — R1032 Left lower quadrant pain: Secondary | ICD-10-CM

## 2013-11-23 DIAGNOSIS — Z8739 Personal history of other diseases of the musculoskeletal system and connective tissue: Secondary | ICD-10-CM

## 2013-11-23 LAB — CBC WITH DIFFERENTIAL/PLATELET
BASOS PCT: 0.6 % (ref 0.0–3.0)
Basophils Absolute: 0 10*3/uL (ref 0.0–0.1)
EOS ABS: 0.1 10*3/uL (ref 0.0–0.7)
EOS PCT: 1.9 % (ref 0.0–5.0)
HCT: 40.4 % (ref 36.0–46.0)
Hemoglobin: 13.4 g/dL (ref 12.0–15.0)
LYMPHS PCT: 37.8 % (ref 12.0–46.0)
Lymphs Abs: 2.7 10*3/uL (ref 0.7–4.0)
MCHC: 33.3 g/dL (ref 30.0–36.0)
MCV: 93.5 fl (ref 78.0–100.0)
Monocytes Absolute: 0.7 10*3/uL (ref 0.1–1.0)
Monocytes Relative: 10 % (ref 3.0–12.0)
NEUTROS PCT: 49.7 % (ref 43.0–77.0)
Neutro Abs: 3.5 10*3/uL (ref 1.4–7.7)
Platelets: 216 10*3/uL (ref 150.0–400.0)
RBC: 4.32 Mil/uL (ref 3.87–5.11)
RDW: 14.4 % (ref 11.5–14.6)
WBC: 7 10*3/uL (ref 4.5–10.5)

## 2013-11-23 LAB — BASIC METABOLIC PANEL
BUN: 21 mg/dL (ref 6–23)
CHLORIDE: 103 meq/L (ref 96–112)
CO2: 31 meq/L (ref 19–32)
CREATININE: 1.5 mg/dL — AB (ref 0.4–1.2)
Calcium: 9.1 mg/dL (ref 8.4–10.5)
GFR: 43.3 mL/min — ABNORMAL LOW (ref >60.00)
GLUCOSE: 88 mg/dL (ref 70–99)
POTASSIUM: 4 meq/L (ref 3.5–5.1)
Sodium: 140 mEq/L (ref 135–145)

## 2013-11-23 NOTE — Progress Notes (Signed)
Subjective:    Patient ID: Heather [name], female    DOB: 1918-06-20, 80 y.o.   MRN: [id number]  HPI Heather [name] is a 80 year old African American female known to Dr. Fuller Plan with history of chronic GERD, diverticulosis, remote breast cancer, hypertension, diastolic dysfunction, chronic anxiety,and chronic bronchitis. She comes in today with complaints of left-sided abdominal pain and weight loss. On further questioning she says that she has been having the same abdominal pain since February or March of last year and that she is frustrated because no one seems to be willing to do much testing on her because of her age. He says she's very uncomfortable and feels best if she puts pressure on her lower abdomen with standing. She also has a pulling type discomfort in her vaginal area. She does not have any prolapse that she is aware of. She's not having any difficulty urinating. She has mild problems with constipation no melena or hematochezia. She has not had any change in her pain over the past couple of months, no fever or chills appetite has been fair. Actually her weight was documented at 143 in October of 2014 and is documented at 146 in our office today dose he states he's lost quite a bit of weight over the past year . She also has significant problems with lower back pain and arthritis and is aware that she has disc disease. She has been using Ultram as needed but was recently told by her orthopedist to alternate Ultram and Tylenol every 4-6 hours. She does not feel that her left-sided abdominal pain is coming from her back. Last colonoscopy was done in February 2011 showing moderate diverticulosis from the ascending colon to the sigmoid colon and a 3 mm sessile polyp was removed which showed benign polypoid mucosa. Patient had CT scan of the abdomen and pelvis done in February 2014 which showed tiny pulmonary nodules felt to be stable and minimal prominence of the gastric cardia, diverticulosis without  any evidence of diverticulitis. Patient specifically states that she would like further x-rays done.    Review of Systems  Constitutional: Negative.   HENT: Negative.   Eyes: Negative.   Respiratory: Positive for cough.   Cardiovascular: Negative.   Gastrointestinal: Positive for abdominal pain.  Endocrine: Negative.   Genitourinary: Negative.   Musculoskeletal: Positive for arthralgias and back pain.  Skin: Negative.   Allergic/Immunologic: Negative.   Neurological: Negative.   Hematological: Negative.   Psychiatric/Behavioral: Negative.    Outpatient Prescriptions Prior to Visit  Medication Sig Dispense Refill  . ALPRAZolam (XANAX) 0.5 MG tablet Take 1/2 to 1 tablet by mouth three times daily as needed  90 tablet  5  . amLODipine (NORVASC) 2.5 MG tablet TAKE ONE TABLET BY MOUTH ONCE DAILY  30 tablet  6  . aspirin EC 81 MG tablet Take 81 mg by mouth daily.      . carvedilol (COREG) 3.125 MG tablet TAKE ONE TABLET TWICE DAILY WITH A MEAL  60 tablet  6  . Cholecalciferol (VITAMIN D3) 1000 UNITS CAPS Take 1 capsule by mouth daily. For your bones      . furosemide (LASIX) 40 MG tablet Take 1 tablet (40 mg total) by mouth daily. For swelling  30 tablet  6  . guaiFENesin (MUCINEX) 600 MG 12 hr tablet Take 1,200 mg by mouth daily. For congestion      . Multiple Vitamins-Minerals (MULTIVITAMINS THER. W/MINERALS) TABS Take 1 tablet by mouth daily.      Marland Kitchen  omeprazole (PRILOSEC) 20 MG capsule Take 1 by mouth 30 min before the first meal of the day      . polyethylene glycol powder (GLYCOLAX) powder Take 17 g by mouth 2 (two) times daily.  527 g  1  . sennosides-docusate sodium (SENOKOT-S) 8.6-50 MG tablet Take 2 tablets by mouth daily as needed. For constipation      . sulindac (CLINORIL) 200 MG tablet Take 200 mg by mouth 2 (two) times daily.      . traMADol (ULTRAM) 50 MG tablet Take 1 tablet (50 mg total) by mouth every 6 (six) hours as needed for pain. As needed for pain  30 tablet  5    No facility-administered medications prior to visit.   Allergies  Allergen Reactions  . Fentanyl     REACTION: nausea   Patient Active Problem List   Diagnosis Date Noted  . Musculoskeletal pain 06/23/2013  . Abdominal pain, left lower quadrant 06/22/2013  . Chronic bronchitis 05/30/2013  . Somatization disorder 03/14/2013  . Hx of breast cancer 08/30/2012  . Multiple somatic complaints 06/28/2012  . Rhinitis 05/11/2012  . URI (upper respiratory infection) 03/10/2012  . Skin rash 07/16/2011  . Edema 06/12/2011  . COUGH 01/31/2011  . GERD 03/27/2010  . DIZZINESS 08/31/2009  . DYSPNEA 06/26/2009  . DIASTOLIC DYSFUNCTION 81/19/1478  . CHEST WALL PAIN, ACUTE 03/21/2009  . FLANK PAIN, LEFT 03/21/2009  . WEAKNESS 02/21/2009  . CONSTIPATION 01/18/2009  . FLATULENCE-GAS-BLOATING 01/18/2009  . CARPAL TUNNEL SYNDROME, BILATERAL 10/03/2008  . CERVICAL SPONDYLOSIS WITHOUT MYELOPATHY 03/21/2008  . HIATAL HERNIA 12/26/2007  . RENAL CYST 12/26/2007  . CHEST PAIN 12/26/2007  . ANXIETY 09/15/2007  . HYPERTENSION 09/15/2007  . VENOUS INSUFFICIENCY 09/15/2007  . DEGENERATIVE JOINT DISEASE 09/15/2007  . LOW BACK PAIN, CHRONIC 09/15/2007  . DIVERTICULOSIS, COLON 05/24/2002   History  Substance Use Topics  . Smoking status: Never Smoker   . Smokeless tobacco: Never Used  . Alcohol Use: No      family history is negative for Colon cancer.  Objective:   Physical Exam well-developed elderly American female in no acute distress blood pressure 120/70 pulse 66 height 5 foot 2 weight 146. HEENT; nontraumatic normocephalic EOMI PERRLA sclera anicteric, Supple; no JVD, Cardiovascular; regular rate and rhythm with S1-S2 no murmur or gallop, Pulmonary ;clear bilaterally, Abdomen ;soft nondistended, bowel sounds are active she is tender in the left lower quadrant there is no guarding or rebound no palpable mass or hepatosplenomegaly I cannot appreciate a hernia. She also complains of a bulge  over her pubic bone been on exam this is consistent with a fat pad external perineal exam shows no evidence of vaginal prolapse., Rectal; exam not done, Extremities ;no clubbing cyanosis or edema skin warm and dry, Psych; mood and affect appropriate      Assessment & Plan:  #72  80 year old African American female with persistent left lower quadrant pain radiating into the groin. Etiology is not clear no intra-abdominal findings on CT scan one year ago. I suspect that her pain may be more radicular in nature with known L4 and L5 disc disease. No hernia appreciated on exam. #2 diverticulosis #3 chronic GERD #4 chronic back pain #6 hypertension  Plan; went over patient's medications carefully with her and explained how to alternate Ultram 50 mg every 4-6 hours with Tylenol 350 mg every 4-6 hours. She previously had a prescription for Clinoril but was not certain that that was helpful in may be best for her  to stick with the above regimen for her back and abdominal pain. Schedule for CT scan of the abdomen and pelvis with contrast. If this is negative I do not believe she needs any further GI workup.

## 2013-11-23 NOTE — Patient Instructions (Addendum)
Please go to the basement level to have your labs drawn.  Alternate the Tramadol and Tylenol every 4-6 hours.  Take Miralax daily.   You have been scheduled for a CT scan of the abdomen and pelvis at [facility] ([address]---this is in the same building as [facility]).   You are scheduled on Friday 11-25-2013 at 11:30 am . You should arrive at 9:15 am prior to your appointment time for registration. Please follow the written instructions below on the day of your exam:  WARNING: IF YOU ARE ALLERGIC TO IODINE/X-RAY DYE, PLEASE NOTIFY RADIOLOGY IMMEDIATELY AT [phone]! YOU WILL BE GIVEN A 13 HOUR PREMEDICATION PREP.  1) Do not eat or drink anything after 7:30 am   (4 hours prior to your test) 2) You have been given 2 bottles of oral contrast to drink. The solution may taste better if refrigerated, but do NOT add ice or any other liquid to this solution. Shake  well before drinking.    Drink 1 bottle of contrast @9:30 am  (2 hours prior to your exam)  Drink 1 bottle of contrast @ 10:30 am  (1 hour prior to your exam)  You may take any medications as prescribed with a small amount of water except for the following: Metformin, Glucophage, Glucovance, Avandamet, Riomet, Fortamet, Actoplus Met, Janumet, Glumetza or Metaglip. The above medications must be held the day of the exam AND 48 hours after the exam.  The purpose of you drinking the oral contrast is to aid in the visualization of your intestinal tract. The contrast solution may cause some diarrhea. Before your exam is started, you will be given a small amount of fluid to drink. Depending on your individual set of symptoms, you may also receive an intravenous injection of x-ray contrast/dye. Plan on being at [facility] for 30 minutes or long, depending on the type of exam you are having performed.  If you have any questions regarding your exam or if you need to reschedule, you may call the CT department at  [phone] between the hours of 8:00 am and 5:00 pm, Monday-Friday.  ________________________________________________________________________  

## 2013-11-24 NOTE — Progress Notes (Signed)
Reviewed and agree with management plan.  Kortlyn Koltz T. Cecile Guevara, MD FACG 

## 2013-11-25 ENCOUNTER — Ambulatory Visit (INDEPENDENT_AMBULATORY_CARE_PROVIDER_SITE_OTHER)
Admission: RE | Admit: 2013-11-25 | Discharge: 2013-11-25 | Disposition: A | Discharge status: Home / Self Care | Payer: Medicare Other | Source: Ambulatory Visit | Attending: Physician Assistant | Admitting: Physician Assistant

## 2013-11-25 DIAGNOSIS — Z8739 Personal history of other diseases of the musculoskeletal system and connective tissue: Secondary | ICD-10-CM

## 2013-11-25 DIAGNOSIS — Z853 Personal history of malignant neoplasm of breast: Secondary | ICD-10-CM

## 2013-11-25 DIAGNOSIS — R1032 Left lower quadrant pain: Secondary | ICD-10-CM

## 2013-11-28 ENCOUNTER — Telehealth: Payer: Self-pay | Admitting: Physician Assistant

## 2013-11-28 NOTE — Telephone Encounter (Signed)
CT shows no problems in her abdomen-she has severe disc disease in lumbar spine, and may be feeling pain radiating around into LLQ from that

## 2013-11-28 NOTE — Telephone Encounter (Signed)
Patient given results. She states her stomach is swollen and moving around. She states it is not her back. Please, advise.

## 2013-11-28 NOTE — Telephone Encounter (Signed)
Spoke with patient and she is asking for CT results. Please, advise

## 2013-11-30 NOTE — Telephone Encounter (Signed)
Spoke with patient and gave her Heather [name], [place] recommendations. She would like to schedule with Dr. Fuller Plan. Scheduled on 12/14/13 at 2:15 PM.

## 2013-11-30 NOTE — Telephone Encounter (Signed)
She has a pain, but there is nothing abnormal on Ct scan ...would treat the pain with alternating tylenol and Ultram like we discussed in office. i dont think she needs any other GI workup. She can make a follow up appt with primary GI Md- but i believe they have said the same thing in the past

## 2013-12-01 ENCOUNTER — Telehealth: Payer: Self-pay | Admitting: Gastroenterology

## 2013-12-01 DIAGNOSIS — K921 Melena: Secondary | ICD-10-CM

## 2013-12-01 NOTE — Addendum Note (Signed)
Addended by: Lance Morin on: 12/01/2013 04:52 PM   Modules accepted: Orders

## 2013-12-01 NOTE — Telephone Encounter (Signed)
Informed pt Heather [name] would like for her to get a lab drawn before her appt tomorrow. Pt stated understanding.

## 2013-12-01 NOTE — Telephone Encounter (Signed)
Pt seen by Nicoletta Ba, PA on 11/23/13 for weight loss, possible worms, and L side abdominal pain. Pt has a hx of chronic GERD, diverticulosis, remote breast cancer, HTN, diastolic dysfunction, chronic anxiety and chronic bronchitis.  Pt c/o left side pain for almost a year and she feels best while stading while putting pressure on her lower abdomen. She also describes a pulling type discomfort in her vaginal area. Amy gave pt Ultram 50 mg Q4-6 hrs and tylenol 350 mg every 4-6 hours. CT scan on 11/25/13 with no acute finding to account for pt's s&s. Pt reports her stools have been blackish for 2-3 days and it has her worried. She is taking her Miralax,, ultram and tylenol as ordered. She again c/o the pain in her left side. She states she can't lie on her left side because of the pulling. Again, she is worried about the black stools. Pt will see Tye Savoy, NP tomorrow ; Dr Fuller Plan has hospital duty this week. She has a f/u appt with Dr Fuller Plan on 12/14/13.

## 2013-12-02 ENCOUNTER — Other Ambulatory Visit (INDEPENDENT_AMBULATORY_CARE_PROVIDER_SITE_OTHER): Payer: Medicare Other

## 2013-12-02 ENCOUNTER — Ambulatory Visit (INDEPENDENT_AMBULATORY_CARE_PROVIDER_SITE_OTHER): Payer: Medicare Other | Admitting: Nurse Practitioner

## 2013-12-02 ENCOUNTER — Encounter: Payer: Self-pay | Admitting: Nurse Practitioner

## 2013-12-02 VITALS — BP 98/60 | HR 64 | Ht 62.6 in | Wt 144.3 lb

## 2013-12-02 DIAGNOSIS — K921 Melena: Secondary | ICD-10-CM

## 2013-12-02 DIAGNOSIS — R195 Other fecal abnormalities: Secondary | ICD-10-CM

## 2013-12-02 LAB — CBC WITH DIFFERENTIAL/PLATELET
BASOS ABS: 0 10*3/uL (ref 0.0–0.1)
Basophils Relative: 0.5 % (ref 0.0–3.0)
EOS ABS: 0.1 10*3/uL (ref 0.0–0.7)
EOS PCT: 2 % (ref 0.0–5.0)
HEMATOCRIT: 39.8 % (ref 36.0–46.0)
Hemoglobin: 13.2 g/dL (ref 12.0–15.0)
LYMPHS ABS: 2.3 10*3/uL (ref 0.7–4.0)
Lymphocytes Relative: 44 % (ref 12.0–46.0)
MCHC: 33.2 g/dL (ref 30.0–36.0)
MCV: 93 fl (ref 78.0–100.0)
MONO ABS: 0.7 10*3/uL (ref 0.1–1.0)
Monocytes Relative: 12.3 % — ABNORMAL HIGH (ref 3.0–12.0)
NEUTROS PCT: 41.2 % — AB (ref 43.0–77.0)
Neutro Abs: 2.2 10*3/uL (ref 1.4–7.7)
Platelets: 206 10*3/uL (ref 150.0–400.0)
RBC: 4.28 Mil/uL (ref 3.87–5.11)
RDW: 14.1 % (ref 11.5–14.6)
WBC: 5.3 10*3/uL (ref 4.5–10.5)

## 2013-12-02 NOTE — Progress Notes (Signed)
     History of Present Illness:  Patient is a 80 year old female known to Dr. Fuller Plan with history of chronic GERD, diverticulosis, remote breast cancer, hypertension, diastolic dysfunction, chronic anxiety,and chronic bronchitis. She has been seen several times by [place], as well as Dr. Lenna Gilford for LLQ pain and bulging. Patient was just seen in the office earlier this month, CTscan was unrevealing. She call yesterday with complaints of black stool. No bismuth or iron.   She is still concerned about LLQ bulging. She is also concerned about chronic leg pain, carpal tunnel pain. Her vaginal area is swollen, she can't rest at night.    Current Medications, Allergies, Past Medical History, Past Surgical History, Family History and Social History were reviewed in [employer] record.  Studies:   Ct Abdomen Pelvis Wo Contrast  11/25/2013   CLINICAL DATA:  Left lower quadrant abdominal pain. History of breast cancer. Abdominal tenderness on examination.  EXAM: CT ABDOMEN AND PELVIS WITHOUT CONTRAST  TECHNIQUE: Multidetector CT imaging of the abdomen and pelvis was performed following the standard protocol without intravenous contrast.  COMPARISON:  CT of the abdomen and pelvis 01/20/2013.  FINDINGS: Lung Bases: Calcifications of the aortic valve.  Abdomen/Pelvis: Small calcification in segment 8 of the liver is unchanged, most compatible with a small granuloma. The unenhanced appearance of the gallbladder, pancreas, spleen, bilateral adrenal glands and the left kidney is unremarkable. 1.7 cm low-attenuation lesion in the anterior aspect of the upper pole of the right kidney is incompletely characterized on today's non contrast CT examination, but is similar to the prior study and therefore favored to represent a small cyst.  Atherosclerosis throughout the abdominal and pelvic vasculature, without definite aneurysm. No significant volume of ascites. No pneumoperitoneum. No pathologic distention  of small bowel. Normal appendix. No definite lymphadenopathy identified within the abdomen or pelvis on today's non contrast CT examination uterus and ovaries are atrophic. Urinary bladder is unremarkable in appearance.  Musculoskeletal: Multilevel degenerative disc disease throughout the lumbar spine, most severe at L3-L4, L4-L5 and L5-S1. There are no aggressive appearing lytic or blastic lesions noted in the visualized portions of the skeleton.  IMPRESSION: 1. No acute findings to account for the patient's symptoms. 2. Normal appendix. 3. Atherosclerosis. 4. Additional incidental findings, as above.   Electronically Signed   By: Vinnie Langton M.D.   On: 11/25/2013 13:31   Physical Exam: General: Pleasant, well developed , black female in no acute distress Head: Normocephalic and atraumatic Eyes:  sclerae anicteric, conjunctiva pink  Ears: Normal auditory acuity Lungs: Clear throughout to auscultation Heart: Regular rate and rhythm Abdomen: Soft, non distended, non-tender. No masses, no hepatomegaly. Normal bowel sounds Rectal: greenish-brown, heme negative stool in vault Musculoskeletal: Symmetrical with no gross deformities  Extremities: No edema  Neurological: Alert oriented x 4, grossly nonfocal Psychological:  Alert and cooperative. Normal mood and affect  Assessment and Recommendations: 32. 80 year old female with complaints of black stool x 3 days. Her hgb today is at baseline (13.2). Stool is not black nor heme positive on exam. Reassurance given that this doesn't seem to be a GI bleed.   2. Chronic abdominal complaints, mainly LLQ. This has been addressed many times. Recent CTscan unrevealing. Pain not GI related.

## 2013-12-02 NOTE — Progress Notes (Signed)
Reviewed and agree with management plan. Follow up with PCP. No further GI evaluation is needed.  Pricilla Riffle. Fuller Plan, MD [facility]

## 2013-12-02 NOTE — Patient Instructions (Signed)
Continue wearing the abdominal binder.

## 2013-12-07 ENCOUNTER — Other Ambulatory Visit: Payer: Self-pay | Admitting: Pulmonary Disease

## 2013-12-07 ENCOUNTER — Encounter: Payer: Self-pay | Admitting: Pulmonary Disease

## 2013-12-07 ENCOUNTER — Ambulatory Visit (INDEPENDENT_AMBULATORY_CARE_PROVIDER_SITE_OTHER): Payer: Medicare Other | Admitting: Pulmonary Disease

## 2013-12-07 VITALS — BP 96/60 | HR 75 | Temp 97.2°F | Ht 62.0 in | Wt 145.4 lb

## 2013-12-07 DIAGNOSIS — M545 Low back pain, unspecified: Secondary | ICD-10-CM

## 2013-12-07 DIAGNOSIS — R0602 Shortness of breath: Secondary | ICD-10-CM

## 2013-12-07 DIAGNOSIS — M1990 Unspecified osteoarthritis, unspecified site: Secondary | ICD-10-CM

## 2013-12-07 DIAGNOSIS — R142 Eructation: Secondary | ICD-10-CM

## 2013-12-07 DIAGNOSIS — R05 Cough: Secondary | ICD-10-CM

## 2013-12-07 DIAGNOSIS — K5730 Diverticulosis of large intestine without perforation or abscess without bleeding: Secondary | ICD-10-CM

## 2013-12-07 DIAGNOSIS — K449 Diaphragmatic hernia without obstruction or gangrene: Secondary | ICD-10-CM

## 2013-12-07 DIAGNOSIS — K219 Gastro-esophageal reflux disease without esophagitis: Secondary | ICD-10-CM

## 2013-12-07 DIAGNOSIS — R609 Edema, unspecified: Secondary | ICD-10-CM

## 2013-12-07 DIAGNOSIS — I872 Venous insufficiency (chronic) (peripheral): Secondary | ICD-10-CM

## 2013-12-07 DIAGNOSIS — F450 Somatization disorder: Secondary | ICD-10-CM

## 2013-12-07 DIAGNOSIS — R141 Gas pain: Secondary | ICD-10-CM

## 2013-12-07 DIAGNOSIS — I10 Essential (primary) hypertension: Secondary | ICD-10-CM

## 2013-12-07 DIAGNOSIS — R143 Flatulence: Secondary | ICD-10-CM

## 2013-12-07 DIAGNOSIS — K5900 Constipation, unspecified: Secondary | ICD-10-CM

## 2013-12-07 DIAGNOSIS — R059 Cough, unspecified: Secondary | ICD-10-CM

## 2013-12-07 DIAGNOSIS — I519 Heart disease, unspecified: Secondary | ICD-10-CM

## 2013-12-07 DIAGNOSIS — F411 Generalized anxiety disorder: Secondary | ICD-10-CM

## 2013-12-07 MED ORDER — AZITHROMYCIN 250 MG PO TABS: ORAL_TABLET | ORAL | Status: DC

## 2013-12-07 NOTE — Patient Instructions (Signed)
Today we updated your med list in our EPIC system...    Continue your current medications the same...  We refilled your prescription for a ZPak...  Call for any questions...  Let's plan a follow up visit in 24mo, sooner if needed for problems.Marland KitchenMarland Kitchen

## 2013-12-14 ENCOUNTER — Ambulatory Visit: Payer: Medicare Other | Admitting: Gastroenterology

## 2013-12-15 ENCOUNTER — Telehealth: Payer: Self-pay | Admitting: Pulmonary Disease

## 2013-12-15 NOTE — Telephone Encounter (Signed)
Spoke with pt and given instructions from Dr Lenna Gilford.  Pt verbalized understanding.

## 2013-12-15 NOTE — Telephone Encounter (Signed)
Pt c/o prod cough (thick , creamy) ongoing for past 3 months but worse in past 3 days.  Mucus causes a choking feeling.  Stomach feels hot.  Denies SOB or wheezing.  Pt takes Prilosec daily.  Finished Zpak several days ago.  Please advise.

## 2013-12-15 NOTE — Telephone Encounter (Signed)
ATC x 2 - Line busy

## 2013-12-15 NOTE — Telephone Encounter (Signed)
Per SN---  Only thing that SN knows to do is:  mucinex 600 mg  2 po bid Lots of fluids Robitussin prn Increase the prilosec bid

## 2013-12-30 ENCOUNTER — Other Ambulatory Visit: Payer: Self-pay | Admitting: Orthopaedic Surgery

## 2013-12-30 ENCOUNTER — Ambulatory Visit
Admission: RE | Admit: 2013-12-30 | Discharge: 2013-12-30 | Disposition: A | Discharge status: Home / Self Care | Payer: Medicare Other | Source: Ambulatory Visit | Attending: Orthopaedic Surgery | Admitting: Orthopaedic Surgery

## 2013-12-30 DIAGNOSIS — M79604 Pain in right leg: Secondary | ICD-10-CM

## 2014-01-06 NOTE — Progress Notes (Signed)
Subjective:    Patient ID: Heather [name], female    DOB: 1918-01-09, 80 y.o.   MRN: [id number]  HPI 80 y/o BF here for a follow up visit... she has multiple medical problems including:  Dyspnea & Anxiety;  HBP;  Hx CP followed by DrBensimhon;  HH/ Gastritis;  Divertics/ Constip;  Functional Abd Pain;  Renal cyst followed by DrKimbrough;  Hx left breast [phone];  DJD/ CSpine spondy/ LBP/ CTS; and she has a cancer phobia...  ~  June 28, 2012:  46moROV & Amanee continues to have mult somatic complaints & says "terrible, terrible, terrible" states she's lost all the muscles in her abd & now uses a girdle; "all my doctors say for me to see you" eg> DrEdwards, DrNishan, etc; she still believes that she has cancer "somewhere- just like my friend"... Then she launches into a flight of ideas of complaints> carpel tunnel symptoms (she wants to f/u w/ DrWhtfield), cough/ phlegm, dizzy, "it's my stomach", etc...    We reviewed prob list, meds, xrays and labs> see below for updates >>  ~  August 03, 2012:  152moOV & add-on at pt's insistance for "hocking- it's not a cough, it's a hock"; then she proceeds to clear her throat forcefully & "hock-up" some clear phlegm; she notes "thick white foamy slimy sputum w/ flecks of chopped meat in it"; she denies cough/ hemoptysis/ fcs, etc; she notes some mild dyspnea (at baseline) and occas CP from the force of the hocking; she is quite insistent on additional work-up & Rx for this perceived problem:     Exam is clear- no oral lesions, neck is neg, chest is clear...    CXR shows sl overinflation, clear lungs, normal heart size w/ elongation & tortuous Ao...    PFTs are wnl> FVC=2.62 (128%), FEV1=2.20 (158%), %1sec=84; mid-flows=274% of predicted...    Note> she is quite anxious & has a cancer phobia; again asked to take her Alprazolam Tid regularly... We reviewed prob list, meds, xrays and labs> see below for update >> We decided on a trial regimen to help her  "hocking" w/ short term f/u to assess efficacy> MUCINEX 60082m2Bid w/ lots of fluids; HYCODAN syrup- 1tspQid; DULERA100/5- 1puffBid then rinse w/ Listerine... Note> before completing her eval today she insisted in a right breast exam stating that DrBertrand insisted that her PCP examine her breast at every visit> it was neg.  ~  August 30, 2012:  68mo65mo & recheck> "I'm just falling apart, meds not helping, Dulera choked me up, mouth glued together" therefore she stopped the [facility] better on no Rx she says "there is something wrong w/ me, I'm losing so much weight, I'm skin & bones" wt today= 156# which is up 8# from last visit; she is c/o snorting phlegm that she says tastes bitter & has little white balls in it (she could not produce any sput despite a lot of hocking in the office today (advised to continue Mucinex600mg41md w/ fluids)......  Also c/o left lat CWP, tender ribs & wears lumbar brace/ rib binder, +takes Hydrocodone/ Tramadol as needed...     We reviewed prob list, meds, xrays and labs> see below for updates >> she declines the 2013 flu vaccine... LABS 10/13:  Chems-wnl;  CBC- wnl;  TSH= 1.79  ~ September 27, 2012: 68mo R43mo Jolissa is rambling on w/ 'flight of ideas" today & due to this we tried a systems approach & touched on all these problems  during her visit today>>     Resp symptoms> c/o SOB, hocking phlegm, "this throat business" for which she has seen DrRosen, ENT; now c/o decr hearing, thick phlegm, etc; We reviewed CXR 10/13- clear & wnl; PFT 10/13- normal airflow; Rec- Mucinex6764m-2Bid, lots of fluids, & f/u w/ ENT for hearing eval...     HBP> on Coreg3.125Bid, Amlod2.5, Lisin40; BP= 120/64 & we reviewed labs from 10/13- all wnl...     HxCP> on ASA81; followed by DrBensimhon/ DCherly Hensenfor Cards & their notes are reviewed=> neg ischemic work up...     VI, Edema> she went to the ER 11/13 w/ edema; VDopplers were neg for DVT; they reiterated the recs for no salt,  elevation, support hose, & continue Lasix40.     GI- HH, Gastirits, Divertics, chronic pain & "swelling"> on Omep20- taking 2/d & we discussed derc to one daily; plus Miralax & Senakot-S...     Renal cyst> she is very anxious about everything & has a cancer phobia; DrKimbrough followed this benign simple cyst for yrs- no change...     Hx Breast cancer> she had left mod radical mastectomy 1994 by DrBlievernicht- no known recurrence & she is given reassurance...     Ortho- DJD, Cspine spongy, LBP, CTS> followed by DrWhitfield; on Vicodin & Tramadol but prefers the latter she says...     Anxiety> on Alpraz0.513mid but "I'm not taking it very often" she says & encouraged to use more regularly...  We reviewed prob list, meds, xrays and labs> see below for updates >>   ~  October 27, 2012:  64m364moV & [name] has mult somatic complaints as usual- dry mouth at night, hocking thick phlegm, c/o stomach/ back/ etc... Again we reviewed the following problems during today's visit>>    Resp symptoms> on Mucinex2Bid +Fluids; she saw DrRosen, ENT 11/13- dysphagia, presbycusis, referral otalgia; exam was normal, barium swallow 11/13 showed diminished primary peristaltic wave, mild tertiary contractions, prominent cricopharyngeus muscle, no hiatal hernia/ no reflux/ barium pill passed into the stomach without delay...    HBP> on Coreg3.125Bid, Amlod2.5, Lisin40; BP= 120/60 & she persists w/ mult somatic complaints...    HxCP> on ASA81; followed by DrBensimhon/ DrNishan for Cards & their notes are reviewed=> neg ischemic work up previously...     VI, Edema> she went to the ER 11/13 w/ edema; VDopplers were neg for DVT; they reiterated the recs for no salt, elevation, support hose, & continue Lasix40.     GI- HH, Gastirits, Divertics, chronic pain & "swelling"> on Omep20m2mplus Miralax & Senakot-S; she states "my stomach has gone to my back".    Renal cyst> she is very anxious about everything & has a cancer phobia;  DrKimbrough followed this benign simple cyst for yrs- no change...     Hx Breast cancer> she had left mod radical mastectomy 1994 by DrBlievernicht- no known recurrence & she is given reassurance...     Ortho- DJD, Cspine spondy, LBP, CTS> followed by DrWhitfield & he injected her right knee 1wk ago; on Vicodin & Tramadol but prefers the latter she says...     Anxiety> on Alpraz0.5mgT84mbut "I'm not taking it very often" she says & encouraged to use more regularly...  We reviewed prob list, meds, xrays and labs> see below for updates >>   ~  December 13, 2012:  6wk RSavoya good holiday but launches into her usual litany of complaints> feels bad in the mornings, legs sore "they get hot", "  I get hot in my back", c/o "hocking up little white balls of phlegm", stools are dark & small, etc... She thinks she needs massage for her back problems & has seen DrWhitfield w/ PT recommended... She is rec to increase Mucinex 693m-2Bid w/ Fluids;  Not currently c/o dyspnea;  BP well controlled on meds;  She has chronic GI complaints managed by DrStark & she had no specific GI complaints today;  She has Vicodin & Tramadol for pain;  She has AOptician, dispensingfor anxiety & rec (again) to take it more regularly...     We reviewed prob list, meds, xrays and labs> see below for updates >>   ~  January 07, 2013:  3-4wk ROV & add-on for multiple somatic complaints> MsHarris insisted on this add-on visit due to mult somatic complaints & when asked what specifically she was complaining about she launched into a flight of ideas all over the place> stiff; worse & worse; phlegm w/ white flecks & she cannot recalll our mult prev discussions about this w/ rec for Mucinex-2Bid & lots of fluids; legs jerking (rec for tonic water at bedtime); "my back is on fire" esp at night (rec to try Gabapentin 1074m1-2 at bedtime); wt loss w/ fair appetite (rec to drink Ensure 2 cans/d betw meals); wants to try massage therapy (I told her ok w/  me)...  ~  March 14, 2013:  32m732moV & Jiayi persists w/ mult somatic complaints and seems almost inconsolable regarding her health; recall that she has a cancer phobia & she has been impossible to reassure in this regard no matter how many tests we run or specialists we send her to... Today she is concerned about "losing weight right & left" but our scale says she has gained 2# to 147# & her BMI= 28-29;  She continues to c/o harking phlegm & congestion ("thick lumpy slimy balls of white meat"), states her urine is very yellow in the evening, and her left arm & right leg are swelling... She is also c/o back pain & she has seen DrWhitfield, Ortho 4 times since I saw her last- XRays w/ extensive multilevel DDD in spine, Bone Scan w/o boney mets, "I'm getting therapy"... We discussed checking Sput C&S (NTF only), and increasing her Mucinex600m3m 2Bid + fluids; her recent UA & cult were neg as well...    BP remains well controlled on her 3med25m GI is stable on Prilosec & laxatives, followed by DrJEdwards;  She has Xanax for anxiety but she still won't take it regularly as I havew recommended to her...     We reviewed prob list, meds, xrays and labs> see below for updates >>  NOTE: CT Chest, Abd, Pelvis done 3/14 for additional screening after call from Ortho PA regarding pt's c/o wt loss etc; reports reviewed=> no acute abn identified...   ~  May 30, 2013:  2-65mo R27mo DorethNaliahon to be 89 y/o64stable without new complaints but says "terrible, terrible, terrible" & still mentions the perceived "swelling" in her left side of abd ("it's so big i have to hold it up" & asked to try an abd binder) and again mentions a mild cough productive of "little white balls of mucous"; she states "DrCrossley can't find where the phlegm is coming from, c/o "hocking little white balls that makes me sick";  She is [facility] Tramadol...     We reviewed prob list, meds, xrays and labs>  see  below for updates >> She is rec to continue f/u w/ her specialists to get the reassurance she needs to know that everything possible is being done to address her concerns... Asked to take the Alpraz0.81mTid regularly (she never does)...  ~  July 20, 2013:  6wk ROV & nothing has changed for Dorthea- still c/o pain, swelling, etc... She has had follow ups w/GI & Ortho- notes reviewed; she remains very anxious w/ mult somatic complaints and as prev noted has a cancer phobia but is totally impossible to console, reassure, etc... Wonders why she is losing so much weight (weight is up 4# to 152# today) & we reviewed no salt, elevation, support hose, & the Lasix...     We reviewed prob list, meds, xrays and labs> see below for updates >>  she refuses the Flu vaccine... She is asked to ret for Fasting blood work=> she never did...  ~  September 06, 2013:  6wk [place]astic Beachsays "I'm sick, so clogged up, I can feel all my guts" (says she can feel her intestines, but has lost her stomach) w/ her usual mult somatic complaints but few physical findings;  We decided to do CXR, Ambulatory O2 check, and non-fasting labs today...     We reviewed prob list, meds, xrays and labs> see below for updates >> She wants handicap sticker- OK... CXR 10/14 showed normal heart size w/ tortuous atherosclerotic Ao, clear lungs w/ ?vague opac left mid zone adjacent to hilum (nothing seen in this area on CT 3/14) ?overlying shadows & we will f/u... Ambulatory O2 check> O2 sat= 98% on room air at rest w/ pulse=83, regular; Ambulated 3 laps w/ lowest O2 sat= 96% w/ pulse=108... LABS 10/14:  Chems- wnl;  CBC- wnl;  TSH=2.04;  VitD=71;  Sed=11...   ~  December 07, 2013:  384moOV & Ramon has no new complaints today; she voiced concern over prev [facility] visit w/ N&V, had IV, ?got infected "they had to operate on my hand", but her CC is the bill from [employer]. She persists w/ GI complaints> gas, N&V, swelling, etc;  Throat symptoms and  she's seen DrCrossley;  LBP and she's followed by DrWhitfield, shots w/o help she says;  Cancer phobia as we've noted in the past but she is unconsolable...     HBP> on Coreg3.125Bid, Amlod2.5, Lisin40; BP= 100/60 & she persists w/ mult somatic complaints...    HxCP> on ASA81; followed by DrBensimhon/ DrNishan for Cards & their prev notes are reviewed=> neg ischemic work up previously...     VI, Edema> she went to the ER 11/13 w/ edema; VDopplers were neg for DVT; they reiterated the recs for no salt, elevation, support hose, & continue Lasix40.     GI- HH, Gastirits, Divertics, chronic pain & "swelling"> on Omep2049m plus Miralax & Senakot-S; she persists w/ mult GI complaints despite [facility], [facility]-DrThorne...    Renal cyst> she is very anxious about everything & has a cancer phobia; DrKimbrough followed this benign simple cyst for yrs- no change...     Hx Breast cancer> she had left mod radical mastectomy 1994 by DrBlievernicht- no known recurrence & she is given reassurance...     Ortho- DJD, Cspine spondy, LBP, CTS> followed by DrWhitfield & he injected her right knee & her back; on Vicodin & Tramadol but prefers the latter she says...     Anxiety> on Alpraz0.5mg38m but "I'm not taking it very often" she says &  encouraged to use more regularly...  We reviewed prob list, meds, xrays and labs> see below for updates >> she wants a ZPak for the drainage in her throat...           Problem List:  RESPIRATORY SYMPTOMS >> c/o "hocking phlegm" DYSPNEA (ICD-786.9) - eval 5/11 c/o SOB- can't get a DB, can't get enough air in, etc... occurs at rest & w/ exerc "I'm very active despite my arthritis", talks rapidly in long sentences w/o apparent distress... no cough, phlegm, hemoptysis, etc- but as usual she is quite distressed by these symptoms and wants further eval> we discussed re-assessment w/ CXR (clear, NAD);  PFT (totally norm airflow);  Labs (all WNL);  Rx w/ ALPRAZOLAM 0.22m  Tid & encouraged to take regularly. ~  CXR 4/13 showed norm heart size, clear lungs, mild TSpine spondylosis... ~  She continues to complain of drainage, throat symptoms, etc; Rx w/ Mucinex, OTC antihist, Nasonex, etc;  Seen by ENT, DrRosen w/ ?xerostomia & rec incr water etc. ~  9/13: Add-on visit for "hocking, it's not a cough, it's a hock"> see above... ~  9/13: CXR shows sl overinflation, clear lungs, normal heart size w/ elongation & tortuous Ao;  PFTs are wnl> FVC=2.62 (128%), FEV1=2.20 (158%), %1sec=84; mid-flows=274% of predicted... We discussed trial rx for her symptoms- Mucinex, Fluids, Hycodan, Dulera100- 1puffbid. ~  10/13: she [facility]/ throat & mouth symptoms "like glue" therefore stopped this med; advised to continue the others. ~  3/14:  CT Chest (done after call from Ortho PA regarding her symptoms)=> then CTA (done via an ER visit) showed no PE, no acute findings, +atherosclerotic changes, several scattered tiny nodules 3-44msize & no change from 2009 scan, s/p left mastectomy, DJD Tspine...  ~  4/14: she is asked to increase the Mucinex60021mo 2BiPam [facility] Tulsaplenty of water daily... ~  10/14: CXR showed normal heart size w/ tortuous atherosclerotic Ao, clear lungs w/ ?vague opac left mid zone adjacent to hilum (nothing seen in this area on CT 3/14) ?overlying shadows & we will f/u...   HYPERTENSION (ICD-401.9) - on AMLODIPINE 2.5mg15m  COREG 3.125mg78m  LASIX 40mg-72ming 1/2tab/d because 1 tab makes her too weak...  ~  7/12:  C/o incr edema in legs & advised to elim sodium from diet & incr her LASIX to 40mg/d36m~  10/12:  Prev Cozaar 50mg wa52mopped 10/12 via the ER when she presented w/ ?angioedema of the tongue... ~  2/13:  BP= 120/68 & doing well on 3 med regimen & off Losartan. ~  5/13:  BP= 110/60 & she has mult somatic complaints but denies HA, CP, palpit, ch in SOB or edema... ~  8/13:  BP= 112/58 & there is no change in her mult somatic complaints... ~  9/13:   BP= 100/60 on Coreg3.125Bid & Amlod2.5 & prn Lasix... ~  10/13:  BP= 120/58 & she has mult somatic complaints as before... ~ 11/13: on Coreg3.125Bid, Amlod2.5, Lisin40; BP= 120/64 & we reviewed labs from 10/13- all wnl...  ~  12/13: on Coreg3.125Bid, Amlod2.5, Lisin40; BP= 120/60 & she persists w/ mult somatic complaints... ~  4/14: on Coreg3.125Bid, Amlod2.5, Lisin40; BP= 124/60 & she persists w/ mult somatic complaints. ~  7/14:  On same 3meds, B46m12/58, & she is reassured... ~  10/14: on same 3 meds; BP= 128/60 & she has the same chronic symptoms and complaints... ~  1/15: on same 3 meds; BP= 100/60 & she is stable...Marland KitchenMarland Kitchen  Hx of CHEST PAIN (ICD-786.50) - she takes ASA 87m/d + Tylenol Prn... followed by DrBensimhon for Cards. ~  Baseline EKG w/ RBBB & LAD, no acute changes... ~  NuclearStressTest done 2007 & 2009 showed normal- without scar or ischemia, EF=67%...   ~  2DEcho 7/08 showed mild AI, mild diastolic dysfunction, EZY=60-63% ~  CT Abd 1/09 & 9/12 showed incidental coronary calcif, & aorto-iliac atherosclerotic changes as well... ~  8/13: she saw DrNishan> HBP, DD, atypCP w/ neg Myoviews & no changes made... ~  10/13:  She uses a back brace/ abd binder to help her CWP...  VENOUS INSUFFICIENCY (ICD-459.81) - she has mod VI and follows a low sodium diet, elevates legs when able, and wears support hose. ~  7/12:  C/o incr edema & we reviewed a 2gm sodium diet & increased her Lasix from 221md to 402m... ~  5/13:  She has persist complaints but mild chr VI w/ min edema & reminded to elim sodium, elev, support hose, take the Lasix... ~ 11/13: she went to the ER w/ edema; VDopplers were neg for DVT; they reiterated the recs for no salt, elevation, support hose, & continue Lasix40.  HIATAL HERNIA (ICD-553.3) & GASTRITIS (ICD-535.50) - on OMEPRAZOLE 49m59m& followed by DrJEdwards for GI having fired DrStark & [facility] in past. ~  last EGD was 2/08 w/ 3cm HH & gastitis... ~  CT Abd 1/09  revealed calcif gallstones, & mild ectasia of AA... ~  CT Abd 1/11 showed left colon divertics, tiny hepatic lesions w/o change & right renal cyst stable. ~  CT Abd 9/12 showed divertics (no inflamm), right renal & left hep lobe cysts, atherosclerotic changes, NAD... ~  Seen by DrEdwards 4/13> c/o dysphagia & congestion; he notes mult GI complaints, known esoph dysmotility w/o stricture, hard to pin down her complaints... ~  She has followed up w/ DrStark regarding her concerns about swelling in her abd... ~  CT Abd Pelvis 3/14 showed Tiny cysts in the lateral segment left hepatic lobe, 1.4 cm right upper pole renal cyst, atherosclerotic calcifications of the abdominal aorta and branch vessels, DJD spine...  DIVERTICULOSIS, COLON (ICD-562.10) - followed regularly by DrStark. ~  colonoscopy 7/03 by DrStark showing divertics only... we discussed Miralax + Senakot-S for constipation. ~  colonoscopy 2/11 showed divertics, 3mm 73myp, hems...  ? of ABDOMINAL PAIN & SYMPTOMATOLOGY - she had a very thorough work up in 2009> she has a cancer phobia and is very difficult to reassure her that everything appropriate has been done & no cancer found... Mult additional GI evals since then from [facility], [facility] (DrEdwards), [facility] (DrThorne)... ~  eval by GI- DrStark 12/08 but pt wasn't satisfied w/ his examination... ~  full labs 1/09 were WNL, sed=20... ~  CT Abd/ Pelvis 1/09= no acute abn in abd (calcif gallstones & mild ectasia of AA), calcif fibroid in the pelvis, & ?regarding the left ovary. ~  referral to DrFontaine, GYN> his notes are reviewed... ~  follow up w/ DrKimbrough, Urology> his notes are reviewed... ~  eval DrWhitfield for Orthopedics- rec shots in back by DrNewCrozer-[facility]ster [facility] Centerome improvement. ~  repeat GI eval 1/11 by DrStark w/ CT & colonoscpy as above- no acute problems. ~  5/11 & 7/11 >> persist complaints w/ neg recheck by DrStark & DrKimbrough... ~  12/11:  routine GYN surveillance DrFontaine  found anteverted uterus w/ fibroids, atrophic ovaries bilat, no mass. ~  Repeat GI eval 4/12 by DrStark> he tried  to reassure the pt, felt some symptoms might be from poor abd wall muscle tone, rec continue Omep, laxatives... ~  6/12:  She sought another GI opinion DrEdwards> he did not feel she needed any additional testing... ~  She tells me she has yet another appt to see a gastroenterologist at [facility]==> seen by DrThorne 8/12 & note reviewed... ~  EGD by DrThorne 9/12 w/ mult whitish nodular plaques in cardia & fundus> Bx results pending & she is very concerned (remember her hx cancer phobia). ~  She continues to f/[facility]/ [facility] at [facility] & is happy w/ her eval... ~  Now back w/ DrStark at [place]...  RENAL CYST (ICD-593.2) - she has a 1 cm right renal cyst seen on CT in 2007 and followed by DrKimbrough... f/u CT Abd 1/09 - no change in the cyst... f/u renal ultrasound 1/10 w/ small bilat cysts... f/u CT Abd 1/11- no change in cyst. ~  she saw DrKimbrough 7/11 for LBP, microhematuria, urge incontinence- stable, no additional Rx. ~  she saw [place] 1/12 for urge incontinence, freq, nocturia> rec Desmopressin 0.86m- 1/2 tab Qhs.  CARCINOMA, BREAST, LEFT (ICD-174.9) - Surgery 9/94 by DrBlievernicht w/ Left modified radical mastectomy... +estrogen receptors and Rx w/ Tamoxifen... f/u mammograms all neg... breast exam on right has been neg- no nodules palpated... She says that DrBertrand insisted that her PCP check her right breast at each & every office visit to be sure she doesn't develop any nodules...  DEGENERATIVE JOINT DISEASE (ICD-715.90) - on OTC meds + VICODIN Prn... she has end-stage OA of Right Knee per DrWhitfield treated w/ shots in the past... she has a knee brace and a cane for ambulation... ~  8/13:  She wants a better pain pill & we discussed change to NClark Forkprn... ~  12/13: she tells me that DrWhitfield injected her knee last wk- improved...  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 Rx w/ rest, heat, Percocet, and f/u w/ Ortho...  Hx of LOW BACK PAIN, CHRONIC (ICD-724.2) - eval DrWhitfield w/ rec for shots by DrNewton & she's feeling sl better & wears back brace... ~  5/10: mult somatic complaints- primarily c/o left side/ postero-lat rib pain...  Exam w/ tender left lat/ post ribs... Bone Scan= neg... ~  MRI Lumbar spine 7/10 by DrWhitfield... ~  Epidural steroid shot by DrNewton 8/10 & 1/11 ~  Repeat LBP eval by DrWhitfield w/ another MRI lumbar spine showing degenerative changes w/ mild progression since 7/10 MRI, mild-mod sp stenosis & lat recess stenosis at several levels ~  [facility]Whit[facility] but she is not so inclined... ~  6/13: she saw DrTooke w/ lumbar spondylosis; he felt that he had nothing to offer her & rec incr Tramadol to Qid... ~  2014:  She has had numerous visits w/ DrWhitfield/ NErnestina Patchesfor injections, XRays, scans etc...  CARPAL TUNNEL SYNDROME, BILATERAL (ICD-354.0) - she had prev right carpal tunnel release by DrWhitfield and was c/o increasing discomfort in her left wrist despite wrist splint Qhs- had left carpal tunnel release 1/10 by DrWhitfield...  ANXIETY (ICD-300.00) - on ALPRAZOLAM 0.547mTid & encouraged to take it regularly... she has a cancer phobia & it is very difficult to reassure her regarding her symptoms and our evaluations... ~  7/13: she went to the ER c/o swelling of the tongue but eval by EDP indicated that  her tongue was normal & not swollen, she was not on an ACE, given Pred Rx anyway. ~  She has been asked to incr the ALprazolam to regular dosing but she is reluctant...   Past Surgical History  Procedure Laterality Date  . Left mastectomy    . Carpal tunnel release      Outpatient Encounter Prescriptions as of 12/07/2013  Medication Sig  . ALPRAZolam (XANAX) 0.5 MG tablet Take 1/2 to 1 tablet  by mouth three times daily as needed  . aspirin EC 81 MG tablet Take 81 mg by mouth daily.  . carvedilol (COREG) 3.125 MG tablet TAKE ONE TABLET TWICE DAILY WITH A MEAL  . Cholecalciferol (VITAMIN D3) 1000 UNITS CAPS Take 1 capsule by mouth daily. For your bones  . furosemide (LASIX) 40 MG tablet Take 1 tablet (40 mg total) by mouth daily. For swelling  . guaiFENesin (MUCINEX) 600 MG 12 hr tablet Take 1,200 mg by mouth daily. For congestion  . Multiple Vitamins-Minerals (MULTIVITAMINS THER. W/MINERALS) TABS Take 1 tablet by mouth daily.  Marland Kitchen omeprazole (PRILOSEC) 20 MG capsule Take 1 by mouth 30 min before the first meal of the day  . polyethylene glycol powder (GLYCOLAX) powder Take 17 g by mouth 2 (two) times daily.  . sennosides-docusate sodium (SENOKOT-S) 8.6-50 MG tablet Take 2 tablets by mouth daily as needed. For constipation  . traMADol (ULTRAM) 50 MG tablet Take 1 tablet (50 mg total) by mouth every 6 (six) hours as needed for pain. As needed for pain  . [DISCONTINUED] amLODipine (NORVASC) 2.5 MG tablet TAKE ONE TABLET BY MOUTH ONCE DAILY  . azithromycin (ZITHROMAX) 250 MG tablet Take as directed    Allergies  Allergen Reactions  . Fentanyl     REACTION: nausea    Current Medications, Allergies, Past Medical History, Past Surgical History, Family History, and Social History were reviewed in [employer] record.    Review of Systems      See HPI - all other systems neg except as noted... She has mult somatic complaints and anxiety. The patient complains of decreased hearing, chest discomfort, dyspnea on exertion, abdominal pain, incontinence, muscle weakness, and difficulty walking.  The patient denies anorexia, fever, vision loss, hoarseness, syncope, peripheral edema, prolonged cough, headaches, hemoptysis, melena, hematochezia, severe indigestion/heartburn, hematuria, transient blindness, depression, abnormal bleeding, enlarged lymph nodes, and angioedema.      Objective:   Physical Exam   WD, WN, 80 y/o BF in NAD... she is very anxious... GENERAL:  Alert & oriented; pleasant & cooperative... HEENT:  [place]/AT, EOM-wnl, PERRLA, EACs-clear, TMs-wnl, NOSE-clear, THROAT-clear & wnl, no lesions seen. NECK:  Neck ROM decreased, no JVD; normal carotid impulses w/o bruits; no thyromegaly or nodules palpated; no lymphadenopathy. CHEST:  Clear to P & A; without wheezes/ rales/ or rhonchi heard... + tender right lat 11th & 12th ribs on palp. BREAST:  s/p left mastectomy... right breast normal- without nodules palpated... HEART:  Regular Rhythm; without murmurs/ rubs/ or gallops detected... ABDOMEN:  Soft & nontender; normal bowel sounds; no organomegaly or masses palpated... EXT: without deformities, mod arthritic changes; no varicose veins/ +venous insuffic/ tr edema. NEURO:  CN's intact; no focal neuro deficits... DERM:  Small sl excoriated area of dermatitis left shin- no drainage,no rash, etc...  RADIOLOGY DATA:  Reviewed in the EPIC EMR & discussed w/ the patient...  LABORATORY DATA:  Reviewed in the EPIC EMR & discussed w/ the patient...   Assessment & Plan:    RESPIRATORY  SYMPTOMS w/ Dyspnea & throat clearing>  She had a neg pulm eval 2011 and numerous times thereafter; she insisted on additional eval 9/13 for her chr throat clearing symptom; CXR w/o acute changes & PFTs normal for her 79 y/o lungs; she is quite insistent on med rx for her problem & we reviewed Mucinex, Hycodan, Dulera, & Listerine (she was intol to the [facility]); pt very anxious & Alprazolam seems to help when she takes it...  HBP>  Controlled on low doses of several meds including Coreg, Norvasc & Lasix;  She is encouraged to take meds regularly...  Hx CP> evaluated for Cards by DrBensimon & he has not been in favor of invasive testing; stable on conservative med rx...  VI/ Edema>  She is concerned about incr edema & we reviewed 2gm sodium diet & rec keep Lasix at  38m/d...  GI> HH, Gastritis, Divertics, vague abd pain>  I tried once again to reassure her about the thorough GI evaluations that her has undergone; she is happy w/ DrThorne's eval & will f/u w/ her & in the meanwhile continue rx w/ [id number], Miralax, Metamucil, Senakot, Mylicon, etc... She has re-established w/ [facility]- their notes reviewed...  Renal cyst>  This is benign & has been followed by DrKimbrough...  Hx left Breast Cancer>  No known recurrence...  DJD> Cervical spondylosis & chronic LBP>  She has been evaluated by DrWhitfield for Ortho & given epid steroid shots by DrNewton... She wants a better pain pill & [facility] as needed but she prefers the Tramadol; knee shots from DrWhitfield helped...  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  Anxiety>  Asked once again to increase the Alprazolam to 1/2 - 1 tab TID regularly...   Patient's Medications  New Prescriptions   AZITHROMYCIN (ZITHROMAX) 250 MG TABLET    Take as directed  Previous Medications   ALPRAZOLAM (XANAX) 0.5 MG TABLET    Take 1/2 to 1 tablet by mouth three times daily as needed   ASPIRIN EC 81 MG TABLET    Take 81 mg by mouth daily.   CARVEDILOL (COREG) 3.125 MG TABLET    TAKE ONE TABLET TWICE DAILY WITH A MEAL   CHOLECALCIFEROL (VITAMIN D3) 1000 UNITS CAPS    Take 1 capsule by mouth daily. For your bones   FUROSEMIDE (LASIX) 40 MG TABLET    Take 1 tablet (40 mg total) by mouth daily. For swelling   GUAIFENESIN (MUCINEX) 600 MG 12 HR TABLET    Take 1,200 mg by mouth daily. For congestion   MULTIPLE VITAMINS-MINERALS (MULTIVITAMINS THER. W/MINERALS) TABS    Take 1 tablet by mouth daily.   OMEPRAZOLE (PRILOSEC) 20 MG CAPSULE    Take 1 by mouth 30 min before the first meal of the day   POLYETHYLENE GLYCOL POWDER (GLYCOLAX) POWDER    Take 17 g by mouth 2 (two) times daily.   SENNOSIDES-DOCUSATE SODIUM (SENOKOT-S) 8.6-50 MG TABLET    Take 2 tablets  by mouth daily as needed. For constipation   TRAMADOL (ULTRAM) 50 MG TABLET    Take 1 tablet (50 mg total) by mouth every 6 (six) hours as needed for pain. As needed for pain  Modified Medications   Modified Medication Previous Medication   AMLODIPINE (NORVASC) 2.5 MG TABLET amLODipine (NORVASC) 2.5 MG tablet      TAKE ONE TABLET BY MOUTH ONCE DAILY    TAKE ONE TABLET  BY MOUTH ONCE DAILY  Discontinued Medications   No medications on file

## 2014-01-19 ENCOUNTER — Telehealth: Payer: Self-pay | Admitting: Pulmonary Disease

## 2014-01-19 DIAGNOSIS — I519 Heart disease, unspecified: Secondary | ICD-10-CM

## 2014-01-19 DIAGNOSIS — I10 Essential (primary) hypertension: Secondary | ICD-10-CM

## 2014-01-19 DIAGNOSIS — K219 Gastro-esophageal reflux disease without esophagitis: Secondary | ICD-10-CM

## 2014-01-19 DIAGNOSIS — I872 Venous insufficiency (chronic) (peripheral): Secondary | ICD-10-CM

## 2014-01-19 NOTE — Telephone Encounter (Signed)
Pt is aware of SN's recs. Nothing further is needed.

## 2014-01-19 NOTE — Telephone Encounter (Signed)
Per SN--  Only thing that SN will know to use is the   mucinex 600 mg  2 po bid    600 mg Increase fluids  Recs for referral to primary care to be seen by Dr. Jenny Reichmann.  thanks

## 2014-01-19 NOTE — Telephone Encounter (Signed)
Spoke with pt. Saw Dr. Arlana Pouch several times about the phlegm that she has been getting up. He advised that it wasn't coming from her throat but from her stomach. Told her to call her PCP. Pt does see Dr. Fuller Plan in GI. Advised her that she needed to call them and see them again. The last time she was there she was told by their NP that there wasn't any reason for them to see her again. Pt is frustrated and doesn't know where to go. Let her know that she will also need to see a PCP as of April 1. She would like to be referred to primary care on the first floor. Order will be placed for this. Wants SN's recs as what to do about the phlegm she is producing.  Allergies  Allergen Reactions  . Fentanyl     REACTION: nausea   SN - please advise. Thanks.

## 2014-01-25 ENCOUNTER — Other Ambulatory Visit: Payer: Self-pay | Admitting: Pulmonary Disease

## 2014-02-01 ENCOUNTER — Ambulatory Visit: Payer: Medicare Other | Admitting: Internal Medicine

## 2014-02-02 ENCOUNTER — Other Ambulatory Visit (INDEPENDENT_AMBULATORY_CARE_PROVIDER_SITE_OTHER): Payer: Medicare Other

## 2014-02-02 ENCOUNTER — Ambulatory Visit (INDEPENDENT_AMBULATORY_CARE_PROVIDER_SITE_OTHER): Payer: Medicare Other | Admitting: Internal Medicine

## 2014-02-02 ENCOUNTER — Encounter: Payer: Self-pay | Admitting: Internal Medicine

## 2014-02-02 VITALS — BP 120/60 | HR 78 | Temp 97.7°F | Wt 142.0 lb

## 2014-02-02 DIAGNOSIS — K219 Gastro-esophageal reflux disease without esophagitis: Secondary | ICD-10-CM

## 2014-02-02 DIAGNOSIS — Z0000 Encounter for general adult medical examination without abnormal findings: Secondary | ICD-10-CM

## 2014-02-02 DIAGNOSIS — I10 Essential (primary) hypertension: Secondary | ICD-10-CM

## 2014-02-02 LAB — BASIC METABOLIC PANEL
BUN: 16 mg/dL (ref 6–23)
CHLORIDE: 104 meq/L (ref 96–112)
CO2: 31 mEq/L (ref 19–32)
Calcium: 9 mg/dL (ref 8.4–10.5)
Creatinine, Ser: 0.9 mg/dL (ref 0.4–1.2)
GFR: 77.01 mL/min (ref >60.00)
GLUCOSE: 99 mg/dL (ref 70–99)
POTASSIUM: 4.2 meq/L (ref 3.5–5.1)
SODIUM: 142 meq/L (ref 135–145)

## 2014-02-02 LAB — URINALYSIS, ROUTINE W REFLEX MICROSCOPIC
BILIRUBIN URINE: NEGATIVE
KETONES UR: NEGATIVE
LEUKOCYTES UA: NEGATIVE
Nitrite: NEGATIVE
Specific Gravity, Urine: 1.01 (ref 1.000–1.030)
Total Protein, Urine: NEGATIVE
Urine Glucose: 100 — AB
Urobilinogen, UA: 1 (ref 0.0–1.0)
pH: 6.5 (ref 5.0–8.0)

## 2014-02-02 LAB — CBC WITH DIFFERENTIAL/PLATELET
Basophils Absolute: 0 10*3/uL (ref 0.0–0.1)
Basophils Relative: 0.5 % (ref 0.0–3.0)
Eosinophils Absolute: 0.1 10*3/uL (ref 0.0–0.7)
Eosinophils Relative: 1.1 % (ref 0.0–5.0)
HEMATOCRIT: 42 % (ref 36.0–46.0)
Hemoglobin: 13.8 g/dL (ref 12.0–15.0)
LYMPHS ABS: 2.3 10*3/uL (ref 0.7–4.0)
Lymphocytes Relative: 36 % (ref 12.0–46.0)
MCHC: 32.8 g/dL (ref 30.0–36.0)
MCV: 94.6 fl (ref 78.0–100.0)
MONO ABS: 0.6 10*3/uL (ref 0.1–1.0)
Monocytes Relative: 9 % (ref 3.0–12.0)
NEUTROS PCT: 53.4 % (ref 43.0–77.0)
Neutro Abs: 3.4 10*3/uL (ref 1.4–7.7)
PLATELETS: 203 10*3/uL (ref 150.0–400.0)
RBC: 4.44 Mil/uL (ref 3.87–5.11)
RDW: 14.5 % (ref 11.5–14.6)
WBC: 6.3 10*3/uL (ref 4.5–10.5)

## 2014-02-02 LAB — HEPATIC FUNCTION PANEL
ALBUMIN: 4.1 g/dL (ref 3.5–5.2)
ALT: 22 U/L (ref 0–35)
AST: 25 U/L (ref 0–37)
Alkaline Phosphatase: 38 U/L — ABNORMAL LOW (ref 39–117)
Bilirubin, Direct: 0.1 mg/dL (ref 0.0–0.3)
Total Bilirubin: 1.1 mg/dL (ref 0.3–1.2)
Total Protein: 6.8 g/dL (ref 6.0–8.3)

## 2014-02-02 LAB — TSH: TSH: 1.9 u[IU]/mL (ref 0.35–5.50)

## 2014-02-02 LAB — LIPID PANEL
CHOLESTEROL: 199 mg/dL (ref 0–200)
HDL: 98.7 mg/dL (ref >39.00)
LDL Cholesterol: 96 mg/dL (ref 0–99)
Total CHOL/HDL Ratio: 2
Triglycerides: 20 mg/dL (ref 0.0–149.0)
VLDL: 4 mg/dL (ref 0.0–40.0)

## 2014-02-02 MED ORDER — OMEPRAZOLE 20 MG PO CPDR: DELAYED_RELEASE_CAPSULE | ORAL | Status: DC

## 2014-02-02 MED ORDER — CARVEDILOL 3.125 MG PO TABS: 3.1250 mg | ORAL_TABLET | Freq: Two times a day (BID) | ORAL | Status: DC

## 2014-02-02 MED ORDER — PANTOPRAZOLE SODIUM 40 MG PO TBEC: 40.0000 mg | DELAYED_RELEASE_TABLET | Freq: Every day | ORAL | Status: DC

## 2014-02-02 MED ORDER — AMLODIPINE BESYLATE 2.5 MG PO TABS: 2.5000 mg | ORAL_TABLET | Freq: Every day | ORAL | Status: DC

## 2014-02-02 MED ORDER — FUROSEMIDE 40 MG PO TABS: 40.0000 mg | ORAL_TABLET | Freq: Every day | ORAL | Status: DC

## 2014-02-02 NOTE — Progress Notes (Signed)
Subjective:    Patient ID: Heather [name], female    DOB: 10/09/1918, 80 y.o.   MRN: [id number]  HPI    Here for wellness and transfer PCP;  Overall doing ok;  Pt denies CP, worsening SOB, DOE, wheezing, orthopnea, PND, worsening LE edema, palpitations, dizziness or syncope.  Pt denies neurological change such as new headache, facial or extremity weakness.  Pt denies polydipsia, polyuria, or low sugar symptoms. Pt states overall good compliance with treatment and medications, good tolerability, and has been trying to follow lower cholesterol diet.  Pt denies worsening depressive symptoms, suicidal ideation or panic. No fever, night sweats, wt loss, loss of appetite, or other constitutional symptoms.  Pt states good ability with ADL's, has low fall risk, home safety reviewed and adequate, no other significant changes in hearing or vision, and only occasionally active with exercise. Walks with cane, no recent falls. Has seen dr Ernesto Rutherford for throat infection now resolved.  Has some breakthrough reflux symptoms depsite the prilosec 20 qd, no dysphaiga but some regurgitation sometimes.  Last EGD approx 3 yrs ago in [facility] wt has been 175 in past, now to 142. Past Medical History  Diagnosis Date  . RBBB (right bundle branch block with left anterior fascicular block)   . Hypertension   . Hiatal hernia   . Gastritis, chronic   . Breast cancer   . Degenerative joint disease   . Anxiety state, unspecified   . Low back pain   . Carpal tunnel syndrome   . Renal cyst   . Diverticula of colon   . Cervical spondylosis   . Diastolic dysfunction   . Venous insufficiency   . Constipation   . Colon polyp 12/19/2009    Transverse-polypoid colorectal mucosa   Past Surgical History  Procedure Laterality Date  . Left mastectomy    . Carpal tunnel release      reports that she has never smoked. She has never used smokeless tobacco. She reports that she does not drink alcohol or use illicit  drugs. family history is negative for Colon cancer. Allergies  Allergen Reactions  . Fentanyl     REACTION: nausea   Current Outpatient Prescriptions on File Prior to Visit  Medication Sig Dispense Refill  . ALPRAZolam (XANAX) 0.5 MG tablet Take 1/2 to 1 tablet by mouth three times daily as needed  90 tablet  5  . aspirin EC 81 MG tablet Take 81 mg by mouth daily.      . Cholecalciferol (VITAMIN D3) 1000 UNITS CAPS Take 1 capsule by mouth daily. For your bones      . guaiFENesin (MUCINEX) 600 MG 12 hr tablet Take 1,200 mg by mouth daily. For congestion      . Multiple Vitamins-Minerals (MULTIVITAMINS THER. W/MINERALS) TABS Take 1 tablet by mouth daily.      . polyethylene glycol powder (GLYCOLAX) powder Take 17 g by mouth 2 (two) times daily.  527 g  1  . sennosides-docusate sodium (SENOKOT-S) 8.6-50 MG tablet Take 2 tablets by mouth daily as needed. For constipation      . traMADol (ULTRAM) 50 MG tablet Take 1 tablet (50 mg total) by mouth every 6 (six) hours as needed for pain. As needed for pain  30 tablet  5   No current facility-administered medications on file prior to visit.   Review of Systems Constitutional: Negative for diaphoresis, activity change, appetite change or unexpected weight change.  HENT: Negative for hearing loss, ear  pain, facial swelling, mouth sores and neck stiffness.   Eyes: Negative for pain, redness and visual disturbance.  Respiratory: Negative for shortness of breath and wheezing.   Cardiovascular: Negative for chest pain and palpitations.  Gastrointestinal: Negative for diarrhea, blood in stool, abdominal distention or other pain Genitourinary: Negative for hematuria, flank pain or change in urine volume.  Musculoskeletal: Negative for myalgias and joint swelling.  Skin: Negative for color change and wound.  Neurological: Negative for syncope and numbness. other than noted Hematological: Negative for adenopathy.  Psychiatric/Behavioral: Negative for  hallucinations, self-injury, decreased concentration and agitation.      Objective:   Physical Exam BP 120/60  Pulse 78  Temp(Src) 97.7 F (36.5 C) (Oral)  Wt 142 lb (64.411 kg)  SpO2 96% VS noted,  Constitutional: Pt is oriented to person, place, and time. Appears well-developed and well-nourished.  Head: Normocephalic and atraumatic.  Right Ear: External ear normal.  Left Ear: External ear normal.  Nose: Nose normal.  Mouth/Throat: Oropharynx is clear and moist.  Eyes: Conjunctivae and EOM are normal. Pupils are equal, round, and reactive to light.  Neck: Normal range of motion. Neck supple. No JVD present. No tracheal deviation present.  Cardiovascular: Normal rate, regular rhythm, normal heart sounds and intact distal pulses.   Pulmonary/Chest: Effort normal and breath sounds normal.  Abdominal: Soft. Bowel sounds are normal. There is no tenderness. No HSM  Musculoskeletal: Normal range of motion. Exhibits no edema.  Lymphadenopathy:  Has no cervical adenopathy.  Neurological: Pt is alert and oriented to person, place, and time. Pt has normal reflexes. No cranial nerve deficit.  Skin: Skin is warm and dry. No rash noted.  Psychiatric:  Has anxious mood and affect. Behavior is normal.     Assessment & Plan:

## 2014-02-02 NOTE — Progress Notes (Signed)
Pre visit review using our clinic review tool, if applicable. No additional management support is needed unless otherwise documented below in the visit note. 

## 2014-02-02 NOTE — Patient Instructions (Addendum)
OK to take 2 of the omeprazole 20 mg per day to use them up  Please take all new medication as prescribed - the generic protonix 40 mg per day  Please continue all other medications as before, and refills have been done if requested. Please have the pharmacy call with any other refills you may need.  Please continue your efforts at being more active, low cholesterol diet You are otherwise up to date with prevention measures today.  Please go to the LAB in the Basement (turn left off the elevator) for the tests to be done today You will be contacted by phone if any changes need to be made immediately.  Otherwise, you will receive a letter about your results with an explanation, but please check with MyChart first.  Please return in 6 months, or sooner if needed

## 2014-02-03 ENCOUNTER — Other Ambulatory Visit: Payer: Self-pay

## 2014-02-03 MED ORDER — ALPRAZOLAM 0.5 MG PO TABS: ORAL_TABLET | ORAL | Status: DC

## 2014-02-03 MED ORDER — TRAMADOL HCL 50 MG PO TABS: 50.0000 mg | ORAL_TABLET | Freq: Four times a day (QID) | ORAL | Status: DC | PRN

## 2014-02-03 NOTE — Telephone Encounter (Signed)
Done hardcopy to robin  

## 2014-02-03 NOTE — Telephone Encounter (Signed)
Faxed hardcopy to [employer].  Did not received hardcopy for Alprazolam

## 2014-02-03 NOTE — Telephone Encounter (Signed)
Faxed hardcopy of Alprazolam to [employer].

## 2014-02-21 ENCOUNTER — Encounter: Payer: Self-pay | Admitting: Internal Medicine

## 2014-02-21 ENCOUNTER — Ambulatory Visit (INDEPENDENT_AMBULATORY_CARE_PROVIDER_SITE_OTHER): Payer: Medicare Other | Admitting: Internal Medicine

## 2014-02-21 VITALS — BP 142/78 | HR 76 | Temp 98.4°F | Wt 142.0 lb

## 2014-02-21 DIAGNOSIS — I10 Essential (primary) hypertension: Secondary | ICD-10-CM

## 2014-02-21 DIAGNOSIS — F411 Generalized anxiety disorder: Secondary | ICD-10-CM

## 2014-02-21 DIAGNOSIS — Z0000 Encounter for general adult medical examination without abnormal findings: Secondary | ICD-10-CM | POA: Insufficient documentation

## 2014-02-21 DIAGNOSIS — K219 Gastro-esophageal reflux disease without esophagitis: Secondary | ICD-10-CM

## 2014-02-21 DIAGNOSIS — J309 Allergic rhinitis, unspecified: Secondary | ICD-10-CM

## 2014-02-21 HISTORY — DX: Allergic rhinitis, unspecified: J30.9

## 2014-02-21 MED ORDER — PANTOPRAZOLE SODIUM 40 MG PO TBEC: 40.0000 mg | DELAYED_RELEASE_TABLET | Freq: Two times a day (BID) | ORAL | Status: DC

## 2014-02-21 NOTE — Assessment & Plan Note (Signed)

## 2014-02-21 NOTE — Progress Notes (Signed)
Subjective:    Patient ID: Heather [name], female    DOB: 08-05-1918, 80 y.o.   MRN: [id number]  HPI  Here to f/u, c/o multiple somatic complaints, primarily left sided abd pain despite egc/colon 2012 neg and jan 2014 ct abd/pelvis and 2 GI exams per NP's.  She is dissapointed she was not able to be examined per pt by Dr Fuller Plan and wants referral again.  Mentions mult complaints including regurgitation, ongoing sinus congestion and "spitup" despite mucines, and seems overal just miserable, laments mult times of not having been told of what she is convinced is some kind of serious health problem, and how many times she has been attempted to be reassured and related her symptoms overall to aging, which she is convinced for some reason is not true.   States she has Wt loss about 40 lbs in the past yr, chart documents wt 152 in aug 2014, now 142. Marland Kitchen Also some mild allergies. Past Medical History  Diagnosis Date  . RBBB (right bundle branch block with left anterior fascicular block)   . Hypertension   . Hiatal hernia   . Gastritis, chronic   . Breast cancer   . Degenerative joint disease   . Anxiety state, unspecified   . Low back pain   . Carpal tunnel syndrome   . Renal cyst   . Diverticula of colon   . Cervical spondylosis   . Diastolic dysfunction   . Venous insufficiency   . Constipation   . Colon polyp 12/19/2009    Transverse-polypoid colorectal mucosa   Past Surgical History  Procedure Laterality Date  . Left mastectomy    . Carpal tunnel release      reports that she has never smoked. She has never used smokeless tobacco. She reports that she does not drink alcohol or use illicit drugs. family history is negative for Colon cancer. Allergies  Allergen Reactions  . Fentanyl     REACTION: nausea   Current Outpatient Prescriptions on File Prior to Visit  Medication Sig Dispense Refill  . amLODipine (NORVASC) 2.5 MG tablet Take 1 tablet (2.5 mg total) by mouth daily.  90  tablet  3  . aspirin EC 81 MG tablet Take 81 mg by mouth daily.      . carvedilol (COREG) 3.125 MG tablet Take 1 tablet (3.125 mg total) by mouth 2 (two) times daily.  180 tablet  3  . Cholecalciferol (VITAMIN D3) 1000 UNITS CAPS Take 1 capsule by mouth daily. For your bones      . furosemide (LASIX) 40 MG tablet Take 1 tablet (40 mg total) by mouth daily. For swelling  90 tablet  3  . Multiple Vitamins-Minerals (MULTIVITAMINS THER. W/MINERALS) TABS Take 1 tablet by mouth daily.      . polyethylene glycol powder (GLYCOLAX) powder Take 17 g by mouth 2 (two) times daily.  527 g  1  . sennosides-docusate sodium (SENOKOT-S) 8.6-50 MG tablet Take 2 tablets by mouth daily as needed. For constipation      . ALPRAZolam (XANAX) 0.5 MG tablet Take 1/2 to 1 tablet by mouth three times daily as needed  90 tablet  5  . traMADol (ULTRAM) 50 MG tablet Take 1 tablet (50 mg total) by mouth every 6 (six) hours as needed. As needed for pain  30 tablet  5   No current facility-administered medications on file prior to visit.   Review of Systems  Constitutional: Negative for unexpected weight change, or unusual  diaphoresis  HENT: Negative for tinnitus.   Eyes: Negative for photophobia and visual disturbance.  Respiratory: Negative for choking and stridor.   Gastrointestinal: Negative for vomiting and blood in stool.  Genitourinary: Negative for hematuria and decreased urine volume.  Musculoskeletal: Negative for acute joint swelling Skin: Negative for color change and wound.  Neurological: Negative for tremors and numbness other than noted  Psychiatric/Behavioral: Negative for decreased concentration or  hyperactivity.       Objective:   Physical Exam BP 142/78  Pulse 76  Temp(Src) 98.4 F (36.9 C) (Oral)  Wt 142 lb (64.411 kg)  SpO2 95% VS noted,  Constitutional: Pt appears well-developed and well-nourished.  HENT: Head: NCAT.  Right Ear: External ear normal.  Left Ear: External ear normal.  Bilat  tm's with mild erythema.  Max sinus areas nontender.  Pharynx with mild erythema, no exudate Eyes: Conjunctivae and EOM are normal. Pupils are equal, round, and reactive to light.  Neck: Normal range of motion. Neck supple.  Cardiovascular: Normal rate and regular rhythm.   Pulmonary/Chest: Effort normal and breath sounds normal.  Abd:  Soft, NT, non-distended, + BS Neurological: Pt is alert. Not confused  Skin: Skin is warm. No erythema.  Psychiatric:  Thought content normal , 2+ nerovus    Assessment & Plan:

## 2014-02-21 NOTE — Progress Notes (Signed)
Pre visit review using our clinic review tool, if applicable. No additional management support is needed unless otherwise documented below in the visit note. 

## 2014-02-21 NOTE — Assessment & Plan Note (Signed)
stable overall by history and exam, recent data reviewed with pt, and pt to continue medical treatment as before,  to f/u any worsening symptoms or concerns BP Readings from Last 3 Encounters:  02/21/14 142/78  02/02/14 120/60  12/07/13 96/60

## 2014-02-21 NOTE — Assessment & Plan Note (Signed)
Ok to change PPI to protonix 40 qd,  to f/u any worsening symptoms or concerns

## 2014-02-21 NOTE — Assessment & Plan Note (Signed)
For claritin otc prn

## 2014-02-21 NOTE — Assessment & Plan Note (Signed)
With some regurg recently, d/w pt, ok to incr the protonix 40 bid, but I dont think needs repeat eval with GI at this time

## 2014-02-21 NOTE — Patient Instructions (Signed)
OK to try to increase the protonix to 40 mg twice per day  You can also try OTC claritin for the allergies  Please continue all other medications as before, and refills have been done if requested. Please have the pharmacy call with any other refills you may need.  Please continue your efforts at being more active, low cholesterol diet, and weight control.  Please keep your appointments with your specialists as you may have planned

## 2014-02-21 NOTE — Assessment & Plan Note (Addendum)
Severe, reassured again, declines other tx at this time  Note:  Total time for pt hx, exam, review of record with pt in the room, determination of diagnoses and plan for further eval and tx is > 40 min, with over 50% spent in coordination and counseling of patient

## 2014-02-21 NOTE — Addendum Note (Signed)
Addended by: Biagio Borg on: 02/21/2014 09:28 PM   Modules accepted: Level of Service

## 2014-02-23 ENCOUNTER — Other Ambulatory Visit: Payer: Self-pay | Admitting: Pulmonary Disease

## 2014-02-23 NOTE — Telephone Encounter (Signed)
Pt is now established under Dr. Cathlean Cower. Refills need to go through his office.  I called [facility] and made aware.

## 2014-03-07 ENCOUNTER — Ambulatory Visit: Payer: Medicare Other | Admitting: Pulmonary Disease

## 2014-03-09 ENCOUNTER — Ambulatory Visit: Payer: Medicare Other | Admitting: Internal Medicine

## 2014-03-09 DIAGNOSIS — Z0289 Encounter for other administrative examinations: Secondary | ICD-10-CM

## 2014-03-13 ENCOUNTER — Telehealth: Payer: Self-pay | Admitting: Gastroenterology

## 2014-03-13 NOTE — Telephone Encounter (Signed)
Patient is given an appt for 03/27/14 11:00.  She is advised that we will put her on the cancellation list.

## 2014-03-15 ENCOUNTER — Ambulatory Visit (INDEPENDENT_AMBULATORY_CARE_PROVIDER_SITE_OTHER): Payer: Medicare Other | Admitting: Internal Medicine

## 2014-03-15 ENCOUNTER — Encounter: Payer: Self-pay | Admitting: Internal Medicine

## 2014-03-15 VITALS — BP 140/62 | HR 80 | Temp 98.7°F | Wt 145.2 lb

## 2014-03-15 DIAGNOSIS — J069 Acute upper respiratory infection, unspecified: Secondary | ICD-10-CM | POA: Insufficient documentation

## 2014-03-15 DIAGNOSIS — J309 Allergic rhinitis, unspecified: Secondary | ICD-10-CM

## 2014-03-15 DIAGNOSIS — I10 Essential (primary) hypertension: Secondary | ICD-10-CM

## 2014-03-15 MED ORDER — AZITHROMYCIN 250 MG PO TABS: ORAL_TABLET | ORAL | Status: DC

## 2014-03-15 MED ORDER — METHYLPREDNISOLONE ACETATE 80 MG/ML IJ SUSP
80.0000 mg | Freq: Once | INTRAMUSCULAR | Status: AC
  Administered 2014-03-15: 80 mg via INTRAMUSCULAR

## 2014-03-15 NOTE — Progress Notes (Signed)
Subjective:    Patient ID: Heather [name], female    DOB: 1918-09-25, 80 y.o.   MRN: [id number]  HPI  Here ostensibly for her chronic LLQ pain without change but just not better, but primarily also for URi symptoms with mild fever, ST, HA and congestion for 1 wk.  Pt denies chest pain, increased sob or doe, wheezing, orthopnea, PND, increased LE swelling, palpitations, dizziness or syncope.   Pt denies polydipsia, polyuria.  Has numerous other complaints that I was not able to get her to elaborate or define well, quite nervous today, I think mostly having to do with myalgias possibly related to her acute illness.  Denies worsening reflux, abd pain, dysphagia, n/v, bowel change or blood, except for the chronic LLQ pain that makes her miserable.  Recent CT neg for acute. Does have several wks ongoing nasal allergy symptoms with clearish congestion, itch and sneezing, without fever, pain, ST, cough, swelling or wheezing.  Past Medical History  Diagnosis Date  . RBBB (right bundle branch block with left anterior fascicular block)   . Hypertension   . Hiatal hernia   . Gastritis, chronic   . Breast cancer   . Degenerative joint disease   . Anxiety state, unspecified   . Low back pain   . Carpal tunnel syndrome   . Renal cyst   . Diverticula of colon   . Cervical spondylosis   . Diastolic dysfunction   . Venous insufficiency   . Constipation   . Colon polyp 12/19/2009    Transverse-polypoid colorectal mucosa  . Allergic rhinitis, cause unspecified 02/21/2014   Past Surgical History  Procedure Laterality Date  . Left mastectomy    . Carpal tunnel release      reports that she has never smoked. She has never used smokeless tobacco. She reports that she does not drink alcohol or use illicit drugs. family history is negative for Colon cancer. Allergies  Allergen Reactions  . Fentanyl     REACTION: nausea   Current Outpatient Prescriptions on File Prior to Visit  Medication Sig  Dispense Refill  . ALPRAZolam (XANAX) 0.5 MG tablet Take 1/2 to 1 tablet by mouth three times daily as needed  90 tablet  5  . amLODipine (NORVASC) 2.5 MG tablet Take 1 tablet (2.5 mg total) by mouth daily.  90 tablet  3  . aspirin EC 81 MG tablet Take 81 mg by mouth daily.      . carvedilol (COREG) 3.125 MG tablet Take 1 tablet (3.125 mg total) by mouth 2 (two) times daily.  180 tablet  3  . Cholecalciferol (VITAMIN D3) 1000 UNITS CAPS Take 1 capsule by mouth daily. For your bones      . furosemide (LASIX) 40 MG tablet Take 1 tablet (40 mg total) by mouth daily. For swelling  90 tablet  3  . Multiple Vitamins-Minerals (MULTIVITAMINS THER. W/MINERALS) TABS Take 1 tablet by mouth daily.      . pantoprazole (PROTONIX) 40 MG tablet Take 1 tablet (40 mg total) by mouth 2 (two) times daily.  60 tablet  11  . polyethylene glycol powder (GLYCOLAX) powder Take 17 g by mouth 2 (two) times daily.  527 g  1  . sennosides-docusate sodium (SENOKOT-S) 8.6-50 MG tablet Take 2 tablets by mouth daily as needed. For constipation      . traMADol (ULTRAM) 50 MG tablet Take 1 tablet (50 mg total) by mouth every 6 (six) hours as needed. As needed for pain  30 tablet  5   No current facility-administered medications on file prior to visit.    Review of Systems  Constitutional: Negative for unusual diaphoresis or other sweats  HENT: Negative for ringing in ear Eyes: Negative for double vision or worsening visual disturbance.  Respiratory: Negative for choking and stridor.   Gastrointestinal: Negative for vomiting or other signifcant bowel change Genitourinary: Negative for hematuria or decreased urine volume.  Musculoskeletal: Negative for other MSK pain or swelling Skin: Negative for color change and worsening wound.  Neurological: Negative for tremors and numbness other than noted  Psychiatric/Behavioral: Negative for decreased concentration or agitation other than above       Objective:   Physical Exam BP  140/62  Pulse 80  Temp(Src) 98.7 F (37.1 C) (Oral)  Wt 145 lb 4 oz (65.885 kg)  SpO2 96% VS noted,  Constitutional: Pt appears well-developed, well-nourished.  HENT: Head: NCAT.  Right Ear: External ear normal.  Left Ear: External ear normal.  Bilat tm's with mild erythema.  Max sinus areas non tender.  Pharynx with mild erythema, no exudate Eyes: . Pupils are equal, round, and reactive to light. Conjunctivae and EOM are normal Neck: Normal range of motion. Neck supple.  Cardiovascular: Normal rate and regular rhythm.   Pulmonary/Chest: Effort normal and breath sounds normal.  Abd:  Soft, NT, ND, + BS Neurological: Pt is alert. Not confused , motor grossly intact Skin: Skin is warm. No rash Psychiatric: Pt behavior is overall normal. No agitation, delusions or hallucinations. 1-2+ nervous    Assessment & Plan:

## 2014-03-15 NOTE — Patient Instructions (Addendum)
You had the steroid shot today for the allergies and congestion  Please take all new medication as prescribed - the antibiotic  You can also take Claritin 10 mg OTC as needed for allergies  Please continue all other medications as before, including the protonix  Please have the pharmacy call with any other refills you may need.

## 2014-03-15 NOTE — Progress Notes (Signed)
Pre visit review using our clinic review tool, if applicable. No additional management support is needed unless otherwise documented below in the visit note. 

## 2014-03-16 NOTE — Assessment & Plan Note (Signed)
Ok for otc claritin prn,  to f/u any worsening symptoms or concerns 

## 2014-03-16 NOTE — Assessment & Plan Note (Signed)
stable overall by history and exam, recent data reviewed with pt, and pt to continue medical treatment as before,  to f/u any worsening symptoms or concerns BP Readings from Last 3 Encounters:  03/15/14 140/62  02/21/14 142/78  02/02/14 120/60

## 2014-03-16 NOTE — Assessment & Plan Note (Signed)
Mild to mod, for antibx course,  to f/u any worsening symptoms or concerns 

## 2014-03-20 ENCOUNTER — Telehealth: Payer: Self-pay | Admitting: Internal Medicine

## 2014-03-20 NOTE — Telephone Encounter (Signed)
Very sorry, but I do not practice chronic pain management in my practice any longer and do not prescribe higher controlled substances; I can refer to Pain Management if this pt wants this

## 2014-03-20 NOTE — Telephone Encounter (Signed)
Tramadol isn't helping her back pain from arthritis.  She needs a refill.  Is there something better?

## 2014-03-21 NOTE — Telephone Encounter (Signed)
Called the patient left a detailed message of MD instructions.

## 2014-03-27 ENCOUNTER — Telehealth: Payer: Self-pay | Admitting: Pulmonary Disease

## 2014-03-27 ENCOUNTER — Ambulatory Visit: Payer: Medicare Other | Admitting: Gastroenterology

## 2014-03-27 NOTE — Telephone Encounter (Signed)
Called spoke with pt. She was trying to call Dr. Gwynn Burly office. She did not mean to call us. Will sign off message

## 2014-03-28 ENCOUNTER — Telehealth: Payer: Self-pay | Admitting: Pulmonary Disease

## 2014-03-28 NOTE — Telephone Encounter (Signed)
Spoke with pt. Aware of recs. Appt scheduled for 04/04/14 and will have CXR prior. Nothing further needed

## 2014-03-28 NOTE — Telephone Encounter (Signed)
Pt asking to be seen for eval of prod cough Reports she had spoken with PCP regarding symptoms and he recommended she establish with pulmonary.  She has [employer] so will not require a referral from her PCP Advised pt we are unsure when SN will start seeing pulmonary patients, but she is requesting to see SN specifically - will forward to SN and Leigh to see when pt can be seen for this issue.   Pt is requesting a call back today  Please advise, thank you.

## 2014-03-28 NOTE — Telephone Encounter (Signed)
Per SN--  Continue the mucinex 600 mg  2 po bid Increase fluids Will need ROV next with SN and have cxr prior to her appt..  thanks

## 2014-03-30 ENCOUNTER — Encounter: Payer: Self-pay | Admitting: Pulmonary Disease

## 2014-04-04 ENCOUNTER — Encounter: Payer: Self-pay | Admitting: Pulmonary Disease

## 2014-04-04 ENCOUNTER — Ambulatory Visit (INDEPENDENT_AMBULATORY_CARE_PROVIDER_SITE_OTHER): Payer: Medicare Other | Admitting: Pulmonary Disease

## 2014-04-04 ENCOUNTER — Ambulatory Visit (INDEPENDENT_AMBULATORY_CARE_PROVIDER_SITE_OTHER)
Admission: RE | Admit: 2014-04-04 | Discharge: 2014-04-04 | Disposition: A | Discharge status: Home / Self Care | Payer: Medicare Other | Source: Ambulatory Visit | Attending: Pulmonary Disease | Admitting: Pulmonary Disease

## 2014-04-04 VITALS — BP 128/60 | HR 66 | Temp 98.2°F | Ht 62.0 in | Wt 140.0 lb

## 2014-04-04 DIAGNOSIS — J42 Unspecified chronic bronchitis: Secondary | ICD-10-CM

## 2014-04-04 DIAGNOSIS — K219 Gastro-esophageal reflux disease without esophagitis: Secondary | ICD-10-CM | POA: Insufficient documentation

## 2014-04-04 DIAGNOSIS — K449 Diaphragmatic hernia without obstruction or gangrene: Secondary | ICD-10-CM

## 2014-04-04 DIAGNOSIS — F411 Generalized anxiety disorder: Secondary | ICD-10-CM

## 2014-04-04 DIAGNOSIS — J387 Other diseases of larynx: Secondary | ICD-10-CM

## 2014-04-04 DIAGNOSIS — F450 Somatization disorder: Secondary | ICD-10-CM

## 2014-04-04 HISTORY — DX: Gastro-esophageal reflux disease without esophagitis: K21.9

## 2014-04-04 NOTE — Patient Instructions (Signed)
Today we updated your med list in our EPIC system...    Continue your current medications the same...  Today we rechecked your CXR which was clear, no acute changes noted...  For your cough, drainage, phlegm & while balls>>    I'd like you to take OTC MUCINEX 600mg  - one tab 4 times daily w/ glass of water...    You may also take 2 tsp of the OTC cough syrup DELSYM at bedtime...  Be sure to continue the antireflux regimen as outlined to you by DrStark & DrJohn  's plan a follow up visit in 3 months.Marland KitchenMarland Kitchen

## 2014-04-04 NOTE — Progress Notes (Signed)
Subjective:    Patient ID: Heather [name], female    DOB: 1918-01-09, 80 y.o.   MRN: [id number]  HPI 80 y/o BF here for a follow up visit... she has multiple medical problems including:  Dyspnea & Anxiety;  HBP;  Hx CP followed by DrBensimhon;  HH/ Gastritis;  Divertics/ Constip;  Functional Abd Pain;  Renal cyst followed by DrKimbrough;  Hx left breast [phone];  DJD/ CSpine spondy/ LBP/ CTS; and she has a cancer phobia...  ~  June 28, 2012:  78moROV & Sawsan continues to have mult somatic complaints & says "terrible, terrible, terrible" states she's lost all the muscles in her abd & now uses a girdle; "all my doctors say for me to see you" eg> DrEdwards, DrNishan, etc; she still believes that she has cancer "somewhere- just like my friend"... Then she launches into a flight of ideas of complaints> carpel tunnel symptoms (she wants to f/u w/ DrWhtfield), cough/ phlegm, dizzy, "it's my stomach", etc...    We reviewed prob list, meds, xrays and labs> see below for updates >>  ~  August 03, 2012:  78moOV & add-on at pt's insistance for "hocking- it's not a cough, it's a hock"; then she proceeds to clear her throat forcefully & "hock-up" some clear phlegm; she notes "thick white foamy slimy sputum w/ flecks of chopped meat in it"; she denies cough/ hemoptysis/ fcs, etc; she notes some mild dyspnea (at baseline) and occas CP from the force of the hocking; she is quite insistent on additional work-up & Rx for this perceived problem:     Exam is clear- no oral lesions, neck is neg, chest is clear...    CXR shows sl overinflation, clear lungs, normal heart size w/ elongation & tortuous Ao...    PFTs are wnl> FVC=2.62 (128%), FEV1=2.20 (158%), %1sec=84; mid-flows=274% of predicted...    Note> she is quite anxious & has a cancer phobia; again asked to take her Alprazolam Tid regularly... We reviewed prob list, meds, xrays and labs> see below for update >> We decided on a trial regimen to help her  "hocking" w/ short term f/u to assess efficacy> MUCINEX 60082m2Bid w/ lots of fluids; HYCODAN syrup- 1tspQid; DULERA100/5- 1puffBid then rinse w/ Listerine... Note> before completing her eval today she insisted in a right breast exam stating that DrBertrand insisted that her PCP examine her breast at every visit> it was neg.  ~  August 30, 2012:  78mo78mo & recheck> "I'm just falling apart, meds not helping, Dulera choked me up, mouth glued together" therefore she stopped the [facility] better on no Rx she says "there is something wrong w/ me, I'm losing so much weight, I'm skin & bones" wt today= 156# which is up 8# from last visit; she is c/o snorting phlegm that she says tastes bitter & has little white balls in it (she could not produce any sput despite a lot of hocking in the office today (advised to continue Mucinex600mg41md w/ fluids)......  Also c/o left lat CWP, tender ribs & wears lumbar brace/ rib binder, +takes Hydrocodone/ Tramadol as needed...     We reviewed prob list, meds, xrays and labs> see below for updates >> she declines the 2013 flu vaccine... LABS 10/13:  Chems-wnl;  CBC- wnl;  TSH= 1.79  ~ September 27, 2012: 78mo R43mo Santiago is rambling on w/ 'flight of ideas" today & due to this we tried a systems approach & touched on all these problems  during her visit today>>     Resp symptoms> c/o SOB, hocking phlegm, "this throat business" for which she has seen DrRosen, ENT; now c/o decr hearing, thick phlegm, etc; We reviewed CXR 10/13- clear & wnl; PFT 10/13- normal airflow; Rec- Mucinex6764m-2Bid, lots of fluids, & f/u w/ ENT for hearing eval...     HBP> on Coreg3.125Bid, Amlod2.5, Lisin40; BP= 120/64 & we reviewed labs from 10/13- all wnl...     HxCP> on ASA81; followed by DrBensimhon/ DCherly Hensenfor Cards & their notes are reviewed=> neg ischemic work up...     VI, Edema> she went to the ER 11/13 w/ edema; VDopplers were neg for DVT; they reiterated the recs for no salt,  elevation, support hose, & continue Lasix40.     GI- HH, Gastirits, Divertics, chronic pain & "swelling"> on Omep20- taking 2/d & we discussed derc to one daily; plus Miralax & Senakot-S...     Renal cyst> she is very anxious about everything & has a cancer phobia; DrKimbrough followed this benign simple cyst for yrs- no change...     Hx Breast cancer> she had left mod radical mastectomy 1994 by DrBlievernicht- no known recurrence & she is given reassurance...     Ortho- DJD, Cspine spongy, LBP, CTS> followed by DrWhitfield; on Vicodin & Tramadol but prefers the latter she says...     Anxiety> on Alpraz0.513mid but "I'm not taking it very often" she says & encouraged to use more regularly...  We reviewed prob list, meds, xrays and labs> see below for updates >>   ~  October 27, 2012:  64m364moV & Heather [name] has mult somatic complaints as usual- dry mouth at night, hocking thick phlegm, c/o stomach/ back/ etc... Again we reviewed the following problems during today's visit>>    Resp symptoms> on Mucinex2Bid +Fluids; she saw DrRosen, ENT 11/13- dysphagia, presbycusis, referral otalgia; exam was normal, barium swallow 11/13 showed diminished primary peristaltic wave, mild tertiary contractions, prominent cricopharyngeus muscle, no hiatal hernia/ no reflux/ barium pill passed into the stomach without delay...    HBP> on Coreg3.125Bid, Amlod2.5, Lisin40; BP= 120/60 & she persists w/ mult somatic complaints...    HxCP> on ASA81; followed by DrBensimhon/ DrNishan for Cards & their notes are reviewed=> neg ischemic work up previously...     VI, Edema> she went to the ER 11/13 w/ edema; VDopplers were neg for DVT; they reiterated the recs for no salt, elevation, support hose, & continue Lasix40.     GI- HH, Gastirits, Divertics, chronic pain & "swelling"> on Omep20m2mplus Miralax & Senakot-S; she states "my stomach has gone to my back".    Renal cyst> she is very anxious about everything & has a cancer phobia;  DrKimbrough followed this benign simple cyst for yrs- no change...     Hx Breast cancer> she had left mod radical mastectomy 1994 by DrBlievernicht- no known recurrence & she is given reassurance...     Ortho- DJD, Cspine spondy, LBP, CTS> followed by DrWhitfield & he injected her right knee 1wk ago; on Vicodin & Tramadol but prefers the latter she says...     Anxiety> on Alpraz0.5mgT84mbut "I'm not taking it very often" she says & encouraged to use more regularly...  We reviewed prob list, meds, xrays and labs> see below for updates >>   ~  December 13, 2012:  6wk RSavoya good holiday but launches into her usual litany of complaints> feels bad in the mornings, legs sore "they get hot", "  I get hot in my back", c/o "hocking up little white balls of phlegm", stools are dark & small, etc... She thinks she needs massage for her back problems & has seen DrWhitfield w/ PT recommended... She is rec to increase Mucinex 693m-2Bid w/ Fluids;  Not currently c/o dyspnea;  BP well controlled on meds;  She has chronic GI complaints managed by DrStark & she had no specific GI complaints today;  She has Vicodin & Tramadol for pain;  She has AOptician, dispensingfor anxiety & rec (again) to take it more regularly...     We reviewed prob list, meds, xrays and labs> see below for updates >>   ~  January 07, 2013:  3-4wk ROV & add-on for multiple somatic complaints> MsHarris insisted on this add-on visit due to mult somatic complaints & when asked what specifically she was complaining about she launched into a flight of ideas all over the place> stiff; worse & worse; phlegm w/ white flecks & she cannot recalll our mult prev discussions about this w/ rec for Mucinex-2Bid & lots of fluids; legs jerking (rec for tonic water at bedtime); "my back is on fire" esp at night (rec to try Gabapentin 1074m1-2 at bedtime); wt loss w/ fair appetite (rec to drink Ensure 2 cans/d betw meals); wants to try massage therapy (I told her ok w/  me)...  ~  March 14, 2013:  32m732moV & Forestine persists w/ mult somatic complaints and seems almost inconsolable regarding her health; recall that she has a cancer phobia & she has been impossible to reassure in this regard no matter how many tests we run or specialists we send her to... Today she is concerned about "losing weight right & left" but our scale says she has gained 2# to 147# & her BMI= 28-29;  She continues to c/o harking phlegm & congestion ("thick lumpy slimy balls of white meat"), states her urine is very yellow in the evening, and her left arm & right leg are swelling... She is also c/o back pain & she has seen DrWhitfield, Ortho 4 times since I saw her last- XRays w/ extensive multilevel DDD in spine, Bone Scan w/o boney mets, "I'm getting therapy"... We discussed checking Sput C&S (NTF only), and increasing her Mucinex600m3m 2Bid + fluids; her recent UA & cult were neg as well...    BP remains well controlled on her 3med25m GI is stable on Prilosec & laxatives, followed by DrJEdwards;  She has Xanax for anxiety but she still won't take it regularly as I havew recommended to her...     We reviewed prob list, meds, xrays and labs> see below for updates >>  NOTE: CT Chest, Abd, Pelvis done 3/14 for additional screening after call from Ortho PA regarding pt's c/o wt loss etc; reports reviewed=> no acute abn identified...   ~  May 30, 2013:  2-65mo R27mo DorethNaliahon to be 89 y/o64stable without new complaints but says "terrible, terrible, terrible" & still mentions the perceived "swelling" in her left side of abd ("it's so big i have to hold it up" & asked to try an abd binder) and again mentions a mild cough productive of "little white balls of mucous"; she states "DrCrossley can't find where the phlegm is coming from, c/o "hocking little white balls that makes me sick";  She [facility] Tramadol...     We reviewed prob list, meds, xrays and labs>  see  below for updates >> She is rec to continue f/u w/ her specialists to get the reassurance she needs to know that everything possible is being done to address her concerns... Asked to take the Alpraz0.10mTid regularly (she never does)...  ~  July 20, 2013:  6wk ROV & nothing has changed for Dorthea- still c/o pain, swelling, etc... She has had follow ups w/GI & Ortho- notes reviewed; she remains very anxious w/ mult somatic complaints and as prev noted has a cancer phobia but is totally impossible to console, reassure, etc... Wonders why she is losing so much weight (weight is up 4# to 152# today) & we reviewed no salt, elevation, support hose, & the Lasix...     We reviewed prob list, meds, xrays and labs> see below for updates >>  she refuses the Flu vaccine... She is asked to ret for Fasting blood work=> she never did...  ~  September 06, 2013:  6wk RCastle Hillsays "I'm sick, so clogged up, I can feel all my guts" (says she can feel her intestines, but has lost her stomach) w/ her usual mult somatic complaints but few physical findings;  We decided to do CXR, Ambulatory O2 check, and non-fasting labs today...     We reviewed prob list, meds, xrays and labs> see below for updates >> She wants handicap sticker- OK... CXR 10/14 showed normal heart size w/ tortuous atherosclerotic Ao, clear lungs w/ ?vague opac left mid zone adjacent to hilum (nothing seen in this area on CT 3/14) ?overlying shadows & we will f/u... Ambulatory O2 check> O2 sat= 98% on room air at rest w/ pulse=83, regular; Ambulated 3 laps w/ lowest O2 sat= 96% w/ pulse=108... LABS 10/14:  Chems- wnl;  CBC- wnl;  TSH=2.04;  VitD=71;  Sed=11...   ~  December 07, 2013:  327moOV & Ramon has no new complaints today; she voiced concern over prev [facility] visit w/ N&V, had IV, ?got infected "they had to operate on my hand", but her CC is the bill from [employer]. She persists w/ GI complaints> gas, N&V, swelling, etc;  Throat symptoms and  she's seen DrCrossley;  LBP and she's followed by DrWhitfield, shots w/o help she says;  Cancer phobia as we've noted in the past but she is unconsolable...     HBP> on Coreg3.125Bid, Amlod2.5, Lisin40; BP= 100/60 & she persists w/ mult somatic complaints...    HxCP> on ASA81; followed by DrBensimhon/ DrNishan for Cards & their prev notes are reviewed=> neg ischemic work up previously...     VI, Edema> she went to the ER 11/13 w/ edema; VDopplers were neg for DVT; they reiterated the recs for no salt, elevation, support hose, & continue Lasix40.     GI- HH, Gastirits, Divertics, chronic pain & "swelling"> on Omep2012m plus Miralax & Senakot-S; she persists w/ mult GI complaints despite [facility], [facility]-DrThorne...    Renal cyst> she is very anxious about everything & has a cancer phobia; DrKimbrough followed this benign simple cyst for yrs- no change...     Hx Breast cancer> she had left mod radical mastectomy 1994 by DrBlievernicht- no known recurrence & she is given reassurance...     Ortho- DJD, Cspine spondy, LBP, CTS> followed by DrWhitfield & he injected her right knee & her back; on Vicodin & Tramadol but prefers the latter she says...     Anxiety> on Alpraz0.5mg27m but "I'm not taking it very often" she says &  encouraged to use more regularly...  We reviewed prob list, meds, xrays and labs> see below for updates >> she wants a ZPak for the drainage in her throat...  ~  Apr 04, 2014:  63moROV & Elaina has established w/ DrJohn for Primary care and has seen him x3 already... She still sees DrCrosslry for ENT and DrStark for GI>>    From the Pulmonary standpoint she is c/o similar symptoms to prev- cough, harking up thick balls of white phlegm "it's so bad- big lumps, it's so slimy that it just jumps out, it's unbearable"; she notes some mild SOB/DOE but not progressive & by all accounts she gets along remarkably well for 80y/o; she denies f/c/s but notes "I get hot"; she  denies CP, palpit, dizzy, edema; prev asked to take Mucinex but she wouldn't do it because DrCrossley told her not to for some reason but she is no better on his regimen...    BASELINE DATA>>  Baseline CXRs showed sl overinflation, clear lungs, normal heart size w/ elongation & tortuous Ao... Prev CT Chest/ CTA 3/14 showed no PE, no acute findings, +atherosclerotic changes, several scattered tiny nodules 3-428msize & no change from 2009 scan, s/p left mastectomy, DJD Tspine...  PFTs 9/13 were WNL> FVC=2.62 (128%), FEV1=2.20 (158%), %1sec=84; mid-flows=274% of predicted  CXR 04/04/14 shows stable heart size, atherosclerotic & tortuous Ao, mild hyperinflation, clear lungs w/o acute changes, DJD Tspine...  PLAN:  She is asked to start MUCINEX 60020mne tab Qid w/ plenty of water; and DELSYM 2 tsp po Qhs...  I tried to give her maximum reassurance & we will recheck her in 3 months...           Problem List:  RESPIRATORY SYMPTOMS >> c/o "hocking phlegm" DYSPNEA (ICD-786.9) - eval 5/11 c/o SOB- can't get a DB, can't get enough air in, etc... occurs at rest & w/ exerc "I'm very active despite my arthritis", talks rapidly in long sentences w/o apparent distress... no cough, phlegm, hemoptysis, etc- but as usual she is quite distressed by these symptoms and wants further eval> we discussed re-assessment w/ CXR (clear, NAD);  PFT (totally norm airflow);  Labs (all WNL);  Rx w/ ALPRAZOLAM 0.5mg79md & encouraged to take regularly. ~  CXR 4/13 showed norm heart size, clear lungs, mild TSpine spondylosis... ~  She continues to complain of drainage, throat symptoms, etc; Rx w/ Mucinex, OTC antihist, Nasonex, etc;  Seen by ENT, DrRosen w/ ?xerostomia & rec incr water etc. ~  9/13: Add-on visit for "hocking, it's not a cough, it's a hock"> see above... ~  9/13: CXR shows sl overinflation, clear lungs, normal heart size w/ elongation & tortuous Ao;  PFTs are wnl> FVC=2.62 (128%), FEV1=2.20 (158%), %1sec=84;  mid-flows=274% of predicted... We discussed trial rx for her symptoms- Mucinex, Fluids, Hycodan, Dulera100- 1puffbid. ~  10/13: [facility] & mouth symptoms "like glue" therefore stopped this med; advised to continue the others. ~  3/14:  CT Chest (done after call from Ortho PA regarding her symptoms)=> then CTA (done via an ER visit) showed no PE, no acute findings, +atherosclerotic changes, several scattered tiny nodules 3-4mm 24me & no change from 2009 scan, s/p left mastectomy, DJD Tspine...  ~  4/14: she is asked to increase the Mucinex600mg 67mBid +[facility] water daily... ~  10/14: CXR showed normal heart size w/ tortuous atherosclerotic Ao, clear lungs w/ ?vague opac left mid zone adjacent to hilum (  nothing seen in this area on CT 3/14) ?overlying shadows & we will f/u...  ~  CXR 04/04/14 shows stable heart size, atherosclerotic & tortuous Ao, mild hyperinflation, clear lungs w/o acute changes, DJD Tspine...   HYPERTENSION (ICD-401.9) - on AMLODIPINE 2.73m/d,  COREG 3.1272mid,  LASIX 4078mtaking 1/2tab/d because 1 tab makes her too weak...  ~  5/15:  BP= 128/60 on AMLODIPINE2.5, COREG3.125Bid, LASIX40...  Hx of CHEST PAIN (ICD-786.50) - she takes ASA 50m46m+ Tylenol Prn... followed by DrBensimhon for Cards. ~  Baseline EKG w/ RBBB & LAD, no acute changes... ~  NuclearStressTest done 2007 & 2009 showed normal- without scar or ischemia, EF=67%...   ~  2DEcho 7/08 showed mild AI, mild diastolic dysfunction, EF=5MP=53-61% CT Abd 1/09 & 9/12 showed incidental coronary calcif, & aorto-iliac atherosclerotic changes as well... ~  8/13: she saw DrNishan> HBP, DD, atypCP w/ neg Myoviews & no changes made... ~  10/13:  She uses a back brace/ abd binder to help her CWP...  VENOUS INSUFFICIENCY (ICD-459.81) - she has mod VI and follows a low sodium diet, elevates legs when able, and wears support hose. ~  7/12:  C/o incr edema & we reviewed a 2gm sodium diet & increased  her Lasix from 20mg17mo 40mg/73m ~  5/13:  She has persist complaints but mild chr VI w/ min edema & reminded to elim sodium, elev, support hose, take the Lasix... ~ 11/13: she went to the ER w/ edema; VDopplers were neg for DVT; they reiterated the recs for no salt, elevation, support hose, & continue Lasix40.  HIATAL HERNIA (ICD-553.3) & GASTRITIS (ICD-535.50) - on OMEPRAZOLE 20mg/d55mollowed by DrJEdwards for GI having fired DrStark & [facility] in past. ~  last EGD was 2/08 w/ 3cm HH & gastitis... ~  CT Abd 1/09 revealed calcif gallstones, & mild ectasia of AA... ~  CT Abd 1/11 showed left colon divertics, tiny hepatic lesions w/o change & right renal cyst stable. ~  CT Abd 9/12 showed divertics (no inflamm), right renal & left hep lobe cysts, atherosclerotic changes, NAD... ~  Seen by DrEdwards 4/13> c/o dysphagia & congestion; he notes mult GI complaints, known esoph dysmotility w/o stricture, hard to pin down her complaints... ~  She has followed up w/ DrStark regarding her concerns about swelling in her abd... ~  CT Abd Pelvis 3/14 showed Tiny cysts in the lateral segment left hepatic lobe, 1.4 cm right upper pole renal cyst, atherosclerotic calcifications of the abdominal aorta and branch vessels, DJD spine...  DIVERTICULOSIS, COLON (ICD-562.10) - followed regularly by DrStark. ~  colonoscopy 7/03 by DrStark showing divertics only... we discussed Miralax + Senakot-S for constipation. ~  colonoscopy 2/11 showed divertics, 3mm pol69m hems...  ? of ABDOMINAL PAIN & SYMPTOMATOLOGY - she had a very thorough work up in 2009> she has a cancer phobia and is very difficult to reassure her that everything appropriate has been done & no cancer found... Mult additional GI evals since then from [facility], [facility] (DrEdwards), [facility] (DrThorne)... ~  eval by GI- DrStark 12/08 but pt wasn't satisfied w/ his examination... ~  full labs 1/09 were WNL, sed=20... ~  CT Abd/ Pelvis 1/09= no acute abn in abd  (calcif gallstones & mild ectasia of AA), calcif fibroid in the pelvis, & ?regarding the left ovary. ~  referral to DrFontaine, GYN> his notes are reviewed... ~  follow up w/ DrKimbrough, Urology> his notes are reviewed... ~  eval DrWhitfield  for Orthopedics- rec shots in back by [facility] w/ some improvement. ~  repeat GI eval 1/11 by DrStark w/ CT & colonoscpy as above- no acute problems. ~  5/11 & 7/11 >> persist complaints w/ neg recheck by DrStark & DrKimbrough... ~  12/11:  routine GYN surveillance DrFontaine found anteverted uterus w/ fibroids, atrophic ovaries bilat, no mass. ~  Repeat GI eval 4/12 by DrStark> he tried to reassure the pt, felt some symptoms might be from poor abd wall muscle tone, rec continue Omep, laxatives... ~  6/12:  She sought another GI opinion DrEdwards> he did not feel she needed any additional testing... ~  She tells me she has yet another appt to see a gastroenterologist at [facility]==> seen by DrThorne 8/12 & note reviewed... ~  EGD by DrThorne 9/12 w/ mult whitish nodular plaques in cardia & fundus> Bx results pending & she is very concerned (remember her hx cancer phobia). ~  She continues to f/u w/ [facility]Th[facility] & is happy w/ her eval... ~  Now back w/ DrStark at [facility]...  RENAL CYST (ICD-593.2) - she has a 1 cm right renal cyst seen on CT in 2007 and followed by DrKimbrough... f/u CT Abd 1/09 - no change in the cyst... f/u renal ultrasound 1/10 w/ small bilat cysts... f/u CT Abd 1/11- no change in cyst. ~  she saw DrKimbrough 7/11 for LBP, microhematuria, urge incontinence- stable, no additional Rx. ~  she saw [place] 1/12 for urge incontinence, freq, nocturia> rec Desmopressin 0.96m- 1/2 tab Qhs.  CARCINOMA, BREAST, LEFT (ICD-174.9) - Surgery 9/94 by DrBlievernicht w/ Left modified radical mastectomy... +estrogen receptors and Rx w/ Tamoxifen... f/u mammograms all neg... breast exam on right has been neg- no nodules palpated... She says that DrBertrand  insisted that her PCP check her right breast at each & every office visit to be sure she doesn't develop any nodules...  DEGENERATIVE JOINT DISEASE (ICD-715.90) - on OTC meds + VICODIN Prn... she has end-stage OA of Right Knee per DrWhitfield treated w/ shots in the past... she has a knee brace and a cane for ambulation... ~  8/13:  She wants a better pain pill & we discussed change to NNewbornprn... ~  12/13: she tells me that DrWhitfield injected her knee last wk- improved...  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 Rx w/ rest, heat, Percocet, and f/u w/ Ortho...  Hx of LOW BACK PAIN, CHRONIC (ICD-724.2) - eval DrWhitfield w/ rec for shots by DrNewton & she's feeling sl better & wears back brace... ~  5/10: mult somatic complaints- primarily c/o left side/ postero-lat rib pain...  Exam w/ tender left lat/ post ribs... Bone Scan= neg... ~  MRI Lumbar spine 7/10 by DrWhitfield... ~  Epidural steroid shot by DrNewton 8/10 & 1/11 ~  Repeat LBP eval by DrWhitfield w/ another MRI lumbar spine showing degenerative changes w/ mild progression since 7/10 MRI, mild-mod sp stenosis & lat recess stenosis at several levels ~  [facility] but she is not so inclined... ~  6/13: she saw DrTooke w/ lumbar spondylosis; he felt that he had nothing to offer her & rec incr Tramadol to Qid... ~  2014:  She has had numerous visits w/ DrWhitfield/ NErnestina Patchesfor injections, XRays, scans etc...  CARPAL TUNNEL SYNDROME, BILATERAL (ICD-354.0) - she had prev right carpal tunnel release by DrWhitfield and was  c/o increasing discomfort in her left wrist despite wrist splint Qhs- had left carpal tunnel release 1/10 by DrWhitfield...  ANXIETY (ICD-300.00) - on ALPRAZOLAM 0.61m Tid & encouraged to take it regularly... she has a cancer phobia & it is very difficult to reassure her regarding her  symptoms and our evaluations... ~  7/13: she went to the ER c/o swelling of the tongue but eval by EDP indicated that her tongue was normal & not swollen, she was not on an ACE, given Pred Rx anyway. ~  She has been asked to incr the ALprazolam to regular dosing but she is reluctant...   Past Surgical History  Procedure Laterality Date  . Left mastectomy    . Carpal tunnel release      Outpatient Encounter Prescriptions as of 04/04/2014  Medication Sig  . ALPRAZolam (XANAX) 0.5 MG tablet Take 1/2 to 1 tablet by mouth three times daily as needed  . amLODipine (NORVASC) 2.5 MG tablet Take 1 tablet (2.5 mg total) by mouth daily.  .Marland Kitchenaspirin EC 81 MG tablet Take 81 mg by mouth daily.  .Marland Kitchenazithromycin (ZITHROMAX Z-PAK) 250 MG tablet Use as directed  . carvedilol (COREG) 3.125 MG tablet Take 1 tablet (3.125 mg total) by mouth 2 (two) times daily.  . Cholecalciferol (VITAMIN D3) 1000 UNITS CAPS Take 1 capsule by mouth daily. For your bones  . furosemide (LASIX) 40 MG tablet Take 1 tablet (40 mg total) by mouth daily. For swelling  . Multiple Vitamins-Minerals (MULTIVITAMINS THER. W/MINERALS) TABS Take 1 tablet by mouth daily.  . pantoprazole (PROTONIX) 40 MG tablet Take 1 tablet (40 mg total) by mouth 2 (two) times daily.  . polyethylene glycol powder (GLYCOLAX) powder Take 17 g by mouth 2 (two) times daily.  . sennosides-docusate sodium (SENOKOT-S) 8.6-50 MG tablet Take 2 tablets by mouth daily as needed. For constipation  . traMADol (ULTRAM) 50 MG tablet Take 1 tablet (50 mg total) by mouth every 6 (six) hours as needed. As needed for pain    Allergies  Allergen Reactions  . Fentanyl     REACTION: nausea    Current Medications, Allergies, Past Medical History, Past Surgical History, Family History, and Social History were reviewed in [employer].    Review of Systems      See HPI - all other systems neg except as noted... She has mult somatic complaints and  anxiety. The patient complains of decreased hearing, chest discomfort, dyspnea on exertion, abdominal pain, incontinence, muscle weakness, and difficulty walking.  The patient denies anorexia, fever, vision loss, hoarseness, syncope, peripheral edema, prolonged cough, headaches, hemoptysis, melena, hematochezia, severe indigestion/heartburn, hematuria, transient blindness, depression, abnormal bleeding, enlarged lymph nodes, and angioedema.     Objective:   Physical Exam   WD, WN, 80y/o BF in NAD... she is very anxious... GENERAL:  Alert & oriented; pleasant & cooperative... HEENT:  [place]/AT, EOM-wnl, PERRLA, EACs-clear, TMs-wnl, NOSE-clear, THROAT-clear & wnl, no lesions seen. NECK:  Neck ROM decreased, no JVD; normal carotid impulses w/o bruits; no thyromegaly or nodules palpated; no lymphadenopathy. CHEST:  Clear to P & A; without wheezes/ rales/ or rhonchi heard... + tender right lat 11th & 12th ribs on palp. BREAST:  s/p left mastectomy... right breast normal- without nodules palpated... HEART:  Regular Rhythm; without murmurs/ rubs/ or gallops detected... ABDOMEN:  Soft & nontender; normal bowel sounds; no organomegaly or masses palpated... EXT: without deformities, mod arthritic changes; no varicose veins/ +venous insuffic/ tr edema. NEURO:  CN's intact; no focal neuro deficits... DERM:  Small sl excoriated area of dermatitis left shin- no drainage,no rash, etc...  RADIOLOGY DATA:  Reviewed in the EPIC EMR & discussed w/ the patient...  LABORATORY DATA:  Reviewed in the EPIC EMR & discussed w/ the patient...   Assessment & Plan:    RESPIRATORY SYMPTOMS w/ Dyspnea & throat clearing>  She had a neg pulm eval 2011 and numerous times thereafter; she insisted on additional eval 9/13 for her chr throat clearing symptom; CXR w/o acute changes & PFTs normal for her 80 y/o lungs; she is quite insistent on med rx for her problem but has not had relief from anything; REC to take [id number],  Fluids, DELSYM prn; pt very anxious & Alprazolam seems to help when she takes it...  Hx CP> evaluated for Cards by DrBensimon & he has not been in favor of invasive testing; stable on conservative med rx...  GI> HH, Gastritis, Divertics, vague abd pain>  I tried once again to reassure her about the thorough GI evaluations that her has undergone; she is happy w/ DrThorne's eval & will f/u w/ her & in the meanwhile continue rx w/ [id number], Miralax, Metamucil, Senakot, Mylicon, etc... She has re-established w/ [facility]- their notes reviewed...  Hx left Breast Cancer>  No known recurrence...  Anxiety>  Asked once again to increase the Alprazolam to 1/2 - 1 tab TID regularly...   Patient's Medications  New Prescriptions   No medications on file  Previous Medications   ALPRAZOLAM (XANAX) 0.5 MG TABLET    Take 1/2 to 1 tablet by mouth three times daily as needed   AMLODIPINE (NORVASC) 2.5 MG TABLET    Take 1 tablet (2.5 mg total) by mouth daily.   ASPIRIN EC 81 MG TABLET    Take 81 mg by mouth daily.   CARVEDILOL (COREG) 3.125 MG TABLET    Take 1 tablet (3.125 mg total) by mouth 2 (two) times daily.   CHOLECALCIFEROL (VITAMIN D3) 1000 UNITS CAPS    Take 1 capsule by mouth daily. For your bones   FUROSEMIDE (LASIX) 40 MG TABLET    Take 1 tablet (40 mg total) by mouth daily. For swelling   MULTIPLE VITAMINS-MINERALS (MULTIVITAMINS THER. W/MINERALS) TABS    Take 1 tablet by mouth daily.   PANTOPRAZOLE (PROTONIX) 40 MG TABLET    Take 1 tablet (40 mg total) by mouth 2 (two) times daily.   POLYETHYLENE GLYCOL POWDER (GLYCOLAX) POWDER    Take 17 g by mouth 2 (two) times daily.   SENNOSIDES-DOCUSATE SODIUM (SENOKOT-S) 8.6-50 MG TABLET    Take 2 tablets by mouth daily as needed. For constipation   TRAMADOL (ULTRAM) 50 MG TABLET    Take 1 tablet (50 mg total) by mouth every 6 (six) hours as needed. As needed for pain  Modified Medications   No medications on file  Discontinued Medications    AZITHROMYCIN (ZITHROMAX Z-PAK) 250 MG TABLET    Use as directed

## 2014-04-07 ENCOUNTER — Ambulatory Visit (INDEPENDENT_AMBULATORY_CARE_PROVIDER_SITE_OTHER): Payer: Medicare Other | Admitting: Internal Medicine

## 2014-04-07 ENCOUNTER — Encounter: Payer: Self-pay | Admitting: Internal Medicine

## 2014-04-07 VITALS — BP 112/62 | HR 79 | Temp 98.3°F | Wt 139.0 lb

## 2014-04-07 DIAGNOSIS — E785 Hyperlipidemia, unspecified: Secondary | ICD-10-CM

## 2014-04-07 DIAGNOSIS — I10 Essential (primary) hypertension: Secondary | ICD-10-CM

## 2014-04-07 DIAGNOSIS — M19049 Primary osteoarthritis, unspecified hand: Secondary | ICD-10-CM

## 2014-04-07 DIAGNOSIS — T23019A Burn of unspecified degree of unspecified thumb (nail), initial encounter: Secondary | ICD-10-CM

## 2014-04-07 MED ORDER — DICLOFENAC SODIUM 1 % TD GEL: 4.0000 g | Freq: Four times a day (QID) | TRANSDERMAL | Status: DC

## 2014-04-07 MED ORDER — SILVER SULFADIAZINE 1 % EX CREA: 1.0000 "application " | TOPICAL_CREAM | Freq: Every day | CUTANEOUS | Status: DC

## 2014-04-07 NOTE — Progress Notes (Signed)
Pre visit review using our clinic review tool, if applicable. No additional management support is needed unless otherwise documented below in the visit note. 

## 2014-04-07 NOTE — Patient Instructions (Signed)
Please take all new medication as prescribed  Please continue all other medications as before, and refills have been done if requested. Please have the pharmacy call with any other refills you may need.  Please continue your efforts at being more active, lower cholesterol diet

## 2014-04-07 NOTE — Progress Notes (Signed)
Subjective:    Patient ID: Heather [name], female    DOB: 1918-03-27, 81 y.o.   MRN: [id number]  HPI  Here to f/u, unfortuantely with a left thumb burn 3 days ago on an iron at home,  Still painful, but without worsening pain, red, sweling, drainage.  Also c/o hand DJD pain. Pt denies chest pain, increased sob or doe, wheezing, orthopnea, PND, increased LE swelling, palpitations, dizziness or syncope. Pt denies new neurological symptoms such as new headache, or facial or extremity weakness or numbness Past Medical History  Diagnosis Date  . RBBB (right bundle branch block with left anterior fascicular block)   . Hypertension   . Hiatal hernia   . Gastritis, chronic   . Breast cancer   . Degenerative joint disease   . Anxiety state, unspecified   . Low back pain   . Carpal tunnel syndrome   . Renal cyst   . Diverticula of colon   . Cervical spondylosis   . Diastolic dysfunction   . Venous insufficiency   . Constipation   . Colon polyp 12/19/2009    Transverse-polypoid colorectal mucosa  . Allergic rhinitis, cause unspecified 02/21/2014   Past Surgical History  Procedure Laterality Date  . Left mastectomy    . Carpal tunnel release      reports that she has never smoked. She has never used smokeless tobacco. She reports that she does not drink alcohol or use illicit drugs. family history is negative for Colon cancer. Allergies  Allergen Reactions  . Fentanyl     REACTION: nausea   Current Outpatient Prescriptions on File Prior to Visit  Medication Sig Dispense Refill  . ALPRAZolam (XANAX) 0.5 MG tablet Take 1/2 to 1 tablet by mouth three times daily as needed  90 tablet  5  . amLODipine (NORVASC) 2.5 MG tablet Take 1 tablet (2.5 mg total) by mouth daily.  90 tablet  3  . aspirin EC 81 MG tablet Take 81 mg by mouth daily.      . carvedilol (COREG) 3.125 MG tablet Take 1 tablet (3.125 mg total) by mouth 2 (two) times daily.  180 tablet  3  . Cholecalciferol (VITAMIN D3)  1000 UNITS CAPS Take 1 capsule by mouth daily. For your bones      . furosemide (LASIX) 40 MG tablet Take 1 tablet (40 mg total) by mouth daily. For swelling  90 tablet  3  . Multiple Vitamins-Minerals (MULTIVITAMINS THER. W/MINERALS) TABS Take 1 tablet by mouth daily.      . pantoprazole (PROTONIX) 40 MG tablet Take 1 tablet (40 mg total) by mouth 2 (two) times daily.  60 tablet  11  . polyethylene glycol powder (GLYCOLAX) powder Take 17 g by mouth 2 (two) times daily.  527 g  1  . sennosides-docusate sodium (SENOKOT-S) 8.6-50 MG tablet Take 2 tablets by mouth daily as needed. For constipation      . traMADol (ULTRAM) 50 MG tablet Take 1 tablet (50 mg total) by mouth every 6 (six) hours as needed. As needed for pain  30 tablet  5   No current facility-administered medications on file prior to visit.   Review of Systems  Constitutional: Negative for unusual diaphoresis or other sweats  HENT: Negative for ringing in ear Eyes: Negative for double vision or worsening visual disturbance.  Respiratory: Negative for choking and stridor.   Gastrointestinal: Negative for vomiting or other signifcant bowel change Genitourinary: Negative for hematuria or decreased urine volume.  Musculoskeletal: Negative for other MSK pain or swelling Skin: Negative for color change and worsening wound.  Neurological: Negative for tremors and numbness other than noted  Psychiatric/Behavioral: Negative for decreased concentration or agitation other than above       Objective:   Physical Exam BP 112/62  Pulse 79  Temp(Src) 98.3 F (36.8 C) (Oral)  Wt 139 lb (63.05 kg)  SpO2 93% VS noted,  Constitutional: Pt appears well-developed, well-nourished.  HENT: Head: NCAT.  Right Ear: External ear normal.  Left Ear: External ear normal.  Eyes: . Pupils are equal, round, and reactive to light. Conjunctivae and EOM are normal Neck: Normal range of motion. Neck supple.  Cardiovascular: Normal rate and regular rhythm.    Pulmonary/Chest: Effort normal and breath sounds normal.  Neurological: Pt is alert. Not confused , motor grossly intact Skin: left dorsal compartment with 1st/2nd degree burn 1/2 x 1/5 cm area, no swelling, drainage Bilat hands with DIP and PIP bony joint deg changes, no effusions Psychiatric: Pt behavior is normal. No agitation.     Assessment & Plan:

## 2014-04-10 DIAGNOSIS — T23019A Burn of unspecified degree of unspecified thumb (nail), initial encounter: Secondary | ICD-10-CM | POA: Insufficient documentation

## 2014-04-10 DIAGNOSIS — E785 Hyperlipidemia, unspecified: Secondary | ICD-10-CM | POA: Insufficient documentation

## 2014-04-10 DIAGNOSIS — M19049 Primary osteoarthritis, unspecified hand: Secondary | ICD-10-CM | POA: Insufficient documentation

## 2014-04-10 NOTE — Assessment & Plan Note (Signed)
stable overall by history and exam, recent data reviewed with pt, and pt to continue medical treatment as before,  to f/u any worsening symptoms or concerns BP Readings from Last 3 Encounters:  04/07/14 112/62  04/04/14 128/60  03/15/14 140/62

## 2014-04-10 NOTE — Assessment & Plan Note (Signed)
Mild to mod, for silvadene cream prn,  to f/u any worsening symptoms or concerns

## 2014-04-10 NOTE — Assessment & Plan Note (Signed)
stable overall by history and exam, recent data reviewed with pt, and pt to continue medical treatment as before,  to f/u any worsening symptoms or concerns, to cont diet Lab Results  Component Value Date   LDLCALC 96 02/02/2014

## 2014-04-10 NOTE — Assessment & Plan Note (Signed)
Mild to mod, for voltaren gel prn,  to f/u any worsening symptoms or concerns  

## 2014-04-26 ENCOUNTER — Ambulatory Visit (INDEPENDENT_AMBULATORY_CARE_PROVIDER_SITE_OTHER): Payer: Medicare Other | Admitting: Internal Medicine

## 2014-04-26 ENCOUNTER — Encounter: Payer: Self-pay | Admitting: Internal Medicine

## 2014-04-26 VITALS — BP 140/72 | HR 69 | Temp 98.1°F | Wt 140.2 lb

## 2014-04-26 DIAGNOSIS — F411 Generalized anxiety disorder: Secondary | ICD-10-CM

## 2014-04-26 DIAGNOSIS — R634 Abnormal weight loss: Secondary | ICD-10-CM

## 2014-04-26 DIAGNOSIS — M545 Low back pain, unspecified: Secondary | ICD-10-CM

## 2014-04-26 DIAGNOSIS — Z853 Personal history of malignant neoplasm of breast: Secondary | ICD-10-CM

## 2014-04-26 DIAGNOSIS — R627 Adult failure to thrive: Secondary | ICD-10-CM | POA: Insufficient documentation

## 2014-04-26 NOTE — Patient Instructions (Addendum)
Please continue all other medications as before, and refills have been done if requested.  Please have the pharmacy call with any other refills you may need.  Please continue your efforts at being more active, low cholesterol diet, and weight control.  You are otherwise up to date with prevention measures today.  Please keep your appointments with your specialists as you may have planned  Please go to the XRAY Department in the Basement (go straight as you get off the elevator) for the x-ray testing at your convenience  Please go to the LAB in the Basement (turn left off the elevator) for the tests to be done at your convenience, such as tomorrow at the lab  You will be contacted regarding the referral for: pain clinic

## 2014-04-26 NOTE — Progress Notes (Signed)
Pre visit review using our clinic review tool, if applicable. No additional management support is needed unless otherwise documented below in the visit note. 

## 2014-04-26 NOTE — Assessment & Plan Note (Signed)
Amount and rate not clear, for cxr, and repeat UA with the recent microhematuria,  to f/u any worsening symptoms or concerns Lab Results  Component Value Date   WBC 6.3 02/02/2014   HGB 13.8 02/02/2014   HCT 42.0 02/02/2014   PLT 203.0 02/02/2014   GLUCOSE 99 02/02/2014   CHOL 199 02/02/2014   TRIG 20.0 02/02/2014   HDL 98.70 02/02/2014   LDLCALC 96 02/02/2014   ALT 22 02/02/2014   AST 25 02/02/2014   NA 142 02/02/2014   K 4.2 02/02/2014   CL 104 02/02/2014   CREATININE 0.9 02/02/2014   BUN 16 02/02/2014   CO2 31 02/02/2014   TSH 1.90 02/02/2014

## 2014-04-26 NOTE — Assessment & Plan Note (Signed)
Tramadol not working well, ok refer pain management

## 2014-04-26 NOTE — Progress Notes (Signed)
Subjective:    Patient ID: Heather [name], female    DOB: Jan 19, 1918, 80 y.o.   MRN: [id number]  HPI  Here further concerned about wt loss, wondering where her cancer. Peak wt was 175 , states started losing wt last yr, now down to 140 today. Appetite some less, not sure why.  Denies worsening depressive symptoms, suicidal ideation, or panic; has ongoing anxiety.  Wts were poorly or not at all documented by prior PCP.  I found one wt 146 per GI in jan 2015 Pt denies chest pain, increased sob or doe, wheezing, orthopnea, PND, increased LE swelling, palpitations, dizziness or syncope, except has her same "hocking phlegm" cough for several yrs, no blood.  Pt denies fever, night sweats, or other constitutional symptoms  Past Medical History  Diagnosis Date  . RBBB (right bundle branch block with left anterior fascicular block)   . Hypertension   . Hiatal hernia   . Gastritis, chronic   . Breast cancer   . Degenerative joint disease   . Anxiety state, unspecified   . Low back pain   . Carpal tunnel syndrome   . Renal cyst   . Diverticula of colon   . Cervical spondylosis   . Diastolic dysfunction   . Venous insufficiency   . Constipation   . Colon polyp 12/19/2009    Transverse-polypoid colorectal mucosa  . Allergic rhinitis, cause unspecified 02/21/2014   Past Surgical History  Procedure Laterality Date  . Left mastectomy    . Carpal tunnel release      reports that she has never smoked. She has never used smokeless tobacco. She reports that she does not drink alcohol or use illicit drugs. family history is negative for Colon cancer. Allergies  Allergen Reactions  . Fentanyl     REACTION: nausea   Current Outpatient Prescriptions on File Prior to Visit  Medication Sig Dispense Refill  . ALPRAZolam (XANAX) 0.5 MG tablet Take 1/2 to 1 tablet by mouth three times daily as needed  90 tablet  5  . amLODipine (NORVASC) 2.5 MG tablet Take 1 tablet (2.5 mg total) by mouth daily.  90  tablet  3  . aspirin EC 81 MG tablet Take 81 mg by mouth daily.      . carvedilol (COREG) 3.125 MG tablet Take 1 tablet (3.125 mg total) by mouth 2 (two) times daily.  180 tablet  3  . Cholecalciferol (VITAMIN D3) 1000 UNITS CAPS Take 1 capsule by mouth daily. For your bones      . diclofenac sodium (VOLTAREN) 1 % GEL Apply 4 g topically 4 (four) times daily.  100 g  5  . furosemide (LASIX) 40 MG tablet Take 1 tablet (40 mg total) by mouth daily. For swelling  90 tablet  3  . Multiple Vitamins-Minerals (MULTIVITAMINS THER. W/MINERALS) TABS Take 1 tablet by mouth daily.      . pantoprazole (PROTONIX) 40 MG tablet Take 1 tablet (40 mg total) by mouth 2 (two) times daily.  60 tablet  11  . polyethylene glycol powder (GLYCOLAX) powder Take 17 g by mouth 2 (two) times daily.  527 g  1  . sennosides-docusate sodium (SENOKOT-S) 8.6-50 MG tablet Take 2 tablets by mouth daily as needed. For constipation      . silver sulfADIAZINE (SILVADENE) 1 % cream Apply 1 application topically daily.  50 g  0  . traMADol (ULTRAM) 50 MG tablet Take 1 tablet (50 mg total) by mouth every 6 (six)  hours as needed. As needed for pain  30 tablet  5   No current facility-administered medications on file prior to visit.   Review of Systems  Constitutional: Negative for unusual diaphoresis or other sweats  HENT: Negative for ringing in ear Eyes: Negative for double vision or worsening visual disturbance.  Respiratory: Negative for choking and stridor.   Gastrointestinal: Negative for vomiting or other signifcant bowel change Genitourinary: Negative for hematuria or decreased urine volume.  Musculoskeletal: Negative for other MSK pain or swelling Skin: Negative for color change and worsening wound.  Neurological: Negative for tremors and numbness other than noted  Psychiatric/Behavioral: Negative for decreased concentration or agitation other than above       Objective:   Physical Exam BP 140/72  Pulse 69  Temp(Src)  98.1 F (36.7 C) (Oral)  Wt 140 lb 4 oz (63.617 kg)  SpO2 94% VS noted,  Constitutional: Pt appears well-developed, well-nourished.  HENT: Head: NCAT.  Right Ear: External ear normal.  Left Ear: External ear normal.  Eyes: . Pupils are equal, round, and reactive to light. Conjunctivae and EOM are normal Neck: Normal range of motion. Neck supple.  Cardiovascular: Normal rate and regular rhythm.   Pulmonary/Chest: Effort normal and breath sounds normal.  Abd:  Soft,  ND, + BS, mild chronic left tender Neurological: Pt is alert. Not confused , motor grossly intact Skin: Skin is warm. No rash Psychiatric: Pt behavior is normal. No agitation. 2+ nervous    Assessment & Plan:

## 2014-04-26 NOTE — Assessment & Plan Note (Signed)
D/w pt, recent mammogram neg

## 2014-04-26 NOTE — Assessment & Plan Note (Signed)
Quite severe, chronic, delcines other tx at this time

## 2014-05-01 ENCOUNTER — Other Ambulatory Visit (INDEPENDENT_AMBULATORY_CARE_PROVIDER_SITE_OTHER): Payer: Medicare Other

## 2014-05-01 DIAGNOSIS — R634 Abnormal weight loss: Secondary | ICD-10-CM

## 2014-05-01 LAB — URINALYSIS, ROUTINE W REFLEX MICROSCOPIC
Bilirubin Urine: NEGATIVE
Ketones, ur: NEGATIVE
Leukocytes, UA: NEGATIVE
NITRITE: NEGATIVE
SPECIFIC GRAVITY, URINE: 1.02 (ref 1.000–1.030)
Total Protein, Urine: NEGATIVE
Urine Glucose: NEGATIVE
Urobilinogen, UA: 0.2 (ref 0.0–1.0)
pH: 6 (ref 5.0–8.0)

## 2014-05-03 ENCOUNTER — Telehealth: Payer: Self-pay | Admitting: Internal Medicine

## 2014-05-03 ENCOUNTER — Encounter: Payer: Self-pay | Admitting: Internal Medicine

## 2014-05-03 NOTE — Telephone Encounter (Signed)
UA with micro neg for sign of infection, ok to follow for now, consider OV if having further symptoms

## 2014-05-03 NOTE — Telephone Encounter (Signed)
Patient is calling to request lab results from Monday. Please advise.

## 2014-05-03 NOTE — Telephone Encounter (Signed)
Unfortunately, I would not know how to proceed with which tests, without an exam and more information.  Please consider OV  Also, if no fever, ok to take mucinex otc for congestion, or even OTC zyrtec or flonase if seems to have sinus or nasal symptoms

## 2014-05-03 NOTE — Telephone Encounter (Signed)
Patient informed of MD instructions and she does want to schedule an appointment, but will call back once has transportation worked out.

## 2014-05-03 NOTE — Telephone Encounter (Signed)
Patient informed of urine results.  The patient stated she is still concerned that she does not feel well and would like blood test.  Also having a lot of congestion.  Advise please.

## 2014-05-04 ENCOUNTER — Telehealth: Payer: Self-pay | Admitting: Internal Medicine

## 2014-05-04 DIAGNOSIS — R1310 Dysphagia, unspecified: Secondary | ICD-10-CM

## 2014-05-04 NOTE — Telephone Encounter (Signed)
Referral done

## 2014-05-04 NOTE — Telephone Encounter (Signed)
The patient stated she is not nauseated.  States she is coughing and feels down in her throat/chest some thick, slimy, white, lumpy and bitter tasting phlegm.  States she cannot cough it up, can hock it up but nothing comes out.  She has seen a lump of blood.  States Dr. Lenna Gilford had instructed her to take 4 600 mg mucinex per day one year ago when this started.  Symptoms have worsened and she has stopped mucinex.

## 2014-05-04 NOTE — Telephone Encounter (Signed)
Please do refer to GI.

## 2014-05-04 NOTE — Telephone Encounter (Signed)
Patient left message stating that the medicine she is taking is making her sick(she did not say which med).  Should she stop taking?  Please advise.

## 2014-05-04 NOTE — Telephone Encounter (Signed)
Med list reviewed  I dont see any particular med that often causes nausea, except for maybe the tramadol, so can hold on taking this for a short time to see if nausea improves  Remember, other causes of nausea can include constipation or other gastrointestinal problems

## 2014-05-04 NOTE — Telephone Encounter (Signed)
This sounds like a problem that the Gastroenterology normally are able to handle; is she ok with referral?

## 2014-05-05 ENCOUNTER — Telehealth: Payer: Self-pay | Admitting: Gastroenterology

## 2014-05-08 NOTE — Telephone Encounter (Signed)
Left message for patient to call back  

## 2014-05-09 ENCOUNTER — Telehealth: Payer: Self-pay | Admitting: Gastroenterology

## 2014-05-09 ENCOUNTER — Encounter: Payer: Self-pay | Admitting: Internal Medicine

## 2014-05-09 ENCOUNTER — Ambulatory Visit (INDEPENDENT_AMBULATORY_CARE_PROVIDER_SITE_OTHER): Payer: Medicare Other | Admitting: Internal Medicine

## 2014-05-09 ENCOUNTER — Other Ambulatory Visit (INDEPENDENT_AMBULATORY_CARE_PROVIDER_SITE_OTHER): Payer: Medicare Other

## 2014-05-09 VITALS — BP 120/70 | HR 83 | Temp 98.3°F | Wt 139.4 lb

## 2014-05-09 DIAGNOSIS — R1319 Other dysphagia: Secondary | ICD-10-CM

## 2014-05-09 DIAGNOSIS — R634 Abnormal weight loss: Secondary | ICD-10-CM

## 2014-05-09 LAB — SEDIMENTATION RATE: Sed Rate: 10 mm/hr (ref 0–22)

## 2014-05-09 NOTE — Patient Instructions (Signed)
Please continue all other medications as before, and refills have been done if requested.  Please have the pharmacy call with any other refills you may need.  Please continue your efforts at being more active, low cholesterol diet, and weight control.  Please keep your appointments with your specialists as you may have planned - Dr Fuller Plan on July 6  Please go to the LAB in the Basement (turn left off the elevator) for the tests to be done today  You will be contacted by phone if any changes need to be made immediately.  Otherwise, you will receive a letter about your results with an explanation, but please check with MyChart first  Please remember to sign up for MyChart if you have not done so, as this will be important to you in the future with finding out test results, communicating by private email, and scheduling acute appointments online when needed.

## 2014-05-09 NOTE — Assessment & Plan Note (Signed)
Etiology unclear, recent data reviewed with pt, and pt to continue medical treatment as before,  to f/u any worsening symptoms or concerns, for SPEP today Lab Results  Component Value Date   WBC 6.3 02/02/2014   HGB 13.8 02/02/2014   HCT 42.0 02/02/2014   PLT 203.0 02/02/2014   GLUCOSE 99 02/02/2014   CHOL 199 02/02/2014   TRIG 20.0 02/02/2014   HDL 98.70 02/02/2014   LDLCALC 96 02/02/2014   ALT 22 02/02/2014   AST 25 02/02/2014   NA 142 02/02/2014   K 4.2 02/02/2014   CL 104 02/02/2014   CREATININE 0.9 02/02/2014   BUN 16 02/02/2014   CO2 31 02/02/2014   TSH 1.90 02/02/2014

## 2014-05-09 NOTE — Telephone Encounter (Signed)
Patient is scheduled for barium swallow on 05/16/14 10:30 at [facility].  Phone line is busy.  I will continue to try and reach the patient

## 2014-05-09 NOTE — Assessment & Plan Note (Signed)
Has GI appt, hold on further testing for now, may need EGD

## 2014-05-09 NOTE — Progress Notes (Signed)
Pre visit review using our clinic review tool, if applicable. No additional management support is needed unless otherwise documented below in the visit note. 

## 2014-05-09 NOTE — Telephone Encounter (Signed)
I spoke with the patient she is scheduled for 05/22/14 11:00. She will keep this appt.

## 2014-05-09 NOTE — Telephone Encounter (Signed)
Message copied by Greggory Keen on Tue May 09, 2014  4:41 PM ------      Message from: Lucio Edward T      Created: Tue May 09, 2014  3:45 PM       Appears to be scheduled to see me for dysphagia. Please barium esophagram this week or next, at least 3 days before her office appt.  ------

## 2014-05-09 NOTE — Progress Notes (Signed)
Subjective:    Patient ID: Heather [name], female    DOB: 06-19-1918, 80 y.o.   MRN: [id number]  HPI  Here to f/u, very worried she might have an infection or cancer, no fever or pain, but does have ongoing difficutly swallowing solids, cough and chokes occas, no prior egd, has lost another 2 lbs since April 2015, states overall has los 40 lbs over the past yr or so, denies decreased appetite, worsening depression, abd pain. Only takes the lasix prn. Has appt with GI July 6.. Nov 2013 UGI barium swallow  - neg for acute Past Medical History  Diagnosis Date  . RBBB (right bundle branch block with left anterior fascicular block)   . Hypertension   . Hiatal hernia   . Gastritis, chronic   . Breast cancer   . Degenerative joint disease   . Anxiety state, unspecified   . Low back pain   . Carpal tunnel syndrome   . Renal cyst   . Diverticula of colon   . Cervical spondylosis   . Diastolic dysfunction   . Venous insufficiency   . Constipation   . Colon polyp 12/19/2009    Transverse-polypoid colorectal mucosa  . Allergic rhinitis, cause unspecified 02/21/2014   Past Surgical History  Procedure Laterality Date  . Left mastectomy    . Carpal tunnel release      reports that she has never smoked. She has never used smokeless tobacco. She reports that she does not drink alcohol or use illicit drugs. family history is negative for Colon cancer. Allergies  Allergen Reactions  . Fentanyl     REACTION: nausea   Current Outpatient Prescriptions on File Prior to Visit  Medication Sig Dispense Refill  . ALPRAZolam (XANAX) 0.5 MG tablet Take 1/2 to 1 tablet by mouth three times daily as needed  90 tablet  5  . amLODipine (NORVASC) 2.5 MG tablet Take 1 tablet (2.5 mg total) by mouth daily.  90 tablet  3  . aspirin EC 81 MG tablet Take 81 mg by mouth daily.      . carvedilol (COREG) 3.125 MG tablet Take 1 tablet (3.125 mg total) by mouth 2 (two) times daily.  180 tablet  3  .  Cholecalciferol (VITAMIN D3) 1000 UNITS CAPS Take 1 capsule by mouth daily. For your bones      . diclofenac sodium (VOLTAREN) 1 % GEL Apply 4 g topically 4 (four) times daily.  100 g  5  . furosemide (LASIX) 40 MG tablet Take 1 tablet (40 mg total) by mouth daily. For swelling  90 tablet  3  . Multiple Vitamins-Minerals (MULTIVITAMINS THER. W/MINERALS) TABS Take 1 tablet by mouth daily.      . pantoprazole (PROTONIX) 40 MG tablet Take 1 tablet (40 mg total) by mouth 2 (two) times daily.  60 tablet  11  . polyethylene glycol powder (GLYCOLAX) powder Take 17 g by mouth 2 (two) times daily.  527 g  1  . sennosides-docusate sodium (SENOKOT-S) 8.6-50 MG tablet Take 2 tablets by mouth daily as needed. For constipation      . silver sulfADIAZINE (SILVADENE) 1 % cream Apply 1 application topically daily.  50 g  0  . traMADol (ULTRAM) 50 MG tablet Take 1 tablet (50 mg total) by mouth every 6 (six) hours as needed. As needed for pain  30 tablet  5   No current facility-administered medications on file prior to visit.   Review of Systems  Constitutional:  Negative for unusual diaphoresis or other sweats  HENT: Negative for ringing in ear Eyes: Negative for double vision or worsening visual disturbance.  Respiratory: Negative for choking and stridor.   Gastrointestinal: Negative for vomiting or other signifcant bowel change Genitourinary: Negative for hematuria or decreased urine volume.  Musculoskeletal: Negative for other MSK pain or swelling Skin: Negative for color change and worsening wound.  Neurological: Negative for tremors and numbness other than noted  Psychiatric/Behavioral: Negative for decreased concentration or agitation other than above       Objective:   Physical Exam BP 120/70  Pulse 83  Temp(Src) 98.3 F (36.8 C) (Oral)  Wt 139 lb 6 oz (63.22 kg)  SpO2 95% VS noted,  Constitutional: Pt appears well-developed, well-nourished.  HENT: Head: NCAT.  Right Ear: External ear  normal.  Left Ear: External ear normal.  Eyes: . Pupils are equal, round, and reactive to light. Conjunctivae and EOM are normal Neck: Normal range of motion. Neck supple.  Cardiovascular: Normal rate and regular rhythm.   Pulmonary/Chest: Effort normal and breath sounds normal.  Abd:  Soft, NT, ND, + BS Neurological: Pt is alert. Not confused , motor grossly intact Skin: Skin is warm. No rash Psychiatric: Pt behavior is normal. No agitation. mild to mod nervous    Assessment & Plan:

## 2014-05-10 NOTE — Telephone Encounter (Signed)
Patient is notified of the appt date and time for barium swallow at [facility] for 05/16/14 10:30.  She is asked to register at radiology and arrive 10:15

## 2014-05-11 ENCOUNTER — Encounter: Payer: Self-pay | Admitting: Internal Medicine

## 2014-05-11 LAB — PROTEIN ELECTROPHORESIS, SERUM
ALPHA-1-GLOBULIN: 4.6 % (ref 2.9–4.9)
Albumin ELP: 58.9 % (ref 55.8–66.1)
Alpha-2-Globulin: 13.3 % — ABNORMAL HIGH (ref 7.1–11.8)
BETA GLOBULIN: 6 % (ref 4.7–7.2)
Beta 2: 5.4 % (ref 3.2–6.5)
Gamma Globulin: 11.8 % (ref 11.1–18.8)
Total Protein, Serum Electrophoresis: 6.4 g/dL (ref 6.0–8.3)

## 2014-05-15 ENCOUNTER — Encounter: Payer: Self-pay | Admitting: Physical Medicine & Rehabilitation

## 2014-05-16 ENCOUNTER — Ambulatory Visit (HOSPITAL_COMMUNITY)
Admission: RE | Admit: 2014-05-16 | Discharge: 2014-05-16 | Disposition: A | Discharge status: Home / Self Care | Payer: Medicare Other | Source: Ambulatory Visit | Attending: Gastroenterology | Admitting: Gastroenterology

## 2014-05-16 ENCOUNTER — Telehealth: Payer: Self-pay | Admitting: Internal Medicine

## 2014-05-16 DIAGNOSIS — K219 Gastro-esophageal reflux disease without esophagitis: Secondary | ICD-10-CM | POA: Insufficient documentation

## 2014-05-16 DIAGNOSIS — R1310 Dysphagia, unspecified: Secondary | ICD-10-CM | POA: Insufficient documentation

## 2014-05-16 DIAGNOSIS — R1319 Other dysphagia: Secondary | ICD-10-CM

## 2014-05-16 NOTE — Telephone Encounter (Signed)
Patient informed of results.  

## 2014-05-16 NOTE — Telephone Encounter (Signed)
Requesting lab results.  She has a 10:00 appt with Dr. Fuller Plan and wants results before appt.

## 2014-05-17 ENCOUNTER — Telehealth: Payer: Self-pay

## 2014-05-17 NOTE — Telephone Encounter (Signed)
Very sorry, but I have no other ideas at this time, or other treatment or further evaluation to offer.

## 2014-05-17 NOTE — Telephone Encounter (Signed)
Spoke with the patient on 05/16/14 and 05/17/14 informed both days of her lab results from 05/09/14.  Informed result letter has been mailed to her home.  The patient states something is wrong with her due to weight loss and problems she has mentioned at [facility] 04/26/14 and 05/09/14 (with Dr. Jenny Reichmann) with her stomach and swallowing problems.  The patient is requesting some sort of ideas as to what could be wrong with her or a referral to MD that could help her. I did remind her of her appointment with Dr. Fuller Plan on July 05/22/14, which she is aware of and confirmed to be at that appointment.  Advise please

## 2014-05-17 NOTE — Telephone Encounter (Signed)
Patient informed of MD instructions. 

## 2014-05-22 ENCOUNTER — Ambulatory Visit (INDEPENDENT_AMBULATORY_CARE_PROVIDER_SITE_OTHER): Payer: Medicare Other | Admitting: Gastroenterology

## 2014-05-22 ENCOUNTER — Encounter: Payer: Self-pay | Admitting: Gastroenterology

## 2014-05-22 VITALS — BP 100/60 | HR 72 | Ht 61.5 in | Wt 139.0 lb

## 2014-05-22 DIAGNOSIS — R05 Cough: Secondary | ICD-10-CM

## 2014-05-22 DIAGNOSIS — R059 Cough, unspecified: Secondary | ICD-10-CM

## 2014-05-22 DIAGNOSIS — R1319 Other dysphagia: Secondary | ICD-10-CM

## 2014-05-22 DIAGNOSIS — R109 Unspecified abdominal pain: Secondary | ICD-10-CM

## 2014-05-22 NOTE — Progress Notes (Signed)
    History of Present Illness: This is a 80 year old female who occasionally has noted problems swallowing water. EGD in 2012 was unremarkable. Her main complain is a cough and difficulty bringing up your phlegm. She has chronic left sided abd pain which has been evaluated. Colonoscopy in 2011 showed diverticulosis, internal hemrrhoids, hypertrophied anal papillae and 1 small polyp. Abd/pelvic CT 11/2013 showed multilevel lumbar spine DDD.   BA Esophagram on 05/09/14: 1. 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  2. Water siphon test was negative for gastroesophageal reflux.  Current Medications, Allergies, Past Medical History, Past Surgical History, Family History and Social History were reviewed in [employer] record.  Physical Exam: General: Well developed , well nourished, elderly, no acute distress Head: Normocephalic and atraumatic Eyes:  sclerae anicteric, EOMI Ears: Normal auditory acuity Mouth: No deformity or lesions Lungs: Clear throughout to auscultation Heart: Regular rate and rhythm; no murmurs, rubs or bruits Abdomen: Soft, non tender and non distended. No masses, hepatosplenomegaly or hernias noted. Normal Bowel sounds Musculoskeletal: Symmetrical with no gross deformities  Pulses:  Normal pulses noted Extremities: No clubbing, cyanosis, edema or deformities noted Neurological: Alert oriented x 4, grossly nonfocal Psychological:  Alert and cooperative. Normal mood and affect  Assessment and Recommendations:  1. Cough and difficulty clearing phlegm are her main complaints today. GERD is mild and well controlled. There is no GI process causing these complaints. Return to Dr. Jenny Reichmann.  2. Dysphagia to water, infrequently, secondary to a non specific esophageal motility problem. Reassured. No treatment available. Return to PCP for ongoing care. She is released from GI follow up.  3. GERD, mild, none noted on BA  esophagram. Continue pantoprazole at 40 mg daily. Return to PCP for ongoing care. She is released from GI follow up.   4. Chronic left sided abd pain, musculoskeletal in etiology. Return to PCP for ongoing care. She is released from GI follow up.

## 2014-05-22 NOTE — Patient Instructions (Signed)
Ongoing follow up with Dr. Jenny Reichmann.

## 2014-06-23 ENCOUNTER — Telehealth: Payer: Self-pay | Admitting: Pulmonary Disease

## 2014-06-23 MED ORDER — LEVOFLOXACIN 500 MG PO TABS: 500.0000 mg | ORAL_TABLET | Freq: Every day | ORAL | Status: DC

## 2014-06-23 NOTE — Telephone Encounter (Signed)
Per SN---  Continue the mucinex and fluids Sounds like bronchitis rx for levaquin 500 mg  1 daily Align once daily.    Called and spoke with pt and she is aware of SN recs and that this has been called to her pharmacy.  Nothing further is needed.

## 2014-06-23 NOTE — Telephone Encounter (Signed)
Called and spoke with pt and she stated that she has been taking the mucinex with plenty of fluids.   She stated that she is coughing a lot and getting up very thick sputum with lumps of blood.  Pt stated that this happens every morning and the sputum is a very brown color.  Pt is wanting further recs from SN.  Pt has pending appt with SN on 07-03-14.  Please advise. Thanks  Allergies  Allergen Reactions  . Fentanyl     REACTION: nausea    Current Outpatient Prescriptions on File Prior to Visit  Medication Sig Dispense Refill  . ALPRAZolam (XANAX) 0.5 MG tablet Take 1/2 to 1 tablet by mouth three times daily as needed  90 tablet  5  . amLODipine (NORVASC) 2.5 MG tablet Take 1 tablet (2.5 mg total) by mouth daily.  90 tablet  3  . aspirin EC 81 MG tablet Take 81 mg by mouth daily.      . carvedilol (COREG) 3.125 MG tablet Take 1 tablet (3.125 mg total) by mouth 2 (two) times daily.  180 tablet  3  . Cholecalciferol (VITAMIN D3) 1000 UNITS CAPS Take 1 capsule by mouth daily. For your bones      . Dextromethorphan-Menthol (DELSYM COUGH RELIEF MT) Use as directed in the mouth or throat as needed.      . furosemide (LASIX) 40 MG tablet Take 1 tablet (40 mg total) by mouth daily. For swelling  90 tablet  3  . guaiFENesin (MUCINEX) 600 MG 12 hr tablet Take 600 mg by mouth 4 (four) times daily as needed.      . Multiple Vitamins-Minerals (MULTIVITAMINS THER. W/MINERALS) TABS Take 1 tablet by mouth daily.      . pantoprazole (PROTONIX) 40 MG tablet Take 1 tablet (40 mg total) by mouth 2 (two) times daily.  60 tablet  11  . polyethylene glycol powder (GLYCOLAX) powder Take 17 g by mouth 2 (two) times daily.  527 g  1  . sennosides-docusate sodium (SENOKOT-S) 8.6-50 MG tablet Take 2 tablets by mouth daily as needed. For constipation      . traMADol (ULTRAM) 50 MG tablet Take 1 tablet (50 mg total) by mouth every 6 (six) hours as needed. As needed for pain  30 tablet  5   No current facility-administered  medications on file prior to visit.

## 2014-06-25 ENCOUNTER — Encounter (HOSPITAL_COMMUNITY): Payer: Self-pay | Admitting: Emergency Medicine

## 2014-06-25 ENCOUNTER — Emergency Department (HOSPITAL_COMMUNITY)
Admission: EM | Admit: 2014-06-25 | Discharge: 2014-06-25 | Disposition: A | Discharge status: Home / Self Care | Payer: Medicare Other | Source: Home / Self Care | Attending: Family Medicine | Admitting: Family Medicine

## 2014-06-25 DIAGNOSIS — G8929 Other chronic pain: Secondary | ICD-10-CM

## 2014-06-25 DIAGNOSIS — R109 Unspecified abdominal pain: Secondary | ICD-10-CM

## 2014-06-25 LAB — POCT URINALYSIS DIP (DEVICE)
Bilirubin Urine: NEGATIVE
GLUCOSE, UA: NEGATIVE mg/dL
KETONES UR: NEGATIVE mg/dL
NITRITE: NEGATIVE
PH: 5 (ref 5.0–8.0)
Protein, ur: NEGATIVE mg/dL
Specific Gravity, Urine: 1.01 (ref 1.005–1.030)
Urobilinogen, UA: 0.2 mg/dL (ref 0.0–1.0)

## 2014-06-25 NOTE — Discharge Instructions (Signed)
Thank you for coming in today. Please see your doctor as scheduled.  Take up to 650mg  (2 regular tablets) every6 6-8 hours as needed for pain.  If your pain worsens please go to the emergency room.  If your belly pain worsens, or you have high fever, bad vomiting, blood in your stool or black tarry stool go to the Emergency Room.    Abdominal Pain Many things can cause abdominal pain. Usually, abdominal pain is not caused by a disease and will improve without treatment. It can often be observed and treated at home. Your health care provider will do a physical exam and possibly order blood tests and X-rays to help determine the seriousness of your pain. However, in many cases, more time must pass before a clear cause of the pain can be found. Before that point, your health care provider may not know if you need more testing or further treatment. HOME CARE INSTRUCTIONS  Monitor your abdominal pain for any changes. The following actions may help to alleviate any discomfort you are experiencing:  Only take over-the-counter or prescription medicines as directed by your health care provider.  Do not take laxatives unless directed to do so by your health care provider.  Try a clear liquid diet (broth, tea, or water) as directed by your health care provider. Slowly move to a bland diet as tolerated. SEEK MEDICAL CARE IF:  You have unexplained abdominal pain.  You have abdominal pain associated with nausea or diarrhea.  You have pain when you urinate or have a bowel movement.  You experience abdominal pain that wakes you in the night.  You have abdominal pain that is worsened or improved by eating food.  You have abdominal pain that is worsened with eating fatty foods.  You have a fever. SEEK IMMEDIATE MEDICAL CARE IF:   Your pain does not go away within 2 hours.  You keep throwing up (vomiting).  Your pain is felt only in portions of the abdomen, such as the right side or the left lower  portion of the abdomen.  You pass bloody or black tarry stools. MAKE SURE YOU:  Understand these instructions.   Will watch your condition.   Will get help right away if you are not doing well or get worse.  Document Released: 08/13/2005 Document Revised: 11/08/2013 Document Reviewed: 07/13/2013 [facility] Patient Information 2015 [place], [place]. This information is not intended to replace advice given to you by your health care provider. Make sure you discuss any questions you have with your health care provider.

## 2014-06-25 NOTE — ED Notes (Signed)
Patient c/o abdominal pain with weight loss of 40 lbs since December. Patient reports LLQ pain and coughing up phlegm and blood. Patient denies nausea, vomiting or diarrhea. Patient is alert and oriented.

## 2014-06-25 NOTE — ED Provider Notes (Signed)
Heather [name] is a 80 y.o. female who presents to Urgent Care today for LLQ abdominal pain. Patient presents to clinic with a 1 year history of chronic left lower quadrant abdominal pain. This has been evaluated by multiple different providers. She has a diagnosis of diverticulosis and constipation. She notes that the pain is unchanged. She denies any fever or chills but does note a weight loss of 40lbs over the past year or so. She has an appointment with her PCP on Aug 17th. She felt that she could not wait. Patient denies any blood in the stool. She does not want to go to the ER.    Past Medical History  Diagnosis Date  . RBBB (right bundle branch block with left anterior fascicular block)   . Hypertension   . Hiatal hernia   . Gastritis, chronic   . Breast cancer   . Degenerative joint disease   . Anxiety state, unspecified   . Low back pain   . Carpal tunnel syndrome   . Renal cyst   . Diverticula of colon   . Cervical spondylosis   . Diastolic dysfunction   . Venous insufficiency   . Constipation   . Colon polyp 12/19/2009    Transverse-polypoid colorectal mucosa  . Allergic rhinitis, cause unspecified 02/21/2014   History  Substance Use Topics  . Smoking status: Never Smoker   . Smokeless tobacco: Never Used  . Alcohol Use: No   ROS as above Medications: No current facility-administered medications for this encounter.   Current Outpatient Prescriptions  Medication Sig Dispense Refill  . ALPRAZolam (XANAX) 0.5 MG tablet Take 1/2 to 1 tablet by mouth three times daily as needed  90 tablet  5  . amLODipine (NORVASC) 2.5 MG tablet Take 1 tablet (2.5 mg total) by mouth daily.  90 tablet  3  . aspirin EC 81 MG tablet Take 81 mg by mouth daily.      . carvedilol (COREG) 3.125 MG tablet Take 1 tablet (3.125 mg total) by mouth 2 (two) times daily.  180 tablet  3  . Cholecalciferol (VITAMIN D3) 1000 UNITS CAPS Take 1 capsule by mouth daily. For your bones      .  Dextromethorphan-Menthol (DELSYM COUGH RELIEF MT) Use as directed in the mouth or throat as needed.      . furosemide (LASIX) 40 MG tablet Take 1 tablet (40 mg total) by mouth daily. For swelling  90 tablet  3  . guaiFENesin (MUCINEX) 600 MG 12 hr tablet Take 600 mg by mouth 4 (four) times daily as needed.      Marland Kitchen levofloxacin (LEVAQUIN) 500 MG tablet Take 1 tablet (500 mg total) by mouth daily.  7 tablet  0  . Multiple Vitamins-Minerals (MULTIVITAMINS THER. W/MINERALS) TABS Take 1 tablet by mouth daily.      . pantoprazole (PROTONIX) 40 MG tablet Take 1 tablet (40 mg total) by mouth 2 (two) times daily.  60 tablet  11  . polyethylene glycol powder (GLYCOLAX) powder Take 17 g by mouth 2 (two) times daily.  527 g  1  . sennosides-docusate sodium (SENOKOT-S) 8.6-50 MG tablet Take 2 tablets by mouth daily as needed. For constipation      . traMADol (ULTRAM) 50 MG tablet Take 1 tablet (50 mg total) by mouth every 6 (six) hours as needed. As needed for pain  30 tablet  5    Exam:  BP 157/74  Pulse 84  Temp(Src) 98.3 F (36.8 C) (Oral)  Resp 16  SpO2 100% Gen: Well NAD HEENT: EOMI,  MMM Lungs: Normal work of breathing. CTABL Heart: RRR no MRG Abd: NABS, Soft. Nondistended, Minimal TTP LLQ with no rebound or guarding.  Exts: Brisk capillary refill, warm and well perfused.   Results for orders placed during the hospital encounter of 06/25/14 (from the past 24 hour(s))  POCT URINALYSIS DIP (DEVICE)     Status: Abnormal   Collection Time    06/25/14  6:44 PM      Result Value Ref Range   Glucose, UA NEGATIVE  NEGATIVE mg/dL   Bilirubin Urine NEGATIVE  NEGATIVE   Ketones, ur NEGATIVE  NEGATIVE mg/dL   Specific Gravity, Urine 1.010  1.005 - 1.030   Hgb urine dipstick MODERATE (*) NEGATIVE   pH 5.0  5.0 - 8.0   Protein, ur NEGATIVE  NEGATIVE mg/dL   Urobilinogen, UA 0.2  0.0 - 1.0 mg/dL   Nitrite NEGATIVE  NEGATIVE   Leukocytes, UA TRACE (*) NEGATIVE   No results found.  Assessment and  Plan: 80 y.o. female with chronic abdominal pain. Unclear etiology. Doubtful for diverticulitis or UTI.  Plan: Urine culture, tylenol and f/u with PCP. ER if worse.   Discussed warning signs or symptoms. Please see discharge instructions. Patient expresses understanding.   This note was created using Systems analyst. Any transcription errors are unintended.    Gregor Hams, MD 06/25/14 [phone]

## 2014-06-26 ENCOUNTER — Telehealth: Payer: Self-pay | Admitting: Pulmonary Disease

## 2014-06-26 LAB — URINE CULTURE
Colony Count: 4000
Special Requests: NORMAL

## 2014-06-26 NOTE — Telephone Encounter (Signed)
lmomtcb x1 

## 2014-06-27 NOTE — Telephone Encounter (Signed)
lmomtcb x 2  

## 2014-06-28 NOTE — Telephone Encounter (Signed)
LMTCBx3. Tiani Stanbery, CMA  

## 2014-06-28 NOTE — Telephone Encounter (Signed)
Pt returned call, [phone]

## 2014-06-28 NOTE — Telephone Encounter (Signed)
ATC pt line busy x 4 wcb 

## 2014-06-29 NOTE — Telephone Encounter (Signed)
lmtcb x1 

## 2014-06-29 NOTE — Telephone Encounter (Signed)
Per SN---  Stop the mucinex Take the levaquin once daily Increase water intake.

## 2014-06-29 NOTE — Telephone Encounter (Signed)
Spoke with Heather [name]-- upcoming OV with SN 07/03/14 at 11a Heather [name] states that she was given Levaquin 500mg  06/23/14 Heather [name] just started taking on 06/26/14 Heather [name] reports that she is using Mucinex QID with the Levaquin QD. Heather [name] c/o upset stomach-- denies N/V -- states that her stomach has been feeling bad since she started taking it. Heather [name] c/o dry mouth and congestion. Heather [name] wanting to know if she is maybe taking too many medications mixed together?  Allergies  Allergen Reactions  . Fentanyl     REACTION: nausea   Please advise Dr Lenna Gilford. Thanks.   Current Outpatient Prescriptions on File Prior to Visit  Medication Sig Dispense Refill  . ALPRAZolam (XANAX) 0.5 MG tablet Take 1/2 to 1 tablet by mouth three times daily as needed  90 tablet  5  . amLODipine (NORVASC) 2.5 MG tablet Take 1 tablet (2.5 mg total) by mouth daily.  90 tablet  3  . aspirin EC 81 MG tablet Take 81 mg by mouth daily.      . carvedilol (COREG) 3.125 MG tablet Take 1 tablet (3.125 mg total) by mouth 2 (two) times daily.  180 tablet  3  . Cholecalciferol (VITAMIN D3) 1000 UNITS CAPS Take 1 capsule by mouth daily. For your bones      . Dextromethorphan-Menthol (DELSYM COUGH RELIEF MT) Use as directed in the mouth or throat as needed.      . furosemide (LASIX) 40 MG tablet Take 1 tablet (40 mg total) by mouth daily. For swelling  90 tablet  3  . guaiFENesin (MUCINEX) 600 MG 12 hr tablet Take 600 mg by mouth 4 (four) times daily as needed.      Marland Kitchen levofloxacin (LEVAQUIN) 500 MG tablet Take 1 tablet (500 mg total) by mouth daily.  7 tablet  0  . Multiple Vitamins-Minerals (MULTIVITAMINS THER. W/MINERALS) TABS Take 1 tablet by mouth daily.      . pantoprazole (PROTONIX) 40 MG tablet Take 1 tablet (40 mg total) by mouth 2 (two) times daily.  60 tablet  11  . polyethylene glycol powder (GLYCOLAX) powder Take 17 g by mouth 2 (two) times daily.  527 g  1  . sennosides-docusate sodium (SENOKOT-S) 8.6-50 MG tablet Take 2 tablets by mouth daily as needed. For  constipation      . traMADol (ULTRAM) 50 MG tablet Take 1 tablet (50 mg total) by mouth every 6 (six) hours as needed. As needed for pain  30 tablet  5   No current facility-administered medications on file prior to visit.

## 2014-06-29 NOTE — Telephone Encounter (Signed)
Patient aware of rec's per Dr Lenna Gilford.  Nothing further needed.

## 2014-07-03 ENCOUNTER — Ambulatory Visit (INDEPENDENT_AMBULATORY_CARE_PROVIDER_SITE_OTHER): Payer: Medicare Other | Admitting: Pulmonary Disease

## 2014-07-03 ENCOUNTER — Encounter: Payer: Self-pay | Admitting: Pulmonary Disease

## 2014-07-03 VITALS — BP 120/60 | HR 87 | Temp 97.9°F | Ht 62.0 in | Wt 141.8 lb

## 2014-07-03 DIAGNOSIS — J411 Mucopurulent chronic bronchitis: Secondary | ICD-10-CM

## 2014-07-03 DIAGNOSIS — K219 Gastro-esophageal reflux disease without esophagitis: Secondary | ICD-10-CM

## 2014-07-03 DIAGNOSIS — F411 Generalized anxiety disorder: Secondary | ICD-10-CM

## 2014-07-03 DIAGNOSIS — J418 Mixed simple and mucopurulent chronic bronchitis: Secondary | ICD-10-CM

## 2014-07-03 DIAGNOSIS — F450 Somatization disorder: Secondary | ICD-10-CM

## 2014-07-03 DIAGNOSIS — J387 Other diseases of larynx: Secondary | ICD-10-CM

## 2014-07-03 MED ORDER — DM-GUAIFENESIN ER 30-600 MG PO TB12: 1.0000 | ORAL_TABLET | Freq: Two times a day (BID) | ORAL | Status: DC

## 2014-07-03 NOTE — Progress Notes (Signed)
Subjective:    Patient ID: [name], female    DOB: [birth year], 80 y.o.   MRN: [id number]  HPI 80 y/o BF here for a follow up visit... she has multiple medical problems including:  Dyspnea & Anxiety;  HBP;  Hx CP followed by DrBensimhon;  HH/ Gastritis;  Divertics/ Constip;  Functional Abd Pain;  Renal cyst followed by DrKimbrough;  Hx left breast [phone];  DJD/ CSpine spondy/ LBP/ CTS; and she has a cancer phobia...  SEE PREV EPIC NOTES FOR OLDER DATA >>   ~  August 03, 2012:  39moROV & add-on at pt's insistance for "hocking- it's not a cough, it's a hock"; then she proceeds to clear her throat forcefully & "hock-up" some clear phlegm; she notes "thick white foamy slimy sputum w/ flecks of chopped meat in it"; she denies cough/ hemoptysis/ fcs, etc; she notes some mild dyspnea (at baseline) and occas CP from the force of the hocking; she is quite insistent on additional work-up & Rx for this perceived problem:     Exam is clear- no oral lesions, neck is neg, chest is clear...    CXR shows sl overinflation, clear lungs, normal heart size w/ elongation & tortuous Ao...    PFTs are wnl> FVC=2.62 (128%), FEV1=2.20 (158%), %1sec=84; mid-flows=274% of predicted...    Note> she is quite anxious & has a cancer phobia; again asked to take her Alprazolam Tid regularly... We reviewed prob list, meds, xrays and labs> see below for update >> We decided on a trial regimen to help her "hocking" w/ short term f/u to assess efficacy> MUCINEX 60744m 2Bid w/ lots of fluids; HYCODAN syrup- 1tspQid; DULERA100/5- 1puffBid then rinse w/ Listerine... Note> before completing her eval today she insisted in a right breast exam stating that DrBertrand insisted that her PCP examine her breast at every visit> it was neg.  ~  August 30, 2012:  44m88moV & recheck> "I'm just falling apart, meds not helping, Dulera choked me up, mouth glued together" therefore she [facility] Center[facility] better on no Rx she says  "there is something wrong w/ me, I'm losing so much weight, I'm skin & bones" wt today= 156# which is up 8# from last visit; she is c/o snorting phlegm that she says tastes bitter & has little white balls in it (she could not produce any sput despite a lot of hocking in the office today (advised to continue Mucinex600m70mid w/ fluids)......  Also c/o left lat CWP, tender ribs & wears lumbar brace/ rib binder, +takes Hydrocodone/ Tramadol as needed...     We reviewed prob list, meds, xrays and labs> see below for updates >> she declines the 2013 flu vaccine... LABS 10/13:  Chems-wnl;  CBC- wnl;  TSH= 1.79  ~ September 27, 2012: 44mo 7mo& DoretElizetambling on w/ 'flight of ideas" today & due to this we tried a systems approach & touched on all these problems during her visit today>>     Resp symptoms> c/o SOB, hocking phlegm, "this throat business" for which she has seen DrRosen, ENT; now c/o decr hearing, thick phlegm, etc; We reviewed CXR 10/13- clear & wnl; PFT 10/13- normal airflow; Rec- Mucinex600mg-4m, lots of fluids, & f/u w/ ENT for hearing eval...     HBP> on Coreg3.125Bid, Amlod2.5, Lisin40; BP= 120/64 & we reviewed labs from 10/13- all wnl...     HxCP> on ASA81; followed by DrBensimhon/ DrNishCherly Hensenards & their notes are reviewed=> neg ischemic  work up...     VI, Edema> she went to the ER 11/13 w/ edema; VDopplers were neg for DVT; they reiterated the recs for no salt, elevation, support hose, & continue Lasix40.     GI- HH, Gastirits, Divertics, chronic pain & "swelling"> on Omep20- taking 2/d & we discussed derc to one daily; plus Miralax & Senakot-S...     Renal cyst> she is very anxious about everything & has a cancer phobia; DrKimbrough followed this benign simple cyst for yrs- no change...     Hx Breast cancer> she had left mod radical mastectomy 1994 by DrBlievernicht- no known recurrence & she is given reassurance...     Ortho- DJD, Cspine spongy, LBP, CTS> followed by  DrWhitfield; on Vicodin & Tramadol but prefers the latter she says...     Anxiety> on Alpraz0.35mTid but "I'm not taking it very often" she says & encouraged to use more regularly...  We reviewed prob list, meds, xrays and labs> see below for updates >>   ~  October 27, 2012:  146moOV & Gwenlyn has mult somatic complaints as usual- dry mouth at night, hocking thick phlegm, c/o stomach/ back/ etc... Again we reviewed the following problems during today's visit>>    Resp symptoms> on Mucinex2Bid +Fluids; she saw DrRosen, ENT 11/13- dysphagia, presbycusis, referral otalgia; exam was normal, barium swallow 11/13 showed diminished primary peristaltic wave, mild tertiary contractions, prominent cricopharyngeus muscle, no hiatal hernia/ no reflux/ barium pill passed into the stomach without delay...    HBP> on Coreg3.125Bid, Amlod2.5, Lisin40; BP= 120/60 & she persists w/ mult somatic complaints...    HxCP> on ASA81; followed by DrBensimhon/ DrNishan for Cards & their notes are reviewed=> neg ischemic work up previously...     VI, Edema> she went to the ER 11/13 w/ edema; VDopplers were neg for DVT; they reiterated the recs for no salt, elevation, support hose, & continue Lasix40.     GI- HH, Gastirits, Divertics, chronic pain & "swelling"> on Omep2065m plus Miralax & Senakot-S; she states "my stomach has gone to my back".    Renal cyst> she is very anxious about everything & has a cancer phobia; DrKimbrough followed this benign simple cyst for yrs- no change...     Hx Breast cancer> she had left mod radical mastectomy 1994 by DrBlievernicht- no known recurrence & she is given reassurance...     Ortho- DJD, Cspine spondy, LBP, CTS> followed by DrWhitfield & he injected her right knee 1wk ago; on Vicodin & Tramadol but prefers the latter she says...     Anxiety> on Alpraz0.5mg46m but "I'm not taking it very often" she says & encouraged to use more regularly...  We reviewed prob list, meds, xrays and labs>  see below for updates >>   ~  December 13, 2012:  6wk Harmony a good holiday but launches into her usual litany of complaints> feels bad in the mornings, legs sore "they get hot", "I get hot in my back", c/o "hocking up little white balls of phlegm", stools are dark & small, etc... She thinks she needs massage for her back problems & has seen DrWhitfield w/ PT recommended... She is rec to increase Mucinex 600mg21md w/ Fluids;  Not currently c/o dyspnea;  BP well controlled on meds;  She has chronic GI complaints managed by DrStark & she had no specific GI complaints today;  She has Vicodin & Tramadol for pain;  She has AlpraOptician, dispensinganxiety & rec (again) to take it  more regularly...     We reviewed prob list, meds, xrays and labs> see below for updates >>   ~  January 07, 2013:  3-4wk ROV & add-on for multiple somatic complaints> MsHarris insisted on this add-on visit due to mult somatic complaints & when asked what specifically she was complaining about she launched into a flight of ideas all over the place> stiff; worse & worse; phlegm w/ white flecks & she cannot recalll our mult prev discussions about this w/ rec for Mucinex-2Bid & lots of fluids; legs jerking (rec for tonic water at bedtime); "my back is on fire" esp at night (rec to try Gabapentin 146m 1-2 at bedtime); wt loss w/ fair appetite (rec to drink Ensure 2 cans/d betw meals); wants to try massage therapy (I told her ok w/ me)...  ~  March 14, 2013:  288moOV & Yaniris persists w/ mult somatic complaints and seems almost inconsolable regarding her health; recall that she has a cancer phobia & she has been impossible to reassure in this regard no matter how many tests we run or specialists we send her to... Today she is concerned about "losing weight right & left" but our scale says she has gained 2# to 147# & her BMI= 28-29;  She continues to c/o harking phlegm & congestion ("thick lumpy slimy balls of white meat"), states her urine  is very yellow in the evening, and her left arm & right leg are swelling... She is also c/o back pain & she has seen DrWhitfield, Ortho 4 times since I saw her last- XRays w/ extensive multilevel DDD in spine, Bone Scan w/o boney mets, "I'm getting therapy"... We discussed checking Sput C&S (NTF only), and increasing her Mucinex60066mo 2Bid + fluids; her recent UA & cult were neg as well...    BP remains well controlled on her 3me61m  GI is stable on Prilosec & laxatives, followed by DrJEdwards;  She has Xanax for anxiety but she still won't take it regularly as I havew recommended to her...     We reviewed prob list, meds, xrays and labs> see below for updates >>  NOTE: CT Chest, Abd, Pelvis done 3/14 for additional screening after call from Ortho PA regarding pt's c/o wt loss etc; reports reviewed=> no acute abn identified...   ~  May 30, 2013:  2-18mo 26mo& DoretMileighoon to be 89 y/89 stable without new complaints but says "terrible, terrible, terrible" & still mentions the perceived "swelling" in her left side of abd ("it's so big i have to hold it up" & asked to try an abd binder) and again mentions a mild cough productive of "little white balls of mucous"; she states "DrCrossley can't find where the phlegm is coming from, c/o "hocking little white balls that makes me sick";  [facility] on Tramadol...     We reviewed prob list, meds, xrays and labs> see below for updates >> She is rec to continue f/u w/ her specialists to get the reassurance she needs to know that everything possible is being done to address her concerns... Asked to take the Alpraz0.5mgTi101megularly (she never does)...  ~  July 20, 2013:  6wk ROV & nothing has changed for Dorthea- still c/o pain, swelling, etc... She has had follow ups w/GI & Ortho- notes reviewed; she remains very anxious w/ mult somatic complaints and as prev noted has a cancer phobia but is totally impossible to console,  reassure, etc... Wonders why she is losing so much weight (weight is up 4# to 152# today) & we reviewed no salt, elevation, support hose, & the Lasix...     We reviewed prob list, meds, xrays and labs> see below for updates >>  she refuses the Flu vaccine... She is asked to ret for Fasting blood work=> she never did...  ~  September 06, 2013:  6wk Harrison says "I'm sick, so clogged up, I can feel all my guts" (says she can feel her intestines, but has lost her stomach) w/ her usual mult somatic complaints but few physical findings;  We decided to do CXR, Ambulatory O2 check, and non-fasting labs today...     We reviewed prob list, meds, xrays and labs> see below for updates >> She wants handicap sticker- OK... CXR 10/14 showed normal heart size w/ tortuous atherosclerotic Ao, clear lungs w/ ?vague opac left mid zone adjacent to hilum (nothing seen in this area on CT 3/14) ?overlying shadows & we will f/u... Ambulatory O2 check> O2 sat= 98% on room air at rest w/ pulse=83, regular; Ambulated 3 laps w/ lowest O2 sat= 96% w/ pulse=108... LABS 10/14:  Chems- wnl;  CBC- wnl;  TSH=2.04;  VitD=71;  Sed=11...   ~  December 07, 2013:  80moROV & Noella has no new complaints today; she voiced concern over prev [place] visit w/ N&V, had IV, ?got infected "they had to operate on my hand", but her CC is the bill from [employer].. She persists w/ GI complaints> gas, N&V, swelling, etc;  Throat symptoms and she's seen DrCrossley;  LBP and she's followed by DrWhitfield, shots w/o help she says;  Cancer phobia as we've noted in the past but she is unconsolable...     HBP> on Coreg3.125Bid, Amlod2.5, Lisin40; BP= 100/60 & she persists w/ mult somatic complaints...    HxCP> on ASA81; followed by DrBensimhon/ DrNishan for Cards & their prev notes are reviewed=> neg ischemic work up previously...     VI, Edema> she went to the ER 11/13 w/ edema; VDopplers were neg for DVT; they reiterated the recs for no salt, elevation,  support hose, & continue Lasix40.     GI- HH, Gastirits, Divertics, chronic pain & "swelling"> on Omep231md plus Miralax & Senakot-S; she persists w/ mult GI complaints despite [facility], [facility]-DrThorne...    Renal cyst> she is very anxious about everything & has a cancer phobia; DrKimbrough followed this benign simple cyst for yrs- no change...     Hx Breast cancer> she had left mod radical mastectomy 1994 by DrBlievernicht- no known recurrence & she is given reassurance...     Ortho- DJD, Cspine spondy, LBP, CTS> followed by DrWhitfield & he injected her right knee & her back; on Vicodin & Tramadol but prefers the latter she says...     Anxiety> on Alpraz0.80m80md but "I'm not taking it very often" she says & encouraged to use more regularly...  We reviewed prob list, meds, xrays and labs> see below for updates >> she wants a ZPak for the drainage in her throat...  ~  Apr 04, 2014:  72mo672mo & Marlena has established w/ DrJohn for Primary care and has seen him x3 already... She still sees DrCrosslry for ENT and DrStark for GI>>    From the Pulmonary standpoint she is c/o similar symptoms to prev- cough, harking up thick balls of white phlegm "it's so bad- big lumps, it's so slimy that  it just jumps out, it's unbearable"; she notes some mild SOB/DOE but not progressive & by all accounts she gets along remarkably well for 80 y/o; she denies f/c/s but notes "I get hot"; she denies CP, palpit, dizzy, edema; prev asked to take Mucinex but she wouldn't do it because DrCrossley told her not to for some reason but she is no better on his regimen...    BASELINE DATA>>   Baseline CXRs showed sl overinflation, clear lungs, normal heart size w/ elongation & tortuous Ao...  Prev CT Chest/ CTA 3/14 showed no PE, no acute findings, +atherosclerotic changes, several scattered tiny nodules 3-679m size & no change from 2009 scan, s/p left mastectomy, DJD Tspine...   PFTs 9/13 were WNL> FVC=2.62  (128%), FEV1=2.20 (158%), %1sec=84; mid-flows=274% of predicted  CXR 04/04/14 shows stable heart size, atherosclerotic & tortuous Ao, mild hyperinflation, clear lungs w/o acute changes, DJD Tspine...  PLAN:  She is asked to start MUCINEX 6075mone tab Qid w/ plenty of water; and DELSYM 2 tsp po Qhs...  I tried to give her maximum reassurance & we will recheck her in 3 months...  ~  July 03, 2014:  79m29moV & in the interval Anabelen has seen DrJohn x3, DrStark x1, and [facility])-  regarding her abd discomfort, wt loss, LPR/ coking/ coughing...    From the pulmonary standpoint she says she is breathing OK, then she says she meant "terrible" and notes the "I hark a whole lot" stating the phlegm is thick & bitter w/ big lumps & wakes her up at 3-4AM; she cannot articulate any real SOB/DOE & she remains active;  She stopped the prev Mucinex and uses Delsym prn- as noted prev PFT in 2013 was wnl;  She has mult mult somatic complaints...  We discussed rechecking PFT & Ambulatory O2 sat test today...  PFT > despite mult attempts w/ several test administrators/ staff- she was unable to perform the procedure due to inability to cooperate...  Ambulatory O2sat Test on RA>  Baseline O2sat= 99% w/ pulse=86;  After 3 laps her nadir O2sat= 96% w/ max pulse= 114... PLAN>> we decided to add HUMIBID-DM one cap Bid w/ Fluids (she won't take Mucinex due to something that DrCrossley said yrs ago); we reviewed the important ANTI-REFLUX regimen to help her nocturnal symptoms...           Problem List:  RESPIRATORY SYMPTOMS >> c/o "hocking phlegm" DYSPNEA (ICD-786.9) - eval 5/11 c/o SOB- can't get a DB, can't get enough air in, etc... occurs at rest & w/ exerc "I'm very active despite my arthritis", talks rapidly in long sentences w/o apparent distress... no cough, phlegm, hemoptysis, etc- but as usual she is quite distressed by these symptoms and wants further eval> we discussed re-assessment  w/ CXR (clear, NAD);  PFT (totally norm airflow);  Labs (all WNL);  Rx w/ ALPRAZOLAM 0.5mg24md & encouraged to take regularly. ~  CXR 4/13 showed norm heart size, clear lungs, mild TSpine spondylosis... ~  She continues to complain of drainage, throat symptoms, etc; Rx w/ Mucinex, OTC antihist, Nasonex, etc;  Seen by ENT, DrRosen w/ ?xerostomia & rec incr water etc. ~  9/13: Add-on visit for "hocking, it's not a cough, it's a hock"> see above... ~  9/13: CXR shows sl overinflation, clear lungs, normal heart size w/ elongation & tortuous Ao;  PFTs are wnl> FVC=2.62 (128%), FEV1=2.20 (158%), %1sec=84; mid-flows=274% of predicted... We discussed trial rx for her symptoms-  Mucinex, Fluids, Hycodan, Dulera100- 1puffbid. ~  10/13: she reports [facility] to [facility] w/ throat & mouth symptoms "like glue" therefore stopped this med; advised to continue the others. ~  3/14:  CT Chest (done after call from Ortho PA regarding her symptoms)=> then CTA (done via an ER visit) showed no PE, no acute findings, +atherosclerotic changes, several scattered tiny nodules 3-72m size & no change from 2009 scan, s/p left mastectomy, DJD Tspine...  ~  4/14: she is asked to increase the [facility]60[facility] plenty [facility]... ~  10/14: CXR showed normal heart size w/ tortuous atherosclerotic Ao, clear lungs w/ ?vague opac left mid zone adjacent to hilum (nothing seen in this area on CT 3/14) ?overlying shadows & we will f/u...  ~  CXR 04/04/14 shows stable heart size, atherosclerotic & tortuous Ao, mild hyperinflation, clear lungs w/o acute changes, DJD Tspine...     SHE HAS ESTABLISHED w/ [facility]JOHN [facility] [place]>   HYPERTENSION (ICD-401.9) - on AMLODIPINE 2.12m42m,  COREG 3.1212m59m,  LASIX 40mg6mking 1/2tab/d because 1 tab makes her too weak...  ~  5/15:  BP= 128/60 on AMLODIPINE2.5, COREG3.125Bid, LASIX40...  Hx of CHEST PAIN (ICD-786.50) - she takes ASA 81mg/12mTylenol Prn... followed by DrBensimhon for  Cards. ~  Baseline EKG w/ RBBB & LAD, no acute changes... ~  NuclearStressTest done 2007 & 2009 showed normal- without scar or ischemia, EF=67%...   ~  2DEcho 7/08 showed mild AI, mild diastolic dysfunction, EF=55-ZW=25-85%T Abd 1/09 & 9/12 showed incidental coronary calcif, & aorto-iliac atherosclerotic changes as well... ~  8/13: she saw DrNishan> HBP, DD, atypCP w/ neg Myoviews & no changes made... ~  10/13:  She uses a back brace/ abd binder to help her CWP...  VENOUS INSUFFICIENCY (ICD-459.81) - she has mod VI and follows a low sodium diet, elevates legs when able, and wears support hose. ~  7/12:  C/o incr edema & we reviewed a 2gm sodium diet & increased her Lasix from 20mg/d14m40mg/d.42m  5/13:  She has persist complaints but mild chr VI w/ min edema & reminded to elim sodium, elev, support hose, take the Lasix... ~ 11/13: she went to the ER w/ edema; VDopplers were neg for DVT; they reiterated the recs for no salt, elevation, support hose, & continue Lasix40.  HIATAL HERNIA (ICD-553.3) & GASTRITIS (ICD-535.50) - on OMEPRAZOLE 20mg/d &17mlowed by DrJEdwards for GI having fired DrStark & [facility] in past. ~  last EGD was 2/08 w/ 3cm HH & gastitis... ~  CT Abd 1/09 revealed calcif gallstones, & mild ectasia of AA... ~  CT Abd 1/11 showed left colon divertics, tiny hepatic lesions w/o change & right renal cyst stable. ~  CT Abd 9/12 showed divertics (no inflamm), right renal & left hep lobe cysts, atherosclerotic changes, NAD... ~  Seen by DrEdwards 4/13> c/o dysphagia & congestion; he notes mult GI complaints, known esoph dysmotility w/o stricture, hard to pin down her complaints... ~  She has followed up w/ DrStark regarding her concerns about swelling in her abd... ~  CT Abd Pelvis 3/14 showed Tiny cysts in the lateral segment left hepatic lobe, 1.4 cm right upper pole renal cyst, atherosclerotic calcifications of the abdominal aorta and branch vessels, DJD spine...  DIVERTICULOSIS, COLON  (ICD-562.10) - followed regularly by DrStark. ~  colonoscopy 7/03 by DrStark showing divertics only... we discussed Miralax + Senakot-S for constipation. ~  colonoscopy 2/11 showed divertics, 3mm104m  polyp, hems...  ? of ABDOMINAL PAIN & SYMPTOMATOLOGY - she had a very thorough work up in 2009> she has a cancer phobia and is very difficult to reassure her that everything appropriate has been done & no cancer found... Mult additional GI evals since then from  GI, [facility] (DrEdwards), [facility] (DrThorne)... ~  eval by GI- DrStark 12/08 but pt wasn't satisfied w/ his examination... ~  full labs 1/09 were WNL, sed=20... ~  CT Abd/ Pelvis 1/09= no acute abn in abd (calcif gallstones & mild ectasia of AA), calcif fibroid in the pelvis, & ?regarding the left ovary. ~  referral to DrFontaine, GYN> his notes are reviewed... ~  follow up w/ DrKimbrough, Urology> his notes are reviewed... ~  eval DrWhitfield for Orthopedics- rec shots in back by [facility] w/ some improvement. ~  repeat GI eval 1/11 by DrStark w/ CT & colonoscpy as above- no acute problems. ~  5/11 & 7/11 >> persist complaints w/ neg recheck by DrStark & DrKimbrough... ~  12/11:  routine GYN surveillance DrFontaine found anteverted uterus w/ fibroids, atrophic ovaries bilat, no mass. ~  Repeat GI eval 4/12 by DrStark> he tried to reassure the pt, felt some symptoms might be from poor abd wall muscle tone, rec continue Omep, laxatives... ~  6/12:  She sought another GI opinion DrEdwards> he did not feel she needed any additional testing... ~  She tells me she has yet another appt to see a gastroenterologist at [facility]==> seen by DrThorne 8/12 & note reviewed... ~  EGD by DrThorne 9/12 w/ mult whitish nodular plaques in cardia & fundus> Bx results pending & she is very concerned (remember her hx cancer phobia). ~  She continues to f/u w/ DrThorne at [facility] & is happy w/ her eval... ~  Now back w/ DrStark at [place]...  RENAL CYST (ICD-593.2) -  she has a 1 cm right renal cyst seen on CT in 2007 and followed by DrKimbrough... f/u CT Abd 1/09 - no change in the cyst... f/u renal ultrasound 1/10 w/ small bilat cysts... f/u CT Abd 1/11- no change in cyst. ~  she saw DrKimbrough 7/11 for LBP, microhematuria, urge incontinence- stable, no additional Rx. ~  she saw Warrick 1/12 for urge incontinence, freq, nocturia> rec Desmopressin 0.54m- 1/2 tab Qhs.  CARCINOMA, BREAST, LEFT (ICD-174.9) - Surgery 9/94 by DrBlievernicht w/ Left modified radical mastectomy... +estrogen receptors and Rx w/ Tamoxifen... f/u mammograms all neg... breast exam on right has been neg- no nodules palpated... She says that DrBertrand insisted that her PCP check her right breast at each & every office visit to be sure she doesn't develop any nodules...  DEGENERATIVE JOINT DISEASE (ICD-715.90) - on OTC meds + VICODIN Prn... she has end-stage OA of Right Knee per DrWhitfield treated w/ shots in the past... she has a knee brace and a cane for ambulation... ~  8/13:  She wants a better pain pill & we discussed change to NTonasketprn... ~  12/13: she tells me that DrWhitfield injected her knee last wk- improved...  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 Rx w/ rest, heat, Percocet, and f/u w/ Ortho...  Hx of LOW BACK PAIN, CHRONIC (ICD-724.2) - eval DrWhitfield w/ rec for shots by DrNewton & she's feeling sl better & wears back brace... ~  5/10: mult somatic complaints- primarily c/o left side/ postero-lat rib pain...  Exam w/ tender left  lat/ post ribs... Bone Scan= neg... ~  MRI Lumbar spine 7/10 by DrWhitfield... ~  Epidural steroid shot by DrNewton 8/10 & 1/11 ~  Repeat LBP eval by DrWhitfield w/ another MRI lumbar spine showing degenerative changes w/ mild progression since 7/10 MRI, mild-mod sp stenosis & lat recess stenosis at several levels ~  DrWhitfield referred her to  [facility] to consider more shots but she is not so inclined... ~  6/13: she saw DrTooke w/ lumbar spondylosis; he felt that he had nothing to offer her & rec incr Tramadol to Qid... ~  2014:  She has had numerous visits w/ DrWhitfield/ [facility] Patches for injections, XRays, scans etc...  CARPAL TUNNEL SYNDROME, BILATERAL (ICD-354.0) - she had prev right carpal tunnel release by DrWhitfield and was c/o increasing discomfort in her left wrist despite wrist splint Qhs- had left carpal tunnel release 1/10 by DrWhitfield...  ANXIETY (ICD-300.00) - on ALPRAZOLAM 0.77m Tid & encouraged to take it regularly... she has a cancer phobia & it is very difficult to reassure her regarding her symptoms and our evaluations... ~  7/13: she went to the ER c/o swelling of the tongue but eval by EDP indicated that her tongue was normal & not swollen, she was not on an ACE, given Pred Rx anyway. ~  She has been asked to incr the ALprazolam to regular dosing but she is reluctant...   Past Surgical History  Procedure Laterality Date  . Left mastectomy    . Carpal tunnel release      Outpatient Encounter Prescriptions as of 07/03/2014  Medication Sig  . ALPRAZolam (XANAX) 0.5 MG tablet Take 1/2 to 1 tablet by mouth three times daily as needed  . amLODipine (NORVASC) 2.5 MG tablet Take 1 tablet (2.5 mg total) by mouth daily.  .Marland Kitchenaspirin EC 81 MG tablet Take 81 mg by mouth daily.  . carvedilol (COREG) 3.125 MG tablet Take 1 tablet (3.125 mg total) by mouth 2 (two) times daily.  . Cholecalciferol (VITAMIN D3) 1000 UNITS CAPS Take 1 capsule by mouth daily. For your bones  . Dextromethorphan-Menthol (DELSYM COUGH RELIEF MT) Use as directed in the mouth or throat as needed.  . furosemide (LASIX) 40 MG tablet Take 1 tablet (40 mg total) by mouth daily. For swelling  . guaiFENesin (MUCINEX) 600 MG 12 hr tablet Take 600 mg by mouth 4 (four) times daily as needed.  . Multiple Vitamins-Minerals (MULTIVITAMINS THER. W/MINERALS) TABS Take  1 tablet by mouth daily.  . pantoprazole (PROTONIX) 40 MG tablet Take 1 tablet (40 mg total) by mouth 2 (two) times daily.  . polyethylene glycol powder (GLYCOLAX) powder Take 17 g by mouth 2 (two) times daily.  . sennosides-docusate sodium (SENOKOT-S) 8.6-50 MG tablet Take 2 tablets by mouth daily as needed. For constipation  . traMADol (ULTRAM) 50 MG tablet Take 1 tablet (50 mg total) by mouth every 6 (six) hours as needed. As needed for pain  . dextromethorphan-guaiFENesin (MUCINEX DM) 30-600 MG per 12 hr tablet Take 1 tablet by mouth 2 (two) times daily.  . [DISCONTINUED] levofloxacin (LEVAQUIN) 500 MG tablet Take 1 tablet (500 mg total) by mouth daily.    Allergies  Allergen Reactions  . Fentanyl     REACTION: nausea    Current Medications, Allergies, Past Medical History, Past Surgical History, Family History, and Social History were reviewed in [employer].    Review of Systems      See HPI - all other systems neg except  as noted... She has mult somatic complaints and anxiety. The patient complains of decreased hearing, chest discomfort, dyspnea on exertion, abdominal pain, incontinence, muscle weakness, and difficulty walking.  The patient denies anorexia, fever, vision loss, hoarseness, syncope, peripheral edema, prolonged cough, headaches, hemoptysis, melena, hematochezia, severe indigestion/heartburn, hematuria, transient blindness, depression, abnormal bleeding, enlarged lymph nodes, and angioedema.     Objective:   Physical Exam   WD, WN, 80 y/o BF in NAD... she is very anxious... GENERAL:  Alert & oriented; pleasant & cooperative... HEENT:  [place]/AT, EOM-wnl, PERRLA, EACs-clear, TMs-wnl, NOSE-clear, THROAT-clear & wnl, no lesions seen. NECK:  Neck ROM decreased, no JVD; normal carotid impulses w/o bruits; no thyromegaly or nodules palpated; no lymphadenopathy. CHEST:  Clear to P & A; without wheezes/ rales/ or rhonchi heard... BREAST:  s/p left  mastectomy... right breast normal- without nodules palpated... HEART:  Regular Rhythm; without murmurs/ rubs/ or gallops detected... ABDOMEN:  Soft & nontender; normal bowel sounds; no organomegaly or masses palpated... EXT: without deformities, mod arthritic changes; no varicose veins/ +venous insuffic/ tr edema. NEURO:  CN's intact; no focal neuro deficits... DERM:  Small sl excoriated area of dermatitis left shin- no drainage,no rash, etc...  RADIOLOGY DATA:  Reviewed in the EPIC EMR & discussed w/ the patient...  LABORATORY DATA:  Reviewed in the EPIC EMR & discussed w/ the patient...   Assessment & Plan:    RESPIRATORY SYMPTOMS w/ Dyspnea & throat clearing, prob LPR>  She had a neg pulm eval 2011 and numerous times thereafter; she insisted on additional eval 9/13 for her chr throat clearing symptom; CXR w/o acute changes & PFTs normal for her 80 y/o lungs; she is quite insistent on med rx for her problem but has not had relief from anything; REC to take [id number], Fluids, DELSYM prn; pt very anxious & Alprazolam seems to help when she takes it... 8/15> she won't take the Mucinex due to something that DrCrossley said; therefore rec Rx HumibidDM one cap Bid... and strict antireflux regimen w/ Protonix before dinner, NPO after dinner, elev HOB 6" blocks...   Hx CP> evaluated for Cards by DrBensimon & he has not been in favor of invasive testing; stable on conservative med rx...  GI> HH, Gastritis, Divertics, vague abd pain>  I tried once again to reassure her about the thorough GI evaluations that her has undergone; she is happy w/ DrThorne's eval & will f/u w/ her & in the meanwhile continue rx w/ [id number], Miralax, Metamucil, Senakot, Mylicon, etc... She has re-established w/ [facility]- their notes reviewed...  Hx left Breast Cancer>  No known recurrence...  Anxiety>  Asked once again to increase the Alprazolam to 1/2 - 1 tab TID regularly...   Patient's Medications  New  Prescriptions   DEXTROMETHORPHAN-GUAIFENESIN (MUCINEX DM) 30-600 MG PER 12 HR TABLET    Take 1 tablet by mouth 2 (two) times daily.  Previous Medications   ALPRAZOLAM (XANAX) 0.5 MG TABLET    Take 1/2 to 1 tablet by mouth three times daily as needed   AMLODIPINE (NORVASC) 2.5 MG TABLET    Take 1 tablet (2.5 mg total) by mouth daily.   ASPIRIN EC 81 MG TABLET    Take 81 mg by mouth daily.   CARVEDILOL (COREG) 3.125 MG TABLET    Take 1 tablet (3.125 mg total) by mouth 2 (two) times daily.   CHOLECALCIFEROL (VITAMIN D3) 1000 UNITS CAPS    Take 1 capsule by mouth daily. For your bones   DEXTROMETHORPHAN-MENTHOL (  DELSYM COUGH RELIEF MT)    Use as directed in the mouth or throat as needed.   FUROSEMIDE (LASIX) 40 MG TABLET    Take 1 tablet (40 mg total) by mouth daily. For swelling   GUAIFENESIN (MUCINEX) 600 MG 12 HR TABLET    Take 600 mg by mouth 4 (four) times daily as needed.   MULTIPLE VITAMINS-MINERALS (MULTIVITAMINS THER. W/MINERALS) TABS    Take 1 tablet by mouth daily.   PANTOPRAZOLE (PROTONIX) 40 MG TABLET    Take 1 tablet (40 mg total) by mouth 2 (two) times daily.   POLYETHYLENE GLYCOL POWDER (GLYCOLAX) POWDER    Take 17 g by mouth 2 (two) times daily.   SENNOSIDES-DOCUSATE SODIUM (SENOKOT-S) 8.6-50 MG TABLET    Take 2 tablets by mouth daily as needed. For constipation   TRAMADOL (ULTRAM) 50 MG TABLET    Take 1 tablet (50 mg total) by mouth every 6 (six) hours as needed. As needed for pain  Modified Medications   No medications on file  Discontinued Medications   LEVOFLOXACIN (LEVAQUIN) 500 MG TABLET    Take 1 tablet (500 mg total) by mouth daily.

## 2014-07-03 NOTE — Patient Instructions (Signed)
Today we updated your med list in our EPIC system...    Continue your current medications the same...  We decided to add HUMIBID DM - one cap twice daily...    Continue to drink lots of fluids...  Remember your ANTIREFLUX regimen>>    Take your PROTONIX 40mg  about 30 min before dinner in the eve...    Do not eat or drink much after dinner...    Elevate the head of your bed on 6 inch blocks...  Today we did your follow up pulmonary function test and Oxygen study...    You passed both tests with flying colors!  Let's plan a follow up visit in 92mo, sooner if needed for breathing problems.Marland KitchenMarland Kitchen

## 2014-07-13 ENCOUNTER — Telehealth: Payer: Self-pay | Admitting: Pulmonary Disease

## 2014-07-13 NOTE — Telephone Encounter (Signed)
Called and spoke with pt and she is going to try the tylenol arthritis and see if this helps with her pain.  Wanted to call SN and make him aware. Nothing further is needed.

## 2014-07-17 ENCOUNTER — Ambulatory Visit: Payer: Medicare Other | Admitting: Physical Medicine & Rehabilitation

## 2014-07-19 ENCOUNTER — Telehealth: Payer: Self-pay | Admitting: Pulmonary Disease

## 2014-07-19 NOTE — Telephone Encounter (Signed)
Called spoke with pt. Made her aware PCP now is Dr. Jenny Reichmann and needs to call them. She will do so. Nothing further needed

## 2014-07-20 ENCOUNTER — Ambulatory Visit: Payer: Medicare Other | Admitting: Internal Medicine

## 2014-07-20 ENCOUNTER — Telehealth: Payer: Self-pay | Admitting: Internal Medicine

## 2014-07-20 ENCOUNTER — Encounter: Payer: Self-pay | Admitting: Internal Medicine

## 2014-07-20 ENCOUNTER — Ambulatory Visit (INDEPENDENT_AMBULATORY_CARE_PROVIDER_SITE_OTHER): Payer: Medicare Other | Admitting: Internal Medicine

## 2014-07-20 VITALS — BP 126/70 | HR 69 | Temp 98.1°F | Wt 135.5 lb

## 2014-07-20 DIAGNOSIS — I872 Venous insufficiency (chronic) (peripheral): Secondary | ICD-10-CM

## 2014-07-20 DIAGNOSIS — F411 Generalized anxiety disorder: Secondary | ICD-10-CM

## 2014-07-20 DIAGNOSIS — I10 Essential (primary) hypertension: Secondary | ICD-10-CM

## 2014-07-20 NOTE — Progress Notes (Signed)
Subjective:    Patient ID: Heather [name], female    DOB: 1918/05/13, 80 y.o.   MRN: [id number]  HPI   Here to f/u with one yr hx of ongoing right knee pain and right> left swelling, very nervous today as has been on previous exams, admits no change in pain or swelling.  Has known right knee DJD and varicose veins/venous insufficiency, has seen Dr Autumn Patty on several occasaions, this yr alone s/p cortisone x 2 to right knee, and has been prescribed compression stockings (but too difficult to put on herself).  Pt denies chest pain, increased sob or doe, wheezing, orthopnea, PND, increased LE swelling, palpitations, dizziness or syncope. Pt denies new neurological symptoms such as new headache, or facial or extremity weakness or numbness Past Medical History  Diagnosis Date  . RBBB (right bundle branch block with left anterior fascicular block)   . Hypertension   . Hiatal hernia   . Gastritis, chronic   . Breast cancer   . Degenerative joint disease   . Anxiety state, unspecified   . Low back pain   . Carpal tunnel syndrome   . Renal cyst   . Diverticula of colon   . Cervical spondylosis   . Diastolic dysfunction   . Venous insufficiency   . Constipation   . Colon polyp 12/19/2009    Transverse-polypoid colorectal mucosa  . Allergic rhinitis, cause unspecified 02/21/2014   Past Surgical History  Procedure Laterality Date  . Left mastectomy    . Carpal tunnel release      reports that she has never smoked. She has never used smokeless tobacco. She reports that she does not drink alcohol or use illicit drugs. family history is negative for Colon cancer. Allergies  Allergen Reactions  . Fentanyl     REACTION: nausea   Current Outpatient Prescriptions on File Prior to Visit  Medication Sig Dispense Refill  . ALPRAZolam (XANAX) 0.5 MG tablet Take 1/2 to 1 tablet by mouth three times daily as needed  90 tablet  5  . amLODipine (NORVASC) 2.5 MG tablet Take 1 tablet (2.5 mg  total) by mouth daily.  90 tablet  3  . aspirin EC 81 MG tablet Take 81 mg by mouth daily.      . carvedilol (COREG) 3.125 MG tablet Take 1 tablet (3.125 mg total) by mouth 2 (two) times daily.  180 tablet  3  . Cholecalciferol (VITAMIN D3) 1000 UNITS CAPS Take 1 capsule by mouth daily. For your bones      . furosemide (LASIX) 40 MG tablet Take 1 tablet (40 mg total) by mouth daily. For swelling  90 tablet  3  . guaiFENesin (MUCINEX) 600 MG 12 hr tablet Take 600 mg by mouth 4 (four) times daily as needed.      . Multiple Vitamins-Minerals (MULTIVITAMINS THER. W/MINERALS) TABS Take 1 tablet by mouth daily.      . pantoprazole (PROTONIX) 40 MG tablet Take 1 tablet (40 mg total) by mouth 2 (two) times daily.  60 tablet  11  . polyethylene glycol powder (GLYCOLAX) powder Take 17 g by mouth 2 (two) times daily.  527 g  1  . sennosides-docusate sodium (SENOKOT-S) 8.6-50 MG tablet Take 2 tablets by mouth daily as needed. For constipation      . traMADol (ULTRAM) 50 MG tablet Take 1 tablet (50 mg total) by mouth every 6 (six) hours as needed. As needed for pain  30 tablet  5   No  current facility-administered medications on file prior to visit.   Review of Systems All otherwise neg per pt     Objective:   Physical Exam BP 126/70  Pulse 69  Temp(Src) 98.1 F (36.7 C) (Oral)  Wt 135 lb 8 oz (61.462 kg)  SpO2 97% VS noted,  Constitutional: Pt appears well-developed, well-nourished.  HENT: Head: NCAT.  Right Ear: External ear normal.  Left Ear: External ear normal.  Eyes: . Pupils are equal, round, and reactive to light. Conjunctivae and EOM are normal Neck: Normal range of motion. Neck supple.  Cardiovascular: Normal rate and regular rhythm.   Pulmonary/Chest: Effort normal and breath sounds normal.  Abd:  Soft, NT, ND, + BS Neurological: Pt is alert. Not confused , motor grossly intact Right > left bony knee deg changes noted, NT, right 1+ effusion, both decreased ROM, and LE trace to 1+  edema right > left with mult varicosities Psychiatric: Pt behavior is normal. Mild agitation. 2+ nervous    Assessment & Plan:

## 2014-07-20 NOTE — Progress Notes (Signed)
Pre visit review using our clinic review tool, if applicable. No additional management support is needed unless otherwise documented below in the visit note. 

## 2014-07-20 NOTE — Telephone Encounter (Signed)
error 

## 2014-07-20 NOTE — Patient Instructions (Addendum)
Your leg swelling seems associated with the right knee swelling  OK to take tylenol arthritis as you mentioned - 1 every 8 hrs as needed  OK to stop the tramadol since it does not seem to work  Please see Dr Durward Fortes for the right knee pain and swelling, as the leg swelling is from this  Please continue all other medications as before, and refills have been done if requested.  Please have the pharmacy call with any other refills you may need.  Please keep your appointments with your specialists as you may have planned

## 2014-07-23 NOTE — Assessment & Plan Note (Signed)
Chronic without signfiicant change, cont same meds, compression stocking if able, leg elevation, low salt, wt control

## 2014-07-23 NOTE — Assessment & Plan Note (Signed)
stable overall by history and exam, recent data reviewed with pt, and pt to continue medical treatment as before,  to f/u any worsening symptoms or concerns BP Readings from Last 3 Encounters:  07/20/14 126/70  07/03/14 120/60  06/25/14 157/74

## 2014-07-23 NOTE — Assessment & Plan Note (Signed)
reasssurance again today, again declines further med tx, or referral for counsling

## 2014-07-24 ENCOUNTER — Encounter (HOSPITAL_COMMUNITY): Payer: Self-pay | Admitting: Emergency Medicine

## 2014-07-24 ENCOUNTER — Emergency Department (INDEPENDENT_AMBULATORY_CARE_PROVIDER_SITE_OTHER)
Admission: EM | Admit: 2014-07-24 | Discharge: 2014-07-24 | Disposition: A | Discharge status: Home / Self Care | Payer: Medicare Other | Source: Home / Self Care | Attending: Family Medicine | Admitting: Family Medicine

## 2014-07-24 DIAGNOSIS — G8929 Other chronic pain: Secondary | ICD-10-CM

## 2014-07-24 DIAGNOSIS — R109 Unspecified abdominal pain: Secondary | ICD-10-CM

## 2014-07-24 LAB — POCT URINALYSIS DIP (DEVICE)
Bilirubin Urine: NEGATIVE
GLUCOSE, UA: NEGATIVE mg/dL
Ketones, ur: NEGATIVE mg/dL
Leukocytes, UA: NEGATIVE
NITRITE: NEGATIVE
PROTEIN: NEGATIVE mg/dL
SPECIFIC GRAVITY, URINE: 1.01 (ref 1.005–1.030)
UROBILINOGEN UA: 0.2 mg/dL (ref 0.0–1.0)
pH: 7 (ref 5.0–8.0)

## 2014-07-24 MED ORDER — HYDROCODONE-ACETAMINOPHEN 5-325 MG PO TABS: 1.0000 | ORAL_TABLET | Freq: Four times a day (QID) | ORAL | Status: DC | PRN

## 2014-07-24 MED ORDER — TRAMADOL HCL 50 MG PO TABS: 50.0000 mg | ORAL_TABLET | Freq: Four times a day (QID) | ORAL | Status: DC | PRN

## 2014-07-24 NOTE — ED Notes (Signed)
C/o general body aches, abdominal pain, cough w occasional blood, perineal pain w BM's, weight loss

## 2014-07-24 NOTE — Discharge Instructions (Signed)
Abdominal Pain, Women °Abdominal (stomach, pelvic, or belly) pain can be caused by many things. It is important to tell your doctor: °· The location of the pain. °· Does it come and go or is it present all the time? °· Are there things that start the pain (eating certain foods, exercise)? °· Are there other symptoms associated with the pain (fever, nausea, vomiting, diarrhea)? °All of this is helpful to know when trying to find the cause of the pain. °CAUSES  °· Stomach: virus or bacteria infection, or ulcer. °· Intestine: appendicitis (inflamed appendix), regional ileitis (Crohn's disease), ulcerative colitis (inflamed colon), irritable bowel syndrome, diverticulitis (inflamed diverticulum of the colon), or cancer of the stomach or intestine. °· Gallbladder disease or stones in the gallbladder. °· Kidney disease, kidney stones, or infection. °· Pancreas infection or cancer. °· Fibromyalgia (pain disorder). °· Diseases of the female organs: °¨ Uterus: fibroid (non-cancerous) tumors or infection. °¨ Fallopian tubes: infection or tubal pregnancy. °¨ Ovary: cysts or tumors. °¨ Pelvic adhesions (scar tissue). °¨ Endometriosis (uterus lining tissue growing in the pelvis and on the pelvic organs). °¨ Pelvic congestion syndrome (female organs filling up with blood just before the menstrual period). °¨ Pain with the menstrual period. °¨ Pain with ovulation (producing an egg). °¨ Pain with an IUD (intrauterine device, birth control) in the uterus. °¨ Cancer of the female organs. °· Functional pain (pain not caused by a disease, may improve without treatment). °· Psychological pain. °· Depression. °DIAGNOSIS  °Your doctor will decide the seriousness of your pain by doing an examination. °· Blood tests. °· X-rays. °· Ultrasound. °· CT scan (computed tomography, special type of X-ray). °· MRI (magnetic resonance imaging). °· Cultures, for infection. °· Barium enema (dye inserted in the large intestine, to better view it with  X-rays). °· Colonoscopy (looking in intestine with a lighted tube). °· Laparoscopy (minor surgery, looking in abdomen with a lighted tube). °· Major abdominal exploratory surgery (looking in abdomen with a large incision). °TREATMENT  °The treatment will depend on the cause of the pain.  °· Many cases can be observed and treated at home. °· Over-the-counter medicines recommended by your caregiver. °· Prescription medicine. °· Antibiotics, for infection. °· Birth control pills, for painful periods or for ovulation pain. °· Hormone treatment, for endometriosis. °· Nerve blocking injections. °· Physical therapy. °· Antidepressants. °· Counseling with a psychologist or psychiatrist. °· Minor or major surgery. °HOME CARE INSTRUCTIONS  °· Do not take laxatives, unless directed by your caregiver. °· Take over-the-counter pain medicine only if ordered by your caregiver. Do not take aspirin because it can cause an upset stomach or bleeding. °· Try a clear liquid diet (broth or water) as ordered by your caregiver. Slowly move to a bland diet, as tolerated, if the pain is related to the stomach or intestine. °· Have a thermometer and take your temperature several times a day, and record it. °· Bed rest and sleep, if it helps the pain. °· Avoid sexual intercourse, if it causes pain. °· Avoid stressful situations. °· Keep your follow-up appointments and tests, as your caregiver orders. °· If the pain does not go away with medicine or surgery, you may try: °¨ Acupuncture. °¨ Relaxation exercises (yoga, meditation). °¨ Group therapy. °¨ Counseling. °SEEK MEDICAL CARE IF:  °· You notice certain foods cause stomach pain. °· Your home care treatment is not helping your pain. °· You need stronger pain medicine. °· You want your IUD removed. °· You feel faint or   lightheaded. °· You develop nausea and vomiting. °· You develop a rash. °· You are having side effects or an allergy to your medicine. °SEEK IMMEDIATE MEDICAL CARE IF:  °· Your  pain does not go away or gets worse. °· You have a fever. °· Your pain is felt only in portions of the abdomen. The right side could possibly be appendicitis. The left lower portion of the abdomen could be colitis or diverticulitis. °· You are passing blood in your stools (bright red or black tarry stools, with or without vomiting). °· You have blood in your urine. °· You develop chills, with or without a fever. °· You pass out. °MAKE SURE YOU:  °· Understand these instructions. °· Will watch your condition. °· Will get help right away if you are not doing well or get worse. °Document Released: 08/31/2007 Document Revised: 03/20/2014 Document Reviewed: 09/20/2009 °[employer]® Patient Information ©2015 [employer]. This information is not intended to replace advice given to you by your health care provider. Make sure you discuss any questions you have with your health care provider. ° °

## 2014-07-24 NOTE — ED Provider Notes (Signed)
CSN: [id number]     Arrival date & time 07/24/14  1442 History   First MD Initiated Contact with Patient 07/24/14 1526     Chief Complaint  Patient presents with  . Generalized Body Aches   (Consider location/radiation/quality/duration/timing/severity/associated sxs/prior Treatment) HPI Comments: 80 year old female with history of chronic left lower quadrant abdominal pain presents complaining of left lower quadrant abdominal pain. She has had this for about 2 years. It has been evaluated by her primary care doctor in by a gastroenterologist. The pain is not any worse today, she says she just has gotten tired of it and she wants to be seen again. She was told by her gastroenterologist to call back if the pain does not go away but she decided to come here instead. She also has about a 50 pound unexplained weight loss in the past 9 months that she is concerned about. She also complains of chronic cough, chronic body aches, excessive mucus production, and various other complaints.   Past Medical History  Diagnosis Date  . RBBB (right bundle branch block with left anterior fascicular block)   . Hypertension   . Hiatal hernia   . Gastritis, chronic   . Breast cancer   . Degenerative joint disease   . Anxiety state, unspecified   . Low back pain   . Carpal tunnel syndrome   . Renal cyst   . Diverticula of colon   . Cervical spondylosis   . Diastolic dysfunction   . Venous insufficiency   . Constipation   . Colon polyp 12/19/2009    Transverse-polypoid colorectal mucosa  . Allergic rhinitis, cause unspecified 02/21/2014   Past Surgical History  Procedure Laterality Date  . Left mastectomy    . Carpal tunnel release     Family History  Problem Relation Age of Onset  . Colon cancer Neg Hx    History  Substance Use Topics  . Smoking status: Never Smoker   . Smokeless tobacco: Never Used  . Alcohol Use: No   OB History   Grav Para Term Preterm Abortions TAB SAB Ect Mult Living                  Review of Systems  Constitutional: Positive for unexpected weight change. Negative for fever and chills.       Feels cold sometimes  HENT: Positive for congestion.   Respiratory: Positive for cough. Negative for chest tightness, shortness of breath and wheezing.   Cardiovascular: Positive for leg swelling (chronic). Negative for chest pain.  Gastrointestinal: Positive for vomiting (occasionally, about once per week ) and abdominal pain. Negative for nausea and diarrhea.  Musculoskeletal: Positive for myalgias.  All other systems reviewed and are negative.   Allergies  Fentanyl  Home Medications   Prior to Admission medications   Medication Sig Start Date End Date Taking? Authorizing Provider  ALPRAZolam Duanne Moron) 0.5 MG tablet Take 1/2 to 1 tablet by mouth three times daily as needed 02/03/14   Biagio Borg, MD  amLODipine (NORVASC) 2.5 MG tablet Take 1 tablet (2.5 mg total) by mouth daily. 02/02/14   Biagio Borg, MD  aspirin EC 81 MG tablet Take 81 mg by mouth daily.    Historical Provider, MD  carvedilol (COREG) 3.125 MG tablet Take 1 tablet (3.125 mg total) by mouth 2 (two) times daily. 02/02/14   Biagio Borg, MD  Cholecalciferol (VITAMIN D3) 1000 UNITS CAPS Take 1 capsule by mouth daily. For your bones  Historical Provider, MD  furosemide (LASIX) 40 MG tablet Take 1 tablet (40 mg total) by mouth daily. For swelling 02/02/14   Biagio Borg, MD  guaiFENesin (MUCINEX) 600 MG 12 hr tablet Take 600 mg by mouth 4 (four) times daily as needed.    Historical Provider, MD  HYDROcodone-acetaminophen (NORCO) 5-325 MG per tablet Take 1 tablet by mouth every 6 (six) hours as needed for severe pain. 07/24/14   Liam Graham, PA-C  Multiple Vitamins-Minerals (MULTIVITAMINS THER. W/MINERALS) TABS Take 1 tablet by mouth daily.    Historical Provider, MD  pantoprazole (PROTONIX) 40 MG tablet Take 1 tablet (40 mg total) by mouth 2 (two) times daily. 02/21/14   Biagio Borg, MD  polyethylene  glycol powder [facility]) powder Take 17 g by mouth 2 (two) times daily. 06/22/13   Willia Craze, NP  sennosides-docusate sodium (SENOKOT-S) 8.6-50 MG tablet Take 2 tablets by mouth daily as needed. For constipation    Historical Provider, MD  traMADol (ULTRAM) 50 MG tablet Take 1 tablet (50 mg total) by mouth every 6 (six) hours as needed. As needed for pain 02/03/14   Biagio Borg, MD   BP 150/79  Pulse 74  Temp(Src) 98.1 F (36.7 C) (Oral)  Resp 18  SpO2 98% Physical Exam  Nursing note and vitals reviewed. Constitutional: She is oriented to person, place, and time. Vital signs are normal. She appears well-developed and well-nourished. No distress.  HENT:  Head: Normocephalic and atraumatic.  Cardiovascular: Normal rate, regular rhythm, normal heart sounds and intact distal pulses.  Exam reveals no gallop and no friction rub.   No murmur heard. Pulmonary/Chest: Effort normal and breath sounds normal. No respiratory distress. She has no wheezes. She has no rales. She exhibits no tenderness.  Abdominal: Soft. Normal appearance and bowel sounds are normal. She exhibits no distension, no fluid wave and no mass. There is no hepatosplenomegaly. There is tenderness in the suprapubic area and left lower quadrant. There is no rigidity, no rebound, no guarding, no CVA tenderness, no tenderness at McBurney's point and negative Murphy's sign.  Neurological: She is alert and oriented to person, place, and time. She has normal strength. Coordination normal.  Skin: Skin is warm and dry. No rash noted. She is not diaphoretic.  Psychiatric: She has a normal mood and affect. Judgment normal.    ED Course  Procedures (including critical care time) Labs Review Labs Reviewed  POCT URINALYSIS DIP (DEVICE) - Abnormal; Notable for the following:    Hgb urine dipstick TRACE (*)    All other components within normal limits    Imaging Review No results found.   MDM   1. Chronic abdominal pain     Highly doubt any acute problem given chronicity. Will refer to gastroenterology, she is not happy with the gastroenterologist she has seen, we'll give her another referral for second opinion.      Meds ordered this encounter  Medications  . DISCONTD: traMADol (ULTRAM) 50 MG tablet    Sig: Take 1 tablet (50 mg total) by mouth every 6 (six) hours as needed for moderate pain.    Dispense:  30 tablet    Refill:  0    Order Specific Question:  Supervising Provider    Answer:  Jake Michaelis, DAVID C [id number]  . HYDROcodone-acetaminophen (NORCO) 5-325 MG per tablet    Sig: Take 1 tablet by mouth every 6 (six) hours as needed for severe pain.    Dispense:  20 tablet  Refill:  0    Order Specific Question:  Supervising Provider    Answer:  Jake Michaelis, DAVID C [place], PA-C 07/24/14 1752

## 2014-07-26 NOTE — ED Provider Notes (Signed)
Medical screening examination/treatment/procedure(s) were performed by a resident physician or non-physician practitioner and as the supervising physician I was immediately available for consultation/collaboration.  Linna Darner, MD Family Medicine   Waldemar Dickens, MD 07/26/14 2025

## 2014-08-03 ENCOUNTER — Encounter: Payer: Self-pay | Admitting: Internal Medicine

## 2014-08-03 ENCOUNTER — Ambulatory Visit (INDEPENDENT_AMBULATORY_CARE_PROVIDER_SITE_OTHER): Payer: Medicare Other | Admitting: Internal Medicine

## 2014-08-03 VITALS — BP 140/70 | HR 84 | Temp 97.9°F | Wt 138.2 lb

## 2014-08-03 DIAGNOSIS — R634 Abnormal weight loss: Secondary | ICD-10-CM

## 2014-08-03 DIAGNOSIS — F411 Generalized anxiety disorder: Secondary | ICD-10-CM

## 2014-08-03 DIAGNOSIS — I10 Essential (primary) hypertension: Secondary | ICD-10-CM

## 2014-08-03 MED ORDER — CITALOPRAM HYDROBROMIDE 10 MG PO TABS: 10.0000 mg | ORAL_TABLET | Freq: Every day | ORAL | Status: DC

## 2014-08-03 NOTE — Progress Notes (Signed)
Pre visit review using our clinic review tool, if applicable. No additional management support is needed unless otherwise documented below in the visit note. 

## 2014-08-03 NOTE — Progress Notes (Signed)
Subjective:    Patient ID: Heather [name], female    DOB: 03-24-1918, 80 y.o.   MRN: [id number]  HPI  Here for wellness and f/u;  Overall doing ok;  Pt denies CP, worsening SOB, DOE, wheezing, orthopnea, PND, worsening LE edema, palpitations, dizziness or syncope.  Pt denies neurological change such as new headache, facial or extremity weakness.  Pt denies polydipsia, polyuria, or low sugar symptoms. Pt states overall good compliance with treatment and medications, good tolerability, and has been trying to follow lower cholesterol diet.  Pt denies worsening depressive symptoms, suicidal ideation or panic. No fever, night sweats, wt loss, loss of appetite, or other constitutional symptoms.  Pt states good ability with ADL's, has low fall risk, home safety reviewed and adequate, no other significant changes in hearing or vision, and only occasionally active with exercise.  Has significant throat congestion, wakes her up nightly with prod cough, small blood noted.  Decline prevnar for today due to severe anxiety today. Very worried about wt loss today -  Wt Readings from Last 3 Encounters:  08/03/14 138 lb 4 oz (62.71 kg)  07/20/14 135 lb 8 oz (61.462 kg)  07/03/14 141 lb 12.8 oz (64.32 kg)    Past Medical History  Diagnosis Date  . RBBB (right bundle branch block with left anterior fascicular block)   . Hypertension   . Hiatal hernia   . Gastritis, chronic   . Breast cancer   . Degenerative joint disease   . Anxiety state, unspecified   . Low back pain   . Carpal tunnel syndrome   . Renal cyst   . Diverticula of colon   . Cervical spondylosis   . Diastolic dysfunction   . Venous insufficiency   . Constipation   . Colon polyp 12/19/2009    Transverse-polypoid colorectal mucosa  . Allergic rhinitis, cause unspecified 02/21/2014   Past Surgical History  Procedure Laterality Date  . Left mastectomy    . Carpal tunnel release      reports that she has never smoked. She has never used  smokeless tobacco. She reports that she does not drink alcohol or use illicit drugs. family history is negative for Colon cancer. Allergies  Allergen Reactions  . Fentanyl     REACTION: nausea   Current Outpatient Prescriptions on File Prior to Visit  Medication Sig Dispense Refill  . ALPRAZolam (XANAX) 0.5 MG tablet Take 1/2 to 1 tablet by mouth three times daily as needed  90 tablet  5  . amLODipine (NORVASC) 2.5 MG tablet Take 1 tablet (2.5 mg total) by mouth daily.  90 tablet  3  . aspirin EC 81 MG tablet Take 81 mg by mouth daily.      . carvedilol (COREG) 3.125 MG tablet Take 1 tablet (3.125 mg total) by mouth 2 (two) times daily.  180 tablet  3  . Cholecalciferol (VITAMIN D3) 1000 UNITS CAPS Take 1 capsule by mouth daily. For your bones      . furosemide (LASIX) 40 MG tablet Take 1 tablet (40 mg total) by mouth daily. For swelling  90 tablet  3  . guaiFENesin (MUCINEX) 600 MG 12 hr tablet Take 600 mg by mouth 4 (four) times daily as needed.      Marland Kitchen HYDROcodone-acetaminophen (NORCO) 5-325 MG per tablet Take 1 tablet by mouth every 6 (six) hours as needed for severe pain.  20 tablet  0  . Multiple Vitamins-Minerals (MULTIVITAMINS THER. W/MINERALS) TABS Take 1 tablet by  mouth daily.      . pantoprazole (PROTONIX) 40 MG tablet Take 1 tablet (40 mg total) by mouth 2 (two) times daily.  60 tablet  11  . polyethylene glycol powder (GLYCOLAX) powder Take 17 g by mouth 2 (two) times daily.  527 g  1  . sennosides-docusate sodium (SENOKOT-S) 8.6-50 MG tablet Take 2 tablets by mouth daily as needed. For constipation       No current facility-administered medications on file prior to visit.    Review of Systems Constitutional: Negative for increased diaphoresis, other activity, appetite or other siginficant weight change  HENT: Negative for worsening hearing loss, ear pain, facial swelling, mouth sores and neck stiffness.   Eyes: Negative for other worsening pain, redness or visual disturbance.   Respiratory: Negative for shortness of breath and wheezing.   Cardiovascular: Negative for chest pain and palpitations.  Gastrointestinal: Negative for diarrhea, blood in stool, abdominal distention or other pain Genitourinary: Negative for hematuria, flank pain or change in urine volume.  Musculoskeletal: Negative for myalgias or other joint complaints.  Skin: Negative for color change and wound.  Neurological: Negative for syncope and numbness. other than noted Hematological: Negative for adenopathy. or other swelling Psychiatric/Behavioral: Negative for hallucinations, self-injury, decreased concentration or other worsening agitation.      Objective:   Physical Exam BP 140/70  Pulse 84  Temp(Src) 97.9 F (36.6 C) (Oral)  Wt 138 lb 4 oz (62.71 kg)  SpO2 97% VS noted,  Constitutional: Pt is oriented to person, place, and time. Appears well-developed and well-nourished.  Head: Normocephalic and atraumatic.  Right Ear: External ear normal.  Left Ear: External ear normal.  Nose: Nose normal.  Mouth/Throat: Oropharynx is clear and moist.  Eyes: Conjunctivae and EOM are normal. Pupils are equal, round, and reactive to light.  Neck: Normal range of motion. Neck supple. No JVD present. No tracheal deviation present.  Cardiovascular: Normal rate, regular rhythm, normal heart sounds and intact distal pulses.   Pulmonary/Chest: Effort normal and breath sounds without rales or wheezing  Abdominal: Soft. Bowel sounds are normal. NT. No HSM  Musculoskeletal: Normal range of motion. Exhibits no edema.  Lymphadenopathy:  Has no cervical adenopathy.  Neurological: Pt is alert and oriented to person, place, and time. Pt has normal reflexes. No cranial nerve deficit. Motor grossly intact Skin: Skin is warm and dry. No rash noted.  Psychiatric:  Has marked nervous mood and affect. Behavior is normal.     Assessment & Plan:

## 2014-08-03 NOTE — Patient Instructions (Signed)
Please take all new medication as prescribed - the depression medication (citalpram)  Please continue all other medications as before, and refills have been done if requested.  Please have the pharmacy call with any other refills you may need.  Please continue your efforts at being more active, low cholesterol diet, and weight control.  You are otherwise up to date with prevention measures today.  Please keep your appointments with your specialists as you may have planned  Please return in 6 months, or sooner if needed

## 2014-08-06 NOTE — Assessment & Plan Note (Signed)
stable overall by history and exam, recent data reviewed with pt, and pt to continue medical treatment as before,  to f/u any worsening symptoms or concerns BP Readings from Last 3 Encounters:  08/03/14 140/70  07/24/14 150/79  07/20/14 126/70

## 2014-08-06 NOTE — Assessment & Plan Note (Signed)
Ongoing mod to severe, for trial celexa 10 qd,  to f/u any worsening symptoms or concerns

## 2014-08-06 NOTE — Assessment & Plan Note (Signed)
Has lost 3 lbs since 1 mo, not cleer if significant, encouraged ensure

## 2014-08-10 ENCOUNTER — Ambulatory Visit (INDEPENDENT_AMBULATORY_CARE_PROVIDER_SITE_OTHER): Payer: Medicare Other | Admitting: Cardiovascular Disease

## 2014-08-10 VITALS — BP 138/66 | HR 81 | Ht 61.0 in | Wt 137.0 lb

## 2014-08-10 DIAGNOSIS — I10 Essential (primary) hypertension: Secondary | ICD-10-CM

## 2014-08-10 DIAGNOSIS — I872 Venous insufficiency (chronic) (peripheral): Secondary | ICD-10-CM

## 2014-08-10 DIAGNOSIS — K5730 Diverticulosis of large intestine without perforation or abscess without bleeding: Secondary | ICD-10-CM

## 2014-08-10 NOTE — Assessment & Plan Note (Signed)
Well controlled.  Continue current medications and low sodium Dash type diet.    

## 2014-08-10 NOTE — Patient Instructions (Signed)
Your physician wants you to follow-up in: YEAR WITH DR NISHAN  You will receive a reminder letter in the mail two months in advance. If you don't receive a letter, please call our office to schedule the follow-up appointment.  Your physician recommends that you continue on your current medications as directed. Please refer to the Current Medication list given to you today. 

## 2014-08-10 NOTE — Assessment & Plan Note (Signed)
Wearing support hose continue current diuretic dose  Low sodium diet

## 2014-08-10 NOTE — Progress Notes (Signed)
Patient ID: Heather [name], female   DOB: December 15, 1917, 80 y.o.   MRN: [id number] Heather [name] is an 80 year old pateint of Dr Jeffie Pollock last seen 10/11 with a history of congestive heart failure secondary to diastolic dysfunction, hypertension, atypical chest pain with negative Myoviews in 2007 and 2009, breast cancer status post left mastectomy Chronic phlem and cough with white mucos. On mucinex but fixated on this issue. No recent dyspnea or CHF. Recently seen by Dr Oletta Lamas GI with no recommendations. Is on prilosec She is very functional Has some LE edema on left leg from knee brace Also wearing back brace Sees [facility] for ortho  Complains of LLQ pain but CT negative Chronic edema Still with complaints about mucus    ROS: Denies fever, malais, weight loss, blurry vision, decreased visual acuity, cough, sputum, SOB, hemoptysis, pleuritic pain, palpitaitons, heartburn, abdominal pain, melena, lower extremity edema, claudication, or rash.  All other systems reviewed and negative  General: Affect appropriate Healthy:  appears stated age 80: normal Neck supple with no adenopathy JVP normal no bruits no thyromegaly Lungs clear with no wheezing and good diaphragmatic motion Heart:  S1/S2 no murmur, no rub, gallop or click PMI normal Abdomen: benighn, BS positve, no tenderness, no AAA no bruit.  No HSM or HJR Distal pulses intact with no bruits Plus one bilateal  edema Neuro non-focal Skin warm and dry No muscular weakness   Current Outpatient Prescriptions  Medication Sig Dispense Refill  . ALPRAZolam (XANAX) 0.5 MG tablet Take 1/2 to 1 tablet by mouth three times daily as needed  90 tablet  5  . amLODipine (NORVASC) 2.5 MG tablet Take 1 tablet (2.5 mg total) by mouth daily.  90 tablet  3  . aspirin EC 81 MG tablet Take 81 mg by mouth daily.      . carvedilol (COREG) 3.125 MG tablet Take 1 tablet (3.125 mg total) by mouth 2 (two) times daily.  180 tablet  3  . Cholecalciferol  (VITAMIN D3) 1000 UNITS CAPS Take 1 capsule by mouth daily. For your bones      . citalopram (CELEXA) 10 MG tablet Take 1 tablet (10 mg total) by mouth daily.  90 tablet  3  . furosemide (LASIX) 40 MG tablet Take 1 tablet (40 mg total) by mouth daily. For swelling  90 tablet  3  . guaiFENesin (MUCINEX) 600 MG 12 hr tablet Take 600 mg by mouth 4 (four) times daily as needed.      Marland Kitchen HYDROcodone-acetaminophen (NORCO) 5-325 MG per tablet Take 1 tablet by mouth every 6 (six) hours as needed for severe pain.  20 tablet  0  . Multiple Vitamins-Minerals (MULTIVITAMINS THER. W/MINERALS) TABS Take 1 tablet by mouth daily.      . pantoprazole (PROTONIX) 40 MG tablet Take 1 tablet (40 mg total) by mouth 2 (two) times daily.  60 tablet  11  . polyethylene glycol powder (GLYCOLAX) powder Take 17 g by mouth 2 (two) times daily.  527 g  1  . sennosides-docusate sodium (SENOKOT-S) 8.6-50 MG tablet Take 2 tablets by mouth daily as needed. For constipation       No current facility-administered medications for this visit.    Allergies  Fentanyl  Electrocardiogram:  SR rate 81  RBBB LAFB   Assessment and Plan

## 2014-08-10 NOTE — Assessment & Plan Note (Signed)
May be responsible for LLQ pain but no evidence of inflammation f/u Dr Fuller Plan

## 2014-09-04 ENCOUNTER — Telehealth: Payer: Self-pay | Admitting: Pulmonary Disease

## 2014-09-04 MED ORDER — AZITHROMYCIN 250 MG PO TABS: 250.0000 mg | ORAL_TABLET | ORAL | Status: DC

## 2014-09-04 NOTE — Telephone Encounter (Addendum)
Spoke with pt-- c/o increased mucus. Pt reports coughing up very thick and very lumpy bitter tasting sputum.  Pt states that she has to force this out of her lungs because it is so thick.  Pt reports the mucus being white and cream colored.  Pt concerned of her weight loss, pt now weighs 120lb-- 20# loss since 06/2014 OV Denies CP/tightness, SOB Denies using Mucinex for congestion.  Pt requests rec's.  Please advise Dr Melvyn Novas as Dr Lenna Gilford is gone for the afternoon. Thanks.  Allergies  Allergen Reactions  . Fentanyl     REACTION: nausea   , Current Outpatient Prescriptions on File Prior to Visit  Medication Sig Dispense Refill  . ALPRAZolam (XANAX) 0.5 MG tablet Take 1/2 to 1 tablet by mouth three times daily as needed  90 tablet  5  . amLODipine (NORVASC) 2.5 MG tablet Take 1 tablet (2.5 mg total) by mouth daily.  90 tablet  3  . aspirin EC 81 MG tablet Take 81 mg by mouth daily.      . carvedilol (COREG) 3.125 MG tablet Take 1 tablet (3.125 mg total) by mouth 2 (two) times daily.  180 tablet  3  . Cholecalciferol (VITAMIN D3) 1000 UNITS CAPS Take 1 capsule by mouth daily. For your bones      . citalopram (CELEXA) 10 MG tablet Take 1 tablet (10 mg total) by mouth daily.  90 tablet  3  . furosemide (LASIX) 40 MG tablet Take 1 tablet (40 mg total) by mouth daily. For swelling  90 tablet  3  . guaiFENesin (MUCINEX) 600 MG 12 hr tablet Take 600 mg by mouth 4 (four) times daily as needed.      Marland Kitchen HYDROcodone-acetaminophen (NORCO) 5-325 MG per tablet Take 1 tablet by mouth every 6 (six) hours as needed for severe pain.  20 tablet  0  . Multiple Vitamins-Minerals (MULTIVITAMINS THER. W/MINERALS) TABS Take 1 tablet by mouth daily.      . pantoprazole (PROTONIX) 40 MG tablet Take 1 tablet (40 mg total) by mouth 2 (two) times daily.  60 tablet  11  . polyethylene glycol powder (GLYCOLAX) powder Take 17 g by mouth 2 (two) times daily.  527 g  1  . sennosides-docusate sodium (SENOKOT-S) 8.6-50 MG tablet  Take 2 tablets by mouth daily as needed. For constipation       No current facility-administered medications on file prior to visit.

## 2014-09-04 NOTE — Telephone Encounter (Signed)
zpak and mucinex up 1200 mg every 12 hours as needed and if not rapidly improving needs ov

## 2014-09-04 NOTE — Telephone Encounter (Signed)
Pt aware of rec's per MW. Pt to pick up ZPAK @ [facility] and to take Mucinex 1200mg  as directed PRN Nothing further needed.

## 2014-09-18 ENCOUNTER — Telehealth: Payer: Self-pay | Admitting: Pulmonary Disease

## 2014-09-18 NOTE — Telephone Encounter (Signed)
ATC x 2 busy signal. wcb 

## 2014-09-18 NOTE — Telephone Encounter (Signed)
Per SN---  SN can see her for the cough, blood tinged sputum, but she will need to see Dr. Ronnald Ramp for recheck for all other complaints.  thanks

## 2014-09-18 NOTE — Telephone Encounter (Signed)
Pt states she is coughing up thick mucus streaked with dark blood X4 months.  Pt takes mucinex to help thin mucus.  Pt also having difficulty having a bowel movement when taking the medication, feeling bloated, weight loss.  Pt believes she needs a new ct scan of the chest.  States she took a sample of mucus to Dr. Jenny Reichmann but he said that SN's the doctor who would work with this.  Last seen 07/03/14. No rov.    SN please advise.  Thank you.   Allergies  Allergen Reactions  . Fentanyl     REACTION: nausea   Current Outpatient Prescriptions on File Prior to Visit  Medication Sig Dispense Refill  . ALPRAZolam (XANAX) 0.5 MG tablet Take 1/2 to 1 tablet by mouth three times daily as needed 90 tablet 5  . amLODipine (NORVASC) 2.5 MG tablet Take 1 tablet (2.5 mg total) by mouth daily. 90 tablet 3  . aspirin EC 81 MG tablet Take 81 mg by mouth daily.    Marland Kitchen azithromycin (ZITHROMAX) 250 MG tablet Take 1 tablet (250 mg total) by mouth as directed. 6 tablet 0  . carvedilol (COREG) 3.125 MG tablet Take 1 tablet (3.125 mg total) by mouth 2 (two) times daily. 180 tablet 3  . Cholecalciferol (VITAMIN D3) 1000 UNITS CAPS Take 1 capsule by mouth daily. For your bones    . citalopram (CELEXA) 10 MG tablet Take 1 tablet (10 mg total) by mouth daily. 90 tablet 3  . furosemide (LASIX) 40 MG tablet Take 1 tablet (40 mg total) by mouth daily. For swelling 90 tablet 3  . guaiFENesin (MUCINEX) 600 MG 12 hr tablet Take 600 mg by mouth 4 (four) times daily as needed.    Marland Kitchen HYDROcodone-acetaminophen (NORCO) 5-325 MG per tablet Take 1 tablet by mouth every 6 (six) hours as needed for severe pain. 20 tablet 0  . Multiple Vitamins-Minerals (MULTIVITAMINS THER. W/MINERALS) TABS Take 1 tablet by mouth daily.    . pantoprazole (PROTONIX) 40 MG tablet Take 1 tablet (40 mg total) by mouth 2 (two) times daily. 60 tablet 11  . polyethylene glycol powder (GLYCOLAX) powder Take 17 g by mouth 2 (two) times daily. 527 g 1  .  sennosides-docusate sodium (SENOKOT-S) 8.6-50 MG tablet Take 2 tablets by mouth daily as needed. For constipation     No current facility-administered medications on file prior to visit.

## 2014-09-19 NOTE — Telephone Encounter (Signed)
Spoke with and advised that Dr Lenna Gilford can see her for cough but she will need to discuss other issues with Dr Jenny Reichmann.  Pt requested appt with Dr Lenna Gilford.  Scheduled 09/21/14 with Dr Lenna Gilford.

## 2014-09-21 ENCOUNTER — Encounter: Payer: Self-pay | Admitting: Pulmonary Disease

## 2014-09-21 ENCOUNTER — Other Ambulatory Visit: Payer: Self-pay | Admitting: Internal Medicine

## 2014-09-21 ENCOUNTER — Ambulatory Visit (INDEPENDENT_AMBULATORY_CARE_PROVIDER_SITE_OTHER): Payer: Medicare Other | Admitting: Pulmonary Disease

## 2014-09-21 VITALS — BP 112/60 | HR 71 | Temp 97.9°F | Ht 62.0 in | Wt 135.0 lb

## 2014-09-21 DIAGNOSIS — J411 Mucopurulent chronic bronchitis: Secondary | ICD-10-CM

## 2014-09-21 DIAGNOSIS — K219 Gastro-esophageal reflux disease without esophagitis: Secondary | ICD-10-CM

## 2014-09-21 DIAGNOSIS — J302 Other seasonal allergic rhinitis: Secondary | ICD-10-CM

## 2014-09-21 DIAGNOSIS — F411 Generalized anxiety disorder: Secondary | ICD-10-CM

## 2014-09-21 DIAGNOSIS — K449 Diaphragmatic hernia without obstruction or gangrene: Secondary | ICD-10-CM

## 2014-09-21 DIAGNOSIS — F450 Somatization disorder: Secondary | ICD-10-CM

## 2014-09-21 DIAGNOSIS — J387 Other diseases of larynx: Secondary | ICD-10-CM

## 2014-09-21 MED ORDER — PANTOPRAZOLE SODIUM 40 MG PO TBEC: 40.0000 mg | DELAYED_RELEASE_TABLET | Freq: Two times a day (BID) | ORAL | Status: DC

## 2014-09-21 MED ORDER — HYDROCODONE-ACETAMINOPHEN 5-325 MG PO TABS: 1.0000 | ORAL_TABLET | Freq: Four times a day (QID) | ORAL | Status: DC | PRN

## 2014-09-21 NOTE — Telephone Encounter (Signed)
Done hardcopy to robin  

## 2014-09-21 NOTE — Patient Instructions (Signed)
Today we updated your med list in our EPIC system...    Continue your current medications the same...  Remember to take your PROTONIX 40mg  twice daily every day (30 min before the 1st & last meals of the day...  Do not eat or drink after dinner in the evening...  Elevate the head of your bed 6+inches  Continue the Mucinex 1-2 twice daily w/ fluids.Marland KitchenMarland Kitchen

## 2014-09-21 NOTE — Progress Notes (Signed)
Subjective:    Patient ID: Heather [name], female    DOB: [birth year], 80 y.o.   MRN: [id number]  HPI 80 y/o Heather [name] here for a follow up visit... Heather [name] has multiple medical problems including:  Dyspnea & Anxiety;  HBP;  Hx CP followed by Heather [name];  HH/ Gastritis;  Divertics/ Constip;  Functional Abd Pain;  Renal cyst followed by Heather [name];  Hx left breast [phone];  DJD/ CSpine spondy/ LBP/ CTS; and Heather [name] has a cancer phobia...   ~  SEE PREV EPIC NOTES FOR OLDER DATA >> Heather [name] has established Primary Care follow up w/ Heather [name],  [place]...   ~ September 27, 2012: 173moROV & DEtheleneis rambling on w/ 'flight of ideas" today & due to this we tried a systems approach & touched on all these problems during her visit today>>     Resp symptoms> c/o SOB, hocking phlegm, "this throat business" for which Heather [name] has seen Heather [name], ENT; now c/o decr hearing, thick phlegm, etc; We reviewed CXR 10/13- clear & wnl; PFT 10/13- normal airflow; Rec- Mucinex6077m2Bid, lots of fluids, & f/u w/ ENT for hearing eval...     HBP> on Coreg3.125Bid, Amlod2.5, Lisin40; BP= 120/64 & we reviewed labs from 10/13- all wnl...     HxCP> on ASA81; followed by Heather [name]/ Heather [name] [facility] & their notes are reviewed=> neg ischemic work up...     VI, Edema> Heather [name] went to the ER 11/13 w/ edema; VDopplers were neg for DVT; they reiterated the recs for no salt, elevation, support hose, & continue Lasix40.     GI- HH, Gastirits, Divertics, chronic pain & "swelling"> on Omep20- taking 2/d & we discussed derc to one daily; plus Miralax & Senakot-S...     Renal cyst> Heather [name] is very anxious about everything & has a cancer phobia; Heather [name] followed this benign simple cyst for yrs- no change...     Hx Breast cancer> Heather [name] had left mod radical mastectomy 1994 by Heather [name]- no known recurrence & Heather [name] is given reassurance...     Ortho- DJD, Cspine spongy, LBP, CTS> followed by Heather [name]; on Vicodin & Tramadol but prefers the latter Heather [name] says...    Anxiety> on Alpraz0.67m4md but "I'm not taking it very often" Heather [name] says & encouraged to use more regularly...  We reviewed prob list, meds, xrays and labs> see below for updates >>   ~  October 27, 2012:  73mo52mo & [name] has mult somatic complaints as usual- dry mouth at night, hocking thick phlegm, c/o stomach/ back/ etc... Again we reviewed the following problems during today's visit>>    Resp symptoms> on Mucinex2Bid +Fluids; Heather [name] saw Heather [name], ENT 11/13- dysphagia, presbycusis, referral otalgia; exam was normal, barium swallow 11/13 showed diminished primary peristaltic wave, mild tertiary contractions, prominent cricopharyngeus muscle, no hiatal hernia/ no reflux/ barium pill passed into the stomach without delay...    HBP> on Coreg3.125Bid, Amlod2.5, Lisin40; BP= 120/60 & Heather [name] persists w/ mult somatic complaints...    HxCP> on ASA81; followed by Heather [name]/ Heather [name] for Cards & their notes are reviewed=> neg ischemic work up previously...     VI, Edema> Heather [name] went to the ER 11/13 w/ edema; VDopplers were neg for DVT; they reiterated the recs for no salt, elevation, support hose, & continue Lasix40.     GI- HH, Gastirits, Divertics, chronic pain & "swelling"> on Omep20mg567mlus Miralax & Senakot-S; Heather [name] states "my stomach has gone to my back".    Renal cyst> Heather [name] is very anxious about everything & has a cancer  phobia; Heather [name] followed this benign simple cyst for yrs- no change...     Hx Breast cancer> Heather [name] had left mod radical mastectomy 1994 by Heather [name]- no known recurrence & Heather [name] is given reassurance...     Ortho- DJD, Cspine spondy, LBP, CTS> followed by Heather [name] & he injected her right knee 1wk ago; on Vicodin & Tramadol but prefers the latter Heather [name] says...     Anxiety> on Alpraz0.57mTid but "I'm not taking it very often" Heather [name] says & encouraged to use more regularly...  We reviewed prob list, meds, xrays and labs> see below for updates >>   ~  December 13, 2012:  [place] but launches into her usual litany of complaints> feels bad in the mornings, legs sore "they get hot", "I get hot in my back", c/o "hocking up little white balls of phlegm", stools are dark & small, etc... Heather [name] thinks Heather [name] needs massage for her back problems & has seen Heather [name] w/ PT recommended... Heather [name] is rec to increase Mucinex 6037m2Bid w/ Fluids;  Not currently c/o dyspnea;  BP well controlled on meds;  Heather [name] has chronic GI complaints managed by Heather [name] & Heather [name] had no specific GI complaints today;  Heather [name] has Vicodin & Tramadol for pain;  Heather [name] has AlOptician, dispensingor anxiety & rec (again) to take it more regularly...     We reviewed prob list, meds, xrays and labs> see below for updates >>   ~  January 07, 2013:  3-4wk ROV & add-on for multiple somatic complaints> Heather [name] insisted on this add-on visit due to mult somatic complaints & when asked what specifically Heather [name] was complaining about Heather [name] launched into a flight of ideas all over the place> stiff; worse & worse; phlegm w/ white flecks & Heather [name] cannot recalll our mult prev discussions about this w/ rec for Mucinex-2Bid & lots of fluids; legs jerking (rec for tonic water at bedtime); "my back is on fire" esp at night (rec to try Gabapentin 10023m-2 at bedtime); wt loss w/ fair appetite (rec to drink Ensure 2 cans/d betw meals); wants to try massage therapy (I told her ok w/ me)...  ~  March 14, 2013:  83mo10mo & Heather [name] persists w/ mult somatic complaints and seems almost inconsolable regarding her health; recall that Heather [name] has a cancer phobia & Heather [name] has been impossible to reassure in this regard no matter how many tests we run or specialists we send her to... Today Heather [name] is concerned about "losing weight right & left" but our scale says Heather [name] has gained 2# to 147# & her BMI= 28-29;  Heather [name] continues to c/o harking phlegm & congestion ("thick lumpy slimy balls of white meat"), states her urine is very yellow in the evening, and her left arm & right leg are swelling...  Heather [name] is also c/o back pain & Heather [name] has seen Heather [name], Ortho 4 times since I saw her last- XRays w/ extensive multilevel DDD in spine, Bone Scan w/o boney mets, "I'm getting therapy"... We discussed checking Sput C&S (NTF only), and increasing her Mucinex600mg62m2Bid + fluids; her recent UA & cult were neg as well...    BP remains well controlled on her 3meds72mGI is stable on Prilosec & laxatives, followed by DrJEdwards;  Heather [name] has Xanax for anxiety but Heather [name] still won't take it regularly as I havew recommended to her...     We reviewed prob list, meds, xrays and labs> see below for updates >>  NOTE: CT  Chest, Abd, Pelvis done 3/14 for additional screening after call from Ortho PA regarding pt's c/o wt loss etc; reports reviewed=> no acute abn identified...   ~  May 30, 2013:  2-40moROV & DAldorais soon to be 80y/o> stable without new complaints but says "terrible, terrible, terrible" & still mentions the perceived "swelling" in her left side of abd ("it's so big i have to hold it up" & asked to try an abd binder) and again mentions a mild cough productive of "little white balls of mucous"; Heather [name] states "DrCrossley can't find where the phlegm is coming from, c/o "hocking little white balls that makes me sick";  [name]odin on Tramadol...     We reviewed prob list, meds, xrays and labs> see below for updates >> Heather [name] is rec to continue f/u w/ her specialists to get the reassurance Heather [name] needs to know that everything possible is being done to address her concerns... Asked to take the Alpraz0.5657mid regularly (Heather [name] never does)...  ~  July 20, 2013:  6wk ROV & nothing has changed for Heather [name]- still c/o pain, swelling, etc... Heather [name] has had follow ups w/GI & Ortho- notes reviewed; Heather [name] remains very anxious w/ mult somatic complaints and as prev noted has a cancer phobia but is totally impossible to console, reassure, etc... Wonders why Heather [name] is losing so much weight (weight is up 4# to  152# today) & we reviewed no salt, elevation, support hose, & the Lasix...     We reviewed prob list, meds, xrays and labs> see below for updates >>  Heather [name] refuses the Flu vaccine... Heather [name] is asked to ret for Fasting blood work=> Heather [name] never did...  ~  September 06, 2013:  6wk ROSanta Barbaraays "I'm sick, so clogged up, I can feel all my guts" (says Heather [name] can feel her intestines, but has lost her stomach) w/ her usual mult somatic complaints but few physical findings;  We decided to do CXR, Ambulatory O2 check, and non-fasting labs today...     We reviewed prob list, meds, xrays and labs> see below for updates >> Heather [name] wants handicap sticker- OK... CXR 10/14 showed normal heart size w/ tortuous atherosclerotic Ao, clear lungs w/ ?vague opac left mid zone adjacent to hilum (nothing seen in this area on CT 3/14) ?overlying shadows & we will f/u... Ambulatory O2 check> O2 sat= 98% on room air at rest w/ pulse=83, regular; Ambulated 3 laps w/ lowest O2 sat= 96% w/ pulse=108... LABS 10/14:  Chems- wnl;  CBC- wnl;  TSH=2.04;  VitD=71;  Sed=11...   ~  December 07, 2013:  57m30moV & Heather [name] has no new complaints today; Heather [name] voiced concern over prev [place] visit w/ N&V, had IV, ?got infected "they had to operate on my hand", but her CC is the [facility] from [employer] Heather [name] persists w/ GI complaints> gas, N&V, swelling, etc;  Throat symptoms and Heather [name]'s seen DrCrossley;  LBP and Heather [name]'s followed by Heather [name], shots w/o help Heather [name] says;  Cancer phobia as we've noted in the past but Heather [name] is unconsolable...     HBP> on Coreg3.125Bid, Amlod2.5, Lisin40; BP= 100/60 & Heather [name] persists w/ mult somatic complaints...    HxCP> on ASA81; followed by Heather [name]/ Heather [name] for Cards & their prev notes are reviewed=> neg ischemic work up previously...     VI, Edema> Heather [name] went to the ER 11/13 w/ edema; VDopplers were neg for DVT; they reiterated the recs for no salt,  elevation, support hose, & continue Lasix40.     GI- HH, Gastirits, Divertics, chronic  pain & "swelling"> on Omep73m/d plus Miralax & Senakot-S; Heather [name] persists w/ mult GI complaints despite [facility], [facility]...    Renal cyst> Heather [name] is very anxious about everything & has a cancer phobia; Heather [name] followed this benign simple cyst for yrs- no change...     Hx Breast cancer> Heather [name] had left mod radical mastectomy 1994 by Heather [name]- no known recurrence & Heather [name] is given reassurance...     Ortho- DJD, Cspine spondy, LBP, CTS> followed by Heather [name] & he injected her right knee & her back; on Vicodin & Tramadol but prefers the latter Heather [name] says...     Anxiety> on Alpraz0.532mid but "I'm not taking it very often" Heather [name] says & encouraged to use more regularly...  We reviewed prob list, meds, xrays and labs> see below for updates >> Heather [name] wants a ZPak for the drainage in her throat...  ~  Apr 04, 2014:  71m68moV & [place] has established w/ Heather [name] for Primary care and has seen him x3 already... Heather [name] still sees DrCrosslry for ENT and Heather [name] for GI>>    From the Pulmonary standpoint Heather [name] is c/o similar symptoms to prev- cough, harking up thick balls of white phlegm "it's so bad- big lumps, it's so slimy that it just jumps out, it's unbearable"; Heather [name] notes some mild SOB/DOE but not progressive & by all accounts Heather [name] gets along remarkably well for 89 88o; Heather [name] denies f/c/s but notes "I get hot"; Heather [name] denies CP, palpit, dizzy, edema; prev asked to take Mucinex but Heather [name] wouldn't do it because DrCrossley told her not to for some reason but Heather [name] is no better on his regimen...    BASELINE DATA>>   Baseline CXRs showed sl overinflation, clear lungs, normal heart size w/ elongation & tortuous Ao...  Prev CT Chest/ CTA 3/14 showed no PE, no acute findings, +atherosclerotic changes, several scattered tiny nodules 3-71mm53mze & no change from 2009 scan, s/p left mastectomy, DJD Tspine...   PFTs 9/13 were WNL> FVC=2.62 (128%), FEV1=2.20 (158%), %1sec=84; mid-flows=274% of predicted  CXR 04/04/14  shows stable heart size, atherosclerotic & tortuous Ao, mild hyperinflation, clear lungs w/o acute changes, DJD Tspine...  PLAN:  Heather [name] is asked to start MUCINEX 600mg12m tab Qid w/ plenty of water; and DELSYM 2 tsp po Qhs...  I tried to give her maximum reassurance & we will recheck her in 3 months...  ~  July 03, 2014:  38mo R1mo in the interval Heather [name] has seen Heather [name] x3, Heather [name] x1, and [facility]  regarding her abd discomfort, wt loss, LPR/ coking/ coughing...    From the pulmonary standpoint Heather [name] says Heather [name] is breathing OK, then Heather [name] says Heather [name] meant "terrible" and notes the "I hark a whole lot" stating the phlegm is thick & bitter w/ big lumps & wakes her up at 3-4AM; Heather [name] cannot articulate any real SOB/DOE & Heather [name] remains active;  Heather [name] stopped the prev Mucinex and uses Delsym prn- as noted prev PFT in 2013 was wnl;  Heather [name] has mult mult somatic complaints...  We discussed rechecking PFT & Ambulatory O2 sat test today...  PFT > despite mult attempts w/ several test administrators/ staff- Heather [name] was unable to perform the procedure due to inability to cooperate...  Ambulatory O2sat Test on RA>  Baseline O2sat= 99% w/ pulse=86;  After 3 laps her nadir O2sat= 96% w/ max pulse= 114... PLAN>>  we decided to add HUMIBID-DM one cap Bid w/ Fluids (Heather [name] won't take Mucinex due to something that DrCrossley said yrs ago); we reviewed the important ANTI-REFLUX regimen to help her nocturnal symptoms...  ~  September 21, 2014:  98mo ROV & pt added-on today at her request for mult symptoms> but on questioning it is apparent that her symptomatology is chronic w/o acute features; Heather [name] notes cough ("hocking cough"), thick white sputum ("with white balls"), chest congestion; but Heather [name] denies to me any f/c/s, CP, hemoptysis, etc and Heather [name] notes that "my breathing is good";;;  Then Heather [name] launches into a litany of complaints- poor appetite, wt loss ("I'm nothing but a frame"), back pain ("the drawing kind"), and general  flight of ideas;  Meds reviewed & include prev ZPak & Mucinex (?taking 1 Qid);  E$xam is neg w/ clear chest- no wheezing, rales, or rhonchi hear;  Last CXR was 5/15 showing essentially clear lungs, NAD...    Heather [name] saw Cherly Hensen for Cards 9/15> Hx HBP, CHF/ DD, AtypCP, RBBB, neg Myoview 2009; on Coreg3.125Bid, Amlod2.5, Lasix40 as needed for swelling; BP= 112/60 today...     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     Hx mult Ortho complaints and LBP evaluated by Heather [name] w/ Epid shots per DrNewton but Heather [name] claims no improvement; notes Tramadol doesn't help and want Vicodin refilled, Heather [name] wears a back brace...     Very anxoius and has Celexa10 & Alprazolam 0.5mg  on her med list...  PLAN>>  Heather [name] is reassured- her breathing is stable w/o acute lung problems at this time; Heather [name] is asked to continue the Mucinex600mg  Qid w/ fluids, and remain as active as possible; reminded to use her ProtonixBid & follow antireflux regimen to help her LPR;  Heather [name] will f/u w/ her other physicians and see me in 6 months for re-evaluation & f/u CXR...           Problem List:  RESPIRATORY SYMPTOMS >> c/o "hocking phlegm" DYSPNEA (ICD-786.9) - eval 5/11 c/o SOB- can't get a DB, can't get enough air in, etc... occurs at rest & w/ exerc "I'm very active despite my arthritis", talks rapidly in long sentences w/o apparent distress... no cough, phlegm, hemoptysis, etc- but as usual Heather [name] is quite distressed by these symptoms and wants further eval> we discussed re-assessment w/ CXR (clear, NAD);  PFT (totally norm airflow);  Labs (all WNL);  Rx w/ ALPRAZOLAM 0.5mg  Tid & encouraged to take regularly. ~  CXR 4/13 showed norm heart size, clear lungs, mild TSpine spondylosis... ~  Heather [name] continues to complain of drainage, throat symptoms, etc; Rx w/ Mucinex, OTC antihist, Nasonex,  etc;  Seen by ENT, Heather [name] w/ ?xerostomia & rec incr water etc. ~  9/13: Add-on visit for "hocking, it's not a cough, it's a hock"> see above... ~  9/13: CXR shows sl overinflation, clear lungs, normal heart size w/ elongation & tortuous Ao;  PFTs are wnl> FVC=2.62 (128%), FEV1=2.20 (158%), %1sec=84; mid-flows=274% of predicted... We discussed trial rx for her symptoms- Mucinex, Fluids, Hycodan, Dulera100- 1puffbid. ~  10/13: Heather [name] reports [facility] to [facility] w/ throat & mouth symptoms "like glue" therefore stopped this med; advised to continue the others. ~  3/14:  CT Chest (done after call from Ortho PA regarding her symptoms)=> then CTA (done via an ER visit) showed no PE, no acute findings, +atherosclerotic changes, several scattered  tiny nodules 3-30m size & no change from 2009 scan, s/p left mastectomy, DJD Tspine...  ~  4/14: Heather [name] is asked to increase the Mucinex60100mto [facility] plenty of water daily... ~  10/14: CXR showed normal heart size w/ tortuous atherosclerotic Ao, clear lungs w/ ?vague opac left mid zone adjacent to hilum (nothing seen in this area on CT 3/14) ?overlying shadows & we will f/u...  ~  CXR 04/04/14 shows stable heart size, atherosclerotic & tortuous Ao, mild hyperinflation, clear lungs w/o acute changes, DJD Tspine... ~  8/15 & 11/15> Heather [name] remains stable from the pulmonary standpoint on Mucinex600Qid + Fluids; rec to follow better antireflux regimen for her cough, LPR, choking...  CXR 04/04/14    Heather [name] [name] >>   HYPERTENSION (ICD-401.9) - on AMLODIPINE 2.66m2m,  COREG 3.1266m3m,  LASIX 40mg26making 1/2tab/d or just using it prn swelling...  Hx of CHEST PAIN (ICD-786.50) - Heather [name] takes ASA 81mg/58mTylenol Prn... followed by Heather [name] for Cards. ~  Baseline EKG w/ RBBB & LAD, no acute changes... ~  NuclearStressTest done 2007 & 2009 showed normal- without scar or ischemia, EF=67%...   ~  2DEcho 7/08 showed mild AI, mild diastolic  dysfunction, EF=55-CX=44-81%T Abd 1/09 & 9/12 showed incidental coronary calcif, & aorto-iliac atherosclerotic changes as well... ~  8/13: Heather [name] saw Heather [name]> HBP, DD, atypCP w/ neg Myoviews & no changes made... ~  10/13:  Heather [name] uses a back brace/ abd binder to help her CWP...  VENOUS INSUFFICIENCY (ICD-459.81) - Heather [name] has mod VI and follows a low sodium diet, elevates legs when able, and wears support hose. ~  7/12:  C/o incr edema & we reviewed a 2gm sodium diet & increased her Lasix from 20mg/d21m40mg/d.666m  5/13:  Heather [name] has persist complaints but mild chr VI w/ min edema & reminded to elim sodium, elev, support hose, take the Lasix... ~ 11/13: Heather [name] went to the ER w/ edema; VDopplers were neg for DVT; they reiterated the recs for no salt, elevation, support hose, & continue Lasix40.  HIATAL HERNIA (ICD-553.3) & GASTRITIS (ICD-535.50) >>  ~  on OMEPRAZOLE 20mg/d &7mlowed by DrJEdwards for GI having fired Heather [name] & [facility] in past. ~  last EGD was 2/08 w/ 3cm HH & gastitis... ~  CT Abd 1/09 revealed calcif gallstones, & mild ectasia of AA... ~  CT Abd 1/11 showed left colon divertics, tiny hepatic lesions w/o change & right renal cyst stable. ~  CT Abd 9/12 showed divertics (no inflamm), right renal & left hep lobe cysts, atherosclerotic changes, NAD... ~  Seen by DrEdwards 4/13> c/o dysphagia & congestion; he notes mult GI complaints, known esoph dysmotility w/o stricture, hard to pin down her complaints... ~  Heather [name] has followed up w/ Heather [name] regarding her concerns about swelling in her abd... ~  CT Abd Pelvis 3/14 showed Tiny cysts in the lateral segment left hepatic lobe, 1.4 cm right upper pole renal cyst, atherosclerotic calcifications of the abdominal aorta and branch vessels, DJD spine... ~  2015: now on Protonix40Bid but not taking regularly & advised BID dosing 7 antireflux regimen for her LPR...   DIVERTICULOSIS, COLON (ICD-562.10) - followed regularly by Heather [name]. ~  colonoscopy 7/03 by Heather [name]  showing divertics only... we discussed Miralax + Senakot-S for constipation. ~  colonoscopy 2/11 showed divertics, 3mm polyp74mems...  ? of ABDOMINAL PAIN & SYMPTOMATOLOGY - Heather [name] had a very thorough work up in 2009> Heather [name]  has a cancer phobia and is very difficult to reassure her that everything appropriate has been done & no cancer found... Mult additional GI evals since then from [facility], [facility] (DrEdwards), [facility] (DrThorne)... ~  eval by GI- Heather [name] 12/08 but pt wasn't satisfied w/ his examination... ~  full labs 1/09 were WNL, sed=20... ~  CT Abd/ Pelvis 1/09= no acute abn in abd (calcif gallstones & mild ectasia of AA), calcif fibroid in the pelvis, & ?regarding the left ovary. ~  referral to DrFontaine, GYN> his notes are reviewed... ~  follow up w/ Heather [name], Urology> his notes are reviewed... ~  eval Heather [name] for Orthopedics- rec shots in back by [facility] w/ some improvement. ~  repeat GI eval 1/11 by Heather [name] w/ CT & colonoscpy as above- no acute problems. ~  5/11 & 7/11 >> persist complaints w/ neg recheck by Heather [name] & Heather [name]... ~  12/11:  routine GYN surveillance DrFontaine found anteverted uterus w/ fibroids, atrophic ovaries bilat, no mass. ~  Repeat GI eval 4/12 by Heather [name]> he tried to reassure the pt, felt some symptoms might be from poor abd wall muscle tone, rec continue Omep, laxatives... ~  6/12:  Heather [name] sought another GI opinion DrEdwards> he did not feel Heather [name] needed any additional testing... ~  Heather [name] tells me Heather [name] has yet another appt to see a gastroenterologist at [facility]==> seen by DrThorne 8/12 & note reviewed... ~  EGD by DrThorne 9/12 w/ mult whitish nodular plaques in cardia & fundus> Bx results pending & Heather [name] is very concerned (remember her hx cancer phobia). ~  Heather [name] continues to f/u w/ [facility] & is happy w/ her eval... ~  Now back w/ Heather [name] at [facility]...  RENAL CYST (ICD-593.2) - Heather [name] has a 1 cm right renal cyst seen on CT in 2007 and followed by  Heather [name]... f/u CT Abd 1/09 - no change in the cyst... f/u renal ultrasound 1/10 w/ small bilat cysts... f/u CT Abd 1/11- no change in cyst. ~  Heather [name] saw Heather [name] 7/11 for LBP, microhematuria, urge incontinence- stable, no additional Rx. ~  Heather [name] saw [facility] 1/12 for urge incontinence, freq, nocturia> rec Desmopressin 0.19m- 1/2 tab Qhs.  CARCINOMA, BREAST, LEFT (ICD-174.9) - Surgery 9/94 by Heather [name] w/ Left modified radical mastectomy... +estrogen receptors and Rx w/ Tamoxifen... f/u mammograms all neg... breast exam on right has been neg- no nodules palpated... Heather [name] says that DrBertrand insisted that her PCP check her right breast at each & every office visit to be sure Heather [name] doesn't develop any nodules...  DEGENERATIVE JOINT DISEASE (ICD-715.90) - on OTC meds + VICODIN Prn... 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 Heather [name] has a knee brace and a cane for ambulation... ~  8/13:  Heather [name] wants a better pain pill & we discussed change to NDudleyprn... ~  12/13: Heather [name] tells me that Heather [name] injected her knee last wk- improved...  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 Rx w/ rest, heat, Percocet, and f/u w/ Ortho...  Hx of LOW BACK PAIN, CHRONIC (ICD-724.2) - eval Heather [name] w/ rec for shots by DrNewton & Heather [name]'s feeling sl better & wears back brace... ~  5/10: mult somatic complaints- primarily c/o left side/ postero-lat rib pain...  Exam w/ tender left lat/ post ribs... Bone Scan= neg... ~  MRI Lumbar spine 7/10 by Heather [name]... ~  Epidural steroid shot by  DrNewton 8/10 & 1/11 ~  Repeat LBP eval by Heather [name] w/ another MRI lumbar spine showing degenerative changes w/ mild progression since 7/10 MRI, mild-mod sp stenosis & lat recess stenosis at several levels ~  Heather [name] referred her to [facility] to consider more shots but Heather [name] is not so inclined... ~  6/13:  Heather [name] saw DrTooke w/ lumbar spondylosis; he felt that he had nothing to offer her & rec incr Tramadol to Qid... ~  2014:  Heather [name] has had numerous visits w/ Heather [name]/ Ernestina Patches for injections, XRays, scans etc...  CARPAL TUNNEL SYNDROME, BILATERAL (ICD-354.0) - Heather [name] had prev right carpal tunnel release by Heather [name] and was c/o increasing discomfort in her left wrist despite wrist splint Qhs- had left carpal tunnel release 1/10 by Heather [name]...  ANXIETY (ICD-300.00) - on ALPRAZOLAM 0.$RemoveBeforeD'5mg'[id number]$  Tid & encouraged to take it regularly... 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 ~  7/13: Heather [name] went to the ER c/o swelling of the tongue but eval by EDP indicated that her tongue was normal & not swollen, Heather [name] was not on an ACE, given Pred Rx anyway. ~  Heather [name] has been asked to incr the ALprazolam to regular dosing but Heather [name] is reluctant...   Past Surgical History  Procedure Laterality Date  . Left mastectomy    . Carpal tunnel release      Outpatient Encounter Prescriptions as of 09/21/2014  Medication Sig  . ALPRAZolam (XANAX) 0.5 MG tablet Take 1/2 to 1 tablet by mouth three times daily as needed  . amLODipine (NORVASC) 2.5 MG tablet Take 1 tablet (2.5 mg total) by mouth daily.  Marland Kitchen aspirin EC 81 MG tablet Take 81 mg by mouth daily.  . carvedilol (COREG) 3.125 MG tablet Take 1 tablet (3.125 mg total) by mouth 2 (two) times daily.  . Cholecalciferol (VITAMIN D3) 1000 UNITS CAPS Take 1 capsule by mouth daily. For your bones  . citalopram (CELEXA) 10 MG tablet Take 1 tablet (10 mg total) by mouth daily.  . furosemide (LASIX) 40 MG tablet Take 1 tablet (40 mg total) by mouth daily. For swelling (Patient taking differently: Take 40 mg by mouth as needed. For swelling)  . guaiFENesin (MUCINEX) 600 MG 12 hr tablet Take 1-2 tablet by mouth two times daily  . Multiple Vitamins-Minerals (MULTIVITAMINS THER. W/MINERALS) TABS Take 1 tablet by mouth daily.  .  pantoprazole (PROTONIX) 40 MG tablet Take 1 tablet (40 mg total) by mouth 2 (two) times daily.  . polyethylene glycol powder (GLYCOLAX) powder Take 17 g by mouth 2 (two) times daily.  . sennosides-docusate sodium (SENOKOT-S) 8.6-50 MG tablet Take 2 tablets by mouth daily as needed. For constipation  . traMADol (ULTRAM) 50 MG tablet Take 50 mg by mouth every 6 (six) hours as needed.  Marland Kitchen azithromycin (ZITHROMAX) 250 MG tablet Take 1 tablet (250 mg total) by mouth as directed.  Marland Kitchen HYDROcodone-acetaminophen (NORCO) 5-325 MG per tablet Take 1 tablet by mouth every 6 (six) hours as needed for severe pain.    Allergies  Allergen Reactions  . Fentanyl     REACTION: nausea    Current Medications, Allergies, Past Medical History, Past Surgical History, Family History, and Social History were reviewed in [employer] record.    Review of Systems      See HPI - all other systems neg except as noted... Heather [name] has mult somatic complaints and anxiety. The patient complains of decreased hearing, chest discomfort,  dyspnea on exertion, abdominal pain, incontinence, muscle weakness, and difficulty walking.  The patient denies anorexia, fever, vision loss, hoarseness, syncope, peripheral edema, prolonged cough, headaches, hemoptysis, melena, hematochezia, severe indigestion/heartburn, hematuria, transient blindness, depression, abnormal bleeding, enlarged lymph nodes, and angioedema.     Objective:   Physical Exam   WD, WN, 81 y/o Heather [name] in NAD... Heather [name] is very anxious... GENERAL:  Alert & oriented; pleasant & cooperative... HEENT:  [place]/AT, EOM-wnl, PERRLA, EACs-clear, TMs-wnl, NOSE-clear, THROAT-clear & wnl, no lesions seen. NECK:  Neck ROM decreased, no JVD; normal carotid impulses w/o bruits; no thyromegaly or nodules palpated; no lymphadenopathy. CHEST:  Clear to P & A; without wheezes/ rales/ or rhonchi heard... BREAST:  s/p left mastectomy... right breast normal- without nodules  palpated... HEART:  Regular Rhythm; without murmurs/ rubs/ or gallops detected... ABDOMEN:  Soft & nontender; normal bowel sounds; no organomegaly or masses palpated... EXT: without deformities, mod arthritic changes; no varicose veins/ +venous insuffic/ tr edema. NEURO:  CN's intact; no focal neuro deficits... DERM:  Small sl excoriated area of dermatitis left shin- no drainage,no rash, etc...  RADIOLOGY DATA:  Reviewed in the EPIC EMR & discussed w/ the patient...  LABORATORY DATA:  Reviewed in the EPIC EMR & discussed w/ the patient...   Assessment & Plan:    RESPIRATORY SYMPTOMS w/ Dyspnea & throat clearing, prob LPR>  Heather [name] had a neg pulm eval 2011 and numerous times thereafter; Heather [name] insisted on additional eval 9/13 for her chr throat clearing symptom; CXR w/o acute changes & PFTs normal for her 80 y/o lungs; Heather [name] is quite insistent on med rx for her problem but has not had relief from anything; REC to take [id number], Fluids, DELSYM prn; pt very anxious & Alprazolam seems to help when Heather [name] takes it... 11/15> Heather [name] has been taking the OTC MUCINEX 655m Qid w/ fluids and seems sl improved; rec strict antireflux regimen w/ Protonix before dinner, NPO after dinner, elev HOB 6" blocks...   Hx CP> evaluated for Cards by DrBensimon & he has not been in favor of invasive testing; stable on conservative med rx...  GI> HH, Gastritis, Divertics, vague abd pain>  I tried once again to reassure her about the thorough GI evaluations that her has undergone; Heather [name] is happy w/ DrThorne's eval & will f/u w/ her & in the meanwhile continue rx w/ Prilosec40, Miralax, Metamucil, Senakot, Mylicon, etc... Heather [name] has re-established w/ [facility]- their notes reviewed...  Hx left Breast Cancer>  No known recurrence...  Anxiety>  Asked once again to increase the Alprazolam to 1/2 - 1 tab TID regularly...   Patient's Medications  New Prescriptions   No medications on file  Previous Medications   ALPRAZOLAM (XANAX)  0.5 MG TABLET    Take 1/2 to 1 tablet by mouth three times daily as needed   AMLODIPINE (NORVASC) 2.5 MG TABLET    Take 1 tablet (2.5 mg total) by mouth daily.   ASPIRIN EC 81 MG TABLET    Take 81 mg by mouth daily.   AZITHROMYCIN (ZITHROMAX) 250 MG TABLET    Take 1 tablet (250 mg total) by mouth as directed.   CARVEDILOL (COREG) 3.125 MG TABLET    Take 1 tablet (3.125 mg total) by mouth 2 (two) times daily.   CHOLECALCIFEROL (VITAMIN D3) 1000 UNITS CAPS    Take 1 capsule by mouth daily. For your bones   CITALOPRAM (CELEXA) 10 MG TABLET    Take 1 tablet (10 mg total) by mouth daily.  FUROSEMIDE (LASIX) 40 MG TABLET    Take 1 tablet (40 mg total) by mouth daily. For swelling   GUAIFENESIN (MUCINEX) 600 MG 12 HR TABLET    Take 1-2 tablet by mouth two times daily   HYDROCODONE-ACETAMINOPHEN (NORCO) 5-325 MG PER TABLET    Take 1 tablet by mouth every 6 (six) hours as needed for severe pain.   MULTIPLE VITAMINS-MINERALS (MULTIVITAMINS THER. W/MINERALS) TABS    Take 1 tablet by mouth daily.   PANTOPRAZOLE (PROTONIX) 40 MG TABLET    Take 1 tablet (40 mg total) by mouth 2 (two) times daily.   POLYETHYLENE GLYCOL POWDER (GLYCOLAX) POWDER    Take 17 g by mouth 2 (two) times daily.   SENNOSIDES-DOCUSATE SODIUM (SENOKOT-S) 8.6-50 MG TABLET    Take 2 tablets by mouth daily as needed. For constipation   TRAMADOL (ULTRAM) 50 MG TABLET    Take 50 mg by mouth every 6 (six) hours as needed.  Modified Medications   No medications on file  Discontinued Medications   No medications on file

## 2014-09-21 NOTE — Telephone Encounter (Signed)
Called the patient left a detailed message hardcopy is ready for pickup at the front desk. 

## 2014-09-21 NOTE — Telephone Encounter (Signed)
Pt states she called pharmacy and was told to call PCP, she needs Protonix and Hydrocodone Acetaminophen for her back pain.

## 2014-09-26 ENCOUNTER — Telehealth: Payer: Self-pay | Admitting: Internal Medicine

## 2014-09-26 NOTE — Telephone Encounter (Signed)
The patient stated the hydrocodone is not helping.  Would it be ok to take OTC arthritis strength tylenol and if ok how much/often should she take?

## 2014-09-26 NOTE — Telephone Encounter (Signed)
Patient has questions about pain medication. Please advise

## 2014-09-27 NOTE — Telephone Encounter (Signed)
Pt calling back to check to see about the OTC med , see prev message. Pls let pt know.

## 2014-09-27 NOTE — Telephone Encounter (Signed)
Sorry, this note was accidentally deleted by myself yesterday.  OK to take tylenol arthritis, one pill every 8 hrs as needed

## 2014-09-27 NOTE — Telephone Encounter (Signed)
Called left message to call back 

## 2014-09-28 NOTE — Telephone Encounter (Signed)
Patient informed of MD instructions. 

## 2014-10-02 ENCOUNTER — Other Ambulatory Visit: Payer: Self-pay | Admitting: Internal Medicine

## 2014-10-03 NOTE — Telephone Encounter (Signed)
Faxed hardcopy for Alprazolam to [facility] [place] 

## 2014-10-03 NOTE — Telephone Encounter (Signed)
Done hardcopy to robin  

## 2014-10-18 ENCOUNTER — Telehealth: Payer: Self-pay | Admitting: Pulmonary Disease

## 2014-10-18 NOTE — Telephone Encounter (Signed)
Spoke with pt, she c/o mucus collecting in throat, pt choking on it especially when drinking or eating, states she "feels like she needs to belch but cant", prod cough with white thick mucus X3-4 days, dark stool, constipation.  Is requesting recs from SN.  Please advise.  Thank you.

## 2014-10-18 NOTE — Telephone Encounter (Signed)
Per SN---  mucinex otc 600 mg  1 po QID With fluids--water, tea, cranberry juice otc delsym 2 tsp bid

## 2014-10-18 NOTE — Telephone Encounter (Signed)
Pt states she is taking mucinex already tid and taking delsym qhs, wants something more.  Please advise.  Thanks!

## 2014-10-18 NOTE — Telephone Encounter (Signed)
Called and spoke with pt and she is aware that she will need to increase the mucinex to qid  And take the delsym 2 tsp bid.  Pt will increase the mucinex.  She will call GI about her stomach.

## 2014-10-20 ENCOUNTER — Ambulatory Visit (INDEPENDENT_AMBULATORY_CARE_PROVIDER_SITE_OTHER): Payer: Medicare Other | Admitting: Internal Medicine

## 2014-10-20 ENCOUNTER — Encounter: Payer: Self-pay | Admitting: Internal Medicine

## 2014-10-20 VITALS — BP 140/72 | HR 73 | Temp 97.9°F | Ht 62.0 in | Wt 132.0 lb

## 2014-10-20 DIAGNOSIS — I10 Essential (primary) hypertension: Secondary | ICD-10-CM

## 2014-10-20 DIAGNOSIS — R059 Cough, unspecified: Secondary | ICD-10-CM

## 2014-10-20 DIAGNOSIS — R05 Cough: Secondary | ICD-10-CM

## 2014-10-20 DIAGNOSIS — F411 Generalized anxiety disorder: Secondary | ICD-10-CM

## 2014-10-20 DIAGNOSIS — J3089 Other allergic rhinitis: Secondary | ICD-10-CM

## 2014-10-20 MED ORDER — METHYLPREDNISOLONE ACETATE 80 MG/ML IJ SUSP
80.0000 mg | Freq: Once | INTRAMUSCULAR | Status: AC
  Administered 2014-10-20: 80 mg via INTRAMUSCULAR

## 2014-10-20 MED ORDER — FLUTICASONE PROPIONATE 50 MCG/ACT NA SUSP: 2.0000 | Freq: Every day | NASAL | Status: DC

## 2014-10-20 MED ORDER — LORATADINE 10 MG PO TABS: 10.0000 mg | ORAL_TABLET | Freq: Every day | ORAL | Status: DC

## 2014-10-20 NOTE — Progress Notes (Signed)
Pre visit review using our clinic review tool, if applicable. No additional management support is needed unless otherwise documented below in the visit note. 

## 2014-10-20 NOTE — Progress Notes (Signed)
Subjective:    Patient ID: Heather [name], female    DOB: Jan 17, 1918, 80 y.o.   MRN: [id number]  HPI  Here to f/u - Does have several wks ongoing nasal allergy symptoms with clearish congestion, itch and sneezing, without fever, pain, ST, swelling or wheezin, but has worsening acute on chronic cough for 1 yr nonproductive. Pt denies chest pain, increased sob or doe, wheezing, orthopnea, PND, increased LE swelling, palpitations, dizziness or syncope.  Pt denies new neurological symptoms such as new headache, or facial or extremity weakness or numbness   Pt denies polydipsia, polyuria, Denies worsening depressive symptoms, suicidal ideation, or panic; has ongoing anxiety,  Past Medical History  Diagnosis Date  . RBBB (right bundle branch block with left anterior fascicular block)   . Hypertension   . Hiatal hernia   . Gastritis, chronic   . Breast cancer   . Degenerative joint disease   . Anxiety state, unspecified   . Low back pain   . Carpal tunnel syndrome   . Renal cyst   . Diverticula of colon   . Cervical spondylosis   . Diastolic dysfunction   . Venous insufficiency   . Constipation   . Colon polyp 12/19/2009    Transverse-polypoid colorectal mucosa  . Allergic rhinitis, cause unspecified 02/21/2014   Past Surgical History  Procedure Laterality Date  . Left mastectomy    . Carpal tunnel release      reports that she has never smoked. She has never used smokeless tobacco. She reports that she does not drink alcohol or use illicit drugs. family history is negative for Colon cancer. Allergies  Allergen Reactions  . Fentanyl     REACTION: nausea   Current Outpatient Prescriptions on File Prior to Visit  Medication Sig Dispense Refill  . ALPRAZolam (XANAX) 0.5 MG tablet TAKE 1/2 TO 1 TABLET THREE TIMES DAILY AS NEEDED 90 tablet 5  . amLODipine (NORVASC) 2.5 MG tablet Take 1 tablet (2.5 mg total) by mouth daily. 90 tablet 3  . aspirin EC 81 MG tablet Take 81 mg by mouth  daily.    Marland Kitchen azithromycin (ZITHROMAX) 250 MG tablet Take 1 tablet (250 mg total) by mouth as directed. 6 tablet 0  . carvedilol (COREG) 3.125 MG tablet Take 1 tablet (3.125 mg total) by mouth 2 (two) times daily. 180 tablet 3  . Cholecalciferol (VITAMIN D3) 1000 UNITS CAPS Take 1 capsule by mouth daily. For your bones    . citalopram (CELEXA) 10 MG tablet Take 1 tablet (10 mg total) by mouth daily. 90 tablet 3  . furosemide (LASIX) 40 MG tablet Take 1 tablet (40 mg total) by mouth daily. For swelling (Patient taking differently: Take 40 mg by mouth as needed. For swelling) 90 tablet 3  . guaiFENesin (MUCINEX) 600 MG 12 hr tablet Take 1-2 tablet by mouth two times daily    . HYDROcodone-acetaminophen (NORCO) 5-325 MG per tablet Take 1 tablet by mouth every 6 (six) hours as needed for severe pain. 20 tablet 0  . Multiple Vitamins-Minerals (MULTIVITAMINS THER. W/MINERALS) TABS Take 1 tablet by mouth daily.    . pantoprazole (PROTONIX) 40 MG tablet Take 1 tablet (40 mg total) by mouth 2 (two) times daily. 60 tablet 11  . polyethylene glycol powder (GLYCOLAX) powder Take 17 g by mouth 2 (two) times daily. 527 g 1  . sennosides-docusate sodium (SENOKOT-S) 8.6-50 MG tablet Take 2 tablets by mouth daily as needed. For constipation    . traMADol (  ULTRAM) 50 MG tablet Take 50 mg by mouth every 6 (six) hours as needed.     No current facility-administered medications on file prior to visit.   Review of Systems  Constitutional: Negative for unusual diaphoresis or other sweats  HENT: Negative for ringing in ear Eyes: Negative for double vision or worsening visual disturbance.  Respiratory: Negative for choking and stridor.   Gastrointestinal: Negative for vomiting or other signifcant bowel change Genitourinary: Negative for hematuria or decreased urine volume.  Musculoskeletal: Negative for other MSK pain or swelling Skin: Negative for color change and worsening wound.  Neurological: Negative for tremors  and numbness other than noted  Psychiatric/Behavioral: Negative for decreased concentration or agitation other than above       Objective:   Physical Exam BP 140/72 mmHg  Pulse 73  Temp(Src) 97.9 F (36.6 C) (Oral)  Ht 5\' 2"  (1.575 m)  Wt 132 lb (59.875 kg)  BMI 24.14 kg/m2  SpO2 98% VS noted, not ill appearing Constitutional: Pt appears well-developed, well-nourished.  HENT: Head: NCAT.  Right Ear: External ear normal.  Left Ear: External ear normal.  Eyes: . Pupils are equal, round, and reactive to light. Conjunctivae and EOM are normal Neck: Normal range of motion. Neck supple.  Cardiovascular: Normal rate and regular rhythm.   Pulmonary/Chest: Effort normal and breath sounds normal.  Bilat tm's with mild erythema.  Max sinus areas non tender.  Pharynx with mild erythema, no exudate Neurological: Pt is alert. Not confused , motor grossly intact Skin: Skin is warm. No rash Psychiatric: Pt behavior is normal. No agitation. 1-2+ nervous     Assessment & Plan:

## 2014-10-20 NOTE — Patient Instructions (Addendum)
You had the steroid shot today  Please take all new medication as prescribed - the claritin 10 mg per day, and the Steroid nasal spray  Please continue all other medications as before, and refills have been done if requested.  Please have the pharmacy call with any other refills you may need.  Please keep your appointments with your specialists as you may have planned

## 2014-10-21 DIAGNOSIS — R059 Cough, unspecified: Secondary | ICD-10-CM | POA: Insufficient documentation

## 2014-10-21 DIAGNOSIS — R05 Cough: Secondary | ICD-10-CM | POA: Insufficient documentation

## 2014-10-21 HISTORY — DX: Cough, unspecified: R05.9

## 2014-10-21 NOTE — Assessment & Plan Note (Addendum)
Mild to mod, for antihist/flonase asd,  to f/u any worsening symptoms or concerns

## 2014-10-21 NOTE — Assessment & Plan Note (Signed)
stable overall by history and exam, recent data reviewed with pt, and pt to continue medical treatment as before,  to f/u any worsening symptoms or concerns BP Readings from Last 3 Encounters:  10/20/14 140/72  09/21/14 112/60  08/10/14 138/66

## 2014-10-21 NOTE — Assessment & Plan Note (Signed)
Suspect more recent cough related to post nasal gtt, o/w declines cxr eval for now

## 2014-10-21 NOTE — Assessment & Plan Note (Signed)
Persistent hard to control, o/w stable, cont same tx, denies worsening depression or SI

## 2014-10-23 ENCOUNTER — Telehealth: Payer: Self-pay | Admitting: Internal Medicine

## 2014-10-23 NOTE — Telephone Encounter (Signed)
emmi mailed  °

## 2014-10-25 ENCOUNTER — Telehealth: Payer: Self-pay | Admitting: Internal Medicine

## 2014-10-26 ENCOUNTER — Ambulatory Visit: Payer: Medicare Other | Admitting: Internal Medicine

## 2014-10-27 ENCOUNTER — Ambulatory Visit (INDEPENDENT_AMBULATORY_CARE_PROVIDER_SITE_OTHER): Payer: Medicare Other | Admitting: Internal Medicine

## 2014-10-27 ENCOUNTER — Encounter: Payer: Self-pay | Admitting: Internal Medicine

## 2014-10-27 VITALS — BP 120/62 | HR 107 | Temp 98.2°F | Ht 62.0 in | Wt 136.0 lb

## 2014-10-27 DIAGNOSIS — K224 Dyskinesia of esophagus: Secondary | ICD-10-CM

## 2014-10-27 DIAGNOSIS — R634 Abnormal weight loss: Secondary | ICD-10-CM

## 2014-10-27 DIAGNOSIS — I10 Essential (primary) hypertension: Secondary | ICD-10-CM

## 2014-10-27 NOTE — Progress Notes (Signed)
Subjective:    Patient ID: Heather [name], female    DOB: June 18, 1918, 80 y.o.   MRN: [id number]  HPI  Here to f/u, states has ongoing difficulty swallowing, asks for swallowing evaluation, due to choking at times with solids and liquids. 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  GI has indicated they have nothing further to offer her for this issue.  She is concerned about wt loss, but none recent.  Wt Readings from Last 3 Encounters:  10/27/14 136 lb (61.689 kg)  10/20/14 132 lb (59.875 kg)  09/21/14 135 lb (61.236 kg)  no recent recurrent asp pneumonias. Past Medical History  Diagnosis Date  . RBBB (right bundle branch block with left anterior fascicular block)   . Hypertension   . Hiatal hernia   . Gastritis, chronic   . Breast cancer   . Degenerative joint disease   . Anxiety state, unspecified   . Low back pain   . Carpal tunnel syndrome   . Renal cyst   . Diverticula of colon   . Cervical spondylosis   . Diastolic dysfunction   . Venous insufficiency   . Constipation   . Colon polyp 12/19/2009    Transverse-polypoid colorectal mucosa  . Allergic rhinitis, cause unspecified 02/21/2014   Past Surgical History  Procedure Laterality Date  . Left mastectomy    . Carpal tunnel release      reports that she has never smoked. She has never used smokeless tobacco. She reports that she does not drink alcohol or use illicit drugs. family history is negative for Colon cancer. Allergies  Allergen Reactions  . Fentanyl     REACTION: nausea   Current Outpatient Prescriptions on File Prior to Visit  Medication Sig Dispense Refill  . ALPRAZolam (XANAX) 0.5 MG tablet TAKE 1/2 TO 1 TABLET THREE TIMES DAILY AS NEEDED 90 tablet 5  . amLODipine (NORVASC) 2.5 MG tablet Take 1 tablet (2.5 mg total) by mouth daily. 90 tablet 3  . aspirin EC 81 MG tablet Take 81 mg by mouth daily.    Marland Kitchen  azithromycin (ZITHROMAX) 250 MG tablet Take 1 tablet (250 mg total) by mouth as directed. 6 tablet 0  . carvedilol (COREG) 3.125 MG tablet Take 1 tablet (3.125 mg total) by mouth 2 (two) times daily. 180 tablet 3  . Cholecalciferol (VITAMIN D3) 1000 UNITS CAPS Take 1 capsule by mouth daily. For your bones    . citalopram (CELEXA) 10 MG tablet Take 1 tablet (10 mg total) by mouth daily. 90 tablet 3  . fluticasone (FLONASE) 50 MCG/ACT nasal spray Place 2 sprays into both nostrils daily. 16 g 2  . furosemide (LASIX) 40 MG tablet Take 1 tablet (40 mg total) by mouth daily. For swelling (Patient taking differently: Take 40 mg by mouth as needed. For swelling) 90 tablet 3  . guaiFENesin (MUCINEX) 600 MG 12 hr tablet Take 1-2 tablet by mouth two times daily    . HYDROcodone-acetaminophen (NORCO) 5-325 MG per tablet Take 1 tablet by mouth every 6 (six) hours as needed for severe pain. 20 tablet 0  . loratadine (CLARITIN) 10 MG tablet Take 1 tablet (10 mg total) by mouth daily. 90 tablet 3  . Multiple Vitamins-Minerals (MULTIVITAMINS THER. W/MINERALS) TABS Take 1 tablet by mouth daily.    . pantoprazole (PROTONIX) 40 MG tablet Take 1 tablet (40 mg total) by mouth 2 (two) times daily. 60 tablet  11  . polyethylene glycol powder (GLYCOLAX) powder Take 17 g by mouth 2 (two) times daily. 527 g 1  . sennosides-docusate sodium (SENOKOT-S) 8.6-50 MG tablet Take 2 tablets by mouth daily as needed. For constipation    . traMADol (ULTRAM) 50 MG tablet Take 50 mg by mouth every 6 (six) hours as needed.     No current facility-administered medications on file prior to visit.   Review of Systems  Constitutional: Negative for unusual diaphoresis or other sweats  HENT: Negative for ringing in ear Eyes: Negative for double vision or worsening visual disturbance.  Respiratory: Negative for choking and stridor.   Gastrointestinal: Negative for vomiting or other signifcant bowel change Genitourinary: Negative for  hematuria or decreased urine volume.  Musculoskeletal: Negative for other MSK pain or swelling Skin: Negative for color change and worsening wound.  Neurological: Negative for tremors and numbness other than noted  Psychiatric/Behavioral: Negative for decreased concentration or agitation other than above       Objective:   Physical Exam BP 120/62 mmHg  Pulse 107  Temp(Src) 98.2 F (36.8 C) (Oral)  Ht 5\' 2"  (1.575 m)  Wt 136 lb (61.689 kg)  BMI 24.87 kg/m2  SpO2 96% VS noted,  Constitutional: Pt appears well-developed, well-nourished.  HENT: Head: NCAT.  Right Ear: External ear normal.  Left Ear: External ear normal.  Eyes: . Pupils are equal, round, and reactive to light. Conjunctivae and EOM are normal Neck: Normal range of motion. Neck supple.  Cardiovascular: Normal rate and regular rhythm.   Pulmonary/Chest: Effort normal and breath sounds normal.  Abd:  Soft, NT, ND, + BS Neurological: Pt is alert. Not confused , motor grossly intact Skin: Skin is warm. No rash Psychiatric: Pt behavior is normal. No agitation.     Assessment & Plan:

## 2014-10-27 NOTE — Progress Notes (Signed)
Pre visit review using our clinic review tool, if applicable. No additional management support is needed unless otherwise documented below in the visit note. 

## 2014-10-29 NOTE — Assessment & Plan Note (Signed)
None recent, d/w pt recent wts, overall stable, tried to reassure, cont to encourage po

## 2014-10-29 NOTE — Assessment & Plan Note (Signed)
Chronic persistent, d/w pt her predicament unfortuantely has no practical tx at this time, to cont diet as tolerated, noted no recent aspiration/pna

## 2014-10-29 NOTE — Assessment & Plan Note (Signed)
stable overall by history and exam, recent data reviewed with pt, and pt to continue medical treatment as before,  to f/u any worsening symptoms or concerns BP Readings from Last 3 Encounters:  10/27/14 120/62  10/20/14 140/72  09/21/14 112/60

## 2014-10-29 NOTE — Patient Instructions (Signed)
Please continue all other medications as before, and refills have been done if requested.  Please have the pharmacy call with any other refills you may need.  Please continue your efforts at being more active, low cholesterol diet, and weight control.  Please keep your appointments with your specialists as you may have planned     

## 2014-10-30 ENCOUNTER — Telehealth: Payer: Self-pay | Admitting: Pulmonary Disease

## 2014-10-30 ENCOUNTER — Telehealth: Payer: Self-pay | Admitting: Internal Medicine

## 2014-10-30 NOTE — Telephone Encounter (Signed)
Message not needed. °

## 2014-10-30 NOTE — Telephone Encounter (Signed)
Pt called in said that she is still not better with the swallowing issue. She is wanting to be referred to someone. She said that she cant take it anymore, it keeps getting worse and worse.

## 2014-10-30 NOTE — Telephone Encounter (Signed)
Pt called in said that she is still not better with the swallowing issue.  She is wanting to be referred to someone.  She said that she cant take it anymore, it keeps getting worse and worse.

## 2014-10-30 NOTE — Telephone Encounter (Signed)
Error. Will put this on [place].

## 2014-10-30 NOTE — Telephone Encounter (Signed)
Please remind pt that this same issue has been addressed in the past year, and GI states they have nothing else to offer, and there is no specific tx for her eosphageal problem with swallowing

## 2014-10-31 NOTE — Telephone Encounter (Signed)
Patient informed of PCP instructions. 

## 2014-11-12 ENCOUNTER — Emergency Department (HOSPITAL_COMMUNITY)
Admission: EM | Admit: 2014-11-12 | Discharge: 2014-11-12 | Disposition: A | Discharge status: Home / Self Care | Payer: Medicare Other | Source: Home / Self Care | Attending: Family Medicine | Admitting: Family Medicine

## 2014-11-12 ENCOUNTER — Encounter (HOSPITAL_COMMUNITY): Payer: Self-pay | Admitting: *Deleted

## 2014-11-12 DIAGNOSIS — J329 Chronic sinusitis, unspecified: Secondary | ICD-10-CM

## 2014-11-12 DIAGNOSIS — R0982 Postnasal drip: Secondary | ICD-10-CM

## 2014-11-12 MED ORDER — IPRATROPIUM BROMIDE 0.06 % NA SOLN: 2.0000 | Freq: Four times a day (QID) | NASAL | Status: DC

## 2014-11-12 NOTE — ED Provider Notes (Signed)
CSN: [id number]     Arrival date & time 11/12/14  1254 History   First MD Initiated Contact with Patient 11/12/14 1332     Chief Complaint  Patient presents with  . Cough   (Consider location/radiation/quality/duration/timing/severity/associated sxs/prior Treatment) HPI Comments: Patient presents with a 3 month history of post nasal drainage that precipitates a cough. States that sometimes she has a difficult time coughing up or expelling this mucous. Has been placed on mucinex and claritin by her PCP and states that these medications have brought her little relief.   The history is provided by the patient.    Past Medical History  Diagnosis Date  . RBBB (right bundle branch block with left anterior fascicular block)   . Hypertension   . Hiatal hernia   . Gastritis, chronic   . Breast cancer   . Degenerative joint disease   . Anxiety state, unspecified   . Low back pain   . Carpal tunnel syndrome   . Renal cyst   . Diverticula of colon   . Cervical spondylosis   . Diastolic dysfunction   . Venous insufficiency   . Constipation   . Colon polyp 12/19/2009    Transverse-polypoid colorectal mucosa  . Allergic rhinitis, cause unspecified 02/21/2014   Past Surgical History  Procedure Laterality Date  . Left mastectomy    . Carpal tunnel release     Family History  Problem Relation Age of Onset  . Colon cancer Neg Hx    History  Substance Use Topics  . Smoking status: Never Smoker   . Smokeless tobacco: Never Used  . Alcohol Use: No   OB History    No data available     Review of Systems  All other systems reviewed and are negative.   Allergies  Fentanyl  Home Medications   Prior to Admission medications   Medication Sig Start Date End Date Taking? Authorizing Provider  ALPRAZolam Duanne Moron) 0.5 MG tablet TAKE 1/2 TO 1 TABLET THREE TIMES DAILY AS NEEDED 10/03/14  Yes Biagio Borg, MD  amLODipine (NORVASC) 2.5 MG tablet Take 1 tablet (2.5 mg total) by mouth daily.  02/02/14  Yes Biagio Borg, MD  aspirin EC 81 MG tablet Take 81 mg by mouth daily.   Yes Historical Provider, MD  carvedilol (COREG) 3.125 MG tablet Take 1 tablet (3.125 mg total) by mouth 2 (two) times daily. 02/02/14  Yes Biagio Borg, MD  Cholecalciferol (VITAMIN D3) 1000 UNITS CAPS Take 1 capsule by mouth daily. For your bones   Yes Historical Provider, MD  citalopram (CELEXA) 10 MG tablet Take 1 tablet (10 mg total) by mouth daily. 08/03/14  Yes Biagio Borg, MD  furosemide (LASIX) 40 MG tablet Take 1 tablet (40 mg total) by mouth daily. For swelling Patient taking differently: Take 40 mg by mouth as needed. For swelling 02/02/14  Yes Biagio Borg, MD  guaiFENesin (MUCINEX) 600 MG 12 hr tablet Take 1-2 tablet by mouth two times daily   Yes Historical Provider, MD  HYDROcodone-acetaminophen (NORCO) 5-325 MG per tablet Take 1 tablet by mouth every 6 (six) hours as needed for severe pain. 09/21/14  Yes Biagio Borg, MD  loratadine (CLARITIN) 10 MG tablet Take 1 tablet (10 mg total) by mouth daily. 10/20/14  Yes Biagio Borg, MD  Multiple Vitamins-Minerals (MULTIVITAMINS THER. W/MINERALS) TABS Take 1 tablet by mouth daily.   Yes Historical Provider, MD  pantoprazole (PROTONIX) 40 MG tablet Take 1 tablet (40  mg total) by mouth 2 (two) times daily. 09/21/14  Yes Biagio Borg, MD  polyethylene glycol powder [facility]) powder Take 17 g by mouth 2 (two) times daily. 06/22/13  Yes Willia Craze, NP  sennosides-docusate sodium (SENOKOT-S) 8.6-50 MG tablet Take 2 tablets by mouth daily as needed. For constipation   Yes Historical Provider, MD  azithromycin (ZITHROMAX) 250 MG tablet Take 1 tablet (250 mg total) by mouth as directed. 09/04/14   Tanda Rockers, MD  fluticasone (FLONASE) 50 MCG/ACT nasal spray Place 2 sprays into both nostrils daily. 10/20/14   Biagio Borg, MD  ipratropium (ATROVENT) 0.06 % nasal spray Place 2 sprays into both nostrils 4 (four) times daily. 11/12/14   Audelia Hives Presson, PA   traMADol (ULTRAM) 50 MG tablet Take 50 mg by mouth every 6 (six) hours as needed.    Historical Provider, MD   BP 162/75 mmHg  Pulse 70  Temp(Src) 98.2 F (36.8 C) (Oral)  Resp 18  SpO2 96% Physical Exam  Constitutional: She is oriented to person, place, and time. She appears well-developed and well-nourished. No distress.  HENT:  Head: Normocephalic and atraumatic.  Right Ear: Hearing, tympanic membrane, external ear and ear canal normal.  Left Ear: Hearing, tympanic membrane, external ear and ear canal normal.  Nose: Nose normal.  Mouth/Throat: Uvula is midline, oropharynx is clear and moist and mucous membranes are normal. No oral lesions. No trismus in the jaw. No uvula swelling.  Eyes: Conjunctivae are normal. No scleral icterus.  Neck: Normal range of motion. Neck supple.  Cardiovascular: Normal rate and regular rhythm.   Pulmonary/Chest: Effort normal and breath sounds normal. No stridor. No respiratory distress. She has no wheezes. She has no rales.  Musculoskeletal: Normal range of motion.  Lymphadenopathy:    She has no cervical adenopathy.  Neurological: She is alert and oriented to person, place, and time.  Skin: Skin is warm and dry.  Psychiatric: She has a normal mood and affect. Her behavior is normal.  Nursing note and vitals reviewed.   ED Course  Procedures (including critical care time) Labs Review Labs Reviewed - No data to display  Imaging Review No results found.   MDM   1. Post-nasal drainage    Recommend taking Mucinex BID instead of QID and adding Atrovent nasal spray as prescribed. Patient states she no longer wants to take Claritin as she feels it is not helping and I told her it would be fine to discontinue this medication.  PCP follow up if no improvement.    Lutricia Feil, [place] 11/12/14 [phone]

## 2014-11-12 NOTE — Discharge Instructions (Signed)
As we discussed, it is fine to stop taking the Claritin and if you would like to take your Mucinex twice a day instead of four times a day, that is fine as well. Try using the Atrovent nasal spray as prescribed and follow up with Dr. Jenny Reichmann if you have no improvement.

## 2014-11-12 NOTE — ED Notes (Signed)
C/o cough so hard it makes her stomach hurt x 2 mos.  States the sputum foamy, lumpy, slimy and bitter.  Gets hot and just feels bad, but has not checked her temp.  No runny nose.  Has a little sore throat.

## 2014-11-13 ENCOUNTER — Telehealth: Payer: Self-pay | Admitting: Pulmonary Disease

## 2014-11-13 NOTE — Telephone Encounter (Signed)
Error

## 2014-11-16 ENCOUNTER — Telehealth: Payer: Self-pay | Admitting: Gastroenterology

## 2014-11-16 NOTE — Telephone Encounter (Signed)
Line busy. Will try again later.

## 2014-11-20 ENCOUNTER — Other Ambulatory Visit: Payer: Self-pay | Admitting: Internal Medicine

## 2014-11-20 NOTE — Telephone Encounter (Signed)
C/o lower abdominal pain and weight loss.  She says this is a new pain.  Pain with meals.  She will come in and see Tye Savoy RNP

## 2014-11-21 NOTE — Telephone Encounter (Signed)
Called the patient to inform to pickup hardcopy at the front desk.Marland Kitchen

## 2014-11-21 NOTE — Telephone Encounter (Signed)
Done hardcopy to robin  

## 2014-11-22 ENCOUNTER — Ambulatory Visit (INDEPENDENT_AMBULATORY_CARE_PROVIDER_SITE_OTHER): Payer: Medicare Other | Admitting: Nurse Practitioner

## 2014-11-22 ENCOUNTER — Encounter: Payer: Self-pay | Admitting: Nurse Practitioner

## 2014-11-22 VITALS — BP 140/62 | HR 80 | Ht 61.5 in | Wt 130.0 lb

## 2014-11-22 DIAGNOSIS — G8929 Other chronic pain: Secondary | ICD-10-CM

## 2014-11-22 DIAGNOSIS — R1031 Right lower quadrant pain: Secondary | ICD-10-CM

## 2014-11-22 DIAGNOSIS — R1032 Left lower quadrant pain: Principal | ICD-10-CM

## 2014-11-22 NOTE — Patient Instructions (Addendum)
Please purchase an abdominal binder   Follow up as needed

## 2014-11-22 NOTE — Progress Notes (Signed)
     History of Present Illness:  Patient is a 81 year old female known to Dr. Fuller Plan. She has a history of esophageal dysmotility, GERD and chronic left sided abdominal pain with negative workup. Ms. Heaphy is back today with recurrent LLQ pain. Her bowels are moving well with Miralax and fiber. Urinating okay. Concerned that no one has been able to figure out what is wrong with her.  A good friend was found to have kidney cancer upon autopsy, this has her concerned. She has known back problems, recently got injection from Neurologist but doesn't think it helped. Ultram doesn't help the pain. She is also concerned about a productive cough that no one has figured out. Lately she has been having headaches. Her weight is down about 6 pounds, she is concerned about this.  Current Medications, Allergies, Past Medical History, Past Surgical History, Family History and Social History were reviewed in [employer] record. Physical Exam: General: Pleasant, well developed , black female in no acute distress Head: Normocephalic and atraumatic. Hard occipital lesion (size of 50 cent piece) Eyes:  sclerae anicteric, conjunctiva pink  Ears: Normal auditory acuity Lungs: Clear throughout to auscultation Heart: Regular rate and rhythm Abdomen: Soft, non distended, non-tender. No masses, no hepatomegaly. Normal bowel sounds Musculoskeletal: Symmetrical with no gross deformities  Extremities: No edema  Neurological: Alert oriented x 4, grossly nonfocal Psychological:  Alert and cooperative. Normal mood and affect  Assessment and Recommendations:   81 year old female with chronic lower abdominal pain. Workup through the years has been unremarkable. Long discussion with Ms. Sutliff regarding her abdominal pain. Reassured her that no cancers or life threatening problems found so far. She needs a new abdominal binder but doesn't know where to get one,  I recommended a medical supply store. She  will follow up with [place] as needed.

## 2014-11-23 ENCOUNTER — Encounter: Payer: Self-pay | Admitting: Nurse Practitioner

## 2014-11-23 DIAGNOSIS — G8929 Other chronic pain: Secondary | ICD-10-CM | POA: Insufficient documentation

## 2014-11-23 DIAGNOSIS — R1032 Left lower quadrant pain: Principal | ICD-10-CM

## 2014-11-23 NOTE — Progress Notes (Signed)
Reviewed and agree with management plan. Her chronic LLQ pain has been thoroughly evaluated from a GI standpoint. No further GI evaluation or GI follow up is needed. She should return to her PCP for ongoing care. Future GI appointments only by referral from her PCP.   Heather [name]. Fuller Plan, MD [facility]

## 2014-11-27 ENCOUNTER — Ambulatory Visit (INDEPENDENT_AMBULATORY_CARE_PROVIDER_SITE_OTHER): Payer: Medicare Other | Admitting: Nurse Practitioner

## 2014-11-27 ENCOUNTER — Encounter: Payer: Self-pay | Admitting: Nurse Practitioner

## 2014-11-27 VITALS — BP 112/60 | HR 79 | Temp 98.0°F | Ht 62.0 in | Wt 130.0 lb

## 2014-11-27 DIAGNOSIS — J329 Chronic sinusitis, unspecified: Secondary | ICD-10-CM

## 2014-11-27 DIAGNOSIS — R0982 Postnasal drip: Secondary | ICD-10-CM

## 2014-11-27 NOTE — Progress Notes (Signed)
   Subjective:    Patient ID: Heather [name], female    DOB: [birth year], 81 y.o.   MRN: [id number]  HPI Comments: Pt is accompanied by her son. She describes not a cough, but the need to clear secretions from throat, bad taste. She also wants to discuss weight loss and long standing abdominal pain, for which she saw GI last week. Per GI notes, no GI cause for pain found. It was suggested she discuss pain w/PCP.    Cough This is a chronic (1 year) problem. The current episode started more than 1 year ago. The problem has been gradually worsening. The cough is productive of sputum. Associated symptoms include nasal congestion and postnasal drip. Pertinent negatives include no chest pain, chills, ear congestion, ear pain, fever, headaches, heartburn, hemoptysis, sore throat, shortness of breath or wheezing. Nothing aggravates the symptoms. Treatments tried: uses nasal spray-not sure what it is. The treatment provided no relief.      Review of Systems  Constitutional: Negative for fever, chills, appetite change and fatigue.  HENT: Positive for congestion and postnasal drip. Negative for ear pain and sore throat.   Respiratory: Positive for cough. Negative for hemoptysis, shortness of breath and wheezing.   Cardiovascular: Negative for chest pain.  Gastrointestinal: Negative for heartburn.  Neurological: Negative for headaches.       Objective:   Physical Exam  Constitutional: She is oriented to person, place, and time. She appears well-developed and well-nourished. No distress.  HENT:  Head: Normocephalic and atraumatic.  Right Ear: External ear normal.  Left Ear: External ear normal.  Mouth/Throat: Oropharynx is clear and moist. No oropharyngeal exudate.  Eyes: Conjunctivae are normal. Right eye exhibits no discharge. Left eye exhibits no discharge.  Neck: Normal range of motion. Neck supple. No thyromegaly present.  Cardiovascular: Normal rate, regular rhythm and normal heart  sounds.   No murmur heard. Pulmonary/Chest: Effort normal and breath sounds normal. No respiratory distress. She has no wheezes. She has no rales.  Lymphadenopathy:    She has no cervical adenopathy.  Neurological: She is alert and oriented to person, place, and time.  Skin: Skin is warm and dry.  Psychiatric: She has a normal mood and affect. Her behavior is normal. Thought content normal.  Vitals reviewed.         Assessment & Plan:  1. Postnasal discharge Start sinus rinses to clear post nasal drip F/u if no improvement

## 2014-11-27 NOTE — Progress Notes (Signed)
Pre visit review using our clinic review tool, if applicable. No additional management support is needed unless otherwise documented below in the visit note. 

## 2014-11-27 NOTE — Patient Instructions (Signed)
Please start using daily sinus rinse (Neilmed sinus Rinse). You can buy it over the counter. Use it once or twice daily to clear mucous from sinuses & throat.  Read package insert for complete instructions.  Please see Dr Jenny Reichmann, regarding abdominal pain.

## 2014-11-30 ENCOUNTER — Ambulatory Visit (INDEPENDENT_AMBULATORY_CARE_PROVIDER_SITE_OTHER): Payer: Medicare Other | Admitting: Internal Medicine

## 2014-11-30 ENCOUNTER — Encounter: Payer: Self-pay | Admitting: Internal Medicine

## 2014-11-30 ENCOUNTER — Other Ambulatory Visit (INDEPENDENT_AMBULATORY_CARE_PROVIDER_SITE_OTHER): Payer: Medicare Other

## 2014-11-30 VITALS — BP 124/70 | HR 75 | Temp 98.1°F | Ht 62.0 in | Wt 132.5 lb

## 2014-11-30 DIAGNOSIS — R1084 Generalized abdominal pain: Secondary | ICD-10-CM

## 2014-11-30 DIAGNOSIS — I10 Essential (primary) hypertension: Secondary | ICD-10-CM

## 2014-11-30 DIAGNOSIS — R109 Unspecified abdominal pain: Secondary | ICD-10-CM | POA: Insufficient documentation

## 2014-11-30 DIAGNOSIS — I5032 Chronic diastolic (congestive) heart failure: Secondary | ICD-10-CM

## 2014-11-30 DIAGNOSIS — K224 Dyskinesia of esophagus: Secondary | ICD-10-CM

## 2014-11-30 LAB — URINALYSIS, ROUTINE W REFLEX MICROSCOPIC
Bilirubin Urine: NEGATIVE
Ketones, ur: NEGATIVE
Nitrite: NEGATIVE
Specific Gravity, Urine: 1.025 (ref 1.000–1.030)
TOTAL PROTEIN, URINE-UPE24: NEGATIVE
URINE GLUCOSE: NEGATIVE
Urobilinogen, UA: 0.2 (ref 0.0–1.0)
pH: 6 (ref 5.0–8.0)

## 2014-11-30 LAB — CBC WITH DIFFERENTIAL/PLATELET
Basophils Absolute: 0 10*3/uL (ref 0.0–0.1)
Basophils Relative: 0.6 % (ref 0.0–3.0)
EOS PCT: 1 % (ref 0.0–5.0)
Eosinophils Absolute: 0.1 10*3/uL (ref 0.0–0.7)
HCT: 43.2 % (ref 36.0–46.0)
Hemoglobin: 14 g/dL (ref 12.0–15.0)
Lymphocytes Relative: 42.9 % (ref 12.0–46.0)
Lymphs Abs: 2.9 10*3/uL (ref 0.7–4.0)
MCHC: 32.3 g/dL (ref 30.0–36.0)
MCV: 96 fl (ref 78.0–100.0)
MONO ABS: 0.8 10*3/uL (ref 0.1–1.0)
MONOS PCT: 11 % (ref 3.0–12.0)
NEUTROS ABS: 3.1 10*3/uL (ref 1.4–7.7)
NEUTROS PCT: 44.5 % (ref 43.0–77.0)
Platelets: 210 10*3/uL (ref 150.0–400.0)
RBC: 4.51 Mil/uL (ref 3.87–5.11)
RDW: 14.9 % (ref 11.5–15.5)
WBC: 6.9 10*3/uL (ref 4.0–10.5)

## 2014-11-30 LAB — BASIC METABOLIC PANEL
BUN: 19 mg/dL (ref 6–23)
CALCIUM: 9.2 mg/dL (ref 8.4–10.5)
CO2: 31 mEq/L (ref 19–32)
CREATININE: 0.99 mg/dL (ref 0.40–1.20)
Chloride: 103 mEq/L (ref 96–112)
GFR: 67.11 mL/min (ref >60.00)
Glucose, Bld: 100 mg/dL — ABNORMAL HIGH (ref 70–99)
Potassium: 4.4 mEq/L (ref 3.5–5.1)
Sodium: 138 mEq/L (ref 135–145)

## 2014-11-30 LAB — HEPATIC FUNCTION PANEL
ALK PHOS: 40 U/L (ref 39–117)
ALT: 22 U/L (ref 0–35)
AST: 25 U/L (ref 0–37)
Albumin: 4.1 g/dL (ref 3.5–5.2)
BILIRUBIN TOTAL: 0.8 mg/dL (ref 0.2–1.2)
Bilirubin, Direct: 0.1 mg/dL (ref 0.0–0.3)
TOTAL PROTEIN: 6.8 g/dL (ref 6.0–8.3)

## 2014-11-30 LAB — LIPASE: LIPASE: 38 U/L (ref 11.0–59.0)

## 2014-11-30 LAB — TSH: TSH: 2.67 u[IU]/mL (ref 0.35–4.50)

## 2014-11-30 NOTE — Progress Notes (Signed)
Subjective:    Patient ID: Heather [name], female    DOB: August 27, 1918, 81 y.o.   MRN: [id number]  HPI  Here to f/u, still with ongoing GI complaints as before, has known esoph dysmotility.  Seen at [facility] last Sunday with tx for post nasal gtt. Pt denies chest pain, increased sob or doe, wheezing, orthopnea, PND, increased LE swelling, palpitations, dizziness or syncope.  Cont's to be concerned "wt loss" but no recent wt loss documented Wt Readings from Last 3 Encounters:  11/30/14 132 lb 8 oz (60.102 kg)  11/27/14 130 lb (58.968 kg)  11/22/14 130 lb (58.968 kg)  Denies worsening reflux, vomiting. bowel change or blood, though has bitter taste. Pt denies chest pain, increased sob or doe, wheezing, orthopnea, PND, increased LE swelling, palpitations, dizziness or syncope. Past Medical History  Diagnosis Date  . RBBB (right bundle branch block with left anterior fascicular block)   . Hypertension   . Hiatal hernia   . Gastritis, chronic   . Breast cancer     NO STICK OR BLOOD PRESSURE CHECKS IN LEFT ARM  . Degenerative joint disease   . Anxiety state, unspecified   . Low back pain   . Carpal tunnel syndrome   . Renal cyst   . Diverticula of colon   . Cervical spondylosis   . Diastolic dysfunction   . Venous insufficiency   . Constipation   . Colon polyp 12/19/2009    Transverse-polypoid colorectal mucosa  . Allergic rhinitis, cause unspecified 02/21/2014   Past Surgical History  Procedure Laterality Date  . Left mastectomy    . Carpal tunnel release      reports that she has never smoked. She has never used smokeless tobacco. She reports that she does not drink alcohol or use illicit drugs. family history is negative for Colon cancer. Allergies  Allergen Reactions  . Fentanyl     REACTION: nausea   Current Outpatient Prescriptions on File Prior to Visit  Medication Sig Dispense Refill  . ALPRAZolam (XANAX) 0.5 MG tablet TAKE 1/2 TO 1 TABLET THREE TIMES DAILY AS NEEDED 90  tablet 5  . amLODipine (NORVASC) 2.5 MG tablet Take 1 tablet (2.5 mg total) by mouth daily. 90 tablet 3  . aspirin EC 81 MG tablet Take 81 mg by mouth daily.    . carvedilol (COREG) 3.125 MG tablet Take 1 tablet (3.125 mg total) by mouth 2 (two) times daily. 180 tablet 3  . Cholecalciferol (VITAMIN D3) 1000 UNITS CAPS Take 1 capsule by mouth daily. For your bones    . citalopram (CELEXA) 10 MG tablet Take 1 tablet (10 mg total) by mouth daily. 90 tablet 3  . fluticasone (FLONASE) 50 MCG/ACT nasal spray Place 2 sprays into both nostrils daily. 16 g 2  . furosemide (LASIX) 40 MG tablet Take 1 tablet (40 mg total) by mouth daily. For swelling (Patient taking differently: Take 40 mg by mouth as needed. For swelling) 90 tablet 3  . guaiFENesin (MUCINEX) 600 MG 12 hr tablet Take 1-2 tablet by mouth two times daily    . HYDROcodone-acetaminophen (NORCO/VICODIN) 5-325 MG per tablet TAKE ONE TABLET EVERY 6 HOURS AS NEEDED FOR SEVERE PAIN 30 tablet 0  . Multiple Vitamins-Minerals (MULTIVITAMINS THER. W/MINERALS) TABS Take 1 tablet by mouth daily.    . pantoprazole (PROTONIX) 40 MG tablet Take 1 tablet (40 mg total) by mouth 2 (two) times daily. 60 tablet 11  . polyethylene glycol powder (GLYCOLAX) powder Take 17 g  by mouth 2 (two) times daily. 527 g 1   No current facility-administered medications on file prior to visit.   Review of Systems  Constitutional: Negative for unusual diaphoresis or other sweats  HENT: Negative for ringing in ear Eyes: Negative for double vision or worsening visual disturbance.  Respiratory: Negative for choking and stridor.   Gastrointestinal: Negative for vomiting or other signifcant bowel change Genitourinary: Negative for hematuria or decreased urine volume.  Musculoskeletal: Negative for other MSK pain or swelling Skin: Negative for color change and worsening wound.  Neurological: Negative for tremors and numbness other than noted  Psychiatric/Behavioral: Negative for  decreased concentration or agitation other than above       Objective:   Physical Exam BP 124/70 mmHg  Pulse 75  Temp(Src) 98.1 F (36.7 C) (Oral)  Ht 5\' 2"  (1.575 m)  Wt 132 lb 8 oz (60.102 kg)  BMI 24.23 kg/m2  SpO2 96% VS noted,  Constitutional: Pt appears well-developed, well-nourished.  HENT: Head: NCAT.  Right Ear: External ear normal.  Left Ear: External ear normal.  Eyes: . Pupils are equal, round, and reactive to light. Conjunctivae and EOM are normal Neck: Normal range of motion. Neck supple.  Cardiovascular: Normal rate and regular rhythm.   Pulmonary/Chest: Effort normal and breath sounds without rales or wheezing.  Abd:  Soft, NT, ND, + BS Neurological: Pt is alert. Not confused , motor grossly intact Skin: Skin is warm. No rash Psychiatric: Pt behavior is normal. No agitation.     Assessment & Plan:

## 2014-11-30 NOTE — Progress Notes (Signed)
Pre visit review using our clinic review tool, if applicable. No additional management support is needed unless otherwise documented below in the visit note. 

## 2014-11-30 NOTE — Patient Instructions (Signed)

## 2014-12-02 NOTE — Assessment & Plan Note (Signed)
stable overall by history and exam, recent data reviewed with pt, and pt to continue medical treatment as before,  to f/u any worsening symptoms or concerns BP Readings from Last 3 Encounters:  11/30/14 124/70  11/27/14 112/60  11/22/14 140/62

## 2014-12-02 NOTE — Assessment & Plan Note (Signed)
stable overall by history and exam, recent data reviewed with pt, and pt to continue medical treatment as before,  to f/u any worsening symptoms or concerns Lab Results  Component Value Date   WBC 6.9 11/30/2014   HGB 14.0 11/30/2014   HCT 43.2 11/30/2014   PLT 210.0 11/30/2014   GLUCOSE 100* 11/30/2014   CHOL 199 02/02/2014   TRIG 20.0 02/02/2014   HDL 98.70 02/02/2014   LDLCALC 96 02/02/2014   ALT 22 11/30/2014   AST 25 11/30/2014   NA 138 11/30/2014   K 4.4 11/30/2014   CL 103 11/30/2014   CREATININE 0.99 11/30/2014   BUN 19 11/30/2014   CO2 31 11/30/2014   TSH 2.67 11/30/2014

## 2014-12-02 NOTE — Assessment & Plan Note (Signed)
Chronic stable, cont current tx, benign exam, pt reassured,  to f/u any worsening symptoms or concerns

## 2014-12-02 NOTE — Assessment & Plan Note (Signed)
Unfortunately little to offer at this time,  GI states nothing further to offer, to f/u any worsening symptoms or concerns

## 2014-12-05 ENCOUNTER — Telehealth: Payer: Self-pay | Admitting: Internal Medicine

## 2014-12-05 NOTE — Telephone Encounter (Signed)
Pt requesting a call with her lab results. 

## 2014-12-05 NOTE — Telephone Encounter (Signed)
Pt informed of lab results. 

## 2014-12-07 ENCOUNTER — Ambulatory Visit: Payer: Medicare Other | Admitting: Internal Medicine

## 2014-12-12 ENCOUNTER — Telehealth: Payer: Self-pay | Admitting: Internal Medicine

## 2014-12-12 DIAGNOSIS — K224 Dyskinesia of esophagus: Secondary | ICD-10-CM

## 2014-12-12 NOTE — Telephone Encounter (Signed)
Referral done

## 2014-12-12 NOTE — Telephone Encounter (Signed)
Patient requesting referral to gastroent. . She specifically requested Dr. Juanita Craver @ [facility] on [address]

## 2015-02-01 ENCOUNTER — Ambulatory Visit: Payer: Medicare Other | Admitting: Internal Medicine

## 2015-03-16 ENCOUNTER — Encounter: Payer: Self-pay | Admitting: Gastroenterology

## 2015-03-20 ENCOUNTER — Telehealth: Payer: Self-pay

## 2015-03-20 NOTE — Telephone Encounter (Signed)
Call to number given and lvm that patient can have annual wellness assessment, but this would also include assistance with any issues the patient is having currently related to falls; safety; barriers to care etc. To Call back for more information. ( To note; no AD on file)

## 2015-03-21 ENCOUNTER — Other Ambulatory Visit: Payer: Self-pay | Admitting: Physician Assistant

## 2015-03-21 DIAGNOSIS — R1310 Dysphagia, unspecified: Secondary | ICD-10-CM

## 2015-03-22 ENCOUNTER — Ambulatory Visit (INDEPENDENT_AMBULATORY_CARE_PROVIDER_SITE_OTHER): Payer: Medicare Other | Admitting: Pulmonary Disease

## 2015-03-22 ENCOUNTER — Encounter: Payer: Self-pay | Admitting: Pulmonary Disease

## 2015-03-22 ENCOUNTER — Telehealth: Payer: Self-pay | Admitting: Pulmonary Disease

## 2015-03-22 ENCOUNTER — Ambulatory Visit (INDEPENDENT_AMBULATORY_CARE_PROVIDER_SITE_OTHER)
Admission: RE | Admit: 2015-03-22 | Discharge: 2015-03-22 | Disposition: A | Discharge status: Home / Self Care | Payer: Medicare Other | Source: Ambulatory Visit | Attending: Pulmonary Disease | Admitting: Pulmonary Disease

## 2015-03-22 VITALS — BP 148/62 | HR 66 | Temp 97.8°F | Wt 120.0 lb

## 2015-03-22 DIAGNOSIS — K449 Diaphragmatic hernia without obstruction or gangrene: Secondary | ICD-10-CM | POA: Diagnosis not present

## 2015-03-22 DIAGNOSIS — J302 Other seasonal allergic rhinitis: Secondary | ICD-10-CM

## 2015-03-22 DIAGNOSIS — K219 Gastro-esophageal reflux disease without esophagitis: Secondary | ICD-10-CM

## 2015-03-22 DIAGNOSIS — F411 Generalized anxiety disorder: Secondary | ICD-10-CM

## 2015-03-22 DIAGNOSIS — J387 Other diseases of larynx: Secondary | ICD-10-CM

## 2015-03-22 DIAGNOSIS — J411 Mucopurulent chronic bronchitis: Secondary | ICD-10-CM

## 2015-03-22 DIAGNOSIS — F450 Somatization disorder: Secondary | ICD-10-CM

## 2015-03-22 DIAGNOSIS — K2100 Gastro-esophageal reflux disease with esophagitis, without bleeding: Secondary | ICD-10-CM

## 2015-03-22 NOTE — Telephone Encounter (Signed)
Called spoke with pt. She wanted [place] to fix she see's Dr. Lysle Rubens now and not Dr. Jenny Reichmann. Nothing further needed

## 2015-03-22 NOTE — Progress Notes (Signed)
Subjective:    Patient ID: Heather [name], female    DOB: 1917-12-08, 81 y.o.   MRN: [id number]  HPI 81 y/o BF here for a follow up visit... she has multiple medical problems including:  Dyspnea & Anxiety;  HBP;  Hx CP followed by DrBensimhon;  HH/ Gastritis;  Divertics/ Constip;  Functional Abd Pain;  Renal cyst followed by DrKimbrough;  Hx left breast [phone];  DJD/ CSpine spondy/ LBP/ CTS; and she has a cancer phobia...   ~  SEE PREV EPIC NOTES FOR OLDER DATA >> She has established Primary Care follow up w/ DrJohn => DrHusain, Leodis Binet...  ~  September 06, 2013:  6wk [place] says "I'm sick, so clogged up, I can feel all my guts" (says she can feel her intestines, but has lost her stomach) w/ her usual mult somatic complaints but few physical findings;  We decided to do CXR, Ambulatory O2 check, and non-fasting labs today...     We reviewed prob list, meds, xrays and labs> see below for updates >> She wants handicap sticker- OK...  CXR 10/14 showed normal heart size w/ tortuous atherosclerotic Ao, clear lungs w/ ?vague opac left mid zone adjacent to hilum (nothing seen in this area on CT 3/14) ?overlying shadows & we will f/u...  Ambulatory O2 check> O2 sat= 98% on room air at rest w/ pulse=83, regular; Ambulated 3 laps w/ lowest O2 sat= 96% w/ pulse=108...  LABS 10/14:  Chems- wnl;  CBC- wnl;  TSH=2.04;  VitD=71;  Sed=11...   ~  December 07, 2013:  13mo ROV & [place] has no new complaints today; she voiced concern over prev [place] visit w/ N&V, had IV, ?got infected "they had to operate on my hand", but her CC is the bill from [employer]... She persists w/ GI complaints> gas, N&V, swelling, etc;  Throat symptoms and she's seen DrCrossley;  LBP and she's followed by DrWhitfield, shots w/o help she says;  Cancer phobia as we've noted in the past but she is unconsolable...     HBP> on Coreg3.125Bid, Amlod2.5, Lisin40; BP= 100/60 & she persists w/ mult somatic complaints; last  2DEcho 10/2009 showed norm LV size & func w/ EF=55-60%, mildAI...    HxCP> on ASA81; followed by DrBensimhon/ DrNishan for Cards & their prev notes are reviewed=> neg ischemic work up previously...     VI, Edema> she went to the ER 11/13 w/ edema; VDopplers were neg for DVT; they reiterated the recs for no salt, elevation, support hose, & continue Lasix40.     GI- HH, Gastirits, Divertics, chronic pain & "swelling"> on Omep20mg /d plus Miralax & Senakot-S; she persists w/ mult GI complaints despite prev evals by [facility], [facility], [facility]-DrThorne...    Renal cyst> she is very anxious about everything & has a cancer phobia; DrKimbrough followed this benign simple cyst for yrs- no change...     Hx Breast cancer> she had left mod radical mastectomy 1994 by DrBlievernicht- no known recurrence & she is given reassurance...     Ortho- DJD, Cspine spondy, LBP, CTS> followed by DrWhitfield & he injected her right knee & her back; on Vicodin & Tramadol but prefers the latter she says...     Anxiety> on Alpraz0.5mg Tid but "I'm not taking it very often" she says & encouraged to use more regularly...  We reviewed prob list, meds, xrays and labs> see below for updates >> she wants a ZPak for the drainage in her throat...  ~  Apr 04, 2014:  57mo ROV & [name] has established w/ DrJohn for Primary care and has seen him x3 already... She still sees DrCrosslry for ENT and DrStark for GI>>    From the Pulmonary standpoint she is c/o similar symptoms to prev- cough, harking up thick balls of white phlegm "it's so bad- big lumps, it's so slimy that it just jumps out, it's unbearable"; she notes some mild SOB/DOE but not progressive & by all accounts she gets along remarkably well for 81 y/o; she denies f/c/s but notes "I get hot"; she denies CP, palpit, dizzy, edema; prev asked to take Mucinex but she wouldn't do it because DrCrossley told her not to for some reason but she is no better on his regimen...    BASELINE DATA>>    Baseline CXRs showed sl overinflation, clear lungs, normal heart size w/ elongation & tortuous Ao...  Prev CT Chest/ CTA 3/14 showed no PE, no acute findings, +atherosclerotic changes, several scattered tiny nodules 3-21mm size & no change from 2009 scan, s/p left mastectomy, DJD Tspine...   PFTs 9/13 were WNL> FVC=2.62 (128%), FEV1=2.20 (158%), %1sec=84; mid-flows=274% of predicted  CXR 04/04/14 shows stable heart size, atherosclerotic & tortuous Ao, mild hyperinflation, clear lungs w/o acute changes, DJD Tspine...  PLAN:  She is asked to start [facility] 600mg  one tab Qid w/ plenty of water; and DELSYM 2 tsp po Qhs...  I tried to give her maximum reassurance & we will recheck her in 3 months...  ~  July 03, 2014:  9mo ROV & in the interval [place]airis has seen DrJohn x3, DrStark x1, and went to the [facility] (all notes reviewed)-  regarding her abd discomfort, wt loss, LPR/ coking/ coughing...    From the pulmonary standpoint she says she is breathing OK, then she says she meant "terrible" and notes the "I hark a whole lot" stating the phlegm is thick & bitter w/ big lumps & wakes her up at 3-4AM; she cannot articulate any real SOB/DOE & she remains active;  She stopped the prev Mucinex and uses Delsym prn- as noted prev PFT in 2013 was wnl;  She has mult mult somatic complaints...  We discussed rechecking PFT & Ambulatory O2 sat test today...  PFT > despite mult attempts w/ several test administrators/ staff- she was unable to perform the procedure due to inability to cooperate...  Ambulatory O2sat Test on RA>  Baseline O2sat= 99% w/ pulse=86;  After 3 laps her nadir O2sat= 96% w/ max pulse= 114... PLAN>> we decided to add HUMIBID-DM one cap Bid w/ Fluids (she won't take Mucinex due to something that DrCrossley said yrs ago); we reviewed the important ANTI-REFLUX regimen to help her nocturnal symptoms...  ~  September 21, 2014:  31mo ROV & pt added-on today at her request for mult symptoms> but on  questioning it is apparent that her symptomatology is chronic w/o acute features; she notes cough ("hocking cough"), thick white sputum ("with white balls"), chest congestion; but she denies to me any f/c/s, CP, hemoptysis, etc and she notes that "my breathing is good";;;  Then she launches into a litany of complaints- poor appetite, wt loss ("I'm nothing but a frame"), back pain ("the drawing kind"), and general flight of ideas;  Meds reviewed & include prev ZPak & Mucinex (?taking 1 Qid);  Exam is neg w/ clear chest- no wheezing, rales, or rhonchi hear;  Last CXR was 5/15 showing essentially clear lungs, NAD...    She saw Cherly Hensen for Cards 9/15> Hx  HBP, CHF/ DD, AtypCP, RBBB, neg Myoview 2009; on Coreg3.125Bid, Amlod2.5, Lasix40 as needed for swelling; BP= 112/60 today...     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     Hx mult Ortho complaints and LBP evaluated by DrWhitfield w/ Epid shots per DrNewton but she claims no improvement; notes Tramadol doesn't help and want Vicodin refilled, she wears a back brace...     Very anxoius and has Celexa10 & Alprazolam 0.5mg  on her med list...   EKG 9/15 showed NSR, rate81, LAD, RBBB, NSSTTWA, NAD...  PLAN>>  Tabbitha is reassured- her breathing is stable w/o acute lung problems at this time; she is asked to continue the Mucinex600mg  Qid w/ fluids, and remain as active as possible; reminded to use her ProtonixBid & follow antireflux regimen to help her LPR;  She will f/u w/ her other physicians and see me in 6 months for re-evaluation & f/u CXR...  ~  Mar 22, 2015:  27mo ROV & [place] says "I'm just skin & bones" weight is noted to be down 15# to 120# today (BMI=22);  She says that her dentist noted her teeth are wearing out & this may acct for some wt loss- needs to incr nutritional supplement like  BOOST Bid;  She persists w/ mult medical complaints- "this swallowing business" and she is seeing both [facility] and [facility] for help w/ reflux and cancer phobia "it can show up anywhere";  From the resp standpoint she notes same "hocking" throat clearing cough w/ thick, white, foamy sput; she notes SOB/DOE w/o change and she is still not taking the Alpraz or the Mucinex as requested...      She saw DrJohn 1/16> note reviewed & felt to be stable... She tells me she has switched Primary Care to [facility]...      She saw [facility] 1/16 and they felt no further GI w/u warranted, use abd binder prn...      She saw DrEdwards, GI 2/16> bloating, constip, wt loss> note reviewed EXAM reveals Afeb, VSS, O2sat=99% on RA;  HEENT- neg x sl dry MMs;  Chest- she has a forced dry hacking cough that is nonproductive, clear w/o w/r/r;  Heart- RR w/o m/r/g heard;  Abd- soft, nontender, neg...  We reviewed prob list, meds, xrays and labs> see below for updates >>   LABS 11/2014 were WNL.Marland KitchenMarland Kitchen  CXR 03/22/15 showed stable cardiomeg, chronic changes, old right rib fxs, NAD...  PLAN>>  Please Nataley take the Mucinex600mg - one tab Qid w/ fluids, and try the Alprazolam 0.5mg  1/2 tab Tid to start; I have tried to reassure her as best I can, she needs to eat better & take the nutritional supplement Bid...  (NOTE> I spent 45min face-to-face w/ pt discussing her symptoms, examining pt, reviewing recommendations, and answering questions)           Problem List:  RESPIRATORY SYMPTOMS >> c/o "hocking phlegm" DYSPNEA (ICD-786.9) - eval 5/11 c/o SOB- can't get a DB, can't get enough air in, etc... occurs at rest & w/ exerc "I'm very active despite my arthritis", talks rapidly in long sentences w/o apparent distress... no cough, phlegm, hemoptysis, etc- but as usual she is quite distressed by these symptoms and wants further eval> we discussed re-assessment w/ CXR (clear, NAD);  PFT (totally norm airflow);  Labs (all WNL);  Rx w/ ALPRAZOLAM  0.5mg  Tid & encouraged to take regularly. ~  CXR 4/13 showed norm heart size, clear lungs, mild TSpine spondylosis... ~  She continues to complain of drainage, throat symptoms, etc; Rx w/ Mucinex, OTC antihist, Nasonex, etc;  Seen by ENT, DrRosen w/ ?xerostomia & rec incr water etc. ~  9/13: Add-on visit for "hocking, it's not a cough, it's a hock"> see above... ~  9/13: CXR shows sl overinflation, clear lungs, normal heart size w/ elongation & tortuous Ao;  PFTs are wnl> FVC=2.62 (128%), FEV1=2.20 (158%), %1sec=84; mid-flows=274% of predicted... We discussed trial rx for her symptoms- Mucinex, Fluids, Hycodan, Dulera100- 1puffbid. ~  10/13: she reports [facility] to [facility] w/ throat & mouth symptoms "like glue" therefore stopped this med; advised to continue the others. ~  3/14:  CT Chest (done after call from Ortho PA regarding her symptoms)=> then CTA (done via an ER visit) showed no PE, no acute findings, +atherosclerotic changes, several scattered tiny nodules 3-60mm size & no change from 2009 scan, s/p left mastectomy, DJD Tspine...  ~  4/14: she is asked to increase the Mucinex600mg  to [facility] + plenty of water daily... ~  10/14: CXR showed normal heart size w/ tortuous atherosclerotic Ao, clear lungs w/ ?vague opac left mid zone adjacent to hilum (nothing seen in this area on CT 3/14) ?overlying shadows & we will f/u...  ~  CXR 04/04/14 shows stable heart size, atherosclerotic & tortuous Ao, mild hyperinflation, clear lungs w/o acute changes, DJD Tspine... ~  8/15 & 11/15> she remains stable from the pulmonary standpoint on Mucinex600Qid + Fluids; rec to follow better antireflux regimen for her cough, LPR, choking...  ~  CXR 03/22/15 showed stable cardiomeg, chronic changes, old right rib fxs, NAD    SHE HAS ESTABLISHED w/ DrJOHN from [facility] ==> [facility]Husain at [facility].   HYPERTENSION (ICD-401.9) - on AMLODIPINE 2.5mg /d,  COREG 3.125mg Bid,  LASIX 40mg - ?taking 1/2tab/d or just using  it prn swelling...  Hx of CHEST PAIN (ICD-786.50) - she takes ASA 81mg /d + Tylenol Prn... followed by DrBensimhon for Cards. ~  Baseline EKG w/ RBBB & LAD, no acute changes... ~  NuclearStressTest done 2007 & 2009 showed normal- without scar or ischemia, EF=67%...   ~  2DEcho 7/08 showed mild AI, mild diastolic dysfunction, BE=01-00%. ~  CT Abd 1/09 & 9/12 showed incidental coronary calcif, & aorto-iliac atherosclerotic changes as well... ~  8/13: she saw DrNishan> HBP, DD, atypCP w/ neg Myoviews & no changes made... ~  10/13:  She uses a back brace/ abd binder to help her CWP... ~  EKG 9/15 showed NSR, rate81, LAD, RBBB, NSSTTWA, NAD...  VENOUS INSUFFICIENCY (ICD-459.81) - she has mod VI and follows a low sodium diet, elevates legs when able, and wears support hose. ~  7/12:  C/o incr edema & we reviewed a 2gm sodium diet & increased her Lasix from 20mg /d to 40mg /d... ~  5/13:  She has persist complaints but mild chr VI w/ min edema & reminded to elim sodium, elev, support hose, take the Lasix... ~ 11/13: she went to the ER w/ edema; VDopplers were neg for DVT; they reiterated the recs for no salt, elevation, support hose, & continue Lasix40.  HIATAL HERNIA (ICD-553.3) & GASTRITIS (ICD-535.50) >>  ~  on OMEPRAZOLE 20mg /d & followed by DrJEdwards for GI having fired DrStark & [facility] in past. ~  last EGD was 2/08 w/ 3cm HH & gastitis... ~  CT Abd 1/09 revealed calcif gallstones, & mild ectasia  of AA... ~  CT Abd 1/11 showed left colon divertics, tiny hepatic lesions w/o change & right renal cyst stable. ~  CT Abd 9/12 showed divertics (no inflamm), right renal & left hep lobe cysts, atherosclerotic changes, NAD... ~  Seen by DrEdwards 4/13> c/o dysphagia & congestion; he notes mult GI complaints, known esoph dysmotility w/o stricture, hard to pin down her complaints... ~  She has followed up w/ DrStark regarding her concerns about swelling in her abd... ~  CT Abd Pelvis 3/14 showed Tiny cysts in  the lateral segment left hepatic lobe, 1.4 cm right upper pole renal cyst, atherosclerotic calcifications of the abdominal aorta and branch vessels, DJD spine... ~  2015: now on Protonix40Bid but not taking regularly & advised BID dosing 7 antireflux regimen for her LPR...   DIVERTICULOSIS, COLON (ICD-562.10) - followed regularly by DrStark. ~  colonoscopy 7/03 by DrStark showing divertics only... we discussed Miralax + Senakot-S for constipation. ~  colonoscopy 2/11 showed divertics, 77mm polyp, hems...  ? of ABDOMINAL PAIN & SYMPTOMATOLOGY - she had a very thorough work up in 2009> she has a cancer phobia and is very difficult to reassure her that everything appropriate has been done & no cancer found... Mult additional GI evals since then from [facility], [facility] (DrEdwards), [facility] (DrThorne)... ~  eval by GI- DrStark 12/08 but pt wasn't satisfied w/ his examination... ~  full labs 1/09 were WNL, sed=20... ~  CT Abd/ Pelvis 1/09= no acute abn in abd (calcif gallstones & mild ectasia of AA), calcif fibroid in the pelvis, & ?regarding the left ovary. ~  referral to DrFontaine, GYN> his notes are reviewed... ~  follow up w/ DrKimbrough, Urology> his notes are reviewed... ~  eval DrWhitfield for Orthopedics- rec shots in back by [facility] w/ some improvement. ~  repeat GI eval 1/11 by DrStark w/ CT & colonoscpy as above- no acute problems. ~  5/11 & 7/11 >> persist complaints w/ neg recheck by DrStark & DrKimbrough... ~  12/11:  routine GYN surveillance DrFontaine found anteverted uterus w/ fibroids, atrophic ovaries bilat, no mass. ~  Repeat GI eval 4/12 by DrStark> he tried to reassure the pt, felt some symptoms might be from poor abd wall muscle tone, rec continue Omep, laxatives... ~  6/12:  She sought another GI opinion DrEdwards> he did not feel she needed any additional testing... ~  She tells me she has yet another appt to see a gastroenterologist at [facility]==> seen by DrThorne 8/12 & note  reviewed... ~  EGD by DrThorne 9/12 w/ mult whitish nodular plaques in cardia & fundus> Bx results pending & she is very concerned (remember her hx cancer phobia). ~  She continues to f/u w/ DrThorne at [facility] & is happy w/ her eval... ~  Now back w/ DrStark at [facility]...  RENAL CYST (ICD-593.2) - she has a 1 cm right renal cyst seen on CT in 2007 and followed by DrKimbrough... f/u CT Abd 1/09 - no change in the cyst... f/u renal ultrasound 1/10 w/ small bilat cysts... f/u CT Abd 1/11- no change in cyst. ~  she saw DrKimbrough 7/11 for LBP, microhematuria, urge incontinence- stable, no additional Rx. ~  she saw [place] 1/12 for urge incontinence, freq, nocturia> rec Desmopressin 0.2mg - 1/2 tab Qhs.  CARCINOMA, BREAST, LEFT (ICD-174.9) - Surgery 9/94 by DrBlievernicht w/ Left modified radical mastectomy... +estrogen receptors and Rx w/ Tamoxifen... f/u mammograms all neg... breast exam on right has been neg-  no nodules palpated... She says that DrBertrand insisted that her PCP check her right breast at each & every office visit to be sure she doesn't develop any nodules...  DEGENERATIVE JOINT DISEASE (ICD-715.90) - on OTC meds + VICODIN Prn... she has end-stage OA of Right Knee per DrWhitfield treated w/ shots in the past... she has a knee brace and a cane for ambulation... ~  8/13:  She wants a better pain pill & we discussed change to [place] prn... ~  12/13: she tells me that DrWhitfield injected her knee last wk- improved...  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 Rx w/ rest, heat, Percocet, and f/u w/ Ortho...  Hx of LOW BACK PAIN, CHRONIC (ICD-724.2) - eval DrWhitfield w/ rec for shots by DrNewton & she's feeling sl better & wears back brace... ~  5/10: mult somatic complaints- primarily c/o left side/ postero-lat rib pain...  Exam w/ tender left lat/ post ribs... Bone Scan= neg... ~  MRI Lumbar  spine 7/10 by DrWhitfield... ~  Epidural steroid shot by DrNewton 8/10 & 1/11 ~  Repeat LBP eval by DrWhitfield w/ another MRI lumbar spine showing degenerative changes w/ mild progression since 7/10 MRI, mild-mod sp stenosis & lat recess stenosis at several levels ~  DrWhitfield referred her to [facility] to consider more shots but she is not so inclined... ~  6/13: she saw DrTooke w/ lumbar spondylosis; he felt that he had nothing to offer her & rec incr Tramadol to Qid... ~  2014:  She has had numerous visits w/ DrWhitfield/ [facility] for injections, XRays, scans etc...  CARPAL TUNNEL SYNDROME, BILATERAL (ICD-354.0) - she had prev right carpal tunnel release by DrWhitfield and was c/o increasing discomfort in her left wrist despite wrist splint Qhs- had left carpal tunnel release 1/10 by DrWhitfield...  ANXIETY (ICD-300.00) - on ALPRAZOLAM 0.5mg  Tid & encouraged to take it regularly... she has a cancer phobia & it is very difficult to reassure her regarding her symptoms and our evaluations... ~  7/13: she went to the ER c/o swelling of the tongue but eval by EDP indicated that her tongue was normal & not swollen, she was not on an ACE, given Pred Rx anyway. ~  She has been asked to incr the ALprazolam to regular dosing but she is reluctant...   Past Surgical History  Procedure Laterality Date  . Left mastectomy    . Carpal tunnel release      Outpatient Encounter Prescriptions as of 03/22/2015  Medication Sig  . ALPRAZolam (XANAX) 0.5 MG tablet TAKE 1/2 TO 1 TABLET THREE TIMES DAILY AS NEEDED  . amLODipine (NORVASC) 2.5 MG tablet Take 1 tablet (2.5 mg total) by mouth daily.  Marland Kitchen aspirin EC 81 MG tablet Take 81 mg by mouth daily.  . carvedilol (COREG) 3.125 MG tablet Take 1 tablet (3.125 mg total) by mouth 2 (two) times daily.  . Cholecalciferol (VITAMIN D3) 1000 UNITS CAPS Take 1 capsule by mouth daily. For your bones  . citalopram (CELEXA) 10 MG tablet Take 1 tablet (10 mg total) by mouth  daily.  . fluticasone (FLONASE) 50 MCG/ACT nasal spray Place 2 sprays into both nostrils daily.  . furosemide (LASIX) 40 MG tablet Take 1 tablet (40 mg total) by mouth daily. For swelling (Patient taking differently: Take 40 mg by mouth as needed. For swelling)  . HYDROcodone-acetaminophen (NORCO/VICODIN) 5-325 MG per tablet TAKE ONE TABLET EVERY 6 HOURS AS  NEEDED FOR SEVERE PAIN  . loratadine (CLARITIN) 10 MG tablet Take 10 mg by mouth daily.  . Multiple Vitamins-Minerals (MULTIVITAMINS THER. W/MINERALS) TABS Take 1 tablet by mouth daily.  . pantoprazole (PROTONIX) 40 MG tablet Take 1 tablet (40 mg total) by mouth 2 (two) times daily.  . polyethylene glycol powder (GLYCOLAX) powder Take 17 g by mouth 2 (two) times daily.  Marland Kitchen guaiFENesin (MUCINEX) 600 MG 12 hr tablet Take 1-2 tablet by mouth two times daily   No facility-administered encounter medications on file as of 03/22/2015.    Allergies  Allergen Reactions  . Fentanyl     REACTION: nausea    Current Medications, Allergies, Past Medical History, Past Surgical History, Family History, and Social History were reviewed in [employer] record.    Review of Systems      See HPI - all other systems neg except as noted... She has mult somatic complaints and anxiety. The patient complains of decreased hearing, chest discomfort, dyspnea on exertion, abdominal pain, incontinence, muscle weakness, and difficulty walking.  The patient denies anorexia, fever, vision loss, hoarseness, syncope, peripheral edema, prolonged cough, headaches, hemoptysis, melena, hematochezia, severe indigestion/heartburn, hematuria, transient blindness, depression, abnormal bleeding, enlarged lymph nodes, and angioedema.     Objective:   Physical Exam   WD, WN, 81 y/o BF in NAD... she is very anxious... GENERAL:  Alert & oriented; pleasant & cooperative... HEENT:  [place]eller/AT, EOM-wnl, PERRLA, EACs-clear, TMs-wnl, NOSE-clear, THROAT-clear & wnl, no  lesions seen. NECK:  Neck ROM decreased, no JVD; normal carotid impulses w/o bruits; no thyromegaly or nodules palpated; no lymphadenopathy. CHEST:  Clear to P & A; without wheezes/ rales/ or rhonchi heard... BREAST:  s/p left mastectomy... right breast normal- without nodules palpated... HEART:  Regular Rhythm; without murmurs/ rubs/ or gallops detected... ABDOMEN:  Soft & nontender; normal bowel sounds; no organomegaly or masses palpated... EXT: without deformities, mod arthritic changes; no varicose veins/ +venous insuffic/ tr edema. NEURO:  CN's intact; no focal neuro deficits... DERM:  Small sl excoriated area of dermatitis left shin- no drainage,no rash, etc...  RADIOLOGY DATA:  Reviewed in the EPIC EMR & discussed w/ the patient...  LABORATORY DATA:  Reviewed in the EPIC EMR & discussed w/ the patient...   Assessment & Plan:    RESPIRATORY SYMPTOMS w/ Dyspnea & throat clearing, prob LPR>  She had a neg pulm eval 2011 and numerous times thereafter; she insisted on additional eval 9/13 for her chr throat clearing symptom; CXR w/o acute changes & PFTs normal for her 81 y/o lungs; she is quite insistent on med rx for her problem but has not had relief from anything; REC to take [id number], Fluids, DELSYM prn; pt very anxious & Alprazolam seems to help when she takes it... 11/15> she has been taking the OTC MUCINEX 600mg  Qid w/ fluids and seems sl improved; rec strict antireflux regimen w/ Protonix before dinner, NPO after dinner, elev HOB 6" blocks... 5/16> she is once again off all these meds and c/o incr symptoms; rec to restart the Mucinex, fluids, and antireflux regimen...   Hx CP> evaluated for Cards by DrBensimon & he has not been in favor of invasive testing; stable on conservative med rx...  GI> HH, Gastritis, Divertics, vague abd pain>  I tried once again to reassure her about the thorough GI evaluations that her has undergone; she is happy w/ DrThorne's eval & will f/u w/ her  & in the meanwhile continue rx w/ Prilosec40, Miralax, Metamucil,  Senakot, Mylicon, etc... She has re-established w/  GI- their notes reviewed...  Hx left Breast Cancer>  No known recurrence...  Anxiety>  Asked once again to increase the Alprazolam to 1/2 - 1 tab TID regularly...   Patient's Medications  New Prescriptions   No medications on file  Previous Medications   ALPRAZOLAM (XANAX) 0.5 MG TABLET    TAKE 1/2 TO 1 TABLET THREE TIMES DAILY AS NEEDED   AMLODIPINE (NORVASC) 2.5 MG TABLET    Take 1 tablet (2.5 mg total) by mouth daily.   ASPIRIN EC 81 MG TABLET    Take 81 mg by mouth daily.   CARVEDILOL (COREG) 3.125 MG TABLET    Take 1 tablet (3.125 mg total) by mouth 2 (two) times daily.   CHOLECALCIFEROL (VITAMIN D3) 1000 UNITS CAPS    Take 1 capsule by mouth daily. For your bones   CITALOPRAM (CELEXA) 10 MG TABLET    Take 1 tablet (10 mg total) by mouth daily.   FLUTICASONE (FLONASE) 50 MCG/ACT NASAL SPRAY    Place 2 sprays into both nostrils daily.   FUROSEMIDE (LASIX) 40 MG TABLET    Take 1 tablet (40 mg total) by mouth daily. For swelling   GUAIFENESIN (MUCINEX) 600 MG 12 HR TABLET    Take 1-2 tablet by mouth two times daily   HYDROCODONE-ACETAMINOPHEN (NORCO/VICODIN) 5-325 MG PER TABLET    TAKE ONE TABLET EVERY 6 HOURS AS NEEDED FOR SEVERE PAIN   LORATADINE (CLARITIN) 10 MG TABLET    Take 10 mg by mouth daily.   MULTIPLE VITAMINS-MINERALS (MULTIVITAMINS THER. W/MINERALS) TABS    Take 1 tablet by mouth daily.   PANTOPRAZOLE (PROTONIX) 40 MG TABLET    Take 1 tablet (40 mg total) by mouth 2 (two) times daily.   POLYETHYLENE GLYCOL POWDER (GLYCOLAX) POWDER    Take 17 g by mouth 2 (two) times daily.  Modified Medications   No medications on file  Discontinued Medications   No medications on file

## 2015-03-22 NOTE — Patient Instructions (Signed)
Today we updated your med list in our EPIC system...    Continue your current medications the same...  Today we did a follow up CXR...    We will contact you w/ the results when available...   I would recommend restarting the [facility] 600mg  tabs- one tab by mouth 4 times daily w/ extra fluids...   You should increase your nutritional supplements like BOOST to twice daily...   Call for any questions.Marland KitchenMarland Kitchen

## 2015-03-23 ENCOUNTER — Ambulatory Visit
Admission: RE | Admit: 2015-03-23 | Discharge: 2015-03-23 | Disposition: A | Discharge status: Home / Self Care | Payer: Medicare Other | Source: Ambulatory Visit | Attending: Physician Assistant | Admitting: Physician Assistant

## 2015-03-23 DIAGNOSIS — R1310 Dysphagia, unspecified: Secondary | ICD-10-CM

## 2015-03-26 ENCOUNTER — Telehealth: Payer: Self-pay | Admitting: Pulmonary Disease

## 2015-03-26 ENCOUNTER — Other Ambulatory Visit: Payer: Medicare Other

## 2015-03-26 NOTE — Telephone Encounter (Signed)
PT informed of cxr results per Dr Lenna Gilford

## 2015-04-05 ENCOUNTER — Ambulatory Visit: Payer: Medicare Other | Admitting: Nurse Practitioner

## 2015-04-27 ENCOUNTER — Encounter: Payer: Self-pay | Admitting: Gastroenterology

## 2015-04-30 ENCOUNTER — Encounter (HOSPITAL_COMMUNITY): Payer: Self-pay | Admitting: Emergency Medicine

## 2015-04-30 ENCOUNTER — Emergency Department (HOSPITAL_COMMUNITY)
Admission: EM | Admit: 2015-04-30 | Discharge: 2015-04-30 | Disposition: A | Discharge status: Home / Self Care | Payer: Medicare Other | Attending: Emergency Medicine | Admitting: Emergency Medicine

## 2015-04-30 DIAGNOSIS — Z853 Personal history of malignant neoplasm of breast: Secondary | ICD-10-CM | POA: Insufficient documentation

## 2015-04-30 DIAGNOSIS — Z79899 Other long term (current) drug therapy: Secondary | ICD-10-CM | POA: Diagnosis not present

## 2015-04-30 DIAGNOSIS — I10 Essential (primary) hypertension: Secondary | ICD-10-CM | POA: Diagnosis not present

## 2015-04-30 DIAGNOSIS — Z8669 Personal history of other diseases of the nervous system and sense organs: Secondary | ICD-10-CM | POA: Diagnosis not present

## 2015-04-30 DIAGNOSIS — Z86010 Personal history of colonic polyps: Secondary | ICD-10-CM | POA: Insufficient documentation

## 2015-04-30 DIAGNOSIS — J411 Mucopurulent chronic bronchitis: Secondary | ICD-10-CM | POA: Diagnosis not present

## 2015-04-30 DIAGNOSIS — Z7982 Long term (current) use of aspirin: Secondary | ICD-10-CM | POA: Insufficient documentation

## 2015-04-30 DIAGNOSIS — F419 Anxiety disorder, unspecified: Secondary | ICD-10-CM | POA: Insufficient documentation

## 2015-04-30 DIAGNOSIS — Z7951 Long term (current) use of inhaled steroids: Secondary | ICD-10-CM | POA: Diagnosis not present

## 2015-04-30 DIAGNOSIS — R05 Cough: Secondary | ICD-10-CM | POA: Diagnosis present

## 2015-04-30 DIAGNOSIS — M1990 Unspecified osteoarthritis, unspecified site: Secondary | ICD-10-CM | POA: Diagnosis not present

## 2015-04-30 DIAGNOSIS — K219 Gastro-esophageal reflux disease without esophagitis: Secondary | ICD-10-CM | POA: Diagnosis not present

## 2015-04-30 MED ORDER — RANITIDINE HCL 150 MG PO TABS: 150.0000 mg | ORAL_TABLET | Freq: Two times a day (BID) | ORAL | Status: DC

## 2015-04-30 MED ORDER — SUCRALFATE 1 GM/10ML PO SUSP: 1.0000 g | Freq: Three times a day (TID) | ORAL | Status: DC

## 2015-04-30 NOTE — Discharge Instructions (Signed)
Gastroesophageal Reflux Disease, Adult °Gastroesophageal reflux disease (GERD) happens when acid from your stomach goes into your food pipe (esophagus). The acid can cause a burning feeling in your chest. Over time, the acid can make small holes (ulcers) in your food pipe.  °HOME CARE °· Ask your doctor for advice about: °¨ Losing weight. °¨ Quitting smoking. °¨ Alcohol use. °· Avoid foods and drinks that make your problems worse. You may want to avoid: °¨ Caffeine and alcohol. °¨ Chocolate. °¨ Mints. °¨ Garlic and onions. °¨ Spicy foods. °¨ Citrus fruits, such as oranges, lemons, or limes. °¨ Foods that contain tomato, such as sauce, chili, salsa, and pizza. °¨ Fried and fatty foods. °· Avoid lying down for 3 hours before you go to bed or before you take a nap. °· Eat small meals often, instead of large meals. °· Wear loose-fitting clothing. Do not wear anything tight around your waist. °· Raise (elevate) the head of your bed 6 to 8 inches with wood blocks. Using extra pillows does not help. °· Only take medicines as told by your doctor. °· Do not take aspirin or ibuprofen. °GET HELP RIGHT AWAY IF:  °· You have pain in your arms, neck, jaw, teeth, or back. °· Your pain gets worse or changes. °· You feel sick to your stomach (nauseous), throw up (vomit), or sweat (diaphoresis). °· You feel short of breath, or you pass out (faint). °· Your throw up is green, yellow, black, or looks like coffee grounds or blood. °· Your poop (stool) is red, bloody, or black. °MAKE SURE YOU:  °· Understand these instructions. °· Will watch your condition. °· Will get help right away if you are not doing well or get worse. °Document Released: 04/21/2008 Document Revised: 01/26/2012 Document Reviewed: 05/23/2011 °[employer]® Patient Information ©2015 [employer]. This information is not intended to replace advice given to you by your health care provider. Make sure you discuss any questions you have with your health care provider. ° °

## 2015-04-30 NOTE — ED Provider Notes (Signed)
CSN: [id number]     Arrival date & time 04/30/15  1001 History   First MD Initiated Contact with Patient 04/30/15 1008     Chief Complaint  Patient presents with  . Cough  . Nasal Congestion     (Consider location/radiation/quality/duration/timing/severity/associated sxs/prior Treatment) HPI Comments: Patient presents to the ER for multiple problems. Patient stated primarily that she was being seen for congestion in her chest and throat that occurs at night. She reports that she has to bring up the phlegm and it is getting thicker over the last 3 or 4 months. She does not cough, however. There is no shortness of breath.  Patient is a 81 y.o. female presenting with cough.  Cough   Past Medical History  Diagnosis Date  . RBBB (right bundle branch block with left anterior fascicular block)   . Hypertension   . Hiatal hernia   . Gastritis, chronic   . Breast cancer     NO STICK OR BLOOD PRESSURE CHECKS IN LEFT ARM  . Degenerative joint disease   . Anxiety state, unspecified   . Low back pain   . Carpal tunnel syndrome   . Renal cyst   . Diverticula of colon   . Cervical spondylosis   . Diastolic dysfunction   . Venous insufficiency   . Constipation   . Colon polyp 12/19/2009    Transverse-polypoid colorectal mucosa  . Allergic rhinitis, cause unspecified 02/21/2014   Past Surgical History  Procedure Laterality Date  . Left mastectomy    . Carpal tunnel release     Family History  Problem Relation Age of Onset  . Colon cancer Neg Hx    History  Substance Use Topics  . Smoking status: Never Smoker   . Smokeless tobacco: Never Used  . Alcohol Use: No   OB History    No data available     Review of Systems  HENT: Positive for congestion.   All other systems reviewed and are negative.     Allergies  Fentanyl  Home Medications   Prior to Admission medications   Medication Sig Start Date End Date Taking? Authorizing Provider  ALPRAZolam (XANAX) 0.5 MG tablet  TAKE 1/2 TO 1 TABLET THREE TIMES DAILY AS NEEDED Patient taking differently: TAKE 0.25MG  TO 0.5MG  DAILY AS NEEDED FOR ANXIETY 10/03/14  Yes Biagio Borg, MD  amLODipine (NORVASC) 2.5 MG tablet Take 1 tablet (2.5 mg total) by mouth daily. 02/02/14  Yes Biagio Borg, MD  aspirin EC 81 MG tablet Take 81 mg by mouth daily.   Yes Historical Provider, MD  carvedilol (COREG) 3.125 MG tablet Take 1 tablet (3.125 mg total) by mouth 2 (two) times daily. 02/02/14  Yes Biagio Borg, MD  Cholecalciferol (VITAMIN D3) 1000 UNITS CAPS Take 1,000 Units by mouth daily. For your bones   Yes Historical Provider, MD  furosemide (LASIX) 40 MG tablet Take 1 tablet (40 mg total) by mouth daily. For swelling Patient taking differently: Take 40 mg by mouth as needed. For swelling 02/02/14  Yes Biagio Borg, MD  Multiple Vitamins-Minerals (MULTIVITAMINS THER. W/MINERALS) TABS Take 1 tablet by mouth daily.   Yes Historical Provider, MD  pantoprazole (PROTONIX) 40 MG tablet Take 1 tablet (40 mg total) by mouth 2 (two) times daily. 09/21/14  Yes Biagio Borg, MD  sulindac (CLINORIL) 200 MG tablet Take 200 mg by mouth daily as needed.   Yes Historical Provider, MD  citalopram (CELEXA) 10 MG tablet Take 1  tablet (10 mg total) by mouth daily. Patient not taking: Reported on 04/30/2015 08/03/14   Biagio Borg, MD  fluticasone [facility]) 50 MCG/ACT nasal spray Place 2 sprays into both nostrils daily. 10/20/14   Biagio Borg, MD  guaiFENesin (MUCINEX) 600 MG 12 hr tablet Take 1-2 tablet by mouth two times daily    Historical Provider, MD  HYDROcodone-acetaminophen (NORCO/VICODIN) 5-325 MG per tablet TAKE ONE TABLET EVERY 6 HOURS AS NEEDED FOR SEVERE PAIN 11/21/14   Biagio Borg, MD  loratadine (CLARITIN) 10 MG tablet Take 10 mg by mouth daily.    Historical Provider, MD  polyethylene glycol powder (GLYCOLAX) powder Take 17 g by mouth 2 (two) times daily. Patient not taking: Reported on 04/30/2015 06/22/13   Willia Craze, NP   BP 110/59 mmHg   Pulse 77  Temp(Src) 98.2 F (36.8 C) (Oral)  Resp 20  SpO2 98% Physical Exam  Constitutional: She is oriented to person, place, and time. She appears well-developed and well-nourished. No distress.  HENT:  Head: Normocephalic and atraumatic.  Right Ear: Hearing normal.  Left Ear: Hearing normal.  Nose: Nose normal.  Mouth/Throat: Oropharynx is clear and moist and mucous membranes are normal.  Eyes: Conjunctivae and EOM are normal. Pupils are equal, round, and reactive to light.  Neck: Normal range of motion. Neck supple.  Cardiovascular: Regular rhythm, S1 normal and S2 normal.  Exam reveals no gallop and no friction rub.   No murmur heard. Pulmonary/Chest: Effort normal and breath sounds normal. No respiratory distress. She exhibits no tenderness.  Abdominal: Soft. Normal appearance and bowel sounds are normal. There is no hepatosplenomegaly. There is no tenderness. There is no rebound, no guarding, no tenderness at McBurney's point and negative Murphy's sign. No hernia.  Musculoskeletal: Normal range of motion. She exhibits edema.  Neurological: She is alert and oriented to person, place, and time. She has normal strength. No cranial nerve deficit or sensory deficit. Coordination normal. GCS eye subscore is 4. GCS verbal subscore is 5. GCS motor subscore is 6.  Skin: Skin is warm, dry and intact. No rash noted. No cyanosis.  Psychiatric: She has a normal mood and affect. Her speech is normal and behavior is normal. Thought content normal.  Nursing note and vitals reviewed.   ED Course  Procedures (including critical care time) Labs Review Labs Reviewed - No data to display  Imaging Review No results found.   EKG Interpretation None      MDM   Final diagnoses:  None   chronic mucopurulent bronchitis  Presents to the ER stating that she has chest congestion and brings up thick phlegm at night. I reviewed her records. She just saw her pulmonologist, Dr. Lenna Gilford last month  for this problem. She was told that it is secondary to chronic mucopurulent bronchitis and her acid reflux. She was told to adhere to her strict acid reflux regimen. Patient tells me that she was unaware that she has has reflux. Reviewing her records reveal she also has a hiatal hernia. I reinforced this with the patient. At this time she then started to complain about multiple other issues. She was complaining about pain in the left side of her abdomen. This has been well documented to be present since before 2009 and has had multiple workups. Her abdomen is benign. She also complains of leg swelling. She has a history of chronic venous stasis according to her records, this appears to be chronic as well. Patient somewhat difficult to  educate on her problems, but I did try to reinforce her chronic condition diagnoses with her. Will add ranitidine and Carafate, as she does carry a diagnosis of gastritis as well as acid reflux. She is told to follow-up with her GI doctor who appears to be Dr. Oletta Lamas currently.   Orpah Greek, MD 04/30/15 1034

## 2015-04-30 NOTE — ED Notes (Signed)
Pt c/o cough and congestion started over a year ago. states Dr told her to take mucinex and do nasal spray and it has gotten worse. Pt states nothing is working.

## 2015-05-17 ENCOUNTER — Emergency Department (HOSPITAL_COMMUNITY): Payer: Medicare Other

## 2015-05-17 ENCOUNTER — Encounter (HOSPITAL_COMMUNITY): Payer: Self-pay | Admitting: Emergency Medicine

## 2015-05-17 ENCOUNTER — Emergency Department (HOSPITAL_COMMUNITY)
Admission: EM | Admit: 2015-05-17 | Discharge: 2015-05-17 | Disposition: A | Discharge status: Home / Self Care | Payer: Medicare Other | Attending: Emergency Medicine | Admitting: Emergency Medicine

## 2015-05-17 DIAGNOSIS — Z7982 Long term (current) use of aspirin: Secondary | ICD-10-CM | POA: Insufficient documentation

## 2015-05-17 DIAGNOSIS — Q6100 Congenital renal cyst, unspecified: Secondary | ICD-10-CM | POA: Diagnosis not present

## 2015-05-17 DIAGNOSIS — R1012 Left upper quadrant pain: Secondary | ICD-10-CM | POA: Insufficient documentation

## 2015-05-17 DIAGNOSIS — Z8669 Personal history of other diseases of the nervous system and sense organs: Secondary | ICD-10-CM | POA: Diagnosis not present

## 2015-05-17 DIAGNOSIS — Z7951 Long term (current) use of inhaled steroids: Secondary | ICD-10-CM | POA: Diagnosis not present

## 2015-05-17 DIAGNOSIS — Z8739 Personal history of other diseases of the musculoskeletal system and connective tissue: Secondary | ICD-10-CM | POA: Insufficient documentation

## 2015-05-17 DIAGNOSIS — Z79899 Other long term (current) drug therapy: Secondary | ICD-10-CM | POA: Insufficient documentation

## 2015-05-17 DIAGNOSIS — R1032 Left lower quadrant pain: Secondary | ICD-10-CM | POA: Insufficient documentation

## 2015-05-17 DIAGNOSIS — R109 Unspecified abdominal pain: Secondary | ICD-10-CM

## 2015-05-17 DIAGNOSIS — K5900 Constipation, unspecified: Secondary | ICD-10-CM | POA: Diagnosis not present

## 2015-05-17 DIAGNOSIS — Z853 Personal history of malignant neoplasm of breast: Secondary | ICD-10-CM | POA: Insufficient documentation

## 2015-05-17 DIAGNOSIS — F419 Anxiety disorder, unspecified: Secondary | ICD-10-CM | POA: Insufficient documentation

## 2015-05-17 DIAGNOSIS — Z86010 Personal history of colonic polyps: Secondary | ICD-10-CM | POA: Insufficient documentation

## 2015-05-17 DIAGNOSIS — I10 Essential (primary) hypertension: Secondary | ICD-10-CM | POA: Diagnosis not present

## 2015-05-17 LAB — LIPASE, BLOOD: Lipase: 27 U/L (ref 22–51)

## 2015-05-17 LAB — CBC WITH DIFFERENTIAL/PLATELET
Basophils Absolute: 0 10*3/uL (ref 0.0–0.1)
Basophils Relative: 0 % (ref 0–1)
Eosinophils Absolute: 0.1 10*3/uL (ref 0.0–0.7)
Eosinophils Relative: 2 % (ref 0–5)
HCT: 40.6 % (ref 36.0–46.0)
HEMOGLOBIN: 13.2 g/dL (ref 12.0–15.0)
LYMPHS PCT: 42 % (ref 12–46)
Lymphs Abs: 2.1 10*3/uL (ref 0.7–4.0)
MCH: 31.2 pg (ref 26.0–34.0)
MCHC: 32.5 g/dL (ref 30.0–36.0)
MCV: 96 fL (ref 78.0–100.0)
Monocytes Absolute: 0.6 10*3/uL (ref 0.1–1.0)
Monocytes Relative: 12 % (ref 3–12)
NEUTROS PCT: 44 % (ref 43–77)
Neutro Abs: 2.2 10*3/uL (ref 1.7–7.7)
Platelets: 212 10*3/uL (ref 150–400)
RBC: 4.23 MIL/uL (ref 3.87–5.11)
RDW: 13.5 % (ref 11.5–15.5)
WBC: 4.9 10*3/uL (ref 4.0–10.5)

## 2015-05-17 LAB — COMPREHENSIVE METABOLIC PANEL
ALK PHOS: 37 U/L — AB (ref 38–126)
ALT: 22 U/L (ref 14–54)
AST: 27 U/L (ref 15–41)
Albumin: 3.9 g/dL (ref 3.5–5.0)
Anion gap: 8 (ref 5–15)
BILIRUBIN TOTAL: 0.9 mg/dL (ref 0.3–1.2)
BUN: 22 mg/dL — ABNORMAL HIGH (ref 6–20)
CALCIUM: 9.3 mg/dL (ref 8.9–10.3)
CHLORIDE: 102 mmol/L (ref 101–111)
CO2: 30 mmol/L (ref 22–32)
Creatinine, Ser: 0.9 mg/dL (ref 0.44–1.00)
GFR, EST NON AFRICAN AMERICAN: 53 mL/min — AB (ref >60)
Glucose, Bld: 100 mg/dL — ABNORMAL HIGH (ref 65–99)
POTASSIUM: 3.6 mmol/L (ref 3.5–5.1)
SODIUM: 140 mmol/L (ref 135–145)
Total Protein: 6.6 g/dL (ref 6.5–8.1)

## 2015-05-17 LAB — URINE MICROSCOPIC-ADD ON

## 2015-05-17 LAB — URINALYSIS, ROUTINE W REFLEX MICROSCOPIC
Bilirubin Urine: NEGATIVE
Glucose, UA: NEGATIVE mg/dL
KETONES UR: NEGATIVE mg/dL
Nitrite: NEGATIVE
Protein, ur: NEGATIVE mg/dL
Specific Gravity, Urine: 1.013 (ref 1.005–1.030)
Urobilinogen, UA: 0.2 mg/dL (ref 0.0–1.0)
pH: 5.5 (ref 5.0–8.0)

## 2015-05-17 MED ORDER — RANITIDINE HCL 75 MG PO TABS: 75.0000 mg | ORAL_TABLET | Freq: Two times a day (BID) | ORAL | Status: DC

## 2015-05-17 MED ORDER — ACETAMINOPHEN 325 MG PO TABS
650.0000 mg | ORAL_TABLET | Freq: Once | ORAL | Status: AC
Start: 2015-05-17 — End: 2015-05-17
  Administered 2015-05-17: 650 mg via ORAL
  Filled 2015-05-17: qty 2

## 2015-05-17 MED ORDER — IOHEXOL 300 MG/ML  SOLN
50.0000 mL | Freq: Once | INTRAMUSCULAR | Status: AC | PRN
Start: 2015-05-17 — End: 2015-05-17
  Administered 2015-05-17: 25 mL via ORAL

## 2015-05-17 MED ORDER — IOHEXOL 300 MG/ML  SOLN
100.0000 mL | Freq: Once | INTRAMUSCULAR | Status: AC | PRN
  Administered 2015-05-17: 100 mL via INTRAVENOUS

## 2015-05-17 NOTE — ED Notes (Signed)
Pt at CT

## 2015-05-17 NOTE — ED Provider Notes (Signed)
CSN: [id number]     Arrival date & time 05/17/15  1321 History   First MD Initiated Contact with Patient 05/17/15 1501     Chief Complaint  Patient presents with  . Abdominal Pain     (Consider location/radiation/quality/duration/timing/severity/associated sxs/prior Treatment) Patient is a 81 y.o. female presenting with abdominal pain.  Abdominal Pain Pain location:  LUQ and LLQ Pain quality: aching   Pain radiates to:  Does not radiate Pain severity:  Severe Onset quality:  Gradual Duration: several months. Timing:  Constant Progression:  Worsening Chronicity:  Chronic Context: laxative use   Relieved by:  Nothing Worsened by:  Nothing tried Associated symptoms: constipation (although reports large bowel movement today.)   Associated symptoms: no diarrhea, no fever, no nausea, no shortness of breath and no vomiting   Associated symptoms comment:  Malodorous flatus   Past Medical History  Diagnosis Date  . RBBB (right bundle branch block with left anterior fascicular block)   . Hypertension   . Hiatal hernia   . Gastritis, chronic   . Breast cancer     NO STICK OR BLOOD PRESSURE CHECKS IN LEFT ARM  . Degenerative joint disease   . Anxiety state, unspecified   . Low back pain   . Carpal tunnel syndrome   . Renal cyst   . Diverticula of colon   . Cervical spondylosis   . Diastolic dysfunction   . Venous insufficiency   . Constipation   . Colon polyp 12/19/2009    Transverse-polypoid colorectal mucosa  . Allergic rhinitis, cause unspecified 02/21/2014   Past Surgical History  Procedure Laterality Date  . Left mastectomy    . Carpal tunnel release     Family History  Problem Relation Age of Onset  . Colon cancer Neg Hx    History  Substance Use Topics  . Smoking status: Never Smoker   . Smokeless tobacco: Never Used  . Alcohol Use: No   OB History    No data available     Review of Systems  Constitutional: Negative for fever.  Respiratory: Negative  for shortness of breath.   Gastrointestinal: Positive for abdominal pain and constipation (although reports large bowel movement today.). Negative for nausea, vomiting and diarrhea.  All other systems reviewed and are negative.     Allergies  Fentanyl  Home Medications   Prior to Admission medications   Medication Sig Start Date End Date Taking? Authorizing Provider  ALPRAZolam (XANAX) 0.5 MG tablet TAKE 1/2 TO 1 TABLET THREE TIMES DAILY AS NEEDED Patient taking differently: TAKE 0.25MG  TO 0.5MG  DAILY AS NEEDED FOR ANXIETY 10/03/14  Yes Biagio Borg, MD  aspirin EC 81 MG tablet Take 81 mg by mouth daily.   Yes Historical Provider, MD  bisacodyl (DULCOLAX) 5 MG EC tablet Take 5 mg by mouth daily as needed for mild constipation or moderate constipation.   Yes Historical Provider, MD  carvedilol (COREG) 3.125 MG tablet Take 1 tablet (3.125 mg total) by mouth 2 (two) times daily. 02/02/14  Yes Biagio Borg, MD  Cholecalciferol (VITAMIN D3) 1000 UNITS CAPS Take 1,000 Units by mouth daily. For your bones   Yes Historical Provider, MD  fluticasone (FLONASE) 50 MCG/ACT nasal spray Place 2 sprays into both nostrils daily. 10/20/14  Yes Biagio Borg, MD  furosemide (LASIX) 40 MG tablet Take 1 tablet (40 mg total) by mouth daily. For swelling 02/02/14  Yes Biagio Borg, MD  Multiple Vitamins-Minerals (MULTIVITAMINS THER. W/MINERALS) TABS Take 1  tablet by mouth daily.   Yes Historical Provider, MD  pantoprazole (PROTONIX) 40 MG tablet Take 1 tablet (40 mg total) by mouth 2 (two) times daily. 09/21/14  Yes Biagio Borg, MD  polyethylene glycol [facility]) packet Take 17 g by mouth 2 (two) times daily as needed for mild constipation.   Yes Historical Provider, MD  simethicone (MYLICON) 80 MG chewable tablet Chew 80 mg by mouth every 6 (six) hours as needed for flatulence.   Yes Historical Provider, MD  sodium chloride (OCEAN) 0.65 % SOLN nasal spray Place 1-2 sprays into both nostrils daily as  needed for congestion.   Yes Historical Provider, MD  amLODipine (NORVASC) 2.5 MG tablet Take 1 tablet (2.5 mg total) by mouth daily. Patient not taking: Reported on 05/17/2015 02/02/14   Biagio Borg, MD  citalopram (CELEXA) 10 MG tablet Take 1 tablet (10 mg total) by mouth daily. Patient not taking: Reported on 05/17/2015 08/03/14   Biagio Borg, MD  HYDROcodone-acetaminophen (NORCO/VICODIN) 5-325 MG per tablet TAKE ONE TABLET EVERY 6 HOURS AS NEEDED FOR SEVERE PAIN Patient not taking: Reported on 04/30/2015 11/21/14   Biagio Borg, MD  polyethylene glycol powder (GLYCOLAX) powder Take 17 g by mouth 2 (two) times daily. Patient not taking: Reported on 04/30/2015 06/22/13   Willia Craze, NP  ranitidine (ZANTAC) 150 MG tablet Take 1 tablet (150 mg total) by mouth 2 (two) times daily. Patient not taking: Reported on 05/17/2015 04/30/15   Orpah Greek, MD  sucralfate (CARAFATE) 1 GM/10ML suspension Take 10 mLs (1 g total) by mouth 4 (four) times daily -  with meals and at bedtime. Patient not taking: Reported on 05/17/2015 04/30/15   Orpah Greek, MD   BP 118/63 mmHg  Pulse 64  Temp(Src) 98.2 F (36.8 C) (Oral)  Resp 18  SpO2 100% Physical Exam  Constitutional: She is oriented to person, place, and time. She appears well-developed and well-nourished. No distress.  HENT:  Head: Normocephalic and atraumatic.  Eyes: Conjunctivae are normal. No scleral icterus.  Neck: Neck supple.  Cardiovascular: Normal rate and intact distal pulses.   Pulmonary/Chest: Effort normal. No stridor. No respiratory distress.  Abdominal: Normal appearance. She exhibits no distension. There is tenderness in the left upper quadrant and left lower quadrant. There is no rigidity, no rebound and no guarding.  Neurological: She is alert and oriented to person, place, and time.  Skin: Skin is warm and dry. No rash noted.  Psychiatric: She has a normal mood and affect. Her behavior is normal.  Nursing note and  vitals reviewed.   ED Course  Procedures (including critical care time) Labs Review Labs Reviewed  COMPREHENSIVE METABOLIC PANEL - Abnormal; Notable for the following:    Glucose, Bld 100 (*)    BUN 22 (*)    Alkaline Phosphatase 37 (*)    GFR calc non Af Amer 53 (*)    All other components within normal limits  URINALYSIS, ROUTINE W REFLEX MICROSCOPIC (NOT AT [facility]) - Abnormal; Notable for the following:    Hgb urine dipstick SMALL (*)    Leukocytes, UA SMALL (*)    All other components within normal limits  CBC WITH DIFFERENTIAL/PLATELET  LIPASE, BLOOD  URINE MICROSCOPIC-ADD ON    Imaging Review Ct Abdomen Pelvis W Contrast  05/17/2015   CLINICAL DATA:  Left-sided abdominal pain with recent weight loss  EXAM: CT ABDOMEN AND PELVIS WITH CONTRAST  TECHNIQUE: Multidetector CT imaging of the abdomen  and pelvis was performed using the standard protocol following bolus administration of intravenous contrast.  CONTRAST:  56mL OMNIPAQUE IOHEXOL 300 MG/ML SOLN, 111mL OMNIPAQUE IOHEXOL 300 MG/ML SOLN  COMPARISON:  11/25/2013  FINDINGS: Lung bases are free of acute infiltrate or sizable effusion. Few tiny 3-4 mm nodules are noted within the lateral aspect of the right middle lobe best seen on image number 8 of series for. These were not imaged on the prior exam and are new from a prior exam dated 02/12/2013. The liver shows no focal mass lesion. The spleen, gallbladder, adrenal glands and pancreas are within normal limits. Kidneys demonstrate evidence of bilateral extrarenal pelves without true obstructive change. Right renal cyst is noted. Delayed images demonstrate normal excretion of contrast.  Aortoiliac calcifications are seen without aneurysmal dilatation. The appendix is not well visualized although no inflammatory changes to suggest appendicitis are noted. The bladder is well distended. No pelvic mass lesion is seen. Diffuse degenerative changes of the lumbar spine are noted.  IMPRESSION: A few  tiny 3-4 mm nodules in the lateral aspect of the right middle lobe as described. If the patient is at high risk for bronchogenic carcinoma, follow-up chest CT at 1 year is recommended. If the patient is at low risk, no follow-up is needed. This recommendation follows the consensus statement: Guidelines for Management of Small Pulmonary Nodules Detected on CT Scans: A Statement from the [employer] as published in Radiology 2005; 237:395-400.  Aortic atherosclerotic change.  Right renal cystic change.   Electronically Signed   By: Inez Catalina M.D.   On: 05/17/2015 18:46     EKG Interpretation None      MDM   Final diagnoses:  Abdominal pain    81 yo female with left sided abdominal pain.  It is difficult to obtain a complete and consistent history from her.  She does have significant tenderness to palpation of her left hemiabdomen, but is otherwise well appearing.    CT scan reassuring.  I discussed with her about her pulmonary nodules and advised PCP follow up.  Serita Grit, MD 05/17/15 2126

## 2015-05-17 NOTE — ED Notes (Addendum)
Pt able to ambulate to the restroom with her cane without issue.

## 2015-05-17 NOTE — Discharge Instructions (Signed)
Abdominal Pain, Women °Abdominal (stomach, pelvic, or belly) pain can be caused by many things. It is important to tell your doctor: °· The location of the pain. °· Does it come and go or is it present all the time? °· Are there things that start the pain (eating certain foods, exercise)? °· Are there other symptoms associated with the pain (fever, nausea, vomiting, diarrhea)? °All of this is helpful to know when trying to find the cause of the pain. °CAUSES  °· Stomach: virus or bacteria infection, or ulcer. °· Intestine: appendicitis (inflamed appendix), regional ileitis (Crohn's disease), ulcerative colitis (inflamed colon), irritable bowel syndrome, diverticulitis (inflamed diverticulum of the colon), or cancer of the stomach or intestine. °· Gallbladder disease or stones in the gallbladder. °· Kidney disease, kidney stones, or infection. °· Pancreas infection or cancer. °· Fibromyalgia (pain disorder). °· Diseases of the female organs: °¨ Uterus: fibroid (non-cancerous) tumors or infection. °¨ Fallopian tubes: infection or tubal pregnancy. °¨ Ovary: cysts or tumors. °¨ Pelvic adhesions (scar tissue). °¨ Endometriosis (uterus lining tissue growing in the pelvis and on the pelvic organs). °¨ Pelvic congestion syndrome (female organs filling up with blood just before the menstrual period). °¨ Pain with the menstrual period. °¨ Pain with ovulation (producing an egg). °¨ Pain with an IUD (intrauterine device, birth control) in the uterus. °¨ Cancer of the female organs. °· Functional pain (pain not caused by a disease, may improve without treatment). °· Psychological pain. °· Depression. °DIAGNOSIS  °Your doctor will decide the seriousness of your pain by doing an examination. °· Blood tests. °· X-rays. °· Ultrasound. °· CT scan (computed tomography, special type of X-ray). °· MRI (magnetic resonance imaging). °· Cultures, for infection. °· Barium enema (dye inserted in the large intestine, to better view it with  X-rays). °· Colonoscopy (looking in intestine with a lighted tube). °· Laparoscopy (minor surgery, looking in abdomen with a lighted tube). °· Major abdominal exploratory surgery (looking in abdomen with a large incision). °TREATMENT  °The treatment will depend on the cause of the pain.  °· Many cases can be observed and treated at home. °· Over-the-counter medicines recommended by your caregiver. °· Prescription medicine. °· Antibiotics, for infection. °· Birth control pills, for painful periods or for ovulation pain. °· Hormone treatment, for endometriosis. °· Nerve blocking injections. °· Physical therapy. °· Antidepressants. °· Counseling with a psychologist or psychiatrist. °· Minor or major surgery. °HOME CARE INSTRUCTIONS  °· Do not take laxatives, unless directed by your caregiver. °· Take over-the-counter pain medicine only if ordered by your caregiver. Do not take aspirin because it can cause an upset stomach or bleeding. °· Try a clear liquid diet (broth or water) as ordered by your caregiver. Slowly move to a bland diet, as tolerated, if the pain is related to the stomach or intestine. °· Have a thermometer and take your temperature several times a day, and record it. °· Bed rest and sleep, if it helps the pain. °· Avoid sexual intercourse, if it causes pain. °· Avoid stressful situations. °· Keep your follow-up appointments and tests, as your caregiver orders. °· If the pain does not go away with medicine or surgery, you may try: °¨ Acupuncture. °¨ Relaxation exercises (yoga, meditation). °¨ Group therapy. °¨ Counseling. °SEEK MEDICAL CARE IF:  °· You notice certain foods cause stomach pain. °· Your home care treatment is not helping your pain. °· You need stronger pain medicine. °· You want your IUD removed. °· You feel faint or   lightheaded. °· You develop nausea and vomiting. °· You develop a rash. °· You are having side effects or an allergy to your medicine. °SEEK IMMEDIATE MEDICAL CARE IF:  °· Your  pain does not go away or gets worse. °· You have a fever. °· Your pain is felt only in portions of the abdomen. The right side could possibly be appendicitis. The left lower portion of the abdomen could be colitis or diverticulitis. °· You are passing blood in your stools (bright red or black tarry stools, with or without vomiting). °· You have blood in your urine. °· You develop chills, with or without a fever. °· You pass out. °MAKE SURE YOU:  °· Understand these instructions. °· Will watch your condition. °· Will get help right away if you are not doing well or get worse. °Document Released: 08/31/2007 Document Revised: 03/20/2014 Document Reviewed: 09/20/2009 °[employer]® Patient Information ©2015 [employer]. This information is not intended to replace advice given to you by your health care provider. Make sure you discuss any questions you have with your health care provider. ° °

## 2015-05-17 NOTE — ED Notes (Signed)
Pt c/o diffuse left sided abdominal pain radiating to left flank and left lower abdomen and swelling x 1 year, pt states she has lost about 60 pounds this year, pain worsens on standing and ambulation, lessens when sitting still. Pt denies anorexia, emesis, diarrhea.

## 2015-07-16 ENCOUNTER — Encounter: Payer: Self-pay | Admitting: Pulmonary Disease

## 2015-07-16 ENCOUNTER — Ambulatory Visit (INDEPENDENT_AMBULATORY_CARE_PROVIDER_SITE_OTHER): Payer: Medicare Other | Admitting: Pulmonary Disease

## 2015-07-16 DIAGNOSIS — J387 Other diseases of larynx: Secondary | ICD-10-CM

## 2015-07-16 DIAGNOSIS — K449 Diaphragmatic hernia without obstruction or gangrene: Secondary | ICD-10-CM | POA: Diagnosis not present

## 2015-07-16 DIAGNOSIS — R05 Cough: Secondary | ICD-10-CM

## 2015-07-16 DIAGNOSIS — K224 Dyskinesia of esophagus: Secondary | ICD-10-CM

## 2015-07-16 DIAGNOSIS — R059 Cough, unspecified: Secondary | ICD-10-CM

## 2015-07-16 DIAGNOSIS — R918 Other nonspecific abnormal finding of lung field: Secondary | ICD-10-CM

## 2015-07-16 DIAGNOSIS — R6889 Other general symptoms and signs: Secondary | ICD-10-CM

## 2015-07-16 DIAGNOSIS — K219 Gastro-esophageal reflux disease without esophagitis: Secondary | ICD-10-CM

## 2015-07-16 DIAGNOSIS — J411 Mucopurulent chronic bronchitis: Secondary | ICD-10-CM

## 2015-07-16 HISTORY — DX: Other nonspecific abnormal finding of lung field: R91.8

## 2015-07-16 NOTE — Progress Notes (Signed)
Subjective:    Patient ID: Heather [name], female    DOB: [birth year], 81 y.o.   MRN: [id number]  HPI 81 y/o BF here for a follow up visit... she has multiple medical problems including:  Dyspnea & Anxiety;  HBP;  Hx CP followed by DrBensimhon;  HH/ Gastritis;  Divertics/ Constip;  Functional Abd Pain;  Renal cyst followed by DrKimbrough;  Hx left breast [phone];  DJD/ CSpine spondy/ LBP/ CTS; and she has a cancer phobia...   ~  SEE PREV EPIC NOTES FOR OLDER DATA >> She has established Primary Care follow up w/ DrJohn => DrHusain, Leodis Binet...   CXR 10/14 showed normal heart size w/ tortuous atherosclerotic Ao, clear lungs w/ ?vague opac left mid zone adjacent to hilum (nothing seen in this area on CT 3/14) ?overlying shadows & we will f/u...  Ambulatory O2 check> O2 sat= 98% on room air at rest w/ pulse=83, regular; Ambulated 3 laps w/ lowest O2 sat= 96% w/ pulse=108...  LABS 10/14:  Chems- wnl;  CBC- wnl;  TSH=2.04;  VitD=71;  Sed=11...   ~  Apr 04, 2014:  36mo ROV & Heather [name] has established w/ DrJohn for Primary care and has seen him x3 already... She still sees DrCrosslry for ENT and DrStark for GI>>    From the Pulmonary standpoint she is c/o similar symptoms to prev- cough, harking up thick balls of white phlegm "it's so bad- big lumps, it's so slimy that it just jumps out, it's unbearable"; she notes some mild SOB/DOE but not progressive & by all accounts she gets along remarkably well for 81 y/o; she denies f/c/s but notes "I get hot"; she denies CP, palpit, dizzy, edema; prev asked to take Mucinex but she wouldn't do it because DrCrossley told her not to for some reason but she is no better on his regimen...    BASELINE DATA>>   Baseline CXRs showed sl overinflation, clear lungs, normal heart size w/ elongation & tortuous Ao...  Prev CT Chest/ CTA 3/14 showed no PE, no acute findings, +atherosclerotic changes, several scattered tiny nodules 3-69mm size & no change from 2009  scan, s/p left mastectomy, DJD Tspine...   PFTs 9/13 were WNL> FVC=2.62 (128%), FEV1=2.20 (158%), %1sec=84; mid-flows=274% of predicted  CXR 04/04/14 shows stable heart size, atherosclerotic & tortuous Ao, mild hyperinflation, clear lungs w/o acute changes, DJD Tspine...  PLAN:  She is asked to start [facility] 600mg  one tab Qid w/ plenty of water; and DELSYM 2 tsp po Qhs...  I tried to give her maximum reassurance & we will recheck her in 3 months...  ~  July 03, 2014:  41mo ROV & in the interval Heather [name] has seen DrJohn x3, DrStark x1, and went to the [facility] (all notes reviewed)-  regarding her abd discomfort, wt loss, LPR/ coking/ coughing...    From the pulmonary standpoint she says she is breathing OK, then she says she meant "terrible" and notes the "I hark a whole lot" stating the phlegm is thick & bitter w/ big lumps & wakes her up at 3-4AM; she cannot articulate any real SOB/DOE & she remains active;  She stopped the prev Mucinex and uses Delsym prn- as noted prev PFT in 2013 was wnl;  She has mult mult somatic complaints...  We discussed rechecking PFT & Ambulatory O2 sat test today...  PFT > despite mult attempts w/ several test administrators/ staff- she was unable to perform the procedure due to inability to cooperate...  Ambulatory O2sat Test on  RA>  Baseline O2sat= 99% w/ pulse=86;  After 3 laps her nadir O2sat= 96% w/ max pulse= 114... PLAN>> we decided to add HUMIBID-DM one cap Bid w/ Fluids (she won't take Mucinex due to something that DrCrossley said yrs ago); we reviewed the important ANTI-REFLUX regimen to help her nocturnal symptoms...  ~  September 21, 2014:  57mo ROV & pt added-on today at her request for mult symptoms> but on questioning it is apparent that her symptomatology is chronic w/o acute features; she notes cough ("hocking cough"), thick white sputum ("with white balls"), chest congestion; but she denies to me any f/c/s, CP, hemoptysis, etc and she notes that "my breathing is  good";;;  Then she launches into a litany of complaints- poor appetite, wt loss ("I'm nothing but a frame"), back pain ("the drawing kind"), and general flight of ideas;  Meds reviewed & include prev ZPak & Mucinex (?taking 1 Qid);  Exam is neg w/ clear chest- no wheezing, rales, or rhonchi hear;  Last CXR was 5/15 showing essentially clear lungs, NAD...    She saw Cherly Hensen for Cards 9/15> Hx HBP, CHF/ DD, AtypCP, RBBB, neg Myoview 2009; on Coreg3.125Bid, Amlod2.5, Lasix40 as needed for swelling; BP= 112/60 today...     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     Hx mult Ortho complaints and LBP evaluated by DrWhitfield w/ Epid shots per DrNewton but she claims no improvement; notes Tramadol doesn't help and want Vicodin refilled, she wears a back brace...     Very anxoius and has Celexa10 & Alprazolam 0.5mg  on her med list...   EKG 9/15 showed NSR, rate81, LAD, RBBB, NSSTTWA, NAD...  PLAN>>  Heather [name] is reassured- her breathing is stable w/o acute lung problems at this time; she is asked to continue the Mucinex600mg  Qid w/ fluids, and remain as active as possible; reminded to use her ProtonixBid & follow antireflux regimen to help her LPR;  She will f/u w/ her other physicians and see me in 6 months for re-evaluation & f/u CXR...  ~  Mar 22, 2015:  9mo ROV & Heather [name] says "I'm just skin & bones" weight is noted to be down 15# to 120# today (BMI=22);  She says that her dentist noted her teeth are wearing out & this may acct for some wt loss- needs to incr nutritional supplement like BOOST Bid;  She persists w/ mult medical complaints- "this swallowing business" and she is seeing both [facility] and [facility] for help w/ reflux and cancer phobia "it can show up anywhere";  From the resp standpoint she notes same "hocking" throat clearing cough w/  thick, white, foamy sput; she notes SOB/DOE w/o change and she is still not taking the Alpraz or the Mucinex as requested...      She saw DrJohn 1/16> note reviewed & felt to be stable... She tells me she has switched Primary Care to [facility]...      She saw [facility] 1/16 and they felt no further GI w/u warranted, use abd binder prn...      She saw DrEdwards, GI 2/16> bloating, constip, wt loss> note reviewed EXAM reveals Afeb, VSS, O2sat=99% on RA;  HEENT- neg x sl dry MMs;  Chest- she has a forced dry hacking cough that is nonproductive, clear w/o w/r/r;  Heart- RR w/o m/r/g heard;  Abd- soft, nontender, neg.Marland KitchenMarland Kitchen  We reviewed prob list, meds, xrays and labs> see below for updates >>   LABS 11/2014 were WNL.Marland KitchenMarland Kitchen  CXR 03/22/15 showed stable cardiomeg, chronic changes, old right rib fxs, NAD...  PLAN>>  Please Heather [name] take the Mucinex600mg - one tab Qid w/ fluids, and try the Alprazolam 0.5mg  1/2 tab Tid to start; I have tried to reassure her as best I can, she needs to eat better & take the nutritional supplement Bid...  (NOTE> I spent 47min face-to-face w/ pt discussing her symptoms, examining pt, reviewing recommendations, and answering questions)  ~  July 16, 2015:  30mo ROV & Heather [name] presents w/ her usual litany of somatic complaints- "terrible" she says; c/o feeling hot, eyes run water, had dental work done, still w/ mult GI issues, unhappy w/ PCP, etc... She saw DrEdwards for GI eval & his note is reviewed; she went to the ER 05/17/15 w/ GI complaints and the ER physician ordered a CT Abd & Pelvis> a few tiny 3-59mm nodules were noted in the lateral aspect of the RML, the abd revealed aortoiliac calcif w/o aneurysm, right renal cyst, otherw no signif abn noted (Radiology rec f/u CT Chest in 13yr); she is very concerned and I indicated that I thought this was prob some scar tissue but we would recheck her CXR in 55mo & follow up the CT in 80yr...     AB> she c/o cough "hoking little white balls"; baseline CXRs  have been clear (old right rib fxs), PFT in 2013 was wnl, oxygenation is wnl, and exam is clear; she has been asked to use Mucinex Qid but she won't do it...    Abn CT scan w/ scat pulm nodules> prev CT Chest w/ tiny left apical oapc, unchanged serially & felt to be benign; CT Abd done 05/17/15 w/ tiny 3-66mm RML nodules detected, otherw eg & we discussed f/u CXR in 21mo & CT Chest in 38yr.     HBP> on Coreg3.125Bid, Lasix40, off Amlod; BP= 106/60 & she persists w/ mult somatic complaints; last 2DEcho 10/2009 showed norm LV size & func w/ EF=55-60%, mildAI....    HxCP> on ASA81; followed by DrBensimhon/ DrNishan for Cards & their prev notes are reviewed=> neg ischemic work up previously...     VI, Edema> she went to the ER 11/13 w/ edema; VDopplers were neg for DVT; they reiterated the recs for no salt, elevation, support hose, & continue Lasix40.     GI- HH, Gastirits, Divertics, chronic pain & "swelling"> on Protonix40Bid plus Zantac, Miralax & Senakot-S, etc; she persists w/ mult GI complaints despite prev evals by [facility], [facility], [facility]-DrThorne...    Renal cyst> she is very anxious about everything & has a cancer phobia; DrKimbrough followed this benign simple cyst for yrs- no change...     Hx Breast cancer> she had left mod radical mastectomy 1994 by DrBlievernicht- no known recurrence & she is given reassurance...     Ortho- DJD, Cspine spondy, LBP, CTS> followed by DrWhitfield & he injected her right knee & her back; on Vicodin prn...    Anxiety> on Alpraz0.5mg Tid but "I'm not taking it very often" she says & encouraged to use more regularly...  We reviewed prob list, meds, xrays and labs> see below for updates >> her PCP is Artist Beach, Animal nutritionist at [place]...  Ba swallow 03/23/15 showed espohageal dysmotility w/ full column stasis and tertiart contractions, no stricture detected...  CT Abd & Pelvis 05/17/15 showed a few tiny 3-27mm nodules were noted in the lateral aspect of  the RML, the abd revealed  aortoiliac calcif w/o aneurysm, right renal cyst, otherw no signif abn noted (Radiology rec f/u CT Chest in 73yr).  LABS 05/17/15 via ER> Chems- wnl;  CBC- wnl... IMP/PLAN>>  I told Heather [name] that the tiny nodules seen in the RML area on CT Abd were likely benign & reassured her that we would follow this area closely w/ CXR in 58mo & CT Chest in 7mo; in the meanwhile she will continue her current meds & maintain follow up w/ her PCP & various specialists as needed...            Problem List:  RESPIRATORY SYMPTOMS >> c/o "hocking phlegm" DYSPNEA (ICD-786.9) - eval 5/11 c/o SOB- can't get a DB, can't get enough air in, etc... occurs at rest & w/ exerc "I'm very active despite my arthritis", talks rapidly in long sentences w/o apparent distress... no cough, phlegm, hemoptysis, etc- but as usual she is quite distressed by these symptoms and wants further eval> we discussed re-assessment w/ CXR (clear, NAD);  PFT (totally norm airflow);  Labs (all WNL);  Rx w/ ALPRAZOLAM 0.5mg  Tid & encouraged to take regularly. ~  CXR 4/13 showed norm heart size, clear lungs, mild TSpine spondylosis... ~  She continues to complain of drainage, throat symptoms, etc; Rx w/ Mucinex, OTC antihist, Nasonex, etc;  Seen by ENT, DrRosen w/ ?xerostomia & rec incr water etc. ~  9/13: Add-on visit for "hocking, it's not a cough, it's a hock"> see above... ~  9/13: CXR shows sl overinflation, clear lungs, normal heart size w/ elongation & tortuous Ao;  PFTs are wnl> FVC=2.62 (128%), FEV1=2.20 (158%), %1sec=84; mid-flows=274% of predicted... We discussed trial rx for her symptoms- Mucinex, Fluids, Hycodan, Dulera100- 1puffbid. ~  10/13: she reports INTOL to [facility] w/ throat & mouth symptoms "like glue" therefore stopped this med; advised to continue the others. ~  3/14:  CT Chest (done after call from Ortho PA regarding her symptoms)=> then CTA (done via an ER visit) showed no PE, no acute findings, +atherosclerotic changes, several  scattered tiny nodules 3-50mm size & no change from 2009 scan, s/p left mastectomy, DJD Tspine...  ~  4/14: she is asked to increase the Mucinex600mg  to [facility] + plenty of water daily... ~  10/14: CXR showed normal heart size w/ tortuous atherosclerotic Ao, clear lungs w/ ?vague opac left mid zone adjacent to hilum (nothing seen in this area on CT 3/14) ?overlying shadows & we will f/u...  ~  CXR 04/04/14 shows stable heart size, atherosclerotic & tortuous Ao, mild hyperinflation, clear lungs w/o acute changes, DJD Tspine... ~  8/15 & 11/15> she remains stable from the pulmonary standpoint on Mucinex600Qid + Fluids; rec to follow better antireflux regimen for her cough, LPR, choking...  ~  CXR 03/22/15 showed stable cardiomeg, chronic changes, old right rib fxs, NAD ~  She went to the ER 05/17/15 w/ GI complaints and the ER physician ordered a CT Abd & Pelvis> a few tiny 3-15mm nodules were noted in the lateral aspect of the RML, the abd revealed aortoiliac calcif w/o aneurysm, right renal cyst, otherw no signif abn noted (Radiology rec f/u CT Chest in 38yr); she is very concerned and I indicated that I thought this was prob some scar tissue but we would recheck her CXR in 101mo & follow up the CT in 45yr.Marlana Salvage HAS ESTABLISHED w/ DrHUSAIN from [facility] (prev DrJohn at [facility])  HYPERTENSION (ICD-401.9) -  ~  on AMLODIPINE 2.5mg /d,  COREG 3.125mg Bid,  LASIX 40mg - ?taking 1/2tab/d or just using it prn swelling... ~  8/16: her meds have been adjusted> on Coreg3.125Bid, Lasix40, off Amlod; BP= 106/60 & she persists w/ mult somatic complaints; last 2DEcho 10/2009 showed norm LV size & func w/ EF=55-60%, mildAI.  Hx of CHEST PAIN (ICD-786.50) - she takes ASA 81mg /d + Tylenol Prn... followed by DrBensimhon for Cards. ~  Baseline EKG w/ RBBB & LAD, no acute changes... ~  NuclearStressTest done 2007 & 2009 showed normal- without scar or ischemia, EF=67%...   ~  2DEcho 7/08 showed mild AI, mild diastolic  dysfunction, TG=62-69%. ~  CT Abd 1/09 & 9/12 showed incidental coronary calcif, & aorto-iliac atherosclerotic changes as well... ~  8/13: she saw DrNishan> HBP, DD, atypCP w/ neg Myoviews & no changes made... ~  10/13:  She uses a back brace/ abd binder to help her CWP... ~  EKG 9/15 showed NSR, rate81, LAD, RBBB, NSSTTWA, NAD...  VENOUS INSUFFICIENCY (ICD-459.81) - she has mod VI and follows a low sodium diet, elevates legs when able, and wears support hose. ~  7/12:  C/o incr edema & we reviewed a 2gm sodium diet & increased her Lasix from 20mg /d to 40mg /d... ~  5/13:  She has persist complaints but mild chr VI w/ min edema & reminded to elim sodium, elev, support hose, take the Lasix... ~ 11/13: she went to the ER w/ edema; VDopplers were neg for DVT; they reiterated the recs for no salt, elevation, support hose, & continue Lasix40. ~  She has persistent edema in LEs and rec to avoid sodium, elev legs, wear support hose...  HIATAL HERNIA (ICD-553.3) & GASTRITIS (ICD-535.50) >>  ~  on OMEPRAZOLE 20mg /d & followed by DrJEdwards for GI having fired DrStark & [facility] in past. ~  last EGD was 2/08 w/ 3cm HH & gastitis... ~  CT Abd 1/09 revealed calcif gallstones, & mild ectasia of AA... ~  CT Abd 1/11 showed left colon divertics, tiny hepatic lesions w/o change & right renal cyst stable. ~  CT Abd 9/12 showed divertics (no inflamm), right renal & left hep lobe cysts, atherosclerotic changes, NAD... ~  Seen by DrEdwards 4/13> c/o dysphagia & congestion; he notes mult GI complaints, known esoph dysmotility w/o stricture, hard to pin down her complaints... ~  She has followed up w/ DrStark regarding her concerns about swelling in her abd... ~  CT Abd Pelvis 3/14 showed Tiny cysts in the lateral segment left hepatic lobe, 1.4 cm right upper pole renal cyst, atherosclerotic calcifications of the abdominal aorta and branch vessels, DJD spine... ~  2015: now on Protonix40Bid but not taking regularly &  advised BID dosing & antireflux regimen for her LPR...   DIVERTICULOSIS, COLON (ICD-562.10) - followed regularly by DrStark. ~  colonoscopy 7/03 by DrStark showing divertics only... we discussed Miralax + Senakot-S for constipation. ~  colonoscopy 2/11 showed divertics, 7mm polyp, hems...  ? of ABDOMINAL PAIN & SYMPTOMATOLOGY - she had a very thorough work up in 2009> she has a cancer phobia and is very difficult to reassure her that everything appropriate has been done & no cancer found... Mult additional GI evals since then from [facility], [facility] (DrEdwards), [facility] (DrThorne)... ~  eval by GI- DrStark 12/08 but pt wasn't satisfied w/ his examination... ~  full labs 1/09 were WNL, sed=20... ~  CT Abd/ Pelvis 1/09= no acute abn in abd (calcif gallstones & mild ectasia of AA), calcif  fibroid in the pelvis, & ?regarding the left ovary. ~  referral to DrFontaine, GYN> his notes are reviewed... ~  follow up w/ DrKimbrough, Urology> his notes are reviewed... ~  eval DrWhitfield for Orthopedics- rec shots in back by [facility] w/ some improvement. ~  repeat GI eval 1/11 by DrStark w/ CT & colonoscpy as above- no acute problems. ~  5/11 & 7/11 >> persist complaints w/ neg recheck by DrStark & DrKimbrough... ~  12/11:  routine GYN surveillance DrFontaine found anteverted uterus w/ fibroids, atrophic ovaries bilat, no mass. ~  Repeat GI eval 4/12 by DrStark> he tried to reassure the pt, felt some symptoms might be from poor abd wall muscle tone, rec continue Omep, laxatives... ~  6/12:  She sought another GI opinion DrEdwards> he did not feel she needed any additional testing... ~  She tells me she has yet another appt to see a gastroenterologist at [facility]==> seen by DrThorne 8/12 & note reviewed... ~  EGD by DrThorne 9/12 w/ mult whitish nodular plaques in cardia & fundus> Bx results pending & she is very concerned (remember her hx cancer phobia). ~  She continues to f/u w/ DrThorne at [facility] & is happy w/  her eval... ~  Prev back w/ DrStark at [facility] => then switched to DrEdwards at [place], GI.Marland Kitchen.  ~  CT Abd & Pelvis 05/17/15 via ER showed a few tiny 3-93mm nodules were noted in the lateral aspect of the RML, the abd revealed aortoiliac calcif w/o aneurysm, right renal cyst, otherw no signif abn noted (Radiology rec f/u CT Chest in 82yr).  RENAL CYST (ICD-593.2) - she has a 1 cm right renal cyst seen on CT in 2007 and followed by DrKimbrough... f/u CT Abd 1/09 - no change in the cyst... f/u renal ultrasound 1/10 w/ small bilat cysts... f/u CT Abd 1/11- no change in cyst. ~  she saw DrKimbrough 7/11 for LBP, microhematuria, urge incontinence- stable, no additional Rx. ~  she saw DrKimbrough 1/12 for urge incontinence, freq, nocturia> rec Desmopressin 0.2mg - 1/2 tab Qhs. ~  No change in the renal cyst over the yrs...  CARCINOMA, BREAST, LEFT (ICD-174.9) - Surgery 9/94 by DrBlievernicht w/ Left modified radical mastectomy... +estrogen receptors and Rx w/ Tamoxifen... f/u mammograms all neg... breast exam on right has been neg- no nodules palpated... She says that DrBertrand insisted that her PCP check her right breast at each & every office visit to be sure she doesn't develop any nodules...  DEGENERATIVE JOINT DISEASE (ICD-715.90) - on OTC meds + VICODIN Prn... she has end-stage OA of Right Knee per DrWhitfield treated w/ shots in the past... she has a knee brace and a cane for ambulation... ~  8/13:  She wants a better pain pill & we discussed change to Bond prn... ~  12/13: she tells me that DrWhitfield injected her knee last wk- improved...  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 Rx w/ rest, heat, Percocet, and f/u w/ Ortho...  Hx of LOW BACK PAIN, CHRONIC (ICD-724.2) - eval DrWhitfield w/ rec for shots by DrNewton & she's feeling sl better & wears back brace... ~  5/10: mult somatic complaints- primarily  c/o left side/ postero-lat rib pain...  Exam w/ tender left lat/ post ribs... Bone Scan= neg... ~  MRI Lumbar spine 7/10 by DrWhitfield... ~  Epidural steroid shot by DrNewton 8/10 & 1/11 ~  Repeat LBP eval by  DrWhitfield w/ another MRI lumbar spine showing degenerative changes w/ mild progression since 7/10 MRI, mild-mod sp stenosis & lat recess stenosis at several levels ~  DrWhitfield referred her to [facility] to consider more shots but she is not so inclined... ~  6/13: she saw DrTooke w/ lumbar spondylosis; he felt that he had nothing to offer her & rec incr Tramadol to Qid... ~  2014:  She has had numerous visits w/ DrWhitfield/ Ernest[facility] Patches for injections, XRays, scans etc...  CARPAL TUNNEL SYNDROME, BILATERAL (ICD-354.0) - she had prev right carpal tunnel release by DrWhitfield and was c/o increasing discomfort in her left wrist despite wrist splint Qhs- had left carpal tunnel release 1/10 by DrWhitfield...  ANXIETY (ICD-300.00) - on ALPRAZOLAM 0.5mg  Tid & encouraged to take it regularly... she has a cancer phobia & it is very difficult to reassure her regarding her symptoms and our evaluations... ~  7/13: she went to the ER c/o swelling of the tongue but eval by EDP indicated that her tongue was normal & not swollen, she was not on an ACE, given Pred Rx anyway. ~  She has been asked to incr the ALprazolam to regular dosing but she is reluctant...   Past Surgical History  Procedure Laterality Date  . Left mastectomy    . Carpal tunnel release      Outpatient Encounter Prescriptions as of 07/16/2015  Medication Sig  . ALPRAZolam (XANAX) 0.5 MG tablet TAKE 1/2 TO 1 TABLET THREE TIMES DAILY AS NEEDED (Patient taking differently: TAKE 0.25MG  TO 0.5MG  DAILY AS NEEDED FOR ANXIETY)  . aspirin EC 81 MG tablet Take 81 mg by mouth daily.  . bisacodyl (DULCOLAX) 5 MG EC tablet Take 5 mg by mouth daily as needed for mild constipation or moderate constipation.  . carvedilol (COREG) 3.125 MG tablet Take  1 tablet (3.125 mg total) by mouth 2 (two) times daily.  . Cholecalciferol (VITAMIN D3) 1000 UNITS CAPS Take 1,000 Units by mouth daily. For your bones  . fluticasone (FLONASE) 50 MCG/ACT nasal spray Place 2 sprays into both nostrils daily.  . furosemide (LASIX) 40 MG tablet Take 1 tablet (40 mg total) by mouth daily. For swelling  . Multiple Vitamins-Minerals (MULTIVITAMINS THER. W/MINERALS) TABS Take 1 tablet by mouth daily.  . pantoprazole (PROTONIX) 40 MG tablet Take 1 tablet (40 mg total) by mouth 2 (two) times daily.  . polyethylene glycol (MIRALAX / GLYCOLAX) packet Take 17 g by mouth 2 (two) times daily as needed for mild constipation.  . ranitidine (ZANTAC) 75 MG tablet Take 1 tablet (75 mg total) by mouth 2 (two) times daily.  . simethicone (MYLICON) 80 MG chewable tablet Chew 80 mg by mouth every 6 (six) hours as needed for flatulence.  . sodium chloride (OCEAN) 0.65 % SOLN nasal spray Place 1-2 sprays into both nostrils daily as needed for congestion.  Marland Kitchen amLODipine (NORVASC) 2.5 MG tablet Take 1 tablet (2.5 mg total) by mouth daily. (Patient not taking: Reported on 05/17/2015)  . citalopram (CELEXA) 10 MG tablet Take 1 tablet (10 mg total) by mouth daily. (Patient not taking: Reported on 05/17/2015)  . HYDROcodone-acetaminophen (NORCO/VICODIN) 5-325 MG per tablet TAKE ONE TABLET EVERY 6 HOURS AS NEEDED FOR SEVERE PAIN (Patient not taking: Reported on 04/30/2015)  . polyethylene glycol powder (GLYCOLAX) powder Take 17 g by mouth 2 (two) times daily. (Patient not taking: Reported on 04/30/2015)  . sucralfate (CARAFATE) 1 GM/10ML suspension Take 10 mLs (1 g total) by mouth 4 (four) times daily -  with meals and at bedtime. (Patient not taking: Reported on 05/17/2015)   No facility-administered encounter medications on file as of 07/16/2015.    Allergies  Allergen Reactions  . Fentanyl     REACTION: nausea    Current Medications, Allergies, Past Medical History, Past Surgical History,  Family History, and Social History were reviewed in [employer] record.    Review of Systems      See HPI - all other systems neg except as noted... She has mult somatic complaints and anxiety. The patient complains of decreased hearing, chest discomfort, dyspnea on exertion, abdominal pain, incontinence, muscle weakness, and difficulty walking.  The patient denies anorexia, fever, vision loss, hoarseness, syncope, peripheral edema, prolonged cough, headaches, hemoptysis, melena, hematochezia, severe indigestion/heartburn, hematuria, transient blindness, depression, abnormal bleeding, enlarged lymph nodes, and angioedema.     Objective:   Physical Exam   WD, WN, 81 y/o BF in NAD... she is very anxious... GENERAL:  Alert & oriented; pleasant & cooperative... HEENT:  [place]/AT, EOM-wnl, PERRLA, EACs-clear, TMs-wnl, NOSE-clear, THROAT-clear & wnl, no lesions seen. NECK:  Neck ROM decreased, no JVD; normal carotid impulses w/o bruits; no thyromegaly or nodules palpated; no lymphadenopathy. CHEST:  Clear to P & A; without wheezes/ rales/ or rhonchi heard... BREAST:  s/p left mastectomy... right breast normal- without nodules palpated... HEART:  Regular Rhythm; without murmurs/ rubs/ or gallops detected... ABDOMEN:  Soft & nontender; normal bowel sounds; no organomegaly or masses palpated... EXT: without deformities, mod arthritic changes; no varicose veins/ +venous insuffic/ tr edema. NEURO:  CN's intact; no focal neuro deficits... DERM:  Small sl excoriated area of dermatitis left shin- no drainage,no rash, etc...  RADIOLOGY DATA:  Reviewed in the EPIC EMR & discussed w/ the patient...  LABORATORY DATA:  Reviewed in the EPIC EMR & discussed w/ the patient...   Assessment & Plan:    RESPIRATORY SYMPTOMS w/ Dyspnea & throat clearing, prob LPR>  She had a neg pulm eval 2011 and numerous times thereafter; she insisted on additional eval 9/13 for her chr throat clearing  symptom; CXR w/o acute changes & PFTs normal for her 81 y/o lungs; she is quite insistent on med rx for her problem but has not had relief from anything; REC to take [id number], Fluids, DELSYM prn; pt very anxious & Alprazolam seems to help when she takes it... 11/15> she has been taking the OTC MUCINEX 600mg  Qid w/ fluids and seems sl improved; rec strict antireflux regimen w/ Protonix before dinner, NPO after dinner, elev HOB 6" blocks... 5/16> she is once again off all these meds and c/o incr symptoms; rec to restart the Mucinex, fluids, and antireflux regimen...  Abn CT Abd w/ several tiny nodules reported in the RML area>  We discussed this & decided to f/u CXR in 45mo & CT Chest in 72yr...   Hx CP> evaluated for Cards by DrBensimon & he has not been in favor of invasive testing; stable on conservative med rx...  GI> HH, Gastritis, Divertics, vague abd pain>  I tried once again to reassure her about the thorough GI evaluations that her has undergone; she is happy w/ DrThorne's eval & will f/u w/ her & in the meanwhile continue rx w/ Prilosec40, Miralax, Metamucil, Senakot, Mylicon, etc... She has re-established w/ [facility]- their notes reviewed...  Hx left Breast Cancer>  No known recurrence...  Anxiety>  Asked once again to increase the Alprazolam to 1/2 - 1 tab TID regularly...   Patient's Medications  New Prescriptions   No medications on file  Previous Medications   ALPRAZOLAM (XANAX) 0.5 MG TABLET    TAKE 1/2 TO 1 TABLET THREE TIMES DAILY AS NEEDED   AMLODIPINE (NORVASC) 2.5 MG TABLET    Take 1 tablet (2.5 mg total) by mouth daily.   ASPIRIN EC 81 MG TABLET    Take 81 mg by mouth daily.   BISACODYL (DULCOLAX) 5 MG EC TABLET    Take 5 mg by mouth daily as needed for mild constipation or moderate constipation.   CARVEDILOL (COREG) 3.125 MG TABLET    Take 1 tablet (3.125 mg total) by mouth 2 (two) times daily.   CHOLECALCIFEROL (VITAMIN D3) 1000 UNITS CAPS    Take 1,000 Units by  mouth daily. For your bones   CITALOPRAM (CELEXA) 10 MG TABLET    Take 1 tablet (10 mg total) by mouth daily.   FLUTICASONE (FLONASE) 50 MCG/ACT NASAL SPRAY    Place 2 sprays into both nostrils daily.   FUROSEMIDE (LASIX) 40 MG TABLET    Take 1 tablet (40 mg total) by mouth daily. For swelling   HYDROCODONE-ACETAMINOPHEN (NORCO/VICODIN) 5-325 MG PER TABLET    TAKE ONE TABLET EVERY 6 HOURS AS NEEDED FOR SEVERE PAIN   MULTIPLE VITAMINS-MINERALS (MULTIVITAMINS THER. W/MINERALS) TABS    Take 1 tablet by mouth daily.   PANTOPRAZOLE (PROTONIX) 40 MG TABLET    Take 1 tablet (40 mg total) by mouth 2 (two) times daily.   POLYETHYLENE GLYCOL (MIRALAX / GLYCOLAX) PACKET    Take 17 g by mouth 2 (two) times daily as needed for mild constipation.   POLYETHYLENE GLYCOL POWDER (GLYCOLAX) POWDER    Take 17 g by mouth 2 (two) times daily.   RANITIDINE (ZANTAC) 75 MG TABLET    Take 1 tablet (75 mg total) by mouth 2 (two) times daily.   SIMETHICONE (MYLICON) 80 MG CHEWABLE TABLET    Chew 80 mg by mouth every 6 (six) hours as needed for flatulence.   SODIUM CHLORIDE (OCEAN) 0.65 % SOLN NASAL SPRAY    Place 1-2 sprays into both nostrils daily as needed for congestion.   SUCRALFATE (CARAFATE) 1 GM/10ML SUSPENSION    Take 10 mLs (1 g total) by mouth 4 (four) times daily -  with meals and at bedtime.  Modified Medications   No medications on file  Discontinued Medications   No medications on file

## 2015-07-16 NOTE — Patient Instructions (Signed)
Today we updated your med list in our EPIC system...    Continue your current medications the same...  We reviewed your recent CT scan...  Let's plan a follow up visit in 6 months w/ follow up CXR at that time.Marland KitchenMarland Kitchen

## 2015-07-26 ENCOUNTER — Ambulatory Visit
Admission: RE | Admit: 2015-07-26 | Discharge: 2015-07-26 | Disposition: A | Discharge status: Home / Self Care | Payer: Medicare Other | Source: Ambulatory Visit | Attending: Internal Medicine | Admitting: Internal Medicine

## 2015-07-26 ENCOUNTER — Other Ambulatory Visit: Payer: Self-pay | Admitting: Internal Medicine

## 2015-07-26 DIAGNOSIS — R0789 Other chest pain: Secondary | ICD-10-CM

## 2015-08-13 ENCOUNTER — Encounter: Payer: Self-pay | Admitting: *Deleted

## 2015-08-13 NOTE — Progress Notes (Signed)
Patient ID: Heather [name], female   DOB: May 19, 1918, 81 y.o.   MRN: [id number] Heather [name] is an  81 y.o.  pateint of Dr Jeffie Pollock  with a history of congestive heart failure secondary to diastolic dysfunction, hypertension, atypical chest pain with negative Myoviews in 2007 and 2009, breast cancer status post left mastectomy Chronic phlem and cough with white mucos. On mucinex but fixated on this issue. No recent dyspnea or CHF. Recently seen by Dr Oletta [facility] with no recommendations. Is on prilosec She is very functional Has some LE edema on left leg from knee brace Also wearing back brace Sees [facility] for ortho  Complains of LLQ pain but CT negative Chronic edema Still with complaints about mucus   ROS: Denies fever, malais, weight loss, blurry vision, decreased visual acuity, cough, sputum, SOB, hemoptysis, pleuritic pain, palpitaitons, heartburn, abdominal pain, melena, lower extremity edema, claudication, or rash.  All other systems reviewed and negative  General: Affect appropriate Healthy:  appears stated age 39: normal Neck supple with no adenopathy JVP normal no bruits no thyromegaly Lungs clear with no wheezing and good diaphragmatic motion Heart:  S1/S2 no murmur, no rub, gallop or click PMI normal Abdomen: benighn, BS positve, no tenderness, no AAA no bruit.  No HSM or HJR Distal pulses intact with no bruits Plus one bilateal  edema Neuro non-focal Skin warm and dry No muscular weakness   Current Outpatient Prescriptions  Medication Sig Dispense Refill  . ALPRAZolam (XANAX) 0.5 MG tablet Take 0.5 mg by mouth at bedtime as needed for anxiety.    Marland Kitchen amLODipine (NORVASC) 2.5 MG tablet Take 1 tablet (2.5 mg total) by mouth daily. (Patient not taking: Reported on 05/17/2015) 90 tablet 3  . aspirin EC 81 MG tablet Take 81 mg by mouth daily.    . bisacodyl (DULCOLAX) 5 MG EC tablet Take 5 mg by mouth daily as needed for mild constipation or moderate constipation.    .  carvedilol (COREG) 3.125 MG tablet Take 1 tablet (3.125 mg total) by mouth 2 (two) times daily. 180 tablet 3  . Cholecalciferol (VITAMIN D3) 1000 UNITS CAPS Take 1,000 Units by mouth daily. For your bones    . citalopram (CELEXA) 10 MG tablet Take 1 tablet (10 mg total) by mouth daily. (Patient not taking: Reported on 05/17/2015) 90 tablet 3  . fluticasone (FLONASE) 50 MCG/ACT nasal spray Place 2 sprays into both nostrils daily. 16 g 2  . furosemide (LASIX) 40 MG tablet Take 1 tablet (40 mg total) by mouth daily. For swelling 90 tablet 3  . HYDROcodone-acetaminophen (NORCO/VICODIN) 5-325 MG per tablet TAKE ONE TABLET EVERY 6 HOURS AS NEEDED FOR SEVERE PAIN (Patient not taking: Reported on 04/30/2015) 30 tablet 0  . Multiple Vitamins-Minerals (MULTIVITAMINS THER. W/MINERALS) TABS Take 1 tablet by mouth daily.    . pantoprazole (PROTONIX) 40 MG tablet Take 1 tablet (40 mg total) by mouth 2 (two) times daily. 60 tablet 11  . polyethylene glycol (MIRALAX / GLYCOLAX) packet Take 17 g by mouth 2 (two) times daily as needed for mild constipation.    . polyethylene glycol powder (GLYCOLAX) powder Take 17 g by mouth 2 (two) times daily. (Patient not taking: Reported on 04/30/2015) 527 g 1  . ranitidine (ZANTAC) 75 MG tablet Take 1 tablet (75 mg total) by mouth 2 (two) times daily. 28 tablet 0  . simethicone (MYLICON) 80 MG chewable tablet Chew 80 mg by mouth every 6 (six) hours as needed for flatulence.    Marland Kitchen  sodium chloride (OCEAN) 0.65 % SOLN nasal spray Place 1-2 sprays into both nostrils daily as needed for congestion.    . sucralfate (CARAFATE) 1 GM/10ML suspension Take 10 mLs (1 g total) by mouth 4 (four) times daily -  with meals and at bedtime. (Patient not taking: Reported on 05/17/2015) 420 mL 0   No current facility-administered medications for this visit.    Allergies  Fentanyl  Electrocardiogram:  SR rate 81  RBBB LAFB   Assessment and Plan HTN:  Well controlled.  Continue current medications  and low sodium Dash type diet.   Edema:  Stable continue current dose diuretic GERD:  On carafate f/u primary Diastolic CHF:  Resolved  Continue current diuretic and Rx for HTN  F/u with me PRN  Jenkins Rouge

## 2015-08-14 ENCOUNTER — Encounter: Payer: Medicare Other | Admitting: Cardiovascular Disease

## 2015-08-16 ENCOUNTER — Encounter: Payer: Self-pay | Admitting: Cardiovascular Disease

## 2015-08-21 ENCOUNTER — Other Ambulatory Visit: Payer: Self-pay | Admitting: Internal Medicine

## 2015-08-21 ENCOUNTER — Ambulatory Visit
Admission: RE | Admit: 2015-08-21 | Discharge: 2015-08-21 | Disposition: A | Discharge status: Home / Self Care | Payer: Medicare Other | Source: Ambulatory Visit | Attending: Internal Medicine | Admitting: Internal Medicine

## 2015-08-21 DIAGNOSIS — M519 Unspecified thoracic, thoracolumbar and lumbosacral intervertebral disc disorder: Secondary | ICD-10-CM

## 2015-08-23 ENCOUNTER — Encounter: Payer: Self-pay | Admitting: *Deleted

## 2015-08-24 ENCOUNTER — Ambulatory Visit (INDEPENDENT_AMBULATORY_CARE_PROVIDER_SITE_OTHER): Payer: Medicare Other | Admitting: Physician Assistant

## 2015-08-24 ENCOUNTER — Encounter: Payer: Self-pay | Admitting: Physician Assistant

## 2015-08-24 VITALS — BP 108/46 | HR 74 | Ht 62.0 in | Wt 119.0 lb

## 2015-08-24 DIAGNOSIS — I10 Essential (primary) hypertension: Secondary | ICD-10-CM | POA: Diagnosis not present

## 2015-08-24 NOTE — Progress Notes (Signed)
Cardiology Office Note    Date:  08/24/2015   ID:  [name], DOB 1918/03/28, MRN [id number]  PCP:  Wenda Low, MD  Cardiologist:  Dr. Johnsie Cancel     History of Present Illness: Heather [name] is a 81 y.o. female w/ a hx of chronic diastolic CHF, HTN, atypical CP s/p normal myoview in 2007 and 2009, breast CA s/p L mastectomy, chronic phlegm/cough, chronic edema, anxiety, depression, diverticulosis/gastritis/functional abdominal pain who is added onto my FLEX clinic schedule for evaluation of labile BPs.   Last seen by Dr. Johnsie Cancel in 07/2014. He felt she was stable from a cardiac standpoint. Review of chart reveals that she constantly complains of multiple somatic complaints and always feels "terrible" w/ mult GI issues and respiratory issues with chronic cough and phlem. She is unhappy w/ PCP and has switched multiple times and see multiple providers concurrently. She sees specialists including pulmonary, GI, ortho and renal. She has multiple abnormalities noted on imaging that are being followed by various specialties. Please see Dr. [name] note from 07/16/15 to see a full list of her problems. He states that she is doing "remarklabley well for a 81 yo". He urges her to take her Xanax more frequently.  Today she is here for labile BPs.  She was taken off of amlodipine (i think from LE edema) she doesn't know and taking 1/2 Lasix. She see multiple different providers and gets confused. I asked her what her BP had been running at home. She listed some numbers (all of which were normal ~120/80). I told her these sounded normal and she agreed. She then told me that she had missed her yearly appointment with Dr. Johnsie Cancel last month and so she would like her yearly cardiac physical. A complete review of systems (including CP, SOB, orthopnea and PND) was normal. Her only complaints were LLQ pain, ongoing issues with phlegm and weight loss. These are chronic issues that she is followed for by  other specialists. She said she can't eat ice cream because of her GERD. I told her she was doing remarkably well for 81yo and I didn't want to change anything or do any tests. I also told her she could eat ice cream as it should not upset her acid reflux.    Studies:  - Echo (2010): EF 55-60%, mild AR.  - Nuclear (2009):  Normal per Dr. Johnsie Cancel note (dont see report)    Recent Labs/Images:   Recent Labs  11/30/14 1645 05/17/15 1359  NA 138 140  K 4.4 3.6  BUN 19 22*  CREATININE 0.99 0.90  ALT 22 22  HGB 14.0 13.2  TSH 2.67  --      Dg Lumbar Spine 2-3 Views  08/21/2015   CLINICAL DATA:  Back pain radiating down left leg.  EXAM: LUMBAR SPINE - 2-3 VIEW  COMPARISON:  None.  FINDINGS: Paraspinal soft tissues normal. Degenerative changes lumbar spine and both hips. Mild scoliosis. No acute bony abnormality identified.  IMPRESSION: Severe multilevel degenerative changes lumbar spine. Mild scoliosis.   Electronically Signed   By: Marcello Moores  Register   On: 08/21/2015 15:43     Wt Readings from Last 3 Encounters:  03/22/15 120 lb (54.432 kg)  11/30/14 132 lb 8 oz (60.102 kg)  11/27/14 130 lb (58.968 kg)     Past Medical History  Diagnosis Date  . RBBB (right bundle branch block with left anterior fascicular block)   . Hypertension   . Hiatal hernia   .  Gastritis, chronic   . Breast cancer ([place])     NO STICK OR BLOOD PRESSURE CHECKS IN LEFT ARM  . Degenerative joint disease   . Anxiety state, unspecified   . Low back pain   . Carpal tunnel syndrome   . Renal cyst   . Diverticula of colon   . Cervical spondylosis   . Diastolic dysfunction   . Venous insufficiency   . Constipation   . Colon polyp 12/19/2009    Transverse-polypoid colorectal mucosa  . Allergic rhinitis, cause unspecified 02/21/2014    Current Outpatient Prescriptions  Medication Sig Dispense Refill  . ALPRAZolam (XANAX) 0.5 MG tablet Take 0.5 mg by mouth at bedtime as needed for anxiety.    Marland Kitchen amLODipine  (NORVASC) 2.5 MG tablet Take 1 tablet (2.5 mg total) by mouth daily. (Patient not taking: Reported on 05/17/2015) 90 tablet 3  . aspirin EC 81 MG tablet Take 81 mg by mouth daily.    . bisacodyl (DULCOLAX) 5 MG EC tablet Take 5 mg by mouth daily as needed for mild constipation or moderate constipation.    . carvedilol (COREG) 3.125 MG tablet Take 1 tablet (3.125 mg total) by mouth 2 (two) times daily. 180 tablet 3  . Cholecalciferol (VITAMIN D3) 1000 UNITS CAPS Take 1,000 Units by mouth daily. For your bones    . citalopram (CELEXA) 10 MG tablet Take 1 tablet (10 mg total) by mouth daily. (Patient not taking: Reported on 05/17/2015) 90 tablet 3  . fluticasone (FLONASE) 50 MCG/ACT nasal spray Place 2 sprays into both nostrils daily. 16 g 2  . furosemide (LASIX) 40 MG tablet Take 1 tablet (40 mg total) by mouth daily. For swelling 90 tablet 3  . HYDROcodone-acetaminophen (NORCO/VICODIN) 5-325 MG per tablet TAKE ONE TABLET EVERY 6 HOURS AS NEEDED FOR SEVERE PAIN (Patient not taking: Reported on 04/30/2015) 30 tablet 0  . Multiple Vitamins-Minerals (MULTIVITAMINS THER. W/MINERALS) TABS Take 1 tablet by mouth daily.    . pantoprazole (PROTONIX) 40 MG tablet Take 1 tablet (40 mg total) by mouth 2 (two) times daily. 60 tablet 11  . polyethylene glycol (MIRALAX / GLYCOLAX) packet Take 17 g by mouth 2 (two) times daily as needed for mild constipation.    . polyethylene glycol powder (GLYCOLAX) powder Take 17 g by mouth 2 (two) times daily. (Patient not taking: Reported on 04/30/2015) 527 g 1  . ranitidine (ZANTAC) 75 MG tablet Take 1 tablet (75 mg total) by mouth 2 (two) times daily. 28 tablet 0  . simethicone (MYLICON) 80 MG chewable tablet Chew 80 mg by mouth every 6 (six) hours as needed for flatulence.    . sodium chloride (OCEAN) 0.65 % SOLN nasal spray Place 1-2 sprays into both nostrils daily as needed for congestion.    . sucralfate (CARAFATE) 1 GM/10ML suspension Take 10 mLs (1 g total) by mouth 4 (four)  times daily -  with meals and at bedtime. (Patient not taking: Reported on 05/17/2015) 420 mL 0   No current facility-administered medications for this visit.     Allergies:   Fentanyl   Social History:  The patient  reports that she has never smoked. She has never used smokeless tobacco. She reports that she does not drink alcohol or use illicit drugs.   Family History:  The patient's family history is negative for Colon cancer.   ROS:  Please see the history of present illness.   All other systems reviewed and negative.    PHYSICAL EXAM:  VS:  There were no vitals taken for this visit. Well nourished, well developed, in no acute distress HEENT: normal Neck: no JVD Cardiac:  normal S1, S2; RRR; no murmur Lungs:  clear to auscultation bilaterally, no wheezing, rhonchi or rales Abd: soft, nontender, no hepatomegaly Ext: no edema Skin: warm and dry Neuro:  CNs 2-12 intact, no focal abnormalities noted  EKG:  HR 74 NSR RBBB     ASSESSMENT AND PLAN:  DARENE NAPPI is a 81 y.o. female w/ a hx of chronic diastolic CHF, HTN, atypical CP s/p normal myoview in 2007 and 2009, breast CA s/p L mastectomy, chronic phlegm/cough, chronic edema, anxiety, depression, diverticulosis/gastritis/functional abdominal pain who is added onto my FLEX clinic schedule for evaluation of labile BPs.   HTN- well controlled despite her call reporting "labile pressures." BP was 108/42 in the office today. She didn't believe that number, so i manually took her BP myself and it was 122/52. -- Continue Coreg 3.125mg  BID. She is no longer taking amlodipine -- Continue low sodium Dash type diet.    LE edema- chronic. Felt to be due to venous insufficiency  -- Continue Lasix 40mg  po PRN as needed for swelling  Disposition:   FU with Dr. Johnsie Cancel in 1 year.    Signed, Vesta Mixer, PA-C, MHS 08/24/2015 7:57 AM    [facility] [address], [place], [place]  [address] Phone: [phone]; Fax: [phone]

## 2015-08-24 NOTE — Patient Instructions (Signed)
Medication Instructions:   Your physician recommends that you continue on your current medications as directed. Please refer to the Current Medication list given to you today.     Labwork: NONE ORDER TODAY    Testing/Procedures: NONE ORDER TODAY    Follow-Up:  Your physician wants you to follow-up in: [place] will receive a reminder letter in the mail two months in advance. If you don't receive a letter, please call our office to schedule the follow-up appointment.    Any Other Special Instructions Will Be Listed Below (If Applicable).

## 2015-09-21 ENCOUNTER — Emergency Department (HOSPITAL_COMMUNITY)
Admission: EM | Admit: 2015-09-21 | Discharge: 2015-09-21 | Disposition: A | Discharge status: Home / Self Care | Payer: Medicare Other | Attending: Emergency Medicine | Admitting: Emergency Medicine

## 2015-09-21 ENCOUNTER — Emergency Department (HOSPITAL_COMMUNITY): Payer: Medicare Other

## 2015-09-21 ENCOUNTER — Encounter (HOSPITAL_COMMUNITY): Payer: Self-pay

## 2015-09-21 DIAGNOSIS — R093 Abnormal sputum: Secondary | ICD-10-CM | POA: Diagnosis not present

## 2015-09-21 DIAGNOSIS — Z853 Personal history of malignant neoplasm of breast: Secondary | ICD-10-CM | POA: Diagnosis not present

## 2015-09-21 DIAGNOSIS — Z7951 Long term (current) use of inhaled steroids: Secondary | ICD-10-CM | POA: Diagnosis not present

## 2015-09-21 DIAGNOSIS — K5900 Constipation, unspecified: Secondary | ICD-10-CM | POA: Diagnosis not present

## 2015-09-21 DIAGNOSIS — Z79899 Other long term (current) drug therapy: Secondary | ICD-10-CM | POA: Diagnosis not present

## 2015-09-21 DIAGNOSIS — F419 Anxiety disorder, unspecified: Secondary | ICD-10-CM | POA: Insufficient documentation

## 2015-09-21 DIAGNOSIS — R1010 Upper abdominal pain, unspecified: Secondary | ICD-10-CM | POA: Diagnosis present

## 2015-09-21 DIAGNOSIS — Z7982 Long term (current) use of aspirin: Secondary | ICD-10-CM | POA: Insufficient documentation

## 2015-09-21 DIAGNOSIS — Z8739 Personal history of other diseases of the musculoskeletal system and connective tissue: Secondary | ICD-10-CM | POA: Insufficient documentation

## 2015-09-21 DIAGNOSIS — I10 Essential (primary) hypertension: Secondary | ICD-10-CM | POA: Diagnosis not present

## 2015-09-21 DIAGNOSIS — Q6100 Congenital renal cyst, unspecified: Secondary | ICD-10-CM | POA: Diagnosis not present

## 2015-09-21 DIAGNOSIS — Z86010 Personal history of colonic polyps: Secondary | ICD-10-CM | POA: Insufficient documentation

## 2015-09-21 DIAGNOSIS — R1084 Generalized abdominal pain: Secondary | ICD-10-CM

## 2015-09-21 DIAGNOSIS — R0989 Other specified symptoms and signs involving the circulatory and respiratory systems: Secondary | ICD-10-CM

## 2015-09-21 LAB — COMPREHENSIVE METABOLIC PANEL
ALT: 19 U/L (ref 14–54)
AST: 26 U/L (ref 15–41)
Albumin: 3.9 g/dL (ref 3.5–5.0)
Alkaline Phosphatase: 35 U/L — ABNORMAL LOW (ref 38–126)
Anion gap: 6 (ref 5–15)
BUN: 26 mg/dL — ABNORMAL HIGH (ref 6–20)
CO2: 28 mmol/L (ref 22–32)
Calcium: 8.9 mg/dL (ref 8.9–10.3)
Chloride: 106 mmol/L (ref 101–111)
Creatinine, Ser: 1.1 mg/dL — ABNORMAL HIGH (ref 0.44–1.00)
GFR calc Af Amer: 48 mL/min — ABNORMAL LOW (ref >60)
GFR calc non Af Amer: 41 mL/min — ABNORMAL LOW (ref >60)
Glucose, Bld: 87 mg/dL (ref 65–99)
Potassium: 3.7 mmol/L (ref 3.5–5.1)
Sodium: 140 mmol/L (ref 135–145)
Total Bilirubin: 0.9 mg/dL (ref 0.3–1.2)
Total Protein: 6.1 g/dL — ABNORMAL LOW (ref 6.5–8.1)

## 2015-09-21 LAB — CBC WITH DIFFERENTIAL/PLATELET
Basophils Absolute: 0 10*3/uL (ref 0.0–0.1)
Basophils Relative: 0 %
Eosinophils Absolute: 0.1 10*3/uL (ref 0.0–0.7)
Eosinophils Relative: 2 %
HCT: 38.8 % (ref 36.0–46.0)
Hemoglobin: 12.9 g/dL (ref 12.0–15.0)
Lymphocytes Relative: 55 %
Lymphs Abs: 3 10*3/uL (ref 0.7–4.0)
MCH: 31.3 pg (ref 26.0–34.0)
MCHC: 33.2 g/dL (ref 30.0–36.0)
MCV: 94.2 fL (ref 78.0–100.0)
Monocytes Absolute: 0.7 10*3/uL (ref 0.1–1.0)
Monocytes Relative: 13 %
Neutro Abs: 1.7 10*3/uL (ref 1.7–7.7)
Neutrophils Relative %: 30 %
Platelets: 161 10*3/uL (ref 150–400)
RBC: 4.12 MIL/uL (ref 3.87–5.11)
RDW: 13.6 % (ref 11.5–15.5)
WBC: 5.6 10*3/uL (ref 4.0–10.5)

## 2015-09-21 LAB — LIPASE, BLOOD: Lipase: 34 U/L (ref 11–51)

## 2015-09-21 MED ORDER — BENZONATATE 100 MG PO CAPS: 100.0000 mg | ORAL_CAPSULE | Freq: Two times a day (BID) | ORAL | Status: DC

## 2015-09-21 MED ORDER — GI COCKTAIL ~~LOC~~
10.0000 mL | Freq: Once | ORAL | Status: AC
  Administered 2015-09-21: 10 mL via ORAL
  Filled 2015-09-21: qty 30

## 2015-09-21 NOTE — ED Notes (Signed)
Pt complains of upper abdominal pain and severe weight loss, she also says that she's coughing up lots of phlegm

## 2015-09-21 NOTE — Discharge Instructions (Signed)

## 2015-10-04 NOTE — ED Provider Notes (Signed)
CSN: [id number]     Arrival date & time 09/21/15  1844 History   First MD Initiated Contact with Patient 09/21/15 2026     Chief Complaint  Patient presents with  . Abdominal Pain     (Consider location/radiation/quality/duration/timing/severity/associated sxs/prior Treatment) HPI   81 year old female presenting with family for evaluation of abdominal pain and cough. Symptoms have been ongoing for months. Pain is across the upper abdomen. Crampy in nature. No appreciable exacerbating relieving factors. No nausea or vomiting. No urinary complaints. Has frequent coughing up white sputum. She denies shortness of breath though. No chest pain. No fevers or chills. No unusual leg pain or swelling.  Past Medical History  Diagnosis Date  . RBBB (right bundle branch block with left anterior fascicular block)   . Hypertension   . Hiatal hernia   . Gastritis, chronic   . Breast cancer ([place])     NO STICK OR BLOOD PRESSURE CHECKS IN LEFT ARM  . Degenerative joint disease   . Anxiety state, unspecified   . Low back pain   . Carpal tunnel syndrome   . Renal cyst   . Diverticula of colon   . Cervical spondylosis   . Diastolic dysfunction   . Venous insufficiency   . Constipation   . Colon polyp 12/19/2009    Transverse-polypoid colorectal mucosa  . Allergic rhinitis, cause unspecified 02/21/2014   Past Surgical History  Procedure Laterality Date  . Left mastectomy    . Carpal tunnel release     Family History  Problem Relation Age of Onset  . Colon cancer Neg Hx    Social History  Substance Use Topics  . Smoking status: Never Smoker   . Smokeless tobacco: Never Used  . Alcohol Use: No   OB History    No data available     Review of Systems  All systems reviewed and negative, other than as noted in HPI.   Allergies  Fentanyl  Home Medications   Prior to Admission medications   Medication Sig Start Date End Date Taking? Authorizing Provider  ALPRAZolam Duanne Moron) 0.5 MG  tablet Take 0.25-0.5 mg by mouth 3 (three) times daily as needed for anxiety or sleep.    Yes Historical Provider, MD  aspirin EC 81 MG tablet Take 81 mg by mouth daily.   Yes Historical Provider, MD  bisacodyl (DULCOLAX) 5 MG EC tablet Take 5 mg by mouth daily as needed for mild constipation or moderate constipation.   Yes Historical Provider, MD  carvedilol (COREG) 3.125 MG tablet Take 1 tablet (3.125 mg total) by mouth 2 (two) times daily. 02/02/14  Yes Biagio Borg, MD  Cholecalciferol (VITAMIN D3) 1000 UNITS CAPS Take 1,000 Units by mouth daily. For your bones   Yes Historical Provider, MD  fluticasone (FLONASE) 50 MCG/ACT nasal spray Place 2 sprays into both nostrils daily. 10/20/14  Yes Biagio Borg, MD  furosemide (LASIX) 40 MG tablet Take 40 mg by mouth daily.    Yes Historical Provider, MD  guaiFENesin (MUCINEX) 600 MG 12 hr tablet Take 600 mg by mouth 2 (two) times daily.   Yes Historical Provider, MD  HYDROcodone-acetaminophen (NORCO/VICODIN) 5-325 MG tablet Take 1 tablet by mouth every 6 (six) hours as needed (pain).   Yes Historical Provider, MD  Multiple Vitamins-Minerals (MULTIVITAMINS THER. W/MINERALS) TABS Take 1 tablet by mouth daily.   Yes Historical Provider, MD  polyethylene glycol (MIRALAX / GLYCOLAX) packet Take 17 g by mouth 2 (two) times daily as  needed for mild constipation.   Yes Historical Provider, MD  ranitidine (ZANTAC) 75 MG tablet Take 1 tablet (75 mg total) by mouth 2 (two) times daily. 05/17/15  Yes Serita Grit, MD  sodium chloride (OCEAN) 0.65 % SOLN nasal spray Place 1-2 sprays into both nostrils daily as needed for congestion.   Yes Historical Provider, MD  benzonatate (TESSALON) 100 MG capsule Take 1 capsule (100 mg total) by mouth 2 (two) times daily. 09/21/15   Virgel Manifold, MD  citalopram (CELEXA) 10 MG tablet Take 1 tablet (10 mg total) by mouth daily. Patient not taking: Reported on 09/21/2015 08/03/14   Biagio Borg, MD  pantoprazole (PROTONIX) 40 MG tablet  Take 1 tablet (40 mg total) by mouth 2 (two) times daily. Patient not taking: Reported on 09/21/2015 09/21/14   Biagio Borg, MD   BP 127/54 mmHg  Pulse 64  Temp(Src) 97.8 F (36.6 C) (Oral)  Resp 16  Wt 116 lb (52.617 kg)  SpO2 100% Physical Exam  Constitutional: She appears well-developed and well-nourished. No distress.  HENT:  Head: Normocephalic and atraumatic.  Eyes: Conjunctivae are normal. Right eye exhibits no discharge. Left eye exhibits no discharge.  Neck: Neck supple.  Cardiovascular: Normal rate, regular rhythm and normal heart sounds.  Exam reveals no gallop and no friction rub.   No murmur heard. Pulmonary/Chest: Effort normal and breath sounds normal. No respiratory distress.  Occasional hacking cough and spitting whitish sputum into a bag.  Abdominal: Soft. She exhibits no distension. There is no tenderness.  Musculoskeletal: She exhibits no edema or tenderness.  Neurological: She is alert.  Skin: Skin is warm and dry.  Psychiatric: She has a normal mood and affect. Her behavior is normal. Thought content normal.  Nursing note and vitals reviewed.   ED Course  Procedures (including critical care time) Labs Review Labs Reviewed  COMPREHENSIVE METABOLIC PANEL - Abnormal; Notable for the following:    BUN 26 (*)    Creatinine, Ser 1.10 (*)    Total Protein 6.1 (*)    Alkaline Phosphatase 35 (*)    GFR calc non Af Amer 41 (*)    GFR calc Af Amer 48 (*)    All other components within normal limits  LIPASE, BLOOD  CBC WITH DIFFERENTIAL/PLATELET    Imaging Review No results found. I have personally reviewed and evaluated these images and lab results as part of my medical decision-making.   EKG Interpretation None      MDM   Final diagnoses:  Generalized abdominal pain  Phlegm in throat    [name] with numerous complaints. Abdominal pain, but with benign exam. Describes symptoms that seem more chronic in nature. I have low suspicion for emergent  process. Further imaging was deferred.    Virgel Manifold, MD 10/04/15 [phone]

## 2015-10-26 ENCOUNTER — Ambulatory Visit: Payer: Medicare Other | Attending: Orthopaedic Surgery | Admitting: Physical Therapy

## 2015-10-26 DIAGNOSIS — R262 Difficulty in walking, not elsewhere classified: Secondary | ICD-10-CM | POA: Diagnosis present

## 2015-10-26 DIAGNOSIS — M5135 Other intervertebral disc degeneration, thoracolumbar region: Secondary | ICD-10-CM | POA: Insufficient documentation

## 2015-10-26 DIAGNOSIS — M47816 Spondylosis without myelopathy or radiculopathy, lumbar region: Secondary | ICD-10-CM

## 2015-10-26 DIAGNOSIS — M4005 Postural kyphosis, thoracolumbar region: Secondary | ICD-10-CM | POA: Diagnosis present

## 2015-10-26 NOTE — Therapy (Signed)
[place], [place], [address] Phone: [phone]   Fax:  [phone]  Physical Therapy Evaluation  Patient Details  Name: Heather [name] MRN: [id number] Date of Birth: March 14, 1918 Referring Provider: Dr. Joni Fears  Encounter Date: 10/26/2015      PT End of Session - 10/26/15 1057    Visit Number 1   Number of Visits 16   Date for PT Re-Evaluation 12/21/15   Authorization Type BCBS Medicare  G codes; KX at 68   PT Start Time 1013   PT Stop Time 1110   PT Time Calculation (min) 57 min   Activity Tolerance Patient tolerated treatment well      Past Medical History  Diagnosis Date  . RBBB (right bundle branch block with left anterior fascicular block)   . Hypertension   . Hiatal hernia   . Gastritis, chronic   . Breast cancer ([place])     NO STICK OR BLOOD PRESSURE CHECKS IN LEFT ARM  . Degenerative joint disease   . Anxiety state, unspecified   . Low back pain   . Carpal tunnel syndrome   . Renal cyst   . Diverticula of colon   . Cervical spondylosis   . Diastolic dysfunction   . Venous insufficiency   . Constipation   . Colon polyp 12/19/2009    Transverse-polypoid colorectal mucosa  . Allergic rhinitis, cause unspecified 02/21/2014    Past Surgical History  Procedure Laterality Date  . Left mastectomy    . Carpal tunnel release      There were no vitals filed for this visit.  Visit Diagnosis:  DDD (degenerative disc disease), thoracolumbar - Plan: PT plan of care cert/re-cert  Lumbar spondylosis, unspecified spinal osteoarthritis - Plan: PT plan of care cert/re-cert  Postural kyphosis of thoracolumbar region - Plan: PT plan of care cert/re-cert  Difficulty in walking - Plan: PT plan of care cert/re-cert      Subjective Assessment - 10/26/15 1017    Subjective Patient complains of back pain while standing and walking.  No pain sitting or lying down.  Present x 1year.  81  years old.  Lives alone.   Son lives nearby.  Does her own housework.     Limitations Standing   How long can you sit comfortably? no problem   How long can you stand comfortably? 5-10 min   How long can you walk comfortably? uses SPC mostly just in community;    Diagnostic tests x-ray   Patient Stated Goals lessen back pain   Currently in Pain? Yes   Pain Score 0-No pain   Pain Location Back   Pain Orientation Lower;Right;Left   Pain Onset More than a month ago   Pain Frequency Intermittent   Aggravating Factors  stand, walking, stand upright   Pain Relieving Factors sitting or lying down            [facility] PT Assessment - 10/26/15 1007    Assessment   Medical Diagnosis DDD,DJD Lumbar spine with degenerative scoliosis   Referring Provider Dr. Joni Fears   Onset Date/Surgical Date --  1 year   Hand Dominance Right   Next MD Visit 3 weeks   Prior Therapy just ex   Precautions   Precautions None   Restrictions   Weight Bearing Restrictions No   Balance Screen   Has the patient fallen in the past 6 months No   Has the patient had a decrease in activity  level because of a fear of falling?  Yes   Is the patient reluctant to leave their home because of a fear of falling?  No   Home Environment   Living Environment Private residence   Living Arrangements Alone   Available Help at Discharge Family   Type of [place] to enter   Entrance Stairs-Number of Steps 3   Entrance Stairs-Rails Right;Left;Can reach both   [place] One level   [place] - single point   Prior Function   Level of Independence Independent with basic ADLs   Vocation Retired   Biomedical scientist retired Quarry manager   Leisure shopping walk around [place], [employer]olic[employer]   Observation/Other Assessments   Focus on Therapeutic Outcomes (FOTO)  59% limited   Posture/Postural Control   Posture/Postural Control Postural limitations   Postural Limitations Rounded  Shoulders;Forward head;Increased thoracic kyphosis;Flexed trunk   ROM / Strength   AROM / PROM / Strength AROM;Strength   AROM   AROM Assessment Site Shoulder   Right/Left Shoulder --  120 degrees B shoulder flex in sit/sup;  standing 95 degrees   Lumbar Flexion --  WFLs   Lumbar Extension 0   Strength   Strength Assessment Site Cervical;Lumbar;Thoracic   Cervical Flexion 3/5   Cervical Extension 3/5   Lumbar Flexion 3/5   Lumbar Extension 3/5   Thoracic Extension 3/5   Ambulation/Gait   Assistive device Straight cane                   [facility] Adult PT Treatment/Exercise - 10/26/15 1007    Moist Heat Therapy   Number Minutes Moist Heat 15 Minutes   Moist Heat Location Lumbar Spine   Electrical Stimulation   Electrical Stimulation Location lumbar/thoracic  seated   Electrical Stimulation Action IFC   Electrical Stimulation Parameters 8 ma   Electrical Stimulation Goals Pain                  PT Short Term Goals - 10/26/15 1125    PT SHORT TERM GOAL #1   Title Patient will be independent with rolling walker for community ambulation for safety and pain control  11/23/15   Time 4   Period Weeks   Status New   PT SHORT TERM GOAL #2   Title The patient will report a 25 % improvement in back pain with walking and standing with housework   Time 4   Period Weeks   Status New           PT Long Term Goals - 10/26/15 1127    PT LONG TERM GOAL #1   Title The patient will be independent in self management of back pain using ex, activity modification, heat   12/20/15   Time 8   Period Weeks   Status New   PT LONG TERM GOAL #2   Title The patient will be able to walk/stand with rolling walker for 15 minutes for grocery shopping   Time 8   Period Weeks   Status New   PT LONG TERM GOAL #3   Title The patient will report an overall improvement in pain at 50% with household chores.   Time 8   Period Weeks   Status New   PT LONG TERM GOAL #4   Title  Patient will have spinal extension to 7 degrees needed to reach into an eye level height cabinet.     Time 8  Period Weeks   Status New   PT LONG TERM GOAL #5   Title FOTO functional outcome score improved from 59% to 42% indicating improved function with less pain   (needs assist to complete)   Time 8   Period Weeks   Status New               Plan - 2015/11/09 1104    Clinical Impression Statement The patient is a pleasant 72 year old with a 1 year history of lower to upper back pain with standing and walking.  She has no pain with sitting or lying down.  She has kyphotic posture with head forward/down, rounded shoulders, flexed trunk.  She walks with a single point cane for short distance community to the store and sometimes at home when her back and right knee is really bothering her.  She denies falls but has reduced her activity in fearfulness of falling.  She would benefit from a rolling walker for safety and for pain control.  She is limited with trunk extension mobility and is unable to reach into her kitchen cabinets.  She would benefit from PT for postural strengthening, gait training with rolling walker and modalties for pain control.     Pt will benefit from skilled therapeutic intervention in order to improve on the following deficits Decreased activity tolerance;Decreased range of motion;Decreased strength;Pain;Postural dysfunction;Difficulty walking   Rehab Potential Good   PT Frequency 2x / week   PT Duration 8 weeks   PT Treatment/Interventions ADLs/Self Care Home Management;Cryotherapy;Electrical Stimulation;Moist Heat;Therapeutic exercise;Patient/family education;Taping;Manual techniques;Ultrasound   PT Next Visit Plan Request order for a rolling walker (2 wheeled vs 4 wheels  with brakes/seat patient wants to think about);  supine or seated postural strengthening; IFC/moist heat in sitting or supine   Recommended Other Services Rolling walker          G-Codes -  Nov 09, 2015 1135    Functional Assessment Tool Used FOTO; clinical judgement   Functional Limitation Mobility: Walking and moving around   Mobility: Walking and Moving Around Current Status [id number]) At least 40 percent but less than 60 percent impaired, limited or restricted   Mobility: Walking and Moving Around Goal Status [phone]) At least 40 percent but less than 60 percent impaired, limited or restricted       Problem List Patient Active Problem List   Diagnosis Date Noted  . Abnormal CT scan of lung 07/16/2015  . Abdominal pain 11/30/2014  . Chronic LLQ pain 11/23/2014  . Esophageal dysmotility 10/27/2014  . Cough 10/21/2014  . Other dysphagia 05/09/2014  . Loss of weight 04/26/2014  . Arthritis of hand, degenerative 04/10/2014  . Other and unspecified hyperlipidemia 04/10/2014  . LPRD (laryngopharyngeal reflux disease) 04/04/2014  . Routine general medical examination at a health care facility 02/21/2014  . Allergic rhinitis 02/21/2014  . Abnormal findings in stool 12/02/2013  . Abdominal pain, left lower quadrant 06/22/2013  . Chronic bronchitis ([place]) 05/30/2013  . Somatization disorder 03/14/2013  . Hx of breast cancer 08/30/2012  . Multiple somatic complaints 06/28/2012  . Rhinitis 05/11/2012  . Edema 06/12/2011  . GERD 03/27/2010  . DIZZINESS 08/31/2009  . Diastolic CHF ([place]) [id number]  . FLANK PAIN, LEFT 03/21/2009  . WEAKNESS 02/21/2009  . CONSTIPATION 01/18/2009  . FLATULENCE-GAS-BLOATING 01/18/2009  . CARPAL TUNNEL SYNDROME, BILATERAL 10/03/2008  . Cervical spondylosis without myelopathy 03/21/2008  . Diaphragmatic hernia 12/26/2007  . RENAL CYST 12/26/2007  . Anxiety state 09/15/2007  . Essential hypertension 09/15/2007  .  VENOUS INSUFFICIENCY 09/15/2007  . DEGENERATIVE JOINT DISEASE 09/15/2007  . LOW BACK PAIN, CHRONIC 09/15/2007  . DIVERTICULOSIS, COLON 05/24/2002    Alvera Singh 10/26/2015, 11:38 AM  [facility] [address] [place], [place], [address] Phone: [phone]   Fax:  [phone]  Name: Heather [name] MRN: [id number] Date of Birth: 1918/04/14

## 2015-11-01 ENCOUNTER — Ambulatory Visit: Payer: Medicare Other | Admitting: Physical Therapy

## 2015-11-01 DIAGNOSIS — M5135 Other intervertebral disc degeneration, thoracolumbar region: Secondary | ICD-10-CM

## 2015-11-01 DIAGNOSIS — M4005 Postural kyphosis, thoracolumbar region: Secondary | ICD-10-CM

## 2015-11-01 DIAGNOSIS — M47816 Spondylosis without myelopathy or radiculopathy, lumbar region: Secondary | ICD-10-CM

## 2015-11-01 DIAGNOSIS — R262 Difficulty in walking, not elsewhere classified: Secondary | ICD-10-CM

## 2015-11-01 NOTE — Therapy (Signed)
[place], [place], [address] Phone: [phone]   Fax:  [phone]  Physical Therapy Treatment  Patient Details  Name: Heather [name] MRN: [id number] Date of Birth: 22-Mar-1918 Referring Provider: Dr. Joni Fears  Encounter Date: 11/01/2015      PT End of Session - 11/01/15 1340    Visit Number 2   Number of Visits 16   Date for PT Re-Evaluation 12/21/15   Authorization Type BCBS Medicare  G codes; KX at 15   PT Start Time 0130   PT Stop Time 0215   PT Time Calculation (min) 45 min      Past Medical History  Diagnosis Date  . RBBB (right bundle branch block with left anterior fascicular block)   . Hypertension   . Hiatal hernia   . Gastritis, chronic   . Breast cancer ([place])     NO STICK OR BLOOD PRESSURE CHECKS IN LEFT ARM  . Degenerative joint disease   . Anxiety state, unspecified   . Low back pain   . Carpal tunnel syndrome   . Renal cyst   . Diverticula of colon   . Cervical spondylosis   . Diastolic dysfunction   . Venous insufficiency   . Constipation   . Colon polyp 12/19/2009    Transverse-polypoid colorectal mucosa  . Allergic rhinitis, cause unspecified 02/21/2014    Past Surgical History  Procedure Laterality Date  . Left mastectomy    . Carpal tunnel release      There were no vitals filed for this visit.  Visit Diagnosis:  DDD (degenerative disc disease), thoracolumbar  Lumbar spondylosis, unspecified spinal osteoarthritis  Postural kyphosis of thoracolumbar region  Difficulty in walking      Subjective Assessment - 11/01/15 1338    Subjective My arthritis is bad today. The heat a estim made me feel bad. I dont need that today.    Currently in Pain? No/denies  not at rest                         [facility] Adult PT Treatment/Exercise - 11/01/15 1358    Lumbar Exercises: Aerobic   Stationary Bike Nustep L2 x 10 min UE/LE   Lumbar  Exercises: Supine   Ab Set 5 reps   AB Set Limitations Pt has been perfroming pelvic floor strengthening exercises at home   Glut Set 10 reps   Clam 10 reps   Bent Knee Raise 10 reps   Other Supine Lumbar Exercises ball squeeze with ab set/pelvic floor x10                  PT Short Term Goals - 11/01/15 1341    PT SHORT TERM GOAL #1   Title Patient will be independent with rolling walker for community ambulation for safety and pain control  11/23/15   Time 4   Period Weeks   Status On-going   PT SHORT TERM GOAL #2   Title The patient will report a 25 % improvement in back pain with walking and standing with housework   Time 4   Period Weeks   Status On-going           PT Long Term Goals - 11/01/15 1341    PT LONG TERM GOAL #1   Title The patient will be independent in self management of back pain using ex, activity modification, heat   12/20/15   Time 8  Period Weeks   Status On-going   PT LONG TERM GOAL #2   Title The patient will be able to walk/stand with rolling walker for 15 minutes for grocery shopping   Time 8   Period Weeks   Status On-going   PT LONG TERM GOAL #3   Title The patient will report an overall improvement in pain at 50% with household chores.   Time 8   Period Weeks   Status On-going   PT LONG TERM GOAL #4   Title Patient will have spinal extension to 7 degrees needed to reach into an eye level height cabinet.     Time 8   Period Weeks   Status On-going   PT LONG TERM GOAL #5   Title FOTO functional outcome score improved from 59% to 42% indicating improved function with less pain   (needs assist to complete)   Time 8   Period Weeks   Status On-going               Plan - 11/01/15 1509    Clinical Impression Statement Pt reports she and her son do not feel she needs a rollator with seat at this time. She has a standard RW at home that a friend gave her just in case she needs it. Instructed pt in supine core and decompression  exercises without increased pain. Pt given prepilates HEP.    PT Next Visit Plan supine or seated postural strengthening; Pt did not like estim, try just heat? Review HEP, continue Nustep        Problem List Patient Active Problem List   Diagnosis Date Noted  . Abnormal CT scan of lung 07/16/2015  . Abdominal pain 11/30/2014  . Chronic LLQ pain 11/23/2014  . Esophageal dysmotility 10/27/2014  . Cough 10/21/2014  . Other dysphagia 05/09/2014  . Loss of weight 04/26/2014  . Arthritis of hand, degenerative 04/10/2014  . Other and unspecified hyperlipidemia 04/10/2014  . LPRD (laryngopharyngeal reflux disease) 04/04/2014  . Routine general medical examination at a health care facility 02/21/2014  . Allergic rhinitis 02/21/2014  . Abnormal findings in stool 12/02/2013  . Abdominal pain, left lower quadrant 06/22/2013  . Chronic bronchitis ([place]) 05/30/2013  . Somatization disorder 03/14/2013  . Hx of breast cancer 08/30/2012  . Multiple somatic complaints 06/28/2012  . Rhinitis 05/11/2012  . Edema 06/12/2011  . GERD 03/27/2010  . DIZZINESS 08/31/2009  . Diastolic CHF ([place]) Q000111Q  . FLANK PAIN, LEFT 03/21/2009  . WEAKNESS 02/21/2009  . CONSTIPATION 01/18/2009  . FLATULENCE-GAS-BLOATING 01/18/2009  . CARPAL TUNNEL SYNDROME, BILATERAL 10/03/2008  . Cervical spondylosis without myelopathy 03/21/2008  . Diaphragmatic hernia 12/26/2007  . RENAL CYST 12/26/2007  . Anxiety state 09/15/2007  . Essential hypertension 09/15/2007  . VENOUS INSUFFICIENCY 09/15/2007  . DEGENERATIVE JOINT DISEASE 09/15/2007  . LOW BACK PAIN, CHRONIC 09/15/2007  . DIVERTICULOSIS, COLON 05/24/2002    Dorene Ar, PTA 11/01/2015, 3:23 PM  [facility] [address] [place], [place], [address] Phone: [phone]   Fax:  [phone]  Name: Heather [name] MRN: [id number] Date of Birth: 03-13-1918

## 2015-11-01 NOTE — Patient Instructions (Signed)
     Isometric Hold With Pelvic Floor (Hook-Lying)  Lie with hips and knees bent. Slowly inhale, and then exhale. Pull navel toward spine and tighten pelvic floor. Hold for __10_ seconds. Continue to breathe in and out during hold. Rest for _10__ seconds. Repeat __10_ times. Do __2-3_ times a day.   Knee Fold  Lie on back, legs bent, arms by sides. Exhale, lifting knee to chest. Inhale, returning. Keep abdominals flat, navel to spine. Repeat __10__ times, alternating legs. Do __2__ sessions per day.  Knee Drop  Keep pelvis stable. Without rotating hips, slowly drop knee to side, pause, return to center. Alternate each leg Repeat for ___10_ times each leg.   Copyright  VHI. All rights reserved.

## 2015-11-07 ENCOUNTER — Ambulatory Visit: Payer: Medicare Other | Admitting: Physical Therapy

## 2015-11-14 ENCOUNTER — Ambulatory Visit: Payer: Medicare Other | Admitting: Physical Therapy

## 2015-11-14 DIAGNOSIS — M47816 Spondylosis without myelopathy or radiculopathy, lumbar region: Secondary | ICD-10-CM

## 2015-11-14 DIAGNOSIS — M5135 Other intervertebral disc degeneration, thoracolumbar region: Secondary | ICD-10-CM

## 2015-11-14 DIAGNOSIS — R262 Difficulty in walking, not elsewhere classified: Secondary | ICD-10-CM

## 2015-11-14 DIAGNOSIS — M4005 Postural kyphosis, thoracolumbar region: Secondary | ICD-10-CM

## 2015-11-14 NOTE — Therapy (Signed)
[place], [place], [address] Phone: [phone]   Fax:  [phone]  Physical Therapy Treatment  Patient Details  Name: Heather [name] MRN: [id number] Date of Birth: Dec 21, 1917 Referring Provider: Dr. Joni Fears  Encounter Date: 11/14/2015      PT End of Session - 11/14/15 1217    Visit Number 3   Number of Visits 16   Date for PT Re-Evaluation 12/21/15   Authorization Type BCBS Medicare  G codes; KX at 15   PT Start Time 1147   PT Stop Time 1230   PT Time Calculation (min) 43 min   Activity Tolerance Patient tolerated treatment well      Past Medical History  Diagnosis Date  . RBBB (right bundle branch block with left anterior fascicular block)   . Hypertension   . Hiatal hernia   . Gastritis, chronic   . Breast cancer ([place])     NO STICK OR BLOOD PRESSURE CHECKS IN LEFT ARM  . Degenerative joint disease   . Anxiety state, unspecified   . Low back pain   . Carpal tunnel syndrome   . Renal cyst   . Diverticula of colon   . Cervical spondylosis   . Diastolic dysfunction   . Venous insufficiency   . Constipation   . Colon polyp 12/19/2009    Transverse-polypoid colorectal mucosa  . Allergic rhinitis, cause unspecified 02/21/2014    Past Surgical History  Procedure Laterality Date  . Left mastectomy    . Carpal tunnel release      There were no vitals filed for this visit.  Visit Diagnosis:  DDD (degenerative disc disease), thoracolumbar  Lumbar spondylosis, unspecified spinal osteoarthritis  Postural kyphosis of thoracolumbar region  Difficulty in walking      Subjective Assessment - 11/14/15 1151    Subjective States her back hurts when she walks.  Presents with SPC.  States she has a RW at home and her back doesn't hurt as much.     Currently in Pain? No/denies   Pain Relieving Factors sitting or lying down                         [facility] Adult PT  Treatment/Exercise - 11/14/15 1154    Ambulation/Gait   Ambulation/Gait Yes   Ambulation Distance (Feet) 300 Feet   Assistive device Rolling walker   Lumbar Exercises: Aerobic   Stationary Bike Nu-Step L3 6 min   Lumbar Exercises: Supine   Ab Set 5 reps   Clam 15 reps;3 seconds   Bent Knee Raise 10 reps   Isometric Hip Flexion 10 reps   Shoulder Exercises: Seated   Extension Strengthening;Both;15 reps;Theraband   Theraband Level (Shoulder Extension) Level 1 (Yellow)  with ab brace   Row Strengthening;Both;15 reps;Theraband   Theraband Level (Shoulder Row) Level 1 (Yellow)  with ab brace   Other Seated Exercises 2# plyo ball shoulder to shoulder 1 min with ab brace                  PT Short Term Goals - 11/14/15 1218    PT SHORT TERM GOAL #1   Title Patient will be independent with rolling walker for community ambulation for safety and pain control  11/23/15   Time 4   Period Weeks   Status Achieved   PT SHORT TERM GOAL #2   Title The patient will report a 25 % improvement in back  pain with walking and standing with housework   Time 4   Period Weeks   Status On-going           PT Long Term Goals - 11/14/15 1218    PT LONG TERM GOAL #1   Title The patient will be independent in self management of back pain using ex, activity modification, heat   12/20/15   Time 8   Period Weeks   Status On-going   PT LONG TERM GOAL #2   Title The patient will be able to walk/stand with rolling walker for 15 minutes for grocery shopping   Time 8   Period Weeks   Status On-going   PT LONG TERM GOAL #3   Title The patient will report an overall improvement in pain at 50% with household chores.   Time 8   Period Weeks   Status On-going   PT LONG TERM GOAL #4   Title Patient will have spinal extension to 7 degrees needed to reach into an eye level height cabinet.     Time 8   Period Weeks   Status On-going   PT LONG TERM GOAL #5   Title FOTO functional outcome score  improved from 59% to 42% indicating improved function with less pain   (needs assist to complete)   Time 8   Period Weeks   Status On-going               Plan - 11/14/15 1244    Clinical Impression Statement The patient reports no pain in sitting or lying but has lower thoracic pain with walking with cane.  With rollling walker she is able to walk more upright and painfree 300 feet.  Therapist closely monitoring for pain, safety and proper technique.   Patient defers moist heat since not painful.     PT Next Visit Plan Gait wth RW to increase walking endurance;  supine, seated core strengthening; Nu-Step; moist heat if needed for pain control        Problem List Patient Active Problem List   Diagnosis Date Noted  . Abnormal CT scan of lung 07/16/2015  . Abdominal pain 11/30/2014  . Chronic LLQ pain 11/23/2014  . Esophageal dysmotility 10/27/2014  . Cough 10/21/2014  . Other dysphagia 05/09/2014  . Loss of weight 04/26/2014  . Arthritis of hand, degenerative 04/10/2014  . Other and unspecified hyperlipidemia 04/10/2014  . LPRD (laryngopharyngeal reflux disease) 04/04/2014  . Routine general medical examination at a health care facility 02/21/2014  . Allergic rhinitis 02/21/2014  . Abnormal findings in stool 12/02/2013  . Abdominal pain, left lower quadrant 06/22/2013  . Chronic bronchitis ([place]) 05/30/2013  . Somatization disorder 03/14/2013  . Hx of breast cancer 08/30/2012  . Multiple somatic complaints 06/28/2012  . Rhinitis 05/11/2012  . Edema 06/12/2011  . GERD 03/27/2010  . DIZZINESS 08/31/2009  . Diastolic CHF ([place]) [id number]  . FLANK PAIN, LEFT 03/21/2009  . WEAKNESS 02/21/2009  . CONSTIPATION 01/18/2009  . FLATULENCE-GAS-BLOATING 01/18/2009  . CARPAL TUNNEL SYNDROME, BILATERAL 10/03/2008  . Cervical spondylosis without myelopathy 03/21/2008  . Diaphragmatic hernia 12/26/2007  . RENAL CYST 12/26/2007  . Anxiety state 09/15/2007  . Essential hypertension  09/15/2007  . VENOUS INSUFFICIENCY 09/15/2007  . DEGENERATIVE JOINT DISEASE 09/15/2007  . LOW BACK PAIN, CHRONIC 09/15/2007  . DIVERTICULOSIS, COLON 05/24/2002    Heather [name] 11/14/2015, 1:03 PM  [facility] [address] [place], [place], [address] Phone: [phone]  Fax:  [phone]  Name: Heather [name] MRN: [id number] Date of Birth: 1918/07/03    Ruben Im, PT 11/14/2015 1:03 PM Phone: [phone] Fax: [phone]

## 2015-11-20 ENCOUNTER — Ambulatory Visit: Payer: Medicare Other | Attending: Orthopaedic Surgery | Admitting: Physical Therapy

## 2015-11-20 DIAGNOSIS — M47816 Spondylosis without myelopathy or radiculopathy, lumbar region: Secondary | ICD-10-CM

## 2015-11-20 DIAGNOSIS — M5135 Other intervertebral disc degeneration, thoracolumbar region: Secondary | ICD-10-CM

## 2015-11-20 DIAGNOSIS — R262 Difficulty in walking, not elsewhere classified: Secondary | ICD-10-CM | POA: Insufficient documentation

## 2015-11-20 DIAGNOSIS — M4005 Postural kyphosis, thoracolumbar region: Secondary | ICD-10-CM | POA: Diagnosis present

## 2015-11-20 NOTE — Therapy (Signed)
[facility] [place], [place], [address] Phone: [phone]   Fax:  [phone]  Physical Therapy Treatment  Patient Details  Name: Heather [name] MRN: [id number] Date of Birth: [birth year] Referring Provider: Dr. Joni Fears  Encounter Date: 11/20/2015      PT End of Session - 11/20/15 1229    Visit Number 4   Number of Visits 16   Date for PT Re-Evaluation 12/21/15   Authorization Type BCBS Medicare  G codes; KX at 66   PT Start Time 1145   PT Stop Time 1230   PT Time Calculation (min) 45 min      Past Medical History  Diagnosis Date  . RBBB (right bundle branch block with left anterior fascicular block)   . Hypertension   . Hiatal hernia   . Gastritis, chronic   . Breast cancer ([place])     NO STICK OR BLOOD PRESSURE CHECKS IN LEFT ARM  . Degenerative joint disease   . Anxiety state, unspecified   . Low back pain   . Carpal tunnel syndrome   . Renal cyst   . Diverticula of colon   . Cervical spondylosis   . Diastolic dysfunction   . Venous insufficiency   . Constipation   . Colon polyp 12/19/2009    Transverse-polypoid colorectal mucosa  . Allergic rhinitis, cause unspecified 02/21/2014    Past Surgical History  Procedure Laterality Date  . Left mastectomy    . Carpal tunnel release      There were no vitals filed for this visit.  Visit Diagnosis:  DDD (degenerative disc disease), thoracolumbar  Lumbar spondylosis, unspecified spinal osteoarthritis  Postural kyphosis of thoracolumbar region  Difficulty in walking                       [facility] Adult PT Treatment/Exercise - 11/20/15 1223    Ambulation/Gait   Ambulation/Gait Yes   Ambulation Distance (Feet) 540 Feet   Assistive device Rolling walker   Gait Comments Cues for trunk extension /posture   Lumbar Exercises: Aerobic   Stationary Bike Nu-Step L3 6 min   Lumbar Exercises: Supine   Clam 15 reps;3 seconds   Bent Knee Raise 10 reps   Bridge 10 reps   Shoulder Exercises: Seated   Extension Strengthening;Both;15 reps;Theraband   Theraband Level (Shoulder Extension) Level 1 (Yellow)  with ab brace   Extension Limitations standing and seated   Row Strengthening;Both;15 reps;Theraband   Theraband Level (Shoulder Row) Level 1 (Yellow)  with ab brace                  PT Short Term Goals - 11/14/15 1218    PT SHORT TERM GOAL #1   Title Patient will be independent with rolling walker for community ambulation for safety and pain control  11/23/15   Time 4   Period Weeks   Status Achieved   PT SHORT TERM GOAL #2   Title The patient will report a 25 % improvement in back pain with walking and standing with housework   Time 4   Period Weeks   Status On-going           PT Long Term Goals - 11/14/15 1218    PT LONG TERM GOAL #1   Title The patient will be independent in self management of back pain using ex, activity modification, heat   12/20/15   Time 8   Period Weeks  Status On-going   PT LONG TERM GOAL #2   Title The patient will be able to walk/stand with rolling walker for 15 minutes for grocery shopping   Time 8   Period Weeks   Status On-going   PT LONG TERM GOAL #3   Title The patient will report an overall improvement in pain at 50% with household chores.   Time 8   Period Weeks   Status On-going   PT LONG TERM GOAL #4   Title Patient will have spinal extension to 7 degrees needed to reach into an eye level height cabinet.     Time 8   Period Weeks   Status On-going   PT LONG TERM GOAL #5   Title FOTO functional outcome score improved from 59% to 42% indicating improved function with less pain   (needs assist to complete)   Time 8   Period Weeks   Status On-going               Plan - 11/20/15 1225    Clinical Impression Statement Pt would like a RW issued from our clinic. The one she has on loan is old and heavy per pt. Will send order to Dr Durward Fortes  and issue her a RW upon signature.    PT Next Visit Plan Gait wth RW to increase walking endurance;  supine, seated core strengthening; Nu-Step; moist heat if needed for pain control; Check for RW order signed back- pt may need shorter RW than what we have? Pt needs to schedule a few more appointments        Problem List Patient Active Problem List   Diagnosis Date Noted  . Abnormal CT scan of lung 07/16/2015  . Abdominal pain 11/30/2014  . Chronic LLQ pain 11/23/2014  . Esophageal dysmotility 10/27/2014  . Cough 10/21/2014  . Other dysphagia 05/09/2014  . Loss of weight 04/26/2014  . Arthritis of hand, degenerative 04/10/2014  . Other and unspecified hyperlipidemia 04/10/2014  . LPRD (laryngopharyngeal reflux disease) 04/04/2014  . Routine general medical examination at a health care facility 02/21/2014  . Allergic rhinitis 02/21/2014  . Abnormal findings in stool 12/02/2013  . Abdominal pain, left lower quadrant 06/22/2013  . Chronic bronchitis ([place]) 05/30/2013  . Somatization disorder 03/14/2013  . Hx of breast cancer 08/30/2012  . Multiple somatic complaints 06/28/2012  . Rhinitis 05/11/2012  . Edema 06/12/2011  . GERD 03/27/2010  . DIZZINESS 08/31/2009  . Diastolic CHF ([place]) Q000111Q  . FLANK PAIN, LEFT 03/21/2009  . WEAKNESS 02/21/2009  . CONSTIPATION 01/18/2009  . FLATULENCE-GAS-BLOATING 01/18/2009  . CARPAL TUNNEL SYNDROME, BILATERAL 10/03/2008  . Cervical spondylosis without myelopathy 03/21/2008  . Diaphragmatic hernia 12/26/2007  . RENAL CYST 12/26/2007  . Anxiety state 09/15/2007  . Essential hypertension 09/15/2007  . VENOUS INSUFFICIENCY 09/15/2007  . DEGENERATIVE JOINT DISEASE 09/15/2007  . LOW BACK PAIN, CHRONIC 09/15/2007  . DIVERTICULOSIS, COLON 05/24/2002    Dorene Ar, PTA 11/20/2015, 2:29 PM  [facility] [address] [place], [place], [address] Phone: [phone]   Fax:   [phone]  Name: ABBY YARBORO MRN: [id number] Date of Birth: 04-20-1918

## 2015-11-22 ENCOUNTER — Ambulatory Visit: Payer: Medicare Other | Admitting: Physical Therapy

## 2015-11-22 ENCOUNTER — Other Ambulatory Visit (HOSPITAL_COMMUNITY): Payer: Self-pay | Admitting: Gastroenterology

## 2015-11-22 DIAGNOSIS — M47816 Spondylosis without myelopathy or radiculopathy, lumbar region: Secondary | ICD-10-CM

## 2015-11-22 DIAGNOSIS — M5135 Other intervertebral disc degeneration, thoracolumbar region: Secondary | ICD-10-CM | POA: Diagnosis not present

## 2015-11-22 DIAGNOSIS — M4005 Postural kyphosis, thoracolumbar region: Secondary | ICD-10-CM

## 2015-11-22 DIAGNOSIS — R262 Difficulty in walking, not elsewhere classified: Secondary | ICD-10-CM

## 2015-11-22 DIAGNOSIS — R1310 Dysphagia, unspecified: Secondary | ICD-10-CM

## 2015-11-22 NOTE — Therapy (Signed)
[place], [place], [address] Phone: [phone]   Fax:  [phone]  Physical Therapy Treatment  Patient Details  Name: Heather [name] MRN: [id number] Date of Birth: 08-25-1918 Referring Provider: Dr. Joni Fears  Encounter Date: 11/22/2015      PT End of Session - 11/22/15 1321    Visit Number 5   Number of Visits 16   Date for PT Re-Evaluation 12/21/15   PT Start Time [id number]   PT Stop Time 1248   PT Time Calculation (min) 60 min   Activity Tolerance Patient tolerated treatment well   Behavior During Therapy [facility] for tasks assessed/performed      Past Medical History  Diagnosis Date  . RBBB (right bundle branch block with left anterior fascicular block)   . Hypertension   . Hiatal hernia   . Gastritis, chronic   . Breast cancer ([place])     NO STICK OR BLOOD PRESSURE CHECKS IN LEFT ARM  . Degenerative joint disease   . Anxiety state, unspecified   . Low back pain   . Carpal tunnel syndrome   . Renal cyst   . Diverticula of colon   . Cervical spondylosis   . Diastolic dysfunction   . Venous insufficiency   . Constipation   . Colon polyp 12/19/2009    Transverse-polypoid colorectal mucosa  . Allergic rhinitis, cause unspecified 02/21/2014    Past Surgical History  Procedure Laterality Date  . Left mastectomy    . Carpal tunnel release      There were no vitals filed for this visit.  Visit Diagnosis:  DDD (degenerative disc disease), thoracolumbar  Lumbar spondylosis, unspecified spinal osteoarthritis  Postural kyphosis of thoracolumbar region  Difficulty in walking      Subjective Assessment - 11/22/15 1156    Subjective 9/10 back with walking,  No pain sitting   Currently in Pain? No/denies   Pain Score 8    Pain Location Back   Pain Orientation Right;Left;Lower   Pain Frequency Intermittent   Aggravating Factors  walking   Pain Relieving Factors sitting or lying down.                            [employer] Adult PT Treatment/Exercise - 11/22/15 1212    Ambulation/Gait   Ambulation/Gait Yes   Ambulation Distance (Feet) --  2 laps inside building, Front wheeled walker   Gait Comments cues   Lumbar Exercises: Supine   Glut Set 10 reps   Clam 15 reps   Bent Knee Raise 10 reps  back feels better, with abdominal sets   Other Supine Lumbar Exercises Leg lengthener with 2 pillows 5 X each   Other Supine Lumbar Exercises leg press 10 X   Moist Heat Therapy   Number Minutes Moist Heat 15 Minutes   Moist Heat Location Lumbar Spine                  PT Short Term Goals - 11/22/15 1323    PT SHORT TERM GOAL #1   Time 4   Period Weeks   Status Achieved   PT SHORT TERM GOAL #2   Title The patient will report a 25 % improvement in back pain with walking and standing with housework   Baseline 50% better with use of walker   Time 4   Period Weeks   Status On-going  PT Long Term Goals - 11/14/15 1218    PT LONG TERM GOAL #1   Title The patient will be independent in self management of back pain using ex, activity modification, heat   12/20/15   Time 8   Period Weeks   Status On-going   PT LONG TERM GOAL #2   Title The patient will be able to walk/stand with rolling walker for 15 minutes for grocery shopping   Time 8   Period Weeks   Status On-going   PT LONG TERM GOAL #3   Title The patient will report an overall improvement in pain at 50% with household chores.   Time 8   Period Weeks   Status On-going   PT LONG TERM GOAL #4   Title Patient will have spinal extension to 7 degrees needed to reach into an eye level height cabinet.     Time 8   Period Weeks   Status On-going   PT LONG TERM GOAL #5   Title FOTO functional outcome score improved from 59% to 42% indicating improved function with less pain   (needs assist to complete)   Time 8   Period Weeks   Status On-going               Plan - 11/22/15  1322    Clinical Impression Statement Continue to await order for walker.  Pain decreases 50% with walker's use.  Patient declined the use of E-Stim for pain control saying it made it feel  worse.   PT Next Visit Plan Gait wth RW to increase walking endurance;  supine, seated core strengthening; Nu-Step; moist heat if needed for pain control; Check for RW order signed back- pt may need shorter RW than what we have?   Consulted and Agree with Plan of Care Patient        Problem List Patient Active Problem List   Diagnosis Date Noted  . Abnormal CT scan of lung 07/16/2015  . Abdominal pain 11/30/2014  . Chronic LLQ pain 11/23/2014  . Esophageal dysmotility 10/27/2014  . Cough 10/21/2014  . Other dysphagia 05/09/2014  . Loss of weight 04/26/2014  . Arthritis of hand, degenerative 04/10/2014  . Other and unspecified hyperlipidemia 04/10/2014  . LPRD (laryngopharyngeal reflux disease) 04/04/2014  . Routine general medical examination at a health care facility 02/21/2014  . Allergic rhinitis 02/21/2014  . Abnormal findings in stool 12/02/2013  . Abdominal pain, left lower quadrant 06/22/2013  . Chronic bronchitis ([place]) 05/30/2013  . Somatization disorder 03/14/2013  . Hx of breast cancer 08/30/2012  . Multiple somatic complaints 06/28/2012  . Rhinitis 05/11/2012  . Edema 06/12/2011  . GERD 03/27/2010  . DIZZINESS 08/31/2009  . Diastolic CHF ([place]) [id number]  . FLANK PAIN, LEFT 03/21/2009  . WEAKNESS 02/21/2009  . CONSTIPATION 01/18/2009  . FLATULENCE-GAS-BLOATING 01/18/2009  . CARPAL TUNNEL SYNDROME, BILATERAL 10/03/2008  . Cervical spondylosis without myelopathy 03/21/2008  . Diaphragmatic hernia 12/26/2007  . RENAL CYST 12/26/2007  . Anxiety state 09/15/2007  . Essential hypertension 09/15/2007  . VENOUS INSUFFICIENCY 09/15/2007  . DEGENERATIVE JOINT DISEASE 09/15/2007  . LOW BACK PAIN, CHRONIC 09/15/2007  . DIVERTICULOSIS, COLON 05/24/2002    Dulski,Iyani Dresner 11/22/2015,  1:25 PM  [facility] [address] [place], [place], [address] Phone: [phone]   Fax:  [phone]  Name: Heather [name] MRN: [id number] Date of Birth: [birth year]    Caressa Herzberg, PTA 11/22/2015 1:25 PM Phone: [phone] Fax: [phone]

## 2015-11-30 ENCOUNTER — Encounter (HOSPITAL_COMMUNITY): Payer: Self-pay

## 2015-11-30 ENCOUNTER — Ambulatory Visit (HOSPITAL_COMMUNITY)
Admission: RE | Admit: 2015-11-30 | Discharge: 2015-11-30 | Disposition: A | Discharge status: Home / Self Care | Payer: Medicare Other | Source: Ambulatory Visit | Attending: Gastroenterology | Admitting: Gastroenterology

## 2015-11-30 DIAGNOSIS — R1310 Dysphagia, unspecified: Secondary | ICD-10-CM

## 2015-11-30 DIAGNOSIS — K224 Dyskinesia of esophagus: Secondary | ICD-10-CM | POA: Insufficient documentation

## 2015-11-30 DIAGNOSIS — R634 Abnormal weight loss: Secondary | ICD-10-CM | POA: Insufficient documentation

## 2015-12-05 ENCOUNTER — Other Ambulatory Visit: Payer: Self-pay | Admitting: Nurse Practitioner

## 2015-12-05 DIAGNOSIS — R109 Unspecified abdominal pain: Secondary | ICD-10-CM

## 2015-12-06 ENCOUNTER — Ambulatory Visit
Admission: RE | Admit: 2015-12-06 | Discharge: 2015-12-06 | Disposition: A | Discharge status: Home / Self Care | Payer: Medicare Other | Source: Ambulatory Visit | Attending: Nurse Practitioner | Admitting: Nurse Practitioner

## 2015-12-06 DIAGNOSIS — R109 Unspecified abdominal pain: Secondary | ICD-10-CM

## 2015-12-11 ENCOUNTER — Ambulatory Visit
Admission: RE | Admit: 2015-12-11 | Discharge: 2015-12-11 | Disposition: A | Discharge status: Home / Self Care | Payer: Medicare Other | Source: Ambulatory Visit | Attending: Internal Medicine | Admitting: Internal Medicine

## 2015-12-11 ENCOUNTER — Other Ambulatory Visit: Payer: Self-pay | Admitting: Internal Medicine

## 2015-12-11 DIAGNOSIS — R160 Hepatomegaly, not elsewhere classified: Secondary | ICD-10-CM

## 2015-12-11 MED ORDER — GADOBENATE DIMEGLUMINE 529 MG/ML IV SOLN
11.0000 mL | Freq: Once | INTRAVENOUS | Status: AC | PRN
  Administered 2015-12-11: 11 mL via INTRAVENOUS

## 2015-12-21 DIAGNOSIS — R7309 Other abnormal glucose: Secondary | ICD-10-CM | POA: Diagnosis not present

## 2015-12-21 DIAGNOSIS — F419 Anxiety disorder, unspecified: Secondary | ICD-10-CM | POA: Diagnosis not present

## 2015-12-21 DIAGNOSIS — I10 Essential (primary) hypertension: Secondary | ICD-10-CM | POA: Diagnosis not present

## 2015-12-21 DIAGNOSIS — E46 Unspecified protein-calorie malnutrition: Secondary | ICD-10-CM | POA: Diagnosis not present

## 2015-12-21 DIAGNOSIS — I700 Atherosclerosis of aorta: Secondary | ICD-10-CM | POA: Diagnosis not present

## 2015-12-21 DIAGNOSIS — C50912 Malignant neoplasm of unspecified site of left female breast: Secondary | ICD-10-CM | POA: Diagnosis not present

## 2015-12-21 DIAGNOSIS — J310 Chronic rhinitis: Secondary | ICD-10-CM | POA: Diagnosis not present

## 2015-12-21 DIAGNOSIS — N183 Chronic kidney disease, stage 3 (moderate): Secondary | ICD-10-CM | POA: Diagnosis not present

## 2015-12-21 DIAGNOSIS — R0982 Postnasal drip: Secondary | ICD-10-CM | POA: Diagnosis not present

## 2015-12-21 DIAGNOSIS — K219 Gastro-esophageal reflux disease without esophagitis: Secondary | ICD-10-CM | POA: Diagnosis not present

## 2016-01-01 DIAGNOSIS — M545 Low back pain: Secondary | ICD-10-CM | POA: Diagnosis not present

## 2016-01-03 DIAGNOSIS — R1310 Dysphagia, unspecified: Secondary | ICD-10-CM | POA: Diagnosis not present

## 2016-01-08 ENCOUNTER — Ambulatory Visit: Payer: Medicare Other | Admitting: Physical Therapy

## 2016-01-14 ENCOUNTER — Ambulatory Visit: Payer: Medicare Other | Attending: Orthopaedic Surgery | Admitting: Physical Therapy

## 2016-01-14 DIAGNOSIS — R262 Difficulty in walking, not elsewhere classified: Secondary | ICD-10-CM | POA: Insufficient documentation

## 2016-01-14 DIAGNOSIS — M47816 Spondylosis without myelopathy or radiculopathy, lumbar region: Secondary | ICD-10-CM | POA: Insufficient documentation

## 2016-01-14 DIAGNOSIS — M5135 Other intervertebral disc degeneration, thoracolumbar region: Secondary | ICD-10-CM | POA: Insufficient documentation

## 2016-01-14 DIAGNOSIS — M4005 Postural kyphosis, thoracolumbar region: Secondary | ICD-10-CM | POA: Diagnosis not present

## 2016-01-14 NOTE — Therapy (Signed)
[place], [place], [address] Phone: [phone]   Fax:  [phone]  Physical Therapy Treatment / Renewal   Patient Details  Name: Heather [name] MRN: [id number] Date of Birth: [birth year] Referring Provider: Dr. Joni Fears  Encounter Date: 01/14/2016      PT End of Session - 01/14/16 1438    Visit Number 6   Number of Visits 9   Date for PT Re-Evaluation 02/11/16   Authorization Type BCBS Medicare  G codes; KX at 25   PT Start Time 1330   PT Stop Time 1418   PT Time Calculation (min) 48 min   Activity Tolerance Patient tolerated treatment well   Behavior During Therapy [facility] for tasks assessed/performed      Past Medical History  Diagnosis Date  . RBBB (right bundle branch block with left anterior fascicular block)   . Hypertension   . Hiatal hernia   . Gastritis, chronic   . Breast cancer ([place])     NO STICK OR BLOOD PRESSURE CHECKS IN LEFT ARM  . Degenerative joint disease   . Anxiety state, unspecified   . Low back pain   . Carpal tunnel syndrome   . Renal cyst   . Diverticula of colon   . Cervical spondylosis   . Diastolic dysfunction   . Venous insufficiency   . Constipation   . Colon polyp 12/19/2009    Transverse-polypoid colorectal mucosa  . Allergic rhinitis, cause unspecified 02/21/2014    Past Surgical History  Procedure Laterality Date  . Left mastectomy    . Carpal tunnel release      There were no vitals filed for this visit.  Visit Diagnosis:  DDD (degenerative disc disease), thoracolumbar - Plan: PT plan of care cert/re-cert  Lumbar spondylosis, unspecified spinal osteoarthritis - Plan: PT plan of care cert/re-cert  Postural kyphosis of thoracolumbar region - Plan: PT plan of care cert/re-cert  Difficulty in walking - Plan: PT plan of care cert/re-cert      Subjective Assessment - 01/14/16 1339    Subjective "The back pain is still giving me alot of  problem" she reports planning on getting her rollator today after.   Currently in Pain? Yes   Pain Score 9    Pain Location Back   Pain Orientation Right;Left   Pain Descriptors / Indicators Tightness  pulling   Pain Type Chronic pain   Pain Onset More than a month ago   Pain Frequency Constant   Aggravating Factors  walking/ standing   Pain Relieving Factors sitting down/ lying down            [facility] PT Assessment - 01/14/16 1357    Observation/Other Assessments   Focus on Therapeutic Outcomes (FOTO)  56% limited   AROM   Lumbar Flexion 60   Lumbar Extension 8   Strength   Right Hip Flexion 3+/5   Right Hip Extension 3+/5   Right Hip ABduction 3+/5   Right Hip ADduction 3+/5   Left Hip Flexion 3+/5   Left Hip Extension 3+/5   Left Hip ABduction 3+/5   Left Hip ADduction 3+/5   Right Knee Flexion 4-/5   Right Knee Extension 4/5   Left Knee Flexion 4-/5   Left Knee Extension 4-/5   Ambulation/Gait   Assistive device Straight cane   Gait Pattern Step-to pattern;Antalgic;Trunk flexed;Narrow base of support  [facility] Adult PT Treatment/Exercise - 01/14/16 1441    Self-Care   Self-Care Other Self-Care Comments   Other Self-Care Comments  proper mechanics of going from supine<>sit to avoid torquing the back.   Lumbar Exercises: Stretches   Lower Trunk Rotation 3 reps;30 seconds   Lumbar Exercises: Supine   Bent Knee Raise 10 reps;1 second                PT Education - 01/14/16 1503    Education provided Yes   Education Details updated HEP   Person(s) Educated Patient   Methods Explanation   Comprehension Verbalized understanding          PT Short Term Goals - 01/14/16 1513    PT SHORT TERM GOAL #1   Title Patient will be independent with rolling walker for community ambulation for safety and pain control  11/23/15   Time 4   Period Weeks   Status Achieved   PT SHORT TERM GOAL #2   Title The patient will report a 25 %  improvement in back pain with walking and standing with housework   Time 4   Period Weeks   Status On-going           PT Long Term Goals - 01/14/16 1513    PT LONG TERM GOAL #1   Title The patient will be independent in self management of back pain using ex, activity modification, heat   12/20/15   Time 8   Period Weeks   Status On-going   PT LONG TERM GOAL #2   Title The patient will be able to walk/stand with rolling walker for 15 minutes for grocery shopping   Time 8   Period Weeks   Status On-going   PT LONG TERM GOAL #3   Title The patient will report an overall improvement in pain at 50% with household chores.   Time 8   Period Weeks   Status On-going   PT LONG TERM GOAL #4   Title Patient will have spinal extension to 7 degrees needed to reach into an eye level height cabinet.     Time 8   Period Weeks   Status On-going   PT LONG TERM GOAL #5   Title FOTO functional outcome score improved from 59% to 42% indicating improved function with less pain   (needs assist to complete)   Time 8   Period Weeks   Status On-going               Plan - 01/14/16 1403    Clinical Impression Statement Heather [name] continues to demonstrate limited trunk mobility and pain in the low back. She reports that she has been consistent with her HEP but when quizzed on what she has been doing she is unable to explain or demonstrate the exercises. She reports planning on getting her RW with a seat today after therapy, she is currenlty ambulating with a SPC and reports not feeling as comfortable with it. plan to work with pt with her new RW and assist with comfort and safety and progress HEP. No new goals met today.    Pt will benefit from skilled therapeutic intervention in order to improve on the following deficits Decreased activity tolerance;Decreased range of motion;Decreased strength;Pain;Postural dysfunction;Difficulty walking   Rehab Potential Good   PT Frequency 1x / week   PT Duration  3 weeks   PT Treatment/Interventions ADLs/Self Care Home Management;Cryotherapy;Electrical Stimulation;Moist Heat;Therapeutic exercise;Patient/family education;Taping;Manual techniques;Ultrasound   PT Next Visit Plan  gait with RW with seat, core strengthening, mechanics of going from supine<>sit. Nu-step, MHP for pain PRN,    PT Home Exercise Plan LTR, Supine marching   Consulted and Agree with Plan of Care Patient          G-Codes - January 23, 2016 1514    Functional Assessment Tool Used FOTO; clinical judgement   Functional Limitation Mobility: Walking and moving around   Mobility: Walking and Moving Around Current Status ([id number]) At least 40 percent but less than 60 percent impaired, limited or restricted   Mobility: Walking and Moving Around Goal Status [phone]) At least 40 percent but less than 60 percent impaired, limited or restricted      Problem List Patient Active Problem List   Diagnosis Date Noted  . Abnormal CT scan of lung 07/16/2015  . Abdominal pain 11/30/2014  . Chronic LLQ pain 11/23/2014  . Esophageal dysmotility 10/27/2014  . Cough 10/21/2014  . Other dysphagia 05/09/2014  . Loss of weight 04/26/2014  . Arthritis of hand, degenerative 04/10/2014  . Other and unspecified hyperlipidemia 04/10/2014  . LPRD (laryngopharyngeal reflux disease) 04/04/2014  . Routine general medical examination at a health care facility 02/21/2014  . Allergic rhinitis 02/21/2014  . Abnormal findings in stool 12/02/2013  . Abdominal pain, left lower quadrant 06/22/2013  . Chronic bronchitis ([place]) 05/30/2013  . Somatization disorder 03/14/2013  . Hx of breast cancer 08/30/2012  . Multiple somatic complaints 06/28/2012  . Rhinitis 05/11/2012  . Edema 06/12/2011  . GERD 03/27/2010  . DIZZINESS 08/31/2009  . Diastolic CHF ([place]) 94/58/5929  . FLANK PAIN, LEFT 03/21/2009  . WEAKNESS 02/21/2009  . CONSTIPATION 01/18/2009  . FLATULENCE-GAS-BLOATING 01/18/2009  . CARPAL TUNNEL SYNDROME,  BILATERAL 10/03/2008  . Cervical spondylosis without myelopathy 03/21/2008  . Diaphragmatic hernia 12/26/2007  . RENAL CYST 12/26/2007  . Anxiety state 09/15/2007  . Essential hypertension 09/15/2007  . VENOUS INSUFFICIENCY 09/15/2007  . DEGENERATIVE JOINT DISEASE 09/15/2007  . LOW BACK PAIN, CHRONIC 09/15/2007  . DIVERTICULOSIS, COLON 05/24/2002   Starr Lake PT, DPT, LAT, ATC  23-Jan-2016  3:23 PM     [facility] [address] [place], [place], [address] Phone: [phone]   Fax:  [phone]  Name: Heather [name] MRN: [id number] Date of Birth: 12-24-1917

## 2016-01-16 ENCOUNTER — Ambulatory Visit: Payer: Medicare Other | Attending: Orthopaedic Surgery | Admitting: Physical Therapy

## 2016-01-16 DIAGNOSIS — M47816 Spondylosis without myelopathy or radiculopathy, lumbar region: Secondary | ICD-10-CM

## 2016-01-16 DIAGNOSIS — R262 Difficulty in walking, not elsewhere classified: Secondary | ICD-10-CM | POA: Diagnosis not present

## 2016-01-16 DIAGNOSIS — M4005 Postural kyphosis, thoracolumbar region: Secondary | ICD-10-CM | POA: Diagnosis not present

## 2016-01-16 DIAGNOSIS — M5135 Other intervertebral disc degeneration, thoracolumbar region: Secondary | ICD-10-CM | POA: Insufficient documentation

## 2016-01-16 NOTE — Therapy (Addendum)
[place], [place], [address] Phone: [phone]   Fax:  [phone]  Physical Therapy Treatment  Patient Details  Name: Heather [name] MRN: [id number] Date of Birth: [birth year] Referring Provider: Dr. Joni Fears  Encounter Date: 01/16/2016      PT End of Session - 01/16/16 1033    Visit Number 7   Number of Visits 9   Date for PT Re-Evaluation 02/11/16   PT Start Time 1028   PT Stop Time 1115   PT Time Calculation (min) 47 min   Activity Tolerance Patient tolerated treatment well   Behavior During Therapy [facility] for tasks assessed/performed      Past Medical History  Diagnosis Date  . RBBB (right bundle branch block with left anterior fascicular block)   . Hypertension   . Hiatal hernia   . Gastritis, chronic   . Breast cancer ([place])     NO STICK OR BLOOD PRESSURE CHECKS IN LEFT ARM  . Degenerative joint disease   . Anxiety state, unspecified   . Low back pain   . Carpal tunnel syndrome   . Renal cyst   . Diverticula of colon   . Cervical spondylosis   . Diastolic dysfunction   . Venous insufficiency   . Constipation   . Colon polyp 12/19/2009    Transverse-polypoid colorectal mucosa  . Allergic rhinitis, cause unspecified 02/21/2014    Past Surgical History  Procedure Laterality Date  . Left mastectomy    . Carpal tunnel release      There were no vitals filed for this visit.  Visit Diagnosis:  DDD (degenerative disc disease), thoracolumbar  Lumbar spondylosis, unspecified spinal osteoarthritis  Postural kyphosis of thoracolumbar region  Difficulty in walking      Subjective Assessment - 01/16/16 1033    Subjective Got her rollator but did not bring it today.  Pt arr almost 15 min late. My back is killing me. My doctors arent helping.    Currently in Pain? Yes   Pain Score 9    Pain Location Back  no pain in supine and sidelying    Pain Orientation Right;Left;Lower   Pain Descriptors / Indicators Tightness;Aching;Sore   Pain Type Chronic pain   Pain Onset More than a month ago   Pain Frequency Intermittent   Aggravating Factors  standing, walking    Pain Relieving Factors lying down, heat             [facility] Adult PT Treatment/Exercise - 01/16/16 1041    Self-Care   Other Self-Care Comments  safe transfers, log rolling    Therapeutic Activites    Therapeutic Activities ADL's   ADL's rolling and bed mobility with mod verbal cues to preserve back    Lumbar Exercises: Stretches   Passive Hamstring Stretch 2 reps;30 seconds   Single Knee to Chest Stretch 3 reps;30 seconds   Lower Trunk Rotation 5 reps;10 seconds   Lumbar Exercises: Supine   Ab Set 10 reps   Glut Set 10 reps   Clam 15 reps   Bent Knee Raise 10 reps   Other Supine Lumbar Exercises --   Other Supine Lumbar Exercises leg press 10 X 5 sec each                 PT Education - 01/16/16 1226    Education provided Yes   Education Details bring rollator walker, HEP   Person(s) Educated Patient   Methods Explanation  Comprehension Verbalized understanding          PT Short Term Goals - 01/16/16 1047    PT SHORT TERM GOAL #1   Title Patient will be independent with rolling walker for community ambulation for safety and pain control  11/23/15   Status Achieved   PT SHORT TERM GOAL #2   Title The patient will report a 25 % improvement in back pain with walking and standing with housework   Status On-going           PT Long Term Goals - 01/16/16 1047    PT LONG TERM GOAL #1   Title The patient will be independent in self management of back pain using ex, activity modification, heat   12/20/15   Status On-going   PT LONG TERM GOAL #2   Title The patient will be able to walk/stand with rolling walker for 15 minutes for grocery shopping   Status On-going   PT LONG TERM GOAL #3   Title The patient will report an overall improvement in pain at 50% with household chores.    Status On-going   PT LONG TERM GOAL #4   Title Patient will have spinal extension to 7 degrees needed to reach into an eye level height cabinet.     Status On-going   PT LONG TERM GOAL #5   Title FOTO functional outcome score improved from 59% to 42% indicating improved function with less pain   (needs assist to complete)   Status On-going               G-Codes - 2016/07/16 1010    Functional Assessment Tool Used FOTO; clinical judgement   Functional Limitation Mobility: Walking and moving around   Mobility: Walking and Moving Around Current Status ([id number]) At least 40 percent but less than 60 percent impaired, limited or restricted   Mobility: Walking and Moving Around Goal Status [phone]) At least 40 percent but less than 60 percent impaired, limited or restricted   Mobility: Walking and Moving Around Discharge Status [phone]) At least 40 percent but less than 60 percent impaired, limited or restricted          Plan - 01/16/16 1047    Clinical Impression Statement Patient limited by pain, did not bring her Rollator walker today.  Reinforced the need to use it in case she needs to sit, safety.  She has bilateral LE swelling today, wearing compression hose.  Reviewed supine to sit and again to supine but pt with poor carry over.     PT Next Visit Plan gait with RW with seat, core strengthening, mechanics of going from supine<>sit. Nu-step, MHP for pain PRN, give hamstring stretch   PT Home Exercise Plan LTR, Supine marching, clam , marching   Consulted and Agree with Plan of Care Patient        Problem List Patient Active Problem List   Diagnosis Date Noted  . Abnormal CT scan of lung 07/16/2015  . Abdominal pain 11/30/2014  . Chronic LLQ pain 11/23/2014  . Esophageal dysmotility 10/27/2014  . Cough 10/21/2014  . Other dysphagia 05/09/2014  . Loss of weight 04/26/2014  . Arthritis of hand, degenerative 04/10/2014  . Other and unspecified hyperlipidemia 04/10/2014  .  LPRD (laryngopharyngeal reflux disease) 04/04/2014  . Routine general medical examination at a health care facility 02/21/2014  . Allergic rhinitis 02/21/2014  . Abnormal findings in stool 12/02/2013  . Abdominal pain, left lower quadrant 06/22/2013  . Chronic bronchitis (  [place]) 05/30/2013  . Somatization disorder 03/14/2013  . Hx of breast cancer 08/30/2012  . Multiple somatic complaints 06/28/2012  . Rhinitis 05/11/2012  . Edema 06/12/2011  . GERD 03/27/2010  . DIZZINESS 08/31/2009  . Diastolic CHF ([place]) 54/62/7035  . FLANK PAIN, LEFT 03/21/2009  . WEAKNESS 02/21/2009  . CONSTIPATION 01/18/2009  . FLATULENCE-GAS-BLOATING 01/18/2009  . CARPAL TUNNEL SYNDROME, BILATERAL 10/03/2008  . Cervical spondylosis without myelopathy 03/21/2008  . Diaphragmatic hernia 12/26/2007  . RENAL CYST 12/26/2007  . Anxiety state 09/15/2007  . Essential hypertension 09/15/2007  . VENOUS INSUFFICIENCY 09/15/2007  . DEGENERATIVE JOINT DISEASE 09/15/2007  . LOW BACK PAIN, CHRONIC 09/15/2007  . DIVERTICULOSIS, COLON 05/24/2002    Heather [name] 01/16/2016, 12:36 PM  [facility] [address] [place], [place], [address] Phone: [phone]   Fax:  [phone]  Name: Heather [name] MRN: [id number] Date of Birth: May 10, 1918    Raeford Razor, PT 01/16/2016 12:36 PM Phone: [phone] Fax: [phone]   PHYSICAL THERAPY DISCHARGE SUMMARY  Visits from Start of Care: 7  Current functional level related to goals / functional outcomes: See above    Remaining deficits: Unknown   Education / Equipment: HEP, safety, gait Plan: Patient agrees to discharge.  Patient goals were not met. Patient is being discharged due to not returning since the last visit.  ?????    Raeford Razor, PT 07/08/16 10:13 AM Phone: [phone] Fax: [phone]

## 2016-01-17 ENCOUNTER — Encounter: Payer: Self-pay | Admitting: Pulmonary Disease

## 2016-01-17 ENCOUNTER — Ambulatory Visit: Payer: Medicare Other | Admitting: Pulmonary Disease

## 2016-01-17 VITALS — BP 106/56 | HR 69 | Temp 97.2°F | Ht 62.0 in | Wt 124.4 lb

## 2016-01-17 DIAGNOSIS — K449 Diaphragmatic hernia without obstruction or gangrene: Secondary | ICD-10-CM

## 2016-01-17 DIAGNOSIS — J410 Simple chronic bronchitis: Secondary | ICD-10-CM

## 2016-01-17 DIAGNOSIS — R6889 Other general symptoms and signs: Secondary | ICD-10-CM

## 2016-01-17 DIAGNOSIS — R918 Other nonspecific abnormal finding of lung field: Secondary | ICD-10-CM

## 2016-01-17 DIAGNOSIS — J387 Other diseases of larynx: Secondary | ICD-10-CM | POA: Diagnosis not present

## 2016-01-17 DIAGNOSIS — K224 Dyskinesia of esophagus: Secondary | ICD-10-CM

## 2016-01-17 DIAGNOSIS — K219 Gastro-esophageal reflux disease without esophagitis: Secondary | ICD-10-CM

## 2016-01-17 NOTE — Patient Instructions (Signed)
Today we updated your med list in our EPIC system...    Continue your current medications the same...  Remember to eliminate salt/ sodium...    Elevate your legs...    Wear the support hose...    Take the LASIX (Furosemide) 40mg  one tab each AM...  Continue your nutrtitional supplements...  Let's plan a follow up visit in 93mo, sooner if needed for problems.Marland KitchenMarland Kitchen

## 2016-01-17 NOTE — Progress Notes (Signed)
Subjective:    Patient ID: Heather [name], female    DOB: September 07, 1918, 82 y.o.   MRN: [id number]  HPI  81 y/o BF here for a follow up visit... she has multiple medical problems including:  Dyspnea & Anxiety;  HBP;  Hx CP followed by Heather [name];  HH/ Gastritis;  Divertics/ Constip;  Functional Abd Pain;  Renal cyst followed by Heather [name];  Hx left breast [phone];  DJD/ CSpine spondy/ LBP/ CTS; and she has a cancer phobia...   ~  SEE PREV EPIC NOTES FOR OLDER DATA >> She has established Primary Care follow up w/ Heather [name] => Heather [name], Heather [name]...   CXR 10/14 showed normal heart size w/ tortuous atherosclerotic Ao, clear lungs w/ ?vague opac left mid zone adjacent to hilum (nothing seen in this area on CT 3/14) ?overlying shadows & we will f/u...  Ambulatory O2 check> O2 sat= 98% on room air at rest w/ pulse=83, regular; Ambulated 3 laps w/ lowest O2 sat= 96% w/ pulse=108...  LABS 10/14:  Chems- wnl;  CBC- wnl;  TSH=2.04;  VitD=71;  Sed=11...   ~  Apr 04, 2014:  39mo ROV & Heather [name] has established w/ Heather [name] for Primary care and has seen him x3 already... She still sees Heather [name] for ENT and Heather [name] for [name]>>    From the Pulmonary standpoint she is c/o similar symptoms to prev- cough, harking up thick balls of white phlegm "it's so bad- big lumps, it's so slimy that it just jumps out, it's unbearable"; she notes some mild SOB/DOE but not progressive & by all accounts she gets along remarkably well for 82 y/o; she denies f/c/s but notes "I get hot"; she denies CP, palpit, dizzy, edema; prev asked to take Mucinex but she wouldn't do it because Heather [name] told her not to for some reason but she is no better on his regimen...    BASELINE DATA>>   Baseline CXRs showed sl overinflation, clear lungs, normal heart size w/ elongation & tortuous Ao...  Prev CT Chest/ CTA 3/14 showed no PE, no acute findings, +atherosclerotic changes, several scattered tiny nodules 3-47mm size & no change from 2009  scan, s/p left mastectomy, DJD Tspine...   PFTs 9/13 were WNL> FVC=2.62 (128%), FEV1=2.20 (158%), %1sec=84; mid-flows=274% of predicted  CXR 04/04/14 shows stable heart size, atherosclerotic & tortuous Ao, mild hyperinflation, clear lungs w/o acute changes, DJD Tspine...  PLAN:  She is asked to start [place] 600mg  one tab Qid w/ plenty of water; and DELSYM 2 tsp po Qhs...  I tried to give her maximum reassurance & we will recheck her in 3 months...  ~  July 03, 2014:  35mo ROV & in the interval Heather [name] has seen Heather [name] x3, Heather [name] x1, and went to the [facility] (all notes reviewed)-  regarding her abd discomfort, wt loss, LPR/ coking/ coughing...    From the pulmonary standpoint she says she is breathing OK, then she says she meant "terrible" and notes the "I hark a whole lot" stating the phlegm is thick & bitter w/ big lumps & wakes her up at 3-4AM; she cannot articulate any real SOB/DOE & she remains active;  She stopped the prev Mucinex and uses Delsym prn- as noted prev PFT in 2013 was wnl;  She has mult mult somatic complaints...  We discussed rechecking PFT & Ambulatory O2 sat test today...  PFT > despite mult attempts w/ several test administrators/ staff- she was unable to perform the procedure due to inability to cooperate...  Ambulatory O2sat Test  on RA>  Baseline O2sat= 99% w/ pulse=86;  After 3 laps her nadir O2sat= 96% w/ max pulse= 114... PLAN>> we decided to add HUMIBID-DM one cap Bid w/ Fluids (she won't take Mucinex due to something that Heather [name] said yrs ago); we reviewed the important ANTI-REFLUX regimen to help her nocturnal symptoms...  ~  September 21, 2014:  8mo ROV & pt added-on today at her request for mult symptoms> but on questioning it is apparent that her symptomatology is chronic w/o acute features; she notes cough ("hocking cough"), thick white sputum ("with white balls"), chest congestion; but she denies to me any f/c/s, CP, hemoptysis, etc and she notes that "my breathing is  good";;;  Then she launches into a litany of complaints- poor appetite, wt loss ("I'm nothing but a frame"), back pain ("the drawing kind"), and general flight of ideas;  Meds reviewed & include prev ZPak & Mucinex (?taking 1 Qid);  Exam is neg w/ clear chest- no wheezing, rales, or rhonchi hear;  Last CXR was 5/15 showing essentially clear lungs, NAD...    She saw Heather [name] for Heather [name] 9/15> Hx HBP, CHF/ DD, AtypCP, RBBB, neg Myoview 2009; on Coreg3.125Bid, Amlod2.5, Lasix40 as needed for swelling; BP= 112/60 today...     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     Hx mult Ortho complaints and LBP evaluated by Heather [name] w/ Epid shots per Heather [name] but she claims no improvement; notes Tramadol doesn't help and want Vicodin refilled, she wears a back brace...     Very anxoius and has Celexa10 & Alprazolam 0.5mg  on her med list...   EKG 9/15 showed NSR, rate81, LAD, RBBB, NSSTTWA, NAD...  PLAN>>  Heather [name] is reassured- her breathing is stable w/o acute lung problems at this time; she is asked to continue the Mucinex600mg  Qid w/ fluids, and remain as active as possible; reminded to use her ProtonixBid & follow antireflux regimen to help her LPR;  She will f/u w/ her other physicians and see me in 6 months for re-evaluation & f/u CXR...  ~  Mar 22, 2015:  84mo ROV & Heather [name] says "I'm just skin & bones" weight is noted to be down 15# to 120# today (BMI=22);  She says that her dentist noted her teeth are wearing out & this may acct for some wt loss- needs to incr nutritional supplement like BOOST Bid;  She persists w/ mult medical complaints- "this swallowing business" and she is seeing both Heather [name] and Heather [name] for help w/ reflux and cancer phobia "it can show up anywhere";  From the resp standpoint she notes same "hocking" throat clearing cough w/  thick, white, foamy sput; she notes SOB/DOE w/o change and she is still not taking the Alpraz or the Mucinex as requested...      She saw Heather [name] 1/16> note reviewed & felt to be stable... She tells me she has switched Primary Care to [facility]...      She saw Heather [name] 1/16 and they felt no further [name] w/u warranted, use abd binder prn...      She saw DrEdwards, [name] 2/16> bloating, constip, wt loss> note reviewed EXAM reveals Afeb, VSS, O2sat=99% on RA;  HEENT- neg x sl dry MMs;  Chest- she has a forced dry hacking cough that is nonproductive, clear w/o w/r/r;  Heart- RR w/o m/r/g heard;  Abd- soft, nontender,  neg...  We reviewed prob list, meds, xrays and labs> see below for updates >>   LABS 11/2014 were WNL.Marland KitchenMarland Kitchen  CXR 03/22/15 showed stable cardiomeg, chronic changes, old right rib fxs, NAD...  PLAN>>  Please Nani take the Mucinex600mg - one tab Qid w/ fluids, and try the Alprazolam 0.5mg  1/2 tab Tid to start; I have tried to reassure her as best I can, she needs to eat better & take the nutritional supplement Bid...  (NOTE> I spent 26min face-to-face w/ pt discussing her symptoms, examining pt, reviewing recommendations, and answering questions)  ~  July 16, 2015:  16mo ROV & Heather [name] presents w/ her usual litany of somatic complaints- "terrible" she says; c/o feeling hot, eyes run water, had dental work done, still w/ mult [name] issues, unhappy w/ PCP, etc... She saw DrEdwards for [name] eval & his note is reviewed; she went to the ER 05/17/15 w/ [name] complaints and the ER physician ordered a CT Abd & Pelvis> a few tiny 3-2mm nodules were noted in the lateral aspect of the RML, the abd revealed aortoiliac calcif w/o aneurysm, right renal cyst, otherw no signif abn noted (Radiology rec f/u CT Chest in 29yr); she is very concerned and I indicated that I thought this was prob some scar tissue but we would recheck her CXR in 66mo & follow up the CT in 73yr...     AB> she c/o cough "hoking little white balls"; baseline CXRs  have been clear (old right rib fxs), PFT in 2013 was wnl, oxygenation is wnl, and exam is clear; she has been asked to use Mucinex Qid but she won't do it...    Abn CT scan w/ scat pulm nodules> prev CT Chest w/ tiny left apical oapc, unchanged serially & felt to be benign; CT Abd done 05/17/15 w/ tiny 3-51mm RML nodules detected, otherw eg & we discussed f/u CXR in 53mo & CT Chest in 66yr.     HBP> on Coreg3.125Bid, Lasix40, off Amlod; BP= 106/60 & she persists w/ mult somatic complaints; last 2DEcho 10/2009 showed norm LV size & func w/ EF=55-60%, mildAI....    HxCP> on ASA81; followed by Heather [name]/ DrNishan for Heather [name] & their prev notes are reviewed=> neg ischemic work up previously...     VI, Edema> she went to the ER 11/13 w/ edema; VDopplers were neg for DVT; they reiterated the recs for no salt, elevation, support hose, & continue Lasix40.     [name]- HH, Gastirits, Divertics, chronic pain & "swelling"> on Protonix40Bid plus Zantac, Miralax & Senakot-S, etc; she persists w/ mult [name] complaints despite prev evals by [facility], [facility], [facility]...    Renal cyst> she is very anxious about everything & has a cancer phobia; Heather [name] followed this benign simple cyst for yrs- no change...     Hx Breast cancer> she had left mod radical mastectomy 1994 by DrBlievernicht- no known recurrence & she is given reassurance...     Ortho- DJD, Cspine spondy, LBP, CTS> followed by Heather [name] & he injected her right knee & her back; on Vicodin prn...    Anxiety> on Alpraz0.5mg Tid but "I'm not taking it very often" she says & encouraged to use more regularly...  We reviewed prob list, meds, xrays and labs> see below for updates >> her PCP is Artist Beach, Animal nutritionist at [place]...  Ba swallow 03/23/15 showed espohageal dysmotility w/ full column stasis and tertiart contractions, no stricture detected...  CT Abd & Pelvis 05/17/15 showed a few tiny 3-1mm nodules were noted in the lateral  aspect of the RML, the abd revealed  aortoiliac calcif w/o aneurysm, right renal cyst, otherw no signif abn noted (Radiology rec f/u CT Chest in 109yr).  LABS 05/17/15 via ER> Chems- wnl;  CBC- wnl... IMP/PLAN>>  I told Araina that the tiny nodules seen in the RML area on CT Abd were likely benign & reassured her that we would follow this area closely w/ CXR in 60mo & CT Chest in 31mo; in the meanwhile she will continue her current meds & maintain follow up w/ her PCP & various specialists as needed...   ~  January 17, 2016:  80mo ROV & [place] notes that her breathing is actually doing very well- SOB at baseline, denies CP, palpit, cough, sput, hemoptysis, etc... However she continues to have a litany of somatic complaints that she enumerates at each visit> she tells me that she has left Heather [name] because he didn't know anything else to do & is now seeing Alphonsa Gin at [place]; he [name] is DrEdwards... "I'm noting but skin & bones", "foood don't do me no good", "my appetite is good & I eat soul frood", "I know what's going on w/ me" but she didn't elaborate, "some meds don't agree w/ others", last med was $400 she says (from Urology), "there's nothing in my stomach except my inards", she was upset w/ AVS showing diagnosis of anxiety...     AB> she c/o cough "hoking little white balls"; baseline CXRs have been clear (old right rib fxs), PFT in 2013 was wnl, oxygenation is wnl, and exam is clear; she has been asked to use Mucinex Qid but she won't do it...    Abn CT scan w/ scat pulm nodules> prev CT Chest w/ tiny left apical opac, unchanged serially & felt to be benign; CT Abd done 05/17/15 w/ tiny 3-66mm RML nodules detected, otherw eg & we discussed f/u CXR in 67mo & CT Chest in 64yr.     HxCP> on ASA81; followed by Heather [name]/ DrNishan for Heather [name] & their prev notes are reviewed=> neg ischemic work up previously...     [name]- HH, Gastirits, Divertics, chronic pain & "swelling"> on Protonix40Bid plus Zantac, Miralax & Senakot-S, etc; she persists w/  mult [name] complaints despite prev evals by [facility], [facility], [facility]; most recent testing 09/2015 by Heather [name]- see imaging studies below;  ENT eval by DrJRosen...    Mult Medical Issues> now followed by PCP Heather [name] EXAM reveals Afeb, VSS, O2sat=99% on RA;  HEENT- neg x sl dry MMs;  Chest- she has a forced dry hacking cough that is nonproductive, clear w/o w/r/r;  Heart- RR w/o m/r/g heard;  Abd- soft, nontender, neg...   EKG 08/2015>  NSR, rate74, RBBB, otherw neg ekg...  CXR 09/2105>  Borderline heart size, tortuous Ao, clear sl hyperexpanded lungs, no adenopathy- NAD...   LABS in Epic 09/2015>  Chems- wnl;  CBC- wnl  Abd Sonar 11/2015> heterogeneous liver texture- rec MRI for further eval...  MRI Abd 11/2015> no suspicious hepatic lesions, incidental tiny cyst noted, otherw MRI of Abd was neg...   MBS 11/2015> mod oral phase dysphagia, see full report IMP/PLAN>>  Heather [name] is stable from the pulm standpoint;  She has mult somatic complaints & a large physician contingent attending to her various symptoms;  I suggested to her that she would be best served by taking the Xanax regularly 0.5mg - 1/2 to 1 tab Tid regularly...            Problem List:  RESPIRATORY SYMPTOMS >> c/o "  hocking phlegm" DYSPNEA (ICD-786.9) - eval 5/11 c/o SOB- can't get a DB, can't get enough air in, etc... occurs at rest & w/ exerc "I'm very active despite my arthritis", talks rapidly in long sentences w/o apparent distress... no cough, phlegm, hemoptysis, etc- but as usual she is quite distressed by these symptoms and wants further eval> we discussed re-assessment w/ CXR (clear, NAD);  PFT (totally norm airflow);  Labs (all WNL);  Rx w/ ALPRAZOLAM 0.5mg  Tid & encouraged to take regularly. ~  CXR 4/13 showed norm heart size, clear lungs, mild TSpine spondylosis... ~  She continues to complain of drainage, throat symptoms, etc; Rx w/ Mucinex, OTC antihist, Nasonex, etc;  Seen by ENT, DrRosen w/ ?xerostomia & rec incr  water etc. ~  9/13: Add-on visit for "hocking, it's not a cough, it's a hock"> see above... ~  9/13: CXR shows sl overinflation, clear lungs, normal heart size w/ elongation & tortuous Ao;  PFTs are wnl> FVC=2.62 (128%), FEV1=2.20 (158%), %1sec=84; mid-flows=274% of predicted... We discussed trial rx for her symptoms- Mucinex, Fluids, Hycodan, Dulera100- 1puffbid. ~  10/13: she reports [facility] to [facility] w/ throat & mouth symptoms "like glue" therefore stopped this med; advised to continue the others. ~  3/14:  CT Chest (done after call from Ortho PA regarding her symptoms)=> then CTA (done via an ER visit) showed no PE, no acute findings, +atherosclerotic changes, several scattered tiny nodules 3-71mm size & no change from 2009 scan, s/p left mastectomy, DJD Tspine...  ~  4/14: she is asked to increase the Mucinex600mg  to [facility] + plenty of water daily... ~  10/14: CXR showed normal heart size w/ tortuous atherosclerotic Ao, clear lungs w/ ?vague opac left mid zone adjacent to hilum (nothing seen in this area on CT 3/14) ?overlying shadows & we will f/u...  ~  CXR 04/04/14 shows stable heart size, atherosclerotic & tortuous Ao, mild hyperinflation, clear lungs w/o acute changes, DJD Tspine... ~  8/15 & 11/15> she remains stable from the pulmonary standpoint on Mucinex600Qid + Fluids; rec to follow better antireflux regimen for her cough, LPR, choking...  ~  CXR 03/22/15 showed stable cardiomeg, chronic changes, old right rib fxs, NAD ~  She went to the ER 05/17/15 w/ [name] complaints and the ER physician ordered a CT Abd & Pelvis> a few tiny 3-24mm nodules were noted in the lateral aspect of the RML, the abd revealed aortoiliac calcif w/o aneurysm, right renal cyst, otherw no signif abn noted (Radiology rec f/u CT Chest in 77yr); she is very concerned and I indicated that I thought this was prob some scar tissue but we would recheck her CXR in 60mo & follow up the CT in 66yr.Heather [name] HAS ESTABLISHED w/ Heather [name] from  Heather at [place] (prev Heather [name] at [facility])  HYPERTENSION (ICD-401.9) -  ~  on AMLODIPINE 2.5mg /d,  COREG 3.125mg Bid,  LASIX 40mg - ?taking 1/2tab/d or just using it prn swelling... ~  8/16: her meds have been adjusted> on Coreg3.125Bid, Lasix40, off Amlod; BP= 106/60 & she persists w/ mult somatic complaints; last 2DEcho 10/2009 showed norm LV size & func w/ EF=55-60%, mildAI.  Hx of CHEST PAIN (ICD-786.50) - she takes ASA 81mg /d + Tylenol Prn... followed by Heather [name] for Heather [name]. ~  Baseline EKG w/ RBBB & LAD, no acute changes... ~  NuclearStressTest done 2007 & 2009 showed normal- without scar or ischemia, EF=67%...   ~  2DEcho 7/08 showed mild AI, mild diastolic dysfunction, [id number]. ~  CT Abd  1/09 & 9/12 showed incidental coronary calcif, & aorto-iliac atherosclerotic changes as well... ~  8/13: she saw DrNishan> HBP, DD, atypCP w/ neg Myoviews & no changes made... ~  10/13:  She uses a back brace/ abd binder to help her CWP... ~  EKG 9/15 showed NSR, rate81, LAD, RBBB, NSSTTWA, NAD...  VENOUS INSUFFICIENCY (ICD-459.81) - she has mod VI and follows a low sodium diet, elevates legs when able, and wears support hose. ~  7/12:  C/o incr edema & we reviewed a 2gm sodium diet & increased her Lasix from 20mg /d to 40mg /d... ~  5/13:  She has persist complaints but mild chr VI w/ min edema & reminded to elim sodium, elev, support hose, take the Lasix... ~ 11/13: she went to the ER w/ edema; VDopplers were neg for DVT; they reiterated the recs for no salt, elevation, support hose, & continue Lasix40. ~  She has persistent edema in LEs and rec to avoid sodium, elev legs, wear support hose...  HIATAL HERNIA (ICD-553.3) & GASTRITIS (ICD-535.50) >>  ~  on OMEPRAZOLE 20mg /d & followed by Heather [name] for [name] having fired Heather [name] & [facility] in past. ~  last EGD was 2/08 w/ 3cm HH & gastitis... ~  CT Abd 1/09 revealed calcif gallstones, & mild ectasia of AA... ~  CT Abd 1/11 showed left colon divertics, tiny  hepatic lesions w/o change & right renal cyst stable. ~  CT Abd 9/12 showed divertics (no inflamm), right renal & left hep lobe cysts, atherosclerotic changes, NAD... ~  Seen by DrEdwards 4/13> c/o dysphagia & congestion; he notes mult [name] complaints, known esoph dysmotility w/o stricture, hard to pin down her complaints... ~  She has followed up w/ Heather [name] regarding her concerns about swelling in her abd... ~  CT Abd Pelvis 3/14 showed Tiny cysts in the lateral segment left hepatic lobe, 1.4 cm right upper pole renal cyst, atherosclerotic calcifications of the abdominal aorta and branch vessels, DJD spine... ~  2015: now on Protonix40Bid but not taking regularly & advised BID dosing & antireflux regimen for her LPR...   DIVERTICULOSIS, COLON (ICD-562.10) - followed regularly by Heather [name]. ~  colonoscopy 7/03 by Heather [name] showing divertics only... we discussed Miralax + Senakot-S for constipation. ~  colonoscopy 2/11 showed divertics, 79mm polyp, hems...  ? of ABDOMINAL PAIN & SYMPTOMATOLOGY - she had a very thorough work up in 2009> she has a cancer phobia and is very difficult to reassure her that everything appropriate has been done & no cancer found... Mult additional [name] evals since then from [employer] [name], [name] (DrEdwards), [facility] [name] (DrThorne)... ~  eval by [name]- Heather [name] 12/08 but pt wasn't satisfied w/ his examination... ~  full labs 1/09 were WNL, sed=20... ~  CT Abd/ Pelvis 1/09= no acute abn in abd (calcif gallstones & mild ectasia of AA), calcif fibroid in the pelvis, & ?regarding the left ovary. ~  referral to DrFontaine, GYN> his notes are reviewed... ~  follow up w/ Heather [name], Urology> his notes are reviewed... ~  eval Heather [name] for Orthopedics- rec shots in back by [facility] w/ some improvement. ~  repeat [name] eval 1/11 by Heather [name] w/ CT & colonoscpy as above- no acute problems. ~  5/11 & 7/11 >> persist complaints w/ neg recheck by Heather [name] & [facility] [name]... ~  12/11:  routine GYN  surveillance DrFontaine found anteverted uterus w/ fibroids, atrophic ovaries bilat, no mass. ~  Repeat [name] eval 4/12 by Heather [name]> he tried to reassure the pt, felt some symptoms  might be from poor abd wall muscle tone, rec continue Omep, laxatives... ~  6/12:  She sought another [name] opinion DrEdwards> he did not feel she needed any additional testing... ~  She tells me she has yet another appt to see a gastroenterologist at [facility]==> seen by DrThorne 8/12 & note reviewed... ~  EGD by DrThorne 9/12 w/ mult whitish nodular plaques in cardia & fundus> Bx results pending & she is very concerned (remember her hx cancer phobia). ~  She continues to f/u w/ DrThorne at [facility] & is happy w/ her eval... ~  Prev back w/ Heather [name] at [name] => then switched to DrEdwards at [place], [name].Marland Kitchen.  ~  CT Abd & Pelvis 05/17/15 via ER showed a few tiny 3-58mm nodules were noted in the lateral aspect of the RML, the abd revealed aortoiliac calcif w/o aneurysm, right renal cyst, otherw no signif abn noted (Radiology rec f/u CT Chest in 31yr).  RENAL CYST (ICD-593.2) - she has a 1 cm right renal cyst seen on CT in 2007 and followed by Heather [name]... f/u CT Abd 1/09 - no change in the cyst... f/u renal ultrasound 1/10 w/ small bilat cysts... f/u CT Abd 1/11- no change in cyst. ~  she saw Heather [name] 7/11 for LBP, microhematuria, urge incontinence- stable, no additional Rx. ~  she saw Heather [name] 1/12 for urge incontinence, freq, nocturia> rec Desmopressin 0.2mg - 1/2 tab Qhs. ~  No change in the renal cyst over the yrs...  CARCINOMA, BREAST, LEFT (ICD-174.9) - Surgery 9/94 by DrBlievernicht w/ Left modified radical mastectomy... +estrogen receptors and Rx w/ Tamoxifen... f/u mammograms all neg... breast exam on right has been neg- no nodules palpated... She says that DrBertrand insisted that her PCP check her right breast at each & every office visit to be sure she doesn't develop any nodules...  DEGENERATIVE JOINT DISEASE (ICD-715.90) -  on OTC meds + VICODIN Prn... she has end-stage OA of Right Knee per Heather [name] treated w/ shots in the past... she has a knee brace and a cane for ambulation... ~  8/13:  She wants a better pain pill & we discussed change to [place] prn... ~  12/13: she tells me that Heather [name] injected her knee last wk- improved...  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 Rx w/ rest, heat, Percocet, and f/u w/ Ortho...  Hx of LOW BACK PAIN, CHRONIC (ICD-724.2) - eval Heather [name] w/ rec for shots by Heather [name] & she's feeling sl better & wears back brace... ~  5/10: mult somatic complaints- primarily c/o left side/ postero-lat rib pain...  Exam w/ tender left lat/ post ribs... Bone Scan= neg... ~  MRI Lumbar spine 7/10 by Heather [name]... ~  Epidural steroid shot by Heather [name] 8/10 & 1/11 ~  Repeat LBP eval by Heather [name] w/ another MRI lumbar spine showing degenerative changes w/ mild progression since 7/10 MRI, mild-mod sp stenosis & lat recess stenosis at several levels ~  Heather [name] referred her to [facility] to consider more shots but she is not so inclined... ~  6/13: she saw DrTooke w/ lumbar spondylosis; he felt that he had nothing to offer her & rec incr Tramadol to Qid... ~  2014:  She has had numerous visits w/ Heather [name]/ Ernestina Patches for injections, XRays, scans etc...  CARPAL TUNNEL SYNDROME, BILATERAL (ICD-354.0) - she had prev right carpal tunnel release by Heather [name] and was c/o increasing discomfort in her left wrist despite wrist splint Qhs- had  left carpal tunnel release 1/10 by Heather [name]...  ANXIETY (ICD-300.00) - on ALPRAZOLAM 0.5mg  Tid & encouraged to take it regularly... she has a cancer phobia & it is very difficult to reassure her regarding her symptoms and our evaluations... ~  7/13: she went to the ER c/o swelling of the tongue but eval by EDP indicated that her tongue was normal & not swollen, she was  not on an ACE, given Pred Rx anyway. ~  She has been asked to incr the ALprazolam to regular dosing but she is reluctant...   Past Surgical History  Procedure Laterality Date  . Left mastectomy    . Carpal tunnel release      Outpatient Encounter Prescriptions as of 01/17/2016  Medication Sig  . ALPRAZolam (XANAX) 0.5 MG tablet Take 0.25-0.5 mg by mouth 3 (three) times daily as needed for anxiety or sleep.   Marland Kitchen aspirin EC 81 MG tablet Take 81 mg by mouth daily.  . bisacodyl (DULCOLAX) 5 MG EC tablet Take 5 mg by mouth daily as needed for mild constipation or moderate constipation.  . carvedilol (COREG) 3.125 MG tablet Take 1 tablet (3.125 mg total) by mouth 2 (two) times daily.  . Cholecalciferol (VITAMIN D3) 1000 UNITS CAPS Take 1,000 Units by mouth daily. For your bones  . furosemide (LASIX) 40 MG tablet Take 40 mg by mouth daily.   Marland Kitchen guaiFENesin (MUCINEX) 600 MG 12 hr tablet Take 600 mg by mouth 2 (two) times daily.  . Multiple Vitamins-Minerals (MULTIVITAMINS THER. W/MINERALS) TABS Take 1 tablet by mouth daily.  . polyethylene glycol (MIRALAX / GLYCOLAX) packet Take 17 g by mouth 2 (two) times daily as needed for mild constipation.  . [DISCONTINUED] HYDROcodone-acetaminophen (NORCO/VICODIN) 5-325 MG tablet Take 1 tablet by mouth every 6 (six) hours as needed (pain).  . citalopram (CELEXA) 10 MG tablet Take 1 tablet (10 mg total) by mouth daily. (Patient not taking: Reported on 01/17/2016)  . pantoprazole (PROTONIX) 40 MG tablet Take 1 tablet (40 mg total) by mouth 2 (two) times daily.  . ranitidine (ZANTAC) 75 MG tablet Take 1 tablet (75 mg total) by mouth 2 (two) times daily.  . [DISCONTINUED] benzonatate (TESSALON) 100 MG capsule Take 1 capsule (100 mg total) by mouth 2 (two) times daily.  . [DISCONTINUED] fluticasone (FLONASE) 50 MCG/ACT nasal spray Place 2 sprays into both nostrils daily.  . [DISCONTINUED] sodium chloride (OCEAN) 0.65 % SOLN nasal spray Place 1-2 sprays into both  nostrils daily as needed for congestion.   No facility-administered encounter medications on file as of 01/17/2016.    Allergies  Allergen Reactions  . Fentanyl     REACTION: nausea - pt states that she is not allergic     Current Medications, Allergies, Past Medical History, Past Surgical History, Family History, and Social History were reviewed in [employer] record.    Review of Systems       See HPI - all other systems neg except as noted... She has mult somatic complaints and anxiety. The patient complains of decreased hearing, chest discomfort, dyspnea on exertion, abdominal pain, incontinence, muscle weakness, and difficulty walking.  The patient denies anorexia, fever, vision loss, hoarseness, syncope, peripheral edema, prolonged cough, headaches, hemoptysis, melena, hematochezia, severe indigestion/heartburn, hematuria, transient blindness, depression, abnormal bleeding, enlarged lymph nodes, and angioedema.     Objective:   Physical Exam    WD, WN, 82 y/o BF in NAD... she is very anxious... GENERAL:  Alert & oriented; pleasant & cooperative.Marland KitchenMarland Kitchen  HEENT:  [place]/AT, EOM-wnl, PERRLA, EACs-clear, TMs-wnl, NOSE-clear, THROAT-clear & wnl, no lesions seen. NECK:  Neck ROM decreased, no JVD; normal carotid impulses w/o bruits; no thyromegaly or nodules palpated; no lymphadenopathy. CHEST:  Clear to P & A; without wheezes/ rales/ or rhonchi heard... BREAST:  s/p left mastectomy... right breast normal- without nodules palpated... HEART:  Regular Rhythm; without murmurs/ rubs/ or gallops detected... ABDOMEN:  Soft & nontender; normal bowel sounds; no organomegaly or masses palpated... EXT: without deformities, mod arthritic changes; no varicose veins/ +venous insuffic/ tr edema. NEURO:  CN's intact; no focal neuro deficits... DERM:  Small sl excoriated area of dermatitis left shin- no drainage,no rash, etc...  RADIOLOGY DATA:  Reviewed in the EPIC EMR & discussed w/  the patient...  LABORATORY DATA:  Reviewed in the EPIC EMR & discussed w/ the patient...   Assessment & Plan:    RESPIRATORY SYMPTOMS w/ Dyspnea & throat clearing, prob LPR>  She had a neg pulm eval 2011 and numerous times thereafter; she insisted on additional eval 9/13 for her chr throat clearing symptom; CXR w/o acute changes & PFTs normal for her 82 y/o lungs; she is quite insistent on med rx for her problem but has not had relief from anything; REC to take [id number], Fluids, DELSYM prn; pt very anxious & Alprazolam seems to help when she takes it... 11/15> she has been taking the OTC MUCINEX 600mg  Qid w/ fluids and seems sl improved; rec strict antireflux regimen w/ Protonix before dinner, NPO after dinner, elev HOB 6" blocks... 5/16> she is once again off all these meds and c/o incr symptoms; rec to restart the Mucinex, fluids, and antireflux regimen...  Abn CT Abd w/ several tiny nodules reported in the RML area>  We discussed this & decided to f/u CXR in 74mo & CT Chest in 52yr...   Hx CP> evaluated for [facility] [name] by DrBensimon & he has not been in favor of invasive testing; stable on conservative med rx...  [name]> HH, Gastritis, Divertics, vague abd pain>  I tried once again to reassure her about the thorough [name] evaluations that her has undergone; she is happy w/ DrThorne's eval & will f/u w/ her & in the meanwhile continue rx w/ [id number], Miralax, Metamucil, Senakot, Mylicon, etc... She has re-established w/ [facility] [name]- their notes reviewed...  Hx left Breast Cancer>  No known recurrence...  Anxiety>  Asked once again to increase the Alprazolam to 1/2 - 1 tab TID regularly...   Patient's Medications  New Prescriptions   No medications on file  Previous Medications   ALPRAZOLAM (XANAX) 0.5 MG TABLET    Take 0.25-0.5 mg by mouth 3 (three) times daily as needed for anxiety or sleep.    ASPIRIN EC 81 MG TABLET    Take 81 mg by mouth daily.   BISACODYL (DULCOLAX) 5 MG EC TABLET    Take  5 mg by mouth daily as needed for mild constipation or moderate constipation.   CARVEDILOL (COREG) 3.125 MG TABLET    Take 1 tablet (3.125 mg total) by mouth 2 (two) times daily.   CHOLECALCIFEROL (VITAMIN D3) 1000 UNITS CAPS    Take 1,000 Units by mouth daily. For your bones   CITALOPRAM (CELEXA) 10 MG TABLET    Take 1 tablet (10 mg total) by mouth daily.   FUROSEMIDE (LASIX) 40 MG TABLET    Take 40 mg by mouth daily.    GUAIFENESIN (MUCINEX) 600 MG 12 HR TABLET    Take 600 mg  by mouth 2 (two) times daily.   MULTIPLE VITAMINS-MINERALS (MULTIVITAMINS THER. W/MINERALS) TABS    Take 1 tablet by mouth daily.   PANTOPRAZOLE (PROTONIX) 40 MG TABLET    Take 1 tablet (40 mg total) by mouth 2 (two) times daily.   POLYETHYLENE GLYCOL (MIRALAX / GLYCOLAX) PACKET    Take 17 g by mouth 2 (two) times daily as needed for mild constipation.   RANITIDINE (ZANTAC) 75 MG TABLET    Take 1 tablet (75 mg total) by mouth 2 (two) times daily.  Modified Medications   No medications on file  Discontinued Medications   BENZONATATE (TESSALON) 100 MG CAPSULE    Take 1 capsule (100 mg total) by mouth 2 (two) times daily.   FLUTICASONE (FLONASE) 50 MCG/ACT NASAL SPRAY    Place 2 sprays into both nostrils daily.   HYDROCODONE-ACETAMINOPHEN (NORCO/VICODIN) 5-325 MG TABLET    Take 1 tablet by mouth every 6 (six) hours as needed (pain).   SODIUM CHLORIDE (OCEAN) 0.65 % SOLN NASAL SPRAY    Place 1-2 sprays into both nostrils daily as needed for congestion.

## 2016-01-22 DIAGNOSIS — R609 Edema, unspecified: Secondary | ICD-10-CM | POA: Diagnosis not present

## 2016-02-06 DIAGNOSIS — M79604 Pain in right leg: Secondary | ICD-10-CM | POA: Diagnosis not present

## 2016-02-06 DIAGNOSIS — M79605 Pain in left leg: Secondary | ICD-10-CM | POA: Diagnosis not present

## 2016-02-06 DIAGNOSIS — R600 Localized edema: Secondary | ICD-10-CM | POA: Diagnosis not present

## 2016-02-13 DIAGNOSIS — M1990 Unspecified osteoarthritis, unspecified site: Secondary | ICD-10-CM | POA: Diagnosis not present

## 2016-02-13 DIAGNOSIS — I872 Venous insufficiency (chronic) (peripheral): Secondary | ICD-10-CM | POA: Diagnosis not present

## 2016-02-13 DIAGNOSIS — I5030 Unspecified diastolic (congestive) heart failure: Secondary | ICD-10-CM | POA: Diagnosis not present

## 2016-02-13 DIAGNOSIS — R109 Unspecified abdominal pain: Secondary | ICD-10-CM | POA: Diagnosis not present

## 2016-02-13 DIAGNOSIS — J309 Allergic rhinitis, unspecified: Secondary | ICD-10-CM | POA: Diagnosis not present

## 2016-02-18 DIAGNOSIS — Z1231 Encounter for screening mammogram for malignant neoplasm of breast: Secondary | ICD-10-CM | POA: Diagnosis not present

## 2016-03-07 DIAGNOSIS — H02054 Trichiasis without entropian left upper eyelid: Secondary | ICD-10-CM | POA: Diagnosis not present

## 2016-03-07 DIAGNOSIS — H10413 Chronic giant papillary conjunctivitis, bilateral: Secondary | ICD-10-CM | POA: Diagnosis not present

## 2016-03-20 DIAGNOSIS — I10 Essential (primary) hypertension: Secondary | ICD-10-CM | POA: Diagnosis not present

## 2016-03-20 DIAGNOSIS — F419 Anxiety disorder, unspecified: Secondary | ICD-10-CM | POA: Diagnosis not present

## 2016-03-20 DIAGNOSIS — R7303 Prediabetes: Secondary | ICD-10-CM | POA: Diagnosis not present

## 2016-03-20 DIAGNOSIS — C50912 Malignant neoplasm of unspecified site of left female breast: Secondary | ICD-10-CM | POA: Diagnosis not present

## 2016-03-20 DIAGNOSIS — E46 Unspecified protein-calorie malnutrition: Secondary | ICD-10-CM | POA: Diagnosis not present

## 2016-03-20 DIAGNOSIS — K219 Gastro-esophageal reflux disease without esophagitis: Secondary | ICD-10-CM | POA: Diagnosis not present

## 2016-03-20 DIAGNOSIS — Z1389 Encounter for screening for other disorder: Secondary | ICD-10-CM | POA: Diagnosis not present

## 2016-03-20 DIAGNOSIS — N183 Chronic kidney disease, stage 3 (moderate): Secondary | ICD-10-CM | POA: Diagnosis not present

## 2016-03-20 DIAGNOSIS — I872 Venous insufficiency (chronic) (peripheral): Secondary | ICD-10-CM | POA: Diagnosis not present

## 2016-03-20 DIAGNOSIS — I5030 Unspecified diastolic (congestive) heart failure: Secondary | ICD-10-CM | POA: Diagnosis not present

## 2016-03-24 DIAGNOSIS — M1711 Unilateral primary osteoarthritis, right knee: Secondary | ICD-10-CM | POA: Diagnosis not present

## 2016-03-28 ENCOUNTER — Other Ambulatory Visit: Payer: Self-pay | Admitting: Internal Medicine

## 2016-03-28 DIAGNOSIS — R0989 Other specified symptoms and signs involving the circulatory and respiratory systems: Secondary | ICD-10-CM

## 2016-03-31 ENCOUNTER — Ambulatory Visit
Admission: RE | Admit: 2016-03-31 | Discharge: 2016-03-31 | Disposition: A | Discharge status: Home / Self Care | Payer: Medicare Other | Source: Ambulatory Visit | Attending: Internal Medicine | Admitting: Internal Medicine

## 2016-03-31 DIAGNOSIS — R0989 Other specified symptoms and signs involving the circulatory and respiratory systems: Secondary | ICD-10-CM

## 2016-03-31 DIAGNOSIS — I10 Essential (primary) hypertension: Secondary | ICD-10-CM | POA: Diagnosis not present

## 2016-03-31 DIAGNOSIS — M79609 Pain in unspecified limb: Secondary | ICD-10-CM | POA: Diagnosis not present

## 2016-04-04 DIAGNOSIS — R5383 Other fatigue: Secondary | ICD-10-CM | POA: Diagnosis not present

## 2016-04-04 DIAGNOSIS — Z131 Encounter for screening for diabetes mellitus: Secondary | ICD-10-CM | POA: Diagnosis not present

## 2016-04-04 DIAGNOSIS — R634 Abnormal weight loss: Secondary | ICD-10-CM | POA: Diagnosis not present

## 2016-04-04 DIAGNOSIS — I10 Essential (primary) hypertension: Secondary | ICD-10-CM | POA: Diagnosis not present

## 2016-04-04 DIAGNOSIS — N390 Urinary tract infection, site not specified: Secondary | ICD-10-CM | POA: Diagnosis not present

## 2016-04-10 DIAGNOSIS — R0982 Postnasal drip: Secondary | ICD-10-CM | POA: Diagnosis not present

## 2016-04-10 DIAGNOSIS — R634 Abnormal weight loss: Secondary | ICD-10-CM | POA: Diagnosis not present

## 2016-04-17 DIAGNOSIS — Z136 Encounter for screening for cardiovascular disorders: Secondary | ICD-10-CM | POA: Diagnosis not present

## 2016-04-17 DIAGNOSIS — K219 Gastro-esophageal reflux disease without esophagitis: Secondary | ICD-10-CM | POA: Diagnosis not present

## 2016-04-17 DIAGNOSIS — H538 Other visual disturbances: Secondary | ICD-10-CM | POA: Diagnosis not present

## 2016-04-17 DIAGNOSIS — J449 Chronic obstructive pulmonary disease, unspecified: Secondary | ICD-10-CM | POA: Diagnosis not present

## 2016-04-17 DIAGNOSIS — F419 Anxiety disorder, unspecified: Secondary | ICD-10-CM | POA: Diagnosis not present

## 2016-04-17 DIAGNOSIS — Z0000 Encounter for general adult medical examination without abnormal findings: Secondary | ICD-10-CM | POA: Diagnosis not present

## 2016-04-17 DIAGNOSIS — R634 Abnormal weight loss: Secondary | ICD-10-CM | POA: Diagnosis not present

## 2016-04-17 DIAGNOSIS — R7303 Prediabetes: Secondary | ICD-10-CM | POA: Diagnosis not present

## 2016-04-17 DIAGNOSIS — I10 Essential (primary) hypertension: Secondary | ICD-10-CM | POA: Diagnosis not present

## 2016-04-29 DIAGNOSIS — K219 Gastro-esophageal reflux disease without esophagitis: Secondary | ICD-10-CM | POA: Diagnosis not present

## 2016-04-29 DIAGNOSIS — R634 Abnormal weight loss: Secondary | ICD-10-CM | POA: Diagnosis not present

## 2016-04-29 DIAGNOSIS — F419 Anxiety disorder, unspecified: Secondary | ICD-10-CM | POA: Diagnosis not present

## 2016-04-29 DIAGNOSIS — J449 Chronic obstructive pulmonary disease, unspecified: Secondary | ICD-10-CM | POA: Diagnosis not present

## 2016-04-29 DIAGNOSIS — I10 Essential (primary) hypertension: Secondary | ICD-10-CM | POA: Diagnosis not present

## 2016-04-29 DIAGNOSIS — R7303 Prediabetes: Secondary | ICD-10-CM | POA: Diagnosis not present

## 2016-05-02 ENCOUNTER — Emergency Department (HOSPITAL_COMMUNITY): Payer: Medicare Other

## 2016-05-02 ENCOUNTER — Emergency Department (HOSPITAL_COMMUNITY)
Admission: EM | Admit: 2016-05-02 | Discharge: 2016-05-03 | Disposition: A | Discharge status: Home / Self Care | Payer: Medicare Other | Attending: Emergency Medicine | Admitting: Emergency Medicine

## 2016-05-02 ENCOUNTER — Encounter (HOSPITAL_COMMUNITY): Payer: Self-pay | Admitting: Emergency Medicine

## 2016-05-02 DIAGNOSIS — I10 Essential (primary) hypertension: Secondary | ICD-10-CM | POA: Insufficient documentation

## 2016-05-02 DIAGNOSIS — K5900 Constipation, unspecified: Secondary | ICD-10-CM

## 2016-05-02 DIAGNOSIS — R109 Unspecified abdominal pain: Secondary | ICD-10-CM

## 2016-05-02 DIAGNOSIS — Z853 Personal history of malignant neoplasm of breast: Secondary | ICD-10-CM | POA: Diagnosis not present

## 2016-05-02 DIAGNOSIS — R52 Pain, unspecified: Secondary | ICD-10-CM

## 2016-05-02 DIAGNOSIS — Z79899 Other long term (current) drug therapy: Secondary | ICD-10-CM | POA: Insufficient documentation

## 2016-05-02 LAB — COMPREHENSIVE METABOLIC PANEL
ALBUMIN: 3.9 g/dL (ref 3.5–5.0)
ALK PHOS: 35 U/L — AB (ref 38–126)
ALT: 19 U/L (ref 14–54)
ANION GAP: 8 (ref 5–15)
AST: 24 U/L (ref 15–41)
BILIRUBIN TOTAL: 0.9 mg/dL (ref 0.3–1.2)
BUN: 21 mg/dL — ABNORMAL HIGH (ref 6–20)
CALCIUM: 9 mg/dL (ref 8.9–10.3)
CO2: 29 mmol/L (ref 22–32)
Chloride: 102 mmol/L (ref 101–111)
Creatinine, Ser: 0.93 mg/dL (ref 0.44–1.00)
GFR calc non Af Amer: 51 mL/min — ABNORMAL LOW (ref >60)
GFR, EST AFRICAN AMERICAN: 59 mL/min — AB (ref >60)
GLUCOSE: 104 mg/dL — AB (ref 65–99)
Potassium: 3.7 mmol/L (ref 3.5–5.1)
Sodium: 139 mmol/L (ref 135–145)
Total Protein: 6.5 g/dL (ref 6.5–8.1)

## 2016-05-02 LAB — URINALYSIS, ROUTINE W REFLEX MICROSCOPIC
BILIRUBIN URINE: NEGATIVE
GLUCOSE, UA: NEGATIVE mg/dL
Ketones, ur: NEGATIVE mg/dL
Leukocytes, UA: NEGATIVE
Nitrite: NEGATIVE
PH: 6.5 (ref 5.0–8.0)
Protein, ur: NEGATIVE mg/dL
SPECIFIC GRAVITY, URINE: 1.016 (ref 1.005–1.030)

## 2016-05-02 LAB — URINE MICROSCOPIC-ADD ON
BACTERIA UA: NONE SEEN
WBC, UA: NONE SEEN WBC/hpf (ref 0–5)

## 2016-05-02 LAB — CBC
HCT: 38.4 % (ref 36.0–46.0)
HEMOGLOBIN: 12.5 g/dL (ref 12.0–15.0)
MCH: 30.7 pg (ref 26.0–34.0)
MCHC: 32.6 g/dL (ref 30.0–36.0)
MCV: 94.3 fL (ref 78.0–100.0)
Platelets: 205 10*3/uL (ref 150–400)
RBC: 4.07 MIL/uL (ref 3.87–5.11)
RDW: 13.9 % (ref 11.5–15.5)
WBC: 8.5 10*3/uL (ref 4.0–10.5)

## 2016-05-02 LAB — LIPASE, BLOOD: Lipase: 27 U/L (ref 11–51)

## 2016-05-02 MED ORDER — IOPAMIDOL (ISOVUE-300) INJECTION 61%
100.0000 mL | Freq: Once | INTRAVENOUS | Status: AC | PRN
  Administered 2016-05-02: 80 mL via INTRAVENOUS

## 2016-05-02 NOTE — ED Provider Notes (Signed)
CSN: [id number]     Arrival date & time 05/02/16  1435 History   First MD Initiated Contact with Patient 05/02/16 1926     Chief Complaint  Patient presents with  . Abdominal Pain     (Consider location/radiation/quality/duration/timing/severity/associated sxs/prior Treatment) HPI   MIHO TUNE is a 82 year old female with a past medical history of HTN, arthritis who presents to ED today complaining of abdominal pain. Patient states that she has had a small amount of right-sided abdominal pain ongoing for the last 6-7 months. However, the last 3-4 days the pain has become much more severe. Pain is located in the right side of her abdomen and radiates to her back. It is worse when she moves or with walking. She states that every time she goes to stand up the pain "takes her breath away." She also reports increasing urinary frequency. Patient is having normal bowel movements. She denies any dysuria, hematuria, nausea, vomiting, fevers, chills. Patient also reports a unintentional 20 pound weight loss over the last 3 years.  Past Medical History  Diagnosis Date  . RBBB (right bundle branch block with left anterior fascicular block)   . Hypertension   . Hiatal hernia   . Gastritis, chronic   . Breast cancer ([place])     NO STICK OR BLOOD PRESSURE CHECKS IN LEFT ARM  . Degenerative joint disease   . Anxiety state, unspecified   . Low back pain   . Carpal tunnel syndrome   . Renal cyst   . Diverticula of colon   . Cervical spondylosis   . Diastolic dysfunction   . Venous insufficiency   . Constipation   . Colon polyp 12/19/2009    Transverse-polypoid colorectal mucosa  . Allergic rhinitis, cause unspecified 02/21/2014   Past Surgical History  Procedure Laterality Date  . Left mastectomy    . Carpal tunnel release     Family History  Problem Relation Age of Onset  . Colon cancer Neg Hx    Social History  Substance Use Topics  . Smoking status: Never Smoker   . Smokeless  tobacco: Never Used  . Alcohol Use: No   OB History    No data available     Review of Systems  All other systems reviewed and are negative.     Allergies  Fentanyl  Home Medications   Prior to Admission medications   Medication Sig Start Date End Date Taking? Authorizing Provider  ALPRAZolam Duanne Moron) 0.5 MG tablet Take 0.25-0.5 mg by mouth 3 (three) times daily as needed for anxiety or sleep.    Yes Historical Provider, MD  carvedilol (COREG) 3.125 MG tablet Take 1 tablet (3.125 mg total) by mouth 2 (two) times daily. 02/02/14  Yes Biagio Borg, MD  Cholecalciferol (VITAMIN D3) 1000 UNITS CAPS Take 1,000 Units by mouth daily. For your bones   Yes Historical Provider, MD  fluticasone (FLONASE) 50 MCG/ACT nasal spray Place 2 sprays into both nostrils daily.   Yes Historical Provider, MD  furosemide (LASIX) 40 MG tablet Take 40 mg by mouth daily.    Yes Historical Provider, MD  guaiFENesin (MUCINEX) 600 MG 12 hr tablet Take 600 mg by mouth 2 (two) times daily.   Yes Historical Provider, MD  Multiple Vitamins-Minerals (MULTIVITAMINS THER. W/MINERALS) TABS Take 1 tablet by mouth daily.   Yes Historical Provider, MD  OVER THE COUNTER MEDICATION Take 8 oz by mouth 2 (two) times daily as needed (for constipation). Patient states she drink about  2 glasses of prune juice a day for constipation   Yes Historical Provider, MD  ranitidine (ZANTAC) 75 MG tablet Take 1 tablet (75 mg total) by mouth 2 (two) times daily. Patient taking differently: Take 75 mg by mouth 2 (two) times daily as needed for heartburn.  05/17/15  Yes Serita Grit, MD  citalopram (CELEXA) 10 MG tablet Take 1 tablet (10 mg total) by mouth daily. Patient not taking: Reported on 01/17/2016 08/03/14   Biagio Borg, MD  pantoprazole (PROTONIX) 40 MG tablet Take 1 tablet (40 mg total) by mouth 2 (two) times daily. Patient not taking: Reported on 05/02/2016 09/21/14   Biagio Borg, MD   BP 130/61 mmHg  Pulse 69  Temp(Src) 97.4 F (36.3  C) (Oral)  Resp 18  SpO2 100% Physical Exam  Constitutional: She is oriented to person, place, and time. She appears well-developed and well-nourished. No distress.  HENT:  Head: Normocephalic and atraumatic.  Mouth/Throat: No oropharyngeal exudate.  Eyes: Conjunctivae and EOM are normal. Pupils are equal, round, and reactive to light. Right eye exhibits no discharge. Left eye exhibits no discharge. No scleral icterus.  Cardiovascular: Normal rate, regular rhythm, normal heart sounds and intact distal pulses.  Exam reveals no gallop and no friction rub.   No murmur heard. Pulmonary/Chest: Effort normal and breath sounds normal. No respiratory distress. She has no wheezes. She has no rales. She exhibits no tenderness.  Abdominal: Soft. Bowel sounds are normal. She exhibits no distension and no mass. There is tenderness (mild TTP in RUQ). There is no rebound and no guarding.  No CVA tenderness  Musculoskeletal: Normal range of motion. She exhibits no edema.  Neurological: She is alert and oriented to person, place, and time. No cranial nerve deficit.  Strength 5/5 throughout. No sensory deficits.  No gait abnormality.  Skin: Skin is warm and dry. No rash noted. She is not diaphoretic. No erythema. No pallor.  Psychiatric: She has a normal mood and affect. Her behavior is normal.  Nursing note and vitals reviewed.   ED Course  Procedures (including critical care time) Labs Review Labs Reviewed  COMPREHENSIVE METABOLIC PANEL - Abnormal; Notable for the following:    Glucose, Bld 104 (*)    BUN 21 (*)    Alkaline Phosphatase 35 (*)    GFR calc non Af Amer 51 (*)    GFR calc Af Amer 59 (*)    All other components within normal limits  LIPASE, BLOOD  CBC  URINALYSIS, ROUTINE W REFLEX MICROSCOPIC (NOT AT [facility])    Imaging Review No results found. I have personally reviewed and evaluated these images and lab results as part of my medical decision-making.   EKG Interpretation None       MDM   Final diagnoses:  Constipation, unspecified constipation type  Abdominal pain, unspecified abdominal location    82 year old female history of HTN, arthritis presents to the ED complaining of worsening right-sided abdominal pain. Patient states she has had this same pain the last 6 or 7 months but it has worsened in the last 3 days. Patient appears well in the ED, nontoxic, nonseptic appearing and in no apparent distress. All vital signs are stable. There is mild tenderness in right upper quadrant. Abdomen is soft, no rigidity. No back pain or CVA tenderness. Patient does report intermittent constipation area and she states she does not take her home MiraLAX or stool softeners as prescribed. CT abdomen pelvis obtained which reveals moderate stool burden in  the right colon. No bowel obstruction. Aorta is normal in size. No other acute abnormality noted. No leukocytosis. UA negative for infection. All other lab work within normal limits. Patient's abdominal pain is likely related to her chronic constipation. We'll give prescription for Colace and had a lengthy discussion with patient of how to take MiraLAX appropriately. Patient will follow-up with her PCP next week for reevaluation. Return precautions outlined in patient discharge instructions.  Case discussed with Dr. Jeneen Rinks agrees with treatment plan.    Dondra Spry Sumner, PA-C 05/05/16 1331  Tanna Furry, MD 05/14/16 [phone]

## 2016-05-02 NOTE — ED Notes (Signed)
Pt reports abd pain x several months , was seen by PCP , she sts. Pt denies dysuria yet reports frequency . denies NVD. Alert and oriented x 4.

## 2016-05-02 NOTE — ED Notes (Signed)
Patient escorted to restroom.

## 2016-05-03 MED ORDER — DOCUSATE SODIUM 100 MG PO CAPS: 100.0000 mg | ORAL_CAPSULE | Freq: Two times a day (BID) | ORAL | Status: DC

## 2016-05-03 NOTE — Discharge Instructions (Signed)
Abdominal Pain, Adult °Many things can cause abdominal pain. Usually, abdominal pain is not caused by a disease and will improve without treatment. It can often be observed and treated at home. Your health care provider will do a physical exam and possibly order blood tests and X-rays to help determine the seriousness of your pain. However, in many cases, more time must pass before a clear cause of the pain can be found. Before that point, your health care provider may not know if you need more testing or further treatment. °HOME CARE INSTRUCTIONS °Monitor your abdominal pain for any changes. The following actions may help to alleviate any discomfort you are experiencing: °· Only take over-the-counter or prescription medicines as directed by your health care provider. °· Do not take laxatives unless directed to do so by your health care provider. °· Try a clear liquid diet (broth, tea, or water) as directed by your health care provider. Slowly move to a bland diet as tolerated. °SEEK MEDICAL CARE IF: °· You have unexplained abdominal pain. °· You have abdominal pain associated with nausea or diarrhea. °· You have pain when you urinate or have a bowel movement. °· You experience abdominal pain that wakes you in the night. °· You have abdominal pain that is worsened or improved by eating food. °· You have abdominal pain that is worsened with eating fatty foods. °· You have a fever. °SEEK IMMEDIATE MEDICAL CARE IF: °· Your pain does not go away within 2 hours. °· You keep throwing up (vomiting). °· Your pain is felt only in portions of the abdomen, such as the right side or the left lower portion of the abdomen. °· You pass bloody or black tarry stools. °MAKE SURE YOU: °· Understand these instructions. °· Will watch your condition. °· Will get help right away if you are not doing well or get worse. °  °This information is not intended to replace advice given to you by your health care provider. Make sure you discuss  any questions you have with your health care provider. °  °Document Released: 08/13/2005 Document Revised: 07/25/2015 Document Reviewed: 07/13/2013 °Elsevier Interactive Patient Education ©2016 [employer] ° °Constipation, Adult °Constipation is when a person: °· Poops (has a bowel movement) less than 3 times a week. °· Has a hard time pooping. °· Has poop that is dry, hard, or bigger than normal. °HOME CARE  °· Eat foods with a lot of fiber in them. This includes fruits, vegetables, beans, and whole grains such as brown rice. °· Avoid fatty foods and foods with a lot of sugar. This includes french fries, hamburgers, cookies, candy, and soda. °· If you are not getting enough fiber from food, take products with added fiber in them (supplements). °· Drink enough fluid to keep your pee (urine) clear or pale yellow. °· Exercise on a regular basis, or as told by your doctor. °· Go to the restroom when you feel like you need to poop. Do not hold it. °· Only take medicine as told by your doctor. Do not take medicines that help you poop (laxatives) without talking to your doctor first. °GET HELP RIGHT AWAY IF:  °· You have bright red blood in your poop (stool). °· Your constipation lasts more than 4 days or gets worse. °· You have belly (abdominal) or butt (rectal) pain. °· You have thin poop (as thin as a pencil). °· You lose weight, and it cannot be explained. °MAKE SURE YOU:  °· Understand these instructions. °·   Will watch your condition.  Will get help right away if you are not doing well or get worse.   This information is not intended to replace advice given to you by your health care provider. Make sure you discuss any questions you have with your health care provider.   Continue taking your home MiraLAX. Take stool softeners as prescribed. Follow-up with your primary care doctor in 1 week for reevaluation. Return to the ED if you experience severe worsening of your pain, blood in your stool, vomiting,  fevers, chills, burning with urination.

## 2016-05-09 DIAGNOSIS — M549 Dorsalgia, unspecified: Secondary | ICD-10-CM | POA: Diagnosis not present

## 2016-05-09 DIAGNOSIS — K5900 Constipation, unspecified: Secondary | ICD-10-CM | POA: Diagnosis not present

## 2016-05-13 DIAGNOSIS — R1314 Dysphagia, pharyngoesophageal phase: Secondary | ICD-10-CM | POA: Diagnosis not present

## 2016-05-13 DIAGNOSIS — R634 Abnormal weight loss: Secondary | ICD-10-CM | POA: Diagnosis not present

## 2016-05-16 ENCOUNTER — Other Ambulatory Visit (HOSPITAL_COMMUNITY): Payer: Self-pay

## 2016-05-16 DIAGNOSIS — R1310 Dysphagia, unspecified: Secondary | ICD-10-CM

## 2016-05-22 ENCOUNTER — Ambulatory Visit (HOSPITAL_COMMUNITY)
Admission: RE | Admit: 2016-05-22 | Discharge: 2016-05-22 | Disposition: A | Discharge status: Home / Self Care | Payer: Medicare Other | Source: Ambulatory Visit | Attending: Otolaryngology | Admitting: Otolaryngology

## 2016-05-22 DIAGNOSIS — R1310 Dysphagia, unspecified: Secondary | ICD-10-CM | POA: Diagnosis not present

## 2016-05-28 ENCOUNTER — Ambulatory Visit (INDEPENDENT_AMBULATORY_CARE_PROVIDER_SITE_OTHER): Payer: Medicare Other | Admitting: Pulmonary Disease

## 2016-05-28 ENCOUNTER — Encounter: Payer: Self-pay | Admitting: Pulmonary Disease

## 2016-05-28 VITALS — BP 112/58 | HR 68 | Temp 97.8°F | Ht 62.0 in | Wt 120.1 lb

## 2016-05-28 DIAGNOSIS — R918 Other nonspecific abnormal finding of lung field: Secondary | ICD-10-CM | POA: Diagnosis not present

## 2016-05-28 DIAGNOSIS — R6889 Other general symptoms and signs: Secondary | ICD-10-CM

## 2016-05-28 DIAGNOSIS — J410 Simple chronic bronchitis: Secondary | ICD-10-CM

## 2016-05-28 DIAGNOSIS — K224 Dyskinesia of esophagus: Secondary | ICD-10-CM

## 2016-05-28 DIAGNOSIS — F418 Other specified anxiety disorders: Secondary | ICD-10-CM

## 2016-05-28 DIAGNOSIS — J387 Other diseases of larynx: Secondary | ICD-10-CM

## 2016-05-28 DIAGNOSIS — K219 Gastro-esophageal reflux disease without esophagitis: Secondary | ICD-10-CM

## 2016-05-28 DIAGNOSIS — K449 Diaphragmatic hernia without obstruction or gangrene: Secondary | ICD-10-CM

## 2016-05-28 DIAGNOSIS — R4589 Other symptoms and signs involving emotional state: Secondary | ICD-10-CM | POA: Insufficient documentation

## 2016-05-28 NOTE — Patient Instructions (Signed)
Today we updated your med list in our EPIC system...    Continue your current medications the same...  Continue your Flonase & Mucinex...  Get an abdominal binder to help your back and the swelling on your left side...  Continue your meds from [place], Dr[facility]wards, and Dr[facility]...  Let's plan a follow up visit in 19months.Marland KitchenMarland Kitchen

## 2016-06-10 DIAGNOSIS — R1084 Generalized abdominal pain: Secondary | ICD-10-CM | POA: Diagnosis not present

## 2016-06-10 DIAGNOSIS — K5900 Constipation, unspecified: Secondary | ICD-10-CM | POA: Diagnosis not present

## 2016-06-10 DIAGNOSIS — K224 Dyskinesia of esophagus: Secondary | ICD-10-CM | POA: Diagnosis not present

## 2016-07-02 DIAGNOSIS — I872 Venous insufficiency (chronic) (peripheral): Secondary | ICD-10-CM | POA: Diagnosis not present

## 2016-07-07 DIAGNOSIS — M19041 Primary osteoarthritis, right hand: Secondary | ICD-10-CM | POA: Diagnosis not present

## 2016-07-10 DIAGNOSIS — K219 Gastro-esophageal reflux disease without esophagitis: Secondary | ICD-10-CM | POA: Diagnosis not present

## 2016-07-10 DIAGNOSIS — C50912 Malignant neoplasm of unspecified site of left female breast: Secondary | ICD-10-CM | POA: Diagnosis not present

## 2016-07-10 DIAGNOSIS — E46 Unspecified protein-calorie malnutrition: Secondary | ICD-10-CM | POA: Diagnosis not present

## 2016-07-10 DIAGNOSIS — F419 Anxiety disorder, unspecified: Secondary | ICD-10-CM | POA: Diagnosis not present

## 2016-07-10 DIAGNOSIS — N183 Chronic kidney disease, stage 3 (moderate): Secondary | ICD-10-CM | POA: Diagnosis not present

## 2016-07-10 DIAGNOSIS — R350 Frequency of micturition: Secondary | ICD-10-CM | POA: Diagnosis not present

## 2016-07-10 DIAGNOSIS — I5030 Unspecified diastolic (congestive) heart failure: Secondary | ICD-10-CM | POA: Diagnosis not present

## 2016-07-10 DIAGNOSIS — I872 Venous insufficiency (chronic) (peripheral): Secondary | ICD-10-CM | POA: Diagnosis not present

## 2016-07-10 DIAGNOSIS — I10 Essential (primary) hypertension: Secondary | ICD-10-CM | POA: Diagnosis not present

## 2016-07-10 DIAGNOSIS — R7303 Prediabetes: Secondary | ICD-10-CM | POA: Diagnosis not present

## 2016-07-16 DIAGNOSIS — R3129 Other microscopic hematuria: Secondary | ICD-10-CM | POA: Diagnosis not present

## 2016-07-23 NOTE — Progress Notes (Signed)
Subjective:    Patient ID: Heather [name], female    DOB: April 22, 1918, 82 y.o.   MRN: [id number]  HPI 82 y/o BF here for a follow up visit... she has multiple medical problems including:  Dyspnea & Anxiety;  HBP;  Hx CP followed by DrBensimhon;  HH/ Gastritis;  Divertics/ Constip;  Functional Abd Pain;  Renal cyst followed by DrKimbrough;  Hx left breast [phone];  DJD/ CSpine spondy/ LBP/ CTS; and she has a cancer phobia...   ~  SEE PREV EPIC NOTES FOR OLDER DATA >> She has established Primary Care follow up w/ DrJohn => DrHusain, Leodis Binet...   CXR 10/14 showed normal heart size w/ tortuous atherosclerotic Ao, clear lungs w/ ?vague opac left mid zone adjacent to hilum (nothing seen in this area on CT 3/14) ?overlying shadows & we will f/u...  Ambulatory O2 check 10/14> O2 sat= 98% on room air at rest w/ pulse=83, regular; Ambulated 3 laps w/ lowest O2 sat= 96% w/ pulse=108...  LABS 10/14:  Chems- wnl;  CBC- wnl;  TSH=2.04;  VitD=71;  Sed=11...  ~  Heather [name] has established w/ DrJohn for Primary care and has seen him x3 already... She still sees DrCrosslry for ENT and DrStark for GI>>  Baseline CXRs showed sl overinflation, clear lungs, normal heart size w/ elongation & tortuous Ao...  Prev CT Chest/ CTA 3/14 showed no PE, no acute findings, +atherosclerotic changes, several scattered tiny nodules 3-32mm size & no change from 2009 scan, s/p left mastectomy, DJD Tspine...   PFTs 9/13 were WNL> FVC=2.62 (128%), FEV1=2.20 (158%), %1sec=84; mid-flows=274% of predicted  CXR 04/04/14 shows stable heart size, atherosclerotic & tortuous Ao, mild hyperinflation, clear lungs w/o acute changes, DJD Tspine...   PFT 07/03/14> despite mult attempts w/ several test administrators/ staff- she was unable to perform the procedure due to inability to cooperate...  Ambulatory O2sat Test 07/03/14 on RA>  Baseline O2sat= 99% w/ pulse=86;  After 3 laps her nadir O2sat= 96% w/ max pulse= 114...  ~   September 21, 2014:  26mo ROV & pt added-on today at her request for mult symptoms> but on questioning it is apparent that her symptomatology is chronic w/o acute features; she notes cough ("hocking cough"), thick white sputum ("with white balls"), chest congestion; but she denies to me any f/c/s, CP, hemoptysis, etc and she notes that "my breathing is good";;;  Then she launches into a litany of complaints- poor appetite, wt loss ("I'm nothing but a frame"), back pain ("the drawing kind"), and general flight of ideas;  Meds reviewed & include prev ZPak & Mucinex (?taking 1 Qid);  Exam is neg w/ clear chest- no wheezing, rales, or rhonchi hear;  Last CXR was 5/15 showing essentially clear lungs, NAD...    She saw Cherly Hensen for Cards 9/15> Hx HBP, CHF/ DD, AtypCP, RBBB, neg Myoview 2009; on Coreg3.125Bid, Amlod2.5, Lasix40 as needed for swelling; BP= 112/60 today...     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     Hx mult Ortho complaints and LBP evaluated by DrWhitfield w/ Epid shots per DrNewton but she claims no improvement; notes Tramadol doesn't help and want Vicodin refilled, she wears a back brace...     Very anxious and has Celexa10 & Alprazolam 0.5mg  on her med list...   EKG 9/15 showed NSR, rate81, LAD, RBBB, NSSTTWA, NAD...  PLAN>>  Heather [name] is reassured- her breathing is stable w/o acute lung problems at this time; she is asked to continue the Mucinex600mg  Qid w/ fluids, and remain as active as possible; reminded to use her ProtonixBid & follow antireflux regimen to help her LPR;  She will f/u w/ her other physicians and see me in 6 months for re-evaluation & f/u CXR...  ~  Mar 22, 2015:  67mo ROV & Heather [name] says "I'm just skin & bones" weight is noted to be down 15# to 120# today (BMI=22);  She says that her dentist noted her teeth are  wearing out & this may acct for some wt loss- needs to incr nutritional supplement like BOOST Bid;  She persists w/ mult medical complaints- "this swallowing business" and she is seeing both [facility] and [facility] for help w/ reflux and cancer phobia "it can show up anywhere";  From the resp standpoint she notes same "hocking" throat clearing cough w/ thick, white, foamy sput; she notes SOB/DOE w/o change and she is still not taking the Alpraz or the Mucinex as requested...      She saw DrJohn 1/16> note reviewed & felt to be stable... She tells me she has switched Primary Care to [facility]...      She saw [facility] 1/16 and they felt no further GI w/u warranted, use abd binder prn...      She saw DrEdwards, GI 2/16> bloating, constip, wt loss> note reviewed EXAM reveals Afeb, VSS, O2sat=99% on RA;  HEENT- neg x sl dry MMs;  Chest- she has a forced dry hacking cough that is nonproductive, clear w/o w/r/r;  Heart- RR w/o m/r/g heard;  Abd- soft, nontender, neg...  We reviewed prob list, meds, xrays and labs> see below for updates >>   LABS 11/2014 were WNL.Marland KitchenMarland Kitchen  CXR 03/22/15 showed stable cardiomeg, chronic changes, old right rib fxs, NAD...  PLAN>>  Please [name] take the Mucinex600mg - one tab Qid w/ fluids, and try the Alprazolam 0.5mg  1/2 tab Tid to start; I have tried to reassure her as best I can, she needs to eat better & take the nutritional supplement Bid...  (NOTE> I spent 9min face-to-face w/ pt discussing her symptoms, examining pt, reviewing recommendations, and answering questions)  ~  July 16, 2015:  66mo ROV & Heather [name] presents w/ her usual litany of somatic complaints- "terrible" she says; c/o feeling hot, eyes run water, had dental work done, still w/ mult GI issues, unhappy w/ PCP, etc... She saw DrEdwards for GI eval & his note is reviewed; she went to the ER 05/17/15 w/ GI complaints and the ER physician ordered a CT Abd & Pelvis> a few tiny 3-52mm nodules were noted in the lateral aspect of the  RML, the abd revealed aortoiliac calcif w/o aneurysm, right renal cyst, otherw no signif abn noted (Radiology rec f/u CT Chest in 35yr); she is very concerned and I indicated that I thought this was prob some scar tissue but we would recheck her CXR in 59mo & follow up the CT in 31yr...     AB> she c/o cough "hoking little white balls"; baseline CXRs have been clear (old right rib fxs), PFT in 2013 was wnl, oxygenation is wnl, and exam is clear; she has been asked to use Mucinex Qid but she won't do it...    Abn CT scan w/ scat pulm nodules> prev CT Chest w/ tiny left apical oapc, unchanged serially & felt to be benign; CT Abd done 05/17/15 w/  tiny 3-49mm RML nodules detected, otherw eg & we discussed f/u CXR in 21mo & CT Chest in 66yr.     HBP> on Coreg3.125Bid, Lasix40, off Amlod; BP= 106/60 & she persists w/ mult somatic complaints; last 2DEcho 10/2009 showed norm LV size & func w/ EF=55-60%, mildAI....    HxCP> on ASA81; followed by DrBensimhon/ DrNishan for Cards & their prev notes are reviewed=> neg ischemic work up previously...     VI, Edema> she went to the ER 11/13 w/ edema; VDopplers were neg for DVT; they reiterated the recs for no salt, elevation, support hose, & continue Lasix40.     GI- HH, Gastirits, Divertics, chronic pain & "swelling"> on Protonix40Bid plus Zantac, Miralax & Senakot-S, etc; she persists w/ mult GI complaints despite prev evals by [facility], [facility], [facility]...    Renal cyst> she is very anxious about everything & has a cancer phobia; DrKimbrough followed this benign simple cyst for yrs- no change...     Hx Breast cancer> she had left mod radical mastectomy 1994 by DrBlievernicht- no known recurrence & she is given reassurance...     Ortho- DJD, Cspine spondy, LBP, CTS> followed by DrWhitfield & he injected her right knee & her back; on Vicodin prn...    Anxiety> on Alpraz0.5mg Tid but "I'm not taking it very often" she says & encouraged to use more regularly...  We reviewed  prob list, meds, xrays and labs> see below for updates >> her PCP is Artist Beach, Animal nutritionist at [facility]...  Ba swallow 03/23/15 showed espohageal dysmotility w/ full column stasis and tertiart contractions, no stricture detected...  CT Abd & Pelvis 05/17/15 showed a few tiny 3-70mm nodules were noted in the lateral aspect of the RML, the abd revealed aortoiliac calcif w/o aneurysm, right renal cyst, otherw no signif abn noted (Radiology rec f/u CT Chest in 55yr).  LABS 05/17/15 via ER> Chems- wnl;  CBC- wnl... IMP/PLAN>>  I told Heather [name] that the tiny nodules seen in the RML area on CT Abd were likely benign & reassured her that we would follow this area closely w/ CXR in 41mo & CT Chest in 38mo; in the meanwhile she will continue her current meds & maintain follow up w/ her PCP & various specialists as needed...   ~  January 17, 2016:  49mo ROV & Heather [name] notes that her breathing is actually doing very well- SOB at baseline, denies CP, palpit, cough, sput, hemoptysis, etc... However she continues to have a litany of somatic complaints that she enumerates at each visit> she tells me that she has left DrJohn because he didn't know anything else to do & is now seeing Alphonsa Gin at [facility]; he GI is DrEdwards... "I'm noting but skin & bones", "foood don't do me no good", "my appetite is good & I eat soul food", "I know what's going on w/ me" but she didn't elaborate, "some meds don't agree w/ others", last med was $400 she says (from Urology), "there's nothing in my stomach except my inards", she was upset w/ AVS showing diagnosis of anxiety...     AB> she c/o cough "hoking little white balls"; baseline CXRs have been clear (old right rib fxs), PFT in 2013 was wnl, oxygenation is wnl, and exam is clear; she has been asked to use Mucinex Qid but she won't do it...    Abn CT scan w/ scat pulm nodules> prev CT Chest w/ tiny left apical opac, unchanged serially & felt to be benign; CT Abd done 05/17/15 w/  tiny 3-32mm RML  nodules detected, otherw eg & we discussed f/u CXR in 35mo & CT Chest in 4yr.     HxCP> on ASA81; followed by DrBensimhon/ DrNishan for Cards & their prev notes are reviewed=> neg ischemic work up previously...     GI- HH, Gastirits, Divertics, chronic pain & "swelling"> on Protonix40Bid plus Zantac, Miralax & Senakot-S, etc; she persists w/ mult GI complaints despite prev evals by [facility], [facility], [facility]; most recent testing 09/2015 by DrJEdwards- see imaging studies below;  ENT eval by DrJRosen...    Mult Medical Issues> now followed by PCP DrHusain EXAM reveals Afeb, VSS, O2sat=99% on RA;  HEENT- neg x sl dry MMs;  Chest- she has a forced dry hacking cough that is nonproductive, clear w/o w/r/r;  Heart- RR w/o m/r/g heard;  Abd- soft, nontender, neg...   EKG 08/2015>  NSR, rate74, RBBB, otherw neg ekg...  CXR 09/2105>  Borderline heart size, tortuous Ao, clear sl hyperexpanded lungs, no adenopathy- NAD...   LABS in Epic 09/2015>  Chems- wnl;  CBC- wnl  Abd Sonar 11/2015> heterogeneous liver texture- rec MRI for further eval...  MRI Abd 11/2015> no suspicious hepatic lesions, incidental tiny cyst noted, otherw MRI of Abd was neg...   MBS 11/2015> mod oral phase dysphagia, see full report IMP/PLAN>>  Heather [name] is stable from the pulm standpoint;  She has mult somatic complaints & a large physician contingent attending to her various symptoms;  I suggested to her that she would be best served by taking the Xanax regularly 0.5mg - 1/2 to 1 tab Tid regularly...   ~  May 28, 2016:  19mo ROV & Heather [name]'s breathing is at baseline but she complained to her PCP about DOE w/ walking (uses walker) & he told her "I better get it checked out";  Today c/o "I'm just a frame- all skin & bones", "I stay hungry", "I eat soul food & I can't eat no more", "they've done a lot of tests and can't find nothing"; rambling hx & flight of ideas- she is c/o dental issues, ribs sticking out, & still c/o a knot in her  left flank area-- I reminded her to be sure to mention all this to her GI Oletta Lamas), Ortho Durward Fortes) & PCP Lysle Rubens) so he can check this out;  We talked about her abd/ back/ legs/ skin/ etc, she wants a stong pain pill, she says she's lonely at 15- "all my friend's are gone"...    From the pulmonary standpoint-- she c/o being winded after long walks (uses walke- note: she had outpt PT 01/2016), cough w/ little white balls, etc;  Meds include Flonase Qhs and Mucinex600Bid w/ fluids;  Exam is clear & XRay/Labs in the last yr all wnl...  EXAM reveals Afeb, VSS, O2sat=98% on RA;  HEENT- neg x sl dry MMs;  Chest- she has a forced dry hacking cough that is nonproductive, clear w/o w/r/r;  Heart- RR w/o m/r/g heard;  Abd- soft, nontender, neg...   Art Dopplers of LEs 03/31/16>  Normal ABIs & no evid of arterial occlusive dis  LABS 04/2016 in epic>  Chems- wnl;  CBC- wnl  CT Abd & Pelvis 05/02/16>  Mod stool burden in right colon, no obstruction, NAD (she was seen in the Er for abd pain)  She had Speech Path swallowing eval 05/22/16>  Note reviewed- mild oral phase & pharyngeal dysphagia, no aspiration or penetration, esoph dysmotility; recommendations given to pt...  IMP/PLAN>>  Heather [name] appears stable- chest is clear, oxygenation  excellent, and she presents w/ the same mult somatic complaints; she still won't try the Xanax as I've suggested; f/u w/ me as needed...           Problem List:  RESPIRATORY SYMPTOMS >> c/o "hocking phlegm" DYSPNEA (ICD-786.9) - eval 5/11 c/o SOB- can't get a DB, can't get enough air in, etc... occurs at rest & w/ exerc "I'm very active despite my arthritis", talks rapidly in long sentences w/o apparent distress... no cough, phlegm, hemoptysis, etc- but as usual she is quite distressed by these symptoms and wants further eval> we discussed re-assessment w/ CXR (clear, NAD);  PFT (totally norm airflow);  Labs (all WNL);  Rx w/ ALPRAZOLAM 0.5mg  Tid & encouraged to take regularly. ~   CXR 4/13 showed norm heart size, clear lungs, mild TSpine spondylosis... ~  She continues to complain of drainage, throat symptoms, etc; Rx w/ Mucinex, OTC antihist, Nasonex, etc;  Seen by ENT, DrRosen w/ ?xerostomia & rec incr water etc. ~  9/13: Add-on visit for "hocking, it's not a cough, it's a hock"> see above... ~  9/13: CXR shows sl overinflation, clear lungs, normal heart size w/ elongation & tortuous Ao;  PFTs are wnl> FVC=2.62 (128%), FEV1=2.20 (158%), %1sec=84; mid-flows=274% of predicted... We discussed trial rx for her symptoms- Mucinex, Fluids, Hycodan, Dulera100- 1puffbid. ~  10/13: she reports INTOL to [facility] w/ throat & mouth symptoms "like glue" therefore stopped this med; advised to continue the others. ~  3/14:  CT Chest (done after call from Ortho PA regarding her symptoms)=> then CTA (done via an ER visit) showed no PE, no acute findings, +atherosclerotic changes, several scattered tiny nodules 3-40mm size & no change from 2009 scan, s/p left mastectomy, DJD Tspine...  ~  4/14: she is asked to increase the Mucinex600mg  to [facility] + plenty of water daily... ~  10/14: CXR showed normal heart size w/ tortuous atherosclerotic Ao, clear lungs w/ ?vague opac left mid zone adjacent to hilum (nothing seen in this area on CT 3/14) ?overlying shadows & we will f/u...  ~  CXR 04/04/14 shows stable heart size, atherosclerotic & tortuous Ao, mild hyperinflation, clear lungs w/o acute changes, DJD Tspine... ~  8/15 & 11/15> she remains stable from the pulmonary standpoint on Mucinex600Qid + Fluids; rec to follow better antireflux regimen for her cough, LPR, choking...  ~  CXR 03/22/15 showed stable cardiomeg, chronic changes, old right rib fxs, NAD ~  She went to the ER 05/17/15 w/ GI complaints and the ER physician ordered a CT Abd & Pelvis> a few tiny 3-55mm nodules were noted in the lateral aspect of the RML, the abd revealed aortoiliac calcif w/o aneurysm, right renal cyst, otherw no signif abn noted  (Radiology rec f/u CT Chest in 75yr); she is very concerned and I indicated that I thought this was prob some scar tissue but we would recheck her CXR in 64mo & follow up the CT in 106yr.Heather [name] HAS ESTABLISHED w/ [facility]HUSAIN [facility] [place] (prev DrJohn at [facility])  HYPERTENSION (ICD-401.9) -  ~  on AMLODIPINE 2.5mg /d,  COREG 3.125mg Bid,  LASIX 40mg - ?taking 1/2tab/d or just using it prn swelling... ~  8/16: her meds have been adjusted> on Coreg3.125Bid, Lasix40, off Amlod; BP= 106/60 & she persists w/ mult somatic complaints; last 2DEcho 10/2009 showed norm LV size & func w/ EF=55-60%, mildAI.  Hx of CHEST PAIN (ICD-786.50) - she takes ASA 81mg /d + Tylenol Prn... followed by DrBensimhon for Cards. ~  Baseline EKG w/ RBBB & LAD, no acute changes... ~  NuclearStressTest done 2007 & 2009 showed normal- without scar or ischemia, EF=67%...   ~  2DEcho 7/08 showed mild AI, mild diastolic dysfunction, [id number]. ~  CT Abd 1/09 & 9/12 showed incidental coronary calcif, & aorto-iliac atherosclerotic changes as well... ~  8/13: she saw DrNishan> HBP, DD, atypCP w/ neg Myoviews & no changes made... ~  10/13:  She uses a back brace/ abd binder to help her CWP... ~  EKG 9/15 showed NSR, rate81, LAD, RBBB, NSSTTWA, NAD...  VENOUS INSUFFICIENCY (ICD-459.81) - she has mod VI and follows a low sodium diet, elevates legs when able, and wears support hose. ~  7/12:  C/o incr edema & we reviewed a 2gm sodium diet & increased her Lasix from 20mg /d to 40mg /d... ~  5/13:  She has persist complaints but mild chr VI w/ min edema & reminded to elim sodium, elev, support hose, take the Lasix... ~ 11/13: she went to the ER w/ edema; VDopplers were neg for DVT; they reiterated the recs for no salt, elevation, support hose, & continue Lasix40. ~  She has persistent edema in LEs and rec to avoid sodium, elev legs, wear support hose...  HIATAL HERNIA (ICD-553.3) & GASTRITIS (ICD-535.50) >>  ~  on OMEPRAZOLE 20mg /d &  followed by DrJEdwards for GI having fired DrStark & [facility] in past. ~  last EGD was 2/08 w/ 3cm HH & gastitis... ~  CT Abd 1/09 revealed calcif gallstones, & mild ectasia of AA... ~  CT Abd 1/11 showed left colon divertics, tiny hepatic lesions w/o change & right renal cyst stable. ~  CT Abd 9/12 showed divertics (no inflamm), right renal & left hep lobe cysts, atherosclerotic changes, NAD... ~  Seen by DrEdwards 4/13> c/o dysphagia & congestion; he notes mult GI complaints, known esoph dysmotility w/o stricture, hard to pin down her complaints... ~  She has followed up w/ DrStark regarding her concerns about swelling in her abd... ~  CT Abd Pelvis 3/14 showed Tiny cysts in the lateral segment left hepatic lobe, 1.4 cm right upper pole renal cyst, atherosclerotic calcifications of the abdominal aorta and branch vessels, DJD spine... ~  2015: now on Protonix40Bid but not taking regularly & advised BID dosing & antireflux regimen for her LPR...   DIVERTICULOSIS, COLON (ICD-562.10) - followed regularly by DrStark. ~  colonoscopy 7/03 by DrStark showing divertics only... we discussed Miralax + Senakot-S for constipation. ~  colonoscopy 2/11 showed divertics, 65mm polyp, hems...  ? of ABDOMINAL PAIN & SYMPTOMATOLOGY - she had a very thorough work up in 2009> she has a cancer phobia and is very difficult to reassure her that everything appropriate has been done & no cancer found... Mult additional GI evals since then from [facility], [facility] (DrEdwards), [facility] (DrThorne)... ~  eval by GI- DrStark 12/08 but pt wasn't satisfied w/ his examination... ~  full labs 1/09 were WNL, sed=20... ~  CT Abd/ Pelvis 1/09= no acute abn in abd (calcif gallstones & mild ectasia of AA), calcif fibroid in the pelvis, & ?regarding the left ovary. ~  referral to DrFontaine, GYN> his notes are reviewed... ~  follow up w/ DrKimbrough, Urology> his notes are reviewed... ~  eval DrWhitfield for Orthopedics- rec shots in back by  [facility] w/ some improvement. ~  repeat GI eval 1/11 by DrStark w/ CT & colonoscpy as above- no acute problems. ~  5/11 & 7/11 >> persist complaints w/ neg  recheck by DrStark & DrKimbrough... ~  12/11:  routine GYN surveillance DrFontaine found anteverted uterus w/ fibroids, atrophic ovaries bilat, no mass. ~  Repeat GI eval 4/12 by DrStark> he tried to reassure the pt, felt some symptoms might be from poor abd wall muscle tone, rec continue Omep, laxatives... ~  6/12:  She sought another GI opinion DrEdwards> he did not feel she needed any additional testing... ~  She tells me she has yet another appt to see a gastroenterologist at [facility]==> seen by DrThorne 8/12 & note reviewed... ~  EGD by DrThorne 9/12 w/ mult whitish nodular plaques in cardia & fundus> Bx results pending & she is very concerned (remember her hx cancer phobia). ~  She continues to f/u w/ [facility] at [facility] & is happy w/ her eval... ~  Prev back w/ DrStark at [facility] => then switched to DrEdwards at [facility], GI.Marland Kitchen.  ~  CT Abd & Pelvis 05/17/15 via ER showed a few tiny 3-80mm nodules were noted in the lateral aspect of the RML, the abd revealed aortoiliac calcif w/o aneurysm, right renal cyst, otherw no signif abn noted (Radiology rec f/u CT Chest in 52yr).  RENAL CYST (ICD-593.2) - she has a 1 cm right renal cyst seen on CT in 2007 and followed by DrKimbrough... f/u CT Abd 1/09 - no change in the cyst... f/u renal ultrasound 1/10 w/ small bilat cysts... f/u CT Abd 1/11- no change in cyst. ~  she saw DrKimbrough 7/11 for LBP, microhematuria, urge incontinence- stable, no additional Rx. ~  she saw DrKimbrough 1/12 for urge incontinence, freq, nocturia> rec Desmopressin 0.2mg - 1/2 tab Qhs. ~  No change in the renal cyst over the yrs...  CARCINOMA, BREAST, LEFT (ICD-174.9) - Surgery 9/94 by DrBlievernicht w/ Left modified radical mastectomy... +estrogen receptors and Rx w/ Tamoxifen... f/u mammograms all neg... breast exam on right has been  neg- no nodules palpated... She says that DrBertrand insisted that her PCP check her right breast at each & every office visit to be sure she doesn't develop any nodules...  DEGENERATIVE JOINT DISEASE (ICD-715.90) - on OTC meds + VICODIN Prn... she has end-stage OA of Right Knee per DrWhitfield treated w/ shots in the past... she has a knee brace and a cane for ambulation... ~  8/13:  She wants a better pain pill & we discussed change to [place] prn... ~  12/13: she tells me that DrWhitfield injected her knee last wk- improved...  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 Rx w/ rest, heat, Percocet, and f/u w/ Ortho...  Hx of LOW BACK PAIN, CHRONIC (ICD-724.2) - eval DrWhitfield w/ rec for shots by DrNewton & she's feeling sl better & wears back brace... ~  5/10: mult somatic complaints- primarily c/o left side/ postero-lat rib pain...  Exam w/ tender left lat/ post ribs... Bone Scan= neg... ~  MRI Lumbar spine 7/10 by DrWhitfield... ~  Epidural steroid shot by DrNewton 8/10 & 1/11 ~  Repeat LBP eval by DrWhitfield w/ another MRI lumbar spine showing degenerative changes w/ mild progression since 7/10 MRI, mild-mod sp stenosis & lat recess stenosis at several levels ~  DrWhitfield referred her to [facility] to consider more shots but she is not so inclined... ~  6/13: she saw DrTooke w/ lumbar spondylosis; he felt that he had nothing to offer her & rec incr Tramadol to Qid... ~  2014:  She has had  numerous visits w/ DrWhitfield/ Ernest[facility] Patches for injections, XRays, scans etc...  CARPAL TUNNEL SYNDROME, BILATERAL (ICD-354.0) - she had prev right carpal tunnel release by DrWhitfield and was c/o increasing discomfort in her left wrist despite wrist splint Qhs- had left carpal tunnel release 1/10 by DrWhitfield...  ANXIETY (ICD-300.00) - on ALPRAZOLAM 0.5mg  Tid & encouraged to take it regularly... she has a cancer phobia &  it is very difficult to reassure her regarding her symptoms and our evaluations... ~  7/13: she went to the ER c/o swelling of the tongue but eval by EDP indicated that her tongue was normal & not swollen, she was not on an ACE, given Pred Rx anyway. ~  She has been asked to incr the ALprazolam to regular dosing but she is reluctant...   Past Surgical History:  Procedure Laterality Date  . CARPAL TUNNEL RELEASE    . Left mastectomy      Outpatient Encounter Prescriptions as of 05/28/2016  Medication Sig Dispense Refill  . ALPRAZolam (XANAX) 0.5 MG tablet Take 0.25-0.5 mg by mouth 3 (three) times daily as needed for anxiety or sleep.     . carvedilol (COREG) 3.125 MG tablet Take 1 tablet (3.125 mg total) by mouth 2 (two) times daily. 180 tablet 3  . Cholecalciferol (VITAMIN D3) 1000 UNITS CAPS Take 1,000 Units by mouth daily. For your bones    . citalopram (CELEXA) 10 MG tablet Take 1 tablet (10 mg total) by mouth daily. 90 tablet 3  . docusate sodium (COLACE) 100 MG capsule Take 1 capsule (100 mg total) by mouth every 12 (twelve) hours. 60 capsule 0  . fluticasone (FLONASE) 50 MCG/ACT nasal spray Place 2 sprays into both nostrils daily.    . furosemide (LASIX) 40 MG tablet Take 40 mg by mouth daily.     Marland Kitchen guaiFENesin (MUCINEX) 600 MG 12 hr tablet Take 600 mg by mouth 2 (two) times daily.    . Multiple Vitamins-Minerals (MULTIVITAMINS THER. W/MINERALS) TABS Take 1 tablet by mouth daily.    Marland Kitchen OVER THE COUNTER MEDICATION Take 8 oz by mouth 2 (two) times daily as needed (for constipation). Patient states she drink about 2 glasses of prune juice a day for constipation    . ranitidine (ZANTAC) 75 MG tablet Take 1 tablet (75 mg total) by mouth 2 (two) times daily. (Patient taking differently: Take 75 mg by mouth 2 (two) times daily as needed for heartburn. ) 28 tablet 0  . pantoprazole (PROTONIX) 40 MG tablet Take 1 tablet (40 mg total) by mouth 2 (two) times daily. (Patient not taking: Reported on  05/02/2016) 60 tablet 11   No facility-administered encounter medications on file as of 05/28/2016.     Allergies  Allergen Reactions  . Fentanyl     REACTION: nausea - pt states that she is not allergic     Current Medications, Allergies, Past Medical History, Past Surgical History, Family History, and Social History were reviewed in [employer] record.    Review of Systems       See HPI - all other systems neg except as noted... She has mult somatic complaints and anxiety. The patient complains of decreased hearing, chest discomfort, dyspnea on exertion, abdominal pain, incontinence, muscle weakness, and difficulty walking.  The patient denies anorexia, fever, vision loss, hoarseness, syncope, peripheral edema, prolonged cough, headaches, hemoptysis, melena, hematochezia, severe indigestion/heartburn, hematuria, transient blindness, depression, abnormal bleeding, enlarged lymph nodes, and angioedema.     Objective:   Physical Exam  WD, WN, 82 y/o BF in NAD... she is very anxious... GENERAL:  Alert & oriented; pleasant & cooperative... HEENT:  [place]/AT, EOM-wnl, PERRLA, EACs-clear, TMs-wnl, NOSE-clear, THROAT-clear & wnl, no lesions seen. NECK:  Neck ROM decreased, no JVD; normal carotid impulses w/o bruits; no thyromegaly or nodules palpated; no lymphadenopathy. CHEST:  Clear to P & A; without wheezes/ rales/ or rhonchi heard... BREAST:  s/p left mastectomy... right breast normal- without nodules palpated... HEART:  Regular Rhythm; without murmurs/ rubs/ or gallops detected... ABDOMEN:  Soft & nontender; normal bowel sounds; no organomegaly or masses palpated... EXT: without deformities, mod arthritic changes; no varicose veins/ +venous insuffic/ tr edema. NEURO:  CN's intact; no focal neuro deficits... DERM:  Small sl excoriated area of dermatitis left shin- no drainage,no rash, etc...  RADIOLOGY DATA:  Reviewed in the EPIC EMR & discussed w/ the  patient...  LABORATORY DATA:  Reviewed in the EPIC EMR & discussed w/ the patient...   Assessment & Plan:    RESPIRATORY SYMPTOMS w/ Dyspnea & throat clearing, prob LPR>  She had a neg pulm eval 2011 and numerous times thereafter; she insisted on additional eval 9/13 for her chr throat clearing symptom; CXR w/o acute changes & PFTs normal for her 82 y/o lungs; she is quite insistent on med rx for her problem but has not had relief from anything; REC to take [id number], Fluids, DELSYM prn; pt very anxious & Alprazolam seems to help when she takes it... 11/15> she has been taking the OTC MUCINEX 600mg  Qid w/ fluids and seems sl improved; rec strict antireflux regimen w/ Protonix before dinner, NPO after dinner, elev HOB 6" blocks... 5/16 - 8/16> she is once again off all these meds and c/o incr symptoms; rec to restart the Mucinex, fluids, and antireflux regimen... 01/17/16>   Heather [name] is stable from the pulm standpoint;  She has mult somatic complaints & a large physician contingent attending to her various symptoms;  I suggested to her that she would be best served by taking the Xanax regularly 0.5mg - 1/2 to 1 tab Tid regularly (she never did). 05/28/16>   Heather [name] appears stable- chest is clear, oxygenation excellent, and she presents w/ the same mult somatic complaints; she still won't try the Xanax as I've suggested; f/u w/ me as needed...   Abn CT Abd w/ several tiny nodules reported in the RML area>  We discussed this & decided to f/u CXR in 25mo & CT Chest in 53yr...   Hx CP> evaluated for Cards by DrBensimon & he has not been in favor of invasive testing; stable on conservative med rx...  GI> HH, Gastritis, Divertics, vague abd pain>  I tried once again to reassure her about the thorough GI evaluations that her has undergone; she is happy w/ DrThorne's eval & will f/u w/ her & in the meanwhile continue rx w/ [id number], Miralax, Metamucil, Senakot, Mylicon, etc... She has re-established w/  [facility]- their notes reviewed...  Hx left Breast Cancer>  No known recurrence...  Anxiety>  Asked once again to increase the Alprazolam to 1/2 - 1 tab TID regularly...   Patient's Medications  New Prescriptions   No medications on file  Previous Medications   ALPRAZOLAM (XANAX) 0.5 MG TABLET    Take 0.25-0.5 mg by mouth 3 (three) times daily as needed for anxiety or sleep.    CARVEDILOL (COREG) 3.125 MG TABLET    Take 1 tablet (3.125 mg total) by mouth 2 (two) times daily.   CHOLECALCIFEROL (VITAMIN  D3) 1000 UNITS CAPS    Take 1,000 Units by mouth daily. For your bones   CITALOPRAM (CELEXA) 10 MG TABLET    Take 1 tablet (10 mg total) by mouth daily.   DOCUSATE SODIUM (COLACE) 100 MG CAPSULE    Take 1 capsule (100 mg total) by mouth every 12 (twelve) hours.   FLUTICASONE (FLONASE) 50 MCG/ACT NASAL SPRAY    Place 2 sprays into both nostrils daily.   FUROSEMIDE (LASIX) 40 MG TABLET    Take 40 mg by mouth daily.    GUAIFENESIN (MUCINEX) 600 MG 12 HR TABLET    Take 600 mg by mouth 2 (two) times daily.   MULTIPLE VITAMINS-MINERALS (MULTIVITAMINS THER. W/MINERALS) TABS    Take 1 tablet by mouth daily.   OVER THE COUNTER MEDICATION    Take 8 oz by mouth 2 (two) times daily as needed (for constipation). Patient states she drink about 2 glasses of prune juice a day for constipation   PANTOPRAZOLE (PROTONIX) 40 MG TABLET    Take 1 tablet (40 mg total) by mouth 2 (two) times daily.   RANITIDINE (ZANTAC) 75 MG TABLET    Take 1 tablet (75 mg total) by mouth 2 (two) times daily.  Modified Medications   No medications on file  Discontinued Medications   No medications on file

## 2016-07-24 DIAGNOSIS — N3281 Overactive bladder: Secondary | ICD-10-CM | POA: Diagnosis not present

## 2016-07-25 DIAGNOSIS — R5382 Chronic fatigue, unspecified: Secondary | ICD-10-CM | POA: Diagnosis not present

## 2016-07-25 DIAGNOSIS — I5030 Unspecified diastolic (congestive) heart failure: Secondary | ICD-10-CM | POA: Diagnosis not present

## 2016-07-25 DIAGNOSIS — R3981 Functional urinary incontinence: Secondary | ICD-10-CM | POA: Diagnosis not present

## 2016-07-28 ENCOUNTER — Encounter (HOSPITAL_COMMUNITY): Payer: Self-pay | Admitting: Emergency Medicine

## 2016-07-28 ENCOUNTER — Ambulatory Visit: Payer: Medicare Other | Admitting: Pulmonary Disease

## 2016-07-28 ENCOUNTER — Emergency Department (HOSPITAL_COMMUNITY): Payer: Medicare Other

## 2016-07-28 DIAGNOSIS — Y999 Unspecified external cause status: Secondary | ICD-10-CM | POA: Insufficient documentation

## 2016-07-28 DIAGNOSIS — Y92000 Kitchen of unspecified non-institutional (private) residence as  the place of occurrence of the external cause: Secondary | ICD-10-CM | POA: Insufficient documentation

## 2016-07-28 DIAGNOSIS — W19XXXA Unspecified fall, initial encounter: Secondary | ICD-10-CM | POA: Insufficient documentation

## 2016-07-28 DIAGNOSIS — I5030 Unspecified diastolic (congestive) heart failure: Secondary | ICD-10-CM | POA: Insufficient documentation

## 2016-07-28 DIAGNOSIS — I110 Hypertensive heart disease with heart failure: Secondary | ICD-10-CM | POA: Diagnosis not present

## 2016-07-28 DIAGNOSIS — S42132A Displaced fracture of coracoid process, left shoulder, initial encounter for closed fracture: Secondary | ICD-10-CM | POA: Diagnosis not present

## 2016-07-28 DIAGNOSIS — S42022A Displaced fracture of shaft of left clavicle, initial encounter for closed fracture: Secondary | ICD-10-CM | POA: Diagnosis not present

## 2016-07-28 DIAGNOSIS — R930 Abnormal findings on diagnostic imaging of skull and head, not elsewhere classified: Secondary | ICD-10-CM | POA: Insufficient documentation

## 2016-07-28 DIAGNOSIS — S0990XA Unspecified injury of head, initial encounter: Secondary | ICD-10-CM | POA: Diagnosis not present

## 2016-07-28 DIAGNOSIS — S42135A Nondisplaced fracture of coracoid process, left shoulder, initial encounter for closed fracture: Secondary | ICD-10-CM | POA: Diagnosis not present

## 2016-07-28 DIAGNOSIS — Y939 Activity, unspecified: Secondary | ICD-10-CM | POA: Diagnosis not present

## 2016-07-28 DIAGNOSIS — J9811 Atelectasis: Secondary | ICD-10-CM | POA: Diagnosis not present

## 2016-07-28 DIAGNOSIS — S4992XA Unspecified injury of left shoulder and upper arm, initial encounter: Secondary | ICD-10-CM | POA: Diagnosis present

## 2016-07-28 DIAGNOSIS — S42032A Displaced fracture of lateral end of left clavicle, initial encounter for closed fracture: Secondary | ICD-10-CM | POA: Diagnosis not present

## 2016-07-28 DIAGNOSIS — Z853 Personal history of malignant neoplasm of breast: Secondary | ICD-10-CM | POA: Insufficient documentation

## 2016-07-28 NOTE — ED Triage Notes (Addendum)
Pt sts she was in the kitchen and her left side gave out from pain and she fell to the ground on her right side. Pt denies LOC or hitting head. Pt only complain is left shoulder pain. Pt a/o x 4, no neuro symptoms.

## 2016-07-28 NOTE — ED Notes (Addendum)
Received call from Radiology concerning L clavicle fx showing on shoulder XRAY, wanting to know if specific clavicle films are needed. Isaacs MD consulted, no clavicle films however ordered a chest XRAY. Radiology notified.

## 2016-07-29 ENCOUNTER — Emergency Department (HOSPITAL_COMMUNITY)
Admission: EM | Admit: 2016-07-29 | Discharge: 2016-07-29 | Disposition: A | Discharge status: Home / Self Care | Payer: Medicare Other | Attending: Emergency Medicine | Admitting: Emergency Medicine

## 2016-07-29 ENCOUNTER — Emergency Department (HOSPITAL_COMMUNITY): Payer: Medicare Other

## 2016-07-29 DIAGNOSIS — S42132A Displaced fracture of coracoid process, left shoulder, initial encounter for closed fracture: Secondary | ICD-10-CM

## 2016-07-29 DIAGNOSIS — S0990XA Unspecified injury of head, initial encounter: Secondary | ICD-10-CM | POA: Diagnosis not present

## 2016-07-29 DIAGNOSIS — W19XXXA Unspecified fall, initial encounter: Secondary | ICD-10-CM

## 2016-07-29 DIAGNOSIS — S42022A Displaced fracture of shaft of left clavicle, initial encounter for closed fracture: Secondary | ICD-10-CM

## 2016-07-29 DIAGNOSIS — S42032A Displaced fracture of lateral end of left clavicle, initial encounter for closed fracture: Secondary | ICD-10-CM | POA: Diagnosis not present

## 2016-07-29 MED ORDER — ACETAMINOPHEN 325 MG PO TABS
650.0000 mg | ORAL_TABLET | Freq: Once | ORAL | Status: AC
  Administered 2016-07-29: 650 mg via ORAL
  Filled 2016-07-29: qty 2

## 2016-07-29 NOTE — ED Provider Notes (Signed)
[facility] DEPT Provider Note   CSN: [id number] Arrival date & time: 07/28/16  2140     History   Chief Complaint Chief Complaint  Patient presents with  . Fall  . Shoulder Pain    HPI Heather [name] is a 82 y.o. female who presents with left shoulder pain following fall this evening. Patient states she was reaching to open the refrigerator with her right hand and fell backward onto her left shoulder. Patient denies feeling lightheaded or dizzy prior to her fall. She denies hitting her head or losing consciousness. Patient complains only of left shoulder pain. Patient did not take any medicines prior to arrival. Patient denies any chest pain, shortness of breath, dizziness, headache, nausea, vomiting. Patient has chronic lower abdominal pain and urinary frequency for the past 4-5 months that is being worked up by her primary care provider and urology. Patient had a negative UA at her primary care provider's office 4 days ago.  HPI  Past Medical History:  Diagnosis Date  . Allergic rhinitis, cause unspecified 02/21/2014  . Anxiety state, unspecified   . Breast cancer ([place])    NO STICK OR BLOOD PRESSURE CHECKS IN LEFT ARM  . Carpal tunnel syndrome   . Cervical spondylosis   . Colon polyp 12/19/2009   Transverse-polypoid colorectal mucosa  . Constipation   . Degenerative joint disease   . Diastolic dysfunction   . Diverticula of colon   . Gastritis, chronic   . Hiatal hernia   . Hypertension   . Low back pain   . RBBB (right bundle branch block with left anterior fascicular block)   . Renal cyst   . Venous insufficiency     Patient Active Problem List   Diagnosis Date Noted  . Anxiety about health 05/28/2016  . Abnormal CT scan of lung 07/16/2015  . Abdominal pain 11/30/2014  . Chronic LLQ pain 11/23/2014  . Esophageal dysmotility 10/27/2014  . Cough 10/21/2014  . Other dysphagia 05/09/2014  . Loss of weight 04/26/2014  . Arthritis of hand, degenerative  04/10/2014  . Other and unspecified hyperlipidemia 04/10/2014  . LPRD (laryngopharyngeal reflux disease) 04/04/2014  . Routine general medical examination at a health care facility 02/21/2014  . Allergic rhinitis 02/21/2014  . Abnormal findings in stool 12/02/2013  . Abdominal pain, left lower quadrant 06/22/2013  . Chronic bronchitis ([place]) 05/30/2013  . Somatization disorder 03/14/2013  . Hx of breast cancer 08/30/2012  . Multiple somatic complaints 06/28/2012  . Rhinitis 05/11/2012  . Edema 06/12/2011  . GERD 03/27/2010  . DIZZINESS 08/31/2009  . Diastolic CHF ([place]) [id number]  . FLANK PAIN, LEFT 03/21/2009  . WEAKNESS 02/21/2009  . CONSTIPATION 01/18/2009  . FLATULENCE-GAS-BLOATING 01/18/2009  . CARPAL TUNNEL SYNDROME, BILATERAL 10/03/2008  . Cervical spondylosis without myelopathy 03/21/2008  . Diaphragmatic hernia 12/26/2007  . RENAL CYST 12/26/2007  . Anxiety state 09/15/2007  . Essential hypertension 09/15/2007  . VENOUS INSUFFICIENCY 09/15/2007  . DEGENERATIVE JOINT DISEASE 09/15/2007  . LOW BACK PAIN, CHRONIC 09/15/2007  . DIVERTICULOSIS, COLON 05/24/2002    Past Surgical History:  Procedure Laterality Date  . CARPAL TUNNEL RELEASE    . Left mastectomy      OB History    No data available       Home Medications    Prior to Admission medications   Medication Sig Start Date End Date Taking? Authorizing Provider  ALPRAZolam Duanne Moron) 0.5 MG tablet Take 0.25-0.5 mg by mouth 3 (three) times daily as needed for anxiety  or sleep.     Historical Provider, MD  carvedilol (COREG) 3.125 MG tablet Take 1 tablet (3.125 mg total) by mouth 2 (two) times daily. 02/02/14   Biagio Borg, MD  Cholecalciferol (VITAMIN D3) 1000 UNITS CAPS Take 1,000 Units by mouth daily. For your bones    Historical Provider, MD  citalopram (CELEXA) 10 MG tablet Take 1 tablet (10 mg total) by mouth daily. 08/03/14   Biagio Borg, MD  docusate sodium (COLACE) 100 MG capsule Take 1 capsule (100 mg  total) by mouth every 12 (twelve) hours. 05/03/16   Samantha Tripp Dowless, PA-C  fluticasone (FLONASE) 50 MCG/ACT nasal spray Place 2 sprays into both nostrils daily.    Historical Provider, MD  furosemide (LASIX) 40 MG tablet Take 40 mg by mouth daily.     Historical Provider, MD  guaiFENesin (MUCINEX) 600 MG 12 hr tablet Take 600 mg by mouth 2 (two) times daily.    Historical Provider, MD  Multiple Vitamins-Minerals (MULTIVITAMINS THER. W/MINERALS) TABS Take 1 tablet by mouth daily.    Historical Provider, MD  OVER THE COUNTER MEDICATION Take 8 oz by mouth 2 (two) times daily as needed (for constipation). Patient states she drink about 2 glasses of prune juice a day for constipation    Historical Provider, MD  pantoprazole (PROTONIX) 40 MG tablet Take 1 tablet (40 mg total) by mouth 2 (two) times daily. Patient not taking: Reported on 05/02/2016 09/21/14   Biagio Borg, MD  ranitidine (ZANTAC) 75 MG tablet Take 1 tablet (75 mg total) by mouth 2 (two) times daily. Patient taking differently: Take 75 mg by mouth 2 (two) times daily as needed for heartburn.  05/17/15   Serita Grit, MD    Family History Family History  Problem Relation Age of Onset  . Colon cancer Neg Hx     Social History Social History  Substance Use Topics  . Smoking status: Never Smoker  . Smokeless tobacco: Never Used  . Alcohol use No     Allergies   Fentanyl   Review of Systems Review of Systems  Constitutional: Negative for chills and fever.  HENT: Negative for facial swelling and sore throat.   Respiratory: Negative for shortness of breath.   Cardiovascular: Negative for chest pain.  Gastrointestinal: Positive for abdominal pain. Negative for nausea and vomiting.  Genitourinary: Positive for frequency. Negative for dysuria.  Musculoskeletal: Positive for arthralgias. Negative for back pain (chronic, suprapubic).  Skin: Negative for rash and wound.  Neurological: Negative for headaches.    Psychiatric/Behavioral: The patient is not nervous/anxious.      Physical Exam Updated Vital Signs BP 166/72   Pulse 66   Temp 98.3 F (36.8 C) (Oral)   Resp 16   Ht 5\' 2"  (1.575 m)   Wt 49.9 kg   SpO2 100%   BMI 20.12 kg/m   Physical Exam  Constitutional: She appears well-developed and well-nourished. No distress.  HENT:  Head: Normocephalic and atraumatic.  Mouth/Throat: Oropharynx is clear and moist. No oropharyngeal exudate.  Eyes: Conjunctivae and EOM are normal. Pupils are equal, round, and reactive to light. Right eye exhibits no discharge. Left eye exhibits no discharge. No scleral icterus.  Neck: Normal range of motion. Neck supple. No thyromegaly present.  Cardiovascular: Normal rate, regular rhythm, normal heart sounds and intact distal pulses.  Exam reveals no gallop and no friction rub.   No murmur heard. Pulmonary/Chest: Effort normal and breath sounds normal. No stridor. No respiratory distress. She  has no wheezes. She has no rales.  Abdominal: Soft. Bowel sounds are normal. She exhibits no distension. There is tenderness (Mild suprapubic). There is no rebound and no guarding.  Musculoskeletal: She exhibits no edema.       Left shoulder: She exhibits decreased range of motion, tenderness, bony tenderness and decreased strength. She exhibits normal pulse.       Arms: L arm: Decreased range of motion due to pain; radial pulse intact; normal sensation; tenderness over her left clavicle and palpation about the left shoulder joint anteriorly and posteriorly; NO tenderness to humerus, elbow, hand  Lymphadenopathy:    She has no cervical adenopathy.  Neurological: She is alert. Coordination normal.  CN 3-12 intact; normal sensation throughout; 5/5 strength in all 4 extremities; equal bilateral grip strength; no ataxia on finger to nose   Skin: Skin is warm and dry. No rash noted. She is not diaphoretic. No pallor.  Psychiatric: She has a normal mood and affect.   Nursing note and vitals reviewed.    ED Treatments / Results  Labs (all labs ordered are listed, but only abnormal results are displayed) Labs Reviewed - No data to display  EKG  EKG Interpretation None       Radiology Dg Chest 2 View  Result Date: 07/28/2016 CLINICAL DATA:  [place] in kitchen 1 hour ago. EXAM: CHEST  2 VIEW COMPARISON:  Chest radiograph September 21, 2015 FINDINGS: Cardiac silhouette is mildly enlarged unchanged. Mildly tortuous calcified aorta. Increased lung volumes without pleural effusion or focal consolidation. Strandy densities LEFT lung base. No pneumothorax. Old RIGHT posterior rib fractures. Acute distal LEFT clavicle fracture. Soft tissue planes are nonsuspicious. IMPRESSION: Mild cardiomegaly, LEFT lung base atelectasis. Acute distal LEFT clavicle fracture better characterized on today's dedicated radiographs. Old RIGHT posterior rib fractures. Electronically Signed   By: Elon Alas M.D.   On: 07/28/2016 23:40   Dg Scapula Left  Result Date: 07/29/2016 CLINICAL DATA:  Fall with left shoulder pain. EXAM: LEFT SCAPULA - 2+ VIEWS COMPARISON:  Shoulder radiographs 2 hours prior. FINDINGS: Mildly displaced distal left clavicle fracture. Scapula appears intact. Acromioclavicular joint better assessed on shoulder radiograph. IMPRESSION: Mildly displaced distal clavicle fracture. No evidence of concurrent scapular fracture. Electronically Signed   By: Jeb Levering M.D.   On: 07/29/2016 01:28   Ct Head Wo Contrast  Result Date: 07/29/2016 CLINICAL DATA:  Fall today.  No loss of consciousness. EXAM: CT HEAD WITHOUT CONTRAST TECHNIQUE: Contiguous axial images were obtained from the base of the skull through the vertex without intravenous contrast. COMPARISON:  None. FINDINGS: Brain: No evidence of acute infarction, hemorrhage, hydrocephalus, extra-axial collection or mass lesion/mass effect. Atrophy and chronic small vessel ischemia, likely normal for age.  Vascular: No hyperdense vessel or unexpected calcification. Atherosclerosis of skullbase vasculature. Skull: Normal. Negative for fracture or focal lesion. Sinuses/Orbits: No acute finding.  Bilateral cataract resection. IMPRESSION: No acute intracranial abnormality. Electronically Signed   By: Jeb Levering M.D.   On: 07/29/2016 02:54   Dg Shoulder Left  Result Date: 07/28/2016 CLINICAL DATA:  Shoulder pain after falling in kitchen 1 hour ago. EXAM: LEFT SHOULDER - 2+ VIEW COMPARISON:  Chest radiograph September 21, 2015 FINDINGS: Acute vertically oriented fracture through the distal LEFT clavicle without intra-articular extension ; slight inferior angulation of distal bony fragments. Slight cortical irregularity of the scapular tip. Possible coracoid fracture. Humeral head is well formed and located. No dislocation; joint space intact. No destructive bony lesions. Osteopenia. Soft tissue planes are  nonsuspicious. IMPRESSION: Acute mildly displaced distal LEFT clavicle fracture. No dislocation. Possible nondisplaced coracoid fracture. Suspected scapular tip fracture, recommend dedicated scapular views. Electronically Signed   By: Elon Alas M.D.   On: 07/28/2016 23:34    Procedures Procedures (including critical care time)  Medications Ordered in ED Medications  acetaminophen (TYLENOL) tablet 650 mg (650 mg Oral Given 07/29/16 0153)     Initial Impression / Assessment and Plan / ED Course  I have reviewed the triage vital signs and the nursing notes.  Pertinent labs & imaging results that were available during my care of the patient were reviewed by me and considered in my medical decision making (see chart for details).  Clinical Course    Patient with mildly displaced distal left clavicle fracture; possible nondisplaced coracoid fracture. CT head negative. Patient given Tylenol in the ED for pain control. Patient fitted with arm sling. Patient advised to wear at all times and to  follow-up with orthopedics in 2 weeks. Follow-up with PCP as needed and continue evaluation of chronic abdominal pain. Patient understands and agrees with the plan. Patient vital stable throughout the course in discharged in satisfactory condition. Patient also evaluated by Dr. Kathrynn Humble who guided the patient's management and agrees with plan.  Final Clinical Impressions(s) / ED Diagnoses   Final diagnoses:  Clavicle fracture, shaft, left, closed, initial encounter  Closed coracoid process fracture, left, initial encounter  Fall, initial encounter    New Prescriptions Discharge Medication List as of 07/29/2016  3:12 AM       Frederica Kuster, PA-C 07/29/16 [place], MD 07/30/16 [phone]

## 2016-07-29 NOTE — Discharge Instructions (Signed)
Treatment: Keep your sling on your arm at all times until you are seen by the orthopedic doctor. You can take Tylenol as prescribed over-the-counter as needed for your pain.  Follow-up: Please follow-up with Dr. Percell Miller, an orthopedic doctor, in 2 weeks for follow-up and further evaluation and treatment of your fracture. Please follow-up with your primary care provider as needed. Please return to the emergency department if you develop any new or worsening symptoms.

## 2016-07-30 DIAGNOSIS — N183 Chronic kidney disease, stage 3 (moderate): Secondary | ICD-10-CM | POA: Diagnosis not present

## 2016-07-30 DIAGNOSIS — S42002D Fracture of unspecified part of left clavicle, subsequent encounter for fracture with routine healing: Secondary | ICD-10-CM | POA: Diagnosis not present

## 2016-07-30 DIAGNOSIS — I130 Hypertensive heart and chronic kidney disease with heart failure and stage 1 through stage 4 chronic kidney disease, or unspecified chronic kidney disease: Secondary | ICD-10-CM | POA: Diagnosis not present

## 2016-07-30 DIAGNOSIS — I872 Venous insufficiency (chronic) (peripheral): Secondary | ICD-10-CM | POA: Diagnosis not present

## 2016-07-30 DIAGNOSIS — I5032 Chronic diastolic (congestive) heart failure: Secondary | ICD-10-CM | POA: Diagnosis not present

## 2016-08-04 DIAGNOSIS — M25512 Pain in left shoulder: Secondary | ICD-10-CM | POA: Diagnosis not present

## 2016-08-25 ENCOUNTER — Ambulatory Visit: Payer: Medicare Other | Admitting: Nurse Practitioner

## 2016-08-29 ENCOUNTER — Ambulatory Visit (INDEPENDENT_AMBULATORY_CARE_PROVIDER_SITE_OTHER): Payer: Medicare Other | Admitting: Nurse Practitioner

## 2016-08-29 ENCOUNTER — Encounter: Payer: Self-pay | Admitting: Nurse Practitioner

## 2016-08-29 VITALS — BP 136/72 | Temp 97.9°F | Ht 62.0 in | Wt 116.0 lb

## 2016-08-29 DIAGNOSIS — M19041 Primary osteoarthritis, right hand: Secondary | ICD-10-CM | POA: Diagnosis not present

## 2016-08-29 DIAGNOSIS — M545 Low back pain, unspecified: Secondary | ICD-10-CM

## 2016-08-29 DIAGNOSIS — G8929 Other chronic pain: Secondary | ICD-10-CM

## 2016-08-29 DIAGNOSIS — I5032 Chronic diastolic (congestive) heart failure: Secondary | ICD-10-CM | POA: Diagnosis not present

## 2016-08-29 DIAGNOSIS — R627 Adult failure to thrive: Secondary | ICD-10-CM

## 2016-08-29 DIAGNOSIS — M47812 Spondylosis without myelopathy or radiculopathy, cervical region: Secondary | ICD-10-CM

## 2016-08-29 DIAGNOSIS — G5603 Carpal tunnel syndrome, bilateral upper limbs: Secondary | ICD-10-CM | POA: Diagnosis not present

## 2016-08-29 DIAGNOSIS — M19042 Primary osteoarthritis, left hand: Secondary | ICD-10-CM

## 2016-08-29 NOTE — Progress Notes (Signed)
Pre visit review using our clinic review tool, if applicable. No additional management support is needed unless otherwise documented below in the visit note. 

## 2016-08-29 NOTE — Progress Notes (Signed)
Subjective:  Patient ID: Heather [name], female    DOB: 1918/10/01  Age: 82 y.o. MRN: [id number]  CC: Weight Loss (Pt stated need PCP and losing weight)   HPI Patient is concerned about gradual weight loss despite good appetite. She lives alone with some assistance from her son. She is accompanied by her neighbor today who is the waiting room. Reports difficulty with ADLs due to pain and unsteady gait. States back and joint pain is managed by tramadol and tylenol. She will like to have assistance at home.   Outpatient Medications Prior to Visit  Medication Sig Dispense Refill  . ALPRAZolam (XANAX) 0.5 MG tablet Take 0.25-0.5 mg by mouth 3 (three) times daily as needed for anxiety or sleep.     . carvedilol (COREG) 3.125 MG tablet Take 1 tablet (3.125 mg total) by mouth 2 (two) times daily. 180 tablet 3  . Cholecalciferol (VITAMIN D3) 1000 UNITS CAPS Take 1,000 Units by mouth daily. For your bones    . docusate sodium (COLACE) 100 MG capsule Take 1 capsule (100 mg total) by mouth every 12 (twelve) hours. 60 capsule 0  . furosemide (LASIX) 40 MG tablet Take 40 mg by mouth daily.     . Multiple Vitamins-Minerals (MULTIVITAMINS THER. W/MINERALS) TABS Take 1 tablet by mouth daily.    Marland Kitchen OVER THE COUNTER MEDICATION Take 8 oz by mouth 2 (two) times daily as needed (for constipation). Patient states she drink about 2 glasses of prune juice a day for constipation    . pantoprazole (PROTONIX) 40 MG tablet Take 1 tablet (40 mg total) by mouth 2 (two) times daily. 60 tablet 11  . citalopram (CELEXA) 10 MG tablet Take 1 tablet (10 mg total) by mouth daily. 90 tablet 3  . fluticasone (FLONASE) 50 MCG/ACT nasal spray Place 2 sprays into both nostrils daily.    Marland Kitchen guaiFENesin (MUCINEX) 600 MG 12 hr tablet Take 600 mg by mouth 2 (two) times daily.    . ranitidine (ZANTAC) 75 MG tablet Take 1 tablet (75 mg total) by mouth 2 (two) times daily. (Patient taking differently: Take 75 mg by mouth 2 (two) times  daily as needed for heartburn. ) 28 tablet 0   No facility-administered medications prior to visit.     ROS See HPI  Objective:  BP 136/72 (BP Location: Right Arm, Patient Position: Sitting, Cuff Size: Normal)   Temp 97.9 F (36.6 C)   Ht 5\' 2"  (1.575 m)   Wt 116 lb (52.6 kg)   SpO2 97%   BMI 21.22 kg/m   BP Readings from Last 3 Encounters:  08/29/16 136/72  07/29/16 166/72  05/28/16 (!) 112/58    Wt Readings from Last 3 Encounters:  08/29/16 116 lb (52.6 kg)  07/28/16 110 lb (49.9 kg)  05/28/16 120 lb 2 oz (54.5 kg)    Physical Exam  Constitutional: She is oriented to person, place, and time. No distress.  HENT:  Mouth/Throat: No oropharyngeal exudate.  Eyes: No scleral icterus.  Neck: Normal range of motion. Neck supple.  Cardiovascular: Normal rate and normal heart sounds.   Pulmonary/Chest: Effort normal and breath sounds normal.  Abdominal: Soft. Bowel sounds are normal. She exhibits no distension. There is no tenderness. There is no rebound and no guarding.  Genitourinary:  Genitourinary Comments: Incontinent of urine (use of depends)  Musculoskeletal: She exhibits edema.  Bilateral LE edema (chronic per patient). Arthritic hand and knee joints without any erythema or effusion. Limited joint ROM. Upper  back kyphosis.  Neurological: She is alert and oriented to person, place, and time.  In wheelchair, but able to stand with 1assist.  she appears frail, but clean and dressed appropriately for season.  Skin: Skin is warm and dry.  Psychiatric: She has a normal mood and affect. Her behavior is normal.    Lab Results  Component Value Date   WBC 8.5 05/02/2016   HGB 12.5 05/02/2016   HCT 38.4 05/02/2016   PLT 205 05/02/2016   GLUCOSE 104 (H) 05/02/2016   CHOL 199 02/02/2014   TRIG 20.0 02/02/2014   HDL 98.70 02/02/2014   LDLCALC 96 02/02/2014   ALT 19 05/02/2016   AST 24 05/02/2016   NA 139 05/02/2016   K 3.7 05/02/2016   CL 102 05/02/2016    CREATININE 0.93 05/02/2016   BUN 21 (H) 05/02/2016   CO2 29 05/02/2016   TSH 2.67 11/30/2014    Dg Scapula Left  Result Date: 07/29/2016 CLINICAL DATA:  Fall with left shoulder pain. EXAM: LEFT SCAPULA - 2+ VIEWS COMPARISON:  Shoulder radiographs 2 hours prior. FINDINGS: Mildly displaced distal left clavicle fracture. Scapula appears intact. Acromioclavicular joint better assessed on shoulder radiograph. IMPRESSION: Mildly displaced distal clavicle fracture. No evidence of concurrent scapular fracture. Electronically Signed   By: Jeb Levering M.D.   On: 07/29/2016 01:28   Ct Head Wo Contrast  Result Date: 07/29/2016 CLINICAL DATA:  Fall today.  No loss of consciousness. EXAM: CT HEAD WITHOUT CONTRAST TECHNIQUE: Contiguous axial images were obtained from the base of the skull through the vertex without intravenous contrast. COMPARISON:  None. FINDINGS: Brain: No evidence of acute infarction, hemorrhage, hydrocephalus, extra-axial collection or mass lesion/mass effect. Atrophy and chronic small vessel ischemia, likely normal for age. Vascular: No hyperdense vessel or unexpected calcification. Atherosclerosis of skullbase vasculature. Skull: Normal. Negative for fracture or focal lesion. Sinuses/Orbits: No acute finding.  Bilateral cataract resection. IMPRESSION: No acute intracranial abnormality. Electronically Signed   By: Jeb Levering M.D.   On: 07/29/2016 02:54    Assessment & Plan:   Kelsey was seen today for weight loss.  Diagnoses and all orders for this visit:  Chronic diastolic congestive heart failure ([place]) -     Ambulatory referral to [facility]  Failure to thrive in adult -     Ambulatory referral to [place]  Bilateral carpal tunnel syndrome -     Ambulatory referral to [place]  Primary osteoarthritis of both hands -     Ambulatory referral to [place]  Cervical spondylosis without myelopathy -     Ambulatory referral to [place]  Chronic  bilateral low back pain without sciatica -     Ambulatory referral to [place]   I have discontinued Ms. Skates's citalopram, ranitidine, guaiFENesin, fluticasone, and simethicone. I am also having her maintain her Vitamin D3, multivitamins ther. w/minerals, carvedilol, pantoprazole, ALPRAZolam, furosemide, OVER THE COUNTER MEDICATION, docusate sodium, and traMADol.  Meds ordered this encounter  Medications  . DISCONTD: simethicone (MYLICON) 80 MG chewable tablet    Sig: Chew 80 mg by mouth.  . traMADol (ULTRAM) 50 MG tablet    Sig: Take 50 mg by mouth.    Follow-up: Return in about 1 month (around 09/29/2016) for weight loss.  Wilfred Lacy, NP

## 2016-08-29 NOTE — Patient Instructions (Addendum)
Please bring all medications during next office visit. You will be called with appt for home health assistance. Encourage to continue use of ensure TID.  She was advised about home fall precautions.

## 2016-09-02 ENCOUNTER — Telehealth: Payer: Self-pay | Admitting: Nurse Practitioner

## 2016-09-02 NOTE — Telephone Encounter (Signed)
Left mess for patient to call back.  

## 2016-09-02 NOTE — Telephone Encounter (Signed)
Patient states she is urinating a lot and that she is having really bad stomach pain in the mornings.  She is requesting call in regard.  Patient also states she has lost a lot of weight.   I have scheduled patient for next available date, this Thursday.  Please follow up with patient in regard as well.

## 2016-09-02 NOTE — Telephone Encounter (Signed)
Weight loss was discussed during her last office visit. This is a chronic issue. We can discuss this further during her next office visit. Please direct patient to urgent care clinic if abdominal pain and urinary symptoms cannot wait till Thursday.

## 2016-09-04 ENCOUNTER — Encounter: Payer: Self-pay | Admitting: Nurse Practitioner

## 2016-09-04 ENCOUNTER — Ambulatory Visit (INDEPENDENT_AMBULATORY_CARE_PROVIDER_SITE_OTHER): Payer: Medicare Other | Admitting: Nurse Practitioner

## 2016-09-04 ENCOUNTER — Other Ambulatory Visit: Payer: Medicare Other

## 2016-09-04 ENCOUNTER — Ambulatory Visit: Payer: Medicare Other | Admitting: Nurse Practitioner

## 2016-09-04 VITALS — BP 122/86 | HR 88 | Temp 99.1°F | Ht 62.0 in | Wt 118.0 lb

## 2016-09-04 DIAGNOSIS — K2100 Gastro-esophageal reflux disease with esophagitis, without bleeding: Secondary | ICD-10-CM

## 2016-09-04 DIAGNOSIS — R350 Frequency of micturition: Secondary | ICD-10-CM

## 2016-09-04 DIAGNOSIS — R1084 Generalized abdominal pain: Secondary | ICD-10-CM

## 2016-09-04 LAB — POCT URINALYSIS DIPSTICK
BILIRUBIN UA: NEGATIVE
GLUCOSE UA: NEGATIVE
KETONES UA: NEGATIVE
Nitrite, UA: NEGATIVE
PH UA: 6
Protein, UA: NEGATIVE
Spec Grav, UA: 1.025
Urobilinogen, UA: 0.2

## 2016-09-04 MED ORDER — PANTOPRAZOLE SODIUM 40 MG PO TBEC
40.0000 mg | DELAYED_RELEASE_TABLET | Freq: Two times a day (BID) | ORAL | 11 refills | Status: DC
Start: 2016-09-04 — End: 2017-01-19

## 2016-09-04 NOTE — Progress Notes (Signed)
Pre visit review using our clinic review tool, if applicable. No additional management support is needed unless otherwise documented below in the visit note. 

## 2016-09-04 NOTE — Progress Notes (Signed)
Subjective:  Patient ID: Heather [name], female    DOB: 04-01-1918  Age: 82 y.o. MRN: [id number]  CC: Abdominal Pain (frequent urination, stomach pain in the morning in her stomach and lose of weight)   Urinary Frequency   This is a chronic problem. The problem occurs every urination. The problem has been gradually worsening. The pain is at a severity of 0/10. The patient is experiencing no pain. There has been no fever. She is not sexually active. There is no history of pyelonephritis. Associated symptoms include frequency and urgency. Pertinent negatives include no chills, discharge, flank pain, hematuria, hesitancy, sweats or vomiting. She has tried nothing for the symptoms. There is no history of catheterization, kidney stones, recurrent UTIs, a single kidney or urinary stasis.   she has not been using pantoprazole for GERD symptoms as prescribed by   Outpatient Medications Prior to Visit  Medication Sig Dispense Refill  . ALPRAZolam (XANAX) 0.5 MG tablet Take 0.25-0.5 mg by mouth 3 (three) times daily as needed for anxiety or sleep.     . carvedilol (COREG) 3.125 MG tablet Take 1 tablet (3.125 mg total) by mouth 2 (two) times daily. 180 tablet 3  . Cholecalciferol (VITAMIN D3) 1000 UNITS CAPS Take 1,000 Units by mouth daily. For your bones    . docusate sodium (COLACE) 100 MG capsule Take 1 capsule (100 mg total) by mouth every 12 (twelve) hours. 60 capsule 0  . furosemide (LASIX) 40 MG tablet Take 40 mg by mouth daily.     . Multiple Vitamins-Minerals (MULTIVITAMINS THER. W/MINERALS) TABS Take 1 tablet by mouth daily.    Marland Kitchen OVER THE COUNTER MEDICATION Take 8 oz by mouth 2 (two) times daily as needed (for constipation). Patient states she drink about 2 glasses of prune juice a day for constipation    . traMADol (ULTRAM) 50 MG tablet Take 50 mg by mouth.    . pantoprazole (PROTONIX) 40 MG tablet Take 1 tablet (40 mg total) by mouth 2 (two) times daily. 60 tablet 11   No  facility-administered medications prior to visit.     ROS See HPI  Objective:  BP 122/86 (BP Location: Left Arm, Patient Position: Sitting, Cuff Size: Normal)   Pulse 88   Temp 99.1 F (37.3 C)   Ht 5\' 2"  (1.575 m)   Wt 118 lb (53.5 kg)   SpO2 98%   BMI 21.58 kg/m   BP Readings from Last 3 Encounters:  09/04/16 122/86  08/29/16 136/72  07/29/16 166/72    Wt Readings from Last 3 Encounters:  09/04/16 118 lb (53.5 kg)  08/29/16 116 lb (52.6 kg)  07/28/16 110 lb (49.9 kg)    Physical Exam  Constitutional: She is oriented to person, place, and time.  Neck: Normal range of motion. Neck supple.  Cardiovascular: Normal rate and normal heart sounds.   Pulmonary/Chest: Effort normal and breath sounds normal.  Abdominal: Soft. Bowel sounds are normal. She exhibits no distension. There is no tenderness.  Musculoskeletal: She exhibits edema.  Neurological: She is alert and oriented to person, place, and time.  Skin: Skin is warm and dry.    Lab Results  Component Value Date   WBC 8.5 05/02/2016   HGB 12.5 05/02/2016   HCT 38.4 05/02/2016   PLT 205 05/02/2016   GLUCOSE 104 (H) 05/02/2016   CHOL 199 02/02/2014   TRIG 20.0 02/02/2014   HDL 98.70 02/02/2014   LDLCALC 96 02/02/2014   ALT 19 05/02/2016  AST 24 05/02/2016   NA 139 05/02/2016   K 3.7 05/02/2016   CL 102 05/02/2016   CREATININE 0.93 05/02/2016   BUN 21 (H) 05/02/2016   CO2 29 05/02/2016   TSH 2.67 11/30/2014    Dg Scapula Left  Result Date: 07/29/2016 CLINICAL DATA:  Fall with left shoulder pain. EXAM: LEFT SCAPULA - 2+ VIEWS COMPARISON:  Shoulder radiographs 2 hours prior. FINDINGS: Mildly displaced distal left clavicle fracture. Scapula appears intact. Acromioclavicular joint better assessed on shoulder radiograph. IMPRESSION: Mildly displaced distal clavicle fracture. No evidence of concurrent scapular fracture. Electronically Signed   By: Jeb Levering M.D.   On: 07/29/2016 01:28   Ct Head Wo  Contrast  Result Date: 07/29/2016 CLINICAL DATA:  Fall today.  No loss of consciousness. EXAM: CT HEAD WITHOUT CONTRAST TECHNIQUE: Contiguous axial images were obtained from the base of the skull through the vertex without intravenous contrast. COMPARISON:  None. FINDINGS: Brain: No evidence of acute infarction, hemorrhage, hydrocephalus, extra-axial collection or mass lesion/mass effect. Atrophy and chronic small vessel ischemia, likely normal for age. Vascular: No hyperdense vessel or unexpected calcification. Atherosclerosis of skullbase vasculature. Skull: Normal. Negative for fracture or focal lesion. Sinuses/Orbits: No acute finding.  Bilateral cataract resection. IMPRESSION: No acute intracranial abnormality. Electronically Signed   By: Jeb Levering M.D.   On: 07/29/2016 02:54    Assessment & Plan:   Kennedey was seen today for abdominal pain.  Diagnoses and all orders for this visit:  Increased urinary frequency -     POCT urinalysis dipstick -     Urine culture; Future  Generalized abdominal pain -     pantoprazole (PROTONIX) 40 MG tablet; Take 1 tablet (40 mg total) by mouth 2 (two) times daily.  Gastroesophageal reflux disease with esophagitis -     pantoprazole (PROTONIX) 40 MG tablet; Take 1 tablet (40 mg total) by mouth 2 (two) times daily.   I am having Ms. Printz maintain her Vitamin D3, multivitamins ther. w/minerals, carvedilol, ALPRAZolam, furosemide, OVER THE COUNTER MEDICATION, docusate sodium, traMADol, and pantoprazole.  Meds ordered this encounter  Medications  . pantoprazole (PROTONIX) 40 MG tablet    Sig: Take 1 tablet (40 mg total) by mouth 2 (two) times daily.    Dispense:  60 tablet    Refill:  11    Order Specific Question:   Supervising Provider    Answer:   Cassandria Anger [[id number]]    Follow-up: Return if symptoms worsen or fail to improve.  Wilfred Lacy, NP

## 2016-09-04 NOTE — Patient Instructions (Signed)
Start pantoprazole as prescribed. We will call with urine culture results You will be called with home health information.

## 2016-09-04 NOTE — Telephone Encounter (Signed)
Pt is scheduled with Baldo Ash, NP today. Closing phone note.

## 2016-09-05 LAB — URINE CULTURE: ORGANISM ID, BACTERIA: NO GROWTH

## 2016-09-05 NOTE — Progress Notes (Signed)
Reviewed with patient in office. See office note

## 2016-09-08 DIAGNOSIS — M25512 Pain in left shoulder: Secondary | ICD-10-CM | POA: Diagnosis not present

## 2016-09-08 NOTE — Progress Notes (Signed)
Normal results

## 2016-09-11 ENCOUNTER — Encounter: Payer: Self-pay | Admitting: Nurse Practitioner

## 2016-09-11 ENCOUNTER — Ambulatory Visit (INDEPENDENT_AMBULATORY_CARE_PROVIDER_SITE_OTHER): Payer: Medicare Other | Admitting: Nurse Practitioner

## 2016-09-11 VITALS — BP 118/76 | HR 86 | Temp 97.4°F | Ht 62.0 in | Wt 118.0 lb

## 2016-09-11 DIAGNOSIS — M19041 Primary osteoarthritis, right hand: Secondary | ICD-10-CM

## 2016-09-11 DIAGNOSIS — M19042 Primary osteoarthritis, left hand: Secondary | ICD-10-CM

## 2016-09-11 DIAGNOSIS — R1084 Generalized abdominal pain: Secondary | ICD-10-CM

## 2016-09-11 NOTE — Progress Notes (Signed)
Pre visit review using our clinic review tool, if applicable. No additional management support is needed unless otherwise documented below in the visit note. 

## 2016-09-11 NOTE — Progress Notes (Signed)
Subjective:  Patient ID: [name], female    DOB: [birth year]  Age: 82 y.o. MRN: [id number]  CC: Abdominal Pain (Pt stated keep loosing weight and talk about mammogram letter.)   HPI Ms. Heckstall presents with chronic generalized pain, Worse in left upper quadrant of abdomen. Due to previous history of breast cancer status post left mastectomy, patient is concerned that she may have a cancerous lesion in left upper quadrant of abdomen. She is hesitant about using tramadol and ibuprofen due to constipation. She wants to know why she keeps having pain. Despite extensive evaluation by gastroenterology, patient thinks she may have a cancerous lesion that's causing pain and weight loss.  Outpatient Medications Prior to Visit  Medication Sig Dispense Refill  . ALPRAZolam (XANAX) 0.5 MG tablet Take 0.25-0.5 mg by mouth 3 (three) times daily as needed for anxiety or sleep.     . carvedilol (COREG) 3.125 MG tablet Take 1 tablet (3.125 mg total) by mouth 2 (two) times daily. 180 tablet 3  . Cholecalciferol (VITAMIN D3) 1000 UNITS CAPS Take 1,000 Units by mouth daily. For your bones    . docusate sodium (COLACE) 100 MG capsule Take 1 capsule (100 mg total) by mouth every 12 (twelve) hours. 60 capsule 0  . furosemide (LASIX) 40 MG tablet Take 40 mg by mouth daily.     . Multiple Vitamins-Minerals (MULTIVITAMINS THER. W/MINERALS) TABS Take 1 tablet by mouth daily.    Marland Kitchen OVER THE COUNTER MEDICATION Take 8 oz by mouth 2 (two) times daily as needed (for constipation). Patient states she drink about 2 glasses of prune juice a day for constipation    . pantoprazole (PROTONIX) 40 MG tablet Take 1 tablet (40 mg total) by mouth 2 (two) times daily. 60 tablet 11  . traMADol (ULTRAM) 50 MG tablet Take 50 mg by mouth.     No facility-administered medications prior to visit.     ROS See HPI  Objective:  BP 118/76 (BP Location: Right Arm, Patient Position: Sitting, Cuff Size: Normal)   Pulse 86   Temp  97.4 F (36.3 C)   Ht 5\' 2"  (1.575 m)   Wt 118 lb (53.5 kg)   SpO2 98%   BMI 21.58 kg/m   BP Readings from Last 3 Encounters:  09/11/16 118/76  09/04/16 122/86  08/29/16 136/72    Wt Readings from Last 3 Encounters:  09/11/16 118 lb (53.5 kg)  09/04/16 118 lb (53.5 kg)  08/29/16 116 lb (52.6 kg)    Physical Exam  Constitutional: She is oriented to person, place, and time. No distress.  Eyes: No scleral icterus.  Cardiovascular: Normal rate, regular rhythm and normal heart sounds.   Pulmonary/Chest: Effort normal and breath sounds normal.  Abdominal: Soft. Bowel sounds are normal. She exhibits no distension. There is no tenderness.  Musculoskeletal: She exhibits edema.  Neurological: She is alert and oriented to person, place, and time.  Skin: Skin is warm and dry. No rash noted. No erythema.  Vitals reviewed.   Lab Results  Component Value Date   WBC 8.5 05/02/2016   HGB 12.5 05/02/2016   HCT 38.4 05/02/2016   PLT 205 05/02/2016   GLUCOSE 104 (H) 05/02/2016   CHOL 199 02/02/2014   TRIG 20.0 02/02/2014   HDL 98.70 02/02/2014   LDLCALC 96 02/02/2014   ALT 19 05/02/2016   AST 24 05/02/2016   NA 139 05/02/2016   K 3.7 05/02/2016   CL 102 05/02/2016   CREATININE 0.93  05/02/2016   BUN 21 (H) 05/02/2016   CO2 29 05/02/2016   TSH 2.67 11/30/2014    Dg Scapula Left  Result Date: 07/29/2016 CLINICAL DATA:  Fall with left shoulder pain. EXAM: LEFT SCAPULA - 2+ VIEWS COMPARISON:  Shoulder radiographs 2 hours prior. FINDINGS: Mildly displaced distal left clavicle fracture. Scapula appears intact. Acromioclavicular joint better assessed on shoulder radiograph. IMPRESSION: Mildly displaced distal clavicle fracture. No evidence of concurrent scapular fracture. Electronically Signed   By: Jeb Levering M.D.   On: 07/29/2016 01:28   Ct Head Wo Contrast  Result Date: 07/29/2016 CLINICAL DATA:  Fall today.  No loss of consciousness. EXAM: CT HEAD WITHOUT CONTRAST TECHNIQUE:  Contiguous axial images were obtained from the base of the skull through the vertex without intravenous contrast. COMPARISON:  None. FINDINGS: Brain: No evidence of acute infarction, hemorrhage, hydrocephalus, extra-axial collection or mass lesion/mass effect. Atrophy and chronic small vessel ischemia, likely normal for age. Vascular: No hyperdense vessel or unexpected calcification. Atherosclerosis of skullbase vasculature. Skull: Normal. Negative for fracture or focal lesion. Sinuses/Orbits: No acute finding.  Bilateral cataract resection. IMPRESSION: No acute intracranial abnormality. Electronically Signed   By: Jeb Levering M.D.   On: 07/29/2016 02:54   Reviewed imaging results: CT abdomen pelvis with contrast (05/02/2016, no acute finding, no lesions), MRI of abdomen with and without contrast (12/11/2015, no acute finding, no malignant lesions).  Assessment & Plan:   Lucie was seen today for abdominal pain.  Diagnoses and all orders for this visit:  Primary osteoarthritis of both hands  Generalized abdominal pain   I am having Ms. Lambrecht maintain her Vitamin D3, multivitamins ther. w/minerals, carvedilol, ALPRAZolam, furosemide, OVER THE COUNTER MEDICATION, docusate sodium, traMADol, and pantoprazole.  No orders of the defined types were placed in this encounter.   Follow-up: Return if symptoms worsen or fail to improve.  Wilfred Lacy, NP

## 2016-09-11 NOTE — Patient Instructions (Addendum)
Take tramadol 1tab TID as needed Can also take tylenol 500mg  1tab TID as needed.  I tried to reassure patient that she is not losing weight at this time because weight training indicates a weight of 118 pounds in the last 1 month. I also reviewed GI workup by gastroenterologist and get findings. I encouraged breast patient to continue consuming small frequent meals and ensure.

## 2016-09-15 ENCOUNTER — Telehealth: Payer: Self-pay | Admitting: Nurse Practitioner

## 2016-09-15 NOTE — Telephone Encounter (Signed)
Routing to [place]----please advise, thanks 

## 2016-09-15 NOTE — Telephone Encounter (Signed)
Patient states she came in to see Baldo Ash this past Friday.  Patient states she is requesting call back.  States she spoke to [place] about her weight lose.  States she is eating all the time but she is still skin and bones.  States stomach is bones.  Patient states she can't walk.  States she has a knot on the left side of her body near her hips and stomach with left side body pain.  Patient states she spoke to [place] about all these things and is requesting a call back.  States she may go to the hospital.  Patient is requesting an extensive evaluation in all these areas.

## 2016-09-15 NOTE — Telephone Encounter (Signed)
I have spoken to patient multiple times about the same complaint. She has also been evaluated extensively by Gi for weight loss and left side pain. Her weight check on last 3 office visits have been stable. I do not know of any additional evaluation for her at this time.

## 2016-09-22 ENCOUNTER — Telehealth (INDEPENDENT_AMBULATORY_CARE_PROVIDER_SITE_OTHER): Payer: Self-pay | Admitting: Orthopaedic Surgery

## 2016-09-22 DIAGNOSIS — M545 Low back pain, unspecified: Secondary | ICD-10-CM

## 2016-09-22 DIAGNOSIS — G8929 Other chronic pain: Secondary | ICD-10-CM

## 2016-09-22 NOTE — Telephone Encounter (Signed)
Patient would like to know if Dr. Durward Fortes would like to see her for her back pain or if she should go see Dr. Ernestina Patches. Patient states she is having really bad back pain.

## 2016-09-23 NOTE — Telephone Encounter (Signed)
Dr Ernestina Patches please

## 2016-09-23 NOTE — Telephone Encounter (Signed)
Sent referral to Dr. Newton 

## 2016-09-23 NOTE — Telephone Encounter (Signed)
Please advise 

## 2016-09-25 DIAGNOSIS — K5900 Constipation, unspecified: Secondary | ICD-10-CM | POA: Diagnosis not present

## 2016-09-25 DIAGNOSIS — R1084 Generalized abdominal pain: Secondary | ICD-10-CM | POA: Diagnosis not present

## 2016-09-25 DIAGNOSIS — F419 Anxiety disorder, unspecified: Secondary | ICD-10-CM | POA: Diagnosis not present

## 2016-09-29 ENCOUNTER — Ambulatory Visit: Payer: Medicare Other | Admitting: Nurse Practitioner

## 2016-09-30 ENCOUNTER — Ambulatory Visit (INDEPENDENT_AMBULATORY_CARE_PROVIDER_SITE_OTHER): Payer: Medicare Other | Admitting: Nurse Practitioner

## 2016-09-30 ENCOUNTER — Encounter: Payer: Self-pay | Admitting: Nurse Practitioner

## 2016-09-30 VITALS — BP 132/88 | HR 82 | Temp 97.6°F | Ht 62.0 in | Wt 121.0 lb

## 2016-09-30 DIAGNOSIS — R1084 Generalized abdominal pain: Secondary | ICD-10-CM

## 2016-09-30 DIAGNOSIS — F411 Generalized anxiety disorder: Secondary | ICD-10-CM | POA: Diagnosis not present

## 2016-09-30 MED ORDER — ACETAMINOPHEN 500 MG PO TABS: 1000.0000 mg | ORAL_TABLET | Freq: Four times a day (QID) | ORAL | 1 refills | Status: DC | PRN

## 2016-09-30 MED ORDER — CITALOPRAM HYDROBROMIDE 10 MG PO TABS: 10.0000 mg | ORAL_TABLET | Freq: Every day | ORAL | 3 refills | Status: DC

## 2016-09-30 MED ORDER — ENSURE ENLIVE PO LIQD: 237.0000 mL | Freq: Two times a day (BID) | ORAL | 12 refills | Status: AC

## 2016-09-30 NOTE — Progress Notes (Signed)
Pre visit review using our clinic review tool, if applicable. No additional management support is needed unless otherwise documented below in the visit note. 

## 2016-09-30 NOTE — Progress Notes (Signed)
Subjective:  Patient ID: Heather [name], female    DOB: [birth year]  Age: 82 y.o. MRN: [id number]  CC: Abdominal Pain (Pt stated still having the knot/pain and loosing weight)  HPI Heather [name] presents today with persistent left sided abdominal pain and concerns about weight loss. She denies any nausea, vomiting, constipation or diarrhea, no shortness of breath, no chest pain, no rash, no fever, no dizziness. No change in appetite, no change in GU function, no change in sleep habit(states that she sleeps enough and feels rested). Last bowel movement was this morning (normal per patient, no melena, no hematochezia). She reports last weight checked at home to be 118 pounds without clothes.  She is not taking alprazolam nor tramadol at this time due to concerns about constipation.  She denies any fall since last office visit.  Outpatient Medications Prior to Visit  Medication Sig Dispense Refill  . ALPRAZolam (XANAX) 0.5 MG tablet Take 0.25-0.5 mg by mouth 3 (three) times daily as needed for anxiety or sleep.     . carvedilol (COREG) 3.125 MG tablet Take 1 tablet (3.125 mg total) by mouth 2 (two) times daily. 180 tablet 3  . Cholecalciferol (VITAMIN D3) 1000 UNITS CAPS Take 1,000 Units by mouth daily. For your bones    . docusate sodium (COLACE) 100 MG capsule Take 1 capsule (100 mg total) by mouth every 12 (twelve) hours. 60 capsule 0  . furosemide (LASIX) 40 MG tablet Take 40 mg by mouth daily.     . Multiple Vitamins-Minerals (MULTIVITAMINS THER. W/MINERALS) TABS Take 1 tablet by mouth daily.    Marland Kitchen OVER THE COUNTER MEDICATION Take 8 oz by mouth 2 (two) times daily as needed (for constipation). Patient states she drink about 2 glasses of prune juice a day for constipation    . pantoprazole (PROTONIX) 40 MG tablet Take 1 tablet (40 mg total) by mouth 2 (two) times daily. 60 tablet 11  . traMADol (ULTRAM) 50 MG tablet Take 50 mg by mouth.     No facility-administered medications prior to  visit.     ROS See HPI  Objective:  BP 132/88 (BP Location: Right Arm, Patient Position: Sitting, Cuff Size: Normal)   Pulse 82   Temp 97.6 F (36.4 C)   Ht 5\' 2"  (1.575 m)   Wt 121 lb (54.9 kg)   SpO2 97%   BMI 22.13 kg/m   BP Readings from Last 3 Encounters:  09/30/16 132/88  09/11/16 118/76  09/04/16 122/86    Wt Readings from Last 3 Encounters:  09/30/16 121 lb (54.9 kg)  09/11/16 118 lb (53.5 kg)  09/04/16 118 lb (53.5 kg)    Physical Exam  Constitutional: She is oriented to person, place, and time. No distress.  Cardiovascular: Normal rate.   Pulmonary/Chest: Effort normal.  Abdominal: Soft. Bowel sounds are normal.  Neurological: She is alert and oriented to person, place, and time.  Skin: Skin is warm and dry.  Vitals reviewed.   Lab Results  Component Value Date   WBC 8.5 05/02/2016   HGB 12.5 05/02/2016   HCT 38.4 05/02/2016   PLT 205 05/02/2016   GLUCOSE 104 (H) 05/02/2016   CHOL 199 02/02/2014   TRIG 20.0 02/02/2014   HDL 98.70 02/02/2014   LDLCALC 96 02/02/2014   ALT 19 05/02/2016   AST 24 05/02/2016   NA 139 05/02/2016   K 3.7 05/02/2016   CL 102 05/02/2016   CREATININE 0.93 05/02/2016   BUN 21 (H) 05/02/2016  CO2 29 05/02/2016   TSH 2.67 11/30/2014    Dg Scapula Left  Result Date: 07/29/2016 CLINICAL DATA:  Fall with left shoulder pain. EXAM: LEFT SCAPULA - 2+ VIEWS COMPARISON:  Shoulder radiographs 2 hours prior. FINDINGS: Mildly displaced distal left clavicle fracture. Scapula appears intact. Acromioclavicular joint better assessed on shoulder radiograph. IMPRESSION: Mildly displaced distal clavicle fracture. No evidence of concurrent scapular fracture. Electronically Signed   By: Jeb Levering M.D.   On: 07/29/2016 01:28   Ct Head Wo Contrast  Result Date: 07/29/2016 CLINICAL DATA:  Fall today.  No loss of consciousness. EXAM: CT HEAD WITHOUT CONTRAST TECHNIQUE: Contiguous axial images were obtained from the base of the skull  through the vertex without intravenous contrast. COMPARISON:  None. FINDINGS: Brain: No evidence of acute infarction, hemorrhage, hydrocephalus, extra-axial collection or mass lesion/mass effect. Atrophy and chronic small vessel ischemia, likely normal for age. Vascular: No hyperdense vessel or unexpected calcification. Atherosclerosis of skullbase vasculature. Skull: Normal. Negative for fracture or focal lesion. Sinuses/Orbits: No acute finding.  Bilateral cataract resection. IMPRESSION: No acute intracranial abnormality. Electronically Signed   By: Jeb Levering M.D.   On: 07/29/2016 02:54    Assessment & Plan:   [place] was seen today for abdominal pain.  Diagnoses and all orders for this visit:  Generalized abdominal pain -     feeding supplement, ENSURE ENLIVE, (ENSURE ENLIVE) LIQD; Take 237 mLs by mouth 2 (two) times daily between meals. -     acetaminophen (TYLENOL) 500 MG tablet; Take 2 tablets (1,000 mg total) by mouth every 6 (six) hours as needed.  Anxiety state -     citalopram (CELEXA) 10 MG tablet; Take 1 tablet (10 mg total) by mouth daily.  Mini-Mental score of 22/30.  I am having Ms. Thorns start on feeding supplement (ENSURE ENLIVE), acetaminophen, and citalopram. I am also having her maintain her Vitamin D3, multivitamins ther. w/minerals, carvedilol, ALPRAZolam, furosemide, OVER THE COUNTER MEDICATION, docusate sodium, traMADol, and pantoprazole.  Meds ordered this encounter  Medications  . feeding supplement, ENSURE ENLIVE, (ENSURE ENLIVE) LIQD    Sig: Take 237 mLs by mouth 2 (two) times daily between meals.    Dispense:  237 mL    Refill:  12    Order Specific Question:   Supervising Provider    Answer:   Cassandria Anger [[id number]]  . acetaminophen (TYLENOL) 500 MG tablet    Sig: Take 2 tablets (1,000 mg total) by mouth every 6 (six) hours as needed.    Dispense:  30 tablet    Refill:  1    Order Specific Question:   Supervising Provider    Answer:    Cassandria Anger [[id number]]  . citalopram (CELEXA) 10 MG tablet    Sig: Take 1 tablet (10 mg total) by mouth daily.    Dispense:  30 tablet    Refill:  3    Order Specific Question:   Supervising Provider    Answer:   Cassandria Anger [[id number]]    Follow-up: Return in about 3 months (around 12/31/2016), or if symptoms worsen or fail to improve, for HTN and ABD pain.  Wilfred Lacy, NP

## 2016-09-30 NOTE — Patient Instructions (Addendum)
Reassured patient again about weight stability. Also reviewed previous abdominal and chest radiology reports, as well as lab results.  Encourage patient to continue with current diet and use of ensure twice a day.  I informed patient that I am unable to order home health assistant at this time due to lack of insurance coverage. I suggested she and her son try to find a private nurse assistant to help her with ADLs at home.

## 2016-10-01 ENCOUNTER — Ambulatory Visit (INDEPENDENT_AMBULATORY_CARE_PROVIDER_SITE_OTHER): Payer: Self-pay | Admitting: Physical Medicine and Rehabilitation

## 2016-10-03 ENCOUNTER — Emergency Department (HOSPITAL_COMMUNITY)
Admission: EM | Admit: 2016-10-03 | Discharge: 2016-10-03 | Disposition: A | Discharge status: Home / Self Care | Payer: Medicare Other | Attending: Emergency Medicine | Admitting: Emergency Medicine

## 2016-10-03 ENCOUNTER — Encounter (HOSPITAL_COMMUNITY): Payer: Self-pay | Admitting: Emergency Medicine

## 2016-10-03 DIAGNOSIS — I110 Hypertensive heart disease with heart failure: Secondary | ICD-10-CM | POA: Diagnosis not present

## 2016-10-03 DIAGNOSIS — Z853 Personal history of malignant neoplasm of breast: Secondary | ICD-10-CM | POA: Diagnosis not present

## 2016-10-03 DIAGNOSIS — K5732 Diverticulitis of large intestine without perforation or abscess without bleeding: Secondary | ICD-10-CM

## 2016-10-03 DIAGNOSIS — I5030 Unspecified diastolic (congestive) heart failure: Secondary | ICD-10-CM | POA: Insufficient documentation

## 2016-10-03 DIAGNOSIS — Z79899 Other long term (current) drug therapy: Secondary | ICD-10-CM | POA: Diagnosis not present

## 2016-10-03 DIAGNOSIS — R1032 Left lower quadrant pain: Secondary | ICD-10-CM | POA: Diagnosis present

## 2016-10-03 LAB — URINALYSIS, ROUTINE W REFLEX MICROSCOPIC
Bilirubin Urine: NEGATIVE
GLUCOSE, UA: NEGATIVE mg/dL
KETONES UR: NEGATIVE mg/dL
Nitrite: NEGATIVE
PH: 6 (ref 5.0–8.0)
Protein, ur: NEGATIVE mg/dL
Specific Gravity, Urine: 1.016 (ref 1.005–1.030)

## 2016-10-03 LAB — COMPREHENSIVE METABOLIC PANEL
ALBUMIN: 4.2 g/dL (ref 3.5–5.0)
ALK PHOS: 38 U/L (ref 38–126)
ALT: 21 U/L (ref 14–54)
ANION GAP: 8 (ref 5–15)
AST: 32 U/L (ref 15–41)
BILIRUBIN TOTAL: 0.8 mg/dL (ref 0.3–1.2)
BUN: 25 mg/dL — ABNORMAL HIGH (ref 6–20)
CALCIUM: 9.7 mg/dL (ref 8.9–10.3)
CO2: 29 mmol/L (ref 22–32)
CREATININE: 1.09 mg/dL — AB (ref 0.44–1.00)
Chloride: 106 mmol/L (ref 101–111)
GFR calc Af Amer: 48 mL/min — ABNORMAL LOW (ref >60)
GFR calc non Af Amer: 41 mL/min — ABNORMAL LOW (ref >60)
GLUCOSE: 100 mg/dL — AB (ref 65–99)
Potassium: 3.5 mmol/L (ref 3.5–5.1)
SODIUM: 143 mmol/L (ref 135–145)
TOTAL PROTEIN: 7.1 g/dL (ref 6.5–8.1)

## 2016-10-03 LAB — CBC WITH DIFFERENTIAL/PLATELET
Basophils Absolute: 0 10*3/uL (ref 0.0–0.1)
Basophils Relative: 0 %
EOS PCT: 1 %
Eosinophils Absolute: 0.1 10*3/uL (ref 0.0–0.7)
HEMATOCRIT: 40 % (ref 36.0–46.0)
Hemoglobin: 13.2 g/dL (ref 12.0–15.0)
LYMPHS PCT: 38 %
Lymphs Abs: 2.4 10*3/uL (ref 0.7–4.0)
MCH: 31.1 pg (ref 26.0–34.0)
MCHC: 33 g/dL (ref 30.0–36.0)
MCV: 94.1 fL (ref 78.0–100.0)
MONO ABS: 0.8 10*3/uL (ref 0.1–1.0)
MONOS PCT: 12 %
NEUTROS ABS: 3.1 10*3/uL (ref 1.7–7.7)
Neutrophils Relative %: 49 %
Platelets: 210 10*3/uL (ref 150–400)
RBC: 4.25 MIL/uL (ref 3.87–5.11)
RDW: 14.2 % (ref 11.5–15.5)
WBC: 6.3 10*3/uL (ref 4.0–10.5)

## 2016-10-03 LAB — URINE MICROSCOPIC-ADD ON

## 2016-10-03 MED ORDER — METRONIDAZOLE 500 MG PO TABS: 500.0000 mg | ORAL_TABLET | Freq: Two times a day (BID) | ORAL | 0 refills | Status: DC

## 2016-10-03 MED ORDER — MORPHINE SULFATE (PF) 4 MG/ML IV SOLN
4.0000 mg | Freq: Once | INTRAVENOUS | Status: AC
  Administered 2016-10-03: 4 mg via INTRAVENOUS
  Filled 2016-10-03: qty 1

## 2016-10-03 MED ORDER — CIPROFLOXACIN HCL 500 MG PO TABS: 500.0000 mg | ORAL_TABLET | Freq: Two times a day (BID) | ORAL | 0 refills | Status: DC

## 2016-10-03 NOTE — Discharge Instructions (Signed)
Take the antibiotics for suspected diverticulitis. See your doctor in 1 week.

## 2016-10-03 NOTE — ED Provider Notes (Signed)
[facility] DEPT Provider Note   CSN: [id number] Arrival date & time: 10/03/16  0028  By signing my name below, I, Soijett Blue, attest that this documentation has been prepared under the direction and in the presence of Varney Biles, MD. Electronically Signed: Soijett Blue, ED Scribe. 10/03/16. 3:07 AM.   History   Chief Complaint Chief Complaint  Patient presents with  . Flank Pain    HPI Heather [name] is a 82 y.o. female with a PMHx of HTN, breast CA, who presents to the Emergency Department complaining of left lower abdominal pain onset 6-7 months. Pt states that she has informed her doctor of her symptoms and her provider contributed her symptoms to age. Pt notes that her left lower abdominal pain is worsened with ambulating and alleviated with rest. Pt reports that she came into the ED tonight due to an increase in her left lower abdominal pain. Pain is now constant, even when she is laying down. Pt states that her last colonoscopy was 6 years ago and it was normal. She states that she is having associated symptoms of weight loss and urinary frequency x 3-4 months. She states that she has not tried any medications for the relief for her symptoms. She denies nausea, vomiting, fever, chills, dysuria, hematuria, and any other symptoms.   The history is provided by the patient. No language interpreter was used.    Past Medical History:  Diagnosis Date  . Allergic rhinitis, cause unspecified 02/21/2014  . Anxiety state, unspecified   . Breast cancer ([place])    NO STICK OR BLOOD PRESSURE CHECKS IN LEFT ARM  . Carpal tunnel syndrome   . Cervical spondylosis   . Colon polyp 12/19/2009   Transverse-polypoid colorectal mucosa  . Constipation   . Degenerative joint disease   . Diastolic dysfunction   . Diverticula of colon   . Gastritis, chronic   . Hiatal hernia   . Hypertension   . Low back pain   . RBBB (right bundle branch block with left anterior fascicular block)   .  Renal cyst   . Venous insufficiency     Patient Active Problem List   Diagnosis Date Noted  . Anxiety about health 05/28/2016  . Abnormal CT scan of lung 07/16/2015  . Abdominal pain 11/30/2014  . Chronic LLQ pain 11/23/2014  . Esophageal dysmotility 10/27/2014  . Cough 10/21/2014  . Other dysphagia 05/09/2014  . Failure to thrive in adult 04/26/2014  . Arthritis of hand, degenerative 04/10/2014  . Other and unspecified hyperlipidemia 04/10/2014  . LPRD (laryngopharyngeal reflux disease) 04/04/2014  . Routine general medical examination at a health care facility 02/21/2014  . Allergic rhinitis 02/21/2014  . Abnormal findings in stool 12/02/2013  . Abdominal pain, left lower quadrant 06/22/2013  . Chronic bronchitis ([place]) 05/30/2013  . Somatization disorder 03/14/2013  . Hx of breast cancer 08/30/2012  . Multiple somatic complaints 06/28/2012  . Rhinitis 05/11/2012  . Edema 06/12/2011  . GERD 03/27/2010  . DIZZINESS 08/31/2009  . Diastolic CHF ([place]) [id number]  . FLANK PAIN, LEFT 03/21/2009  . WEAKNESS 02/21/2009  . CONSTIPATION 01/18/2009  . FLATULENCE-GAS-BLOATING 01/18/2009  . CARPAL TUNNEL SYNDROME, BILATERAL 10/03/2008  . Cervical spondylosis without myelopathy 03/21/2008  . Diaphragmatic hernia 12/26/2007  . RENAL CYST 12/26/2007  . Anxiety state 09/15/2007  . Essential hypertension 09/15/2007  . VENOUS INSUFFICIENCY 09/15/2007  . DEGENERATIVE JOINT DISEASE 09/15/2007  . LOW BACK PAIN, CHRONIC 09/15/2007  . DIVERTICULOSIS, COLON 05/24/2002    Past  Surgical History:  Procedure Laterality Date  . CARPAL TUNNEL RELEASE    . Left mastectomy      OB History    No data available       Home Medications    Prior to Admission medications   Medication Sig Start Date End Date Taking? Authorizing Provider  acetaminophen (TYLENOL) 500 MG tablet Take 2 tablets (1,000 mg total) by mouth every 6 (six) hours as needed. Patient taking differently: Take 1,000 mg by  mouth every 6 (six) hours as needed for mild pain.  09/30/16  Yes Charlene Brooke Nche, NP  ALPRAZolam Duanne Moron) 0.5 MG tablet Take 0.25-0.5 mg by mouth 3 (three) times daily as needed for anxiety or sleep.    Yes Historical Provider, MD  carvedilol (COREG) 3.125 MG tablet Take 1 tablet (3.125 mg total) by mouth 2 (two) times daily. 02/02/14  Yes Biagio Borg, MD  Cholecalciferol (VITAMIN D3) 1000 UNITS CAPS Take 1,000 Units by mouth daily. For your bones   Yes Historical Provider, MD  feeding supplement, ENSURE ENLIVE, (ENSURE ENLIVE) LIQD Take 237 mLs by mouth 2 (two) times daily between meals. 09/30/16  Yes Charlene Brooke Nche, NP  furosemide (LASIX) 40 MG tablet Take 40 mg by mouth daily.    Yes Historical Provider, MD  Multiple Vitamins-Minerals (MULTIVITAMINS THER. W/MINERALS) TABS Take 1 tablet by mouth daily.   Yes Historical Provider, MD  OVER THE COUNTER MEDICATION Take 8 oz by mouth 2 (two) times daily as needed (for constipation). Patient states she drink about 2 glasses of prune juice a day for constipation   Yes Historical Provider, MD  pantoprazole (PROTONIX) 40 MG tablet Take 1 tablet (40 mg total) by mouth 2 (two) times daily. 09/04/16  Yes Charlene Brooke Nche, NP  ciprofloxacin (CIPRO) 500 MG tablet Take 1 tablet (500 mg total) by mouth every 12 (twelve) hours. 10/03/16   Varney Biles, MD  citalopram (CELEXA) 10 MG tablet Take 1 tablet (10 mg total) by mouth daily. 09/30/16   Flossie Buffy, NP  docusate sodium (COLACE) 100 MG capsule Take 1 capsule (100 mg total) by mouth every 12 (twelve) hours. Patient not taking: Reported on 10/03/2016 05/03/16   Samantha Tripp Dowless, PA-C  metroNIDAZOLE (FLAGYL) 500 MG tablet Take 1 tablet (500 mg total) by mouth 2 (two) times daily. 10/03/16   Varney Biles, MD    Family History Family History  Problem Relation Age of Onset  . Colon cancer Neg Hx     Social History Social History  Substance Use Topics  . Smoking status: Never Smoker    . Smokeless tobacco: Never Used  . Alcohol use No     Allergies   Fentanyl   Review of Systems Review of Systems A complete 10 system review of systems was obtained and all systems are negative except as noted in the HPI and PMH.   Physical Exam Updated Vital Signs BP 126/69 (BP Location: Right Arm)   Pulse 67   Temp 97.8 F (36.6 C) (Oral)   Resp 15   SpO2 95%   Physical Exam  Constitutional: She is oriented to person, place, and time. She appears well-developed and well-nourished. No distress.  HENT:  Head: Normocephalic and atraumatic.  Eyes: EOM are normal.  Neck: Neck supple.  Cardiovascular: Normal rate, regular rhythm and normal heart sounds.  Exam reveals no gallop and no friction rub.   No murmur heard. Pulmonary/Chest: Effort normal and breath sounds normal. No respiratory distress. She has  no wheezes. She has no rales.  Abdominal: Soft. She exhibits no distension. There is tenderness in the left lower quadrant. There is no rebound and no guarding.  LLQ tenderness without rebound or guarding.  Musculoskeletal: Normal range of motion.  Neurological: She is alert and oriented to person, place, and time.  Skin: Skin is warm and dry.  Psychiatric: She has a normal mood and affect. Her behavior is normal.  Nursing note and vitals reviewed.    ED Treatments / Results  DIAGNOSTIC STUDIES: Oxygen Saturation is 100% on RA, nl by my interpretation.    COORDINATION OF CARE: 3:07 AM Discussed treatment plan with pt at bedside which includes morphine, labs, UA, abx Rx and pt agreed to plan.   Labs (all labs ordered are listed, but only abnormal results are displayed) Labs Reviewed  COMPREHENSIVE METABOLIC PANEL - Abnormal; Notable for the following:       Result Value   Glucose, Bld 100 (*)    BUN 25 (*)    Creatinine, Ser 1.09 (*)    GFR calc non Af Amer 41 (*)    GFR calc Af Amer 48 (*)    All other components within normal limits  URINALYSIS, ROUTINE W  REFLEX MICROSCOPIC (NOT AT [facility]) - Abnormal; Notable for the following:    Hgb urine dipstick TRACE (*)    Leukocytes, UA MODERATE (*)    All other components within normal limits  URINE MICROSCOPIC-ADD ON - Abnormal; Notable for the following:    Squamous Epithelial / LPF 0-5 (*)    Bacteria, UA RARE (*)    Casts HYALINE CASTS (*)    All other components within normal limits  CBC WITH DIFFERENTIAL/PLATELET    EKG  EKG Interpretation None       Radiology No results found.  Procedures Procedures (including critical care time)  Medications Ordered in ED Medications  morphine 4 MG/ML injection 4 mg (4 mg Intravenous Given 10/03/16 0409)     Initial Impression / Assessment and Plan / ED Course  I have reviewed the triage vital signs and the nursing notes.  Pertinent labs & imaging results that were available during my care of the patient were reviewed by me and considered in my medical decision making (see chart for details).  Clinical Course     Medical screening examination/treatment/procedure(s) were performed by me as the supervising physician. Scribe service was utilized for documentation only.   Pt comes in with cc of abd pain. Abd pain is L sided constant. Pt has known hx of diverticulosis per last CT. She has some urinary frequency, but it is not new, and there is no dysuria/hematuria. Pt also reports that her pain is worse with movement, and on our exam, pelvis is stable, but there is some tenderness with palpation of the L hip.   Pt's symptoms today are different from her usual pain in that it is present even when supine, and so it's hard to call the pain musculoskeletal. She has constant LLQ abd pain - so we will treat her as clinical diverticulitis. PCP f/u advised, she might benefit from PT. CT scan and MRi from within the last year also reviewed.    Final Clinical Impressions(s) / ED Diagnoses   Final diagnoses:  Diverticulitis of large intestine without  perforation or abscess without bleeding    New Prescriptions New Prescriptions   CIPROFLOXACIN (CIPRO) 500 MG TABLET    Take 1 tablet (500 mg total) by mouth every 12 (twelve)  hours.   METRONIDAZOLE (FLAGYL) 500 MG TABLET    Take 1 tablet (500 mg total) by mouth 2 (two) times daily.   I personally performed the services described in this documentation, which was scribed in my presence. The recorded information has been reviewed and is accurate.     Varney Biles, MD 10/03/16 [phone]

## 2016-10-03 NOTE — ED Triage Notes (Signed)
Pt is c/o lower left abd pain and states it radiates around to her back  Pt states she was seen by her dr yesterday Pt denies N/V/D  Pt states she also has swelling in her lower extremities

## 2016-10-05 ENCOUNTER — Encounter (HOSPITAL_COMMUNITY): Payer: Self-pay

## 2016-10-05 ENCOUNTER — Emergency Department (HOSPITAL_COMMUNITY)
Admission: EM | Admit: 2016-10-05 | Discharge: 2016-10-05 | Disposition: A | Discharge status: Home / Self Care | Payer: Medicare Other | Attending: Emergency Medicine | Admitting: Emergency Medicine

## 2016-10-05 DIAGNOSIS — Z853 Personal history of malignant neoplasm of breast: Secondary | ICD-10-CM | POA: Diagnosis not present

## 2016-10-05 DIAGNOSIS — Z79899 Other long term (current) drug therapy: Secondary | ICD-10-CM | POA: Insufficient documentation

## 2016-10-05 DIAGNOSIS — I5030 Unspecified diastolic (congestive) heart failure: Secondary | ICD-10-CM | POA: Diagnosis not present

## 2016-10-05 DIAGNOSIS — I110 Hypertensive heart disease with heart failure: Secondary | ICD-10-CM | POA: Diagnosis not present

## 2016-10-05 DIAGNOSIS — R58 Hemorrhage, not elsewhere classified: Secondary | ICD-10-CM

## 2016-10-05 DIAGNOSIS — M79601 Pain in right arm: Secondary | ICD-10-CM | POA: Diagnosis present

## 2016-10-05 MED ORDER — ACETAMINOPHEN 325 MG PO TABS
325.0000 mg | ORAL_TABLET | Freq: Once | ORAL | Status: DC
  Filled 2016-10-05: qty 1

## 2016-10-05 NOTE — ED Provider Notes (Signed)
[facility] DEPT Provider Note   CSN: [id number] Arrival date & time: 10/05/16  1532  By signing my name below, I, Heather [name], attest that this documentation has been prepared under the direction and in the presence of [employer], [employer]. Electronically Signed: Gwenlyn [name], ED Scribe. 10/05/16. 4:25 PM.  History   Chief Complaint Chief Complaint  Patient presents with  . Arm Pain   The history is provided by the patient. No language interpreter was used.   HPI Comments: Heather [name] is a 82 y.o. female with PMHx of HTN and breast cancer who presents to the Emergency Department complaining of gradual onset, constant, 9/10 right arm pain onset 2 days PTA. She has been taking Tylenol for pain relief. Pt had blood drawn 2 days ago in the right antecubital area. Afterwards she noticed the bandage was saturated with blood. Pt does not currently take any blood thinners. She reports associated bilateral leg weakness, unintentional weight loss and left arm bruising. Pt has addressed weight loss with PCP. Pt plans to see her PCP on 11/20.  PCP Dr. Deforest Hoyles   Past Medical History:  Diagnosis Date  . Allergic rhinitis, cause unspecified 02/21/2014  . Anxiety state, unspecified   . Breast cancer ([place])    NO STICK OR BLOOD PRESSURE CHECKS IN LEFT ARM  . Carpal tunnel syndrome   . Cervical spondylosis   . Colon polyp 12/19/2009   Transverse-polypoid colorectal mucosa  . Constipation   . Degenerative joint disease   . Diastolic dysfunction   . Diverticula of colon   . Gastritis, chronic   . Hiatal hernia   . Hypertension   . Low back pain   . RBBB (right bundle branch block with left anterior fascicular block)   . Renal cyst   . Venous insufficiency     Patient Active Problem List   Diagnosis Date Noted  . Anxiety about health 05/28/2016  . Abnormal CT scan of lung 07/16/2015  . Abdominal pain 11/30/2014  . Chronic LLQ pain 11/23/2014  . Esophageal dysmotility 10/27/2014    . Cough 10/21/2014  . Other dysphagia 05/09/2014  . Failure to thrive in adult 04/26/2014  . Arthritis of hand, degenerative 04/10/2014  . Other and unspecified hyperlipidemia 04/10/2014  . LPRD (laryngopharyngeal reflux disease) 04/04/2014  . Routine general medical examination at a health care facility 02/21/2014  . Allergic rhinitis 02/21/2014  . Abnormal findings in stool 12/02/2013  . Abdominal pain, left lower quadrant 06/22/2013  . Chronic bronchitis ([place]) 05/30/2013  . Somatization disorder 03/14/2013  . Hx of breast cancer 08/30/2012  . Multiple somatic complaints 06/28/2012  . Rhinitis 05/11/2012  . Edema 06/12/2011  . GERD 03/27/2010  . DIZZINESS 08/31/2009  . Diastolic CHF ([place]) Q000111Q  . FLANK PAIN, LEFT 03/21/2009  . WEAKNESS 02/21/2009  . CONSTIPATION 01/18/2009  . FLATULENCE-GAS-BLOATING 01/18/2009  . CARPAL TUNNEL SYNDROME, BILATERAL 10/03/2008  . Cervical spondylosis without myelopathy 03/21/2008  . Diaphragmatic hernia 12/26/2007  . RENAL CYST 12/26/2007  . Anxiety state 09/15/2007  . Essential hypertension 09/15/2007  . VENOUS INSUFFICIENCY 09/15/2007  . DEGENERATIVE JOINT DISEASE 09/15/2007  . LOW BACK PAIN, CHRONIC 09/15/2007  . DIVERTICULOSIS, COLON 05/24/2002    Past Surgical History:  Procedure Laterality Date  . CARPAL TUNNEL RELEASE    . Left mastectomy      OB History    No data available     Home Medications    Prior to Admission medications   Medication Sig Start Date End  Date Taking? Authorizing Provider  acetaminophen (TYLENOL) 500 MG tablet Take 2 tablets (1,000 mg total) by mouth every 6 (six) hours as needed. Patient taking differently: Take 1,000 mg by mouth every 6 (six) hours as needed for mild pain.  09/30/16   Flossie Buffy, NP  ALPRAZolam Duanne Moron) 0.5 MG tablet Take 0.25-0.5 mg by mouth 3 (three) times daily as needed for anxiety or sleep.     Historical Provider, MD  carvedilol (COREG) 3.125 MG tablet Take 1  tablet (3.125 mg total) by mouth 2 (two) times daily. 02/02/14   Biagio Borg, MD  Cholecalciferol (VITAMIN D3) 1000 UNITS CAPS Take 1,000 Units by mouth daily. For your bones    Historical Provider, MD  ciprofloxacin (CIPRO) 500 MG tablet Take 1 tablet (500 mg total) by mouth every 12 (twelve) hours. 10/03/16   Varney Biles, MD  citalopram (CELEXA) 10 MG tablet Take 1 tablet (10 mg total) by mouth daily. 09/30/16   Flossie Buffy, NP  docusate sodium (COLACE) 100 MG capsule Take 1 capsule (100 mg total) by mouth every 12 (twelve) hours. Patient not taking: Reported on 10/03/2016 05/03/16   Samantha Tripp Dowless, PA-C  feeding supplement, ENSURE ENLIVE, (ENSURE ENLIVE) LIQD Take 237 mLs by mouth 2 (two) times daily between meals. 09/30/16   Flossie Buffy, NP  furosemide (LASIX) 40 MG tablet Take 40 mg by mouth daily.     Historical Provider, MD  metroNIDAZOLE (FLAGYL) 500 MG tablet Take 1 tablet (500 mg total) by mouth 2 (two) times daily. 10/03/16   Varney Biles, MD  Multiple Vitamins-Minerals (MULTIVITAMINS THER. W/MINERALS) TABS Take 1 tablet by mouth daily.    Historical Provider, MD  OVER THE COUNTER MEDICATION Take 8 oz by mouth 2 (two) times daily as needed (for constipation). Patient states she drink about 2 glasses of prune juice a day for constipation    Historical Provider, MD  pantoprazole (PROTONIX) 40 MG tablet Take 1 tablet (40 mg total) by mouth 2 (two) times daily. 09/04/16   Flossie Buffy, NP    Family History Family History  Problem Relation Age of Onset  . Colon cancer Neg Hx     Social History Social History  Substance Use Topics  . Smoking status: Never Smoker  . Smokeless tobacco: Never Used  . Alcohol use No    Allergies   Fentanyl  Review of Systems Review of Systems  10 Systems reviewed and are negative for acute change except as noted in the HPI.   Physical Exam Updated Vital Signs BP 155/68 (BP Location: Right Arm)   Pulse 78   Temp  97.6 F (36.4 C) (Oral)   Resp 18   Ht 5' (1.524 m)   Wt 49.9 kg   SpO2 98%   BMI 21.48 kg/m   Physical Exam  Constitutional: She is oriented to person, place, and time. She appears well-developed and well-nourished. No distress.  HENT:  Head: Normocephalic and atraumatic.  Mouth/Throat: Oropharynx is clear and moist.  Eyes: Conjunctivae and EOM are normal. Pupils are equal, round, and reactive to light.  Neck: Normal range of motion.  Cardiovascular: Normal rate, regular rhythm and intact distal pulses.   Pulmonary/Chest: Effort normal and breath sounds normal.  Abdominal: Soft. There is no tenderness.  Musculoskeletal: Normal range of motion.  Neurological: She is alert and oriented to person, place, and time.  Skin: She is not diaphoretic.  Ecchymoses to left antecubital area. No warmth. Radial pulse 2+, compartments  soft  Psychiatric: She has a normal mood and affect.  Nursing note and vitals reviewed.    ED Treatments / Results  DIAGNOSTIC STUDIES: Oxygen Saturation is 98% on RA, normal by my interpretation.    COORDINATION OF CARE: 4:22 PM Discussed treatment plan with pt at bedside which includes Tylenol and pt agreed to plan.     Labs (all labs ordered are listed, but only abnormal results are displayed) Labs Reviewed - No data to display  EKG  EKG Interpretation None       Radiology No results found.  Procedures Procedures (including critical care time)  Medications Ordered in ED Medications  acetaminophen (TYLENOL) tablet 325 mg (325 mg Oral Not Given 10/05/16 1653)     Initial Impression / Assessment and Plan / ED Course  I have reviewed the triage vital signs and the nursing notes.  Pertinent labs & imaging results that were available during my care of the patient were reviewed by me and considered in my medical decision making (see chart for details).  Clinical Course    Vitals:   10/05/16 1540 10/05/16 1541  BP:  155/68  Pulse:  78   Resp:  18  Temp:  97.6 F (36.4 C)  TempSrc:  Oral  SpO2:  98%  Weight: 49.9 kg   Height: 5' (1.524 m)     Medications  acetaminophen (TYLENOL) tablet 325 mg (325 mg Oral Not Given 10/05/16 1653)    DENARIA TUNIS is 82 y.o. female presenting with ecchymosis to left antecubital area from multiple IV attempts several days ago. Radial pulse intact, excellent range of motion, compartments are soft, counseled this patient to apply ice and then switch to heat after a few days. She can also take Tylenol at home for comfort.  Evaluation does not show pathology that would require ongoing emergent intervention or inpatient treatment. Pt is hemodynamically stable and mentating appropriately. Discussed findings and plan with patient/guardian, who agrees with care plan. All questions answered. Return precautions discussed and outpatient follow up given.    Final Clinical Impressions(s) / ED Diagnoses   Final diagnoses:  Ecchymosis    New Prescriptions Discharge Medication List as of 10/05/2016  4:48 PM     I personally performed the services described in this documentation, which was scribed in my presence. The recorded information has been reviewed and is accurate.    Monico Blitz, PA-C 10/05/16 1709    Virgel Manifold, MD 10/13/16 1151

## 2016-10-05 NOTE — Discharge Instructions (Signed)
Rest, Ice intermittently (in the first 24-48 hours), Gentle compression with an Ace wrap, and elevate (Limb above the level of the heart) °  °Please follow with your primary care doctor in the next 2 days for a check-up. They must obtain records for further management.  ° °Do not hesitate to return to the Emergency Department for any new, worsening or concerning symptoms.  ° ° ° °

## 2016-10-05 NOTE — ED Notes (Signed)
Pt reports she was seen at [facility] ED Friday, had IV started in her R AC, states she started to have bruising in that same area.  Pt states "they put something else in there other than just starting an IV."

## 2016-10-05 NOTE — ED Triage Notes (Signed)
Patient had blood drawn 3 days ago in the right antecubital area. Patient c/o pain and bruising to the right antecubital area. No swelling noted. Right radial pulse present.

## 2016-10-08 DIAGNOSIS — R109 Unspecified abdominal pain: Secondary | ICD-10-CM | POA: Diagnosis not present

## 2016-10-08 DIAGNOSIS — G8929 Other chronic pain: Secondary | ICD-10-CM | POA: Diagnosis not present

## 2016-10-08 DIAGNOSIS — M519 Unspecified thoracic, thoracolumbar and lumbosacral intervertebral disc disorder: Secondary | ICD-10-CM | POA: Diagnosis not present

## 2016-10-08 DIAGNOSIS — I10 Essential (primary) hypertension: Secondary | ICD-10-CM | POA: Diagnosis not present

## 2016-10-08 DIAGNOSIS — I872 Venous insufficiency (chronic) (peripheral): Secondary | ICD-10-CM | POA: Diagnosis not present

## 2016-10-08 DIAGNOSIS — N183 Chronic kidney disease, stage 3 (moderate): Secondary | ICD-10-CM | POA: Diagnosis not present

## 2016-10-08 DIAGNOSIS — I5030 Unspecified diastolic (congestive) heart failure: Secondary | ICD-10-CM | POA: Diagnosis not present

## 2016-10-08 DIAGNOSIS — K5900 Constipation, unspecified: Secondary | ICD-10-CM | POA: Diagnosis not present

## 2016-10-13 ENCOUNTER — Encounter (HOSPITAL_COMMUNITY): Payer: Self-pay | Admitting: Emergency Medicine

## 2016-10-13 ENCOUNTER — Emergency Department (HOSPITAL_COMMUNITY)
Admission: EM | Admit: 2016-10-13 | Discharge: 2016-10-13 | Disposition: A | Discharge status: Home / Self Care | Payer: Medicare Other | Attending: Emergency Medicine | Admitting: Emergency Medicine

## 2016-10-13 DIAGNOSIS — X58XXXA Exposure to other specified factors, initial encounter: Secondary | ICD-10-CM | POA: Insufficient documentation

## 2016-10-13 DIAGNOSIS — Y92410 Unspecified street and highway as the place of occurrence of the external cause: Secondary | ICD-10-CM | POA: Diagnosis not present

## 2016-10-13 DIAGNOSIS — S6991XA Unspecified injury of right wrist, hand and finger(s), initial encounter: Secondary | ICD-10-CM | POA: Diagnosis present

## 2016-10-13 DIAGNOSIS — Y999 Unspecified external cause status: Secondary | ICD-10-CM | POA: Insufficient documentation

## 2016-10-13 DIAGNOSIS — I10 Essential (primary) hypertension: Secondary | ICD-10-CM | POA: Insufficient documentation

## 2016-10-13 DIAGNOSIS — M79601 Pain in right arm: Secondary | ICD-10-CM | POA: Diagnosis not present

## 2016-10-13 DIAGNOSIS — S60221A Contusion of right hand, initial encounter: Secondary | ICD-10-CM | POA: Diagnosis not present

## 2016-10-13 DIAGNOSIS — Y939 Activity, unspecified: Secondary | ICD-10-CM | POA: Diagnosis not present

## 2016-10-13 DIAGNOSIS — Z853 Personal history of malignant neoplasm of breast: Secondary | ICD-10-CM | POA: Diagnosis not present

## 2016-10-13 MED ORDER — DICLOFENAC SODIUM 1 % TD GEL: 2.0000 g | Freq: Two times a day (BID) | TRANSDERMAL | 0 refills | Status: DC

## 2016-10-13 NOTE — ED Triage Notes (Signed)
Pt presents to ED c/o Pain in R AC after having blood drawn three times "a few days ago." Pt was already seen for this on the 19th. No swelling noted. Radial pulse strong. Pt has full movement. A&Ox4 and ambulatory with walker.

## 2016-10-13 NOTE — ED Provider Notes (Signed)
[facility] DEPT Provider Note   CSN: [id number] Arrival date & time: 10/13/16  1331     History   Chief Complaint Chief Complaint  Patient presents with  . Arm Pain    HPI Heather [name] is a 82 y.o. female who presents with right forearm pain. She states this all stemmed from when she came to the ER on 11/17 when she had an IV inserted. She has been having pain since then and was seen two days after on 11/19 and diagnosed with a bruise. She states that she continues to have pain but it is getting better. She is taking Tylenol with minimal relief. She has been using ice packs as well. No fever, redness, drainage, bleeding.  HPI  Past Medical History:  Diagnosis Date  . Allergic rhinitis, cause unspecified 02/21/2014  . Anxiety state, unspecified   . Breast cancer ([place])    NO STICK OR BLOOD PRESSURE CHECKS IN LEFT ARM  . Carpal tunnel syndrome   . Cervical spondylosis   . Colon polyp 12/19/2009   Transverse-polypoid colorectal mucosa  . Constipation   . Degenerative joint disease   . Diastolic dysfunction   . Diverticula of colon   . Gastritis, chronic   . Hiatal hernia   . Hypertension   . Low back pain   . RBBB (right bundle branch block with left anterior fascicular block)   . Renal cyst   . Venous insufficiency     Patient Active Problem List   Diagnosis Date Noted  . Anxiety about health 05/28/2016  . Abnormal CT scan of lung 07/16/2015  . Abdominal pain 11/30/2014  . Chronic LLQ pain 11/23/2014  . Esophageal dysmotility 10/27/2014  . Cough 10/21/2014  . Other dysphagia 05/09/2014  . Failure to thrive in adult 04/26/2014  . Arthritis of hand, degenerative 04/10/2014  . Other and unspecified hyperlipidemia 04/10/2014  . LPRD (laryngopharyngeal reflux disease) 04/04/2014  . Routine general medical examination at a health care facility 02/21/2014  . Allergic rhinitis 02/21/2014  . Abnormal findings in stool 12/02/2013  . Abdominal pain, left lower  quadrant 06/22/2013  . Chronic bronchitis ([place]) 05/30/2013  . Somatization disorder 03/14/2013  . Hx of breast cancer 08/30/2012  . Multiple somatic complaints 06/28/2012  . Rhinitis 05/11/2012  . Edema 06/12/2011  . GERD 03/27/2010  . DIZZINESS 08/31/2009  . Diastolic CHF ([place]) Q000111Q  . FLANK PAIN, LEFT 03/21/2009  . WEAKNESS 02/21/2009  . CONSTIPATION 01/18/2009  . FLATULENCE-GAS-BLOATING 01/18/2009  . CARPAL TUNNEL SYNDROME, BILATERAL 10/03/2008  . Cervical spondylosis without myelopathy 03/21/2008  . Diaphragmatic hernia 12/26/2007  . RENAL CYST 12/26/2007  . Anxiety state 09/15/2007  . Essential hypertension 09/15/2007  . VENOUS INSUFFICIENCY 09/15/2007  . DEGENERATIVE JOINT DISEASE 09/15/2007  . LOW BACK PAIN, CHRONIC 09/15/2007  . DIVERTICULOSIS, COLON 05/24/2002    Past Surgical History:  Procedure Laterality Date  . CARPAL TUNNEL RELEASE    . Left mastectomy      OB History    No data available       Home Medications    Prior to Admission medications   Medication Sig Start Date End Date Taking? Authorizing Provider  acetaminophen (TYLENOL) 500 MG tablet Take 2 tablets (1,000 mg total) by mouth every 6 (six) hours as needed. Patient taking differently: Take 1,000 mg by mouth every 6 (six) hours as needed for mild pain.  09/30/16   Flossie Buffy, NP  ALPRAZolam Duanne Moron) 0.5 MG tablet Take 0.25-0.5 mg by mouth 3 (  three) times daily as needed for anxiety or sleep.     Historical Provider, MD  carvedilol (COREG) 3.125 MG tablet Take 1 tablet (3.125 mg total) by mouth 2 (two) times daily. 02/02/14   Biagio Borg, MD  Cholecalciferol (VITAMIN D3) 1000 UNITS CAPS Take 1,000 Units by mouth daily. For your bones    Historical Provider, MD  ciprofloxacin (CIPRO) 500 MG tablet Take 1 tablet (500 mg total) by mouth every 12 (twelve) hours. 10/03/16   Varney Biles, MD  citalopram (CELEXA) 10 MG tablet Take 1 tablet (10 mg total) by mouth daily. 09/30/16    Flossie Buffy, NP  feeding supplement, ENSURE ENLIVE, (ENSURE ENLIVE) LIQD Take 237 mLs by mouth 2 (two) times daily between meals. 09/30/16   Flossie Buffy, NP  furosemide (LASIX) 40 MG tablet Take 40 mg by mouth daily.     Historical Provider, MD  metroNIDAZOLE (FLAGYL) 500 MG tablet Take 1 tablet (500 mg total) by mouth 2 (two) times daily. 10/03/16   Varney Biles, MD  Multiple Vitamins-Minerals (MULTIVITAMINS THER. W/MINERALS) TABS Take 1 tablet by mouth daily.    Historical Provider, MD  OVER THE COUNTER MEDICATION Take 8 oz by mouth 2 (two) times daily as needed (for constipation). Patient states she drink about 2 glasses of prune juice a day for constipation    Historical Provider, MD  pantoprazole (PROTONIX) 40 MG tablet Take 1 tablet (40 mg total) by mouth 2 (two) times daily. 09/04/16   Flossie Buffy, NP    Family History Family History  Problem Relation Age of Onset  . Colon cancer Neg Hx     Social History Social History  Substance Use Topics  . Smoking status: Never Smoker  . Smokeless tobacco: Never Used  . Alcohol use No     Allergies   Fentanyl   Review of Systems Review of Systems  Musculoskeletal: Positive for myalgias.  Skin: Positive for color change. Negative for wound.     Physical Exam Updated Vital Signs BP 115/69 (BP Location: Left Arm)   Pulse 74   Temp 97.6 F (36.4 C) (Oral)   Resp 15   SpO2 96%   Physical Exam  Constitutional: She is oriented to person, place, and time. She appears well-developed and well-nourished. No distress.  NAD, talkative.  HENT:  Head: Normocephalic and atraumatic.  Eyes: Conjunctivae are normal. Pupils are equal, round, and reactive to light. Right eye exhibits no discharge. Left eye exhibits no discharge. No scleral icterus.  Neck: Normal range of motion.  Cardiovascular: Normal rate.   Pulmonary/Chest: Effort normal. No respiratory distress.  Abdominal: She exhibits no distension.    Musculoskeletal:  Right forearm: Minimal bruising and tenderness to palpation of anterior mid-forearm. No evidence of infection. No obvious swelling or deformity. FROM of elbow and wrist. N/V intact.   Neurological: She is alert and oriented to person, place, and time.  Skin: Skin is warm and dry.  Psychiatric: She has a normal mood and affect. Her behavior is normal.  Nursing note and vitals reviewed.    ED Treatments / Results  Labs (all labs ordered are listed, but only abnormal results are displayed) Labs Reviewed - No data to display  EKG  EKG Interpretation None       Radiology No results found.  Procedures Procedures (including critical care time)  Medications Ordered in ED Medications - No data to display   Initial Impression / Assessment and Plan / ED Course  I  have reviewed the triage vital signs and the nursing notes.  Pertinent labs & imaging results that were available during my care of the patient were reviewed by me and considered in my medical decision making (see chart for details).  Clinical Course    82 year old female with ongoing pain of the forearm. Patient is afebrile, not tachycardic or tachypneic, normotensive, and not hypoxic. Unclear of why she is still having pain however patient does state pain is getting better. Rx given for Voltaren gel and advised continue Tylenol and ice packs as needed. Shared visit with Dr. Laneta Simmers. Patient is NAD, non-toxic, with stable VS. Patient is informed of clinical course, understands medical decision making process, and agrees with plan. Opportunity for questions provided and all questions answered. Return precautions given.   Final Clinical Impressions(s) / ED Diagnoses   Final diagnoses:  Right arm pain    New Prescriptions Current Discharge Medication List       Recardo Evangelist, PA-C 10/14/16 1244    Leo Grosser, MD 10/14/16 1535

## 2016-10-17 ENCOUNTER — Ambulatory Visit (INDEPENDENT_AMBULATORY_CARE_PROVIDER_SITE_OTHER): Payer: Medicare Other | Admitting: Nurse Practitioner

## 2016-10-17 ENCOUNTER — Encounter: Payer: Self-pay | Admitting: Nurse Practitioner

## 2016-10-17 VITALS — BP 138/54 | HR 87 | Temp 97.2°F | Wt 120.0 lb

## 2016-10-17 DIAGNOSIS — K5730 Diverticulosis of large intestine without perforation or abscess without bleeding: Secondary | ICD-10-CM

## 2016-10-17 DIAGNOSIS — G8929 Other chronic pain: Secondary | ICD-10-CM

## 2016-10-17 DIAGNOSIS — I5032 Chronic diastolic (congestive) heart failure: Secondary | ICD-10-CM | POA: Diagnosis not present

## 2016-10-17 DIAGNOSIS — J410 Simple chronic bronchitis: Secondary | ICD-10-CM

## 2016-10-17 DIAGNOSIS — M545 Low back pain, unspecified: Secondary | ICD-10-CM

## 2016-10-17 DIAGNOSIS — M159 Polyosteoarthritis, unspecified: Secondary | ICD-10-CM

## 2016-10-17 DIAGNOSIS — R1084 Generalized abdominal pain: Secondary | ICD-10-CM | POA: Diagnosis not present

## 2016-10-17 DIAGNOSIS — F411 Generalized anxiety disorder: Secondary | ICD-10-CM

## 2016-10-17 DIAGNOSIS — R609 Edema, unspecified: Secondary | ICD-10-CM

## 2016-10-17 DIAGNOSIS — R627 Adult failure to thrive: Secondary | ICD-10-CM

## 2016-10-17 DIAGNOSIS — I10 Essential (primary) hypertension: Secondary | ICD-10-CM | POA: Diagnosis not present

## 2016-10-17 MED ORDER — CIPROFLOXACIN HCL 500 MG PO TABS: 500.0000 mg | ORAL_TABLET | Freq: Two times a day (BID) | ORAL | 0 refills | Status: DC

## 2016-10-17 MED ORDER — CITALOPRAM HYDROBROMIDE 10 MG PO TABS: 10.0000 mg | ORAL_TABLET | Freq: Every day | ORAL | 3 refills | Status: DC

## 2016-10-17 MED ORDER — METRONIDAZOLE 500 MG PO TABS: 500.0000 mg | ORAL_TABLET | Freq: Two times a day (BID) | ORAL | 0 refills | Status: DC

## 2016-10-17 NOTE — Patient Instructions (Addendum)
I am concerned about patient's home situation. She states her son works all the time but checks on her when he can. I think she needs additional assistance at home with ADLs and medication management, but have not been successful in resuming home health services. Due to recent multiple ED visits, I entered a referral to [facility] and social services for assistance in finding community. Resources.  I contacted patient's pharmacy to inquire if prescriptions were filled. I was informed by pharmacist and patient that prescription for Cipro and Flagyl and Celexa were never filled. I resend prescriptions and advised patient to pick prescriptions and start today.

## 2016-10-17 NOTE — Progress Notes (Signed)
Subjective:  Patient ID: Heather [name], female    DOB: [birth year]  Age: 82 y.o. MRN: [id number]  CC: Follow-up (1 mo fu)   HPI Hospital follow-up: Heather [name] office today to follow-up after 3 emergency room visits in the last 2 weeks. 10/03/2016 she was seen for abdominal pain, diagnosed with diverticulitis, prescribed Cipro and Flagyl. She admits to not taking antibiotics because she was not aware of prescriptions. 10/05/2016 she was seen for right arm bruise, it was determined that this was as a result of venipuncture during previous ED visit 2 days ago. Advised to apply cold compress and elevate. 10/13/2016 she returned complaining of right arm pain, she was advised it was as a result of venipuncture done 1 week prior, Voltaren gel was prescribed. Hospital lab results reviewed today.  Heather [name] complains today of persistent left lower quadrant tenderness, she denies any nausea or vomiting or diarrhea or blood in stool, or fever or dysuria or urinary frequency. She denies any change in appetite.  Outpatient Medications Prior to Visit  Medication Sig Dispense Refill  . acetaminophen (TYLENOL) 500 MG tablet Take 2 tablets (1,000 mg total) by mouth every 6 (six) hours as needed. (Patient taking differently: Take 1,000 mg by mouth every 6 (six) hours as needed for mild pain. ) 30 tablet 1  . ALPRAZolam (XANAX) 0.5 MG tablet Take 0.25-0.5 mg by mouth 3 (three) times daily as needed for anxiety or sleep.     . carvedilol (COREG) 3.125 MG tablet Take 1 tablet (3.125 mg total) by mouth 2 (two) times daily. 180 tablet 3  . Cholecalciferol (VITAMIN D3) 1000 UNITS CAPS Take 1,000 Units by mouth daily. For your bones    . diclofenac sodium (VOLTAREN) 1 % GEL Apply 2 g topically 2 (two) times daily. 100 g 0  . feeding supplement, ENSURE ENLIVE, (ENSURE ENLIVE) LIQD Take 237 mLs by mouth 2 (two) times daily between meals. 237 mL 12  . furosemide (LASIX) 40 MG tablet Take 40 mg by mouth daily.      . Multiple Vitamins-Minerals (MULTIVITAMINS THER. W/MINERALS) TABS Take 1 tablet by mouth daily.    Marland Kitchen OVER THE COUNTER MEDICATION Take 8 oz by mouth 2 (two) times daily as needed (for constipation). Patient states she drink about 2 glasses of prune juice a day for constipation    . pantoprazole (PROTONIX) 40 MG tablet Take 1 tablet (40 mg total) by mouth 2 (two) times daily. 60 tablet 11  . citalopram (CELEXA) 10 MG tablet Take 1 tablet (10 mg total) by mouth daily. 30 tablet 3  . ciprofloxacin (CIPRO) 500 MG tablet Take 1 tablet (500 mg total) by mouth every 12 (twelve) hours. (Patient not taking: Reported on 10/17/2016) 10 tablet 0  . metroNIDAZOLE (FLAGYL) 500 MG tablet Take 1 tablet (500 mg total) by mouth 2 (two) times daily. (Patient not taking: Reported on 10/17/2016) 14 tablet 0   No facility-administered medications prior to visit.     ROS See HPI  Objective:  BP (!) 138/54   Pulse 87   Temp 97.2 F (36.2 C)   Wt 120 lb (54.4 kg)   SpO2 98%   BMI 23.44 kg/m   BP Readings from Last 3 Encounters:  10/17/16 (!) 138/54  10/13/16 121/68  10/05/16 155/68    Wt Readings from Last 3 Encounters:  10/17/16 120 lb (54.4 kg)  10/05/16 110 lb (49.9 kg)  09/30/16 121 lb (54.9 kg)    Physical Exam  Neck:  Normal range of motion. Neck supple.  Cardiovascular: Normal rate and normal heart sounds.   Pulmonary/Chest: Effort normal.  Abdominal: Soft. She exhibits no distension. There is tenderness. There is no rebound and no guarding.  Musculoskeletal: She exhibits edema.  Neurological: She is alert.  Skin: Skin is warm and dry.  Vitals reviewed.   Lab Results  Component Value Date   WBC 6.3 10/03/2016   HGB 13.2 10/03/2016   HCT 40.0 10/03/2016   PLT 210 10/03/2016   GLUCOSE 100 (H) 10/03/2016   CHOL 199 02/02/2014   TRIG 20.0 02/02/2014   HDL 98.70 02/02/2014   LDLCALC 96 02/02/2014   ALT 21 10/03/2016   AST 32 10/03/2016   NA 143 10/03/2016   K 3.5 10/03/2016   CL  106 10/03/2016   CREATININE 1.09 (H) 10/03/2016   BUN 25 (H) 10/03/2016   CO2 29 10/03/2016   TSH 2.67 11/30/2014    No results found.  Assessment & Plan:   Heather [name] was seen today for follow-up.  Diagnoses and all orders for this visit:  Generalized abdominal pain -     Discontinue: ciprofloxacin (CIPRO) 500 MG tablet; Take 1 tablet (500 mg total) by mouth every 12 (twelve) hours. -     Discontinue: metroNIDAZOLE (FLAGYL) 500 MG tablet; Take 1 tablet (500 mg total) by mouth 2 (two) times daily. -     ciprofloxacin (CIPRO) 500 MG tablet; Take 1 tablet (500 mg total) by mouth every 12 (twelve) hours. -     metroNIDAZOLE (FLAGYL) 500 MG tablet; Take 1 tablet (500 mg total) by mouth 2 (two) times daily.  Diverticulosis of colon -     Discontinue: ciprofloxacin (CIPRO) 500 MG tablet; Take 1 tablet (500 mg total) by mouth every 12 (twelve) hours. -     Discontinue: metroNIDAZOLE (FLAGYL) 500 MG tablet; Take 1 tablet (500 mg total) by mouth 2 (two) times daily. -     ciprofloxacin (CIPRO) 500 MG tablet; Take 1 tablet (500 mg total) by mouth every 12 (twelve) hours. -     metroNIDAZOLE (FLAGYL) 500 MG tablet; Take 1 tablet (500 mg total) by mouth 2 (two) times daily.  Anxiety state -     citalopram (CELEXA) 10 MG tablet; Take 1 tablet (10 mg total) by mouth daily.  Chronic diastolic congestive heart failure (HCC) -     AMB Referral to [facility] -     Ambulatory referral to Social Work  Essential hypertension -     AMB Referral to [facility] [facility] -     Ambulatory referral to Social Work  Simple chronic bronchitis ([place]) -     AMB Referral to [facility] -     Ambulatory referral to Social Work  Osteoarthritis of multiple joints, unspecified osteoarthritis type -     AMB Referral to [facility] -     Ambulatory referral to Social Work  Edema, unspecified type -     AMB Referral to [facility] -     Ambulatory referral to Social  Work  Failure to thrive in adult -     AMB Referral to [facility] -     Ambulatory referral to Social Work  Chronic bilateral low back pain without sciatica -     AMB Referral to [facility] -     Ambulatory referral to Social Work   I am having Heather [name] maintain her Vitamin D3, multivitamins ther. w/minerals, carvedilol, ALPRAZolam, furosemide,  OVER THE COUNTER MEDICATION, pantoprazole, feeding supplement (ENSURE ENLIVE), acetaminophen, diclofenac sodium, citalopram, ciprofloxacin, and metroNIDAZOLE.  Meds ordered this encounter  Medications  . DISCONTD: ciprofloxacin (CIPRO) 500 MG tablet    Sig: Take 1 tablet (500 mg total) by mouth every 12 (twelve) hours.    Dispense:  10 tablet    Refill:  0    Order Specific Question:   Supervising Provider    Answer:   Cassandria Anger [[id number]]  . citalopram (CELEXA) 10 MG tablet    Sig: Take 1 tablet (10 mg total) by mouth daily.    Dispense:  30 tablet    Refill:  3    Order Specific Question:   Supervising Provider    Answer:   Cassandria Anger [[id number]]  . DISCONTD: metroNIDAZOLE (FLAGYL) 500 MG tablet    Sig: Take 1 tablet (500 mg total) by mouth 2 (two) times daily.    Dispense:  14 tablet    Refill:  0    Order Specific Question:   Supervising Provider    Answer:   Cassandria Anger [[id number]]  . ciprofloxacin (CIPRO) 500 MG tablet    Sig: Take 1 tablet (500 mg total) by mouth every 12 (twelve) hours.    Dispense:  10 tablet    Refill:  0    Order Specific Question:   Supervising Provider    Answer:   Cassandria Anger [[id number]]  . metroNIDAZOLE (FLAGYL) 500 MG tablet    Sig: Take 1 tablet (500 mg total) by mouth 2 (two) times daily.    Dispense:  14 tablet    Refill:  0    Order Specific Question:   Supervising Provider    Answer:   Cassandria Anger [[id number]]    Follow-up: Return in about 3 months (around 01/15/2017) for chronic care.  Wilfred Lacy, NP

## 2016-10-17 NOTE — Progress Notes (Signed)
Pre visit review using our clinic review tool, if applicable. No additional management support is needed unless otherwise documented below in the visit note. 

## 2016-10-17 NOTE — Assessment & Plan Note (Signed)
I am concerned about patient's home situation. She states her son works all the time but checks on her when he can. I think she needs additional assistance at home with ADLs and medication management, but have not been successful in resuming home health services. Due to recent multiple ED visits, I entered a referral to [facility] and social services for assistance in finding community Resources.

## 2016-10-24 ENCOUNTER — Ambulatory Visit: Payer: Medicare Other | Admitting: Nurse Practitioner

## 2016-10-28 DIAGNOSIS — I8310 Varicose veins of unspecified lower extremity with inflammation: Secondary | ICD-10-CM | POA: Diagnosis not present

## 2016-10-30 ENCOUNTER — Ambulatory Visit (INDEPENDENT_AMBULATORY_CARE_PROVIDER_SITE_OTHER): Payer: Medicare Other | Admitting: Nurse Practitioner

## 2016-10-30 ENCOUNTER — Encounter: Payer: Self-pay | Admitting: Nurse Practitioner

## 2016-10-30 VITALS — BP 124/64 | HR 65 | Temp 97.6°F | Wt 125.0 lb

## 2016-10-30 DIAGNOSIS — K5730 Diverticulosis of large intestine without perforation or abscess without bleeding: Secondary | ICD-10-CM | POA: Diagnosis not present

## 2016-10-30 DIAGNOSIS — Z79899 Other long term (current) drug therapy: Secondary | ICD-10-CM

## 2016-10-30 DIAGNOSIS — R1084 Generalized abdominal pain: Secondary | ICD-10-CM | POA: Diagnosis not present

## 2016-10-30 MED ORDER — CIPROFLOXACIN 500 MG/5ML (10%) PO SUSR: 500.0000 mg | Freq: Two times a day (BID) | ORAL | 0 refills | Status: DC

## 2016-10-30 MED ORDER — METRONIDAZOLE 50 MG/ML ORAL SUSPENSION: 500.0000 mg | Freq: Three times a day (TID) | ORAL | 0 refills | Status: DC

## 2016-10-30 NOTE — Progress Notes (Signed)
Subjective:  Patient ID: Heather [name], female    DOB: July 30, 1918  Age: 82 y.o. MRN: [id number]  CC: Follow-up (med consult)   HPI Has not been able to take oral abx for diverticulitis and ABD pain due to its large size. She wants medications changed to liquid. Denies any diarrhea or blood in stool of worsening ABD pain or fever.  Outpatient Medications Prior to Visit  Medication Sig Dispense Refill  . acetaminophen (TYLENOL) 500 MG tablet Take 2 tablets (1,000 mg total) by mouth every 6 (six) hours as needed. (Patient taking differently: Take 1,000 mg by mouth every 6 (six) hours as needed for mild pain. ) 30 tablet 1  . ALPRAZolam (XANAX) 0.5 MG tablet Take 0.25-0.5 mg by mouth 3 (three) times daily as needed for anxiety or sleep.     . carvedilol (COREG) 3.125 MG tablet Take 1 tablet (3.125 mg total) by mouth 2 (two) times daily. 180 tablet 3  . Cholecalciferol (VITAMIN D3) 1000 UNITS CAPS Take 1,000 Units by mouth daily. For your bones    . diclofenac sodium (VOLTAREN) 1 % GEL Apply 2 g topically 2 (two) times daily. 100 g 0  . feeding supplement, ENSURE ENLIVE, (ENSURE ENLIVE) LIQD Take 237 mLs by mouth 2 (two) times daily between meals. 237 mL 12  . furosemide (LASIX) 40 MG tablet Take 40 mg by mouth daily.     . Multiple Vitamins-Minerals (MULTIVITAMINS THER. W/MINERALS) TABS Take 1 tablet by mouth daily.    Marland Kitchen OVER THE COUNTER MEDICATION Take 8 oz by mouth 2 (two) times daily as needed (for constipation). Patient states she drink about 2 glasses of prune juice a day for constipation    . pantoprazole (PROTONIX) 40 MG tablet Take 1 tablet (40 mg total) by mouth 2 (two) times daily. 60 tablet 11  . citalopram (CELEXA) 10 MG tablet Take 1 tablet (10 mg total) by mouth daily. 30 tablet 3  . metroNIDAZOLE (FLAGYL) 500 MG tablet Take 1 tablet (500 mg total) by mouth 2 (two) times daily. 14 tablet 0  . ciprofloxacin (CIPRO) 500 MG tablet Take 1 tablet (500 mg total) by mouth every 12  (twelve) hours. (Patient not taking: Reported on 10/30/2016) 10 tablet 0   No facility-administered medications prior to visit.     ROS See HPI  Objective:  BP 124/64   Pulse 65   Temp 97.6 F (36.4 C)   Wt 125 lb (56.7 kg)   SpO2 99%   BMI 24.41 kg/m   BP Readings from Last 3 Encounters:  10/30/16 124/64  10/17/16 (!) 138/54  10/13/16 121/68    Wt Readings from Last 3 Encounters:  10/30/16 125 lb (56.7 kg)  10/17/16 120 lb (54.4 kg)  10/05/16 110 lb (49.9 kg)    Physical Exam  Constitutional: She is oriented to person, place, and time. No distress.  Cardiovascular: Normal rate.   Pulmonary/Chest: Effort normal.  Abdominal: Soft. Bowel sounds are normal. She exhibits no distension. There is no rebound and no guarding.  Neurological: She is alert and oriented to person, place, and time.  Skin: Skin is warm and dry.  Vitals reviewed.   Lab Results  Component Value Date   WBC 6.3 10/03/2016   HGB 13.2 10/03/2016   HCT 40.0 10/03/2016   PLT 210 10/03/2016   GLUCOSE 100 (H) 10/03/2016   CHOL 199 02/02/2014   TRIG 20.0 02/02/2014   HDL 98.70 02/02/2014   LDLCALC 96 02/02/2014   ALT  21 10/03/2016   AST 32 10/03/2016   NA 143 10/03/2016   K 3.5 10/03/2016   CL 106 10/03/2016   CREATININE 1.09 (H) 10/03/2016   BUN 25 (H) 10/03/2016   CO2 29 10/03/2016   TSH 2.67 11/30/2014    No results found.  Assessment & Plan:   Ijah was seen today for follow-up.  Diagnoses and all orders for this visit:  Encounter for medication management -     ciprofloxacin (CIPRO) 500 MG/5ML (10%) suspension; Take 5 mLs (500 mg total) by mouth 2 (two) times daily. -     metroNIDAZOLE (FLAGYL) 50 mg/ml oral suspension; Take 10 mLs (500 mg total) by mouth 3 (three) times daily.  Diverticulosis of colon -     ciprofloxacin (CIPRO) 500 MG/5ML (10%) suspension; Take 5 mLs (500 mg total) by mouth 2 (two) times daily. -     metroNIDAZOLE (FLAGYL) 50 mg/ml oral suspension; Take 10  mLs (500 mg total) by mouth 3 (three) times daily.  Generalized abdominal pain -     ciprofloxacin (CIPRO) 500 MG/5ML (10%) suspension; Take 5 mLs (500 mg total) by mouth 2 (two) times daily. -     metroNIDAZOLE (FLAGYL) 50 mg/ml oral suspension; Take 10 mLs (500 mg total) by mouth 3 (three) times daily.   I have discontinued Ms. Castronovo's citalopram, ciprofloxacin, and metroNIDAZOLE. I am also having her start on ciprofloxacin and metroNIDAZOLE. Additionally, I am having her maintain her Vitamin D3, multivitamins ther. w/minerals, carvedilol, ALPRAZolam, furosemide, OVER THE COUNTER MEDICATION, pantoprazole, feeding supplement (ENSURE ENLIVE), acetaminophen, diclofenac sodium, and levocetirizine.  Meds ordered this encounter  Medications  . levocetirizine (XYZAL) 5 MG tablet    Sig: Take 5 mg by mouth every evening.  . ciprofloxacin (CIPRO) 500 MG/5ML (10%) suspension    Sig: Take 5 mLs (500 mg total) by mouth 2 (two) times daily.    Dispense:  80 mL    Refill:  0    Order Specific Question:   Supervising Provider    Answer:   Cassandria Anger [[id number]]  . metroNIDAZOLE (FLAGYL) 50 mg/ml oral suspension    Sig: Take 10 mLs (500 mg total) by mouth 3 (three) times daily.    Dispense:  300 mL    Refill:  0    Order Specific Question:   Supervising Provider    Answer:   Cassandria Anger [[id number]]    Follow-up: Return if symptoms worsen or fail to improve.  Wilfred Lacy, NP

## 2016-10-30 NOTE — Progress Notes (Signed)
Pre visit review using our clinic review tool, if applicable. No additional management support is needed unless otherwise documented below in the visit note. 

## 2016-10-31 ENCOUNTER — Other Ambulatory Visit: Payer: Self-pay

## 2016-10-31 MED ORDER — CARVEDILOL 3.125 MG PO TABS: 3.1250 mg | ORAL_TABLET | Freq: Two times a day (BID) | ORAL | 2 refills | Status: DC

## 2016-10-31 NOTE — Progress Notes (Unsigned)
Ok to refill carvedilor 3.125 per Baldo Ash, NP

## 2016-11-11 ENCOUNTER — Encounter (HOSPITAL_COMMUNITY): Payer: Self-pay | Admitting: Emergency Medicine

## 2016-11-11 DIAGNOSIS — R1032 Left lower quadrant pain: Secondary | ICD-10-CM | POA: Insufficient documentation

## 2016-11-11 DIAGNOSIS — Z853 Personal history of malignant neoplasm of breast: Secondary | ICD-10-CM | POA: Insufficient documentation

## 2016-11-11 DIAGNOSIS — I10 Essential (primary) hypertension: Secondary | ICD-10-CM | POA: Diagnosis not present

## 2016-11-11 DIAGNOSIS — R1030 Lower abdominal pain, unspecified: Secondary | ICD-10-CM | POA: Diagnosis not present

## 2016-11-11 DIAGNOSIS — R109 Unspecified abdominal pain: Secondary | ICD-10-CM | POA: Diagnosis not present

## 2016-11-11 LAB — COMPREHENSIVE METABOLIC PANEL
ALT: 19 U/L (ref 14–54)
AST: 33 U/L (ref 15–41)
Albumin: 4.2 g/dL (ref 3.5–5.0)
Alkaline Phosphatase: 40 U/L (ref 38–126)
Anion gap: 8 (ref 5–15)
BUN: 26 mg/dL — ABNORMAL HIGH (ref 6–20)
CHLORIDE: 104 mmol/L (ref 101–111)
CO2: 29 mmol/L (ref 22–32)
Calcium: 9.2 mg/dL (ref 8.9–10.3)
Creatinine, Ser: 1.02 mg/dL — ABNORMAL HIGH (ref 0.44–1.00)
GFR, EST AFRICAN AMERICAN: 52 mL/min — AB (ref >60)
GFR, EST NON AFRICAN AMERICAN: 45 mL/min — AB (ref >60)
Glucose, Bld: 97 mg/dL (ref 65–99)
POTASSIUM: 4.1 mmol/L (ref 3.5–5.1)
Sodium: 141 mmol/L (ref 135–145)
Total Bilirubin: 0.5 mg/dL (ref 0.3–1.2)
Total Protein: 6.9 g/dL (ref 6.5–8.1)

## 2016-11-11 LAB — CBC
HEMATOCRIT: 38.2 % (ref 36.0–46.0)
HEMOGLOBIN: 12.6 g/dL (ref 12.0–15.0)
MCH: 31.3 pg (ref 26.0–34.0)
MCHC: 33 g/dL (ref 30.0–36.0)
MCV: 94.8 fL (ref 78.0–100.0)
Platelets: 213 10*3/uL (ref 150–400)
RBC: 4.03 MIL/uL (ref 3.87–5.11)
RDW: 14.2 % (ref 11.5–15.5)
WBC: 5.2 10*3/uL (ref 4.0–10.5)

## 2016-11-11 LAB — LIPASE, BLOOD: Lipase: 30 U/L (ref 11–51)

## 2016-11-11 NOTE — ED Triage Notes (Addendum)
Pt c/o right arm pain from hand to shoulder onset 11/02/16 after having venipuncture by phlebotomist. Pain reproducible with palpation, worse with ROM. Pt reports equal bilateral leg weakness onset the same time. Dark yellow urine, urinary frequency, dysuria, low medial abdominal pain radiating to right flank.

## 2016-11-12 ENCOUNTER — Emergency Department (HOSPITAL_COMMUNITY): Payer: Medicare Other

## 2016-11-12 ENCOUNTER — Emergency Department (HOSPITAL_COMMUNITY)
Admission: EM | Admit: 2016-11-12 | Discharge: 2016-11-12 | Disposition: A | Discharge status: Home / Self Care | Payer: Medicare Other | Attending: Emergency Medicine | Admitting: Emergency Medicine

## 2016-11-12 DIAGNOSIS — R109 Unspecified abdominal pain: Secondary | ICD-10-CM | POA: Diagnosis not present

## 2016-11-12 DIAGNOSIS — R1032 Left lower quadrant pain: Secondary | ICD-10-CM

## 2016-11-12 LAB — URINALYSIS, ROUTINE W REFLEX MICROSCOPIC
BILIRUBIN URINE: NEGATIVE
Glucose, UA: NEGATIVE mg/dL
Ketones, ur: NEGATIVE mg/dL
Nitrite: NEGATIVE
PROTEIN: NEGATIVE mg/dL
SPECIFIC GRAVITY, URINE: 1.014 (ref 1.005–1.030)
pH: 6 (ref 5.0–8.0)

## 2016-11-12 MED ORDER — TRAMADOL HCL 50 MG PO TABS: 50.0000 mg | ORAL_TABLET | Freq: Four times a day (QID) | ORAL | 0 refills | Status: DC | PRN

## 2016-11-12 NOTE — ED Provider Notes (Signed)
[facility] DEPT Provider Note   CSN: [id number] Arrival date & time: 11/11/16  1750 By signing my name below, I, [name], attest that this documentation has been prepared under the direction and in the presence of [name], MD . Electronically Signed: [name], Scribe. 11/12/2016. 2:34 AM.   History   Chief Complaint Chief Complaint  Patient presents with  . Arm Pain  . Weakness   HPI Heather [name] is a 82 y.o. female with a hx of anxiety, who presents to the Emergency Department complaining of intermittent, worsening LLQ pain which occurs while walking onset a few months ago. Per pt, she was dx with diverticulitis and given medication that she was unable to take because the pills were too large. She has a prescription for a liquid medication, but has not picked this up yet. Per pt, pain is sometimes brought on by urination. She also complains of persistent right arm pain s/p having venipuncture on 11/02/16. No alleviating or modifying factors noted. No treatments tried PTA. Pt denies any fever.   The history is provided by the patient. No language interpreter was used.   Past Medical History:  Diagnosis Date  . Allergic rhinitis, cause unspecified 02/21/2014  . Anxiety state, unspecified   . Breast cancer ([place])    NO STICK OR BLOOD PRESSURE CHECKS IN LEFT ARM  . Carpal tunnel syndrome   . Cervical spondylosis   . Colon polyp 12/19/2009   Transverse-polypoid colorectal mucosa  . Constipation   . Degenerative joint disease   . Diastolic dysfunction   . Diverticula of colon   . Gastritis, chronic   . Hiatal hernia   . Hypertension   . Low back pain   . RBBB (right bundle branch block with left anterior fascicular block)   . Renal cyst   . Venous insufficiency     Patient Active Problem List   Diagnosis Date Noted  . Anxiety about health 05/28/2016  . Abnormal CT scan of lung 07/16/2015  . Abdominal pain 11/30/2014  . Chronic LLQ pain 11/23/2014  .  Esophageal dysmotility 10/27/2014  . Cough 10/21/2014  . Other dysphagia 05/09/2014  . Failure to thrive in adult 04/26/2014  . Arthritis of hand, degenerative 04/10/2014  . Other and unspecified hyperlipidemia 04/10/2014  . LPRD (laryngopharyngeal reflux disease) 04/04/2014  . Routine general medical examination at a health care facility 02/21/2014  . Allergic rhinitis 02/21/2014  . Abnormal findings in stool 12/02/2013  . Abdominal pain, left lower quadrant 06/22/2013  . Chronic bronchitis ([place]) 05/30/2013  . Somatization disorder 03/14/2013  . Hx of breast cancer 08/30/2012  . Multiple somatic complaints 06/28/2012  . Rhinitis 05/11/2012  . Edema 06/12/2011  . GERD 03/27/2010  . DIZZINESS 08/31/2009  . Diastolic CHF ([place]) [id number]  . FLANK PAIN, LEFT 03/21/2009  . WEAKNESS 02/21/2009  . CONSTIPATION 01/18/2009  . FLATULENCE-GAS-BLOATING 01/18/2009  . CARPAL TUNNEL SYNDROME, BILATERAL 10/03/2008  . Cervical spondylosis without myelopathy 03/21/2008  . Diaphragmatic hernia 12/26/2007  . RENAL CYST 12/26/2007  . Anxiety state 09/15/2007  . Essential hypertension 09/15/2007  . VENOUS INSUFFICIENCY 09/15/2007  . Osteoarthritis 09/15/2007  . LOW BACK PAIN, CHRONIC 09/15/2007  . Diverticulosis of colon 05/24/2002    Past Surgical History:  Procedure Laterality Date  . CARPAL TUNNEL RELEASE    . Left mastectomy      OB History    No data available       Home Medications    Prior to Admission medications  Medication Sig Start Date End Date Taking? Authorizing Provider  acetaminophen (TYLENOL) 500 MG tablet Take 2 tablets (1,000 mg total) by mouth every 6 (six) hours as needed. Patient taking differently: Take 1,000 mg by mouth every 6 (six) hours as needed for mild pain.  09/30/16   Flossie Buffy, NP  ALPRAZolam Duanne Moron) 0.5 MG tablet Take 0.25-0.5 mg by mouth 3 (three) times daily as needed for anxiety or sleep.     Historical Provider, MD  carvedilol (COREG)  3.125 MG tablet Take 1 tablet (3.125 mg total) by mouth 2 (two) times daily. 10/31/16   Flossie Buffy, NP  Cholecalciferol (VITAMIN D3) 1000 UNITS CAPS Take 1,000 Units by mouth daily. For your bones    Historical Provider, MD  ciprofloxacin (CIPRO) 500 MG/5ML (10%) suspension Take 5 mLs (500 mg total) by mouth 2 (two) times daily. 10/30/16   Flossie Buffy, NP  diclofenac sodium (VOLTAREN) 1 % GEL Apply 2 g topically 2 (two) times daily. 10/13/16   Recardo Evangelist, PA-C  feeding supplement, ENSURE ENLIVE, (ENSURE ENLIVE) LIQD Take 237 mLs by mouth 2 (two) times daily between meals. 09/30/16   Flossie Buffy, NP  furosemide (LASIX) 40 MG tablet Take 40 mg by mouth daily.     Historical Provider, MD  levocetirizine (XYZAL) 5 MG tablet Take 5 mg by mouth every evening.    Historical Provider, MD  metroNIDAZOLE (FLAGYL) 50 mg/ml oral suspension Take 10 mLs (500 mg total) by mouth 3 (three) times daily. 10/30/16   Flossie Buffy, NP  Multiple Vitamins-Minerals (MULTIVITAMINS THER. W/MINERALS) TABS Take 1 tablet by mouth daily.    Historical Provider, MD  OVER THE COUNTER MEDICATION Take 8 oz by mouth 2 (two) times daily as needed (for constipation). Patient states she drink about 2 glasses of prune juice a day for constipation    Historical Provider, MD  pantoprazole (PROTONIX) 40 MG tablet Take 1 tablet (40 mg total) by mouth 2 (two) times daily. 09/04/16   Flossie Buffy, NP    Family History Family History  Problem Relation Age of Onset  . Colon cancer Neg Hx     Social History Social History  Substance Use Topics  . Smoking status: Never Smoker  . Smokeless tobacco: Never Used  . Alcohol use No     Allergies   Fentanyl   Review of Systems Review of Systems 10 systems reviewed and all are negative for acute change except as noted in the HPI.  Physical Exam Updated Vital Signs BP 133/73 (BP Location: Right Arm)   Pulse 80   Temp 97.7 F (36.5 C) (Oral)    Resp 15   SpO2 100%   Physical Exam  Constitutional: She is oriented to person, place, and time. She appears well-developed and well-nourished. No distress.  HENT:  Head: Normocephalic and atraumatic.  Eyes: EOM are normal.  Neck: Normal range of motion.  Cardiovascular: Normal rate, regular rhythm and normal heart sounds.   Pulmonary/Chest: Effort normal and breath sounds normal.  Abdominal: Soft. She exhibits no distension. There is no tenderness.  Musculoskeletal: Normal range of motion.  Neurological: She is alert and oriented to person, place, and time.  Skin: Skin is warm and dry.  Psychiatric: She has a normal mood and affect. Judgment normal.  Nursing note and vitals reviewed.   ED Treatments / Results  DIAGNOSTIC STUDIES:  Oxygen Saturation is 100% on RA, normal by my interpretation.    COORDINATION OF CARE:  2:33 AM Discussed treatment plan with pt at bedside and pt agreed to plan.   Labs (all labs ordered are listed, but only abnormal results are displayed) Labs Reviewed  COMPREHENSIVE METABOLIC PANEL - Abnormal; Notable for the following:       Result Value   BUN 26 (*)    Creatinine, Ser 1.02 (*)    GFR calc non Af Amer 45 (*)    GFR calc Af Amer 52 (*)    All other components within normal limits  LIPASE, BLOOD  CBC  URINALYSIS, ROUTINE W REFLEX MICROSCOPIC    EKG  EKG Interpretation None       Radiology No results found.  Procedures Procedures (including critical care time)  Medications Ordered in ED Medications - No data to display   Initial Impression / Assessment and Plan / ED Course  I have reviewed the triage vital signs and the nursing notes.  Pertinent labs & imaging results that were available during my care of the patient were reviewed by me and considered in my medical decision making (see chart for details).  Clinical Course     Patient presents with multiple complaints including pain in her left lower abdomen/groin. Her  history is somewhat inconsistent and confusing, however it appears as though this pain has been present for several months. CT scan today reveals nothing emergent and I see no evidence for any significant laboratory abnormality, UTI, or acute process. I will advise her to follow-up with her primary Dr.   She appears very anxious about her medical issues and I attempted to reassure her that nothing today appears emergent.  Final Clinical Impressions(s) / ED Diagnoses   Final diagnoses:  None    New Prescriptions New Prescriptions   No medications on file  I personally performed the services described in this documentation, which was scribed in my presence. The recorded information has been reviewed and is accurate.        [name], MD 11/13/16 [phone]

## 2016-11-12 NOTE — Discharge Instructions (Signed)
Tramadol as prescribed as needed for pain.  Follow-up with your primary Dr. in the next week if not improving.

## 2016-11-12 NOTE — ED Notes (Signed)
Discharge instructions reviewed with patient by previous RN.

## 2016-11-20 ENCOUNTER — Telehealth: Payer: Self-pay | Admitting: *Deleted

## 2016-11-20 MED ORDER — FUROSEMIDE 40 MG PO TABS
40.0000 mg | ORAL_TABLET | Freq: Every day | ORAL | 3 refills | Status: DC
Start: 2016-11-20 — End: 2017-01-19

## 2016-11-20 NOTE — Telephone Encounter (Signed)
Rec'd call she requesting refill on her Lasix.She states the bottle has Dr. Volney American name on it and she no longer see him.  Verified pharmacy inform will send to [place]...Johny Chess

## 2016-11-25 ENCOUNTER — Telehealth: Payer: Self-pay | Admitting: Nurse Practitioner

## 2016-11-26 ENCOUNTER — Encounter: Payer: Self-pay | Admitting: Nurse Practitioner

## 2016-11-26 ENCOUNTER — Other Ambulatory Visit: Payer: Medicare Other

## 2016-11-26 ENCOUNTER — Ambulatory Visit (INDEPENDENT_AMBULATORY_CARE_PROVIDER_SITE_OTHER): Payer: Medicare Other | Admitting: Nurse Practitioner

## 2016-11-26 VITALS — BP 138/68 | HR 74 | Temp 98.6°F | Ht 61.5 in | Wt 121.0 lb

## 2016-11-26 DIAGNOSIS — R1032 Left lower quadrant pain: Secondary | ICD-10-CM

## 2016-11-26 DIAGNOSIS — R3589 Other polyuria: Secondary | ICD-10-CM

## 2016-11-26 DIAGNOSIS — G8929 Other chronic pain: Secondary | ICD-10-CM | POA: Diagnosis not present

## 2016-11-26 DIAGNOSIS — K5900 Constipation, unspecified: Secondary | ICD-10-CM

## 2016-11-26 DIAGNOSIS — R358 Other polyuria: Secondary | ICD-10-CM | POA: Diagnosis not present

## 2016-11-26 DIAGNOSIS — M545 Low back pain: Secondary | ICD-10-CM | POA: Diagnosis not present

## 2016-11-26 LAB — POC URINALSYSI DIPSTICK (AUTOMATED)
Bilirubin, UA: NEGATIVE
Blood, UA: POSITIVE
GLUCOSE UA: NEGATIVE
Ketones, UA: NEGATIVE
LEUKOCYTES UA: NEGATIVE
NITRITE UA: NEGATIVE
PROTEIN UA: NEGATIVE
Spec Grav, UA: 1.025
UROBILINOGEN UA: 0.2
pH, UA: 5.5

## 2016-11-26 MED ORDER — POLYETHYLENE GLYCOL 3350 17 GM/SCOOP PO POWD: 17.0000 g | Freq: Every day | ORAL | 1 refills | Status: DC | PRN

## 2016-11-26 MED ORDER — ACETAMINOPHEN-CODEINE #2 300-15 MG PO TABS: 1.0000 | ORAL_TABLET | Freq: Four times a day (QID) | ORAL | 0 refills | Status: DC | PRN

## 2016-11-26 NOTE — Progress Notes (Signed)
Reviewed with patient in office. See office note. Urine culture sent

## 2016-11-26 NOTE — Progress Notes (Signed)
Subjective:  Patient ID: Heather [name], female    DOB: 1918/08/05  Age: 83 y.o. MRN: [id number]  CC: Groin Pain (Left sided groin pain. ED visits for the same. )   Groin Pain  The patient's primary symptoms include pelvic pain. The patient's pertinent negatives include no genital itching, genital lesions, genital odor or vaginal discharge. This is a chronic problem. The problem occurs intermittently. The problem has been waxing and waning. The problem affects the left side. She is not pregnant. Associated symptoms include abdominal pain, back pain, flank pain and frequency. Pertinent negatives include no anorexia, chills, constipation, diarrhea, discolored urine, dysuria, fever, hematuria, joint swelling, rash or urgency.  no improvement with use of tramadol and tylenol.   reviewed CT ABD/pelvis done at hospital: no acute finding.  Outpatient Medications Prior to Visit  Medication Sig Dispense Refill  . acetaminophen (TYLENOL) 500 MG tablet Take 2 tablets (1,000 mg total) by mouth every 6 (six) hours as needed. (Patient taking differently: Take 1,000 mg by mouth every 6 (six) hours as needed for mild pain. ) 30 tablet 1  . ALPRAZolam (XANAX) 0.5 MG tablet Take 0.25-0.5 mg by mouth 3 (three) times daily as needed for anxiety or sleep.     . carvedilol (COREG) 3.125 MG tablet Take 1 tablet (3.125 mg total) by mouth 2 (two) times daily. 180 tablet 2  . Cholecalciferol (VITAMIN D3) 1000 UNITS CAPS Take 1,000 Units by mouth daily. For your bones    . diclofenac sodium (VOLTAREN) 1 % GEL Apply 2 g topically 2 (two) times daily. 100 g 0  . feeding supplement, ENSURE ENLIVE, (ENSURE ENLIVE) LIQD Take 237 mLs by mouth 2 (two) times daily between meals. 237 mL 12  . furosemide (LASIX) 40 MG tablet Take 1 tablet (40 mg total) by mouth daily. 30 tablet 3  . levocetirizine (XYZAL) 5 MG tablet Take 5 mg by mouth every evening.    . Multiple Vitamins-Minerals (MULTIVITAMINS THER. W/MINERALS) TABS Take 1  tablet by mouth daily.    . pantoprazole (PROTONIX) 40 MG tablet Take 1 tablet (40 mg total) by mouth 2 (two) times daily. 60 tablet 11  . ciprofloxacin (CIPRO) 500 MG/5ML (10%) suspension Take 5 mLs (500 mg total) by mouth 2 (two) times daily. 80 mL 0  . metroNIDAZOLE (FLAGYL) 50 mg/ml oral suspension Take 10 mLs (500 mg total) by mouth 3 (three) times daily. 300 mL 0  . OVER THE COUNTER MEDICATION Take 8 oz by mouth 2 (two) times daily as needed (for constipation). Patient states she drink about 2 glasses of prune juice a day for constipation    . traMADol (ULTRAM) 50 MG tablet Take 1 tablet (50 mg total) by mouth every 6 (six) hours as needed. 15 tablet 0   No facility-administered medications prior to visit.     ROS See HPI  Objective:  BP 138/68 (BP Location: Left Arm, Patient Position: Sitting, Cuff Size: Normal)   Pulse 74   Temp 98.6 F (37 C) (Oral)   Ht 5' 1.5" (1.562 m)   Wt 121 lb (54.9 kg)   SpO2 96%   BMI 22.49 kg/m   BP Readings from Last 3 Encounters:  11/26/16 138/68  11/12/16 140/70  10/30/16 124/64    Wt Readings from Last 3 Encounters:  11/26/16 121 lb (54.9 kg)  10/30/16 125 lb (56.7 kg)  10/17/16 120 lb (54.4 kg)    Physical Exam  Constitutional: She is oriented to person, place, and time.  No distress.  Neck: Normal range of motion. Neck supple.  Cardiovascular: Normal rate and normal heart sounds.   Pulmonary/Chest: Effort normal and breath sounds normal.  Abdominal: Soft. Bowel sounds are normal. She exhibits no distension and no mass. There is tenderness. There is no rebound and no guarding.  Musculoskeletal: She exhibits edema.  Neurological: She is alert and oriented to person, place, and time.  Skin: Skin is warm and dry. No rash noted. No erythema.  Vitals reviewed.   Lab Results  Component Value Date   WBC 5.2 11/11/2016   HGB 12.6 11/11/2016   HCT 38.2 11/11/2016   PLT 213 11/11/2016   GLUCOSE 97 11/11/2016   CHOL 199 02/02/2014    TRIG 20.0 02/02/2014   HDL 98.70 02/02/2014   LDLCALC 96 02/02/2014   ALT 19 11/11/2016   AST 33 11/11/2016   NA 141 11/11/2016   K 4.1 11/11/2016   CL 104 11/11/2016   CREATININE 1.02 (H) 11/11/2016   BUN 26 (H) 11/11/2016   CO2 29 11/11/2016   TSH 2.67 11/30/2014    Ct Abdomen Pelvis Wo Contrast  Result Date: 11/12/2016 CLINICAL DATA:  Left flank and groin pain EXAM: CT ABDOMEN AND PELVIS WITHOUT CONTRAST TECHNIQUE: Multidetector CT imaging of the abdomen and pelvis was performed following the standard protocol without IV contrast. COMPARISON:  CT abdomen pelvis 05/02/2016 FINDINGS: The lack of intravenous and oral contrast somewhat limits the study, as the bowel is closely space throughout the abdomen and there is poor contrast resolution between the bowel and the retroperitoneal structures. Lower chest: No pulmonary nodules or pleural effusion. No visible pericardial effusion. Hepatobiliary: Normal noncontrast appearance of the liver. No visible biliary dilatation. Normal gallbladder. Pancreas: Normal noncontrast appearance of the pancreas. No peripancreatic fluid collection. Spleen: Normal. Adrenal glands: Normal. Urinary Tract: --Right kidney: No hydronephrosis or perinephric stranding. No nephrolithiasis. No obstructing ureteral stones. --Left kidney: No hydronephrosis or perinephric stranding. No nephrolithiasis. No obstructing ureteral stones. --Urinary bladder: Unremarkable. Stomach/Bowel: No dilated loops of bowel. No evidence of colonic or enteric inflammation. No fluid collection within the abdomen. Moderate amount of stool within the colon. Vascular/Lymphatic: There is atherosclerotic calcification of the non aneurysmal abdominal aorta. No visible abdominal or pelvic lymphadenopathy. Reproductive: No focal abnormality identified. Musculoskeletal. Multilevel lumbar osteophytosis and facet arthrosis. No bony spinal canal stenosis. IMPRESSION: 1. No obstructive uropathy. 2. Assessment of  the bowel is limited by the lack of p.o. and intravenous contrast, causing poor contrast resolution between the bowel and adjacent retroperitoneal structures. Within that limitation, there is a moderate amount of stool within the colon but no acute abnormality of the bowel. 3. Aortic atherosclerosis. Electronically Signed   By: Ulyses Jarred M.D.   On: 11/12/2016 04:19    Assessment & Plan:   Brittani was seen today for groin pain.  Diagnoses and all orders for this visit:  Chronic bilateral low back pain without sciatica -     acetaminophen-codeine (TYLENOL #2) 300-15 MG tablet; Take 1-2 tablets by mouth every 6 (six) hours as needed for moderate pain.  Chronic LLQ pain -     acetaminophen-codeine (TYLENOL #2) 300-15 MG tablet; Take 1-2 tablets by mouth every 6 (six) hours as needed for moderate pain. -     Urine culture; Future  Constipation, unspecified constipation type -     polyethylene glycol powder (GLYCOLAX/MIRALAX) powder; Take 17 g by mouth daily as needed.  Polyuria -     POCT Urinalysis Dipstick (Automated) -  Urine culture; Future   I have discontinued Ms. Earll's OVER THE COUNTER MEDICATION, ciprofloxacin, metroNIDAZOLE, and traMADol. I am also having her start on acetaminophen-codeine and polyethylene glycol powder. Additionally, I am having her maintain her Vitamin D3, multivitamins ther. w/minerals, ALPRAZolam, pantoprazole, feeding supplement (ENSURE ENLIVE), acetaminophen, diclofenac sodium, levocetirizine, carvedilol, and furosemide.  Meds ordered this encounter  Medications  . acetaminophen-codeine (TYLENOL #2) 300-15 MG tablet    Sig: Take 1-2 tablets by mouth every 6 (six) hours as needed for moderate pain.    Dispense:  30 tablet    Refill:  0    Order Specific Question:   Supervising Provider    Answer:   Cassandria Anger [[id number]]  . polyethylene glycol powder (GLYCOLAX/MIRALAX) powder    Sig: Take 17 g by mouth daily as needed.    Dispense:  3350 g     Refill:  1    Order Specific Question:   Supervising Provider    Answer:   Cassandria Anger [[id number]]    Follow-up: Return if symptoms worsen or fail to improve.  Wilfred Lacy, NP

## 2016-11-26 NOTE — Patient Instructions (Signed)
Use pain medication as prescribed.  Use miralax or colace as needed for constipation

## 2016-11-26 NOTE — Progress Notes (Signed)
Pre visit review using our clinic review tool, if applicable. No additional management support is needed unless otherwise documented below in the visit note. 

## 2016-11-27 LAB — URINE CULTURE: Organism ID, Bacteria: NO GROWTH

## 2016-12-01 ENCOUNTER — Encounter: Payer: Self-pay | Admitting: Pulmonary Disease

## 2016-12-01 ENCOUNTER — Ambulatory Visit (INDEPENDENT_AMBULATORY_CARE_PROVIDER_SITE_OTHER): Payer: Medicare Other | Admitting: Pulmonary Disease

## 2016-12-01 VITALS — BP 110/60 | HR 80 | Temp 98.9°F | Ht 62.0 in | Wt 119.0 lb

## 2016-12-01 DIAGNOSIS — R6889 Other general symptoms and signs: Secondary | ICD-10-CM

## 2016-12-01 DIAGNOSIS — K449 Diaphragmatic hernia without obstruction or gangrene: Secondary | ICD-10-CM | POA: Diagnosis not present

## 2016-12-01 DIAGNOSIS — R918 Other nonspecific abnormal finding of lung field: Secondary | ICD-10-CM

## 2016-12-01 DIAGNOSIS — K219 Gastro-esophageal reflux disease without esophagitis: Secondary | ICD-10-CM

## 2016-12-01 DIAGNOSIS — K224 Dyskinesia of esophagus: Secondary | ICD-10-CM | POA: Diagnosis not present

## 2016-12-01 DIAGNOSIS — F418 Other specified anxiety disorders: Secondary | ICD-10-CM

## 2016-12-01 DIAGNOSIS — J410 Simple chronic bronchitis: Secondary | ICD-10-CM | POA: Diagnosis not present

## 2016-12-01 NOTE — Progress Notes (Signed)
Subjective:    Patient ID: Heather [name], female    DOB: April 22, 1918, 83 y.o.   MRN: [id number]  HPI 83 y/o BF here for a follow up visit... she has multiple medical problems including:  Dyspnea & Anxiety;  HBP;  Hx CP followed by Heather [name];  HH/ Gastritis;  Divertics/ Constip;  Functional Abd Pain;  Renal cyst followed by Heather [name];  Hx left breast [phone];  DJD/ CSpine spondy/ LBP/ CTS; and she has a cancer phobia...   ~  SEE PREV EPIC NOTES FOR OLDER DATA >> She has established Primary Care follow up w/ Heather [name] => DrHusain, [name]...   CXR 10/14 showed normal heart size w/ tortuous atherosclerotic Ao, clear lungs w/ ?vague opac left mid zone adjacent to hilum (nothing seen in this area on CT 3/14) ?overlying shadows & we will f/u...  Ambulatory O2 check 10/14> O2 sat= 98% on room air at rest w/ pulse=83, regular; Ambulated 3 laps w/ lowest O2 sat= 96% w/ pulse=108...  LABS 10/14:  Chems- wnl;  CBC- wnl;  TSH=2.04;  VitD=71;  Sed=11...  ~  Heather [name] has established w/ Heather [name] for Primary care and has seen him x3 already... She still sees DrCrosslry for ENT and DrStark for GI>>  Baseline CXRs showed sl overinflation, clear lungs, normal heart size w/ elongation & tortuous Ao...  Prev CT Chest/ CTA 3/14 showed no PE, no acute findings, +atherosclerotic changes, several scattered tiny nodules 3-32mm size & no change from 2009 scan, s/p left mastectomy, DJD Tspine...   PFTs 9/13 were WNL> FVC=2.62 (128%), FEV1=2.20 (158%), %1sec=84; mid-flows=274% of predicted  CXR 04/04/14 shows stable heart size, atherosclerotic & tortuous Ao, mild hyperinflation, clear lungs w/o acute changes, DJD Tspine...   PFT 07/03/14> despite mult attempts w/ several test administrators/ staff- she was unable to perform the procedure due to inability to cooperate...  Ambulatory O2sat Test 07/03/14 on RA>  Baseline O2sat= 99% w/ pulse=86;  After 3 laps her nadir O2sat= 96% w/ max pulse= 114...  ~   September 21, 2014:  26mo ROV & pt added-on today at her request for mult symptoms> but on questioning it is apparent that her symptomatology is chronic w/o acute features; she notes cough ("hocking cough"), thick white sputum ("with white balls"), chest congestion; but she denies to me any f/c/s, CP, hemoptysis, etc and she notes that "my breathing is good";;;  Then she launches into a litany of complaints- poor appetite, wt loss ("I'm nothing but a frame"), back pain ("the drawing kind"), and general flight of ideas;  Meds reviewed & include prev ZPak & Mucinex (?taking 1 Qid);  Exam is neg w/ clear chest- no wheezing, rales, or rhonchi hear;  Last CXR was 5/15 showing essentially clear lungs, NAD...    She saw Heather [name] for Cards 9/15> Hx HBP, CHF/ DD, AtypCP, RBBB, neg Myoview 2009; on Coreg3.125Bid, Amlod2.5, Lasix40 as needed for swelling; BP= 112/60 today...     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     Hx mult Ortho complaints and LBP evaluated by DrWhitfield w/ Epid shots per DrNewton but she claims no improvement; notes Tramadol doesn't help and want Vicodin refilled, she wears a back brace...     Very anxious and has Celexa10 & Alprazolam 0.5mg  on her med list...   EKG 9/15 showed NSR, rate81, LAD, RBBB, NSSTTWA, NAD...  PLAN>>  Heather [name] is reassured- her breathing is stable w/o acute lung problems at this time; she is asked to continue the Mucinex600mg  Qid w/ fluids, and remain as active as possible; reminded to use her ProtonixBid & follow antireflux regimen to help her LPR;  She will f/u w/ her other physicians and see me in 6 months for re-evaluation & f/u CXR...  ~  Mar 22, 2015:  67mo ROV & Heather [name] says "I'm just skin & bones" weight is noted to be down 15# to 120# today (BMI=22);  She says that her dentist noted her teeth are  wearing out & this may acct for some wt loss- needs to incr nutritional supplement like BOOST Bid;  She persists w/ mult medical complaints- "this swallowing business" and she is seeing both [name] and [name] for help w/ reflux and cancer phobia "it can show up anywhere";  From the resp standpoint she notes same "hocking" throat clearing cough w/ thick, white, foamy sput; she notes SOB/DOE w/o change and she is still not taking the Alpraz or the Mucinex as requested...      She saw Heather [name] 1/16> note reviewed & felt to be stable... She tells me she has switched Primary Care to [facility]...      She saw [name] 1/16 and they felt no further GI w/u warranted, use abd binder prn...      She saw DrEdwards, GI 2/16> bloating, constip, wt loss> note reviewed EXAM reveals Afeb, VSS, O2sat=99% on RA;  HEENT- neg x sl dry MMs;  Chest- she has a forced dry hacking cough that is nonproductive, clear w/o w/r/r;  Heart- RR w/o m/r/g heard;  Abd- soft, nontender, neg...  We reviewed prob list, meds, xrays and labs> see below for updates >>   LABS 11/2014 were WNL.Marland KitchenMarland Kitchen  CXR 03/22/15 showed stable cardiomeg, chronic changes, old right rib fxs, NAD...  PLAN>>  Please Heather [name] take the Mucinex600mg - one tab Qid w/ fluids, and try the Alprazolam 0.5mg  1/2 tab Tid to start; I have tried to reassure her as best I can, she needs to eat better & take the nutritional supplement Bid...  (NOTE> I spent 9min face-to-face w/ pt discussing her symptoms, examining pt, reviewing recommendations, and answering questions)  ~  July 16, 2015:  66mo ROV & Heather [name] presents w/ her usual litany of somatic complaints- "terrible" she says; c/o feeling hot, eyes run water, had dental work done, still w/ mult GI issues, unhappy w/ PCP, etc... She saw DrEdwards for GI eval & his note is reviewed; she went to the ER 05/17/15 w/ GI complaints and the ER physician ordered a CT Abd & Pelvis> a few tiny 3-52mm nodules were noted in the lateral aspect of the  RML, the abd revealed aortoiliac calcif w/o aneurysm, right renal cyst, otherw no signif abn noted (Radiology rec f/u CT Chest in 35yr); she is very concerned and I indicated that I thought this was prob some scar tissue but we would recheck her CXR in 59mo & follow up the CT in 31yr...     AB> she c/o cough "hoking little white balls"; baseline CXRs have been clear (old right rib fxs), PFT in 2013 was wnl, oxygenation is wnl, and exam is clear; she has been asked to use Mucinex Qid but she won't do it...    Abn CT scan w/ scat pulm nodules> prev CT Chest w/ tiny left apical oapc, unchanged serially & felt to be benign; CT Abd done 05/17/15 w/  tiny 3-49mm RML nodules detected, otherw eg & we discussed f/u CXR in 21mo & CT Chest in 66yr.     HBP> on Coreg3.125Bid, Lasix40, off Amlod; BP= 106/60 & she persists w/ mult somatic complaints; last 2DEcho 10/2009 showed norm LV size & func w/ EF=55-60%, mildAI....    HxCP> on ASA81; followed by Heather [name]/ DrNishan for Cards & their prev notes are reviewed=> neg ischemic work up previously...     VI, Edema> she went to the ER 11/13 w/ edema; VDopplers were neg for DVT; they reiterated the recs for no salt, elevation, support hose, & continue Lasix40.     GI- HH, Gastirits, Divertics, chronic pain & "swelling"> on Protonix40Bid plus Zantac, Miralax & Senakot-S, etc; she persists w/ mult GI complaints despite prev evals by [facility], [facility], [facility]...    Renal cyst> she is very anxious about everything & has a cancer phobia; Heather [name] followed this benign simple cyst for yrs- no change...     Hx Breast cancer> she had left mod radical mastectomy 1994 by DrBlievernicht- no known recurrence & she is given reassurance...     Ortho- DJD, Cspine spondy, LBP, CTS> followed by DrWhitfield & he injected her right knee & her back; on Vicodin prn...    Anxiety> on Alpraz0.5mg Tid but "I'm not taking it very often" she says & encouraged to use more regularly...  We reviewed  prob list, meds, xrays and labs> see below for updates >> her PCP is Artist Beach, Animal nutritionist at [facility]...  Ba swallow 03/23/15 showed espohageal dysmotility w/ full column stasis and tertiart contractions, no stricture detected...  CT Abd & Pelvis 05/17/15 showed a few tiny 3-70mm nodules were noted in the lateral aspect of the RML, the abd revealed aortoiliac calcif w/o aneurysm, right renal cyst, otherw no signif abn noted (Radiology rec f/u CT Chest in 55yr).  LABS 05/17/15 via ER> Chems- wnl;  CBC- wnl... IMP/PLAN>>  I told Izzy that the tiny nodules seen in the RML area on CT Abd were likely benign & reassured her that we would follow this area closely w/ CXR in 41mo & CT Chest in 38mo; in the meanwhile she will continue her current meds & maintain follow up w/ her PCP & various specialists as needed...   ~  January 17, 2016:  49mo ROV & Heather [name] notes that her breathing is actually doing very well- SOB at baseline, denies CP, palpit, cough, sput, hemoptysis, etc... However she continues to have a litany of somatic complaints that she enumerates at each visit> she tells me that she has left [name] because he didn't know anything else to do & is now seeing Alphonsa Gin at [facility]; he GI is DrEdwards... "I'm noting but skin & bones", "foood don't do me no good", "my appetite is good & I eat soul food", "I know what's going on w/ me" but she didn't elaborate, "some meds don't agree w/ others", last med was $400 she says (from Urology), "there's nothing in my stomach except my inards", she was upset w/ AVS showing diagnosis of anxiety...     AB> she c/o cough "hoking little white balls"; baseline CXRs have been clear (old right rib fxs), PFT in 2013 was wnl, oxygenation is wnl, and exam is clear; she has been asked to use Mucinex Qid but she won't do it...    Abn CT scan w/ scat pulm nodules> prev CT Chest w/ tiny left apical opac, unchanged serially & felt to be benign; CT Abd done 05/17/15 w/  tiny 3-67mm RML  nodules detected, otherw neg & we discussed f/u CXR in 30mo & CT Chest in 37yr.     HxCP> on ASA81; followed by Heather [name]/ DrNishan for Cards & their prev notes are reviewed=> neg ischemic work up previously...     GI- HH, Gastirits, Divertics, chronic pain & "swelling"> on Protonix40Bid plus Zantac, Miralax & Senakot-S, etc; she persists w/ mult GI complaints despite prev evals by [facility], [facility], [facility]; most recent testing 09/2015 by DrJEdwards- see imaging studies below;  ENT eval by DrJRosen...    Mult Medical Issues> now followed by PCP DrHusain EXAM reveals Afeb, VSS, O2sat=99% on RA;  HEENT- neg x sl dry MMs;  Chest- she has a forced dry hacking cough that is nonproductive, clear w/o w/r/r;  Heart- RR w/o m/r/g heard;  Abd- soft, nontender, neg...   EKG 08/2015>  NSR, rate74, RBBB, otherw neg ekg...  CXR 09/2105>  Borderline heart size, tortuous Ao, clear sl hyperexpanded lungs, no adenopathy- NAD...   LABS in Epic 09/2015>  Chems- wnl;  CBC- wnl  Abd Sonar 11/2015> heterogeneous liver texture- rec MRI for further eval...  MRI Abd 11/2015> no suspicious hepatic lesions, incidental tiny cyst noted, otherw MRI of Abd was neg...   MBS 11/2015> mod oral phase dysphagia, see full report IMP/PLAN>>  Heather [name] is stable from the pulm standpoint;  She has mult somatic complaints & a large physician contingent attending to her various symptoms;  I suggested to her that she would be best served by taking the Xanax regularly 0.5mg - 1/2 to 1 tab Tid regularly...   ~  May 28, 2016:  73mo ROV & Heather [name] breathing is at baseline but she complained to her PCP about DOE w/ walking (uses walker) & he told her "I better get it checked out";  Today c/o "I'm just a frame- all skin & bones", "I stay hungry", "I eat soul food & I can't eat no more", "they've done a lot of tests and can't find nothing"; rambling hx & flight of ideas- she is c/o dental issues, ribs sticking out, & still c/o a knot in her  left flank area-- I reminded her to be sure to mention all this to her GI Oletta Lamas), Ortho Durward Fortes) & PCP Lysle Rubens) so he can check this out;  We talked about her abd/ back/ legs/ skin/ etc, she wants a stong pain pill, she says she's lonely at 46- "all my friend's are gone"...    From the pulmonary standpoint-- she c/o being winded after long walks (uses walke- note: she had outpt PT 01/2016), cough w/ little white balls, etc;  Meds include Flonase Qhs and Mucinex600Bid w/ fluids;  Exam is clear & XRay/Labs in the last yr all wnl...  EXAM reveals Afeb, VSS, O2sat=98% on RA;  HEENT- neg x sl dry MMs;  Chest- she has a forced dry hacking cough that is nonproductive, clear w/o w/r/r;  Heart- RR w/o m/r/g heard;  Abd- soft, nontender, neg...   Art Dopplers of LEs 03/31/16>  Normal ABIs & no evid of arterial occlusive dis  LABS 04/2016 in epic>  Chems- wnl;  CBC- wnl  CT Abd & Pelvis 05/02/16>  Mod stool burden in right colon, no obstruction, NAD (she was seen in the Er for abd pain)  She had Speech Path swallowing eval 05/22/16>  Note reviewed- mild oral phase & pharyngeal dysphagia, no aspiration or penetration, esoph dysmotility; recommendations given to pt...  IMP/PLAN>>  Heather [name] appears stable- chest is clear, oxygenation  excellent, and she presents w/ the same mult somatic complaints; she still won't try the Xanax as I've suggested; f/u w/ me as needed...  ~  December 01, 2016:  3mo ROV & pulmonary followup visit>  Heather [name] has established primary care w/ Wilfred Lacy, NP in [facility] office; since I saw her last she has had 5+ PCP office visits and 7+ ER visits for a variety of complaints- divertics, ecchymoses, right arm pain, left groin discomfort;  Today she speaks rapidly w/ pressure of speech (barely taking a breath betw words), flight of ideas and mult somatic complaints... "Too much medicine for my age" "I'm nothing but a frame" she is still convinced there is something in her abd despite  mult evaluations over the yrs....    AB> she c/o cough "hoking little white balls"; baseline CXRs have been clear (old right rib fxs), PFT in 2013 was wnl, oxygenation is wnl, and exam is clear; she has been asked to use Mucinex Qid but she won't do it...    Abn CT scan w/ scat pulm nodules> prev CT Chest w/ tiny left apical opac, unchanged serially & felt to be benign; CT Abd done 05/17/15 w/ tiny 3-69mm RML nodules detected, otherw neg & we discussed f/u CXR in 77mo & CT Chest in 42yr.     HxCP> on Coreg3.125Bid & Lasix40; BP=110/60 & she has a pos ROS; followed by Heather [name]/ [name] for Cards & their prev notes are reviewed=> neg ischemic work up previously...     GI- HH, Gastirits, Divertics, chronic pain & "swelling"> on Protonix40Bid plus Zantac, Miralax & Senakot-S, etc; she persists w/ mult GI complaints despite prev evals by [facility], [facility], [facility]; most recent testing 09/2015 by DrJEdwards- see imaging studies below;  ENT eval by DrJRosen...    Mult Medical Issues> now followed by PCP-CNche, but she has switched several times... EXAM reveals Afeb, VSS, O2sat=99% on RA;  HEENT- neg x sl dry MMs;  Chest- she has a forced dry hacking cough that is nonproductive, clear w/o w/r/r;  Heart- RR w/o m/r/g heard;  Abd- soft, nontender, neg...   CXR 07/28/16>  Mild cardiomegaly, left basilar atelectasis, distal left clavicular fx & old right post rib fxs...  CT Abd & Pelvis 10/2016>  Lung bases clear, aortic atherosclerosis, neg abd structures and no obstructive uropathy...  LABS 10/2016 in epic> Chems- wnl;  CBC- wnl IMP/PLAN>>  Heather [name] is stable from there pulmonary standpoint; encouraged to use Mucinex & drink plenty of water; needs to be careful & avoid falling!           Problem List:  RESPIRATORY SYMPTOMS >> c/o "hocking phlegm" DYSPNEA (ICD-786.9) - eval 5/11 c/o SOB- can't get a DB, can't get enough air in, etc... occurs at rest & w/ exerc "I'm very active despite my arthritis",  talks rapidly in long sentences w/o apparent distress... no cough, phlegm, hemoptysis, etc- but as usual she is quite distressed by these symptoms and wants further eval> we discussed re-assessment w/ CXR (clear, NAD);  PFT (totally norm airflow);  Labs (all WNL);  Rx w/ ALPRAZOLAM 0.5mg  Tid & encouraged to take regularly. ~  CXR 4/13 showed norm heart size, clear lungs, mild TSpine spondylosis... ~  She continues to complain of drainage, throat symptoms, etc; Rx w/ Mucinex, OTC antihist, Nasonex, etc;  Seen by ENT, DrRosen w/ ?xerostomia & rec incr water etc. ~  9/13: Add-on visit for "hocking, it's not a cough, it's a hock"> see above... ~  9/13:  CXR shows sl overinflation, clear lungs, normal heart size w/ elongation & tortuous Ao;  PFTs are wnl> FVC=2.62 (128%), FEV1=2.20 (158%), %1sec=84; mid-flows=274% of predicted... We discussed trial rx for her symptoms- Mucinex, Fluids, Hycodan, Dulera100- 1puffbid. ~  10/13: she reports INTOL to [facility] w/ throat & mouth symptoms "like glue" therefore stopped this med; advised to continue the others. ~  3/14:  CT Chest (done after call from Ortho PA regarding her symptoms)=> then CTA (done via an ER visit) showed no PE, no acute findings, +atherosclerotic changes, several scattered tiny nodules 3-43mm size & no change from 2009 scan, s/p left mastectomy, DJD Tspine...  ~  4/14: she is asked to increase the Mucinex600mg  to [facility] + plenty of water daily... ~  10/14: CXR showed normal heart size w/ tortuous atherosclerotic Ao, clear lungs w/ ?vague opac left mid zone adjacent to hilum (nothing seen in this area on CT 3/14) ?overlying shadows & we will f/u...  ~  CXR 04/04/14 shows stable heart size, atherosclerotic & tortuous Ao, mild hyperinflation, clear lungs w/o acute changes, DJD Tspine... ~  8/15 & 11/15> she remains stable from the pulmonary standpoint on Mucinex600Qid + Fluids; rec to follow better antireflux regimen for her cough, LPR, choking...  ~  CXR  03/22/15 showed stable cardiomeg, chronic changes, old right rib fxs, NAD ~  She went to the ER 05/17/15 w/ GI complaints and the ER physician ordered a CT Abd & Pelvis> a few tiny 3-22mm nodules were noted in the lateral aspect of the RML, the abd revealed aortoiliac calcif w/o aneurysm, right renal cyst, otherw no signif abn noted (Radiology rec f/u CT Chest in 87yr); she is very concerned and I indicated that I thought this was prob some scar tissue but we would recheck her CXR in 68mo & follow up the CT in 7yr.Heather [name] HAS ESTABLISHED w/ DrHUSAIN from Heather [name] at [place] (prev Heather [name] at [name])  HYPERTENSION (ICD-401.9) -  ~  on AMLODIPINE 2.5mg /d,  COREG 3.125mg Bid,  LASIX 40mg - ?taking 1/2tab/d or just using it prn swelling... ~  8/16: her meds have been adjusted> on Coreg3.125Bid, Lasix40, off Amlod; BP= 106/60 & she persists w/ mult somatic complaints; last 2DEcho 10/2009 showed norm LV size & func w/ EF=55-60%, mildAI.  Hx of CHEST PAIN (ICD-786.50) - she takes ASA 81mg /d + Tylenol Prn... followed by Heather [name] for Cards. ~  Baseline EKG w/ RBBB & LAD, no acute changes... ~  NuclearStressTest done 2007 & 2009 showed normal- without scar or ischemia, EF=67%...   ~  2DEcho 7/08 showed mild AI, mild diastolic dysfunction, [id number]. ~  CT Abd 1/09 & 9/12 showed incidental coronary calcif, & aorto-iliac atherosclerotic changes as well... ~  8/13: she saw DrNishan> HBP, DD, atypCP w/ neg Myoviews & no changes made... ~  10/13:  She uses a back brace/ abd binder to help her CWP... ~  EKG 9/15 showed NSR, rate81, LAD, RBBB, NSSTTWA, NAD...  VENOUS INSUFFICIENCY (ICD-459.81) - she has mod VI and follows a low sodium diet, elevates legs when able, and wears support hose. ~  7/12:  C/o incr edema & we reviewed a 2gm sodium diet & increased her Lasix from 20mg /d to 40mg /d... ~  5/13:  She has persist complaints but mild chr VI w/ min edema & reminded to elim sodium, elev, support hose, take the  Lasix... ~ 11/13: she went to the ER w/ edema; VDopplers were neg for DVT; they reiterated the recs for no salt,  elevation, support hose, & continue Lasix40. ~  She has persistent edema in LEs and rec to avoid sodium, elev legs, wear support hose...  HIATAL HERNIA (ICD-553.3) & GASTRITIS (ICD-535.50) >>  ~  on OMEPRAZOLE 20mg /d & followed by DrJEdwards for GI having fired DrStark & [facility] in past. ~  last EGD was 2/08 w/ 3cm HH & gastitis... ~  CT Abd 1/09 revealed calcif gallstones, & mild ectasia of AA... ~  CT Abd 1/11 showed left colon divertics, tiny hepatic lesions w/o change & right renal cyst stable. ~  CT Abd 9/12 showed divertics (no inflamm), right renal & left hep lobe cysts, atherosclerotic changes, NAD... ~  Seen by DrEdwards 4/13> c/o dysphagia & congestion; he notes mult GI complaints, known esoph dysmotility w/o stricture, hard to pin down her complaints... ~  She has followed up w/ DrStark regarding her concerns about swelling in her abd... ~  CT Abd Pelvis 3/14 showed Tiny cysts in the lateral segment left hepatic lobe, 1.4 cm right upper pole renal cyst, atherosclerotic calcifications of the abdominal aorta and branch vessels, DJD spine... ~  2015: now on Protonix40Bid but not taking regularly & advised BID dosing & antireflux regimen for her LPR...   DIVERTICULOSIS, COLON (ICD-562.10) - followed regularly by DrStark. ~  colonoscopy 7/03 by DrStark showing divertics only... we discussed Miralax + Senakot-S for constipation. ~  colonoscopy 2/11 showed divertics, 49mm polyp, hems...  ? of ABDOMINAL PAIN & SYMPTOMATOLOGY - she had a very thorough work up in 2009> she has a cancer phobia and is very difficult to reassure her that everything appropriate has been done & no cancer found... Mult additional GI evals since then from  GI, [name] (DrEdwards), [facility] (DrThorne)... ~  eval by GI- DrStark 12/08 but pt wasn't satisfied w/ his examination... ~  full labs 1/09 were WNL,  sed=20... ~  CT Abd/ Pelvis 1/09= no acute abn in abd (calcif gallstones & mild ectasia of AA), calcif fibroid in the pelvis, & ?regarding the left ovary. ~  referral to DrFontaine, GYN> his notes are reviewed... ~  follow up w/ Heather [name], Urology> his notes are reviewed... ~  eval DrWhitfield for Orthopedics- rec shots in back by [facility] w/ some improvement. ~  repeat GI eval 1/11 by DrStark w/ CT & colonoscpy as above- no acute problems. ~  5/11 & 7/11 >> persist complaints w/ neg recheck by DrStark & Heather [name]... ~  12/11:  routine GYN surveillance DrFontaine found anteverted uterus w/ fibroids, atrophic ovaries bilat, no mass. ~  Repeat GI eval 4/12 by DrStark> he tried to reassure the pt, felt some symptoms might be from poor abd wall muscle tone, rec continue Omep, laxatives... ~  6/12:  She sought another GI opinion DrEdwards> he did not feel she needed any additional testing... ~  She tells me she has yet another appt to see a gastroenterologist at [facility]==> seen by DrThorne 8/12 & note reviewed... ~  EGD by DrThorne 9/12 w/ mult whitish nodular plaques in cardia & fundus> Bx results pending & she is very concerned (remember her hx cancer phobia). ~  She continues to f/u w/ DrThorne at [facility] & is happy w/ her eval... ~  Prev back w/ DrStark at [name] => then switched to DrEdwards at [place], GI.Marland Kitchen.  ~  CT Abd & Pelvis 05/17/15 via ER showed a few tiny 3-47mm nodules were noted in the lateral aspect of the RML, the abd revealed aortoiliac calcif w/o aneurysm,  right renal cyst, otherw no signif abn noted (Radiology rec f/u CT Chest in 68yr).  RENAL CYST (ICD-593.2) - she has a 1 cm right renal cyst seen on CT in 2007 and followed by [name]... f/u CT Abd 1/09 - no change in the cyst... f/u renal ultrasound 1/10 w/ small bilat cysts... f/u CT Abd 1/11- no change in cyst. ~  she saw Heather [name] 7/11 for LBP, microhematuria, urge incontinence- stable, no additional Rx. ~  she saw Heather [name]  1/12 for urge incontinence, freq, nocturia> rec Desmopressin 0.2mg - 1/2 tab Qhs. ~  No change in the renal cyst over the yrs...  CARCINOMA, BREAST, LEFT (ICD-174.9) - Surgery 9/94 by DrBlievernicht w/ Left modified radical mastectomy... +estrogen receptors and Rx w/ Tamoxifen... f/u mammograms all neg... breast exam on right has been neg- no nodules palpated... She says that DrBertrand insisted that her PCP check her right breast at each & every office visit to be sure she doesn't develop any nodules...  DEGENERATIVE JOINT DISEASE (ICD-715.90) - on OTC meds + VICODIN Prn... she has end-stage OA of Right Knee per DrWhitfield treated w/ shots in the past... she has a knee brace and a cane for ambulation... ~  8/13:  She wants a better pain pill & we discussed change to [place] prn... ~  12/13: she tells me that DrWhitfield injected her knee last wk- improved...  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 Rx w/ rest, heat, Percocet, and f/u w/ Ortho...  Hx of LOW BACK PAIN, CHRONIC (ICD-724.2) - eval DrWhitfield w/ rec for shots by DrNewton & she's feeling sl better & wears back brace... ~  5/10: mult somatic complaints- primarily c/o left side/ postero-lat rib pain...  Exam w/ tender left lat/ post ribs... Bone Scan= neg... ~  MRI Lumbar spine 7/10 by DrWhitfield... ~  Epidural steroid shot by DrNewton 8/10 & 1/11 ~  Repeat LBP eval by DrWhitfield w/ another MRI lumbar spine showing degenerative changes w/ mild progression since 7/10 MRI, mild-mod sp stenosis & lat recess stenosis at several levels ~  DrWhitfield referred her to [facility] to consider more shots but she is not so inclined... ~  6/13: she saw DrTooke w/ lumbar spondylosis; he felt that he had nothing to offer her & rec incr Tramadol to Qid... ~  2014:  She has had numerous visits w/ DrWhitfield/ Ernestina Patches for injections, XRays, scans etc...  CARPAL  TUNNEL SYNDROME, BILATERAL (ICD-354.0) - she had prev right carpal tunnel release by DrWhitfield and was c/o increasing discomfort in her left wrist despite wrist splint Qhs- had left carpal tunnel release 1/10 by DrWhitfield...  ANXIETY (ICD-300.00) - on ALPRAZOLAM 0.5mg  Tid & encouraged to take it regularly... she has a cancer phobia & it is very difficult to reassure her regarding her symptoms and our evaluations... ~  7/13: she went to the ER c/o swelling of the tongue but eval by EDP indicated that her tongue was normal & not swollen, she was not on an ACE, given Pred Rx anyway. ~  She has been asked to incr the ALprazolam to regular dosing but she is reluctant...   Past Surgical History:  Procedure Laterality Date  . CARPAL TUNNEL RELEASE    . Left mastectomy      Outpatient Encounter Prescriptions as of 12/01/2016  Medication Sig Dispense Refill  . acetaminophen (TYLENOL) 500 MG tablet Take 2 tablets (1,000 mg total) by mouth every 6 (six)  hours as needed. (Patient taking differently: Take 1,000 mg by mouth every 6 (six) hours as needed for mild pain. ) 30 tablet 1  . acetaminophen-codeine (TYLENOL #2) 300-15 MG tablet Take 1-2 tablets by mouth every 6 (six) hours as needed for moderate pain. 30 tablet 0  . ALPRAZolam (XANAX) 0.5 MG tablet Take 0.25-0.5 mg by mouth 3 (three) times daily as needed for anxiety or sleep.     . carvedilol (COREG) 3.125 MG tablet Take 1 tablet (3.125 mg total) by mouth 2 (two) times daily. 180 tablet 2  . Cholecalciferol (VITAMIN D3) 1000 UNITS CAPS Take 1,000 Units by mouth daily. For your bones    . diclofenac sodium (VOLTAREN) 1 % GEL Apply 2 g topically 2 (two) times daily. 100 g 0  . feeding supplement, ENSURE ENLIVE, (ENSURE ENLIVE) LIQD Take 237 mLs by mouth 2 (two) times daily between meals. 237 mL 12  . furosemide (LASIX) 40 MG tablet Take 1 tablet (40 mg total) by mouth daily. 30 tablet 3  . levocetirizine (XYZAL) 5 MG tablet Take 5 mg by mouth  every evening.    . Multiple Vitamins-Minerals (MULTIVITAMINS THER. W/MINERALS) TABS Take 1 tablet by mouth daily.    . pantoprazole (PROTONIX) 40 MG tablet Take 1 tablet (40 mg total) by mouth 2 (two) times daily. 60 tablet 11  . polyethylene glycol powder (GLYCOLAX/MIRALAX) powder Take 17 g by mouth daily as needed. 3350 g 1   No facility-administered encounter medications on file as of 12/01/2016.     Allergies  Allergen Reactions  . Fentanyl Other (See Comments)    REACTION: nausea - pt states that she is not allergic  REACTION: nausea - pt states that she is not allergic     Current Medications, Allergies, Past Medical History, Past Surgical History, Family History, and Social History were reviewed in [employer] record.    Review of Systems       See HPI - all other systems neg except as noted... She has mult somatic complaints and anxiety. The patient complains of decreased hearing, chest discomfort, dyspnea on exertion, abdominal pain, incontinence, muscle weakness, and difficulty walking.  The patient denies anorexia, fever, vision loss, hoarseness, syncope, peripheral edema, prolonged cough, headaches, hemoptysis, melena, hematochezia, severe indigestion/heartburn, hematuria, transient blindness, depression, abnormal bleeding, enlarged lymph nodes, and angioedema.     Objective:   Physical Exam    WD, WN, 83 y/o BF in NAD... she is very anxious... GENERAL:  Alert & oriented; pleasant & cooperative... HEENT:  [place]/AT, EOM-wnl, PERRLA, EACs-clear, TMs-wnl, NOSE-clear, THROAT-clear & wnl, no lesions seen. NECK:  Neck ROM decreased, no JVD; normal carotid impulses w/o bruits; no thyromegaly or nodules palpated; no lymphadenopathy. CHEST:  Clear to P & A; without wheezes/ rales/ or rhonchi heard... BREAST:  s/p left mastectomy... right breast normal- without nodules palpated... HEART:  Regular Rhythm; without murmurs/ rubs/ or gallops detected... ABDOMEN:   Soft & nontender; normal bowel sounds; no organomegaly or masses palpated... EXT: without deformities, mod arthritic changes; no varicose veins/ +venous insuffic/ tr edema. NEURO:  CN's intact; no focal neuro deficits... DERM:  Small sl excoriated area of dermatitis left shin- no drainage,no rash, etc...  RADIOLOGY DATA:  Reviewed in the EPIC EMR & discussed w/ the patient...  LABORATORY DATA:  Reviewed in the EPIC EMR & discussed w/ the patient...   Assessment & Plan:    RESPIRATORY SYMPTOMS w/ Dyspnea & throat clearing, prob LPR>  She had a neg  pulm eval 2011 and numerous times thereafter; she insisted on additional eval 9/13 for her chr throat clearing symptom; CXR w/o acute changes & PFTs normal for her 83 y/o lungs; she is quite insistent on med rx for her problem but has not had relief from anything; REC to take [id number], Fluids, DELSYM prn; pt very anxious & Alprazolam seems to help when she takes it... 11/15> she has been taking the OTC MUCINEX 600mg  Qid w/ fluids and seems sl improved; rec strict antireflux regimen w/ Protonix before dinner, NPO after dinner, elev HOB 6" blocks... 5/16 - 8/16> she is once again off all these meds and c/o incr symptoms; rec to restart the Mucinex, fluids, and antireflux regimen... 01/17/16>   Delona is stable from the pulm standpoint;  She has mult somatic complaints & a large physician contingent attending to her various symptoms;  I suggested to her that she would be best served by taking the Xanax regularly 0.5mg - 1/2 to 1 tab Tid regularly (she never did). 05/28/16>   Lidiya appears stable- chest is clear, oxygenation excellent, and she presents w/ the same mult somatic complaints; she still won't try the Xanax as I've suggested; f/u w/ me as needed... 12/01/16>   Vela is stable from there pulmonary standpoint; encouraged to use Mucinex & drink plenty of water; needs to be careful & avoid falling!   Abn CT Abd w/ several tiny nodules  reported in the RML area>  We discussed this & decided to f/u CXR in 13mo & CT Chest in 86yr...   Hx CP> evaluated for Cards by DrBensimon & he has not been in favor of invasive testing; stable on conservative med rx...  GI> HH, Gastritis, Divertics, vague abd pain>  I tried once again to reassure her about the thorough GI evaluations that her has undergone; she is happy w/ DrThorne's eval & will f/u w/ her & in the meanwhile continue rx w/ [id number], Miralax, Metamucil, Senakot, Mylicon, etc... She has re-established w/ [facility]- their notes reviewed...  Hx left Breast Cancer>  No known recurrence...  Anxiety>  Asked once again to increase the Alprazolam to 1/2 - 1 tab TID regularly...   Patient's Medications  New Prescriptions   No medications on file  Previous Medications   ACETAMINOPHEN (TYLENOL) 500 MG TABLET    Take 2 tablets (1,000 mg total) by mouth every 6 (six) hours as needed.   ACETAMINOPHEN-CODEINE (TYLENOL #2) 300-15 MG TABLET    Take 1-2 tablets by mouth every 6 (six) hours as needed for moderate pain.   ALPRAZOLAM (XANAX) 0.5 MG TABLET    Take 0.25-0.5 mg by mouth 3 (three) times daily as needed for anxiety or sleep.    CARVEDILOL (COREG) 3.125 MG TABLET    Take 1 tablet (3.125 mg total) by mouth 2 (two) times daily.   CHOLECALCIFEROL (VITAMIN D3) 1000 UNITS CAPS    Take 1,000 Units by mouth daily. For your bones   DICLOFENAC SODIUM (VOLTAREN) 1 % GEL    Apply 2 g topically 2 (two) times daily.   FEEDING SUPPLEMENT, ENSURE ENLIVE, (ENSURE ENLIVE) LIQD    Take 237 mLs by mouth 2 (two) times daily between meals.   FUROSEMIDE (LASIX) 40 MG TABLET    Take 1 tablet (40 mg total) by mouth daily.   LEVOCETIRIZINE (XYZAL) 5 MG TABLET    Take 5 mg by mouth every evening.   MULTIPLE VITAMINS-MINERALS (MULTIVITAMINS THER. W/MINERALS) TABS    Take 1 tablet by mouth  daily.   PANTOPRAZOLE (PROTONIX) 40 MG TABLET    Take 1 tablet (40 mg total) by mouth 2 (two) times daily.   POLYETHYLENE  GLYCOL POWDER (GLYCOLAX/MIRALAX) POWDER    Take 17 g by mouth daily as needed.  Modified Medications   No medications on file  Discontinued Medications   No medications on file

## 2016-12-01 NOTE — Patient Instructions (Signed)
Today we updated your med list in our EPIC system...    Continue your current medications the same...  I reviewed your recent blood work & scans- everything looks good...  Please be careful and avoid falls etc...  Call for any questions...  Let's plan a follow up visit in 64mo, sooner if needed for breathing problems.Marland KitchenMarland Kitchen

## 2016-12-08 ENCOUNTER — Telehealth: Payer: Self-pay | Admitting: Nurse Practitioner

## 2016-12-08 NOTE — Telephone Encounter (Signed)
Pt called in and said that some of her meds are making her sick .  The acetamicode 300 mg is not help and is making her feel sick.  Anything else that could help ?

## 2016-12-09 DIAGNOSIS — N3281 Overactive bladder: Secondary | ICD-10-CM | POA: Diagnosis not present

## 2016-12-10 NOTE — Telephone Encounter (Signed)
Please have patient use tramadol for pain. She can stop acetaminophen.

## 2016-12-11 DIAGNOSIS — H04123 Dry eye syndrome of bilateral lacrimal glands: Secondary | ICD-10-CM | POA: Diagnosis not present

## 2016-12-11 DIAGNOSIS — H40013 Open angle with borderline findings, low risk, bilateral: Secondary | ICD-10-CM | POA: Diagnosis not present

## 2016-12-11 DIAGNOSIS — H353131 Nonexudative age-related macular degeneration, bilateral, early dry stage: Secondary | ICD-10-CM | POA: Diagnosis not present

## 2016-12-11 DIAGNOSIS — H02054 Trichiasis without entropian left upper eyelid: Secondary | ICD-10-CM | POA: Diagnosis not present

## 2016-12-11 DIAGNOSIS — H1851 Endothelial corneal dystrophy: Secondary | ICD-10-CM | POA: Diagnosis not present

## 2016-12-16 ENCOUNTER — Ambulatory Visit (INDEPENDENT_AMBULATORY_CARE_PROVIDER_SITE_OTHER): Payer: Medicare Other | Admitting: Nurse Practitioner

## 2016-12-16 ENCOUNTER — Encounter: Payer: Self-pay | Admitting: Nurse Practitioner

## 2016-12-16 ENCOUNTER — Telehealth: Payer: Self-pay

## 2016-12-16 VITALS — BP 130/66 | HR 76 | Temp 97.7°F | Ht 62.0 in | Wt 122.0 lb

## 2016-12-16 DIAGNOSIS — F411 Generalized anxiety disorder: Secondary | ICD-10-CM | POA: Diagnosis not present

## 2016-12-16 DIAGNOSIS — J310 Chronic rhinitis: Secondary | ICD-10-CM | POA: Diagnosis not present

## 2016-12-16 DIAGNOSIS — M19041 Primary osteoarthritis, right hand: Secondary | ICD-10-CM

## 2016-12-16 DIAGNOSIS — M19042 Primary osteoarthritis, left hand: Secondary | ICD-10-CM

## 2016-12-16 MED ORDER — DICLOFENAC SODIUM 1 % TD GEL: 2.0000 g | Freq: Two times a day (BID) | TRANSDERMAL | 1 refills | Status: DC | PRN

## 2016-12-16 MED ORDER — LEVOCETIRIZINE DIHYDROCHLORIDE 5 MG PO TABS: 5.0000 mg | ORAL_TABLET | Freq: Every evening | ORAL | 3 refills | Status: DC

## 2016-12-16 MED ORDER — ALPRAZOLAM 0.25 MG PO TABS: 0.2500 mg | ORAL_TABLET | Freq: Two times a day (BID) | ORAL | 0 refills | Status: DC | PRN

## 2016-12-16 NOTE — Progress Notes (Signed)
Pre visit review using our clinic review tool, if applicable. No additional management support is needed unless otherwise documented below in the visit note. 

## 2016-12-16 NOTE — Progress Notes (Signed)
Subjective:  Patient ID: Heather [name], female    DOB: [birth year]  Age: 83 y.o. MRN: [id number]  CC: Hand Pain (right arm pain)  Hand Pain   The incident occurred more than 1 week ago. There was no injury mechanism. The pain is present in the right hand. The quality of the pain is described as aching. The pain does not radiate. The pain is moderate. The pain has been fluctuating since the incident. Pertinent negatives include no chest pain, muscle weakness, numbness or tingling. The symptoms are aggravated by palpation and movement. She has tried acetaminophen for the symptoms. The treatment provided mild relief.    Outpatient Medications Prior to Visit  Medication Sig Dispense Refill  . acetaminophen (TYLENOL) 500 MG tablet Take 2 tablets (1,000 mg total) by mouth every 6 (six) hours as needed. (Patient taking differently: Take 1,000 mg by mouth every 6 (six) hours as needed for mild pain. ) 30 tablet 1  . acetaminophen-codeine (TYLENOL #2) 300-15 MG tablet Take 1-2 tablets by mouth every 6 (six) hours as needed for moderate pain. 30 tablet 0  . carvedilol (COREG) 3.125 MG tablet Take 1 tablet (3.125 mg total) by mouth 2 (two) times daily. 180 tablet 2  . Cholecalciferol (VITAMIN D3) 1000 UNITS CAPS Take 1,000 Units by mouth daily. For your bones    . feeding supplement, ENSURE ENLIVE, (ENSURE ENLIVE) LIQD Take 237 mLs by mouth 2 (two) times daily between meals. 237 mL 12  . furosemide (LASIX) 40 MG tablet Take 1 tablet (40 mg total) by mouth daily. 30 tablet 3  . Multiple Vitamins-Minerals (MULTIVITAMINS THER. W/MINERALS) TABS Take 1 tablet by mouth daily.    . pantoprazole (PROTONIX) 40 MG tablet Take 1 tablet (40 mg total) by mouth 2 (two) times daily. 60 tablet 11  . polyethylene glycol powder (GLYCOLAX/MIRALAX) powder Take 17 g by mouth daily as needed. 3350 g 1  . ALPRAZolam (XANAX) 0.5 MG tablet Take 0.25-0.5 mg by mouth 3 (three) times daily as needed for anxiety or sleep.     Marland Kitchen  diclofenac sodium (VOLTAREN) 1 % GEL Apply 2 g topically 2 (two) times daily. 100 g 0  . levocetirizine (XYZAL) 5 MG tablet Take 5 mg by mouth every evening.     No facility-administered medications prior to visit.     ROS See HPI  Objective:  BP 130/66   Pulse 76   Temp 97.7 F (36.5 C)   Ht 5\' 2"  (1.575 m)   Wt 122 lb (55.3 kg)   SpO2 98%   BMI 22.31 kg/m   BP Readings from Last 3 Encounters:  12/16/16 130/66  12/01/16 110/60  11/26/16 138/68    Wt Readings from Last 3 Encounters:  12/16/16 122 lb (55.3 kg)  12/01/16 119 lb (54 kg)  11/26/16 121 lb (54.9 kg)    Physical Exam  Constitutional: She is oriented to person, place, and time. No distress.  Cardiovascular: Normal rate.   Pulmonary/Chest: Effort normal.  Musculoskeletal: She exhibits tenderness. She exhibits no edema.       Right elbow: Normal.      Right wrist: She exhibits tenderness. She exhibits normal range of motion, no bony tenderness, no swelling, no effusion and no deformity.       Right forearm: Normal.       Right hand: She exhibits tenderness and deformity. She exhibits normal range of motion, no bony tenderness and no swelling. Normal sensation noted. Normal strength noted.  Hypertrophy  of DIP and PIP. No joint deviation.  Neurological: She is alert and oriented to person, place, and time.  Skin: Skin is warm and dry. No rash noted. No erythema.  Vitals reviewed.   Lab Results  Component Value Date   WBC 5.2 11/11/2016   HGB 12.6 11/11/2016   HCT 38.2 11/11/2016   PLT 213 11/11/2016   GLUCOSE 97 11/11/2016   CHOL 199 02/02/2014   TRIG 20.0 02/02/2014   HDL 98.70 02/02/2014   LDLCALC 96 02/02/2014   ALT 19 11/11/2016   AST 33 11/11/2016   NA 141 11/11/2016   K 4.1 11/11/2016   CL 104 11/11/2016   CREATININE 1.02 (H) 11/11/2016   BUN 26 (H) 11/11/2016   CO2 29 11/11/2016   TSH 2.67 11/30/2014    Ct Abdomen Pelvis Wo Contrast  Result Date: 11/12/2016 CLINICAL DATA:  Left flank  and groin pain EXAM: CT ABDOMEN AND PELVIS WITHOUT CONTRAST TECHNIQUE: Multidetector CT imaging of the abdomen and pelvis was performed following the standard protocol without IV contrast. COMPARISON:  CT abdomen pelvis 05/02/2016 FINDINGS: The lack of intravenous and oral contrast somewhat limits the study, as the bowel is closely space throughout the abdomen and there is poor contrast resolution between the bowel and the retroperitoneal structures. Lower chest: No pulmonary nodules or pleural effusion. No visible pericardial effusion. Hepatobiliary: Normal noncontrast appearance of the liver. No visible biliary dilatation. Normal gallbladder. Pancreas: Normal noncontrast appearance of the pancreas. No peripancreatic fluid collection. Spleen: Normal. Adrenal glands: Normal. Urinary Tract: --Right kidney: No hydronephrosis or perinephric stranding. No nephrolithiasis. No obstructing ureteral stones. --Left kidney: No hydronephrosis or perinephric stranding. No nephrolithiasis. No obstructing ureteral stones. --Urinary bladder: Unremarkable. Stomach/Bowel: No dilated loops of bowel. No evidence of colonic or enteric inflammation. No fluid collection within the abdomen. Moderate amount of stool within the colon. Vascular/Lymphatic: There is atherosclerotic calcification of the non aneurysmal abdominal aorta. No visible abdominal or pelvic lymphadenopathy. Reproductive: No focal abnormality identified. Musculoskeletal. Multilevel lumbar osteophytosis and facet arthrosis. No bony spinal canal stenosis. IMPRESSION: 1. No obstructive uropathy. 2. Assessment of the bowel is limited by the lack of p.o. and intravenous contrast, causing poor contrast resolution between the bowel and adjacent retroperitoneal structures. Within that limitation, there is a moderate amount of stool within the colon but no acute abnormality of the bowel. 3. Aortic atherosclerosis. Electronically Signed   By: Ulyses Jarred M.D.   On: 11/12/2016  04:19    Assessment & Plan:   Jalanie was seen today for hand pain.  Diagnoses and all orders for this visit:  Primary osteoarthritis of both hands -     diclofenac sodium (VOLTAREN) 1 % GEL; Apply 2 g topically 2 (two) times daily as needed.  Rhinitis, unspecified chronicity, unspecified type -     levocetirizine (XYZAL) 5 MG tablet; Take 1 tablet (5 mg total) by mouth every evening.  Anxiety state -     ALPRAZolam (XANAX) 0.25 MG tablet; Take 1 tablet (0.25 mg total) by mouth 2 (two) times daily as needed for anxiety.   I have discontinued Ms. Strayer's ALPRAZolam. I have also changed her diclofenac sodium and levocetirizine. Additionally, I am having her start on ALPRAZolam. Lastly, I am having her maintain her Vitamin D3, multivitamins ther. w/minerals, pantoprazole, feeding supplement (ENSURE ENLIVE), acetaminophen, carvedilol, furosemide, acetaminophen-codeine, and polyethylene glycol powder.  Meds ordered this encounter  Medications  . diclofenac sodium (VOLTAREN) 1 % GEL    Sig: Apply 2 g topically 2 (  two) times daily as needed.    Dispense:  100 g    Refill:  1    Order Specific Question:   Supervising Provider    Answer:   Cassandria Anger [[id number]]  . levocetirizine (XYZAL) 5 MG tablet    Sig: Take 1 tablet (5 mg total) by mouth every evening.    Dispense:  30 tablet    Refill:  3    Order Specific Question:   Supervising Provider    Answer:   Cassandria Anger [[id number]]  . ALPRAZolam (XANAX) 0.25 MG tablet    Sig: Take 1 tablet (0.25 mg total) by mouth 2 (two) times daily as needed for anxiety.    Dispense:  30 tablet    Refill:  0    Order Specific Question:   Supervising Provider    Answer:   Cassandria Anger [[id number]]    Follow-up: Return if symptoms worsen or fail to improve.  Wilfred Lacy, NP

## 2016-12-16 NOTE — Patient Instructions (Addendum)
Do not use acetaminophen and acetaminophen #2 together.   Separate acetaminophen and acetaminophen #2 by at least 4hours.   Use acetaminophen #2 only as needed for severe pain.  Use acetaminophen only for mild pain.  Your prescription for xanax, voltaren gel and xyzal have been sent to your pharmacy ([facility]).  Bring all medications to next office visit.

## 2016-12-16 NOTE — Telephone Encounter (Signed)
Xanax faxed to [place]

## 2016-12-18 ENCOUNTER — Encounter (INDEPENDENT_AMBULATORY_CARE_PROVIDER_SITE_OTHER): Payer: Self-pay | Admitting: Orthopaedic Surgery

## 2016-12-18 ENCOUNTER — Ambulatory Visit (INDEPENDENT_AMBULATORY_CARE_PROVIDER_SITE_OTHER): Payer: Medicare Other | Admitting: Orthopaedic Surgery

## 2016-12-18 VITALS — Ht 62.0 in | Wt 122.0 lb

## 2016-12-18 DIAGNOSIS — M79641 Pain in right hand: Secondary | ICD-10-CM | POA: Diagnosis not present

## 2016-12-18 NOTE — Progress Notes (Signed)
Office Visit Note   Patient: Heather [name]           Date of Birth: [birth year]           MRN: [id number] Visit Date: 12/18/2016              Requested by: Heather Buffy, NP [address] [place],  [address] PCP: Heather Lacy, NP   Assessment & Plan: Visit Diagnoses: Osteoarthritis base of right thumb. Heather [name] is a number of medical issues which appear to be chronic and unchanged. Significant degenerative change in the lumbar spine but really no radicular discomfort. Taken this point she needs to see Heather [name].   Plan: Left right thumb Freedom splint and follow up as needed. Consider cortisone injection any time in the future.   Follow-Up Instructions: No Follow-up on file.   Orders:  No orders of the defined types were placed in this encounter.  No orders of the defined types were placed in this encounter.     Procedures: No procedures performed   Clinical Data: No additional findings.   Subjective: No chief complaint on file.   Pt presents with Right hand pain and arm pain, denies injury, says she went to the ED and they "tore" her veins. Pt not clear as to what her problem is. She states she has OA and no one will help her.  Heather [name] recently went to the emergency room in December and received an IV as part of her treatment. Thinks something happened with her arm after the insertion of the IV and visits the office today complaining of some pain in her right forearm and hand. She of injury or trauma. She's not had any ecchymosis. She does have carpal tunnel syndrome and notes that's really "not any different  Review of Systems   Objective: Vital Signs: There were no vitals taken for this visit.  Physical Exam  Ortho Exam Examination of the right forearm and elbow reveal no obvious abnormality. Veins were intact there is no evidence of a venous occlusion. No induration or ecchymosis. Full range of motion of the elbow in flexion extension  pronation and supination. Degenerative changes at the base of the thumb with a positive grind test and some local tenderness. There is at least partial subluxation related to the arthritis. Full range of motion of the fingers. Skin intact. Negative Tinel's over the median nerve. No swelling of any of  the fingers. Specialty Comments:  No specialty comments available.  Imaging: No results found.   PMFS History: Patient Active Problem List   Diagnosis Date Noted  . Anxiety about health 05/28/2016  . Abnormal CT scan of lung 07/16/2015  . Abdominal pain 11/30/2014  . Chronic LLQ pain 11/23/2014  . Esophageal dysmotility 10/27/2014  . Cough 10/21/2014  . Other dysphagia 05/09/2014  . Failure to thrive in adult 04/26/2014  . Arthritis of hand, degenerative 04/10/2014  . Other and unspecified hyperlipidemia 04/10/2014  . LPRD (laryngopharyngeal reflux disease) 04/04/2014  . Routine general medical examination at a health care facility 02/21/2014  . Allergic rhinitis 02/21/2014  . Abnormal findings in stool 12/02/2013  . Abdominal pain, left lower quadrant 06/22/2013  . Chronic bronchitis ([place]) 05/30/2013  . Somatization disorder 03/14/2013  . Hx of breast cancer 08/30/2012  . Multiple somatic complaints 06/28/2012  . Rhinitis 05/11/2012  . Edema 06/12/2011  . GERD 03/27/2010  . DIZZINESS 08/31/2009  . Diastolic CHF ([place]) [id number]  . FLANK PAIN, LEFT 03/21/2009  .  WEAKNESS 02/21/2009  . CONSTIPATION 01/18/2009  . FLATULENCE-GAS-BLOATING 01/18/2009  . CARPAL TUNNEL SYNDROME, BILATERAL 10/03/2008  . Cervical spondylosis without myelopathy 03/21/2008  . Diaphragmatic hernia 12/26/2007  . RENAL CYST 12/26/2007  . Anxiety state 09/15/2007  . Essential hypertension 09/15/2007  . VENOUS INSUFFICIENCY 09/15/2007  . Osteoarthritis 09/15/2007  . LOW BACK PAIN, CHRONIC 09/15/2007  . Diverticulosis of colon 05/24/2002   Past Medical History:  Diagnosis Date  . Allergic rhinitis,  cause unspecified 02/21/2014  . Anxiety state, unspecified   . Breast cancer ([place])    NO STICK OR BLOOD PRESSURE CHECKS IN LEFT ARM  . Carpal tunnel syndrome   . Cervical spondylosis   . Colon polyp 12/19/2009   Transverse-polypoid colorectal mucosa  . Constipation   . Degenerative joint disease   . Diastolic dysfunction   . Diverticula of colon   . Gastritis, chronic   . Hiatal hernia   . Hypertension   . Low back pain   . RBBB (right bundle branch block with left anterior fascicular block)   . Renal cyst   . Venous insufficiency     Family History  Problem Relation Age of Onset  . Colon cancer Neg Hx     Past Surgical History:  Procedure Laterality Date  . CARPAL TUNNEL RELEASE    . Left mastectomy     Social History   Occupational History  . Retired Retired   Social History Main Topics  . Smoking status: Never Smoker  . Smokeless tobacco: Never Used  . Alcohol use No  . Drug use: No  . Sexual activity: Not on file

## 2017-01-19 ENCOUNTER — Ambulatory Visit (INDEPENDENT_AMBULATORY_CARE_PROVIDER_SITE_OTHER): Payer: Medicare Other | Admitting: Nurse Practitioner

## 2017-01-19 ENCOUNTER — Encounter: Payer: Self-pay | Admitting: Nurse Practitioner

## 2017-01-19 VITALS — BP 116/68 | HR 78 | Temp 98.1°F | Ht 62.0 in | Wt 119.0 lb

## 2017-01-19 DIAGNOSIS — R600 Localized edema: Secondary | ICD-10-CM | POA: Diagnosis not present

## 2017-01-19 DIAGNOSIS — I10 Essential (primary) hypertension: Secondary | ICD-10-CM | POA: Diagnosis not present

## 2017-01-19 DIAGNOSIS — R1032 Left lower quadrant pain: Secondary | ICD-10-CM | POA: Diagnosis not present

## 2017-01-19 DIAGNOSIS — J310 Chronic rhinitis: Secondary | ICD-10-CM

## 2017-01-19 DIAGNOSIS — I872 Venous insufficiency (chronic) (peripheral): Secondary | ICD-10-CM | POA: Diagnosis not present

## 2017-01-19 DIAGNOSIS — K2100 Gastro-esophageal reflux disease with esophagitis, without bleeding: Secondary | ICD-10-CM

## 2017-01-19 DIAGNOSIS — G8929 Other chronic pain: Secondary | ICD-10-CM

## 2017-01-19 DIAGNOSIS — M545 Low back pain: Secondary | ICD-10-CM | POA: Diagnosis not present

## 2017-01-19 DIAGNOSIS — Z780 Asymptomatic menopausal state: Secondary | ICD-10-CM

## 2017-01-19 DIAGNOSIS — R1084 Generalized abdominal pain: Secondary | ICD-10-CM

## 2017-01-19 MED ORDER — PANTOPRAZOLE SODIUM 40 MG PO TBEC: 40.0000 mg | DELAYED_RELEASE_TABLET | Freq: Two times a day (BID) | ORAL | 2 refills | Status: DC

## 2017-01-19 MED ORDER — LEVOCETIRIZINE DIHYDROCHLORIDE 5 MG PO TABS: 5.0000 mg | ORAL_TABLET | Freq: Every evening | ORAL | 2 refills | Status: DC

## 2017-01-19 MED ORDER — CARVEDILOL 3.125 MG PO TABS: 3.1250 mg | ORAL_TABLET | Freq: Two times a day (BID) | ORAL | 2 refills | Status: DC

## 2017-01-19 MED ORDER — FUROSEMIDE 40 MG PO TABS: 40.0000 mg | ORAL_TABLET | Freq: Every day | ORAL | 2 refills | Status: DC

## 2017-01-19 MED ORDER — ACETAMINOPHEN-CODEINE #2 300-15 MG PO TABS: 1.0000 | ORAL_TABLET | Freq: Four times a day (QID) | ORAL | 0 refills | Status: DC | PRN

## 2017-01-19 NOTE — Progress Notes (Signed)
Subjective:  Patient ID: Heather [name], female    DOB: April 24, 1918  Age: 83 y.o. MRN: [id number]  CC: Osteoarthritis   HPI  denies any acute complaints. Needs medications refilled.  Constipation: Improved with miralax  Anxiety: Improved with use of xanax 0.25 prn. Does not need refill at this time.  Chronic pain: Stable with use of tylenol #3 and Voltaren gel prn.  HTN: Controlled with coreg.  Edema: Stable with furosemide and compression stocking.  Outpatient Medications Prior to Visit  Medication Sig Dispense Refill  . ALPRAZolam (XANAX) 0.25 MG tablet Take 1 tablet (0.25 mg total) by mouth 2 (two) times daily as needed for anxiety. 30 tablet 0  . Cholecalciferol (VITAMIN D3) 1000 UNITS CAPS Take 1,000 Units by mouth daily. For your bones    . diclofenac sodium (VOLTAREN) 1 % GEL Apply 2 g topically 2 (two) times daily as needed. 100 g 1  . feeding supplement, ENSURE ENLIVE, (ENSURE ENLIVE) LIQD Take 237 mLs by mouth 2 (two) times daily between meals. 237 mL 12  . Multiple Vitamins-Minerals (MULTIVITAMINS THER. W/MINERALS) TABS Take 1 tablet by mouth daily.    . polyethylene glycol powder (GLYCOLAX/MIRALAX) powder Take 17 g by mouth daily as needed. 3350 g 1  . acetaminophen (TYLENOL) 500 MG tablet Take 2 tablets (1,000 mg total) by mouth every 6 (six) hours as needed. (Patient taking differently: Take 1,000 mg by mouth every 6 (six) hours as needed for mild pain. ) 30 tablet 1  . carvedilol (COREG) 3.125 MG tablet Take 1 tablet (3.125 mg total) by mouth 2 (two) times daily. 180 tablet 2  . furosemide (LASIX) 40 MG tablet Take 1 tablet (40 mg total) by mouth daily. 30 tablet 3  . levocetirizine (XYZAL) 5 MG tablet Take 1 tablet (5 mg total) by mouth every evening. 30 tablet 3  . pantoprazole (PROTONIX) 40 MG tablet Take 1 tablet (40 mg total) by mouth 2 (two) times daily. 60 tablet 11  . acetaminophen-codeine (TYLENOL #2) 300-15 MG tablet Take 1-2 tablets by mouth every 6  (six) hours as needed for moderate pain. (Patient not taking: Reported on 01/19/2017) 30 tablet 0   No facility-administered medications prior to visit.     ROS Review of Systems  Constitutional: Negative for chills, fever, malaise/fatigue and weight loss.  HENT: Negative for congestion and sore throat.   Respiratory: Negative for cough and shortness of breath.   Cardiovascular: Negative for chest pain and palpitations.  Gastrointestinal: Positive for abdominal pain. Negative for constipation, diarrhea, heartburn, nausea and vomiting.  Genitourinary: Negative.   Musculoskeletal: Positive for back pain, joint pain and myalgias. Negative for falls.  Neurological: Negative for dizziness, sensory change, loss of consciousness and headaches.  Endo/Heme/Allergies: Positive for environmental allergies. Does not bruise/bleed easily.  Psychiatric/Behavioral: Negative for depression, memory loss and suicidal ideas. The patient is not nervous/anxious and does not have insomnia.     Objective:  BP 116/68   Pulse 78   Temp 98.1 F (36.7 C)   Ht 5\' 2"  (1.575 m)   Wt 119 lb (54 kg)   SpO2 98%   BMI 21.77 kg/m   BP Readings from Last 3 Encounters:  01/19/17 116/68  12/16/16 130/66  12/01/16 110/60    Wt Readings from Last 3 Encounters:  01/19/17 119 lb (54 kg)  12/18/16 122 lb (55.3 kg)  12/16/16 122 lb (55.3 kg)    Physical Exam  Constitutional: She is oriented to person, place, and time. No  distress.  Neck: Normal range of motion. Neck supple. No thyromegaly present.  Cardiovascular: Normal rate and regular rhythm.   Murmur heard. Pulmonary/Chest: Effort normal and breath sounds normal.  Abdominal: Soft. Bowel sounds are normal. She exhibits no distension. There is no tenderness.  Musculoskeletal: She exhibits edema.  Lymphadenopathy:    She has no cervical adenopathy.  Neurological: She is alert and oriented to person, place, and time.  Skin: Skin is warm and dry.  Vitals  reviewed.   Lab Results  Component Value Date   WBC 5.2 11/11/2016   HGB 12.6 11/11/2016   HCT 38.2 11/11/2016   PLT 213 11/11/2016   GLUCOSE 97 11/11/2016   CHOL 199 02/02/2014   TRIG 20.0 02/02/2014   HDL 98.70 02/02/2014   LDLCALC 96 02/02/2014   ALT 19 11/11/2016   AST 33 11/11/2016   NA 141 11/11/2016   K 4.1 11/11/2016   CL 104 11/11/2016   CREATININE 1.02 (H) 11/11/2016   BUN 26 (H) 11/11/2016   CO2 29 11/11/2016   TSH 2.67 11/30/2014    Ct Abdomen Pelvis Wo Contrast  Result Date: 11/12/2016 CLINICAL DATA:  Left flank and groin pain EXAM: CT ABDOMEN AND PELVIS WITHOUT CONTRAST TECHNIQUE: Multidetector CT imaging of the abdomen and pelvis was performed following the standard protocol without IV contrast. COMPARISON:  CT abdomen pelvis 05/02/2016 FINDINGS: The lack of intravenous and oral contrast somewhat limits the study, as the bowel is closely space throughout the abdomen and there is poor contrast resolution between the bowel and the retroperitoneal structures. Lower chest: No pulmonary nodules or pleural effusion. No visible pericardial effusion. Hepatobiliary: Normal noncontrast appearance of the liver. No visible biliary dilatation. Normal gallbladder. Pancreas: Normal noncontrast appearance of the pancreas. No peripancreatic fluid collection. Spleen: Normal. Adrenal glands: Normal. Urinary Tract: --Right kidney: No hydronephrosis or perinephric stranding. No nephrolithiasis. No obstructing ureteral stones. --Left kidney: No hydronephrosis or perinephric stranding. No nephrolithiasis. No obstructing ureteral stones. --Urinary bladder: Unremarkable. Stomach/Bowel: No dilated loops of bowel. No evidence of colonic or enteric inflammation. No fluid collection within the abdomen. Moderate amount of stool within the colon. Vascular/Lymphatic: There is atherosclerotic calcification of the non aneurysmal abdominal aorta. No visible abdominal or pelvic lymphadenopathy. Reproductive:  No focal abnormality identified. Musculoskeletal. Multilevel lumbar osteophytosis and facet arthrosis. No bony spinal canal stenosis. IMPRESSION: 1. No obstructive uropathy. 2. Assessment of the bowel is limited by the lack of p.o. and intravenous contrast, causing poor contrast resolution between the bowel and adjacent retroperitoneal structures. Within that limitation, there is a moderate amount of stool within the colon but no acute abnormality of the bowel. 3. Aortic atherosclerosis. Electronically Signed   By: Ulyses Jarred M.D.   On: 11/12/2016 04:19    Assessment & Plan:   Cherita was seen today for osteoarthritis.  Diagnoses and all orders for this visit:  Essential hypertension -     carvedilol (COREG) 3.125 MG tablet; Take 1 tablet (3.125 mg total) by mouth 2 (two) times daily.  Chronic bilateral low back pain without sciatica -     acetaminophen-codeine (TYLENOL #2) 300-15 MG tablet; Take 1-2 tablets by mouth every 6 (six) hours as needed for moderate pain.  Chronic LLQ pain -     acetaminophen-codeine (TYLENOL #2) 300-15 MG tablet; Take 1-2 tablets by mouth every 6 (six) hours as needed for moderate pain.  Generalized abdominal pain -     pantoprazole (PROTONIX) 40 MG tablet; Take 1 tablet (40 mg total) by mouth  2 (two) times daily.  Gastroesophageal reflux disease with esophagitis -     pantoprazole (PROTONIX) 40 MG tablet; Take 1 tablet (40 mg total) by mouth 2 (two) times daily.  Venous (peripheral) insufficiency  Localized edema -     furosemide (LASIX) 40 MG tablet; Take 1 tablet (40 mg total) by mouth daily.  Rhinitis, unspecified chronicity, unspecified type -     levocetirizine (XYZAL) 5 MG tablet; Take 1 tablet (5 mg total) by mouth every evening.  Asymptomatic postmenopausal estrogen deficiency -     DG Bone Density; Future   I have discontinued Ms. Ware's acetaminophen. I am also having her maintain her Vitamin D3, multivitamins ther. w/minerals, feeding  supplement (ENSURE ENLIVE), polyethylene glycol powder, diclofenac sodium, ALPRAZolam, acetaminophen-codeine, carvedilol, furosemide, levocetirizine, and pantoprazole.  Meds ordered this encounter  Medications  . acetaminophen-codeine (TYLENOL #2) 300-15 MG tablet    Sig: Take 1-2 tablets by mouth every 6 (six) hours as needed for moderate pain.    Dispense:  30 tablet    Refill:  0    Order Specific Question:   Supervising Provider    Answer:   Cassandria Anger [[id number]]  . carvedilol (COREG) 3.125 MG tablet    Sig: Take 1 tablet (3.125 mg total) by mouth 2 (two) times daily.    Dispense:  180 tablet    Refill:  2    Order Specific Question:   Supervising Provider    Answer:   Cassandria Anger [[id number]]  . furosemide (LASIX) 40 MG tablet    Sig: Take 1 tablet (40 mg total) by mouth daily.    Dispense:  60 tablet    Refill:  2    Order Specific Question:   Supervising Provider    Answer:   Cassandria Anger [[id number]]  . levocetirizine (XYZAL) 5 MG tablet    Sig: Take 1 tablet (5 mg total) by mouth every evening.    Dispense:  60 tablet    Refill:  2    Order Specific Question:   Supervising Provider    Answer:   Cassandria Anger [[id number]]  . pantoprazole (PROTONIX) 40 MG tablet    Sig: Take 1 tablet (40 mg total) by mouth 2 (two) times daily.    Dispense:  180 tablet    Refill:  2    Order Specific Question:   Supervising Provider    Answer:   Cassandria Anger [[id number]]    Follow-up: Return in about 6 months (around 07/22/2017) for HTN, pain, anxiety and edema (repeat BMP, TSH, vitamin D).  Wilfred Lacy, NP

## 2017-01-19 NOTE — Patient Instructions (Signed)
Tylenol #3 prescription given to patient with AVS.

## 2017-01-28 ENCOUNTER — Encounter: Payer: Self-pay | Admitting: Cardiovascular Disease

## 2017-02-02 ENCOUNTER — Telehealth: Payer: Self-pay | Admitting: Nurse Practitioner

## 2017-02-02 ENCOUNTER — Emergency Department (HOSPITAL_COMMUNITY)
Admission: EM | Admit: 2017-02-02 | Discharge: 2017-02-03 | Disposition: A | Discharge status: Home / Self Care | Payer: Medicare Other | Attending: Emergency Medicine | Admitting: Emergency Medicine

## 2017-02-02 ENCOUNTER — Encounter (HOSPITAL_COMMUNITY): Payer: Self-pay | Admitting: Emergency Medicine

## 2017-02-02 DIAGNOSIS — Z853 Personal history of malignant neoplasm of breast: Secondary | ICD-10-CM | POA: Diagnosis not present

## 2017-02-02 DIAGNOSIS — I10 Essential (primary) hypertension: Secondary | ICD-10-CM | POA: Insufficient documentation

## 2017-02-02 DIAGNOSIS — M7989 Other specified soft tissue disorders: Secondary | ICD-10-CM | POA: Diagnosis not present

## 2017-02-02 DIAGNOSIS — Z79899 Other long term (current) drug therapy: Secondary | ICD-10-CM | POA: Insufficient documentation

## 2017-02-02 LAB — BASIC METABOLIC PANEL
Anion gap: 10 (ref 5–15)
BUN: 30 mg/dL — AB (ref 6–20)
CALCIUM: 9.5 mg/dL (ref 8.9–10.3)
CO2: 27 mmol/L (ref 22–32)
CREATININE: 1.14 mg/dL — AB (ref 0.44–1.00)
Chloride: 106 mmol/L (ref 101–111)
GFR calc non Af Amer: 39 mL/min — ABNORMAL LOW (ref >60)
GFR, EST AFRICAN AMERICAN: 46 mL/min — AB (ref >60)
Glucose, Bld: 106 mg/dL — ABNORMAL HIGH (ref 65–99)
Potassium: 3.9 mmol/L (ref 3.5–5.1)
Sodium: 143 mmol/L (ref 135–145)

## 2017-02-02 LAB — CBC
HCT: 39.5 % (ref 36.0–46.0)
Hemoglobin: 13.4 g/dL (ref 12.0–15.0)
MCH: 31.3 pg (ref 26.0–34.0)
MCHC: 33.9 g/dL (ref 30.0–36.0)
MCV: 92.3 fL (ref 78.0–100.0)
PLATELETS: 229 10*3/uL (ref 150–400)
RBC: 4.28 MIL/uL (ref 3.87–5.11)
RDW: 13.5 % (ref 11.5–15.5)
WBC: 6.1 10*3/uL (ref 4.0–10.5)

## 2017-02-02 NOTE — ED Provider Notes (Signed)
[facility] DEPT Provider Note   CSN: [id number] Arrival date & time: 02/02/17  2034  By signing my name below, I, Heather [name], attest that this documentation has been prepared under the direction and in the presence of Varney Biles, MD. Electronically Signed: Reinaldo Meeker, Scribe. 02/02/2017. 12:21 AM.  History   Chief Complaint Chief Complaint  Patient presents with  . Leg Swelling   The history is provided by the patient and medical records. No language interpreter was used.    HPI Comments:  Heather [name] is a 83 y.o. female with a PMHx of Gastritis, HTN, CA, Diverticulitis, Venous Insufficiency, and DJD, who presents to the Emergency Department complaining of gradually worsening, bilateral leg swelling onset one month ago. She states this gradually worsening leg swelling with leg pain has been extremely aggravating over the past month. She states her PCP has not been able to help her with this problem and her right leg hurts worse than her left. Pt has associated symptoms of generalized myalgias stating "I feel like my body is falling apart" along with urinary frequency d/t her Lasix 40mg  and unexpected mild weight loss. She tried compression leg sleeves and Lasix BID at home with minimal relief of her leg swelling. Ambulation exacerbates her pain. Pt denies CP, SOB, dysuria, and any other complaints at this time.   Past Medical History:  Diagnosis Date  . Allergic rhinitis, cause unspecified 02/21/2014  . Anxiety state, unspecified   . Breast cancer ([place])    NO STICK OR BLOOD PRESSURE CHECKS IN LEFT ARM  . Carpal tunnel syndrome   . Cervical spondylosis   . Colon polyp 12/19/2009   Transverse-polypoid colorectal mucosa  . Constipation   . Degenerative joint disease   . Diastolic dysfunction   . Diverticula of colon   . Gastritis, chronic   . Hiatal hernia   . Hypertension   . Low back pain   . RBBB (right bundle branch block with left anterior fascicular  block)   . Renal cyst   . Venous insufficiency     Patient Active Problem List   Diagnosis Date Noted  . Anxiety about health 05/28/2016  . Abnormal CT scan of lung 07/16/2015  . Abdominal pain 11/30/2014  . Chronic LLQ pain 11/23/2014  . Esophageal dysmotility 10/27/2014  . Cough 10/21/2014  . Other dysphagia 05/09/2014  . Failure to thrive in adult 04/26/2014  . Arthritis of hand, degenerative 04/10/2014  . Other and unspecified hyperlipidemia 04/10/2014  . LPRD (laryngopharyngeal reflux disease) 04/04/2014  . Routine general medical examination at a health care facility 02/21/2014  . Allergic rhinitis 02/21/2014  . Abnormal findings in stool 12/02/2013  . Abdominal pain, left lower quadrant 06/22/2013  . Chronic bronchitis ([place]) 05/30/2013  . Somatization disorder 03/14/2013  . Hx of breast cancer 08/30/2012  . Multiple somatic complaints 06/28/2012  . Rhinitis 05/11/2012  . Edema 06/12/2011  . GERD 03/27/2010  . DIZZINESS 08/31/2009  . Diastolic CHF ([place]) 19/37/9024  . FLANK PAIN, LEFT 03/21/2009  . WEAKNESS 02/21/2009  . CONSTIPATION 01/18/2009  . FLATULENCE-GAS-BLOATING 01/18/2009  . CARPAL TUNNEL SYNDROME, BILATERAL 10/03/2008  . Cervical spondylosis without myelopathy 03/21/2008  . Diaphragmatic hernia 12/26/2007  . RENAL CYST 12/26/2007  . Anxiety state 09/15/2007  . Essential hypertension 09/15/2007  . Venous (peripheral) insufficiency 09/15/2007  . Osteoarthritis 09/15/2007  . LOW BACK PAIN, CHRONIC 09/15/2007  . Diverticulosis of colon 05/24/2002    Past Surgical History:  Procedure Laterality Date  .  CARPAL TUNNEL RELEASE    . Left mastectomy      OB History    No data available       Home Medications    Prior to Admission medications   Medication Sig Start Date End Date Taking? Authorizing Provider  acetaminophen-codeine (TYLENOL #2) 300-15 MG tablet Take 1-2 tablets by mouth every 6 (six) hours as needed for moderate pain. 01/19/17    Flossie Buffy, NP  ALPRAZolam Duanne Moron) 0.25 MG tablet Take 1 tablet (0.25 mg total) by mouth 2 (two) times daily as needed for anxiety. 12/16/16   Flossie Buffy, NP  carvedilol (COREG) 3.125 MG tablet Take 1 tablet (3.125 mg total) by mouth 2 (two) times daily. 01/19/17   Flossie Buffy, NP  Cholecalciferol (VITAMIN D3) 1000 UNITS CAPS Take 1,000 Units by mouth daily. For your bones    Historical Provider, MD  diclofenac sodium (VOLTAREN) 1 % GEL Apply 2 g topically 2 (two) times daily as needed. 12/16/16   Flossie Buffy, NP  feeding supplement, ENSURE ENLIVE, (ENSURE ENLIVE) LIQD Take 237 mLs by mouth 2 (two) times daily between meals. 09/30/16   Flossie Buffy, NP  furosemide (LASIX) 40 MG tablet Take 1 tablet (40 mg total) by mouth daily. 01/19/17   Flossie Buffy, NP  levocetirizine (XYZAL) 5 MG tablet Take 1 tablet (5 mg total) by mouth every evening. 01/19/17   Flossie Buffy, NP  Multiple Vitamins-Minerals (MULTIVITAMINS THER. W/MINERALS) TABS Take 1 tablet by mouth daily.    Historical Provider, MD  pantoprazole (PROTONIX) 40 MG tablet Take 1 tablet (40 mg total) by mouth 2 (two) times daily. 01/19/17   Flossie Buffy, NP  polyethylene glycol powder (GLYCOLAX/MIRALAX) powder Take 17 g by mouth daily as needed. 11/26/16   Flossie Buffy, NP    Family History Family History  Problem Relation Age of Onset  . Colon cancer Neg Hx     Social History Social History  Substance Use Topics  . Smoking status: Never Smoker  . Smokeless tobacco: Never Used  . Alcohol use No     Allergies   Fentanyl   Review of Systems Review of Systems  Constitutional: Positive for unexpected weight change.  Respiratory: Negative for shortness of breath.   Cardiovascular: Positive for leg swelling. Negative for chest pain.  Genitourinary: Negative for dysuria.  Musculoskeletal: Positive for myalgias.   10 Systems reviewed and are negative for acute change except as noted  in the HPI.   Physical Exam Updated Vital Signs BP (!) 141/72 (BP Location: Right Arm)   Pulse 81   Temp 98 F (36.7 C) (Oral)   Resp 18   Ht 5\' 2"  (1.575 m)   Wt 119 lb (54 kg)   SpO2 96%   BMI 21.77 kg/m   Physical Exam  Constitutional: She is oriented to person, place, and time. She appears well-developed and well-nourished.  HENT:  Head: Normocephalic.  Eyes: Conjunctivae are normal.  Cardiovascular: Normal rate and regular rhythm.   Pulmonary/Chest: Effort normal and breath sounds normal. She has no wheezes. She has no rales.  Lungs clear.  Abdominal: She exhibits no distension.  Generalized discomfort over the abdomen.  Musculoskeletal: Normal range of motion.  Unilateral right lower extremity swelling with calf tenderness. Pt has trace pitting edema bilaterally.   Neurological: She is alert and oriented to person, place, and time.  Skin: Skin is warm and dry.  Psychiatric: She has a normal mood and  affect.  Nursing note and vitals reviewed.    ED Treatments / Results  DIAGNOSTIC STUDIES:  Oxygen Saturation is 95% on RA, adequate by my interpretation.    COORDINATION OF CARE:  12:19 AM Discussed treatment plan with pt at bedside including blood work and an appointment for [place] of lower extremities and pt agreed to plan.  Labs (all labs ordered are listed, but only abnormal results are displayed) Labs Reviewed  BASIC METABOLIC PANEL - Abnormal; Notable for the following:       Result Value   Glucose, Bld 106 (*)    BUN 30 (*)    Creatinine, Ser 1.14 (*)    GFR calc non Af Amer 39 (*)    GFR calc Af Amer 46 (*)    All other components within normal limits  CBC    EKG  EKG Interpretation None       Radiology No results found.  Procedures Procedures (including critical care time)  Medications Ordered in ED Medications - No data to display   Initial Impression / Assessment and Plan / ED Course  I have reviewed the triage vital signs and the  nursing notes.  Pertinent labs & imaging results that were available during my care of the patient were reviewed by me and considered in my medical decision making (see chart for details).    Pt comes in with cc of leg swelling. Pt has unilateral RLE swelling. There is no significant edema. With her leg pain and swelling, it is prudent that we get US duplex to r/o DVT. Outpatient study ordered, pt informed me that she will have a ride to get to the appt.   Final Clinical Impressions(s) / ED Diagnoses   Final diagnoses:  Right leg swelling    New Prescriptions New Prescriptions   No medications on file    I personally performed the services described in this documentation, which was scribed in my presence. The recorded information has been reviewed and is accurate.     Varney Biles, MD 02/03/17 [phone]

## 2017-02-02 NOTE — Progress Notes (Signed)
Cardiology Office Note    Date:  02/06/2017   ID:  Heather [name], DOB 13-May-1918, MRN [id number]  PCP:  Wilfred Lacy, NP  Cardiologist:  Dr. Johnsie Cancel     History of Present Illness: Heather [name] is a 83 y.o. female w/ a hx of chronic diastolic CHF, HTN, atypical CP s/p normal myoview in 2007 and 2009, breast CA s/p L mastectomy, chronic phlegm/cough, chronic edema, anxiety, depression, diverticulosis/gastritis/functional abdominal pain who is added onto my [facility] clinic schedule for evaluation of labile BPs.   Last seen by me  in 07/2014.  Review of chart reveals that she constantly complains of multiple somatic complaints and always feels "terrible" w/ mult GI issues and respiratory issues with chronic cough and phlem. She is unhappy w/ PCP and has switched multiple times and see multiple providers concurrently. She sees specialists including pulmonary, GI, ortho and renal. She has multiple abnormalities noted on imaging that are being followed by various specialties. Please see Dr. [name] note from 07/16/15 to see a full list of her problems. He states that she is doing "remarklabley well for a 83 yo". He urges her to take her Xanax more frequently.  BP can be labile Norvasc has caused edema in past.  She seems to have more dementia   Studies:  - Echo (2010): EF 55-60%, mild AR.  - Nuclear (2009):  Normal per Dr. Johnsie Cancel note (dont see report)    Recent Labs/Images:   Recent Labs  11/11/16 1842 02/02/17 2053  NA 141 143  K 4.1 3.9  BUN 26* 30*  CREATININE 1.02* 1.14*  ALT 19  --   HGB 12.6 13.4     Dg Lumbar Spine 2-3 Views  08/21/2015   CLINICAL DATA:  Back pain radiating down left leg.  EXAM: LUMBAR SPINE - 2-3 VIEW  COMPARISON:  None.  FINDINGS: Paraspinal soft tissues normal. Degenerative changes lumbar spine and both hips. Mild scoliosis. No acute bony abnormality identified.  IMPRESSION: Severe multilevel degenerative changes lumbar spine. Mild scoliosis.    Electronically Signed   By: Marcello Moores  Register   On: 08/21/2015 15:43     Wt Readings from Last 3 Encounters:  02/03/17 119 lb (54 kg)  01/19/17 119 lb (54 kg)  12/18/16 122 lb (55.3 kg)     Past Medical History:  Diagnosis Date  . Allergic rhinitis, cause unspecified 02/21/2014  . Anxiety state, unspecified   . Breast cancer ([place])    NO STICK OR BLOOD PRESSURE CHECKS IN LEFT ARM  . Carpal tunnel syndrome   . Cervical spondylosis   . Colon polyp 12/19/2009   Transverse-polypoid colorectal mucosa  . Constipation   . Degenerative joint disease   . Diastolic dysfunction   . Diverticula of colon   . Gastritis, chronic   . Hiatal hernia   . Hypertension   . Low back pain   . RBBB (right bundle branch block with left anterior fascicular block)   . Renal cyst   . Venous insufficiency     Current Outpatient Prescriptions  Medication Sig Dispense Refill  . acetaminophen-codeine (TYLENOL #2) 300-15 MG tablet Take 1-2 tablets by mouth every 6 (six) hours as needed for moderate pain. 30 tablet 0  . ALPRAZolam (XANAX) 0.25 MG tablet Take 1 tablet (0.25 mg total) by mouth 2 (two) times daily as needed for anxiety. 30 tablet 0  . carvedilol (COREG) 3.125 MG tablet Take 1 tablet (3.125 mg total) by mouth 2 (two) times daily. Roland  tablet 2  . Cholecalciferol (VITAMIN D3) 1000 UNITS CAPS Take 1,000 Units by mouth daily. For your bones    . feeding supplement, ENSURE ENLIVE, (ENSURE ENLIVE) LIQD Take 237 mLs by mouth 2 (two) times daily between meals. 237 mL 12  . furosemide (LASIX) 40 MG tablet Take 1 tablet (40 mg total) by mouth daily. 60 tablet 2  . levocetirizine (XYZAL) 5 MG tablet Take 1 tablet (5 mg total) by mouth every evening. 60 tablet 2  . Multiple Vitamins-Minerals (MULTIVITAMINS THER. W/MINERALS) TABS Take 1 tablet by mouth daily.    . pantoprazole (PROTONIX) 40 MG tablet Take 1 tablet (40 mg total) by mouth 2 (two) times daily. 180 tablet 2  . polyethylene glycol powder  (GLYCOLAX/MIRALAX) powder Take 17 g by mouth daily as needed. 3350 g 1   No current facility-administered medications for this visit.      Allergies:   Fentanyl   Social History:  The patient  reports that she has never smoked. She has never used smokeless tobacco. She reports that she does not drink alcohol or use drugs.   Family History:  The patient's family history is not on file.   ROS:  Please see the history of present illness.   All other systems reviewed and negative.    PHYSICAL EXAM: VS:  There were no vitals taken for this visit. Affect appropriate Elderly female HEENT: normal Neck supple with no adenopathy JVP normal no bruits no thyromegaly Lungs clear with no wheezing and good diaphragmatic motion Post left mastectomy Heart:  S1/S2 no murmur, no rub, gallop or click PMI normal Abdomen: benighn, BS positve, no tenderness, no AAA no bruit.  No HSM or HJR Distal pulses intact with no bruits No edema Neuro non-focal Skin warm and dry No muscular weakness   EKG:  HR 74 NSR RBBB  02/06/17  SR rate 70 RBBB LAFB      ASSESSMENT AND PLAN:  Heather [name] is a 83 y.o. [place] Kitchen female w/ a hx of chronic diastolic CHF, HTN, atypical CP s/p normal myoview in 2007 and 2009, breast CA s/p L mastectomy, chronic phlegm/cough, chronic edema, anxiety, depression, diverticulosis/gastritis/functional abdominal pain    HTN- well controlled despite her call reporting "labile pressures."  -- Continue Coreg 3.125mg  BID. She is no longer taking amlodipine -- Continue low sodium Dash type diet.    LE edema- chronic. Felt to be due to venous insufficiency  -- Continue Lasix 40mg  po PRN as needed for swelling  Diastolic Dysfunction:  euvolemic continue current meds given age no need for f/u echo   RBBB:  Chronic no bradycardia or high grade AV block yearly ECG in order   Dementia:  Discussed with son she probably should not be living on her own at this point Memory seems worse    Disposition:   FU with me  in 1 year.    Jenkins Rouge

## 2017-02-02 NOTE — ED Triage Notes (Addendum)
Pt c/o bilateral leg swelling; right leg swelling is worse; pt states that both legs are hurting; pt takes lasix; reports pain to both legs as well; no redness or warmth noted; ambulatory with walker

## 2017-02-02 NOTE — Telephone Encounter (Signed)
Pt called stated her leg still swelling and it is not better since last ov. Offer an appt with Baldo Ash tomorrow but pt stated she can not come in, advise pt if it is getting really bad she need to go to ER/urgent care--also transfer pt to speak to the nurse line. Please advise.

## 2017-02-03 ENCOUNTER — Ambulatory Visit (HOSPITAL_BASED_OUTPATIENT_CLINIC_OR_DEPARTMENT_OTHER)
Admission: RE | Admit: 2017-02-03 | Discharge: 2017-02-03 | Disposition: A | Discharge status: Home / Self Care | Payer: Medicare Other | Source: Ambulatory Visit | Attending: Emergency Medicine | Admitting: Emergency Medicine

## 2017-02-03 DIAGNOSIS — M7989 Other specified soft tissue disorders: Secondary | ICD-10-CM

## 2017-02-03 NOTE — Discharge Instructions (Signed)
I have ordered an ultrasound for the leg. You will receive a call from our hospital to set up an appointment. We want to rule out a blood clot in the leg. If the ultrasound is normal, your primary care doctor will need to be help you with the leg swelling.

## 2017-02-03 NOTE — Progress Notes (Signed)
*  Preliminary Results* Right lower extremity venous duplex completed. Right lower extremity is negative for deep vein thrombosis. There is no evidence of right Baker's cyst.  02/03/2017 8:47 AM  Maudry Mayhew, BS, RVT, RDCS, RDMS

## 2017-02-03 NOTE — Telephone Encounter (Signed)
She was evaluated in ED yesterday. Going for venous doppler today.

## 2017-02-06 ENCOUNTER — Encounter: Payer: Self-pay | Admitting: Cardiovascular Disease

## 2017-02-06 ENCOUNTER — Ambulatory Visit (INDEPENDENT_AMBULATORY_CARE_PROVIDER_SITE_OTHER): Payer: Medicare Other | Admitting: Cardiovascular Disease

## 2017-02-06 VITALS — BP 112/60 | HR 97 | Ht 62.0 in | Wt 127.4 lb

## 2017-02-06 DIAGNOSIS — I10 Essential (primary) hypertension: Secondary | ICD-10-CM

## 2017-02-06 DIAGNOSIS — I5032 Chronic diastolic (congestive) heart failure: Secondary | ICD-10-CM | POA: Diagnosis not present

## 2017-02-06 NOTE — Patient Instructions (Addendum)

## 2017-02-20 DIAGNOSIS — Z1231 Encounter for screening mammogram for malignant neoplasm of breast: Secondary | ICD-10-CM | POA: Diagnosis not present

## 2017-02-20 LAB — HM MAMMOGRAPHY

## 2017-03-03 ENCOUNTER — Encounter: Payer: Self-pay | Admitting: Nurse Practitioner

## 2017-03-03 ENCOUNTER — Telehealth: Payer: Self-pay | Admitting: Nurse Practitioner

## 2017-03-03 NOTE — Telephone Encounter (Signed)
Pt would like o

## 2017-03-03 NOTE — Progress Notes (Signed)
Abstracted result and sent to scan  

## 2017-03-05 DIAGNOSIS — M85852 Other specified disorders of bone density and structure, left thigh: Secondary | ICD-10-CM | POA: Diagnosis not present

## 2017-03-05 DIAGNOSIS — M810 Age-related osteoporosis without current pathological fracture: Secondary | ICD-10-CM | POA: Diagnosis not present

## 2017-03-05 LAB — HM DEXA SCAN

## 2017-03-10 ENCOUNTER — Telehealth: Payer: Self-pay | Admitting: Nurse Practitioner

## 2017-03-10 DIAGNOSIS — G8929 Other chronic pain: Secondary | ICD-10-CM

## 2017-03-10 DIAGNOSIS — R1032 Left lower quadrant pain: Secondary | ICD-10-CM

## 2017-03-10 DIAGNOSIS — F411 Generalized anxiety disorder: Secondary | ICD-10-CM

## 2017-03-10 DIAGNOSIS — M545 Low back pain: Secondary | ICD-10-CM

## 2017-03-10 MED ORDER — ACETAMINOPHEN-CODEINE #2 300-15 MG PO TABS: 1.0000 | ORAL_TABLET | Freq: Four times a day (QID) | ORAL | 0 refills | Status: DC | PRN

## 2017-03-10 MED ORDER — ALPRAZOLAM 0.25 MG PO TABS: 0.2500 mg | ORAL_TABLET | Freq: Every evening | ORAL | 0 refills | Status: DC | PRN

## 2017-03-10 NOTE — Telephone Encounter (Signed)
Received refill req from [facility] on [address] for acetaminophen-codeine (TYLENOL #2) 300-15   Please advise.

## 2017-03-10 NOTE — Telephone Encounter (Signed)
Prescription ready to be faxed

## 2017-03-10 NOTE — Telephone Encounter (Signed)
Pt also request refill for Alprazolam 0.25mg . Please advise

## 2017-03-10 NOTE — Telephone Encounter (Signed)
rx sent

## 2017-03-12 ENCOUNTER — Telehealth: Payer: Self-pay | Admitting: Nurse Practitioner

## 2017-03-12 NOTE — Telephone Encounter (Signed)
Received record from [facility] for bone density, place on [facility]'s desk to review. Will call pt once [facility] response.

## 2017-03-12 NOTE — Telephone Encounter (Signed)
Pt would like results from Dexa done at [facility] imaging.

## 2017-03-13 ENCOUNTER — Ambulatory Visit (INDEPENDENT_AMBULATORY_CARE_PROVIDER_SITE_OTHER)
Admission: RE | Admit: 2017-03-13 | Discharge: 2017-03-13 | Disposition: A | Discharge status: Home / Self Care | Payer: Medicare Other | Source: Ambulatory Visit | Attending: Nurse Practitioner | Admitting: Nurse Practitioner

## 2017-03-13 ENCOUNTER — Ambulatory Visit (INDEPENDENT_AMBULATORY_CARE_PROVIDER_SITE_OTHER): Payer: Medicare Other | Admitting: Nurse Practitioner

## 2017-03-13 ENCOUNTER — Encounter: Payer: Self-pay | Admitting: Nurse Practitioner

## 2017-03-13 VITALS — BP 104/60 | HR 73 | Temp 98.3°F | Ht 62.0 in | Wt 121.0 lb

## 2017-03-13 DIAGNOSIS — R058 Other specified cough: Secondary | ICD-10-CM

## 2017-03-13 DIAGNOSIS — I872 Venous insufficiency (chronic) (peripheral): Secondary | ICD-10-CM

## 2017-03-13 DIAGNOSIS — R05 Cough: Secondary | ICD-10-CM

## 2017-03-13 DIAGNOSIS — I5033 Acute on chronic diastolic (congestive) heart failure: Secondary | ICD-10-CM | POA: Diagnosis not present

## 2017-03-13 DIAGNOSIS — K219 Gastro-esophageal reflux disease without esophagitis: Secondary | ICD-10-CM

## 2017-03-13 MED ORDER — SPIRONOLACTONE 25 MG PO TABS: 25.0000 mg | ORAL_TABLET | Freq: Every day | ORAL | 3 refills | Status: DC

## 2017-03-13 MED ORDER — FUROSEMIDE 20 MG PO TABS: 20.0000 mg | ORAL_TABLET | Freq: Every day | ORAL | 3 refills | Status: DC

## 2017-03-13 NOTE — Progress Notes (Signed)
Subjective:  Patient ID: Heather [name], female    DOB: Jul 30, 1918  Age: 83 y.o. MRN: [id number]  CC: Leg Pain (both legs pain/ went to [facility]--no blood clot--req refill for protonix & other med cant remeber---CHF question?)   Cough  This is a new problem. The current episode started 1 to 4 weeks ago. The problem has been unchanged. The cough is productive of sputum. Pertinent negatives include no chest pain, chills, fever, hemoptysis, nasal congestion, postnasal drip, rhinorrhea, shortness of breath or wheezing. The symptoms are aggravated by lying down. She has tried nothing for the symptoms. Her past medical history is significant for environmental allergies.   Edema: Presents with worsening LE edema with tenderness. She was evaluated in ED 02/02/2017. Venous doppler done: negative for DVT. Evaluated by cardiologist 02/06/17: no change in medications.  Outpatient Medications Prior to Visit  Medication Sig Dispense Refill  . acetaminophen-codeine (TYLENOL #2) 300-15 MG tablet Take 1-2 tablets by mouth every 6 (six) hours as needed for moderate pain. 30 tablet 0  . ALPRAZolam (XANAX) 0.25 MG tablet Take 1 tablet (0.25 mg total) by mouth at bedtime as needed for anxiety or sleep. 30 tablet 0  . carvedilol (COREG) 3.125 MG tablet Take 1 tablet (3.125 mg total) by mouth 2 (two) times daily. 180 tablet 2  . Cholecalciferol (VITAMIN D3) 1000 UNITS CAPS Take 1,000 Units by mouth daily. For your bones    . feeding supplement, ENSURE ENLIVE, (ENSURE ENLIVE) LIQD Take 237 mLs by mouth 2 (two) times daily between meals. 237 mL 12  . levocetirizine (XYZAL) 5 MG tablet Take 1 tablet (5 mg total) by mouth every evening. 60 tablet 2  . Multiple Vitamins-Minerals (MULTIVITAMINS THER. W/MINERALS) TABS Take 1 tablet by mouth daily.    . pantoprazole (PROTONIX) 40 MG tablet Take 1 tablet (40 mg total) by mouth 2 (two) times daily. 180 tablet 2  . polyethylene glycol powder (GLYCOLAX/MIRALAX) powder Take 17  g by mouth daily as needed. 3350 g 1  . furosemide (LASIX) 40 MG tablet Take 1 tablet (40 mg total) by mouth daily. 60 tablet 2   No facility-administered medications prior to visit.     ROS See HPI  Objective:  BP 104/60   Pulse 73   Temp 98.3 F (36.8 C)   Ht 5\' 2"  (1.575 m)   Wt 121 lb (54.9 kg)   SpO2 98%   BMI 22.13 kg/m   BP Readings from Last 3 Encounters:  03/13/17 104/60  02/06/17 112/60  02/03/17 (!) 141/72    Wt Readings from Last 3 Encounters:  03/13/17 121 lb (54.9 kg)  02/06/17 127 lb 6.4 oz (57.8 kg)  02/03/17 119 lb (54 kg)    Physical Exam  Constitutional: She is oriented to person, place, and time. No distress.  Cardiovascular: Normal rate and regular rhythm.   Murmur heard. Pulmonary/Chest: Effort normal. No respiratory distress.  Abdominal: Soft. Bowel sounds are normal. She exhibits no distension. There is no tenderness.  Musculoskeletal: She exhibits edema. She exhibits no tenderness.  Neurological: She is alert and oriented to person, place, and time.  Skin: Skin is warm and dry. No erythema.  Vitals reviewed.   Lab Results  Component Value Date   WBC 6.1 02/02/2017   HGB 13.4 02/02/2017   HCT 39.5 02/02/2017   PLT 229 02/02/2017   GLUCOSE 106 (H) 02/02/2017   CHOL 199 02/02/2014   TRIG 20.0 02/02/2014   HDL 98.70 02/02/2014   LDLCALC 96  02/02/2014   ALT 19 11/11/2016   AST 33 11/11/2016   NA 143 02/02/2017   K 3.9 02/02/2017   CL 106 02/02/2017   CREATININE 1.14 (H) 02/02/2017   BUN 30 (H) 02/02/2017   CO2 27 02/02/2017   TSH 2.67 11/30/2014    No results found.  Assessment & Plan:   Andy was seen today for leg pain.  Diagnoses and all orders for this visit:  Cough with sputum -     DG Chest 2 View; Future  LPRD (laryngopharyngeal reflux disease)  Venous (peripheral) insufficiency -     furosemide (LASIX) 20 MG tablet; Take 1 tablet (20 mg total) by mouth daily. -     spironolactone (ALDACTONE) 25 MG tablet;  Take 1 tablet (25 mg total) by mouth daily.  Acute on chronic diastolic congestive heart failure (HCC) -     furosemide (LASIX) 20 MG tablet; Take 1 tablet (20 mg total) by mouth daily. -     spironolactone (ALDACTONE) 25 MG tablet; Take 1 tablet (25 mg total) by mouth daily.   I have discontinued Ms. Schall's furosemide. I am also having her start on furosemide and spironolactone. Additionally, I am having her maintain her Vitamin D3, multivitamins ther. w/minerals, feeding supplement (ENSURE ENLIVE), polyethylene glycol powder, carvedilol, levocetirizine, pantoprazole, ALPRAZolam, and acetaminophen-codeine.  Meds ordered this encounter  Medications  . furosemide (LASIX) 20 MG tablet    Sig: Take 1 tablet (20 mg total) by mouth daily.    Dispense:  30 tablet    Refill:  3    Order Specific Question:   Supervising Provider    Answer:   Cassandria Anger [[id number]]  . spironolactone (ALDACTONE) 25 MG tablet    Sig: Take 1 tablet (25 mg total) by mouth daily.    Dispense:  30 tablet    Refill:  3    Order Specific Question:   Supervising Provider    Answer:   Cassandria Anger [[id number]]    Follow-up: Return in about 3 months (around 06/12/2017) for edema (need repeat BMP).  Wilfred Lacy, NP

## 2017-03-13 NOTE — Telephone Encounter (Signed)
appt set for 03/13/2017

## 2017-03-13 NOTE — Progress Notes (Signed)
Pre visit review using our clinic review tool, if applicable. No additional management support is needed unless otherwise documented below in the visit note. 

## 2017-03-13 NOTE — Patient Instructions (Addendum)
If you have medicare related insurance (such as traditional Medicare, [employer], [employer], or similar), Please make an appointment at the scheduling desk with Sharee Pimple, the [employer], for your Wellness visit in this office, which is a benefit with your insurance.  Go to basement for chest X-ray. You will be called with results.

## 2017-03-13 NOTE — Progress Notes (Signed)
Abstracted result and sent to scan  

## 2017-03-17 ENCOUNTER — Telehealth: Payer: Self-pay | Admitting: *Deleted

## 2017-03-17 NOTE — Telephone Encounter (Signed)
Yes she is to take both medications. I decreased lasix dose and added spironolactone.

## 2017-03-17 NOTE — Telephone Encounter (Signed)
Rec'd call pt states she saw NO on Friday, and wanting to make sure she suppose to take both the furosemide and the spironolactone. She was already taking the furosemide but the directions has change. Pls clarify...Johny Chess

## 2017-03-18 NOTE — Telephone Encounter (Signed)
Notified pt w/MD response.../lmb 

## 2017-04-06 ENCOUNTER — Telehealth: Payer: Self-pay | Admitting: Nurse Practitioner

## 2017-04-06 NOTE — Telephone Encounter (Signed)
Pt would like a call back regarding he medications.   spironolactone (ALDACTONE) 25 MG tablet  furosemide (LASIX) 20 MG tablet  carvedilol (COREG) 3.125 MG tablet

## 2017-04-06 NOTE — Telephone Encounter (Signed)
Left massage for pt to call back.  °

## 2017-04-09 NOTE — Telephone Encounter (Signed)
Pt verbalize understand of test result.   

## 2017-04-09 NOTE — Telephone Encounter (Signed)
I will not change medications at this time. Maintain upcoming appt, will need repeat BMP at that time.

## 2017-04-09 NOTE — Telephone Encounter (Signed)
Spoke with pt, she said her legs still swelling, she feel that medications we put her on is not helping and it is making her go to restroom 5-6 a days. She is concern and not sure if something else need to be done? Please advise.

## 2017-04-23 NOTE — Progress Notes (Signed)
   Subjective:   Patient ID: [name]    DOB: 1918-11-14, 83 y.o. female   MRN: [id number]  CC: "Establish care"  HPI: CARNITA GOLOB is a 83 y.o. female who presents to clinic today to establish care. Problems discussed today are as follows:  High blood pressure: Followed by Dr. Cathie Olden for cardiology. On several medications for her heart. States she has to urinate often because of the blood pressure medications. Taking them daily and tolerating well.  Increased sputum production: Has thick sputum with white appearance. Often has to blow nose to clear throat but denies having issues with breathing or frequent coughing. Nonsmoker. ROS: Denies fevers or chills, chest pain, shortness of breath, cough.  Left lower quadrant pain: Ongoing issue for over a year. Seen by Dr. Oletta Lamas with gastroenterology. Had a workup but did not show anything according to patient. Was taking pain medication without improvement. Takes MiraLAX for bowel regimen. ROS: Denies nausea or vomiting, constipation or diarrhea, hematochezia, melena.  Complete ROS performed, see HPI for pertinent.  [place]: HTN, HFpEF (EF 55-65%, 2010), h/o breast cancer (1994, s/p L-mastectomy), renal cyst. Smoking status reviewed. Medications reviewed.  Objective:   BP 100/60 (BP Location: Right Arm, Patient Position: Sitting, Cuff Size: Small)   Pulse 76   Temp 97.9 F (36.6 C) (Oral)   Ht 5\' 2"  (1.575 m)   Wt 120 lb 9.6 oz (54.7 kg)   SpO2 96%   BMI 22.06 kg/m  Vitals and nursing note reviewed.  General: frail elderly female, well nourished, well developed, in no acute distress with non-toxic appearance HEENT: normocephalic, atraumatic, moist mucous membranes Neck: supple, non-tender without lymphadenopathy CV: regular rate and rhythm without murmurs, rubs, or gallops, no lower extremity edema Lungs: clear to auscultation bilaterally with normal work of breathing Abdomen: soft, non-tender, non-distended, no masses or  organomegaly palpable, normoactive bowel sounds Skin: warm, dry, no rashes or lesions, cap refill < 2 seconds Extremities: warm and well perfused, normal tone  Assessment & Plan:   Abdominal pain, left lower quadrant Chronic. Followed by GI. Denies change in bowel movements. --Discontinuing Tylenol No. 2 --Continue MiraLAX for bowel regimen  Primary hypertension Chronic. Controlled. Followed by cardiology. Nonsmoker. --Continue Coreg, Lasix, spironolactone  Allergic rhinitis Chronic. On allergy medication. No signs of pneumonia or sinusitis. --Continue Xyzal --Discontinue Mucinex and increase water intake  No orders of the defined types were placed in this encounter.  No orders of the defined types were placed in this encounter.   [name], [place], PGY-1 04/24/2017 2:31 PM

## 2017-04-24 ENCOUNTER — Ambulatory Visit (INDEPENDENT_AMBULATORY_CARE_PROVIDER_SITE_OTHER): Payer: Medicare Other | Admitting: Family Medicine

## 2017-04-24 ENCOUNTER — Encounter: Payer: Self-pay | Admitting: Family Medicine

## 2017-04-24 DIAGNOSIS — I10 Essential (primary) hypertension: Secondary | ICD-10-CM

## 2017-04-24 DIAGNOSIS — J309 Allergic rhinitis, unspecified: Secondary | ICD-10-CM | POA: Diagnosis not present

## 2017-04-24 DIAGNOSIS — I5032 Chronic diastolic (congestive) heart failure: Secondary | ICD-10-CM

## 2017-04-24 DIAGNOSIS — R1032 Left lower quadrant pain: Secondary | ICD-10-CM

## 2017-04-24 DIAGNOSIS — Z853 Personal history of malignant neoplasm of breast: Secondary | ICD-10-CM

## 2017-04-24 NOTE — Assessment & Plan Note (Signed)
Chronic. On allergy medication. No signs of pneumonia or sinusitis. --Continue Xyzal --Discontinue Mucinex and increase water intake

## 2017-04-24 NOTE — Patient Instructions (Signed)
Thank you for coming in to see [place] today. Please see below to review our plan for today's visit.  1. It was a pleasure meeting you today. It appears you are doing very well for your age. You're followed by many different doctors. I will continue to follow their recommendations for your care. 2. For your increased sputum production, I would recommend you continuing the allergy medication and discontinuing the Mucinex. Drink plenty of fluids. 3. We will stop the pain medication your prescribed as this was not helping her abdominal pain. This pain has been worked up in there are no reasons to identify why you're having this pain. Continue taking the MiraLAX for your stools. Staying active with help with the pain. 4. Return to the clinic in 3 months.  Please call the clinic at [phone] if your symptoms worsen or you have any concerns. It was my pleasure to see you. -- [name], [place], PGY-1

## 2017-04-24 NOTE — Assessment & Plan Note (Signed)
Chronic. Followed by GI. Denies change in bowel movements. --Discontinuing Tylenol No. 2 --Continue MiraLAX for bowel regimen

## 2017-04-24 NOTE — Assessment & Plan Note (Addendum)
Chronic. Controlled. Followed by cardiology. Nonsmoker. --Continue Coreg, Lasix, spironolactone

## 2017-04-28 DIAGNOSIS — R1084 Generalized abdominal pain: Secondary | ICD-10-CM | POA: Diagnosis not present

## 2017-04-28 DIAGNOSIS — K5900 Constipation, unspecified: Secondary | ICD-10-CM | POA: Diagnosis not present

## 2017-05-02 ENCOUNTER — Encounter (HOSPITAL_COMMUNITY): Payer: Self-pay | Admitting: Emergency Medicine

## 2017-05-02 ENCOUNTER — Ambulatory Visit (HOSPITAL_COMMUNITY)
Admission: EM | Admit: 2017-05-02 | Discharge: 2017-05-02 | Disposition: A | Discharge status: Home / Self Care | Payer: Medicare Other | Attending: Family Medicine | Admitting: Family Medicine

## 2017-05-02 DIAGNOSIS — R1032 Left lower quadrant pain: Secondary | ICD-10-CM

## 2017-05-02 NOTE — ED Provider Notes (Signed)
CSN: [id number]     Arrival date & time 05/02/17  1738 History   First MD Initiated Contact with Patient 05/02/17 1857     Chief Complaint  Patient presents with  . Abdominal Pain   (Consider location/radiation/quality/duration/timing/severity/associated sxs/prior Treatment) Patient is a 83 y.o. Female, here for LLQ abdominal pain, came primary due to her son's request, who is not present in room. She reports to have this LLQ abdominal pain x 8 months gradually getting worse. She was diagnosed with diverticulitis in the ER in 09/2016 however patient denies history of diverticulosis. Patient has a Copywriter, advertising she sees regularly; she has appointment with her gastroenterologist next Thursday. Her gastroenterologist has a CT Abd scheduled for her at an imaging center on Monday. Patient doesn't want to be here. She endorses weight loss. She denies fever. She denies N/V/D. No CP and no SOB reported as well.       Past Medical History:  Diagnosis Date  . Allergic rhinitis, cause unspecified 02/21/2014  . Anxiety state, unspecified   . Breast cancer ([place])    NO STICK OR BLOOD PRESSURE CHECKS IN LEFT ARM  . Carpal tunnel syndrome   . Cervical spondylosis   . Colon polyp 12/19/2009   Transverse-polypoid colorectal mucosa  . Constipation   . Degenerative joint disease   . Diastolic dysfunction   . Diverticula of colon   . Gastritis, chronic   . Hiatal hernia   . Hypertension   . Low back pain   . RBBB (right bundle branch block with left anterior fascicular block)   . Renal cyst   . Venous insufficiency    Past Surgical History:  Procedure Laterality Date  . CARPAL TUNNEL RELEASE    . Left mastectomy     Family History  Problem Relation Age of Onset  . Colon cancer Neg Hx    Social History  Substance Use Topics  . Smoking status: Never Smoker  . Smokeless tobacco: Never Used  . Alcohol use No   OB History    No data available     Review of Systems  Constitutional:       See HPI    Allergies  Fentanyl  Home Medications   Prior to Admission medications   Medication Sig Start Date End Date Taking? Authorizing Provider  carvedilol (COREG) 3.125 MG tablet Take 1 tablet (3.125 mg total) by mouth 2 (two) times daily. 01/19/17  Yes Nche, Charlene Brooke, NP  Cholecalciferol (VITAMIN D3) 1000 UNITS CAPS Take 1,000 Units by mouth daily. For your bones   Yes [provider]  feeding supplement, ENSURE ENLIVE, (ENSURE ENLIVE) LIQD Take 237 mLs by mouth 2 (two) times daily between meals. 09/30/16  Yes Nche, Charlene Brooke, NP  furosemide (LASIX) 20 MG tablet Take 1 tablet (20 mg total) by mouth daily. 03/13/17  Yes Nche, Charlene Brooke, NP  levocetirizine (XYZAL) 5 MG tablet Take 1 tablet (5 mg total) by mouth every evening. 01/19/17  Yes Nche, Charlene Brooke, NP  Multiple Vitamins-Minerals (MULTIVITAMINS THER. W/MINERALS) TABS Take 1 tablet by mouth daily.   Yes [provider]  pantoprazole (PROTONIX) 40 MG tablet Take 1 tablet (40 mg total) by mouth 2 (two) times daily. 01/19/17  Yes Nche, Charlene Brooke, NP  polyethylene glycol powder (GLYCOLAX/MIRALAX) powder Take 17 g by mouth daily as needed. 11/26/16  Yes Nche, Charlene Brooke, NP  spironolactone (ALDACTONE) 25 MG tablet Take 1 tablet (25 mg total) by mouth daily. 03/13/17  Yes Nche, Charlene Brooke,  NP  ALPRAZolam (XANAX) 0.25 MG tablet Take 1 tablet (0.25 mg total) by mouth at bedtime as needed for anxiety or sleep. 03/10/17   Nche, Charlene Brooke, NP   Meds Ordered and Administered this Visit  Medications - No data to display  BP (!) 150/56 (BP Location: Right Arm)   Pulse 71   Temp 98.4 F (36.9 C) (Oral)   Resp 16   SpO2 100%  No data found.   Physical Exam  Constitutional: She is oriented to person, place, and time. She appears well-developed and well-nourished.  Cardiovascular: Normal rate, regular rhythm and normal heart sounds.   No murmur heard. Pulmonary/Chest: Effort normal and breath  sounds normal. She has no wheezes.  Abdominal: Soft. Bowel sounds are normal.  Tender to palpate over LLQ  Neurological: She is alert and oriented to person, place, and time.  Skin: Skin is warm and dry.  Nursing note and vitals reviewed.   Urgent Care Course     Procedures (including critical care time)  Labs Review Labs Reviewed - No data to display  Imaging Review No results found.   MDM   1. Left lower quadrant pain    Patient seems to be alert and oriented with good judgement.  Patient informed that she may have diverticulitis and advised to be placed on antibiotic and f/u with her gastroenterologist as scheduled next week. However patient declined the antibiotic. She states that she doesn't need to be here. She states that she rather just wait for her CT scan next week and see her gastroenterologist on Thursday. Patient got up and started walking out the door as she speaks. Return precaution informed.    Barry Dienes, NP 05/02/17 1945

## 2017-05-02 NOTE — ED Triage Notes (Signed)
Pt c/o LLQ pain onset 7 days associated w/wt loss  Has seen her GI Dr for similar sx  Pain increases w/activity   Denies fevers, n/v/d  A&O x4... NAD... Walker present upon arrival

## 2017-05-04 ENCOUNTER — Other Ambulatory Visit: Payer: Self-pay | Admitting: Physician Assistant

## 2017-05-04 ENCOUNTER — Ambulatory Visit
Admission: RE | Admit: 2017-05-04 | Discharge: 2017-05-04 | Disposition: A | Discharge status: Home / Self Care | Payer: Medicare Other | Source: Ambulatory Visit | Attending: Physician Assistant | Admitting: Physician Assistant

## 2017-05-04 DIAGNOSIS — R1084 Generalized abdominal pain: Secondary | ICD-10-CM

## 2017-05-04 DIAGNOSIS — R109 Unspecified abdominal pain: Secondary | ICD-10-CM | POA: Diagnosis not present

## 2017-05-05 ENCOUNTER — Other Ambulatory Visit: Payer: Self-pay | Admitting: Family Medicine

## 2017-05-05 DIAGNOSIS — I872 Venous insufficiency (chronic) (peripheral): Secondary | ICD-10-CM

## 2017-05-05 DIAGNOSIS — I5033 Acute on chronic diastolic (congestive) heart failure: Secondary | ICD-10-CM

## 2017-05-05 NOTE — Telephone Encounter (Signed)
Pt needs a refill on spironolactone. [facility] on [address]

## 2017-05-06 MED ORDER — SPIRONOLACTONE 25 MG PO TABS
25.0000 mg | ORAL_TABLET | Freq: Every day | ORAL | 3 refills | Status: DC
Start: 2017-05-06 — End: 2017-05-21

## 2017-05-19 ENCOUNTER — Telehealth: Payer: Self-pay | Admitting: Family Medicine

## 2017-05-19 NOTE — Telephone Encounter (Signed)
Heather [name] spoke with patient son and scheduled patient for 7/5.

## 2017-05-19 NOTE — Telephone Encounter (Signed)
Pt wants to talk to a nurse about her medicines. She is so confused. I am not really sure what she was saying. She said her Dr was Dr Walker Kehr.

## 2017-05-21 ENCOUNTER — Encounter: Payer: Self-pay | Admitting: Family Medicine

## 2017-05-21 ENCOUNTER — Ambulatory Visit (INDEPENDENT_AMBULATORY_CARE_PROVIDER_SITE_OTHER): Payer: Medicare Other | Admitting: Family Medicine

## 2017-05-21 VITALS — BP 110/61 | HR 87 | Temp 99.2°F | Wt 118.0 lb

## 2017-05-21 DIAGNOSIS — I872 Venous insufficiency (chronic) (peripheral): Secondary | ICD-10-CM

## 2017-05-21 DIAGNOSIS — K2100 Gastro-esophageal reflux disease with esophagitis, without bleeding: Secondary | ICD-10-CM

## 2017-05-21 DIAGNOSIS — J301 Allergic rhinitis due to pollen: Secondary | ICD-10-CM | POA: Diagnosis not present

## 2017-05-21 DIAGNOSIS — K5900 Constipation, unspecified: Secondary | ICD-10-CM | POA: Diagnosis not present

## 2017-05-21 DIAGNOSIS — E559 Vitamin D deficiency, unspecified: Secondary | ICD-10-CM | POA: Diagnosis not present

## 2017-05-21 DIAGNOSIS — R1032 Left lower quadrant pain: Secondary | ICD-10-CM

## 2017-05-21 DIAGNOSIS — I10 Essential (primary) hypertension: Secondary | ICD-10-CM

## 2017-05-21 DIAGNOSIS — F411 Generalized anxiety disorder: Secondary | ICD-10-CM

## 2017-05-21 DIAGNOSIS — I5033 Acute on chronic diastolic (congestive) heart failure: Secondary | ICD-10-CM

## 2017-05-21 MED ORDER — VITAMIN D3 25 MCG (1000 UT) PO CAPS: 1000.0000 [IU] | ORAL_CAPSULE | Freq: Every day | ORAL | 3 refills | Status: DC

## 2017-05-21 MED ORDER — CARVEDILOL 3.125 MG PO TABS: 3.1250 mg | ORAL_TABLET | Freq: Two times a day (BID) | ORAL | 2 refills | Status: DC

## 2017-05-21 MED ORDER — LEVOCETIRIZINE DIHYDROCHLORIDE 5 MG PO TABS: 5.0000 mg | ORAL_TABLET | Freq: Every evening | ORAL | 2 refills | Status: DC

## 2017-05-21 MED ORDER — FUROSEMIDE 20 MG PO TABS: 20.0000 mg | ORAL_TABLET | Freq: Every day | ORAL | 3 refills | Status: DC

## 2017-05-21 MED ORDER — PANTOPRAZOLE SODIUM 40 MG PO TBEC: 40.0000 mg | DELAYED_RELEASE_TABLET | Freq: Two times a day (BID) | ORAL | 2 refills | Status: DC

## 2017-05-21 MED ORDER — POLYETHYLENE GLYCOL 3350 17 GM/SCOOP PO POWD: 17.0000 g | Freq: Every day | ORAL | 3 refills | Status: DC | PRN

## 2017-05-21 MED ORDER — ALPRAZOLAM 0.25 MG PO TABS: 0.2500 mg | ORAL_TABLET | Freq: Every evening | ORAL | 0 refills | Status: DC | PRN

## 2017-05-21 MED ORDER — SPIRONOLACTONE 25 MG PO TABS: 25.0000 mg | ORAL_TABLET | Freq: Every day | ORAL | 3 refills | Status: DC

## 2017-05-21 NOTE — Assessment & Plan Note (Signed)
Given previous negative w/u including CT--unlikely to be diverticulitis or hernia. Other things in this area, include ovaries--unlikely to be active or bothering her at this age. Will attempt to rule out hip as a possible culprit. She also complains of knee pain.

## 2017-05-21 NOTE — Progress Notes (Signed)
Subjective:    Patient ID: Heather [name] is a 83 y.o. female presenting with No chief complaint on file.  on 05/21/2017  HPI: Reports pain in her lower abdomen. It is going on x 7-8 months per patient or 2-3 years per her son.  Pain is worse with rising, walking. She denies change in bowel habits. Pain has been evaluated by CT abd x 2 which were negative. Has lost a lot of weight and she is concerned about this. I have reviewed and her weight is stable over last 2 years. Notes leg swelling. Support hose x 3-4 months and swelling is worse.  Review of Systems  Constitutional: Negative for chills and fever.  Respiratory: Negative for shortness of breath.   Cardiovascular: Negative for chest pain.  Gastrointestinal: Negative for abdominal pain, nausea and vomiting.  Genitourinary: Negative for dysuria.  Musculoskeletal: Positive for joint swelling.  Skin: Negative for rash.      Objective:    BP 110/61   Pulse 87   Temp 99.2 F (37.3 C) (Oral)   Wt 118 lb (53.5 kg)   SpO2 95%   BMI 21.58 kg/m  Physical Exam  Constitutional: She is oriented to person, place, and time. She appears well-developed and well-nourished. No distress.  HENT:  Head: Normocephalic and atraumatic.  Eyes: No scleral icterus.  Neck: Neck supple.  Cardiovascular: Normal rate.   Pulmonary/Chest: Effort normal.  Abdominal: Soft. Bowel sounds are normal. She exhibits no distension. There is no tenderness.  Musculoskeletal:  On exam of her abdomen, her pain is in the left groin or just above and may actually be her hip that is bothering her  Neurological: She is alert and oriented to person, place, and time.  Skin: Skin is warm and dry.  Psychiatric: She has a normal mood and affect.        Assessment & Plan:   Problem List Items Addressed This Visit      Unprioritized   Anxiety state    Refilled her xanax which she takes only occasionally      Relevant Medications   ALPRAZolam (XANAX) 0.25  MG tablet   Primary hypertension    Continue carvedilol and spironalactone      Relevant Medications   carvedilol (COREG) 3.125 MG tablet   furosemide (LASIX) 20 MG tablet   spironolactone (ALDACTONE) 25 MG tablet   Diastolic CHF (HCC)    Continue Lasix, spironolactone and Carvedilol      Relevant Medications   carvedilol (COREG) 3.125 MG tablet   furosemide (LASIX) 20 MG tablet   spironolactone (ALDACTONE) 25 MG tablet   Venous (peripheral) insufficiency   Relevant Medications   carvedilol (COREG) 3.125 MG tablet   furosemide (LASIX) 20 MG tablet   spironolactone (ALDACTONE) 25 MG tablet   GERD    Continue Pantoprazole      Relevant Medications   pantoprazole (PROTONIX) 40 MG tablet   polyethylene glycol powder (GLYCOLAX/MIRALAX) powder   Constipation    Continue Miralax      Relevant Medications   polyethylene glycol powder (GLYCOLAX/MIRALAX) powder   Abdominal pain, left lower quadrant - Primary    Given previous negative w/u including CT--unlikely to be diverticulitis or hernia. Other things in this area, include ovaries--unlikely to be active or bothering her at this age. Will attempt to rule out hip as a possible culprit. She also complains of knee pain.      Relevant Orders   DG HIP UNILAT WITH PELVIS 2-3 VIEWS  LEFT   Allergic rhinitis    Continue Xyzal.      Relevant Medications   levocetirizine (XYZAL) 5 MG tablet   Vitamin D deficiency    Continue Vitamin D      Relevant Medications   Cholecalciferol (VITAMIN D3) 1000 units CAPS      Total face-to-face time with patient: 25 minutes. Over 50% of encounter was spent on counseling and coordination of care. Return in about 4 weeks (around 06/18/2017) for see Dr. McDiarmid  geriatric clinic.   Donnamae Jude 05/21/2017 4:08 PM

## 2017-05-21 NOTE — Assessment & Plan Note (Signed)
Continue carvedilol and spironalactone

## 2017-05-21 NOTE — Assessment & Plan Note (Signed)
Refilled her xanax which she takes only occasionally

## 2017-05-21 NOTE — Assessment & Plan Note (Signed)
Continue Vitamin D

## 2017-05-21 NOTE — Assessment & Plan Note (Signed)
Continue Xyzal. 

## 2017-05-21 NOTE — Assessment & Plan Note (Signed)
Continue Pantoprazole.

## 2017-05-21 NOTE — Patient Instructions (Signed)
Hip Pain The hip is the joint between the upper legs and the lower pelvis. The bones, cartilage, tendons, and muscles of your hip joint support your body and allow you to move around. Hip pain can range from a minor ache to severe pain in one or both of your hips. The pain may be felt on the inside of the hip joint near the groin, or the outside near the buttocks and upper thigh. You may also have swelling or stiffness. Follow these instructions at home: Managing pain, stiffness, and swelling   If directed, apply ice to the injured area.  Put ice in a plastic bag.  Place a towel between your skin and the bag.  Leave the ice on for 20 minutes, 2-3 times a day  Sleep with a pillow between your legs on your most comfortable side.  Avoid any activities that cause pain. General instructions   Take over-the-counter and prescription medicines only as told by your health care provider.  Do any exercises as told by your health care provider.  Record the following:  How often you have hip pain.  The location of your pain.  What the pain feels like.  What makes the pain worse.  Keep all follow-up visits as told by your health care provider. This is important. Contact a health care provider if:  You cannot put weight on your leg.  Your pain or swelling continues or gets worse after one week.  It gets harder to walk.  You have a fever. Get help right away if:  You fall.  You have a sudden increase in pain and swelling in your hip.  Your hip is red or swollen or very tender to touch. Summary  Hip pain can range from a minor ache to severe pain in one or both of your hips.  The pain may be felt on the inside of the hip joint near the groin, or the outside near the buttocks and upper thigh.  Avoid any activities that cause pain.  Record how often you have hip pain, the location of the pain, what makes it worse and what it feels like. This information is not intended to  replace advice given to you by your health care provider. Make sure you discuss any questions you have with your health care provider. Document Released: 04/23/2010 Document Revised: 10/06/2016 Document Reviewed: 10/06/2016 [employer] Interactive Patient Education  2017 [employer]  

## 2017-05-21 NOTE — Assessment & Plan Note (Signed)
Continue Lasix, spironolactone and Carvedilol

## 2017-05-21 NOTE — Assessment & Plan Note (Signed)
Continue Miralax

## 2017-05-25 ENCOUNTER — Ambulatory Visit
Admission: RE | Admit: 2017-05-25 | Discharge: 2017-05-25 | Disposition: A | Discharge status: Home / Self Care | Payer: Medicare Other | Source: Ambulatory Visit | Attending: Family Medicine | Admitting: Family Medicine

## 2017-05-25 DIAGNOSIS — M1612 Unilateral primary osteoarthritis, left hip: Secondary | ICD-10-CM | POA: Diagnosis not present

## 2017-05-25 DIAGNOSIS — R1032 Left lower quadrant pain: Secondary | ICD-10-CM

## 2017-05-26 ENCOUNTER — Telehealth: Payer: Self-pay

## 2017-05-26 NOTE — Telephone Encounter (Signed)
I contacted pt to inform of results and plan. Pt was having a hard time understanding what was going on. I then called her son and informed him of result and plan. Pt is scheduled to see pcp 7/25. Pts son voiced understanding.

## 2017-05-26 NOTE — Telephone Encounter (Signed)
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 She may f/u with her PCP to discuss treatment options.

## 2017-06-01 ENCOUNTER — Ambulatory Visit (INDEPENDENT_AMBULATORY_CARE_PROVIDER_SITE_OTHER): Payer: Medicare Other | Admitting: Pulmonary Disease

## 2017-06-01 ENCOUNTER — Encounter: Payer: Self-pay | Admitting: Pulmonary Disease

## 2017-06-01 VITALS — BP 112/60 | HR 82 | Ht 62.0 in | Wt 119.0 lb

## 2017-06-01 DIAGNOSIS — J410 Simple chronic bronchitis: Secondary | ICD-10-CM

## 2017-06-01 DIAGNOSIS — F450 Somatization disorder: Secondary | ICD-10-CM

## 2017-06-01 DIAGNOSIS — K449 Diaphragmatic hernia without obstruction or gangrene: Secondary | ICD-10-CM

## 2017-06-01 DIAGNOSIS — K219 Gastro-esophageal reflux disease without esophagitis: Secondary | ICD-10-CM

## 2017-06-01 DIAGNOSIS — K224 Dyskinesia of esophagus: Secondary | ICD-10-CM

## 2017-06-01 DIAGNOSIS — F411 Generalized anxiety disorder: Secondary | ICD-10-CM

## 2017-06-01 DIAGNOSIS — R918 Other nonspecific abnormal finding of lung field: Secondary | ICD-10-CM

## 2017-06-01 MED ORDER — MELOXICAM 7.5 MG PO TABS: 7.5000 mg | ORAL_TABLET | Freq: Every day | ORAL | 0 refills | Status: DC

## 2017-06-01 NOTE — Patient Instructions (Signed)
Today we updated your med list in our EPIC system...    Continue your current medications the same...  Today we wrote a new prescription for MELOXICAM 7.5mg  tabs -- take one tab daily for the next 7 days to see if it helps your pain...  Report the result to your PCP at the upcoming appt.  For the thick mucus -- remember to use the OTC MUCINEX 600mg  tabs one tab up to 4 times daily w/ extra fluids for the thick phlegm...  Call for any questions or if we can be of service in any way.Marland KitchenMarland Kitchen

## 2017-06-01 NOTE — Progress Notes (Signed)
Subjective:    Patient ID: Heather [name], female    DOB: April 22, 1918, 83 y.o.   MRN: [id number]  HPI 83 y/o BF here for a follow up visit... she has multiple medical problems including:  Dyspnea & Anxiety;  HBP;  Hx CP followed by Heather [name];  HH/ Gastritis;  Divertics/ Constip;  Functional Abd Pain;  Renal cyst followed by Heather [name];  Hx left breast [phone];  DJD/ CSpine spondy/ LBP/ CTS; and she has a cancer phobia...   ~  SEE PREV EPIC NOTES FOR OLDER DATA >> She has established Primary Care follow up w/ Heather [name] => Heather [name], Heather [name]...   CXR 10/14 showed normal heart size w/ tortuous atherosclerotic Ao, clear lungs w/ ?vague opac left mid zone adjacent to hilum (nothing seen in this area on CT 3/14) ?overlying shadows & we will f/u...  Ambulatory O2 check 10/14> O2 sat= 98% on room air at rest w/ pulse=83, regular; Ambulated 3 laps w/ lowest O2 sat= 96% w/ pulse=108...  LABS 10/14:  Chems- wnl;  CBC- wnl;  TSH=2.04;  VitD=71;  Sed=11...  ~  Heather [name] has established w/ Heather [name] for Primary care and has seen him x3 already... She still sees Heather [name] for ENT and Heather [name] for [name]>>  Baseline CXRs showed sl overinflation, clear lungs, normal heart size w/ elongation & tortuous Ao...  Prev CT Chest/ CTA 3/14 showed no PE, no acute findings, +atherosclerotic changes, several scattered tiny nodules 3-32mm size & no change from 2009 scan, s/p left mastectomy, DJD Tspine...   PFTs 9/13 were WNL> FVC=2.62 (128%), FEV1=2.20 (158%), %1sec=84; mid-flows=274% of predicted  CXR 04/04/14 shows stable heart size, atherosclerotic & tortuous Ao, mild hyperinflation, clear lungs w/o acute changes, DJD Tspine...   PFT 07/03/14> despite mult attempts w/ several test administrators/ staff- she was unable to perform the procedure due to inability to cooperate...  Ambulatory O2sat Test 07/03/14 on RA>  Baseline O2sat= 99% w/ pulse=86;  After 3 laps her nadir O2sat= 96% w/ max pulse= 114...  ~   September 21, 2014:  26mo ROV & pt added-on today at her request for mult symptoms> but on questioning it is apparent that her symptomatology is chronic w/o acute features; she notes cough ("hocking cough"), thick white sputum ("with white balls"), chest congestion; but she denies to me any f/c/s, CP, hemoptysis, etc and she notes that "my breathing is good";;;  Then she launches into a litany of complaints- poor appetite, wt loss ("I'm nothing but a frame"), back pain ("the drawing kind"), and general flight of ideas;  Meds reviewed & include prev ZPak & Mucinex (?taking 1 Qid);  Exam is neg w/ clear chest- no wheezing, rales, or rhonchi hear;  Last CXR was 5/15 showing essentially clear lungs, NAD...    She saw Heather [name] for Cards 9/15> Hx HBP, CHF/ DD, AtypCP, RBBB, neg Myoview 2009; on Coreg3.125Bid, Amlod2.5, Lasix40 as needed for swelling; BP= 112/60 today...     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     Hx mult Ortho complaints and LBP evaluated by Heather [name] w/ Epid shots per Heather [name] but she claims no improvement; notes Tramadol doesn't help and want Vicodin refilled, she wears a back brace...     Very anxious and has Celexa10 & Alprazolam 0.5mg  on her med list...   EKG 9/15 showed NSR, rate81, LAD, RBBB, NSSTTWA, NAD...  PLAN>>  Heather [name] is reassured- her breathing is stable w/o acute lung problems at this time; she is asked to continue the Mucinex600mg  Qid w/ fluids, and remain as active as possible; reminded to use her ProtonixBid & follow antireflux regimen to help her LPR;  She will f/u w/ her other physicians and see me in 6 months for re-evaluation & f/u CXR...  ~  Mar 22, 2015:  67mo ROV & Heather [name] says "I'm just skin & bones" weight is noted to be down 15# to 120# today (BMI=22);  She says that her dentist noted her teeth are  wearing out & this may acct for some wt loss- needs to incr nutritional supplement like BOOST Bid;  She persists w/ mult medical complaints- "this swallowing business" and she is seeing both Heather [name] [name] and [name] for help w/ reflux and cancer phobia "it can show up anywhere";  From the resp standpoint she notes same "hocking" throat clearing cough w/ thick, white, foamy sput; she notes SOB/DOE w/o change and she is still not taking the Alpraz or the Mucinex as requested...      She saw Heather [name] 1/16> note reviewed & felt to be stable... She tells me she has switched Primary Care to Heather [name]...      She saw Heather [name] [name] 1/16 and they felt no further [name] w/u warranted, use abd binder prn...      She saw DrEdwards, [name] 2/16> bloating, constip, wt loss> note reviewed EXAM reveals Afeb, VSS, O2sat=99% on RA;  HEENT- neg x sl dry MMs;  Chest- she has a forced dry hacking cough that is nonproductive, clear w/o w/r/r;  Heart- RR w/o m/r/g heard;  Abd- soft, nontender, neg...  We reviewed prob list, meds, xrays and labs> see below for updates >>   LABS 11/2014 were WNL.Marland KitchenMarland Kitchen  CXR 03/22/15 showed stable cardiomeg, chronic changes, old right rib fxs, NAD...  PLAN>>  Please Heather [name] take the Mucinex600mg - one tab Qid w/ fluids, and try the Alprazolam 0.5mg  1/2 tab Tid to start; I have tried to reassure her as best I can, she needs to eat better & take the nutritional supplement Bid...  (NOTE> I spent 9min face-to-face w/ pt discussing her symptoms, examining pt, reviewing recommendations, and answering questions)  ~  July 16, 2015:  66mo ROV & Heather [name] presents w/ her usual litany of somatic complaints- "terrible" she says; c/o feeling hot, eyes run water, had dental work done, still w/ mult [name] issues, unhappy w/ PCP, etc... She saw DrEdwards for [name] eval & his note is reviewed; she went to the ER 05/17/15 w/ [name] complaints and the ER physician ordered a CT Abd & Pelvis> a few tiny 3-52mm nodules were noted in the lateral aspect of the  RML, the abd revealed aortoiliac calcif w/o aneurysm, right renal cyst, otherw no signif abn noted (Radiology rec f/u CT Chest in 35yr); she is very concerned and I indicated that I thought this was prob some scar tissue but we would recheck her CXR in 59mo & follow up the CT in 31yr...     AB> she c/o cough "hoking little white balls"; baseline CXRs have been clear (old right rib fxs), PFT in 2013 was wnl, oxygenation is wnl, and exam is clear; she has been asked to use Mucinex Qid but she won't do it...    Abn CT scan w/ scat pulm nodules> prev CT Chest w/ tiny left apical oapc, unchanged serially & felt to be benign; CT Abd done 05/17/15 w/  tiny 3-49mm RML nodules detected, otherw eg & we discussed f/u CXR in 21mo & CT Chest in 66yr.     HBP> on Coreg3.125Bid, Lasix40, off Amlod; BP= 106/60 & she persists w/ mult somatic complaints; last 2DEcho 10/2009 showed norm LV size & func w/ EF=55-60%, mildAI....    HxCP> on ASA81; followed by Heather [name]/ DrNishan for Cards & their prev notes are reviewed=> neg ischemic work up previously...     VI, Edema> she went to the ER 11/13 w/ edema; VDopplers were neg for DVT; they reiterated the recs for no salt, elevation, support hose, & continue Lasix40.     [name]- HH, Gastirits, Divertics, chronic pain & "swelling"> on Protonix40Bid plus Zantac, Miralax & Senakot-S, etc; she persists w/ mult [name] complaints despite prev evals by [facility], [facility], [facility]...    Renal cyst> she is very anxious about everything & has a cancer phobia; Heather [name] followed this benign simple cyst for yrs- no change...     Hx Breast cancer> she had left mod radical mastectomy 1994 by DrBlievernicht- no known recurrence & she is given reassurance...     Ortho- DJD, Cspine spondy, LBP, CTS> followed by Heather [name] & he injected her right knee & her back; on Vicodin prn...    Anxiety> on Alpraz0.5mg Tid but "I'm not taking it very often" she says & encouraged to use more regularly...  We reviewed  prob list, meds, xrays and labs> see below for updates >> her PCP is Artist Beach, Animal nutritionist at [facility]...  Ba swallow 03/23/15 showed espohageal dysmotility w/ full column stasis and tertiart contractions, no stricture detected...  CT Abd & Pelvis 05/17/15 showed a few tiny 3-70mm nodules were noted in the lateral aspect of the RML, the abd revealed aortoiliac calcif w/o aneurysm, right renal cyst, otherw no signif abn noted (Radiology rec f/u CT Chest in 55yr).  LABS 05/17/15 via ER> Chems- wnl;  CBC- wnl... IMP/PLAN>>  I told Izzy that the tiny nodules seen in the RML area on CT Abd were likely benign & reassured her that we would follow this area closely w/ CXR in 41mo & CT Chest in 38mo; in the meanwhile she will continue her current meds & maintain follow up w/ her PCP & various specialists as needed...   ~  January 17, 2016:  49mo ROV & Krizia notes that her breathing is actually doing very well- SOB at baseline, denies CP, palpit, cough, sput, hemoptysis, etc... However she continues to have a litany of somatic complaints that she enumerates at each visit> she tells me that she has left Heather [name] because he didn't know anything else to do & is now seeing Alphonsa Gin at [facility]; he [name] is DrEdwards... "I'm noting but skin & bones", "foood don't do me no good", "my appetite is good & I eat soul food", "I know what's going on w/ me" but she didn't elaborate, "some meds don't agree w/ others", last med was $400 she says (from Urology), "there's nothing in my stomach except my inards", she was upset w/ AVS showing diagnosis of anxiety...     AB> she c/o cough "hoking little white balls"; baseline CXRs have been clear (old right rib fxs), PFT in 2013 was wnl, oxygenation is wnl, and exam is clear; she has been asked to use Mucinex Qid but she won't do it...    Abn CT scan w/ scat pulm nodules> prev CT Chest w/ tiny left apical opac, unchanged serially & felt to be benign; CT Abd done 05/17/15 w/  tiny 3-67mm RML  nodules detected, otherw neg & we discussed f/u CXR in 30mo & CT Chest in 37yr.     HxCP> on ASA81; followed by Heather [name]/ DrNishan for Cards & their prev notes are reviewed=> neg ischemic work up previously...     [name]- HH, Gastirits, Divertics, chronic pain & "swelling"> on Protonix40Bid plus Zantac, Miralax & Senakot-S, etc; she persists w/ mult [name] complaints despite prev evals by [facility], [facility], [facility]; most recent testing 09/2015 by Heather [name]- see imaging studies below;  ENT eval by DrJRosen...    Mult Medical Issues> now followed by PCP Heather [name] EXAM reveals Afeb, VSS, O2sat=99% on RA;  HEENT- neg x sl dry MMs;  Chest- she has a forced dry hacking cough that is nonproductive, clear w/o w/r/r;  Heart- RR w/o m/r/g heard;  Abd- soft, nontender, neg...   EKG 08/2015>  NSR, rate74, RBBB, otherw neg ekg...  CXR 09/2105>  Borderline heart size, tortuous Ao, clear sl hyperexpanded lungs, no adenopathy- NAD...   LABS in Epic 09/2015>  Chems- wnl;  CBC- wnl  Abd Sonar 11/2015> heterogeneous liver texture- rec MRI for further eval...  MRI Abd 11/2015> no suspicious hepatic lesions, incidental tiny cyst noted, otherw MRI of Abd was neg...   MBS 11/2015> mod oral phase dysphagia, see full report IMP/PLAN>>  Braylen is stable from the pulm standpoint;  She has mult somatic complaints & a large physician contingent attending to her various symptoms;  I suggested to her that she would be best served by taking the Xanax regularly 0.5mg - 1/2 to 1 tab Tid regularly...   ~  May 28, 2016:  73mo ROV & Cleon's breathing is at baseline but she complained to her PCP about DOE w/ walking (uses walker) & he told her "I better get it checked out";  Today c/o "I'm just a frame- all skin & bones", "I stay hungry", "I eat soul food & I can't eat no more", "they've done a lot of tests and can't find nothing"; rambling hx & flight of ideas- she is c/o dental issues, ribs sticking out, & still c/o a knot in her  left flank area-- I reminded her to be sure to mention all this to her [name] Oletta Lamas), Ortho Durward Fortes) & PCP Lysle Rubens) so he can check this out;  We talked about her abd/ back/ legs/ skin/ etc, she wants a stong pain pill, she says she's lonely at 46- "all my friend's are gone"...    From the pulmonary standpoint-- she c/o being winded after long walks (uses walke- note: she had outpt PT 01/2016), cough w/ little white balls, etc;  Meds include Flonase Qhs and Mucinex600Bid w/ fluids;  Exam is clear & XRay/Labs in the last yr all wnl...  EXAM reveals Afeb, VSS, O2sat=98% on RA;  HEENT- neg x sl dry MMs;  Chest- she has a forced dry hacking cough that is nonproductive, clear w/o w/r/r;  Heart- RR w/o m/r/g heard;  Abd- soft, nontender, neg...   Art Dopplers of LEs 03/31/16>  Normal ABIs & no evid of arterial occlusive dis  LABS 04/2016 in epic>  Chems- wnl;  CBC- wnl  CT Abd & Pelvis 05/02/16>  Mod stool burden in right colon, no obstruction, NAD (she was seen in the Er for abd pain)  She had Speech Path swallowing eval 05/22/16>  Note reviewed- mild oral phase & pharyngeal dysphagia, no aspiration or penetration, esoph dysmotility; recommendations given to pt...  IMP/PLAN>>  Shelagh appears stable- chest is clear, oxygenation  excellent, and she presents w/ the same mult somatic complaints; she still won't try the Xanax as I've suggested; f/u w/ me as needed...  ~  December 01, 2016:  1mo ROV & pulmonary followup visit>  Breea has established primary care w/ Wilfred Lacy, NP in [name] office; since I saw her last she has had 5+ PCP office visits and 7+ ER visits for a variety of complaints- divertics, ecchymoses, right arm pain, left groin discomfort;  Today she speaks rapidly w/ pressure of speech (barely taking a breath betw words), flight of ideas and mult somatic complaints... "Too much medicine for my age" "I'm nothing but a frame" she is still convinced there is something in her abd despite  mult evaluations over the yrs....    AB> she c/o cough "hoking little white balls"; baseline CXRs have been clear (old right rib fxs), PFT in 2013 was wnl, oxygenation is wnl, and exam is clear; she has been asked to use Mucinex Qid but she won't do it...    Abn CT scan w/ scat pulm nodules> prev CT Chest w/ tiny left apical opac, unchanged serially & felt to be benign; CT Abd done 05/17/15 w/ tiny 3-36mm RML nodules detected, otherw neg & we discussed f/u CXR in 73mo & CT Chest in 2yr.     HxCP> on Coreg3.125Bid & Lasix40; BP=110/60 & she has a pos ROS; followed by Heather [name]/ [name] for Cards & their prev notes are reviewed=> neg ischemic work up previously...     [name]- HH, Gastirits, Divertics, chronic pain & "swelling"> on Protonix40Bid plus Zantac, Miralax & Senakot-S, etc; she persists w/ mult [name] complaints despite prev evals by [facility], [facility], [facility]; most recent testing 09/2015 by Heather [name]- see imaging studies below;  ENT eval by DrJRosen...    Mult Medical Issues> now followed by PCP-CNche, but she has switched several times... EXAM reveals Afeb, VSS, O2sat=99% on RA;  HEENT- neg x sl dry MMs;  Chest- she has a forced dry hacking cough that is nonproductive, clear w/o w/r/r;  Heart- RR w/o m/r/g heard;  Abd- soft, nontender, neg...   CXR 07/28/16>  Mild cardiomegaly, left basilar atelectasis, distal left clavicular fx & old right post rib fxs...  CT Abd & Pelvis 10/2016>  Lung bases clear, aortic atherosclerosis, neg abd structures and no obstructive uropathy...  LABS 10/2016 in epic> Chems- wnl;  CBC- wnl IMP/PLAN>>  Heavin is stable from there pulmonary standpoint; encouraged to use Mucinex & drink plenty of water; needs to be careful & avoid falling!   ~  June 01, 2017:  64mo ROV & Musette notes that he breathing is good w/ min chr cough/ phlegm in those small white balls that she describes in detail; reminded to take the Mucinex600mg Qid w/ fluids on a regular dosing schedule;   Her CC remains her stomach- c/o a knot in her abd that no one has been able to find w/ CT scans etc; "I've got a lot of arthritis" "I'm nothing but skin & bones" "I'm a grown woman & know what hurts me" and she is c/o LLQ/ left side/ left flank pain; "My skin is hanging"; Tiffeny still does her own meds despite what appears to be a mild dementia...    In the interval betw visits Epic indicates that Arlyss has been c/o left hip pain, prev eval by Heather [name] for Ortho, c/o leg pain & swelling w/ PCP at [name] placing her on Lasix, Aldactone, Coreg for this and HBP, diastolic CHF (she  has seen DrNishan in the past); she has had several visits w/ ER, PCP, ?[name] regarding her left sided abd pain; she switched PCP to [place] their notes are reviewed...  We reviewed the following medical problems during today's office visit >>     AB> she c/o cough "hoking little white balls"; baseline CXRs have been clear (old right rib fxs), PFT in 2013 was wnl, oxygenation is wnl, and exam is clear; she has been asked to use Mucinex Qid but she won't do it...    Abn CT scan w/ scat pulm nodules> prev CT Chest w/ tiny left apical opac, unchanged serially & felt to be benign; CT Abd done 05/17/15 w/ tiny 3-30mm RML nodules detected, otherw neg & serial scans showed a 97mm RML granuloma otherw NEG & no progressive lung changes...    HxCP> on Coreg3.125Bid & Lasix40; BP=110/60 & she has a pos ROS; followed by Heather [name]/ [name] for Cards & their prev notes are reviewed=> neg ischemic work up previously...     [name]- HH, Gastirits, Divertics, chronic pain & "swelling"> on Protonix40Bid plus Zantac, Miralax & Senakot-S, etc; she persists w/ mult [name] complaints despite prev evals by [facility], [facility], [facility]; most recent testing 09/2015 by Heather [name]- see imaging studies below;  ENT eval by DrJRosen...    Mult Medical Issues> now followed by PCP-CNche, but she has switched several times... EXAM reveals Afeb, VSS,  O2sat=99% on RA;  HEENT- neg x sl dry MMs;  Chest- she has a forced dry hacking cough that is nonproductive, clear w/o w/r/r;  Heart- RR w/o m/r/g heard;  Abd- soft, nontender, neg...  IMP/PLAN>>  Faryn remains stable from the pulmonary standpoint, her CC remains her enigmatic left sided abd discomfort & "knot"; I hav erec that she continue her Mucinex 600mg  Qid w/ fluids; pulmonary follow up can be prn...      Note:  >50% of this 58min rov was spent in counseling & coordination of care...          Problem List:  RESPIRATORY SYMPTOMS >> c/o "hocking phlegm" DYSPNEA (ICD-786.9) - eval 5/11 c/o SOB- can't get a DB, can't get enough air in, etc... occurs at rest & w/ exerc "I'm very active despite my arthritis", talks rapidly in long sentences w/o apparent distress... no cough, phlegm, hemoptysis, etc- but as usual she is quite distressed by these symptoms and wants further eval> we discussed re-assessment w/ CXR (clear, NAD);  PFT (totally norm airflow);  Labs (all WNL);  Rx w/ ALPRAZOLAM 0.5mg  Tid & encouraged to take regularly. ~  CXR 4/13 showed norm heart size, clear lungs, mild TSpine spondylosis... ~  She continues to complain of drainage, throat symptoms, etc; Rx w/ Mucinex, OTC antihist, Nasonex, etc;  Seen by ENT, DrRosen w/ ?xerostomia & rec incr water etc. ~  9/13: Add-on visit for "hocking, it's not a cough, it's a hock"> see above... ~  9/13: CXR shows sl overinflation, clear lungs, normal heart size w/ elongation & tortuous Ao;  PFTs are wnl> FVC=2.62 (128%), FEV1=2.20 (158%), %1sec=84; mid-flows=274% of predicted... We discussed trial rx for her symptoms- Mucinex, Fluids, Hycodan, Dulera100- 1puffbid. ~  10/13: she reports [facility] to [facility] w/ throat & mouth symptoms "like glue" therefore stopped this med; advised to continue the others. ~  3/14:  CT Chest (done after call from Ortho PA regarding her symptoms)=> then CTA (done via an ER visit) showed no PE, no acute findings,  +atherosclerotic changes, several scattered tiny nodules 3-69mm  size & no change from 2009 scan, s/p left mastectomy, DJD Tspine...  ~  4/14: she is asked to increase the Mucinex600mg  to [facility] + plenty of water daily... ~  10/14: CXR showed normal heart size w/ tortuous atherosclerotic Ao, clear lungs w/ ?vague opac left mid zone adjacent to hilum (nothing seen in this area on CT 3/14) ?overlying shadows & we will f/u...  ~  CXR 04/04/14 shows stable heart size, atherosclerotic & tortuous Ao, mild hyperinflation, clear lungs w/o acute changes, DJD Tspine... ~  8/15 & 11/15> she remains stable from the pulmonary standpoint on Mucinex600Qid + Fluids; rec to follow better antireflux regimen for her cough, LPR, choking...  ~  CXR 03/22/15 showed stable cardiomeg, chronic changes, old right rib fxs, NAD ~  She went to the ER 05/17/15 w/ [name] complaints and the ER physician ordered a CT Abd & Pelvis> a few tiny 3-32mm nodules were noted in the lateral aspect of the RML, the abd revealed aortoiliac calcif w/o aneurysm, right renal cyst, otherw no signif abn noted (Radiology rec f/u CT Chest in 55yr); she is very concerned and I indicated that I thought this was prob some scar tissue but we would recheck her CXR in 29mo & follow up the CT in 44yr.Marlana Salvage HAS ESTABLISHED w/ Heather [name] from [facility] at [facility] (prev Heather [name] at [name])  HYPERTENSION (ICD-401.9) -  ~  on AMLODIPINE 2.5mg /d,  COREG 3.125mg Bid,  LASIX 40mg - ?taking 1/2tab/d or just using it prn swelling... ~  8/16: her meds have been adjusted> on Coreg3.125Bid, Lasix40, off Amlod; BP= 106/60 & she persists w/ mult somatic complaints; last 2DEcho 10/2009 showed norm LV size & func w/ EF=55-60%, mildAI.  Hx of CHEST PAIN (ICD-786.50) - she takes ASA 81mg /d + Tylenol Prn... followed by Heather [name] for Cards. ~  Baseline EKG w/ RBBB & LAD, no acute changes... ~  NuclearStressTest done 2007 & 2009 showed normal- without scar or ischemia, EF=67%...   ~  2DEcho 7/08  showed mild AI, mild diastolic dysfunction, SH=70-26%. ~  CT Abd 1/09 & 9/12 showed incidental coronary calcif, & aorto-iliac atherosclerotic changes as well... ~  8/13: she saw DrNishan> HBP, DD, atypCP w/ neg Myoviews & no changes made... ~  10/13:  She uses a back brace/ abd binder to help her CWP... ~  EKG 9/15 showed NSR, rate81, LAD, RBBB, NSSTTWA, NAD...  VENOUS INSUFFICIENCY (ICD-459.81) - she has mod VI and follows a low sodium diet, elevates legs when able, and wears support hose. ~  7/12:  C/o incr edema & we reviewed a 2gm sodium diet & increased her Lasix from 20mg /d to 40mg /d... ~  5/13:  She has persist complaints but mild chr VI w/ min edema & reminded to elim sodium, elev, support hose, take the Lasix... ~ 11/13: she went to the ER w/ edema; VDopplers were neg for DVT; they reiterated the recs for no salt, elevation, support hose, & continue Lasix40. ~  She has persistent edema in LEs and rec to avoid sodium, elev legs, wear support hose...  HIATAL HERNIA (ICD-553.3) & GASTRITIS (ICD-535.50) >>  ~  on OMEPRAZOLE 20mg /d & followed by Heather [name] for [name] having fired Heather [name] & [facility] in past. ~  last EGD was 2/08 w/ 3cm HH & gastitis... ~  CT Abd 1/09 revealed calcif gallstones, & mild ectasia of AA... ~  CT Abd 1/11 showed left colon divertics, tiny hepatic lesions w/o change & right renal cyst stable. ~  CT Abd  9/12 showed divertics (no inflamm), right renal & left hep lobe cysts, atherosclerotic changes, NAD... ~  Seen by DrEdwards 4/13> c/o dysphagia & congestion; he notes mult [name] complaints, known esoph dysmotility w/o stricture, hard to pin down her complaints... ~  She has followed up w/ Heather [name] regarding her concerns about swelling in her abd... ~  CT Abd Pelvis 3/14 showed Tiny cysts in the lateral segment left hepatic lobe, 1.4 cm right upper pole renal cyst, atherosclerotic calcifications of the abdominal aorta and branch vessels, DJD spine... ~  2015: now on Protonix40Bid  but not taking regularly & advised BID dosing & antireflux regimen for her LPR...   DIVERTICULOSIS, COLON (ICD-562.10) - followed regularly by Heather [name]. ~  colonoscopy 7/03 by Heather [name] showing divertics only... we discussed Miralax + Senakot-S for constipation. ~  colonoscopy 2/11 showed divertics, 53mm polyp, hems...  ? of ABDOMINAL PAIN & SYMPTOMATOLOGY - she had a very thorough work up in 2009> she has a cancer phobia and is very difficult to reassure her that everything appropriate has been done & no cancer found... Mult additional [name] evals since then from [facility] [name], [name] (DrEdwards), [name] (DrThorne)... ~  eval by [name]- Heather [name] 12/08 but pt wasn't satisfied w/ his examination... ~  full labs 1/09 were WNL, sed=20... ~  CT Abd/ Pelvis 1/09= no acute abn in abd (calcif gallstones & mild ectasia of AA), calcif fibroid in the pelvis, & ?regarding the left ovary. ~  referral to DrFontaine, GYN> his notes are reviewed... ~  follow up w/ Heather [name], Urology> his notes are reviewed... ~  eval Heather [name] for Orthopedics- rec shots in back by [name] w/ some improvement. ~  repeat [name] eval 1/11 by Heather [name] w/ CT & colonoscpy as above- no acute problems. ~  5/11 & 7/11 >> persist complaints w/ neg recheck by Heather [name] & Heather [name]... ~  12/11:  routine GYN surveillance DrFontaine found anteverted uterus w/ fibroids, atrophic ovaries bilat, no mass. ~  Repeat [name] eval 4/12 by Heather [name]> he tried to reassure the pt, felt some symptoms might be from poor abd wall muscle tone, rec continue Omep, laxatives... ~  6/12:  She sought another [name] opinion DrEdwards> he did not feel she needed any additional testing... ~  She tells me she has yet another appt to see a gastroenterologist at [facility]==> seen by DrThorne 8/12 & note reviewed... ~  EGD by DrThorne 9/12 w/ mult whitish nodular plaques in cardia & fundus> Bx results pending & she is very concerned (remember her hx cancer phobia). ~  She continues to f/u w/  DrThorne at [name] & is happy w/ her eval... ~  Prev back w/ Heather [name] at [name] => then switched to DrEdwards at [place], [name].Marland Kitchen.  ~  CT Abd & Pelvis 05/17/15 via ER showed a few tiny 3-39mm nodules were noted in the lateral aspect of the RML, the abd revealed aortoiliac calcif w/o aneurysm, right renal cyst, otherw no signif abn noted (Radiology rec f/u CT Chest in 85yr).  RENAL CYST (ICD-593.2) - she has a 1 cm right renal cyst seen on CT in 2007 and followed by Heather [name]... f/u CT Abd 1/09 - no change in the cyst... f/u renal ultrasound 1/10 w/ small bilat cysts... f/u CT Abd 1/11- no change in cyst. ~  she saw Heather [name] 7/11 for LBP, microhematuria, urge incontinence- stable, no additional Rx. ~  she saw [place] 1/12 for urge incontinence, freq, nocturia> rec Desmopressin 0.2mg - 1/2 tab Qhs. ~  No change in the renal cyst over the yrs...  CARCINOMA, BREAST, LEFT (ICD-174.9) - Surgery 9/94 by DrBlievernicht w/ Left modified radical mastectomy... +estrogen receptors and Rx w/ Tamoxifen... f/u mammograms all neg... breast exam on right has been neg- no nodules palpated... She says that DrBertrand insisted that her PCP check her right breast at each & every office visit to be sure she doesn't develop any nodules...  DEGENERATIVE JOINT DISEASE (ICD-715.90) - on OTC meds + VICODIN Prn... she has end-stage OA of Right Knee per Heather [name] treated w/ shots in the past... she has a knee brace and a cane for ambulation... ~  8/13:  She wants a better pain pill & we discussed change to [place] prn... ~  12/13: she tells me that Heather [name] injected her knee last wk- improved...  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 Rx w/ rest, heat, Percocet, and f/u w/ Ortho...  Hx of LOW BACK PAIN, CHRONIC (ICD-724.2) - eval Heather [name] w/ rec for shots by Heather [name] & she's feeling sl better & wears back brace... ~  5/10: mult  somatic complaints- primarily c/o left side/ postero-lat rib pain...  Exam w/ tender left lat/ post ribs... Bone Scan= neg... ~  MRI Lumbar spine 7/10 by Heather [name]... ~  Epidural steroid shot by Heather [name] 8/10 & 1/11 ~  Repeat LBP eval by Heather [name] w/ another MRI lumbar spine showing degenerative changes w/ mild progression since 7/10 MRI, mild-mod sp stenosis & lat recess stenosis at several levels ~  Heather [name] referred her to [facility] to consider more shots but she is not so inclined... ~  6/13: she saw DrTooke w/ lumbar spondylosis; he felt that he had nothing to offer her & rec incr Tramadol to Qid... ~  2014:  She has had numerous visits w/ Heather [name]/ [facility] Patches for injections, XRays, scans etc...  CARPAL TUNNEL SYNDROME, BILATERAL (ICD-354.0) - she had prev right carpal tunnel release by Heather [name] and was c/o increasing discomfort in her left wrist despite wrist splint Qhs- had left carpal tunnel release 1/10 by Heather [name]...  ANXIETY (ICD-300.00) - on ALPRAZOLAM 0.5mg  Tid & encouraged to take it regularly... she has a cancer phobia & it is very difficult to reassure her regarding her symptoms and our evaluations... ~  7/13: she went to the ER c/o swelling of the tongue but eval by EDP indicated that her tongue was normal & not swollen, she was not on an ACE, given Pred Rx anyway. ~  She has been asked to incr the ALprazolam to regular dosing but she is reluctant...   Past Surgical History:  Procedure Laterality Date  . CARPAL TUNNEL RELEASE    . Left mastectomy      Outpatient Encounter Prescriptions as of 06/01/2017  Medication Sig  . ALPRAZolam (XANAX) 0.25 MG tablet Take 1 tablet (0.25 mg total) by mouth at bedtime as needed for anxiety or sleep.  . carvedilol (COREG) 3.125 MG tablet Take 1 tablet (3.125 mg total) by mouth 2 (two) times daily.  . Cholecalciferol (VITAMIN D3) 1000 units CAPS Take 1 capsule (1,000 Units total) by mouth daily. For your bones  . feeding  supplement, ENSURE ENLIVE, (ENSURE ENLIVE) LIQD Take 237 mLs by mouth 2 (two) times daily between meals.  . furosemide (LASIX) 20 MG tablet Take 1 tablet (20 mg total) by mouth daily.  Marland Kitchen levocetirizine (XYZAL) 5 MG tablet Take 1 tablet (5 mg total) by mouth every evening.  Marland Kitchen  Multiple Vitamins-Minerals (MULTIVITAMINS THER. W/MINERALS) TABS Take 1 tablet by mouth daily.  . pantoprazole (PROTONIX) 40 MG tablet Take 1 tablet (40 mg total) by mouth 2 (two) times daily.  . polyethylene glycol powder (GLYCOLAX/MIRALAX) powder Take 17 g by mouth daily as needed.  Marland Kitchen spironolactone (ALDACTONE) 25 MG tablet Take 1 tablet (25 mg total) by mouth daily.  . meloxicam (MOBIC) 7.5 MG tablet Take 1 tablet (7.5 mg total) by mouth daily.   No facility-administered encounter medications on file as of 06/01/2017.     Allergies  Allergen Reactions  . Fentanyl Other (See Comments)    REACTION: nausea - pt states that she is not allergic  REACTION: nausea - pt states that she is not allergic     Current Medications, Allergies, Past Medical History, Past Surgical History, Family History, and Social History were reviewed in [employer] record.    Review of Systems       See HPI - all other systems neg except as noted... She has mult somatic complaints and anxiety. The patient complains of decreased hearing, chest discomfort, dyspnea on exertion, abdominal pain, incontinence, muscle weakness, and difficulty walking.  The patient denies anorexia, fever, vision loss, hoarseness, syncope, peripheral edema, prolonged cough, headaches, hemoptysis, melena, hematochezia, severe indigestion/heartburn, hematuria, transient blindness, depression, abnormal bleeding, enlarged lymph nodes, and angioedema.     Objective:   Physical Exam    WD, WN, 83 y/o BF in NAD... she is very anxious... GENERAL:  Alert & oriented; pleasant & cooperative... HEENT:  [place]/AT, EOM-wnl, PERRLA, EACs-clear, TMs-wnl,  NOSE-clear, THROAT-clear & wnl, no lesions seen. NECK:  Neck ROM decreased, no JVD; normal carotid impulses w/o bruits; no thyromegaly or nodules palpated; no lymphadenopathy. CHEST:  Clear to P & A; without wheezes/ rales/ or rhonchi heard... BREAST:  s/p left mastectomy... right breast normal- without nodules palpated... HEART:  Regular Rhythm; without murmurs/ rubs/ or gallops detected... ABDOMEN:  Soft & nontender; normal bowel sounds; no organomegaly or masses palpated... EXT: without deformities, mod arthritic changes; no varicose veins/ +venous insuffic/ tr edema. NEURO:  CN's intact; no focal neuro deficits... DERM:  Small sl excoriated area of dermatitis left shin- no drainage,no rash, etc...  RADIOLOGY DATA:  Reviewed in the EPIC EMR & discussed w/ the patient...  LABORATORY DATA:  Reviewed in the EPIC EMR & discussed w/ the patient...   Assessment & Plan:    RESPIRATORY SYMPTOMS w/ Dyspnea & throat clearing, prob LPR>  She had a neg pulm eval 2011 and numerous times thereafter; she insisted on additional eval 9/13 for her chr throat clearing symptom; CXR w/o acute changes & PFTs normal for her 83 y/o lungs; she is quite insistent on med rx for her problem but has not had relief from anything; REC to take [id number], Fluids, DELSYM prn; pt very anxious & Alprazolam seems to help when she takes it... 11/15> she has been taking the OTC MUCINEX 600mg  Qid w/ fluids and seems sl improved; rec strict antireflux regimen w/ Protonix before dinner, NPO after dinner, elev HOB 6" blocks... 5/16 - 8/16> she is once again off all these meds and c/o incr symptoms; rec to restart the Mucinex, fluids, and antireflux regimen... 01/17/16>   Killian is stable from the pulm standpoint;  She has mult somatic complaints & a large physician contingent attending to her various symptoms;  I suggested to her that she would be best served by taking the Xanax regularly 0.5mg - 1/2 to 1 tab Tid regularly (  she  never did). 05/28/16>   Masa appears stable- chest is clear, oxygenation excellent, and she presents w/ the same mult somatic complaints; she still won't try the Xanax as I've suggested; f/u w/ me as needed... 12/01/16>   Kimball is stable from there pulmonary standpoint; encouraged to use Mucinex & drink plenty of water; needs to be careful & avoid falling! 06/01/17>   Jesseca remains stable from the pulmonary standpoint, her CC remains her enigmatic left sided abd discomfort & "knot"; I hav erec that she continue her Mucinex 600mg  Qid w/ fluids; pulmonary follow up can be prn...   Abn CT Abd w/ several tiny nodules reported in the RML area>  We discussed this & decided to f/u CXR in 19mo & CT Chest in 3yr...   Hx CP> evaluated for Cards by DrBensimon & he has not been in favor of invasive testing; stable on conservative med rx...  [name]> HH, Gastritis, Divertics, vague abd pain>  I tried once again to reassure her about the thorough [name] evaluations that her has undergone; she is happy w/ DrThorne's eval & will f/u w/ her & in the meanwhile continue rx w/ [id number], Miralax, Metamucil, Senakot, Mylicon, etc... She has re-established w/ [place] [name]- their notes reviewed...  Hx left Breast Cancer>  No known recurrence...  Anxiety>  Asked once again to increase the Alprazolam to 1/2 - 1 tab TID regularly...   Patient's Medications  New Prescriptions   MELOXICAM (MOBIC) 7.5 MG TABLET    Take 1 tablet (7.5 mg total) by mouth daily.  Previous Medications   ALPRAZOLAM (XANAX) 0.25 MG TABLET    Take 1 tablet (0.25 mg total) by mouth at bedtime as needed for anxiety or sleep.   CARVEDILOL (COREG) 3.125 MG TABLET    Take 1 tablet (3.125 mg total) by mouth 2 (two) times daily.   CHOLECALCIFEROL (VITAMIN D3) 1000 UNITS CAPS    Take 1 capsule (1,000 Units total) by mouth daily. For your bones   FEEDING SUPPLEMENT, ENSURE ENLIVE, (ENSURE ENLIVE) LIQD    Take 237 mLs by mouth 2 (two) times daily between  meals.   FUROSEMIDE (LASIX) 20 MG TABLET    Take 1 tablet (20 mg total) by mouth daily.   LEVOCETIRIZINE (XYZAL) 5 MG TABLET    Take 1 tablet (5 mg total) by mouth every evening.   MULTIPLE VITAMINS-MINERALS (MULTIVITAMINS THER. W/MINERALS) TABS    Take 1 tablet by mouth daily.   PANTOPRAZOLE (PROTONIX) 40 MG TABLET    Take 1 tablet (40 mg total) by mouth 2 (two) times daily.   POLYETHYLENE GLYCOL POWDER (GLYCOLAX/MIRALAX) POWDER    Take 17 g by mouth daily as needed.   SPIRONOLACTONE (ALDACTONE) 25 MG TABLET    Take 1 tablet (25 mg total) by mouth daily.  Modified Medications   No medications on file  Discontinued Medications   No medications on file

## 2017-06-07 ENCOUNTER — Encounter (HOSPITAL_COMMUNITY): Payer: Self-pay | Admitting: Emergency Medicine

## 2017-06-07 ENCOUNTER — Ambulatory Visit (HOSPITAL_COMMUNITY)
Admission: EM | Admit: 2017-06-07 | Discharge: 2017-06-07 | Disposition: A | Discharge status: Home / Self Care | Payer: Medicare Other | Attending: Internal Medicine | Admitting: Internal Medicine

## 2017-06-07 DIAGNOSIS — M1612 Unilateral primary osteoarthritis, left hip: Secondary | ICD-10-CM

## 2017-06-07 DIAGNOSIS — M25552 Pain in left hip: Secondary | ICD-10-CM | POA: Diagnosis not present

## 2017-06-07 NOTE — ED Triage Notes (Signed)
Patient has had abdominal pain for 8-9 months and reports having seen 2-3 doctors.  Patient is seeing a new doctor this coming Friday.

## 2017-06-07 NOTE — ED Provider Notes (Signed)
CSN: [id number]     Arrival date & time 06/07/17  1649 History   First MD Initiated Contact with Patient 06/07/17 1813     Chief Complaint  Patient presents with  . Abdominal Pain   (Consider location/radiation/quality/duration/timing/severity/associated sxs/prior Treatment) 83 year old female who is fully alert and oriented, "sharp as a ttack" presents to the urgent care with complaints of left groin, left lower abdominal pain for over a year. She has seen several doctors, and had 2 CT scans and more recently x-rays. She has moderate severe hip arthropathy and degenerative joint disease. She states she relies she has this but believes there is probably something else up in the abdomen that is bothering her as well. She is very loquacious and has recalled much of her past medical history in detail including her present history. She continued to talk during the entire time she was here. During that time she talked herself out of going through any other exams or testing that it is unlikely that we would find out what is going on if no one else has. She states that she has an appointment coming up in a few days with another doctor. She states are new changes. She is currently taking an NSAID which is modest and its effect. All talking she stood up and paint me for listening to her and started walking out of the room. She continued to be thankful and pleasant but apparently just talked herself out of staying. I was able to palpate the anterior hip, left groin and abdomen however she states he you are not going to feel anything there nobody else has yet either. She states that she was sorry she wasted every once time and began to walk out. Asked her to stay and I will try to examine her a little more closely but she said that she would just wait and see her doctor next week.      Past Medical History:  Diagnosis Date  . Allergic rhinitis, cause unspecified 02/21/2014  . Anxiety state, unspecified   .  Breast cancer ([place])    NO STICK OR BLOOD PRESSURE CHECKS IN LEFT ARM  . Carpal tunnel syndrome   . Cervical spondylosis   . Colon polyp 12/19/2009   Transverse-polypoid colorectal mucosa  . Constipation   . Degenerative joint disease   . Diastolic dysfunction   . Diverticula of colon   . Gastritis, chronic   . Hiatal hernia   . Hypertension   . Low back pain   . RBBB (right bundle branch block with left anterior fascicular block)   . Renal cyst   . Venous insufficiency    Past Surgical History:  Procedure Laterality Date  . CARPAL TUNNEL RELEASE    . Left mastectomy     Family History  Problem Relation Age of Onset  . Colon cancer Neg Hx    Social History  Substance Use Topics  . Smoking status: Never Smoker  . Smokeless tobacco: Never Used  . Alcohol use No   OB History    No data available     Review of Systems  Constitutional: Positive for activity change.  Respiratory: Negative.   Cardiovascular: Negative.   Genitourinary: Positive for enuresis.  Musculoskeletal: Positive for arthralgias.  Neurological: Negative.     Allergies  Fentanyl  Home Medications   Prior to Admission medications   Medication Sig Start Date End Date Taking? Authorizing Provider  ALPRAZolam (XANAX) 0.25 MG tablet Take 1 tablet (0.25 mg total)  by mouth at bedtime as needed for anxiety or sleep. 05/21/17   Donnamae Jude, MD  carvedilol (COREG) 3.125 MG tablet Take 1 tablet (3.125 mg total) by mouth 2 (two) times daily. 05/21/17   Donnamae Jude, MD  Cholecalciferol (VITAMIN D3) 1000 units CAPS Take 1 capsule (1,000 Units total) by mouth daily. For your bones 05/21/17   Donnamae Jude, MD  feeding supplement, ENSURE ENLIVE, (ENSURE ENLIVE) LIQD Take 237 mLs by mouth 2 (two) times daily between meals. 09/30/16   Nche, Charlene Brooke, NP  furosemide (LASIX) 20 MG tablet Take 1 tablet (20 mg total) by mouth daily. 05/21/17   Donnamae Jude, MD  levocetirizine (XYZAL) 5 MG tablet Take 1 tablet (5  mg total) by mouth every evening. 05/21/17   Donnamae Jude, MD  meloxicam (MOBIC) 7.5 MG tablet Take 1 tablet (7.5 mg total) by mouth daily. 06/01/17   Noralee Space, MD  Multiple Vitamins-Minerals (MULTIVITAMINS THER. W/MINERALS) TABS Take 1 tablet by mouth daily.    [provider]  pantoprazole (PROTONIX) 40 MG tablet Take 1 tablet (40 mg total) by mouth 2 (two) times daily. 05/21/17   Donnamae Jude, MD  polyethylene glycol powder (GLYCOLAX/MIRALAX) powder Take 17 g by mouth daily as needed. 05/21/17   Donnamae Jude, MD  spironolactone (ALDACTONE) 25 MG tablet Take 1 tablet (25 mg total) by mouth daily. 05/21/17   Donnamae Jude, MD   Meds Ordered and Administered this Visit  Medications - No data to display  BP 126/71 (BP Location: Right Arm)   Pulse 76   Temp 98.4 F (36.9 C) (Oral)   Resp 20   SpO2 96%  No data found.   Physical Exam  Constitutional: She is oriented to person, place, and time. She appears well-developed and well-nourished. No distress.  Eyes: EOM are normal.  Neck: Neck supple.  Cardiovascular: Normal rate.   Pulmonary/Chest: Effort normal. No respiratory distress.  Musculoskeletal: She exhibits no edema.  Able to sit and stand without assistance. She is ambulatory with a walker. Palpation of the left hip and groin produces some tenderness however she states there is a pain approximately 4 cm above that. Palpation of that area did not elicit any pain. She states that the pain only occurs when she is walking.  Neurological: She is alert and oriented to person, place, and time. She exhibits normal muscle tone.  Skin: Skin is warm and dry.  Psychiatric: She has a normal mood and affect.  Nursing note and vitals reviewed.   Urgent Care Course     Procedures (including critical care time)  Labs Review Labs Reviewed - No data to display  Imaging Review No results found.   Visual Acuity Review  Right Eye Distance:   Left Eye Distance:   Bilateral  Distance:    Right Eye Near:   Left Eye Near:    Bilateral Near:         MDM   1. Left hip pain   2. Primary osteoarthritis of left hip    The patient stated she did not feel like she wanted to go through another examination and stated she did not need any more x-rays. She will take the pain medicine she was given previously and see her doctor next week.    Janne Napoleon, NP 06/07/17 [phone]

## 2017-06-07 NOTE — ED Notes (Signed)
Patient discharged by provider.

## 2017-06-10 ENCOUNTER — Ambulatory Visit (INDEPENDENT_AMBULATORY_CARE_PROVIDER_SITE_OTHER): Payer: Medicare Other | Admitting: Family Medicine

## 2017-06-10 ENCOUNTER — Encounter: Payer: Self-pay | Admitting: Family Medicine

## 2017-06-10 VITALS — BP 118/62 | HR 85 | Temp 98.2°F | Ht 62.0 in | Wt 117.4 lb

## 2017-06-10 DIAGNOSIS — M545 Low back pain, unspecified: Secondary | ICD-10-CM

## 2017-06-10 DIAGNOSIS — G8929 Other chronic pain: Secondary | ICD-10-CM

## 2017-06-10 MED ORDER — MELOXICAM 7.5 MG PO TABS: 7.5000 mg | ORAL_TABLET | Freq: Every day | ORAL | 0 refills | Status: DC

## 2017-06-10 MED ORDER — BACLOFEN 10 MG PO TABS: 10.0000 mg | ORAL_TABLET | Freq: Three times a day (TID) | ORAL | 0 refills | Status: DC

## 2017-06-10 NOTE — Progress Notes (Signed)
   Subjective:   Patient ID: Heather [name]    DOB: 1917/12/21, 83 y.o. female   MRN: [id number]  CC: "Back pain"  HPI: Heather [name] is a 83 y.o. female who presents to clinic today for back pain. Problems discussed today are as follows:  Back pain: Chronic issue which has recently been more aggravating with a 9/10 throbbing sensation on left back. Patient was previously given meloxicam for a few days with some improvement but pain persists. She continues to use a walker. No history of falls or trauma to her hip or back. ROS: Denies fevers or chills, saddle anesthesia, bowel or bladder incontinence.  Complete ROS performed, see HPI for pertinent.  [place]: HTN, HFpEF (EF 55-65%, 2010), h/o breast cancer (1994, s/p L-mastectomy), renal cyst. Smoking status reviewed. Medications reviewed.  Objective:   BP 118/62   Pulse 85   Temp 98.2 F (36.8 C) (Oral)   Ht 5\' 2"  (1.575 m)   Wt 117 lb 6.4 oz (53.3 kg)   SpO2 98%   BMI 21.47 kg/m  Vitals and nursing note reviewed.  General: frail elderly female, emaciated, well developed, in no acute distress with non-toxic appearance HEENT: normocephalic, atraumatic, moist mucous membranes CV: regular rate and rhythm without murmurs, rubs, or gallops, no lower extremity edema Lungs: clear to auscultation bilaterally with normal work of breathing Abdomen: soft, non-tender, non-distended, no masses or organomegaly palpable, normoactive bowel sounds Skin: warm, dry, no rashes or lesions, cap refill < 2 seconds Extremities: warm and well perfused, normal tone MSK: gait slow but steady, significantly tender SI joint on left side  Assessment & Plan:   Lumbago Chronic. Seems to be more related to left SI joint. No red flags concerning for malignancy. Likely sacroiliitis or back strain given unilateral location. --Will give trial of Mobic 7.5 mg daily for 30 tablets and baclofen 10 mg tablet 3 times daily with 30 tablets  No orders of the  defined types were placed in this encounter.  Meds ordered this encounter  Medications  . meloxicam (MOBIC) 7.5 MG tablet    Sig: Take 1 tablet (7.5 mg total) by mouth daily.    Dispense:  30 tablet    Refill:  0  . baclofen (LIORESAL) 10 MG tablet    Sig: Take 1 tablet (10 mg total) by mouth 3 (three) times daily.    Dispense:  30 each    Refill:  0    Heather [name], Heather [name], PGY-2 06/11/2017 11:35 AM

## 2017-06-10 NOTE — Patient Instructions (Signed)
Thank you for coming in to see [place] today. Please see below to review our plan for today's visit.  1. I have given you a prescription for pain all mobile. Please take 1 tablet as needed for pain. Have also prescribed him medication called baclofen for muscle relaxing.   Return to clinic in 6 months.  Please call the clinic at [phone] if your symptoms worsen or you have any concerns. It was my pleasure to see you. -- [name], [place], PGY-2

## 2017-06-10 NOTE — Assessment & Plan Note (Addendum)
Chronic. Seems to be more related to left SI joint. No red flags concerning for malignancy. Likely sacroiliitis or back strain given unilateral location. --Will give trial of Mobic 7.5 mg daily for 30 tablets and baclofen 10 mg tablet 3 times daily with 30 tablets

## 2017-06-15 ENCOUNTER — Ambulatory Visit: Payer: Medicare Other | Admitting: Nurse Practitioner

## 2017-06-22 DIAGNOSIS — N1330 Unspecified hydronephrosis: Secondary | ICD-10-CM | POA: Diagnosis not present

## 2017-06-22 DIAGNOSIS — R1032 Left lower quadrant pain: Secondary | ICD-10-CM | POA: Insufficient documentation

## 2017-06-22 DIAGNOSIS — N3000 Acute cystitis without hematuria: Secondary | ICD-10-CM | POA: Diagnosis not present

## 2017-06-22 DIAGNOSIS — I10 Essential (primary) hypertension: Secondary | ICD-10-CM | POA: Diagnosis not present

## 2017-06-22 DIAGNOSIS — Z79899 Other long term (current) drug therapy: Secondary | ICD-10-CM | POA: Insufficient documentation

## 2017-06-22 DIAGNOSIS — R300 Dysuria: Secondary | ICD-10-CM | POA: Insufficient documentation

## 2017-06-23 ENCOUNTER — Emergency Department (HOSPITAL_COMMUNITY): Payer: Medicare Other

## 2017-06-23 ENCOUNTER — Emergency Department (HOSPITAL_COMMUNITY)
Admission: EM | Admit: 2017-06-23 | Discharge: 2017-06-23 | Disposition: A | Discharge status: Home / Self Care | Payer: Medicare Other | Attending: Emergency Medicine | Admitting: Emergency Medicine

## 2017-06-23 ENCOUNTER — Encounter (HOSPITAL_COMMUNITY): Payer: Self-pay | Admitting: Emergency Medicine

## 2017-06-23 DIAGNOSIS — N1330 Unspecified hydronephrosis: Secondary | ICD-10-CM | POA: Diagnosis not present

## 2017-06-23 DIAGNOSIS — R1032 Left lower quadrant pain: Secondary | ICD-10-CM

## 2017-06-23 DIAGNOSIS — R109 Unspecified abdominal pain: Secondary | ICD-10-CM

## 2017-06-23 DIAGNOSIS — N3000 Acute cystitis without hematuria: Secondary | ICD-10-CM

## 2017-06-23 LAB — COMPREHENSIVE METABOLIC PANEL
ALBUMIN: 3.9 g/dL (ref 3.5–5.0)
ALT: 21 U/L (ref 14–54)
ANION GAP: 9 (ref 5–15)
AST: 27 U/L (ref 15–41)
Alkaline Phosphatase: 32 U/L — ABNORMAL LOW (ref 38–126)
BUN: 20 mg/dL (ref 6–20)
CHLORIDE: 108 mmol/L (ref 101–111)
CO2: 27 mmol/L (ref 22–32)
Calcium: 9.3 mg/dL (ref 8.9–10.3)
Creatinine, Ser: 0.84 mg/dL (ref 0.44–1.00)
GFR calc Af Amer: >60 mL/min (ref >60)
GFR calc non Af Amer: 56 mL/min — ABNORMAL LOW (ref >60)
GLUCOSE: 97 mg/dL (ref 65–99)
POTASSIUM: 3.8 mmol/L (ref 3.5–5.1)
SODIUM: 144 mmol/L (ref 135–145)
Total Bilirubin: 0.8 mg/dL (ref 0.3–1.2)
Total Protein: 6.6 g/dL (ref 6.5–8.1)

## 2017-06-23 LAB — URINALYSIS, ROUTINE W REFLEX MICROSCOPIC
BACTERIA UA: NONE SEEN
BILIRUBIN URINE: NEGATIVE
Glucose, UA: NEGATIVE mg/dL
KETONES UR: NEGATIVE mg/dL
NITRITE: NEGATIVE
PROTEIN: NEGATIVE mg/dL
SPECIFIC GRAVITY, URINE: 1.014 (ref 1.005–1.030)
pH: 6 (ref 5.0–8.0)

## 2017-06-23 LAB — CBC WITH DIFFERENTIAL/PLATELET
BASOS PCT: 0 %
Basophils Absolute: 0 10*3/uL (ref 0.0–0.1)
EOS ABS: 0.1 10*3/uL (ref 0.0–0.7)
Eosinophils Relative: 2 %
HCT: 40.5 % (ref 36.0–46.0)
Hemoglobin: 13.4 g/dL (ref 12.0–15.0)
Lymphocytes Relative: 49 %
Lymphs Abs: 2.4 10*3/uL (ref 0.7–4.0)
MCH: 30.6 pg (ref 26.0–34.0)
MCHC: 33.1 g/dL (ref 30.0–36.0)
MCV: 92.5 fL (ref 78.0–100.0)
Monocytes Absolute: 0.5 10*3/uL (ref 0.1–1.0)
Monocytes Relative: 11 %
Neutro Abs: 1.8 10*3/uL (ref 1.7–7.7)
Neutrophils Relative %: 38 %
PLATELETS: 182 10*3/uL (ref 150–400)
RBC: 4.38 MIL/uL (ref 3.87–5.11)
RDW: 13.9 % (ref 11.5–15.5)
WBC: 4.8 10*3/uL (ref 4.0–10.5)

## 2017-06-23 MED ORDER — CEPHALEXIN 500 MG PO CAPS: 500.0000 mg | ORAL_CAPSULE | Freq: Three times a day (TID) | ORAL | 0 refills | Status: DC

## 2017-06-23 MED ORDER — CEPHALEXIN 500 MG PO CAPS
500.0000 mg | ORAL_CAPSULE | Freq: Once | ORAL | Status: AC
  Administered 2017-06-23: 500 mg via ORAL
  Filled 2017-06-23: qty 1

## 2017-06-23 NOTE — Discharge Instructions (Signed)
Take the antibiotics until gone. Keep your appointment on Thursday, August 9. Continue your medications.

## 2017-06-23 NOTE — ED Triage Notes (Signed)
Pt reports having left sided flank pain that radiates from groin to left flank area. Pt states that she has been having symptoms for the last several months. Pt reports having burning with urination.

## 2017-06-23 NOTE — ED Notes (Signed)
Discharge information reviewed with patient and son. Both verbalized understanding of abx therapy and follow up care.

## 2017-06-23 NOTE — ED Provider Notes (Signed)
[facility] DEPT Provider Note   CSN: [id number] Arrival date & time: 06/22/17  2257  Time seen 05:10 AM  History   Chief Complaint Chief Complaint  Patient presents with  . Flank Pain    HPI KALILA ADKISON is a 83 y.o. female.  HPI  patient states she's had pain in her left lower abdomen in her left flank area for the past 8-9 months. She states it hurts when she walks, if she sits down it feels better however it has been constant now for the past month. She has been seen by a GYN doctor and was told she had arthritis in her hip. She denies nausea, vomiting, or hematuria. She states she has recently started having dysuria. She denies any fever and does complain of decreased bowel movements although she states she's eating well. It is not clear what the precipitating event was for her ED visit tonight.  PCP [place] Bing, DO GYN Dr Kennon Rounds  Past Medical History:  Diagnosis Date  . Allergic rhinitis, cause unspecified 02/21/2014  . Anxiety state, unspecified   . Breast cancer ([place])    NO STICK OR BLOOD PRESSURE CHECKS IN LEFT ARM  . Carpal tunnel syndrome   . Cervical spondylosis   . Colon polyp 12/19/2009   Transverse-polypoid colorectal mucosa  . Constipation   . Degenerative joint disease   . Diastolic dysfunction   . Diverticula of colon   . Gastritis, chronic   . Hiatal hernia   . Hypertension   . Low back pain   . RBBB (right bundle branch block with left anterior fascicular block)   . Renal cyst   . Venous insufficiency     Patient Active Problem List   Diagnosis Date Noted  . Vitamin D deficiency 05/21/2017  . Abnormal CT scan of lung 07/16/2015  . Esophageal dysmotility 10/27/2014  . Other dysphagia 05/09/2014  . Failure to thrive in adult 04/26/2014  . Arthritis of hand, degenerative 04/10/2014  . Other and unspecified hyperlipidemia 04/10/2014  . LPRD (laryngopharyngeal reflux disease) 04/04/2014  . Allergic rhinitis 02/21/2014  . Abnormal  findings in stool 12/02/2013  . Abdominal pain, left lower quadrant 06/22/2013  . Chronic bronchitis ([place]) 05/30/2013  . Somatization disorder 03/14/2013  . Hx of breast cancer 08/30/2012  . Edema 06/12/2011  . GERD 03/27/2010  . Diastolic CHF ([place]) 24/23/5361  . Constipation 01/18/2009  . Carpal tunnel syndrome on both sides 10/03/2008  . Cervical spondylosis without myelopathy 03/21/2008  . Diaphragmatic hernia 12/26/2007  . Renal cyst 12/26/2007  . Anxiety state 09/15/2007  . Primary hypertension 09/15/2007  . Venous (peripheral) insufficiency 09/15/2007  . Osteoarthritis 09/15/2007  . Lumbago 09/15/2007  . Diverticulosis of colon 05/24/2002    Past Surgical History:  Procedure Laterality Date  . CARPAL TUNNEL RELEASE    . Left mastectomy      OB History    No data available       Home Medications    Prior to Admission medications   Medication Sig Start Date End Date Taking? Authorizing Provider  ALPRAZolam (XANAX) 0.25 MG tablet Take 1 tablet (0.25 mg total) by mouth at bedtime as needed for anxiety or sleep. 05/21/17  Yes Donnamae Jude, MD  baclofen (LIORESAL) 10 MG tablet Take 1 tablet (10 mg total) by mouth 3 (three) times daily. 06/10/17  Yes [place] Bing, DO  carvedilol (COREG) 3.125 MG tablet Take 1 tablet (3.125 mg total) by mouth 2 (two) times daily. 05/21/17  Yes Donnamae Jude, MD  Cholecalciferol (VITAMIN D3) 1000 units CAPS Take 1 capsule (1,000 Units total) by mouth daily. For your bones 05/21/17  Yes Donnamae Jude, MD  feeding supplement, ENSURE ENLIVE, (ENSURE ENLIVE) LIQD Take 237 mLs by mouth 2 (two) times daily between meals. 09/30/16  Yes Nche, Charlene Brooke, NP  furosemide (LASIX) 20 MG tablet Take 1 tablet (20 mg total) by mouth daily. 05/21/17  Yes Donnamae Jude, MD  hydroxypropyl methylcellulose / hypromellose (ISOPTO TEARS / GONIOVISC) 2.5 % ophthalmic solution Place 1 drop into both eyes 3 (three) times daily as needed for dry eyes.   Yes  [provider]  meloxicam (MOBIC) 7.5 MG tablet Take 1 tablet (7.5 mg total) by mouth daily. 06/10/17  Yes [place] Bing, DO  Multiple Vitamins-Minerals (MULTIVITAMINS THER. W/MINERALS) TABS Take 1 tablet by mouth daily.   Yes [provider]  pantoprazole (PROTONIX) 40 MG tablet Take 1 tablet (40 mg total) by mouth 2 (two) times daily. 05/21/17  Yes Donnamae Jude, MD  spironolactone (ALDACTONE) 25 MG tablet Take 1 tablet (25 mg total) by mouth daily. 05/21/17  Yes Donnamae Jude, MD  cephALEXin (KEFLEX) 500 MG capsule Take 1 capsule (500 mg total) by mouth 3 (three) times daily. 06/23/17   Rolland Porter, MD  levocetirizine (XYZAL) 5 MG tablet Take 1 tablet (5 mg total) by mouth every evening. 05/21/17   Donnamae Jude, MD  polyethylene glycol powder (GLYCOLAX/MIRALAX) powder Take 17 g by mouth daily as needed. Patient taking differently: Take 17 g by mouth daily as needed for moderate constipation.  05/21/17   Donnamae Jude, MD    Family History Family History  Problem Relation Age of Onset  . Colon cancer Neg Hx     Social History Social History  Substance Use Topics  . Smoking status: Never Smoker  . Smokeless tobacco: Never Used  . Alcohol use No  lives at home Lives alone Uses a walker   Allergies   Fentanyl   Review of Systems Review of Systems  All other systems reviewed and are negative.    Physical Exam Updated Vital Signs BP 132/72 (BP Location: Right Arm)   Pulse 67   Temp 98.1 F (36.7 C) (Oral)   Resp 14   Ht 5\' 2"  (1.575 m)   Wt 53.1 kg (117 lb)   SpO2 99%   BMI 21.40 kg/m   Vital signs normal    Physical Exam  Constitutional: She is oriented to person, place, and time. She appears well-developed and well-nourished.  Non-toxic appearance. She does not appear ill. No distress.  Pleasant elderly female who is ambulatory in her room  HENT:  Head: Normocephalic and atraumatic.  Right Ear: External ear normal.  Left Ear: External ear  normal.  Nose: Nose normal. No mucosal edema or rhinorrhea.  Mouth/Throat: Oropharynx is clear and moist and mucous membranes are normal. No dental abscesses or uvula swelling.  Eyes: Pupils are equal, round, and reactive to light. Conjunctivae and EOM are normal.  Neck: Normal range of motion and full passive range of motion without pain. Neck supple.  Cardiovascular: Normal rate, regular rhythm and normal heart sounds.  Exam reveals no gallop and no friction rub.   No murmur heard. Pulmonary/Chest: Effort normal and breath sounds normal. No respiratory distress. She has no wheezes. She has no rhonchi. She has no rales. She exhibits no tenderness and no crepitus.  Abdominal: Soft. Normal appearance and bowel  sounds are normal. She exhibits no distension. There is no tenderness. There is no rebound and no guarding.    Patient is tender in the left lower quadrant and in her left flank to palpation. She does not appear to be painful over the left hip joint.  Musculoskeletal: Normal range of motion. She exhibits no edema or tenderness.  Moves all extremities well.   Neurological: She is alert and oriented to person, place, and time. She has normal strength. No cranial nerve deficit.  Skin: Skin is warm, dry and intact. No rash noted. No erythema. No pallor.  Psychiatric: She has a normal mood and affect. Her speech is normal and behavior is normal. Her mood appears not anxious.  Nursing note and vitals reviewed.    ED Treatments / Results  Labs (all labs ordered are listed, but only abnormal results are displayed) Results for orders placed or performed during the hospital encounter of 06/23/17  Urinalysis, Routine w reflex microscopic- may I&O cath if menses  Result Value Ref Range   Color, Urine YELLOW YELLOW   APPearance CLEAR CLEAR   Specific Gravity, Urine 1.014 1.005 - 1.030   pH 6.0 5.0 - 8.0   Glucose, UA NEGATIVE NEGATIVE mg/dL   Hgb urine dipstick SMALL (A) NEGATIVE   Bilirubin  Urine NEGATIVE NEGATIVE   Ketones, ur NEGATIVE NEGATIVE mg/dL   Protein, ur NEGATIVE NEGATIVE mg/dL   Nitrite NEGATIVE NEGATIVE   Leukocytes, UA SMALL (A) NEGATIVE   RBC / HPF 0-5 0 - 5 RBC/hpf   WBC, UA 0-5 0 - 5 WBC/hpf   Bacteria, UA NONE SEEN NONE SEEN   Squamous Epithelial / LPF 0-5 (A) NONE SEEN  Comprehensive metabolic panel  Result Value Ref Range   Sodium 144 135 - 145 mmol/L   Potassium 3.8 3.5 - 5.1 mmol/L   Chloride 108 101 - 111 mmol/L   CO2 27 22 - 32 mmol/L   Glucose, Bld 97 65 - 99 mg/dL   BUN 20 6 - 20 mg/dL   Creatinine, Ser 0.84 0.44 - 1.00 mg/dL   Calcium 9.3 8.9 - 10.3 mg/dL   Total Protein 6.6 6.5 - 8.1 g/dL   Albumin 3.9 3.5 - 5.0 g/dL   AST 27 15 - 41 U/L   ALT 21 14 - 54 U/L   Alkaline Phosphatase 32 (L) 38 - 126 U/L   Total Bilirubin 0.8 0.3 - 1.2 mg/dL   GFR calc non Af Amer 56 (L) >60 mL/min   GFR calc Af Amer >60 >60 mL/min   Anion gap 9 5 - 15  CBC with Differential  Result Value Ref Range   WBC 4.8 4.0 - 10.5 K/uL   RBC 4.38 3.87 - 5.11 MIL/uL   Hemoglobin 13.4 12.0 - 15.0 g/dL   HCT 40.5 36.0 - 46.0 %   MCV 92.5 78.0 - 100.0 fL   MCH 30.6 26.0 - 34.0 pg   MCHC 33.1 30.0 - 36.0 g/dL   RDW 13.9 11.5 - 15.5 %   Platelets 182 150 - 400 K/uL   Neutrophils Relative % 38 %   Neutro Abs 1.8 1.7 - 7.7 K/uL   Lymphocytes Relative 49 %   Lymphs Abs 2.4 0.7 - 4.0 K/uL   Monocytes Relative 11 %   Monocytes Absolute 0.5 0.1 - 1.0 K/uL   Eosinophils Relative 2 %   Eosinophils Absolute 0.1 0.0 - 0.7 K/uL   Basophils Relative 0 %   Basophils Absolute 0.0 0.0 - 0.1  K/uL   Laboratory interpretation all normal except ? UTI, urine culture sent      EKG  EKG Interpretation None       Radiology Ct Renal Stone Study  Result Date: 06/23/2017 CLINICAL DATA:  Flank pain, left groin pain. EXAM: CT ABDOMEN AND PELVIS WITHOUT CONTRAST TECHNIQUE: Multidetector CT imaging of the abdomen and pelvis was performed following the standard protocol without  IV contrast. COMPARISON:  Prior CT from 11/12/2016. FINDINGS: Lower chest: Scattered atelectatic changes present within the visualized lung bases. Partially visualized lung bases are otherwise clear. No pleural pericardial effusion. Hepatobiliary: Punctate calcification noted within the right hepatic lobe. Limited noncontrast evaluation of the liver is otherwise unremarkable. Gallbladder within normal limits. No biliary dilatation. Pancreas: Pancreas grossly normal without acute inflammatory changes. Spleen: Spleen within normal limits. Adrenals/Urinary Tract: Adrenal glands within normal limits. 2.2 cm cyst present at the upper pole the right kidney. Kidneys equal in size without evidence for hydronephrosis. There is a single punctate 2 mm nonobstructive calculus within the lower pole left kidney. No definite radiopaque calculi seen along the expected course of either renal collecting system. No hydronephrosis or hydroureter. Bladder largely decompressed. No layering stones seen within the bladder lumen. Stomach/Bowel: Stomach within normal limits. No evidence for bowel obstruction. No significant acute inflammatory changes seen about the bowels on this noncontrast examination. Vascular/Lymphatic: Intra-abdominal aorta is tortuous with moderate aorto bi-iliac atherosclerotic disease. No aneurysm. No appreciable adenopathy. Reproductive: Pelvic viscera grossly within normal limits, although not well evaluated on this noncontrast examination. Other: No free intraperitoneal air.  No free fluid. Musculoskeletal: No acute osseus abnormality. No worrisome lytic or blastic osseous lesions. Levoscoliosis multilevel degenerative spondylolysis noted within the visualized spine. IMPRESSION: 1. Punctate 2 mm nonobstructive left renal nephrolithiasis. No other radiopaque calculi identified. No evidence for obstructive uropathy. 2. No other acute intra-abdominal or pelvic process identified. 3. Levoscoliosis with multilevel  degenerative spondylolysis within the visualized spine. Electronically Signed   By: Jeannine Boga M.D.   On: 06/23/2017 06:13    Dg Hip Unilat With Pelvis 2-3 Views Left  Result Date: 05/25/2017 CLINICAL DATA:  Left hip pain for 7 months IMPRESSION: Osteoarthritic changes of left hip. No acute fracture or dislocation. Electronically Signed   By: Abelardo Diesel M.D.   On: 05/25/2017 14:20    CLINICAL DATA:  Left-sided abdominal pain EXAM: ABDOMEN - 2 VIEW  COMPARISON:  11/12/2016  IMPRESSION: No acute abnormality noted.   Electronically Signed   By: Inez Catalina M.D.   On: 05/04/2017 11:24  Procedures Procedures (including critical care time)  Medications Ordered in ED Medications  cephALEXin (KEFLEX) capsule 500 mg (not administered)     Initial Impression / Assessment and Plan / ED Course  I have reviewed the triage vital signs and the nursing notes.  Pertinent labs & imaging results that were available during my care of the patient were reviewed by me and considered in my medical decision making (see chart for details).    Recheck at 7 AM patient's lying in the stretcher in no distress. We went over her medications. Her doctor recently started her on baclofen and meloxicam for her pain. She has an appointment to see him in 2 days.  Patient's laboratory results have resulted and are normal. Patient was started on oral Keflex for possible UTI. She states she did start having dysuria the last couple days. At this point her pain appears to be from her hip.   Final Clinical Impressions(s) / ED  Diagnoses   Final diagnoses:  LLQ pain  Acute cystitis without hematuria  Left flank pain    New Prescriptions New Prescriptions   CEPHALEXIN (KEFLEX) 500 MG CAPSULE    Take 1 capsule (500 mg total) by mouth 3 (three) times daily.   Plan discharge  Rolland Porter, MD, Barbette Or, MD 06/23/17 [phone]

## 2017-06-24 ENCOUNTER — Ambulatory Visit (INDEPENDENT_AMBULATORY_CARE_PROVIDER_SITE_OTHER): Payer: Medicare Other | Admitting: Orthopedic Surgery

## 2017-06-24 ENCOUNTER — Encounter (INDEPENDENT_AMBULATORY_CARE_PROVIDER_SITE_OTHER): Payer: Self-pay | Admitting: Orthopedic Surgery

## 2017-06-24 DIAGNOSIS — M1711 Unilateral primary osteoarthritis, right knee: Secondary | ICD-10-CM

## 2017-06-24 LAB — URINE CULTURE

## 2017-06-24 NOTE — Progress Notes (Signed)
Office Visit Note   Patient: Heather [name]           Date of Birth: 02/26/1918           MRN: [id number] Visit Date: 06/24/2017              Requested by: [place] Bing, DO [address] [place], [place] [address] PCP:  Bing, DO   Assessment & Plan: Visit Diagnoses:  1. Unilateral primary osteoarthritis, right knee     Plan:  #1: Prescription was given for knee stabilizing [facility] the [facility] #2: If in the future she would desire to have a cortisone injection she may make another appointment but at this time she does not want one  Follow-Up Instructions: No Follow-up on file.   Orders:  No orders of the defined types were placed in this encounter.  No orders of the defined types were placed in this encounter.     Procedures: No procedures performed   Clinical Data: No additional findings.   Subjective: Chief Complaint  Patient presents with  . Right Knee - Pain  . Knee Pain    Medial right knee pain with swelling, Does not want injection today, wants to discuss if brace would help. Weakness. Uses walker.    Heather [name] is a pleasant 83 year old African-American female who is seen today complaining of right knee pain more in the medial aspect with some swelling. We have treated her in the past for arthritis in the right knee. At her last visit in May 2017 she had evidence of tricompartment degenerative changes on x-ray she had had a cortisone injection 3 months before that as well as one on 03/24/2016. That was her last visit for her right knee. She is obviously done well so far. Comes in today requesting a brace for her knee. She does use a walker. He did not want an injection today.          Review of Systems  Cardiovascular:       History of hypertension  Gastrointestinal: Positive for constipation.  Psychiatric/Behavioral:       Anxiety  All other systems reviewed and are negative.    Objective: Vital Signs: There  were no vitals taken for this visit.  Physical Exam  Constitutional: She is oriented to person, place, and time. She appears well-developed and well-nourished.  HENT:  Head: Normocephalic and atraumatic.  Eyes: Pupils are equal, round, and reactive to light. EOM are normal.  Pulmonary/Chest: Effort normal.  Neurological: She is alert and oriented to person, place, and time.  Skin: Skin is warm and dry.  Psychiatric: She has a normal mood and affect. Her behavior is normal. Judgment and thought content normal.    Ortho Exam  Exam today of the right knee reveals maybe a little bit of effusion . No warmth or erythema. Range of motion from a few degrees shy of full extension to 95. Crepitance range with range of motion. Ligamentously stable. Calf is supple and nontender.  Specialty Comments:  No specialty comments available.  Imaging: No results found.   PMFS History: Patient Active Problem List   Diagnosis Date Noted  . Vitamin D deficiency 05/21/2017  . Abnormal CT scan of lung 07/16/2015  . Esophageal dysmotility 10/27/2014  . Other dysphagia 05/09/2014  . Failure to thrive in adult 04/26/2014  . Arthritis of hand, degenerative 04/10/2014  . Other and unspecified hyperlipidemia 04/10/2014  . LPRD (laryngopharyngeal reflux disease) 04/04/2014  .  Allergic rhinitis 02/21/2014  . Abnormal findings in stool 12/02/2013  . Abdominal pain, left lower quadrant 06/22/2013  . Chronic bronchitis ([place]) 05/30/2013  . Somatization disorder 03/14/2013  . Hx of breast cancer 08/30/2012  . Edema 06/12/2011  . GERD 03/27/2010  . Diastolic CHF ([place]) 81/15/7262  . Constipation 01/18/2009  . Carpal tunnel syndrome on both sides 10/03/2008  . Cervical spondylosis without myelopathy 03/21/2008  . Diaphragmatic hernia 12/26/2007  . Renal cyst 12/26/2007  . Anxiety state 09/15/2007  . Primary hypertension 09/15/2007  . Venous (peripheral) insufficiency 09/15/2007  . Osteoarthritis 09/15/2007   . Lumbago 09/15/2007  . Diverticulosis of colon 05/24/2002   Past Medical History:  Diagnosis Date  . Allergic rhinitis, cause unspecified 02/21/2014  . Anxiety state, unspecified   . Breast cancer ([place])    NO STICK OR BLOOD PRESSURE CHECKS IN LEFT ARM  . Carpal tunnel syndrome   . Cervical spondylosis   . Colon polyp 12/19/2009   Transverse-polypoid colorectal mucosa  . Constipation   . Degenerative joint disease   . Diastolic dysfunction   . Diverticula of colon   . Gastritis, chronic   . Hiatal hernia   . Hypertension   . Low back pain   . RBBB (right bundle branch block with left anterior fascicular block)   . Renal cyst   . Venous insufficiency     Family History  Problem Relation Age of Onset  . Colon cancer Neg Hx     Past Surgical History:  Procedure Laterality Date  . CARPAL TUNNEL RELEASE    . Left mastectomy     Social History   Occupational History  . Retired Retired   Social History Main Topics  . Smoking status: Never Smoker  . Smokeless tobacco: Never Used  . Alcohol use No  . Drug use: No  . Sexual activity: Not on file

## 2017-06-25 ENCOUNTER — Ambulatory Visit (INDEPENDENT_AMBULATORY_CARE_PROVIDER_SITE_OTHER): Payer: Medicare Other | Admitting: Family Medicine

## 2017-06-25 ENCOUNTER — Encounter: Payer: Self-pay | Admitting: Licensed Clinical Social Worker

## 2017-06-25 VITALS — BP 112/58 | HR 78 | Temp 98.2°F | Ht 62.0 in | Wt 117.6 lb

## 2017-06-25 DIAGNOSIS — Z79899 Other long term (current) drug therapy: Secondary | ICD-10-CM | POA: Diagnosis not present

## 2017-06-25 DIAGNOSIS — F0390 Unspecified dementia without behavioral disturbance: Secondary | ICD-10-CM | POA: Diagnosis not present

## 2017-06-25 DIAGNOSIS — F03918 Unspecified dementia, unspecified severity, with other behavioral disturbance: Secondary | ICD-10-CM | POA: Insufficient documentation

## 2017-06-25 NOTE — Progress Notes (Signed)
Total time:15 minutes Type of Service: [place] warm handoff  Interpretor:No.    SUBJECTIVE: Heather [name] is a 83 y.o. female  Patient was referred by Dr. McDiarmid for: [place]. patient was accompanied by her son Heather [name].   Patient is alert and engaged in conversation. She reports the following concerns: would like someone to assist her with her bath.      LIFE CONTEXT:  Family & Social: Patient lives alone, uses a walker to ambulate with minimal assistance;  her son lives near by and is her primary support system.     GOALS: Patient would like to remain in her home as long as she can and would like to seek out supportive community services.  Intervention:  Teacher, adult education, Issues discussed: PACE, Aeronautical engineer (Health Dept), Information to apply for Medicaid (PCS) Services, [facility] and Caregiver support groups.  Also discussed private pay for personal care, this was not an option for patient.    ASSESSMENT:Patient reports she is able to care for herself but getting to the point of needing assistance.  Son is supportive but does not live with patient.   Patient may benefit from, and is in agreement to receive further assessment from [facility] care management if she is eligible for assistance with managing her chronic conditions and medications.   PLAN: 1. Dr. McDiarmid will place referral for [facility] 2. LCSW will F/U with patient on resources provided in 7 to 10 business days 3. Patient or son will call LCSW if they have questions.  Warm Hand Off Completed.     Casimer Lanius, LCSW Licensed Clinical Social Worker [place]   [phone] 4:19 PM

## 2017-06-25 NOTE — Assessment & Plan Note (Addendum)
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 Mild-mod dementia. Patient is fairly independent with her ADLs. She would like some assistance with bathing. Discussed with Heather [name] who will provide list of resources.   Dr McDiarmid spoke with patient's son alone who feels his mother is doing fairly well.  His goal is to keep her at her home, to the point that he would be willing to move into her home if necessary to care for her. He does wish she would accept the doctor's explanations for her hip and abdominal pains and stop seeking further medical evaluations for them,  but he is not sure she ever will.   While Heather [name]'s MoCA score is in the more severe range of impairment, she is functioning in range of mild Dementia (FAST Stage 4 - some problems with more complex tasks). No behavioral issues identified.

## 2017-06-25 NOTE — Progress Notes (Signed)
[facility]:   Patient is accompanied by: son, [name], within 5 minutes walking.  Primary caregiver: son Patient lives alone. Patient information was obtained from patient and son. History/Exam limitations: none. Primary Care Provider: Haiku-Pauwela Bing, DO Referring provider: [place] Reason for referral:  Chief Complaint  Patient presents with  . Memory Loss  . Abdominal Pain    History Chief Complaint  Patient presents with  . Memory Loss  . Abdominal Pain    HPI by problems:   Cognitive impairment concern What problems with thinking are there? Patient sometimes forgets something is cooking or she would fall asleep when there is something on the stove.   When were the changes first noticed? About 1.5 years ago  Did this change occur abruptly or gradually?  gradual  How have the changes progressed since then?  stable  Does their level of alertness change throughout the day?  no  Is their speech disorganized, rambling?  no  Has there been any tremors or abnormal movements?  no  Have they had in hallucinations or delusions:  no  Have they appeared more anxious or sad lately?  no  Do they still have interests or activities they enjor doing?  yes, can't go as much as she used to when she was younger  How has their appetite been lately?  show no change.  Cooks her own meals.   How has their sleep been lately?  generally restful sleep  Problem behaviors: none   Compared to 5 to 10 years ago, how is the patient at:  Problems with Judgment, e.g., problem making decisions, bad financial decisions, problems with thinking? show no change   Less interested in hobbies or previously enjoyed activities? show no change   Trouble remembering appointments? show no change    Remembering things about family and friends e.g. names,  occupations, birthdays, addresses?  show no change  Remembering things that have happened recently?   show no change  Recalling conversations a few days later?  show no change  Remembering what day and month it is? Slight worsening  Remembering where things are usually kept?  show no change  Losing things?  Slightly worsening  Learning to use a new gadget or machine around the house,e.g., computer, microwave, remote control?  Not sure   Learning new things in general?  No change  Following a story in a book or on TV?  No change  Handling money for shopping?  No change   Handling financial matters, e.g. their pension,  dealing with the bank?  Slightly worsening  Able to cope with unexpected events?  show no change  Getting lost?  show no change  Asking same questions repeatedly or telling  the same story repeatedly to the same person(s)?  yes     Outpatient Encounter Prescriptions as of 06/25/2017  Medication Sig  . ALPRAZolam (XANAX) 0.25 MG tablet Take 1 tablet (0.25 mg total) by mouth at bedtime as needed for anxiety or sleep.  . baclofen (LIORESAL) 10 MG tablet Take 1 tablet (10 mg total) by mouth 3 (three) times daily.  . carvedilol (COREG) 3.125 MG tablet Take 1 tablet (3.125 mg total) by mouth 2 (two) times daily.  . cephALEXin (KEFLEX) 500 MG capsule Take 1 capsule (500 mg total) by mouth 3 (three) times daily.  . Cholecalciferol (VITAMIN D3) 1000 units CAPS Take 1 capsule (1,000 Units total) by mouth daily. For your bones  . feeding supplement, ENSURE ENLIVE, (ENSURE  ENLIVE) LIQD Take 237 mLs by mouth 2 (two) times daily between meals.  . furosemide (LASIX) 20 MG tablet Take 1 tablet (20 mg total) by mouth daily.  . hydroxypropyl methylcellulose / hypromellose (ISOPTO TEARS / GONIOVISC) 2.5 % ophthalmic solution Place 1 drop into both eyes 3 (three) times daily as needed for dry eyes.  Marland Kitchen levocetirizine (XYZAL) 5 MG tablet Take 1 tablet (5 mg total) by mouth every evening.  . meloxicam (MOBIC) 7.5 MG tablet Take 1 tablet (7.5 mg total) by mouth daily.  .  Multiple Vitamins-Minerals (MULTIVITAMINS THER. W/MINERALS) TABS Take 1 tablet by mouth daily.  . pantoprazole (PROTONIX) 40 MG tablet Take 1 tablet (40 mg total) by mouth 2 (two) times daily.  . polyethylene glycol powder (GLYCOLAX/MIRALAX) powder Take 17 g by mouth daily as needed. (Patient taking differently: Take 17 g by mouth daily as needed for moderate constipation. )  . spironolactone (ALDACTONE) 25 MG tablet Take 1 tablet (25 mg total) by mouth daily.   No facility-administered encounter medications on file as of 06/25/2017.     History Patient Active Problem List   Diagnosis Date Noted  . Dementia 06/25/2017  . Vitamin D deficiency 05/21/2017  . Abnormal CT scan of lung 07/16/2015  . Esophageal dysmotility 10/27/2014  . Other dysphagia 05/09/2014  . Failure to thrive in adult 04/26/2014  . Arthritis of hand, degenerative 04/10/2014  . Other and unspecified hyperlipidemia 04/10/2014  . LPRD (laryngopharyngeal reflux disease) 04/04/2014  . Allergic rhinitis 02/21/2014  . Abnormal findings in stool 12/02/2013  . Abdominal pain, left lower quadrant 06/22/2013  . Chronic bronchitis ([place]) 05/30/2013  . Somatization disorder 03/14/2013  . Hx of breast cancer 08/30/2012  . Edema 06/12/2011  . GERD 03/27/2010  . Diastolic CHF ([place]) 80/99/8338  . Constipation 01/18/2009  . Carpal tunnel syndrome on both sides 10/03/2008  . Cervical spondylosis without myelopathy 03/21/2008  . Diaphragmatic hernia 12/26/2007  . Renal cyst 12/26/2007  . Anxiety state 09/15/2007  . Primary hypertension 09/15/2007  . Venous (peripheral) insufficiency 09/15/2007  . Osteoarthritis 09/15/2007  . Lumbago 09/15/2007  . Diverticulosis of colon 05/24/2002   Past Medical History:  Diagnosis Date  . Allergic rhinitis, cause unspecified 02/21/2014  . Anxiety state, unspecified   . Breast cancer ([place])    NO STICK OR BLOOD PRESSURE CHECKS IN LEFT ARM  . Carpal tunnel syndrome   . Cervical spondylosis   .  Colon polyp 12/19/2009   Transverse-polypoid colorectal mucosa  . Constipation   . Degenerative joint disease   . Diastolic dysfunction   . Diverticula of colon   . Gastritis, chronic   . Hiatal hernia   . Hypertension   . Low back pain   . RBBB (right bundle branch block with left anterior fascicular block)   . Renal cyst   . Venous insufficiency    Past Surgical History:  Procedure Laterality Date  . CARPAL TUNNEL RELEASE    . Left mastectomy     Family History  Problem Relation Age of Onset  . Colon cancer Neg Hx    Social History   Social History  . Marital status: Widowed    Spouse name: N/A  . Number of children: 1  . Years of education: N/A   Occupational History  . Retired Retired   Social History Main Topics  . Smoking status: Never Smoker  . Smokeless tobacco: Never Used  . Alcohol use No  . Drug use: No  . Sexual activity: Not  Asked   Other Topics Concern  . None   Social History Narrative  . None    Cardiovascular Risk Factors: age  Personal History of Seizures: No  Personal History of Stroke: No  Personal History of Head Trauma: No  Personal History of Psychiatric Disorders: No  Family History of Dementia: No  Educational History: 11 years formal education  Basic Activities of Daily Living  AADLs IIndependent NNeeds Assistance DDependent  Bbathing x x x   Ddressing  x    AAmbulation  x    TToileting x x    EEating xx          Instrumental Activities of Daily Living IADL Independent Needs Assistance Dependent  Cooking x    Housework x    Manage Medications x    Manage the telephone x    Shopping for food, clothes, Meds, etc x    Use transportation  x   Manage Finances  x     Caregivers in home: none, has son who lives 5 mins aways    FALLS in last five office visits:  Fall Risk  06/25/2017 06/10/2017 05/21/2017 04/24/2017 03/13/2017  Falls in the past year? No No No No No  Risk for fall due to : - - - Impaired balance/gait -     Health Maintenance reviewed: Immunization History  Administered Date(s) Administered  . Influenza,inj,Quad PF,36+ Mos 09/18/2015   The patient has no Health Maintenance topics of status Overdue, Due On, or Due Soon  Diet: Regular Nutritional supplements: none  Geriatric Syndromes: Constipation no ,   Incontinence yes  Dizziness no   Syncope no   Skin problems no   Visual Impairment yes   Hearing impairment yes  Eating impairment no  Impaired Memory or Cognition yes  Behavioral problems no   Sleep problems no   Weight loss no    ROS Denies fevers/chills; denies changes in appetite; denies changes in weight;  Denies changes in vision / hearing / smell / taste; Denies runny nose / ear pain or discharge / sore throat / sinus congestion / cough/w phlegm; Denies chest congestion / wheezing;  Denies chest pain; denies heart beating slower/thumps inside chest; denies racing heart/flutter; Denies dysuria; denies hematuria;  Denies constipation; denies melena/hematochezia; denies diarrhea;  Denies abdominal discomfort/gaseous bloating; denies N/V; denies heart burn;  Denies recent falls/unsteady gait;  Denies unilateral weakness / clumsiness / tingling / numbness; denies tremors;  Denies sadness / anxiety / suicidal tendencies  Vital Signs Weight: 117 lb 9.6 oz (53.3 kg) Body mass index is 21.51 kg/m. Estimated Creatinine Clearance: 30.3 mL/min (by C-G formula based on SCr of 0.84 mg/dL). Body surface area is 1.53 meters squared. Vitals:   06/25/17 1431  BP: (!) 112/58  Pulse: 78  Temp: 98.2 F (36.8 C)  TempSrc: Oral  SpO2: 98%  Weight: 117 lb 9.6 oz (53.3 kg)  Height: 5\' 2"  (1.575 m)   Wt Readings from Last 3 Encounters:  06/25/17 117 lb 9.6 oz (53.3 kg)  06/23/17 117 lb (53.1 kg)  06/10/17 117 lb 6.4 oz (53.3 kg)    Hearing Screening   Method: Audiometry   125Hz  250Hz  500Hz  1000Hz  2000Hz  3000Hz  4000Hz  6000Hz  8000Hz   Right ear:   Fail Fail Fail  Fail    Left  ear:   Fail Fail Fail  Fail    Vision Screening Comments: Sees Dr. Katy Fitch   Physical Examination:  VS reviewed GEN: Alert, Cooperative, Groomed, NAD HEENT: PERRL Neuro: Oriented to  person, place, and time; Strength: 5/5 Bil. UE and LE symmetric; Muscle Tone normal; Tremor not present Timed Up & Go Test: less than 12 seconds Gait: No significant path deviation, Step-through present  Psych: Normal affect/thought/speech/language    Montreal Cognitive Assessment:  Patient did  require additional cues or prompts to complete tasks. Patient was cooperative and attentive to testing tasks Patient did  appear motivated to perform well  MMSE - Mini Mental State Exam 09/30/2016  Orientation to time 5  Orientation to Place 5  Registration 3  Attention/ Calculation 0  Recall 0  Language- name 2 objects 2  Language- repeat 1  Language- follow 3 step command 3  Language- read & follow direction 1  Write a sentence 1  Copy design 1  Total score 22       MoCA blind:   No flowsheet data found.  Geriatric Depression Scale:  1 / 15  Labs  No results found for: VITAMINB12  No results found for: FOLATE  Lab Results  Component Value Date   TSH 2.67 11/30/2014    No results found for: RPR    Chemistry      Component Value Date/Time   NA 144 06/23/2017 0630   K 3.8 06/23/2017 0630   CL 108 06/23/2017 0630   CO2 27 06/23/2017 0630   BUN 20 06/23/2017 0630   CREATININE 0.84 06/23/2017 0630      Component Value Date/Time   CALCIUM 9.3 06/23/2017 0630   ALKPHOS 32 (L) 06/23/2017 0630   AST 27 06/23/2017 0630   ALT 21 06/23/2017 0630   BILITOT 0.8 06/23/2017 0630       No results found for: HGBA1C    Lab Results  Component Value Date   WBC 4.8 06/23/2017   HGB 13.4 06/23/2017   HCT 40.5 06/23/2017   MCV 92.5 06/23/2017   PLT 182 06/23/2017    No results found for this or any previous visit (from the past 24 hour(s)).   Assessment and Plan: Problem List Items  Addressed This Visit      Nervous and Auditory   Dementia    MoCA- Blind: 10/22 or 14/33. Mild-mod dementia. Patient is fairly independent with her ADLs. She would like some assistance with bathing. Discussed with Ms. Laurance Flatten who will provide list of resources.        Other Visit Diagnoses    High risk medication use    -  Primary   Relevant Orders   Basic Metabolic Panel      Personal Strengths Ability for insight Active sense of humor Average or above average intelligence Capable of independent living Communication skills Motivation for treatment/growth Physical Health Religious Affiliation Supportive family/friends  Support System Strengths Supportive Relationships and Church  Dementia MoCA- Blind: 10/22 or 14/33. Mild-mod dementia. Patient is fairly independent with her ADLs. She would like some assistance with bathing. Discussed with Ms. Laurance Flatten who will provide list of resources.   60 minutes face to face where spent in total with counseling / coordination of care took more than 50% of the total time. Counseling involved discussion differential diagnosis, testing, prognosis, adherence, risk reduction, benefits of treatment, instructions, compliance.  Care was coordinated with our Clinic Social Worker, Ms. Moore  who also saw patient during his visit.

## 2017-06-25 NOTE — Patient Instructions (Signed)
Alzheimer Disease Caregiver Guide A person who has Alzheimer disease may not be able to take care of himself or herself. He or she may need help with simple tasks. The tips below can help you care for the person. Memory loss and confusion If the person is confused or cannot remember things:  Stay calm.  Respond with a short answer.  Avoid correcting him or her in a way that sounds like scolding.  Try not to take it personally, even if he or she forgets your name.  Behavior changes The person may go through behavior changes. This can include depression, anxiety, anger, or seeing things that are not there. When behavior changes:  Try not to take behavior changes personally.  Stay calm and patient.  Do not argue or try to convince the person about a specific point.  Know that these changes are part of the disease process. Try to work through it.  Tips to lessen frustration  Make appointments and do daily tasks when the person is at his or her best.  Take your time. Simple tasks may take longer. Allow plenty of time to complete tasks.  Limit choices for the person.  Involve the person in what you are doing.  Stick to a routine.  Avoid new or crowded places, if possible.  Use simple words, short sentences, and a calm voice. Only give 1 direction at a time.  Buy clothes and shoes that are easy to put on and take off.  Let people help if they offer. Home safety  Keep floors clear. Remove rugs, magazine racks, and floor lamps.  Keep hallways well lit.  Put a handrail and nonslip mat in the bathtub or shower.  Put childproof locks on cabinets that have dangerous items in them. These items include medicine, alcohol, guns, toxic cleaning items, sharp tools, matches, or lighters.  Place locks on doors where the person cannot see or reach them. This helps the person to not wander out of the house and get lost.  Be prepared for emergencies. Keep a list of emergency  phone numbers and addresses in a handy area. Plans for the future  Talk about finances. ? Talk about money management. People with Alzheimer disease have trouble managing their money as the disease gets worse. ? Get help from professional advisors about financial and legal matters.  Talk about future care. ? Choose a power of attorney. This is someone who can make decisions for the person with Alzheimer disease when he or she can no longer do so. ? Talk about driving and when it is the right time to stop. The person's doctor can help with this. ? If the person lives alone, make sure he or she is safe. Some people need extra help at home. Other people need more care at a nursing home or care center. Support groups Some benefits of joining a support group include:  Learning ways to manage stress.  Sharing experiences with others.  Getting emotional comfort and support.  Learning new caregiving skills as the disease progresses.  Knowing what community resources are available and taking advantage of them.  Get help if:  The person has a fever.  The person has a sudden behavior change that does not get better with calming strategies.  The person is unable to manage his or her living situation.  The person threatens you or anyone else, including himself or herself.  You are no longer able to care for the person. This information  is not intended to replace advice given to you by your health care provider. Make sure you discuss any questions you have with your health care provider. Document Released: 01/26/2012 Document Revised: 04/10/2016 Document Reviewed: 12/24/2011 Elsevier Interactive Patient Education  2017 [place]  Address :[place]. [place] : [place] Zip : [place] Website : [email or web address] Contact Email : [email or web address] Office Phone : [phone] Information Phone : [phone] Special Notes : [facility]- [phone] [place]- [phone]  Senior Resources of [place] can provide information on local resources including:   Information and Referral (Senior InfoLine) providing seniors, family members, caregivers and others access to information and assistance for the elderly.  Home Delivered Meals (Meals on Wheels)  [place] for those ages 79 and over are offered daytime supervised programs targeted to meet their social, educational, physical and recreational interests. A lunch is also served to those who meet the eligibility criteria.  Caregivers - Volunteer-based program designed to provide seniors with 1-3 hours of service per week. Services include friendly visiting, assistance with grocery shopping, errands, general transportation, etc.  Medical Transportation: Transportation to medical appointments for seniors ages 35 and over who do not receive Medicaid. Funds are occasionally available to transport disabled individuals under 60 who do not receive Medicaid.  [place] (S.H.I.I.P.): Volunteers are trained by the [place] S.H.I.I.P. office, to provide free information, counseling and education to [employer] beneficiaries and their family members about: Medicare, Medicare Supplements, [place], and [place] Prescription Drug Plans.  Nutritional Supplement Program: Ensure, Ensure Plus, and Glucerna products are sold at reduced prices. Simply fill out an application.    Alzheimer's Association [place] is one of over 33 Alzheimer's Association chapters serving communities across the [place]. 24/7 Helpline: 1.714-214-2981 Website at CapitalM[employer].co.nz             Local chapters across the [place], providing services within each community.  Professionally staffed 24/7 Helpline (1.714-214-2981)  offers information and advice to more than 300,000 callers each year and provides translation services in more than 200 languages.   Host face-to-face support groups and educational sessions in communities nationwide.   Connect people across the globe through our [employer], ALZConnected. Our online community is ready to answer your questions and give you support.   Provide caregivers and families with comprehensive online resources and information through our Alzheimer's and [place], which features sections on early-stage, middle-stage and late-stage caregiving.   Help people find clinical studies through our free, easy-to-use matching service Alzheimer's Association TrialMatch. TrialMatch connects individuals with Alzheimer's, caregivers, healthy volunteers and physicians with current studies.   Offer a free online tool, Alzheimer's Navigator, helps those facing the disease to determine their needs and develop an action plan, and our online [employer] is a Glass blower/designer of programs and service, housing and care services, and Clinical cytogeneticist.   House the Alzheimer's Association DIRECTV, the nation's DIRECTV and resource center devoted to increasing knowledge about Alzheimer's disease and related dementias.  Provide safety services, Comfort Zone and MedicAlert + Alzheimer's Association Safe Return, provide location management for people with Alzheimer's who wander.   Our annual Walk to End Alzheimer's is the world's largest event to raise awareness and funds for Alzheimer's care, support and research.

## 2017-06-26 LAB — BASIC METABOLIC PANEL
BUN/Creatinine Ratio: 23 (ref 12–28)
BUN: 22 mg/dL (ref 10–36)
CALCIUM: 9.5 mg/dL (ref 8.7–10.3)
CHLORIDE: 104 mmol/L (ref 96–106)
CO2: 25 mmol/L (ref 20–29)
Creatinine, Ser: 0.97 mg/dL (ref 0.57–1.00)
GFR calc non Af Amer: 49 mL/min/{1.73_m2} — ABNORMAL LOW (ref >59)
GFR, EST AFRICAN AMERICAN: 57 mL/min/{1.73_m2} — AB (ref >59)
Glucose: 82 mg/dL (ref 65–99)
Potassium: 4.3 mmol/L (ref 3.5–5.2)
Sodium: 144 mmol/L (ref 134–144)

## 2017-06-26 NOTE — Progress Notes (Signed)
I have interviewed and examined the patient with Dr Dallas Schimke.  II agree with their assessments and plans as documented in their consultt note.   She was able to rise unassisted from her chair using its arms. Her gait with rolling walker had full strides with coordinated heel-toe between legs. No shuffle. No loss of balance with turns.   45  minutes face to face where spent in total with counseling / coordination of care took more than 50% of the total time. Counseling involved discussion diagnosis, prognosis, future disposition  Care was coordinated with our MSW, LCSW Casimer Lanius who also saw patient and her son during his visit.  I discussed his case with Ms Laurance Flatten.

## 2017-06-29 DIAGNOSIS — M1711 Unilateral primary osteoarthritis, right knee: Secondary | ICD-10-CM | POA: Diagnosis not present

## 2017-06-30 NOTE — Addendum Note (Signed)
Addended by: Lissa Morales D on: 06/30/2017 04:41 PM   Modules accepted: Orders

## 2017-07-02 ENCOUNTER — Telehealth: Payer: Self-pay | Admitting: *Deleted

## 2017-07-02 NOTE — Telephone Encounter (Signed)
Patient called reporting her abdominal pain is getting worse and the Mobic is not working.  The pain is located on left side, which is radiates to her back at times. Patient stated that the pain has been going on for a while.  Advised patient that she needed to be seen in clinic. Appointment scheduled for same day tomorrow with Dr. Andy Gauss at 11:25 AM.  Derl Barrow, RN

## 2017-07-03 ENCOUNTER — Encounter: Payer: Self-pay | Admitting: Family Medicine

## 2017-07-03 ENCOUNTER — Ambulatory Visit (INDEPENDENT_AMBULATORY_CARE_PROVIDER_SITE_OTHER): Payer: Medicare Other | Admitting: Family Medicine

## 2017-07-03 ENCOUNTER — Ambulatory Visit: Payer: Medicare Other | Admitting: Family Medicine

## 2017-07-03 VITALS — BP 118/60 | HR 72 | Temp 99.2°F | Ht 62.0 in | Wt 121.6 lb

## 2017-07-03 DIAGNOSIS — R1032 Left lower quadrant pain: Secondary | ICD-10-CM

## 2017-07-03 NOTE — Patient Instructions (Signed)
It was great seeing you today! We have addressed the following issues today  1. I will stop your mobic and baclofen given very little change in your pain. We do not want you to taks medications that are not helping you. Abdominal pain likely caused by pain from your hip that can refer to to lower abdomen. 2. I scheduled you for a visit with your PCP next week and an annual wellness visit as well.  If we did any lab work today, and the results require attention, either me or my nurse will get in touch with you. If everything is normal, you will get a letter in mail and a message via . If you don't hear from [place] in two weeks, please give [place] a call. Otherwise, we look forward to seeing you again at your next visit. If you have any questions or concerns before then, please call the clinic at [phone].  Please bring all your medications to every doctors visit  Sign up for My Chart to have easy access to your labs results, and communication with your Primary care physician. Please ask Front Desk for some assistance.   Please check-out at the front desk before leaving the clinic.    Take Care,   Dr. Andy Gauss

## 2017-07-03 NOTE — Progress Notes (Signed)
   Subjective:    Patient ID: Heather [name], female    DOB: 09-13-1918, 83 y.o.   MRN: [id number]   CC: LLQ pain  HPI: Patient is 83 yo female who presents today complaining of LLQ pain. This problem has been chronic and was worked up by GI and GYN with no abnormal findings explaining abdominal pain. Patient was told that pain was likely secondary to refer pain from her left hip osteoarthritis. Patient was started on Mobic and baclofen and reports no changes in symptoms, she still endorse moderate pain in her LLQ. She denies any nausea, vomiting, constipation, diarrhea, or blood in her stools. Patient is currently on Miralax for history of constipation.  Smoking status reviewed   ROS: all other systems were reviewed and are negative other than in the HPI   Past Medical History:  Diagnosis Date  . Allergic rhinitis, cause unspecified 02/21/2014  . Anxiety state, unspecified   . Breast cancer ([place])    NO STICK OR BLOOD PRESSURE CHECKS IN LEFT ARM  . Carpal tunnel syndrome   . Cervical spondylosis   . Colon polyp 12/19/2009   Transverse-polypoid colorectal mucosa  . Constipation   . Degenerative joint disease   . Diastolic dysfunction   . Diverticula of colon   . Gastritis, chronic   . Hiatal hernia   . Hypertension   . Low back pain   . RBBB (right bundle branch block with left anterior fascicular block)   . Renal cyst   . Venous insufficiency     Past Surgical History:  Procedure Laterality Date  . CARPAL TUNNEL RELEASE    . Left mastectomy      Objective:  BP 118/60   Pulse 72   Temp 99.2 F (37.3 C) (Oral)   Ht 5\' 2"  (1.575 m)   Wt 121 lb 9.6 oz (55.2 kg)   SpO2 96%   BMI 22.24 kg/m   Vitals and nursing note reviewed  General: NAD, pleasant, able to participate in exam Cardiac: RRR, normal heart sounds, no murmurs. 2+ radial and PT pulses bilaterally Respiratory: CTAB, normal effort, No wheezes, rales or rhonchi Abdomen: soft, mild tenderness on palpation  in LLQ pain with radiation to her left flank and back, nondistended, no hepatic or splenomegaly, +BS Extremities: no edema or cyanosis. WWP. Skin: warm and dry, no rashes noted Neuro: alert and oriented x4, no focal deficits Psych: Normal affect and mood   Assessment & Plan:   #LLQ pain, chronic, stable Patient with documented history of LLQ pain with extensive GI and GYN work up with no concerning findings. Pain has not change and likely secondary to worsening arthritic disease from left hip. It is also possible that patient could have compression vertebral fracture or rib fracture around T-10 that could be causing some of the pain she is experiencing. Minimal concern for diverticular disease or hernia. --Discontinue NSAIDs and baclofen given minimal improvement and patient age --Schedule follow up visit with PCP for next week --Scheduled annual wellness visit  Marjie Skiff, MD [facility] PGY-2

## 2017-07-06 DIAGNOSIS — R1084 Generalized abdominal pain: Secondary | ICD-10-CM | POA: Diagnosis not present

## 2017-07-06 DIAGNOSIS — K219 Gastro-esophageal reflux disease without esophagitis: Secondary | ICD-10-CM | POA: Diagnosis not present

## 2017-07-06 DIAGNOSIS — K5900 Constipation, unspecified: Secondary | ICD-10-CM | POA: Diagnosis not present

## 2017-07-06 DIAGNOSIS — R1310 Dysphagia, unspecified: Secondary | ICD-10-CM | POA: Diagnosis not present

## 2017-07-07 NOTE — Progress Notes (Signed)
   Subjective:   Patient ID: Heather [name]    DOB: 18-Jun-1918, 83 y.o. female   MRN: [id number]  CC: "Chronic pain"  HPI: Heather [name] is a 83 y.o. female who presents to clinic today for chronic pain. Problems discussed today are as follows:  Chronic pain: Patient continues to experience chronic pain without worsening severity throughout spine and near left hip. She denies improvement with use of Mobic and baclofen. Patient recently seen in ED and found to have 2 mm nephrolithiasis without obstruction. Patient continues to urinate at baseline without difficulty. She remains ambulatory with walker and his cared for by best friend and son. Patient able to do ADLs without increased difficulty. ROS: Denies fevers or chills, nausea or vomiting, saddle anesthesia, loss of bowel or bladder, hematuria, melena or hematochezia.  Complete ROS performed, see HPI for pertinent.  [place]: HTN, HFpEF (EF 55-65%, 2010), history of breast cancer (1994, s/p L-mastectomy), renal cyst. Smoking status reviewed. Medications reviewed.  Objective:   BP 128/62   Pulse 77   Temp 97.6 F (36.4 C) (Oral)   Ht 5\' 1"  (1.549 m)   Wt 118 lb (53.5 kg)   SpO2 94%   BMI 22.30 kg/m  Vitals and nursing note reviewed.  General: frail elderly woman with walker, well nourished, well developed, in no acute distress with non-toxic appearance CV: regular rate and rhythm without murmurs, rubs, or gallops, no lower extremity edema Lungs: clear to auscultation bilaterally with normal work of breathing Abdomen: soft, non-tender, non-distended, no masses or organomegaly palpable, normoactive bowel sounds Skin: warm, dry, cap refill < 2 seconds, no dermatomal rash or lesions MSK: tender throughout paraspinal region mainly at thoracolumbar region without scoliosis, tender at left hip with deep palpation, able to ambulate without difficulty using walker Psych: euthymic mood congruent affect  Assessment & Plan:    Osteoarthritis Chronic. Diffuse specifically at lower spine and left hip. Pain likely from OA though recent 2 mm nephrolithiasis may be source of pain though less likely given nonobstructive pattern. Minimal improvement with mobility and baclofen. Patient high risk for falls.  --Discussed risks and benefits with agreement to trial naproxen 500 mg twice a day for 4 weeks --Will check BMET in 2 weeks to reassess kidney function --RTC 4 weeks to reassess pain level, would consider tramadol given risk for further kidney damage with naproxen  Orders Placed This Encounter  Procedures  . Basic metabolic panel    Standing Status:   Future    Standing Expiration Date:   07/09/2018   Meds ordered this encounter  Medications  . naproxen (NAPROSYN) 500 MG tablet    Sig: Take 1 tablet (500 mg total) by mouth 2 (two) times daily with a meal.    Dispense:  60 tablet    Refill:  0    [name], DO [facility], PGY-2 07/09/2017 4:50 PM

## 2017-07-08 ENCOUNTER — Telehealth: Payer: Self-pay | Admitting: Licensed Clinical Social Worker

## 2017-07-08 NOTE — Progress Notes (Signed)
[facility] f/u phone call to patient reference connecting to resources provided during joint visit with PCP.  Left voice message to call LCSW.  Plan: LCSW will wait for return call, if no return call is received, will call in 3 to 5 business days  Casimer Lanius, LCSW Licensed Clinical Social Worker [place]   [phone] 12:02 PM   .

## 2017-07-09 ENCOUNTER — Ambulatory Visit (INDEPENDENT_AMBULATORY_CARE_PROVIDER_SITE_OTHER): Payer: Medicare Other | Admitting: *Deleted

## 2017-07-09 ENCOUNTER — Encounter: Payer: Self-pay | Admitting: Family Medicine

## 2017-07-09 ENCOUNTER — Encounter: Payer: Self-pay | Admitting: *Deleted

## 2017-07-09 ENCOUNTER — Ambulatory Visit (INDEPENDENT_AMBULATORY_CARE_PROVIDER_SITE_OTHER): Payer: Medicare Other | Admitting: Family Medicine

## 2017-07-09 VITALS — BP 128/62 | HR 77 | Temp 97.6°F | Ht 61.0 in | Wt 118.0 lb

## 2017-07-09 DIAGNOSIS — Z0000 Encounter for general adult medical examination without abnormal findings: Secondary | ICD-10-CM | POA: Diagnosis not present

## 2017-07-09 DIAGNOSIS — I10 Essential (primary) hypertension: Secondary | ICD-10-CM

## 2017-07-09 DIAGNOSIS — M159 Polyosteoarthritis, unspecified: Secondary | ICD-10-CM | POA: Diagnosis not present

## 2017-07-09 MED ORDER — NAPROXEN 500 MG PO TABS: 500.0000 mg | ORAL_TABLET | Freq: Two times a day (BID) | ORAL | 0 refills | Status: DC

## 2017-07-09 NOTE — Assessment & Plan Note (Addendum)
Chronic. Diffuse specifically at lower spine and left hip. Pain likely from OA though recent 2 mm nephrolithiasis may be source of pain though less likely given nonobstructive pattern. Minimal improvement with mobility and baclofen. Patient high risk for falls.  --Discussed risks and benefits with agreement to trial naproxen 500 mg twice a day for 4 weeks --Will check BMET in 2 weeks to reassess kidney function --RTC 4 weeks to reassess pain level, would consider tramadol given risk for further kidney damage with naproxen

## 2017-07-09 NOTE — Progress Notes (Signed)
Addendum: I have reviewed this visit and discussed with Howell Rucks, RN, BSN, and agree with her documentation.  -- [name], [place], PGY-2

## 2017-07-09 NOTE — Patient Instructions (Signed)
Thank you for coming in to see Heather [name] today. Please see below to review our plan for today's visit.  Your pain is likely from her osteoarthritis. We did find a very small kidney stone on the left side which could also contribute to your pain though this is less likely. I will start you on a new medication called Naproxen. Take 1 tablet twice daily with meals. You can also take Tylenol at the same time as her naproxen. Please be aware that this can damage her kidneys, so you will need to be sure to let Heather [name] know if you have any change in her urine. In addition this can cause GI bleeds so let Heather [name] know if you have blood in her stool or urine. I will have a follow-up in 4 weeks to reassess how you're doing.  Please call the clinic at [phone] if your symptoms worsen or you have any concerns. It was my pleasure to see you. -- Heather [name], [place], PGY-2  Naproxen delayed-release tablets What is this medicine? NAPROXEN (na PROX en) is a non-steroidal anti-inflammatory drug (NSAID). It is used to reduce swelling and to treat pain. This medicine may be used for dental pain, headache, or painful monthly periods. It is also used for painful joint and muscular problems such as arthritis, tendinitis, bursitis, and gout. This medicine may be used for other purposes; ask your health care provider or pharmacist if you have questions. COMMON BRAND NAME(S): EC-Naprosyn What should I tell my health care provider before I take this medicine? They need to know if you have any of these conditions: -asthma -cigarette smoker -drink more than 3 alcohol containing drinks a day -heart disease or circulation problems such as heart failure or leg edema (fluid retention) -high blood pressure -kidney disease -liver disease -stomach bleeding or ulcers -an unusual or allergic reaction to naproxen, aspirin, other NSAIDs, other medicines, foods, dyes, or preservatives -pregnant or trying to get  pregnant -breast-feeding How should I use this medicine? Take this medicine by mouth with a glass of water. Follow the directions on the prescription label. Do not cut, crush or chew this medicine. Take it with food if your stomach gets upset. Try to not lie down for at least 10 minutes after you take it. Take your medicine at regular intervals. Do not take your medicine more often than directed. Long-term, continuous use may increase the risk of heart attack or stroke. A special MedGuide will be given to you by the pharmacist with each prescription and refill. Be sure to read this information carefully each time. Talk to your pediatrician regarding the use of this medicine in children. Special care may be needed. Overdosage: If you think you have taken too much of this medicine contact a poison control center or emergency room at once. NOTE: This medicine is only for you. Do not share this medicine with others. What if I miss a dose? If you miss a dose, take it as soon as you can. If it is almost time for your next dose, take only that dose. Do not take double or extra doses. What may interact with this medicine? -alcohol -antacids -aspirin -cidofovir -diuretics -lithium -medicines for stomach, or intestine problems, like acid reflux or GERD -methotrexate -other drugs for inflammation like ketorolac or prednisone -pemetrexed -probenecid -sucralfate -warfarin This list may not describe all possible interactions. Give your health care provider a list of all the medicines, herbs, non-prescription drugs, or dietary supplements you use.  Also tell them if you smoke, drink alcohol, or use illegal drugs. Some items may interact with your medicine. What should I watch for while using this medicine? Tell your doctor or health care professional if your pain does not get better. Talk to your doctor before taking another medicine for pain. Do not treat yourself. This medicine does not prevent heart  attack or stroke. In fact, this medicine may increase the chance of a heart attack or stroke. The chance may increase with longer use of this medicine and in people who have heart disease. If you take aspirin to prevent heart attack or stroke, talk with your doctor or health care professional. Do not take other medicines that contain aspirin, ibuprofen, or naproxen with this medicine. Side effects such as stomach upset, nausea, or ulcers may be more likely to occur. Many medicines available without a prescription should not be taken with this medicine. This medicine can cause ulcers and bleeding in the stomach and intestines at any time during treatment. Do not smoke cigarettes or drink alcohol. These increase irritation to your stomach and can make it more susceptible to damage from this medicine. Ulcers and bleeding can happen without warning symptoms and can cause death. You may get drowsy or dizzy. Do not drive, use machinery, or do anything that needs mental alertness until you know how this medicine affects you. Do not stand or sit up quickly, especially if you are an older patient. This reduces the risk of dizzy or fainting spells. This medicine can cause you to bleed more easily. Try to avoid damage to your teeth and gums when you brush or floss your teeth. What side effects may I notice from receiving this medicine? Side effects that you should report to your doctor or health care professional as soon as possible: -black or bloody stools, blood in the urine or vomit -blurred vision -chest pain -difficulty breathing or wheezing -nausea or vomiting -skin rash, skin redness, blistering or peeling skin, hives, or itching -slurred speech or weakness on one side of the body -swelling of eyelids, throat, lips -unexplained weight gain or swelling -unusually weak or tired -yellowing of eyes or skin Side effects that usually do not require medical attention (report to your doctor or health care  professional if they continue or are bothersome): -constipation -headache -heartburn This list may not describe all possible side effects. Call your doctor for medical advice about side effects. You may report side effects to FDA at 1-800-FDA-1088. Where should I keep my medicine? Keep out of the reach of children. Store at room temperature between 15 and 30 degrees C (59 and 86 degrees F). Keep container tightly closed. Throw away any unused medicine after the expiration date. NOTE: This sheet is a summary. It may not cover all possible information. If you have questions about this medicine, talk to your doctor, pharmacist, or health care provider.  2018 Elsevier/Gold Standard (2009-11-05 20:20:30)

## 2017-07-09 NOTE — Patient Instructions (Addendum)
Heather [name],  Thank you for taking time to come for yourMedicare Wellness Visit. I appreciate your ongoing commitment to your health goals. Please review the following plan we discussed and let me know if I can assist you in the future.   These are the goals we discussed:  Goals    . Keep walking 10 minutes daily         Fall Prevention in the Home Falls can cause injuries. They can happen to people of all ages. There are many things you can do to make your home safe and to help prevent falls. What can I do on the outside of my home?  Regularly fix the edges of walkways and driveways and fix any cracks.  Remove anything that might make you trip as you walk through a door, such as a raised step or threshold.  Trim any bushes or trees on the path to your home.  Use bright outdoor lighting.  Clear any walking paths of anything that might make someone trip, such as rocks or tools.  Regularly check to see if handrails are loose or broken. Make sure that both sides of any steps have handrails.  Any raised decks and porches should have guardrails on the edges.  Have any leaves, snow, or ice cleared regularly.  Use sand or salt on walking paths during winter.  Clean up any spills in your garage right away. This includes oil or grease spills. What can I do in the bathroom?  Use night lights.  Install grab bars by the toilet and in the tub and shower. Do not use towel bars as grab bars.  Use non-skid mats or decals in the tub or shower.  If you need to sit down in the shower, use a plastic, non-slip stool.  Keep the floor dry. Clean up any water that spills on the floor as soon as it happens.  Remove soap buildup in the tub or shower regularly.  Attach bath mats securely with double-sided non-slip rug tape.  Do not have throw rugs and other things on the floor that can make you trip. What can I do in the bedroom?  Use night lights.  Make sure that you have a light by  your bed that is easy to reach.  Do not use any sheets or blankets that are too big for your bed. They should not hang down onto the floor.  Have a firm chair that has side arms. You can use this for support while you get dressed.  Do not have throw rugs and other things on the floor that can make you trip. What can I do in the kitchen?  Clean up any spills right away.  Avoid walking on wet floors.  Keep items that you use a lot in easy-to-reach places.  If you need to reach something above you, use a strong step stool that has a grab bar.  Keep electrical cords out of the way.  Do not use floor polish or wax that makes floors slippery. If you must use wax, use non-skid floor wax.  Do not have throw rugs and other things on the floor that can make you trip. What can I do with my stairs?  Do not leave any items on the stairs.  Make sure that there are handrails on both sides of the stairs and use them. Fix handrails that are broken or loose. Make sure that handrails are as long as the stairways.  Check any carpeting to  make sure that it is firmly attached to the stairs. Fix any carpet that is loose or worn.  Avoid having throw rugs at the top or bottom of the stairs. If you do have throw rugs, attach them to the floor with carpet tape.  Make sure that you have a light switch at the top of the stairs and the bottom of the stairs. If you do not have them, ask someone to add them for you. What else can I do to help prevent falls?  Wear shoes that: ? Do not have high heels. ? Have rubber bottoms. ? Are comfortable and fit you well. ? Are closed at the toe. Do not wear sandals.  If you use a stepladder: ? Make sure that it is fully opened. Do not climb a closed stepladder. ? Make sure that both sides of the stepladder are locked into place. ? Ask someone to hold it for you, if possible.  Clearly mark and make sure that you can see: ? Any grab bars or handrails. ? First and  last steps. ? Where the edge of each step is.  Use tools that help you move around (mobility aids) if they are needed. These include: ? Canes. ? Walkers. ? Scooters. ? Crutches.  Turn on the lights when you go into a dark area. Replace any light bulbs as soon as they burn out.  Set up your furniture so you have a clear path. Avoid moving your furniture around.  If any of your floors are uneven, fix them.  If there are any pets around you, be aware of where they are.  Review your medicines with your doctor. Some medicines can make you feel dizzy. This can increase your chance of falling. Ask your doctor what other things that you can do to help prevent falls. This information is not intended to replace advice given to you by your health care provider. Make sure you discuss any questions you have with your health care provider. Document Released: 08/30/2009 Document Revised: 04/10/2016 Document Reviewed: 12/08/2014 Elsevier Interactive Patient Education  2018 [place] Maintenance, Female Adopting a healthy lifestyle and getting preventive care can go a long way to promote health and wellness. Talk with your health care provider about what schedule of regular examinations is right for you. This is a good chance for you to check in with your provider about disease prevention and staying healthy. In between checkups, there are plenty of things you can do on your own. Experts have done a lot of research about which lifestyle changes and preventive measures are most likely to keep you healthy. Ask your health care provider for more information. Weight and diet Eat a healthy diet  Be sure to include plenty of vegetables, fruits, low-fat dairy products, and lean protein.  Do not eat a lot of foods high in solid fats, added sugars, or salt.  Get regular exercise. This is one of the most important things you can do for your health. ? Most adults should exercise for at least 150  minutes each week. The exercise should increase your heart rate and make you sweat (moderate-intensity exercise). ? Most adults should also do strengthening exercises at least twice a week. This is in addition to the moderate-intensity exercise.  Maintain a healthy weight  Body mass index (BMI) is a measurement that can be used to identify possible weight problems. It estimates body fat based on height and weight. Your health care provider can help determine  your BMI and help you achieve or maintain a healthy weight.  For females 47 years of age and older: ? A BMI below 18.5 is considered underweight. ? A BMI of 18.5 to 24.9 is normal. ? A BMI of 25 to 29.9 is considered overweight. ? A BMI of 30 and above is considered obese.  Watch levels of cholesterol and blood lipids  You should start having your blood tested for lipids and cholesterol at 81 years of age, then have this test every 5 years.  You may need to have your cholesterol levels checked more often if: ? Your lipid or cholesterol levels are high. ? You are older than 81 years of age. ? You are at high risk for heart disease.  Cancer screening Lung Cancer  Lung cancer screening is recommended for adults 67-57 years old who are at high risk for lung cancer because of a history of smoking.  A yearly low-dose CT scan of the lungs is recommended for people who: ? Currently smoke. ? Have quit within the past 15 years. ? Have at least a 30-pack-year history of smoking. A pack year is smoking an average of one pack of cigarettes a day for 1 year.  Yearly screening should continue until it has been 15 years since you quit.  Yearly screening should stop if you develop a health problem that would prevent you from having lung cancer treatment.  Breast Cancer  Practice breast self-awareness. This means understanding how your breasts normally appear and feel.  It also means doing regular breast self-exams. Let your health care  provider know about any changes, no matter how small.  If you are in your 20s or 30s, you should have a clinical breast exam (CBE) by a health care provider every 1-3 years as part of a regular health exam.  If you are 63 or older, have a CBE every year. Also consider having a breast X-ray (mammogram) every year.  If you have a family history of breast cancer, talk to your health care provider about genetic screening.  If you are at high risk for breast cancer, talk to your health care provider about having an MRI and a mammogram every year.  Breast cancer gene (BRCA) assessment is recommended for women who have family members with BRCA-related cancers. BRCA-related cancers include: ? Breast. ? Ovarian. ? Tubal. ? Peritoneal cancers.  Results of the assessment will determine the need for genetic counseling and BRCA1 and BRCA2 testing.  Cervical Cancer Your health care provider may recommend that you be screened regularly for cancer of the pelvic organs (ovaries, uterus, and vagina). This screening involves a pelvic examination, including checking for microscopic changes to the surface of your cervix (Pap test). You may be encouraged to have this screening done every 3 years, beginning at age 17.  For women ages 22-65, health care providers may recommend pelvic exams and Pap testing every 3 years, or they may recommend the Pap and pelvic exam, combined with testing for human papilloma virus (HPV), every 5 years. Some types of HPV increase your risk of cervical cancer. Testing for HPV may also be done on women of any age with unclear Pap test results.  Other health care providers may not recommend any screening for nonpregnant women who are considered low risk for pelvic cancer and who do not have symptoms. Ask your health care provider if a screening pelvic exam is right for you.  If you have had past treatment for cervical cancer  or a condition that could lead to cancer, you need Pap tests  and screening for cancer for at least 20 years after your treatment. If Pap tests have been discontinued, your risk factors (such as having a new sexual partner) need to be reassessed to determine if screening should resume. Some women have medical problems that increase the chance of getting cervical cancer. In these cases, your health care provider may recommend more frequent screening and Pap tests.  Colorectal Cancer  This type of cancer can be detected and often prevented.  Routine colorectal cancer screening usually begins at 81 years of age and continues through 81 years of age.  Your health care provider may recommend screening at an earlier age if you have risk factors for colon cancer.  Your health care provider may also recommend using home test kits to check for hidden blood in the stool.  A small camera at the end of a tube can be used to examine your colon directly (sigmoidoscopy or colonoscopy). This is done to check for the earliest forms of colorectal cancer.  Routine screening usually begins at age 32.  Direct examination of the colon should be repeated every 5-10 years through 81 years of age. However, you may need to be screened more often if early forms of precancerous polyps or small growths are found.  Skin Cancer  Check your skin from head to toe regularly.  Tell your health care provider about any new moles or changes in moles, especially if there is a change in a mole's shape or color.  Also tell your health care provider if you have a mole that is larger than the size of a pencil eraser.  Always use sunscreen. Apply sunscreen liberally and repeatedly throughout the day.  Protect yourself by wearing long sleeves, pants, a wide-brimmed hat, and sunglasses whenever you are outside.  Heart disease, diabetes, and high blood pressure  High blood pressure causes heart disease and increases the risk of stroke. High blood pressure is more likely to develop  in: ? People who have blood pressure in the high end of the normal range (130-139/85-89 mm Hg). ? People who are overweight or obese. ? People who are African American.  If you are 79-39 years of age, have your blood pressure checked every 3-5 years. If you are 36 years of age or older, have your blood pressure checked every year. You should have your blood pressure measured twice-once when you are at a hospital or clinic, and once when you are not at a hospital or clinic. Record the average of the two measurements. To check your blood pressure when you are not at a hospital or clinic, you can use: ? An automated blood pressure machine at a pharmacy. ? A home blood pressure monitor.  If you are between 28 years and 40 years old, ask your health care provider if you should take aspirin to prevent strokes.  Have regular diabetes screenings. This involves taking a blood sample to check your fasting blood sugar level. ? If you are at a normal weight and have a low risk for diabetes, have this test once every three years after 81 years of age. ? If you are overweight and have a high risk for diabetes, consider being tested at a younger age or more often. Preventing infection Hepatitis B  If you have a higher risk for hepatitis B, you should be screened for this virus. You are considered at high risk for hepatitis B if: ?  You were born in a country where hepatitis B is common. Ask your health care provider which countries are considered high risk. ? Your parents were born in a high-risk country, and you have not been immunized against hepatitis B (hepatitis B vaccine). ? You have HIV or AIDS. ? You use needles to inject street drugs. ? You live with someone who has hepatitis B. ? You have had sex with someone who has hepatitis B. ? You get hemodialysis treatment. ? You take certain medicines for conditions, including cancer, organ transplantation, and autoimmune conditions.  Hepatitis C  Blood  testing is recommended for: ? Everyone born from [birth year] through 1963. ? Anyone with known risk factors for hepatitis C.  Sexually transmitted infections (STIs)  You should be screened for sexually transmitted infections (STIs) including gonorrhea and chlamydia if: ? You are sexually active and are younger than 81 years of age. ? You are older than 81 years of age and your health care provider tells you that you are at risk for this type of infection. ? Your sexual activity has changed since you were last screened and you are at an increased risk for chlamydia or gonorrhea. Ask your health care provider if you are at risk.  If you do not have HIV, but are at risk, it may be recommended that you take a prescription medicine daily to prevent HIV infection. This is called pre-exposure prophylaxis (PrEP). You are considered at risk if: ? You are sexually active and do not regularly use condoms or know the HIV status of your partner(s). ? You take drugs by injection. ? You are sexually active with a partner who has HIV.  Talk with your health care provider about whether you are at high risk of being infected with HIV. If you choose to begin PrEP, you should first be tested for HIV. You should then be tested every 3 months for as long as you are taking PrEP. Pregnancy  If you are premenopausal and you may become pregnant, ask your health care provider about preconception counseling.  If you may become pregnant, take 400 to 800 micrograms (mcg) of folic acid every day.  If you want to prevent pregnancy, talk to your health care provider about birth control (contraception). Osteoporosis and menopause  Osteoporosis is a disease in which the bones lose minerals and strength with aging. This can result in serious bone fractures. Your risk for osteoporosis can be identified using a bone density scan.  If you are 61 years of age or older, or if you are at risk for osteoporosis and fractures, ask your  health care provider if you should be screened.  Ask your health care provider whether you should take a calcium or vitamin D supplement to lower your risk for osteoporosis.  Menopause may have certain physical symptoms and risks.  Hormone replacement therapy may reduce some of these symptoms and risks. Talk to your health care provider about whether hormone replacement therapy is right for you. Follow these instructions at home:  Schedule regular health, dental, and eye exams.  Stay current with your immunizations.  Do not use any tobacco products including cigarettes, chewing tobacco, or electronic cigarettes.  If you are pregnant, do not drink alcohol.  If you are breastfeeding, limit how much and how often you drink alcohol.  Limit alcohol intake to no more than 1 drink per day for nonpregnant women. One drink equals 12 ounces of beer, 5 ounces of wine, or 1 ounces of  hard liquor.  Do not use street drugs.  Do not share needles.  Ask your health care provider for help if you need support or information about quitting drugs.  Tell your health care provider if you often feel depressed.  Tell your health care provider if you have ever been abused or do not feel safe at home. This information is not intended to replace advice given to you by your health care provider. Make sure you discuss any questions you have with your health care provider. Document Released: 05/19/2011 Document Revised: 04/10/2016 Document Reviewed: 08/07/2015 Elsevier Interactive Patient Education  [employer].

## 2017-07-09 NOTE — Progress Notes (Signed)
Subjective:   Heather [name] is a 83 y.o. female who presents with friend, Kalman Shan, for an Initial [employer] Visit.   Cardiac Risk Factors include: advanced age (>68men, >69 women);hypertension     Objective:    Today's Vitals   07/09/17 1357  BP: 128/62  Pulse: 77  Temp: 97.6 F (36.4 C)  TempSrc: Oral  SpO2: 94%  Weight: 118 lb (53.5 kg)  Height: 5\' 1"  (1.549 m)   Body mass index is 22.3 kg/m.   Current Medications (verified) Outpatient Encounter Prescriptions as of 07/09/2017  Medication Sig  . carvedilol (COREG) 3.125 MG tablet Take 1 tablet (3.125 mg total) by mouth 2 (two) times daily.  . Cholecalciferol (VITAMIN D3) 1000 units CAPS Take 1 capsule (1,000 Units total) by mouth daily. For your bones  . feeding supplement, ENSURE ENLIVE, (ENSURE ENLIVE) LIQD Take 237 mLs by mouth 2 (two) times daily between meals.  . furosemide (LASIX) 20 MG tablet Take 1 tablet (20 mg total) by mouth daily.  . hydroxypropyl methylcellulose / hypromellose (ISOPTO TEARS / GONIOVISC) 2.5 % ophthalmic solution Place 1 drop into both eyes 3 (three) times daily as needed for dry eyes.  Marland Kitchen levocetirizine (XYZAL) 5 MG tablet Take 1 tablet (5 mg total) by mouth every evening.  . Multiple Vitamins-Minerals (MULTIVITAMINS THER. W/MINERALS) TABS Take 1 tablet by mouth daily.  . pantoprazole (PROTONIX) 40 MG tablet Take 1 tablet (40 mg total) by mouth 2 (two) times daily.  . polyethylene glycol powder (GLYCOLAX/MIRALAX) powder Take 17 g by mouth daily as needed. (Patient taking differently: Take 17 g by mouth daily as needed for moderate constipation. )  . spironolactone (ALDACTONE) 25 MG tablet Take 1 tablet (25 mg total) by mouth daily.   No facility-administered encounter medications on file as of 07/09/2017.     Allergies (verified) Fentanyl   History: Past Medical History:  Diagnosis Date  . Allergic rhinitis, cause unspecified 02/21/2014  . Anxiety state, unspecified   .  Breast cancer ([place])    NO STICK OR BLOOD PRESSURE CHECKS IN LEFT ARM  . Carpal tunnel syndrome   . Cervical spondylosis   . Colon polyp 12/19/2009   Transverse-polypoid colorectal mucosa  . Constipation   . Degenerative joint disease   . Diastolic dysfunction   . Diverticula of colon   . Gastritis, chronic   . Hiatal hernia   . Hypertension   . Low back pain   . RBBB (right bundle branch block with left anterior fascicular block)   . Renal cyst   . Venous insufficiency    Past Surgical History:  Procedure Laterality Date  . CARPAL TUNNEL RELEASE    . Left mastectomy     Family History  Problem Relation Age of Onset  . Colon cancer Neg Hx    Social History   Occupational History  . Retired Retired   Social History Main Topics  . Smoking status: Never Smoker  . Smokeless tobacco: Never Used  . Alcohol use No  . Drug use: No  . Sexual activity: Not on file    Tobacco Counseling Counseling given: Yes Patient has never smoked and has no plans to start.   Activities of Daily Living In your present state of health, do you have any difficulty performing the following activities: 07/09/2017  Hearing? Y  Vision? Y  Difficulty concentrating or making decisions? Y  Walking or climbing stairs? Y  Dressing or bathing? N  Doing errands, shopping? Darreld Mclean  Preparing Food and eating ? N  Using the Toilet? N  In the past six months, have you accidently leaked urine? Y  Do you have problems with loss of bowel control? N  Managing your Medications? N  Managing your Finances? N  Comment Son double Oncologist or managing your Housekeeping? N  Some recent data might be hidden   Home Safety:  My home has a working smoke alarm:  Yes X 1           My home throw rugs have been fastened down to the floor or removed:  Non-slip backs I have a non-slip surface or non-slip mats in the bathtub and shower:  Non-slip surface and tub transfer bench        All my home's stairs have  handrails, including any outdoor stairs  One level home with 4 outside steps with handrails          My home's floors, stairs and hallways are free from clutter, wires and cords:  Yes     I have animals in my home  No I wear seatbelts consistently:  Yes   Immunizations and Health Maintenance Immunization History  Administered Date(s) Administered  . Influenza,inj,Quad PF,6+ Mos 09/18/2015   There are no preventive care reminders to display for this patient.  Patient Care Team: [place] Bing, DO as PCP - General Peggye Form, MD as Referring Physician (Internal Medicine) Garald Balding, MD as Attending Physician (Orthopedic Surgery) Debbra Riding, MD as Consulting Physician (Ophthalmology) Noralee Space, MD as Consulting Physician (Pulmonary Disease)  Indicate any recent Medical Services you may have received from other than [facility] providers in the past year (date may be approximate).     Assessment:   This is a routine wellness examination for Heather [name].   Hearing/Vision screen  Hearing Screening   Method: Audiometry   125Hz  250Hz  500Hz  1000Hz  2000Hz  3000Hz  4000Hz  6000Hz  8000Hz   Right ear:   Fail Fail Fail  Fail    Left ear:   Fail Fail Fail  Fail      Dietary issues and exercise activities discussed: Current Exercise Habits: Home exercise routine, Type of exercise: walking, Time (Minutes): 10, Frequency (Times/Week): 7, Weekly Exercise (Minutes/Week): 70, Intensity: Mild  Goals    . Keep walking 10 minutes daily      Depression Screen PHQ 2/9 Scores 07/09/2017 07/03/2017 06/25/2017 06/10/2017 05/21/2017 04/24/2017 03/13/2017  PHQ - 2 Score 0 0 0 0 0 0 0    Fall Risk Fall Risk  07/09/2017 07/03/2017 06/25/2017 06/10/2017 05/21/2017  Falls in the past year? No No No No No  Risk for fall due to : - - - - -   TUG Test:  Done in 22 seconds. Patient used one hand to push out of chair only. Patient ambulated with walker. Falls prevention discussed in detail and literature  given.  Cognitive Function: Mini-Cog  Failed with score 2/5  Cognitive Function: MMSE - Mini Mental State Exam 09/30/2016  Orientation to time 5  Orientation to Place 5  Registration 3  Attention/ Calculation 0  Recall 0  Language- name 2 objects 2  Language- repeat 1  Language- follow 3 step command 3  Language- read & follow direction 1  Write a sentence 1  Copy design 1  Total score 22   Screening Tests Health Maintenance  Topic Date Due  . INFLUENZA VACCINE  09/17/2017 (Originally 06/17/2017)  . TETANUS/TDAP  09/17/2017 (Originally 06/13/1937)  . PNA vac  Low Risk Adult (1 of 2 - PCV13) 09/17/2017 (Originally 06/12/1985)  . DEXA SCAN  Completed   Patient declined flu vaccine today     Plan:     I have personally reviewed and noted the following in the patient's chart:   . Medical and social history . Use of alcohol, tobacco or illicit drugs  . Current medications and supplements . Functional ability and status . Nutritional status . Physical activity . Advanced directives . List of other physicians . Hospitalizations, surgeries, and ER visits in previous 12 months . Vitals . Screenings to include cognitive, depression, and falls . Referrals and appointments  In addition, I have reviewed and discussed with patient certain preventive protocols, quality metrics, and best practice recommendations. A written personalized care plan for preventive services as well as general preventive health recommendations were provided to patient.     Velora Heckler, RN   07/09/2017

## 2017-07-10 ENCOUNTER — Telehealth: Payer: Self-pay | Admitting: Licensed Clinical Social Worker

## 2017-07-10 NOTE — Progress Notes (Signed)
[facility] f/u phone call to patient reference connecting to resources provided during joint visit with PCP.  2nd voice left message for patient or son to call LCSW.  Plan: LCSW will wait for return call  Casimer Lanius, LCSW Licensed Clinical Social Worker [place]   [phone] 3:44 PM   .

## 2017-07-15 ENCOUNTER — Telehealth: Payer: Self-pay | Admitting: *Deleted

## 2017-07-15 NOTE — Telephone Encounter (Signed)
Patient states that when she took the naproxen her face and tongue became swollen.  Advised not to take meds anymore and I would send to Dr. Yisroel Ramming for the next steps. Pt agreeable. Heather [name], Salome Spotted, CMA   Of note, she is taking the tylenol and it helps slightly. Heather [name], Salome Spotted, CMA

## 2017-07-22 ENCOUNTER — Other Ambulatory Visit: Payer: Self-pay | Admitting: Family Medicine

## 2017-07-22 ENCOUNTER — Telehealth: Payer: Self-pay | Admitting: Family Medicine

## 2017-07-22 DIAGNOSIS — G8929 Other chronic pain: Secondary | ICD-10-CM | POA: Insufficient documentation

## 2017-07-22 MED ORDER — ACETAMINOPHEN-CODEINE #2 300-15 MG PO TABS: 1.0000 | ORAL_TABLET | Freq: Three times a day (TID) | ORAL | 0 refills | Status: DC | PRN

## 2017-07-22 NOTE — Telephone Encounter (Signed)
Pt states her face is still swollen even thought she hasnt taken naporxen since thurday.  Her urine is yellow.  She is still in pain. Please advise

## 2017-07-22 NOTE — Telephone Encounter (Signed)
Contacted patient regarding swelling after use of naproxen. Patient is endorsing swelling prior to use and naproxen affecting face, hands, and legs. Patient states she does not believe it's from the naproxen but has not been using it. She denies feelings of chest pain or shortness of breath. Swelling has not been worsening. She believes this is a chronic thing for her. She is requesting an alternative pain medication. Patient requesting Tylenol No. 2 for pain control. Rx filled and patient and/or patient's son will come pick up medication. -- [name], [place], PGY-2

## 2017-07-27 ENCOUNTER — Ambulatory Visit (INDEPENDENT_AMBULATORY_CARE_PROVIDER_SITE_OTHER): Payer: Medicare Other | Admitting: Family Medicine

## 2017-07-27 ENCOUNTER — Ambulatory Visit: Payer: Medicare Other | Admitting: Nurse Practitioner

## 2017-07-27 ENCOUNTER — Other Ambulatory Visit: Payer: Medicare Other

## 2017-07-27 VITALS — BP 132/60 | HR 79 | Temp 98.3°F | Ht 61.0 in | Wt 121.4 lb

## 2017-07-27 DIAGNOSIS — I10 Essential (primary) hypertension: Secondary | ICD-10-CM | POA: Diagnosis not present

## 2017-07-27 DIAGNOSIS — G8929 Other chronic pain: Secondary | ICD-10-CM

## 2017-07-27 NOTE — Progress Notes (Signed)
   Patient needed to leave, appointment rescheduled for Wednesday 07/29/17 at 10:50 AM with Dr. Nori Riis.  Hassell Done, Rosine Beat, RN

## 2017-07-28 ENCOUNTER — Ambulatory Visit: Payer: Medicare Other

## 2017-07-28 ENCOUNTER — Encounter: Payer: Self-pay | Admitting: Family Medicine

## 2017-07-28 LAB — BASIC METABOLIC PANEL
BUN / CREAT RATIO: 17 (ref 12–28)
BUN: 17 mg/dL (ref 10–36)
CALCIUM: 9.4 mg/dL (ref 8.7–10.3)
CHLORIDE: 104 mmol/L (ref 96–106)
CO2: 22 mmol/L (ref 20–29)
CREATININE: 0.99 mg/dL (ref 0.57–1.00)
GFR calc Af Amer: 55 mL/min/{1.73_m2} — ABNORMAL LOW (ref >59)
GFR calc non Af Amer: 48 mL/min/{1.73_m2} — ABNORMAL LOW (ref >59)
GLUCOSE: 107 mg/dL — AB (ref 65–99)
Potassium: 4.4 mmol/L (ref 3.5–5.2)
Sodium: 141 mmol/L (ref 134–144)

## 2017-07-29 ENCOUNTER — Ambulatory Visit (INDEPENDENT_AMBULATORY_CARE_PROVIDER_SITE_OTHER): Payer: Medicare Other | Admitting: Family Medicine

## 2017-07-29 DIAGNOSIS — M159 Polyosteoarthritis, unspecified: Secondary | ICD-10-CM

## 2017-07-29 DIAGNOSIS — F411 Generalized anxiety disorder: Secondary | ICD-10-CM | POA: Diagnosis not present

## 2017-07-29 MED ORDER — TRAMADOL HCL 50 MG PO TABS: 50.0000 mg | ORAL_TABLET | Freq: Two times a day (BID) | ORAL | 5 refills | Status: DC | PRN

## 2017-07-29 MED ORDER — ALPRAZOLAM 0.25 MG PO TABS: 0.2500 mg | ORAL_TABLET | Freq: Every day | ORAL | 1 refills | Status: DC | PRN

## 2017-07-29 MED ORDER — TRAMADOL HCL 50 MG PO TABS: 50.0000 mg | ORAL_TABLET | Freq: Three times a day (TID) | ORAL | 0 refills | Status: DC | PRN

## 2017-07-29 NOTE — Patient Instructions (Signed)
I have given you some tramadol for your pain. I have refilled your 'nerve pill" prescription. It was a pleasure to meet you!

## 2017-07-30 NOTE — Progress Notes (Signed)
    CHIEF COMPLAINT / HPI:    Low back and lef hip pain. Chronic but worsening over the last several months. Her PCP gave her some acetaminophen with codeine which causes some nausea so she was unable to take it. She wants to discuss alternatives. Also has questions about whether or not her kidney stone is causing her problems, or needs to be addressed.  REVIEW OF SYSTEMS:  Low back and left greater than right hip pain 9 out of 10. No fever. No bowel or bladder incontinence. No blood in her urine. No recent falls.  PERTINENT  PMH / PSH: I have reviewed the patient's medications, allergies, past medical and surgical history, smoking status and updated in the EMR as appropriate.   OBJECTIVE:     Vital signs reviewed. GENERAL: Well-developed, thin, elderly female in no acute distress. Appears younger than her stated age. BACK: Nontender to palpation or percussion. No defect. The paraspinal muscles have some mild tenderness to palpation likely on the left. HIPS: Internal and external rotation limited bilaterally. External rotation quite painful on the left. Hip flexor strength 5 out of 5 symmetrical. NEURO: No gross focal neurological deficits are noted. She has intact sensation to soft touch bilateral lower extremities. MSK: Movement of extremity x 4. She is able to rise from a chair with a lot of assistance from her walker. GAIT: Stooped truncal posture, walker dependent for balance. Short stride length. PSYCHIATRIC: Alert and oriented 4. She asks and answers questions appropriately. Interactive.  IMAGING: Left hip x-ray from July 2018 reveals significant osteoarthritis left hip.  ASSESSMENT / PLAN: Please see problem oriented charting for details

## 2017-07-30 NOTE — Assessment & Plan Note (Signed)
Significant hip arthritis. Will try tramadol. Follow-up with PCP.

## 2017-07-30 NOTE — Assessment & Plan Note (Signed)
Understandably hesitation to use a benzodiazepine and patient there is 83 years old. She seems quite functional however and after discussion with her I think she only takes her "nerve pill" once or  twice a week at most. It seems to give her some sense of stability to have some of these medications on hand so I will refill. I have notified PCP.

## 2017-08-03 ENCOUNTER — Telehealth: Payer: Self-pay | Admitting: Family Medicine

## 2017-08-03 NOTE — Telephone Encounter (Signed)
Pt had blood work last week and would like the results.

## 2017-08-04 DIAGNOSIS — N3281 Overactive bladder: Secondary | ICD-10-CM | POA: Diagnosis not present

## 2017-08-04 DIAGNOSIS — R8279 Other abnormal findings on microbiological examination of urine: Secondary | ICD-10-CM | POA: Diagnosis not present

## 2017-08-04 NOTE — Telephone Encounter (Signed)
Please call patient and let her know her kidneys are stable and functioning well. I have sent a letter of her lab results. Thank you.

## 2017-08-05 DIAGNOSIS — K5900 Constipation, unspecified: Secondary | ICD-10-CM | POA: Diagnosis not present

## 2017-08-05 DIAGNOSIS — I10 Essential (primary) hypertension: Secondary | ICD-10-CM | POA: Diagnosis not present

## 2017-08-05 DIAGNOSIS — Z853 Personal history of malignant neoplasm of breast: Secondary | ICD-10-CM | POA: Diagnosis not present

## 2017-08-05 DIAGNOSIS — R1084 Generalized abdominal pain: Secondary | ICD-10-CM | POA: Diagnosis not present

## 2017-08-06 DIAGNOSIS — H02054 Trichiasis without entropian left upper eyelid: Secondary | ICD-10-CM | POA: Diagnosis not present

## 2017-08-06 DIAGNOSIS — H10413 Chronic giant papillary conjunctivitis, bilateral: Secondary | ICD-10-CM | POA: Diagnosis not present

## 2017-08-06 NOTE — Telephone Encounter (Signed)
Pt informed. Norris Bodley, CMA  

## 2017-08-19 ENCOUNTER — Telehealth: Payer: Self-pay | Admitting: *Deleted

## 2017-08-19 NOTE — Telephone Encounter (Signed)
Left message on nurse line needing to speak with regarding her pain.    Return call to pt. Pt stated her pain is worse. The tramadol is not working.  She also wanted to speak with PCP regarding kidney stone.  Advised patient that she should schedule an appointment; 08/20/2017 at 3:30 PM with PCP.  Derl Barrow, RN

## 2017-08-20 ENCOUNTER — Encounter: Payer: Self-pay | Admitting: Family Medicine

## 2017-08-20 ENCOUNTER — Ambulatory Visit (INDEPENDENT_AMBULATORY_CARE_PROVIDER_SITE_OTHER): Payer: Medicare Other | Admitting: Family Medicine

## 2017-08-20 DIAGNOSIS — R1032 Left lower quadrant pain: Secondary | ICD-10-CM | POA: Diagnosis not present

## 2017-08-20 DIAGNOSIS — G8929 Other chronic pain: Secondary | ICD-10-CM

## 2017-08-20 MED ORDER — ACETAMINOPHEN-CODEINE #2 300-15 MG PO TABS: 1.0000 | ORAL_TABLET | Freq: Three times a day (TID) | ORAL | 0 refills | Status: DC | PRN

## 2017-08-20 NOTE — Patient Instructions (Signed)
Thank you for coming in to see [place] today. Please see below to review our plan for today's visit.  1. Discontinue the tramadol. I have prescribed to refill your Tylenol No. 2. Take this as directed. 2. I will go through the notes from Dr. Oletta Lamas to get a better idea of what is going on. 3. Return to the clinic in 2 weeks.  Please call the clinic at [phone] if your symptoms worsen or you have any concerns. It was my pleasure to see you. -- [name], [place], PGY-2

## 2017-08-20 NOTE — Progress Notes (Signed)
   Subjective:   Patient ID: Heather [name]    DOB: 08/02/1918, 83 y.o. female   MRN: [id number]  CC: "Kidney stone"  HPI: Heather [name] is a 83 y.o. female who presents for a same day appointment concerning kidney stone. Problems discussed today are as follows:  Left lower quadrant pain: Ongoing issue for many years with thorough evaluation including GI, pulmonology, orthopedics. Continues to aggravate patient. Worse with standing and improves with sitting. Pain is very superficial. She's been seen 7 times in the past 3 months for the same complaint. She states the pain is unchanged but is worried that it could be related to her 2 mm nonobstructing kidney stone of the left side. She's been taking the tramadol she is prescribed at her last visit without relief. She has been trying different kinds of pain medications over the past few visits with best relief using Tylenol No. 2 for patient's request. She was recently seen by GI Dr. Oletta [name] 1 month ago, however these records are not found. She states she has a regular bowel movement once daily. ROS: Denies fevers or chills, nausea or vomiting, constipation or diarrhea, dysuria, polyuria or early uremia, melena or hematochezia, shortness of breath or chest pain.  Complete ROS performed, see HPI for pertinent.  [place]: HTN, HFpEF (EF 55-65%, 2010), history of breast cancer (1994, s/p L-mastectomy), renal cyst. Family history unremarkable. Smoking status reviewed. Medications reviewed.  Objective:   BP (!) 84/48   Pulse 81   Temp 98.1 F (36.7 C) (Oral)   Ht 5\' 1"  (1.549 m)   Wt 117 lb 9.6 oz (53.3 kg)   SpO2 98%   BMI 22.22 kg/m  Vitals and nursing note reviewed.  General: frail elderly woman in wheelchair, in no acute distress with non-toxic appearance, pleasant HEENT: normocephalic, atraumatic, moist mucous membranes CV: regular rate and rhythm without murmurs, rubs, or gallops, no lower extremity edema Lungs: clear to auscultation  bilaterally with normal work of breathing Abdomen: soft, non-distended, no masses or organomegaly palpable, normoactive bowel sounds, mild tenderness with palpation of left lower quadrant particularly under skin without rebound or guarding Skin: warm, dry, no rashes or lesions, cap refill < 2 seconds Extremities: warm and well perfused, normal tone  Assessment & Plan:   Chronic LLQ pain Chronic. Superficial without abdominal defects. No red flags. 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 Uncertain if this is true though does have a history of diverticulosis. Frequently seen by GI but no records available. Does not seem to be a pulm etiology according to pulmonologist. Has also been evaluated by orthopedics without a source. Unlikely kidney stone given CT findings. Patient is a poor surgical candidate. No red flags. --Reached out to Dr. Oletta [name] office who confirmed patient was seen, ROI signed to obtain records --Discontinued tramadol with prescription for Tylenol No. 2 per patient's request --RTC in 2 weeks  No orders of the defined types were placed in this encounter.  Meds ordered this encounter  Medications  . acetaminophen-codeine (TYLENOL #2) 300-15 MG tablet    Sig: Take 1 tablet by mouth every 8 (eight) hours as needed for moderate pain.    Dispense:  60 tablet    Refill:  0    Heather Butte, DO [place], PGY-2 08/21/2017 11:26 AM

## 2017-08-20 NOTE — Assessment & Plan Note (Addendum)
Chronic. Superficial without abdominal defects. No red flags. 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 Uncertain if this is true though does have a history of diverticulosis. Frequently seen by GI but no records available. Does not seem to be a pulm etiology according to pulmonologist. Has also been evaluated by orthopedics without a source. Unlikely kidney stone given CT findings. Patient is a poor surgical candidate. No red flags. --Reached out to Dr. Oletta Lamas office who confirmed patient was seen, ROI signed to obtain records --Discontinued tramadol with prescription for Tylenol No. 2 per patient's request --RTC in 2 weeks

## 2017-08-21 IMAGING — CR DG RIBS W/ CHEST 3+V*L*
3 series · 3 of 3 positions shown · non-contrast
Comparison: CT scan chest February 12, 2013 and chest x-ray March 22, 2015.

CLINICAL DATA: General widened left anterior rib chest discomfort
for the past several months without history of injury. There is a
past history of left breast malignancy treated with mastectomy

EXAM:
LEFT RIBS AND CHEST - 3+ VIEW

[w chest pa *]
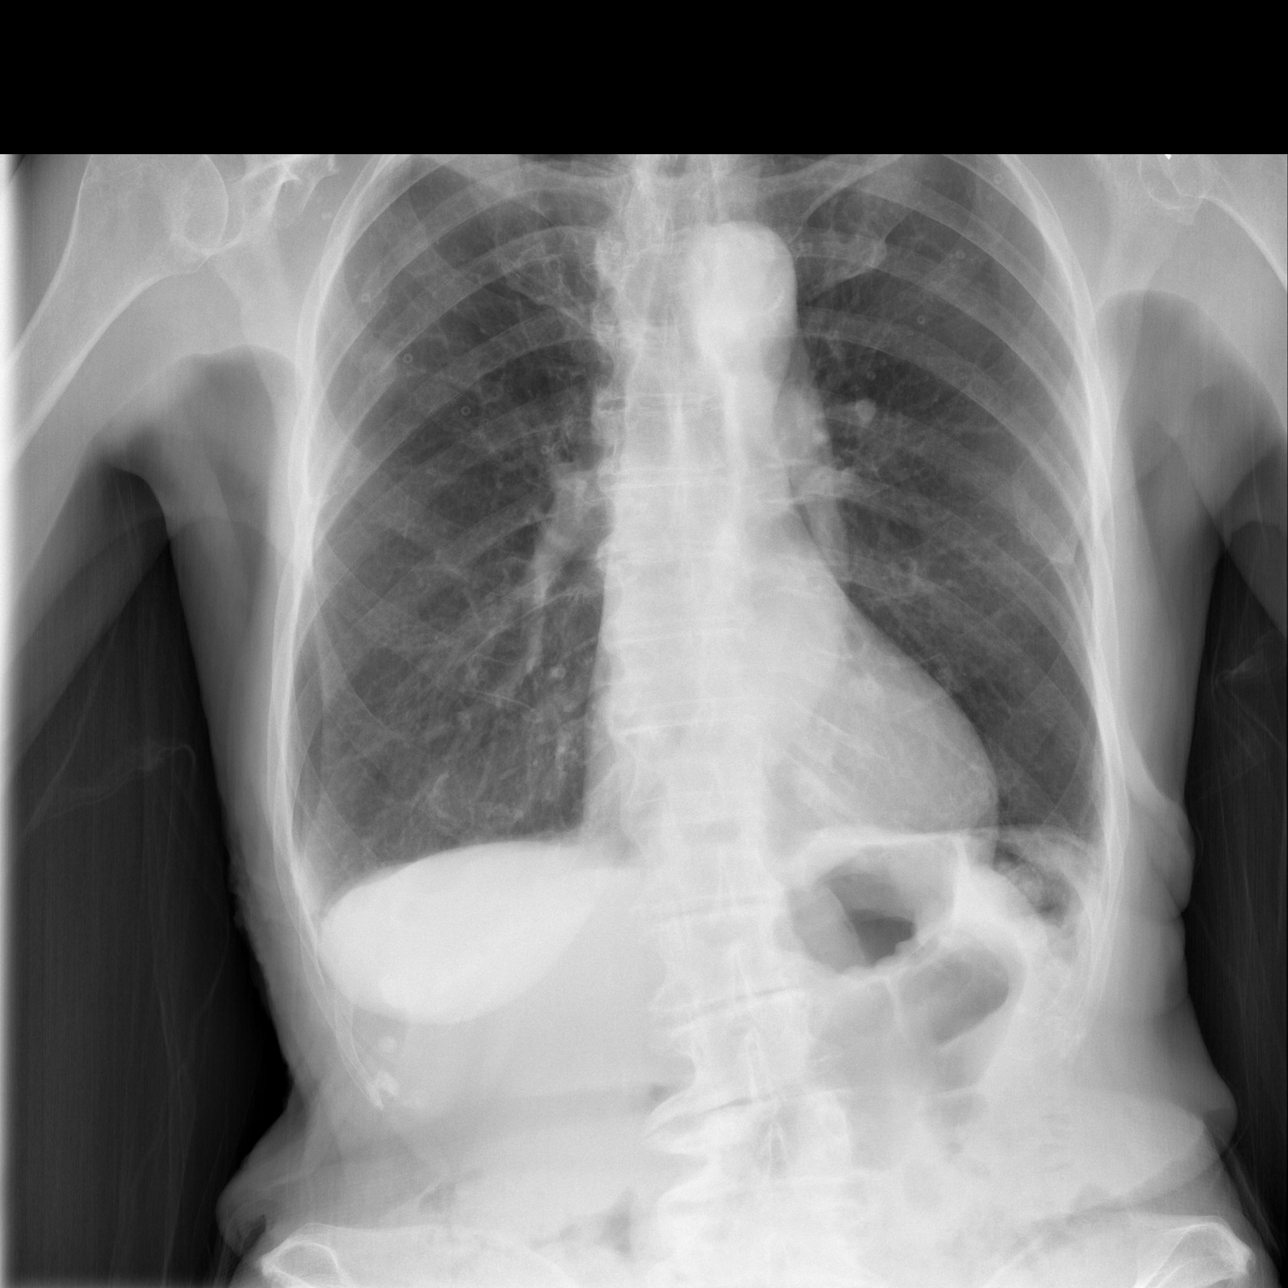

[w ribs ap/pa lower left *]
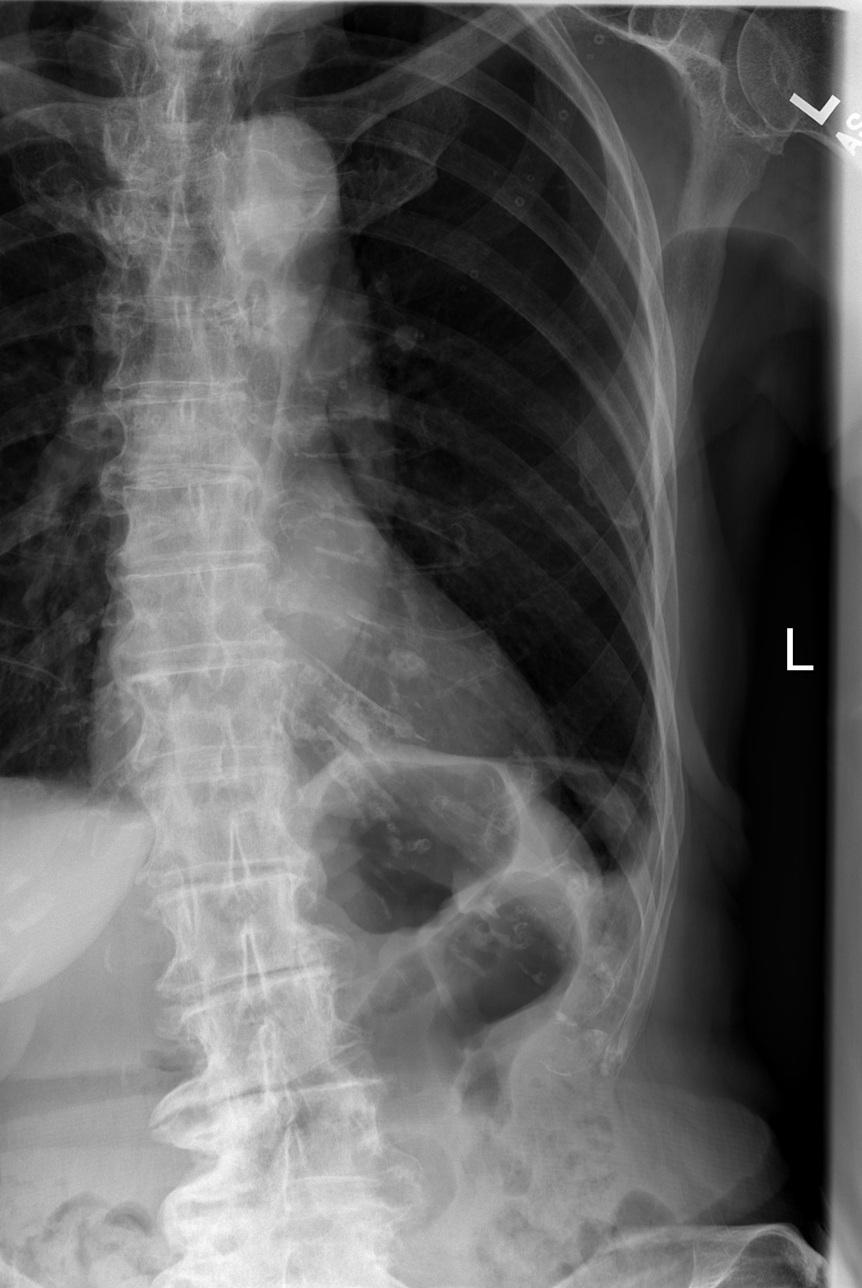

[w ribs oblique left]
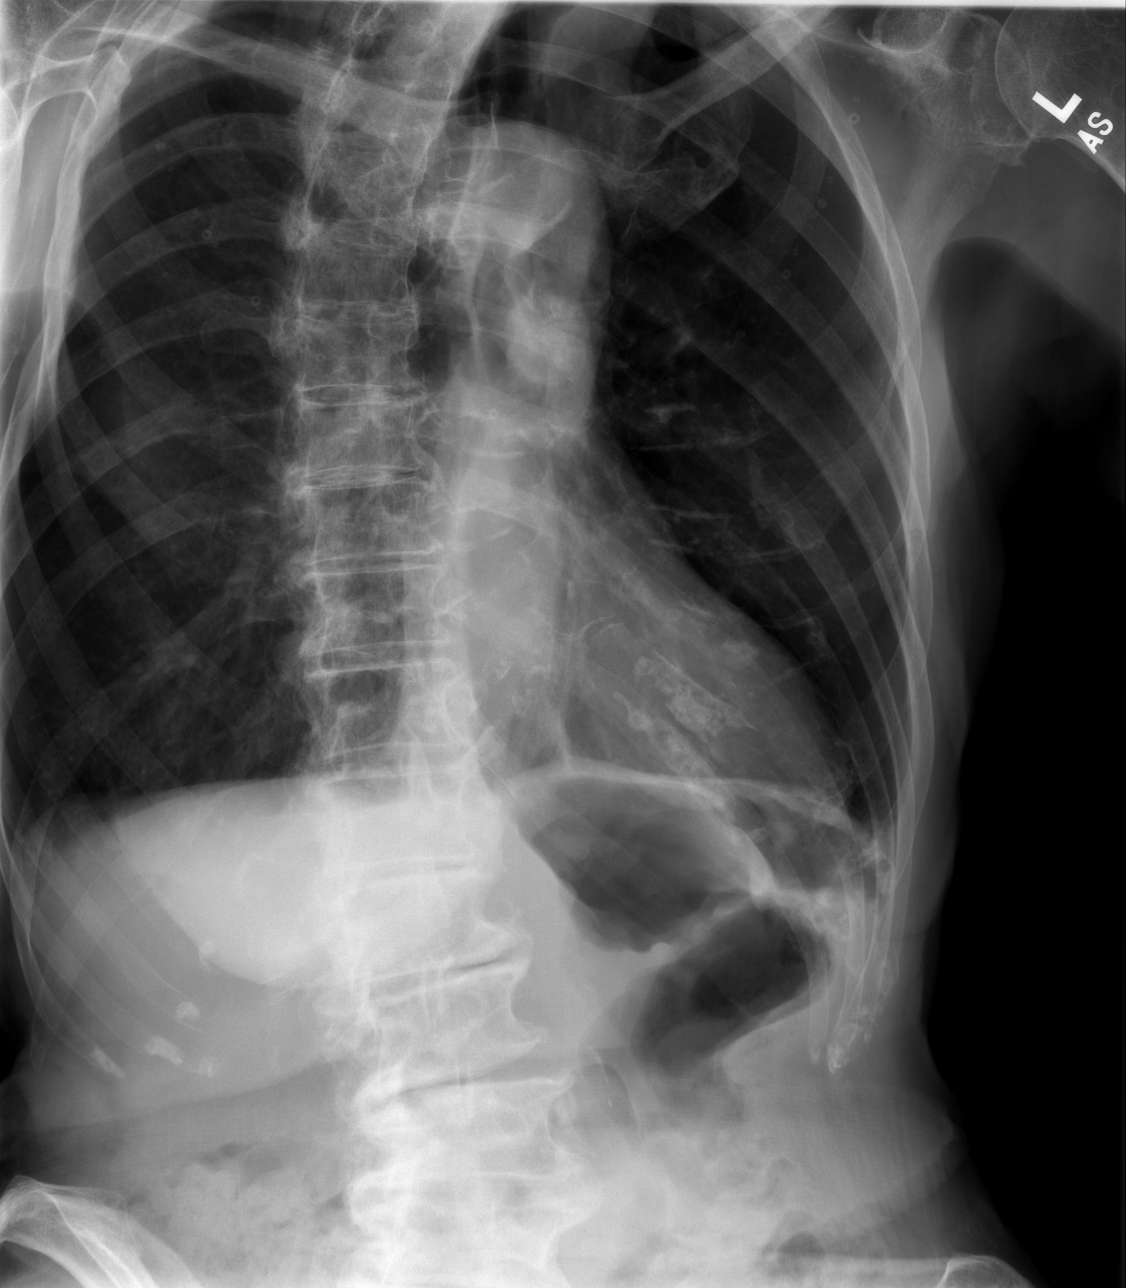

[3 of 3 positions shown; findings below may reference images not displayed]

FINDINGS: The lungs remain hyperinflated. There is no focal infiltrate. There
is no pleural effusion. The heart and pulmonary vascularity are
normal. There is stable tortuosity of the descending thoracic aorta.
There are old fractures of the posterior lateral aspects of the
right fifth and sixth ribs. The left breast shadow is absent. Left
rib detail views reveal no lytic or blastic lesions nor acute or
healing fractures. There is mild multilevel degenerative disc
disease of the thoracic spine with moderate degenerative changes of
the lumbar spine.
IMPRESSION: 1. COPD without evidence of acute cardiopulmonary abnormality.
2. No lytic or blastic left rib fracture is observed. There are
degenerative changes of the thoracic spine an old right rib
fractures.
3. If the patient's symptoms persist and remain unexplained, chest
CT scanning would be a useful next imaging step.

## 2017-08-27 ENCOUNTER — Telehealth: Payer: Self-pay | Admitting: *Deleted

## 2017-08-27 NOTE — Telephone Encounter (Signed)
Pain is a frequent reason for visits and has been stable (8 visits in last 3 months). Pt has appt with me 10/17. Please instruct pt to keep appt and follow up then.

## 2017-08-27 NOTE — Telephone Encounter (Signed)
Patient left message on nurse line requesting to speak with PCP regarding her kidney stone and pain. Please give her a call; number on file is correct. Derl Barrow, RN

## 2017-09-02 ENCOUNTER — Ambulatory Visit (INDEPENDENT_AMBULATORY_CARE_PROVIDER_SITE_OTHER): Payer: Medicare Other | Admitting: Family Medicine

## 2017-09-02 VITALS — BP 110/60 | HR 82 | Temp 98.7°F | Wt 121.0 lb

## 2017-09-02 DIAGNOSIS — R300 Dysuria: Secondary | ICD-10-CM | POA: Diagnosis not present

## 2017-09-02 DIAGNOSIS — R109 Unspecified abdominal pain: Secondary | ICD-10-CM

## 2017-09-02 LAB — POCT URINALYSIS DIP (MANUAL ENTRY)
Bilirubin, UA: NEGATIVE
Glucose, UA: NEGATIVE mg/dL
Ketones, POC UA: NEGATIVE mg/dL
LEUKOCYTES UA: NEGATIVE
NITRITE UA: NEGATIVE
PH UA: 7 (ref 5.0–8.0)
Protein Ur, POC: NEGATIVE mg/dL
Spec Grav, UA: 1.015 (ref 1.010–1.025)
UROBILINOGEN UA: 0.2 U/dL

## 2017-09-02 LAB — POCT UA - MICROSCOPIC ONLY

## 2017-09-02 NOTE — Patient Instructions (Signed)
Thank you for coming in to see Heather [name] today. Please see below to review our plan for today's visit.  1. Take the Tylenol No. 3 for your pain as instructed. This medication is safe for you to use at this time. 2. I would like you to be evaluated with our geriatric clinic. 3. Return to the clinic in 2 months for reevaluation of your abdominal pain.  Please call the clinic at [phone] if your symptoms worsen or you have any concerns. It was my pleasure to see you. -- [name], [place], PGY-2

## 2017-09-02 NOTE — Progress Notes (Signed)
   Subjective:   Patient ID: Heather [name]    DOB: [birth year], 83 y.o. female   MRN: [id number]  CC: "Abdominal pain"  HPI: Heather [name] is a 83 y.o. female who presents to clinic today for the following:  Abdominal pain: patient says pain has been getting worse since last visit but patient cannot specify how. Pain is located at the LLQ and constant, worse with movement. Radiates around waste to left back. She states she continues to have bowel movements daily without difficulty. She is concerned today because she feels like she's urinating more than normal.she went to fill her Tylenol No. 2 prescription given to her by me during her last visit but was given Tylenol No. 3 by the pharmacy. Patient did not take the medication because she has never tried the No. 3 before.  ROS: denies fevers or chills, nausea or vomiting, chest pain, shortness of breath, diarrhea or constipation, melena or hematochezia.  Complete ROS performed, see HPI for pertinent.  [place]: HTN, HFpEF (EF 55-65%, 2010), history ofbreast cancer (1994, s/p L-mastectomy), renal cyst. Family history unremarkable. Smoking status reviewed. Medications reviewed.  Objective:   BP 110/60 (Cuff Size: Small)   Pulse 82   Temp 98.7 F (37.1 C) (Oral)   Wt 121 lb (54.9 kg)   SpO2 95%   BMI 22.86 kg/m  Vitals and nursing note reviewed.  General: frail elderly woman in wheelchair, in no acute distress with non-toxic appearance HEENT: normocephalic, atraumatic, moist mucous membranes Neck: supple, non-tender without lymphadenopathy CV: regular rate and rhythm without murmurs, rubs, or gallops, no lower extremity edema Lungs: clear to auscultation bilaterally with normal work of breathing Abdomen: soft, mild tenderness left lower quadrant particularly under the skin without rebound or guarding, non-distended, no masses or organomegaly palpable, normoactive bowel sounds Skin: warm, dry, no rashes or lesions, cap refill < 2  seconds Extremities: warm and well perfused, normal tone  Assessment & Plan:   Functional abdominal pain syndrome Chronic. Particularly problematic at left lower quadrant. Has undergone extensive workup including GI, pulmonology, and orthopedics without any findings to support her symptoms. Was given pain medication during last visit but did not take as instructed. No red flags. UA negative. --Reassured patient Tylenol No. 3 is appropriate and safe for her at this time --Patient referred for geriatric clinic evaluation --RTC in 2 months  Orders Placed This Encounter  Procedures  . POCT urinalysis dipstick  . POCT UA - Microscopic Only   No orders of the defined types were placed in this encounter.   [name], [place], PGY-2 09/02/2017 2:28 PM

## 2017-09-02 NOTE — Assessment & Plan Note (Addendum)
Chronic. Particularly problematic at left lower quadrant. Has undergone extensive workup including GI, pulmonology, and orthopedics without any findings to support her symptoms. Was given pain medication during last visit but did not take as instructed. No red flags. UA negative. --Reassured patient Tylenol No. 3 is appropriate and safe for her at this time --Patient referred for geriatric clinic evaluation --RTC in 2 months

## 2017-09-03 ENCOUNTER — Other Ambulatory Visit: Payer: Self-pay | Admitting: Family Medicine

## 2017-09-03 ENCOUNTER — Telehealth: Payer: Self-pay | Admitting: Family Medicine

## 2017-09-03 DIAGNOSIS — K2100 Gastro-esophageal reflux disease with esophagitis, without bleeding: Secondary | ICD-10-CM

## 2017-09-03 DIAGNOSIS — I10 Essential (primary) hypertension: Secondary | ICD-10-CM

## 2017-09-03 MED ORDER — CARVEDILOL 3.125 MG PO TABS: 3.1250 mg | ORAL_TABLET | Freq: Two times a day (BID) | ORAL | 3 refills | Status: DC

## 2017-09-03 MED ORDER — PANTOPRAZOLE SODIUM 40 MG PO TBEC: 40.0000 mg | DELAYED_RELEASE_TABLET | Freq: Two times a day (BID) | ORAL | 3 refills | Status: DC

## 2017-09-03 NOTE — Telephone Encounter (Signed)
Rx's placed. Should be available for pick up.

## 2017-09-03 NOTE — Telephone Encounter (Signed)
Pt is calling because she needs refills on her Protonix and Coreg. She said that she asked the doctor yesterday to call these in , but the pharmacy hasn't received them yet. She does not have enough medication for the weekend so can we call this in today. jw

## 2017-09-07 ENCOUNTER — Telehealth: Payer: Self-pay | Admitting: Family Medicine

## 2017-09-07 NOTE — Telephone Encounter (Signed)
Pt was told at her last visit she had a kidney stone. She wants to know what to do next.  She said she never got the results back from oct 17.  She says the pain is unbearable.  She wants to know something today otherwise she is going to the hospital.  It was a confusing conversation because she started by asking for an appt with dr Kennon Rounds.  [employer] had told her to call and ask for dr pratt.  Please call her back

## 2017-09-09 ENCOUNTER — Encounter (HOSPITAL_COMMUNITY): Payer: Self-pay | Admitting: Family Medicine

## 2017-09-09 ENCOUNTER — Emergency Department (HOSPITAL_COMMUNITY): Payer: Medicare Other

## 2017-09-09 ENCOUNTER — Emergency Department (HOSPITAL_COMMUNITY)
Admission: EM | Admit: 2017-09-09 | Discharge: 2017-09-09 | Disposition: A | Discharge status: Home / Self Care | Payer: Medicare Other | Attending: Physician Assistant | Admitting: Physician Assistant

## 2017-09-09 DIAGNOSIS — F0390 Unspecified dementia without behavioral disturbance: Secondary | ICD-10-CM | POA: Diagnosis not present

## 2017-09-09 DIAGNOSIS — I5030 Unspecified diastolic (congestive) heart failure: Secondary | ICD-10-CM | POA: Diagnosis not present

## 2017-09-09 DIAGNOSIS — Z79899 Other long term (current) drug therapy: Secondary | ICD-10-CM | POA: Diagnosis not present

## 2017-09-09 DIAGNOSIS — I110 Hypertensive heart disease with heart failure: Secondary | ICD-10-CM | POA: Insufficient documentation

## 2017-09-09 DIAGNOSIS — R1032 Left lower quadrant pain: Secondary | ICD-10-CM | POA: Insufficient documentation

## 2017-09-09 DIAGNOSIS — R109 Unspecified abdominal pain: Secondary | ICD-10-CM | POA: Diagnosis not present

## 2017-09-09 DIAGNOSIS — N200 Calculus of kidney: Secondary | ICD-10-CM | POA: Diagnosis not present

## 2017-09-09 LAB — COMPREHENSIVE METABOLIC PANEL
ALBUMIN: 3.9 g/dL (ref 3.5–5.0)
ALT: 20 U/L (ref 14–54)
AST: 29 U/L (ref 15–41)
Alkaline Phosphatase: 34 U/L — ABNORMAL LOW (ref 38–126)
Anion gap: 9 (ref 5–15)
BUN: 18 mg/dL (ref 6–20)
CHLORIDE: 104 mmol/L (ref 101–111)
CO2: 27 mmol/L (ref 22–32)
Calcium: 9.3 mg/dL (ref 8.9–10.3)
Creatinine, Ser: 0.91 mg/dL (ref 0.44–1.00)
GFR calc Af Amer: 59 mL/min — ABNORMAL LOW (ref >60)
GFR calc non Af Amer: 51 mL/min — ABNORMAL LOW (ref >60)
GLUCOSE: 92 mg/dL (ref 65–99)
POTASSIUM: 3.9 mmol/L (ref 3.5–5.1)
SODIUM: 140 mmol/L (ref 135–145)
Total Bilirubin: 1 mg/dL (ref 0.3–1.2)
Total Protein: 6.7 g/dL (ref 6.5–8.1)

## 2017-09-09 LAB — URINALYSIS, ROUTINE W REFLEX MICROSCOPIC
BACTERIA UA: NONE SEEN
Bilirubin Urine: NEGATIVE
Glucose, UA: NEGATIVE mg/dL
KETONES UR: NEGATIVE mg/dL
Nitrite: NEGATIVE
PROTEIN: NEGATIVE mg/dL
SQUAMOUS EPITHELIAL / LPF: NONE SEEN
Specific Gravity, Urine: 1.008 (ref 1.005–1.030)
pH: 6 (ref 5.0–8.0)

## 2017-09-09 LAB — CBC WITH DIFFERENTIAL/PLATELET
BASOS PCT: 0 %
Basophils Absolute: 0 10*3/uL (ref 0.0–0.1)
EOS ABS: 0.1 10*3/uL (ref 0.0–0.7)
EOS PCT: 1 %
HCT: 38.7 % (ref 36.0–46.0)
Hemoglobin: 12.7 g/dL (ref 12.0–15.0)
LYMPHS ABS: 2.1 10*3/uL (ref 0.7–4.0)
Lymphocytes Relative: 37 %
MCH: 30.8 pg (ref 26.0–34.0)
MCHC: 32.8 g/dL (ref 30.0–36.0)
MCV: 93.7 fL (ref 78.0–100.0)
MONO ABS: 0.7 10*3/uL (ref 0.1–1.0)
MONOS PCT: 13 %
NEUTROS PCT: 49 %
Neutro Abs: 2.8 10*3/uL (ref 1.7–7.7)
PLATELETS: 201 10*3/uL (ref 150–400)
RBC: 4.13 MIL/uL (ref 3.87–5.11)
RDW: 13.7 % (ref 11.5–15.5)
WBC: 5.6 10*3/uL (ref 4.0–10.5)

## 2017-09-09 NOTE — Discharge Instructions (Signed)
You do have a stone in your left kidney.  However we doubt that it is causing much discomfort for you right now given its placement.  Please be sure you are drinking plenty of fluids and resting.  Please return with any concerning symptoms.

## 2017-09-09 NOTE — ED Triage Notes (Signed)
Patient is from home and brought to emergency department by son. Patient is complaining of left flank pain for the last 2-3 months ago. Patient reports the pain eases off when sitting down and gets worse with movement while on her feet. Patient reports she has started wearing a depends for the last month. Also, reports she was informed by PCP before 10/17 that she had a kidney stone. Had to stop triage to assist patient to the restroom. Patient is constantly talking while triaging. Patient is calm, oriented x 4.

## 2017-09-09 NOTE — ED Provider Notes (Signed)
[facility] DEPT Provider Note   CSN: [id number] Arrival date & time: 09/09/17  1543     History   Chief Complaint Chief Complaint  Patient presents with  . Flank Pain    HPI Heather [name] is a 83 y.o. female.  HPI   Is a 83 year old female with past medical history significant for diverticulitis, gastritis, low back pain, and chronic left flank/lower quadrant/left hip pain.  Patient's been seen by multiple providers for this.  According to last primary care note she is seeing orthopedics, GI, surgery.  Patient is presenting with the same complaints today.  She reports occasional left lower quadrant/hip pain.  Is worse with standing, better when sitting down.  She had no nausea no vomiting no diarrhea.  Has been eating normally.  Patient is very talkative and has mild flight of ideas but is alert and oriented x3.  She did mention worried being worried that someone "could have given her poison".  However when explored she not appear to have any other delusions.  Unfortunately patient is prepped family is not present at this time.    Past Medical History:  Diagnosis Date  . Allergic rhinitis, cause unspecified 02/21/2014  . Anxiety state, unspecified   . Breast cancer ([place])    NO STICK OR BLOOD PRESSURE CHECKS IN LEFT ARM  . Carpal tunnel syndrome   . Cervical spondylosis   . Colon polyp 12/19/2009   Transverse-polypoid colorectal mucosa  . Constipation   . Degenerative joint disease   . Diastolic dysfunction   . Diverticula of colon   . Gastritis, chronic   . Hiatal hernia   . Hypertension   . Low back pain   . RBBB (right bundle branch block with left anterior fascicular block)   . Renal cyst   . Venous insufficiency     Patient Active Problem List   Diagnosis Date Noted  . Functional abdominal pain syndrome 09/02/2017  . Encounter for chronic pain management 07/22/2017  . Dementia 06/25/2017  . Vitamin D deficiency 05/21/2017    . Abnormal CT scan of lung 07/16/2015  . Esophageal dysmotility 10/27/2014  . Failure to thrive in adult 04/26/2014  . Arthritis of hand, degenerative 04/10/2014  . Other and unspecified hyperlipidemia 04/10/2014  . LPRD (laryngopharyngeal reflux disease) 04/04/2014  . Chronic bronchitis ([place]) 05/30/2013  . Hx of breast cancer 08/30/2012  . Edema 06/12/2011  . GERD 03/27/2010  . Diastolic CHF ([place]) 03/50/0938  . Constipation 01/18/2009  . Carpal tunnel syndrome on both sides 10/03/2008  . Cervical spondylosis without myelopathy 03/21/2008  . Diaphragmatic hernia 12/26/2007  . Renal cyst 12/26/2007  . Primary hypertension 09/15/2007  . Venous (peripheral) insufficiency 09/15/2007  . Osteoarthritis 09/15/2007  . Lumbago 09/15/2007  . Diverticulosis of colon 05/24/2002    Past Surgical History:  Procedure Laterality Date  . CARPAL TUNNEL RELEASE    . Left mastectomy      OB History    No data available       Home Medications    Prior to Admission medications   Medication Sig Start Date End Date Taking? Authorizing Provider  acetaminophen-codeine (TYLENOL #2) 300-15 MG tablet Take 1 tablet by mouth every 8 (eight) hours as needed for moderate pain. 08/20/17  Yes [place] Bing, DO  ALPRAZolam Duanne Moron) 0.25 MG tablet Take 1 tablet (0.25 mg total) by mouth daily as needed for anxiety. Do not put in child proof bottles as patient has severe hand arthritis 07/29/17  Yes Dickie La, MD  carvedilol (COREG) 3.125 MG tablet Take 1 tablet (3.125 mg total) by mouth 2 (two) times daily. 09/03/17  Yes [place] Bing, DO  Cholecalciferol (VITAMIN D3) 1000 units CAPS Take 1 capsule (1,000 Units total) by mouth daily. For your bones 05/21/17  Yes Donnamae Jude, MD  furosemide (LASIX) 20 MG tablet Take 1 tablet (20 mg total) by mouth daily. 05/21/17  Yes Donnamae Jude, MD  levocetirizine (XYZAL) 5 MG tablet Take 1 tablet (5 mg total) by mouth every evening. 05/21/17  Yes Donnamae Jude, MD   Multiple Vitamins-Minerals (MULTIVITAMINS THER. W/MINERALS) TABS Take 1 tablet by mouth daily.   Yes [provider]  pantoprazole (PROTONIX) 40 MG tablet Take 1 tablet (40 mg total) by mouth 2 (two) times daily. 09/03/17  Yes [place] Bing, DO  feeding supplement, ENSURE ENLIVE, (ENSURE ENLIVE) LIQD Take 237 mLs by mouth 2 (two) times daily between meals. Patient not taking: Reported on 09/09/2017 09/30/16   Nche, Charlene Brooke, NP  hydroxypropyl methylcellulose / hypromellose (ISOPTO TEARS / GONIOVISC) 2.5 % ophthalmic solution Place 1 drop into both eyes 3 (three) times daily as needed for dry eyes.    [provider]  polyethylene glycol powder (GLYCOLAX/MIRALAX) powder Take 17 g by mouth daily as needed. Patient taking differently: Take 17 g by mouth daily as needed for moderate constipation.  05/21/17   Donnamae Jude, MD  spironolactone (ALDACTONE) 25 MG tablet Take 1 tablet (25 mg total) by mouth daily. Patient not taking: Reported on 09/09/2017 05/21/17   Donnamae Jude, MD    Family History Family History  Problem Relation Age of Onset  . Colon cancer Neg Hx     Social History Social History  Substance Use Topics  . Smoking status: Never Smoker  . Smokeless tobacco: Never Used  . Alcohol use No     Allergies   Fentanyl   Review of Systems Review of Systems  Constitutional: Negative for activity change, fatigue and fever.  HENT: Negative for congestion.   Respiratory: Negative for shortness of breath.   Cardiovascular: Negative for chest pain.  Gastrointestinal: Positive for abdominal pain. Negative for constipation, diarrhea, nausea and vomiting.  Genitourinary: Positive for flank pain. Negative for difficulty urinating, dysuria and frequency.  Musculoskeletal: Negative for arthralgias.  Neurological: Negative for dizziness.  Psychiatric/Behavioral: Negative for dysphoric mood, hallucinations and suicidal ideas. The patient is not nervous/anxious.    All other systems reviewed and are negative.    Physical Exam Updated Vital Signs BP 138/69 (BP Location: Right Arm)   Pulse 67   Temp 97.9 F (36.6 C) (Oral)   Resp 18   Ht 5\' 4"  (1.626 m)   Wt 48.2 kg (106 lb 4.8 oz)   SpO2 100%   BMI 18.25 kg/m   Physical Exam  Constitutional: She is oriented to person, place, and time. She appears well-developed and well-nourished.  HENT:  Head: Normocephalic and atraumatic.  Eyes: Right eye exhibits no discharge. Left eye exhibits no discharge.  Cardiovascular: Normal rate.   Pulmonary/Chest: Effort normal and breath sounds normal. No respiratory distress. She has no wheezes.  Abdominal: Soft. She exhibits no distension. There is no tenderness.  Neurological: She is oriented to person, place, and time. No cranial nerve deficit.  Skin: Skin is warm and dry. She is not diaphoretic.  Psychiatric: She has a normal mood and affect.  Nursing note and vitals reviewed.    ED Treatments /  Results  Labs (all labs ordered are listed, but only abnormal results are displayed) Labs Reviewed  URINALYSIS, ROUTINE W REFLEX MICROSCOPIC - Abnormal; Notable for the following:       Result Value   Color, Urine STRAW (*)    Hgb urine dipstick SMALL (*)    Leukocytes, UA TRACE (*)    All other components within normal limits  COMPREHENSIVE METABOLIC PANEL - Abnormal; Notable for the following:    Alkaline Phosphatase 34 (*)    GFR calc non Af Amer 51 (*)    GFR calc Af Amer 59 (*)    All other components within normal limits  CBC WITH DIFFERENTIAL/PLATELET    EKG  EKG Interpretation None       Radiology Ct Renal Stone Study  Result Date: 09/09/2017 CLINICAL DATA:  Recurrent LEFT flank and hip pain. Assess for stone disease. History of breast cancer, gastritis, diverticulitis, constipation. EXAM: CT ABDOMEN AND PELVIS WITHOUT CONTRAST TECHNIQUE: Multidetector CT imaging of the abdomen and pelvis was performed following the standard  protocol without IV contrast. COMPARISON:  CT abdomen and pelvis June 23, 2017 FINDINGS: LOWER CHEST: 4 mm granuloma RIGHT middle lobe. Scarring LEFT lower lobe. Heart size is normal. Mild coronary artery calcifications. No pericardial effusion. HEPATOBILIARY: Punctate hepatic calcified granuloma, liver is otherwise unremarkable by noncontrast CT. Normal gallbladder. PANCREAS: Normal. SPLEEN: Normal. ADRENALS/URINARY TRACT: Kidneys are orthotopic, demonstrating normal size and morphology. 2 mm LEFT lower pole nephrolithiasis. No hydronephrosis; limited assessment for renal masses on this nonenhanced examination. Homogeneously hypodense 2.2 cm simple cyst upper pole RIGHT kidney. The unopacified ureters are normal in course and caliber. Urinary bladder is well distended and unremarkable. No adrenal mass, evaluation limited by paucity of intraperitoneal fat. STOMACH/BOWEL: The stomach, small and large bowel are normal in course and caliber without inflammatory changes, sensitivity decreased by lack of enteric contrast. Scattered colonic diverticula. Moderate amount of retained large bowel stool. Small amount of small bowel feces compatible chronic stasis. Normal appendix. VASCULAR/LYMPHATIC: Aortoiliac vessels are normal in course and caliber. Moderate calcific atherosclerosis. No definite lymphadenopathy by CT size criteria. REPRODUCTIVE: Nonacute. OTHER: No intraperitoneal free fluid or free air. MUSCULOSKELETAL: Non-acute. Severe sacroiliac osteoarthrosis. Moderate bilateral sacroiliac osteoarthrosis. Broad lumbar mild levoscoliosis. Old L3 inferior endplate Schmorl's node. Mild chronic L5 compression fracture. Severe degenerative change of the lumbar spine. No advanced degenerative change of the hips. Osteopenia. IMPRESSION: 1. 2 mm nonobstructing LEFT nephrolithiasis. 2. Moderate retained large bowel stool, no bowel obstruction. 3. Stable degenerative change of lumbar spine. Old mild L5 compression fracture.  Osteopenia. Aortic Atherosclerosis (ICD10-I70.0). Electronically Signed   By: Elon Alas M.D.   On: 09/09/2017 22:20    Procedures Procedures (including critical care time)  Medications Ordered in ED Medications - No data to display   Initial Impression / Assessment and Plan / ED Course  I have reviewed the triage vital signs and the nursing notes.  Pertinent labs & imaging results that were available during my care of the patient were reviewed by me and considered in my medical decision making (see chart for details).      Is a 83 year old female with past medical history significant for diverticulitis, gastritis, low back pain, and chronic left flank/lower quadrant/left hip pain.  Patient's been seen by multiple providers for this.  According to last primary care note she is seeing orthopedics, GI, surgery.  Patient is presenting with the same complaints today.  She reports occasional left lower quadrant/hip pain.  Is worse with  standing, better when sitting down.  She had no nausea no vomiting no diarrhea.  Has been eating normally.  Patient is very talkative and has mild flight of ideas but is alert and oriented x3.  She did mention worried being worried that someone "could have given her poison".  However when explored she not appear to have any other delusions.  Unfortunately patient is prepped family is not present at this time.  7:14 PM Even this been going on at least over a year (according to visit notes that she has been here for the same things) and will think a major workup is necessary at this point will do baseline lab work and get a urine.  We will make sure patient has a safe dispo home with son.  Patient's  abdominal exam is benign and has normal vital signs.  10:25 PM Patient was totally comfortable when I first saw her.  Now that her son is here patient was writing and saying how much her left lower quadrant/left hip hurt.  However if you asked her couple  questions about her history and her life and her family patient will immediately stop any kind of grimace or pain and discussed willingly for with 5-10 minutes.  Decision made to get the CT given her age.  CT showed small nonobstructing left stone which is what other physicians have told her and what she thinks is causing pain.  I do not see any thing requiring inpatient admission at this time.    Of note patient's first blood pressure was taken with a very large cuff for her size  Final Clinical Impressions(s) / ED Diagnoses   Final diagnoses:  None    New Prescriptions New Prescriptions   No medications on file     Macarthur Critchley, MD 09/09/17 2226

## 2017-09-10 DIAGNOSIS — N3281 Overactive bladder: Secondary | ICD-10-CM | POA: Diagnosis not present

## 2017-09-15 NOTE — Progress Notes (Signed)
   Subjective   Patient ID: Heather [name]    DOB: 1918-11-10, 83 y.o. female   MRN: [id number]  CC: "Abdominal pain"  HPI: Heather [name] is a 83 y.o. female who presents to clinic today for the following:  Abdominal pain: Patient presenting today after being seen in the ED 10/24 for left flank pain.  CT showed nonobstructing left renal stone which patient states she feels is the source of the pain.  Patient was subsequently seen by urology as of last week who states there is nothing they can do and this is unlikely the source of her pain.  Patient states she has only taken the Tylenol No. 3 for a few days but is stopped because she feels like she needs Tylenol No. 2.  ROS: see HPI for pertinent.  [place]: HTN, HFpEF (EF 55-65%, 2010), history ofbreast cancer (1994, s/p L-mastectomy), renal cyst. Family history unremarkable. Smoking status reviewed. Medications reviewed.  Objective   Pulse 85   Temp 99.2 F (37.3 C) (Oral)   Ht 5\' 4"  (1.626 m)   Wt 122 lb (55.3 kg)   BMI 20.94 kg/m  Vitals and nursing note reviewed.  General: frail elderly woman with walker, in no acute distress with non-toxic appearance HEENT: normocephalic, atraumatic, moist mucous membranes Neck: supple, non-tender without lymphadenopathy CV: regular rate and rhythm without murmurs, rubs, or gallops, no lower extremity edema Lungs: clear to auscultation bilaterally with normal work of breathing Abdomen: soft, non-tender, non-distended, no masses or organomegaly palpable, normoactive bowel sounds Skin: warm, dry, no rashes or lesions, cap refill < 2 seconds Extremities: warm and well perfused, normal tone  Assessment & Plan   Functional abdominal pain syndrome Chronic.  Uncertain etiology.  Has nonobstructing left renal stone without signs of complications.  Patient adamant about specific pain medication requesting Tylenol No. 2.  Given patient stated age and extensive workup, see benefits outweigh the  risks. - Given prescription for Tylenol No. 2, 40 tabs - RTC 2 months, expressed importance of avoiding UC and ED visits for unchanged abdominal pain and requested patient follow-up at our clinic instead, patient and son in agreement  No orders of the defined types were placed in this encounter.  Meds ordered this encounter  Medications  . acetaminophen-codeine (TYLENOL #2) 300-15 MG tablet    Sig: Take 1 tablet by mouth every 8 (eight) hours.    Dispense:  40 tablet    Refill:  0    [name], DO [place], PGY-2 09/16/2017 4:55 PM

## 2017-09-16 ENCOUNTER — Encounter: Payer: Self-pay | Admitting: Family Medicine

## 2017-09-16 ENCOUNTER — Ambulatory Visit (INDEPENDENT_AMBULATORY_CARE_PROVIDER_SITE_OTHER): Payer: Medicare Other | Admitting: Family Medicine

## 2017-09-16 DIAGNOSIS — G8929 Other chronic pain: Secondary | ICD-10-CM

## 2017-09-16 DIAGNOSIS — R109 Unspecified abdominal pain: Secondary | ICD-10-CM | POA: Diagnosis not present

## 2017-09-16 MED ORDER — ACETAMINOPHEN-CODEINE #2 300-15 MG PO TABS: 1.0000 | ORAL_TABLET | Freq: Three times a day (TID) | ORAL | 0 refills | Status: DC

## 2017-09-16 NOTE — Assessment & Plan Note (Signed)
Chronic.  Uncertain etiology.  Has nonobstructing left renal stone without signs of complications.  Patient adamant about specific pain medication requesting Tylenol No. 2.  Given patient stated age and extensive workup, see benefits outweigh the risks. - Given prescription for Tylenol No. 2, 40 tabs - RTC 2 months, expressed importance of avoiding UC and ED visits for unchanged abdominal pain and requested patient follow-up at our clinic instead, patient and son in agreement

## 2017-09-16 NOTE — Patient Instructions (Signed)
Thank you for coming in to see [place] today. Please see below to review our plan for today's visit.  Take the Tylenol #2 as prescribed.  Please call the clinic at [phone] if your symptoms worsen or you have any concerns. It was my pleasure to see you. -- [name], [place], PGY-2

## 2017-09-24 ENCOUNTER — Telehealth: Payer: Self-pay | Admitting: Family Medicine

## 2017-09-24 NOTE — Telephone Encounter (Signed)
Pt is still complaining of her abdominal pain.  She is having lots of urine come out and it is always clear.  She is just making too much water.  She thinks she may have poison in her stomach.  Can you send her somewhere to see whats going on with her body?

## 2017-09-28 NOTE — Telephone Encounter (Signed)
I am very familiar with Heather [name] is abdominal pain.  Please have her follow-up with her next scheduled appointment.  If she would like to be seen sooner, have her schedule appointment at our clinic.  Thank you.

## 2017-09-30 NOTE — Telephone Encounter (Signed)
Pt informed and scheduled with dr Shan Levans on Friday. Per stated she wanted to see a female dr. Delray Alt, CMA

## 2017-10-02 ENCOUNTER — Other Ambulatory Visit: Payer: Self-pay

## 2017-10-02 ENCOUNTER — Ambulatory Visit: Payer: Medicare Other | Admitting: Family Medicine

## 2017-10-02 ENCOUNTER — Encounter: Payer: Self-pay | Admitting: Family Medicine

## 2017-10-02 VITALS — BP 120/62 | HR 80 | Temp 98.3°F | Ht 64.0 in | Wt 121.0 lb

## 2017-10-02 DIAGNOSIS — M1612 Unilateral primary osteoarthritis, left hip: Secondary | ICD-10-CM | POA: Diagnosis not present

## 2017-10-02 MED ORDER — NAPROXEN 500 MG PO TABS: 500.0000 mg | ORAL_TABLET | Freq: Three times a day (TID) | ORAL | 0 refills | Status: DC | PRN

## 2017-10-02 NOTE — Assessment & Plan Note (Signed)
Association with movement, lack of GI or GU likely causes, and patient's age increase the likelihood that her pain is due to hip arthritis.  We will prescribe Naproxen PRN with meals and have patient continue Tylenol PRN.  Patient's kidney function is good and Naproxen is more effective for musculoskeletal pain than Tylenol, so we will see how she does with this.  If she wishes to get a refill of Tylenol w/ codeine, her PCP will need to be contacted.  She may benefit from a steroid hip injection in the future if she does not improve with NSAID use.  I would like for her to be seen in a few weeks for follow up of this pain.

## 2017-10-02 NOTE — Progress Notes (Signed)
Subjective:    Heather [name] - 83 y.o. female MRN [id number]  Date of birth: Nov 26, 1917  HPI  Heather [name] is here for chronic left sided abdominal and flank pain.  Left-sided pain This pain has been going on for 8-9 months and is worse when patient stands up or walks.  She does not wake up due to this pain, as it does not hurt when she is lying down.  She has had an extensive workup which has been negative so far for GI or GU causes to her pain.  She has been taking Tylenol with Codeine, which does not seem to help her pain.  She says that her pain has been worsening since earlier this year.  She is also worried that she is losing weight.  She says she eats a lot every day, has 1-2 soft bowel movements daily, and frequently soaks her Depends underwear during the day and night.  She denies dysuria.  She denies blood in her stool.  She would also like for me to become her PCP since she wants a female physician.  Lives by herself, but son lives across the street and accompanies her to her appointments.   Health Maintenance:  - declines flu shot due to being in pain today Health Maintenance Due  Topic Date Due  . TETANUS/TDAP  06/13/1939  . PNA vac Low Risk Adult (1 of 2 - PCV13) 06/12/1985    -  reports that  has never smoked. she has never used smokeless tobacco. - Review of Systems: Per HPI. - Past Medical History: Patient Active Problem List   Diagnosis Date Noted  . Functional abdominal pain syndrome 09/02/2017  . Encounter for chronic pain management 07/22/2017  . Dementia 06/25/2017  . Vitamin D deficiency 05/21/2017  . Abnormal CT scan of lung 07/16/2015  . Esophageal dysmotility 10/27/2014  . Failure to thrive in adult 04/26/2014  . Arthritis of hand, degenerative 04/10/2014  . Other and unspecified hyperlipidemia 04/10/2014  . LPRD (laryngopharyngeal reflux disease) 04/04/2014  . Chronic bronchitis ([place]) 05/30/2013  . Hx of breast cancer 08/30/2012  . Edema  06/12/2011  . GERD 03/27/2010  . Diastolic CHF ([place]) 78/24/2353  . Constipation 01/18/2009  . Carpal tunnel syndrome on both sides 10/03/2008  . Cervical spondylosis without myelopathy 03/21/2008  . Diaphragmatic hernia 12/26/2007  . Renal cyst 12/26/2007  . Primary hypertension 09/15/2007  . Venous (peripheral) insufficiency 09/15/2007  . Osteoarthritis 09/15/2007  . Lumbago 09/15/2007  . Diverticulosis of colon 05/24/2002   - Medications: reviewed and updated   Objective:   Physical Exam BP 120/62   Pulse 80   Temp 98.3 F (36.8 C) (Oral)   Ht 5\' 4"  (1.626 m)   Wt 121 lb (54.9 kg)   SpO2 98%   BMI 20.77 kg/m  Gen: NAD, alert, cooperative with exam, well-appearing HEENT: NCAT, clear conjunctiva CV: RRR, good S1/S2, no murmur, no edema  Resp: CTABL, no wheezes, non-labored Abd: SNTND, BS present, no guarding or organomegaly, patient states that she is tender to palpation in lower abdomen Musculoskeletal: patient states that she is tender to palpation along hip joint.  Stable gait with walker. Skin: no rashes, normal turgor  Neuro: no gross deficits.  Psych: appears anxious and repeatedly asks questions previously answered        Assessment & Plan:   Osteoarthritis Association with movement, lack of GI or GU likely causes, and patient's age increase the likelihood that her pain is due to  hip arthritis.  We will prescribe Naproxen PRN with meals and have patient continue Tylenol PRN.  Patient's kidney function is good and Naproxen is more effective for musculoskeletal pain than Tylenol, so we will see how she does with this.  If she wishes to get a refill of Tylenol w/ codeine, her PCP will need to be contacted.  She may benefit from a steroid hip injection in the future if she does not improve with NSAID use.  I would like for her to be seen in a few weeks for follow up of this pain.    Maia Breslow, M.D. 10/02/2017, 4:50 PM PGY-1, [place]

## 2017-10-02 NOTE — Patient Instructions (Addendum)
It was nice meeting you today Heather [name]!  Your pain is likely related to left hip arthritis.  I would like for you to try a medication called Naproxen (also known as Aleve) for this pain.  Please take this medication with meals and only as needed, up to three times daily.  Please come back for a follow up appointment in 2-3 weeks.  If you have any questions or concerns, please feel free to call the clinic.   Be well,  Dr. Shan Levans

## 2017-10-15 DIAGNOSIS — N3281 Overactive bladder: Secondary | ICD-10-CM | POA: Diagnosis not present

## 2017-10-15 DIAGNOSIS — R351 Nocturia: Secondary | ICD-10-CM | POA: Diagnosis not present

## 2017-10-20 ENCOUNTER — Other Ambulatory Visit: Payer: Self-pay

## 2017-10-20 ENCOUNTER — Encounter: Payer: Self-pay | Admitting: Family Medicine

## 2017-10-20 ENCOUNTER — Ambulatory Visit: Payer: Medicare Other | Admitting: Family Medicine

## 2017-10-20 VITALS — BP 112/60 | HR 78 | Temp 97.7°F | Ht 64.0 in | Wt 121.0 lb

## 2017-10-20 DIAGNOSIS — M1612 Unilateral primary osteoarthritis, left hip: Secondary | ICD-10-CM

## 2017-10-20 MED ORDER — DICLOFENAC SODIUM 1 % TD GEL: 4.0000 g | Freq: Four times a day (QID) | TRANSDERMAL | 1 refills | Status: DC

## 2017-10-20 NOTE — Patient Instructions (Signed)
It was a pleasure to see you today! Thank you for choosing [facility] for your primary care. Heather [name] was seen for her left hip pain.   Our plans for today were:  Please start taking Voltaren gel.  Rub this gel on the area that hurts as often as needed.  We will refer you to Dr. Durward Fortes for a hip injection for your left hip.  You should return to our clinic to see me in 4 weeks to check on your pain.   Best,  Dr. Shan Levans

## 2017-10-20 NOTE — Progress Notes (Signed)
Subjective:    Heather [name] - 83 y.o. female MRN [id number]  Date of birth: 1918/10/19  HPI  Heather [name] is here for continued left-sided back pain. - radiates from her mid lower left back to her left groin, sometimes it "takes her breath away." - worse when she is standing up and walking - worried that her left side is "drawn up," including her left labia - worried about her weight loss, says she is "nothing but skin and bone" - believes her pain is due to a 2 mm nephrolithiasis visualized in late October - cannot remember if she took the Naproxen that was prescribed at her last visit     Health Maintenance:  Health Maintenance Due  Topic Date Due  . TETANUS/TDAP  06/13/1939  . PNA vac Low Risk Adult (1 of 2 - PCV13) 06/12/1985    -  reports that  has never smoked. she has never used smokeless tobacco. - Review of Systems: Per HPI. - Past Medical History: Patient Active Problem List   Diagnosis Date Noted  . Arthritis of left hip 10/20/2017  . Functional abdominal pain syndrome 09/02/2017  . Encounter for chronic pain management 07/22/2017  . Dementia 06/25/2017  . Vitamin D deficiency 05/21/2017  . Abnormal CT scan of lung 07/16/2015  . Esophageal dysmotility 10/27/2014  . Failure to thrive in adult 04/26/2014  . Arthritis of hand, degenerative 04/10/2014  . Other and unspecified hyperlipidemia 04/10/2014  . LPRD (laryngopharyngeal reflux disease) 04/04/2014  . Chronic bronchitis ([place]) 05/30/2013  . Hx of breast cancer 08/30/2012  . Edema 06/12/2011  . GERD 03/27/2010  . Diastolic CHF ([place]) 35/57/3220  . Constipation 01/18/2009  . Carpal tunnel syndrome on both sides 10/03/2008  . Cervical spondylosis without myelopathy 03/21/2008  . Diaphragmatic hernia 12/26/2007  . Renal cyst 12/26/2007  . Primary hypertension 09/15/2007  . Venous (peripheral) insufficiency 09/15/2007  . Osteoarthritis 09/15/2007  . Lumbago 09/15/2007  . Diverticulosis of colon  05/24/2002   - Medications: reviewed and updated   Objective:   Physical Exam BP 112/60   Pulse 78   Temp 97.7 F (36.5 C) (Oral)   Ht 5\' 4"  (1.626 m)   Wt 121 lb (54.9 kg)   SpO2 97%   BMI 20.77 kg/m  Gen: NAD, alert, cooperative with exam, well-appearing HEENT: NCAT, clear conjunctiva CV: RRR, good S1/S2, no murmur, no edema, capillary refill brisk  Resp: CTABL, no wheezes, non-labored Abd: SNTND, BS present, no guarding or organomegaly, tender to palpation along abdomen Musculoskeletal: tender to palpation along left hip, limited and painful internal and external rotation of left leg, positive log roll test.  Can get on exam table without assistance. Skin: no rashes, normal turgor  Neuro: no gross deficits. Stooped gait but no hip drop. Psych: good insight, alert and oriented, some perseveration and repetitive questioning        Assessment & Plan:   Arthritis of left hip Her symptoms and physical exam are consistent with pain due to osteoarthritis of the left hip.  Patient was counseled on this and was told that osteoarthritis cannot be cured but that it may be possible to control her pain.  She was reassured that nephrolithiasis was unlikely to be the cause of her pain.  She was prescribed voltaren gel and is agreeable to getting an intraarticular injection of her left hip.  She says she has seen Dr. Durward Fortes multiple times in the past for various other injections and would like to  see him again, so we will refer her to his clinic for left hip injection.    Maia Breslow, M.D. 10/20/2017, 5:19 PM PGY-1, [place]

## 2017-10-20 NOTE — Assessment & Plan Note (Signed)
Her symptoms and physical exam are consistent with pain due to osteoarthritis of the left hip.  Patient was counseled on this and was told that osteoarthritis cannot be cured but that it may be possible to control her pain.  She was reassured that nephrolithiasis was unlikely to be the cause of her pain.  She was prescribed voltaren gel and is agreeable to getting an intraarticular injection of her left hip.  She says she has seen Dr. Durward Fortes multiple times in the past for various other injections and would like to see him again, so we will refer her to his clinic for left hip injection.

## 2017-10-30 DIAGNOSIS — N3281 Overactive bladder: Secondary | ICD-10-CM | POA: Diagnosis not present

## 2017-11-13 ENCOUNTER — Ambulatory Visit: Payer: Medicare Other | Admitting: Family Medicine

## 2017-11-13 ENCOUNTER — Other Ambulatory Visit: Payer: Self-pay

## 2017-11-13 ENCOUNTER — Encounter: Payer: Self-pay | Admitting: Family Medicine

## 2017-11-13 VITALS — BP 138/70 | HR 81 | Temp 98.3°F | Ht 64.0 in | Wt 123.0 lb

## 2017-11-13 DIAGNOSIS — M1612 Unilateral primary osteoarthritis, left hip: Secondary | ICD-10-CM | POA: Diagnosis not present

## 2017-11-13 DIAGNOSIS — G8929 Other chronic pain: Secondary | ICD-10-CM

## 2017-11-13 NOTE — Assessment & Plan Note (Signed)
Heather [name] pain is concentrated in her left abdomen and right posterior thigh.  She may benefit from hip injections, which I hope she can receive at Dr. Rudene Anda office.  Since she hopes to decrease her medication use and seems to have adverse reactions to various pain medications, I advised pain management with Tylenol only.  I would like for her to discuss cutting down on her Lasix with her cardiologist at her next visit since she is complaining of increased urinary incontinence at every visit.

## 2017-11-13 NOTE — Progress Notes (Signed)
Subjective:    KHAMIL LAMICA - 83 y.o. female MRN [id number]  Date of birth: 1918-10-31  HPI  Forrestine MANASI DISHON is here for chronic left hip and abdominal pain.  She says that the voltaren gel made her pain worse and that she does not take the Tramadol previously prescribed because she did not want to deal with the constipation as a side effect.  She is worried about a variety of pains including pain in her right shoulder, pain in her right hip, and abdominal pain.  She is also worried about weight loss and having cancer, although she denies any specific symptoms including blood in her stool or decreased appetite.  She will be seeing Dr. Durward Fortes on Monday.  She also would like to decrease the number of medications she is on but says that she does not want to stop taking any of the medications for her heart.     Health Maintenance:  Health Maintenance Due  Topic Date Due  . TETANUS/TDAP  06/13/1939  . PNA vac Low Risk Adult (1 of 2 - PCV13) 06/12/1985    -  reports that  has never smoked. she has never used smokeless tobacco. - Review of Systems: Per HPI. - Past Medical History: Patient Active Problem List   Diagnosis Date Noted  . Arthritis of left hip 10/20/2017  . Functional abdominal pain syndrome 09/02/2017  . Encounter for chronic pain management 07/22/2017  . Dementia 06/25/2017  . Vitamin D deficiency 05/21/2017  . Abnormal CT scan of lung 07/16/2015  . Esophageal dysmotility 10/27/2014  . Failure to thrive in adult 04/26/2014  . Arthritis of hand, degenerative 04/10/2014  . Other and unspecified hyperlipidemia 04/10/2014  . LPRD (laryngopharyngeal reflux disease) 04/04/2014  . Chronic bronchitis ([place]) 05/30/2013  . Hx of breast cancer 08/30/2012  . Edema 06/12/2011  . GERD 03/27/2010  . Diastolic CHF ([place]) 25/03/3975  . Constipation 01/18/2009  . Carpal tunnel syndrome on both sides 10/03/2008  . Cervical spondylosis without myelopathy 03/21/2008  .  Diaphragmatic hernia 12/26/2007  . Renal cyst 12/26/2007  . Primary hypertension 09/15/2007  . Venous (peripheral) insufficiency 09/15/2007  . Osteoarthritis 09/15/2007  . Lumbago 09/15/2007  . Diverticulosis of colon 05/24/2002   - Medications: reviewed and updated   Objective:   Physical Exam BP 138/70   Pulse 81   Temp 98.3 F (36.8 C) (Oral)   Ht 5\' 4"  (1.626 m)   Wt 123 lb (55.8 kg)   SpO2 98%   BMI 21.11 kg/m  Gen: NAD, alert, cooperative with exam, elderly and well-appearing HEENT: NCAT, clear conjunctiva, supple neck CV: RRR, good S1/S2, no murmur Resp: CTABL, no wheezes, non-labored Abd: SNTND, BS present, no guarding or organomegaly Skin: no rashes, normal turgor  Neuro: no gross deficits.  Psych: good insight, alert and oriented, some mild dementia noted        Assessment & Plan:   Encounter for chronic pain management Ms. Yuille's pain is concentrated in her left abdomen and right posterior thigh.  She may benefit from hip injections, which I hope she can receive at Dr. Rudene Anda office.  Since she hopes to decrease her medication use and seems to have adverse reactions to various pain medications, I advised pain management with Tylenol only.  I would like for her to discuss cutting down on her Lasix with her cardiologist at her next visit since she is complaining of increased urinary incontinence at every visit.      Maia Breslow,  M.D. 11/13/2017, 4:39 PM PGY-1, [place]

## 2017-11-13 NOTE — Patient Instructions (Addendum)
It was a pleasure to see you today! Thank you for choosing [facility] for your primary care. Heather [name] was seen for left hip and abdominal pain.   Today, we talked about ways to manage your pain.  I think for now that Tylenol would be the best option for pain control since you are wanting to reduce the amount of medication you take and other medications do not agree with you.  I would like for you to see Dr. Durward Fortes on Monday and to talk to your heart doctor about decreasing some of your medication the next time you see him.  You should return to our clinic to see me in 6 months for a check up, or sooner if you need.   Best,  Dr. Shan Levans

## 2017-11-16 ENCOUNTER — Ambulatory Visit (INDEPENDENT_AMBULATORY_CARE_PROVIDER_SITE_OTHER): Payer: Medicare Other | Admitting: Orthopaedic Surgery

## 2017-11-16 ENCOUNTER — Encounter (INDEPENDENT_AMBULATORY_CARE_PROVIDER_SITE_OTHER): Payer: Self-pay | Admitting: Orthopaedic Surgery

## 2017-11-16 VITALS — BP 131/71 | HR 74 | Resp 14 | Ht 60.0 in | Wt 118.0 lb

## 2017-11-16 DIAGNOSIS — M47812 Spondylosis without myelopathy or radiculopathy, cervical region: Secondary | ICD-10-CM

## 2017-11-16 DIAGNOSIS — M1711 Unilateral primary osteoarthritis, right knee: Secondary | ICD-10-CM

## 2017-11-16 MED ORDER — LIDOCAINE HCL 1 % IJ SOLN
2.0000 mL | INTRAMUSCULAR | Status: AC | PRN
  Administered 2017-11-16: 2 mL

## 2017-11-16 MED ORDER — METHYLPREDNISOLONE ACETATE 40 MG/ML IJ SUSP
80.0000 mg | INTRAMUSCULAR | Status: AC | PRN
  Administered 2017-11-16: 80 mg

## 2017-11-16 MED ORDER — BUPIVACAINE HCL 0.5 % IJ SOLN
2.0000 mL | INTRAMUSCULAR | Status: AC | PRN
  Administered 2017-11-16: 2 mL via INTRA_ARTICULAR

## 2017-11-16 NOTE — Progress Notes (Signed)
Office Visit Note   Patient: Heather [name]           Date of Birth: February 13, 1918           MRN: [id number] Visit Date: 11/16/2017              Requested by: [place] Bing, DO [address] [place], [place] [address] PCP: [place] Bing, DO   Assessment & Plan: Visit Diagnoses:  1. Cervical spondylosis without myelopathy   2. Unilateral primary osteoarthritis, right knee     Plan: Osteoarthritis right knee. We'll repeat cortisone injection and monitor her response. Also has chronic back pain determined to be arthritis per prior films. Would like Dr. Ernestina Patches to reevaluate for consideration of injection  Follow-Up Instructions: Return if symptoms worsen or fail to improve.   Orders:  No orders of the defined types were placed in this encounter.  No orders of the defined types were placed in this encounter.     Procedures: Large Joint Inj: R knee on 11/16/2017 4:31 PM Indications: pain and diagnostic evaluation Details: 25 G 1.5 in needle, anteromedial approach  Arthrogram: No  Medications: 2 mL lidocaine 1 %; 2 mL bupivacaine 0.5 %; 80 mg methylPREDNISolone acetate 40 MG/ML Procedure, treatment alternatives, risks and benefits explained, specific risks discussed. Consent was given by the patient. Immediately prior to procedure a time out was called to verify the correct patient, procedure, equipment, support staff and site/side marked as required. Patient was prepped and draped in the usual sterile fashion.       Clinical Data: No additional findings.   Subjective: Chief Complaint  Patient presents with  . Right Knee - Weakness, Pain    Heather [name] is a 83 y o here today for chronic R knee pain also lumbar pain.  Heather [name] is had some recurrent pain and swelling in her right knee. Prior films demonstrate advanced osteoarthritis. Also having some recurrent back pain with prior MRI scan consistent with considerable degenerative changes. Has seen Dr. Ernestina Patches in  the past last for his help began  Review of Systems  Constitutional: Negative for chills, fatigue and fever.  Eyes: Negative for itching.  Respiratory: Negative for chest tightness and shortness of breath.   Cardiovascular: Positive for leg swelling. Negative for chest pain and palpitations.  Gastrointestinal: Negative for blood in stool, constipation and diarrhea.  Endocrine: Negative for polyuria.  Genitourinary: Positive for urgency. Negative for dysuria.  Musculoskeletal: Positive for back pain. Negative for joint swelling, neck pain and neck stiffness.  Allergic/Immunologic: Negative for immunocompromised state.  Neurological: Negative for dizziness and numbness.  Hematological: Does not bruise/bleed easily.  Psychiatric/Behavioral: The patient is not nervous/anxious.      Objective: Vital Signs: BP 131/71   Pulse 74   Resp 14   Ht 5' (1.524 m)   Wt 118 lb (53.5 kg)   BMI 23.05 kg/m   Physical Exam  Ortho Exam awake alert and oriented 3. Comfortable sitting. Walks with the use of a walker. Right knee with little bit of an effusion. Mostly medial joint pain. Lacks a few degrees to full extension and flexes about 100 without instability. No popliteal. No calf pain. +1 pulses.  Specialty Comments:  No specialty comments available.  Imaging: No results found.   PMFS History: Patient Active Problem List   Diagnosis Date Noted  . Arthritis of left hip 10/20/2017  . Functional abdominal pain syndrome 09/02/2017  . Encounter for chronic pain management 07/22/2017  .  Dementia 06/25/2017  . Vitamin D deficiency 05/21/2017  . Abnormal CT scan of lung 07/16/2015  . Esophageal dysmotility 10/27/2014  . Failure to thrive in adult 04/26/2014  . Arthritis of hand, degenerative 04/10/2014  . Other and unspecified hyperlipidemia 04/10/2014  . LPRD (laryngopharyngeal reflux disease) 04/04/2014  . Chronic bronchitis ([place]) 05/30/2013  . Hx of breast cancer 08/30/2012  . Edema  06/12/2011  . GERD 03/27/2010  . Diastolic CHF ([place]) 09/62/8366  . Constipation 01/18/2009  . Carpal tunnel syndrome on both sides 10/03/2008  . Cervical spondylosis without myelopathy 03/21/2008  . Diaphragmatic hernia 12/26/2007  . Renal cyst 12/26/2007  . Primary hypertension 09/15/2007  . Venous (peripheral) insufficiency 09/15/2007  . Osteoarthritis 09/15/2007  . Lumbago 09/15/2007  . Diverticulosis of colon 05/24/2002   Past Medical History:  Diagnosis Date  . Allergic rhinitis, cause unspecified 02/21/2014  . Anxiety state, unspecified   . Breast cancer ([place])    NO STICK OR BLOOD PRESSURE CHECKS IN LEFT ARM  . Carpal tunnel syndrome   . Cervical spondylosis   . Colon polyp 12/19/2009   Transverse-polypoid colorectal mucosa  . Constipation   . Degenerative joint disease   . Diastolic dysfunction   . Diverticula of colon   . Gastritis, chronic   . Hiatal hernia   . Hypertension   . Low back pain   . RBBB (right bundle branch block with left anterior fascicular block)   . Renal cyst   . Venous insufficiency     Family History  Problem Relation Age of Onset  . Colon cancer Neg Hx     Past Surgical History:  Procedure Laterality Date  . CARPAL TUNNEL RELEASE    . Left mastectomy     Social History   Occupational History  . Occupation: Retired    Fish farm manager: RETIRED  Tobacco Use  . Smoking status: Never Smoker  . Smokeless tobacco: Never Used  Substance and Sexual Activity  . Alcohol use: No    Alcohol/week: 0.0 oz  . Drug use: No  . Sexual activity: Not on file

## 2017-11-20 DIAGNOSIS — N3281 Overactive bladder: Secondary | ICD-10-CM | POA: Diagnosis not present

## 2017-11-26 ENCOUNTER — Other Ambulatory Visit: Payer: Self-pay | Admitting: Family Medicine

## 2017-11-26 ENCOUNTER — Telehealth: Payer: Self-pay | Admitting: Family Medicine

## 2017-11-26 NOTE — Telephone Encounter (Signed)
How should I get go about referring patient to PT/OT?   [name], [place], PGY-2

## 2017-11-26 NOTE — Telephone Encounter (Signed)
Routing to Referral Coordinator, Kennyth Lose. Hubbard Hartshorn, RN, BSN

## 2017-11-26 NOTE — Telephone Encounter (Signed)
One of [facility]'s physicians went out to visit this pt on 11/02/17 and he feels she needs home health, PT and OT for mobility and safety issues in her home.Pt was deconditioned. PCP will need to send referral to [facility] for PT and OT for pt. Please advise

## 2017-11-27 ENCOUNTER — Other Ambulatory Visit: Payer: Self-pay | Admitting: Family Medicine

## 2017-11-27 DIAGNOSIS — R627 Adult failure to thrive: Secondary | ICD-10-CM

## 2017-11-28 ENCOUNTER — Ambulatory Visit (HOSPITAL_COMMUNITY)
Admission: EM | Admit: 2017-11-28 | Discharge: 2017-11-28 | Disposition: A | Discharge status: Home / Self Care | Payer: Medicare Other | Attending: Family Medicine | Admitting: Family Medicine

## 2017-11-28 ENCOUNTER — Other Ambulatory Visit: Payer: Self-pay

## 2017-11-28 ENCOUNTER — Encounter (HOSPITAL_COMMUNITY): Payer: Self-pay | Admitting: Emergency Medicine

## 2017-11-28 DIAGNOSIS — K5732 Diverticulitis of large intestine without perforation or abscess without bleeding: Secondary | ICD-10-CM | POA: Diagnosis not present

## 2017-11-28 MED ORDER — AMOXICILLIN-POT CLAVULANATE 875-125 MG PO TABS: 1.0000 | ORAL_TABLET | Freq: Two times a day (BID) | ORAL | 0 refills | Status: DC

## 2017-11-28 NOTE — ED Triage Notes (Signed)
Abdominal pain for 8-9 months.  Pain has gradually worsened.  Last bm was yesterday-normal.

## 2017-11-28 NOTE — ED Provider Notes (Signed)
[facility]   [id number] 11/28/17 Arrival Time: 1229   SUBJECTIVE:  Heather [name] is a 84 y.o. female who presents to the urgent care with complaint of Abdominal pain for 8-9 months.  Pain has gradually worsened.  Last bm was yesterday-normal.    CT of renal system last October showed small kidney stone.  U/A last week was normal    Past Medical History:  Diagnosis Date  . Allergic rhinitis, cause unspecified 02/21/2014  . Anxiety state, unspecified   . Breast cancer ([place])    NO STICK OR BLOOD PRESSURE CHECKS IN LEFT ARM  . Carpal tunnel syndrome   . Cervical spondylosis   . Colon polyp 12/19/2009   Transverse-polypoid colorectal mucosa  . Constipation   . Degenerative joint disease   . Diastolic dysfunction   . Diverticula of colon   . Gastritis, chronic   . Hiatal hernia   . Hypertension   . Low back pain   . RBBB (right bundle branch block with left anterior fascicular block)   . Renal cyst   . Venous insufficiency    Family History  Problem Relation Age of Onset  . Colon cancer Neg Hx    Social History   Socioeconomic History  . Marital status: Widowed    Spouse name: Not on file  . Number of children: 1  . Years of education: Not on file  . Highest education level: Not on file  Social Needs  . Financial resource strain: Not on file  . Food insecurity - worry: Not on file  . Food insecurity - inability: Not on file  . Transportation needs - medical: Not on file  . Transportation needs - non-medical: Not on file  Occupational History  . Occupation: Retired    Fish farm manager: RETIRED  Tobacco Use  . Smoking status: Never Smoker  . Smokeless tobacco: Never Used  Substance and Sexual Activity  . Alcohol use: No    Alcohol/week: 0.0 oz  . Drug use: No  . Sexual activity: Not on file  Other Topics Concern  . 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 [place], RN, BSN                                                                                                 No outpatient medications have been marked as taking for the 11/28/17 encounter [facility] Encounter).   Allergies  Allergen Reactions  . Fentanyl Other (See Comments)    REACTION: nausea -  pt states that she is not allergic  REACTION: nausea - pt states that she is not allergic       ROS: As per HPI, remainder of ROS negative.   OBJECTIVE:   Vitals:   11/28/17 1344  BP: 136/70  Pulse: 75  Resp: (!) 22  Temp: 98.2 F (36.8 C)  TempSrc: Oral  SpO2: 97%     General appearance: alert; no distress Eyes: PERRL; EOMI; conjunctiva normal HENT: normocephalic; atraumatic;  external ears normal without trauma; nasal mucosa normal; oral mucosa normal Neck: supple Lungs: clear to auscultation bilaterally Heart: regular rate and rhythm Abdomen: soft, tender LLQ; bowel sounds normal; no masses or organomegaly; no guarding or rebound tenderness Back: no CVA tenderness Extremities: no cyanosis or edema; symmetrical with no gross deformities Skin: warm and dry Neurologic: normal gait; grossly normal Psychological: alert and cooperative; normal mood and affect      Labs:  Results for orders placed or performed during the hospital encounter of 09/09/17  Urinalysis, Routine w reflex microscopic- may I&O cath if menses  Result Value Ref Range   Color, Urine STRAW (A) YELLOW   APPearance CLEAR CLEAR   Specific Gravity,  Urine 1.008 1.005 - 1.030   pH 6.0 5.0 - 8.0   Glucose, UA NEGATIVE NEGATIVE mg/dL   Hgb urine dipstick SMALL (A) NEGATIVE   Bilirubin Urine NEGATIVE NEGATIVE   Ketones, ur NEGATIVE NEGATIVE mg/dL   Protein, ur NEGATIVE NEGATIVE mg/dL   Nitrite NEGATIVE NEGATIVE   Leukocytes, UA TRACE (A) NEGATIVE   RBC / HPF 0-5 0 - 5 RBC/hpf   WBC, UA 0-5 0 - 5 WBC/hpf   Bacteria, UA NONE SEEN NONE SEEN   Squamous Epithelial / LPF NONE SEEN NONE SEEN  Comprehensive metabolic panel  Result Value Ref Range   Sodium 140 135 - 145 mmol/L   Potassium 3.9 3.5 - 5.1 mmol/L   Chloride 104 101 - 111 mmol/L   CO2 27 22 - 32 mmol/L   Glucose, Bld 92 65 - 99 mg/dL   BUN 18 6 - 20 mg/dL   Creatinine, Ser 0.91 0.44 - 1.00 mg/dL   Calcium 9.3 8.9 - 10.3 mg/dL   Total Protein 6.7 6.5 - 8.1 g/dL   Albumin 3.9 3.5 - 5.0 g/dL   AST 29 15 - 41 U/L   ALT 20 14 - 54 U/L   Alkaline Phosphatase 34 (L) 38 - 126 U/L   Total Bilirubin 1.0 0.3 - 1.2 mg/dL   GFR calc non Af Amer 51 (L) >60 mL/min   GFR calc Af Amer 59 (L) >60 mL/min   Anion gap 9 5 - 15  CBC with Differential/Platelet  Result Value Ref Range   WBC 5.6 4.0 - 10.5 K/uL   RBC 4.13 3.87 - 5.11 MIL/uL   Hemoglobin 12.7 12.0 - 15.0 g/dL   HCT 38.7 36.0 - 46.0 %   MCV 93.7 78.0 - 100.0 fL   MCH 30.8 26.0 - 34.0 pg   MCHC 32.8 30.0 - 36.0 g/dL   RDW 13.7 11.5 - 15.5 %   Platelets 201 150 - 400 K/uL   Neutrophils Relative % 49 %   Neutro Abs 2.8 1.7 - 7.7 K/uL   Lymphocytes Relative 37 %   Lymphs Abs 2.1 0.7 - 4.0 K/uL   Monocytes Relative 13 %   Monocytes Absolute 0.7 0.1 - 1.0 K/uL   Eosinophils Relative 1 %   Eosinophils Absolute 0.1 0.0 - 0.7 K/uL  Basophils Relative 0 %   Basophils Absolute 0.0 0.0 - 0.1 K/uL    Labs Reviewed - No data to display  No results found.     ASSESSMENT & PLAN:  1. Diverticulitis of colon     Meds ordered this encounter  Medications  . amoxicillin-clavulanate (AUGMENTIN) 875-125 MG tablet    Sig:  Take 1 tablet by mouth every 12 (twelve) hours.    Dispense:  14 tablet    Refill:  0    Reviewed expectations re: course of current medical issues. Questions answered. Outlined signs and symptoms indicating need for more acute intervention. Patient verbalized understanding. After Visit Summary given.    Procedures:      Robyn Haber, MD 11/28/17 1416

## 2017-12-01 ENCOUNTER — Ambulatory Visit: Payer: Medicare Other | Admitting: Family Medicine

## 2017-12-01 ENCOUNTER — Encounter: Payer: Self-pay | Admitting: Family Medicine

## 2017-12-01 ENCOUNTER — Other Ambulatory Visit: Payer: Self-pay

## 2017-12-01 VITALS — BP 118/60 | HR 83 | Temp 98.7°F | Ht 60.0 in | Wt 117.0 lb

## 2017-12-01 DIAGNOSIS — R1030 Lower abdominal pain, unspecified: Secondary | ICD-10-CM

## 2017-12-01 DIAGNOSIS — N3942 Incontinence without sensory awareness: Secondary | ICD-10-CM

## 2017-12-01 DIAGNOSIS — R109 Unspecified abdominal pain: Secondary | ICD-10-CM | POA: Diagnosis not present

## 2017-12-01 MED ORDER — TRAMADOL HCL 50 MG PO TABS: 50.0000 mg | ORAL_TABLET | Freq: Three times a day (TID) | ORAL | 0 refills | Status: DC | PRN

## 2017-12-01 NOTE — Assessment & Plan Note (Addendum)
This is an ongoing problem for this patient, and it is likely due to her anxiety and age.  I am prescribing patient Tramadol since she says this has worked well for her in the past.  Patient seems to do well with reassurance and minimal medications.  I do not feel that continuing Augmentin is necessary due to its side effects and the low likelihood that the patient has an acute episode of diverticulitis.

## 2017-12-01 NOTE — Patient Instructions (Signed)
It was a pleasure to see you today! Thank you for choosing [facility] for your primary care. Heather [name] was seen for stomach pain and frequent urination.   Our plans for today were:  Take Tylenol and Tramadol as needed according to medicine bottle directions for pain  Talk with your heart doctor about whether or not to continue Lasix  Dr. Romona Curls office should call you to schedule your appointment with him about your back pain  You should return to our clinic to see me as needed for your concerns.   Best,  Dr. Shan Levans

## 2017-12-01 NOTE — Progress Notes (Signed)
Subjective:    Heather [name] - 84 y.o. female MRN [id number]  Date of birth: 1918-05-13  HPI  Heather [name] is here for her chronic concerns of abdominal pain and frequent urination.  Abdominal pain Heather [name] was recently seen in Urgent Care for this complaint and was diagnosed with diverticulitis and prescribed Augmentin.  She took a few doses of Augmentin, but it made her feel dizzy and weak so she stopped taking it.  She denies any fever, chills, changes in bowel movements or changes in appetite.  She would like a prescription for Tramadol for her chronic pain since it has worked well for her in the past.  Urinary frequency Heather [name] wears Depends underwear and frequently soaks through five or six pairs of this underwear in one morning even with going to the bathroom.  She has stopped taking the Lasix prescribed to her by her cardiologist due to this urinary frequency, but her frequency has not improved.  She drinks a lot of water each day.  She has a good appetite and has not felt dizzy except when taking the Augmentin.  This problem has been going on for years and she doesn't understand why she has this issue.     Health Maintenance:  Health Maintenance Due  Topic Date Due  . TETANUS/TDAP  06/13/1939  . PNA vac Low Risk Adult (1 of 2 - PCV13) 06/12/1985    -  reports that  has never smoked. she has never used smokeless tobacco. - Review of Systems: Per HPI. - Past Medical History: Patient Active Problem List   Diagnosis Date Noted  . Urinary incontinence without sensory awareness 12/02/2017  . Arthritis of left hip 10/20/2017  . Functional abdominal pain syndrome 09/02/2017  . Encounter for chronic pain management 07/22/2017  . Dementia 06/25/2017  . Vitamin D deficiency 05/21/2017  . Abnormal CT scan of lung 07/16/2015  . Esophageal dysmotility 10/27/2014  . Failure to thrive in adult 04/26/2014  . Arthritis of hand, degenerative 04/10/2014  . Other and  unspecified hyperlipidemia 04/10/2014  . LPRD (laryngopharyngeal reflux disease) 04/04/2014  . Chronic bronchitis ([place]) 05/30/2013  . Hx of breast cancer 08/30/2012  . Edema 06/12/2011  . GERD 03/27/2010  . Diastolic CHF ([place]) 82/42/3536  . Constipation 01/18/2009  . Carpal tunnel syndrome on both sides 10/03/2008  . Cervical spondylosis without myelopathy 03/21/2008  . Diaphragmatic hernia 12/26/2007  . Renal cyst 12/26/2007  . Primary hypertension 09/15/2007  . Venous (peripheral) insufficiency 09/15/2007  . Osteoarthritis 09/15/2007  . Lumbago 09/15/2007  . Diverticulosis of colon 05/24/2002   - Medications: reviewed and updated   Objective:   Physical Exam BP 118/60   Pulse 83   Temp 98.7 F (37.1 C) (Oral)   Ht 5' (1.524 m)   Wt 117 lb (53.1 kg)   SpO2 98%   BMI 22.85 kg/m  Gen: NAD, alert, cooperative with exam, frail but good historian HEENT: NCAT, clear conjunctiva, supple neck CV: RRR, good S1/S2, no murmur, no edema, capillary refill brisk  Resp: CTABL, no wheezes, non-labored Abd: tender to palpation throughout (stable from previous exams) but no guarding, BS present, no organomegaly Skin: no rashes, normal turgor  Neuro: no gross deficits.  Psych: good insight, alert and oriented   Assessment & Plan:   Functional abdominal pain syndrome This is an ongoing problem for this patient, and it is likely due to her anxiety and age.  I am prescribing patient Tramadol since she says this  has worked well for her in the past.  Patient seems to do well with reassurance and minimal medications.  I do not feel that continuing Augmentin is necessary due to its side effects and the low likelihood that the patient has an acute episode of diverticulitis.    Urinary incontinence without sensory awareness This is a chronic problem for this patient and is likely due to age-related incontinence, frequent water intake, and lasix usage.  I doubt that this problem will get better,  but I encouraged her to speak with her cardiologist about whether the dose of Lasix should be adjusted or discontinued.    Maia Breslow, M.D. 12/02/2017, 4:59 PM PGY-1, [place]

## 2017-12-02 DIAGNOSIS — N3942 Incontinence without sensory awareness: Secondary | ICD-10-CM | POA: Insufficient documentation

## 2017-12-02 NOTE — Assessment & Plan Note (Signed)
This is a chronic problem for this patient and is likely due to age-related incontinence, frequent water intake, and lasix usage.  I doubt that this problem will get better, but I encouraged her to speak with her cardiologist about whether the dose of Lasix should be adjusted or discontinued.

## 2017-12-07 ENCOUNTER — Ambulatory Visit: Payer: Medicare Other | Admitting: Pulmonary Disease

## 2017-12-07 ENCOUNTER — Encounter: Payer: Self-pay | Admitting: Pulmonary Disease

## 2017-12-07 ENCOUNTER — Ambulatory Visit (INDEPENDENT_AMBULATORY_CARE_PROVIDER_SITE_OTHER)
Admission: RE | Admit: 2017-12-07 | Discharge: 2017-12-07 | Disposition: A | Discharge status: Home / Self Care | Payer: Medicare Other | Source: Ambulatory Visit | Attending: Pulmonary Disease | Admitting: Pulmonary Disease

## 2017-12-07 VITALS — BP 92/60 | HR 97 | Temp 98.0°F | Ht 61.0 in | Wt 117.4 lb

## 2017-12-07 DIAGNOSIS — I5032 Chronic diastolic (congestive) heart failure: Secondary | ICD-10-CM

## 2017-12-07 DIAGNOSIS — K449 Diaphragmatic hernia without obstruction or gangrene: Secondary | ICD-10-CM | POA: Diagnosis not present

## 2017-12-07 DIAGNOSIS — I10 Essential (primary) hypertension: Secondary | ICD-10-CM | POA: Diagnosis not present

## 2017-12-07 DIAGNOSIS — K219 Gastro-esophageal reflux disease without esophagitis: Secondary | ICD-10-CM

## 2017-12-07 DIAGNOSIS — R918 Other nonspecific abnormal finding of lung field: Secondary | ICD-10-CM

## 2017-12-07 DIAGNOSIS — J410 Simple chronic bronchitis: Secondary | ICD-10-CM | POA: Diagnosis not present

## 2017-12-07 DIAGNOSIS — K224 Dyskinesia of esophagus: Secondary | ICD-10-CM

## 2017-12-07 NOTE — Patient Instructions (Signed)
Today we updated your med list in our EPIC system...    Continue your current medications the same...  Today we did a follow up CXR...    We will contact you w/ the results when available...   Stay as active as possible-- Silver sneakers is a good program...  Keep up the good work w/ your intake-- 3 meals + Boost etc...  Remember-- NO SALT or Sodium in your diet...  Call for any questions...  Let's plan a follow up visit in 55mo, sooner if needed for breathing problems.Marland KitchenMarland Kitchen

## 2017-12-07 NOTE — Progress Notes (Signed)
Subjective:    Patient ID: Heather [name], female    DOB: [birth year], 84 y.o.   MRN: [id number]  HPI 84 y/o BF here for a follow up visit... she has multiple medical problems including:  Dyspnea & Anxiety;  HBP;  Hx CP & DiastolicCHF followed by [name];  HH/ Gastritis;  Divertics/ Constip;  Functional Abd Pain;  Renal cyst followed by Heather [name];  Hx left breast [phone];  DJD/ CSpine spondy/ LBP/ CTS; and she has a cancer phobia...   ~  SEE PREV EPIC NOTES FOR OLDER DATA >> She has established Primary Care follow up w/ Heather [name] => Heather [name], Heather [name]...   CXR 10/14 showed normal heart size w/ tortuous atherosclerotic Ao, clear lungs w/ ?vague opac left mid zone adjacent to hilum (nothing seen in this area on CT 3/14) ?overlying shadows & we will f/u...  Ambulatory O2 check 10/14> O2 sat= 98% on room air at rest w/ pulse=83, regular; Ambulated 3 laps w/ lowest O2 sat= 96% w/ pulse=108...  LABS 10/14:  Chems- wnl;  CBC- wnl;  TSH=2.04;  VitD=71;  Sed=11...  ~  Heather [name] has established w/ Heather [name] for Primary care and has seen him x3 already... She still sees Heather [name] for ENT and Heather [name] for [name]>>  Baseline CXRs showed sl overinflation, clear lungs, normal heart size w/ elongation & tortuous Ao...  Prev CT Chest/ CTA 3/14 showed no PE, no acute findings, +atherosclerotic changes, several scattered tiny nodules 3-81m size & no change from 2009 scan, s/p left mastectomy, DJD Tspine...   PFTs 9/13 were WNL> FVC=2.62 (128%), FEV1=2.20 (158%), %1sec=84; mid-flows=274% of predicted  CXR 04/04/14 shows stable heart size, atherosclerotic & tortuous Ao, mild hyperinflation, clear lungs w/o acute changes, DJD Tspine...   PFT 07/03/14> despite mult attempts w/ several test administrators/ staff- she was unable to perform the procedure due to inability to cooperate...  Ambulatory O2sat Test 07/03/14 on RA>  Baseline O2sat= 99% w/ pulse=86;  After 3 laps her nadir O2sat= 96% w/ max pulse=  114...  ~  September 21, 2014:  314moOV & pt added-on today at her request for mult symptoms> but on questioning it is apparent that her symptomatology is chronic w/o acute features; she notes cough ("hocking cough"), thick white sputum ("with white balls"), chest congestion; but she denies to me any f/c/s, CP, hemoptysis, etc and she notes that "my breathing is good";;;  Then she launches into a litany of complaints- poor appetite, wt loss ("I'm nothing but a frame"), back pain ("the drawing kind"), and general flight of ideas;  Meds reviewed & include prev ZPak & Mucinex (?taking 1 Qid);  Exam is neg w/ clear chest- no wheezing, rales, or rhonchi hear;  Last CXR was 5/15 showing essentially clear lungs, NAD...    She saw [name] 9/15> Hx HBP, CHF/ DD, AtypCP, RBBB, neg Myoview 2009; on Coreg3.125Bid, Amlod2.5, Lasix40 as needed for swelling; BP= 112/60 today...     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     Hx mult Ortho complaints and LBP evaluated by Heather [name] w/ Epid shots per [name] but she claims no improvement; notes Tramadol doesn't help and want Vicodin refilled, she wears a back brace...     Very anxious and has Celexa10 & Alprazolam 0.45m30mn her med list...   EKG 9/15 showed NSR, rate81, LAD, RBBB, NSSTTWA, NAD...Marland KitchenMarland Kitchen  PLAN>>  Heather [name] is reassured- her breathing is stable w/o acute lung problems at this time; she is asked to continue the Mucinex669m Qid w/ fluids, and remain as active as possible; reminded to use her ProtonixBid & follow antireflux regimen to help her LPR;  She will f/u w/ her other physicians and see me in 6 months for re-evaluation & f/u CXR...  ~  Mar 22, 2015:  6581moOV & Heather [name] says "I'm just skin & bones" weight is noted to be down 15# to 120# today (BMI=22);  She says that her dentist noted her  teeth are wearing out & this may acct for some wt loss- needs to incr nutritional supplement like BOOST Bid;  She persists w/ mult medical complaints- "this swallowing business" and she is seeing both [name] and [name] for help w/ reflux and cancer phobia "it can show up anywhere";  From the resp standpoint she notes same "hocking" throat clearing cough w/ thick, white, foamy sput; she notes SOB/DOE w/o change and she is still not taking the Alpraz or the Mucinex as requested...      She saw Heather [name] 1/16> note reviewed & felt to be stable... She tells me she has switched Primary Care to Heather [name].      She saw Heather [name] 1/16 and they felt no further [name] w/u warranted, use abd binder prn...      She saw Heather [name], [name] 2/16> bloating, constip, wt loss> note reviewed EXAM reveals Afeb, VSS, O2sat=99% on RA;  HEENT- neg x sl dry MMs;  Chest- she has a forced dry hacking cough that is nonproductive, clear w/o w/r/r;  Heart- RR w/o m/r/g heard;  Abd- soft, nontender, neg...  We reviewed prob list, meds, xrays and labs> see below for updates >>   LABS 11/2014 were WNL...Marland KitchenMarland KitchenCXR 03/22/15 showed stable cardiomeg, chronic changes, old right rib fxs, NAD...  PLAN>>  Please Heather [name] take the Mucinex60061mone tab Qid w/ fluids, and try the Alprazolam 0.5mg57m2 tab Tid to start; I have tried to reassure her as best I can, she needs to eat better & take the nutritional supplement Bid...  (NOTE> I spent 25mi61mce-to-face w/ pt discussing her symptoms, examining pt, reviewing recommendations, and answering questions)  ~  July 16, 2015:  81mo R63mo [name] presents w/ her usual litany of somatic complaints- "terrible" she says; c/o feeling hot, eyes run water, had dental work done, still w/ mult [name] issues, unhappy w/ PCP, etc... She saw Heather [name] for [name] eval & his note is reviewed; she went to the ER 05/17/15 w/ [name] complaints and the ER physician ordered a CT Abd & Pelvis> a few tiny 3-4mm no61mes were noted in the lateral  aspect of the RML, the abd revealed aortoiliac calcif w/o aneurysm, right renal cyst, otherw no signif abn noted (Radiology rec f/u CT Chest in 70yr); s19yrs very concerned and I indicated that I thought this was prob some scar tissue but we would recheck her CXR in 58mo & fo29mo up the CT in 70yr...   33yr> she c/o cough "hoking little white balls"; baseline CXRs have been clear (old right rib fxs), PFT in 2013 was wnl, oxygenation is wnl, and exam is clear; she has been asked to use Mucinex Qid but she won't do it...    Abn CT scan w/ scat pulm nodules> prev CT Chest w/ tiny left apical oapc, unchanged serially & felt to be benign; CT Abd done  05/17/15 w/ tiny 3-65m RML nodules detected, otherw eg & we discussed f/u CXR in 639mo CT Chest in 1y2yr   HBP> on Coreg3.125Bid, Lasix40, off Amlod; BP= 106/60 & she persists w/ mult somatic complaints; last 2DEcho 10/2009 showed norm LV size & func w/ EF=55-60%, mildAI....    HxCP> on ASA81; followed by Heather [name]/ DrNishan for Cards & their prev notes are reviewed=> neg ischemic work up previously...     VI, Edema> she went to the ER 11/13 w/ edema; VDopplers were neg for DVT; they reiterated the recs for no salt, elevation, support hose, & continue Lasix40.     [name]- HH, Gastirits, Divertics, chronic pain & "swelling"> on Protonix40Bid plus Zantac, Miralax & Senakot-S, etc; she persists w/ mult [name] complaints despite prev evals by [facility], [facility], [facility]...    Renal cyst> she is very anxious about everything & has a cancer phobia; DrKimbrough followed this benign simple cyst for yrs- no change...     Hx Breast cancer> she had left mod radical mastectomy 1994 by DrBlievernicht- no known recurrence & she is given reassurance...     Ortho- DJD, Cspine spondy, LBP, CTS> followed by Heather [name] & he injected her right knee & her back; on Vicodin prn...    Anxiety> on Alpraz0.5mg53m but "I'm not taking it very often" she says & encouraged to use more regularly...   We reviewed prob list, meds, xrays and labs> see below for updates >> her PCP is DrHuArtist Beach[place] nutritionistTannH[place] Ba swallow 03/23/15 showed espohageal dysmotility w/ full column stasis and tertiart contractions, no stricture detected...  CT Abd & Pelvis 05/17/15 showed a few tiny 3-4mm 3mules were noted in the lateral aspect of the RML, the abd revealed aortoiliac calcif w/o aneurysm, right renal cyst, otherw no signif abn noted (Radiology rec f/u CT Chest in 16yr).48yrBS 05/17/15 via ER> Chems- wnl;  CBC- wnl... IMP/PLAN>>  I told Derenda that the tiny nodules seen in the RML area on CT Abd were likely benign & reassured her that we would follow this area closely w/ CXR in 19mo & 49mohest in 20mo; i20mo meanwhile she will continue her current meds & maintain follow up w/ her PCP & various specialists as needed...   ~  January 17, 2016:  19mo ROV 619morethea notes that her breathing is actually doing very well- SOB at baseline, denies CP, palpit, cough, sput, hemoptysis, etc... However she continues to have a litany of somatic complaints that she enumerates at each visit> she tells me that she has left [name] because he didn't know anything else to do & is now seeing Heather [name]-Alphonsa GinnbauNutriosos [name]... "I'm noting but skin & bones", "foood don't do me no good", "my appetite is good & I eat soul food", "I know what's going on w/ me" but she didn't elaborate, "some meds don't agree w/ others", last med was $400 she says (from Urology), "there's nothing in my stomach except my inards", she was upset w/ AVS showing diagnosis of anxiety...     AB> she c/o cough "hoking little white balls"; baseline CXRs have been clear (old right rib fxs), PFT in 2013 was wnl, oxygenation is wnl, and exam is clear; she has been asked to use Mucinex Qid but she won't do it...    Abn CT scan w/ scat pulm nodules> prev CT Chest w/ tiny left apical opac, unchanged serially & felt to be benign; CT Abd done  05/17/15 w/  tiny 3-43m RML nodules detected, otherw neg & we discussed f/u CXR in 6109mo CT Chest in 1y65yr   HxCP> on ASA81; followed by Heather [name]/ DrNishan for Cards & their prev notes are reviewed=> neg ischemic work up previously...     [name]- HH, Gastirits, Divertics, chronic pain & "swelling"> on Protonix40Bid plus Zantac, Miralax & Senakot-S, etc; she persists w/ mult [name] complaints despite prev evals by [facility], [facility], [facility]; most recent testing 09/2015 by Heather [name]- see imaging studies below;  ENT eval by DrJRosen...    Mult Medical Issues> now followed by PCP Heather [name] EXAM reveals Afeb, VSS, O2sat=99% on RA;  HEENT- neg x sl dry MMs;  Chest- she has a forced dry hacking cough that is nonproductive, clear w/o w/r/r;  Heart- RR w/o m/r/g heard;  Abd- soft, nontender, neg...   EKG 08/2015>  NSR, rate74, RBBB, otherw neg ekg...  CXR 09/2105>  Borderline heart size, tortuous Ao, clear sl hyperexpanded lungs, no adenopathy- NAD...   LABS in Epic 09/2015>  Chems- wnl;  CBC- wnl  Abd Sonar 11/2015> heterogeneous liver texture- rec MRI for further eval...  MRI Abd 11/2015> no suspicious hepatic lesions, incidental tiny cyst noted, otherw MRI of Abd was neg...   MBS 11/2015> mod oral phase dysphagia, see full report IMP/PLAN>>  Karmin is stable from the pulm standpoint;  She has mult somatic complaints & a large physician contingent attending to her various symptoms;  I suggested to her that she would be best served by taking the Xanax regularly 0.5mg59m/2 to 1 tab Tid regularly...   ~  May 28, 2016:  50mo 31mo& Heather [name]'s breathing is at baseline but she complained to her PCP about DOE w/ walking (uses walker) & he told her "I better get it checked out";  Today c/o "I'm just a frame- all skin & bones", "I stay hungry", "I eat soul food & I can't eat no more", "they've done a lot of tests and can't find nothing"; rambling hx & flight of ideas- she is c/o dental issues, ribs sticking out, & still c/o a  knot in her left flank area-- I reminded her to be sure to mention all this to her [name] (EdwaOletta Lamastho (WhitDurward FortesCP (Heather Rubenshe can check this out;  We talked about her abd/ back/ legs/ skin/ etc, she wants a stong pain pill, she says she's lonely at 89- "1l my friend's are gone"...    From the pulmonary standpoint-- she c/o being winded after long walks (uses walke- note: she had outpt PT 01/2016), cough w/ little white balls, etc;  Meds include Flonase Qhs and Mucinex600Bid w/ fluids;  Exam is clear & XRay/Labs in the last yr all wnl...  EXAM reveals Afeb, VSS, O2sat=98% on RA;  HEENT- neg x sl dry MMs;  Chest- she has a forced dry hacking cough that is nonproductive, clear w/o w/r/r;  Heart- RR w/o m/r/g heard;  Abd- soft, nontender, neg...   Art Dopplers of LEs 03/31/16>  Normal ABIs & no evid of arterial occlusive dis  LABS 04/2016 in epic>  Chems- wnl;  CBC- wnl  CT Abd & Pelvis 05/02/16>  Mod stool burden in right colon, no obstruction, NAD (she was seen in the Er for abd pain)  She had Speech Path swallowing eval 05/22/16>  Note reviewed- mild oral phase & pharyngeal dysphagia, no aspiration or penetration, esoph dysmotility; recommendations given to pt...  IMP/PLAN>>  Heather [name] appears stable- chest is  clear, oxygenation excellent, and she presents w/ the same mult somatic complaints; she still won't try the Xanax as I've suggested; f/u w/ me as needed...  ~  December 01, 2016:  27moROV & pulmonary followup visit>  Rylei [facility]; since I saw her last she has had 5+ PCP office visits and 7+ ER visits for a variety of complaints- divertics, ecchymoses, right arm pain, left groin discomfort;  Today she speaks rapidly w/ pressure of speech (barely taking a breath betw words), flight of ideas and mult somatic complaints... "Too much medicine for my age" "I'm nothing but a frame" she is still convinced there is something in her abd  despite mult evaluations over the yrs....    AB> she c/o cough "hoking little white balls"; baseline CXRs have been clear (old right rib fxs), PFT in 2013 was wnl, oxygenation is wnl, and exam is clear; she has been asked to use Mucinex Qid but she won't do it...    Abn CT scan w/ scat pulm nodules> prev CT Chest w/ tiny left apical opac, unchanged serially & felt to be benign; CT Abd done 05/17/15 w/ tiny 3-432mRML nodules detected, otherw neg & we discussed f/u CXR in 30m38moCT Chest in 40yr540yr  HxCP> on Coreg3.125Bid & Lasix40; BP=110/60 & she has a pos ROS; followed by Heather [name]/ DrNiCherly Hensen Cards & their prev notes are reviewed=> neg ischemic work up previously...     [name]- HH, Gastirits, Divertics, chronic pain & "swelling"> on Protonix40Bid plus Zantac, Miralax & Senakot-S, etc; she persists w/ mult [name] complaints despite prev evals by [facility], [facility], [facility]; most recent testing 09/2015 by Heather [name]- see imaging studies below;  ENT eval by DrJRosen...    Mult Medical Issues> now followed by PCP-CNche, but she has switched several times... EXAM reveals Afeb, VSS, O2sat=99% on RA;  HEENT- neg x sl dry MMs;  Chest- she has a forced dry hacking cough that is nonproductive, clear w/o w/r/r;  Heart- RR w/o m/r/g heard;  Abd- soft, nontender, neg...   CXR 07/28/16>  Mild cardiomegaly, left basilar atelectasis, distal left clavicular fx & old right post rib fxs...  CT Abd & Pelvis 10/2016>  Lung bases clear, aortic atherosclerosis, neg abd structures and no obstructive uropathy...  LABS 10/2016 in epic> Chems- wnl;  CBC- wnl IMP/PLAN>>  Heather [name] is stable from there pulmonary standpoint; encouraged to use Mucinex & drink plenty of water; needs to be careful & avoid falling!  ~  June 01, 2017:  30mo 43mo& Heather [name] notes that he breathing is good w/ min chr cough/ phlegm in those small white balls that she describes in detail; reminded to take the Mucinex600mgQ71m/ fluids on a regular dosing  schedule;  Her CC remains her stomach- c/o a knot in her abd that no one has been able to find w/ CT scans etc; "I've got a lot of arthritis" "I'm nothing but skin & bones" "I'm a grown woman & know what hurts me" and she is c/o LLQ/ left side/ left flank pain; "My skin is hanging"; Heather [name] still does her own meds despite what appears to be a mild dementia...    In the interval betw visits Epic indicates that Brynne has been c/o left hip pain, prev eval by Heather [name] for Ortho, c/o leg pain & swelling w/ PCP at [facility] placing her on Lasix, Aldactone, Coreg for this and HBP, diastolic CHF (  she has seen DrNishan in the past); she has had several visits w/ ER, PCP, ?[name] regarding her left sided abd pain; she switched PCP to Craig their notes are reviewed...  We reviewed the following medical problems during today's office visit >>     AB> she c/o cough "hoking little white balls"; baseline CXRs have been clear (old right rib fxs), PFT in 2013 was wnl, oxygenation is wnl, and exam is clear; she has been asked to use Mucinex Qid but she won't do it...    Abn CT scan w/ scat pulm nodules> prev CT Chest w/ tiny left apical opac, unchanged serially & felt to be benign; CT Abd done 05/17/15 w/ tiny 3-19m RML nodules detected, otherw neg & serial scans showed a 444mRML granuloma otherw NEG & no progressive lung changes...    HxCP> on Coreg3.125Bid & Lasix40; BP=110/60 & she has a pos ROS; followed by [name]/ [name] & their prev notes are reviewed=> neg ischemic work up previously...     [name]- HH, Gastirits, Divertics, chronic pain & "swelling"> on Protonix40Bid plus Zantac, Miralax & Senakot-S, etc; she persists w/ mult [name] complaints despite prev evals by [facility], [facility], [facility]; most recent testing 09/2015 by Heather [name]- see imaging studies below;  ENT eval by DrJRosen...    Mult Medical Issues> now followed by PCP-CNche, but she has switched several times... EXAM reveals  Afeb, VSS, O2sat=99% on RA;  HEENT- neg x sl dry MMs;  Chest- she has a forced dry hacking cough that is nonproductive, clear w/o w/r/r;  Heart- RR w/o m/r/g heard;  Abd- soft, nontender, neg...  IMP/PLAN>>  Wave remains stable from the pulmonary standpoint, her CC remains her enigmatic left sided abd discomfort & "knot"; I hav erec that she continue her Mucinex 60025mid w/ fluids; pulmonary follow up can be prn...      ~  December 07, 2017:  41mo21mo & Heather [name] notes in her rambling history that her breathing is good but her arthritis is bad, states that the prev left sided knot is gone "it's nothing but skin" she says, states massage at [employer] really helped, and [facility] [name] said she needs a physical;  [facility]... From the pulmonary perspective-- she notes min cough, denies much sput 7 there is no talk about the "little balls of white phlegm" as before, and she denies SOB today- indeed she is talking rapidly, rambling, and barely stops to take a breath...  Review of interval Epic medical records>      [name] f/u by Heather [name] 07/06/17 & 08/05/17>  Generalized abd pain, GERD, dysphagia, constipation; rec to take Miralax, & Protonix, and f/u prn...    Eval [facility] on 09/09/17>  C/o LLQ & left hip pain, noted to be very talkative & flight of ideas, worried that she might have been poisoned;  Labs were ok & UA clear, CT Renal stone protocol showed nonobstructing 2mm 97mt stone, mod fecal burden, DJD Lspine & mild L5 compression (old), aortic atherosclerosis;  Pt reassured, rec to f/u w/ Urology...     [facility]>  [facility] She had f/u w/ ORTHO- Heather [name] 11/16/17>  Neck & right knee pain- given knee injection w/ improvement...     [facility] c/o abd pain- she said present for 8-22mo, 422moe- clear, Labs- wnl, they felt  she had diverticulitis (no CT done) &  given Augmentin but it made her dizzy & weak so she stopped it & was given Tramadol for pain...  We reviewed the following medical problems during today's office visit>      AB> she notes min cough & not c/o sput or SOB; baseline CXRs have been clear (old right rib fxs), PFT in 2013 was wnl, oxygenation is wnl, and exam is clear; she has been asked to use Mucinex Qid but she won't do it...    Abn CT scan w/ scat pulm nodules> prev CT Chest w/ tiny left apical opac, unchanged serially & felt to be benign; CT Abd done 05/17/15 w/ tiny 3-261m RML nodules detected, otherw neg & serial scans showed a 461mRML granuloma otherw NEG & no progressive lung changes...    HxCP> on Coreg3.125Bid & Lasix20, & Aldactone25; BP=100/60 & she has a pos ROS; followed by Heather [name]/ DrNishan for Cards & their prev notes are reviewed=> neg ischemic work up previously...  EXAM reveals Afeb, VSS, O2sat=99% on RA;  HEENT- neg x sl dry MMs;  Chest- she has a forced dry hacking cough that is nonproductive, clear w/o w/r/r;  Heart- RR w/o m/r/g heard;  Abd- soft, nontender, neg...   CXR 12/07/17 (independently reviewed by me in the PACS system) showed borderline heart size, underlying COPD w/ clear lungs- NAD, old right rib deformities noted... IMP/PLAN>>  Syleena is [age]/o and seems to be doing very well overall except for her myriad complaints & concerns; I've always thought that she has a cancer phobia but she is impossible to reassure;  Stable from the pulmonary perspective & has not required antibiotics, inhalers, Pred, Oxygen, etc...  Follow up w/ usKoreaan be prn for breathing problems...   NOTE:  >50% of this 30 min visit was spent in counseling 7 coordination of care...           Problem List:  RESPIRATORY SYMPTOMS >> c/o "hocking phlegm" DYSPNEA (ICD-786.9) - eval 5/11 c/o SOB- can't get a DB, can't get enough air in, etc... occurs at rest & w/ exerc "I'm very active despite my arthritis", talks rapidly in long sentences  w/o apparent distress... no cough, phlegm, hemoptysis, etc- but as usual she is quite distressed by these symptoms and wants further eval> we discussed re-assessment w/ CXR (clear, NAD);  PFT (totally norm airflow);  Labs (all WNL);  Rx w/ ALPRAZOLAM 0.'5mg'$  Tid & encouraged to take regularly. ~  CXR 4/13 showed norm heart size, clear lungs, mild TSpine spondylosis... ~  She continues to complain of drainage, throat symptoms, etc; Rx w/ Mucinex, OTC antihist, Nasonex, etc;  Seen by ENT, DrRosen w/ ?xerostomia & rec incr water etc. ~  9/13: Add-on visit for "hocking, it's not a cough, it's a hock"> see above... ~  9/13: CXR shows sl overinflation, clear lungs, normal heart size w/ elongation & tortuous Ao;  PFTs are wnl> FVC=2.62 (128%), FEV1=2.20 (158%), %1sec=84; mid-flows=274% of predicted... We discussed trial rx for her symptoms- Mucinex, Fluids, Hycodan, Dulera100- 1puffbid. ~  10/13: [facility]/ throat & mouth symptoms "like glue" therefore stopped this med; advised to continue the others. ~  3/14:  CT Chest (done after call from Ortho PA regarding her symptoms)=> then CTA (done via an ER visit) showed no PE, no acute findings, +atherosclerotic changes, several scattered tiny nodules 3-61m21mize & no change from 2009 scan, s/p left mastectomy, DJD Tspine...  ~  4/14:  she is asked to increase the Mucinex686m to [facility]+ plenty of water daily... ~  10/14: CXR showed normal heart size w/ tortuous atherosclerotic Ao, clear lungs w/ ?vague opac left mid zone adjacent to hilum (nothing seen in this area on CT 3/14) ?overlying shadows & we will f/u...  ~  CXR 04/04/14 shows stable heart size, atherosclerotic & tortuous Ao, mild hyperinflation, clear lungs w/o acute changes, DJD Tspine... ~  8/15 & 11/15> she remains stable from the pulmonary standpoint on Mucinex600Qid + Fluids; rec to follow better antireflux regimen for her cough, LPR, choking...  ~  CXR 03/22/15 showed stable cardiomeg,  chronic changes, old right rib fxs, NAD ~  She went to the ER 05/17/15 w/ [name] complaints and the ER physician ordered a CT Abd & Pelvis> a few tiny 3-443mnodules were noted in the lateral aspect of the RML, the abd revealed aortoiliac calcif w/o aneurysm, right renal cyst, otherw no signif abn noted (Radiology rec f/u CT Chest in 1y87yrshe is very concerned and I indicated that I thought this was prob some scar tissue but we would recheck her CXR in 9mo62moollow up the CT in 37yr.30yr Heather SalvageESTABLISHED Primary Care w/ , , , DrMcMullen & DrWinfrey @ [facility]>   HYPERTENSION (ICD-401.9) -  ~  on AMLODIPINE 2.5mg/d40mCOREG 3.125mgBi21mLASIX 40mg- ?72mng 1/2tab/d or just using it prn swelling... ~  8/16: her meds have been adjusted> on Coreg3.125Bid, Lasix40, off Amlod; BP= 106/60 & she persists w/ mult somatic complaints; last 2DEcho 10/2009 showed norm LV size & func w/ EF=55-60%, mildAI.  Hx of CHEST PAIN (ICD-786.50) - she takes ASA 81mg/d +38menol Prn... followed by Heather [name] for Cards. ~  Baseline EKG w/ RBBB & LAD, no acute changes... ~  NuclearStressTest done 2007 & 2009 showed normal- without scar or ischemia, EF=67%...   ~  2DEcho 7/08 showed mild AI, mild diastolic dysfunction, EF=55-60%UV=25-36%bd 1/09 & 9/12 showed incidental coronary calcif, & aorto-iliac atherosclerotic changes as well... ~  8/13: she saw DrNishan> HBP, DD, atypCP w/ neg Myoviews & no changes made... ~  10/13:  She uses a back brace/ abd binder to help her CWP... ~  EKG 9/15 showed NSR, rate81, LAD, RBBB, NSSTTWA, NAD...  VENOUS INSUFFICIENCY (ICD-459.81) - she has mod VI and follows a low sodium diet, elevates legs when able, and wears support hose. ~  7/12:  C/o incr edema & we reviewed a 2gm sodium diet & increased her Lasix from 20mg/d to57mg/d... 9m/13:  She has persist complaints but mild chr VI w/ min edema & reminded to elim sodium, elev, support hose, take the Lasix... ~ 11/13: she  went to the ER w/ edema; VDopplers were neg for DVT; they reiterated the recs for no salt, elevation, support hose, & continue Lasix40. ~  She has persistent edema in LEs and rec to avoid sodium, elev legs, wear support hose...  HIATAL HERNIA (ICD-553.3) & GASTRITIS (ICD-535.50) >>  ~  on OMEPRAZOLE 20mg/d & fo14med by Heather [name] for [name] having fired Heather [name] & [facility] in past. ~  last EGD was 2/08 w/ 3cm HH & gastitis... ~  CT Abd 1/09 revealed calcif gallstones, & mild ectasia of AA... ~  CT Abd 1/11 showed left colon divertics, tiny hepatic lesions w/o change & right renal cyst stable. ~  CT Abd 9/12 showed divertics (no inflamm), right renal & left hep  lobe cysts, atherosclerotic changes, NAD... ~  Seen by Heather [name] 4/13> c/o dysphagia & congestion; he notes mult [name] complaints, known esoph dysmotility w/o stricture, hard to pin down her complaints... ~  She has followed up w/ Heather [name] regarding her concerns about swelling in her abd... ~  CT Abd Pelvis 3/14 showed Tiny cysts in the lateral segment left hepatic lobe, 1.4 cm right upper pole renal cyst, atherosclerotic calcifications of the abdominal aorta and branch vessels, DJD spine... ~  2015: now on Protonix40Bid but not taking regularly & advised BID dosing & antireflux regimen for her LPR...   DIVERTICULOSIS, COLON (ICD-562.10) - followed regularly by Heather [name]. ~  colonoscopy 7/03 by Heather [name] showing divertics only... we discussed Miralax + Senakot-S for constipation. ~  colonoscopy 2/11 showed divertics, 40m polyp, hems...  ? of ABDOMINAL PAIN & SYMPTOMATOLOGY - she had a very thorough work up in 2009> she has a cancer phobia and is very difficult to reassure her that everything appropriate has been done & no cancer found... Mult additional [name] evals since then from [name], [name] (Heather [name]), [name] (DrThorne)... ~  eval by [name]- Heather [name] 12/08 but pt wasn't satisfied w/ his examination... ~  full labs 1/09 were WNL, sed=20... ~  CT Abd/  Pelvis 1/09= no acute abn in abd (calcif gallstones & mild ectasia of AA), calcif fibroid in the pelvis, & ?regarding the left ovary. ~  referral to DrFontaine, GYN> his notes are reviewed... ~  follow up w/ DrKimbrough, Urology> his notes are reviewed... ~  eval Heather [name] for [facility]opedics[facility]. ~  repeat [name] eval 1/11 by Heather [name] w/ CT & colonoscpy as above- no acute problems. ~  5/11 & 7/11 >> persist complaints w/ neg recheck by Heather [name] & DrKimbrough... ~  12/11:  routine GYN surveillance DrFontaine found anteverted uterus w/ fibroids, atrophic ovaries bilat, no mass. ~  Repeat [name] eval 4/12 by Heather [name]> he tried to reassure the pt, felt some symptoms might be from poor abd wall muscle tone, rec continue Omep, laxatives... ~  6/12:  She sought another [name] opinion Heather [name]> he did not feel she needed any additional testing... ~  She tells me she has yet another appt to see a gastroenterologist at [facility]==> seen by DrThorne 8/12 & note reviewed... ~  EGD by DrThorne 9/12 w/ mult whitish nodular plaques in cardia & fundus> Bx results pending & she is very concerned (remember her hx cancer phobia). ~  [facility]... ~  [facility]/ [name] [place] [name] [place] [name]Marland Kitchen  ~  CT Abd & Pelvis 05/17/15 via ER showed a few tiny 3-436mnodules were noted in the lateral aspect of the RML, the abd revealed aortoiliac calcif w/o aneurysm, right renal cyst, otherw no signif abn noted (Radiology rec f/u CT Chest in 1y1yr RENAL CYST (ICD-593.2) - she has a 1 cm right renal cyst seen on CT in 2007 and followed by DrKimbrough... f/u CT Abd 1/09 - no change in the cyst... f/u renal ultrasound 1/10 w/ small bilat cysts... f/u CT Abd 1/11- no change in cyst. ~  she saw DrKimbrough 7/11 for LBP, microhematuria, urge incontinence- stable, no additional Rx. ~  she saw DrKimbrough 1/12 for urge  incontinence, freq, nocturia> rec Desmopressin 0.2mg63m/2 tab Qhs. ~  No change in the renal cyst over the yrs...Marland KitchenMarland Kitchen  CARCINOMA, BREAST, LEFT (ICD-174.9) - Surgery 9/94 by DrBlievernicht w/ Left modified radical mastectomy... +estrogen receptors and Rx w/ Tamoxifen... f/u mammograms all neg... breast exam on right has been neg- no nodules palpated... She says that DrBertrand insisted that her PCP check her right breast at each & every office visit to be sure she doesn't develop any nodules...  DEGENERATIVE JOINT DISEASE (ICD-715.90) - on OTC meds + VICODIN Prn... she has end-stage OA of Right Knee per Heather [name] treated w/ shots in the past... she has a knee brace and a cane for ambulation... ~  8/13:  She wants a better pain pill & we discussed change to [place] prn... ~  12/13: she tells me that Heather [name] injected her knee last wk- improved...  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 Rx w/ rest, heat, Percocet, and f/u w/ Ortho...  Hx of LOW BACK PAIN, CHRONIC (ICD-724.2) - eval Heather [name] w/ rec for shots by Heather [name] & she's feeling sl better & wears back brace... ~  5/10: mult somatic complaints- primarily c/o left side/ postero-lat rib pain...  Exam w/ tender left lat/ post ribs... Bone Scan= neg... ~  MRI Lumbar spine 7/10 by Heather [name]... ~  Epidural steroid shot by Heather [name] 8/10 & 1/11 ~  Repeat LBP eval by Heather [name] w/ another MRI lumbar spine showing degenerative changes w/ mild progression since 7/10 MRI, mild-mod sp stenosis & lat recess stenosis at several levels ~  Heather [name] referred her to [facility] to consider more shots but she is not so inclined... ~  6/13: she saw DrTooke w/ lumbar spondylosis; he felt that he had nothing to offer her & rec incr Tramadol to Qid... ~  2014:  She has had numerous visits w/ Heather [name]/ Ernestina Patches for injections, XRays, scans etc...  CARPAL TUNNEL SYNDROME,  BILATERAL (ICD-354.0) - she had prev right carpal tunnel release by Heather [name] and was c/o increasing discomfort in her left wrist despite wrist splint Qhs- had left carpal tunnel release 1/10 by Heather [name]...  ANXIETY (ICD-300.00) - on ALPRAZOLAM 0.28m Tid & encouraged to take it regularly... she has a cancer phobia & it is very difficult to reassure her regarding her symptoms and our evaluations... ~  7/13: she went to the ER c/o swelling of the tongue but eval by EDP indicated that her tongue was normal & not swollen, she was not on an ACE, given Pred Rx anyway. ~  She has been asked to incr the ALprazolam to regular dosing but she is reluctant...   Past Surgical History:  Procedure Laterality Date  . CARPAL TUNNEL RELEASE    . Left mastectomy      Outpatient Encounter Medications as of 12/07/2017  Medication Sig  . acetaminophen-codeine (TYLENOL #2) 300-15 MG tablet Take 1 tablet by mouth every 8 (eight) hours.  . ALPRAZolam (XANAX) 0.25 MG tablet Take 1 tablet (0.25 mg total) by mouth daily as needed for anxiety. Do not put in child proof bottles as patient has severe hand arthritis  . carvedilol (COREG) 3.125 MG tablet Take 1 tablet (3.125 mg total) by mouth 2 (two) times daily.  . Cholecalciferol (VITAMIN D3) 1000 units CAPS Take 1 capsule (1,000 Units total) by mouth daily. For your bones  . feeding supplement, ENSURE ENLIVE, (ENSURE ENLIVE) LIQD Take 237 mLs by mouth 2 (two) times daily between meals.  . hydroxypropyl methylcellulose / hypromellose (ISOPTO TEARS / GONIOVISC) 2.5 % ophthalmic solution Place 1 drop into both  eyes 3 (three) times daily as needed for dry eyes.  Marland Kitchen levocetirizine (XYZAL) 5 MG tablet Take 1 tablet (5 mg total) by mouth every evening.  . mirabegron ER (MYRBETRIQ) 50 MG TB24 tablet Take 50 mg by mouth daily.  . Multiple Vitamins-Minerals (MULTIVITAMINS THER. W/MINERALS) TABS Take 1 tablet by mouth daily.  . naproxen (NAPROSYN) 500 MG tablet Take 1 tablet  (500 mg total) 3 (three) times daily with meals as needed by mouth for moderate pain.  . pantoprazole (PROTONIX) 40 MG tablet Take 1 tablet (40 mg total) by mouth 2 (two) times daily.  . polyethylene glycol powder (GLYCOLAX/MIRALAX) powder Take 17 g by mouth daily as needed. (Patient taking differently: Take 17 g by mouth daily as needed for moderate constipation. )  . spironolactone (ALDACTONE) 25 MG tablet Take 1 tablet (25 mg total) by mouth daily.  . traMADol (ULTRAM) 50 MG tablet Take 1 tablet (50 mg total) by mouth every 8 (eight) hours as needed for moderate pain.  . [DISCONTINUED] amoxicillin-clavulanate (AUGMENTIN) 875-125 MG tablet Take 1 tablet by mouth every 12 (twelve) hours.  . [DISCONTINUED] diclofenac sodium (VOLTAREN) 1 % GEL Apply 4 g topically 4 (four) times daily.  . furosemide (LASIX) 20 MG tablet Take 1 tablet (20 mg total) by mouth daily. (Patient not taking: Reported on 12/07/2017)   No facility-administered encounter medications on file as of 12/07/2017.     Allergies  Allergen Reactions  . Fentanyl Other (See Comments)    REACTION: nausea - pt states that she is not allergic  REACTION: nausea - pt states that she is not allergic     Current Medications, Allergies, Past Medical History, Past Surgical History, Family History, and Social History were reviewed in [employer] record.    Review of Systems       See HPI - all other systems neg except as noted... She has mult somatic complaints and anxiety. The patient complains of decreased hearing, chest discomfort, dyspnea on exertion, abdominal pain, incontinence, muscle weakness, and difficulty walking.  The patient denies anorexia, fever, vision loss, hoarseness, syncope, peripheral edema, prolonged cough, headaches, hemoptysis, melena, hematochezia, severe indigestion/heartburn, hematuria, transient blindness, depression, abnormal bleeding, enlarged lymph nodes, and angioedema.     Objective:    Physical Exam    WD, WN, 84 y/o BF in NAD... she is very anxious... GENERAL:  Alert & oriented; pleasant & cooperative... HEENT:  [place]/AT, EOM-wnl, PERRLA, EACs-clear, TMs-wnl, NOSE-clear, THROAT-clear & wnl, no lesions seen. NECK:  Neck ROM decreased, no JVD; normal carotid impulses w/o bruits; no thyromegaly or nodules palpated; no lymphadenopathy. CHEST:  Clear to P & A; without wheezes/ rales/ or rhonchi heard... BREAST:  s/p left mastectomy... right breast normal- without nodules palpated... HEART:  Regular Rhythm; without murmurs/ rubs/ or gallops detected... ABDOMEN:  Soft & nontender; normal bowel sounds; no organomegaly or masses palpated... EXT: without deformities, mod arthritic changes; no varicose veins/ +venous insuffic/ tr edema. NEURO:  CN's intact; no focal neuro deficits... DERM:  Small sl excoriated area of dermatitis left shin- no drainage,no rash, etc...  RADIOLOGY DATA:  Reviewed in the EPIC EMR & discussed w/ the patient...  LABORATORY DATA:  Reviewed in the EPIC EMR & discussed w/ the patient...   Assessment & Plan:    RESPIRATORY SYMPTOMS w/ Dyspnea & throat clearing, prob LPR>  She had a neg pulm eval 2011 and numerous times thereafter; she insisted on additional eval 9/13 for her chr throat clearing symptom; CXR w/o  acute changes & PFTs normal for her 84 y/o lungs; she is quite insistent on med rx for her problem but has not had relief from anything; REC to take [id number], Fluids, DELSYM prn; pt very anxious & Alprazolam seems to help when she takes it... 11/15> she has been taking the OTC MUCINEX '600mg'$  Qid w/ fluids and seems sl improved; rec strict antireflux regimen w/ Protonix before dinner, NPO after dinner, elev HOB 6" blocks... 5/16 - 8/16> she is once again off all these meds and c/o incr symptoms; rec to restart the Mucinex, fluids, and antireflux regimen... 01/17/16>   Zahari is stable from the pulm standpoint;  She has mult somatic complaints & a  large physician contingent attending to her various symptoms;  I suggested to her that she would be best served by taking the Xanax regularly 0.'5mg'$ - 1/2 to 1 tab Tid regularly (she never did). 05/28/16>   Maleigh appears stable- chest is clear, oxygenation excellent, and she presents w/ the same mult somatic complaints; she still won't try the Xanax as I've suggested; f/u w/ me as needed... 12/01/16>   Nayzeth is stable from there pulmonary standpoint; encouraged to use Mucinex & drink plenty of water; needs to be careful & avoid falling! 06/01/17>   Rise remains stable from the pulmonary standpoint, her CC remains her enigmatic left sided abd discomfort & "knot"; I hav erec that she continue her Mucinex '600mg'$  Qid w/ fluids; pulmonary follow up can be prn... 12/07/17>   Heather [name] is 84 y/o and seems to be doing very well overall except for her myriad complaints & concerns; I've always thought that she has a cancer phobia but she is impossible to reassure;  Stable from the pulmonary perspective & has not required antibiotics, inhalers, Pred, Oxygen, etc...  Follow up w/ [place] can be prn for breathing problems...    Abn CT Abd w/ several tiny nodules reported in the RML area>  We discussed this & decided to f/u CXR in 26mo& CT Chest in 165yr.   Hx CP> evaluated for Cards by DrBensimon & he has not been in favor of invasive testing; stable on conservative med rx...  [name]> HH, Gastritis, Divertics, vague abd pain>  I tried once again to reassure her about the thorough [name] evaluations that her has undergone; she is happy w/ DrThorne's eval & will f/u w/ her & in the meanwhile continue rx w/ Prilosec40, Miralax, Metamucil, Senakot, Mylicon, etc... She has re-established w/ [place] [name]- their notes reviewed...  Hx left Breast Cancer>  No known recurrence...  Anxiety>  Asked once again to increase the Alprazolam to 1/2 - 1 tab TID regularly...   Patient's Medications  New Prescriptions   No medications on  file  Previous Medications   ACETAMINOPHEN-CODEINE (TYLENOL #2) 300-15 MG TABLET    Take 1 tablet by mouth every 8 (eight) hours.   ALPRAZOLAM (XANAX) 0.25 MG TABLET    Take 1 tablet (0.25 mg total) by mouth daily as needed for anxiety. Do not put in child proof bottles as patient has severe hand arthritis   CARVEDILOL (COREG) 3.125 MG TABLET    Take 1 tablet (3.125 mg total) by mouth 2 (two) times daily.   CHOLECALCIFEROL (VITAMIN D3) 1000 UNITS CAPS    Take 1 capsule (1,000 Units total) by mouth daily. For your bones   FEEDING SUPPLEMENT, ENSURE ENLIVE, (ENSURE ENLIVE) LIQD    Take 237 mLs by mouth 2 (two) times daily between meals.   FUROSEMIDE (LASIX)  20 MG TABLET    Take 1 tablet (20 mg total) by mouth daily.   HYDROXYPROPYL METHYLCELLULOSE / HYPROMELLOSE (ISOPTO TEARS / GONIOVISC) 2.5 % OPHTHALMIC SOLUTION    Place 1 drop into both eyes 3 (three) times daily as needed for dry eyes.   LEVOCETIRIZINE (XYZAL) 5 MG TABLET    Take 1 tablet (5 mg total) by mouth every evening.   MIRABEGRON ER (MYRBETRIQ) 50 MG TB24 TABLET    Take 50 mg by mouth daily.   MULTIPLE VITAMINS-MINERALS (MULTIVITAMINS THER. W/MINERALS) TABS    Take 1 tablet by mouth daily.   NAPROXEN (NAPROSYN) 500 MG TABLET    Take 1 tablet (500 mg total) 3 (three) times daily with meals as needed by mouth for moderate pain.   PANTOPRAZOLE (PROTONIX) 40 MG TABLET    Take 1 tablet (40 mg total) by mouth 2 (two) times daily.   POLYETHYLENE GLYCOL POWDER (GLYCOLAX/MIRALAX) POWDER    Take 17 g by mouth daily as needed.   SPIRONOLACTONE (ALDACTONE) 25 MG TABLET    Take 1 tablet (25 mg total) by mouth daily.   TRAMADOL (ULTRAM) 50 MG TABLET    Take 1 tablet (50 mg total) by mouth every 8 (eight) hours as needed for moderate pain.  Modified Medications   No medications on file  Discontinued Medications   AMOXICILLIN-CLAVULANATE (AUGMENTIN) 875-125 MG TABLET    Take 1 tablet by mouth every 12 (twelve) hours.   DICLOFENAC SODIUM (VOLTAREN) 1 %  GEL    Apply 4 g topically 4 (four) times daily.

## 2017-12-09 ENCOUNTER — Other Ambulatory Visit: Payer: Self-pay | Admitting: Family Medicine

## 2017-12-09 DIAGNOSIS — F411 Generalized anxiety disorder: Secondary | ICD-10-CM

## 2017-12-11 DIAGNOSIS — Z961 Presence of intraocular lens: Secondary | ICD-10-CM | POA: Diagnosis not present

## 2017-12-11 DIAGNOSIS — H1851 Endothelial corneal dystrophy: Secondary | ICD-10-CM | POA: Diagnosis not present

## 2017-12-11 DIAGNOSIS — H40013 Open angle with borderline findings, low risk, bilateral: Secondary | ICD-10-CM | POA: Diagnosis not present

## 2017-12-11 DIAGNOSIS — H02051 Trichiasis without entropian right upper eyelid: Secondary | ICD-10-CM | POA: Diagnosis not present

## 2017-12-13 ENCOUNTER — Encounter (HOSPITAL_COMMUNITY): Payer: Self-pay | Admitting: Emergency Medicine

## 2017-12-13 ENCOUNTER — Emergency Department (HOSPITAL_COMMUNITY): Payer: Medicare Other

## 2017-12-13 ENCOUNTER — Emergency Department (HOSPITAL_COMMUNITY)
Admission: EM | Admit: 2017-12-13 | Discharge: 2017-12-14 | Disposition: A | Discharge status: Home / Self Care | Payer: Medicare Other | Attending: Emergency Medicine | Admitting: Emergency Medicine

## 2017-12-13 DIAGNOSIS — K5900 Constipation, unspecified: Secondary | ICD-10-CM | POA: Diagnosis not present

## 2017-12-13 DIAGNOSIS — G8929 Other chronic pain: Secondary | ICD-10-CM | POA: Diagnosis not present

## 2017-12-13 DIAGNOSIS — Z79899 Other long term (current) drug therapy: Secondary | ICD-10-CM | POA: Insufficient documentation

## 2017-12-13 DIAGNOSIS — R1032 Left lower quadrant pain: Secondary | ICD-10-CM | POA: Diagnosis not present

## 2017-12-13 DIAGNOSIS — I10 Essential (primary) hypertension: Secondary | ICD-10-CM | POA: Diagnosis not present

## 2017-12-13 DIAGNOSIS — Z853 Personal history of malignant neoplasm of breast: Secondary | ICD-10-CM | POA: Insufficient documentation

## 2017-12-13 LAB — COMPREHENSIVE METABOLIC PANEL
ALT: 23 U/L (ref 14–54)
AST: 28 U/L (ref 15–41)
Albumin: 3.7 g/dL (ref 3.5–5.0)
Alkaline Phosphatase: 40 U/L (ref 38–126)
Anion gap: 8 (ref 5–15)
BUN: 20 mg/dL (ref 6–20)
CO2: 26 mmol/L (ref 22–32)
Calcium: 9.3 mg/dL (ref 8.9–10.3)
Chloride: 106 mmol/L (ref 101–111)
Creatinine, Ser: 1 mg/dL (ref 0.44–1.00)
GFR calc Af Amer: 53 mL/min — ABNORMAL LOW (ref >60)
GFR calc non Af Amer: 46 mL/min — ABNORMAL LOW (ref >60)
Glucose, Bld: 96 mg/dL (ref 65–99)
Potassium: 4.6 mmol/L (ref 3.5–5.1)
Sodium: 140 mmol/L (ref 135–145)
Total Bilirubin: 0.7 mg/dL (ref 0.3–1.2)
Total Protein: 6.6 g/dL (ref 6.5–8.1)

## 2017-12-13 LAB — LIPASE, BLOOD: Lipase: 32 U/L (ref 11–51)

## 2017-12-13 LAB — URINALYSIS, ROUTINE W REFLEX MICROSCOPIC
Bacteria, UA: NONE SEEN
Bilirubin Urine: NEGATIVE
Glucose, UA: NEGATIVE mg/dL
Ketones, ur: NEGATIVE mg/dL
Leukocytes, UA: NEGATIVE
Nitrite: NEGATIVE
Protein, ur: NEGATIVE mg/dL
Specific Gravity, Urine: 1.005 (ref 1.005–1.030)
Squamous Epithelial / LPF: NONE SEEN
pH: 7 (ref 5.0–8.0)

## 2017-12-13 LAB — CBC
HCT: 41.2 % (ref 36.0–46.0)
Hemoglobin: 13.6 g/dL (ref 12.0–15.0)
MCH: 30.8 pg (ref 26.0–34.0)
MCHC: 33 g/dL (ref 30.0–36.0)
MCV: 93.4 fL (ref 78.0–100.0)
Platelets: 212 10*3/uL (ref 150–400)
RBC: 4.41 MIL/uL (ref 3.87–5.11)
RDW: 14 % (ref 11.5–15.5)
WBC: 6.4 10*3/uL (ref 4.0–10.5)

## 2017-12-13 MED ORDER — IOPAMIDOL (ISOVUE-370) INJECTION 76%
INTRAVENOUS | Status: AC
  Administered 2017-12-13: 80 mL
  Filled 2017-12-13: qty 100

## 2017-12-13 MED ORDER — ALOSETRON HCL 0.5 MG PO TABS: 0.5000 mg | ORAL_TABLET | Freq: Two times a day (BID) | ORAL | 0 refills | Status: DC

## 2017-12-13 NOTE — ED Triage Notes (Signed)
Patient here from home with complaints of abdominal pain "for a long time". Gradually has gotten worse. Normal BM. Denies n/v.

## 2017-12-13 NOTE — Discharge Instructions (Signed)

## 2017-12-13 NOTE — ED Notes (Signed)
Pt used restroom but voided around hat so we were unable to collect UA at this time.

## 2017-12-13 NOTE — ED Provider Notes (Signed)
[facility] DEPT Provider Note   CSN: [id number] Arrival date & time: 12/13/17  1539     History   Chief Complaint Chief Complaint  Patient presents with  . Abdominal Pain    HPI Heather [name] is a 84 y.o. female who presents the emergency department chief complaint of abdominal pain.  She states that the pain has been present in her left lower quadrant of her abdomen wrapping around her back for about 9 months.  She has been seen by her primary care physician.  She was seen at an urgent care on the 12th and treated for diverticulitis however patient states she saw her primary care physician about 2 days later stop the antibiotics as she has had the same pain for about 8 months.  The patient states that it is worse with standing, improved with rest however the pain is constant.  She states that at times her pain is so bad it takes her breath away.  She has associated large volume urination which is currently being worked up by her urologist she denies weakness in her lower extremities, fevers, chills.  She states that she is constantly hungry however she has been losing weight she denies pain with urination.  HPI  Past Medical History:  Diagnosis Date  . Allergic rhinitis, cause unspecified 02/21/2014  . Anxiety state, unspecified   . Breast cancer ([place])    NO STICK OR BLOOD PRESSURE CHECKS IN LEFT ARM  . Carpal tunnel syndrome   . Cervical spondylosis   . Colon polyp 12/19/2009   Transverse-polypoid colorectal mucosa  . Constipation   . Degenerative joint disease   . Diastolic dysfunction   . Diverticula of colon   . Gastritis, chronic   . Hiatal hernia   . Hypertension   . Low back pain   . RBBB (right bundle branch block with left anterior fascicular block)   . Renal cyst   . Venous insufficiency     Patient Active Problem List   Diagnosis Date Noted  . Urinary incontinence without sensory awareness 12/02/2017  . Arthritis of left hip  10/20/2017  . Functional abdominal pain syndrome 09/02/2017  . Encounter for chronic pain management 07/22/2017  . Dementia 06/25/2017  . Vitamin D deficiency 05/21/2017  . Abnormal CT scan of lung 07/16/2015  . Esophageal dysmotility 10/27/2014  . Failure to thrive in adult 04/26/2014  . Arthritis of hand, degenerative 04/10/2014  . Other and unspecified hyperlipidemia 04/10/2014  . LPRD (laryngopharyngeal reflux disease) 04/04/2014  . Chronic bronchitis ([place]) 05/30/2013  . Hx of breast cancer 08/30/2012  . Edema 06/12/2011  . GERD 03/27/2010  . Diastolic CHF ([place]) 16/96/7893  . Constipation 01/18/2009  . Carpal tunnel syndrome on both sides 10/03/2008  . Cervical spondylosis without myelopathy 03/21/2008  . Diaphragmatic hernia 12/26/2007  . Renal cyst 12/26/2007  . Primary hypertension 09/15/2007  . Venous (peripheral) insufficiency 09/15/2007  . Osteoarthritis 09/15/2007  . Lumbago 09/15/2007  . Diverticulosis of colon 05/24/2002    Past Surgical History:  Procedure Laterality Date  . CARPAL TUNNEL RELEASE    . Left mastectomy      OB History    No data available       Home Medications    Prior to Admission medications   Medication Sig Start Date End Date Taking? Authorizing Provider  acetaminophen-codeine (TYLENOL #2) 300-15 MG tablet Take 1 tablet by mouth every 8 (eight) hours. 09/16/17   [place] Bing, DO  ALPRAZolam (  XANAX) 0.25 MG tablet TAKE 1 TABLET BY MOUTH ONCE DAILY FOR ANXIETY **DO  NOT  PUT  IN  CHILD  PROOF  BOTTLE  AS  PATIENT  HAS  SEVERE  HAND  ARTHRITIS** 12/09/17   Winfrey, Alcario Drought, MD  carvedilol (COREG) 3.125 MG tablet Take 1 tablet (3.125 mg total) by mouth 2 (two) times daily. 09/03/17   [place] Bing, DO  Cholecalciferol (VITAMIN D3) 1000 units CAPS Take 1 capsule (1,000 Units total) by mouth daily. For your bones 05/21/17   Donnamae Jude, MD  feeding supplement, ENSURE ENLIVE, (ENSURE ENLIVE) LIQD Take 237 mLs by mouth 2 (two) times  daily between meals. 09/30/16   Nche, Charlene Brooke, NP  furosemide (LASIX) 20 MG tablet Take 1 tablet (20 mg total) by mouth daily. Patient not taking: Reported on 12/07/2017 05/21/17   Donnamae Jude, MD  hydroxypropyl methylcellulose / hypromellose (ISOPTO TEARS / GONIOVISC) 2.5 % ophthalmic solution Place 1 drop into both eyes 3 (three) times daily as needed for dry eyes.    [provider]  levocetirizine (XYZAL) 5 MG tablet Take 1 tablet (5 mg total) by mouth every evening. 05/21/17   Donnamae Jude, MD  mirabegron ER (MYRBETRIQ) 50 MG TB24 tablet Take 50 mg by mouth daily.    [provider]  Multiple Vitamins-Minerals (MULTIVITAMINS THER. W/MINERALS) TABS Take 1 tablet by mouth daily.    [provider]  naproxen (NAPROSYN) 500 MG tablet Take 1 tablet (500 mg total) 3 (three) times daily with meals as needed by mouth for moderate pain. 10/02/17   [name], MD  pantoprazole (PROTONIX) 40 MG tablet Take 1 tablet (40 mg total) by mouth 2 (two) times daily. 09/03/17   [place] Bing, DO  polyethylene glycol powder (GLYCOLAX/MIRALAX) powder Take 17 g by mouth daily as needed. Patient taking differently: Take 17 g by mouth daily as needed for moderate constipation.  05/21/17   Donnamae Jude, MD  spironolactone (ALDACTONE) 25 MG tablet Take 1 tablet (25 mg total) by mouth daily. 05/21/17   Donnamae Jude, MD  traMADol (ULTRAM) 50 MG tablet Take 1 tablet (50 mg total) by mouth every 8 (eight) hours as needed for moderate pain. 12/01/17   [name], MD    Family History Family History  Problem Relation Age of Onset  . Colon cancer Neg Hx     Social History Social History   Tobacco Use  . Smoking status: Never Smoker  . Smokeless tobacco: Never Used  Substance Use Topics  . Alcohol use: No    Alcohol/week: 0.0 oz  . Drug use: No     Allergies   Fentanyl   Review of Systems Review of Systems  Ten systems reviewed and are negative for acute  change, except as noted in the HPI.   Physical Exam Updated Vital Signs BP (!) 115/58 (BP Location: Right Arm)   Pulse 72   Temp 98.7 F (37.1 C) (Oral)   Resp 16   SpO2 99%   Physical Exam  Physical Exam  Nursing note and vitals reviewed. Constitutional: She is oriented to person, place, and time. She appears well-developed and well-nourished. No distress.  HENT:  Head: Normocephalic and atraumatic.  Eyes: Conjunctivae normal and EOM are normal. Pupils are equal, round, and reactive to light. No scleral icterus.  Neck: Normal range of motion.  Cardiovascular: Normal rate, regular rhythm and normal heart sounds.  Exam reveals no gallop and no  friction rub.   No murmur heard. Pulmonary/Chest: Effort normal and breath sounds normal. No respiratory distress.  Abdominal: Soft. Bowel sounds are normal. She exhibits no distension and no mass.  Left lower quadrant abdominal pain, no pain with hip flexion and the psoas muscle, no midline lumbar tenderness.. There is no guarding.  Neurological: She is alert and oriented to person, place, and time.  Skin: Skin is warm and dry. She is not diaphoretic.    ED Treatments / Results  Labs (all labs ordered are listed, but only abnormal results are displayed) Labs Reviewed  COMPREHENSIVE METABOLIC PANEL - Abnormal; Notable for the following components:      Result Value   GFR calc non Af Amer 46 (*)    GFR calc Af Amer 53 (*)    All other components within normal limits  LIPASE, BLOOD  CBC  URINALYSIS, ROUTINE W REFLEX MICROSCOPIC    EKG  EKG Interpretation None       Radiology No results found.  Procedures Procedures (including critical care time)  Medications Ordered in ED Medications - No data to display   Initial Impression / Assessment and Plan / ED Course  I have reviewed the triage vital signs and the nursing notes.  Pertinent labs & imaging results that were available during my care of the patient were reviewed by  me and considered in my medical decision making (see chart for details).     Patient CT reviewed and shows some atherosclerotic changes at the mesenteric artery however there is significantly good flow in that region and is unlikely the cause of her abdominal pain.  No other evidence of significant abnormality.  Patient will be given Lotronex antispasmodic.  She may take Tylenol and should follow-up with gastroenterology for any further evaluation.  He is afebrile and hemodynamically stable.  Final Clinical Impressions(s) / ED Diagnoses   Final diagnoses:  Left lower quadrant pain    ED Discharge Orders        Ordered    alosetron (LOTRONEX) 0.5 MG tablet  2 times daily     12/13/17 2325       Aida, Lemaire, PA-C 12/13/17 2348    Virgel Manifold, MD 12/13/17 2356

## 2017-12-16 NOTE — Progress Notes (Signed)
Cardiology Office Note    Date:  12/18/2017   ID:  Heather [name], DOB Nov 13, 1918, MRN [id number]  PCP:  Heather Alu, MD  Cardiologist:  Dr. Johnsie Cancel     History of Present Illness: Heather [name] is a 84 y.o. female w/ a hx of chronic diastolic CHF, HTN, atypical CP s/p normal myoview in 2007 and 2009, breast CA s/p L mastectomy, chronic phlegm/cough, chronic edema, anxiety, depression, diverticulosis/gastritis/functional abdominal pain who is added onto my [facility] clinic schedule for evaluation of labile BPs.   Review of chart reveals that she constantly complains of multiple somatic complaints and always feels "terrible" w/ mult GI issues and respiratory issues with chronic cough and phlem. She is unhappy w/ PCP and has switched multiple times and see multiple providers concurrently. She sees specialists including pulmonary, GI, ortho and renal. She has multiple abnormalities noted on imaging that are being followed by various specialties. Please see Dr. Jeannine [name] note from 07/16/15 to see a full list of her problems. He states that she is doing "remarklabley well for a 84 yo". He urges her to take her Xanax more frequently.  BP can be labile Norvasc has caused edema in past.  Worsening dementia which does not help situation BP has been fine and LE edema stable   Studies:  - Echo (2010): EF 55-60%, mild AR.  - Nuclear (2009):  Normal per Dr. Johnsie Cancel note (dont see report)    Recent Labs/Images:  Recent Labs    12/13/17 1609  NA 140  K 4.6  BUN 20  CREATININE 1.00  ALT 23  HGB 13.6     Dg Lumbar Spine 2-3 Views  08/21/2015   CLINICAL DATA:  Back pain radiating down left leg.  EXAM: LUMBAR SPINE - 2-3 VIEW  COMPARISON:  None.  FINDINGS: Paraspinal soft tissues normal. Degenerative changes lumbar spine and both hips. Mild scoliosis. No acute bony abnormality identified.  IMPRESSION: Severe multilevel degenerative changes lumbar spine. Mild scoliosis.   Electronically  Signed   By: Marcello Moores  Register   On: 08/21/2015 15:43     Wt Readings from Last 3 Encounters:  12/18/17 117 lb 3.2 oz (53.2 kg)  12/07/17 117 lb 6.4 oz (53.3 kg)  12/01/17 117 lb (53.1 kg)     Past Medical History:  Diagnosis Date  . Allergic rhinitis, cause unspecified 02/21/2014  . Anxiety state, unspecified   . Breast cancer ([place])    NO STICK OR BLOOD PRESSURE CHECKS IN LEFT ARM  . Carpal tunnel syndrome   . Cervical spondylosis   . Colon polyp 12/19/2009   Transverse-polypoid colorectal mucosa  . Constipation   . Degenerative joint disease   . Diastolic dysfunction   . Diverticula of colon   . Gastritis, chronic   . Hiatal hernia   . Hypertension   . Low back pain   . RBBB (right bundle branch block with left anterior fascicular block)   . Renal cyst   . Venous insufficiency     Current Outpatient Medications  Medication Sig Dispense Refill  . acetaminophen-codeine (TYLENOL #2) 300-15 MG tablet Take 1 tablet by mouth every 8 (eight) hours. 40 tablet 0  . alosetron (LOTRONEX) 0.5 MG tablet Take 1 tablet (0.5 mg total) by mouth 2 (two) times daily. 10 tablet 0  . ALPRAZolam (XANAX) 0.25 MG tablet TAKE 1 TABLET BY MOUTH ONCE DAILY FOR ANXIETY **DO  NOT  PUT  IN  CHILD  PROOF  BOTTLE  AS  PATIENT  HAS  SEVERE  HAND  ARTHRITIS** 30 tablet 1  . carvedilol (COREG) 3.125 MG tablet Take 1 tablet (3.125 mg total) by mouth 2 (two) times daily. 180 tablet 3  . Cholecalciferol (VITAMIN D3) 1000 units CAPS Take 1 capsule (1,000 Units total) by mouth daily. For your bones 60 capsule 3  . feeding supplement, ENSURE ENLIVE, (ENSURE ENLIVE) LIQD Take 237 mLs by mouth 2 (two) times daily between meals. 237 mL 12  . furosemide (LASIX) 20 MG tablet Take 1 tablet (20 mg total) by mouth daily. 30 tablet 3  . hydroxypropyl methylcellulose / hypromellose (ISOPTO TEARS / GONIOVISC) 2.5 % ophthalmic solution Place 1 drop into both eyes 3 (three) times daily as needed for dry eyes.    Marland Kitchen levocetirizine  (XYZAL) 5 MG tablet Take 1 tablet (5 mg total) by mouth every evening. 60 tablet 2  . mirabegron ER (MYRBETRIQ) 50 MG TB24 tablet Take 50 mg by mouth daily.    . Multiple Vitamins-Minerals (MULTIVITAMINS THER. W/MINERALS) TABS Take 1 tablet by mouth daily.    . naproxen (NAPROSYN) 500 MG tablet Take 1 tablet (500 mg total) 3 (three) times daily with meals as needed by mouth for moderate pain. 30 tablet 0  . pantoprazole (PROTONIX) 40 MG tablet Take 1 tablet (40 mg total) by mouth 2 (two) times daily. 180 tablet 3  . polyethylene glycol powder (GLYCOLAX/MIRALAX) powder Take 17 g by mouth daily as needed. (Patient taking differently: Take 17 g by mouth daily as needed for moderate constipation. ) 3350 g 3  . spironolactone (ALDACTONE) 25 MG tablet Take 1 tablet (25 mg total) by mouth daily. 30 tablet 3  . traMADol (ULTRAM) 50 MG tablet Take 1 tablet (50 mg total) by mouth every 8 (eight) hours as needed for moderate pain. 30 tablet 0   No current facility-administered medications for this visit.      Allergies:   Fentanyl   Social History:  The patient  reports that  has never smoked. she has never used smokeless tobacco. She reports that she does not drink alcohol or use drugs.   Family History:  The patient's family history is not on file.   ROS:  Please see the history of present illness.   All other systems reviewed and negative.    PHYSICAL EXAM: VS:  BP (!) 108/52   Pulse 88   Ht 5\' 2"  (1.575 m)   Wt 117 lb 3.2 oz (53.2 kg)   BMI 21.44 kg/m  Affect appropriate Frail elderly female  HEENT: normal Neck supple with no adenopathy JVP normal no bruits no thyromegaly Lungs clear with no wheezing and good diaphragmatic motion Heart:  S1/S2 no murmur, no rub, gallop or click PMI normal Abdomen: benighn, BS positve, no tenderness, no AAA no bruit.  No HSM or HJR Distal pulses intact with no bruits No edema Neuro non-focal Skin warm and dry No muscular weakness    EKG:  HR 74  NSR RBBB  02/06/17  SR rate 70 RBBB LAFB      ASSESSMENT AND PLAN:  LYRICK [name] is a 84 y.o. Marland Kitchen female w/ a hx of chronic diastolic CHF, HTN, atypical CP s/p normal myoview in 2007 and 2009, breast CA s/p L mastectomy, chronic phlegm/cough, chronic edema, anxiety, depression, diverticulosis/gastritis/functional abdominal pain    HTN- Well controlled.  Continue current medications and low sodium Dash type diet.    LE edema- chronic. Felt to be due to venous insufficiency  --  Continue Lasix 40mg  po PRN as needed for swelling  Diastolic Dysfunction:  euvolemic continue current meds given age no need for f/u echo   RBBB:  Chronic no bradycardia or high grade AV block yearly ECG in order   Dementia:  Progressive no living in SNF   Disposition:   FU with cardiology PRN     Jenkins Rouge

## 2017-12-18 ENCOUNTER — Ambulatory Visit: Payer: Medicare Other | Admitting: Cardiovascular Disease

## 2017-12-18 ENCOUNTER — Encounter: Payer: Self-pay | Admitting: Cardiovascular Disease

## 2017-12-18 VITALS — BP 108/52 | HR 88 | Ht 62.0 in | Wt 117.2 lb

## 2017-12-18 DIAGNOSIS — I10 Essential (primary) hypertension: Secondary | ICD-10-CM

## 2017-12-18 NOTE — Patient Instructions (Addendum)
Medication Instructions:  Your physician recommends that you continue on your current medications as directed. Please refer to the Current Medication list given to you today.  Labwork: NONE  Testing/Procedures: NONE  Follow-Up: Your physician wants you to follow-up as needed with  Dr. Nishan.    If you need a refill on your cardiac medications before your next appointment, please call your pharmacy.    

## 2017-12-21 ENCOUNTER — Ambulatory Visit: Payer: Medicare Other | Admitting: Occupational Therapy

## 2017-12-21 ENCOUNTER — Ambulatory Visit: Payer: Medicare Other | Admitting: Rehabilitation

## 2017-12-26 IMAGING — RF DG SWALLOWING FUNCTION
1 series · 1 of 1 positions shown · non-contrast
Comparison: Chest radiographs 09/21/2015

CLINICAL DATA: [AGE] female with dysphagia and unintentional
weight loss.

EXAM:
MODIFIED BARIUM SWALLOW
TECHNIQUE: Different consistencies of barium were administered orally to the
patient by the Speech Pathologist. Imaging of the pharynx was
performed in the lateral projection.
FLUOROSCOPY TIME:  Radiation Exposure Index (as provided by the
fluoroscopic device): 16.01 mGy.
If the device does not provide the exposure index:
Fluoroscopy Time:  4 minutes 27 seconds

[Series 1: run · 1 of 1 slices shown]
[im 1/1]
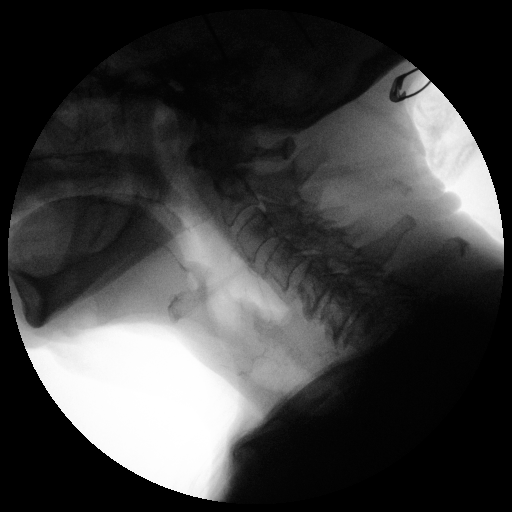

[1 of 1 positions shown; findings below may reference images not displayed]

FINDINGS: Thin liquid- delayed oral transit, otherwise within normal limits.

Nectar thick liquid- not administered.

Honey- not administered.

Apple sauce - delayed oral transit, otherwise within normal limits.

Cracker-given with water. Delayed oral transit. Residual. Otherwise
within normal limits.

Bernabe?Sophat with cracker- not administered

Barium tablet - delayed oral transit. When the patient was unable to
move the tablet to the tongue base she was given applesauce, and
after additional delayed oral transit both the applesauce and tablet
passed. The tablet passed through the thoracic esophagus quickly
with no delay.
IMPRESSION: Dominant finding of delayed oral transit with all consistencies.

Please refer to the Speech Pathologists report for complete details
and recommendations.

## 2018-01-04 ENCOUNTER — Ambulatory Visit (INDEPENDENT_AMBULATORY_CARE_PROVIDER_SITE_OTHER): Payer: Medicare Other | Admitting: Family Medicine

## 2018-01-04 ENCOUNTER — Encounter: Payer: Self-pay | Admitting: Family Medicine

## 2018-01-04 VITALS — BP 110/80 | HR 86 | Temp 98.6°F | Ht 62.0 in | Wt 118.6 lb

## 2018-01-04 DIAGNOSIS — R109 Unspecified abdominal pain: Secondary | ICD-10-CM | POA: Diagnosis not present

## 2018-01-04 DIAGNOSIS — Z0000 Encounter for general adult medical examination without abnormal findings: Secondary | ICD-10-CM

## 2018-01-04 MED ORDER — FAMOTIDINE 20 MG PO TABS: 20.0000 mg | ORAL_TABLET | Freq: Two times a day (BID) | ORAL | 6 refills | Status: DC

## 2018-01-04 NOTE — Patient Instructions (Addendum)
It was a pleasure to see you today! Thank you for choosing [facility] for your primary care. Heather [name] was seen for her physical and for her abdominal pain.   Our plans for today were:  Take more Miralax to soften your bowel movements  Try Pepcid for your stomach pain  You should return to our clinic to see 3 months for a checkup.   Best,  Dr. Shan Levans

## 2018-01-04 NOTE — Progress Notes (Signed)
Subjective:    Heather [name] - 84 y.o. female MRN [id number]  Date of birth: 1918-10-28  HPI  Heather [name] is here for a physical.  She also continues to worry about her left-sided abdominal pain and weight loss.  She says that she feels sure that there is something wrong with her such as cancer and was surprised that the CT abdomen/pelvis done in the ED did not show any significant abnormalities.  She continues to get yearly mammograms.  She sometimes has large bowel movements after a couple days without bowel movements.  She denies any recent changes to her health  Health Maintenance:  Health Maintenance Due  Topic Date Due  . TETANUS/TDAP  06/13/1939  . PNA vac Low Risk Adult (1 of 2 - PCV13) 06/12/1985    -  reports that  has never smoked. she has never used smokeless tobacco. - Review of Systems: Per HPI. - Past Medical History: Patient Active Problem List   Diagnosis Date Noted  . Urinary incontinence without sensory awareness 12/02/2017  . Arthritis of left hip 10/20/2017  . Functional abdominal pain syndrome 09/02/2017  . Encounter for chronic pain management 07/22/2017  . Dementia 06/25/2017  . Vitamin D deficiency 05/21/2017  . Abnormal CT scan of lung 07/16/2015  . Esophageal dysmotility 10/27/2014  . Failure to thrive in adult 04/26/2014  . Arthritis of hand, degenerative 04/10/2014  . Other and unspecified hyperlipidemia 04/10/2014  . LPRD (laryngopharyngeal reflux disease) 04/04/2014  . Chronic bronchitis ([place]) 05/30/2013  . Hx of breast cancer 08/30/2012  . Edema 06/12/2011  . GERD 03/27/2010  . Diastolic CHF ([place]) 83/15/1761  . Constipation 01/18/2009  . Carpal tunnel syndrome on both sides 10/03/2008  . Cervical spondylosis without myelopathy 03/21/2008  . Diaphragmatic hernia 12/26/2007  . Renal cyst 12/26/2007  . Primary hypertension 09/15/2007  . Venous (peripheral) insufficiency 09/15/2007  . Osteoarthritis 09/15/2007  . Lumbago 09/15/2007    . Diverticulosis of colon 05/24/2002   - Medications: reviewed and updated   Objective:   Physical Exam BP 110/80   Pulse 86   Temp 98.6 F (37 C)   Ht 5\' 2"  (1.575 m)   Wt 53.8 kg (118 lb 9.6 oz)   SpO2 96%   BMI 21.69 kg/m  Gen: NAD, alert, cooperative with exam, thin with low muscle mass, well-appearing for her age 37: NCAT, PERRL, clear conjunctiva, oropharynx clear, supple neck, clear tympanic membranes bilaterally CV: RRR, good S1/S2, no murmur, 2+ pitting edema on bilateral shins, stable from previous exams Resp: CTABL, no wheezes, non-labored, no masses or tenderness on chest wall Abd: abdomen tender on left side as in previous exams, BS present, no guarding or organomegaly Skin: no rashes, normal turgor  Neuro: no gross deficits.  Psych: anxious with pressured speech and slightly irritable, all of which are consistent with previous exams     Assessment & Plan:   Functional abdominal pain syndrome Patient gets frequent check ups for chronic abdominal pain that has had a very thorough workup in the past.  She is doing very well for a 84 year old, and I believe she most benefits from minimizing intervention and frequent reassurance.  Her weight is stable, and she has a healthy BMI.  I see nothing concerning or changed on my exam today.  I will prescribe Pepcid to see if we can wean off her PPI in the future.  I also encouraged Ms. Killmer to take Miralax every day, since her left-sided abdominal  pain could be due to constipation.  She might benefit from referral to geriatric clinic, but she is very independent-minded and defensive about the idea of needing help at home or having cognitive decline.  She will likely benefit from future appointments with me to help alleviate her anxiety, but she will likely always be very anxious about her health.    Maia Breslow, M.D. 01/05/2018, 6:45 PM PGY-1, [place]

## 2018-01-05 NOTE — Assessment & Plan Note (Addendum)
Patient gets frequent check ups for chronic abdominal pain that has had a very thorough workup in the past.  She is doing very well for a 84 year old, and I believe she most benefits from minimizing intervention and frequent reassurance.  Her weight is stable, and she has a healthy BMI.  I see nothing concerning or changed on my exam today.  I will prescribe Pepcid to see if we can wean off her PPI in the future.  I also encouraged Heather [name] to take Miralax every day, since her left-sided abdominal pain could be due to constipation.  She might benefit from referral to geriatric clinic, but she is very independent-minded and defensive about the idea of needing help at home or having cognitive decline.  She will likely benefit from future appointments with me to help alleviate her anxiety, but she will likely always be very anxious about her health.

## 2018-01-20 ENCOUNTER — Other Ambulatory Visit: Payer: Self-pay | Admitting: Nurse Practitioner

## 2018-01-20 DIAGNOSIS — I872 Venous insufficiency (chronic) (peripheral): Secondary | ICD-10-CM

## 2018-01-20 DIAGNOSIS — I5033 Acute on chronic diastolic (congestive) heart failure: Secondary | ICD-10-CM

## 2018-01-27 ENCOUNTER — Telehealth: Payer: Self-pay | Admitting: Family Medicine

## 2018-01-27 NOTE — Telephone Encounter (Signed)
Pt thinks her Tramadol constipates her.  What can she take different? When she does have a BM, there is blood in the stool.  Please advise

## 2018-01-29 NOTE — Telephone Encounter (Signed)
I would recommend that she stick with regular Tylenol and not to use the Tramadol if she finds it to be constipating.  She can also increase the Miralax she takes and continue the Tramadol if she finds that Tramadol is more helpful in alleviating her pain than Tylenol.  Regarding the blood in her stool, I would like to see her in the clinic to discuss this further.  Thank you!

## 2018-02-01 NOTE — Telephone Encounter (Signed)
Contacted pt and gave her the below information and she said that the tylenol does not work for her and that she is taking Dulcolax and it works better than the [employer]. Suggested that pt come in and go over her medications.  Her Tramadol was written for 30 tablets with no refills on 12/01/2017. Pt is going to get in touch with her son and then will call back to schedule an appointment.  Dr. Shan Levans has afternoon appointments on 3/27 and 3/29 if pt calls back. Routing to PCP as an Pharmacist, hospital.

## 2018-02-02 NOTE — Telephone Encounter (Signed)
Heather [name] has said that Tramadol does not work well for her, so this medication was not continued at her last appointment.  Unfortunately, her pain has not been alleviated by any of her medications, she usually experiences side effects from most of them.  I'm glad that Dulcolax is helping her.  I will be happy to see her to discuss her pain control and medications whenever she can make an appointment.

## 2018-02-02 NOTE — Telephone Encounter (Signed)
Pt is seeing Dr. Reesa Chew on 02/05/18. Ottis Stain, CMA

## 2018-02-05 ENCOUNTER — Ambulatory Visit: Payer: Medicare Other | Admitting: Family Medicine

## 2018-02-05 ENCOUNTER — Other Ambulatory Visit: Payer: Self-pay

## 2018-02-05 VITALS — BP 100/80 | HR 78 | Temp 99.1°F | Ht 62.0 in | Wt 119.6 lb

## 2018-02-05 DIAGNOSIS — M533 Sacrococcygeal disorders, not elsewhere classified: Secondary | ICD-10-CM

## 2018-02-05 NOTE — Patient Instructions (Signed)
You were seen in clinic today to follow-up on back pain.  I am sending you for an x-ray of your lower spine to rule out fracture.  You can get this done at the first floor radiology department at [facility] and do not need an appointment.  I will follow-up with you with the results of your x-ray.  In the meantime, you can continue to take tramadol for pain management.

## 2018-02-05 NOTE — Progress Notes (Addendum)
Subjective:   Patient ID: Heather [name]    DOB: [birth year], 84 y.o. female   MRN: [id number]  CC: f/u fall   HPI: Heather [name] is a 84 y.o. female who presents to clinic today following issues.  Status post fall Patient states she sustained a fall when she sat down on her commode about a week ago.  She did not experience any pain following this event however has recently had some persistent back pain localized to her sacral area in the last 2-3 days.  She has been taking tramadol and naproxen as needed for pain relief.  She feels the tramadol has helped.  She is concerned she may have fractured a vertebrae. Has not seen a provider or had imaging performed following the fall.  Additionally, she has some questions regarding her medications.  She feels as though tramadol causes her constipation and was previously told to discontinue this.  Per chart review, it appears Naproxen causes her face and tongue to become swollen.  No fever, chills, nausea, vomiting.  No dizziness or lightheadedness.  Denies weakness, numbness, tingling.  Denies bowel or bladder incontinence.  ROS: No fever, chills, nausea, vomiting.    Social: Patient is a never smoker Medications reviewed. Objective:   BP 100/80 (BP Location: Right Arm, Patient Position: Sitting, Cuff Size: Normal)   Pulse 78   Temp 99.1 F (37.3 C) (Oral)   Ht 5\' 2"  (1.575 m)   Wt 119 lb 9.6 oz (54.3 kg)   SpO2 95%   BMI 21.88 kg/m  Vitals and nursing note reviewed.  General: Elderly 84 year old female, NAD HEENT: NCAT, EOMI, mom, oropharynx clear Neck: supple, nontender, normal range of motion CV: regular rate and rhythm without murmurs rubs or gallops Lungs: clear to auscultation bilaterally with normal work of breathing Abdomen: soft, NT ND, positive bowel sounds -MSK: Back-no obvious bony deformities, no overlying erythema or bruising present, mild tenderness to palpation over left SI joint, and left sacral paraspinal muscles,  range of motion is somewhat restricted in setting of pain and difficult to evaluate  Skin: warm, dry, no rash Extremities: warm and well perfused, normal tone Neuro: Alert, oriented x2, motor strength 4/5 bilaterally in upper and lower extremities, normal sensation  Assessment & Plan:   Sacral pain Acute sacral back pain following a fall 1 week ago at home.  Would warrant plain films to rule out fracture.  Neurovascularly intact.  No obvious bony deformities, erythema, bruising or red flags on exam.  -Will obtain x-ray lumbar spine -May continue Tramadol for pain relief; patient is highly concerned about being on too many medications.  Reassured patient and advised that she may take Tramadol and Tylenol for this. -Advised to avoid Naproxen given report of facial and tongue swelling per chart review -Follow-up with PCP to review medications  Orders Placed This Encounter  Procedures  . DG Lumbar Spine Complete    Standing Status:   Future    Standing Expiration Date:   04/08/2019    Order Specific Question:   Reason for Exam (SYMPTOM  OR DIAGNOSIS REQUIRED)    Answer:   sacral back pain    Order Specific Question:   Preferred imaging location?    Answer:   [facility]    Order Specific Question:   Radiology Contrast Protocol - do NOT remove file path    Answer:   \\[email or web address]   F/u 1 week to review medications.   Lovenia Kim, MD [place], PGY-2 02/07/2018 3:33 PM

## 2018-02-07 ENCOUNTER — Encounter: Payer: Self-pay | Admitting: Family Medicine

## 2018-02-07 DIAGNOSIS — M533 Sacrococcygeal disorders, not elsewhere classified: Secondary | ICD-10-CM | POA: Insufficient documentation

## 2018-02-07 NOTE — Assessment & Plan Note (Addendum)
Acute sacral back pain following a fall 1 week ago at home.  Would warrant plain films to rule out fracture.  Neurovascularly intact.  No obvious bony deformities, erythema, bruising or red flags on exam.  -Will obtain x-ray lumbar spine -May continue Tramadol for pain relief; patient is highly concerned about being on too many medications.  Reassured patient and advised that she may take Tramadol and Tylenol for this. -Advised to avoid Naproxen given report of facial and tongue swelling per chart review -Follow-up with PCP to review medications

## 2018-02-08 ENCOUNTER — Ambulatory Visit (HOSPITAL_COMMUNITY)
Admission: RE | Admit: 2018-02-08 | Discharge: 2018-02-08 | Disposition: A | Discharge status: Home / Self Care | Payer: Medicare Other | Source: Ambulatory Visit | Attending: Family Medicine | Admitting: Family Medicine

## 2018-02-08 DIAGNOSIS — M5136 Other intervertebral disc degeneration, lumbar region: Secondary | ICD-10-CM | POA: Insufficient documentation

## 2018-02-08 DIAGNOSIS — M545 Low back pain: Secondary | ICD-10-CM | POA: Diagnosis not present

## 2018-02-08 DIAGNOSIS — M47896 Other spondylosis, lumbar region: Secondary | ICD-10-CM | POA: Diagnosis not present

## 2018-02-08 DIAGNOSIS — M533 Sacrococcygeal disorders, not elsewhere classified: Secondary | ICD-10-CM | POA: Insufficient documentation

## 2018-02-09 ENCOUNTER — Telehealth: Payer: Self-pay | Admitting: Family Medicine

## 2018-02-09 NOTE — Telephone Encounter (Signed)
Pt would like to know her results from her xray she had on her back from 3/25. Pt not real please she didn't see her own PCP at her last visit. She would like Dr Shan Levans to call her back with these results. Please advise

## 2018-02-09 NOTE — Telephone Encounter (Signed)
Discussed spine x-ray results with patient and answered patient's questions about her medication regimen.  I've made an appointment for 4/12 at 3:45 to continue discussing her medications.  Patient is aware of appointment.

## 2018-02-22 ENCOUNTER — Telehealth: Payer: Self-pay

## 2018-02-22 NOTE — Telephone Encounter (Signed)
Pt called nurse line confused about her medications. States she has been on furosemide for "fluid" but received a new fx for famotidine. Wanting to know if famotidine was taking place of furosemide. Advised famotidine is for acid reflux, to take it in addition to furosemide not in place of furosemide. Pt has appt on 02/26/18 Wallace Cullens, RN

## 2018-02-26 ENCOUNTER — Other Ambulatory Visit: Payer: Self-pay

## 2018-02-26 ENCOUNTER — Encounter: Payer: Self-pay | Admitting: Family Medicine

## 2018-02-26 ENCOUNTER — Ambulatory Visit: Payer: Medicare Other | Admitting: Family Medicine

## 2018-02-26 VITALS — BP 120/58 | HR 84 | Temp 98.2°F | Ht 62.0 in | Wt 118.0 lb

## 2018-02-26 DIAGNOSIS — R109 Unspecified abdominal pain: Secondary | ICD-10-CM

## 2018-02-26 DIAGNOSIS — F411 Generalized anxiety disorder: Secondary | ICD-10-CM | POA: Diagnosis not present

## 2018-02-26 DIAGNOSIS — Z79899 Other long term (current) drug therapy: Secondary | ICD-10-CM

## 2018-02-26 DIAGNOSIS — K5900 Constipation, unspecified: Secondary | ICD-10-CM | POA: Diagnosis not present

## 2018-02-26 MED ORDER — ALPRAZOLAM 0.25 MG PO TABS: 0.2500 mg | ORAL_TABLET | Freq: Two times a day (BID) | ORAL | 1 refills | Status: DC | PRN

## 2018-02-26 NOTE — Patient Instructions (Signed)
It was nice seeing you today Heather [name]!  Today, we reviewed your medications.  We decided to stop your Naproxen since this medication can damage the kidneys and the stomach.  I also refilled your Alpazolam, and we discussed that this medication should only be taken as needed, which you are doing correctly.  We also discussed your abdominal pain.  I am reassured that your weight is staying constant and you are eating well.  Please continue the Miralax and prunes for your constipation.  If you have any questions or concerns, please feel free to call the clinic.   Be well,  Dr. Shan Levans

## 2018-02-26 NOTE — Progress Notes (Signed)
Subjective:    Heather [name] - 84 y.o. female MRN [id number]  Date of birth: [birth year]  HPI  Heather [name] is here to review her medications.  She is also complaining of abdominal pain, which is a chronic issue for her.  We went through each of her medications and reviewed what they were treating.  We agreed to stop the Naproxen, since she is primarily taking tylenol and tramadol for her pain, and Naproxen can be damaging to her kidneys and stomach.  We made no other changes to her medications.  Her chronic abdominal pain continues to bother her, and she says that "everything has moved over to the left side."  She got a recent CT scan that was negative for any concerning findings, but she is sure that something is wrong with her stomach.    Health Maintenance:  Health Maintenance Due  Topic Date Due  . TETANUS/TDAP  06/13/1939  . PNA vac Low Risk Adult (1 of 2 - PCV13) 06/12/1985    -  reports that she has never smoked. She has never used smokeless tobacco. - Review of Systems: Per HPI. - Past Medical History: Patient Active Problem List   Diagnosis Date Noted  . Medication management 02/28/2018  . Sacral pain 02/07/2018  . Urinary incontinence without sensory awareness 12/02/2017  . Arthritis of left hip 10/20/2017  . Functional abdominal pain syndrome 09/02/2017  . Encounter for chronic pain management 07/22/2017  . Dementia 06/25/2017  . Vitamin D deficiency 05/21/2017  . Abnormal CT scan of lung 07/16/2015  . Esophageal dysmotility 10/27/2014  . Failure to thrive in adult 04/26/2014  . Arthritis of hand, degenerative 04/10/2014  . Other and unspecified hyperlipidemia 04/10/2014  . LPRD (laryngopharyngeal reflux disease) 04/04/2014  . Chronic bronchitis ([place]) 05/30/2013  . Hx of breast cancer 08/30/2012  . Edema 06/12/2011  . GERD 03/27/2010  . Diastolic CHF ([place]) 81/44/8185  . Constipation 01/18/2009  . Carpal tunnel syndrome on both sides 10/03/2008  .  Cervical spondylosis without myelopathy 03/21/2008  . Diaphragmatic hernia 12/26/2007  . Renal cyst 12/26/2007  . Primary hypertension 09/15/2007  . Venous (peripheral) insufficiency 09/15/2007  . Osteoarthritis 09/15/2007  . Lumbago 09/15/2007  . Diverticulosis of colon 05/24/2002   - Medications: reviewed and updated   Objective:   Physical Exam BP (!) 120/58   Pulse 84   Temp 98.2 F (36.8 C) (Oral)   Ht 5\' 2"  (1.575 m)   Wt 118 lb (53.5 kg)   SpO2 97%   BMI 21.58 kg/m  Gen: NAD, alert, cooperative with exam, elderly but energetic HEENT: NCAT, clear conjunctiva, CV: RRR, good S1/S2, no murmur, no edema Resp: CTABL, no wheezes, non-labored Abd: some tenderness to palpation of abdomen especially on L side, no hernia palpated, BS present, no guarding or organomegaly Skin: no rashes, normal turgor  Neuro: no gross deficits.  Psych: mildly anxious, which is stable from previous visits        Assessment & Plan:   Medication management Patient's current medication regimen seems to be appropriate for her.  I did refill her Xanax since she only takes this medication about once or twice per month, and it seems to help her anxiety.  I discontinued the naproxen.  Functional abdominal pain syndrome Patient's abdominal pain is stable from previous visits.  Some of her pain stems from anxiety and somatization.  I believe the best course for Heather [name] at her age is empathy and reassurance that she is  doing well despite her abdominal pain.  This response seems to work well for her.    Maia Breslow, M.D. 02/28/2018, 2:50 PM PGY-1, [place]

## 2018-02-28 DIAGNOSIS — Z79899 Other long term (current) drug therapy: Secondary | ICD-10-CM | POA: Insufficient documentation

## 2018-02-28 NOTE — Assessment & Plan Note (Signed)
Patient's abdominal pain is stable from previous visits.  Some of her pain stems from anxiety and somatization.  I believe the best course for Heather [name] at her age is empathy and reassurance that she is doing well despite her abdominal pain.  This response seems to work well for her.

## 2018-02-28 NOTE — Assessment & Plan Note (Signed)
Patient's current medication regimen seems to be appropriate for her.  I did refill her Xanax since she only takes this medication about once or twice per month, and it seems to help her anxiety.  I discontinued the naproxen.

## 2018-03-15 ENCOUNTER — Telehealth: Payer: Self-pay | Admitting: Family Medicine

## 2018-03-15 NOTE — Telephone Encounter (Signed)
Heather [name] wants to talk to the dr. She is convinced something is eating up her insides.  She continues to be in pain and drs cant tell her why.  . She is convinced she has cancer.  She thinks she needs liver tests, kidney tests to find this cancer.

## 2018-03-16 NOTE — Telephone Encounter (Signed)
I am happy to discuss Heather [name]'s concerns with her in person.  I made an appointment for her on 5/15 at 3:45.

## 2018-03-16 NOTE — Telephone Encounter (Signed)
Tried to contact pt to be sure that she was aware of her upcoming appointment with Dr. Shan Levans and VM picked up.  Did not LVM due to [facility] stating not to LVM on any phone.  If pt calls back please inform her of her upcoming appointment if she is not already aware. Katharina Caper, April D, [place]

## 2018-03-17 ENCOUNTER — Other Ambulatory Visit: Payer: Self-pay | Admitting: Family Medicine

## 2018-03-17 MED ORDER — ACETAMINOPHEN-CODEINE #2 300-15 MG PO TABS: 1.0000 | ORAL_TABLET | ORAL | 0 refills | Status: DC | PRN

## 2018-03-17 NOTE — Progress Notes (Signed)
Patient would like something different for her arthritis pain and says that Tramadol is making her too constipated and isn't working well.  Tylenol #2 has worked well for her in the past, so we will try this again.  I advised her to stop the Tramadol and the regular Tylenol when she starts taking Tylenol #2.  Patient will make an appointment when she knows when her son is off of work.

## 2018-03-30 ENCOUNTER — Telehealth: Payer: Self-pay | Admitting: Family Medicine

## 2018-03-30 NOTE — Telephone Encounter (Signed)
Called patient and said that I would like to have her come in this week so that we can continue talking about this.  She will call the clinic tomorrow once she knows when she can make an appointment.

## 2018-03-30 NOTE — Telephone Encounter (Signed)
She said someone was supposed to come out to her house but they didn't come.  She is having issues with her stomach and has some questions.  Her medication for this is not working.  Please call her asap today at [phone]

## 2018-03-31 ENCOUNTER — Ambulatory Visit: Payer: Medicare Other | Admitting: Family Medicine

## 2018-04-05 ENCOUNTER — Ambulatory Visit: Payer: Medicare Other | Admitting: Family Medicine

## 2018-04-07 ENCOUNTER — Ambulatory Visit: Payer: Medicare Other | Admitting: Family Medicine

## 2018-04-07 ENCOUNTER — Other Ambulatory Visit: Payer: Self-pay

## 2018-04-07 ENCOUNTER — Encounter: Payer: Self-pay | Admitting: Family Medicine

## 2018-04-07 VITALS — BP 110/56 | HR 97 | Temp 98.6°F | Wt 116.0 lb

## 2018-04-07 DIAGNOSIS — M549 Dorsalgia, unspecified: Secondary | ICD-10-CM | POA: Diagnosis not present

## 2018-04-07 DIAGNOSIS — F451 Undifferentiated somatoform disorder: Secondary | ICD-10-CM

## 2018-04-07 LAB — POCT URINALYSIS DIP (MANUAL ENTRY)
BILIRUBIN UA: NEGATIVE
Glucose, UA: NEGATIVE mg/dL
Ketones, POC UA: NEGATIVE mg/dL
Nitrite, UA: NEGATIVE
PH UA: 5.5 (ref 5.0–8.0)
PROTEIN UA: NEGATIVE mg/dL
Spec Grav, UA: 1.01 (ref 1.010–1.025)
Urobilinogen, UA: 0.2 E.U./dL

## 2018-04-07 LAB — POCT UA - MICROSCOPIC ONLY

## 2018-04-07 MED ORDER — SENNA 8.6 MG PO TABS: 1.0000 | ORAL_TABLET | Freq: Every day | ORAL | 0 refills | Status: DC

## 2018-04-07 NOTE — Progress Notes (Signed)
Subjective:    Heather [name] - 84 y.o. female MRN [id number]  Date of birth: 1918-02-15  HPI  Heather [name] is here for back pain.  She says that she thinks it may be due to her kidney stone that she had in the past.  She also has abdominal pain, but she says her GI doctor told her that her pain was likely coming from her back.  She says that Tylenol #2 sometimes helps with her pain and sometimes does not.  She thinks that there is something wrong with her kidneys that is causing her pain and would like a referral to a nephrologist.  Health Maintenance:  Health Maintenance Due  Topic Date Due  . TETANUS/TDAP  06/13/1939  . PNA vac Low Risk Adult (1 of 2 - PCV13) 06/12/1985    -  reports that she has never smoked. She has never used smokeless tobacco. - Review of Systems: Per HPI. - Past Medical History: Patient Active Problem List   Diagnosis Date Noted  . Back pain 04/08/2018  . Medication management 02/28/2018  . Sacral pain 02/07/2018  . Urinary incontinence without sensory awareness 12/02/2017  . Arthritis of left hip 10/20/2017  . Functional abdominal pain syndrome 09/02/2017  . Encounter for chronic pain management 07/22/2017  . Dementia 06/25/2017  . Vitamin D deficiency 05/21/2017  . Abnormal CT scan of lung 07/16/2015  . Esophageal dysmotility 10/27/2014  . Failure to thrive in adult 04/26/2014  . Arthritis of hand, degenerative 04/10/2014  . Other and unspecified hyperlipidemia 04/10/2014  . LPRD (laryngopharyngeal reflux disease) 04/04/2014  . Chronic bronchitis ([place]) 05/30/2013  . Hx of breast cancer 08/30/2012  . Edema 06/12/2011  . GERD 03/27/2010  . Diastolic CHF ([place]) 08/09/3006  . Constipation 01/18/2009  . Carpal tunnel syndrome on both sides 10/03/2008  . Cervical spondylosis without myelopathy 03/21/2008  . Diaphragmatic hernia 12/26/2007  . Renal cyst 12/26/2007  . Primary hypertension 09/15/2007  . Venous (peripheral) insufficiency  09/15/2007  . Osteoarthritis 09/15/2007  . Lumbago 09/15/2007  . Diverticulosis of colon 05/24/2002   - Medications: reviewed and updated   Objective:   Physical Exam BP (!) 110/56   Pulse 97   Temp 98.6 F (37 C) (Oral)   Wt 116 lb (52.6 kg)   SpO2 96%   BMI 21.22 kg/m  Gen: NAD, alert, cooperative with exam HEENT: NCAT, clear conjunctiva, supple neck CV: RRR, good M2/U6, 2/6 systolic murmur, no edema Resp: CTABL, no wheezes, non-labored Abd: tender to palpation especially in LLQ which is a stable exam finding for this patient, BS present, no guarding or organomegaly, no CVA tenderness Skin: no rashes, normal turgor  Neuro: no gross deficits.  Psych: poor insight, pressured speech, some tangential thought processes        Assessment & Plan:   Back pain Patient's back pain is likely due to osteoarthritis, which has been shown on a lumbar spine x-ray.  The x-ray and patient's CMP from January 2019 were reviewed with patient.  CMP showed no electrolyte abnormalities and a GFR of 56, which is excellent for her age.  We also obtained a UA with microscopy to see if patient would except this information and reconsider a nephrology referral, but patient was adamant that she be referred to nephrology since "she needs an expert to tell her about her kidneys."  Due to patient preference, she was referred to nephrology for further workup.  Constipation Will add daily Senna to patient's bowel regimen  due to her LLQ pain and patient's request.    Maia Breslow, M.D. 04/08/2018, 11:46 AM PGY-1, [place]

## 2018-04-07 NOTE — Patient Instructions (Signed)
It was nice seeing you today Heather [name]!  Today, we got a urine test.  We will contact you if it is abnormal.  We also referred you to the kidney doctor.  I prescribed a medication called Senna to help with your constipation.  Please take this medication once per day.  If you have any questions or concerns, please feel free to call the clinic.   Be well,  Dr. Shan Levans

## 2018-04-08 ENCOUNTER — Telehealth: Payer: Self-pay | Admitting: Family Medicine

## 2018-04-08 DIAGNOSIS — M549 Dorsalgia, unspecified: Secondary | ICD-10-CM | POA: Insufficient documentation

## 2018-04-08 NOTE — Assessment & Plan Note (Signed)
Will add daily Senna to patient's bowel regimen due to her LLQ pain and patient's request.

## 2018-04-08 NOTE — Telephone Encounter (Signed)
Called Heather [name] to reassure her that her urine results were unchanged from previous and that she should continue to see me at regular intervals.  She should receive a call about her kidney doctor appointment in the next week or two.

## 2018-04-08 NOTE — Assessment & Plan Note (Signed)
Patient's back pain is likely due to osteoarthritis, which has been shown on a lumbar spine x-ray.  The x-ray and patient's CMP from January 2019 were reviewed with patient.  CMP showed no electrolyte abnormalities and a GFR of 56, which is excellent for her age.  We also obtained a UA with microscopy to see if patient would except this information and reconsider a nephrology referral, but patient was adamant that she be referred to nephrology since "she needs an expert to tell her about her kidneys."  Due to patient preference, she was referred to nephrology for further workup.

## 2018-04-09 LAB — URINE CULTURE: Organism ID, Bacteria: NO GROWTH

## 2018-04-19 ENCOUNTER — Telehealth: Payer: Self-pay

## 2018-04-19 NOTE — Telephone Encounter (Signed)
Patient called and stated her druggist wanted to ask if she is supposed to be on both Furosemide and Spironolactone?  Also, patient has many pill bottles from different physicians and admits to some confusion about which she is to take. Interested in an appt with pharmD for medication management.  Call back is [phone]  Danley Danker, RN [facility])

## 2018-04-20 NOTE — Telephone Encounter (Signed)
Tried to reach pt. Phone was busy. Will try to call tomorrow. Ottis Stain, CMA

## 2018-04-20 NOTE — Telephone Encounter (Signed)
It is okay that Heather [name] is on both diuretics.  Please ask Heather [name] if she would like a home health nurse to come by her house and help her with medications.  If not, we can see her in the clinic to go over medications.  This was done last month, but I understand that Heather [name] sometimes has trouble remembering - this is why a home health nurse may be helpful for her.  Thank you!

## 2018-04-21 ENCOUNTER — Other Ambulatory Visit: Payer: Self-pay | Admitting: Family Medicine

## 2018-04-21 DIAGNOSIS — F0390 Unspecified dementia without behavioral disturbance: Secondary | ICD-10-CM

## 2018-04-21 NOTE — Telephone Encounter (Signed)
Contacted pt and she said that she would like to have someone come out to her home.  She said she needs help and that someone used to come and now they don't.  She said as soon as we can get someone out there would be good.  Also in this conversation she mentioned that Dr. Shan Levans was going to set her up with "our people" to go over her medications I did not see an appointment and she stated that Dr. Shan Levans said she was going to set this up, she said " Im just telling you what she said".  She mentioned that she has an appointment on the 18th which I checked and she does not with [place].  I told her that maybe that appointment was at another doctor office.  Pt has an appointment with Dr. Valentina Lucks in the clinic tomorrow at 2:30 because she insist that this is urgent. I told pt to bring all of her medications with her to this appointment and pt understood. Katharina Caper, April D, [place]

## 2018-04-21 NOTE — Telephone Encounter (Signed)
Thank you Heather [name], that was very helpful.  I did not tell her that I would set her up with anyone, but I am happy to order home health nursing to visit her home.

## 2018-04-22 ENCOUNTER — Encounter: Payer: Self-pay | Admitting: Pharmacist

## 2018-04-22 ENCOUNTER — Ambulatory Visit: Payer: Medicare Other | Admitting: Pharmacist

## 2018-04-22 DIAGNOSIS — Z79899 Other long term (current) drug therapy: Secondary | ICD-10-CM

## 2018-04-22 NOTE — Progress Notes (Signed)
    S:     Chief Complaint  Patient presents with  . Medication Management    Med Review    Patient arrives in good spirits, walking with assistance of a walker and with stooped posture.  Presents for medication review evaluation, education, and management at the request of PCP, Dr. Shan Levans, referred and last seen on 04/21/2018.    Brings in medications for review. Denies use of medication box however she is interested in using one. Reports infrequent use of alprazolam and tylenol #2 but cannot describe how often - Alprazolam last filled 02/28/18 QTY #30 and patient has 24 tablets left. Tylenol #2 last filled 03/18/2018 QTY #30 and patient has 4 tabs left. She endorses confusion around why she is taking two fluid medications (furosemide and spironolactone, taking both in the morning). Endorses polyuria and nocturia causing incontinence in her sleep. She wears adult diapers for this. Has f/u schedule with urologist to discuss this.   Complains of lower extremity edema that is persistent regardless of whether she takes the furosemide and the spironolactone or not. Patient complains of stomach pain x9 months and reports it is getting worse. Rates pain 10/10. Reports "I don't know I even have acid reflux" but endorses adherence to famotidine 20 mg BID. Patient complains of weight loss. Also complains of arthritis pain and requests a medication to help. Reports she used to take APAP 500 mg - 2 tabs three times daily and she reports it didn't help.   O:  Physical Exam  Constitutional: She appears well-developed and well-nourished.   Review of Systems  Cardiovascular: Positive for leg swelling (Right leg 2+ pitting, left leg 1+ pitting edema).  Gastrointestinal: Positive for abdominal pain.   Vitals:   04/22/18 1518  BP: (!) 110/54  Pulse: 79  SpO2: 98%    A/P: Medication Review revealed no significant updates or changes.  Appears to understand indications and frequency of all medications.     Denies use of medication box.  At this time, does not appear to desire or need a pill box and she is keenly aware of her use including PRN dosing. Use of alprazolam in geriatric patient undesirable however she is very infrequently using this. Can consider voltaren gel vs acetaminophen re-trial for c/o arthritis pain. Can consider addition of PPI for c/o stomach pain however likely needs further f/u specifically for this issue, asked to reschedule with PCP.  Written patient instructions provided.  Total time in face to face counseling 30 minutes.   Follow up PCP Clinic Visit to discuss complaints further.   Patient seen with Deirdre Pippins, PharmD, BCPS, PGY2 Pharmacy Resident.

## 2018-04-22 NOTE — Patient Instructions (Addendum)
Thank You for coming in today!  Nice to meet you.   Your medications appear to be consistent with our documentation.  I suggest no changes at this time.   Next visit with Dr. Shan Levans in 1-2 months.

## 2018-04-22 NOTE — Assessment & Plan Note (Signed)
Medication Review revealed no significant updates or changes.  Appears to understand indications and frequency of all medications.   Denies use of medication box.  At this time, does not appear to desire or need a pill box and she is keenly aware of her use including PRN dosing. Use of alprazolam in geriatric patient undesirable however she is very infrequently using this. Can consider voltaren gel vs acetaminophen re-trial for c/o arthritis pain. Can consider addition of PPI for c/o stomach pain however likely needs further f/u specifically for this issue, asked to reschedule with PCP.

## 2018-04-27 ENCOUNTER — Other Ambulatory Visit: Payer: Self-pay

## 2018-04-27 ENCOUNTER — Encounter (HOSPITAL_COMMUNITY): Payer: Self-pay

## 2018-04-27 ENCOUNTER — Emergency Department (HOSPITAL_COMMUNITY)
Admission: EM | Admit: 2018-04-27 | Discharge: 2018-04-27 | Disposition: A | Discharge status: Home / Self Care | Payer: Medicare Other | Attending: Emergency Medicine | Admitting: Emergency Medicine

## 2018-04-27 ENCOUNTER — Emergency Department (HOSPITAL_COMMUNITY): Payer: Medicare Other

## 2018-04-27 DIAGNOSIS — I110 Hypertensive heart disease with heart failure: Secondary | ICD-10-CM | POA: Insufficient documentation

## 2018-04-27 DIAGNOSIS — R109 Unspecified abdominal pain: Secondary | ICD-10-CM | POA: Diagnosis not present

## 2018-04-27 DIAGNOSIS — I5030 Unspecified diastolic (congestive) heart failure: Secondary | ICD-10-CM | POA: Diagnosis not present

## 2018-04-27 DIAGNOSIS — K5900 Constipation, unspecified: Secondary | ICD-10-CM | POA: Diagnosis not present

## 2018-04-27 DIAGNOSIS — Z79899 Other long term (current) drug therapy: Secondary | ICD-10-CM | POA: Insufficient documentation

## 2018-04-27 DIAGNOSIS — R1030 Lower abdominal pain, unspecified: Secondary | ICD-10-CM | POA: Diagnosis present

## 2018-04-27 DIAGNOSIS — R350 Frequency of micturition: Secondary | ICD-10-CM | POA: Diagnosis not present

## 2018-04-27 LAB — URINALYSIS, ROUTINE W REFLEX MICROSCOPIC
Bacteria, UA: NONE SEEN
Bilirubin Urine: NEGATIVE
GLUCOSE, UA: NEGATIVE mg/dL
Ketones, ur: NEGATIVE mg/dL
Nitrite: NEGATIVE
PH: 6 (ref 5.0–8.0)
Protein, ur: NEGATIVE mg/dL
SPECIFIC GRAVITY, URINE: 1.009 (ref 1.005–1.030)

## 2018-04-27 LAB — CBC
HCT: 42.3 % (ref 36.0–46.0)
Hemoglobin: 13.5 g/dL (ref 12.0–15.0)
MCH: 30.8 pg (ref 26.0–34.0)
MCHC: 31.9 g/dL (ref 30.0–36.0)
MCV: 96.6 fL (ref 78.0–100.0)
PLATELETS: 205 10*3/uL (ref 150–400)
RBC: 4.38 MIL/uL (ref 3.87–5.11)
RDW: 13.9 % (ref 11.5–15.5)
WBC: 5.6 10*3/uL (ref 4.0–10.5)

## 2018-04-27 LAB — COMPREHENSIVE METABOLIC PANEL
ALK PHOS: 39 U/L (ref 38–126)
ALT: 17 U/L (ref 14–54)
AST: 28 U/L (ref 15–41)
Albumin: 4.1 g/dL (ref 3.5–5.0)
Anion gap: 9 (ref 5–15)
BUN: 34 mg/dL — AB (ref 6–20)
CALCIUM: 9.5 mg/dL (ref 8.9–10.3)
CO2: 30 mmol/L (ref 22–32)
CREATININE: 1.18 mg/dL — AB (ref 0.44–1.00)
Chloride: 105 mmol/L (ref 101–111)
GFR calc Af Amer: 43 mL/min — ABNORMAL LOW (ref >60)
GFR calc non Af Amer: 37 mL/min — ABNORMAL LOW (ref >60)
Glucose, Bld: 100 mg/dL — ABNORMAL HIGH (ref 65–99)
Potassium: 4.1 mmol/L (ref 3.5–5.1)
SODIUM: 144 mmol/L (ref 135–145)
Total Bilirubin: 0.9 mg/dL (ref 0.3–1.2)
Total Protein: 6.9 g/dL (ref 6.5–8.1)

## 2018-04-27 LAB — LIPASE, BLOOD: Lipase: 33 U/L (ref 11–51)

## 2018-04-27 MED ORDER — IOHEXOL 300 MG/ML  SOLN
75.0000 mL | Freq: Once | INTRAMUSCULAR | Status: AC | PRN
  Administered 2018-04-27: 75 mL via INTRAVENOUS

## 2018-04-27 MED ORDER — POLYETHYLENE GLYCOL 3350 17 GM/SCOOP PO POWD: 17.0000 g | Freq: Every day | ORAL | 3 refills | Status: DC | PRN

## 2018-04-27 MED ORDER — DOCUSATE SODIUM 100 MG PO CAPS: 100.0000 mg | ORAL_CAPSULE | Freq: Two times a day (BID) | ORAL | 0 refills | Status: DC | PRN

## 2018-04-27 MED ORDER — IOPAMIDOL (ISOVUE-300) INJECTION 61%: 75.0000 mL | Freq: Once | INTRAVENOUS | Status: DC | PRN

## 2018-04-27 NOTE — ED Notes (Signed)
Patient aware we need urine sample. Patient will call out when ready to void. Patient had already urinated before arrival.

## 2018-04-27 NOTE — ED Provider Notes (Signed)
[facility] DEPT Provider Note   CSN: [id number] Arrival date & time: 04/27/18  1821     History   Chief Complaint Chief Complaint  Patient presents with  . Abdominal Pain  . Weakness    HPI Heather [name] is a 84 y.o. female.  HPI Patient states she has had ongoing abdominal pain for roughly 1 year.  Located on the left side of her abdomen.  Not worsened with eating.  No nausea or vomiting.  States she has regular bowel movements.  No melanotic or grossly bloody stools.  Patient does admit to urinary frequency but denies dysuria. Past Medical History:  Diagnosis Date  . Allergic rhinitis, cause unspecified 02/21/2014  . Anxiety state, unspecified   . Breast cancer ([place])    NO STICK OR BLOOD PRESSURE CHECKS IN LEFT ARM  . Carpal tunnel syndrome   . Cervical spondylosis   . Colon polyp 12/19/2009   Transverse-polypoid colorectal mucosa  . Constipation   . Degenerative joint disease   . Diastolic dysfunction   . Diverticula of colon   . Gastritis, chronic   . Hiatal hernia   . Hypertension   . Low back pain   . RBBB (right bundle branch block with left anterior fascicular block)   . Renal cyst   . Venous insufficiency     Patient Active Problem List   Diagnosis Date Noted  . Back pain 04/08/2018  . Medication management 02/28/2018  . Sacral pain 02/07/2018  . Urinary incontinence without sensory awareness 12/02/2017  . Arthritis of left hip 10/20/2017  . Functional abdominal pain syndrome 09/02/2017  . Encounter for chronic pain management 07/22/2017  . Dementia 06/25/2017  . Vitamin D deficiency 05/21/2017  . Abnormal CT scan of lung 07/16/2015  . Esophageal dysmotility 10/27/2014  . Failure to thrive in adult 04/26/2014  . Arthritis of hand, degenerative 04/10/2014  . Other and unspecified hyperlipidemia 04/10/2014  . LPRD (laryngopharyngeal reflux disease) 04/04/2014  . Chronic bronchitis ([place]) 05/30/2013  . Hx of breast  cancer 08/30/2012  . Edema 06/12/2011  . GERD 03/27/2010  . Diastolic CHF ([place]) 41/66/0630  . Constipation 01/18/2009  . Carpal tunnel syndrome on both sides 10/03/2008  . Cervical spondylosis without myelopathy 03/21/2008  . Diaphragmatic hernia 12/26/2007  . Renal cyst 12/26/2007  . Primary hypertension 09/15/2007  . Venous (peripheral) insufficiency 09/15/2007  . Osteoarthritis 09/15/2007  . Lumbago 09/15/2007  . Diverticulosis of colon 05/24/2002    Past Surgical History:  Procedure Laterality Date  . CARPAL TUNNEL RELEASE    . Left mastectomy       OB History   None      Home Medications    Prior to Admission medications   Medication Sig Start Date End Date Taking? Authorizing Provider  acetaminophen-codeine (TYLENOL #2) 300-15 MG tablet Take 1 tablet by mouth every 4 (four) hours as needed for moderate pain. 03/17/18  Yes Winfrey, Alcario Drought, MD  carvedilol (COREG) 3.125 MG tablet Take 1 tablet (3.125 mg total) by mouth 2 (two) times daily. 09/03/17  Yes Ellington Bing, DO  cholecalciferol (VITAMIN D) 1000 units tablet Take 1,000 Units by mouth daily.   Yes [provider]  famotidine (PEPCID) 20 MG tablet Take 1 tablet (20 mg total) by mouth 2 (two) times daily. 01/04/18  Yes Winfrey, Alcario Drought, MD  feeding supplement, ENSURE ENLIVE, (ENSURE ENLIVE) LIQD Take 237 mLs by mouth 2 (two) times daily between meals. 09/30/16  Yes Nche, Baldo Ash  Lum, NP  furosemide (LASIX) 20 MG tablet Take 1 tablet (20 mg total) by mouth daily. Further refills from new pcp 01/20/18  Yes Nche, Charlene Brooke, NP  Multiple Vitamins-Minerals (MULTIVITAMINS THER. W/MINERALS) TABS Take 1 tablet by mouth daily.   Yes [provider]  senna (SENOKOT) 8.6 MG TABS tablet Take 1 tablet (8.6 mg total) by mouth daily. 04/07/18  Yes Winfrey, Alcario Drought, MD  spironolactone (ALDACTONE) 25 MG tablet Take 1 tablet (25 mg total) by mouth daily. 05/21/17  Yes Donnamae Jude, MD  ALPRAZolam Duanne Moron) 0.25  MG tablet Take 1 tablet (0.25 mg total) by mouth 2 (two) times daily as needed for anxiety. 02/26/18   [name], MD  docusate sodium (COLACE) 100 MG capsule Take 1 capsule (100 mg total) by mouth 2 (two) times daily as needed for mild constipation or moderate constipation. 04/27/18   Julianne Rice, MD  polyethylene glycol powder (GLYCOLAX/MIRALAX) powder Take 17 g by mouth daily as needed. 04/27/18   Julianne Rice, MD    Family History Family History  Problem Relation Age of Onset  . Colon cancer Neg Hx     Social History Social History   Tobacco Use  . Smoking status: Never Smoker  . Smokeless tobacco: Never Used  Substance Use Topics  . Alcohol use: No    Alcohol/week: 0.0 oz  . Drug use: No     Allergies   Fentanyl   Review of Systems Review of Systems  Constitutional: Negative for chills and fever.  HENT: Negative for trouble swallowing.   Respiratory: Negative for cough and shortness of breath.   Cardiovascular: Negative for chest pain.  Gastrointestinal: Positive for abdominal pain. Negative for blood in stool, constipation, diarrhea, nausea and vomiting.  Genitourinary: Positive for frequency. Negative for dysuria, flank pain, hematuria and pelvic pain.  Musculoskeletal: Negative for back pain, myalgias, neck pain and neck stiffness.  Skin: Negative for rash and wound.  Neurological: Negative for dizziness, weakness, light-headedness, numbness and headaches.  All other systems reviewed and are negative.    Physical Exam Updated Vital Signs BP 103/70   Pulse 64   Temp (!) 97.4 F (36.3 C) (Oral)   Resp 15   Wt 53.1 kg (117 lb)   SpO2 100%   BMI 21.40 kg/m   Physical Exam  Constitutional: She is oriented to person, place, and time. She appears well-developed and well-nourished. No distress.  HENT:  Head: Normocephalic and atraumatic.  Mouth/Throat: Oropharynx is clear and moist.  Eyes: Pupils are equal, round, and reactive to light. EOM are  normal.  Neck: Normal range of motion. Neck supple.  Cardiovascular: Normal rate and regular rhythm.  Pulmonary/Chest: Effort normal and breath sounds normal.  Abdominal: Soft. Bowel sounds are normal. There is tenderness. There is no rebound and no guarding.  Left upper quadrant and central abdominal tenderness to palpation.  No rebound or guarding.  Musculoskeletal: Normal range of motion. She exhibits no edema or tenderness.  Mild left-sided CVA tenderness.  No midline thoracic or lumbar tenderness.  1+ bilateral lower extremity edema without asymmetry or tenderness.  Distal pulses intact.  Neurological: She is alert and oriented to person, place, and time.  Moving all extremities without focal deficit.  Sensation intact.  Skin: Skin is warm and dry. No rash noted. She is not diaphoretic. No erythema.  Psychiatric: She has a normal mood and affect. Her behavior is normal.  Nursing note and vitals reviewed.    ED Treatments /  Results  Labs (all labs ordered are listed, but only abnormal results are displayed) Labs Reviewed  COMPREHENSIVE METABOLIC PANEL - Abnormal; Notable for the following components:      Result Value   Glucose, Bld 100 (*)    BUN 34 (*)    Creatinine, Ser 1.18 (*)    GFR calc non Af Amer 37 (*)    GFR calc Af Amer 43 (*)    All other components within normal limits  URINALYSIS, ROUTINE W REFLEX MICROSCOPIC - Abnormal; Notable for the following components:   Color, Urine STRAW (*)    Hgb urine dipstick SMALL (*)    Leukocytes, UA SMALL (*)    All other components within normal limits  LIPASE, BLOOD  CBC    EKG None  Radiology Ct Abdomen Pelvis W Contrast  Result Date: 04/27/2018 CLINICAL DATA:  Abdominal pain for 1 year. Recent weakness. Swelling on the left side. Weight loss. EXAM: CT ABDOMEN AND PELVIS WITH CONTRAST TECHNIQUE: Multidetector CT imaging of the abdomen and pelvis was performed using the standard protocol following bolus administration of  intravenous contrast. CONTRAST:  17mL OMNIPAQUE IOHEXOL 300 MG/ML  SOLN COMPARISON:  12/13/2017 FINDINGS: Lower chest: Aortic valve calcification. Mild cylindrical bronchiectasis in the right lower lobe. Scarring in the left lower lobe. Hepatobiliary: Indistinct 1.1 by 0.6 cm hypodense lesion in the lateral segment left hepatic lobe on image 22/2 with faint associated calcification, similar size on 09/09/2017. Gallbladder unremarkable. Pancreas: Unremarkable Spleen: Unremarkable Adrenals/Urinary Tract: Right kidney upper pole fluid density lesion compatible with cyst. No appreciable renal calculi. Stomach/Bowel: Prominent stool throughout the colon favors constipation. Indistinct margins of the bowel due to paucity of intra-abdominal adipose tissues and lack of oral contrast. There are likely some scattered colonic diverticula. Vascular/Lymphatic: Aortoiliac atherosclerotic vascular disease. No pathologic adenopathy observed. Reproductive: Urine margins indistinct but overall no discrete uterine abnormality is identified. No appreciable adnexal abnormality. Other: No supplemental non-categorized findings. Musculoskeletal: Lumbar spondylosis and degenerative disc disease. Spurring of both hips. IMPRESSION: 1.  Prominent stool throughout the colon favors constipation. 2. Aortic valve calcification. 3. Complex but probably benign lesion in the lateral segment left hepatic lobe with general hypodensity and some internal calcification, not changed from last year. 4. Paucity of intra-adipose tissues and lack of oral contrast cause the bowel to have obscured margins. 5.  Aortic Atherosclerosis (ICD10-I70.0). 6. Lumbar spondylosis and degenerative disc disease. Electronically Signed   By: Van Clines M.D.   On: 04/27/2018 20:37    Procedures Procedures (including critical care time)  Medications Ordered in ED Medications  iopamidol (ISOVUE-300) 61 % injection 75 mL (has no administration in time range)    iohexol (OMNIPAQUE) 300 MG/ML solution 75 mL (75 mLs Intravenous Contrast Given 04/27/18 2010)     Initial Impression / Assessment and Plan / ED Course  I have reviewed the triage vital signs and the nursing notes.  Pertinent labs & imaging results that were available during my care of the patient were reviewed by me and considered in my medical decision making (see chart for details).    Abdominal exam is benign.  CT with evidence of constipation.  Labs are reassuring.  Will start back on MiraLAX and Colace.  Referred to gastroenterology.  Return precautions given.   Final Clinical Impressions(s) / ED Diagnoses   Final diagnoses:  Constipation, unspecified constipation type    ED Discharge Orders        Ordered    polyethylene glycol powder (GLYCOLAX/MIRALAX) powder  Daily PRN     04/27/18 2135    docusate sodium (COLACE) 100 MG capsule  2 times daily PRN     04/27/18 2135       Julianne Rice, MD 04/27/18 2136

## 2018-04-27 NOTE — ED Notes (Signed)
Bed: VO72 Expected date:  Expected time:  Means of arrival:  Comments: [name]

## 2018-04-27 NOTE — ED Triage Notes (Signed)
Pt states abd pain x 1 year. Pt states that she has been feeling more week than normal. Pt states knot on left side. Pt states she has been losing weight. Pt states she lives alone. Pt states she has been going to her PCP but has not gotten any answers

## 2018-05-04 DIAGNOSIS — N3281 Overactive bladder: Secondary | ICD-10-CM | POA: Diagnosis not present

## 2018-05-06 ENCOUNTER — Ambulatory Visit (INDEPENDENT_AMBULATORY_CARE_PROVIDER_SITE_OTHER): Payer: Medicare Other | Admitting: Orthopaedic Surgery

## 2018-05-06 ENCOUNTER — Encounter (INDEPENDENT_AMBULATORY_CARE_PROVIDER_SITE_OTHER): Payer: Self-pay | Admitting: Orthopaedic Surgery

## 2018-05-06 VITALS — BP 111/59 | HR 79 | Ht 60.0 in | Wt 118.0 lb

## 2018-05-06 DIAGNOSIS — M170 Bilateral primary osteoarthritis of knee: Secondary | ICD-10-CM

## 2018-05-06 DIAGNOSIS — M1712 Unilateral primary osteoarthritis, left knee: Secondary | ICD-10-CM

## 2018-05-06 DIAGNOSIS — M1711 Unilateral primary osteoarthritis, right knee: Secondary | ICD-10-CM | POA: Diagnosis not present

## 2018-05-06 MED ORDER — METHYLPREDNISOLONE ACETATE 40 MG/ML IJ SUSP
80.0000 mg | INTRAMUSCULAR | Status: AC | PRN
  Administered 2018-05-06: 80 mg

## 2018-05-06 MED ORDER — BUPIVACAINE HCL 0.5 % IJ SOLN
2.0000 mL | INTRAMUSCULAR | Status: AC | PRN
  Administered 2018-05-06: 2 mL via INTRA_ARTICULAR

## 2018-05-06 MED ORDER — LIDOCAINE HCL 1 % IJ SOLN
2.0000 mL | INTRAMUSCULAR | Status: AC | PRN
  Administered 2018-05-06: 2 mL

## 2018-05-06 NOTE — Progress Notes (Signed)
Office Visit Note   Patient: Heather [name]           Date of Birth: [birth year]           MRN: [id number] Visit Date: 05/06/2018              Requested by: [name], MD [address], [place] [address] PCP: [name], MD   Assessment & Plan: Visit Diagnoses:  1. Primary osteoarthritis of both knees     Plan: Primary osteoarthritis both knees more symptomatic on the right.  Has been 6 months since her last injection.  Will inject today.  Also give her a prescription for a walker with a seat.  Office as needed  Follow-Up Instructions: Return if symptoms worsen or fail to improve.   Orders:  Orders Placed This Encounter  Procedures  . Large Joint Inj: R knee   No orders of the defined types were placed in this encounter.     Procedures: Large Joint Inj: R knee on 05/06/2018 3:48 PM Indications: pain and diagnostic evaluation Details: 25 G 1.5 in needle, anteromedial approach  Arthrogram: No  Medications: 2 mL lidocaine 1 %; 2 mL bupivacaine 0.5 %; 80 mg methylPREDNISolone acetate 40 MG/ML Procedure, treatment alternatives, risks and benefits explained, specific risks discussed. Consent was given by the patient. Immediately prior to procedure a time out was called to verify the correct patient, procedure, equipment, support staff and site/side marked as required. Patient was prepped and draped in the usual sterile fashion.       Clinical Data: No additional findings.   Subjective: Chief Complaint  Patient presents with  . Right Knee - Pain  . Follow-up    R KNEE PAIN FOR YEARS GETTING WORSE, LAST INJECTION 10/2017  Heather [name] is now 84 years old and visited the office for follow-up evaluation of the osteoarthritis right knee.  His last evaluated December with a cortisone injection.  She notes that they do help.  Prior films consistent with osteoarthritis  HPI  Review of Systems  Constitutional: Positive for fatigue.  HENT:  Negative for ear pain.   Eyes: Negative for pain.  Respiratory: Negative for cough and shortness of breath.   Cardiovascular: Positive for leg swelling.  Gastrointestinal: Positive for constipation. Negative for diarrhea.  Genitourinary: Negative for difficulty urinating.  Musculoskeletal: Positive for back pain and neck pain.  Skin: Negative for rash.  Neurological: Positive for weakness and numbness.  Hematological: Bruises/bleeds easily.  Psychiatric/Behavioral: Positive for sleep disturbance.     Objective: Vital Signs: BP (!) 111/59 (BP Location: Right Arm, Patient Position: Sitting, Cuff Size: Normal)   Pulse 79   Ht 5' (1.524 m)   Wt 118 lb (53.5 kg)   BMI 23.05 kg/m   Physical Exam  Ortho Exam awake alert and oriented x3.  Comfortable sitting.  A little hard of hearing.  Only complaining of problems with her stomach as she has for many years right knee with minimal effusion and mostly medial joint pain.  A little opening with varus and valgus stress patient does not complain of instability.  Lacked a few degrees to full extension.  Flexed over 105.  Specialty Comments:  No specialty comments available.  Imaging: No results found.   PMFS History: Patient Active Problem List   Diagnosis Date Noted  . Back pain 04/08/2018  . Medication management 02/28/2018  . Sacral pain 02/07/2018  . Urinary incontinence without sensory awareness 12/02/2017  . Arthritis  of left hip 10/20/2017  . Functional abdominal pain syndrome 09/02/2017  . Encounter for chronic pain management 07/22/2017  . Dementia 06/25/2017  . Vitamin D deficiency 05/21/2017  . Abnormal CT scan of lung 07/16/2015  . Esophageal dysmotility 10/27/2014  . Failure to thrive in adult 04/26/2014  . Arthritis of hand, degenerative 04/10/2014  . Other and unspecified hyperlipidemia 04/10/2014  . LPRD (laryngopharyngeal reflux disease) 04/04/2014  . Chronic bronchitis ([place]) 05/30/2013  . Hx of breast cancer  08/30/2012  . Edema 06/12/2011  . GERD 03/27/2010  . Diastolic CHF ([place]) 22/33/6122  . Constipation 01/18/2009  . Carpal tunnel syndrome on both sides 10/03/2008  . Cervical spondylosis without myelopathy 03/21/2008  . Diaphragmatic hernia 12/26/2007  . Renal cyst 12/26/2007  . Primary hypertension 09/15/2007  . Venous (peripheral) insufficiency 09/15/2007  . Osteoarthritis 09/15/2007  . Lumbago 09/15/2007  . Diverticulosis of colon 05/24/2002   Past Medical History:  Diagnosis Date  . Allergic rhinitis, cause unspecified 02/21/2014  . Anxiety state, unspecified   . Breast cancer ([place])    NO STICK OR BLOOD PRESSURE CHECKS IN LEFT ARM  . Carpal tunnel syndrome   . Cervical spondylosis   . Colon polyp 12/19/2009   Transverse-polypoid colorectal mucosa  . Constipation   . Degenerative joint disease   . Diastolic dysfunction   . Diverticula of colon   . Gastritis, chronic   . Hiatal hernia   . Hypertension   . Low back pain   . RBBB (right bundle branch block with left anterior fascicular block)   . Renal cyst   . Venous insufficiency     Family History  Problem Relation Age of Onset  . Colon cancer Neg Hx     Past Surgical History:  Procedure Laterality Date  . CARPAL TUNNEL RELEASE    . Left mastectomy     Social History   Occupational History  . Occupation: Retired    Fish farm manager: RETIRED  Tobacco Use  . Smoking status: Never Smoker  . Smokeless tobacco: Never Used  Substance and Sexual Activity  . Alcohol use: No    Alcohol/week: 0.0 oz  . Drug use: No  . Sexual activity: Not on file

## 2018-05-17 ENCOUNTER — Telehealth: Payer: Self-pay

## 2018-05-17 NOTE — Telephone Encounter (Signed)
Fax received from [facility] @ [facility] Requesting Ultram 50 mg Tab, Qty of 30. Take 1 tablet by mouth every 8 hours as needed for moderate pain.  Ottis Stain, CMA

## 2018-05-17 NOTE — Telephone Encounter (Signed)
Patient is no longer taking Tramadol, so I will hold off on this refill unless she calls requesting it.  Thanks!

## 2018-05-18 ENCOUNTER — Ambulatory Visit: Payer: Medicare Other | Admitting: Pulmonary Disease

## 2018-05-18 ENCOUNTER — Encounter: Payer: Self-pay | Admitting: Pulmonary Disease

## 2018-05-18 VITALS — BP 116/60 | HR 75 | Temp 98.0°F | Ht 60.0 in | Wt 117.4 lb

## 2018-05-18 DIAGNOSIS — R918 Other nonspecific abnormal finding of lung field: Secondary | ICD-10-CM | POA: Diagnosis not present

## 2018-05-18 DIAGNOSIS — K224 Dyskinesia of esophagus: Secondary | ICD-10-CM | POA: Diagnosis not present

## 2018-05-18 DIAGNOSIS — I5032 Chronic diastolic (congestive) heart failure: Secondary | ICD-10-CM

## 2018-05-18 DIAGNOSIS — K219 Gastro-esophageal reflux disease without esophagitis: Secondary | ICD-10-CM | POA: Diagnosis not present

## 2018-05-18 DIAGNOSIS — J410 Simple chronic bronchitis: Secondary | ICD-10-CM

## 2018-05-18 DIAGNOSIS — K449 Diaphragmatic hernia without obstruction or gangrene: Secondary | ICD-10-CM

## 2018-05-18 DIAGNOSIS — I10 Essential (primary) hypertension: Secondary | ICD-10-CM | POA: Diagnosis not present

## 2018-05-19 ENCOUNTER — Encounter: Payer: Self-pay | Admitting: Pulmonary Disease

## 2018-05-19 NOTE — Progress Notes (Addendum)
Subjective:    Patient ID: Heather [name], female    DOB: [birth year], 84 y.o.   MRN: [id number]  HPI 84 y/o BF here for a follow up visit... she has multiple medical problems including:  Dyspnea & Anxiety;  HBP;  Hx CP & DiastolicCHF followed by DrBensimhon;  HH/ Gastritis;  Divertics/ Constip;  Functional Abd Pain;  Renal cyst followed by DrHerrick;  Hx left breast [phone];  DJD/ CSpine spondy/ LBP/ CTS; and she has a cancer phobia...   ~  SEE PREV EPIC NOTES FOR OLDER DATA >> She has established Primary Care follow up w/ [name] => DrHusain, Leodis Binet...   CXR 10/14 showed normal heart size w/ tortuous atherosclerotic Ao, clear lungs w/ ?vague opac left mid zone adjacent to hilum (nothing seen in this area on CT 3/14) ?overlying shadows & we will f/u...  Ambulatory O2 check 10/14> O2 sat= 98% on room air at rest w/ pulse=83, regular; Ambulated 3 laps w/ lowest O2 sat= 96% w/ pulse=108...  LABS 10/14:  Chems- wnl;  CBC- wnl;  TSH=2.04;  VitD=71;  Sed=11...  ~  Heather [name] has established w/ [name] for Primary care and has seen him x3 already... She still sees DrCrosslry for ENT and DrStark for [name]>>  Baseline CXRs showed sl overinflation, clear lungs, normal heart size w/ elongation & tortuous Ao...  Prev CT Chest/ CTA 3/14 showed no PE, no acute findings, +atherosclerotic changes, several scattered tiny nodules 3-81m size & no change from 2009 scan, s/p left mastectomy, DJD Tspine...   PFTs 9/13 were WNL> FVC=2.62 (128%), FEV1=2.20 (158%), %1sec=84; mid-flows=274% of predicted  CXR 04/04/14 shows stable heart size, atherosclerotic & tortuous Ao, mild hyperinflation, clear lungs w/o acute changes, DJD Tspine...   PFT 07/03/14> despite mult attempts w/ several test administrators/ staff- she was unable to perform the procedure due to inability to cooperate...  Ambulatory O2sat Test 07/03/14 on RA>  Baseline O2sat= 99% w/ pulse=86;  After 3 laps her nadir O2sat= 96% w/ max pulse=  114...  ~  September 21, 2014:  314moOV & pt added-on today at her request for mult symptoms> but on questioning it is apparent that her symptomatology is chronic w/o acute features; she notes cough ("hocking cough"), thick white sputum ("with white balls"), chest congestion; but she denies to me any f/c/s, CP, hemoptysis, etc and she notes that "my breathing is good";;;  Then she launches into a litany of complaints- poor appetite, wt loss ("I'm nothing but a frame"), back pain ("the drawing kind"), and general flight of ideas;  Meds reviewed & include prev ZPak & Mucinex (?taking 1 Qid);  Exam is neg w/ clear chest- no wheezing, rales, or rhonchi hear;  Last CXR was 5/15 showing essentially clear lungs, NAD...    She saw [name] 9/15> Hx HBP, CHF/ DD, AtypCP, RBBB, neg Myoview 2009; on Coreg3.125Bid, Amlod2.5, Lasix40 as needed for swelling; BP= 112/60 today...     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     Hx mult Ortho complaints and LBP evaluated by [name] w/ Epid shots per [name] but she claims no improvement; notes Tramadol doesn't help and want Vicodin refilled, she wears a back brace...     Very anxious and has Celexa10 & Alprazolam 0.45m30mn her med list...   EKG 9/15 showed NSR, rate81, LAD, RBBB, NSSTTWA, NAD...Marland KitchenMarland Kitchen  PLAN>>  [name] is reassured- her breathing is stable w/o acute lung problems at this time; she is asked to continue the Mucinex669m Qid w/ fluids, and remain as active as possible; reminded to use her ProtonixBid & follow antireflux regimen to help her LPR;  She will f/u w/ her other physicians and see me in 6 months for re-evaluation & f/u CXR...  ~  Mar 22, 2015:  6581moOV & Paizlie says "I'm just skin & bones" weight is noted to be down 15# to 120# today (BMI=22);  She says that her dentist noted her  teeth are wearing out & this may acct for some wt loss- needs to incr nutritional supplement like BOOST Bid;  She persists w/ mult medical complaints- "this swallowing business" and she is seeing both [name] and [name] for help w/ reflux and cancer phobia "it can show up anywhere";  From the resp standpoint she notes same "hocking" throat clearing cough w/ thick, white, foamy sput; she notes SOB/DOE w/o change and she is still not taking the Alpraz or the Mucinex as requested...      She saw [name] 1/16> note reviewed & felt to be stable... She tells me [facility]      She saw [name] 1/16 and they felt no further [name] w/u warranted, use abd binder prn...      She saw [name], [name] 2/16> bloating, constip, wt loss> note reviewed EXAM reveals Afeb, VSS, O2sat=99% on RA;  HEENT- neg x sl dry MMs;  Chest- she has a forced dry hacking cough that is nonproductive, clear w/o w/r/r;  Heart- RR w/o m/r/g heard;  Abd- soft, nontender, neg...  We reviewed prob list, meds, xrays and labs> see below for updates >>   LABS 11/2014 were WNL...Marland KitchenMarland KitchenCXR 03/22/15 showed stable cardiomeg, chronic changes, old right rib fxs, NAD...  PLAN>>  Please Cayci take the Mucinex60061mone tab Qid w/ fluids, and try the Alprazolam 0.5mg57m2 tab Tid to start; I have tried to reassure her as best I can, she needs to eat better & take the nutritional supplement Bid...  (NOTE> I spent 25mi61mce-to-face w/ pt discussing her symptoms, examining pt, reviewing recommendations, and answering questions)  ~  July 16, 2015:  81mo R63mo Adira presents w/ her usual litany of somatic complaints- "terrible" she says; c/o feeling hot, eyes run water, had dental work done, still w/ mult [name] issues, unhappy w/ PCP, etc... She saw [name] for [name] eval & his note is reviewed; she went to the ER 05/17/15 w/ [name] complaints and the ER physician ordered a CT Abd & Pelvis> a few tiny 3-4mm no61mes were noted in the lateral  aspect of the RML, the abd revealed aortoiliac calcif w/o aneurysm, right renal cyst, otherw no signif abn noted (Radiology rec f/u CT Chest in 70yr); s19yrs very concerned and I indicated that I thought this was prob some scar tissue but we would recheck her CXR in 58mo & fo29mo up the CT in 70yr...   33yr> she c/o cough "hoking little white balls"; baseline CXRs have been clear (old right rib fxs), PFT in 2013 was wnl, oxygenation is wnl, and exam is clear; she has been asked to use Mucinex Qid but she won't do it...    Abn CT scan w/ scat pulm nodules> prev CT Chest w/ tiny left apical oapc, unchanged serially & felt to be benign; CT Abd done  05/17/15 w/ tiny 3-65m RML nodules detected, otherw eg & we discussed f/u CXR in 639mo CT Chest in 1y2yr   HBP> on Coreg3.125Bid, Lasix40, off Amlod; BP= 106/60 & she persists w/ mult somatic complaints; last 2DEcho 10/2009 showed norm LV size & func w/ EF=55-60%, mildAI....    HxCP> on ASA81; followed by DrBensimhon/ DrNishan for Cards & their prev notes are reviewed=> neg ischemic work up previously...     VI, Edema> she went to the ER 11/13 w/ edema; VDopplers were neg for DVT; they reiterated the recs for no salt, elevation, support hose, & continue Lasix40.     [name]- HH, Gastirits, Divertics, chronic pain & "swelling"> on Protonix40Bid plus Zantac, Miralax & Senakot-S, etc; she persists w/ mult [name] complaints despite prev evals by [facility], [facility], [facility]...    Renal cyst> she is very anxious about everything & has a cancer phobia; DrKimbrough followed this benign simple cyst for yrs- no change...     Hx Breast cancer> she had left mod radical mastectomy 1994 by DrBlievernicht- no known recurrence & she is given reassurance...     Ortho- DJD, Cspine spondy, LBP, CTS> followed by [name] & he injected her right knee & her back; on Vicodin prn...    Anxiety> on Alpraz0.5mg53m but "I'm not taking it very often" she says & encouraged to use more regularly...   We reviewed prob list, meds, xrays and labs> see below for updates >> her PCP is DrHuArtist BeachglAnimal nutritionistTannHuntington Bay Ba swallow 03/23/15 showed espohageal dysmotility w/ full column stasis and tertiart contractions, no stricture detected...  CT Abd & Pelvis 05/17/15 showed a few tiny 3-4mm 3mules were noted in the lateral aspect of the RML, the abd revealed aortoiliac calcif w/o aneurysm, right renal cyst, otherw no signif abn noted (Radiology rec f/u CT Chest in 16yr).48yrBS 05/17/15 via ER> Chems- wnl;  CBC- wnl... IMP/PLAN>>  I told Derenda that the tiny nodules seen in the RML area on CT Abd were likely benign & reassured her that we would follow this area closely w/ CXR in 19mo & 49mohest in 20mo; i20mo meanwhile she will continue her current meds & maintain follow up w/ her PCP & various specialists as needed...   ~  January 17, 2016:  19mo ROV 619morethea notes that her breathing is actually doing very well- SOB at baseline, denies CP, palpit, cough, sput, hemoptysis, etc... However she continues to have a litany of somatic complaints that she enumerates at each visit> she tells me that she has left [name] because he didn't know anything else to do & is now seeing DrHusain-Alphonsa GinnbauNutriosos [name]... "I'm noting but skin & bones", "foood don't do me no good", "my appetite is good & I eat soul food", "I know what's going on w/ me" but she didn't elaborate, "some meds don't agree w/ others", last med was $400 she says (from Urology), "there's nothing in my stomach except my inards", she was upset w/ AVS showing diagnosis of anxiety...     AB> she c/o cough "hoking little white balls"; baseline CXRs have been clear (old right rib fxs), PFT in 2013 was wnl, oxygenation is wnl, and exam is clear; she has been asked to use Mucinex Qid but she won't do it...    Abn CT scan w/ scat pulm nodules> prev CT Chest w/ tiny left apical opac, unchanged serially & felt to be benign; CT Abd done  05/17/15 w/  tiny 3-43m RML nodules detected, otherw neg & we discussed f/u CXR in 6109mo CT Chest in 1y65yr   HxCP> on ASA81; followed by DrBensimhon/ DrNishan for Cards & their prev notes are reviewed=> neg ischemic work up previously...     [name]- HH, Gastirits, Divertics, chronic pain & "swelling"> on Protonix40Bid plus Zantac, Miralax & Senakot-S, etc; she persists w/ mult [name] complaints despite prev evals by [facility], [facility], [facility]; most recent testing 09/2015 by [name]- see imaging studies below;  ENT eval by DrJRosen...    Mult Medical Issues> now followed by PCP DrHusain EXAM reveals Afeb, VSS, O2sat=99% on RA;  HEENT- neg x sl dry MMs;  Chest- she has a forced dry hacking cough that is nonproductive, clear w/o w/r/r;  Heart- RR w/o m/r/g heard;  Abd- soft, nontender, neg...   EKG 08/2015>  NSR, rate74, RBBB, otherw neg ekg...  CXR 09/2105>  Borderline heart size, tortuous Ao, clear sl hyperexpanded lungs, no adenopathy- NAD...   LABS in Epic 09/2015>  Chems- wnl;  CBC- wnl  Abd Sonar 11/2015> heterogeneous liver texture- rec MRI for further eval...  MRI Abd 11/2015> no suspicious hepatic lesions, incidental tiny cyst noted, otherw MRI of Abd was neg...   MBS 11/2015> mod oral phase dysphagia, see full report IMP/PLAN>>  Karmin is stable from the pulm standpoint;  She has mult somatic complaints & a large physician contingent attending to her various symptoms;  I suggested to her that she would be best served by taking the Xanax regularly 0.5mg59m/2 to 1 tab Tid regularly...   ~  May 28, 2016:  50mo 31mo& Shaneil's breathing is at baseline but she complained to her PCP about DOE w/ walking (uses walker) & he told her "I better get it checked out";  Today c/o "I'm just a frame- all skin & bones", "I stay hungry", "I eat soul food & I can't eat no more", "they've done a lot of tests and can't find nothing"; rambling hx & flight of ideas- she is c/o dental issues, ribs sticking out, & still c/o a  knot in her left flank area-- I reminded her to be sure to mention all this to her [name] (EdwaOletta Lamastho (WhitDurward FortesCP (HusaLysle Rubenshe can check this out;  We talked about her abd/ back/ legs/ skin/ etc, she wants a stong pain pill, she says she's lonely at 89- "1l my friend's are gone"...    From the pulmonary standpoint-- she c/o being winded after long walks (uses walke- note: she had outpt PT 01/2016), cough w/ little white balls, etc;  Meds include Flonase Qhs and Mucinex600Bid w/ fluids;  Exam is clear & XRay/Labs in the last yr all wnl...  EXAM reveals Afeb, VSS, O2sat=98% on RA;  HEENT- neg x sl dry MMs;  Chest- she has a forced dry hacking cough that is nonproductive, clear w/o w/r/r;  Heart- RR w/o m/r/g heard;  Abd- soft, nontender, neg...   Art Dopplers of LEs 03/31/16>  Normal ABIs & no evid of arterial occlusive dis  LABS 04/2016 in epic>  Chems- wnl;  CBC- wnl  CT Abd & Pelvis 05/02/16>  Mod stool burden in right colon, no obstruction, NAD (she was seen in the Er for abd pain)  She had Speech Path swallowing eval 05/22/16>  Note reviewed- mild oral phase & pharyngeal dysphagia, no aspiration or penetration, esoph dysmotility; recommendations given to pt...  IMP/PLAN>>  Sula appears stable- chest is  clear, oxygenation excellent, and she presents w/ the same mult somatic complaints; she still won't try the Xanax as I've suggested; f/u w/ me as needed...  ~  December 01, 2016:  27moROV & pulmonary followup visit>  Saphronia [facility]; since I saw her last she has had 5+ PCP office visits and 7+ ER visits for a variety of complaints- divertics, ecchymoses, right arm pain, left groin discomfort;  Today she speaks rapidly w/ pressure of speech (barely taking a breath betw words), flight of ideas and mult somatic complaints... "Too much medicine for my age" "I'm nothing but a frame" she is still convinced there is something in her abd  despite mult evaluations over the yrs....    AB> she c/o cough "hoking little white balls"; baseline CXRs have been clear (old right rib fxs), PFT in 2013 was wnl, oxygenation is wnl, and exam is clear; she has been asked to use Mucinex Qid but she won't do it...    Abn CT scan w/ scat pulm nodules> prev CT Chest w/ tiny left apical opac, unchanged serially & felt to be benign; CT Abd done 05/17/15 w/ tiny 3-432mRML nodules detected, otherw neg & we discussed f/u CXR in 30m38moCT Chest in 40yr540yr  HxCP> on Coreg3.125Bid & Lasix40; BP=110/60 & she has a pos ROS; followed by DrBensimhon/ DrNiCherly Hensen Cards & their prev notes are reviewed=> neg ischemic work up previously...     [name]- HH, Gastirits, Divertics, chronic pain & "swelling"> on Protonix40Bid plus Zantac, Miralax & Senakot-S, etc; she persists w/ mult [name] complaints despite prev evals by [facility], [facility], [facility]; most recent testing 09/2015 by [name]- see imaging studies below;  ENT eval by DrJRosen...    Mult Medical Issues> now followed by PCP-CNche, but she has switched several times... EXAM reveals Afeb, VSS, O2sat=99% on RA;  HEENT- neg x sl dry MMs;  Chest- she has a forced dry hacking cough that is nonproductive, clear w/o w/r/r;  Heart- RR w/o m/r/g heard;  Abd- soft, nontender, neg...   CXR 07/28/16>  Mild cardiomegaly, left basilar atelectasis, distal left clavicular fx & old right post rib fxs...  CT Abd & Pelvis 10/2016>  Lung bases clear, aortic atherosclerosis, neg abd structures and no obstructive uropathy...  LABS 10/2016 in epic> Chems- wnl;  CBC- wnl IMP/PLAN>>  Levette is stable from there pulmonary standpoint; encouraged to use Mucinex & drink plenty of water; needs to be careful & avoid falling!  ~  June 01, 2017:  30mo 43mo& Juanita notes that he breathing is good w/ min chr cough/ phlegm in those small white balls that she describes in detail; reminded to take the Mucinex600mgQ71m/ fluids on a regular dosing  schedule;  Her CC remains her stomach- c/o a knot in her abd that no one has been able to find w/ CT scans etc; "I've got a lot of arthritis" "I'm nothing but skin & bones" "I'm a grown woman & know what hurts me" and she is c/o LLQ/ left side/ left flank pain; "My skin is hanging"; Tawan still does her own meds despite what appears to be a mild dementia...    In the interval betw visits Epic indicates that Holliday has been c/o left hip pain, prev eval by [name] for Ortho, c/o leg pain & swelling w/ PCP at [facility] placing her on Lasix, Aldactone, Coreg for this and HBP, diastolic CHF (  she has seen DrNishan in the past); she has had several visits w/ ER, PCP, ?[name] regarding her left sided abd pain; she switched PCP to [place] their notes are reviewed...  We reviewed the following medical problems during today's office visit >>     AB> she c/o cough "hoking little white balls"; baseline CXRs have been clear (old right rib fxs), PFT in 2013 was wnl, oxygenation is wnl, and exam is clear; she has been asked to use Mucinex Qid but she won't do it...    Abn CT scan w/ scat pulm nodules> prev CT Chest w/ tiny left apical opac, unchanged serially & felt to be benign; CT Abd done 05/17/15 w/ tiny 3-19m RML nodules detected, otherw neg & serial scans showed a 415mRML granuloma otherw NEG & no progressive lung changes...    HxCP> on Coreg3.125Bid & Lasix40; BP=110/60 & she has a pos ROS; followed by DrBensimhon/ [name] & their prev notes are reviewed=> neg ischemic work up previously...     [name]- HH, Gastirits, Divertics, chronic pain & "swelling"> on Protonix40Bid plus Zantac, Miralax & Senakot-S, etc; she persists w/ mult [name] complaints despite prev evals by [facility], [facility], [facility]; most recent testing 09/2015 by [name]- see imaging studies below;  ENT eval by DrJRosen...    Mult Medical Issues> now followed by PCP-CNche, but she has switched several times... EXAM reveals  Afeb, VSS, O2sat=99% on RA;  HEENT- neg x sl dry MMs;  Chest- she has a forced dry hacking cough that is nonproductive, clear w/o w/r/r;  Heart- RR w/o m/r/g heard;  Abd- soft, nontender, neg...  IMP/PLAN>>  Tuyet remains stable from the pulmonary standpoint, her CC remains her enigmatic left sided abd discomfort & "knot"; I hav erec that she continue her Mucinex 60068mid w/ fluids; pulmonary follow up can be prn...      ~  December 07, 2017:  26mo71mo & Hazely notes in her rambling history that her breathing is good but her arthritis is bad, states that the prev left sided knot is gone "it's nothing but skin" she says, states massage at [employer] really helped, and [name] said she needs a physical;  [facility]... From the pulmonary perspective-- she notes min cough, denies much sput & there is no talk about the "little balls of white phlegm" as before, and she denies SOB today- indeed she is talking rapidly, rambling, and barely stops to take a breath...  Review of interval Epic medical records>      [name] f/u by [name] 07/06/17 & 08/05/17>  Generalized abd pain, GERD, dysphagia, constipation; rec to take Miralax, & Protonix, and f/u prn...    Eval [facility] on 09/09/17>  C/o LLQ & left hip pain, noted to be very talkative & flight of ideas, worried that she might have been poisoned;  Labs were ok & UA clear, CT Renal stone protocol showed nonobstructing 2mm 28mt stone, mod fecal burden, DJD Lspine & mild L5 compression (old), aortic atherosclerosis;  Pt reassured, rec to f/u w/ Urology...     [facility]  [facility] She had f/u w/ ORTHO- [name] 11/16/17>  Neck & right knee pain- given knee injection w/ improvement...     [facility] c/o abd pain- she said present for 8-74mo, 60moe- clear, Labs- wnl, they felt  she had diverticulitis (no CT done) &  given Augmentin but it made her dizzy & weak so she stopped it & was given Tramadol for pain...  We reviewed the following medical problems during today's office visit>      AB> she notes min cough & not c/o sput or SOB; baseline CXRs have been clear (old right rib fxs), PFT in 2013 was wnl, oxygenation is wnl, and exam is clear; she has been asked to use Mucinex Qid but she won't do it...    Abn CT scan w/ scat pulm nodules> prev CT Chest w/ tiny left apical opac, unchanged serially & felt to be benign; CT Abd done 05/17/15 w/ tiny 3-36m RML nodules detected, otherw neg & serial scans showed a 431mRML granuloma otherw NEG & no progressive lung changes...    HxCP> on Coreg3.125Bid & Lasix20, & Aldactone25; BP=100/60 & she has a pos ROS; followed by DrBensimhon/ DrNishan for Cards & their prev notes are reviewed=> neg ischemic work up previously...  EXAM reveals Afeb, VSS, O2sat=99% on RA;  HEENT- neg x sl dry MMs;  Chest- she has a forced dry hacking cough that is nonproductive, clear w/o w/r/r;  Heart- RR w/o m/r/g heard;  Abd- soft, nontender, neg...   CXR 12/07/17 (independently reviewed by me in the PACS system) showed borderline heart size, underlying COPD w/ clear lungs- NAD, old right rib deformities noted... IMP/PLAN>>  Babbie is 8920/o and seems to be doing very well overall except for her myriad complaints & concerns; I've always thought that she has a cancer phobia but she is impossible to reassure;  Stable from the pulmonary perspective & has not required antibiotics, inhalers, Pred, Oxygen, etc...  Follow up w/ usKoreaan be prn for breathing problems...     ~  May 18, 2018:  6m42moV & pulmonary follow up visit>  Homer returns without respiratory complaints as her main concerns are weight loss (117# here today- no change over the past 43mo22mod arthritis (DJD followed by DrWhitfiels- s/p shots in her knees);  She notes that her breathing is good, min cough occas w/ the "little white balls of  phlegm", no color, no hemoptysis, no signif CP, and DOE just w/ exertion (uses walker);  She continues to talk non-stop w/ "pressure of speech" and no apparent dyspnea...  "I'm not holding up" "I've gone to pot"  Her PCP is Dr. AWinCandis Schatz pt states "they don't do what I tell them";  [name] told me "nothing else he can do for me" "I eat [place] has more fast in it"  She notes good appetite and "I eat- where it goes is beyond me" We reviewed the following interval Epic medical records>      She saw PCP- DrWinfrey on 04/07/18> c/o back pain, worried about kidney stone & wants referral to Nephrology, hx chr abd pain followed by [name], takes Tylenol#2 prn;  Known DJD on Lumbar spine films, UA w/ +blood & few red cells, C&S was neg- referred to renal but no note in Epic...    She saw PHARM- for med management on 04/22/18> note reviewed, rec to use medication box...    She [facility]> c/o abd pain & weakness- noted sl tender to palpation in abd, Afeb/ VSS; LABS> Chems- ok x BUN=34, Cr=1.18, LFTs wnl & Lipase wnl at 33;  CBC- wnl;  Urine- sm blood, otherw neg;   CT ABD/Pelvis 04/27/18> prom stool burden/ constip;  Ao calcif & AoV calcif, lesion left hepatic lobe w/o ch over the past yr, Lumbar spondy & DDD;  REC to take daily Miralax & Colase, f/u w/ [name]...    She saw ORTHO- [name] on 05/06/18> c/o DJD in knees w/ pain R>L & she received a right knee injection...  We reviewed the following medical problems during today's office visit>      AB> she notes min cough & not c/o sput or SOB; baseline CXRs have been clear (old right rib fxs), PFT in 2013 was wnl, oxygenation is wnl, and exam is clear; she has been asked to use Mucinex Qid but she won't do it...    Abn CT scan w/ scat pulm nodules> prev CT Chest w/ tiny left apical opac, unchanged serially & felt to be benign; serial scans showed a 35m RML granuloma otherw NEG & no progressive lung changes...    HxCP> on Coreg3.125Bid &  Lasix20, & Aldactone25; BP=116/60 & she has a pos ROS; followed by DrBensimhon/ DrNishan for Cards & their prev notes are reviewed=> neg ischemic work up previously...  EXAM reveals Afeb, VSS, O2sat=98% on RA;  HEENT- neg x sl dry MMs;  Chest- she has a forced dry hacking cough that is nonproductive, clear w/o w/r/r;  Heart- RR w/o m/r/g heard;  Abd- soft, nontender, neg...  IMP/PLAN>>  Flara really does not have any pulmonary complaints of note;  We reviewed her Labs and XRays, rec continue same meds + regular exercise program...           Problem List:  RESPIRATORY SYMPTOMS >> c/o "hocking phlegm" DYSPNEA (ICD-786.9) - eval 5/11 c/o SOB- can't get a DB, can't get enough air in, etc... occurs at rest & w/ exerc "I'm very active despite my arthritis", talks rapidly in long sentences w/o apparent distress... no cough, phlegm, hemoptysis, etc- but as usual she is quite distressed by these symptoms and wants further eval> we discussed re-assessment w/ CXR (clear, NAD);  PFT (totally norm airflow);  Labs (all WNL);  Rx w/ ALPRAZOLAM 0.'5mg'$  Tid & encouraged to take regularly. ~  CXR 4/13 showed norm heart size, clear lungs, mild TSpine spondylosis... ~  She continues to complain of drainage, throat symptoms, etc; Rx w/ Mucinex, OTC antihist, Nasonex, etc;  Seen by ENT, DrRosen w/ ?xerostomia & rec incr water etc. ~  9/13: Add-on visit for "hocking, it's not a cough, it's a hock"> see above... ~  9/13: CXR shows sl overinflation, clear lungs, normal heart size w/ elongation & tortuous Ao;  PFTs are wnl> FVC=2.62 (128%), FEV1=2.20 (158%), %1sec=84; mid-flows=274% of predicted... We discussed trial rx for her symptoms- Mucinex, Fluids, Hycodan, Dulera100- 1puffbid. ~  10/13: [facility] & mouth symptoms "like glue" therefore stopped this med; advised to continue the others. ~  3/14:  CT Chest (done after call from Ortho PA regarding her symptoms)=> then CTA (done via an ER visit)  showed no PE, no acute findings, +atherosclerotic changes, several scattered tiny nodules 3-464msize & no change from 2009 scan, s/p left mastectomy, DJD Tspine...  ~  4/14: she is asked to [facility]'600mg'$  [facility]... ~  10/14: CXR showed normal heart size w/ tortuous atherosclerotic Ao, clear lungs w/ ?vague opac left mid zone adjacent to hilum (nothing seen in this area on CT 3/14) ?overlying shadows & we will f/u...  ~  CXR 04/04/14 shows stable heart size, atherosclerotic & tortuous Ao, mild  hyperinflation, clear lungs w/o acute changes, DJD Tspine... ~  8/15 & 11/15> she remains stable from the pulmonary standpoint on Mucinex600Qid + Fluids; rec to follow better antireflux regimen for her cough, LPR, choking...  ~  CXR 03/22/15 showed stable cardiomeg, chronic changes, old right rib fxs, NAD ~  She went to the ER 05/17/15 w/ [name] complaints and the ER physician ordered a CT Abd & Pelvis> a few tiny 3-63m nodules were noted in the lateral aspect of the RML, the abd revealed aortoiliac calcif w/o aneurysm, right renal cyst, otherw no signif abn noted (Radiology rec f/u CT Chest in 110yr she is very concerned and I indicated that I thought this was prob some scar tissue but we would recheck her CXR in 25m67mofollow up the CT in 44yr58yr [facility] [employer] w/ , , , DrMcMullen & DrWinfrey @ [facility]>   HYPERTENSION (ICD-401.9) -  ~  on AMLODIPINE 2.'5mg'$ /d,  COREG 3.'125mg'$ Bid,  LASIX '40mg'$ - ?taking 1/2tab/d or just using it prn swelling... ~  8/16: her meds have been adjusted> on Coreg3.125Bid, Lasix40, off Amlod; BP= 106/60 & she persists w/ mult somatic complaints; last 2DEcho 10/2009 showed norm LV size & func w/ EF=55-60%, mildAI.  Hx of CHEST PAIN (ICD-786.50) - she takes ASA '81mg'$ /d + Tylenol Prn... followed by DrBensimhon for Cards. ~  Baseline EKG w/ RBBB & LAD, no acute changes... ~  NuclearStressTest done 2007 & 2009 showed normal-  without scar or ischemia, EF=67%...   ~  2DEcho 7/08 showed mild AI, mild diastolic dysfunction, EF=5IP=38-25% CT Abd 1/09 & 9/12 showed incidental coronary calcif, & aorto-iliac atherosclerotic changes as well... ~  8/13: she saw DrNishan> HBP, DD, atypCP w/ neg Myoviews & no changes made... ~  10/13:  She uses a back brace/ abd binder to help her CWP... ~  EKG 9/15 showed NSR, rate81, LAD, RBBB, NSSTTWA, NAD...  VENOUS INSUFFICIENCY (ICD-459.81) - she has mod VI and follows a low sodium diet, elevates legs when able, and wears support hose. ~  7/12:  C/o incr edema & we reviewed a 2gm sodium diet & increased her Lasix from '20mg'$ /d to '40mg'$ /d... ~  5/13:  She has persist complaints but mild chr VI w/ min edema & reminded to elim sodium, elev, support hose, take the Lasix... ~ 11/13: she went to the ER w/ edema; VDopplers were neg for DVT; they reiterated the recs for no salt, elevation, support hose, & continue Lasix40. ~  She has persistent edema in LEs and rec to avoid sodium, elev legs, wear support hose...  HIATAL HERNIA (ICD-553.3) & GASTRITIS (ICD-535.50) >>  ~  on OMEPRAZOLE '20mg'$ /d & followed by Heather [name] for [name] having fired DrStark & [facility] in past. ~  last EGD was 2/08 w/ 3cm HH & gastitis... ~  CT Abd 1/09 revealed calcif gallstones, & mild ectasia of AA... ~  CT Abd 1/11 showed left colon divertics, tiny hepatic lesions w/o change & right renal cyst stable. ~  CT Abd 9/12 showed divertics (no inflamm), right renal & left hep lobe cysts, atherosclerotic changes, NAD... ~  Seen by Heather [name] 4/13> c/o dysphagia & congestion; he notes mult [name] complaints, known esoph dysmotility w/o stricture, hard to pin down her complaints... ~  She has followed up w/ DrStark regarding her concerns about swelling in her abd... ~  CT Abd Pelvis 3/14 showed Tiny cysts in the lateral segment left hepatic lobe, 1.4 cm right  upper pole renal cyst, atherosclerotic calcifications of the abdominal aorta and branch  vessels, DJD spine... ~  2015: now on Protonix40Bid but not taking regularly & advised BID dosing & antireflux regimen for her LPR...   DIVERTICULOSIS, COLON (ICD-562.10) - followed regularly by DrStark. ~  colonoscopy 7/03 by DrStark showing divertics only... we discussed Miralax + Senakot-S for constipation. ~  colonoscopy 2/11 showed divertics, 55m polyp, hems...  ? of ABDOMINAL PAIN & SYMPTOMATOLOGY - she had a very thorough work up in 2009> she has a cancer phobia and is very difficult to reassure her that everything appropriate has been done & no cancer found... Mult additional [name] evals since then from [place] [name], [facility] [name] (Heather [name]), [facility] [name] (DrThorne)... ~  eval by [name]- DrStark 12/08 but pt wasn't satisfied w/ his examination... ~  full labs 1/09 were WNL, sed=20... ~  CT Abd/ Pelvis 1/09= no acute abn in abd (calcif gallstones & mild ectasia of AA), calcif fibroid in the pelvis, & ?regarding the left ovary. ~  referral to DrFontaine, GYN> his notes are reviewed... ~  follow up w/ DrKimbrough, Urology> his notes are reviewed... ~  eval Heather [name] for Orthopedics- [facility]. ~  repeat [name] eval 1/11 by DrStark w/ CT & colonoscpy as above- no acute problems. ~  5/11 & 7/11 >> persist complaints w/ neg recheck by DrStark & DrKimbrough... ~  12/11:  routine GYN surveillance DrFontaine found anteverted uterus w/ fibroids, atrophic ovaries bilat, no mass. ~  Repeat [name] eval 4/12 by DrStark> he tried to reassure the pt, felt some symptoms might be from poor abd wall muscle tone, rec continue Omep, laxatives... ~  6/12:  She sought another [name] opinion Heather [name]> he did not feel she needed any additional testing... ~  She tells me she has yet another appt to see a gastroenterologist at [facility]==> seen by DrThorne 8/12 & note reviewed... ~  EGD by DrThorne 9/12 w/ mult whitish nodular plaques in cardia & fundus> Bx results pending & she is very concerned (remember  her hx cancer phobia). ~  [facility]... ~  [facility] back [facility]/ [facility]Stark [name] => [name] [place] [place] [name]Marland Kitchen  ~  CT Abd & Pelvis 05/17/15 via ER showed a few tiny 3-420mnodules were noted in the lateral aspect of the RML, the abd revealed aortoiliac calcif w/o aneurysm, right renal cyst, otherw no signif abn noted (Radiology rec f/u CT Chest in 1y55yr RENAL CYST (ICD-593.2) - she has a 1 cm right renal cyst seen on CT in 2007 and followed by DrKimbrough... f/u CT Abd 1/09 - no change in the cyst... f/u renal ultrasound 1/10 w/ small bilat cysts... f/u CT Abd 1/11- no change in cyst. ~  she saw DrKimbrough 7/11 for LBP, microhematuria, urge incontinence- stable, no additional Rx. ~  she saw DrKimbrough 1/12 for urge incontinence, freq, nocturia> rec Desmopressin 0.'2mg'$ - 1/2 tab Qhs. ~  No change in the renal cyst over the yrs...  CARCINOMA, BREAST, LEFT (ICD-174.9) - Surgery 9/94 by DrBlievernicht w/ Left modified radical mastectomy... +estrogen receptors and Rx w/ Tamoxifen... f/u mammograms all neg... breast exam on right has been neg- no nodules palpated... She says that DrBertrand insisted that her PCP check her right breast at each & every office visit to be sure she doesn't develop any nodules...  DEGENERATIVE JOINT DISEASE (ICD-715.90) -  on OTC meds + VICODIN Prn... she has end-stage OA of Right Knee per Heather [name] treated w/ shots in the past... she has a knee brace and a cane for ambulation... ~  8/13:  She wants a better pain pill & we discussed change to [place] prn... ~  12/13: she tells me that Heather [name] injected her knee last wk- improved...  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 Rx w/ rest, heat, Percocet, and f/u w/ Ortho...  Hx of LOW BACK PAIN, CHRONIC (ICD-724.2) - eval Heather [name] w/ rec for shots by Heather [name] & she's  feeling sl better & wears back brace... ~  5/10: mult somatic complaints- primarily c/o left side/ postero-lat rib pain...  Exam w/ tender left lat/ post ribs... Bone Scan= neg... ~  MRI Lumbar spine 7/10 by Heather [name]... ~  Epidural steroid shot by Heather [name] 8/10 & 1/11 ~  Repeat LBP eval by Heather [name] w/ another MRI lumbar spine showing degenerative changes w/ mild progression since 7/10 MRI, mild-mod sp stenosis & lat recess stenosis at several levels ~  Heather [name] referred her to [facility] to consider more shots but she is not so inclined... ~  6/13: she saw DrTooke w/ lumbar spondylosis; he felt that he had nothing to offer her & rec incr Tramadol to Qid... ~  2014:  She has had numerous visits w/ Heather [name]/ Ernestina Patches for injections, XRays, scans etc...  CARPAL TUNNEL SYNDROME, BILATERAL (ICD-354.0) - she had prev right carpal tunnel release by Heather [name] and was c/o increasing discomfort in her left wrist despite wrist splint Qhs- had left carpal tunnel release 1/10 by Heather [name]...  ANXIETY (ICD-300.00) - on ALPRAZOLAM 0.'5mg'$  Tid & encouraged to take it regularly... she has a cancer phobia & it is very difficult to reassure her regarding her symptoms and our evaluations... ~  7/13: she went to the ER c/o swelling of the tongue but eval by EDP indicated that her tongue was normal & not swollen, she was not on an ACE, given Pred Rx anyway. ~  She has been asked to incr the ALprazolam to regular dosing but she is reluctant...   Past Surgical History:  Procedure Laterality Date  . CARPAL TUNNEL RELEASE    . Left mastectomy      Outpatient Encounter Medications as of 05/18/2018  Medication Sig  . acetaminophen-codeine (TYLENOL #2) 300-15 MG tablet Take 1 tablet by mouth every 4 (four) hours as needed for moderate pain.  Marland Kitchen ALPRAZolam (XANAX) 0.25 MG tablet Take 1 tablet (0.25 mg total) by mouth 2 (two) times daily as needed for anxiety.  . carvedilol (COREG) 3.125 MG tablet Take 1 tablet  (3.125 mg total) by mouth 2 (two) times daily.  . cholecalciferol (VITAMIN D) 1000 units tablet Take 1,000 Units by mouth daily.  Marland Kitchen docusate sodium (COLACE) 100 MG capsule Take 1 capsule (100 mg total) by mouth 2 (two) times daily as needed for mild constipation or moderate constipation.  . famotidine (PEPCID) 20 MG tablet Take 1 tablet (20 mg total) by mouth 2 (two) times daily.  . feeding supplement, ENSURE ENLIVE, (ENSURE ENLIVE) LIQD Take 237 mLs by mouth 2 (two) times daily between meals.  . furosemide (LASIX) 20 MG tablet Take 1 tablet (20 mg total) by mouth daily. Further refills from new pcp  . Multiple Vitamins-Minerals (MULTIVITAMINS THER. W/MINERALS) TABS Take 1 tablet by mouth daily.  . polyethylene glycol powder (GLYCOLAX/MIRALAX) powder Take 17 g by mouth daily  as needed.  . senna (SENOKOT) 8.6 MG TABS tablet Take 1 tablet (8.6 mg total) by mouth daily.  Marland Kitchen spironolactone (ALDACTONE) 25 MG tablet Take 1 tablet (25 mg total) by mouth daily.   No facility-administered encounter medications on file as of 05/18/2018.     Allergies  Allergen Reactions  . Fentanyl Nausea Only and Other (See Comments)    Pt states that she is not allergic  Nausea only.     Current Medications, Allergies, Past Medical History, Past Surgical History, Family History, and Social History were reviewed in [employer] record.  IMMUNIZATION Hx is not complete & she is reminded to discuss w/ her PCP to be sure she is up-to-date on her indicated vaccinations...    Review of Systems       See HPI - all other systems neg except as noted... She has mult somatic complaints and anxiety. The patient complains of decreased hearing, chest discomfort, dyspnea on exertion, abdominal pain, incontinence, muscle weakness, and difficulty walking.  The patient denies anorexia, fever, vision loss, hoarseness, syncope, peripheral edema, prolonged cough, headaches, hemoptysis, melena, hematochezia, severe  indigestion/heartburn, hematuria, transient blindness, depression, abnormal bleeding, enlarged lymph nodes, and angioedema.     Objective:   Physical Exam    WD, WN, 84 y/o BF in NAD... she is very anxious... GENERAL:  Alert & oriented; pleasant & cooperative... HEENT:  [place]/AT, EOM-wnl, PERRLA, EACs-clear, TMs-wnl, NOSE-clear, THROAT-clear & wnl, no lesions seen. NECK:  Neck ROM decreased, no JVD; normal carotid impulses w/o bruits; no thyromegaly or nodules palpated; no lymphadenopathy. CHEST:  Clear to P & A; without wheezes/ rales/ or rhonchi heard... BREAST:  s/p left mastectomy... right breast normal- without nodules palpated... HEART:  Regular Rhythm; without murmurs/ rubs/ or gallops detected... ABDOMEN:  Soft & nontender; normal bowel sounds; no organomegaly or masses palpated... EXT: without deformities, mod arthritic changes; no varicose veins/ +venous insuffic/ tr edema. NEURO:  CN's intact; no focal neuro deficits... DERM:  Small sl excoriated area of dermatitis left shin- no drainage,no rash, etc...  RADIOLOGY DATA:  Reviewed in the EPIC EMR & discussed w/ the patient...  LABORATORY DATA:  Reviewed in the EPIC EMR & discussed w/ the patient...   Assessment & Plan:    RESPIRATORY SYMPTOMS w/ Dyspnea & throat clearing, prob LPR>  She had a neg pulm eval 2011 and numerous times thereafter; she insisted on additional eval 9/13 for her chr throat clearing symptom; CXR w/o acute changes & PFTs normal for her 84 y/o lungs; she is quite insistent on med rx for her problem but has not had relief from anything; REC to take [id number], Fluids, DELSYM prn; pt very anxious & Alprazolam seems to help when she takes it... 11/15> she has been taking the OTC MUCINEX '600mg'$  Qid w/ fluids and seems sl improved; rec strict antireflux regimen w/ Protonix before dinner, NPO after dinner, elev HOB 6" blocks... 5/16 - 8/16> she is once again off all these meds and c/o incr symptoms; rec to restart  the Mucinex, fluids, and antireflux regimen... 01/17/16>   Halsey is stable from the pulm standpoint;  She has mult somatic complaints & a large physician contingent attending to her various symptoms;  I suggested to her that she would be best served by taking the Xanax regularly 0.'5mg'$ - 1/2 to 1 tab Tid regularly (she never did). 05/28/16>   Dayja appears stable- chest is clear, oxygenation excellent, and she presents w/ the same mult somatic complaints; she still  won't try the Xanax as I've suggested; f/u w/ me as needed... 12/01/16>   Pasha is stable from there pulmonary standpoint; encouraged to use Mucinex & drink plenty of water; needs to be careful & avoid falling! 06/01/17>   Jasamine remains stable from the pulmonary standpoint, her CC remains her enigmatic left sided abd discomfort & "knot"; I hav erec that she continue her Mucinex '600mg'$  Qid w/ fluids; pulmonary follow up can be prn... 12/07/17>   Sharicka is 84 y/o and seems to be doing very well overall except for her myriad complaints & concerns; I've always thought that she has a cancer phobia but she is impossible to reassure;  Stable from the pulmonary perspective & has not required antibiotics, inhalers, Pred, Oxygen, etc...  Follow up w/ [place] can be prn for breathing problems...  05/18/18>   Marquelle really does not have any pulmonary complaints of note;  We reviewed her Labs and XRays, rec continue same meds + regular exercise program.   Abn CT Abd w/ several tiny nodules reported in the RML area>  We discussed this & decided to f/u CXR & CT scans => no serial changes, stable...   Hx CP> evaluated for Cards by DrBensimon & he has not been in favor of invasive testing; stable on conservative med rx...  [name]> HH, Gastritis, Divertics, vague abd pain>  I tried once again to reassure her about the thorough [name] evaluations that her has undergone; she is happy w/ DrThorne's eval & will f/u w/ her & in the meanwhile continue rx w/ Prilosec40,  Miralax, Metamucil, Senakot, Mylicon, etc... She has re-established w/ [place] [name]- their notes reviewed...  Hx left Breast Cancer>  No known recurrence...  Anxiety>  Asked once again to increase the Alprazolam to 1/2 - 1 tab TID regularly...   Patient's Medications  New Prescriptions   No medications on file  Previous Medications   ACETAMINOPHEN-CODEINE (TYLENOL #2) 300-15 MG TABLET    Take 1 tablet by mouth every 4 (four) hours as needed for moderate pain.   ALPRAZOLAM (XANAX) 0.25 MG TABLET    Take 1 tablet (0.25 mg total) by mouth 2 (two) times daily as needed for anxiety.   CARVEDILOL (COREG) 3.125 MG TABLET    Take 1 tablet (3.125 mg total) by mouth 2 (two) times daily.   CHOLECALCIFEROL (VITAMIN D) 1000 UNITS TABLET    Take 1,000 Units by mouth daily.   DOCUSATE SODIUM (COLACE) 100 MG CAPSULE    Take 1 capsule (100 mg total) by mouth 2 (two) times daily as needed for mild constipation or moderate constipation.   FAMOTIDINE (PEPCID) 20 MG TABLET    Take 1 tablet (20 mg total) by mouth 2 (two) times daily.   FEEDING SUPPLEMENT, ENSURE ENLIVE, (ENSURE ENLIVE) LIQD    Take 237 mLs by mouth 2 (two) times daily between meals.   FUROSEMIDE (LASIX) 20 MG TABLET    Take 1 tablet (20 mg total) by mouth daily. Further refills from new pcp   MULTIPLE VITAMINS-MINERALS (MULTIVITAMINS THER. W/MINERALS) TABS    Take 1 tablet by mouth daily.   POLYETHYLENE GLYCOL POWDER (GLYCOLAX/MIRALAX) POWDER    Take 17 g by mouth daily as needed.   SENNA (SENOKOT) 8.6 MG TABS TABLET    Take 1 tablet (8.6 mg total) by mouth daily.   SPIRONOLACTONE (ALDACTONE) 25 MG TABLET    Take 1 tablet (25 mg total) by mouth daily.  Modified Medications   No medications on file  Discontinued  Medications   No medications on file

## 2018-05-19 NOTE — Patient Instructions (Signed)
Today we updated your med list in our EPIC system...    Continue your current medications the same...  We reviewed your interval medical history, exam, XRays, Lab work, etc...   You are not currently requiring any breathing meds and your lungs are clear to auscultation...  We discussed using OTC Mucinex 600mg  one tab up to 4 times daily w/ fluids for any cough or thick mucus...  Watch for any discolored mucus that might indicate a bronchial infection and prompt [place] to use an antibiotic if nec...  Call for any questions or if we can be of service in any way...   Try to relax and take slow deep breaths.Marland KitchenMarland Kitchen

## 2018-06-07 ENCOUNTER — Ambulatory Visit: Payer: Medicare Other | Admitting: Pulmonary Disease

## 2018-06-08 ENCOUNTER — Other Ambulatory Visit: Payer: Self-pay | Admitting: Nurse Practitioner

## 2018-06-08 ENCOUNTER — Other Ambulatory Visit: Payer: Self-pay | Admitting: Family Medicine

## 2018-06-08 DIAGNOSIS — I872 Venous insufficiency (chronic) (peripheral): Secondary | ICD-10-CM

## 2018-06-08 DIAGNOSIS — I5033 Acute on chronic diastolic (congestive) heart failure: Secondary | ICD-10-CM

## 2018-06-14 DIAGNOSIS — C50912 Malignant neoplasm of unspecified site of left female breast: Secondary | ICD-10-CM | POA: Diagnosis not present

## 2018-06-15 ENCOUNTER — Ambulatory Visit: Payer: Medicare Other | Admitting: Family Medicine

## 2018-06-15 ENCOUNTER — Encounter: Payer: Self-pay | Admitting: Family Medicine

## 2018-06-15 ENCOUNTER — Other Ambulatory Visit: Payer: Self-pay

## 2018-06-15 VITALS — BP 114/56 | HR 73 | Temp 98.4°F | Ht 60.0 in | Wt 121.8 lb

## 2018-06-15 DIAGNOSIS — I5032 Chronic diastolic (congestive) heart failure: Secondary | ICD-10-CM | POA: Diagnosis not present

## 2018-06-15 DIAGNOSIS — M1612 Unilateral primary osteoarthritis, left hip: Secondary | ICD-10-CM

## 2018-06-15 MED ORDER — SULINDAC 150 MG PO TABS: 75.0000 mg | ORAL_TABLET | Freq: Every day | ORAL | 0 refills | Status: DC

## 2018-06-15 NOTE — Patient Instructions (Signed)
.  It was a pleasure to see you today! Thank you for choosing [facility] for your primary care. Heather [name] was seen for left hip arthritis pain. Come back to the clinic to see me on Monday, and go to the emergency room if you have any life threatening symptoms.  Today I wrote for some sulindac for pain, take half a tablet each day when the pain gets the worst and we can talk about it on Monday.  I also was you to take an extra lasix pill for 3 days, we can recheck your fluid status on Monday when you come to see me.    If we did any lab work today that did not result today, one of two things will happen.  1. If everything is normal, you will get a letter in mail sent to the address in your chart with the results for your records.  It is important to keep your address up to date as that is where we will send results.  2. If the results require some sort of discussion, my nurses or myself will call you on the phone number listed in your records.  It is important to keep your phone number up to date in our system as this is how we will try to reach you.  If we cannot reach you on the phone, we will try to send you a letter in the mail so please enable to voicemail function of your phone.  If you don't hear from [place] in two weeks, please give [place] a call to verify your results. Otherwise, we look forward to seeing you again at your next visit. If you have any questions or concerns before then, please call the clinic at [phone].   Please bring all your medications to every doctors visit   Sign up for My Chart to have easy access to your labs results, and communication with your Primary care physician.     Please check-out at the front desk before leaving the clinic.     Best,  Dr. Sherene Sires FAMILY MEDICINE RESIDENT - PGY2 06/15/2018 4:35 PM

## 2018-06-16 ENCOUNTER — Other Ambulatory Visit: Payer: Self-pay

## 2018-06-16 DIAGNOSIS — I5033 Acute on chronic diastolic (congestive) heart failure: Secondary | ICD-10-CM

## 2018-06-16 DIAGNOSIS — I872 Venous insufficiency (chronic) (peripheral): Secondary | ICD-10-CM

## 2018-06-17 MED ORDER — FUROSEMIDE 20 MG PO TABS: 20.0000 mg | ORAL_TABLET | Freq: Every day | ORAL | 3 refills | Status: DC

## 2018-06-17 NOTE — Assessment & Plan Note (Signed)
Patient with 2+ edema in legs but no respiratory symptoms at this time.  Advised to take an extra lasix for 3 days only then return to normal regimen.  Come in on Monday to recheck creatinine and evaluate status.  Given poor candidacy for interventional procedures and reaonable clinical appearance, no echo  Plan discussed with patient and son to help avoid confusion

## 2018-06-17 NOTE — Assessment & Plan Note (Signed)
Patient with chronic arthritis pain, positive for pain with internal/external rotation of hips bilaterally  Offered low dose sulindac at patient's request, will see doctor Monday to discuss results

## 2018-06-17 NOTE — Progress Notes (Signed)
    Subjective:  Heather [name] is a 84 y.o. female who presents to the [facility] today with a chief complaint of left hip pain.   HPI: Patient presents c/o left hip pain.  She says she knows she has arthritis and it hurts when she walks for too long (she uses a walker).  No recents falls/injuries, just chronic pain.  She takes only her prescribed meds and nothing illicit.  She does not want "any of that heavy medicine" because it makes her sleepy and "feel bad".  She did bring in an old bottle of prescript sulindac and she says that it used to help her just fine, she can't remember why she stopped getting it prescribed.    She mentions no other complaints but on exam I noticed edema in her legs.  She states her legs "stay swollen" and that it doesn't hurt.  She has no orthopnea complaints, no increasing SOB and no urinary complaints.   Objective:  Physical Exam: BP (!) 114/56   Pulse 73   Temp 98.4 F (36.9 C) (Oral)   Ht 5' (1.524 m)   Wt 121 lb 12.8 oz (55.2 kg)   SpO2 98%   BMI 23.79 kg/m   Gen: NAD, resting comfortably CV: RRR with 3/6 murmur, 2+ pitting edema Pulm: NWOB, CTAB with no crackles, wheezes, or rhonchi GI: Normal bowel sounds present. Soft, Nontender, Nondistended. MSK: no edema, cyanosis, or clubbing noted, significant kyphosis, walks with walker, positive for pain on internal/external rotation of hip bilaterally. Skin: warm, dry Neuro: grossly normal, moves all extremities Psych: Normal affect and thought content for age, occasionally needs redirection in conversation but competent  No results found for this or any previous visit (from the past 72 hour(s)).   Assessment/Plan:  Arthritis of left hip Patient with chronic arthritis pain, positive for pain with internal/external rotation of hips bilaterally  Offered low dose sulindac at patient's request, will see doctor Monday to discuss results  Diastolic CHF [facility]) Patient with 2+ edema in legs but no  respiratory symptoms at this time.  Advised to take an extra lasix for 3 days only then return to normal regimen.  Come in on Monday to recheck creatinine and evaluate status.  Given poor candidacy for interventional procedures and reaonable clinical appearance, no echo  Plan discussed with patient and son to help avoid confusion   Sherene Sires, DO FAMILY MEDICINE RESIDENT - PGY2 06/17/2018 8:47 AM

## 2018-06-18 IMAGING — RF DG SWALLOWING FUNCTION
1 series · 1 of 1 positions shown · non-contrast
Comparison: 11/30/2015.

CLINICAL DATA: [AGE] female with history of dysphagia.

EXAM:
MODIFIED BARIUM SWALLOW
TECHNIQUE: Different consistencies of barium were administered orally to the
patient by the Speech Pathologist. Imaging of the pharynx was
performed in the lateral projection.
FLUOROSCOPY TIME:  Radiation Exposure Index (as provided by the
fluoroscopic device): 4.38 mGy

[Series 1: run · 1 of 1 slices shown]
[im 1/1]
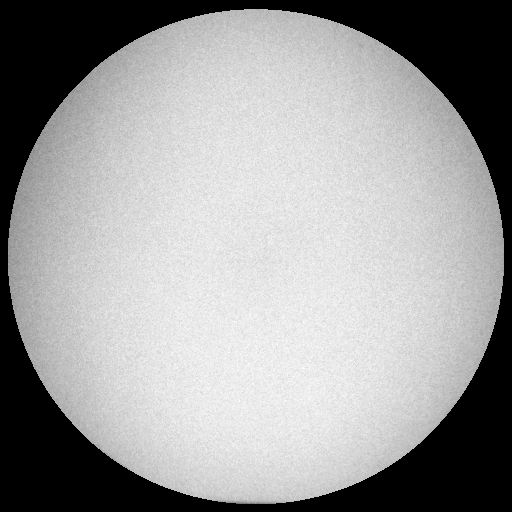

[1 of 1 positions shown; findings below may reference images not displayed]

FINDINGS: Thin liquid- within normal limits

Nectar thick liquid- within normal limits

Jess?Danii within normal limits

Jess?Gkhai with cracker- within normal limits
IMPRESSION: Swallow study within normal limits.

Please refer to the Speech Pathologists report for complete details
and recommendations.

## 2018-06-21 ENCOUNTER — Ambulatory Visit: Payer: Medicare Other | Admitting: Family Medicine

## 2018-06-21 ENCOUNTER — Encounter: Payer: Self-pay | Admitting: Family Medicine

## 2018-06-21 ENCOUNTER — Other Ambulatory Visit: Payer: Self-pay

## 2018-06-21 VITALS — BP 104/58 | HR 77 | Temp 98.1°F | Wt 121.0 lb

## 2018-06-21 DIAGNOSIS — R609 Edema, unspecified: Secondary | ICD-10-CM | POA: Diagnosis not present

## 2018-06-21 DIAGNOSIS — I5032 Chronic diastolic (congestive) heart failure: Secondary | ICD-10-CM

## 2018-06-21 DIAGNOSIS — M545 Low back pain, unspecified: Secondary | ICD-10-CM

## 2018-06-21 DIAGNOSIS — M1612 Unilateral primary osteoarthritis, left hip: Secondary | ICD-10-CM

## 2018-06-21 NOTE — Patient Instructions (Signed)
It was a pleasure to see you today! Thank you for choosing [facility] for your primary care. Heather [name] was seen for leg swelling and hip/back pain. Come back to the clinic if you have any routine concerns, and go to the emergency room if you have any life threatening symptoms.  Today we took some blood tests for your kidney and heart.  I will call you with the results and try and work out the referal for the kidney doctor.  Come back to meet Dr. Shan Levans when you can.  If we did any lab work today that did not result today, one of two things will happen.  1. If everything is normal, you will get a letter in mail sent to the address in your chart with the results for your records.  It is important to keep your address up to date as that is where we will send results.  2. If the results require some sort of discussion, my nurses or myself will call you on the phone number listed in your records.  It is important to keep your phone number up to date in our system as this is how we will try to reach you.  If we cannot reach you on the phone, we will try to send you a letter in the mail so please enable to voicemail function of your phone.  If you don't hear from [place] in two weeks, please give [place] a call to verify your results. Otherwise, we look forward to seeing you again at your next visit. If you have any questions or concerns before then, please call the clinic at [phone].   Please bring all your medications to every doctors visit   Sign up for My Chart to have easy access to your labs results, and communication with your Primary care physician.     Please check-out at the front desk before leaving the clinic.     Best,  Dr. Sherene Sires FAMILY MEDICINE RESIDENT - PGY2 06/21/2018 4:21 PM

## 2018-06-22 LAB — BASIC METABOLIC PANEL
BUN/Creatinine Ratio: 24 (ref 12–28)
BUN: 23 mg/dL (ref 10–36)
CO2: 26 mmol/L (ref 20–29)
Calcium: 9.4 mg/dL (ref 8.7–10.3)
Chloride: 100 mmol/L (ref 96–106)
Creatinine, Ser: 0.97 mg/dL (ref 0.57–1.00)
GFR calc Af Amer: 56 mL/min/{1.73_m2} — ABNORMAL LOW (ref >59)
GFR calc non Af Amer: 49 mL/min/{1.73_m2} — ABNORMAL LOW (ref >59)
Glucose: 93 mg/dL (ref 65–99)
Potassium: 4.4 mmol/L (ref 3.5–5.2)
Sodium: 142 mmol/L (ref 134–144)

## 2018-06-22 LAB — BRAIN NATRIURETIC PEPTIDE: BNP: 122.5 pg/mL — AB (ref 0.0–100.0)

## 2018-06-22 NOTE — Progress Notes (Signed)
    Subjective:  Heather [name] is a 84 y.o. female who presents to the [facility] today with a chief complaint of f/u from last clinic visit for edema and hip arthritis pain.   HPI: Edema Patient in for lab followup from prior visit.  She was told to take 3 days of extra dose of 20 furosemide.  She reports that she instead has been taking 20 furosemide 3x/day.  But when pressed further verbally it seems unsure if she accurately remembers her medication doses.  Arthritis pain: the suldinac did not help a lot and made her feel like she had an upset stomach so she does not want to use it the tylenol/codeine is her preferred medicine.  Pain is unchanged from prior, pain with internal/external femur rotation. No urinary changes although patient is positive the pain is kidney related. She had nephrology referal that was closed and she wants to know why it didn't happen.  Objective:  Physical Exam: BP (!) 104/58   Pulse 77   Temp 98.1 F (36.7 C) (Oral)   Wt 121 lb (54.9 kg)   SpO2 97%   BMI 23.63 kg/m   Gen: NAD, resting comfortably, pleasant and talkative CV: RRR with 2/6 murmurs appreciated Pulm: NWOB, CTAB with no crackles, wheezes, or rhonchi GI: Soft, Nontender, Nondistended. MSK: 2+ piting edema bilaterally.  Significant kyphosis, walks with walker.  Pain with internal/external femur rotation on left. Skin: warm, dry Neuro: grossly normal, moves all extremities Psych: Normal affect and thought content  Results for orders placed or performed in visit on 06/21/18 (from the past 72 hour(s))  Basic Metabolic Panel     Status: Abnormal   Collection Time: 06/21/18  4:28 PM  Result Value Ref Range   Glucose 93 65 - 99 mg/dL   BUN 23 10 - 36 mg/dL   Creatinine, Ser 0.97 0.57 - 1.00 mg/dL   GFR calc non Af Amer 49 (L) >59 mL/min/1.73   GFR calc Af Amer 56 (L) >59 mL/min/1.73   BUN/Creatinine Ratio 24 12 - 28   Sodium 142 134 - 144 mmol/L   Potassium 4.4 3.5 - 5.2 mmol/L   Chloride 100  96 - 106 mmol/L   CO2 26 20 - 29 mmol/L   Calcium 9.4 8.7 - 10.3 mg/dL  Brain natriuretic peptide     Status: None (Preliminary result)   Collection Time: 06/21/18  4:28 PM  Result Value Ref Range   BNP WILL FOLLOW      Assessment/Plan:  Diastolic CHF (HCC) No improvement to edema after an unreliable report of extra lasix doses.  No lung symptoms  Check BNP  Lumbago Given internal/external femur rotation pain this is likely a hip symptom  Edema Question if patient actually correctly took the prescribed extra doses of furosemide, edema still 2+.  Will check creatinine and BNP.  Patient also insistent that she needs a kidney doctor, prior referral cancelled and she wants it put back in.  Will check with our referral coordinator.  Arthritis of left hip Sulindac did not help.  Prefers tylenol/codeine.  Pain with internal/external left femur rotation.  Can ambulate at baseline with walker   Sherene Sires, [place] - PGY2 06/22/2018 8:58 AM\

## 2018-06-22 NOTE — Assessment & Plan Note (Signed)
Sulindac did not help.  Prefers tylenol/codeine.  Pain with internal/external left femur rotation.  Can ambulate at baseline with walker

## 2018-06-22 NOTE — Assessment & Plan Note (Signed)
Given internal/external femur rotation pain this is likely a hip symptom

## 2018-06-22 NOTE — Assessment & Plan Note (Signed)
No improvement to edema after an unreliable report of extra lasix doses.  No lung symptoms  Check BNP

## 2018-06-22 NOTE — Assessment & Plan Note (Signed)
Question if patient actually correctly took the prescribed extra doses of furosemide, edema still 2+.  Will check creatinine and BNP.  Patient also insistent that she needs a kidney doctor, prior referral cancelled and she wants it put back in.  Will check with our referral coordinator.

## 2018-06-24 ENCOUNTER — Telehealth: Payer: Self-pay | Admitting: *Deleted

## 2018-06-24 NOTE — Telephone Encounter (Signed)
Patient's son calling to check on status of recent labs done on Monday (06/21/18) to check on kidneys.  Would like someone to call patient with results.  Will route request to Dr. Criss Rosales.  Burna Forts, BSN, RN-BC

## 2018-06-29 DIAGNOSIS — M545 Low back pain: Secondary | ICD-10-CM | POA: Diagnosis not present

## 2018-06-29 DIAGNOSIS — R269 Unspecified abnormalities of gait and mobility: Secondary | ICD-10-CM | POA: Diagnosis not present

## 2018-06-29 DIAGNOSIS — R1032 Left lower quadrant pain: Secondary | ICD-10-CM | POA: Diagnosis not present

## 2018-07-08 DIAGNOSIS — R1032 Left lower quadrant pain: Secondary | ICD-10-CM | POA: Diagnosis not present

## 2018-07-13 DIAGNOSIS — C50912 Malignant neoplasm of unspecified site of left female breast: Secondary | ICD-10-CM | POA: Diagnosis not present

## 2018-07-15 ENCOUNTER — Other Ambulatory Visit: Payer: Self-pay

## 2018-07-15 ENCOUNTER — Ambulatory Visit: Payer: Medicare Other | Admitting: Family Medicine

## 2018-07-15 VITALS — BP 120/60 | HR 73 | Temp 98.7°F | Ht 60.0 in | Wt 121.0 lb

## 2018-07-15 DIAGNOSIS — I872 Venous insufficiency (chronic) (peripheral): Secondary | ICD-10-CM | POA: Diagnosis not present

## 2018-07-15 DIAGNOSIS — N3942 Incontinence without sensory awareness: Secondary | ICD-10-CM

## 2018-07-15 NOTE — Assessment & Plan Note (Signed)
Will stop furosemide since it has not been effective for Heather [name], and it would be beneficial to reduce patient's number of medications.  Discussed timed voiding, with urination about every 2 hours during the day to reduce the amount of urine loss between trips to the bathroom.  Also discussed pelvic floor rehab, and patient says that she would like to ask her urologist about this.  Discussed that patient will likely continue to have nocturnal incontinence.

## 2018-07-15 NOTE — Patient Instructions (Addendum)
It was nice seeing you today Ms. Casimir!  Please try to urinate every two hours during the day to reduce the amount of urination you're having between bathroom trips.  Please buy support stockings to reduce your leg swelling and keep your legs elevated as much as possible.  Please STOP FUROSEMIDE today.  Ask your urologist about a referral to pelvic floor rehab.  I would like to see you back in three months.  If you have any questions or concerns, please feel free to call the clinic.   Be well,  Dr. Shan Levans

## 2018-07-15 NOTE — Progress Notes (Signed)
Subjective:    Heather [name] - 84 y.o. female MRN [id number]  Date of birth: 1918/08/25  HPI  Heather [name] is here to discuss her urinary incontinence and pedal edema.  Urinary incontinence Continually soaks depends both during the day and night.  She says that her incontinence is better described as urination almost all the time rather than leaking.  This is a complaint that this patient has had almost every visit I have seen her, so it is a significant problem in her life.  She does see a urologist named Dr. Lerry Liner concerning this issue.  She takes furosemide 20 mg once in the morning and once midday as well as spironolactone 25 mg daily.  Pedal edema This is also a chronic problem for this patient, and she was recently seen by Dr. Criss Rosales, who recommended increasing her furosemide dose from 20 mg to 40 mg daily as described above.  Patient says that the increase in furosemide has done nothing to reduce her edema.  She denies any skin breakdown.  She does wear support stockings.  Health Maintenance:  -Patient declines pneumonia and flu shots today due to feeling poorly Health Maintenance Due  Topic Date Due  . TETANUS/TDAP  06[birth year]13/1937    -  reports that she has never smoked. She has never used smokeless tobacco. - Review of Systems: Per HPI. - Past Medical History: Patient Active Problem List   Diagnosis Date Noted  . Back pain 04/08/2018  . Medication management 02/28/2018  . Sacral pain 02/07/2018  . Urinary incontinence without sensory awareness 12/02/2017  . Arthritis of left hip 10/20/2017  . Functional abdominal pain syndrome 09/02/2017  . Encounter for chronic pain management 07/22/2017  . Dementia 06/25/2017  . Vitamin D deficiency 05/21/2017  . Abnormal CT scan of lung 07/16/2015  . Esophageal dysmotility 10/27/2014  . Failure to thrive in adult 04/26/2014  . Arthritis of hand, degenerative 04/10/2014  . Other and unspecified hyperlipidemia 04/10/2014  . LPRD  (laryngopharyngeal reflux disease) 04/04/2014  . Chronic bronchitis ([place]) 05/30/2013  . Hx of breast cancer 08/30/2012  . Edema 06/12/2011  . GERD 03/27/2010  . Diastolic CHF ([place]) 55/37/4827  . Constipation 01/18/2009  . Carpal tunnel syndrome on both sides 10/03/2008  . Cervical spondylosis without myelopathy 03/21/2008  . Diaphragmatic hernia 12/26/2007  . Renal cyst 12/26/2007  . Primary hypertension 09/15/2007  . Venous (peripheral) insufficiency 09/15/2007  . Osteoarthritis 09/15/2007  . Lumbago 09/15/2007  . Diverticulosis of colon 05/24/2002   - Medications: reviewed and updated   Objective:   Physical Exam BP 120/60   Pulse 73   Temp 98.7 F (37.1 C) (Oral)   Ht 5' (1.524 m)   Wt 121 lb (54.9 kg)   SpO2 98%   BMI 23.63 kg/m  Gen: NAD, alert, cooperative with exam, appears well for age CV: RRR, good S1/S2, no murmur, 2+ pedal edema bilaterally Resp: CTABL, no wheezes, non-labored Abd: Globally mildly tender to palpation, BS present, no guarding or organomegaly Extremities: 2+ pedal edema, no skin breakdown Psych: Appears to be familiar with her medications, but has periods of confusion during conversation    Assessment & Plan:   Urinary incontinence without sensory awareness Will stop furosemide since it has not been effective for Heather [name], and it would be beneficial to reduce patient's number of medications.  Discussed timed voiding, with urination about every 2 hours during the day to reduce the amount of urine loss between trips to the bathroom.  Also discussed pelvic floor rehab, and patient says that she would like to ask her urologist about this.  Discussed that patient will likely continue to have nocturnal incontinence.  Venous (peripheral) insufficiency Discussed with patient that she will likely continue to have some venous insufficiency due to the age of her veins and the resulting loss of function of the valves in her veins.  Recommended that patient  buy new support stockings and elevate her feet as much as possible.  Also recommended that patient examine her legs daily to make sure that she does not have any skin breakdown.    Maia Breslow, M.D. 07/15/2018, 2:42 PM PGY-2, [place]

## 2018-07-15 NOTE — Assessment & Plan Note (Signed)
Discussed with patient that she will likely continue to have some venous insufficiency due to the age of her veins and the resulting loss of function of the valves in her veins.  Recommended that patient buy new support stockings and elevate her feet as much as possible.  Also recommended that patient examine her legs daily to make sure that she does not have any skin breakdown.

## 2018-07-20 ENCOUNTER — Encounter (HOSPITAL_COMMUNITY): Payer: Self-pay | Admitting: Emergency Medicine

## 2018-07-20 ENCOUNTER — Other Ambulatory Visit: Payer: Self-pay

## 2018-07-20 ENCOUNTER — Emergency Department (HOSPITAL_COMMUNITY)
Admission: EM | Admit: 2018-07-20 | Discharge: 2018-07-20 | Disposition: A | Discharge status: Home / Self Care | Payer: Medicare Other | Attending: Emergency Medicine | Admitting: Emergency Medicine

## 2018-07-20 ENCOUNTER — Emergency Department (HOSPITAL_BASED_OUTPATIENT_CLINIC_OR_DEPARTMENT_OTHER): Payer: Medicare Other

## 2018-07-20 DIAGNOSIS — R609 Edema, unspecified: Secondary | ICD-10-CM

## 2018-07-20 DIAGNOSIS — Z79899 Other long term (current) drug therapy: Secondary | ICD-10-CM | POA: Diagnosis not present

## 2018-07-20 DIAGNOSIS — G8929 Other chronic pain: Secondary | ICD-10-CM | POA: Diagnosis not present

## 2018-07-20 DIAGNOSIS — F0390 Unspecified dementia without behavioral disturbance: Secondary | ICD-10-CM | POA: Insufficient documentation

## 2018-07-20 DIAGNOSIS — R109 Unspecified abdominal pain: Secondary | ICD-10-CM

## 2018-07-20 DIAGNOSIS — I110 Hypertensive heart disease with heart failure: Secondary | ICD-10-CM | POA: Diagnosis not present

## 2018-07-20 DIAGNOSIS — I5032 Chronic diastolic (congestive) heart failure: Secondary | ICD-10-CM | POA: Diagnosis not present

## 2018-07-20 DIAGNOSIS — R1084 Generalized abdominal pain: Secondary | ICD-10-CM | POA: Insufficient documentation

## 2018-07-20 DIAGNOSIS — R600 Localized edema: Secondary | ICD-10-CM | POA: Diagnosis not present

## 2018-07-20 DIAGNOSIS — R2243 Localized swelling, mass and lump, lower limb, bilateral: Secondary | ICD-10-CM | POA: Diagnosis not present

## 2018-07-20 LAB — CBC WITH DIFFERENTIAL/PLATELET
BASOS ABS: 0 10*3/uL (ref 0.0–0.1)
Basophils Relative: 0 %
EOS ABS: 0.1 10*3/uL (ref 0.0–0.7)
Eosinophils Relative: 1 %
HCT: 38.1 % (ref 36.0–46.0)
Hemoglobin: 12.6 g/dL (ref 12.0–15.0)
LYMPHS PCT: 35 %
Lymphs Abs: 1.7 10*3/uL (ref 0.7–4.0)
MCH: 31.3 pg (ref 26.0–34.0)
MCHC: 33.1 g/dL (ref 30.0–36.0)
MCV: 94.5 fL (ref 78.0–100.0)
MONO ABS: 0.5 10*3/uL (ref 0.1–1.0)
Monocytes Relative: 10 %
NEUTROS ABS: 2.5 10*3/uL (ref 1.7–7.7)
Neutrophils Relative %: 54 %
Platelets: 200 10*3/uL (ref 150–400)
RBC: 4.03 MIL/uL (ref 3.87–5.11)
RDW: 13.9 % (ref 11.5–15.5)
WBC: 4.7 10*3/uL (ref 4.0–10.5)

## 2018-07-20 LAB — LIPASE, BLOOD: Lipase: 38 U/L (ref 11–51)

## 2018-07-20 LAB — COMPREHENSIVE METABOLIC PANEL
ALBUMIN: 3.6 g/dL (ref 3.5–5.0)
ALK PHOS: 32 U/L — AB (ref 38–126)
ALT: 17 U/L (ref 0–44)
ANION GAP: 11 (ref 5–15)
AST: 26 U/L (ref 15–41)
BUN: 29 mg/dL — ABNORMAL HIGH (ref 8–23)
CALCIUM: 9.2 mg/dL (ref 8.9–10.3)
CO2: 26 mmol/L (ref 22–32)
Chloride: 107 mmol/L (ref 98–111)
Creatinine, Ser: 1.08 mg/dL — ABNORMAL HIGH (ref 0.44–1.00)
GFR calc Af Amer: 48 mL/min — ABNORMAL LOW (ref >60)
GFR calc non Af Amer: 41 mL/min — ABNORMAL LOW (ref >60)
GLUCOSE: 131 mg/dL — AB (ref 70–99)
Potassium: 4.1 mmol/L (ref 3.5–5.1)
SODIUM: 144 mmol/L (ref 135–145)
Total Bilirubin: 0.9 mg/dL (ref 0.3–1.2)
Total Protein: 6.3 g/dL — ABNORMAL LOW (ref 6.5–8.1)

## 2018-07-20 LAB — URINALYSIS, ROUTINE W REFLEX MICROSCOPIC
Bacteria, UA: NONE SEEN
Bilirubin Urine: NEGATIVE
Glucose, UA: NEGATIVE mg/dL
HGB URINE DIPSTICK: NEGATIVE
KETONES UR: NEGATIVE mg/dL
Nitrite: NEGATIVE
PH: 8 (ref 5.0–8.0)
PROTEIN: NEGATIVE mg/dL
Specific Gravity, Urine: 1.014 (ref 1.005–1.030)

## 2018-07-20 MED ORDER — ACETAMINOPHEN-CODEINE #3 300-30 MG PO TABS
1.0000 | ORAL_TABLET | Freq: Once | ORAL | Status: AC
  Administered 2018-07-20: 1 via ORAL
  Filled 2018-07-20: qty 1

## 2018-07-20 NOTE — ED Triage Notes (Signed)
Pt c/o abd pains that radiates down her legs esp when walking and leg swelling for 5-6 months. Reports "the doctors can't ever find anything wrong".

## 2018-07-20 NOTE — Discharge Instructions (Addendum)
Follow-up with your primary care doctor for further management of your abdominal pain and peripheral edema.

## 2018-07-20 NOTE — ED Provider Notes (Signed)
[facility] DEPT Provider Note   CSN: [id number] Arrival date & time: 07/20/18  0905     History   Chief Complaint Chief Complaint  Patient presents with  . Abdominal Pain  . Leg Pain  . Leg Swelling    HPI Heather [name] is a 84 y.o. female.  HPI Patient presents with abdominal pain and lower extremity swelling.  Has had for several months.  Has been extensively worked up by both PCP and in the ER.  No causes been found.  States that the pain continues.  States she does not like going to [facility] because they cannot figure out the cause.  Pain is dull.  Mostly in her left abdomen.  Does go to the back.  Has had CAT scans without a clear cause found.  Also swelling in both her legs.  Has urinary incontinence and recently had her Lasix decreased.  States Celexa gotten worse since Thursday when that was done.  No difficulty breathing.  States she has urinary incontinence and that is what they were trying to decrease.  States she needs to find out what is wrong.  Both patient's legs are swollen but states right side is more swollen typically. Past Medical History:  Diagnosis Date  . Allergic rhinitis, cause unspecified 02/21/2014  . Anxiety state, unspecified   . Breast cancer ([place])    NO STICK OR BLOOD PRESSURE CHECKS IN LEFT ARM  . Carpal tunnel syndrome   . Cervical spondylosis   . Colon polyp 12/19/2009   Transverse-polypoid colorectal mucosa  . Constipation   . Degenerative joint disease   . Diastolic dysfunction   . Diverticula of colon   . Gastritis, chronic   . Hiatal hernia   . Hypertension   . Low back pain   . RBBB (right bundle branch block with left anterior fascicular block)   . Renal cyst   . Venous insufficiency     Patient Active Problem List   Diagnosis Date Noted  . Back pain 04/08/2018  . Medication management 02/28/2018  . Sacral pain 02/07/2018  . Urinary incontinence without sensory awareness 12/02/2017  . Arthritis of  left hip 10/20/2017  . Functional abdominal pain syndrome 09/02/2017  . Encounter for chronic pain management 07/22/2017  . Dementia 06/25/2017  . Vitamin D deficiency 05/21/2017  . Abnormal CT scan of lung 07/16/2015  . Esophageal dysmotility 10/27/2014  . Failure to thrive in adult 04/26/2014  . Arthritis of hand, degenerative 04/10/2014  . Other and unspecified hyperlipidemia 04/10/2014  . LPRD (laryngopharyngeal reflux disease) 04/04/2014  . Chronic bronchitis ([place]) 05/30/2013  . Hx of breast cancer 08/30/2012  . Edema 06/12/2011  . GERD 03/27/2010  . Diastolic CHF ([place]) 70/17/7939  . Constipation 01/18/2009  . Carpal tunnel syndrome on both sides 10/03/2008  . Cervical spondylosis without myelopathy 03/21/2008  . Diaphragmatic hernia 12/26/2007  . Renal cyst 12/26/2007  . Primary hypertension 09/15/2007  . Venous (peripheral) insufficiency 09/15/2007  . Osteoarthritis 09/15/2007  . Lumbago 09/15/2007  . Diverticulosis of colon 05/24/2002    Past Surgical History:  Procedure Laterality Date  . CARPAL TUNNEL RELEASE    . Left mastectomy       OB History   None      Home Medications    Prior to Admission medications   Medication Sig Start Date End Date Taking? Authorizing Provider  acetaminophen-codeine (TYLENOL #2) 300-15 MG tablet Take 1 tablet by mouth every 4 (four) hours as needed for  moderate pain. 03/17/18  Yes Winfrey, Alcario Drought, MD  ALPRAZolam Duanne Moron) 0.25 MG tablet Take 1 tablet (0.25 mg total) by mouth 2 (two) times daily as needed for anxiety. 02/26/18  Yes Winfrey, Alcario Drought, MD  carvedilol (COREG) 3.125 MG tablet Take 1 tablet (3.125 mg total) by mouth 2 (two) times daily. 09/03/17  Yes [place] Bing, DO  cholecalciferol (VITAMIN D) 1000 units tablet Take 1,000 Units by mouth daily.   Yes [provider]  docusate sodium (COLACE) 100 MG capsule Take 1 capsule (100 mg total) by mouth 2 (two) times daily as needed for mild constipation or moderate  constipation. 04/27/18  Yes Julianne Rice, MD  famotidine (PEPCID) 20 MG tablet Take 1 tablet (20 mg total) by mouth 2 (two) times daily. 01/04/18  Yes Winfrey, Alcario Drought, MD  feeding supplement, ENSURE ENLIVE, (ENSURE ENLIVE) LIQD Take 237 mLs by mouth 2 (two) times daily between meals. 09/30/16  Yes Nche, Charlene Brooke, NP  furosemide (LASIX) 20 MG tablet Take 20 mg by mouth daily.   Yes [provider]  levocetirizine (XYZAL) 5 MG tablet Take 5 mg by mouth every evening. 05/18/18  Yes [provider]  Multiple Vitamins-Minerals (MULTIVITAMINS THER. W/MINERALS) TABS Take 1 tablet by mouth daily.   Yes [provider]  polyethylene glycol powder (GLYCOLAX/MIRALAX) powder Take 17 g by mouth daily as needed. 04/27/18  Yes Julianne Rice, MD  senna (SENOKOT) 8.6 MG TABS tablet Take 1 tablet (8.6 mg total) by mouth daily. 04/07/18  Yes [name], MD  spironolactone (ALDACTONE) 25 MG tablet TAKE 1 TABLET BY MOUTH ONCE DAILY 06/08/18  Yes Donnamae Jude, MD    Family History Family History  Problem Relation Age of Onset  . Colon cancer Neg Hx     Social History Social History   Tobacco Use  . Smoking status: Never Smoker  . Smokeless tobacco: Never Used  Substance Use Topics  . Alcohol use: No    Alcohol/week: 0.0 standard drinks  . Drug use: No     Allergies   Fentanyl   Review of Systems Review of Systems  Constitutional: Negative for appetite change.  HENT: Negative for congestion.   Respiratory: Negative for shortness of breath.   Cardiovascular: Positive for leg swelling.  Gastrointestinal: Positive for abdominal pain. Negative for nausea and vomiting.  Genitourinary: Negative for flank pain.  Musculoskeletal: Positive for back pain.  Skin: Negative for rash.  Neurological: Negative for weakness.  Hematological: Negative for adenopathy.  Psychiatric/Behavioral: Negative for confusion.     Physical Exam Updated Vital Signs BP 122/75  (BP Location: Right Arm)   Pulse 71   Temp 98.1 F (36.7 C) (Oral)   Resp 18   SpO2 98%   Physical Exam  Constitutional: She appears well-developed.  HENT:  Head: Normocephalic.  Eyes: EOM are normal.  Cardiovascular: Regular rhythm.  Pulmonary/Chest: Breath sounds normal.  Abdominal: Normal appearance.  Skin: Skin is warm. Capillary refill takes less than 2 seconds.  Edema bilateral lower extremities, worse on right.  No erythema.     ED Treatments / Results  Labs (all labs ordered are listed, but only abnormal results are displayed) Labs Reviewed  COMPREHENSIVE METABOLIC PANEL - Abnormal; Notable for the following components:      Result Value   Glucose, Bld 131 (*)    BUN 29 (*)    Creatinine, Ser 1.08 (*)    Total Protein 6.3 (*)    Alkaline Phosphatase 32 (*)  GFR calc non Af Amer 41 (*)    GFR calc Af Amer 48 (*)    All other components within normal limits  URINALYSIS, ROUTINE W REFLEX MICROSCOPIC - Abnormal; Notable for the following components:   Leukocytes, UA SMALL (*)    All other components within normal limits  CBC WITH DIFFERENTIAL/PLATELET  LIPASE, BLOOD    EKG None  Radiology No results found.  Procedures Procedures (including critical care time)  Medications Ordered in ED Medications  acetaminophen-codeine (TYLENOL #3) 300-30 MG per tablet 1 tablet (1 tablet Oral Given 07/20/18 1209)     Initial Impression / Assessment and Plan / ED Course  I have reviewed the triage vital signs and the nursing notes.  Pertinent labs & imaging results that were available during my care of the patient were reviewed by me and considered in my medical decision making (see chart for details).     Patient with acute on chronic abdominal pain and leg swelling.  Work-up reassuring.  Has been worked up for the same in the past.  Will need to follow with PCP.  Lab work today overall reassuring  Final Clinical Impressions(s) / ED Diagnoses   Final diagnoses:    Chronic abdominal pain  Peripheral edema    ED Discharge Orders    None       Davonna Belling, MD 07/20/18 1526

## 2018-07-20 NOTE — ED Notes (Signed)
ED Provider at bedside. 

## 2018-07-20 NOTE — Progress Notes (Signed)
*  Preliminary Results* Right lower extremity venous duplex completed. Right lower extremity is negative for deep vein thrombosis. There is no evidence of right Baker's cyst.  07/20/2018 11:07 AM  Abram Sander

## 2018-07-23 ENCOUNTER — Telehealth: Payer: Self-pay

## 2018-07-23 ENCOUNTER — Other Ambulatory Visit: Payer: Self-pay | Admitting: Family Medicine

## 2018-07-23 NOTE — Telephone Encounter (Signed)
Called and left a VM about patient's leg swelling.  I told her that as long as her legs were not painful or she was not having trouble breathing, her leg swelling was not dangerous.  If she was experiencing those symptoms, she should call and make an appointment to be seen.  This is a chronic problem for her, and we recently stopped Lasix because she did not feel that it was helping, and she was urinating too frequently.  However, we can always restart the Lasix if she feels like this would be helpful.

## 2018-07-23 NOTE — Telephone Encounter (Signed)
Patient left message that she continues to have problem with legs swelling. Stated she went to the ED Tuesday and no cause was found and she was told to f/u with PCP. Wants to know if PCP has any advice for her after reviewing ED note.   Call back is [phone]  Danley Danker, RN [facility] [place])

## 2018-07-27 ENCOUNTER — Other Ambulatory Visit: Payer: Self-pay

## 2018-07-27 ENCOUNTER — Emergency Department (HOSPITAL_COMMUNITY)
Admission: EM | Admit: 2018-07-27 | Discharge: 2018-07-28 | Disposition: A | Discharge status: Home / Self Care | Payer: Medicare Other | Attending: Emergency Medicine | Admitting: Emergency Medicine

## 2018-07-27 ENCOUNTER — Encounter (HOSPITAL_COMMUNITY): Payer: Self-pay | Admitting: Family Medicine

## 2018-07-27 DIAGNOSIS — Z79899 Other long term (current) drug therapy: Secondary | ICD-10-CM | POA: Diagnosis not present

## 2018-07-27 DIAGNOSIS — R1030 Lower abdominal pain, unspecified: Secondary | ICD-10-CM | POA: Insufficient documentation

## 2018-07-27 DIAGNOSIS — K5900 Constipation, unspecified: Secondary | ICD-10-CM | POA: Diagnosis not present

## 2018-07-27 DIAGNOSIS — R300 Dysuria: Secondary | ICD-10-CM

## 2018-07-27 NOTE — ED Triage Notes (Signed)
Patient reports she has been experiencing increase urinary frequency for the last 2-3 months. Also, complains of cramping in her lower extremities with increased swelling. Denies shortness of breath except on exertion.

## 2018-07-28 ENCOUNTER — Telehealth: Payer: Self-pay

## 2018-07-28 ENCOUNTER — Encounter (HOSPITAL_COMMUNITY): Payer: Self-pay

## 2018-07-28 ENCOUNTER — Emergency Department (HOSPITAL_COMMUNITY): Payer: Medicare Other

## 2018-07-28 DIAGNOSIS — R1030 Lower abdominal pain, unspecified: Secondary | ICD-10-CM | POA: Diagnosis not present

## 2018-07-28 LAB — CBC
HEMATOCRIT: 40.1 % (ref 36.0–46.0)
HEMOGLOBIN: 13.1 g/dL (ref 12.0–15.0)
MCH: 31.7 pg (ref 26.0–34.0)
MCHC: 32.7 g/dL (ref 30.0–36.0)
MCV: 97.1 fL (ref 78.0–100.0)
Platelets: 190 10*3/uL (ref 150–400)
RBC: 4.13 MIL/uL (ref 3.87–5.11)
RDW: 14 % (ref 11.5–15.5)
WBC: 5.7 10*3/uL (ref 4.0–10.5)

## 2018-07-28 LAB — URINALYSIS, ROUTINE W REFLEX MICROSCOPIC
BACTERIA UA: NONE SEEN
BILIRUBIN URINE: NEGATIVE
GLUCOSE, UA: NEGATIVE mg/dL
Ketones, ur: NEGATIVE mg/dL
LEUKOCYTES UA: NEGATIVE
NITRITE: NEGATIVE
PH: 6 (ref 5.0–8.0)
Protein, ur: NEGATIVE mg/dL
SPECIFIC GRAVITY, URINE: 1.011 (ref 1.005–1.030)

## 2018-07-28 LAB — COMPREHENSIVE METABOLIC PANEL
ALT: 18 U/L (ref 0–44)
AST: 27 U/L (ref 15–41)
Albumin: 3.9 g/dL (ref 3.5–5.0)
Alkaline Phosphatase: 34 U/L — ABNORMAL LOW (ref 38–126)
Anion gap: 12 (ref 5–15)
BUN: 25 mg/dL — ABNORMAL HIGH (ref 8–23)
CALCIUM: 9.6 mg/dL (ref 8.9–10.3)
CO2: 26 mmol/L (ref 22–32)
Chloride: 105 mmol/L (ref 98–111)
Creatinine, Ser: 0.94 mg/dL (ref 0.44–1.00)
GFR calc non Af Amer: 49 mL/min — ABNORMAL LOW (ref >60)
GFR, EST AFRICAN AMERICAN: 57 mL/min — AB (ref >60)
Glucose, Bld: 99 mg/dL (ref 70–99)
Potassium: 4.5 mmol/L (ref 3.5–5.1)
SODIUM: 143 mmol/L (ref 135–145)
TOTAL PROTEIN: 6.5 g/dL (ref 6.5–8.1)
Total Bilirubin: 0.8 mg/dL (ref 0.3–1.2)

## 2018-07-28 LAB — LIPASE, BLOOD: LIPASE: 37 U/L (ref 11–51)

## 2018-07-28 MED ORDER — IOHEXOL 300 MG/ML  SOLN
75.0000 mL | Freq: Once | INTRAMUSCULAR | Status: AC | PRN
  Administered 2018-07-28: 75 mL via INTRAVENOUS

## 2018-07-28 NOTE — Telephone Encounter (Signed)
Pt called nurse line states her legs are still swelling, now causing some pain and "so swollen they're shiny". Pt would like to know if she can restart her lasix. Pt states she is not having any difficulty breathing.  Pt call back [phone] Wallace Cullens, RN

## 2018-07-28 NOTE — ED Provider Notes (Signed)
TIME SEEN: 12:41 AM  CHIEF COMPLAINT: Dysuria, urinary frequency, abdominal pain  HPI: Patient is a 84 year old female history of hypertension who presents to the emergency department with complaints of abdominal pain, dysuria, urinary frequency.  Progressively worsening over the past 2 to 3 months.  States she is seen her primary care physician several times for this without relief.  Has been told she is not having urinary tract infection.  States she has had some incontinence.  No known fevers, vomiting or diarrhea.  She does have intermittent constipation and takes stool softeners and MiraLAX regularly.   Still complains of bilateral lower extremity swelling for several months.  No chest pain or shortness of breath.  ROS: See HPI Constitutional: no fever  Eyes: no drainage  ENT: no runny nose   Cardiovascular:  no chest pain  Resp: no SOB  GI: no vomiting GU: no dysuria Integumentary: no rash  Allergy: no hives  Musculoskeletal: no leg swelling  Neurological: no slurred speech ROS otherwise negative  PAST MEDICAL HISTORY/PAST SURGICAL HISTORY:  Past Medical History:  Diagnosis Date  . Allergic rhinitis, cause unspecified 02/21/2014  . Anxiety state, unspecified   . Breast cancer ([place])    NO STICK OR BLOOD PRESSURE CHECKS IN LEFT ARM  . Carpal tunnel syndrome   . Cervical spondylosis   . Colon polyp 12/19/2009   Transverse-polypoid colorectal mucosa  . Constipation   . Degenerative joint disease   . Diastolic dysfunction   . Diverticula of colon   . Gastritis, chronic   . Hiatal hernia   . Hypertension   . Low back pain   . RBBB (right bundle branch block with left anterior fascicular block)   . Renal cyst   . Venous insufficiency     MEDICATIONS:  Prior to Admission medications   Medication Sig Start Date End Date Taking? Authorizing Provider  Acetaminophen-Codeine 300-15 MG TABS TAKE 1 TABLET BY MOUTH EVERY 4 HOURS AS NEEDED FOR MODERATE PAIN 07/23/18   [name], MD  ALPRAZolam Duanne Moron) 0.25 MG tablet Take 1 tablet (0.25 mg total) by mouth 2 (two) times daily as needed for anxiety. 02/26/18   [name], MD  carvedilol (COREG) 3.125 MG tablet Take 1 tablet (3.125 mg total) by mouth 2 (two) times daily. 09/03/17   La Feria Bing, DO  cholecalciferol (VITAMIN D) 1000 units tablet Take 1,000 Units by mouth daily.    [provider]  docusate sodium (COLACE) 100 MG capsule Take 1 capsule (100 mg total) by mouth 2 (two) times daily as needed for mild constipation or moderate constipation. 04/27/18   Julianne Rice, MD  famotidine (PEPCID) 20 MG tablet Take 1 tablet (20 mg total) by mouth 2 (two) times daily. 01/04/18   [name], MD  feeding supplement, ENSURE ENLIVE, (ENSURE ENLIVE) LIQD Take 237 mLs by mouth 2 (two) times daily between meals. 09/30/16   Nche, Charlene Brooke, NP  furosemide (LASIX) 20 MG tablet Take 20 mg by mouth daily.    [provider]  levocetirizine (XYZAL) 5 MG tablet Take 5 mg by mouth every evening. 05/18/18   [provider]  Multiple Vitamins-Minerals (MULTIVITAMINS THER. W/MINERALS) TABS Take 1 tablet by mouth daily.    [provider]  polyethylene glycol powder (GLYCOLAX/MIRALAX) powder Take 17 g by mouth daily as needed. 04/27/18   Julianne Rice, MD  senna (SENOKOT) 8.6 MG TABS tablet Take 1 tablet (8.6 mg total) by mouth daily. 04/07/18   Winfrey,  Alcario Drought, MD  spironolactone (ALDACTONE) 25 MG tablet TAKE 1 TABLET BY MOUTH ONCE DAILY 06/08/18   Donnamae Jude, MD    ALLERGIES:  Allergies  Allergen Reactions  . Fentanyl Nausea Only and Other (See Comments)    Pt states that she is not allergic  Nausea only.     SOCIAL HISTORY:  Social History   Tobacco Use  . Smoking status: Never Smoker  . Smokeless tobacco: Never Used  Substance Use Topics  . Alcohol use: No    Alcohol/week: 0.0 standard drinks    FAMILY HISTORY: Family History  Problem Relation Age of  Onset  . Colon cancer Neg Hx     EXAM: BP (!) 144/73 (BP Location: Right Arm)   Pulse 68   Temp 98.2 F (36.8 C) (Oral)   Resp 18   Ht 5' (1.524 m)   Wt 54.4 kg   SpO2 99%   BMI 23.44 kg/m  CONSTITUTIONAL: Alert and oriented and responds appropriately to questions. Well-appearing; well-nourished, elderly, nontoxic-appearing HEAD: Normocephalic EYES: Conjunctivae clear, pupils appear equal, EOMI ENT: normal nose; moist mucous membranes NECK: Supple, no meningismus, no nuchal rigidity, no LAD  CARD: RRR; S1 and S2 appreciated; no murmurs, no clicks, no rubs, no gallops RESP: Normal chest excursion without splinting or tachypnea; breath sounds clear and equal bilaterally; no wheezes, no rhonchi, no rales, no hypoxia or respiratory distress, speaking full sentences ABD/GI: Normal bowel sounds; non-distended; soft, tender throughout the lower abdomen, no rebound, no guarding, no peritoneal signs, no hepatosplenomegaly BACK:  The back appears normal and is non-tender to palpation, there is no CVA tenderness EXT: Normal ROM in all joints; non-tender to palpation; nonpitting edema to the midshin bilaterally; normal capillary refill; no cyanosis, no calf tenderness or swelling    SKIN: Normal color for age and race; warm; no rash NEURO: Moves all extremities equally PSYCH: The patient's mood and manner are appropriate. Grooming and personal hygiene are appropriate.  MEDICAL DECISION MAKING: Patient here with lower abdominal pain, dysuria and urinary frequency.  Differential includes UTI, pyelonephritis, kidney stone, colitis.  Will obtain labs, urine with urine culture and CT imaging.  She declines any pain medicine at this time.  ED PROGRESS: Patient's labs, urine are unremarkable.  No sign of UTI.  Urine culture is pending.  CT scan shows constipation but no other acute abnormality.  She reports having regular bowel movements and is on stool softeners and MiraLAX.  Nothing emergent at this  time that requires treatment.  Plan is to discharge patient home with outpatient follow-up with urology and PCP given continued symptoms.   At this time, I do not feel there is any life-threatening condition present. I have reviewed and discussed all results (EKG, imaging, lab, urine as appropriate) and exam findings with patient/family. I have reviewed nursing notes and appropriate previous records.  I feel the patient is safe to be discharged home without further emergent workup and can continue workup as an outpatient as needed. Discussed usual and customary return precautions. Patient/family verbalize understanding and are comfortable with this plan.  Outpatient follow-up has been provided if needed. All questions have been answered.     Bellarose Burtt, Delice Bison, DO 07/28/18 [phone]

## 2018-07-28 NOTE — ED Notes (Signed)
Requested a urine sample.

## 2018-07-28 NOTE — ED Notes (Signed)
Patients son called to pick her up. He states he is on his way.

## 2018-07-28 NOTE — ED Notes (Signed)
Patient waiting for son to pick her up at 5 am.

## 2018-07-28 NOTE — Telephone Encounter (Signed)
Pt informed. Fleeger, Jessica Dawn, CMA  

## 2018-07-29 LAB — URINE CULTURE

## 2018-07-30 DIAGNOSIS — N3281 Overactive bladder: Secondary | ICD-10-CM | POA: Diagnosis not present

## 2018-08-04 ENCOUNTER — Ambulatory Visit: Payer: Medicare Other | Admitting: Family Medicine

## 2018-08-10 DIAGNOSIS — Z1231 Encounter for screening mammogram for malignant neoplasm of breast: Secondary | ICD-10-CM | POA: Diagnosis not present

## 2018-08-13 ENCOUNTER — Telehealth: Payer: Self-pay | Admitting: *Deleted

## 2018-08-13 NOTE — Telephone Encounter (Signed)
Pt left message on nurse line stating that her legs are swollen and she was told to stop her lasix. She would like to know what else to do.   Attempted to call back.  Line was busy x 2 . Fleeger, Salome Spotted, CMA

## 2018-08-16 ENCOUNTER — Telehealth: Payer: Self-pay | Admitting: Family Medicine

## 2018-08-16 NOTE — Telephone Encounter (Signed)
Called Ms. Calkin to discuss her leg swelling.  She cannot tell me whether it is worse now after stopping Lasix.  She is keeping her legs elevated and wearing compression stockings with minimal relief.  Since she has a cardiologist appointment on 10/2, I advised her to discuss this with them and get their opinion on whether Lasix should be restarted, another medication started instead, or if she should continue to manage her legs without medication.  Since she is elderly and has neurocognitive decline, she would benefit from taking fewer medications, but she is also very anxious about her symptoms, so it is difficult to reduce her medication burden.

## 2018-08-18 ENCOUNTER — Ambulatory Visit: Payer: Medicare Other | Admitting: Nurse Practitioner

## 2018-08-18 ENCOUNTER — Encounter: Payer: Self-pay | Admitting: Nurse Practitioner

## 2018-08-18 VITALS — BP 124/70 | HR 77 | Ht 59.0 in | Wt 123.1 lb

## 2018-08-18 DIAGNOSIS — I10 Essential (primary) hypertension: Secondary | ICD-10-CM

## 2018-08-18 NOTE — Progress Notes (Signed)
CARDIOLOGY OFFICE NOTE  Date:  08/18/2018    Heather [name] Date of Birth: 1918-07-08 Medical Record #[id number]  PCP:  [name], MD  Cardiologist:  Johnsie Cancel    Chief Complaint  Patient presents with  . Congestive Heart Failure    Follow up visit - seen for Dr. Johnsie Cancel    History of Present Illness: Heather [name] is a 84 y.o. female who presents today for a follow up visit. Seen for Dr. Johnsie Cancel.   She has a chronic diastolic CHF, HTN, atypical CP s/p normal myoview in 2007 and 2009, breast CA s/p L mastectomy, chronic phlegm/cough, chronic edema, anxiety, depression, & diverticulosis/gastritis/functional abdominal pain.   Review of chart reveals that she constantly complains of multiple somatic complaints and always feels "terrible" w/ mult GI issues and respiratory issues with chronic cough and phlem. She is unhappy w/ PCP and has switched multiple times and see multiple providers concurrently. She sees specialists including pulmonary, GI, ortho and renal. She has multiple abnormalities noted on imaging that are being followed by various specialties. Please see Dr. [name] note from 07/16/15 to see a full list of her problems.   BP can be labile Norvasc has caused edema in past. Worsening dementia which does not help situation  Comes in today. Here alone. She is rambling. She cussed at another patient in the lobby prior to coming back to the Pod. Looking for another PCP again. Says her feelings are hurt. Her legs are swelling. She has multiple GI complaints. Upset that "no one does anything for her". Asking for pain medicines. Wanted me to look over her medicines. Has had more swelling in her legs. Says she has not driven over the past 3 weeks. This seems to be her usual presentation. Her history is very hard to follow/discern.  Past Medical History:  Diagnosis Date  . Allergic rhinitis, cause unspecified 02/21/2014  . Anxiety state, unspecified   . Breast cancer ([place])     NO STICK OR BLOOD PRESSURE CHECKS IN LEFT ARM  . Carpal tunnel syndrome   . Cervical spondylosis   . Colon polyp 12/19/2009   Transverse-polypoid colorectal mucosa  . Constipation   . Degenerative joint disease   . Diastolic dysfunction   . Diverticula of colon   . Gastritis, chronic   . Hiatal hernia   . Hypertension   . Low back pain   . RBBB (right bundle branch block with left anterior fascicular block)   . Renal cyst   . Venous insufficiency     Past Surgical History:  Procedure Laterality Date  . CARPAL TUNNEL RELEASE    . Left mastectomy       Medications: Current Meds  Medication Sig  . Acetaminophen-Codeine 300-15 MG TABS TAKE 1 TABLET BY MOUTH EVERY 4 HOURS AS NEEDED FOR MODERATE PAIN (Patient taking differently: Take 1 tablet by mouth every 4 (four) hours as needed (pain). )  . ALPRAZolam (XANAX) 0.25 MG tablet Take 1 tablet (0.25 mg total) by mouth 2 (two) times daily as needed for anxiety.  . carvedilol (COREG) 3.125 MG tablet Take 1 tablet (3.125 mg total) by mouth 2 (two) times daily.  . cholecalciferol (VITAMIN D) 1000 units tablet Take 1,000 Units by mouth daily.  Marland Kitchen docusate sodium (COLACE) 100 MG capsule Take 1 capsule (100 mg total) by mouth 2 (two) times daily as needed for mild constipation or moderate constipation.  . famotidine (PEPCID) 20 MG tablet Take 1 tablet (20  mg total) by mouth 2 (two) times daily.  . feeding supplement, ENSURE ENLIVE, (ENSURE ENLIVE) LIQD Take 237 mLs by mouth 2 (two) times daily between meals.  Marland Kitchen levocetirizine (XYZAL) 5 MG tablet Take 5 mg by mouth every evening.  . Multiple Vitamins-Minerals (MULTIVITAMINS THER. W/MINERALS) TABS Take 1 tablet by mouth daily.  . polyethylene glycol powder (GLYCOLAX/MIRALAX) powder Take 17 g by mouth daily as needed. (Patient taking differently: Take 17 g by mouth daily as needed for mild constipation. )  . senna (SENOKOT) 8.6 MG TABS tablet Take 1 tablet (8.6 mg total) by mouth daily.  Marland Kitchen  spironolactone (ALDACTONE) 25 MG tablet TAKE 1 TABLET BY MOUTH ONCE DAILY     Allergies: Allergies  Allergen Reactions  . Fentanyl Nausea Only and Other (See Comments)    Pt states that she is not allergic  Nausea only.     Social History: The patient  reports that she has never smoked. She has never used smokeless tobacco. She reports that she does not drink alcohol or use drugs.   Family History: The patient's family history is not on file.   Review of Systems: Please see the history of present illness.   Otherwise, the review of systems is positive for none.   All other systems are reviewed and negative.   Physical Exam: VS:  BP 124/70 (BP Location: Right Arm, Patient Position: Sitting, Cuff Size: Normal)   Pulse 77   Ht 4\' 11"  (1.499 m)   Wt 123 lb 1.9 oz (55.8 kg)   SpO2 99% Comment: at rest  BMI 24.87 kg/m  .  BMI Body mass index is 24.87 kg/m.  Wt Readings from Last 3 Encounters:  08/18/18 123 lb 1.9 oz (55.8 kg)  07/27/18 120 lb (54.4 kg)  07/15/18 121 lb (54.9 kg)    General: Rambling. Does not appear to be in no acute distress.   HEENT: Normal.  Neck: Supple, no JVD, carotid bruits, or masses noted.  Cardiac: Regular rate and rhythm. No murmurs, rubs, or gallops. +1 bilateral edema.  Respiratory:  Lungs are clear to auscultation bilaterally with normal work of breathing.  GI: Soft and nontender.  MS: No deformity or atrophy. Gait and ROM intact. She is using a walker.  Skin: Warm and dry. Color is normal.  Neuro:  Strength and sensation are intact and no gross focal deficits noted.  Psych: Alert, appropriate and with normal affect.   LABORATORY DATA:  EKG:  EKG is not ordered today.  Lab Results  Component Value Date   WBC 5.7 07/28/2018   HGB 13.1 07/28/2018   HCT 40.1 07/28/2018   PLT 190 07/28/2018   GLUCOSE 99 07/28/2018   CHOL 199 02/02/2014   TRIG 20.0 02/02/2014   HDL 98.70 02/02/2014   LDLCALC 96 02/02/2014   ALT 18 07/28/2018   AST 27  07/28/2018   NA 143 07/28/2018   K 4.5 07/28/2018   CL 105 07/28/2018   CREATININE 0.94 07/28/2018   BUN 25 (H) 07/28/2018   CO2 26 07/28/2018   TSH 2.67 11/30/2014       BNP (last 3 results) Recent Labs    06/21/18 1628  BNP 122.5*    ProBNP (last 3 results) No results for input(s): PROBNP in the last 8760 hours.   Other Studies Reviewed Today:   Assessment/Plan:  1. HTN - BP is fine. No changes made.   2. Chronic swelling - has venous insufficiency and diastolic HF - unclear what she  is taking.   3. Dementia -seems progressive. Her history is not able to be followed or discerned.   4. Multiple somatic complaints - this seems to be her usual presentation  Current medicines are reviewed with the patient today.  The patient does not have concerns regarding medicines other than what has been noted above.  The following changes have been made:  See above.  Labs/ tests ordered today include:   No orders of the defined types were placed in this encounter.    Disposition:   FU prn.   Patient is agreeable to this plan and will call if any problems develop in the interim.   SignedTruitt Merle, NP  08/18/2018 3:25 PM  [facility] [address] [place], [place]  [address] Phone: [phone] Fax: [phone]

## 2018-08-18 NOTE — Patient Instructions (Signed)
We will be checking the following labs today - NONE   Medication Instructions:    Continue with your current medicines.     Testing/Procedures To Be Arranged:  N/A  Follow-Up:   See us back as needed.     Other Special Instructions:   N/A    If you need a refill on your cardiac medications before your next appointment, please call your pharmacy.   Call the [facility] office at [phone] if you have any questions, problems or concerns.      

## 2018-08-24 ENCOUNTER — Ambulatory Visit: Payer: Medicare Other | Admitting: Pulmonary Disease

## 2018-08-24 ENCOUNTER — Encounter: Payer: Self-pay | Admitting: Pulmonary Disease

## 2018-08-24 VITALS — BP 122/62 | HR 78 | Temp 97.8°F | Ht 59.0 in | Wt 123.4 lb

## 2018-08-24 DIAGNOSIS — R918 Other nonspecific abnormal finding of lung field: Secondary | ICD-10-CM | POA: Diagnosis not present

## 2018-08-24 DIAGNOSIS — I5032 Chronic diastolic (congestive) heart failure: Secondary | ICD-10-CM

## 2018-08-24 DIAGNOSIS — J410 Simple chronic bronchitis: Secondary | ICD-10-CM | POA: Diagnosis not present

## 2018-08-24 DIAGNOSIS — K219 Gastro-esophageal reflux disease without esophagitis: Secondary | ICD-10-CM | POA: Diagnosis not present

## 2018-08-24 DIAGNOSIS — K224 Dyskinesia of esophagus: Secondary | ICD-10-CM

## 2018-08-24 DIAGNOSIS — K449 Diaphragmatic hernia without obstruction or gangrene: Secondary | ICD-10-CM

## 2018-08-24 NOTE — Patient Instructions (Signed)
Today we updated your med list in our EPIC system...    Continue your current medications the same...  For the swelling>>    Eliminate all salt/ sodium from your diet...    Take your fluid pills as prescribed by your PCP...    Elevate your legs...    Wear support hose...  For the Urinary incontinence>>    Wear the depends + pads...    Continue follow up w/ Urology regarding any additional options for this problem...  Let me know if you have any trouble with your breathing.Heather KitchenMarland [name]

## 2018-08-25 ENCOUNTER — Encounter: Payer: Self-pay | Admitting: Pulmonary Disease

## 2018-08-25 NOTE — Progress Notes (Signed)
Subjective:    Patient ID: Heather [name], female    DOB: May 17, 1918, 84 y.o.   MRN: [id number]  HPI 84 y/o BF here for a follow up visit... she has multiple medical problems including:  Dyspnea & Anxiety;  HBP;  Hx CP & DiastolicCHF followed by DrBensimhon;  HH/ Gastritis;  Divertics/ Constip;  Functional Abd Pain;  Renal cyst followed by DrHerrick;  Hx left breast [phone];  DJD/ CSpine spondy/ LBP/ CTS; and she has a cancer phobia...   ~  SEE PREV EPIC NOTES FOR OLDER DATA >> She has established Primary Care follow up w/ DrJohn => DrHusain, Leodis Binet...   CXR 10/14 showed normal heart size w/ tortuous atherosclerotic Ao, clear lungs w/ ?vague opac left mid zone adjacent to hilum (nothing seen in this area on CT 3/14) ?overlying shadows & we will f/u...  Ambulatory O2 check 10/14> O2 sat= 98% on room air at rest w/ pulse=83, regular; Ambulated 3 laps w/ lowest O2 sat= 96% w/ pulse=108...  LABS 10/14:  Chems- wnl;  CBC- wnl;  TSH=2.04;  VitD=71;  Sed=11...  ~  Heather [name] has established w/ DrJohn for Primary care and has seen him x3 already... She still sees DrCrosslry for ENT and DrStark for GI>>  Baseline CXRs showed sl overinflation, clear lungs, normal heart size w/ elongation & tortuous Ao...  Prev CT Chest/ CTA 3/14 showed no PE, no acute findings, +atherosclerotic changes, several scattered tiny nodules 3-27m size & no change from 2009 scan, s/p left mastectomy, DJD Tspine...   PFTs 9/13 were WNL> FVC=2.62 (128%), FEV1=2.20 (158%), %1sec=84; mid-flows=274% of predicted  CXR 04/04/14 shows stable heart size, atherosclerotic & tortuous Ao, mild hyperinflation, clear lungs w/o acute changes, DJD Tspine...   PFT 07/03/14> despite mult attempts w/ several test administrators/ staff- she was unable to perform the procedure due to inability to cooperate...  Ambulatory O2sat Test 07/03/14 on RA>  Baseline O2sat= 99% w/ pulse=86;  After 3 laps her nadir O2sat= 96% w/ max pulse=  114...  ~  September 21, 2014:  374moOV & pt added-on today at her request for mult symptoms> but on questioning it is apparent that her symptomatology is chronic w/o acute features; she notes cough ("hocking cough"), thick white sputum ("with white balls"), chest congestion; but she denies to me any f/c/s, CP, hemoptysis, etc and she notes that "my breathing is good";;;  Then she launches into a litany of complaints- poor appetite, wt loss ("I'm nothing but a frame"), back pain ("the drawing kind"), and general flight of ideas;  Meds reviewed & include prev ZPak & Mucinex (?taking 1 Qid);  Exam is neg w/ clear chest- no wheezing, rales, or rhonchi hear;  Last CXR was 5/15 showing essentially clear lungs, NAD...    She saw DrCherly Hensenor Cards 9/15> Hx HBP, CHF/ DD, AtypCP, RBBB, neg Myoview 2009; on Coreg3.125Bid, Amlod2.5, Lasix40 as needed for swelling; BP= 112/60 today...     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     Hx mult Ortho complaints and LBP evaluated by DrWhitfield w/ Epid shots per DrNewton but she claims no improvement; notes Tramadol doesn't help and want Vicodin refilled, she wears a back brace...     Very anxious and has Celexa10 & Alprazolam 0.73m18mn her med list...   EKG 9/15 showed NSR, rate81, LAD, RBBB, NSSTTWA, NAD...Marland KitchenMarland Kitchen  PLAN>>  [name] is reassured- her breathing is stable w/o acute lung problems at this time; she is asked to continue the Mucinex669m Qid w/ fluids, and remain as active as possible; reminded to use her ProtonixBid & follow antireflux regimen to help her LPR;  She will f/u w/ her other physicians and see me in 6 months for re-evaluation & f/u CXR...  ~  Mar 22, 2015:  6581moOV & Heather [name] says "I'm just skin & bones" weight is noted to be down 15# to 120# today (BMI=22);  She says that her dentist noted her  teeth are wearing out & this may acct for some wt loss- needs to incr nutritional supplement like BOOST Bid;  She persists w/ mult medical complaints- "this swallowing business" and she is seeing both [facility] and [facility] for help w/ reflux and cancer phobia "it can show up anywhere";  From the resp standpoint she notes same "hocking" throat clearing cough w/ thick, white, foamy sput; she notes SOB/DOE w/o change and she is still not taking the Alpraz or the Mucinex as requested...      She saw DrJohn 1/16> note reviewed & felt to be stable... She tells me [facility]      She saw [facility] 1/16 and they felt no further GI w/u warranted, use abd binder prn...      She saw DrEdwards, GI 2/16> bloating, constip, wt loss> note reviewed EXAM reveals Afeb, VSS, O2sat=99% on RA;  HEENT- neg x sl dry MMs;  Chest- she has a forced dry hacking cough that is nonproductive, clear w/o w/r/r;  Heart- RR w/o m/r/g heard;  Abd- soft, nontender, neg...  We reviewed prob list, meds, xrays and labs> see below for updates >>   LABS 11/2014 were WNL...Marland KitchenMarland KitchenCXR 03/22/15 showed stable cardiomeg, chronic changes, old right rib fxs, NAD...  PLAN>>  Please Heather [name] take the Mucinex60061mone tab Qid w/ fluids, and try the Alprazolam 0.5mg57m2 tab Tid to start; I have tried to reassure her as best I can, she needs to eat better & take the nutritional supplement Bid...  (NOTE> I spent 25mi61mce-to-face w/ pt discussing her symptoms, examining pt, reviewing recommendations, and answering questions)  ~  July 16, 2015:  81mo R63mo Heather [name] presents w/ her usual litany of somatic complaints- "terrible" she says; c/o feeling hot, eyes run water, had dental work done, still w/ mult GI issues, unhappy w/ PCP, etc... She saw DrEdwards for GI eval & his note is reviewed; she went to the ER 05/17/15 w/ GI complaints and the ER physician ordered a CT Abd & Pelvis> a few tiny 3-4mm no61mes were noted in the lateral  aspect of the RML, the abd revealed aortoiliac calcif w/o aneurysm, right renal cyst, otherw no signif abn noted (Radiology rec f/u CT Chest in 70yr); s19yrs very concerned and I indicated that I thought this was prob some scar tissue but we would recheck her CXR in 58mo & fo29mo up the CT in 70yr...   33yr> she c/o cough "hoking little white balls"; baseline CXRs have been clear (old right rib fxs), PFT in 2013 was wnl, oxygenation is wnl, and exam is clear; she has been asked to use Mucinex Qid but she won't do it...    Abn CT scan w/ scat pulm nodules> prev CT Chest w/ tiny left apical oapc, unchanged serially & felt to be benign; CT Abd done  05/17/15 w/ tiny 3-65m RML nodules detected, otherw eg & we discussed f/u CXR in 639mo CT Chest in 1y2yr   HBP> on Coreg3.125Bid, Lasix40, off Amlod; BP= 106/60 & she persists w/ mult somatic complaints; last 2DEcho 10/2009 showed norm LV size & func w/ EF=55-60%, mildAI....    HxCP> on ASA81; followed by DrBensimhon/ DrNishan for Cards & their prev notes are reviewed=> neg ischemic work up previously...     VI, Edema> she went to the ER 11/13 w/ edema; VDopplers were neg for DVT; they reiterated the recs for no salt, elevation, support hose, & continue Lasix40.     GI- HH, Gastirits, Divertics, chronic pain & "swelling"> on Protonix40Bid plus Zantac, Miralax & Senakot-S, etc; she persists w/ mult GI complaints despite [facility], [facility]...    Renal cyst> she is very anxious about everything & has a cancer phobia; DrKimbrough followed this benign simple cyst for yrs- no change...     Hx Breast cancer> she had left mod radical mastectomy 1994 by DrBlievernicht- no known recurrence & she is given reassurance...     Ortho- DJD, Cspine spondy, LBP, CTS> followed by DrWhitfield & he injected her right knee & her back; on Vicodin prn...    Anxiety> on Alpraz0.5mg53m but "I'm not taking it very often" she says & encouraged to use more regularly...   We reviewed prob list, meds, xrays and labs> see below for updates >> her PCP is DrHuArtist BeachglAnimal nutritionistTannHuntington Bay Ba swallow 03/23/15 showed espohageal dysmotility w/ full column stasis and tertiart contractions, no stricture detected...  CT Abd & Pelvis 05/17/15 showed a few tiny 3-4mm 3mules were noted in the lateral aspect of the RML, the abd revealed aortoiliac calcif w/o aneurysm, right renal cyst, otherw no signif abn noted (Radiology rec f/u CT Chest in 16yr).48yrBS 05/17/15 via ER> Chems- wnl;  CBC- wnl... IMP/PLAN>>  I told Heather [name] that the tiny nodules seen in the RML area on CT Abd were likely benign & reassured her that we would follow this area closely w/ CXR in 19mo & 49mohest in 20mo; i20mo meanwhile she will continue her current meds & maintain follow up w/ her PCP & various specialists as needed...   ~  January 17, 2016:  19mo ROV [id number] notes that her breathing is actually doing very well- SOB at baseline, denies CP, palpit, cough, sput, hemoptysis, etc... However she continues to have a litany of somatic complaints that she enumerates at each visit> she tells me that she has left DrJohn because he didn't know anything else to do & is now seeing DrHusain-Alphonsa GinnbauNutriosos DrEdwards... "I'm noting but skin & bones", "foood don't do me no good", "my appetite is good & I eat soul food", "I know what's going on w/ me" but she didn't elaborate, "some meds don't agree w/ others", last med was $400 she says (from Urology), "there's nothing in my stomach except my inards", she was upset w/ AVS showing diagnosis of anxiety...     AB> she c/o cough "hoking little white balls"; baseline CXRs have been clear (old right rib fxs), PFT in 2013 was wnl, oxygenation is wnl, and exam is clear; she has been asked to use Mucinex Qid but she won't do it...    Abn CT scan w/ scat pulm nodules> prev CT Chest w/ tiny left apical opac, unchanged serially & felt to be benign; CT Abd done  05/17/15 w/  tiny 3-43m RML nodules detected, otherw neg & we discussed f/u CXR in 6109mo CT Chest in 1y65yr   HxCP> on ASA81; followed by DrBensimhon/ DrNishan for Cards & their prev notes are reviewed=> neg ischemic work up previously...     GI- HH, Gastirits, Divertics, chronic pain & "swelling"> on Protonix40Bid plus Zantac, Miralax & Senakot-S, etc; she persists w/ mult GI complaints despite [facility] ev[facility], [facility]; most recent testing 09/2015 by DrJEdwards- see imaging studies below;  ENT eval by DrJRosen...    Mult Medical Issues> now followed by PCP DrHusain EXAM reveals Afeb, VSS, O2sat=99% on RA;  HEENT- neg x sl dry MMs;  Chest- she has a forced dry hacking cough that is nonproductive, clear w/o w/r/r;  Heart- RR w/o m/r/g heard;  Abd- soft, nontender, neg...   EKG 08/2015>  NSR, rate74, RBBB, otherw neg ekg...  CXR 09/2105>  Borderline heart size, tortuous Ao, clear sl hyperexpanded lungs, no adenopathy- NAD...   LABS in Epic 09/2015>  Chems- wnl;  CBC- wnl  Abd Sonar 11/2015> heterogeneous liver texture- rec MRI for further eval...  MRI Abd 11/2015> no suspicious hepatic lesions, incidental tiny cyst noted, otherw MRI of Abd was neg...   MBS 11/2015> mod oral phase dysphagia, see full report IMP/PLAN>>  Karmin is stable from the pulm standpoint;  She has mult somatic complaints & a large physician contingent attending to her various symptoms;  I suggested to her that she would be best served by taking the Xanax regularly 0.5mg59m/2 to 1 tab Tid regularly...   ~  May 28, 2016:  50mo 31mo& Shaneil's breathing is at baseline but she complained to her PCP about DOE w/ walking (uses walker) & he told her "I better get it checked out";  Today c/o "I'm just a frame- all skin & bones", "I stay hungry", "I eat soul food & I can't eat no more", "they've done a lot of tests and can't find nothing"; rambling hx & flight of ideas- she is c/o dental issues, ribs sticking out, & still c/o a  knot in her left flank area-- I reminded her to be sure to mention all this to her GI (EdwaOletta Lamastho (WhitDurward FortesCP (HusaLysle Rubenshe can check this out;  We talked about her abd/ back/ legs/ skin/ etc, she wants a stong pain pill, she says she's lonely at 89- "1l my friend's are gone"...    From the pulmonary standpoint-- she c/o being winded after long walks (uses walke- note: she had outpt PT 01/2016), cough w/ little white balls, etc;  Meds include Flonase Qhs and Mucinex600Bid w/ fluids;  Exam is clear & XRay/Labs in the last yr all wnl...  EXAM reveals Afeb, VSS, O2sat=98% on RA;  HEENT- neg x sl dry MMs;  Chest- she has a forced dry hacking cough that is nonproductive, clear w/o w/r/r;  Heart- RR w/o m/r/g heard;  Abd- soft, nontender, neg...   Art Dopplers of LEs 03/31/16>  Normal ABIs & no evid of arterial occlusive dis  LABS 04/2016 in epic>  Chems- wnl;  CBC- wnl  CT Abd & Pelvis 05/02/16>  Mod stool burden in right colon, no obstruction, NAD (she was seen in the Er for abd pain)  She had Speech Path swallowing eval 05/22/16>  Note reviewed- mild oral phase & pharyngeal dysphagia, no aspiration or penetration, esoph dysmotility; recommendations given to pt...  IMP/PLAN>>  Heather [name] appears stable- chest is  clear, oxygenation excellent, and she presents w/ the same mult somatic complaints; she still won't try the Xanax as I've suggested; f/u w/ me as needed...  ~  December 01, 2016:  27moROV & pulmonary followup visit>  Heather [name] [facility]; since I saw her last she has had 5+ PCP office visits and 7+ ER visits for a variety of complaints- divertics, ecchymoses, right arm pain, left groin discomfort;  Today she speaks rapidly w/ pressure of speech (barely taking a breath betw words), flight of ideas and mult somatic complaints... "Too much medicine for my age" "I'm nothing but a frame" she is still convinced there is something in her abd  despite mult evaluations over the yrs....    AB> she c/o cough "hoking little white balls"; baseline CXRs have been clear (old right rib fxs), PFT in 2013 was wnl, oxygenation is wnl, and exam is clear; she has been asked to use Mucinex Qid but she won't do it...    Abn CT scan w/ scat pulm nodules> prev CT Chest w/ tiny left apical opac, unchanged serially & felt to be benign; CT Abd done 05/17/15 w/ tiny 3-432mRML nodules detected, otherw neg & we discussed f/u CXR in 30m38moCT Chest in 40yr540yr  HxCP> on Coreg3.125Bid & Lasix40; BP=110/60 & she has a pos ROS; followed by DrBensimhon/ DrNiCherly Hensen Cards & their prev notes are reviewed=> neg ischemic work up previously...     GI- HH, Gastirits, Divertics, chronic pain & "swelling"> on Protonix40Bid plus Zantac, Miralax & Senakot-S, etc; she persists w/ mult GI complaints despite [facility], [facility]; most recent testing 09/2015 by DrJEdwards- see imaging studies below;  ENT eval by DrJRosen...    Mult Medical Issues> now followed by PCP-CNche, but she has switched several times... EXAM reveals Afeb, VSS, O2sat=99% on RA;  HEENT- neg x sl dry MMs;  Chest- she has a forced dry hacking cough that is nonproductive, clear w/o w/r/r;  Heart- RR w/o m/r/g heard;  Abd- soft, nontender, neg...   CXR 07/28/16>  Mild cardiomegaly, left basilar atelectasis, distal left clavicular fx & old right post rib fxs...  CT Abd & Pelvis 10/2016>  Lung bases clear, aortic atherosclerosis, neg abd structures and no obstructive uropathy...  LABS 10/2016 in epic> Chems- wnl;  CBC- wnl IMP/PLAN>>  Shantee is stable from there pulmonary standpoint; encouraged to use Mucinex & drink plenty of water; needs to be careful & avoid falling!  ~  June 01, 2017:  30mo 43mo& Heather [name] notes that he breathing is good w/ min chr cough/ phlegm in those small white balls that she describes in detail; reminded to take the Mucinex600mgQ71m/ fluids on a regular dosing  schedule;  Her CC remains her stomach- c/o a knot in her abd that no one has been able to find w/ CT scans etc; "I've got a lot of arthritis" "I'm nothing but skin & bones" "I'm a grown woman & know what hurts me" and she is c/o LLQ/ left side/ left flank pain; "My skin is hanging"; Eleaner still does her own meds despite what appears to be a mild dementia...    In the interval betw visits Epic indicates that Tiajuana has been c/o left hip pain, prev eval by DrWhitfield for Ortho, c/o leg pain & swelling w/ PCP at [facility] placing her on Lasix, Aldactone, Coreg for this and HBP, diastolic CHF (  she has seen DrNishan in the past); she has had several visits w/ ER, PCP, ?GI regarding her left sided abd pain; she switched PCP to [place] their notes are reviewed...  We reviewed the following medical problems during today's office visit >>     AB> she c/o cough "hoking little white balls"; baseline CXRs have been clear (old right rib fxs), PFT in 2013 was wnl, oxygenation is wnl, and exam is clear; she has been asked to use Mucinex Qid but she won't do it...    Abn CT scan w/ scat pulm nodules> prev CT Chest w/ tiny left apical opac, unchanged serially & felt to be benign; CT Abd done 05/17/15 w/ tiny 3-19m RML nodules detected, otherw neg & serial scans showed a 415mRML granuloma otherw NEG & no progressive lung changes...    HxCP> on Coreg3.125Bid & Lasix40; BP=110/60 & she has a pos ROS; followed by DrBensimhon/ DrCherly Hensenor Cards & their prev notes are reviewed=> neg ischemic work up previously...     GI- HH, Gastirits, Divertics, chronic pain & "swelling"> on Protonix40Bid plus Zantac, Miralax & Senakot-S, etc; she persists w/ mult GI complaints despite [facility] ev[facility], [facility]; most recent testing 09/2015 by DrJEdwards- see imaging studies below;  ENT eval by DrJRosen...    Mult Medical Issues> now followed by PCP-CNche, but she has switched several times... EXAM reveals  Afeb, VSS, O2sat=99% on RA;  HEENT- neg x sl dry MMs;  Chest- she has a forced dry hacking cough that is nonproductive, clear w/o w/r/r;  Heart- RR w/o m/r/g heard;  Abd- soft, nontender, neg...  IMP/PLAN>>  Tuyet remains stable from the pulmonary standpoint, her CC remains her enigmatic left sided abd discomfort & "knot"; I hav erec that she continue her Mucinex 60068mid w/ fluids; pulmonary follow up can be prn...      ~  December 07, 2017:  26mo71mo & Heather [name] notes in her rambling history that her breathing is good but her arthritis is bad, states that the prev left sided knot is gone "it's nothing but skin" she says, states massage at [employer] really helped, and DrEdwards said she needs a physical;  [facility]... From the pulmonary perspective-- she notes min cough, denies much sput & there is no talk about the "little balls of white phlegm" as before, and she denies SOB today- indeed she is talking rapidly, rambling, and barely stops to take a breath...  Review of interval Epic medical records>      GI f/u by DrEdwards 07/06/17 & 08/05/17>  Generalized abd pain, GERD, dysphagia, constipation; rec to take Miralax, & Protonix, and f/u prn...    Eval [facility] on 09/09/17>  C/o LLQ & left hip pain, noted to be very talkative & flight of ideas, worried that she might have been poisoned;  Labs were ok & UA clear, CT Renal stone protocol showed nonobstructing 2mm 28mt stone, mod fecal burden, DJD Lspine & mild L5 compression (old), aortic atherosclerosis;  Pt reassured, rec to f/u w/ Urology...     [facility]  [facility] She had f/u w/ ORTHO- DrWhitfield 11/16/17>  Neck & right knee pain- given knee injection w/ improvement...     [facility] c/o abd pain- she said present for 8-74mo, 60moe- clear, Labs- wnl, they felt  she had diverticulitis (no CT done) &  given Augmentin but it made her dizzy & weak so she stopped it & was given Tramadol for pain...  We reviewed the following medical problems during today's office visit>      AB> she notes min cough & not c/o sput or SOB; baseline CXRs have been clear (old right rib fxs), PFT in 2013 was wnl, oxygenation is wnl, and exam is clear; she has been asked to use Mucinex Qid but she won't do it...    Abn CT scan w/ scat pulm nodules> prev CT Chest w/ tiny left apical opac, unchanged serially & felt to be benign; CT Abd done 05/17/15 w/ tiny 3-25m RML nodules detected, otherw neg & serial scans showed a 4258mRML granuloma otherw NEG & no progressive lung changes...    HxCP> on Coreg3.125Bid & Lasix20, & Aldactone25; BP=100/60 & she has a pos ROS; followed by DrBensimhon/ DrNishan for Cards & their prev notes are reviewed=> neg ischemic work up previously...  EXAM reveals Afeb, VSS, O2sat=99% on RA;  HEENT- neg x sl dry MMs;  Chest- she has a forced dry hacking cough that is nonproductive, clear w/o w/r/r;  Heart- RR w/o m/r/g heard;  Abd- soft, nontender, neg...   CXR 12/07/17 (independently reviewed by me in the PACS system) showed borderline heart size, underlying COPD w/ clear lungs- NAD, old right rib deformities noted... IMP/PLAN>>  Donyelle is [age]/o and seems to be doing very well overall except for her myriad complaints & concerns; I've always thought that she has a cancer phobia but she is impossible to reassure;  Stable from the pulmonary perspective & has not required antibiotics, inhalers, Pred, Oxygen, etc...  Follow up w/ usKoreaan be prn for breathing problems...    ~  May 18, 2018:  58m75moV & pulmonary follow up visit>  Heather [name] returns without respiratory complaints as her main concerns are weight loss (117# here today- no change over the past 58mo61mod arthritis (DJD followed by DrWhitfiels- s/p shots in her knees);  She notes that her breathing is good, min cough occas w/ the "little white balls of  phlegm", no color, no hemoptysis, no signif CP, and DOE just w/ exertion (uses walker);  She continues to talk non-stop w/ "pressure of speech" and no apparent dyspnea...  "I'm not holding up" "I've gone to pot"  Her PCP is Dr. AWinCandis Schatz pt states "they don't do what I tell them";  DrEdwards-GI told me "nothing else he can do for me" "I eat [place] has more fast in it"  She notes good appetite and "I eat- where it goes is beyond me" We reviewed the following interval Epic medical records>      She saw PCP- DrWinfrey on 04/07/18> c/o back pain, worried about kidney stone & wants referral to Nephrology, hx chr abd pain followed by GI, takes Tylenol#2 prn;  Known DJD on Lumbar spine films, UA w/ +blood & few red cells, C&S was neg- referred to renal but no note in Epic...    She saw PHARM- for med management on 04/22/18> note reviewed, rec to use medication box...    [facility]> c/o abd pain & weakness- noted sl tender to palpation in abd, Afeb/ VSS; LABS> Chems- ok x BUN=34, Cr=1.18, LFTs wnl & Lipase wnl at 33;  CBC- wnl;  Urine- sm blood, otherw neg;   CT ABD/Pelvis 04/27/18> prom stool burden/ constip; Ao  calcif & AoV calcif, lesion left hepatic lobe w/o ch over the past yr, Lumbar spondy & DDD;  REC to take daily Miralax & Colase, f/u w/ GI...    She saw ORTHO- DrWhitfield on 05/06/18> c/o DJD in knees w/ pain R>L & she received a right knee injection...  We reviewed the following medical problems during today's office visit>      AB> she notes min cough & not c/o sput or SOB; baseline CXRs have been clear (old right rib fxs), PFT in 2013 was wnl, oxygenation is wnl, and exam is clear; she has been asked to use Mucinex Qid but she won't do it...    Abn CT scan w/ scat pulm nodules> prev CT Chest w/ tiny left apical opac, unchanged serially & felt to be benign; serial scans showed a 648m RML granuloma otherw NEG & no progressive lung changes...    HxCP> on Coreg3.125Bid &  Lasix20, & Aldactone25; BP=116/60 & she has a pos ROS; followed by DrBensimhon/ DrNishan for Cards & their prev notes are reviewed=> neg ischemic work up previously...  EXAM reveals Afeb, VSS, O2sat=98% on RA;  HEENT- neg x sl dry MMs;  Chest- she has a forced dry hacking cough that is nonproductive, clear w/o w/r/r;  Heart- RR w/o m/r/g heard;  Abd- soft, nontender, neg...  IMP/PLAN>>  Heather [name] really does not have any pulmonary complaints of note;  We reviewed her Labs and XRays, rec continue same meds + regular exercise program...   ~  August 24, 2018:  [id number]               Problem List:  RESPIRATORY SYMPTOMS >> c/o "hocking phlegm" DYSPNEA (ICD-786.9) - eval 5/11 c/o SOB- can't get a DB, can't get enough air in, etc... occurs at rest & w/ exerc "I'm very active despite my arthritis", talks rapidly in long sentences w/o apparent distress... no cough, phlegm, hemoptysis, etc- but as usual she is quite distressed by these symptoms and wants further eval> we discussed re-assessment w/ CXR (clear, NAD);  PFT (totally norm airflow);  Labs (all WNL);  Rx w/ ALPRAZOLAM 0.48m39mid & encouraged to take regularly. ~  CXR 4/13 showed norm heart size, clear lungs, mild TSpine spondylosis... ~  She continues to complain of drainage, throat symptoms, etc; Rx w/ Mucinex, OTC antihist, Nasonex, etc;  Seen by ENT, DrRosen w/ ?xerostomia & rec incr water etc. ~  9/13: Add-on visit for "hocking, it's not a cough, it's a hock"> see above... ~  9/13: CXR shows sl overinflation, clear lungs, normal heart size w/ elongation & tortuous Ao;  PFTs are wnl> FVC=2.62 (128%), FEV1=2.20 (158%), %1sec=84; mid-flows=274% of predicted... We discussed trial rx for her symptoms- Mucinex, Fluids, Hycodan, Dulera100- 1puffbid. ~  10/13: [facility] & mouth symptoms "like glue" therefore stopped this med; advised to continue the others. ~  3/14:  CT Chest (done after call from Ortho PA regarding her  symptoms)=> then CTA (done via an ER visit) showed no PE, no acute findings, +atherosclerotic changes, several scattered tiny nodules 3-4mm28mze & no change from 2009 scan, s/p left mastectomy, DJD Tspine...  ~  4/14: she is asked to increase the [facility]ucine[facility]600[facility]74m2Bid [facility]... ~  10/14: CXR showed normal heart size w/ tortuous atherosclerotic Ao, clear lungs w/ ?vague opac left mid zone adjacent to hilum (nothing seen in this area on CT 3/14) ?overlying shadows & we will f/u...  ~Marland KitchenMarland Kitchen  CXR 04/04/14 shows stable heart size, atherosclerotic & tortuous Ao, mild hyperinflation, clear lungs w/o acute changes, DJD Tspine... ~  8/15 & 11/15> she remains stable from the pulmonary standpoint on Mucinex600Qid + Fluids; rec to follow better antireflux regimen for her cough, LPR, choking...  ~  CXR 03/22/15 showed stable cardiomeg, chronic changes, old right rib fxs, NAD ~  She went to the ER 05/17/15 w/ GI complaints and the ER physician ordered a CT Abd & Pelvis> a few tiny 3-31m nodules were noted in the lateral aspect of the RML, the abd revealed aortoiliac calcif w/o aneurysm, right renal cyst, otherw no signif abn noted (Radiology rec f/u CT Chest in 128yr she is very concerned and I indicated that I thought this was prob some scar tissue but we would recheck her CXR in 23m47mofollow up the CT in 77yr27yr Heather [name] ESTABLISHED Primary Care w/ , , , DrMcMullen & DrWinfrey @ [facility]>   HYPERTENSION (ICD-401.9) -  ~  on AMLODIPINE 2.5mg/8m COREG 3.125mgB20m LASIX 40mg- 49ming 1/2tab/d or just using it prn swelling... ~  8/16: her meds have been adjusted> on Coreg3.125Bid, Lasix40, off Amlod; BP= 106/60 & she persists w/ mult somatic complaints; last 2DEcho 10/2009 showed norm LV size & func w/ EF=55-60%, mildAI.  Hx of CHEST PAIN (ICD-786.50) - she takes ASA 81mg/d 22mlenol Prn... followed by DrBensimhon for Cards. ~  Baseline EKG w/ RBBB & LAD, no acute changes... ~   NuclearStressTest done 2007 & 2009 showed normal- without scar or ischemia, EF=67%...   ~  2DEcho 7/08 showed mild AI, mild diastolic dysfunction, EF=55-60MG=86-76%Abd 1/09 & 9/12 showed incidental coronary calcif, & aorto-iliac atherosclerotic changes as well... ~  8/13: she saw DrNishan> HBP, DD, atypCP w/ neg Myoviews & no changes made... ~  10/13:  She uses a back brace/ abd binder to help her CWP... ~  EKG 9/15 showed NSR, rate81, LAD, RBBB, NSSTTWA, NAD...  VENOUS INSUFFICIENCY (ICD-459.81) - she has mod VI and follows a low sodium diet, elevates legs when able, and wears support hose. ~  7/12:  C/o incr edema & we reviewed a 2gm sodium diet & increased her Lasix from 20mg/d t9mmg/d...57m5/13:  She has persist complaints but mild chr VI w/ min edema & reminded to elim sodium, elev, support hose, take the Lasix... ~ 11/13: she went to the ER w/ edema; VDopplers were neg for DVT; they reiterated the recs for no salt, elevation, support hose, & continue Lasix40. ~  She has persistent edema in LEs and rec to avoid sodium, elev legs, wear support hose...  HIATAL HERNIA (ICD-553.3) & GASTRITIS (ICD-535.50) >>  ~  on OMEPRAZOLE 20mg/d & f73mwed by DrJEdwards for GI having fired DrStark & [facility] in past. ~  last EGD was 2/08 w/ 3cm HH & gastitis... ~  CT Abd 1/09 revealed calcif gallstones, & mild ectasia of AA... ~  CT Abd 1/11 showed left colon divertics, tiny hepatic lesions w/o change & right renal cyst stable. ~  CT Abd 9/12 showed divertics (no inflamm), right renal & left hep lobe cysts, atherosclerotic changes, NAD... ~  Seen by DrEdwards 4/13> c/o dysphagia & congestion; he notes mult GI complaints, known esoph dysmotility w/o stricture, hard to pin down her complaints... ~  She has followed up w/ DrStark regarding her concerns about swelling in her abd... ~  CT Abd Pelvis 3/14 showed Tiny  cysts in the lateral segment left hepatic lobe, 1.4 cm right upper pole renal cyst, atherosclerotic  calcifications of the abdominal aorta and branch vessels, DJD spine... ~  2015: now on Protonix40Bid but not taking regularly & advised BID dosing & antireflux regimen for her LPR...   DIVERTICULOSIS, COLON (ICD-562.10) - followed regularly by DrStark. ~  colonoscopy 7/03 by DrStark showing divertics only... we discussed Miralax + Senakot-S for constipation. ~  colonoscopy 2/11 showed divertics, 28m polyp, hems...  ? of ABDOMINAL PAIN & SYMPTOMATOLOGY - she had a very thorough work up in 2009> she has a cancer phobia and is very difficult to reassure her that everything appropriate has been done & no cancer found... Mult additional GI evals since then from [facility], [facility] (DrEdwards), [facility] (DrThorne)... ~  eval by GI- DrStark 12/08 but pt wasn't satisfied w/ his examination... ~  full labs 1/09 were WNL, sed=20... ~  CT Abd/ Pelvis 1/09= no acute abn in abd (calcif gallstones & mild ectasia of AA), calcif fibroid in the pelvis, & ?regarding the left ovary. ~  referral to DrFontaine, GYN> his notes are reviewed... ~  follow up w/ DrKimbrough, Urology> his notes are reviewed... ~  eval DrWhitfield for Orthopedics- [facility]. ~  repeat GI eval 1/11 by DrStark w/ CT & colonoscpy as above- no acute problems. ~  5/11 & 7/11 >> persist complaints w/ neg recheck by DrStark & DrKimbrough... ~  12/11:  routine GYN surveillance DrFontaine found anteverted uterus w/ fibroids, atrophic ovaries bilat, no mass. ~  Repeat GI eval 4/12 by DrStark> he tried to reassure the pt, felt some symptoms might be from poor abd wall muscle tone, rec continue Omep, laxatives... ~  6/12:  She sought another GI opinion DrEdwards> he did not feel she needed any additional testing... ~  She tells me she has yet another appt to see a gastroenterologist at [facility]==> seen by DrThorne 8/12 & note reviewed... ~  EGD by DrThorne 9/12 w/ mult whitish nodular plaques in cardia & fundus> Bx  results pending & she is very concerned (remember her hx cancer phobia). ~  [facility]... ~  [facility] back [facility]/ [facility]ark [facility] => [facility]Marland Kitchen  ~  CT Abd & Pelvis 05/17/15 via ER showed a few tiny 3-459mnodules were noted in the lateral aspect of the RML, the abd revealed aortoiliac calcif w/o aneurysm, right renal cyst, otherw no signif abn noted (Radiology rec f/u CT Chest in 1y62yr RENAL CYST (ICD-593.2) - she has a 1 cm right renal cyst seen on CT in 2007 and followed by DrKimbrough... f/u CT Abd 1/09 - no change in the cyst... f/u renal ultrasound 1/10 w/ small bilat cysts... f/u CT Abd 1/11- no change in cyst. ~  she saw DrKimbrough 7/11 for LBP, microhematuria, urge incontinence- stable, no additional Rx. ~  she saw DrKimbrough 1/12 for urge incontinence, freq, nocturia> rec Desmopressin 0.2mg33m/2 tab Qhs. ~  No change in the renal cyst over the yrs...  CARCINOMA, BREAST, LEFT (ICD-174.9) - Surgery 9/94 by DrBlievernicht w/ Left modified radical mastectomy... +estrogen receptors and Rx w/ Tamoxifen... f/u mammograms all neg... breast exam on right has been neg- no nodules palpated... She says that DrBertrand insisted that her PCP check her right breast at each & every office visit to be sure  she doesn't develop any nodules...  DEGENERATIVE JOINT DISEASE (ICD-715.90) - on OTC meds + VICODIN Prn... she has end-stage OA of Right Knee per DrWhitfield treated w/ shots in the past... she has a knee brace and a cane for ambulation... ~  8/13:  She wants a better pain pill & we discussed change to [place] prn... ~  12/13: she tells me that DrWhitfield injected her knee last wk- improved...  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 Rx w/ rest, heat, Percocet, and f/u w/ Ortho...  Hx of LOW BACK PAIN, CHRONIC (ICD-724.2) - eval  DrWhitfield w/ rec for shots by DrNewton & she's feeling sl better & wears back brace... ~  5/10: mult somatic complaints- primarily c/o left side/ postero-lat rib pain...  Exam w/ tender left lat/ post ribs... Bone Scan= neg... ~  MRI Lumbar spine 7/10 by DrWhitfield... ~  Epidural steroid shot by DrNewton 8/10 & 1/11 ~  Repeat LBP eval by DrWhitfield w/ another MRI lumbar spine showing degenerative changes w/ mild progression since 7/10 MRI, mild-mod sp stenosis & lat recess stenosis at several levels ~  DrWhitfield referred her to [facility] to consider more shots but she is not so inclined... ~  6/13: she saw DrTooke w/ lumbar spondylosis; he felt that he had nothing to offer her & rec incr Tramadol to Qid... ~  2014:  She has had numerous visits w/ DrWhitfield/ [facility] Patches for injections, XRays, scans etc...  CARPAL TUNNEL SYNDROME, BILATERAL (ICD-354.0) - she had prev right carpal tunnel release by DrWhitfield and was c/o increasing discomfort in her left wrist despite wrist splint Qhs- had left carpal tunnel release 1/10 by DrWhitfield...  ANXIETY (ICD-300.00) - on ALPRAZOLAM 0.36m Tid & encouraged to take it regularly... she has a cancer phobia & it is very difficult to reassure her regarding her symptoms and our evaluations... ~  7/13: she went to the ER c/o swelling of the tongue but eval by EDP indicated that her tongue was normal & not swollen, she was not on an ACE, given Pred Rx anyway. ~  She has been asked to incr the ALprazolam to regular dosing but she is reluctant...   Past Surgical History:  Procedure Laterality Date  . CARPAL TUNNEL RELEASE    . Left mastectomy      Outpatient Encounter Medications as of 08/24/2018  Medication Sig  . Acetaminophen-Codeine 300-15 MG TABS TAKE 1 TABLET BY MOUTH EVERY 4 HOURS AS NEEDED FOR MODERATE PAIN (Patient taking differently: Take 1 tablet by mouth every 4 (four) hours as needed (pain). )  . ALPRAZolam (XANAX) 0.25 MG tablet Take 1 tablet  (0.25 mg total) by mouth 2 (two) times daily as needed for anxiety.  . carvedilol (COREG) 3.125 MG tablet Take 1 tablet (3.125 mg total) by mouth 2 (two) times daily.  . cholecalciferol (VITAMIN D) 1000 units tablet Take 1,000 Units by mouth daily.  .Marland Kitchendocusate sodium (COLACE) 100 MG capsule Take 1 capsule (100 mg total) by mouth 2 (two) times daily as needed for mild constipation or moderate constipation.  . famotidine (PEPCID) 20 MG tablet Take 1 tablet (20 mg total) by mouth 2 (two) times daily.  . feeding supplement, ENSURE ENLIVE, (ENSURE ENLIVE) LIQD Take 237 mLs by mouth 2 (two) times daily between meals.  .Marland Kitchenlevocetirizine (XYZAL) 5 MG tablet Take 5 mg by mouth every evening.  . Multiple Vitamins-Minerals (MULTIVITAMINS THER. W/MINERALS) TABS Take 1  tablet by mouth daily.  . polyethylene glycol powder (GLYCOLAX/MIRALAX) powder Take 17 g by mouth daily as needed. (Patient taking differently: Take 17 g by mouth daily as needed for mild constipation. )  . senna (SENOKOT) 8.6 MG TABS tablet Take 1 tablet (8.6 mg total) by mouth daily.  Marland Kitchen spironolactone (ALDACTONE) 25 MG tablet TAKE 1 TABLET BY MOUTH ONCE DAILY   No facility-administered encounter medications on file as of 08/24/2018.     Allergies  Allergen Reactions  . Fentanyl Nausea Only and Other (See Comments)    Pt states that she is not allergic  Nausea only.     Current Medications, Allergies, Past Medical History, Past Surgical History, Family History, and Social History were reviewed in [employer] record.  IMMUNIZATION Hx is not complete & she is reminded to discuss w/ her PCP to be sure she is up-to-date on her indicated vaccinations...    Review of Systems       See HPI - all other systems neg except as noted... She has mult somatic complaints and anxiety. The patient complains of decreased hearing, chest discomfort, dyspnea on exertion, abdominal pain, incontinence, muscle weakness, and difficulty  walking.  The patient denies anorexia, fever, vision loss, hoarseness, syncope, peripheral edema, prolonged cough, headaches, hemoptysis, melena, hematochezia, severe indigestion/heartburn, hematuria, transient blindness, depression, abnormal bleeding, enlarged lymph nodes, and angioedema.     Objective:   Physical Exam    WD, WN, 84 y/o BF in NAD... she is very anxious... GENERAL:  Alert & oriented; pleasant & cooperative... HEENT:  [place]/AT, EOM-wnl, PERRLA, EACs-clear, TMs-wnl, NOSE-clear, THROAT-clear & wnl, no lesions seen. NECK:  Neck ROM decreased, no JVD; normal carotid impulses w/o bruits; no thyromegaly or nodules palpated; no lymphadenopathy. CHEST:  Clear to P & A; without wheezes/ rales/ or rhonchi heard... BREAST:  s/p left mastectomy... right breast normal- without nodules palpated... HEART:  Regular Rhythm; without murmurs/ rubs/ or gallops detected... ABDOMEN:  Soft & nontender; normal bowel sounds; no organomegaly or masses palpated... EXT: without deformities, mod arthritic changes; no varicose veins/ +venous insuffic/ tr edema. NEURO:  CN's intact; no focal neuro deficits... DERM:  Small sl excoriated area of dermatitis left shin- no drainage,no rash, etc...  RADIOLOGY DATA:  Reviewed in the EPIC EMR & discussed w/ the patient...  LABORATORY DATA:  Reviewed in the EPIC EMR & discussed w/ the patient...   Assessment & Plan:    RESPIRATORY SYMPTOMS w/ Dyspnea & throat clearing, prob LPR>  She had a neg pulm eval 2011 and numerous times thereafter; she insisted on additional eval 9/13 for her chr throat clearing symptom; CXR w/o acute changes & PFTs normal for her 84 y/o lungs; she is quite insistent on med rx for her problem but has not had relief from anything; REC to take [id number], Fluids, DELSYM prn; pt very anxious & Alprazolam seems to help when she takes it... 11/15> she has been taking the OTC MUCINEX 657m Qid w/ fluids and seems sl improved; rec strict  antireflux regimen w/ Protonix before dinner, NPO after dinner, elev HOB 6" blocks... 5/16 - 8/16> she is once again off all these meds and c/o incr symptoms; rec to restart the Mucinex, fluids, and antireflux regimen... 01/17/16>   Skyy is stable from the pulm standpoint;  She has mult somatic complaints & a large physician contingent attending to her various symptoms;  I suggested to her that she would be best served by taking the Xanax regularly 0.512m 1/2  to 1 tab Tid regularly (she never did). 05/28/16>   Sister appears stable- chest is clear, oxygenation excellent, and she presents w/ the same mult somatic complaints; she still won't try the Xanax as I've suggested; f/u w/ me as needed... 12/01/16>   Trinia is stable from there pulmonary standpoint; encouraged to use Mucinex & drink plenty of water; needs to be careful & avoid falling! 06/01/17>   Anderson remains stable from the pulmonary standpoint, her CC remains her enigmatic left sided abd discomfort & "knot"; I hav erec that she continue her Mucinex 680m Qid w/ fluids; pulmonary follow up can be prn... 12/07/17>   Heather [name] is 84y/o and seems to be doing very well overall except for her myriad complaints & concerns; I've always thought that she has a cancer phobia but she is impossible to reassure;  Stable from the pulmonary perspective & has not required antibiotics, inhalers, Pred, Oxygen, etc...  Follow up w/ uKoreacan be prn for breathing problems...  05/18/18>   Britton really does not have any pulmonary complaints of note;  We reviewed her Labs and XRays, rec continue same meds + regular exercise program.   Abn CT Abd w/ several tiny nodules reported in the RML area>  We discussed this & decided to f/u CXR & CT scans => no serial changes, stable...   Hx CP> evaluated for Cards by DrBensimon & he has not been in favor of invasive testing; stable on conservative med rx...  GI> HH, Gastritis, Divertics, vague abd pain>  I tried once  again to reassure her about the thorough GI evaluations that her has undergone; she is happy w/ DrThorne's eval & will f/u w/ her & in the meanwhile continue rx w/ Prilosec40, Miralax, Metamucil, Senakot, Mylicon, etc... She has re-established w/ [facility]- their notes reviewed...  Hx left Breast Cancer>  No known recurrence...  Anxiety>  Asked once again to increase the Alprazolam to 1/2 - 1 tab TID regularly...   Patient's Medications  New Prescriptions   No medications on file  Previous Medications   ACETAMINOPHEN-CODEINE 300-15 MG TABS    TAKE 1 TABLET BY MOUTH EVERY 4 HOURS AS NEEDED FOR MODERATE PAIN   ALPRAZOLAM (XANAX) 0.25 MG TABLET    Take 1 tablet (0.25 mg total) by mouth 2 (two) times daily as needed for anxiety.   CARVEDILOL (COREG) 3.125 MG TABLET    Take 1 tablet (3.125 mg total) by mouth 2 (two) times daily.   CHOLECALCIFEROL (VITAMIN D) 1000 UNITS TABLET    Take 1,000 Units by mouth daily.   DOCUSATE SODIUM (COLACE) 100 MG CAPSULE    Take 1 capsule (100 mg total) by mouth 2 (two) times daily as needed for mild constipation or moderate constipation.   FAMOTIDINE (PEPCID) 20 MG TABLET    Take 1 tablet (20 mg total) by mouth 2 (two) times daily.   FEEDING SUPPLEMENT, ENSURE ENLIVE, (ENSURE ENLIVE) LIQD    Take 237 mLs by mouth 2 (two) times daily between meals.   LEVOCETIRIZINE (XYZAL) 5 MG TABLET    Take 5 mg by mouth every evening.   MULTIPLE VITAMINS-MINERALS (MULTIVITAMINS THER. W/MINERALS) TABS    Take 1 tablet by mouth daily.   POLYETHYLENE GLYCOL POWDER (GLYCOLAX/MIRALAX) POWDER    Take 17 g by mouth daily as needed.   SENNA (SENOKOT) 8.6 MG TABS TABLET    Take 1 tablet (8.6 mg total) by mouth daily.   SPIRONOLACTONE (ALDACTONE) 25 MG TABLET  TAKE 1 TABLET BY MOUTH ONCE DAILY  Modified Medications   No medications on file  Discontinued Medications   No medications on file

## 2018-08-31 ENCOUNTER — Ambulatory Visit: Payer: Medicare Other | Admitting: Family Medicine

## 2018-09-02 ENCOUNTER — Other Ambulatory Visit: Payer: Self-pay

## 2018-09-02 ENCOUNTER — Ambulatory Visit: Payer: Medicare Other | Admitting: Family Medicine

## 2018-09-02 DIAGNOSIS — R600 Localized edema: Secondary | ICD-10-CM

## 2018-09-02 MED ORDER — TORSEMIDE 10 MG PO TABS: 10.0000 mg | ORAL_TABLET | Freq: Every day | ORAL | 1 refills | Status: DC

## 2018-09-02 NOTE — Progress Notes (Signed)
Subjective:    Patient ID: Heather [name], female    DOB: Jul 14, 1918, 84 y.o.   MRN: [id number]   CC: leg swelling  HPI:  Leg Swelling: Patient with chronic leg swelling.  Previously seen in office multiple times.  Patient was to follow-up with cardiology, but they were unclear with what pills she was taking.  Patient reports that she is taking furosemide daily.  Reports that she had stopped taking it for a while but that she was still going to the bathroom all the time, so she began taking it again in order to help with her leg swelling.  Reports that it is no longer helping.  Patient reports that she goes to the bathroom this is frequently whether without the Lasix.  Patient reports that it has been worsening over the past 3 months.  At one point patient was taking Lasix twice a day but also reported that this was not helping.  Patient with bag of medication in office which shows that she is taking furosemide and is able to tell me which pill this is and how often she is taking it.  Patient also concerned regarding her many medications on her old med list show that she is taking them for constipation.  Patient would like for this list to be updated.  Smoking status reviewed  ROS: 10 point ROS is otherwise negative, except as mentioned in HPI  Patient Active Problem List   Diagnosis Date Noted  . Back pain 04/08/2018  . Medication management 02/28/2018  . Sacral pain 02/07/2018  . Urinary incontinence without sensory awareness 12/02/2017  . Arthritis of left hip 10/20/2017  . Functional abdominal pain syndrome 09/02/2017  . Encounter for chronic pain management 07/22/2017  . Dementia ([place]) 06/25/2017  . Vitamin D deficiency 05/21/2017  . Abnormal CT scan of lung 07/16/2015  . Esophageal dysmotility 10/27/2014  . Failure to thrive in adult 04/26/2014  . Arthritis of hand, degenerative 04/10/2014  . Other and unspecified hyperlipidemia 04/10/2014  . LPRD (laryngopharyngeal reflux  disease) 04/04/2014  . Chronic bronchitis ([place]) 05/30/2013  . Hx of breast cancer 08/30/2012  . Edema 06/12/2011  . GERD 03/27/2010  . Diastolic CHF ([place]) 62/37/6283  . Constipation 01/18/2009  . Carpal tunnel syndrome on both sides 10/03/2008  . Cervical spondylosis without myelopathy 03/21/2008  . Diaphragmatic hernia 12/26/2007  . Renal cyst 12/26/2007  . Primary hypertension 09/15/2007  . Venous (peripheral) insufficiency 09/15/2007  . Osteoarthritis 09/15/2007  . Lumbago 09/15/2007  . Diverticulosis of colon 05/24/2002     Objective:  BP 92/60   Pulse 83   Temp 97.9 F (36.6 C) (Oral)   Ht 4\' 11"  (1.499 m)   Wt 123 lb 3.2 oz (55.9 kg)   SpO2 98%   BMI 24.88 kg/m  Vitals and nursing note reviewed  General: NAD, pleasant Cardiac: RRR, normal heart sounds, no murmurs Respiratory: CTAB, normal effort Extremities: 3+ pitting edema to knees BLLE, no cyanosis. WWP. Skin: warm and dry, no rashes noted Neuro: alert and oriented, no focal deficits Psych: normal affect  Assessment & Plan:   Edema BLLE 3+ pitting edema. Patient to stop lasix (confirmed pt taking in office as she brought it in) and start taking torsemide 10mg  daily If it does not improve, pt and son instructed that they may increase to twice daily, but will need to follow up.  Clarified patient's medication forms and ensured her that a lot of the medicines for constipation were as needed medications.  Patient voiced understanding.  [place] Andy Moye, DO Family Medicine Resident PGY-2

## 2018-09-02 NOTE — Patient Instructions (Signed)
Thank you for coming to see me today. It was a pleasure! Today we talked about:   Your leg swelling. Please STOP the lasix (furosemide) and START taking torsemide 10mg  daily. This should help with your leg swelling. If it does not improve, you may increase to twice daily, but you will need to follow up with your doctor as well.   Please follow-up with your primary care provider in 1-2 weeks or as needed.  If you have any questions or concerns, please do not hesitate to call the office at [phone].  Take Care,   [place] Delmont Prosch, DO

## 2018-09-06 NOTE — Assessment & Plan Note (Addendum)
BLLE 3+ pitting edema. Patient to stop lasix (confirmed pt taking in office as she brought it in) and start taking torsemide 10mg  daily If it does not improve, pt and son instructed that they may increase to twice daily, but will need to follow up.

## 2018-09-17 ENCOUNTER — Other Ambulatory Visit: Payer: Self-pay

## 2018-09-17 DIAGNOSIS — F411 Generalized anxiety disorder: Secondary | ICD-10-CM

## 2018-09-17 MED ORDER — ALPRAZOLAM 0.25 MG PO TABS: 0.2500 mg | ORAL_TABLET | Freq: Two times a day (BID) | ORAL | 1 refills | Status: DC | PRN

## 2018-09-21 ENCOUNTER — Ambulatory Visit: Payer: Medicare Other | Admitting: Family Medicine

## 2018-09-21 ENCOUNTER — Other Ambulatory Visit: Payer: Self-pay

## 2018-09-21 VITALS — BP 98/55 | HR 64 | Temp 98.0°F | Wt 120.0 lb

## 2018-09-21 DIAGNOSIS — R600 Localized edema: Secondary | ICD-10-CM

## 2018-09-21 MED ORDER — FUROSEMIDE 40 MG PO TABS: 40.0000 mg | ORAL_TABLET | Freq: Every day | ORAL | 1 refills | Status: DC

## 2018-09-21 NOTE — Progress Notes (Signed)
    Subjective:    Patient ID: Heather [name], female    DOB: 1918/09/11, 84 y.o.   MRN: [id number]   CC: leg swelling  HPI: patient coming in due to persistent and worsening swelling of both legs. Her legs are tight and painful. She is elevating them at home. She does not eat salt or add salt to food. She has compression hose but is sometimes unable to get them on at home. She was recently switched from lasix to torsemide but feels like she may fall when she takes the torsemide and feels like the swelling has worsened since she stopped the lasix. She denies SOB, orthopnea, cough. She denies redness or skin breakdown on her legs. Her son is with her and helps provide some history.   Smoking status reviewed- never smoker  Review of Systems- see HPI   Objective:  BP (!) 98/55   Pulse 64   Temp 98 F (36.7 C) (Oral)   Wt 120 lb (54.4 kg)   SpO2 94%   BMI 24.24 kg/m  Vitals and nursing note reviewed  General: well nourished, in no acute distress HEENT: normocephalic, MMM Cardiac: RRR Respiratory: no increased work of breathing Extremities: +3 pitting edema up to knees bilaterally  Skin: warm and dry, no rashes noted Neuro: alert and oriented, no focal deficits   Assessment & Plan:    Edema  Chronic, worsened since switching to torsemide 10 mg. I feel this is from a combination of venous insufficiency and heart failure. She does not appear to be fluid overloaded or over diuresed today. Her weight is stable. Will switch back to lasix 40 mg daily. Encouraged leg elevation, no salt, and continued compression. I made her a follow up appt with her PCP for 1 month. Reasons to return sooner reviewed.    No follow-ups on file.   Lucila Maine, DO Family Medicine Resident PGY-3

## 2018-09-21 NOTE — Assessment & Plan Note (Signed)
  Chronic, worsened since switching to torsemide 10 mg. I feel this is from a combination of venous insufficiency and heart failure. She does not appear to be fluid overloaded or over diuresed today. Her weight is stable. Will switch back to lasix 40 mg daily. Encouraged leg elevation, no salt, and continued compression. I made her a follow up appt with her PCP for 1 month. Reasons to return sooner reviewed.

## 2018-09-21 NOTE — Patient Instructions (Addendum)
Stop torsemide as you feel badly when you take it and your swelling seems to be worse.   Let's switch back to the lasix 40 mg daily.  If you have questions or concerns please do not hesitate to call at [phone].  Lucila Maine, DO PGY-3, [facility] 09/21/2018 4:16 PM   Chronic Venous Insufficiency Chronic venous insufficiency, also called venous stasis, is a condition that prevents blood from being pumped effectively through the veins in your legs. Blood may no longer be pumped effectively from the legs back to the heart. This condition can range from mild to severe. With proper treatment, you should be able to continue with an active life. What are the causes? Chronic venous insufficiency occurs when the vein walls become stretched, weakened, or damaged, or when valves within the vein are damaged. Some common causes of this include:  High blood pressure inside the veins (venous hypertension).  Increased blood pressure in the leg veins from long periods of sitting or standing.  A blood clot that blocks blood flow in a vein (deep vein thrombosis, DVT).  Inflammation of a vein (phlebitis) that causes a blood clot to form.  Tumors in the pelvis that cause blood to back up.  What increases the risk? The following factors may make you more likely to develop this condition:  Having a family history of this condition.  Obesity.  Pregnancy.  Living without enough physical activity or exercise (sedentary lifestyle).  Smoking.  Having a job that requires long periods of standing or sitting in one place.  Being a certain age. Women in their 62s and 73s and men in their 97s are more likely to develop this condition.  What are the signs or symptoms? Symptoms of this condition include:  Veins that are enlarged, bulging, or twisted (varicose veins).  Skin breakdown or ulcers.  Reddened or discolored skin on the front of the leg.  Brown, smooth, tight, and  painful skin just above the ankle, usually on the inside of the leg (lipodermatosclerosis).  Swelling.  How is this diagnosed? This condition may be diagnosed based on:  Your medical history.  A physical exam.  Tests, such as: ? A procedure that creates an image of a blood vessel and nearby organs and provides information about blood flow through the blood vessel (duplex ultrasound). ? A procedure that tests blood flow (plethysmography). ? A procedure to look at the veins using X-ray and dye (venogram).  How is this treated? The goals of treatment are to help you return to an active life and to minimize pain or disability. Treatment depends on the severity of your condition, and it may include:  Wearing compression stockings. These can help relieve symptoms and help prevent your condition from getting worse. However, they do not cure the condition.  Sclerotherapy. This is a procedure involving an injection of a material that "dissolves" damaged veins.  Surgery. This may involve: ? Removing a diseased vein (vein stripping). ? Cutting off blood flow through the vein (laser ablation surgery). ? Repairing a valve.  Follow these instructions at home:  Wear compression stockings as told by your health care provider. These stockings help to prevent blood clots and reduce swelling in your legs.  Take over-the-counter and prescription medicines only as told by your health care provider.  Stay active by exercising, walking, or doing different activities. Ask your health care provider what activities are safe for you and how much exercise you need.  Drink enough fluid  to keep your urine clear or pale yellow.  Do not use any products that contain nicotine or tobacco, such as cigarettes and e-cigarettes. If you need help quitting, ask your health care provider.  Keep all follow-up visits as told by your health care provider. This is important. Contact a health care provider if:  You  have redness, swelling, or more pain in the affected area.  You see a red streak or line that extends up or down from the affected area.  You have skin breakdown or a loss of skin in the affected area, even if the breakdown is small.  You get an injury in the affected area. Get help right away if:  You get an injury and an open wound in the affected area.  You have severe pain that does not get better with medicine.  You have sudden numbness or weakness in the foot or ankle below the affected area, or you have trouble moving your foot or ankle.  You have a fever and you have worse or persistent symptoms.  You have chest pain.  You have shortness of breath. Summary  Chronic venous insufficiency, also called venous stasis, is a condition that prevents blood from being pumped effectively through the veins in your legs.  Chronic venous insufficiency occurs when the vein walls become stretched, weakened, or damaged, or when valves within the vein are damaged.  Treatment for this condition depends on how severe your condition is, and it may involve wearing compression stockings or having a procedure.  Make sure you stay active by exercising, walking, or doing different activities. Ask your health care provider what activities are safe for you and how much exercise you need. This information is not intended to replace advice given to you by your health care provider. Make sure you discuss any questions you have with your health care provider. Document Released: 03/09/2007 Document Revised: 09/22/2016 Document Reviewed: 09/22/2016 Elsevier Interactive Patient Education  2017 [employer].

## 2018-10-12 ENCOUNTER — Other Ambulatory Visit: Payer: Self-pay | Admitting: Family Medicine

## 2018-10-13 ENCOUNTER — Other Ambulatory Visit: Payer: Self-pay | Admitting: Family Medicine

## 2018-10-22 ENCOUNTER — Other Ambulatory Visit (HOSPITAL_COMMUNITY): Payer: Self-pay | Admitting: Nurse Practitioner

## 2018-10-22 ENCOUNTER — Ambulatory Visit: Payer: Medicare Other | Admitting: Family Medicine

## 2018-10-22 ENCOUNTER — Other Ambulatory Visit: Payer: Self-pay | Admitting: Family Medicine

## 2018-10-22 DIAGNOSIS — I10 Essential (primary) hypertension: Secondary | ICD-10-CM

## 2018-10-22 DIAGNOSIS — I872 Venous insufficiency (chronic) (peripheral): Secondary | ICD-10-CM

## 2018-10-22 DIAGNOSIS — I5033 Acute on chronic diastolic (congestive) heart failure: Secondary | ICD-10-CM

## 2018-11-19 ENCOUNTER — Encounter: Payer: Self-pay | Admitting: Family Medicine

## 2018-11-19 ENCOUNTER — Ambulatory Visit: Payer: Medicare Other | Admitting: Family Medicine

## 2018-11-19 VITALS — BP 110/48 | HR 82 | Temp 99.4°F | Wt 123.2 lb

## 2018-11-19 DIAGNOSIS — R109 Unspecified abdominal pain: Secondary | ICD-10-CM

## 2018-11-19 DIAGNOSIS — R300 Dysuria: Secondary | ICD-10-CM

## 2018-11-19 LAB — POCT URINALYSIS DIP (MANUAL ENTRY)
Bilirubin, UA: NEGATIVE
GLUCOSE UA: NEGATIVE mg/dL
Ketones, POC UA: NEGATIVE mg/dL
Leukocytes, UA: NEGATIVE
NITRITE UA: NEGATIVE
PH UA: 7 (ref 5.0–8.0)
Protein Ur, POC: NEGATIVE mg/dL
SPEC GRAV UA: 1.015 (ref 1.010–1.025)
Urobilinogen, UA: 0.2 E.U./dL

## 2018-11-19 MED ORDER — OMEPRAZOLE 20 MG PO CPDR: 20.0000 mg | DELAYED_RELEASE_CAPSULE | Freq: Every day | ORAL | 11 refills | Status: DC

## 2018-11-19 NOTE — Progress Notes (Signed)
Subjective:    Heather [name] - 85 y.o. female MRN [id number]  Date of birth: 1918/05/26  CC:  Heather [name] is here for dysuria and abdominal pain.  HPI: Dysuria - endorses burning on urination and urine that appears more yellow than normal - has been going on for about two weeks - denies urgency or frequency since she is chronically incontinent of urine  Abdominal pain - chronic L sided abdominal pain -Says that she continues to take Pepcid, which is on her med list but not in her medication bag -Unable to describe what makes it better or worse or whether it occurs with meals -Continues to take MiraLAX and Senokot for her constipation and usually has a bowel movement once or twice per day -Says sometimes she strains with bowel movements, but mostly they are normal  Health Maintenance:  Health Maintenance Due  Topic Date Due  . TETANUS/TDAP  06/13/1939  . PNA vac Low Risk Adult (1 of 2 - PCV13) 06/12/1985    -  reports that she has never smoked. She has never used smokeless tobacco. - Review of Systems: Per HPI. - Past Medical History: Patient Active Problem List   Diagnosis Date Noted  . Dysuria 11/19/2018  . Back pain 04/08/2018  . Medication management 02/28/2018  . Sacral pain 02/07/2018  . Urinary incontinence without sensory awareness 12/02/2017  . Arthritis of left hip 10/20/2017  . Functional abdominal pain syndrome 09/02/2017  . Encounter for chronic pain management 07/22/2017  . Dementia ([place]) 06/25/2017  . Vitamin D deficiency 05/21/2017  . Abnormal CT scan of lung 07/16/2015  . Esophageal dysmotility 10/27/2014  . Failure to thrive in adult 04/26/2014  . Arthritis of hand, degenerative 04/10/2014  . Other and unspecified hyperlipidemia 04/10/2014  . LPRD (laryngopharyngeal reflux disease) 04/04/2014  . Chronic bronchitis ([place]) 05/30/2013  . Hx of breast cancer 08/30/2012  . Edema 06/12/2011  . GERD 03/27/2010  . Diastolic CHF ([place]) 47/07/6282   . Constipation 01/18/2009  . Carpal tunnel syndrome on both sides 10/03/2008  . Cervical spondylosis without myelopathy 03/21/2008  . Diaphragmatic hernia 12/26/2007  . Renal cyst 12/26/2007  . Primary hypertension 09/15/2007  . Venous (peripheral) insufficiency 09/15/2007  . Osteoarthritis 09/15/2007  . Lumbago 09/15/2007  . Diverticulosis of colon 05/24/2002   - Medications: reviewed and updated   Objective:   Physical Exam BP (!) 110/48   Pulse 82   Temp 99.4 F (37.4 C) (Oral)   Wt 123 lb 4 oz (55.9 kg)   SpO2 96%   BMI 24.89 kg/m  Gen: NAD, alert, cooperative with exam, well-appearing CV: RRR, good S1/S2, no murmur, 2+ pitting edema bilaterally Resp: CTABL, no wheezes, non-labored Abd: Tender to palpation, especially in left lower quadrant, similar to previous exams, normal bowel sounds, no guarding Psych: Poor to fair insight, alert and oriented        Assessment & Plan:   Dysuria Will obtain a UA today to see whether her dysuria may be due to a UTI.  History is unreliable from this patient, especially since she chronically has urinary urgency and frequency.  Functional abdominal pain syndrome This is a chronic complaint of this patient, and she has had multiple rounds of lab work and CT scans previously, which have not revealed a cause of her pain.  Encouraged patient to use more MiraLAX if she feels like she is constipated and prescribed omeprazole to see if this may help.  Discontinued Pepcid from her med list.  Her pain is likely due in part to her hip arthritis and in part to her anxiety.    Maia Breslow, M.D. 11/19/2018, 5:24 PM PGY-2, [place]

## 2018-11-19 NOTE — Patient Instructions (Signed)
It was nice seeing you today Ms. Poulson!  For your burning on urination, we are going to test your urine today.  I will call you if it is not normal.  For your stomach pain, we will add Prilosec, also called omeprazole, which you will take once per day.  Please continue all of your other medications as prescribed.  If you have any questions or concerns, please feel free to call the clinic.   Be well,  Dr. Shan Levans

## 2018-11-19 NOTE — Assessment & Plan Note (Signed)
This is a chronic complaint of this patient, and she has had multiple rounds of lab work and CT scans previously, which have not revealed a cause of her pain.  Encouraged patient to use more MiraLAX if she feels like she is constipated and prescribed omeprazole to see if this may help.  Discontinued Pepcid from her med list.  Her pain is likely due in part to her hip arthritis and in part to her anxiety.

## 2018-11-19 NOTE — Assessment & Plan Note (Signed)
Will obtain a UA today to see whether her dysuria may be due to a UTI.  History is unreliable from this patient, especially since she chronically has urinary urgency and frequency.

## 2018-11-26 ENCOUNTER — Telehealth: Payer: Self-pay | Admitting: Cardiovascular Disease

## 2018-11-26 NOTE — Telephone Encounter (Signed)
Patient calling for her lab results

## 2018-11-26 NOTE — Telephone Encounter (Signed)
Patient called about wanting her lab results. Informed patient that she does not have any recent lab work with our office. Informed patient that her PCP would need to talk to her about her lab results. Patient stated she has tried to call her PCP, but she could not get in touch with them. Patient asked if I could please get a message to them to call her. Informed patient that I could route a note to her PCP, Dr. Shan Levans. Patient thanked me for the call. Will route call to Dr. Shan Levans.

## 2018-12-05 ENCOUNTER — Emergency Department (HOSPITAL_COMMUNITY): Payer: Medicare Other

## 2018-12-05 ENCOUNTER — Encounter (HOSPITAL_COMMUNITY): Payer: Self-pay | Admitting: Emergency Medicine

## 2018-12-05 ENCOUNTER — Other Ambulatory Visit: Payer: Self-pay

## 2018-12-05 ENCOUNTER — Emergency Department (HOSPITAL_COMMUNITY)
Admission: EM | Admit: 2018-12-05 | Discharge: 2018-12-05 | Disposition: A | Discharge status: Home / Self Care | Payer: Medicare Other | Attending: Emergency Medicine | Admitting: Emergency Medicine

## 2018-12-05 DIAGNOSIS — R072 Precordial pain: Secondary | ICD-10-CM | POA: Insufficient documentation

## 2018-12-05 DIAGNOSIS — M79604 Pain in right leg: Secondary | ICD-10-CM | POA: Diagnosis not present

## 2018-12-05 DIAGNOSIS — M7989 Other specified soft tissue disorders: Secondary | ICD-10-CM | POA: Diagnosis not present

## 2018-12-05 DIAGNOSIS — F0390 Unspecified dementia without behavioral disturbance: Secondary | ICD-10-CM | POA: Insufficient documentation

## 2018-12-05 DIAGNOSIS — Z853 Personal history of malignant neoplasm of breast: Secondary | ICD-10-CM | POA: Insufficient documentation

## 2018-12-05 DIAGNOSIS — R0602 Shortness of breath: Secondary | ICD-10-CM | POA: Diagnosis not present

## 2018-12-05 DIAGNOSIS — M79605 Pain in left leg: Secondary | ICD-10-CM | POA: Insufficient documentation

## 2018-12-05 DIAGNOSIS — R05 Cough: Secondary | ICD-10-CM | POA: Diagnosis not present

## 2018-12-05 DIAGNOSIS — R059 Cough, unspecified: Secondary | ICD-10-CM

## 2018-12-05 DIAGNOSIS — R1013 Epigastric pain: Secondary | ICD-10-CM | POA: Insufficient documentation

## 2018-12-05 DIAGNOSIS — I5030 Unspecified diastolic (congestive) heart failure: Secondary | ICD-10-CM | POA: Insufficient documentation

## 2018-12-05 DIAGNOSIS — I110 Hypertensive heart disease with heart failure: Secondary | ICD-10-CM | POA: Insufficient documentation

## 2018-12-05 DIAGNOSIS — M79606 Pain in leg, unspecified: Secondary | ICD-10-CM

## 2018-12-05 LAB — CBC WITH DIFFERENTIAL/PLATELET
Abs Immature Granulocytes: 0.02 10*3/uL (ref 0.00–0.07)
Basophils Absolute: 0 10*3/uL (ref 0.0–0.1)
Basophils Relative: 1 %
EOS ABS: 0.1 10*3/uL (ref 0.0–0.5)
Eosinophils Relative: 1 %
HEMATOCRIT: 40.7 % (ref 36.0–46.0)
Hemoglobin: 12.9 g/dL (ref 12.0–15.0)
Immature Granulocytes: 0 %
Lymphocytes Relative: 31 %
Lymphs Abs: 1.9 10*3/uL (ref 0.7–4.0)
MCH: 31.1 pg (ref 26.0–34.0)
MCHC: 31.7 g/dL (ref 30.0–36.0)
MCV: 98.1 fL (ref 80.0–100.0)
Monocytes Absolute: 0.9 10*3/uL (ref 0.1–1.0)
Monocytes Relative: 15 %
NEUTROS PCT: 52 %
Neutro Abs: 3.4 10*3/uL (ref 1.7–7.7)
PLATELETS: 184 10*3/uL (ref 150–400)
RBC: 4.15 MIL/uL (ref 3.87–5.11)
RDW: 13.2 % (ref 11.5–15.5)
WBC: 6.3 10*3/uL (ref 4.0–10.5)
nRBC: 0 % (ref 0.0–0.2)

## 2018-12-05 LAB — COMPREHENSIVE METABOLIC PANEL
ALBUMIN: 4.1 g/dL (ref 3.5–5.0)
ALK PHOS: 34 U/L — AB (ref 38–126)
ALT: 19 U/L (ref 0–44)
AST: 27 U/L (ref 15–41)
Anion gap: 10 (ref 5–15)
BUN: 24 mg/dL — AB (ref 8–23)
CALCIUM: 9.4 mg/dL (ref 8.9–10.3)
CO2: 27 mmol/L (ref 22–32)
CREATININE: 0.92 mg/dL (ref 0.44–1.00)
Chloride: 104 mmol/L (ref 98–111)
GFR calc Af Amer: >60 mL/min (ref >60)
GFR calc non Af Amer: 52 mL/min — ABNORMAL LOW (ref >60)
GLUCOSE: 92 mg/dL (ref 70–99)
Potassium: 3.9 mmol/L (ref 3.5–5.1)
Sodium: 141 mmol/L (ref 135–145)
TOTAL PROTEIN: 6.6 g/dL (ref 6.5–8.1)
Total Bilirubin: 1.1 mg/dL (ref 0.3–1.2)

## 2018-12-05 LAB — LIPASE, BLOOD: Lipase: 37 U/L (ref 11–51)

## 2018-12-05 LAB — TROPONIN I: Troponin I: <0.03 ng/mL (ref <0.03)

## 2018-12-05 LAB — BRAIN NATRIURETIC PEPTIDE: B Natriuretic Peptide: 184.3 pg/mL — ABNORMAL HIGH (ref 0.0–100.0)

## 2018-12-05 MED ORDER — ALUM & MAG HYDROXIDE-SIMETH 200-200-20 MG/5ML PO SUSP
15.0000 mL | Freq: Once | ORAL | Status: AC
  Administered 2018-12-05: 15 mL via ORAL
  Filled 2018-12-05: qty 30

## 2018-12-05 NOTE — ED Notes (Signed)
Son called and he is on his way to pick up his mother.

## 2018-12-05 NOTE — ED Triage Notes (Signed)
Pt c/o bilateral leg pain. Upper abdominal pain when she coughs. Pt stated that she has been having chest pain when she breaths or coughs. x 2 days.Hx of recurrent low back pain. Pt was seen by PCP on 11/19/2018. Patient was expecting pain medication. Pt lives with her son. Brought to ED by son.

## 2018-12-05 NOTE — ED Provider Notes (Signed)
Emergency Department Provider Note   I have reviewed the triage vital signs and the nursing notes.   HISTORY  Chief Complaint Shortness of Breath; Cough; and Leg Pain   HPI Heather [name] is a 85 y.o. female with PMH of HTN, dCHF, and venous insufficiency presents to the emergency department for evaluation of cough with associated epigastric/lower chest discomfort.  Patient describes "phlegm" when she is trying to cough and get out.  Her pain occurs when she is coughing or trying to clear her throat.  No significant pain at rest.  Symptoms began yesterday and have continued.  She has discussed the cough with her PCP and has been taking Mucinex with no significant relief.  She is also noticed bilateral lower extremity leg swelling.  She is wearing compression stockings and trying to keep the legs elevated.  She remains active at home and lives alone with her son living across the street to provide assistance as needed.  She is not experiencing any significant shortness of breath. No fever/chills.    Past Medical History:  Diagnosis Date  . Allergic rhinitis, cause unspecified 02/21/2014  . Anxiety state, unspecified   . Breast cancer ([place])    NO STICK OR BLOOD PRESSURE CHECKS IN LEFT ARM  . Carpal tunnel syndrome   . Cervical spondylosis   . Colon polyp 12/19/2009   Transverse-polypoid colorectal mucosa  . Constipation   . Degenerative joint disease   . Diastolic dysfunction   . Diverticula of colon   . Gastritis, chronic   . Hiatal hernia   . Hypertension   . Low back pain   . RBBB (right bundle branch block with left anterior fascicular block)   . Renal cyst   . Venous insufficiency     Patient Active Problem List   Diagnosis Date Noted  . Dysuria 11/19/2018  . Back pain 04/08/2018  . Medication management 02/28/2018  . Sacral pain 02/07/2018  . Urinary incontinence without sensory awareness 12/02/2017  . Arthritis of left hip 10/20/2017  . Functional abdominal  pain syndrome 09/02/2017  . Encounter for chronic pain management 07/22/2017  . Dementia ([place]) 06/25/2017  . Vitamin D deficiency 05/21/2017  . Abnormal CT scan of lung 07/16/2015  . Esophageal dysmotility 10/27/2014  . Failure to thrive in adult 04/26/2014  . Arthritis of hand, degenerative 04/10/2014  . Other and unspecified hyperlipidemia 04/10/2014  . LPRD (laryngopharyngeal reflux disease) 04/04/2014  . Chronic bronchitis ([place]) 05/30/2013  . Hx of breast cancer 08/30/2012  . Edema 06/12/2011  . GERD 03/27/2010  . Diastolic CHF ([place]) 00/17/4944  . Constipation 01/18/2009  . Carpal tunnel syndrome on both sides 10/03/2008  . Cervical spondylosis without myelopathy 03/21/2008  . Diaphragmatic hernia 12/26/2007  . Renal cyst 12/26/2007  . Primary hypertension 09/15/2007  . Venous (peripheral) insufficiency 09/15/2007  . Osteoarthritis 09/15/2007  . Lumbago 09/15/2007  . Diverticulosis of colon 05/24/2002    Past Surgical History:  Procedure Laterality Date  . CARPAL TUNNEL RELEASE    . Left mastectomy      Allergies Fentanyl  Family History  Problem Relation Age of Onset  . Colon cancer Neg Hx     Social History Social History   Tobacco Use  . Smoking status: Never Smoker  . Smokeless tobacco: Never Used  Substance Use Topics  . Alcohol use: No    Alcohol/week: 0.0 standard drinks  . Drug use: No    Review of Systems  Constitutional: No fever/chills Eyes: No visual  changes. ENT: No sore throat. Cardiovascular: Positive chest pain with cough. Positive bilateral LE swelling.  Respiratory: Denies shortness of breath. Positive cough.  Gastrointestinal: No abdominal pain.  No nausea, no vomiting.  No diarrhea.  No constipation. Genitourinary: Negative for dysuria. Musculoskeletal: Negative for back pain. Skin: Negative for rash. Neurological: Negative for headaches, focal weakness or numbness.  10-point ROS otherwise  negative.  ____________________________________________   PHYSICAL EXAM:  VITAL SIGNS: ED Triage Vitals  Enc Vitals Group     BP 12/05/18 2053 138/68     Pulse Rate 12/05/18 2053 75     Resp 12/05/18 2053 20     Temp 12/05/18 2053 97.9 F (36.6 C)     Temp Source 12/05/18 2053 Oral     SpO2 12/05/18 2053 99 %     Weight 12/05/18 2055 120 lb (54.4 kg)     Pain Score 12/05/18 2054 10   Constitutional: Alert and oriented. Well appearing and in no acute distress. Eyes: Conjunctivae are normal.  Head: Atraumatic. Nose: No congestion/rhinnorhea. Mouth/Throat: Mucous membranes are moist.  Neck: No stridor. Cardiovascular: Normal rate, regular rhythm. Good peripheral circulation. Grossly normal heart sounds.   Respiratory: Normal respiratory effort.  No retractions. Lungs CTAB. Gastrointestinal: Soft with very mild epigastric tenderness. No lower abdominal or flank discomfort. No distention.  Musculoskeletal: No lower extremity tenderness with 1+ pitting edema. No gross deformities of extremities. Neurologic:  Normal speech and language. No gross focal neurologic deficits are appreciated.  Skin:  Skin is warm, dry and intact. No rash noted.  ____________________________________________   LABS (all labs ordered are listed, but only abnormal results are displayed)  Labs Reviewed  COMPREHENSIVE METABOLIC PANEL - Abnormal; Notable for the following components:      Result Value   BUN 24 (*)    Alkaline Phosphatase 34 (*)    GFR calc non Af Amer 52 (*)    All other components within normal limits  BRAIN NATRIURETIC PEPTIDE - Abnormal; Notable for the following components:   B Natriuretic Peptide 184.3 (*)    All other components within normal limits  LIPASE, BLOOD  TROPONIN I  CBC WITH DIFFERENTIAL/PLATELET   ____________________________________________  EKG   EKG Interpretation  Date/Time:  Sunday December 05 2018 21:06:49 EST Ventricular Rate:  72 PR Interval:    QRS  Duration: 138 QT Interval:  458 QTC Calculation: 502 R Axis:   -64 Text Interpretation:  Sinus rhythm RBBB and LAFB Probable left ventricular hypertrophy Anterolateral infarct, age indeterminate No STEMI.  Confirmed by Nanda Quinton [phone]) on 12/05/2018 10:11:12 PM       ____________________________________________  RADIOLOGY  Dg Chest 2 View  Result Date: 12/05/2018 CLINICAL DATA:  Shortness of breath EXAM: CHEST - 2 VIEW COMPARISON:  12/07/2017 FINDINGS: Hyperinflated lungs. No focal opacity or pleural effusion. Stable cardiomediastinal silhouette with aortic atherosclerosis. No definitive pneumothorax. Soft tissue and skin fold artifact along the right lateral thorax. Old right-sided rib fractures. IMPRESSION: No active cardiopulmonary disease. Electronically Signed   By: Donavan Foil M.D.   On: 12/05/2018 21:41    ____________________________________________   PROCEDURES  Procedure(s) performed:   Procedures  None ____________________________________________   INITIAL IMPRESSION / ASSESSMENT AND PLAN / ED COURSE  Pertinent labs & imaging results that were available during my care of the patient were reviewed by me and considered in my medical decision making (see chart for details).  Patient presents to the emergency department for evaluation of cough, lower extremity swelling.  She is  having some epigastric/lower chest discomfort with coughing.  No additional abdominal discomfort.  No rebound or guarding.  No specific GI symptoms.  Extremely low suspicion for acute coronary syndrome.  Given the patient's age I do plan for more extensive work-up including troponin.  Very low suspicion for PE.  Chest x-ray reviewed with no acute findings.   11:25 PM Patient labs and imaging reviewed. No acute findings. Doubt GI source. Pain mainly with coughing. Plan PCP follow up and will increase lasix to BID dosing. Discussed ED return precautions.   ____________________________________________  FINAL CLINICAL IMPRESSION(S) / ED DIAGNOSES  Final diagnoses:  Precordial pain  Cough  Pain and swelling of lower extremity, unspecified laterality    MEDICATIONS GIVEN DURING THIS VISIT:  Medications  alum & mag hydroxide-simeth (MAALOX/MYLANTA) 200-200-20 MG/5ML suspension 15 mL (15 mLs Oral Given 12/05/18 2202)    Note:  This document was prepared using Dragon voice recognition software and may include unintentional dictation errors.  Nanda Quinton, MD Emergency Medicine    Terre Zabriskie, [facility], MD 12/05/18 [phone]

## 2018-12-05 NOTE — Discharge Instructions (Signed)
You were seen in the ED today with cough, pain, and leg swelling. Your labs and chest x-ray were normal. You have take your Lasix twice per day for the next 3 days and then return to the regular daily dosing. Call your PCP tomorrow to schedule a follow up appointment. Return to the ED with any new or worsening symptoms.

## 2018-12-07 ENCOUNTER — Other Ambulatory Visit: Payer: Self-pay | Admitting: Family Medicine

## 2018-12-10 ENCOUNTER — Ambulatory Visit: Payer: Medicare Other | Admitting: Family Medicine

## 2018-12-10 ENCOUNTER — Other Ambulatory Visit: Payer: Self-pay

## 2018-12-10 ENCOUNTER — Encounter: Payer: Self-pay | Admitting: Family Medicine

## 2018-12-10 VITALS — BP 102/62 | HR 87 | Temp 97.9°F | Ht 59.0 in | Wt 123.8 lb

## 2018-12-10 DIAGNOSIS — R600 Localized edema: Secondary | ICD-10-CM | POA: Diagnosis not present

## 2018-12-10 DIAGNOSIS — R300 Dysuria: Secondary | ICD-10-CM

## 2018-12-10 LAB — POCT UA - MICROSCOPIC ONLY

## 2018-12-10 LAB — POCT URINALYSIS DIP (MANUAL ENTRY)
Bilirubin, UA: NEGATIVE
Glucose, UA: NEGATIVE mg/dL
Ketones, POC UA: NEGATIVE mg/dL
Nitrite, UA: NEGATIVE
Protein Ur, POC: NEGATIVE mg/dL
Spec Grav, UA: 1.015 (ref 1.010–1.025)
Urobilinogen, UA: 0.2 E.U./dL
pH, UA: 6.5 (ref 5.0–8.0)

## 2018-12-10 MED ORDER — CEPHALEXIN 500 MG PO CAPS: 500.0000 mg | ORAL_CAPSULE | Freq: Two times a day (BID) | ORAL | 0 refills | Status: DC

## 2018-12-10 NOTE — Assessment & Plan Note (Signed)
UA shows trace leukocytes and small hemoglobin.  Will treat for a UTI given these results and patient's reported dysuria.  Prescription for Keflex 500 mg twice daily for 7 days sent to pharmacy.  Patient counseled on how to take this medication, and instructions written on AVS.

## 2018-12-10 NOTE — Progress Notes (Signed)
Subjective:    Heather [name] - 85 y.o. female MRN [id number]  Date of birth: 1918-01-01  CC:  Heather [name] is here for dysuria.  She would also like to discuss her bilateral lower extremity swelling.  HPI: Dysuria -Has had burning on urination for the last 3 weeks -Has had no constitutional symptoms -Urgency and frequency are hard to elucidate since patient has chronic bladder incontinence, but she does not note any changes with either of the symptoms -No changes in vaginal discharge -Endorses some suprapubic pain, but she does have chronic abdominal pain  Lower extremity swelling -Says that she took Lasix 80 mg after her visit to the emergency department for her swelling, but it did not make any difference in her swelling -Denies shortness of breath or orthopnea -Tries to keep her legs elevated when she can   Health Maintenance:  Health Maintenance Due  Topic Date Due  . TETANUS/TDAP  06/13/1939  . PNA vac Low Risk Adult (1 of 2 - PCV13) 06/12/1985    -  reports that she has never smoked. She has never used smokeless tobacco. - Review of Systems: Per HPI. - Past Medical History: Patient Active Problem List   Diagnosis Date Noted  . Dysuria 11/19/2018  . Back pain 04/08/2018  . Medication management 02/28/2018  . Sacral pain 02/07/2018  . Urinary incontinence without sensory awareness 12/02/2017  . Arthritis of left hip 10/20/2017  . Functional abdominal pain syndrome 09/02/2017  . Encounter for chronic pain management 07/22/2017  . Dementia ([place]) 06/25/2017  . Vitamin D deficiency 05/21/2017  . Abnormal CT scan of lung 07/16/2015  . Esophageal dysmotility 10/27/2014  . Failure to thrive in adult 04/26/2014  . Arthritis of hand, degenerative 04/10/2014  . Other and unspecified hyperlipidemia 04/10/2014  . LPRD (laryngopharyngeal reflux disease) 04/04/2014  . Chronic bronchitis ([place]) 05/30/2013  . Hx of breast cancer 08/30/2012  . Edema 06/12/2011  .  GERD 03/27/2010  . Diastolic CHF ([place]) 78/24/2353  . Constipation 01/18/2009  . Carpal tunnel syndrome on both sides 10/03/2008  . Cervical spondylosis without myelopathy 03/21/2008  . Diaphragmatic hernia 12/26/2007  . Renal cyst 12/26/2007  . Primary hypertension 09/15/2007  . Venous (peripheral) insufficiency 09/15/2007  . Osteoarthritis 09/15/2007  . Lumbago 09/15/2007  . Diverticulosis of colon 05/24/2002   - Medications: reviewed and updated   Objective:   Physical Exam BP 102/62   Pulse 87   Temp 97.9 F (36.6 C) (Oral)   Ht 4\' 11"  (1.499 m)   Wt 123 lb 12.8 oz (56.2 kg)   SpO2 98%   BMI 25.00 kg/m  Gen: NAD, alert, cooperative with exam, appears elderly but less than stated age CV: RRR, good S1/S2, no murmur, 2+ pitting edema up to knees Resp: CTABL, no wheezes, non-labored Abd: Tender to palpation along left and middle lower abdomen consistent with previous exams, no guarding or organomegaly     Assessment & Plan:   Dysuria UA shows trace leukocytes and small hemoglobin.  Will treat for a UTI given these results and patient's reported dysuria.  Prescription for Keflex 500 mg twice daily for 7 days sent to pharmacy.  Patient counseled on how to take this medication, and instructions written on AVS.  Edema Likely due to venous stasis.  Patient counseled again that these are age-related changes that will likely not improve with time or increased Lasix dose.  Since patient did not find Lasix 80 mg to be helpful, she was encouraged to  continue her current dose of 40 mg daily.    Maia Breslow, M.D. 12/10/2018, 12:23 PM PGY-2, [place]

## 2018-12-10 NOTE — Assessment & Plan Note (Signed)
Likely due to venous stasis.  Patient counseled again that these are age-related changes that will likely not improve with time or increased Lasix dose.  Since patient did not find Lasix 80 mg to be helpful, she was encouraged to continue her current dose of 40 mg daily.

## 2018-12-10 NOTE — Patient Instructions (Addendum)
It was nice seeing you today Ms. Deblanc!  For your urinary tract infection, I would like for you to take the antibiotic called Keflex 1 pill in the morning and 1 pill at night.  You should take this medication for 7 days.  Please continue to take 1 pill of Lasix per day if you find this helpful for your leg swelling.  Your leg swelling will not get better with medications because it is an age-related issue.  Your veins have lost their elasticity, so you will likely always have fluid in your legs.  Try to keep them elevated as much as possible and continue wearing your compression stockings.  If you have any questions or concerns, please feel free to call the clinic.   Be well,  Dr. Shan Levans

## 2018-12-14 NOTE — Progress Notes (Signed)
CARDIOLOGY OFFICE NOTE  Date:  12/17/2018    Heather [name] Date of Birth: [birth year] Medical Record #[id number]  PCP:  [name], MD  Cardiologist:  Andrez Grime chief complaint on file.   History of Present Illness: Heather [name] is a 85 y.o. female who presents today for a follow up visit.    She has a chronic diastolic CHF, HTN, atypical CP s/p normal myoview in 2007 and 2009, breast CA s/p L mastectomy, chronic phlegm/cough, chronic edema, anxiety, depression, & diverticulosis/gastritis/functional abdominal pain. She has multiple somatic complaints covering the gi, respiratory and ortho systems Seen in ER recently for LE edema witch is chronic from venous stasis Her dementia is worsening   No cardiac issues    Past Medical History:  Diagnosis Date  . Allergic rhinitis, cause unspecified 02/21/2014  . Anxiety state, unspecified   . Breast cancer ([place])    NO STICK OR BLOOD PRESSURE CHECKS IN LEFT ARM  . Carpal tunnel syndrome   . Cervical spondylosis   . Colon polyp 12/19/2009   Transverse-polypoid colorectal mucosa  . Constipation   . Degenerative joint disease   . Diastolic dysfunction   . Diverticula of colon   . Gastritis, chronic   . Hiatal hernia   . Hypertension   . Low back pain   . RBBB (right bundle branch block with left anterior fascicular block)   . Renal cyst   . Venous insufficiency     Past Surgical History:  Procedure Laterality Date  . CARPAL TUNNEL RELEASE    . Left mastectomy       Medications: Current Meds  Medication Sig  . Acetaminophen-Codeine 300-15 MG TABS TAKE 1 TABLET BY MOUTH EVERY 4 HOURS AS NEEDED FOR MODERATE PAIN  . ALPRAZolam (XANAX) 0.25 MG tablet Take 1 tablet (0.25 mg total) by mouth 2 (two) times daily as needed for anxiety.  . carvedilol (COREG) 3.125 MG tablet TAKE 1 TABLET BY MOUTH TWICE DAILY  . cephALEXin (KEFLEX) 500 MG capsule Take 1 capsule (500 mg total) by mouth 2 (two) times daily.  .  cholecalciferol (VITAMIN D) 1000 units tablet Take 1,000 Units by mouth daily.  . feeding supplement, ENSURE ENLIVE, (ENSURE ENLIVE) LIQD Take 237 mLs by mouth 2 (two) times daily between meals.  . furosemide (LASIX) 40 MG tablet TAKE 1 TABLET BY MOUTH ONCE DAILY  . levocetirizine (XYZAL) 5 MG tablet Take 5 mg by mouth every evening.  . Multiple Vitamins-Minerals (MULTIVITAMINS THER. W/MINERALS) TABS Take 1 tablet by mouth daily.  Marland Kitchen omeprazole (PRILOSEC) 20 MG capsule Take 1 capsule (20 mg total) by mouth daily.  . polyethylene glycol powder (GLYCOLAX/MIRALAX) powder Take 17 g by mouth daily as needed.  . senna (SENOKOT) 8.6 MG TABS tablet Take 1 tablet (8.6 mg total) by mouth daily.  Marland Kitchen spironolactone (ALDACTONE) 25 MG tablet TAKE 1 TABLET BY MOUTH ONCE DAILY     Allergies: Allergies  Allergen Reactions  . Fentanyl Nausea Only and Other (See Comments)    Pt states that she is not allergic  Nausea only.     Social History: The patient  reports that she has never smoked. She has never used smokeless tobacco. She reports that she does not drink alcohol or use drugs.   Family History: The patient's family history is not on file.   Review of Systems: Please see the history of present illness.   Otherwise, the review of systems is positive for  none.   All other systems are reviewed and negative.   Physical Exam: VS:  BP (!) 112/54   Pulse 82   Ht 4\' 11"  (1.499 m)   Wt 106 lb (48.1 kg)   BMI 21.41 kg/m  .  BMI Body mass index is 21.41 kg/m.  Wt Readings from Last 3 Encounters:  12/17/18 106 lb (48.1 kg)  12/10/18 123 lb 12.8 oz (56.2 kg)  12/05/18 120 lb (54.4 kg)   Affect appropriate Elderly Elderly black female  HEENT: normal Neck supple with no adenopathy JVP normal no bruits no thyromegaly Lungs clear with no wheezing and good diaphragmatic motion Heart:  S1/S2 no murmur, no rub, gallop or click PMI normal Abdomen: benighn, BS positve, no tenderness, no AAA no bruit.   No HSM or HJR Distal pulses intact with no bruits Plus 2 LE edema with stasis  Neuro non-focal Skin warm and dry No muscular weakness   LABORATORY DATA:  EKG:   SR RBBB / LAFB 12/06/18  Lab Results  Component Value Date   WBC 6.3 12/05/2018   HGB 12.9 12/05/2018   HCT 40.7 12/05/2018   PLT 184 12/05/2018   GLUCOSE 92 12/05/2018   CHOL 199 02/02/2014   TRIG 20.0 02/02/2014   HDL 98.70 02/02/2014   LDLCALC 96 02/02/2014   ALT 19 12/05/2018   AST 27 12/05/2018   NA 141 12/05/2018   K 3.9 12/05/2018   CL 104 12/05/2018   CREATININE 0.92 12/05/2018   BUN 24 (H) 12/05/2018   CO2 27 12/05/2018   TSH 2.67 11/30/2014       BNP (last 3 results) Recent Labs    06/21/18 1628 12/05/18 2150  BNP 122.5* 184.3*    ProBNP (last 3 results) No results for input(s): PROBNP in the last 8760 hours.   Other Studies Reviewed Today:   Assessment/Plan:  1. HTN - Well controlled.  Continue current medications and low sodium Dash type diet.    2. Chronic swelling - has venous insufficiency and diastolic HF - continue diuretic   3. Dementia - worsening f/u with primary consider aricept or namenda    Current medicines are reviewed with the patient today.  The patient does not have concerns regarding medicines other than what has been noted above.  The following changes have been made:  See above.  Labs/ tests ordered today include:   No orders of the defined types were placed in this encounter.    Disposition:   FU prn.   Patient is agreeable to this plan and will call if any problems develop in the interim.   Signed: Jenkins Rouge, MD  12/17/2018 10:26 AM  [facility] [address] [place], [place]  [address] Phone: [phone] Fax: [phone]

## 2018-12-17 ENCOUNTER — Ambulatory Visit: Payer: Medicare Other | Admitting: Cardiovascular Disease

## 2018-12-17 VITALS — BP 112/54 | HR 82 | Ht 59.0 in | Wt 106.0 lb

## 2018-12-17 DIAGNOSIS — I10 Essential (primary) hypertension: Secondary | ICD-10-CM | POA: Diagnosis not present

## 2018-12-17 DIAGNOSIS — I5032 Chronic diastolic (congestive) heart failure: Secondary | ICD-10-CM | POA: Diagnosis not present

## 2018-12-17 NOTE — Patient Instructions (Addendum)
Medication Instructions:   If you need a refill on your cardiac medications before your next appointment, please call your pharmacy.   Lab work:  If you have labs (blood work) drawn today and your tests are completely normal, you will receive your results only by: . MyChart Message (if you have MyChart) OR . A paper copy in the mail If you have any lab test that is abnormal or we need to change your treatment, we will call you to review the results.  Testing/Procedures: None ordered today.  Follow-Up: At [facility], you and your health needs are our priority.  As part of our continuing mission to provide you with exceptional heart care, we have created designated Provider Care Teams.  These Care Teams include your primary Cardiologist (physician) and Advanced Practice Providers (APPs -  Physician Assistants and Nurse Practitioners) who all work together to provide you with the care you need, when you need it. Your physician recommends that you schedule a follow-up appointment as needed with Dr. Nishan.   

## 2018-12-25 DIAGNOSIS — M25511 Pain in right shoulder: Secondary | ICD-10-CM | POA: Diagnosis not present

## 2018-12-28 ENCOUNTER — Ambulatory Visit: Payer: Medicare Other | Admitting: Family Medicine

## 2018-12-29 ENCOUNTER — Other Ambulatory Visit: Payer: Self-pay

## 2018-12-29 ENCOUNTER — Ambulatory Visit (INDEPENDENT_AMBULATORY_CARE_PROVIDER_SITE_OTHER): Payer: Medicare Other | Admitting: Family Medicine

## 2018-12-29 ENCOUNTER — Encounter: Payer: Self-pay | Admitting: Family Medicine

## 2018-12-29 VITALS — BP 98/50 | HR 81 | Temp 98.5°F | Ht 59.0 in | Wt 125.0 lb

## 2018-12-29 DIAGNOSIS — I872 Venous insufficiency (chronic) (peripheral): Secondary | ICD-10-CM | POA: Diagnosis not present

## 2018-12-29 DIAGNOSIS — K5900 Constipation, unspecified: Secondary | ICD-10-CM

## 2018-12-29 DIAGNOSIS — R1032 Left lower quadrant pain: Secondary | ICD-10-CM

## 2018-12-29 DIAGNOSIS — M170 Bilateral primary osteoarthritis of knee: Secondary | ICD-10-CM

## 2018-12-29 DIAGNOSIS — G8929 Other chronic pain: Secondary | ICD-10-CM

## 2018-12-29 NOTE — Assessment & Plan Note (Signed)
Explained to patient that her knee pain is likely secondary to her osteoarthritis.  Advised patient that she should contact Dr. Durward Fortes for consider getting another steroid injection as this would likely be beneficial to her. -Continue BenGay as needed

## 2018-12-29 NOTE — Progress Notes (Signed)
Subjective: Chief Complaint  Patient presents with  . knee and shoulder pain    fell 1 week ago (Thursday)     HPI: Heather [name] is a 85 y.o. presenting to clinic today to discuss the following:  1 recent fall Patient states that she fell and hit her right shoulder blade on a table 1 week ago.  She states that she had someone come to her house and do an x-ray.  Her son states that they were told that she had an old fracture there, but nothing new.  Patient denies any pain in that location at present.  She states that she is more concerned about her right knee pain and that it "gives out."  2 right knee pain Patient has a longstanding history of right knee pain.  On chart review films in 2006 revealed osteo-arthritis in all 3 compartments.  She states that it occasionally swells.  States that she sees Dr. Durward Fortes for this and occasionally receives injections, but states it is been a while.  She states that she uses topical pain relievers such as icy hot and BenGay with some relief.  Denies that this pain is any different from previous pain.  3 chronic left lower quadrant pain Patient has a history of chronic left lower quadrant pain for quite some time.  She has had 2 recent CT scans in June 2019 and September 2019, which revealed only constipation.  She states that this pain improves following bowel movements.  She states that she has not had a decrease in appetite, and eating does not make the pain worse.  She states that she is constantly in pain, but it gets worse when she needs to have a bowel movement.  States that her bowels are usually well formed.  She has been taking MiraLAX twice daily as well as Senokot daily.  She is very concerned about this abdominal pain.  4 Leg Swelling She has a chronic history of swelling, and has had ultrasounds that were negative.  She has been diagnosed with venous insufficiency and been prescribed compression stockings, which she has not worn.   She continues to state that she is concerned about her swelling in her legs.  She states that she keeps them elevated, but it does not help.     ROS noted in HPI.   Past Medical, Surgical, Social, and Family History Reviewed & Updated per EMR.   Pertinent Historical Findings include:   Social History   Tobacco Use  Smoking Status Never Smoker  Smokeless Tobacco Never Used      Objective: BP (!) 98/50   Pulse 81   Temp 98.5 F (36.9 C) (Oral)   Ht 4\' 11"  (1.499 m)   Wt 125 lb (56.7 kg)   SpO2 98%   BMI 25.25 kg/m  Vitals and nursing notes reviewed  Physical Exam: General: 85 y.o. female in NAD Cardio: RRR no m/r/g Lungs: CTAB, no wheezing, no rhonchi, no crackles Abdomen: Soft, mild tenderness to palpation of left lower quadrant, positive bowel sounds Skin: warm and dry Extremities: 0.5+ pitting edema bilateral lower extremities, no tenderness to palpation of right knee   No results found for this or any previous visit (from the past 72 hour(s)).  Assessment/Plan:  Venous (peripheral) insufficiency Again reiterated that patient has venous insufficiency due to her age and explained this to the patient.  Continue to recommend support stockings and to continue with elevation.  Explained to the patient that the  stockings she has on currently are not tight enough and will not help improve the swelling.  Recommend follow-up with PCP.  Constipation Suspect that this is also secondary to constipation.  Reassured as patient has had 2- CT scans with most recent being less than 6 months ago.  Patient is mildly tender, but no rebound noted on exam.  Reassured that she improves following bowel movements. -Continue MiraLAX twice daily -Continue Senokot daily - Add Metamucil or fiber supplementation to regimen -Return precautions given, follow-up if does not improve  Osteoarthritis Explained to patient that her knee pain is likely secondary to her osteoarthritis.  Advised  patient that she should contact Dr. Durward Fortes for consider getting another steroid injection as this would likely be beneficial to her. -Continue BenGay as needed     PATIENT EDUCATION PROVIDED: See AVS    Diagnosis and plan along with any newly prescribed medication(s) were discussed in detail with this patient today. The patient verbalized understanding and agreed with the plan. Patient advised if symptoms worsen return to clinic or ER.     No orders of the defined types were placed in this encounter.   No orders of the defined types were placed in this encounter.    [place] Constable, DO 12/29/2018, 8:29 PM PGY-1 [place]

## 2018-12-29 NOTE — Assessment & Plan Note (Signed)
Again reiterated that patient has venous insufficiency due to her age and explained this to the patient.  Continue to recommend support stockings and to continue with elevation.  Explained to the patient that the stockings she has on currently are not tight enough and will not help improve the swelling.  Recommend follow-up with PCP.

## 2018-12-29 NOTE — Patient Instructions (Signed)
Thank you for coming to see me today. It was a pleasure. Today we talked about:   Your knee pain: See Dr. Durward Fortes about getting another injection as it is from your arthritis.  Use icy-hot on it as needed.  Your leg swelling: use your compression stockings and keep them elevated.  Your abdominal pain: Use metamucil or another fiber supplement along with your miralax twice a day and the senokot.  Please follow-up with Dr. Shan Levans in 2 months.  If you have any questions or concerns, please do not hesitate to call the office at [phone].  Best,   [place] Constable, DO

## 2018-12-29 NOTE — Assessment & Plan Note (Signed)
Suspect that this is also secondary to constipation.  Reassured as patient has had 2- CT scans with most recent being less than 6 months ago.  Patient is mildly tender, but no rebound noted on exam.  Reassured that she improves following bowel movements. -Continue MiraLAX twice daily -Continue Senokot daily - Add Metamucil or fiber supplementation to regimen -Return precautions given, follow-up if does not improve

## 2019-01-12 ENCOUNTER — Other Ambulatory Visit: Payer: Self-pay | Admitting: Family Medicine

## 2019-01-13 ENCOUNTER — Encounter (INDEPENDENT_AMBULATORY_CARE_PROVIDER_SITE_OTHER): Payer: Self-pay | Admitting: Orthopaedic Surgery

## 2019-01-13 ENCOUNTER — Ambulatory Visit (INDEPENDENT_AMBULATORY_CARE_PROVIDER_SITE_OTHER): Payer: Medicare Other | Admitting: Orthopaedic Surgery

## 2019-01-13 ENCOUNTER — Other Ambulatory Visit (INDEPENDENT_AMBULATORY_CARE_PROVIDER_SITE_OTHER): Payer: Self-pay | Admitting: *Deleted

## 2019-01-13 VITALS — BP 104/61 | HR 81 | Ht 59.0 in | Wt 125.0 lb

## 2019-01-13 DIAGNOSIS — M1711 Unilateral primary osteoarthritis, right knee: Secondary | ICD-10-CM

## 2019-01-13 DIAGNOSIS — M25561 Pain in right knee: Principal | ICD-10-CM

## 2019-01-13 DIAGNOSIS — G8929 Other chronic pain: Secondary | ICD-10-CM

## 2019-01-13 MED ORDER — METHYLPREDNISOLONE ACETATE 40 MG/ML IJ SUSP
80.0000 mg | INTRAMUSCULAR | Status: AC | PRN
  Administered 2019-01-13: 80 mg via INTRA_ARTICULAR

## 2019-01-13 MED ORDER — BUPIVACAINE HCL 0.5 % IJ SOLN
2.0000 mL | INTRAMUSCULAR | Status: AC | PRN
  Administered 2019-01-13: 2 mL via INTRA_ARTICULAR

## 2019-01-13 MED ORDER — LIDOCAINE HCL 1 % IJ SOLN
2.0000 mL | INTRAMUSCULAR | Status: AC | PRN
  Administered 2019-01-13: 2 mL

## 2019-01-13 NOTE — Progress Notes (Signed)
Office Visit Note   Patient: Heather [name]           Date of Birth: 1918/05/09           MRN: [id number] Visit Date: 01/13/2019              Requested by: [name], MD [address], [place] [address] PCP: [name], MD   Assessment & Plan: Visit Diagnoses:  1. Unilateral primary osteoarthritis, right knee     Plan: Advanced osteoarthritis right knee.  Will inject with cortisone and apply a supportive brace.  She does use a walker for balance.  We will plan to see her back as needed.  Follow-Up Instructions: Return if symptoms worsen or fail to improve.   Orders:  Orders Placed This Encounter  Procedures  . Large Joint Inj: R knee   No orders of the defined types were placed in this encounter.     Procedures: Large Joint Inj: R knee on 01/13/2019 4:25 PM Indications: pain and diagnostic evaluation Details: 25 G 1.5 in needle, anteromedial approach  Arthrogram: No  Medications: 2 mL lidocaine 1 %; 2 mL bupivacaine 0.5 %; 80 mg methylPREDNISolone acetate 40 MG/ML Procedure, treatment alternatives, risks and benefits explained, specific risks discussed. Consent was given by the patient. Immediately prior to procedure a time out was called to verify the correct patient, procedure, equipment, support staff and site/side marked as required. Patient was prepped and draped in the usual sterile fashion.       Clinical Data: No additional findings.   Subjective: Chief Complaint  Patient presents with  . Right Knee - Pain  Patient presents today with right knee pain. She was last here 8 months ago and received a cortisone injection in the right knee. Patient states that the injection has worn off. She is taking tylenol with codeine. Patient has noticed bilateral leg swelling as well.  Films have demonstrated advanced arthritis of her right knee.  She feels like her knee is "more painful" recently.  She does use a walker for ambulation related  to her balance.  She would like a brace and another shot of cortisone  HPI  Review of Systems   Objective: Vital Signs: BP 104/61   Pulse 81   Ht 4\' 11"  (1.499 m)   Wt 125 lb (56.7 kg)   BMI 25.25 kg/m   Physical Exam Constitutional:      Appearance: She is well-developed.  Eyes:     Pupils: Pupils are equal, round, and reactive to light.  Pulmonary:     Effort: Pulmonary effort is normal.  Skin:    General: Skin is warm and dry.  Neurological:     Mental Status: She is alert and oriented to person, place, and time.  Psychiatric:        Behavior: Behavior normal.     Ortho Exam wake alert and oriented x3.  Accompanied by family member.  Right knee had a small effusion.  She lacks just a few degrees to full extension and can flex over 105 degrees.  Some medial lateral joint pain and mild patellar crepitation.  No instability.  Does wear support stockings related to bilateral lower extremity edema.  Specialty Comments:  No specialty comments available.  Imaging: No results found.   PMFS History: Patient Active Problem List   Diagnosis Date Noted  . Unilateral primary osteoarthritis, right knee 01/13/2019  . Dysuria 11/19/2018  . Back pain 04/08/2018  .  Medication management 02/28/2018  . Sacral pain 02/07/2018  . Urinary incontinence without sensory awareness 12/02/2017  . Arthritis of left hip 10/20/2017  . Functional abdominal pain syndrome 09/02/2017  . Encounter for chronic pain management 07/22/2017  . Dementia ([place]) 06/25/2017  . Vitamin D deficiency 05/21/2017  . Abnormal CT scan of lung 07/16/2015  . Chronic LLQ pain 11/23/2014  . Esophageal dysmotility 10/27/2014  . Failure to thrive in adult 04/26/2014  . Arthritis of hand, degenerative 04/10/2014  . Other and unspecified hyperlipidemia 04/10/2014  . LPRD (laryngopharyngeal reflux disease) 04/04/2014  . Chronic bronchitis ([place]) 05/30/2013  . Hx of breast cancer 08/30/2012  . Edema 06/12/2011  .  GERD 03/27/2010  . Diastolic CHF ([place]) 16/08/9603  . Constipation 01/18/2009  . Carpal tunnel syndrome on both sides 10/03/2008  . Cervical spondylosis without myelopathy 03/21/2008  . Diaphragmatic hernia 12/26/2007  . Renal cyst 12/26/2007  . Primary hypertension 09/15/2007  . Venous (peripheral) insufficiency 09/15/2007  . Osteoarthritis 09/15/2007  . Lumbago 09/15/2007  . Diverticulosis of colon 05/24/2002   Past Medical History:  Diagnosis Date  . Allergic rhinitis, cause unspecified 02/21/2014  . Anxiety state, unspecified   . Breast cancer ([place])    NO STICK OR BLOOD PRESSURE CHECKS IN LEFT ARM  . Carpal tunnel syndrome   . Cervical spondylosis   . Colon polyp 12/19/2009   Transverse-polypoid colorectal mucosa  . Constipation   . Degenerative joint disease   . Diastolic dysfunction   . Diverticula of colon   . Gastritis, chronic   . Hiatal hernia   . Hypertension   . Low back pain   . RBBB (right bundle branch block with left anterior fascicular block)   . Renal cyst   . Venous insufficiency     Family History  Problem Relation Age of Onset  . Colon cancer Neg Hx     Past Surgical History:  Procedure Laterality Date  . CARPAL TUNNEL RELEASE    . Left mastectomy     Social History   Occupational History  . Occupation: Retired    Fish farm manager: RETIRED  Tobacco Use  . Smoking status: Never Smoker  . Smokeless tobacco: Never Used  Substance and Sexual Activity  . Alcohol use: No    Alcohol/week: 0.0 standard drinks  . Drug use: No  . Sexual activity: Not on file

## 2019-01-13 NOTE — Progress Notes (Unsigned)
[place]

## 2019-01-14 DIAGNOSIS — M1711 Unilateral primary osteoarthritis, right knee: Secondary | ICD-10-CM | POA: Diagnosis not present

## 2019-01-17 ENCOUNTER — Other Ambulatory Visit: Payer: Self-pay | Admitting: Family Medicine

## 2019-01-17 DIAGNOSIS — I872 Venous insufficiency (chronic) (peripheral): Secondary | ICD-10-CM

## 2019-01-17 DIAGNOSIS — I5033 Acute on chronic diastolic (congestive) heart failure: Secondary | ICD-10-CM

## 2019-02-22 ENCOUNTER — Telehealth: Payer: Self-pay

## 2019-02-22 ENCOUNTER — Other Ambulatory Visit: Payer: Self-pay

## 2019-02-22 ENCOUNTER — Telehealth (INDEPENDENT_AMBULATORY_CARE_PROVIDER_SITE_OTHER): Payer: Medicare Other | Admitting: Family Medicine

## 2019-02-22 DIAGNOSIS — R358 Other polyuria: Secondary | ICD-10-CM | POA: Diagnosis not present

## 2019-02-22 DIAGNOSIS — I5032 Chronic diastolic (congestive) heart failure: Secondary | ICD-10-CM

## 2019-02-22 DIAGNOSIS — R3589 Other polyuria: Secondary | ICD-10-CM | POA: Insufficient documentation

## 2019-02-22 NOTE — Assessment & Plan Note (Signed)
Given continued edema and what I believe is large volume drinking (she could not tell me how much was in one of her water bottles), will only treat with limiting po intake to three bottles of water per day.

## 2019-02-22 NOTE — Telephone Encounter (Signed)
Pt called nurse line to talk about her urinary issues. Pt states, "I go to the bathroom all of the time." Given patient is 85 years old, I set her up with a telemedicine visit for this morning.

## 2019-02-22 NOTE — Progress Notes (Signed)
It was challenging to communicate with the patient. She insisted that it is not 10:50 AM yet, which was her appointment time, even though I called her at 10:51 AM. She stated that she was expecting a female doctor to call her and that she could not hear me since she is not wearing her hearing aid. I told her that I will have a female doctor call her.  I spoke with and handed her over to Dr. Andria Frames. Note: I spent over 10 min trying to communicate with her.

## 2019-02-22 NOTE — Progress Notes (Signed)
Complaints of frequent large urination x 2 month  Called and she agrees to phone visit.    No family member present.  Lives alone and son checks on her daily. She is Hard of hearing but seemed to understand me.  The conversation meandered:  I believe her chief complaint was large frequent urination.    Ongoing problem.  Not currently on lasix.  Drinks four large bottles of water each day.  Helps her dry mouth.  Soaking through her depends.  States still has some leg swelling.  Also complained of left groin pain - known hip osteoarthritis.  Duration of phone call 25 minutes.

## 2019-02-22 NOTE — Assessment & Plan Note (Signed)
States cannot tolerate lasix.  Will thus limit PO intake.

## 2019-02-25 ENCOUNTER — Other Ambulatory Visit: Payer: Self-pay | Admitting: Family Medicine

## 2019-02-25 DIAGNOSIS — I5033 Acute on chronic diastolic (congestive) heart failure: Secondary | ICD-10-CM

## 2019-02-25 DIAGNOSIS — I872 Venous insufficiency (chronic) (peripheral): Secondary | ICD-10-CM

## 2019-02-28 ENCOUNTER — Other Ambulatory Visit: Payer: Self-pay

## 2019-02-28 DIAGNOSIS — I5033 Acute on chronic diastolic (congestive) heart failure: Secondary | ICD-10-CM

## 2019-02-28 DIAGNOSIS — I872 Venous insufficiency (chronic) (peripheral): Secondary | ICD-10-CM

## 2019-02-28 NOTE — Telephone Encounter (Signed)
Error

## 2019-03-03 ENCOUNTER — Other Ambulatory Visit: Payer: Self-pay

## 2019-03-03 ENCOUNTER — Encounter: Payer: Self-pay | Admitting: Family Medicine

## 2019-03-03 ENCOUNTER — Telehealth (INDEPENDENT_AMBULATORY_CARE_PROVIDER_SITE_OTHER): Payer: Medicare Other | Admitting: Family Medicine

## 2019-03-03 DIAGNOSIS — M1711 Unilateral primary osteoarthritis, right knee: Secondary | ICD-10-CM

## 2019-03-03 DIAGNOSIS — M545 Low back pain: Secondary | ICD-10-CM | POA: Diagnosis not present

## 2019-03-03 DIAGNOSIS — M170 Bilateral primary osteoarthritis of knee: Secondary | ICD-10-CM

## 2019-03-03 DIAGNOSIS — G8929 Other chronic pain: Secondary | ICD-10-CM | POA: Diagnosis not present

## 2019-03-03 DIAGNOSIS — G894 Chronic pain syndrome: Secondary | ICD-10-CM

## 2019-03-03 HISTORY — DX: Chronic pain syndrome: G89.4

## 2019-03-03 MED ORDER — MELOXICAM 7.5 MG PO TABS: 7.5000 mg | ORAL_TABLET | Freq: Every day | ORAL | 0 refills | Status: AC

## 2019-03-03 NOTE — Progress Notes (Signed)
[place] Telemedicine Visit  Patient consented to have visit conducted via telephone. Information for visit came from review of medical record and interview of patient.  History limited by patient's dementia.  Encounter participants: Patient: Heather [name]  Patient Location: Home Provider: Sherren Mocha Cheryal Salas  Others (if applicable):   Chief Complaint: back pain  HPI: Back pain, chronic Onset: several years ago Location: lower back, rlq, under ribs Quality: aching with occasional stabbing pains Severity: moderate, occasionally "doubles me over" Function: lives alone, still driving, performing all ADLs except bathing for which she has [facility] aide assist with bathing her back.   Pattern: constant Course: stable with occasional flares.  Currently feels like it has worsened again Radiation: no radiation below thighs Relief: Pt recalls receiving an "inflammatin" medication from DR Lenna Gilford about a year ago that "knowcked out" the pain.  She cannot recall what it was.  Current tking Tyl#2 without relief  Associated Symptoms:       Restricted ROM/stiffness/swelling:  Morning stiffness       Muscle ache/cramp/spasms: no       Color or temperature change: no       Muscle strength change: no  Trauma (Acute or Chronic): no recent falls or other impact injuries  Relevant PMH/PSH: chronic back pain, chronic functional abdominal pain3   ROS:  No fever No cough No SHOB   Exam:  Respiratory: speaking in full sentence, no audible wheeze, voice prosodic Easily distracted into non sequitors, but able to self-correct to regain discussion of main topic.   Assessment/Plan:  Osteoarthritis Established problem Uncontrolled pain interfering with ADL function by making them more difficult After search of [facility] record medication db and calling her [place], the only potential suspect medications were Naprosyn or Meloxicam.   Since eGFR is > 60, will try a 5 day  course of Meloxicam 7.5 mg daily.  Pt advised that should she feel increase in her chronic swelling, she should stop the medicaiton immediately and report it to our office.  She was able to repeat how to take the medications and reason to stop med and call us.      Time spent on phone with patient: 31 minutes

## 2019-03-03 NOTE — Assessment & Plan Note (Signed)
Established problem Uncontrolled pain interfering with ADL function by making them more difficult After search of [facility] record medication db and calling her [place], the only potential suspect medications were Naprosyn or Meloxicam.   Since eGFR is > 60, will try a 5 day course of Meloxicam 7.5 mg daily.  Pt advised that should she feel increase in her chronic swelling, she should stop the medicaiton immediately and report it to our office.  She was able to repeat how to take the medications and reason to stop med and call us.

## 2019-03-17 ENCOUNTER — Other Ambulatory Visit: Payer: Self-pay | Admitting: Family Medicine

## 2019-03-17 DIAGNOSIS — I5033 Acute on chronic diastolic (congestive) heart failure: Secondary | ICD-10-CM

## 2019-03-17 DIAGNOSIS — I872 Venous insufficiency (chronic) (peripheral): Secondary | ICD-10-CM

## 2019-03-29 ENCOUNTER — Encounter: Payer: Self-pay | Admitting: Family Medicine

## 2019-03-29 ENCOUNTER — Other Ambulatory Visit: Payer: Self-pay

## 2019-03-29 ENCOUNTER — Ambulatory Visit (INDEPENDENT_AMBULATORY_CARE_PROVIDER_SITE_OTHER): Payer: Medicare Other | Admitting: Family Medicine

## 2019-03-29 DIAGNOSIS — R600 Localized edema: Secondary | ICD-10-CM | POA: Diagnosis not present

## 2019-03-29 DIAGNOSIS — F0390 Unspecified dementia without behavioral disturbance: Secondary | ICD-10-CM | POA: Diagnosis not present

## 2019-03-29 DIAGNOSIS — R1032 Left lower quadrant pain: Secondary | ICD-10-CM

## 2019-03-29 DIAGNOSIS — G8929 Other chronic pain: Secondary | ICD-10-CM

## 2019-03-29 NOTE — Patient Instructions (Signed)
It was nice seeing you today, Heather [name]!  Below is a list of your medications, how you should be taking them, and what you take them for.  Furosemide: 1 tablet/day.  This is your water pill.  Alprazolam: 1 tablet as you need it up to twice daily for anxiety.  Omeprazole: 1 pill/day, before a meal.  This treats your acid reflux.  Carvedilol: 1 pill twice per day with meals.  This helps reduce your blood pressure and helps protect your heart  Spironolactone: 1 pill/day.  This medicine helps lower your blood pressure.  Please feel free to call our clinic or make an appointment if you would like to be seen.  I am always happy to see you.  Take care, Dr. Shan Levans

## 2019-03-29 NOTE — Progress Notes (Signed)
Subjective:    Heather [name] - 85 y.o. female MRN [id number]  Date of birth: 02-28-1918  CC:  Heather [name] is here to discuss her pedal edema, abdominal pain, and medications.  HPI: Pedal edema Patient reports that she continues to have pedal edema even though she continues to take Lasix 20 mg daily.  She has had trouble getting her TED hose to stay up because they are so tight.  Abdominal pain She says that she continues to have her chronic left-sided abdominal pain that radiates to her back.  It hurts all the time, and she does not know of any alleviating or exacerbating factors.  She does continue to take Tylenol 3 to alleviate this pain.  She has a normal bowel movement once or twice per day and eats plenty of fruits and vegetables and uses MiraLAX to keep her bowel movements soft and frequent.  Medications Patient continues to take her furosemide, carvedilol, spironolactone, omeprazole, and alprazolam as directed.  She has written down what each medication is for on the medication label, but she would like for me to write down a piece of paper that she can refer back to.  Health Maintenance:  Health Maintenance Due  Topic Date Due  . TETANUS/TDAP  06/13/1939  . PNA vac Low Risk Adult (1 of 2 - PCV13) 06/12/1985    -  reports that she has never smoked. She has never used smokeless tobacco. - Review of Systems: Per HPI. - Past Medical History: Patient Active Problem List   Diagnosis Date Noted  . Chronic pain syndrome 03/03/2019  . Polyuria 02/22/2019  . Unilateral primary osteoarthritis, right knee 01/13/2019  . Dysuria 11/19/2018  . Back pain 04/08/2018  . Medication management 02/28/2018  . Sacral pain 02/07/2018  . Urinary incontinence without sensory awareness 12/02/2017  . Arthritis of left hip 10/20/2017  . Functional abdominal pain syndrome 09/02/2017  . Encounter for chronic pain management 07/22/2017  . Dementia ([place]) 06/25/2017  . Vitamin D  deficiency 05/21/2017  . Abnormal CT scan of lung 07/16/2015  . Chronic LLQ pain 11/23/2014  . Esophageal dysmotility 10/27/2014  . Failure to thrive in adult 04/26/2014  . Arthritis of hand, degenerative 04/10/2014  . Other and unspecified hyperlipidemia 04/10/2014  . LPRD (laryngopharyngeal reflux disease) 04/04/2014  . Chronic bronchitis ([place]) 05/30/2013  . Hx of breast cancer 08/30/2012  . Edema 06/12/2011  . GERD 03/27/2010  . Diastolic CHF ([place]) 65/01/5464  . Constipation 01/18/2009  . Carpal tunnel syndrome on both sides 10/03/2008  . Cervical spondylosis without myelopathy 03/21/2008  . Diaphragmatic hernia 12/26/2007  . Renal cyst 12/26/2007  . Primary hypertension 09/15/2007  . Venous (peripheral) insufficiency 09/15/2007  . Osteoarthritis 09/15/2007  . Lumbago 09/15/2007  . Diverticulosis of colon 05/24/2002   - Medications: reviewed and updated   Objective:   Physical Exam BP (!) 118/52   Pulse 76   SpO2 95%  Gen: NAD, alert, cooperative with exam, well-appearing for her age 29: NCAT, clear conjunctiva, supple neck CV: RRR, good S1/S2, no murmur, 2+ pitting edema to knees bilaterally, right greater than left Resp: CTABL, no wheezes, non-labored Abd: No tenderness to palpation of left side of abdomen, BS present, no guarding or organomegaly Skin: no rashes, normal turgor  Neuro/psych: Mild neurocognitive decline, somewhat tangential and perseverating thought process, no gross deficits apparent        Assessment & Plan:   Edema Chronic, encouraged patient to obtain more comfortable TED hose that will  stay up, since these seem to work well for her.  Encouraged her to walk and to keep her legs elevated when she is seated.  Patient expressed understanding and will try to get better fitting TED hose.  We will not change any of her medications at this time.  Chronic LLQ pain This is a recurring, chronic complaint that has been worked up several times in the  past with no explanation.  This is likely due to osteoarthritis of her left hip, and I have explained this to the patient.  Patient has asked for a nephrology referral, but I told her that her kidneys work remarkably well for her age, and that she does not need to see an additional doctor for this.  I have ensured that this is not due to constipation and she continues to eat well and function well.  I provide reassurance at every visit for this complaint.  She will benefit from continued reassurance and minimizing any unnecessary work-up.  Dementia Patient does remarkably well for age, but she does perseverate on the same medical issues and gets confused easily.  Her medications were reviewed with her and each medication was written down in her AVS alongside what condition it is for.    Maia Breslow, M.D. 03/30/2019, 1:56 PM PGY-2, [place]

## 2019-03-30 ENCOUNTER — Other Ambulatory Visit: Payer: Self-pay | Admitting: Family Medicine

## 2019-03-30 DIAGNOSIS — F411 Generalized anxiety disorder: Secondary | ICD-10-CM

## 2019-03-30 NOTE — Assessment & Plan Note (Signed)
Patient does remarkably well for age, but she does perseverate on the same medical issues and gets confused easily.  Her medications were reviewed with her and each medication was written down in her AVS alongside what condition it is for.

## 2019-03-30 NOTE — Assessment & Plan Note (Signed)
Chronic, encouraged patient to obtain more comfortable TED hose that will stay up, since these seem to work well for her.  Encouraged her to walk and to keep her legs elevated when she is seated.  Patient expressed understanding and will try to get better fitting TED hose.  We will not change any of her medications at this time.

## 2019-03-30 NOTE — Assessment & Plan Note (Signed)
This is a recurring, chronic complaint that has been worked up several times in the past with no explanation.  This is likely due to osteoarthritis of her left hip, and I have explained this to the patient.  Patient has asked for a nephrology referral, but I told her that her kidneys work remarkably well for her age, and that she does not need to see an additional doctor for this.  I have ensured that this is not due to constipation and she continues to eat well and function well.  I provide reassurance at every visit for this complaint.  She will benefit from continued reassurance and minimizing any unnecessary work-up.

## 2019-04-12 ENCOUNTER — Telehealth: Payer: Self-pay | Admitting: Family Medicine

## 2019-04-12 NOTE — Telephone Encounter (Signed)
Rn from [facility] called to see if Dr. Shan Levans had discussed placing a nephrologist referral for pt.   Pt was checking to see if it was completed but looks like there was never one placed.   Please call pt to discuss.

## 2019-04-13 NOTE — Telephone Encounter (Signed)
Is there a call back number for patient's nurse?  I will be happy to speak with her about this.  I discussed a nephrology referral with Ms. Stockham at her last visit and told her that it is not indicated since she has normal kidney function and does not need to complicate her care further since she is 85 years old.

## 2019-04-15 ENCOUNTER — Other Ambulatory Visit: Payer: Self-pay | Admitting: Family Medicine

## 2019-04-25 ENCOUNTER — Telehealth: Payer: Self-pay | Admitting: Family Medicine

## 2019-04-25 NOTE — Telephone Encounter (Signed)
Veronica from [facility] called regarding physical at [facility] requesting an referral for home health for pt. Please give Verdene Lennert a call back.

## 2019-04-26 NOTE — Telephone Encounter (Signed)
Reviewed chart and did not see any number for this person at [place].  Will wait until Rn calls back to inform her of below if she calls back. Donnelle Olmeda Zimmerman Rumple, CMA

## 2019-05-02 NOTE — Telephone Encounter (Signed)
Veronica RN from [facility] is calling back again and would like to know if an order can be placed so the pt may receive home health care.   Verdene Lennert would like for a nurse or Dr. Shan Levans to call her on her work cell [phone].

## 2019-05-03 ENCOUNTER — Other Ambulatory Visit: Payer: Self-pay | Admitting: Family Medicine

## 2019-05-03 DIAGNOSIS — F0390 Unspecified dementia without behavioral disturbance: Secondary | ICD-10-CM

## 2019-05-03 NOTE — Telephone Encounter (Signed)
Spoke with Verdene Lennert, who suggested that patient might benefit from home health nursing and home health aide.  I agreed with Verdene Lennert that this would be a great idea, and I hold her that I will put the order in after our conversation.  I have placed the order for ambulatory referral for home health requesting an RN and a home health aide.

## 2019-05-09 DIAGNOSIS — G894 Chronic pain syndrome: Secondary | ICD-10-CM | POA: Diagnosis not present

## 2019-05-09 DIAGNOSIS — I872 Venous insufficiency (chronic) (peripheral): Secondary | ICD-10-CM | POA: Diagnosis not present

## 2019-05-09 DIAGNOSIS — M1612 Unilateral primary osteoarthritis, left hip: Secondary | ICD-10-CM | POA: Diagnosis not present

## 2019-05-09 DIAGNOSIS — I5030 Unspecified diastolic (congestive) heart failure: Secondary | ICD-10-CM | POA: Diagnosis not present

## 2019-05-09 DIAGNOSIS — M47812 Spondylosis without myelopathy or radiculopathy, cervical region: Secondary | ICD-10-CM | POA: Diagnosis not present

## 2019-05-09 DIAGNOSIS — M1712 Unilateral primary osteoarthritis, left knee: Secondary | ICD-10-CM | POA: Diagnosis not present

## 2019-05-09 DIAGNOSIS — F0390 Unspecified dementia without behavioral disturbance: Secondary | ICD-10-CM | POA: Diagnosis not present

## 2019-05-09 DIAGNOSIS — I110 Hypertensive heart disease with heart failure: Secondary | ICD-10-CM | POA: Diagnosis not present

## 2019-05-25 ENCOUNTER — Telehealth: Payer: Self-pay | Admitting: *Deleted

## 2019-05-25 NOTE — Telephone Encounter (Signed)
Heather [name] request Verbal orders for PT to come out and eval / treat.  You can leave a message on confidential VM. Heather [name], CMA

## 2019-05-26 NOTE — Telephone Encounter (Signed)
Verbal orders sent.  Thanks! 

## 2019-06-03 ENCOUNTER — Other Ambulatory Visit: Payer: Self-pay

## 2019-06-03 ENCOUNTER — Telehealth (INDEPENDENT_AMBULATORY_CARE_PROVIDER_SITE_OTHER): Payer: Medicare Other | Admitting: Family Medicine

## 2019-06-03 ENCOUNTER — Telehealth: Payer: Self-pay | Admitting: *Deleted

## 2019-06-03 DIAGNOSIS — R109 Unspecified abdominal pain: Secondary | ICD-10-CM | POA: Diagnosis not present

## 2019-06-03 MED ORDER — BACLOFEN 10 MG PO TABS: 10.0000 mg | ORAL_TABLET | Freq: Two times a day (BID) | ORAL | 3 refills | Status: DC | PRN

## 2019-06-03 NOTE — Progress Notes (Signed)
[place] Telemedicine Visit  Patient consented to have virtual visit. Method of visit: Telephone  Encounter participants: Patient: Heather [name] - located at home Provider: [name] - located at [facility] Others (if applicable): none  Chief Complaint: abdominal pain  HPI:  Patient reports that she continues to get crampy left-sided abdominal pain.  This is a longstanding problem for her, and she has gotten multiple CT scans for this in the past.  She says that she no longer takes Tylenol with codeine, but she does take extra strength Tylenol for her pain, which does not seem to improve it much.  She is having normal bowel movements and is eating well.  She denies fevers or other constitutional symptoms.  She feels like her side is "drawn up" due to this pain and that her skin is loose because of this as well.  She is being seen with home health physical therapy, which she says is helpful to keep her active.  ROS: per HPI  Pertinent PMHx: Chronic functional abdominal pain, neurocognitive decline, CHF  Exam:  Respiratory: Speaks in complete sentences without shortness of breath, no cough  Assessment/Plan:  Functional abdominal pain syndrome Since previous CT scans have not found any abnormality and patient discusses this pain with me at almost every visit, I feel that this is likely due to her hip osteoarthritis.  I encouraged her to continue to take Tylenol if this is helpful for her, and I offered her baclofen, since this is a safer muscle relaxer in the elderly.  We will start with baclofen 10 mg twice daily as needed.  Patient expressed understanding with this plan.    Time spent during visit with patient: 12 minutes

## 2019-06-03 NOTE — Assessment & Plan Note (Signed)
Since previous CT scans have not found any abnormality and patient discusses this pain with me at almost every visit, I feel that this is likely due to her hip osteoarthritis.  I encouraged her to continue to take Tylenol if this is helpful for her, and I offered her baclofen, since this is a safer muscle relaxer in the elderly.  We will start with baclofen 10 mg twice daily as needed.  Patient expressed understanding with this plan.

## 2019-06-03 NOTE — Telephone Encounter (Signed)
Son called and states his mom would like for [place] to call her back.  Attemtped to call x 2.  Busy signal.  Will awiat callback. Christen Bame, CMA

## 2019-06-08 DIAGNOSIS — I5030 Unspecified diastolic (congestive) heart failure: Secondary | ICD-10-CM | POA: Diagnosis not present

## 2019-06-08 DIAGNOSIS — I110 Hypertensive heart disease with heart failure: Secondary | ICD-10-CM | POA: Diagnosis not present

## 2019-06-08 DIAGNOSIS — M1612 Unilateral primary osteoarthritis, left hip: Secondary | ICD-10-CM | POA: Diagnosis not present

## 2019-06-08 DIAGNOSIS — M1712 Unilateral primary osteoarthritis, left knee: Secondary | ICD-10-CM | POA: Diagnosis not present

## 2019-06-08 DIAGNOSIS — G894 Chronic pain syndrome: Secondary | ICD-10-CM | POA: Diagnosis not present

## 2019-06-08 DIAGNOSIS — F0390 Unspecified dementia without behavioral disturbance: Secondary | ICD-10-CM | POA: Diagnosis not present

## 2019-06-08 DIAGNOSIS — M47812 Spondylosis without myelopathy or radiculopathy, cervical region: Secondary | ICD-10-CM | POA: Diagnosis not present

## 2019-06-08 DIAGNOSIS — I872 Venous insufficiency (chronic) (peripheral): Secondary | ICD-10-CM | POA: Diagnosis not present

## 2019-06-10 ENCOUNTER — Telehealth: Payer: Self-pay | Admitting: *Deleted

## 2019-06-10 NOTE — Telephone Encounter (Signed)
Verbal orders given.  Thanks.

## 2019-06-10 NOTE — Telephone Encounter (Signed)
Shareese from [employer] for PT verbal orders as follows:  1 time(s) weekly for 1 week(s), then 2 time(s) weekly for 4 week(s)  You can leave verbal orders on confidential voicemail.  Christen Bame, CMA

## 2019-06-11 ENCOUNTER — Other Ambulatory Visit: Payer: Self-pay | Admitting: Family Medicine

## 2019-06-11 DIAGNOSIS — I5033 Acute on chronic diastolic (congestive) heart failure: Secondary | ICD-10-CM

## 2019-06-11 DIAGNOSIS — I872 Venous insufficiency (chronic) (peripheral): Secondary | ICD-10-CM

## 2019-06-11 DIAGNOSIS — F411 Generalized anxiety disorder: Secondary | ICD-10-CM

## 2019-06-29 ENCOUNTER — Ambulatory Visit (INDEPENDENT_AMBULATORY_CARE_PROVIDER_SITE_OTHER): Payer: Medicare Other | Admitting: Family Medicine

## 2019-06-29 ENCOUNTER — Encounter: Payer: Self-pay | Admitting: Family Medicine

## 2019-06-29 ENCOUNTER — Other Ambulatory Visit: Payer: Self-pay

## 2019-06-29 ENCOUNTER — Telehealth: Payer: Self-pay | Admitting: Orthopaedic Surgery

## 2019-06-29 DIAGNOSIS — H9193 Unspecified hearing loss, bilateral: Secondary | ICD-10-CM | POA: Diagnosis not present

## 2019-06-29 DIAGNOSIS — R059 Cough, unspecified: Secondary | ICD-10-CM

## 2019-06-29 DIAGNOSIS — R05 Cough: Secondary | ICD-10-CM | POA: Diagnosis not present

## 2019-06-29 DIAGNOSIS — R1032 Left lower quadrant pain: Secondary | ICD-10-CM | POA: Diagnosis not present

## 2019-06-29 DIAGNOSIS — R600 Localized edema: Secondary | ICD-10-CM

## 2019-06-29 DIAGNOSIS — G8929 Other chronic pain: Secondary | ICD-10-CM

## 2019-06-29 MED ORDER — ALBUTEROL SULFATE HFA 108 (90 BASE) MCG/ACT IN AERS: 1.0000 | INHALATION_SPRAY | RESPIRATORY_TRACT | 0 refills | Status: DC | PRN

## 2019-06-29 NOTE — Telephone Encounter (Signed)
Patient's son [name] called stating Heather [name] has back and left hip pain.  [name] is requesting a return call to let him know "if Dr. Durward Fortes would be willing to give her an injection considering her age."

## 2019-06-29 NOTE — Patient Instructions (Addendum)
It was nice seeing you today Ms. Gregory!  I have sent in an inhaler to help with your cough.  Please asked the pharmacist to go over how to use this medicine.  If this cough continues to be a problem, please let me know.  I am also ordering a test to check for colon cancer, which you can do at home.  The company that does this test will contact you further about this.  Please talk to Dr. Durward Fortes to see if he would be able to give you corticosteroid shots in your back to help relieve your pain.  Please buy a new pair of compression stockings and keep your feet elevated as much as you can at home to reduce your swelling.  If you have any questions or concerns, please feel free to call the clinic.   Be well,  Dr. Shan Levans

## 2019-06-29 NOTE — Progress Notes (Signed)
Subjective:    Heather [name] - 85 y.o. female MRN [id number]  Date of birth: May 14, 1918  CC:  Heather [name] is here for follow up of leg edema, melena, and frequent cough.  HPI: Leg edema This is a chronic problem for the patient.  She says that she continues to take Lasix 20 mg once daily and spironolactone 25 mg once daily but does not notice much of a difference.  She wears her compression stockings daily and tries to keep her feet elevated as much as she can.  She denies skin breakdown.  Melena She had one episode of loose stools and thinks that she saw some black material in her stool.  She says that otherwise her bowel movements have been normal.  She continues to complain of chronic left sided abdominal pain, which she has been worked up for multiple times in the past.  Her son, who accompanies her, reports that patient has voiced to him that she is concerned that she may have colon cancer.  Frequent cough Patient reports that she coughs up phlegm most days.  She denies shortness of breath or chest pain.  She denies fever.  Health Maintenance:  Health Maintenance Due  Topic Date Due  . TETANUS/TDAP  06/13/1939  . PNA vac Low Risk Adult (1 of 2 - PCV13) 06/12/1985  . INFLUENZA VACCINE  06/18/2019    -  reports that she has never smoked. She has never used smokeless tobacco. - Review of Systems: Per HPI. - Past Medical History: Patient Active Problem List   Diagnosis Date Noted  . Hearing decreased, bilateral 06/30/2019  . Chronic pain syndrome 03/03/2019  . Polyuria 02/22/2019  . Unilateral primary osteoarthritis, right knee 01/13/2019  . Dysuria 11/19/2018  . Back pain 04/08/2018  . Medication management 02/28/2018  . Sacral pain 02/07/2018  . Urinary incontinence without sensory awareness 12/02/2017  . Arthritis of left hip 10/20/2017  . Functional abdominal pain syndrome 09/02/2017  . Encounter for chronic pain management 07/22/2017  . Dementia ([place])  06/25/2017  . Vitamin D deficiency 05/21/2017  . Abnormal CT scan of lung 07/16/2015  . Chronic LLQ pain 11/23/2014  . Esophageal dysmotility 10/27/2014  . Cough 10/21/2014  . Arthritis of hand, degenerative 04/10/2014  . Other and unspecified hyperlipidemia 04/10/2014  . LPRD (laryngopharyngeal reflux disease) 04/04/2014  . Chronic bronchitis ([place]) 05/30/2013  . Hx of breast cancer 08/30/2012  . Edema 06/12/2011  . GERD 03/27/2010  . Diastolic CHF ([place]) 10/62/6948  . Constipation 01/18/2009  . Carpal tunnel syndrome on both sides 10/03/2008  . Cervical spondylosis without myelopathy 03/21/2008  . Diaphragmatic hernia 12/26/2007  . Renal cyst 12/26/2007  . Primary hypertension 09/15/2007  . Venous (peripheral) insufficiency 09/15/2007  . Osteoarthritis 09/15/2007  . Lumbago 09/15/2007  . Diverticulosis of colon 05/24/2002   - Medications: reviewed and updated   Objective:   Physical Exam BP (!) 112/52   Pulse 78   Wt 126 lb 9.6 oz (57.4 kg)   SpO2 97%   BMI 25.57 kg/m  Gen: NAD, alert, cooperative with exam, appears younger than stated age, hard of hearing CV: RRR, good S1/S2, no murmur, 1-2+ pitting pedal edema bilaterally Resp: CTABL, no wheezes, non-labored Abd: Tender to palpation on left lower side of abdomen, BS present, no guarding or organomegaly Psych: Perseverates and is somewhat tangential, although is alert and oriented        Assessment & Plan:   Chronic LLQ pain Will obtain Cologuard screening  for patient since she is concerned about colon cancer.  If this did return positive, we would need to have a conversation about goals of care, since patient is 85 years old.  Edema Chronic and stable.  Encouraged patient to buy new compression stockings and keep her feet elevated.  We have tried to adjust her diuretic dose in the past, but she has thought that this has worsened her edema, so we will continue her current diuretics.  Cough Cough does not seem  infectious.  Will prescribe albuterol as needed to see if this will help patient's cough.  Hearing decreased, bilateral Discussed with the son the importance of obtaining hearing aids for Ms. Bumgardner in order to improve her quality of life and help with her ability to understand others.  He is currently working on this.    Maia Breslow, M.D. 06/30/2019, 8:08 AM PGY-3, [place]

## 2019-06-30 DIAGNOSIS — H9193 Unspecified hearing loss, bilateral: Secondary | ICD-10-CM | POA: Insufficient documentation

## 2019-06-30 NOTE — Assessment & Plan Note (Signed)
Chronic and stable.  Encouraged patient to buy new compression stockings and keep her feet elevated.  We have tried to adjust her diuretic dose in the past, but she has thought that this has worsened her edema, so we will continue her current diuretics.

## 2019-06-30 NOTE — Telephone Encounter (Signed)
Needs to be reevaluated before making a decision about an injection

## 2019-06-30 NOTE — Assessment & Plan Note (Signed)
Will obtain Cologuard screening for patient since she is concerned about colon cancer.  If this did return positive, we would need to have a conversation about goals of care, since patient is 85 years old.

## 2019-06-30 NOTE — Telephone Encounter (Signed)
Spoke with patient's son. He is going to schedule an appointment for evaluation with Dr.Whitfield.

## 2019-06-30 NOTE — Telephone Encounter (Signed)
Please advise 

## 2019-06-30 NOTE — Assessment & Plan Note (Signed)
Cough does not seem infectious.  Will prescribe albuterol as needed to see if this will help patient's cough.

## 2019-06-30 NOTE — Assessment & Plan Note (Signed)
Discussed with the son the importance of obtaining hearing aids for Heather [name] in order to improve her quality of life and help with her ability to understand others.  He is currently working on this.

## 2019-07-01 ENCOUNTER — Other Ambulatory Visit: Payer: Self-pay | Admitting: Family Medicine

## 2019-07-01 DIAGNOSIS — K921 Melena: Secondary | ICD-10-CM

## 2019-07-01 DIAGNOSIS — Z1211 Encounter for screening for malignant neoplasm of colon: Secondary | ICD-10-CM

## 2019-07-06 ENCOUNTER — Ambulatory Visit: Payer: Self-pay

## 2019-07-06 ENCOUNTER — Other Ambulatory Visit: Payer: Self-pay

## 2019-07-06 ENCOUNTER — Encounter: Payer: Self-pay | Admitting: Orthopaedic Surgery

## 2019-07-06 ENCOUNTER — Ambulatory Visit (INDEPENDENT_AMBULATORY_CARE_PROVIDER_SITE_OTHER): Payer: Medicare Other | Admitting: Orthopaedic Surgery

## 2019-07-06 VITALS — BP 116/58 | HR 78 | Ht 59.0 in | Wt 120.0 lb

## 2019-07-06 DIAGNOSIS — M545 Low back pain, unspecified: Secondary | ICD-10-CM

## 2019-07-06 DIAGNOSIS — G8929 Other chronic pain: Secondary | ICD-10-CM | POA: Diagnosis not present

## 2019-07-06 NOTE — Progress Notes (Addendum)
Office Visit Note   Patient: Heather [name]           Date of Birth: [birth year]           MRN: [id number] Visit Date: 07/06/2019              Requested by: [name], MD [address],  [place] [address] PCP: [name], MD   Assessment & Plan: Visit Diagnoses:  1. Chronic left-sided low back pain, unspecified whether sciatica present     Plan: Not sure there is any significant change in her films from today compared to the back films from 2019.  I do not see an obvious compression fracture.  I suspect her pain is related to the diffuse degenerative arthritis of the lumbar spine and areas of stenosis.  These were confirmed in the MRI scan of 2012.  I really hesitant to prescribe any medicine given her age and all of her other comorbidities.  Long discussion with Heather [name] and the family member who accompanies her.  We will try a lumbar support.  May continue with the Tylenol.  Office visit over 30 minutes 50% of the time in counseling.  Also discussed repeat MRI scan and even possibly epidural steroid injections.  I did not want to proceed with that today. Lumbar support did help  and made a "big difference"  Follow-Up Instructions: Return if symptoms worsen or fail to improve.   Orders:  Orders Placed This Encounter  Procedures  . XR Lumbar Spine 2-3 Views   No orders of the defined types were placed in this encounter.     Procedures: No procedures performed   Clinical Data: No additional findings.   Subjective: Chief Complaint  Patient presents with  . Lower Back - Pain  Patient present today for lower back pain. She said that it has been hurting for 5-15months. She said that the pain starts in her abdomen and radiates into her lower back. The pain is on the left side. She said that she has some pain in her left leg. Patient states that both legs are swollen, but the left side is worse. She is taking tylenol arthritis for pain.  Films of  the lumbar spine were performed about a year and a half ago demonstrating diffuse areas of degenerative arthritis.  MRI scan of lumbar spine was performed in 2012 confirming diffuse degenerative arthritis and levels of spinal stenosis from L2-3 through L5-S1. Heather [name] has had recurrent episodes of back pain with referred pain to 1 or both lower extremities.  She is a poor historian and is not sure if this just represents new or old pain.  She has taken Tylenol for discomfort but she is also on Lasix for lower extremity swelling as well as an antihypertensive.  She uses a walker to assist with her ambulation   HPI  Review of Systems   Objective: Vital Signs: BP (!) 116/58   Pulse 78   Ht 4\' 11"  (1.499 m)   Wt 120 lb (54.4 kg)   BMI 24.24 kg/m   Physical Exam Constitutional:      Appearance: She is well-developed.  Eyes:     Pupils: Pupils are equal, round, and reactive to light.  Pulmonary:     Effort: Pulmonary effort is normal.  Skin:    General: Skin is warm and dry.  Neurological:     Mental Status: She is alert and oriented to person, place, and time.  Psychiatric:        Behavior: Behavior normal.     Ortho Exam awake alert and oriented x3.  Little hard of hearing.  Ambulates with a walker independently. painless range of motion both hips.  Does have degenerative changes in both of her knees as previously outlined.  Straight leg raise negative.  Bilateral lower extremity edema.  Some areas of percussible tenderness about the lumbar spine  Specialty Comments:  No specialty comments available.  Imaging: No results found.   PMFS History: Patient Active Problem List   Diagnosis Date Noted  . Hearing decreased, bilateral 06/30/2019  . Chronic pain syndrome 03/03/2019  . Polyuria 02/22/2019  . Unilateral primary osteoarthritis, right knee 01/13/2019  . Dysuria 11/19/2018  . Back pain 04/08/2018  . Medication management 02/28/2018  . Sacral pain 02/07/2018  .  Urinary incontinence without sensory awareness 12/02/2017  . Arthritis of left hip 10/20/2017  . Functional abdominal pain syndrome 09/02/2017  . Encounter for chronic pain management 07/22/2017  . Dementia ([place]) 06/25/2017  . Vitamin D deficiency 05/21/2017  . Abnormal CT scan of lung 07/16/2015  . Chronic LLQ pain 11/23/2014  . Esophageal dysmotility 10/27/2014  . Cough 10/21/2014  . Arthritis of hand, degenerative 04/10/2014  . Other and unspecified hyperlipidemia 04/10/2014  . LPRD (laryngopharyngeal reflux disease) 04/04/2014  . Chronic bronchitis ([place]) 05/30/2013  . Hx of breast cancer 08/30/2012  . Edema 06/12/2011  . GERD 03/27/2010  . Diastolic CHF ([place]) 56/21/3086  . Constipation 01/18/2009  . Carpal tunnel syndrome on both sides 10/03/2008  . Cervical spondylosis without myelopathy 03/21/2008  . Diaphragmatic hernia 12/26/2007  . Renal cyst 12/26/2007  . Primary hypertension 09/15/2007  . Venous (peripheral) insufficiency 09/15/2007  . Osteoarthritis 09/15/2007  . Lumbago 09/15/2007  . Diverticulosis of colon 05/24/2002   Past Medical History:  Diagnosis Date  . Allergic rhinitis, cause unspecified 02/21/2014  . Anxiety state, unspecified   . Breast cancer ([place])    NO STICK OR BLOOD PRESSURE CHECKS IN LEFT ARM  . Carpal tunnel syndrome   . Cervical spondylosis   . Chronic pain syndrome 03/03/2019  . Colon polyp 12/19/2009   Transverse-polypoid colorectal mucosa  . Constipation   . Degenerative joint disease   . Diastolic dysfunction   . Diverticula of colon   . Gastritis, chronic   . Hiatal hernia   . Hypertension   . Low back pain   . RBBB (right bundle branch block with left anterior fascicular block)   . Renal cyst   . Venous insufficiency     Family History  Problem Relation Age of Onset  . Colon cancer Neg Hx     Past Surgical History:  Procedure Laterality Date  . CARPAL TUNNEL RELEASE    . Left mastectomy     Social History   Occupational  History  . Occupation: Retired    Fish farm manager: RETIRED  Tobacco Use  . Smoking status: Never Smoker  . Smokeless tobacco: Never Used  Substance and Sexual Activity  . Alcohol use: No    Alcohol/week: 0.0 standard drinks  . Drug use: No  . Sexual activity: Not on file

## 2019-07-08 ENCOUNTER — Telehealth: Payer: Self-pay

## 2019-07-08 NOTE — Telephone Encounter (Signed)
Heather [name], home health PT, called nurse line requesting verbal orders for home health PT.  2x a week for 3 weeks  1x a week for 2 weeks   Please call verbals into [place] [phone]

## 2019-07-11 NOTE — Telephone Encounter (Signed)
Verbal orders given.  Thanks! 

## 2019-07-13 ENCOUNTER — Telehealth: Payer: Self-pay | Admitting: Family Medicine

## 2019-07-13 NOTE — Telephone Encounter (Signed)
Lennette Bihari from [facility] is calling requesting new orders be faxed for the patient to have home health physical therapy.  The best fax number is [phone].

## 2019-07-15 DIAGNOSIS — G894 Chronic pain syndrome: Secondary | ICD-10-CM | POA: Diagnosis not present

## 2019-07-15 DIAGNOSIS — F0390 Unspecified dementia without behavioral disturbance: Secondary | ICD-10-CM | POA: Diagnosis not present

## 2019-07-15 DIAGNOSIS — M47812 Spondylosis without myelopathy or radiculopathy, cervical region: Secondary | ICD-10-CM | POA: Diagnosis not present

## 2019-07-15 DIAGNOSIS — M1712 Unilateral primary osteoarthritis, left knee: Secondary | ICD-10-CM | POA: Diagnosis not present

## 2019-07-15 DIAGNOSIS — I110 Hypertensive heart disease with heart failure: Secondary | ICD-10-CM | POA: Diagnosis not present

## 2019-07-15 DIAGNOSIS — M1612 Unilateral primary osteoarthritis, left hip: Secondary | ICD-10-CM | POA: Diagnosis not present

## 2019-07-15 DIAGNOSIS — I5030 Unspecified diastolic (congestive) heart failure: Secondary | ICD-10-CM | POA: Diagnosis not present

## 2019-07-15 DIAGNOSIS — I872 Venous insufficiency (chronic) (peripheral): Secondary | ICD-10-CM | POA: Diagnosis not present

## 2019-07-18 ENCOUNTER — Other Ambulatory Visit: Payer: Self-pay | Admitting: Family Medicine

## 2019-07-18 DIAGNOSIS — I5033 Acute on chronic diastolic (congestive) heart failure: Secondary | ICD-10-CM

## 2019-07-18 DIAGNOSIS — I872 Venous insufficiency (chronic) (peripheral): Secondary | ICD-10-CM

## 2019-07-20 NOTE — Telephone Encounter (Signed)
Home health orders were faxed.  Thanks.

## 2019-07-26 ENCOUNTER — Telehealth: Payer: Self-pay

## 2019-07-26 NOTE — Telephone Encounter (Signed)
Patient calls nurse line stating she though PCP was going to send her something so she could check for blood in her stool? I didn't see anything in her chart. Will forward to pcp.

## 2019-07-27 NOTE — Telephone Encounter (Signed)
I ordered a cologuard for her on 8/14 (listed under orders only) and messaged Robert to start the process.  I will check with him to see what the status of this is.  Thanks.

## 2019-07-28 ENCOUNTER — Other Ambulatory Visit: Payer: Self-pay | Admitting: Family Medicine

## 2019-07-28 DIAGNOSIS — R1032 Left lower quadrant pain: Secondary | ICD-10-CM

## 2019-07-28 DIAGNOSIS — G8929 Other chronic pain: Secondary | ICD-10-CM

## 2019-07-28 NOTE — Telephone Encounter (Signed)
I thought I ordered this but I didn't see it in the web portal, it is there now. I still need a future order place in epic. Thanks!

## 2019-07-28 NOTE — Telephone Encounter (Signed)
Will forward to robert so we can make sure this order was sent.  Jazmin Hartsell,CMA

## 2019-07-28 NOTE — Telephone Encounter (Signed)
Cologuard reordered as future order.  Thanks.

## 2019-08-01 DIAGNOSIS — Z961 Presence of intraocular lens: Secondary | ICD-10-CM | POA: Diagnosis not present

## 2019-08-01 DIAGNOSIS — H02054 Trichiasis without entropian left upper eyelid: Secondary | ICD-10-CM | POA: Diagnosis not present

## 2019-08-01 DIAGNOSIS — H0102B Squamous blepharitis left eye, upper and lower eyelids: Secondary | ICD-10-CM | POA: Diagnosis not present

## 2019-08-01 DIAGNOSIS — H0102A Squamous blepharitis right eye, upper and lower eyelids: Secondary | ICD-10-CM | POA: Diagnosis not present

## 2019-08-03 ENCOUNTER — Telehealth: Payer: Self-pay | Admitting: *Deleted

## 2019-08-03 NOTE — Telephone Encounter (Signed)
Son lm on nurse line asking for [place] to give mom a call.  Attempted to call, no answer and no machine. Christen Bame, CMA

## 2019-08-04 ENCOUNTER — Encounter: Payer: Self-pay | Admitting: Family Medicine

## 2019-08-04 ENCOUNTER — Other Ambulatory Visit: Payer: Self-pay

## 2019-08-04 ENCOUNTER — Ambulatory Visit: Payer: Medicare Other | Admitting: Family Medicine

## 2019-08-04 VITALS — BP 156/72 | HR 82

## 2019-08-04 DIAGNOSIS — R300 Dysuria: Secondary | ICD-10-CM

## 2019-08-04 LAB — POCT URINALYSIS DIP (MANUAL ENTRY)
Bilirubin, UA: NEGATIVE
Glucose, UA: NEGATIVE mg/dL
Ketones, POC UA: NEGATIVE mg/dL
Leukocytes, UA: NEGATIVE
Nitrite, UA: NEGATIVE
Protein Ur, POC: NEGATIVE mg/dL
Spec Grav, UA: 1.015 (ref 1.010–1.025)
Urobilinogen, UA: 0.2 E.U./dL
pH, UA: 6.5 (ref 5.0–8.0)

## 2019-08-04 MED ORDER — CEPHALEXIN 500 MG PO CAPS: 500.0000 mg | ORAL_CAPSULE | Freq: Three times a day (TID) | ORAL | 0 refills | Status: DC

## 2019-08-04 NOTE — Assessment & Plan Note (Addendum)
UTI is likely given dysuria with trace hematuria on UA and pt's history of UTIs. Nephrolithiasis was also considered however patient denies flank pain and appears comfortable on exam making this diagnosis less likely. Pt will be treated empirically with Keflex 500 mg, 3 times daily for 7 days as previous UTIs responded to this agent. Urine culture was ordered and results will be reviewed to determine if different medication is more suitable. Pt counseled on how to take medication and instructions were included on AVS.

## 2019-08-04 NOTE — Progress Notes (Signed)
   Subjective:  Heather [name] is a 85 y.o. female who presents to the [facility] today with a chief complaint of pain with urination.   HPI:  Dysuria:  Patient complains of burning with urination. She has had symptoms for 3 days. Patient denies cough, fever and vaginal discharge. Patient does have a history of recurrent UTI.  Patient does not have a history of pyelonephritis.    Review of Symptoms - see HPI   Objective:  Physical Exam: BP (!) 156/72   Pulse 82   SpO2 96%    Gen: NAD, resting comfortably CV: RRR with no murmurs appreciated. 2+ peripheral edema, bilaterally. Pulm: CTAB with no crackles, wheezes, or rhonchi  Office Visit on 08/04/2019  Component Date Value Ref Range Status  . Color, UA 08/04/2019 yellow  yellow Final  . Clarity, UA 08/04/2019 clear  clear Final  . Glucose, UA 08/04/2019 negative  negative mg/dL Final  . Bilirubin, UA 08/04/2019 negative  negative Final  . Ketones, POC UA 08/04/2019 negative  negative mg/dL Final  . Spec Grav, UA 08/04/2019 1.015  1.010 - 1.025 Final  . Blood, UA 08/04/2019 trace-intact* negative Final  . pH, UA 08/04/2019 6.5  5.0 - 8.0 Final  . Protein Ur, POC 08/04/2019 negative  negative mg/dL Final  . Urobilinogen, UA 08/04/2019 0.2  0.2 or 1.0 E.U./dL Final  . Nitrite, UA 08/04/2019 Negative  Negative Final  . Leukocytes, UA 08/04/2019 Negative  Negative Final    Assessment/Plan:   Problem List Items Addressed This Visit      Other   Dysuria - Primary    UTI is likely given dysuria with trace hematuria on UA and pt's history of UTIs. Nephrolithiasis was also considered however patient denies flank pain and appears comfortable on exam making this diagnosis less likely. Pt will be treated empirically with Keflex 500 mg, 3 times daily for 7 days as previous UTIs responded to this agent. Urine culture was ordered and results will be reviewed to determine if different medication is more suitable. Pt counseled on how to take  medication and instructions were included on AVS.       Relevant Orders   POCT urinalysis dipstick (Completed)   Urine Culture

## 2019-08-04 NOTE — Patient Instructions (Signed)
It was great meeting you today!  Your urine has some trace red blood cells.  This coupled with your symptoms I think was is that you have a urinary tract infection.  The confirmatory test will be the urine culture.  I will treat you with Keflex 500 mg 3 times daily for 5 days.  This should help with the burning.  He can also pick up Azo product over-the-counter help with the burning as well.  In regards to your inguinal pain you can try topical Voltaren gel.  They have this at home and is worth a shot.  I will get in contact with Dr. Shan Levans about your omeprazole being every other day or or perhaps continuing every day.  In regards to your Lasix I think it also be appropriate for her to manage that this is a more chronic issue.  I do not think it is harmful.  Continue taking a multivitamin with vitamin D and your vitamin D tablets.  Oftentimes elderly women will be profoundly lacking vitamin D think it is okay to supplement with both.

## 2019-08-05 ENCOUNTER — Telehealth: Payer: Self-pay | Admitting: *Deleted

## 2019-08-05 NOTE — Telephone Encounter (Signed)
Contacted patient to instruct on strategy to to cut capsules open and mix content with applesauce (she already has supply).  She plans two doses today and then will plan three times daily until gone.    She appreciated the call.

## 2019-08-05 NOTE — Telephone Encounter (Signed)
Pt states that the keflex is too big for her to swallow.  Are there other options or a different way to take the medication?  To Dr. Kris Mouton. Christen Bame, CMA

## 2019-08-06 LAB — URINE CULTURE

## 2019-08-08 ENCOUNTER — Encounter: Payer: Self-pay | Admitting: Family Medicine

## 2019-08-09 DIAGNOSIS — N3281 Overactive bladder: Secondary | ICD-10-CM | POA: Diagnosis not present

## 2019-08-10 ENCOUNTER — Telehealth: Payer: Self-pay

## 2019-08-10 NOTE — Telephone Encounter (Signed)
Lianne Bushy, PT, calls nurse line requesting verbal orders for extended PT starting next week.   1x a week for 3 weeks.  [phone] you may leave a VM

## 2019-08-11 ENCOUNTER — Telehealth (INDEPENDENT_AMBULATORY_CARE_PROVIDER_SITE_OTHER): Payer: Medicare Other | Admitting: Family Medicine

## 2019-08-11 ENCOUNTER — Other Ambulatory Visit: Payer: Self-pay

## 2019-08-11 DIAGNOSIS — R300 Dysuria: Secondary | ICD-10-CM

## 2019-08-11 NOTE — Progress Notes (Signed)
Virtual Visit via Telephone Note  I connected with Heather [name] on 08/11/19 at  8:50 AM EDT by telephone and verified that I am speaking with the correct person using two identifiers.  Location: Patient: home Provider: [facility] clinic   Patient is extremely hard of hearing and was very very difficult to get information to her, she largely narrated her own story line herself without any input from me.  I did call her son who is been her primary caretaker and is on her contact list and discussed the information with him and got word from him that she has been feeling better as well.  I gave them return precautions and the son is coming back to the house to inform his mother on the phone because she is not able to communicate well given her hearing   History of Present Illness: Dysuria is clearing, urine change in color is resolving, patient is able to eat well.  Did have some abdominal pain earlier in the course which has been improved by trying to take the keflex with apple sauce or pudding.   Observations/Objective: Speaking in full sentences in no distress, patient is hard of hearing so conversation was difficult.    Assessment and Plan: Dysuria/change in urine color, culture negative: advised to stop taking keflex  Reach back out to [place] as needed  Follow Up Instructions:    I discussed the assessment and treatment plan with the patient. The patient was provided an opportunity to ask questions and all were answered. The patient agreed with the plan and demonstrated an understanding of the instructions.   The patient was advised to call back or seek an in-person evaluation if the symptoms worsen or if the condition fails to improve as anticipated.  I provided 15 minutes of non-face-to-face time during this encounter.   Sherene Sires, DO

## 2019-08-11 NOTE — Telephone Encounter (Signed)
Verbal orders sent.  Thanks! 

## 2019-08-16 DIAGNOSIS — Z1231 Encounter for screening mammogram for malignant neoplasm of breast: Secondary | ICD-10-CM | POA: Diagnosis not present

## 2019-08-19 ENCOUNTER — Other Ambulatory Visit: Payer: Self-pay | Admitting: Family Medicine

## 2019-08-19 DIAGNOSIS — I872 Venous insufficiency (chronic) (peripheral): Secondary | ICD-10-CM

## 2019-08-19 DIAGNOSIS — F411 Generalized anxiety disorder: Secondary | ICD-10-CM

## 2019-08-19 DIAGNOSIS — I5033 Acute on chronic diastolic (congestive) heart failure: Secondary | ICD-10-CM

## 2019-09-28 ENCOUNTER — Ambulatory Visit (INDEPENDENT_AMBULATORY_CARE_PROVIDER_SITE_OTHER): Payer: Medicare Other | Admitting: Family Medicine

## 2019-09-28 ENCOUNTER — Other Ambulatory Visit: Payer: Self-pay

## 2019-09-28 VITALS — BP 110/56 | HR 80 | Wt 118.0 lb

## 2019-09-28 DIAGNOSIS — G8929 Other chronic pain: Secondary | ICD-10-CM

## 2019-09-28 DIAGNOSIS — R1032 Left lower quadrant pain: Secondary | ICD-10-CM

## 2019-09-28 NOTE — Patient Instructions (Signed)
Thank you for coming to see me today. It was a pleasure. Today we talked about:   Follow up with your urologist and Dr. Durward Fortes about your pain.  If you have any questions or concerns, please do not hesitate to call the office at [phone].  Best,   [place] Constable, DO

## 2019-09-28 NOTE — Assessment & Plan Note (Signed)
Vital signs are reassuring today and patient does remain tender in the left lower quadrant, but has not had changes in her pain.  This is a very chronic issue for her.  She has been seeing multiple specialists for the same.  Advised to continue to follow-up with specialist including urology, which her family member that accompanies her states she has an appointment with tomorrow.  Also has an appointment on 11/17 with orthopedics, did explain that their note stated that there could possibly consider another MRI or epidural steroid injections.  Advised follow-up with Dr. Shan Levans as needed.

## 2019-09-28 NOTE — Progress Notes (Signed)
     Subjective: Chief Complaint  Patient presents with  . Abdominal Pain     HPI: Heather [name] is a 85 y.o. presenting to clinic today to discuss the following:  1 Chronic LLQ Pain Patient has a history of chronic left lower quadrant abdominal pain for which she has been seen by Dr. Shan Levans as well as multiple specialists including urology and orthopedics.  It appears that her last orthopedic visit, on 07/06/2019, that a lumbar support was trialed.  Per patient and her family member who is with her, this did not help because it caused pressure on her abdomen.  I did discuss possibly repeating an MRI scan and even possibly epidural steroid injections.  Patient has not had any changes in her abdominal pain, but comes in today because she continues to have it.  She has not had any recent fevers.  Continues to state that the pain is only in the left lower quadrant and radiates down to the left side of her groin.  She notes that she has been peeing a normal amount and "pees a lot," but this has not changed.  She is also had numerous imaging of her abdomen and pelvis, most recently in September 2019.       ROS noted in HPI. Chief complaint noted.  Other Pertinent PMH: Chronic left lower quadrant abdominal pain, hypertension, HFpEF, GERD Past Medical, Surgical, Social, and Family History Reviewed & Updated per EMR.      Social History   Tobacco Use  Smoking Status Never Smoker  Smokeless Tobacco Never Used   Smoking status noted.    Objective: BP (!) 110/56   Pulse 80   Wt 118 lb (53.5 kg)   SpO2 96%   BMI 23.83 kg/m  Vitals and nursing notes reviewed  Physical Exam:  General: 85 y.o. female in NAD Lungs: CTAB, no wheezing, no rhonchi, no crackles, no IWOB on RA Abdomen: Soft, TTP LLQ, no overlying erythema, non-distended, positive bowel sounds Skin: warm and dry Extremities: ambulating with walker   No results found for this or any previous visit (from the past 72  hour(s)).  Assessment/Plan:  Chronic LLQ pain Vital signs are reassuring today and patient does remain tender in the left lower quadrant, but has not had changes in her pain.  This is a very chronic issue for her.  She has been seeing multiple specialists for the same.  Advised to continue to follow-up with specialist including urology, which her family member that accompanies her states she has an appointment with tomorrow.  Also has an appointment on 11/17 with orthopedics, did explain that their note stated that there could possibly consider another MRI or epidural steroid injections.  Advised follow-up with Dr. Shan Levans as needed.     PATIENT EDUCATION PROVIDED: See AVS    Diagnosis and plan along with any newly prescribed medication(s) were discussed in detail with this patient today. The patient verbalized understanding and agreed with the plan. Patient advised if symptoms worsen return to clinic or ER.   Health Maintainance:   No orders of the defined types were placed in this encounter.   No orders of the defined types were placed in this encounter.    [place] Constable, DO 09/28/2019, 5:04 PM PGY-2 [place]

## 2019-09-29 DIAGNOSIS — M545 Low back pain: Secondary | ICD-10-CM | POA: Diagnosis not present

## 2019-09-29 DIAGNOSIS — N3281 Overactive bladder: Secondary | ICD-10-CM | POA: Diagnosis not present

## 2019-10-03 ENCOUNTER — Telehealth: Payer: Self-pay | Admitting: *Deleted

## 2019-10-03 NOTE — Telephone Encounter (Signed)
Spoke with son regarding patient's need for a flu shot and he said that he will check with her once she wakes up and give [place] a call back to schedule this.  Jazmin Hartsell,CMA

## 2019-10-04 ENCOUNTER — Other Ambulatory Visit: Payer: Self-pay

## 2019-10-04 ENCOUNTER — Ambulatory Visit: Payer: Self-pay

## 2019-10-04 ENCOUNTER — Encounter: Payer: Self-pay | Admitting: Orthopaedic Surgery

## 2019-10-04 ENCOUNTER — Ambulatory Visit: Payer: Medicare Other | Admitting: Orthopedic Surgery

## 2019-10-04 ENCOUNTER — Ambulatory Visit (INDEPENDENT_AMBULATORY_CARE_PROVIDER_SITE_OTHER): Payer: Medicare Other | Admitting: Orthopaedic Surgery

## 2019-10-04 VITALS — BP 120/64 | HR 72 | Ht 59.0 in | Wt 118.0 lb

## 2019-10-04 DIAGNOSIS — M160 Bilateral primary osteoarthritis of hip: Secondary | ICD-10-CM

## 2019-10-04 DIAGNOSIS — M5136 Other intervertebral disc degeneration, lumbar region: Secondary | ICD-10-CM | POA: Insufficient documentation

## 2019-10-04 DIAGNOSIS — M25552 Pain in left hip: Secondary | ICD-10-CM

## 2019-10-04 DIAGNOSIS — M461 Sacroiliitis, not elsewhere classified: Secondary | ICD-10-CM | POA: Diagnosis not present

## 2019-10-04 DIAGNOSIS — M1612 Unilateral primary osteoarthritis, left hip: Secondary | ICD-10-CM | POA: Diagnosis not present

## 2019-10-04 NOTE — Progress Notes (Signed)
Office Visit Note   Patient: Heather [name]           Date of Birth: [birth year]           MRN: [id number] Visit Date: 10/04/2019              Requested by: [name], MD [address],  [place] [address] PCP: [name], MD   Assessment & Plan: Visit Diagnoses:  1. Pain in left hip   2. Primary osteoarthritis of both hips   3. Arthritis of left hip   4. SI (sacroiliac) joint inflammation (HCC)   5. DDD (degenerative disc disease), lumbar     Plan:  #1: At this time because she has minimal pain if any palpation or any pain with range of motion of the hip we are going to stay with conservative treatment.  She is able to get up and down out of the chair without difficulty during this visit. #2: We feel that most of her symptoms also are considered from her lumbar spine which has marked degenerative changes.  However it 85 years of age she is certainly not an operative candidate but certainly we could do MRI scan in the future if necessary for corticosteroid injections.  Follow-Up Instructions: Return if symptoms worsen or fail to improve.   Orders:  Orders Placed This Encounter  Procedures  . XR HIP UNILAT W OR W/O PELVIS 2-3 VIEWS LEFT   No orders of the defined types were placed in this encounter.     Procedures: No procedures performed   Clinical Data: No additional findings.   Subjective: Chief Complaint  Patient presents with  . Left Hip - Pain  Patient presents today with left side lower back pain. She said that it hurts along the left side. She said that it hurts if she is weightbearing. She is taking tylenol for pain. She said that it has been hurting for a year. She was last evaluated and x-rayed in August of 2020 Today she complains of pain in the pelvis buttocks thigh.  However she sits comfortably in the chair discussing this.  Apparently she 2 weeks ago was holding onto the doorknob and started to fall she states that she held  onto the doorknob and slowly left down to the floor.  She at that time had some pain in the buttock area more on the left than on the right.  She denies any bowel or bladder loss of function.  She has function as she has had over the past several months.Marland Kitchen   HPI  Review of Systems  Constitutional: Negative for fatigue.  HENT: Negative for ear pain.   Eyes: Positive for pain.  Respiratory: Negative for shortness of breath.   Cardiovascular: Positive for leg swelling.  Gastrointestinal: Positive for constipation. Negative for diarrhea.  Endocrine: Negative for cold intolerance and heat intolerance.  Genitourinary: Negative for difficulty urinating.  Musculoskeletal: Negative for joint swelling.  Skin: Negative for rash.  Allergic/Immunologic: Negative for food allergies.  Neurological: Positive for weakness.  Hematological: Does not bruise/bleed easily.  Psychiatric/Behavioral: Negative for sleep disturbance.     Objective: Vital Signs: BP 120/64   Pulse 72   Ht 4\' 11"  (1.499 m)   Wt 118 lb (53.5 kg)   BMI 23.83 kg/m   Physical Exam Constitutional:      Appearance: She is well-developed.  Eyes:     Pupils: Pupils are equal, round, and reactive to light.  Pulmonary:  Effort: Pulmonary effort is normal.  Skin:    General: Skin is warm and dry.  Neurological:     Mental Status: She is alert and oriented to person, place, and time.  Psychiatric:        Behavior: Behavior normal.     Ortho Exam  Exam today reveals a limited internal and external rotation of the left hip but does not have any pain.  I can flex her to about 115 degrees.  She can go into full extension.  Negative straight leg raising bilaterally.  She has some tenderness over the pubic rami more on the left than on the right.  Some pain over the SI joint to palpation.  Her sensation is intact to light touch.  Specialty Comments:  No specialty comments available.  Imaging: Xr Hip Unilat W Or W/o Pelvis 2-3  Views Left  Result Date: 10/04/2019 Three-view x-ray of the pelvis and left hip reveals some cystic changes and degenerative changes in the symphysis pubis.  She has currently some bone-on-bone arthritis of the left hip.  Large cystic changes in the head noted.  Do not see a fracture nor does Dr. Durward Fortes.  She does have SI degenerative changes noted.  Increased bowel gas    PMFS History: Current Outpatient Medications  Medication Sig Dispense Refill  . albuterol (VENTOLIN HFA) 108 (90 Base) MCG/ACT inhaler Inhale 1-2 puffs into the lungs every 4 (four) hours as needed for wheezing or shortness of breath. 18 g 0  . ALPRAZolam (XANAX) 0.25 MG tablet Take 1 tablet by mouth twice daily as needed for anxiety 30 tablet 0  . baclofen (LIORESAL) 10 MG tablet Take 1 tablet (10 mg total) by mouth 2 (two) times daily as needed for muscle spasms. 30 each 3  . carvedilol (COREG) 3.125 MG tablet TAKE 1 TABLET BY MOUTH TWICE DAILY 180 tablet 3  . cephALEXin (KEFLEX) 500 MG capsule Take 1 capsule (500 mg total) by mouth 3 (three) times daily. 15 capsule 0  . cholecalciferol (VITAMIN D) 1000 units tablet Take 1,000 Units by mouth daily.    . feeding supplement, ENSURE ENLIVE, (ENSURE ENLIVE) LIQD Take 237 mLs by mouth 2 (two) times daily between meals. 237 mL 12  . furosemide (LASIX) 20 MG tablet TAKE 1 TABLET BY MOUTH ONCE DAILY . APPOINTMENT REQUIRED FOR FUTURE REFILLS 30 tablet 0  . levocetirizine (XYZAL) 5 MG tablet Take 5 mg by mouth every evening.  2  . Multiple Vitamins-Minerals (MULTIVITAMINS THER. W/MINERALS) TABS Take 1 tablet by mouth daily.    Marland Kitchen omeprazole (PRILOSEC) 20 MG capsule Take 1 capsule (20 mg total) by mouth daily. 30 capsule 11  . polyethylene glycol powder (GLYCOLAX/MIRALAX) powder Take 17 g by mouth daily as needed. 3350 g 3  . senna (SENOKOT) 8.6 MG TABS tablet Take 1 tablet (8.6 mg total) by mouth daily. 120 each 0  . spironolactone (ALDACTONE) 25 MG tablet Take 1 tablet by mouth  once daily 90 tablet 0   No current facility-administered medications for this visit.     Patient Active Problem List   Diagnosis Date Noted  . SI (sacroiliac) joint inflammation ([place]) 10/04/2019  . DDD (degenerative disc disease), lumbar 10/04/2019  . Hearing decreased, bilateral 06/30/2019  . Chronic pain syndrome 03/03/2019  . Polyuria 02/22/2019  . Unilateral primary osteoarthritis, right knee 01/13/2019  . Dysuria 11/19/2018  . Back pain 04/08/2018  . Medication management 02/28/2018  . Sacral pain 02/07/2018  . Urinary incontinence without sensory  awareness 12/02/2017  . Arthritis of left hip 10/20/2017  . Functional abdominal pain syndrome 09/02/2017  . Encounter for chronic pain management 07/22/2017  . Dementia ([place]) 06/25/2017  . Vitamin D deficiency 05/21/2017  . Abnormal CT scan of lung 07/16/2015  . Chronic LLQ pain 11/23/2014  . Esophageal dysmotility 10/27/2014  . Cough 10/21/2014  . Arthritis of hand, degenerative 04/10/2014  . Other and unspecified hyperlipidemia 04/10/2014  . LPRD (laryngopharyngeal reflux disease) 04/04/2014  . Chronic bronchitis ([place]) 05/30/2013  . Hx of breast cancer 08/30/2012  . Edema 06/12/2011  . GERD 03/27/2010  . Diastolic CHF ([facility]) [id number]  . Constipation 01/18/2009  . Carpal tunnel syndrome on both sides 10/03/2008  . Cervical spondylosis without myelopathy 03/21/2008  . Diaphragmatic hernia 12/26/2007  . Renal cyst 12/26/2007  . Primary hypertension 09/15/2007  . Venous (peripheral) insufficiency 09/15/2007  . Osteoarthritis 09/15/2007  . Lumbago 09/15/2007  . Diverticulosis of colon 05/24/2002   Past Medical History:  Diagnosis Date  . Allergic rhinitis, cause unspecified 02/21/2014  . Anxiety state, unspecified   . Breast cancer ([place])    NO STICK OR BLOOD PRESSURE CHECKS IN LEFT ARM  . Carpal tunnel syndrome   . Cervical spondylosis   . Chronic pain syndrome 03/03/2019  . Colon polyp 12/19/2009    Transverse-polypoid colorectal mucosa  . Constipation   . Degenerative joint disease   . Diastolic dysfunction   . Diverticula of colon   . Gastritis, chronic   . Hiatal hernia   . Hypertension   . Low back pain   . RBBB (right bundle branch block with left anterior fascicular block)   . Renal cyst   . Venous insufficiency     Family History  Problem Relation Age of Onset  . Colon cancer Neg Hx     Past Surgical History:  Procedure Laterality Date  . CARPAL TUNNEL RELEASE    . Left mastectomy     Social History   Occupational History  . Occupation: Retired    Fish farm manager: RETIRED  Tobacco Use  . Smoking status: Never Smoker  . Smokeless tobacco: Never Used  Substance and Sexual Activity  . Alcohol use: No    Alcohol/week: 0.0 standard drinks  . Drug use: No  . Sexual activity: Not on file

## 2019-10-19 ENCOUNTER — Telehealth: Payer: Self-pay | Admitting: *Deleted

## 2019-10-19 NOTE — Telephone Encounter (Signed)
Son would like the MD to call him to discuss a referral to GI for her stomach pain. Christen Bame, CMA

## 2019-10-21 ENCOUNTER — Telehealth: Payer: Self-pay | Admitting: Family Medicine

## 2019-10-21 ENCOUNTER — Other Ambulatory Visit: Payer: Self-pay | Admitting: Family Medicine

## 2019-10-21 DIAGNOSIS — I872 Venous insufficiency (chronic) (peripheral): Secondary | ICD-10-CM

## 2019-10-21 DIAGNOSIS — I5033 Acute on chronic diastolic (congestive) heart failure: Secondary | ICD-10-CM

## 2019-10-21 DIAGNOSIS — H9193 Unspecified hearing loss, bilateral: Secondary | ICD-10-CM

## 2019-10-21 DIAGNOSIS — R109 Unspecified abdominal pain: Secondary | ICD-10-CM

## 2019-10-21 MED ORDER — FUROSEMIDE 20 MG PO TABS: ORAL_TABLET | ORAL | 0 refills | Status: DC

## 2019-10-21 MED ORDER — OMEPRAZOLE 40 MG PO CPDR: 40.0000 mg | DELAYED_RELEASE_CAPSULE | Freq: Every day | ORAL | 11 refills | Status: DC

## 2019-10-21 NOTE — Telephone Encounter (Signed)
Spoke with Ms. Walthers's son regarding his request for a GI referral.  He says that the home health nurse suggested it due to Ms. Mccain's chronic stomach pain.  I told him that it would be ideal for her to not be in any pain, but due to her advanced age and neurocognitive decline, we should try to keep interventions as minimal as possible.  I would be happy to refer her to GI, but they may not want to do much given her normal CT abdomen/pelvis and her frailty.  I will be happy to increase her PPI dose in the meantime.  I also think she would benefit from visiting our geriatrics clinic, and he is amenable to this.  I spoke with Ms. Plate as well, who says she needs a refill on her furosemide.  I will be happy to do this for her.  I also recommended that she get new compression stockings to help with her pedal edema.  She says that her hearing difficulty has worsened, and I agreed with her that she would benefit greatly from a hearing aid.  I will see if I can refer her to audiology for an assessment.  She is also interested in Meals on Wheels since [facility] suggested this, and I think this is a great idea.  I will look into this for her as well.

## 2019-10-21 NOTE — Telephone Encounter (Signed)
Left voicemail at [facility] to inquire about getting patient set up for Meals on Wheels.  Asked them to call our clinic with information regarding how to start this process.

## 2019-10-27 ENCOUNTER — Ambulatory Visit: Payer: Medicare Other | Admitting: Gastroenterology

## 2019-10-27 ENCOUNTER — Other Ambulatory Visit: Payer: Self-pay | Admitting: Family Medicine

## 2019-10-27 ENCOUNTER — Encounter: Payer: Self-pay | Admitting: Gastroenterology

## 2019-10-27 ENCOUNTER — Other Ambulatory Visit (INDEPENDENT_AMBULATORY_CARE_PROVIDER_SITE_OTHER): Payer: Medicare Other

## 2019-10-27 ENCOUNTER — Telehealth: Payer: Self-pay | Admitting: Family Medicine

## 2019-10-27 VITALS — BP 104/60 | HR 82 | Temp 96.9°F | Ht 59.0 in | Wt 119.4 lb

## 2019-10-27 DIAGNOSIS — R1032 Left lower quadrant pain: Secondary | ICD-10-CM

## 2019-10-27 DIAGNOSIS — F411 Generalized anxiety disorder: Secondary | ICD-10-CM

## 2019-10-27 LAB — BASIC METABOLIC PANEL
BUN: 30 mg/dL — ABNORMAL HIGH (ref 6–23)
CO2: 29 mEq/L (ref 19–32)
Calcium: 9.3 mg/dL (ref 8.4–10.5)
Chloride: 101 mEq/L (ref 96–112)
Creatinine, Ser: 1.09 mg/dL (ref 0.40–1.20)
GFR: 55.92 mL/min — ABNORMAL LOW (ref >60.00)
Glucose, Bld: 91 mg/dL (ref 70–99)
Potassium: 4.2 mEq/L (ref 3.5–5.1)
Sodium: 139 mEq/L (ref 135–145)

## 2019-10-27 LAB — CBC WITH DIFFERENTIAL/PLATELET
Basophils Absolute: 0 10*3/uL (ref 0.0–0.1)
Basophils Relative: 0.8 % (ref 0.0–3.0)
Eosinophils Absolute: 0.1 10*3/uL (ref 0.0–0.7)
Eosinophils Relative: 1 % (ref 0.0–5.0)
HCT: 40.6 % (ref 36.0–46.0)
Hemoglobin: 13.3 g/dL (ref 12.0–15.0)
Lymphocytes Relative: 33 % (ref 12.0–46.0)
Lymphs Abs: 1.8 10*3/uL (ref 0.7–4.0)
MCHC: 32.8 g/dL (ref 30.0–36.0)
MCV: 95.3 fl (ref 78.0–100.0)
Monocytes Absolute: 0.7 10*3/uL (ref 0.1–1.0)
Monocytes Relative: 13.3 % — ABNORMAL HIGH (ref 3.0–12.0)
Neutro Abs: 2.8 10*3/uL (ref 1.4–7.7)
Neutrophils Relative %: 51.9 % (ref 43.0–77.0)
Platelets: 205 10*3/uL (ref 150.0–400.0)
RBC: 4.26 Mil/uL (ref 3.87–5.11)
RDW: 14 % (ref 11.5–15.5)
WBC: 5.3 10*3/uL (ref 4.0–10.5)

## 2019-10-27 NOTE — Patient Instructions (Addendum)
If you are age 83 or older, your body mass index should be between 23-30. Your Body mass index is 24.11 kg/m. If this is out of the aforementioned range listed, please consider follow up with your Primary Care Provider.  If you are age 14 or younger, your body mass index should be between 19-25. Your Body mass index is 24.11 kg/m. If this is out of the aformentioned range listed, please consider follow up with your Primary Care Provider.   Your provider has requested that you go to the basement level for lab work before leaving today. Press "B" on the elevator. The lab is located at the first door on the left as you exit the elevator.  You have been scheduled for a CT scan of the abdomen and pelvis at [facility] ([address]---this is in the same building as [employer]).   You are scheduled on Monday 11/07/19 at 9:30 am. You should arrive 15 minutes prior to your appointment time for registration. Please follow the written instructions below on the day of your exam:  WARNING: IF YOU ARE ALLERGIC TO IODINE/X-RAY DYE, PLEASE NOTIFY RADIOLOGY IMMEDIATELY AT [phone]! YOU WILL BE GIVEN A 13 HOUR PREMEDICATION PREP.  1) Do not eat or drink anything after 5:30 am (4 hours prior to your test) 2) You have been given 2 bottles of oral contrast to drink. The solution may taste better if refrigerated, but do NOT add ice or any other liquid to this solution. Shake well before drinking.    Drink 1 bottle of contrast @ 7:30 am (2 hours prior to your exam)  Drink 1 bottle of contrast @ 8:30 am (1 hour prior to your exam)  You may take any medications as prescribed with a small amount of water, if necessary. If you take any of the following medications: METFORMIN, GLUCOPHAGE, GLUCOVANCE, AVANDAMET, RIOMET, FORTAMET, [place] MET, JANUMET, GLUMETZA or METAGLIP, you MAY be asked to HOLD this medication 48 hours AFTER the exam.  The purpose of you drinking the oral contrast is to  aid in the visualization of your intestinal tract. The contrast solution may cause some diarrhea. Depending on your individual set of symptoms, you may also receive an intravenous injection of x-ray contrast/dye. Plan on being at [facility] for 30 minutes or longer, depending on the type of exam you are having performed.  This test typically takes 30-45 minutes to complete.  If you have any questions regarding your exam or if you need to reschedule, you may call the CT department at [phone] between the hours of 8:00 am and 5:00 pm, Monday-Friday.

## 2019-10-27 NOTE — Progress Notes (Signed)
10/27/2019 [name] [id number] 27-Mar-1918   HISTORY OF PRESENT ILLNESS: This is a 85 year old female who is here today with her son for evaluation of left lower quadrant abdominal pain.  After reviewing her chart it appears that she is had complains of left lower quadrant abdominal pain dating back to at least January 2016 at which time she was seen by one of our nurse practitioners here for that complaint.  She has had multiple CT scans performed since that time with no cause of her pain identified.  The 2 most recent are from 2019, both showing prominent/moderate stool throughout the colon indicative of constipation.  She tells me that she takes MiraLAX daily and moves her bowels fairly regularly.  She says that the pain is almost intolerable at times and takes her breath.  She constantly has some degree of pain in her left lower quadrant.  Her appetite is fairly good.  Her son says that she has lost weight over the years, but overall has been stable recently.   Past Medical History:  Diagnosis Date  . Allergic rhinitis, cause unspecified 02/21/2014  . Anxiety state, unspecified   . Breast cancer ([place])    NO STICK OR BLOOD PRESSURE CHECKS IN LEFT ARM  . Carpal tunnel syndrome   . Cervical spondylosis   . Chronic pain syndrome 03/03/2019  . Colon polyp 12/19/2009   Transverse-polypoid colorectal mucosa  . Constipation   . Degenerative joint disease   . Diastolic dysfunction   . Diverticula of colon   . Gastritis, chronic   . Hiatal hernia   . Hypertension   . Low back pain   . RBBB (right bundle branch block with left anterior fascicular block)   . Renal cyst   . Venous insufficiency    Past Surgical History:  Procedure Laterality Date  . CARPAL TUNNEL RELEASE    . Left mastectomy      reports that she has never smoked. She has never used smokeless tobacco. She reports that she does not drink alcohol or use drugs. family history is not on file. Allergies  Allergen  Reactions  . Fentanyl Nausea Only and Other (See Comments)    Pt states that she is not allergic  Nausea only.   . Naprosyn [Naproxen] Other (See Comments)    Patient felt tongue and face swelling. Went to ED where no visible swelling noted      Outpatient Encounter Medications as of 10/27/2019  Medication Sig  . ALPRAZolam (XANAX) 0.25 MG tablet Take 1 tablet by mouth twice daily as needed for anxiety  . baclofen (LIORESAL) 10 MG tablet Take 1 tablet (10 mg total) by mouth 2 (two) times daily as needed for muscle spasms.  . carvedilol (COREG) 3.125 MG tablet TAKE 1 TABLET BY MOUTH TWICE DAILY  . cholecalciferol (VITAMIN D) 1000 units tablet Take 1,000 Units by mouth daily.  . feeding supplement, ENSURE ENLIVE, (ENSURE ENLIVE) LIQD Take 237 mLs by mouth 2 (two) times daily between meals.  . furosemide (LASIX) 20 MG tablet Please take one tablet daily as needed for leg swelling.  . levocetirizine (XYZAL) 5 MG tablet Take 5 mg by mouth as needed.   . Multiple Vitamins-Minerals (MULTIVITAMINS THER. W/MINERALS) TABS Take 1 tablet by mouth daily.  Marland Kitchen omeprazole (PRILOSEC) 40 MG capsule Take 1 capsule (40 mg total) by mouth daily.  . polyethylene glycol powder (GLYCOLAX/MIRALAX) powder Take 17 g by mouth daily as needed.  Marland Kitchen spironolactone (ALDACTONE) 25  MG tablet Take 1 tablet by mouth once daily  . [DISCONTINUED] albuterol (VENTOLIN HFA) 108 (90 Base) MCG/ACT inhaler Inhale 1-2 puffs into the lungs every 4 (four) hours as needed for wheezing or shortness of breath. (Patient not taking: Reported on 10/27/2019)  . [DISCONTINUED] cephALEXin (KEFLEX) 500 MG capsule Take 1 capsule (500 mg total) by mouth 3 (three) times daily. (Patient not taking: Reported on 10/27/2019)  . [DISCONTINUED] senna (SENOKOT) 8.6 MG TABS tablet Take 1 tablet (8.6 mg total) by mouth daily. (Patient not taking: Reported on 10/27/2019)   No facility-administered encounter medications on file as of 10/27/2019.     REVIEW OF  SYSTEMS  : All other systems reviewed and negative except where noted in the History of Present Illness.   PHYSICAL EXAM: BP 104/60 (BP Location: Left Arm, Patient Position: Sitting, Cuff Size: Normal)   Pulse 82   Temp (!) 96.9 F (36.1 C)   Ht 4\' 11"  (1.499 m)   Wt 119 lb 6 oz (54.1 kg)   BMI 24.11 kg/m  General: Well developed AA female in no acute distress Head: Normocephalic and atraumatic Eyes:  Sclerae anicteric, conjunctiva pink. Ears: Normal auditory acuity Lungs: Clear throughout to auscultation; no increased WOB. Heart: Regular rate and rhythm; no M/R/G. Abdomen: Soft, non-distended.  BS present.  Prominent pubic bone.  LLQ TTP. Musculoskeletal: Symmetrical with no gross deformities  Skin: No lesions on visible extremities Extremities: No edema  Neurological: Alert oriented x 4, grossly non-focal Psychological:  Alert and cooperative. Normal mood and affect  ASSESSMENT AND PLAN: *85 year old female with complaints of left lower quadrant abdominal pain.  After reviewing her chart it looks like that she has had this complaint dating back to at least January 2016.  She had 2 CT scans in 2019 that showed no cause for her pain, but did show moderate stool throughout her colon favoring constipation.  She tells me that she takes MiraLAX daily and moves her bowels daily.  Obviously we are going to try to avoid any invasive testing on her.  I discussed with her and her son stating that we would repeat another CT scan, but if still no cause found then I recommend an aggressive bowel regimen.  We could recommend either a colonoscopy bowel prep or some other type of purge then with MiraLAX once or twice daily and see how she feels after doing this.  Could consider antispasmodic as well, but need to be careful due to side effects with her advanced age.  Will check CBC and BMP today.   CC:  [name], MD

## 2019-10-27 NOTE — Telephone Encounter (Signed)
Spoke with patient's son, Kana Knittel, about potentially bringing Ms. Tech in for a geriatrics clinic appointment.  Unfortunately, she has an appointment with GI this afternoon so will not be able to come in for this.  However, they are interested in being seen in our geriatrics clinic, so we will schedule her for a later date.

## 2019-10-28 NOTE — Progress Notes (Signed)
Reviewed and agree with management plan.  Arvie Bartholomew T. Kacie Huxtable, MD FACG [facility]  

## 2019-11-07 ENCOUNTER — Ambulatory Visit (HOSPITAL_COMMUNITY)
Admission: RE | Admit: 2019-11-07 | Discharge: 2019-11-07 | Disposition: A | Discharge status: Home / Self Care | Payer: Medicare Other | Source: Ambulatory Visit | Attending: Gastroenterology | Admitting: Gastroenterology

## 2019-11-07 ENCOUNTER — Other Ambulatory Visit: Payer: Self-pay

## 2019-11-07 ENCOUNTER — Encounter (HOSPITAL_COMMUNITY): Payer: Self-pay

## 2019-11-07 DIAGNOSIS — R1032 Left lower quadrant pain: Secondary | ICD-10-CM | POA: Insufficient documentation

## 2019-11-07 DIAGNOSIS — R109 Unspecified abdominal pain: Secondary | ICD-10-CM | POA: Diagnosis not present

## 2019-11-07 MED ORDER — IOHEXOL 300 MG/ML  SOLN
100.0000 mL | Freq: Once | INTRAMUSCULAR | Status: AC | PRN
  Administered 2019-11-07: 100 mL via INTRAVENOUS

## 2019-11-07 MED ORDER — SODIUM CHLORIDE (PF) 0.9 % IJ SOLN
INTRAMUSCULAR | Status: AC
  Filled 2019-11-07: qty 50

## 2019-11-08 ENCOUNTER — Other Ambulatory Visit: Payer: Self-pay

## 2019-11-08 MED ORDER — NA SULFATE-K SULFATE-MG SULF 17.5-3.13-1.6 GM/177ML PO SOLN: 1.0000 | Freq: Once | ORAL | 0 refills | Status: AC

## 2019-11-09 ENCOUNTER — Telehealth: Payer: Self-pay | Admitting: *Deleted

## 2019-11-09 NOTE — Telephone Encounter (Signed)
Pt states that she is having "very yellow pee and it burns".  Explained that I was worried about a UTI and since we were closed the next 4 days I would like for her to go to the urgent care.  Pt agreeable.  Christen Bame, CMA

## 2019-11-14 ENCOUNTER — Telehealth: Payer: Self-pay

## 2019-11-14 NOTE — Telephone Encounter (Signed)
Patient son calls nurse line requesting the results and plan from patients recent imaging. According to her son, the patients stomach is still bothering her. You can contact the patient or her son. Please advise.

## 2019-11-16 NOTE — Telephone Encounter (Signed)
Spoke with patient and her son regarding her CT results.  GI had already contacted them about the results of the study showing constipation and recommended that patient complete a bowel prep, which she did last week without any issue.  Reassured patient and her son that her CT scan did not show any concerning cause of her abdominal pain.  Patient reiterated that she has pain on her left side that "draws her up."  This is unfortunately a chronic problem.  I told the patient that this is likely due to osteoarthritis of her hip and that the best that we can do is to try to control her pain is much as possible, but she will likely have some pain every day from this.  Patient and son expressed understanding and were appreciative.

## 2019-11-17 DIAGNOSIS — H02051 Trichiasis without entropian right upper eyelid: Secondary | ICD-10-CM | POA: Diagnosis not present

## 2019-11-20 DIAGNOSIS — R390 Extravasation of urine: Secondary | ICD-10-CM | POA: Diagnosis not present

## 2019-11-26 ENCOUNTER — Other Ambulatory Visit: Payer: Self-pay | Admitting: Family Medicine

## 2019-11-26 DIAGNOSIS — I5033 Acute on chronic diastolic (congestive) heart failure: Secondary | ICD-10-CM

## 2019-11-26 DIAGNOSIS — I872 Venous insufficiency (chronic) (peripheral): Secondary | ICD-10-CM

## 2019-11-28 ENCOUNTER — Other Ambulatory Visit: Payer: Self-pay | Admitting: Family Medicine

## 2019-11-28 DIAGNOSIS — I5033 Acute on chronic diastolic (congestive) heart failure: Secondary | ICD-10-CM

## 2019-11-28 DIAGNOSIS — I872 Venous insufficiency (chronic) (peripheral): Secondary | ICD-10-CM

## 2019-12-02 ENCOUNTER — Telehealth: Payer: Self-pay | Admitting: Family Medicine

## 2019-12-02 NOTE — Telephone Encounter (Signed)
Nira Conn, a Engineer, mining with [facility] called regarding this patient. She said this patient has called her 2 times w/in the last 14 hours, which is very unusual for her. She said Mrs. Donlan just kept saying how she thinks she has cancer and needs something done about it. Nira Conn just wanted to let us know about this in case Winfrey or her nurse wanted to reach out to the patient and check in with her. Nira Conn says she doesn't think shes in need of an emergent appointment right now, but she will look into seeing if she can send someone out to her house just to check in on patient and get her vitals.    If you have any questions, you can all [facility] at [phone] and ask to speak Dr. Leeanne Rio.

## 2019-12-06 ENCOUNTER — Other Ambulatory Visit: Payer: Self-pay | Admitting: Family Medicine

## 2019-12-06 MED ORDER — FAMOTIDINE 20 MG PO TABS: 20.0000 mg | ORAL_TABLET | Freq: Every day | ORAL | 3 refills | Status: DC

## 2019-12-06 NOTE — Telephone Encounter (Signed)
I called Ms. Morash to check in with her after receiving a report that she was calling her [facility] nurse multiple times about my concern for cancer.  She initially did not mention a concern for cancer with me and instead spoke about her chronic abdominal pain and frequent urination.  I asked her about her concern for cancer, and she said she was concerned because she thinks that she is "skin and bones" and that her hair is falling out.  I reassured her that I was not concerned about cancer at this point.  We also decided to try Pepcid again for her abdominal pain and to only take furosemide once daily as needed rather than every day since it does not seem to be reducing her lower extremity edema and it exacerbates her frequent urination.  I reminded her about her clinic visit next month at our geriatrics clinic.  Patient's anxiety and neurocognitive decline may be causing much of her concern about cancer and other somatic symptoms.  She may benefit from a low-dose SSRI if she is interested.  Deke Tilghman C. Shan Levans, MD PGY-3, [facility] 12/06/2019 10:33 AM

## 2019-12-23 ENCOUNTER — Other Ambulatory Visit: Payer: Self-pay | Admitting: *Deleted

## 2019-12-23 DIAGNOSIS — F0390 Unspecified dementia without behavioral disturbance: Secondary | ICD-10-CM

## 2019-12-23 DIAGNOSIS — Z0189 Encounter for other specified special examinations: Secondary | ICD-10-CM

## 2019-12-27 ENCOUNTER — Other Ambulatory Visit: Payer: Self-pay | Admitting: Family Medicine

## 2019-12-27 DIAGNOSIS — I10 Essential (primary) hypertension: Secondary | ICD-10-CM

## 2019-12-29 ENCOUNTER — Encounter: Payer: Self-pay | Admitting: Licensed Clinical Social Worker

## 2019-12-29 ENCOUNTER — Ambulatory Visit: Payer: Self-pay | Admitting: Licensed Clinical Social Worker

## 2019-12-29 DIAGNOSIS — I10 Essential (primary) hypertension: Secondary | ICD-10-CM

## 2019-12-29 DIAGNOSIS — Z7189 Other specified counseling: Secondary | ICD-10-CM

## 2019-12-29 NOTE — Chronic Care Management (AMB) (Signed)
   Clinical Social Work  Care Management referral   12/29/2019 Name: JALAYSHIA ESPEJEL MRN: [id number] DOB: [birth year]  [name] is a 86 y.o. year old female who is a primary care patient of Winfrey, Alcario Drought, MD . LCSW was consulted by [facility] for assistance with assessing for needs.   LCSW reached out to [name] today by phone to introduce self, assess needs. Possible transportation concerns to this appointment due to son's work schedule.  Look under History for additional information.  Intervention:  Solution focused intervention for transportation.  SDOH (Social Determinants of Health) screening performed : challenges identified: Advanced Directives ,   Review of patient status, including review of consultants reports, relevant laboratory and other test results, and collaboration with appropriate care team members and the patient's provider was performed as part of comprehensive patient evaluation and provision of care management services.   Goals Addressed            This Visit's Progress   . needs Advance Directives       Current Barriers:  . Patient with CHF, HTN, and Dementia does not have an Advance Directive . Acknowledges deficits, education and support in order to complete this document . Limited education about the importance of naming a healthcare power of attorney Clinical Social Work Goal(s):  Marland Kitchen Over the next 45 days, the patient will  work with care management team on completion of Advance Directive  Interventions provided by LCSW: . A voluntary discussion about advanced care planning was attempted today will continue discussion with family during office visit Patient Self Care Activities:  . Is unable to complete documentation independently . Able to identify next of kin or [place] of Attorney/Health Care Agent Initial goal documentation    Plan:  LCSW will complete assessment during [place] / [place]   [phone] 4:21 PM

## 2019-12-30 ENCOUNTER — Telehealth: Payer: Self-pay | Admitting: *Deleted

## 2019-12-30 NOTE — Telephone Encounter (Signed)
Heather [name] with [facility] wants to talk to the dr about a care plan for pt. Please call her at [phone] to discuss. [name], CMA

## 2019-12-30 NOTE — Telephone Encounter (Signed)
Spoke with Geni Bers regarding care plan for Ms. Settlemyre.  She reports that Ms. Bucek is concerned about her lower extremity edema and chronic abdominal pain.  We discussed that Ms. Chicoine has said recently that the Lasix does not help her symptoms, but she may have some worsening of her edema when she does not take this daily.  Geni Bers also says that she has not seen Ms. Main wear compression stockings at their home visits, even though she has worn them at our clinic visits.  I asked Geni Bers to ensure that Ms. Zich has these and encourage her to wear them and to acquire them if she no longer has them.  With her edema as well as her chronic abdominal pain, my goal is to reduce unnecessary interventions and medications, so I would like for her to wear compression stockings, keep feet elevated, and reduce sodium consumption.  I do not want to increase Lasix at this time.  Patient may benefit from an SSRI to help reduce her anxiety around these issues.  Thankfully she has an upcoming appointment at our geriatrics clinic where this issues can be discussed and long term planning can also be discussed.  Her neurocognitive decline is also likely increasing her anxiety, and it may not be in her best interest to live by herself.

## 2020-01-04 ENCOUNTER — Other Ambulatory Visit: Payer: Self-pay | Admitting: Family Medicine

## 2020-01-04 DIAGNOSIS — I5033 Acute on chronic diastolic (congestive) heart failure: Secondary | ICD-10-CM

## 2020-01-04 DIAGNOSIS — I872 Venous insufficiency (chronic) (peripheral): Secondary | ICD-10-CM

## 2020-01-05 ENCOUNTER — Ambulatory Visit: Payer: Medicare Other

## 2020-01-10 ENCOUNTER — Telehealth: Payer: Self-pay
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
## 2020-01-11 ENCOUNTER — Telehealth: Payer: Self-pay | Admitting: Family Medicine
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
## 2020-01-12 ENCOUNTER — Ambulatory Visit (INDEPENDENT_AMBULATORY_CARE_PROVIDER_SITE_OTHER): Payer: Medicare Other | Admitting: Family Medicine

## 2020-01-12 ENCOUNTER — Other Ambulatory Visit: Payer: Self-pay

## 2020-01-12 DIAGNOSIS — R1032 Left lower quadrant pain: Secondary | ICD-10-CM

## 2020-01-12 MED ORDER — DULOXETINE HCL 20 MG PO CPEP: 20.0000 mg | ORAL_CAPSULE | Freq: Every day | ORAL | 1 refills | Status: DC

## 2020-01-12 NOTE — Telephone Encounter (Signed)
I have placed her back on the geri wait list.  Hopefully we can get her in in April.  Mily Malecki,CMA

## 2020-01-12 NOTE — Progress Notes (Signed)
   CHIEF COMPLAINT / HPI: Patient comes to appointment unaccompanied using walker  Chronic LLQ abdominal pain  Skin irritation of buttocks: Patient has a history of chronic left lower quadrant abdominal pain for which she has been seen by Dr. Shan Levans as well as multiple specialists including urology and orthopedics.  Patient has had multiple CT scans done of her abdomen which have shown no pathology. Patient has not had any changes in her abdominal pain, but comes in today because she continues to have it. Continues to state that the pain is only in the left lower quadrant and radiates down to the left side of her groin.  She denies any new fevers, chills.  She states that she is having regular bowel movements but that she often is incontinent which causes irritation around her buttocks due to wiping frequently.  She states she showers 3 times a day and uses Vaseline on the area which seems to help.  She denies any blood in her stool, dark stools, dysuria, hematuria, nausea, vomiting.   She has recently been trialed on a PPI with Pepcid which patient notes she does not believe is helping.  PERTINENT  PMH / PSH: HFpEF, HTN, h/o breast cancer (1994)  OBJECTIVE: BP 124/62   Pulse 92   Wt 123 lb (55.8 kg)   SpO2 99%   BMI 24.84 kg/m   General: NAD, pleasant, using walker Neck: Supple, no LAD Cardiovascular: RRR, no m/r/g, no LE edema Respiratory: normal work of breathing Gastrointestinal: soft, nontender, nondistended, normoactive BS, weekend rectus abdominis in LLQ that is noticeably different from right LQ, no hernias or masses noted in LLQ, patient mildly TTP Skin: Dry patch of skin on left gluteal fold with no surrounding redness, discharge or fluctuance Psych: AO, appropriate affect  ASSESSMENT / PLAN:  Abdominal pain, left lower quadrant Chronic problem for patient multiple CTs scans of abdomen have been benign.  Patient currently trialing PPI with famotidine which has not given her any  relief.  Patient is elderly and on exam it is notable that rectus abdominis muscles in left lower quadrant are much weaker than right lower quadrant, may be some associated diastases rectus abdominis which could be causing patient's discomfort although this is not common.  However given her anxiety over chronic abdominal pain, discussed with Dr. Shan Levans and believe that a trial of Cymbalta may help with her low back pain, abdominal pain as well as her anxiety. -Trial 20 mg Cymbalta daily -Discontinue omeprazole given patient is not getting relief and she states she is having trouble swallowing this medication -Follow-up with Dr. Shan Levans in 6 weeks or sooner if any side effects from the medication   Dry skin on L buttocks No signs of infection, no sign of skin breakdown, no redness.  Patient encouraged to continue with Vaseline and is to follow-up if the area worsens or does not improve.  [place] Everhett Bozard, DO PGY-3, [facility]

## 2020-01-12 NOTE — Patient Instructions (Signed)
Thank you for coming to see me today. It was a pleasure! Today we talked about:   We are going to start a medication called Cymbalta to try to help with your chronic abdominal pain.  Please continue using Vaseline on the areas on your backside that are dry and irritated.  We are going to stop your omeprazole.  Please do not take this anymore.  You may continue to take the famotidine before meals as you are doing.  Please follow-up with Dr. Shan Levans in 6 weeks or sooner as needed.   If you have any questions or concerns, please do not hesitate to call the office at [phone].  Take Care,   [place] Adelfa Lozito, DO

## 2020-01-13 NOTE — Assessment & Plan Note (Signed)
Chronic problem for patient multiple CTs scans of abdomen have been benign.  Patient currently trialing PPI with famotidine which has not given her any relief.  Patient is elderly and on exam it is notable that rectus abdominis muscles in left lower quadrant are much weaker than right lower quadrant, may be some associated diastases rectus abdominis which could be causing patient's discomfort although this is not common.  However given her anxiety over chronic abdominal pain, discussed with Dr. Shan Levans and believe that a trial of Cymbalta may help with her low back pain, abdominal pain as well as her anxiety. -Trial 20 mg Cymbalta daily -Discontinue omeprazole given patient is not getting relief and she states she is having trouble swallowing this medication -Follow-up with Dr. Shan Levans in 6 weeks or sooner if any side effects from the medication

## 2020-01-23 ENCOUNTER — Telehealth: Payer: Self-pay | Admitting: Family Medicine

## 2020-01-23 NOTE — Telephone Encounter (Signed)
Evelynn from [employer] is calling stating that she is concerned about pt with her being 33 and living alone. She thinks the pt is going to stop Lasixs because she doesn't like going to the bathroom a lot, and also is complaining about pain, Evelynn advised her to take tylenol 3 times a day. Shes concerned shes going to end up in the hospital. Shes wanting the doctor or nurse to call the pt to advise her on what to do and not to do, and maybe she if she needs to come in. She said she forgets very quick. Please advise! Thanks

## 2020-01-23 NOTE — Telephone Encounter (Signed)
I think Heather [name] would benefit most from an in-person appointment with her son and me as soon as possible.  Would you call them and schedule this please?  Thank you!

## 2020-01-24 NOTE — Telephone Encounter (Signed)
LVM for pt son to call office to help get an appointment scheduled for pt per Dr. Shan Levans. Kindell Strada Zimmerman Rumple, CMA

## 2020-01-26 NOTE — Telephone Encounter (Signed)
Contacted pt and informed her of below and scheduled an appointment for 02/02/19 @ 10:30am with PCP. Brysen Shankman Zimmerman Rumple, CMA

## 2020-02-02 ENCOUNTER — Ambulatory Visit: Payer: Medicare Other | Admitting: Family Medicine

## 2020-02-03 ENCOUNTER — Other Ambulatory Visit: Payer: Self-pay

## 2020-02-03 ENCOUNTER — Encounter: Payer: Self-pay | Admitting: Family Medicine

## 2020-02-03 ENCOUNTER — Ambulatory Visit (INDEPENDENT_AMBULATORY_CARE_PROVIDER_SITE_OTHER): Payer: Medicare Other | Admitting: Family Medicine

## 2020-02-03 VITALS — BP 132/64 | HR 96 | Wt 117.8 lb

## 2020-02-03 DIAGNOSIS — F411 Generalized anxiety disorder: Secondary | ICD-10-CM

## 2020-02-03 DIAGNOSIS — I5033 Acute on chronic diastolic (congestive) heart failure: Secondary | ICD-10-CM

## 2020-02-03 DIAGNOSIS — H9110 Presbycusis, unspecified ear: Secondary | ICD-10-CM

## 2020-02-03 DIAGNOSIS — I872 Venous insufficiency (chronic) (peripheral): Secondary | ICD-10-CM | POA: Diagnosis not present

## 2020-02-03 DIAGNOSIS — H9193 Unspecified hearing loss, bilateral: Secondary | ICD-10-CM

## 2020-02-03 DIAGNOSIS — M160 Bilateral primary osteoarthritis of hip: Secondary | ICD-10-CM

## 2020-02-03 MED ORDER — ALPRAZOLAM 0.25 MG PO TABS: 0.2500 mg | ORAL_TABLET | Freq: Two times a day (BID) | ORAL | 0 refills | Status: DC | PRN

## 2020-02-03 MED ORDER — SPIRONOLACTONE 25 MG PO TABS: 25.0000 mg | ORAL_TABLET | Freq: Every day | ORAL | 3 refills | Status: DC

## 2020-02-03 NOTE — Assessment & Plan Note (Signed)
Explained to patient that Cymbalta can help with both anxiety and pain.  I also wrote this on the medicine bottle for her.  She will plan on taking Cymbalta 20 mg once per day.

## 2020-02-03 NOTE — Progress Notes (Signed)
    SUBJECTIVE:   CHIEF COMPLAINT / HPI:   Chronic pain and anxiety Patient reports that she continues to have pain in her hands as well as in her left lower quadrant.  She did not take the Cymbalta that was prescribed by Dr. Enid Derry at her last visit because she was unsure what it was for.  She read that Cymbalta can be used for anxiety and says that she already takes her alprazolam for this, although she takes her alprazolam very infrequently.  She says that she gets "upset" infrequently but that Xanax helps calm her down when she does.    Hearing difficulty This has been a gradual process.  Patient says that she often has trouble hearing people talking to her.  She is interested in getting a hearing aid if possible.  She says that once she gets her hearing fixed, she will be doing much better.  PERTINENT  PMH / PSH: Neurocognitive decline, chronic functional abdominal pain  OBJECTIVE:   BP 132/64   Pulse 96   Wt 117 lb 12.8 oz (53.4 kg)   SpO2 98%   BMI 23.79 kg/m   General: Frail but well-appearing, appears stated age Cardiac: RRR, no MRG Respiratory: CTAB, no rhonchi, rales, or wheezing, normal work of breathing Abdomen: Soft, no guarding, tender to palpation in the LLQ, stable from previous exams  ASSESSMENT/PLAN:   Osteoarthritis Explained to patient that Cymbalta can help with both anxiety and pain.  I also wrote this on the medicine bottle for her.  She will plan on taking Cymbalta 20 mg once per day.  Hearing decreased, bilateral Sent referral to audiology today.  Patient would benefit from a hearing aid as she may have significantly improved quality of life with this.  Anxiety state Refilled alprazolam.  This is a very low dose at 0.25 mg and patient takes it very infrequently.  PDMP interrogated, no red flags.     [name], MD [place]

## 2020-02-03 NOTE — Assessment & Plan Note (Signed)
Sent referral to audiology today.  Patient would benefit from a hearing aid as she may have significantly improved quality of life with this.

## 2020-02-03 NOTE — Assessment & Plan Note (Addendum)
Refilled alprazolam.  This is a very low dose at 0.25 mg and patient takes it very infrequently.  PDMP interrogated, no red flags.

## 2020-02-03 NOTE — Patient Instructions (Signed)
It was nice seeing you today Ms. Standifer!  Please take your cymbalta once per day for pain.  Please only take alprazolam when you feel very anxious.  I am referring you to a hearing doctor.  He or she should call you in the next few weeks.  I have refilled your spironolactone and alprazolam.  You may take your lasix every other day.  If you have any questions or concerns, please feel free to call the clinic.   Be well,  Dr. Shan Levans

## 2020-02-07 ENCOUNTER — Other Ambulatory Visit: Payer: Self-pay | Admitting: Family Medicine

## 2020-02-07 DIAGNOSIS — I5033 Acute on chronic diastolic (congestive) heart failure: Secondary | ICD-10-CM

## 2020-02-07 DIAGNOSIS — I872 Venous insufficiency (chronic) (peripheral): Secondary | ICD-10-CM

## 2020-02-08 ENCOUNTER — Other Ambulatory Visit: Payer: Self-pay

## 2020-02-08 DIAGNOSIS — I872 Venous insufficiency (chronic) (peripheral): Secondary | ICD-10-CM

## 2020-02-08 DIAGNOSIS — I5033 Acute on chronic diastolic (congestive) heart failure: Secondary | ICD-10-CM

## 2020-02-08 NOTE — Telephone Encounter (Signed)
Pt's son calls nurse line regarding refill of lasix. Per office visit note on 3/17, refill was to be ordered, however, Lasix order was set to historical med. Please resend if appropriate.   Talbot Grumbling, RN

## 2020-02-09 MED ORDER — FUROSEMIDE 20 MG PO TABS: ORAL_TABLET | ORAL | 0 refills | Status: DC

## 2020-02-17 ENCOUNTER — Ambulatory Visit (HOSPITAL_COMMUNITY)
Admission: EM | Admit: 2020-02-17 | Discharge: 2020-02-17 | Disposition: A | Discharge status: Home / Self Care | Payer: Medicare Other | Attending: Family Medicine | Admitting: Family Medicine

## 2020-02-17 ENCOUNTER — Encounter (HOSPITAL_COMMUNITY): Payer: Self-pay

## 2020-02-17 ENCOUNTER — Other Ambulatory Visit: Payer: Self-pay

## 2020-02-17 DIAGNOSIS — M545 Low back pain, unspecified: Secondary | ICD-10-CM

## 2020-02-17 DIAGNOSIS — R1032 Left lower quadrant pain: Secondary | ICD-10-CM | POA: Insufficient documentation

## 2020-02-17 LAB — POCT URINALYSIS DIP (DEVICE)
Bilirubin Urine: NEGATIVE
Glucose, UA: NEGATIVE mg/dL
Ketones, ur: NEGATIVE mg/dL
Nitrite: NEGATIVE
Protein, ur: NEGATIVE mg/dL
Specific Gravity, Urine: 1.02 (ref 1.005–1.030)
Urobilinogen, UA: 0.2 mg/dL (ref 0.0–1.0)
pH: 6 (ref 5.0–8.0)

## 2020-02-17 LAB — CBC WITH DIFFERENTIAL/PLATELET
Abs Immature Granulocytes: 0.02 10*3/uL (ref 0.00–0.07)
Basophils Absolute: 0 10*3/uL (ref 0.0–0.1)
Basophils Relative: 1 %
Eosinophils Absolute: 0.1 10*3/uL (ref 0.0–0.5)
Eosinophils Relative: 3 %
HCT: 41.1 % (ref 36.0–46.0)
Hemoglobin: 13.2 g/dL (ref 12.0–15.0)
Immature Granulocytes: 0 %
Lymphocytes Relative: 33 %
Lymphs Abs: 1.7 10*3/uL (ref 0.7–4.0)
MCH: 30.8 pg (ref 26.0–34.0)
MCHC: 32.1 g/dL (ref 30.0–36.0)
MCV: 96 fL (ref 80.0–100.0)
Monocytes Absolute: 0.7 10*3/uL (ref 0.1–1.0)
Monocytes Relative: 13 %
Neutro Abs: 2.6 10*3/uL (ref 1.7–7.7)
Neutrophils Relative %: 50 %
Platelets: 195 10*3/uL (ref 150–400)
RBC: 4.28 MIL/uL (ref 3.87–5.11)
RDW: 13 % (ref 11.5–15.5)
WBC: 5.2 10*3/uL (ref 4.0–10.5)
nRBC: 0 % (ref 0.0–0.2)

## 2020-02-17 LAB — COMPREHENSIVE METABOLIC PANEL
ALT: 21 U/L (ref 0–44)
AST: 27 U/L (ref 15–41)
Albumin: 3.7 g/dL (ref 3.5–5.0)
Alkaline Phosphatase: 35 U/L — ABNORMAL LOW (ref 38–126)
Anion gap: 11 (ref 5–15)
BUN: 21 mg/dL (ref 8–23)
CO2: 26 mmol/L (ref 22–32)
Calcium: 9.2 mg/dL (ref 8.9–10.3)
Chloride: 103 mmol/L (ref 98–111)
Creatinine, Ser: 1.15 mg/dL — ABNORMAL HIGH (ref 0.44–1.00)
GFR calc Af Amer: 46 mL/min — ABNORMAL LOW (ref >60)
GFR calc non Af Amer: 39 mL/min — ABNORMAL LOW (ref >60)
Glucose, Bld: 96 mg/dL (ref 70–99)
Potassium: 4 mmol/L (ref 3.5–5.1)
Sodium: 140 mmol/L (ref 135–145)
Total Bilirubin: 0.8 mg/dL (ref 0.3–1.2)
Total Protein: 6.5 g/dL (ref 6.5–8.1)

## 2020-02-17 NOTE — ED Triage Notes (Signed)
Pt c/o 10/10 LLQ abdominal pain that radiates to left lower back when pt walks. Pt states when she drinks a lot of water it feels like it's going to her left shoulder and arm. Pt denies N/V/D. Pt states last BM today.

## 2020-02-17 NOTE — ED Provider Notes (Signed)
[place]    CSN: [id number] Arrival date & time: 02/17/20  1502      History   Chief Complaint Chief Complaint  Patient presents with  . Abdominal Pain    HPI Heather [name] is a 86 y.o. female.   Patient reports ongoing left lower quadrant pain.  She has been seen in this office over the last 3 years for the same.  She reports that she does have GI relationship established.  States that they just gave her a pill, and she does not want just a pill she would like an answer.  Reports dysuria, urinary frequency, urinary urgency.  Denies foul smell.  Endorses incontinence.  Is not treating this at home.  States that she also takes a diuretic so that has her urinating more often.  Patient also report low back pain on the left side.  States that she thinks this may be radiating around from her abdomen.  Per chart review, it appears the patient has been treated for diverticulitis on multiple occasions.  Also has history of UTIs.  Of note, patient also has history of renal cyst, there is no differentiation of right or left kidney.  ROS per HPI  The history is provided by the patient.    Past Medical History:  Diagnosis Date  . Allergic rhinitis, cause unspecified 02/21/2014  . Anxiety state, unspecified   . Breast cancer ([place])    NO STICK OR BLOOD PRESSURE CHECKS IN LEFT ARM  . Carpal tunnel syndrome   . Cervical spondylosis   . Chronic pain syndrome 03/03/2019  . Colon polyp 12/19/2009   Transverse-polypoid colorectal mucosa  . Constipation   . Degenerative joint disease   . Diastolic dysfunction   . Diverticula of colon   . Gastritis, chronic   . Hiatal hernia   . Hypertension   . Low back pain   . RBBB (right bundle branch block with left anterior fascicular block)   . Renal cyst   . Venous insufficiency     Patient Active Problem List   Diagnosis Date Noted  . SI (sacroiliac) joint inflammation ([place]) 10/04/2019  . DDD (degenerative disc disease), lumbar  10/04/2019  . Hearing decreased, bilateral 06/30/2019  . Chronic pain syndrome 03/03/2019  . Polyuria 02/22/2019  . Unilateral primary osteoarthritis, right knee 01/13/2019  . Back pain 04/08/2018  . Medication management 02/28/2018  . Sacral pain 02/07/2018  . Urinary incontinence without sensory awareness 12/02/2017  . Arthritis of left hip 10/20/2017  . Functional abdominal pain syndrome 09/02/2017  . Encounter for chronic pain management 07/22/2017  . Dementia ([place]) 06/25/2017  . Vitamin D deficiency 05/21/2017  . Abnormal CT scan of lung 07/16/2015  . Chronic LLQ pain 11/23/2014  . Esophageal dysmotility 10/27/2014  . Cough 10/21/2014  . Arthritis of hand, degenerative 04/10/2014  . Other and unspecified hyperlipidemia 04/10/2014  . LPRD (laryngopharyngeal reflux disease) 04/04/2014  . Abdominal pain, left lower quadrant 06/22/2013  . Chronic bronchitis ([place]) 05/30/2013  . Hx of breast cancer 08/30/2012  . Edema 06/12/2011  . GERD 03/27/2010  . Diastolic CHF ([place]) [id number]  . Constipation 01/18/2009  . Carpal tunnel syndrome on both sides 10/03/2008  . Cervical spondylosis without myelopathy 03/21/2008  . Diaphragmatic hernia 12/26/2007  . Renal cyst 12/26/2007  . Anxiety state 09/15/2007  . Primary hypertension 09/15/2007  . Venous (peripheral) insufficiency 09/15/2007  . Osteoarthritis 09/15/2007  . Lumbago 09/15/2007  . Diverticulosis of colon 05/24/2002    Past Surgical  History:  Procedure Laterality Date  . CARPAL TUNNEL RELEASE    . Left mastectomy      OB History   No obstetric history on file.      Home Medications    Prior to Admission medications   Medication Sig Start Date End Date Taking? Authorizing Provider  albuterol (VENTOLIN HFA) 108 (90 Base) MCG/ACT inhaler Inhale 2 puffs into the lungs every 6 (six) hours as needed for wheezing or shortness of breath.   Yes [provider]  ALPRAZolam (XANAX) 0.25 MG tablet Take 1 tablet  (0.25 mg total) by mouth 2 (two) times daily as needed. for anxiety 02/03/20   [name], MD  baclofen (LIORESAL) 10 MG tablet Take 1 tablet (10 mg total) by mouth 2 (two) times daily as needed for muscle spasms. 06/03/19   [name], MD  carvedilol (COREG) 3.125 MG tablet Take 1 tablet by mouth twice daily 12/28/19   [name], MD  cholecalciferol (VITAMIN D) 1000 units tablet Take 1,000 Units by mouth daily.    [provider]  DULoxetine (CYMBALTA) 20 MG capsule Take 1 capsule (20 mg total) by mouth daily. 01/12/20   Shirley, [place], DO  famotidine (PEPCID) 20 MG tablet Take 1 tablet (20 mg total) by mouth daily. 12/06/19   [name], MD  feeding supplement, ENSURE ENLIVE, (ENSURE ENLIVE) LIQD Take 237 mLs by mouth 2 (two) times daily between meals. 09/30/16   Nche, Charlene Brooke, NP  furosemide (LASIX) 20 MG tablet Take one tablet every other day. 02/09/20   Shirley, [place], DO  levocetirizine (XYZAL) 5 MG tablet Take 5 mg by mouth as needed.  05/18/18   [provider]  Multiple Vitamins-Minerals (MULTIVITAMINS THER. W/MINERALS) TABS Take 1 tablet by mouth daily.    [provider]  omeprazole (PRILOSEC) 20 MG capsule Take 20 mg by mouth daily. 10/25/19   [provider]  polyethylene glycol powder (GLYCOLAX/MIRALAX) powder Take 17 g by mouth daily as needed. 04/27/18   Julianne Rice, MD  spironolactone (ALDACTONE) 25 MG tablet Take 1 tablet (25 mg total) by mouth daily. 02/03/20   [name], MD    Family History Family History  Problem Relation Age of Onset  . Colon cancer Neg Hx   . Pancreatic cancer Neg Hx   . Esophageal cancer Neg Hx   . Stomach cancer Neg Hx     Social History Social History   Tobacco Use  . Smoking status: Never Smoker  . Smokeless tobacco: Never Used  Substance Use Topics  . Alcohol use: No    Alcohol/week: 0.0 standard drinks  . Drug use: No     Allergies   Fentanyl and Naprosyn  [naproxen]   Review of Systems Review of Systems   Physical Exam Triage Vital Signs ED Triage Vitals  Enc Vitals Group     BP 02/17/20 1539 117/68     Pulse Rate 02/17/20 1539 79     Resp 02/17/20 1539 18     Temp 02/17/20 1539 (!) 97.4 F (36.3 C)     Temp Source 02/17/20 1539 Oral     SpO2 02/17/20 1539 98 %     Weight 02/17/20 1540 118 lb (53.5 kg)     Height 02/17/20 1540 4\' 10"  (1.473 m)     Head Circumference --      Peak Flow --      Pain Score 02/17/20 1540 10     Pain Loc --  Pain Edu? --      Excl. in Advance? --    No data found.  Updated Vital Signs BP 117/68   Pulse 79   Temp (!) 97.4 F (36.3 C) (Oral)   Resp 18   Ht 4\' 10"  (1.473 m)   Wt 118 lb (53.5 kg)   SpO2 98%   BMI 24.66 kg/m   Visual Acuity Right Eye Distance:   Left Eye Distance:   Bilateral Distance:    Right Eye Near:   Left Eye Near:    Bilateral Near:     Physical Exam Vitals and nursing note reviewed.  Constitutional:      General: She is not in acute distress.    Appearance: She is well-developed. She is not ill-appearing.  HENT:     Head: Normocephalic and atraumatic.     Mouth/Throat:     Mouth: Mucous membranes are moist.     Pharynx: Oropharynx is clear.  Eyes:     Extraocular Movements: Extraocular movements intact.     Conjunctiva/sclera: Conjunctivae normal.     Pupils: Pupils are equal, round, and reactive to light.  Cardiovascular:     Rate and Rhythm: Normal rate and regular rhythm.     Heart sounds: Normal heart sounds. No murmur. No friction rub. No gallop.   Pulmonary:     Effort: Pulmonary effort is normal. No respiratory distress.     Breath sounds: Normal breath sounds. No stridor. No wheezing, rhonchi or rales.  Chest:     Chest wall: No tenderness.  Abdominal:     General: Abdomen is flat. There is no distension or abdominal bruit. There are no signs of injury.     Palpations: Abdomen is soft.     Tenderness: There is abdominal tenderness in the  left lower quadrant. There is no right CVA tenderness, left CVA tenderness, guarding or rebound. Negative signs include Murphy's sign, Rovsing's sign, McBurney's sign, psoas sign and obturator sign.     Hernia: No hernia is present.  Musculoskeletal:     Cervical back: Neck supple.  Skin:    General: Skin is warm and dry.     Capillary Refill: Capillary refill takes less than 2 seconds.  Neurological:     General: No focal deficit present.     Mental Status: She is alert and oriented to person, place, and time.  Psychiatric:        Mood and Affect: Mood normal.        Behavior: Behavior normal.      UC Treatments / Results  Labs (all labs ordered are listed, but only abnormal results are displayed) Labs Reviewed  POCT URINALYSIS DIP (DEVICE) - Abnormal; Notable for the following components:      Result Value   Hgb urine dipstick SMALL (*)    Leukocytes,Ua TRACE (*)    All other components within normal limits  URINE CULTURE  CBC WITH DIFFERENTIAL/PLATELET  COMPREHENSIVE METABOLIC PANEL    EKG   Radiology No results found.  Procedures Procedures (including critical care time)  Medications Ordered in UC Medications - No data to display  Initial Impression / Assessment and Plan / UC Course  I have reviewed the triage vital signs and the nursing notes.  Pertinent labs & imaging results that were available during my care of the patient were reviewed by me and considered in my medical decision making (see chart for details).     Presents for left lower quadrant pain in left  lower back pain without sciatica.  She can use Tylenol and may be some heat to her lower back.  History of diverticula as well.  Patient declines antibiotics today.  UA in office with trace blood and small leuks.  Will culture urine and inform patient if antibiotics are needed.  We will also check CBC and CMP to help rule out infection, anemia, issues with kidney and liver function.  Instructed patient  to continue to stay well-hydrated and drink lots of water.  Instructed that she can continue her current home medication regimen.  Instructed to go to the ER with trouble swallowing, trouble breathing, high fever, other concerning symptoms. Final Clinical Impressions(s) / UC Diagnoses   Final diagnoses:  Left lower quadrant abdominal pain  Left-sided low back pain without sciatica, unspecified chronicity     Discharge Instructions     You may have a urinary tract infection. We are going to culture your urine and will call you as soon as we have the results.   Drink plenty of water, 8-10 glasses per day.   You may take AZO over the counter for painful urination.  Follow up with your primary care provider as needed.   We are checking your blood as well for infection, liver function and kidney function.  Will inform you if these results are abnormal.  Results will also be available on MyChart.  Go to the Emergency Department if you experience severe pain, shortness of breath, high fever, or other concerns.      ED Prescriptions    None     I have reviewed the PDMP during this encounter.   Faustino Congress, NP 02/17/20 1627

## 2020-02-17 NOTE — Discharge Instructions (Addendum)
You may have a urinary tract infection. We are going to culture your urine and will call you as soon as we have the results.   Drink plenty of water, 8-10 glasses per day.   You may take AZO over the counter for painful urination.  Follow up with your primary care provider as needed.   We are checking your blood as well for infection, liver function and kidney function.  Will inform you if these results are abnormal.  Results will also be available on MyChart.  Go to the Emergency Department if you experience severe pain, shortness of breath, high fever, or other concerns.

## 2020-02-19 LAB — URINE CULTURE: Culture: 20000 — AB

## 2020-02-20 ENCOUNTER — Telehealth (HOSPITAL_COMMUNITY): Payer: Self-pay | Admitting: *Deleted

## 2020-02-20 NOTE — Telephone Encounter (Signed)
Spoke with patient regarding UA culture and blood work. No changes in treatment per Dr Lanny Cramp. Patient states she woke up this am with back pain and urine is darker today. Encouraged patient to continue to drink fluids, states she is only drinking about 3 water bottles a day. Patient to follow up as needed if symptoms continue. Spoke with patient approx 11min.

## 2020-02-21 ENCOUNTER — Telehealth: Payer: Self-pay

## 2020-02-21 NOTE — Telephone Encounter (Signed)
Patient calls nurse line requesting follow up appointment with provider regarding painful urination. Patient was seen in urgent care on 02/17/20 for same issue. Patient reports no improvement and that symptoms have worsened. Reports painful urination, darker urine, back pain, abdominal pain, and malodorous urine.   Appointment made for tomorrow afternoon.   ED precautions given.   To PCP  Talbot Grumbling, RN

## 2020-02-22 ENCOUNTER — Ambulatory Visit: Payer: Medicare Other | Admitting: Family Medicine

## 2020-02-22 ENCOUNTER — Other Ambulatory Visit: Payer: Self-pay

## 2020-02-22 DIAGNOSIS — G894 Chronic pain syndrome: Secondary | ICD-10-CM

## 2020-02-22 NOTE — Progress Notes (Signed)
    SUBJECTIVE:   CHIEF COMPLAINT / HPI:   Chronic abdominal pain Continues to have LLQ pain, which she says is worse recently.  She says that this area of her abdomen has gotten "flatter" recently.  Her bowel movements occur every day and takes Miralax twice daily.  She also eats a good amount of fruit to keep from becoming constipated.  Her appetite continues to be good and she eats a varied diet, including from [employer].    PERTINENT  PMH / PSH: Chronic abdominal pain, arthritis of left hip, dementia, anxiety state, constipation  OBJECTIVE:   BP 106/62   Pulse 80   Wt 121 lb (54.9 kg)   SpO2 96%   BMI 25.29 kg/m   General: Appears well, in NAD, appears stated age Cardiac: RRR, no MRG Respiratory: CTAB, no rhonchi, rales, or wheezing, normal work of breathing Abdomen: Soft, tender to palpation especially in the LLQ, which is consistent with prior exam.  No masses or other abnormalities palpated.   ASSESSMENT/PLAN:   Chronic pain syndrome Reassured today that patient was upbeat and spoke at length about her life as a child, so she was very distractible and seemed quite comfortable.  Does not appear to be constipated and has a very good appetite.  We will continue to reassure her and hold off on doing any intervention since she is doing quite well for her age, and the risks of imaging or other procedures would likely outweigh the benefits.     [name], MD [place]

## 2020-02-22 NOTE — Patient Instructions (Addendum)
It was nice seeing you today Heather [name]!  I believe that your abdominal pain is not anything dangerous to you.  I am happy that you are able to eat lots of healthy food and are having normal bowel movements.  You can try to increase your MiraLAX a little bit and continue to take it twice a day since you may have some constipation.  If you bring your medications in next time, I will write down what they are for on the medication bottle.  You can follow-up with me as needed.  If you have any questions or concerns, please feel free to call the clinic.   Be well,  Dr. Shan Levans

## 2020-02-23 NOTE — Assessment & Plan Note (Addendum)
Reassured today that patient was upbeat and spoke at length about her life as a child, so she was very distractible and seemed quite comfortable.  Does not appear to be constipated and has a very good appetite.  We will continue to reassure her and hold off on doing any intervention since she is doing quite well for her age, and the risks of imaging or other procedures would likely outweigh the benefits.

## 2020-02-28 ENCOUNTER — Telehealth: Payer: Self-pay | Admitting: *Deleted

## 2020-02-28 NOTE — Telephone Encounter (Signed)
Left a message for son [name] to update him on geriatric clinic openings in May.  Asked him to call back to get this scheduled with [place] on a Thursday afternoon.  Lasondra Hodgkins,CMA

## 2020-03-01 NOTE — Telephone Encounter (Signed)
Attempted to call son again but was unable to reach him.  Will forward to MD to see if she would like me to send him a letter inquiring about this appointment and if they are still interested or if she wants to write one.  Jazmin Hartsell,CMA

## 2020-03-06 ENCOUNTER — Telehealth: Payer: Self-pay

## 2020-03-06 NOTE — Telephone Encounter (Signed)
Patient calls nurse line regarding continuous abdominal pain. Patient reports that she continues to have abdominal pain that is radiating to her lower back. During conversation patient requests specialist for her abdominal pain. Patient has been seen for this pain in the office on 02/22/20.   Patient also has questions regarding safety of COVID vaccine with her age, medications and medical conditions.   To PCP  Please advise  Talbot Grumbling, RN

## 2020-03-06 NOTE — Telephone Encounter (Signed)
I spoke with Ms. Ing's son regarding her abdominal pain, vaccine questions, and an appointment with our geriatrics clinic.  I told her son that because of Ms. Biehler's age and prior normal CT scans, there is not much that we can do about her chronic abdominal pain other than try to control her pain is much as possible.  I understand that she worries about this pain a lot, but when she sees me, she appears comfortable and often talks about other issues.  I also highly encouraged her to get the Covid vaccine since these are safe in the elderly and she will be at higher risk for complications due to Covid due to her age.  I also told her son that there is an opening on May 20 for an appointment at our geriatrics clinic.  He says that he cannot make a 1:45 apointment but could come to a 2:45 slot if that is available.  He understands that they both need to be present for the appointment.  He would like to schedule this appointment at 2:45 PM if possible.  I will route this to Jazmin as well so that she can hopefully set up this appointment.

## 2020-03-09 ENCOUNTER — Other Ambulatory Visit: Payer: Self-pay | Admitting: Family Medicine

## 2020-03-09 DIAGNOSIS — I5033 Acute on chronic diastolic (congestive) heart failure: Secondary | ICD-10-CM

## 2020-03-09 DIAGNOSIS — I872 Venous insufficiency (chronic) (peripheral): Secondary | ICD-10-CM

## 2020-03-13 NOTE — Telephone Encounter (Signed)
Will forward to Dr. McDiarmid to chart review and see if a full geri assessment will be needed.  If not, we may be able to get her in at this requested time.  Mikal Wisman,CMA

## 2020-03-23 ENCOUNTER — Telehealth: Payer: Self-pay | Admitting: *Deleted

## 2020-03-23 NOTE — Telephone Encounter (Signed)
-----   Message from [place] Pink, [place] sent at 03/23/2020  9:54 AM EDT ----- Regarding: Cologuard Cologuard due

## 2020-03-23 NOTE — Telephone Encounter (Signed)
Contacted pt to remind her to complete her Cologuard and to get it sent back.  She did not understand what I was telling her so I requested she have her son call the office so that we can inform him.Doneisha Ivey Zimmerman Rumple, CMA

## 2020-03-29 ENCOUNTER — Telehealth: Payer: Self-pay

## 2020-03-29 NOTE — Telephone Encounter (Signed)
Patient's son calls nurse line regarding patient having continuing back and abdominal pain.   Noticed in chart review patient is to be scheduling for [facility].   Please advise how patient should proceed.    To PCP  Talbot Grumbling, RN

## 2020-03-29 NOTE — Telephone Encounter (Signed)
I called Ms. Moffitt's son and left a VM asking him to call back if Ms. Moening's chronic abdominal pain is significantly worse than normal or if it is inhibiting her activities of daily living.  I also reminded him of her geriatrics appointment with [place] on 5/20 at 2:45.

## 2020-04-02 DIAGNOSIS — N3281 Overactive bladder: Secondary | ICD-10-CM | POA: Diagnosis not present

## 2020-04-05 DIAGNOSIS — H10023 Other mucopurulent conjunctivitis, bilateral: Secondary | ICD-10-CM | POA: Diagnosis not present

## 2020-04-05 DIAGNOSIS — H02054 Trichiasis without entropian left upper eyelid: Secondary | ICD-10-CM | POA: Diagnosis not present

## 2020-04-05 DIAGNOSIS — H40013 Open angle with borderline findings, low risk, bilateral: Secondary | ICD-10-CM | POA: Diagnosis not present

## 2020-04-05 DIAGNOSIS — H02055 Trichiasis without entropian left lower eyelid: Secondary | ICD-10-CM | POA: Diagnosis not present

## 2020-04-09 NOTE — Telephone Encounter (Signed)
Patients son calls nurse line stating his mother has called 3x this morning complaining of continued abdominal and back pain. Son states, "the same as usual," no new onset of symptoms. No issues with going to the bathroom. Will forward to MD for suggestions or apt?

## 2020-04-09 NOTE — Telephone Encounter (Signed)
You can make an appointment for her to see me in one of my regular or access to care appointments.  If none of those appointment times work for her, she can see another provider.  Since this pain is not outside of her baseline, this appointment is not urgent.  Thank you.

## 2020-04-11 NOTE — Telephone Encounter (Signed)
LVM for pt son to call office to inform him of below and to assist in getting an appointment scheduled for pt if that is what they want to do.  Please assist him in getting this scheduled if he would like pt to come in office.Heather [name], CMA

## 2020-04-12 NOTE — Telephone Encounter (Signed)
Patient's son called nurse line yesterday afternoon, scheduled appointment for 6/14 with Dr. Shan Levans.   Patient returns call to nurse line this AM. Patient states that she is concerned regarding her medications. Patient states that urologist put her on mirabegron. However, she is also taking the lasix prescribed by Dr. Shan Levans. Patient wants to know if she should be taking both of these medications.   To PCP  Please advise  Talbot Grumbling, RN

## 2020-04-13 ENCOUNTER — Telehealth: Payer: Self-pay | Admitting: Family Medicine

## 2020-04-13 NOTE — Telephone Encounter (Signed)
Called and left a voice message with Heather [name]'s son, Heather [name], stating that I would prefer that she stop the furosemide.  However, we have tried this before, and she has felt like the swelling in her legs was worse after stopping it.  Stopping the furosemide may help relieve some of her urinary incontinence, but I asked him to call in the next few weeks if she would prefer to restart it due to increased lower extremity edema.  We can also discuss this further at her upcoming appointment with me.

## 2020-04-16 ENCOUNTER — Other Ambulatory Visit: Payer: Self-pay | Admitting: Family Medicine

## 2020-04-16 DIAGNOSIS — F411 Generalized anxiety disorder: Secondary | ICD-10-CM

## 2020-04-16 DIAGNOSIS — I872 Venous insufficiency (chronic) (peripheral): Secondary | ICD-10-CM

## 2020-04-16 DIAGNOSIS — I5033 Acute on chronic diastolic (congestive) heart failure: Secondary | ICD-10-CM

## 2020-04-16 NOTE — Progress Notes (Signed)
Cardiology Office Note:    Date:  04/17/2020   ID:  Heather [name], DOB 09-Jul-1918, MRN [id number]  PCP:  [name], MD  Cardiologist:  Jenkins Rouge, MD   Electrophysiologist:  None   Referring MD: [name], MD   Chief Complaint:  Follow-up (CHF)    Patient Profile:    Heather [name] is a 86 y.o. female with:   Diastolic CHF  Hypertension   Chest pain   Myoview in 2007, 2009 normal   Coronary Ca by CT in 2014  Breast CA  S/p L mastectomy   Chronic cough   Lower ext edema   Anxiety/Depression   Diverticulosis   Dementia   Prior CV studies: ABIs 03/31/16 Normal, bilaterally   Echocardiogram 11/08/09 EF 55-60, mild AI, MAC   History of Present Illness:    Heather [name] was last seen by Dr. Johnsie Cancel in 11/2018.   She returns for follow up.  She is here alone.  She has not had any chest pain, syncope, orthopnea.  She has chronic lower extremity swelling which is fairly stable.  She sometimes wears compression hose.  She does note dyspnea with some activities but overall, this is stable.  Past Medical History:  Diagnosis Date  . Allergic rhinitis, cause unspecified 02/21/2014  . Anxiety state, unspecified   . Breast cancer ([place])    NO STICK OR BLOOD PRESSURE CHECKS IN LEFT ARM  . Carpal tunnel syndrome   . Cervical spondylosis   . Chronic pain syndrome 03/03/2019  . Colon polyp 12/19/2009   Transverse-polypoid colorectal mucosa  . Constipation   . Degenerative joint disease   . Diastolic dysfunction   . Diverticula of colon   . Gastritis, chronic   . Hiatal hernia   . Hypertension   . Low back pain   . RBBB (right bundle branch block with left anterior fascicular block)   . Renal cyst   . Venous insufficiency     Current Medications: Current Meds  Medication Sig  . albuterol (VENTOLIN HFA) 108 (90 Base) MCG/ACT inhaler Inhale 2 puffs into the lungs every 6 (six) hours as needed for wheezing or shortness of breath.  . ALPRAZolam  (XANAX) 0.25 MG tablet Take 1 tablet (0.25 mg total) by mouth 2 (two) times daily as needed. for anxiety  . carvedilol (COREG) 3.125 MG tablet Take 1 tablet by mouth twice daily  . cholecalciferol (VITAMIN D) 1000 units tablet Take 1,000 Units by mouth daily.  . DULoxetine (CYMBALTA) 20 MG capsule Take 1 capsule (20 mg total) by mouth daily.  . famotidine (PEPCID) 20 MG tablet Take 1 tablet (20 mg total) by mouth daily.  . feeding supplement, ENSURE ENLIVE, (ENSURE ENLIVE) LIQD Take 237 mLs by mouth 2 (two) times daily between meals.  . furosemide (LASIX) 20 MG tablet TAKE 1 TABLET BY MOUTH EVERY OTHER DAY  . levocetirizine (XYZAL) 5 MG tablet Take 5 mg by mouth as needed.   . Multiple Vitamins-Minerals (MULTIVITAMINS THER. W/MINERALS) TABS Take 1 tablet by mouth daily.  Marland Kitchen omeprazole (PRILOSEC) 20 MG capsule Take 20 mg by mouth daily.  . polyethylene glycol powder (GLYCOLAX/MIRALAX) powder Take 17 g by mouth daily as needed.  Marland Kitchen spironolactone (ALDACTONE) 25 MG tablet Take 1 tablet (25 mg total) by mouth daily.  Marland Kitchen tobramycin (TOBREX) 0.3 % ophthalmic solution      Allergies:   Fentanyl and Naprosyn [naproxen]   Social History   Tobacco Use  . Smoking status: Never  Smoker  . Smokeless tobacco: Never Used  Substance Use Topics  . Alcohol use: No    Alcohol/week: 0.0 standard drinks  . Drug use: No     Family Hx: The patient's family history is negative for Colon cancer, Pancreatic cancer, Esophageal cancer, and Stomach cancer.  ROS   EKGs/Labs/Other Test Reviewed:    EKG:  EKG is   ordered today.  The ekg ordered today demonstrates normal sinus rhythm, heart rate 87, left axis deviation, right bundle branch block, QTC 481, no change from prior tracing  Recent Labs: 02/17/2020: ALT 21; BUN 21; Creatinine, Ser 1.15; Hemoglobin 13.2; Platelets 195; Potassium 4.0; Sodium 140   Recent Lipid Panel Lab Results  Component Value Date/Time   CHOL 199 02/02/2014 10:55 AM   TRIG 20.0  02/02/2014 10:55 AM   HDL 98.70 02/02/2014 10:55 AM   CHOLHDL 2 02/02/2014 10:55 AM   LDLCALC 96 02/02/2014 10:55 AM    Physical Exam:    VS:  BP 110/66   Pulse 87   Ht _0  (1.473 m)   Wt 112 lb (50.8 kg)   SpO2 98%   BMI 23.41 kg/m     Wt Readings from Last 3 Encounters:  04/17/20 112 lb (50.8 kg)  02/22/20 121 lb (54.9 kg)  02/17/20 118 lb (53.5 kg)     Constitutional:      Appearance: Healthy appearance. Not in distress.  Neck:     Thyroid: No thyromegaly.     Vascular: JVD normal.  Pulmonary:     Breath sounds: No wheezing. No rales.  Cardiovascular:     Normal rate. Regular rhythm. Normal S1. Normal S2.     Murmurs: There is no murmur.  Edema:    Pretibial: bilateral 2+ edema of the pretibial area. Abdominal:     Palpations: Abdomen is soft. There is no hepatomegaly.  Skin:    General: Skin is warm and dry.  Neurological:     General: No focal deficit present.     Mental Status: Alert and oriented to person, place and time.       ASSESSMENT & PLAN:    1. Chronic diastolic congestive heart failure (HCC) Overall, her volume status appears to be stable.  Her blood pressure is well controlled.  She has chronic lower extremity swelling mainly related to venous insufficiency.  We discussed the importance of wearing compression hose today.  Continue current dose of furosemide and spironolactone.  Recent labs with primary care demonstrate stable renal function and potassium.  Follow-up in 1 year or sooner as needed.    Dispo:  Return in about 1 year (around 04/17/2021) for Routine Follow Up w/ Dr. Johnsie Cancel, in person.   Medication Adjustments/Labs and Tests Ordered: Current medicines are reviewed at length with the patient today.  Concerns regarding medicines are outlined above.  Tests Ordered: Orders Placed This Encounter  Procedures  . EKG 12-Lead   Medication Changes: No orders of the defined types were placed in this encounter.   Signed, Richardson Dopp,  PA-C  04/17/2020 12:07 PM    [facility] [place], [place], [place]  [address] Phone: [phone]; Fax: [phone]

## 2020-04-17 ENCOUNTER — Ambulatory Visit (INDEPENDENT_AMBULATORY_CARE_PROVIDER_SITE_OTHER): Payer: Medicare Other | Admitting: Physician Assistant

## 2020-04-17 ENCOUNTER — Other Ambulatory Visit: Payer: Self-pay

## 2020-04-17 ENCOUNTER — Encounter: Payer: Self-pay | Admitting: Physician Assistant

## 2020-04-17 VITALS — BP 110/66 | HR 87 | Ht <= 58 in | Wt 112.0 lb

## 2020-04-17 DIAGNOSIS — I5032 Chronic diastolic (congestive) heart failure: Secondary | ICD-10-CM | POA: Diagnosis not present

## 2020-04-17 NOTE — Patient Instructions (Signed)
Medication Instructions:  Your physician recommends that you continue on your current medications as directed. Please refer to the Current Medication list given to you today.  *If you need a refill on your cardiac medications before your next appointment, please call your pharmacy*   Lab Work: NONE If you have labs (blood work) drawn today and your tests are completely normal, you will receive your results only by: Marland Kitchen MyChart Message (if you have MyChart) OR . A paper copy in the mail If you have any lab test that is abnormal or we need to change your treatment, we will call you to review the results.   Testing/Procedures: NONE   Follow-Up: At [facility], you and your health needs are our priority.  As part of our continuing mission to provide you with exceptional heart care, we have created designated Provider Care Teams.  These Care Teams include your primary Cardiologist (physician) and Advanced Practice Providers (APPs -  Physician Assistants and Nurse Practitioners) who all work together to provide you with the care you need, when you need it.  We recommend signing up for the patient portal called "MyChart".  Sign up information is provided on this After Visit Summary.  MyChart is used to connect with patients for Virtual Visits (Telemedicine).  Patients are able to view lab/test results, encounter notes, upcoming appointments, etc.  Non-urgent messages can be sent to your provider as well.   To learn more about what you can do with MyChart, go to [email or web address].    Your next appointment:   1 year(s)  The format for your next appointment:   In Person  Provider:   You may see Jenkins Rouge, MD or one of the following Advanced Practice Providers on your designated Care Team:    Truitt Merle, NP  Cecilie Kicks, NP  Kathyrn Drown, NP

## 2020-04-30 ENCOUNTER — Encounter: Payer: Self-pay | Admitting: Family Medicine

## 2020-04-30 ENCOUNTER — Other Ambulatory Visit: Payer: Self-pay

## 2020-04-30 ENCOUNTER — Ambulatory Visit (INDEPENDENT_AMBULATORY_CARE_PROVIDER_SITE_OTHER): Payer: Medicare Other | Admitting: Family Medicine

## 2020-04-30 DIAGNOSIS — R358 Other polyuria: Secondary | ICD-10-CM

## 2020-04-30 DIAGNOSIS — I872 Venous insufficiency (chronic) (peripheral): Secondary | ICD-10-CM

## 2020-04-30 DIAGNOSIS — I5033 Acute on chronic diastolic (congestive) heart failure: Secondary | ICD-10-CM | POA: Diagnosis not present

## 2020-04-30 DIAGNOSIS — G8929 Other chronic pain: Secondary | ICD-10-CM

## 2020-04-30 DIAGNOSIS — R1032 Left lower quadrant pain: Secondary | ICD-10-CM | POA: Diagnosis not present

## 2020-04-30 DIAGNOSIS — R3589 Other polyuria: Secondary | ICD-10-CM

## 2020-04-30 MED ORDER — FUROSEMIDE 20 MG PO TABS: 10.0000 mg | ORAL_TABLET | ORAL | 0 refills | Status: DC

## 2020-04-30 MED ORDER — DULOXETINE HCL 40 MG PO CPEP: 40.0000 mg | ORAL_CAPSULE | Freq: Every day | ORAL | 3 refills | Status: DC

## 2020-04-30 NOTE — Patient Instructions (Signed)
It was nice seeing you today Heather [name]!  Please take half a tablet of Lasix, which is 10 mg, every other day.  You can stop taking this medication if it does not seem to help you.  We are increasing your duloxetine to 40 mg once daily to see if this will help with your pain.  Please remember to come to your appointment with our clinic on July 1 at 230.  This is a very important appointment where you and your son can meet with several medical professionals.  If you have any questions or concerns, please feel free to call the clinic.   Be well,  Dr. Shan Levans

## 2020-04-30 NOTE — Progress Notes (Signed)
    SUBJECTIVE:   CHIEF COMPLAINT / HPI:   Chronic abdominal pain Worse on the left side, which corresponds to her description of her chronic pain in the past.  Bowel movements are regular, and she is eating well.  She denies vomiting and nausea.  She denies melena and bright red blood per rectum.  She thinks that she has a cancer in her abdomen that is causing her symptoms.  Urinary frequency Patient says that her urologist started Myrbetriq, but she continues to take furosemide, which causes her to pee more.  She says that it does not seem to affect her lower extremity edema.  She is worried about continuing to take the furosemide at the current dose because she does not want to urinate frequently.  PERTINENT  PMH / PSH: Diastolic CHF, chronic abdominal pain, urinary incontinence, cognitive decline  OBJECTIVE:   BP (!) 126/52   Pulse 78   Ht 4\' 10"  (1.473 m)   Wt 115 lb 12.8 oz (52.5 kg)   SpO2 98%   BMI 24.20 kg/m   General: well appearing, appears stated age Abdomen: No visual abnormality, normal bowel sounds, soft, but tender to palpation in the left upper and lower quadrants, which is similar to previous exams Skin: no rashes or other lesions, warm and well perfused Psych: Perseverates on abdominal pain   ASSESSMENT/PLAN:   Chronic LLQ pain No red flag symptoms today, and I am reassured that patient is eating well and has had regular her bowel movements.  Her weight appears to be stable compared to the previous visits over the past year we will increase patient's Cymbalta to 40 mg daily to see if this will help with pain control.  I reassured the patient that she has had multiple CT scans that have not shown any abnormality or signs of cancer.  We will continue to treat this symptomatically and provide reassurance to the patient due to her age and frailty.  Polyuria Since furosemide does not seem to be helping reduce her pedal edema, and her frequency of urination is  disrupting her quality of life, we will decrease her furosemide to 10 mg every other day.  We can titrate this medication up or down depending on her symptoms.   Reminded patient that she has an appointment at our geriatrics clinic on July 1 at 2:30.  I also wrote this in her AVS.  I will plan to call her son to remind him of this as well since they both need to be present.  [name], MD [place]

## 2020-05-01 ENCOUNTER — Telehealth: Payer: Self-pay | Admitting: Family Medicine

## 2020-05-01 NOTE — Assessment & Plan Note (Addendum)
No red flag symptoms today, and I am reassured that patient is eating well and has had regular her bowel movements.  Her weight appears to be stable compared to the previous visits over the past year we will increase patient's Cymbalta to 40 mg daily to see if this will help with pain control.  I reassured the patient that she has had multiple CT scans that have not shown any abnormality or signs of cancer.  We will continue to treat this symptomatically and provide reassurance to the patient due to her age and frailty.

## 2020-05-01 NOTE — Telephone Encounter (Signed)
I left a voice message with Ms. Krikorian's son, [name], regarding her appointment on July 1 at 2:30 PM at our geriatrics clinic.  I told him that it is very important that they are both present for this appointment since an interdisciplinary team will be discussing best steps for her care going forward.  I encouraged him to call our clinic if he has any questions or concerns about this appointment.

## 2020-05-01 NOTE — Assessment & Plan Note (Signed)
Since furosemide does not seem to be helping reduce her pedal edema, and her frequency of urination is disrupting her quality of life, we will decrease her furosemide to 10 mg every other day.  We can titrate this medication up or down depending on her symptoms.

## 2020-05-03 DIAGNOSIS — N390 Urinary tract infection, site not specified: Secondary | ICD-10-CM | POA: Diagnosis not present

## 2020-05-04 ENCOUNTER — Telehealth: Payer: Self-pay

## 2020-05-04 NOTE — Telephone Encounter (Signed)
A Dr. Ladoris Gene LVM on nurse line requesting PCP to give her a call back. Dr. Ladoris Gene reports she works for [employer] and had a visit at the patients home. Dr. Ladoris Gene would like to speak with PCP about some things that happed at visit. Please call her at [phone].

## 2020-05-07 ENCOUNTER — Other Ambulatory Visit: Payer: Self-pay | Admitting: Family Medicine

## 2020-05-07 DIAGNOSIS — F0390 Unspecified dementia without behavioral disturbance: Secondary | ICD-10-CM

## 2020-05-07 NOTE — Telephone Encounter (Signed)
I discussed Heather [name]'s recent condition with Dr. Ladoris Gene, the doctor who sees her through [facility].  She reports that Heather [name] called for an urgent visit last Thursday and when she arrived to evaluate her, Heather [name] was very agitated and upset and perseverating on her urination, abdominal pain, and likelihood of having cancer.  She was more agitated than normal and seemed to possibly be unsafe to herself, so Dr. Ladoris Gene stayed with her for about an hour and a half to diffuse her agitation and reassure her.  She says that they have tested her urine multiple times, and it has shown no signs of infection.  They have been in touch with her son regarding her condition, but patient is very resistant to her son being her healthcare power of attorney or the possibility of going to assisted living.  We agreed that medically, Heather [name] is very healthy, but her cognitive decline is causing her to perseverate and become agitated, and she does not respond to reassurance or remember previous conversations about these issues.  We are hoping that we can make some progress on planning for her long-term care at her geriatrics appointment on July 1.  Dr. Thurnell Garbe and I will both placed referrals for home health to see if we can get an aide to help with ADLs and to improve her agitation.  Shylo Dillenbeck C. Shan Levans, MD PGY-3, [facility] 05/07/2020 3:30 PM

## 2020-05-07 NOTE — Telephone Encounter (Signed)
Returned Dr. Hardin Negus call, but she was in with the patient, so I left my cell number for her to call me back.

## 2020-05-09 ENCOUNTER — Other Ambulatory Visit: Payer: Self-pay | Admitting: Family Medicine

## 2020-05-09 DIAGNOSIS — F0390 Unspecified dementia without behavioral disturbance: Secondary | ICD-10-CM

## 2020-05-15 DIAGNOSIS — I872 Venous insufficiency (chronic) (peripheral): Secondary | ICD-10-CM | POA: Diagnosis not present

## 2020-05-15 DIAGNOSIS — G8929 Other chronic pain: Secondary | ICD-10-CM | POA: Diagnosis not present

## 2020-05-15 DIAGNOSIS — F0390 Unspecified dementia without behavioral disturbance: Secondary | ICD-10-CM | POA: Diagnosis not present

## 2020-05-15 DIAGNOSIS — R109 Unspecified abdominal pain: Secondary | ICD-10-CM | POA: Diagnosis not present

## 2020-05-15 DIAGNOSIS — I5033 Acute on chronic diastolic (congestive) heart failure: Secondary | ICD-10-CM | POA: Diagnosis not present

## 2020-05-17 ENCOUNTER — Ambulatory Visit (INDEPENDENT_AMBULATORY_CARE_PROVIDER_SITE_OTHER): Payer: Medicare Other | Admitting: Family Medicine

## 2020-05-17 ENCOUNTER — Ambulatory Visit: Payer: Medicare Other | Attending: Family Medicine | Admitting: Physical Therapy

## 2020-05-17 ENCOUNTER — Other Ambulatory Visit: Payer: Self-pay

## 2020-05-17 VITALS — BP 130/68 | HR 90 | Ht <= 58 in | Wt 115.8 lb

## 2020-05-17 DIAGNOSIS — K219 Gastro-esophageal reflux disease without esophagitis: Secondary | ICD-10-CM

## 2020-05-17 DIAGNOSIS — M25552 Pain in left hip: Secondary | ICD-10-CM

## 2020-05-17 DIAGNOSIS — F0281 Dementia in other diseases classified elsewhere with behavioral disturbance: Secondary | ICD-10-CM

## 2020-05-17 DIAGNOSIS — M40204 Unspecified kyphosis, thoracic region: Secondary | ICD-10-CM | POA: Diagnosis not present

## 2020-05-17 DIAGNOSIS — G301 Alzheimer's disease with late onset: Secondary | ICD-10-CM

## 2020-05-17 DIAGNOSIS — M1612 Unilateral primary osteoarthritis, left hip: Secondary | ICD-10-CM

## 2020-05-17 DIAGNOSIS — F411 Generalized anxiety disorder: Secondary | ICD-10-CM

## 2020-05-17 DIAGNOSIS — J479 Bronchiectasis, uncomplicated: Secondary | ICD-10-CM | POA: Insufficient documentation

## 2020-05-17 DIAGNOSIS — R293 Abnormal posture: Secondary | ICD-10-CM | POA: Insufficient documentation

## 2020-05-17 DIAGNOSIS — M545 Low back pain, unspecified: Secondary | ICD-10-CM

## 2020-05-17 DIAGNOSIS — M5440 Lumbago with sciatica, unspecified side: Secondary | ICD-10-CM | POA: Insufficient documentation

## 2020-05-17 DIAGNOSIS — I10 Essential (primary) hypertension: Secondary | ICD-10-CM

## 2020-05-17 DIAGNOSIS — M869 Osteomyelitis, unspecified: Secondary | ICD-10-CM | POA: Insufficient documentation

## 2020-05-17 DIAGNOSIS — M810 Age-related osteoporosis without current pathological fracture: Secondary | ICD-10-CM

## 2020-05-17 MED ORDER — FAMOTIDINE 20 MG PO TABS: 20.0000 mg | ORAL_TABLET | Freq: Every day | ORAL | 3 refills | Status: DC

## 2020-05-17 NOTE — Progress Notes (Signed)
Geriatric Clinic Medication Review  Conducted with Laurey Arrow, PharmD Candidate.   Patient's son, [name] was present and involved throughout the visit.   Patient also has home health visits weekly.  She prefers to NOT start duloxetine.   Minimal use of albuterol inhaler reported.   Minimal use of alprazolam.   Endorsed use of Mucinex BID  Reports PRN use of allergy pill (levocetirizine).   Reports good adherence with blood pressure (spironolactone), heart pill (carvedilol BID), and fluid pill (fuorsemide QOD).   NOT clearly using Omeprazole.  Requested refill of famotidine (sent new Rx/ Dr. Wendy Poet).

## 2020-05-17 NOTE — Patient Instructions (Signed)
It is important for Heather [name] to take movement snacks every day 3 x a day  5 x before breakfast , lunch and dinner.  Motion in Lotion  Sit-to-Stand Exercise  The sit-to-stand exercise (also known as the chair stand or chair rise exercise) strengthens your lower body and helps you maintain or improve your mobility and independence. The goal is to do the sit-to-stand exercise without using your hands. This will be easier as you become stronger. You should always talk with your health care provider before starting any exercise program, especially if you have had recent surgery. Do the exercise exactly as told by your health care provider and adjust it as directed. It is normal to feel mild stretching, pulling, tightness, or discomfort as you do this exercise, but you should stop right away if you feel sudden pain or your pain gets worse. Do not begin doing this exercise until told by your health care provider. What the sit-to-stand exercise does The sit-to-stand exercise helps to strengthen the muscles in your thighs and the muscles in the center of your body that give you stability (core muscles). This exercise is especially helpful if:  You have had knee or hip surgery.  You have trouble getting up from a chair, out of a car, or off the toilet. How to do the sit-to-stand exercise 1. Sit toward the front edge of a sturdy chair without armrests. Your knees should be bent and your feet should be flat on the floor and shoulder-width apart. 2. Place your hands lightly on each side of the seat. Keep your back and neck as straight as possible, with your chest slightly forward. 3. Breathe in slowly. Lean forward and slightly shift your weight to the front of your feet. 4. Breathe out as you slowly stand up. Use your hands as little as possible. 5. Stand and pause for a full breath in and out. 6. Breathe in as you sit down slowly. Tighten your core and abdominal muscles to control your lowering as  much as possible. 7. Breathe out slowly. 8. Do this exercise 10-15 times. If needed, do it fewer times until you build up strength. 9. Rest for 1 minute, then do another set of 10-15 repetitions. To change the difficulty of the sit-to-stand exercise  If the exercise is too difficult, use a chair with sturdy armrests, and push off the armrests to help you come to the standing position. You can also use the armrests to help slowly lower yourself back to sitting. As this gets easier, try to use your arms less. You can also place a firm cushion or pillow on the chair to make the surface higher.  If this exercise is too easy, do not use your arms to help raise or lower yourself. You can also wear a weighted vest, use hand weights, increase your repetitions, or try a lower chair. General tips  You may feel tired when starting an exercise routine. This is normal.  You may have muscle soreness that lasts a few days. This is normal. As you get stronger, you may not feel muscle soreness.  Use smooth, steady movements.  Do not  hold your breath during strength exercises. This can cause unsafe changes in your blood pressure.  Breathe in slowly through your nose, and breathe out slowly through your mouth. Summary  Strengthening your lower body is an important step to help you move safely and independently.  The sit-to-stand exercise helps strengthen the muscles in your  thighs and core.  You should always talk with your health care provider before starting any exercise program, especially if you have had recent surgery. This information is not intended to replace advice given to you by your health care provider. Make sure you discuss any questions you have with your health care provider. Document Revised: 09/01/2018 Document Reviewed: 12/25/2016 Elsevier Patient Education  [place]: Standing - Resistance Band (Active)   Stand, feet flat. Against yellow resistance band, lift  right leg out to side. Complete _1__ sets of _10__ repetitions. Perform _1__ sessions per day.      Strengthening: Hip Extension - Resisted   With tubing around right ankle, face anchor and pull leg straight back. Repeat __10__ times per set. Do __1__ sets per session. Do ___1_ sessions per day.  INTERNAL ROTATION: Sitting - Resistance Band (Active)   Sit, feet flat. Lift right leg slightly and, against yellow resistance band, move foot outward. Complete _1__ sets of _10__ repetitions. Perform _1__ sessions per day.  EXTERNAL ROTATION: Sitting - Resistance Band (Active)   Sit, feet flat. Lift right knee and, against yellow resistance band, move foot inward toward opposite knee. Complete _1__ sets of ___ repetitions. Perform 10___ sessions per day.  Heather [name], PT Certified Exercise Expert for the Aging Adult  05/17/20 3:46 PM Phone: [phone] Fax: [phone]

## 2020-05-17 NOTE — Therapy (Signed)
[name], [name], [address] Phone: [phone]   Fax:  [phone]  Physical Therapy Evaluation  Patient Details  Name: Heather [name] MRN: [id number] Date of Birth: August 10, 1918 Referring Provider (PT): McDiarmid, Sherren Mocha MD   Encounter Date: 05/17/2020   PT End of Session - 05/17/20 1607    Visit Number 1    Number of Visits 1    Date for PT Re-Evaluation 05/17/20    PT Start Time 1500    PT Stop Time 1550    PT Time Calculation (min) 50 min           Past Medical History:  Diagnosis Date  . Abdominal pain, left lower quadrant 06/22/2013  . Abnormal CT scan of lung 07/16/2015  . Allergic rhinitis, cause unspecified 02/21/2014  . Anxiety state, unspecified   . Breast cancer ([place])    NO STICK OR BLOOD PRESSURE CHECKS IN LEFT ARM  . Carpal tunnel syndrome   . Carpal tunnel syndrome on both sides 10/03/2008   Qualifier: Diagnosis of  By: Lenna Gilford MD, Deborra Medina   . Cervical spondylosis   . Chronic pain syndrome 03/03/2019  . Colon polyp 12/19/2009   Transverse-polypoid colorectal mucosa  . Constipation   . Cough 10/21/2014  . Degenerative joint disease   . Diastolic dysfunction   . Diverticula of colon   . Diverticulosis of colon 05/24/2002   Qualifier: Diagnosis of  By: Jerral Ralph    . Gastritis, chronic   . Hiatal hernia   . Hypertension   . Low back pain   . LPRD (laryngopharyngeal reflux disease) 04/04/2014  . RBBB (right bundle branch block with left anterior fascicular block)   . Renal cyst   . Venous insufficiency     Past Surgical History:  Procedure Laterality Date  . CARPAL TUNNEL RELEASE    . Left mastectomy      There were no vitals filed for this visit.        [facility] PT Assessment - 05/17/20 0001      Assessment   Medical Diagnosis low back pain, LT hip pain    Referring Provider (PT) McDiarmid, Todd MD    Hand Dominance Right      Balance Screen   Has the  patient fallen in the past 6 months No   CNA said she stumbled back to toilet   Has the patient had a decrease in activity level because of a fear of falling?  No    Is the patient reluctant to leave their home because of a fear of falling?  No      Posture/Postural Control   Posture/Postural Control Postural limitations    Postural Limitations Rounded Shoulders;Forward head;Increased thoracic kyphosis;Posterior pelvic tilt    Posture Comments pt sits with c curve on sacrum in chair      ROM / Strength   AROM / PROM / Strength AROM      AROM   Overall AROM  Deficits    Right Hip Flexion 70   standing   Left Hip Flexion 70    Lumbar Flexion 60 % limited    Lumbar Extension 100% limited   kyphosis   Lumbar - Right Side Bend 50% limited    Lumbar - Left Side Bend 50 % limited    Lumbar - Right Rotation 50% limited    Lumbar - Left Rotation 50% limited      Strength   Overall Strength Deficits  Right Hip Flexion 4-/5    Right Hip Extension 4-/5    Right Hip ABduction 4-/5    Left Hip Flexion 4-/5    Left Hip Extension 4-/5    Left Hip ABduction 4-/5      Palpation   Palpation comment tenderness over posterior of LT hip,  grossly tender over low back lumbar /thoracic paraspinals      Ambulation/Gait   Assistive device Rollator    Ambulation Surface Level    Gait velocity 1.14 ft/sec             [facility] Pre-Surgical Assessment - 05/17/20 0001    5 Meter Walk Test- trial 1 14.4 sec    5 Meter Walk Test- trial 2 14 sec.     5 Meter Walk Test- trial 3 14.6 sec.    5 meter walk test average 14.33 sec    4 Stage Balance Test tolerated for:  10 sec.    4 Stage Balance Test Position 1    comment SPPB 6/12    Sit To Stand Test- trial 1 11 sec.    Comment less than 13.0  not fall risk                    Objective measurements completed on examination: See above findings.     There EXercises Standing hip abd/ext, sitting hip abd, IR /ER all with red t band,   Sitting hip abd with t band around knees.  Wall slides  And wall slides with hip extension.  Sit to stand at sink/counter Movement snacks throughout day.          PT Education - 05/17/20 1546    Education Details POC, Explanation of findings.  HEP for home    Person(s) Educated Patient;Child(ren)    Methods Explanation;Demonstration;Tactile cues;Verbal cues;Handout    Comprehension Verbalized understanding;Returned demonstration             Clinical Impresssion Statement ;Pt is a 86yo female [facility] /back and falls evaluation. Pt presents with onset of dementia and history of back/hip a couple of years. Pt lives alone but is has son that spends the night with her.  Symptoms are limiting her comfort in sitting.  Pt was initially refusing PT but was able to participate and move hip and walk and reported feeling better. Pain in LT hip reported 10/10 and back 10/10. Pt presents with decreased back and hip ROM and strength grossly 4-/5 , 4 stage balance test at side by side for 10 sec.  High fall risk 4 stage balance test.  1.14 ft/sec walking speed. Pt performed 5xSTS in 11.0 seconds which is indicative of lower fall risk.     Based on Short Physical Performance Battery, pt has a frailty rating of 6/12 with </= 5/12 considered frail and a score of </= 10/12 indicates one or more mobility limitations.   Pt refuses PT at this time but was amenable to a home exercise program shared and demo with son and pt.  Pt /son was told that if [name] ever wants to have OPPT for back/hip, she can ask MD.    Patient was evaluated by Physical Therapist today during [facility]. Screening questions and medication/history review were completed by the medical team. Please see MD encounter for full assessment.    Marland Kitchengeri        Patient will benefit from skilled therapeutic intervention in order to improve the  following deficits and  impairments:     Visit Diagnosis: Pain in left hip  Bilateral low back pain with sciatica, sciatica laterality unspecified, unspecified chronicity  Abnormal posture     Problem List Patient Active Problem List   Diagnosis Date Noted  . Osteitis pubis ([place]) 05/17/2020  . Bronchiectasis ([place]) 05/17/2020  . Kyphosis of thoracic region 05/17/2020  . SI (sacroiliac) joint inflammation ([place]) 10/04/2019  . DDD (degenerative disc disease), lumbar 10/04/2019  . Hearing decreased, bilateral 06/30/2019  . Chronic pain syndrome 03/03/2019  . Unilateral primary osteoarthritis, right knee 01/13/2019  . Medication management 02/28/2018  . Urinary incontinence without sensory awareness 12/02/2017  . Arthritis of left hip 10/20/2017  . Functional abdominal pain syndrome 09/02/2017  . Encounter for chronic pain management 07/22/2017  . Dementia ([place]) 06/25/2017  . Vitamin D deficiency 05/21/2017  . Chronic LLQ pain 11/23/2014  . Esophageal dysmotility 10/27/2014  . Arthritis of hand, degenerative 04/10/2014  . Other and unspecified hyperlipidemia 04/10/2014  . Chronic bronchitis ([place]) 05/30/2013  . Hx of breast cancer 08/30/2012  . Venous insufficiency of both lower extremities 06/12/2011  . GERD 03/27/2010  . Diastolic CHF ([place]) 20/60/1561  . Constipation 01/18/2009  . Cervical spondylosis without myelopathy 03/21/2008  . Diaphragmatic hernia 12/26/2007  . Anxiety state 09/15/2007  . Primary hypertension 09/15/2007  . Venous (peripheral) insufficiency 09/15/2007    Voncille Lo, PT Certified Exercise Expert for the Aging Adult  05/17/20 4:20 PM Phone: [phone] Fax: [facility] [address] [place], [name], [address] Phone: [phone]   Fax:  [phone]  Name: Heather [name] MRN: [id number] Date of Birth: September 18, 1918

## 2020-05-17 NOTE — Progress Notes (Addendum)
[facility]:   Patient is accompanied by: son, Deon Ivey Primary caregiver: son, Denim Start Patient's lives alone. Patient information was obtained from patient, relative(s) and past medical records. History/Exam limitations: dementia. Primary Care Provider: Alcus Dad, MD Referring provider: Maia Breslow, MD Reason for referral:  Chief Complaint  Patient presents with  . geriatric assessment  . Dementia   Previous Report Reviewed: historical medical records, lab reports and radiology reports.   Patient's Care Team Patient Care Team: Garald Balding, MD as Attending Physician (Orthopedic Surgery) Debbra Riding, MD as Consulting Physician (Ophthalmology) Noralee Space, MD as Consulting Physician (Pulmonary Disease)  ----------------------------------------------------------------------------------------------------------------------------------------------------------------------------------------------------------------------------------------------------------------   HPI by problems:  Chief Complaint  Patient presents with  . geriatric assessment  . Dementia       Behavioral and Psychological Symptoms of Dementia (relevant or irrelevant): Relevant Dr Maudie Flakes telephone call with Ms Simms' home visiting physician, Dr Ladoris Gene [facility]) documented an urgently requested visit by  Dr Ladoris Gene for Ms [name].  Dr Ladoris Gene found the patient in a "very agitated and upset and perservating on her urination, abdominal pain and likelihood of having cancer.  Dr Ladoris Gene stay of a while to help calm Ms Puerto down.     Denman George, Ms Edmundson' son and only child, described his mother is "very stubborn' and suspicious of signing documents, like the healthcare power of attorney.  Ms Sarr was suspicious of signing the form giving our physical therapist to evaluate her during this [facility] visit.   Mr Dimaio  would like custodial care assistance for his mother, but the cost is prohibitive and she does not meet Medicaid financial criteria for enrollment.   He does stay with his mother 5 out of 7 nights a week.    Mr Oliva is managing his mothers finances by helping her pay her bills, but she will not sign paperwork to give him either durable power of attorney nor healthcare power of attorney.   If relevant, address whether these symptoms are present:             Irritability/Agitation/Aggression:                     Does the patient become impatient, cranky, have difficulty waiting?  yes                    Does the patient resist care like bathing, dressing, eating/feeding? no                    Does the patient strike others? no                    3.   Depression/Dysphoria:                      Does the patient appear sad, tearful, withdrawn, disinterested? no                     Has patient lost interest in eating?  no.  Is the patient losing or gaining weight?  no 4.   Delusions:                      Does the patient believe that others are stealing from them or planning to hurt them in some way? no, thought she is suspicious of others working to moving her from her home into a LTC facility.  5.   Hallucinations:                     Does the patient hearing voices or does he/she talk to people who are not there? no                    Does the patient seeing things that others do not see? no 6.   Motor disturbances:                     Does the patient do the same thing over and over, like taking clothes out of drawers, pacing, or yelling repeatedly  yes, episodic when she becomes agitated                    Has patient gotten lost? no 7.   Disinhibition:                     Does the patient seem to act impulsively, for example, talking to strangers as if he/she knows them? no                    Is the patient verbally abusive to others? no 8.   Nighttime issues:                     Is the  patient frequently awakening at night or getting up too early? yes                    Does the patient wander about home at night? yes                    Has the patient wandered outside? no    Outpatient Encounter Medications as of 05/17/2020  Medication Sig  . ALPRAZolam (XANAX) 0.25 MG tablet Take 1 tablet by mouth twice daily as needed for anxiety  . carvedilol (COREG) 3.125 MG tablet Take 1 tablet by mouth twice daily  . cholecalciferol (VITAMIN D) 1000 units tablet Take 1,000 Units by mouth daily.  . famotidine (PEPCID) 20 MG tablet Take 1 tablet (20 mg total) by mouth daily.  . feeding supplement, ENSURE ENLIVE, (ENSURE ENLIVE) LIQD Take 237 mLs by mouth 2 (two) times daily between meals.  . furosemide (LASIX) 20 MG tablet Take 0.5 tablets (10 mg total) by mouth every other day.  Marland Kitchen guaiFENesin (ROBITUSSIN) 100 MG/5ML liquid Take 200 mg by mouth 3 (three) times daily as needed for cough. Taking PRN  . levocetirizine (XYZAL) 5 MG tablet Take 5 mg by mouth as needed.   . Multiple Vitamins-Minerals (MULTIVITAMINS THER. W/MINERALS) TABS Take 1 tablet by mouth daily.  . polyethylene glycol powder (GLYCOLAX/MIRALAX) powder Take 17 g by mouth daily as needed.  Marland Kitchen spironolactone (ALDACTONE) 25 MG tablet Take 1 tablet (25 mg total) by mouth daily.  Marland Kitchen tobramycin (TOBREX) 0.3 % ophthalmic solution   . [DISCONTINUED] famotidine (PEPCID) 20 MG tablet Take 1 tablet (20 mg total) by mouth daily.  . [DISCONTINUED] omeprazole (PRILOSEC) 20 MG capsule Take 20 mg by mouth daily.  Marland Kitchen albuterol (VENTOLIN HFA) 108 (90 Base) MCG/ACT inhaler Inhale 2 puffs into the lungs every 6 (six) hours as needed for wheezing or shortness of breath. (Patient not taking: Reported on 05/17/2020)  . [DISCONTINUED] DULoxetine 40 MG CPEP Take 40 mg by mouth daily. (Patient not taking: Reported on 05/17/2020)   No facility-administered encounter medications  on file as of 05/17/2020.    History Patient Active Problem List   Diagnosis  Date Noted  . 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 Bronchiectasis ([place]) 05/17/2020    Priority: High  . Dementia with behavioral disturbance 06/25/2017    Priority: High  . Chronic bronchitis ([place]) 05/30/2013    Priority: High  . Diastolic CHF ([place]) 10/93/2355    Priority: High  . Anxiety state 09/15/2007    Priority: High  . Osteoporosis 05/22/2020    Priority: Medium  . Chronic pain syndrome 03/03/2019    Priority: Medium  . Chronic LLQ pain 11/23/2014    Priority: Medium  . GERD without esophagitis 03/27/2010    Priority: Medium  . Primary hypertension 09/15/2007    Priority: Medium  . Kyphosis of thoracic region 05/17/2020    Priority: Low  . DDD (degenerative disc disease), lumbar 10/04/2019    Priority: Low  . Hearing decreased, bilateral 06/30/2019    Priority: Low  . Urinary incontinence without sensory awareness 12/02/2017    Priority: Low  . Arthritis of left hip 10/20/2017    Priority: Low  . Functional abdominal pain syndrome 09/02/2017    Priority: Low  . Vitamin D deficiency 05/21/2017    Priority: Low  . Esophageal dysmotility 10/27/2014    Priority: Low  . Arthritis of hand, degenerative 04/10/2014    Priority: Low  . Other and unspecified hyperlipidemia 04/10/2014    Priority: Low  . Hx of breast cancer 08/30/2012    Priority: Low  . Venous insufficiency of both lower extremities 06/12/2011    Priority: Low  . Constipation 01/18/2009    Priority: Low  . Cervical spondylosis without myelopathy 03/21/2008    Priority: Low  . SI (sacroiliac) joint inflammation ([place]) 10/04/2019  . Unilateral primary osteoarthritis, right knee 01/13/2019  . Medication management 02/28/2018  . Encounter for chronic pain management 07/22/2017  . Diaphragmatic hernia 12/26/2007  . Venous (peripheral) insufficiency 09/15/2007   Past Medical History:  Diagnosis Date  . Abdominal pain, left lower quadrant 06/22/2013  . Abnormal CT scan of  lung 07/16/2015  . Allergic rhinitis, cause unspecified 02/21/2014  . Anxiety state, unspecified   . Breast cancer ([place])   . Carpal tunnel syndrome on both sides 10/03/2008   Qualifier: Diagnosis of  By: Lenna Gilford MD, Deborra Medina   . Cervical spondylosis   . Chronic pain syndrome 03/03/2019  . Colon polyp 12/19/2009   Transverse-polypoid colorectal mucosa  . Constipation   . Cough 10/21/2014  . Degenerative joint disease   . Diastolic dysfunction   . Diverticula of colon   . Diverticulosis of colon 05/24/2002  . Gastritis, chronic   . Hiatal hernia   . Hypertension   . Low back pain   . LPRD (laryngopharyngeal reflux disease) 04/04/2014  . RBBB (right bundle branch block with left anterior fascicular block)   . Renal cyst   . Venous insufficiency    Past Surgical History:  Procedure Laterality Date  . CARPAL TUNNEL RELEASE    . Left mastectomy     Family History  Problem Relation Age of Onset  . Colon cancer Neg Hx   . Pancreatic cancer Neg Hx   . Esophageal cancer Neg Hx   . Stomach cancer Neg Hx    Social History   Socioeconomic History  . Marital status: Widowed    Spouse name: Not on file  . Number of children: 1  .  Years of education: Not on file  . Highest education level: Not on file  Occupational History  . Occupation: Retired    Fish farm manager: RETIRED  . Occupation: Risk manager  Tobacco Use  . Smoking status: Never Smoker  . Smokeless tobacco: Never Used  Vaping Use  . Vaping Use: Never used  Substance and Sexual Activity  . Alcohol use: No    Alcohol/week: 0.0 standard drinks  . Drug use: No  . Sexual activity: Not on file  Other Topics Concern  . 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 Silvano Rusk, RN, BSN        Casimer Lanius, LCSW   Clinical Social Worker   Updated 12/29/2019                                                                                    Social Determinants of Health   Financial Resource Strain:   . Difficulty of Paying Living Expenses:   Food Insecurity:   . Worried About Charity fundraiser in the Last Year:   . Arboriculturist in the Last Year:   Transportation Needs:   . Film/video editor (Medical):   Marland Kitchen Lack of Transportation (Non-Medical):   Physical Activity:   . Days of Exercise per Week:   . Minutes of Exercise per Session:   Stress:   . Feeling of Stress :   Social Connections:   . Frequency of Communication with Friends and Family:   . Frequency of Social Gatherings with Friends and Family:   . Attends Religious Services:   . Active Member of Clubs or Organizations:   . Attends Archivist Meetings:   Marland Kitchen Marital Status:      Basic Activities of Daily Living  Dressing: Self-care Eating: Self-care Ambulation: Self-care Toileting: Self-care Bathing: Self-care  Instrumental Activities of Daily Living Shopping: Total assistance House/Yard Work: Education officer, environmental of medications: Self-care Finances: Partial assistance Telephone: Self-care Transportation: Total assistance   Caregivers in home: Son lives nearby and checks on his mother several times a day.  Caregiver Stress Self-Assessment (Zarit Score):  27 out of 80 Scoring: 0-20 = Little/No stress       21-40 = Mild/Moderate stress       41-60 = Moderate/Severe  stress      61-80 = Severe stress   Formal Home Health Assistance  Physical Therapy: no  Occupational Therapy: no             Home Aid / Personal Care Service: no             Homemaker services: no  FALLS in last five office visits:  Fall Risk  05/17/2020 04/30/2020 02/22/2020 02/03/2020 01/12/2020  Falls in the past year? 1 0 0 0 0  Number falls in past yr: 0 - 0 0 -  Injury with Fall? 0 - 0 0 -  Risk for fall due to : - - - - -  Follow up - - - - -    Health Maintenance reviewed: Immunization History  Administered Date(s) Administered  . Influenza,inj,Quad PF,6+ Mos 09/18/2015   Health Maintenance Topics with due status: Overdue     Topic Date Due   COVID-19 Vaccine Never done   TETANUS/TDAP Never done   PNA vac Low Risk Adult Never done    Diet: Regular Nutritional supplements: none  Geriatric Syndromes: Constipation yes  Laxative use:yes  Miralax Incontinence yes  Nocturia: yes Dizziness no   Syncope no  Balance impairment:yes    Skin problems no   Visual Impairment no 20/40 OU   Hearing impairment yes Dentures problems: no Eating impairment no  Impaired Memory or Cognition yes   Behavioral problems yes   Sleep problems yes   Weight loss no Drug Misadventure: no   Joint pain: yes Joint stiffness: yes Osteoporosis: yes Pressure Ulcers: no Immobility: no Ankle edema: yes History of UTIs: no   Vital Signs Weight: 115 lb 12.8 oz (52.5 kg) Body mass index is 24.2 kg/m. CrCl cannot be calculated (Patient's most recent lab result is older than the maximum 21 days allowed.). Body surface area is 1.47 meters squared. Vitals:   05/17/20 1441  BP: 130/68  Pulse: 90  SpO2: 96%  Weight: 115 lb 12.8 oz (52.5 kg)  Height: 4\' 10"  (1.473 m)   Wt Readings from Last 3 Encounters:  05/17/20 115 lb 12.8 oz (52.5 kg)  04/30/20 115 lb 12.8 oz (52.5 kg)  04/17/20 112 lb (50.8 kg)    Hearing Screening   Method: Audiometry   125Hz  250Hz  500Hz  1000Hz  2000Hz  3000Hz   4000Hz  6000Hz  8000Hz   Right ear:   Fail Fail Fail  Fail    Left ear:   Fail Fail Fail  Fail      Visual Acuity Screening   Right eye Left eye Both eyes  Without correction: 20/40 20/40 20/40   With correction:       Physical Examination:  VS reviewed GEN: Alert, Cooperative, Groomed, NAD HEENT: PERRL; EAC bilaterally not occluded, TM's translucent with normal LM, (+) LR;                No cervical LAN, No thyromegaly, No palpable masses COR: RRR, No M/G/R, No JVD, Normal PMI size and location LUNGS: BCTA, No Acc mm use, speaking in full sentences  See Physical Therapy evaluation  No flowsheet data found.  MMSE - Mini Mental State Exam 09/30/2016  Orientation to time 5  Orientation to Place 5  Registration 3  Attention/ Calculation 0  Recall 0  Language- name 2 objects 2  Language- repeat 1  Language- follow 3 step command 3  Language- read & follow direction 1  Write a sentence 1  Copy design 1  Total score 22           Labs     Chemistry      Component Value Date/Time  NA 140 02/17/2020 1700   NA 142 06/21/2018 1628   K 4.0 02/17/2020 1700   CL 103 02/17/2020 1700   CO2 26 02/17/2020 1700   BUN 21 02/17/2020 1700   BUN 23 06/21/2018 1628   CREATININE 1.15 (H) 02/17/2020 1700      Component Value Date/Time   CALCIUM 9.2 02/17/2020 1700   ALKPHOS 35 (L) 02/17/2020 1700   AST 27 02/17/2020 1700   ALT 21 02/17/2020 1700   BILITOT 0.8 02/17/2020 1700       CrCl cannot be calculated (Patient's most recent lab result is older than the maximum 21 days allowed.).   No results found for: HGBA1C   @10RELATIVEDAYS @  Hearing Screening   Method: Audiometry   125Hz  250Hz  500Hz  1000Hz  2000Hz  3000Hz  4000Hz  6000Hz  8000Hz   Right ear:   Fail Fail Fail  Fail    Left ear:   Fail Fail Fail  Fail      Visual Acuity Screening   Right eye Left eye Both eyes  Without correction: 20/40 20/40 20/40   With correction:      Lab Results  Component Value Date   WBC  5.2 02/17/2020   HGB 13.2 02/17/2020   HCT 41.1 02/17/2020   MCV 96.0 02/17/2020   PLT 195 02/17/2020    No results found for this or any previous visit (from the past 24 hour(s)).  Personal Strengths Capable of independent living Supportive family/friends  Support System Strengths Supportive Relationships   Advanced Directives Code Status: Full Advance Directives: None Full code   ------------------------------------------------------------------------------------------------------------------------------------------------------------------------------------------------------------------------------------------------------------------------------------------------------------------------------------------------------------------------------------------------------------------------------------------------------------------------------------------------------------  Assessment and Plan: Please see individual consultation notes from physical therapy, pharmacy and social work for today.    Problem List Items Addressed This Visit      High   Anxiety state    Currently quiescent Episode of agitation and perseveration about three weeks ago that responded to time and reassurance from home visiting physician.   Mood of suspiciousness, particularly around issues of control of her property and self.  While there is a prescription for Xanax, it is a very low dose and used infrequently.  She self doses it when she is very anxious.   It seems that Ms Freiman can respond to non-pharmacological measures when she has exacerbation of her anxious mood.  It sounds as though her son, Enriqueta Augusta, has a good grasp of how to cope with her mother's behavioral and psychological symptoms of dementia.  Support and education for Mr Eatherly when and if needed, is appropriate.  He is aware of the [employer] as a resource locally.        Dementia with behavioral disturbance - Primary     FAST Stage: 5 memory loss decreased attention and decreased safety awareness - has left pot on stove to burn Progression since last assessment: increase in needs for iADLs but still within the mild stage of dementia Medication or substances that may impair patient's cognition: Xanax, but very low dose and infrequent use Behavioral and Psychological Symptoms of Dementia: Irritability and episodes of agitation that repond to presence of another giving reassurance.  Mood of suspiciousness, particularly around property and control of her living situation.  Others may be working towards her movement to a LTC facility.  Supportive Interventions for caregiver (support group or individual counseling): FMLA form for Mr Larivee certifying that his mother has a permanent serious health conditionl, Dementia-probably Alzheimer Type, for which he may need to provide episodes of care.    Physical Activity recommendation: See  PT recommendations Financial, legal and medical advanced planning recommended to patient and caregivers: Ms Merilyn Baba offered to facilitate signing of [facility] POA document, but patient declined.    Follow up surveillance appointment in 3 to 6 months: 3 months with PCP.          Medium   GERD without esophagitis    Established problem Controlled Stop omeprazole Start famotidine        Relevant Medications   famotidine (PEPCID) 20 MG tablet   Osteoporosis    Established problem DEXA confirmed Needs calcium supplementation         Primary hypertension    Established problem Controlled Continue current medications and other regiments         Low   Arthritis of left hip    Established problem Limited ROM on PT evaluation PT provided instruction in exercise for hip  After exercise in office, hip pain decreased       Other Visit Diagnoses    Left hip pain       Low back pain, unspecified back pain laterality, unspecified chronicity, unspecified whether sciatica  present         Anxiety state Currently quiescent Episode of agitation and perseveration about three weeks ago that responded to time and reassurance from home visiting physician.   Mood of suspiciousness, particularly around issues of control of her property and self.  While there is a prescription for Xanax, it is a very low dose and used infrequently.  She self doses it when she is very anxious.   It seems that Ms Swoboda can respond to non-pharmacological measures when she has exacerbation of her anxious mood.  It sounds as though her son, Raneshia Derick, has a good grasp of how to cope with her mother's behavioral and psychological symptoms of dementia.  Support and education for Mr Cahue when and if needed, is appropriate.  He is aware of the [employer] as a resource locally.    Dementia with behavioral disturbance FAST Stage: 5 memory loss decreased attention and decreased safety awareness - has left pot on stove to burn Progression since last assessment: increase in needs for iADLs but still within the mild stage of dementia Medication or substances that may impair patient's cognition: Xanax, but very low dose and infrequent use Behavioral and Psychological Symptoms of Dementia: Irritability and episodes of agitation that repond to presence of another giving reassurance.  Mood of suspiciousness, particularly around property and control of her living situation.  Others may be working towards her movement to a LTC facility.  Supportive Interventions for caregiver (support group or individual counseling): FMLA form for Mr Claytor certifying that his mother has a permanent serious health conditionl, Dementia-probably Alzheimer Type, for which he may need to provide episodes of care.    Physical Activity recommendation: See PT recommendations Financial, legal and medical advanced planning recommended to patient and caregivers: Ms Merilyn Baba offered to facilitate signing of [facility]  POA document, but patient declined.    Follow up surveillance appointment in 3 to 6 months: 3 months with PCP.    GERD without esophagitis Established problem Controlled Stop omeprazole Start famotidine    Osteoporosis Established problem DEXA confirmed Needs calcium supplementation     Primary hypertension Established problem Controlled Continue current medications and other regiments   Arthritis of left hip Established problem Limited ROM on PT evaluation PT provided instruction in exercise for hip  After exercise in office, hip pain decreased  PHYSICAL THERAPY assessment and plan A physical therapy screen was completed in today's geriatric interdisciplinary assessment clinic visit.  The patient was identified at a high fall risk.  Home exercises provided and demonstrated by PT.   Primary Contact: Denman George    > 60 minutes face to face were spent in total with interdisciplinary discussion, patient and caretaker counseling and coordination of care took more than 20 minutes. The Geriatric interdisciplinary team meet to discuss the patient's assessment, problem list, and recommendations.  The interdisciplinary team consisted of representatives from medicine, pharmacy, physical therapy and social work. The interdisciplinary team meet with the patient and caretakers to review the team's findings, assessments, and recommendations.

## 2020-05-22 ENCOUNTER — Encounter: Payer: Self-pay | Admitting: Family Medicine

## 2020-05-22 ENCOUNTER — Telehealth: Payer: Self-pay | Admitting: *Deleted

## 2020-05-22 DIAGNOSIS — M810 Age-related osteoporosis without current pathological fracture: Secondary | ICD-10-CM

## 2020-05-22 HISTORY — DX: Age-related osteoporosis without current pathological fracture: M81.0

## 2020-05-22 NOTE — Assessment & Plan Note (Signed)
FAST Stage: 5 memory loss decreased attention and decreased safety awareness - has left pot on stove to burn Progression since last assessment: increase in needs for iADLs but still within the mild stage of dementia Medication or substances that may impair patient's cognition: Xanax, but very low dose and infrequent use Behavioral and Psychological Symptoms of Dementia: Irritability and episodes of agitation that repond to presence of another giving reassurance.  Mood of suspiciousness, particularly around property and control of her living situation.  Others may be working towards her movement to a LTC facility.  Supportive Interventions for caregiver (support group or individual counseling): FMLA form for Mr Minardi certifying that his mother has a permanent serious health conditionl, Dementia-probably Alzheimer Type, for which he may need to provide episodes of care.    Physical Activity recommendation: See PT recommendations Financial, legal and medical advanced planning recommended to patient and caregivers: Ms Merilyn Baba offered to facilitate signing of [facility] POA document, but patient declined.    Follow up surveillance appointment in 3 to 6 months: 3 months with PCP.

## 2020-05-22 NOTE — Assessment & Plan Note (Signed)
Currently quiescent Episode of agitation and perseveration about three weeks ago that responded to time and reassurance from home visiting physician.   Mood of suspiciousness, particularly around issues of control of her property and self.  While there is a prescription for Xanax, it is a very low dose and used infrequently.  She self doses it when she is very anxious.   It seems that Heather [name] can respond to non-pharmacological measures when she has exacerbation of her anxious mood.  It sounds as though her son, Heather [name], has a good grasp of how to cope with her mother's behavioral and psychological symptoms of dementia.  Support and education for Mr Wooton when and if needed, is appropriate.  He is aware of the [employer] as a resource locally.

## 2020-05-22 NOTE — Assessment & Plan Note (Signed)
Established problem Limited ROM on PT evaluation PT provided instruction in exercise for hip  After exercise in office, hip pain decreased

## 2020-05-22 NOTE — Assessment & Plan Note (Addendum)
Established problem DEXA confirmed Needs calcium supplementation

## 2020-05-22 NOTE — Telephone Encounter (Signed)
LM for son letting him that FMLA paperwork is ready for pick up. Daved Mcfann,CMA

## 2020-05-22 NOTE — Telephone Encounter (Signed)
-----   Message from [place], MD sent at 05/22/2020 10:10 AM EDT ----- Regarding: Pick up FMLA form Please call Ms Crenshaw' son, Charlot Gouin, at [phone] to ask him to pick up his [place] certification form.  Please let him know there are portions of the form he will need to complete and sign before giving it to his employer.   Thank you  Sherren Mocha

## 2020-05-22 NOTE — Assessment & Plan Note (Signed)
Established problem Controlled Stop omeprazole Start famotidine

## 2020-05-22 NOTE — Addendum Note (Signed)
Addended byWendy Poet, Nikie Cid D on: 05/22/2020 12:09 PM   Modules accepted: Orders

## 2020-05-22 NOTE — Addendum Note (Signed)
Addended byWendy Poet, Tasheika Kitzmiller D on: 05/22/2020 09:52 AM   Modules accepted: Orders

## 2020-05-22 NOTE — Assessment & Plan Note (Signed)
Established problem Controlled Continue current medications and other regiments  

## 2020-06-02 DIAGNOSIS — R350 Frequency of micturition: Secondary | ICD-10-CM | POA: Diagnosis not present

## 2020-06-29 ENCOUNTER — Other Ambulatory Visit: Payer: Self-pay | Admitting: *Deleted

## 2020-06-29 DIAGNOSIS — F411 Generalized anxiety disorder: Secondary | ICD-10-CM

## 2020-06-29 DIAGNOSIS — I10 Essential (primary) hypertension: Secondary | ICD-10-CM

## 2020-07-02 MED ORDER — ALPRAZOLAM 0.25 MG PO TABS: 0.2500 mg | ORAL_TABLET | Freq: Two times a day (BID) | ORAL | 0 refills | Status: DC | PRN

## 2020-07-02 MED ORDER — CARVEDILOL 3.125 MG PO TABS: 3.1250 mg | ORAL_TABLET | Freq: Two times a day (BID) | ORAL | 0 refills | Status: DC

## 2020-07-06 ENCOUNTER — Telehealth: Payer: Self-pay | Admitting: *Deleted

## 2020-07-06 NOTE — Telephone Encounter (Signed)
FYI: RN case Freight forwarder for [employer] works with patient and is checking on a previous referral for home health.  I advised of her the status from June from what I could see but patient was never established with a company.  She was previously under the care of [facility] but it has been a few years.  Informed Verdene Lennert that I would reach out to a few other places to see if her insurance is accepted and if we can get her established.  Voiced understanding and will wait to hear back from me with an update.  Romie Keeble,CMA

## 2020-07-09 ENCOUNTER — Other Ambulatory Visit: Payer: Self-pay | Admitting: Family Medicine

## 2020-07-09 DIAGNOSIS — F0391 Unspecified dementia with behavioral disturbance: Secondary | ICD-10-CM

## 2020-07-12 ENCOUNTER — Other Ambulatory Visit: Payer: Self-pay

## 2020-07-12 DIAGNOSIS — I872 Venous insufficiency (chronic) (peripheral): Secondary | ICD-10-CM

## 2020-07-12 DIAGNOSIS — I5033 Acute on chronic diastolic (congestive) heart failure: Secondary | ICD-10-CM

## 2020-07-12 MED ORDER — FUROSEMIDE 20 MG PO TABS: 10.0000 mg | ORAL_TABLET | ORAL | 0 refills | Status: DC

## 2020-07-13 DIAGNOSIS — I872 Venous insufficiency (chronic) (peripheral): Secondary | ICD-10-CM | POA: Diagnosis not present

## 2020-07-13 DIAGNOSIS — M810 Age-related osteoporosis without current pathological fracture: Secondary | ICD-10-CM | POA: Diagnosis not present

## 2020-07-13 DIAGNOSIS — F0391 Unspecified dementia with behavioral disturbance: Secondary | ICD-10-CM | POA: Diagnosis not present

## 2020-07-13 DIAGNOSIS — M47812 Spondylosis without myelopathy or radiculopathy, cervical region: Secondary | ICD-10-CM | POA: Diagnosis not present

## 2020-07-13 DIAGNOSIS — I5030 Unspecified diastolic (congestive) heart failure: Secondary | ICD-10-CM | POA: Diagnosis not present

## 2020-07-13 DIAGNOSIS — M19049 Primary osteoarthritis, unspecified hand: Secondary | ICD-10-CM | POA: Diagnosis not present

## 2020-07-13 DIAGNOSIS — J479 Bronchiectasis, uncomplicated: Secondary | ICD-10-CM | POA: Diagnosis not present

## 2020-07-13 DIAGNOSIS — I110 Hypertensive heart disease with heart failure: Secondary | ICD-10-CM | POA: Diagnosis not present

## 2020-07-13 DIAGNOSIS — L89892 Pressure ulcer of other site, stage 2: Secondary | ICD-10-CM | POA: Diagnosis not present

## 2020-07-13 DIAGNOSIS — F419 Anxiety disorder, unspecified: Secondary | ICD-10-CM | POA: Diagnosis not present

## 2020-07-13 DIAGNOSIS — K5790 Diverticulosis of intestine, part unspecified, without perforation or abscess without bleeding: Secondary | ICD-10-CM | POA: Diagnosis not present

## 2020-07-13 DIAGNOSIS — M40204 Unspecified kyphosis, thoracic region: Secondary | ICD-10-CM | POA: Diagnosis not present

## 2020-07-13 DIAGNOSIS — M1612 Unilateral primary osteoarthritis, left hip: Secondary | ICD-10-CM | POA: Diagnosis not present

## 2020-07-13 DIAGNOSIS — M5116 Intervertebral disc disorders with radiculopathy, lumbar region: Secondary | ICD-10-CM | POA: Diagnosis not present

## 2020-07-13 DIAGNOSIS — G894 Chronic pain syndrome: Secondary | ICD-10-CM | POA: Diagnosis not present

## 2020-07-13 DIAGNOSIS — M1711 Unilateral primary osteoarthritis, right knee: Secondary | ICD-10-CM | POA: Diagnosis not present

## 2020-07-16 ENCOUNTER — Telehealth: Payer: Self-pay

## 2020-07-16 NOTE — Telephone Encounter (Signed)
Anne Ng, [facility] RN, calls nurse line requesting verbal orders for home health nursing as follows:  1x a week for 1 week  2x a week for 2 weeks 1x a week for 7 weeks  Blood pressure checks and wound care for stage 2 pressure ulcer on bottom.   Verbal order given per [facility] protocol.

## 2020-07-25 ENCOUNTER — Ambulatory Visit: Payer: Medicare Other | Admitting: Physician Assistant

## 2020-07-25 NOTE — Progress Notes (Deleted)
Cardiology Office Note:    Date:  07/25/2020   ID:  Heather [name], DOB [birth year], MRN [id number]  PCP:  Heather Dad, MD  [facility] Cardiologist:  Jenkins Rouge, MD *** [facility] Electrophysiologist:  None   Referring MD: Heather Dad, MD   Chief Complaint:  No chief complaint on file.    Patient Profile:    Heather [name] is a 86 y.o. female with:   Diastolic CHF  Hypertension   Chest pain   Myoview in 2007, 2009 normal   Coronary Ca by CT in 2014  Breast CA ? S/p L mastectomy   Chronic cough   Lower ext edema   Anxiety/Depression   Diverticulosis   Dementia   Prior CV studies: ABIs 03/31/16 Normal, bilaterally   Echocardiogram 11/08/09 EF 55-60, mild AI, MAC   History of Present Illness:    Heather [name] was last seen in 6/21. ***      Past Medical History:  Diagnosis Date  . Abdominal pain, left lower quadrant 06/22/2013  . Abnormal CT scan of lung 07/16/2015  . Allergic rhinitis, cause unspecified 02/21/2014  . Anxiety state, unspecified   . Breast cancer ([place])    NO STICK OR BLOOD PRESSURE CHECKS IN LEFT ARM  . Carpal tunnel syndrome   . Carpal tunnel syndrome on both sides 10/03/2008   Qualifier: Diagnosis of  By: Lenna Gilford MD, Deborra Medina   . Cervical spondylosis   . Chronic pain syndrome 03/03/2019  . Colon polyp 12/19/2009   Transverse-polypoid colorectal mucosa  . Constipation   . Cough 10/21/2014  . Degenerative joint disease   . Diastolic dysfunction   . Diverticula of colon   . Diverticulosis of colon 05/24/2002   Qualifier: Diagnosis of  By: Jerral Ralph    . Gastritis, chronic   . Hiatal hernia   . Hypertension   . Low back pain   . LPRD (laryngopharyngeal reflux disease) 04/04/2014  . Osteoporosis 05/22/2020  . RBBB (right bundle branch block with left anterior fascicular block)   . Renal cyst   . Venous insufficiency     Current Medications: No outpatient medications have been marked as taking for the  07/25/20 encounter (Appointment) with Richardson Dopp T, PA-C.     Allergies:   Fentanyl and Naprosyn [naproxen]   Social History   Tobacco Use  . Smoking status: Never Smoker  . Smokeless tobacco: Never Used  Vaping Use  . Vaping Use: Never used  Substance Use Topics  . Alcohol use: No    Alcohol/week: 0.0 standard drinks  . Drug use: No     Family Hx: The patient's family history is negative for Colon cancer, Pancreatic cancer, Esophageal cancer, and Stomach cancer.  ROS   EKGs/Labs/Other Test Reviewed:    EKG:  EKG is *** ordered today.  The ekg ordered today demonstrates ***  Recent Labs: 02/17/2020: ALT 21; BUN 21; Creatinine, Ser 1.15; Hemoglobin 13.2; Platelets 195; Potassium 4.0; Sodium 140   Recent Lipid Panel Lab Results  Component Value Date/Time   CHOL 199 02/02/2014 10:55 AM   TRIG 20.0 02/02/2014 10:55 AM   HDL 98.70 02/02/2014 10:55 AM   CHOLHDL 2 02/02/2014 10:55 AM   LDLCALC 96 02/02/2014 10:55 AM    Physical Exam:    VS:  There were no vitals taken for this visit.    Wt Readings from Last 3 Encounters:  05/17/20 115 lb 12.8 oz (52.5 kg)  04/30/20 115 lb 12.8 oz (52.5 kg)  04/17/20 112 lb (50.8 kg)     Physical Exam ***  ASSESSMENT & PLAN:    *** 1. Chronic diastolic congestive heart failure (HCC) Overall, her volume status appears to be stable.  Her blood pressure is well controlled.  She has chronic lower extremity swelling mainly related to venous insufficiency.  We discussed the importance of wearing compression hose today.  Continue current dose of furosemide and spironolactone.  Recent labs with primary care demonstrate stable renal function and potassium.  Follow-up in 1 year or sooner as needed.  Dispo:  No follow-ups on file.   Medication Adjustments/Labs and Tests Ordered: Current medicines are reviewed at length with the patient today.  Concerns regarding medicines are outlined above.  Tests Ordered: No orders of the defined types  were placed in this encounter.  Medication Changes: No orders of the defined types were placed in this encounter.   Signed, Richardson Dopp, PA-C  07/25/2020 1:22 PM    [facility] [address], [place], [place]  [address] Phone: [phone]; Fax: [phone]

## 2020-07-26 DIAGNOSIS — H1031 Unspecified acute conjunctivitis, right eye: Secondary | ICD-10-CM | POA: Diagnosis not present

## 2020-07-26 DIAGNOSIS — H16141 Punctate keratitis, right eye: Secondary | ICD-10-CM | POA: Diagnosis not present

## 2020-07-31 ENCOUNTER — Emergency Department (HOSPITAL_BASED_OUTPATIENT_CLINIC_OR_DEPARTMENT_OTHER): Payer: Medicare Other

## 2020-07-31 ENCOUNTER — Other Ambulatory Visit: Payer: Self-pay

## 2020-07-31 ENCOUNTER — Emergency Department (HOSPITAL_COMMUNITY)
Admission: EM | Admit: 2020-07-31 | Discharge: 2020-07-31 | Disposition: A | Discharge status: Home / Self Care | Payer: Medicare Other | Attending: Emergency Medicine | Admitting: Emergency Medicine

## 2020-07-31 ENCOUNTER — Encounter (HOSPITAL_COMMUNITY): Payer: Self-pay | Admitting: Emergency Medicine

## 2020-07-31 ENCOUNTER — Emergency Department (HOSPITAL_COMMUNITY): Payer: Medicare Other

## 2020-07-31 DIAGNOSIS — M7989 Other specified soft tissue disorders: Secondary | ICD-10-CM | POA: Insufficient documentation

## 2020-07-31 DIAGNOSIS — I10 Essential (primary) hypertension: Secondary | ICD-10-CM | POA: Diagnosis not present

## 2020-07-31 DIAGNOSIS — R52 Pain, unspecified: Secondary | ICD-10-CM | POA: Diagnosis not present

## 2020-07-31 DIAGNOSIS — M25462 Effusion, left knee: Secondary | ICD-10-CM | POA: Diagnosis not present

## 2020-07-31 DIAGNOSIS — I5030 Unspecified diastolic (congestive) heart failure: Secondary | ICD-10-CM | POA: Diagnosis not present

## 2020-07-31 DIAGNOSIS — Z79899 Other long term (current) drug therapy: Secondary | ICD-10-CM | POA: Insufficient documentation

## 2020-07-31 DIAGNOSIS — R5381 Other malaise: Secondary | ICD-10-CM | POA: Diagnosis not present

## 2020-07-31 DIAGNOSIS — M25562 Pain in left knee: Secondary | ICD-10-CM | POA: Insufficient documentation

## 2020-07-31 DIAGNOSIS — M79605 Pain in left leg: Secondary | ICD-10-CM | POA: Diagnosis not present

## 2020-07-31 DIAGNOSIS — M1712 Unilateral primary osteoarthritis, left knee: Secondary | ICD-10-CM | POA: Diagnosis not present

## 2020-07-31 DIAGNOSIS — M79609 Pain in unspecified limb: Secondary | ICD-10-CM

## 2020-07-31 DIAGNOSIS — R1032 Left lower quadrant pain: Secondary | ICD-10-CM | POA: Diagnosis not present

## 2020-07-31 DIAGNOSIS — I110 Hypertensive heart disease with heart failure: Secondary | ICD-10-CM | POA: Insufficient documentation

## 2020-07-31 DIAGNOSIS — R609 Edema, unspecified: Secondary | ICD-10-CM | POA: Diagnosis not present

## 2020-07-31 DIAGNOSIS — Z853 Personal history of malignant neoplasm of breast: Secondary | ICD-10-CM | POA: Diagnosis not present

## 2020-07-31 LAB — CBC WITH DIFFERENTIAL/PLATELET
Abs Immature Granulocytes: 0.01 10*3/uL (ref 0.00–0.07)
Basophils Absolute: 0 10*3/uL (ref 0.0–0.1)
Basophils Relative: 1 %
Eosinophils Absolute: 0.1 10*3/uL (ref 0.0–0.5)
Eosinophils Relative: 1 %
HCT: 44.7 % (ref 36.0–46.0)
Hemoglobin: 14 g/dL (ref 12.0–15.0)
Immature Granulocytes: 0 %
Lymphocytes Relative: 40 %
Lymphs Abs: 1.7 10*3/uL (ref 0.7–4.0)
MCH: 30.9 pg (ref 26.0–34.0)
MCHC: 31.3 g/dL (ref 30.0–36.0)
MCV: 98.7 fL (ref 80.0–100.0)
Monocytes Absolute: 0.6 10*3/uL (ref 0.1–1.0)
Monocytes Relative: 14 %
Neutro Abs: 1.8 10*3/uL (ref 1.7–7.7)
Neutrophils Relative %: 44 %
Platelets: 213 10*3/uL (ref 150–400)
RBC: 4.53 MIL/uL (ref 3.87–5.11)
RDW: 13.1 % (ref 11.5–15.5)
WBC: 4.1 10*3/uL (ref 4.0–10.5)
nRBC: 0 % (ref 0.0–0.2)

## 2020-07-31 LAB — URINALYSIS, ROUTINE W REFLEX MICROSCOPIC
Bilirubin Urine: NEGATIVE
Glucose, UA: NEGATIVE mg/dL
Hgb urine dipstick: NEGATIVE
Ketones, ur: NEGATIVE mg/dL
Leukocytes,Ua: NEGATIVE
Nitrite: NEGATIVE
Protein, ur: NEGATIVE mg/dL
Specific Gravity, Urine: 1.009 (ref 1.005–1.030)
pH: 8 (ref 5.0–8.0)

## 2020-07-31 LAB — COMPREHENSIVE METABOLIC PANEL
ALT: 16 U/L (ref 0–44)
AST: 33 U/L (ref 15–41)
Albumin: 3.8 g/dL (ref 3.5–5.0)
Alkaline Phosphatase: 34 U/L — ABNORMAL LOW (ref 38–126)
Anion gap: 11 (ref 5–15)
BUN: 20 mg/dL (ref 8–23)
CO2: 27 mmol/L (ref 22–32)
Calcium: 9.2 mg/dL (ref 8.9–10.3)
Chloride: 103 mmol/L (ref 98–111)
Creatinine, Ser: 1.05 mg/dL — ABNORMAL HIGH (ref 0.44–1.00)
GFR calc Af Amer: 50 mL/min — ABNORMAL LOW (ref >60)
GFR calc non Af Amer: 44 mL/min — ABNORMAL LOW (ref >60)
Glucose, Bld: 92 mg/dL (ref 70–99)
Potassium: 6.2 mmol/L — ABNORMAL HIGH (ref 3.5–5.1)
Sodium: 141 mmol/L (ref 135–145)
Total Bilirubin: 1 mg/dL (ref 0.3–1.2)
Total Protein: 6.8 g/dL (ref 6.5–8.1)

## 2020-07-31 LAB — LIPASE, BLOOD: Lipase: 41 U/L (ref 11–51)

## 2020-07-31 LAB — POTASSIUM: Potassium: 5.1 mmol/L (ref 3.5–5.1)

## 2020-07-31 MED ORDER — MORPHINE SULFATE (PF) 2 MG/ML IV SOLN
1.0000 mg | Freq: Once | INTRAVENOUS | Status: AC
  Administered 2020-07-31: 1 mg via INTRAVENOUS
  Filled 2020-07-31: qty 1

## 2020-07-31 NOTE — ED Triage Notes (Addendum)
Per[facility]-coming from home coming left knee pain and swelling-started this am-no injury or trauma-history of arthritis and CHF-difficulty with ambulation

## 2020-07-31 NOTE — ED Notes (Signed)
US at bedside

## 2020-07-31 NOTE — ED Provider Notes (Addendum)
[facility] DEPT Provider Note   CSN: [id number] Arrival date & time: 07/31/20  1638     History Chief Complaint  Patient presents with  . Knee Pain    Heather [name] is a 87 y.o. female history of hypertension, diverticula, chronic abdominal pain, anxiety, breast cancer, RBBB, venous insufficiency, CHF.  Patient presents today for left knee pain onset this morning after waking up described as a diffuse throbbing ache constant worsened with movement and palpation improved with rest.  Denies fall/injury.  Associated with leg swelling.  Patient reports chronic bilateral leg swelling feels left is worse than right.  Reports this is due to her CHF, she reports she only takes her Lasix intermittently because she does not like to use the bathroom very often.  Additionally on physical exam patient reported some abdominal pain reports this is chronic ongoing for the past 9 months reports CT scans in the past which were unrevealing.  She denies any change to her abdominal pain reports constant aching cramp nonradiating worse in the left lower quadrant improved with rest.  Denies fever/chills, fall/injury, headache, chest pain/shortness of breath, cough/hemoptysis, vomiting, diarrhea, dysuria/hematuria, numbness/tingling, weakness, back pain or any additional concerns.  HPI     Past Medical History:  Diagnosis Date  . Abdominal pain, left lower quadrant 06/22/2013  . Abnormal CT scan of lung 07/16/2015  . Allergic rhinitis, cause unspecified 02/21/2014  . Anxiety state, unspecified   . Breast cancer ([place])    NO STICK OR BLOOD PRESSURE CHECKS IN LEFT ARM  . Carpal tunnel syndrome   . Carpal tunnel syndrome on both sides 10/03/2008   Qualifier: Diagnosis of  By: Lenna Gilford MD, Deborra Medina   . Cervical spondylosis   . Chronic pain syndrome 03/03/2019  . Colon polyp 12/19/2009   Transverse-polypoid colorectal mucosa  . Constipation   . Cough 10/21/2014  . Degenerative  joint disease   . Diastolic dysfunction   . Diverticula of colon   . Diverticulosis of colon 05/24/2002   Qualifier: Diagnosis of  By: Jerral Ralph    . Gastritis, chronic   . Hiatal hernia   . Hypertension   . Low back pain   . LPRD (laryngopharyngeal reflux disease) 04/04/2014  . Osteoporosis 05/22/2020  . RBBB (right bundle branch block with left anterior fascicular block)   . Renal cyst   . Venous insufficiency     Patient Active Problem List   Diagnosis Date Noted  . Osteoporosis 05/22/2020  . 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 ([place]) 05/17/2020  . Bronchiectasis ([place]) 05/17/2020  . Kyphosis of thoracic region 05/17/2020  . SI (sacroiliac) joint inflammation ([place]) 10/04/2019  . DDD (degenerative disc disease), lumbar 10/04/2019  . Hearing decreased, bilateral 06/30/2019  . Chronic pain syndrome 03/03/2019  . Unilateral primary osteoarthritis, right knee 01/13/2019  . Medication management 02/28/2018  . Urinary incontinence without sensory awareness 12/02/2017  . Arthritis of left hip 10/20/2017  . Functional abdominal pain syndrome 09/02/2017  . Encounter for chronic pain management 07/22/2017  . Dementia with behavioral disturbance 06/25/2017  . Vitamin D deficiency 05/21/2017  . Chronic LLQ pain 11/23/2014  . Esophageal dysmotility 10/27/2014  . Arthritis of hand, degenerative 04/10/2014  . Other and unspecified hyperlipidemia 04/10/2014  . Chronic bronchitis ([place]) 05/30/2013  . Hx of breast cancer 08/30/2012  . Venous insufficiency of both lower extremities 06/12/2011  . GERD without esophagitis 03/27/2010  . Diastolic CHF ([place]) 45/36/4680  . Constipation 01/18/2009  . Cervical spondylosis without  myelopathy 03/21/2008  . Diaphragmatic hernia 12/26/2007  . Anxiety state 09/15/2007  . Primary hypertension 09/15/2007  . Venous (peripheral) insufficiency 09/15/2007    Past Surgical History:  Procedure Laterality Date  . CARPAL TUNNEL RELEASE    . Left  mastectomy       OB History   No obstetric history on file.     Family History  Problem Relation Age of Onset  . Colon cancer Neg Hx   . Pancreatic cancer Neg Hx   . Esophageal cancer Neg Hx   . Stomach cancer Neg Hx     Social History   Tobacco Use  . Smoking status: Never Smoker  . Smokeless tobacco: Never Used  Vaping Use  . Vaping Use: Never used  Substance Use Topics  . Alcohol use: No    Alcohol/week: 0.0 standard drinks  . Drug use: No    Home Medications Prior to Admission medications   Medication Sig Start Date End Date Taking? Authorizing Provider  albuterol (VENTOLIN HFA) 108 (90 Base) MCG/ACT inhaler Inhale 2 puffs into the lungs every 6 (six) hours as needed for wheezing or shortness of breath. Patient not taking: Reported on 05/17/2020    [provider]  ALPRAZolam Duanne Moron) 0.25 MG tablet Take 1 tablet (0.25 mg total) by mouth 2 (two) times daily as needed. for anxiety 07/02/20   Alcus Dad, MD  carvedilol (COREG) 3.125 MG tablet Take 1 tablet (3.125 mg total) by mouth 2 (two) times daily. 07/02/20   Alcus Dad, MD  cholecalciferol (VITAMIN D) 1000 units tablet Take 1,000 Units by mouth daily.    [provider]  famotidine (PEPCID) 20 MG tablet Take 1 tablet (20 mg total) by mouth daily. 05/17/20   McDiarmid, Blane Ohara, MD  feeding supplement, ENSURE ENLIVE, (ENSURE ENLIVE) LIQD Take 237 mLs by mouth 2 (two) times daily between meals. 09/30/16   Nche, Charlene Brooke, NP  furosemide (LASIX) 20 MG tablet Take 0.5 tablets (10 mg total) by mouth every other day. 07/12/20   Alcus Dad, MD  guaiFENesin (ROBITUSSIN) 100 MG/5ML liquid Take 200 mg by mouth 3 (three) times daily as needed for cough. Taking PRN    [provider]  levocetirizine (XYZAL) 5 MG tablet Take 5 mg by mouth as needed.  05/18/18   [provider]  Multiple Vitamins-Minerals (MULTIVITAMINS THER. W/MINERALS) TABS Take 1 tablet by mouth daily.    [provider]  polyethylene glycol powder (GLYCOLAX/MIRALAX) powder Take 17 g by mouth daily as needed. 04/27/18   Julianne Rice, MD  spironolactone (ALDACTONE) 25 MG tablet Take 1 tablet (25 mg total) by mouth daily. 02/03/20   [name], MD  tobramycin (TOBREX) 0.3 % ophthalmic solution  04/05/20   [provider]    Allergies    Fentanyl and Naprosyn [naproxen]  Review of Systems   Review of Systems Ten systems are reviewed and are negative for acute change except as noted in the HPI   Physical Exam Updated Vital Signs BP (!) 154/74 (BP Location: Right Arm)   Pulse 78   Temp 98.6 F (37 C) (Oral)   Resp 16   SpO2 100%   Physical Exam Constitutional:      General: She is not in acute distress.    Appearance: Normal appearance. She is well-developed. She is not ill-appearing or diaphoretic.  HENT:     Head: Normocephalic and atraumatic.  Eyes:     General: Vision grossly intact. Gaze aligned appropriately.  Pupils: Pupils are equal, round, and reactive to light.  Neck:     Trachea: Trachea and phonation normal.  Pulmonary:     Effort: Pulmonary effort is normal. No respiratory distress.  Abdominal:     General: There is no distension.     Palpations: Abdomen is soft.     Tenderness: There is abdominal tenderness in the left lower quadrant. There is no guarding or rebound. Negative signs include Murphy's sign and McBurney's sign.  Musculoskeletal:        General: Normal range of motion.     Cervical back: Normal range of motion.     Comments: No midline C/T/L spinal tenderness to palpation, no paraspinal muscle tenderness, no deformity, crepitus, or step-off noted. No sign of injury to the neck or back.  Pelvis stable to compression bilaterally without pain.  Appropriate range of motion and strength for age have all major joints of the bilateral upper extremities without pain.  Appropriate range of motion and strength of all major joints of the right  lower extremity for age.    Left Knee: Decreased range of motion secondary to pain, diffuse tenderness along the lateral and medial joint line without overlying skin change.  Appropriate range of motion at the left hip and left ankle for age without pain.  There is moderate swelling of the bilateral lower extremities, question left greater than right.  Skin changes suggestive of mild venous stasis.  Skin:    General: Skin is warm and dry.  Neurological:     Mental Status: She is alert.     GCS: GCS eye subscore is 4. GCS verbal subscore is 5. GCS motor subscore is 6.     Comments: Speech is clear and goal oriented, follows commands Major Cranial nerves without deficit, no facial droop Moves extremities without ataxia, coordination intact  Psychiatric:        Behavior: Behavior normal.     ED Results / Procedures / Treatments   Labs (all labs ordered are listed, but only abnormal results are displayed) Labs Reviewed  COMPREHENSIVE METABOLIC PANEL - Abnormal; Notable for the following components:      Result Value   Potassium 6.2 (*)    Creatinine, Ser 1.05 (*)    Alkaline Phosphatase 34 (*)    GFR calc non Af Amer 44 (*)    GFR calc Af Amer 50 (*)    All other components within normal limits  URINALYSIS, ROUTINE W REFLEX MICROSCOPIC - Abnormal; Notable for the following components:   Color, Urine STRAW (*)    All other components within normal limits  CBC WITH DIFFERENTIAL/PLATELET  LIPASE, BLOOD  POTASSIUM    EKG None  Radiology DG Knee Complete 4 Views Left  Result Date: 07/31/2020 CLINICAL DATA:  Left knee pain and swelling. No known trauma. EXAM: LEFT KNEE - COMPLETE 4+ VIEW COMPARISON:  None. FINDINGS: Significant tricompartmental degenerative changes with joint space narrowing, osteophytic spurring, bony eburnation and subchondral cystic change. No acute fracture or osteochondral lesion. Small suprapatellar knee joint effusion is noted. IMPRESSION: Significant  tricompartmental degenerative changes and small joint effusion. Electronically Signed   By: Marijo Sanes M.D.   On: 07/31/2020 12:21   VAS [place] LOWER EXTREMITY VENOUS (DVT) ([facility] and [facility] 7a-7p)  Result Date: 07/31/2020  Lower Venous DVTStudy Indications: Pain in left knee and bilateral edema.  Limitations: Poor ultrasound/tissue interface due to edema and skin changes. Comparison Study: 07-20-2018 Prior RT lower venous study. Performing Technologist: Darlin Coco  Examination Guidelines: A complete evaluation includes B-mode imaging, spectral Doppler, color Doppler, and power Doppler as needed of all accessible portions of each vessel. Bilateral testing is considered an integral part of a complete examination. Limited examinations for reoccurring indications may be performed as noted. The reflux portion of the exam is performed with the patient in reverse Trendelenburg.  +---------+---------------+---------+-----------+----------+--------------+ RIGHT    CompressibilityPhasicitySpontaneityPropertiesThrombus Aging +---------+---------------+---------+-----------+----------+--------------+ CFV      Full           Yes      Yes                                 +---------+---------------+---------+-----------+----------+--------------+ SFJ      Full                                                        +---------+---------------+---------+-----------+----------+--------------+ FV Prox  Full                                                        +---------+---------------+---------+-----------+----------+--------------+ FV Mid   Full                                                        +---------+---------------+---------+-----------+----------+--------------+ FV DistalFull                                                        +---------+---------------+---------+-----------+----------+--------------+ PFV      Full                                                         +---------+---------------+---------+-----------+----------+--------------+ POP      Full           Yes      Yes                                 +---------+---------------+---------+-----------+----------+--------------+ PTV      Full                                                        +---------+---------------+---------+-----------+----------+--------------+ PERO  Not visualized +---------+---------------+---------+-----------+----------+--------------+   +---------+---------------+---------+-----------+----------+---------------+ LEFT     CompressibilityPhasicitySpontaneityPropertiesThrombus Aging  +---------+---------------+---------+-----------+----------+---------------+ CFV      Full           Yes      Yes                                  +---------+---------------+---------+-----------+----------+---------------+ SFJ      Full                                                         +---------+---------------+---------+-----------+----------+---------------+ FV Prox  Full                                                         +---------+---------------+---------+-----------+----------+---------------+ FV Mid   Full                                                         +---------+---------------+---------+-----------+----------+---------------+ FV DistalFull                                                         +---------+---------------+---------+-----------+----------+---------------+ PFV      Full                                                         +---------+---------------+---------+-----------+----------+---------------+ POP      Full           Yes      Yes                                  +---------+---------------+---------+-----------+----------+---------------+ PTV                                                   Patent by color  +---------+---------------+---------+-----------+----------+---------------+ PERO                                                  Patent by color +---------+---------------+---------+-----------+----------+---------------+     Summary: RIGHT: - There is no evidence of deep vein thrombosis in the lower extremity. However, portions of this examination were limited- see technologist comments above.  - No cystic structure found in the popliteal fossa.  LEFT: - There is no evidence of deep  vein thrombosis in the lower extremity. However, portions of this examination were limited- see technologist comments above.  - No cystic structure found in the popliteal fossa.  *See table(s) above for measurements and observations.    Preliminary     Procedures Procedures (including critical care time)  Medications Ordered in ED Medications  morphine 2 MG/ML injection 1 mg (1 mg Intravenous Given 07/31/20 1150)    ED Course  I have reviewed the triage vital signs and the nursing notes.  Pertinent labs & imaging results that were available during my care of the patient were reviewed by me and considered in my medical decision making (see chart for details).  Clinical Course as of Aug 01 1519  Tue Jul 31, 2020  1043 Pleasant 86 year old female here with left knee pain and left lower quadrant pain.  Abdominal pain sounds like it has been more of a longstanding problem.  Getting labs duplex and knee x-ray.  Disposition per results of testing.   [MB]    Clinical Course User Index [MB] Hayden Rasmussen, MD   MDM Rules/Calculators/A&P                         Additional history obtained from: 1. Nursing notes from this visit. 2. Review of electronic medical record. 3. Family, patient's son [name] at bedside. --------------------------------- I ordered, reviewed and interpreted labs which include: Urinalysis shows no evidence of infection. Lipase within normal limits, doubt pancreatitis. CBC within  normal limits, no leukocytosis to suggest infection, no anemia. CMP shows hyperkalemia 6.2.  Kidney function appears to be baseline with creatinine 1.05, normal BUN.  LFTs without acute elevation.  No gap.  Question if sample hemolyzed will obtain repeat potassium level.  Bilateral LE DVT Study:  Summary:  RIGHT:  - There is no evidence of deep vein thrombosis in the lower extremity. However, portions of this examination were limited- see technologist comments above.    - No cystic structure found in the popliteal fossa.    LEFT:  - There is no evidence of deep vein thrombosis in the lower extremity. However, portions of this examination were limited- see technologist comments above.     DG Left Knee:  IMPRESSION:  Significant tricompartmental degenerative changes and small joint  effusion.  ------------------------------------- Repeat potassium levels within normal limits, suspect earlier was hemolyzed. On reassessment patient is well-appearing no acute distress vital signs within normal limits, she is pleasant.  Plan of care is for patient to follow-up with orthopedic as far as her left knee pain suspect arthritis, no evidence of septic arthritis, DVT, compartment syndrome, neurovascular compromise or other emergent pathologies.  Discussed rice therapy and OTC anti-inflammatories.  Patient plans to follow-up with her orthopedist this week for recheck.  Additionally patient without any abdominal pain on reexamination, her labs reassuring, doubt diverticulitis, SBO, appendicitis or other emergent intra-abdominal pathologies at this time.  Finally suspect patient's leg swelling is secondary to her noncompliance with Lasix therapy, I encourage patient to use all home medications as prescribed by her primary care doctor and to follow-up with them for recheck this week.  At this time there does not appear to be any evidence of an acute emergency medical condition and the patient appears stable  for discharge with appropriate outpatient follow up. Diagnosis was discussed with patient who verbalizes understanding of care plan and is agreeable to discharge. I have discussed return precautions with patient who verbalizes understanding. Patient encouraged to follow-up  with their PCP and orthopedist. All questions answered.  Rediscussed case with Dr. Melina Copa who saw and evaluated patient during this visit, agrees with discharge and outpatient follow-up.  Note: Portions of this report may have been transcribed using voice recognition software. Every effort was made to ensure accuracy; however, inadvertent computerized transcription errors may still be present. Final Clinical Impression(s) / ED Diagnoses Final diagnoses:  Pain  Acute pain of left knee    Rx / DC Orders ED Discharge Orders    None       Gari Crown 07/31/20 1520    Gari Crown 07/31/20 1522    Hayden Rasmussen, MD 07/31/20 1800

## 2020-07-31 NOTE — Progress Notes (Signed)
Lower extremity venous bilateral study completed  Preliminary results relayed to provider.   See CV Proc for preliminary results report.   Darlin Coco

## 2020-07-31 NOTE — ED Notes (Signed)
Pt placed on purewick 

## 2020-07-31 NOTE — Discharge Instructions (Addendum)
At this time there does not appear to be the presence of an emergent medical condition, however there is always the potential for conditions to change. Please read and follow the below instructions.  Please return to the Emergency Department immediately for any new or worsening symptoms. Please be sure to follow up with your Primary Care Provider within one week regarding your visit today; please call their office to schedule an appointment even if you are feeling better for a follow-up visit. Please call the orthopedic specialist with Dr. Rolena Infante on your discharge paperwork for follow-up appointment regarding your knee pain.  If your pain continues you may need follow-up imaging for further evaluation. Please be sure to take all of your home medications as prescribed.  Get help right away if: Your knee swells, and the swelling gets worse. You cannot move your knee. You have very bad knee pain. You have fever or chills. You cannot stop vomiting. Your pain is only in areas of your belly, such as the right side or the left lower part of the belly. You have bloody or black poop, or poop that looks like tar. You have very bad pain, cramping, or bloating in your belly. You have signs of not having enough fluid or water in your body (dehydration), such as: Dark pee, very little pee, or no pee. Cracked lips. Dry mouth. Sunken eyes. Sleepiness. Weakness. You have trouble breathing or chest pain. You have any new/concerning or worsening of symptoms  Please read the additional information packets attached to your discharge summary.  Do not take your medicine if  develop an itchy rash, swelling in your mouth or lips, or difficulty breathing; call 911 and seek immediate emergency medical attention if this occurs.  You may review your lab tests and imaging results in their entirety on your MyChart account.  Please discuss all results of fully with your primary care provider and other specialist at  your follow-up visit.  Note: Portions of this text may have been transcribed using voice recognition software. Every effort was made to ensure accuracy; however, inadvertent computerized transcription errors may still be present.

## 2020-08-08 DIAGNOSIS — R1032 Left lower quadrant pain: Secondary | ICD-10-CM | POA: Diagnosis not present

## 2020-08-08 DIAGNOSIS — K5900 Constipation, unspecified: Secondary | ICD-10-CM | POA: Diagnosis not present

## 2020-08-14 ENCOUNTER — Emergency Department (HOSPITAL_COMMUNITY): Payer: Medicare Other

## 2020-08-14 ENCOUNTER — Emergency Department (HOSPITAL_COMMUNITY)
Admission: EM | Admit: 2020-08-14 | Discharge: 2020-08-14 | Disposition: A | Discharge status: Home / Self Care | Payer: Medicare Other | Attending: Emergency Medicine | Admitting: Emergency Medicine

## 2020-08-14 ENCOUNTER — Other Ambulatory Visit: Payer: Self-pay

## 2020-08-14 DIAGNOSIS — G894 Chronic pain syndrome: Secondary | ICD-10-CM | POA: Diagnosis not present

## 2020-08-14 DIAGNOSIS — J479 Bronchiectasis, uncomplicated: Secondary | ICD-10-CM | POA: Diagnosis not present

## 2020-08-14 DIAGNOSIS — M5116 Intervertebral disc disorders with radiculopathy, lumbar region: Secondary | ICD-10-CM | POA: Diagnosis not present

## 2020-08-14 DIAGNOSIS — R109 Unspecified abdominal pain: Secondary | ICD-10-CM

## 2020-08-14 DIAGNOSIS — L89892 Pressure ulcer of other site, stage 2: Secondary | ICD-10-CM | POA: Diagnosis not present

## 2020-08-14 DIAGNOSIS — R600 Localized edema: Secondary | ICD-10-CM | POA: Diagnosis not present

## 2020-08-14 DIAGNOSIS — K5900 Constipation, unspecified: Secondary | ICD-10-CM | POA: Diagnosis not present

## 2020-08-14 DIAGNOSIS — M810 Age-related osteoporosis without current pathological fracture: Secondary | ICD-10-CM | POA: Diagnosis not present

## 2020-08-14 DIAGNOSIS — M40204 Unspecified kyphosis, thoracic region: Secondary | ICD-10-CM | POA: Diagnosis not present

## 2020-08-14 DIAGNOSIS — R350 Frequency of micturition: Secondary | ICD-10-CM | POA: Diagnosis not present

## 2020-08-14 DIAGNOSIS — N281 Cyst of kidney, acquired: Secondary | ICD-10-CM | POA: Diagnosis not present

## 2020-08-14 DIAGNOSIS — M19049 Primary osteoarthritis, unspecified hand: Secondary | ICD-10-CM | POA: Diagnosis not present

## 2020-08-14 DIAGNOSIS — M1711 Unilateral primary osteoarthritis, right knee: Secondary | ICD-10-CM | POA: Diagnosis not present

## 2020-08-14 DIAGNOSIS — I5030 Unspecified diastolic (congestive) heart failure: Secondary | ICD-10-CM | POA: Insufficient documentation

## 2020-08-14 DIAGNOSIS — I110 Hypertensive heart disease with heart failure: Secondary | ICD-10-CM | POA: Diagnosis not present

## 2020-08-14 DIAGNOSIS — I10 Essential (primary) hypertension: Secondary | ICD-10-CM | POA: Diagnosis not present

## 2020-08-14 DIAGNOSIS — F419 Anxiety disorder, unspecified: Secondary | ICD-10-CM | POA: Diagnosis not present

## 2020-08-14 DIAGNOSIS — K5790 Diverticulosis of intestine, part unspecified, without perforation or abscess without bleeding: Secondary | ICD-10-CM | POA: Diagnosis not present

## 2020-08-14 DIAGNOSIS — R1084 Generalized abdominal pain: Secondary | ICD-10-CM | POA: Diagnosis not present

## 2020-08-14 DIAGNOSIS — I700 Atherosclerosis of aorta: Secondary | ICD-10-CM | POA: Diagnosis not present

## 2020-08-14 DIAGNOSIS — F0391 Unspecified dementia with behavioral disturbance: Secondary | ICD-10-CM | POA: Diagnosis not present

## 2020-08-14 DIAGNOSIS — M47812 Spondylosis without myelopathy or radiculopathy, cervical region: Secondary | ICD-10-CM | POA: Diagnosis not present

## 2020-08-14 DIAGNOSIS — Z853 Personal history of malignant neoplasm of breast: Secondary | ICD-10-CM | POA: Insufficient documentation

## 2020-08-14 DIAGNOSIS — Z79899 Other long term (current) drug therapy: Secondary | ICD-10-CM | POA: Diagnosis not present

## 2020-08-14 DIAGNOSIS — K8689 Other specified diseases of pancreas: Secondary | ICD-10-CM | POA: Diagnosis not present

## 2020-08-14 DIAGNOSIS — I872 Venous insufficiency (chronic) (peripheral): Secondary | ICD-10-CM | POA: Diagnosis not present

## 2020-08-14 DIAGNOSIS — F0390 Unspecified dementia without behavioral disturbance: Secondary | ICD-10-CM | POA: Diagnosis not present

## 2020-08-14 DIAGNOSIS — R4182 Altered mental status, unspecified: Secondary | ICD-10-CM | POA: Diagnosis not present

## 2020-08-14 DIAGNOSIS — M1612 Unilateral primary osteoarthritis, left hip: Secondary | ICD-10-CM | POA: Diagnosis not present

## 2020-08-14 DIAGNOSIS — R410 Disorientation, unspecified: Secondary | ICD-10-CM | POA: Diagnosis not present

## 2020-08-14 LAB — URINALYSIS, ROUTINE W REFLEX MICROSCOPIC
Bacteria, UA: NONE SEEN
Bilirubin Urine: NEGATIVE
Glucose, UA: NEGATIVE mg/dL
Ketones, ur: NEGATIVE mg/dL
Leukocytes,Ua: NEGATIVE
Nitrite: NEGATIVE
Protein, ur: NEGATIVE mg/dL
Specific Gravity, Urine: 1.006 (ref 1.005–1.030)
pH: 8 (ref 5.0–8.0)

## 2020-08-14 LAB — COMPREHENSIVE METABOLIC PANEL
ALT: 15 U/L (ref 0–44)
AST: 22 U/L (ref 15–41)
Albumin: 3.5 g/dL (ref 3.5–5.0)
Alkaline Phosphatase: 29 U/L — ABNORMAL LOW (ref 38–126)
Anion gap: 8 (ref 5–15)
BUN: 21 mg/dL (ref 8–23)
CO2: 28 mmol/L (ref 22–32)
Calcium: 8.8 mg/dL — ABNORMAL LOW (ref 8.9–10.3)
Chloride: 105 mmol/L (ref 98–111)
Creatinine, Ser: 1 mg/dL (ref 0.44–1.00)
GFR calc Af Amer: 54 mL/min — ABNORMAL LOW (ref >60)
GFR calc non Af Amer: 46 mL/min — ABNORMAL LOW (ref >60)
Glucose, Bld: 90 mg/dL (ref 70–99)
Potassium: 3.9 mmol/L (ref 3.5–5.1)
Sodium: 141 mmol/L (ref 135–145)
Total Bilirubin: 0.6 mg/dL (ref 0.3–1.2)
Total Protein: 6.3 g/dL — ABNORMAL LOW (ref 6.5–8.1)

## 2020-08-14 LAB — CBC WITH DIFFERENTIAL/PLATELET
Abs Immature Granulocytes: 0 10*3/uL (ref 0.00–0.07)
Basophils Absolute: 0 10*3/uL (ref 0.0–0.1)
Basophils Relative: 1 %
Eosinophils Absolute: 0.1 10*3/uL (ref 0.0–0.5)
Eosinophils Relative: 2 %
HCT: 39.5 % (ref 36.0–46.0)
Hemoglobin: 12.8 g/dL (ref 12.0–15.0)
Immature Granulocytes: 0 %
Lymphocytes Relative: 32 %
Lymphs Abs: 1.7 10*3/uL (ref 0.7–4.0)
MCH: 31.2 pg (ref 26.0–34.0)
MCHC: 32.4 g/dL (ref 30.0–36.0)
MCV: 96.3 fL (ref 80.0–100.0)
Monocytes Absolute: 0.7 10*3/uL (ref 0.1–1.0)
Monocytes Relative: 14 %
Neutro Abs: 2.7 10*3/uL (ref 1.7–7.7)
Neutrophils Relative %: 51 %
Platelets: 184 10*3/uL (ref 150–400)
RBC: 4.1 MIL/uL (ref 3.87–5.11)
RDW: 13.2 % (ref 11.5–15.5)
WBC: 5.1 10*3/uL (ref 4.0–10.5)
nRBC: 0 % (ref 0.0–0.2)

## 2020-08-14 LAB — LIPASE, BLOOD: Lipase: 32 U/L (ref 11–51)

## 2020-08-14 LAB — LACTIC ACID, PLASMA: Lactic Acid, Venous: 0.6 mmol/L (ref 0.5–1.9)

## 2020-08-14 MED ORDER — IOHEXOL 9 MG/ML PO SOLN
ORAL | Status: AC
  Filled 2020-08-14: qty 500

## 2020-08-14 MED ORDER — IOHEXOL 300 MG/ML  SOLN
100.0000 mL | Freq: Once | INTRAMUSCULAR | Status: AC | PRN
  Administered 2020-08-14: 100 mL via INTRAVENOUS

## 2020-08-14 NOTE — ED Provider Notes (Signed)
[facility] DEPT Provider Note   CSN: [id number] Arrival date & time: 08/14/20  1444     History Chief Complaint  Patient presents with  . Urinary Frequency    Heather [name] is a 86 y.o. female.  The history is provided by the patient and medical records. No language interpreter was used.  Abdominal Pain Pain location:  Generalized Pain quality: aching, cramping, dull and sharp   Pain radiates to:  Does not radiate Pain severity:  Severe Onset quality:  Gradual Timing:  Intermittent Progression:  Waxing and waning Chronicity:  New Context: not trauma   Relieved by:  Nothing Worsened by:  Nothing Ineffective treatments:  None tried Associated symptoms: no chest pain, no chills, no constipation, no cough, no diarrhea, no dysuria, no fatigue, no fever, no nausea, no shortness of breath and no vomiting        Past Medical History:  Diagnosis Date  . Abdominal pain, left lower quadrant 06/22/2013  . Abnormal CT scan of lung 07/16/2015  . Allergic rhinitis, cause unspecified 02/21/2014  . Anxiety state, unspecified   . Breast cancer ([place])    NO STICK OR BLOOD PRESSURE CHECKS IN LEFT ARM  . Carpal tunnel syndrome   . Carpal tunnel syndrome on both sides 10/03/2008   Qualifier: Diagnosis of  By: Lenna Gilford MD, Deborra Medina   . Cervical spondylosis   . Chronic pain syndrome 03/03/2019  . Colon polyp 12/19/2009   Transverse-polypoid colorectal mucosa  . Constipation   . Cough 10/21/2014  . Degenerative joint disease   . Diastolic dysfunction   . Diverticula of colon   . Diverticulosis of colon 05/24/2002   Qualifier: Diagnosis of  By: Jerral Ralph    . Gastritis, chronic   . Hiatal hernia   . Hypertension   . Low back pain   . LPRD (laryngopharyngeal reflux disease) 04/04/2014  . Osteoporosis 05/22/2020  . RBBB (right bundle branch block with left anterior fascicular block)   . Renal cyst   . Venous insufficiency     Patient Active  Problem List   Diagnosis Date Noted  . Osteoporosis 05/22/2020  . 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 ([place]) 05/17/2020  . Bronchiectasis ([place]) 05/17/2020  . Kyphosis of thoracic region 05/17/2020  . SI (sacroiliac) joint inflammation ([place]) 10/04/2019  . DDD (degenerative disc disease), lumbar 10/04/2019  . Hearing decreased, bilateral 06/30/2019  . Chronic pain syndrome 03/03/2019  . Unilateral primary osteoarthritis, right knee 01/13/2019  . Medication management 02/28/2018  . Urinary incontinence without sensory awareness 12/02/2017  . Arthritis of left hip 10/20/2017  . Functional abdominal pain syndrome 09/02/2017  . Encounter for chronic pain management 07/22/2017  . Dementia with behavioral disturbance 06/25/2017  . Vitamin D deficiency 05/21/2017  . Chronic LLQ pain 11/23/2014  . Esophageal dysmotility 10/27/2014  . Arthritis of hand, degenerative 04/10/2014  . Other and unspecified hyperlipidemia 04/10/2014  . Chronic bronchitis ([place]) 05/30/2013  . Hx of breast cancer 08/30/2012  . Venous insufficiency of both lower extremities 06/12/2011  . GERD without esophagitis 03/27/2010  . Diastolic CHF ([place]) 13/24/4010  . Constipation 01/18/2009  . Cervical spondylosis without myelopathy 03/21/2008  . Diaphragmatic hernia 12/26/2007  . Anxiety state 09/15/2007  . Primary hypertension 09/15/2007  . Venous (peripheral) insufficiency 09/15/2007    Past Surgical History:  Procedure Laterality Date  . CARPAL TUNNEL RELEASE    . Left mastectomy       OB History   No obstetric history on file.  Family History  Problem Relation Age of Onset  . Colon cancer Neg Hx   . Pancreatic cancer Neg Hx   . Esophageal cancer Neg Hx   . Stomach cancer Neg Hx     Social History   Tobacco Use  . Smoking status: Never Smoker  . Smokeless tobacco: Never Used  Vaping Use  . Vaping Use: Never used  Substance Use Topics  . Alcohol use: No    Alcohol/week: 0.0 standard drinks  . Drug  use: No    Home Medications Prior to Admission medications   Medication Sig Start Date End Date Taking? Authorizing Provider  albuterol (VENTOLIN HFA) 108 (90 Base) MCG/ACT inhaler Inhale 2 puffs into the lungs every 6 (six) hours as needed for wheezing or shortness of breath. Patient not taking: Reported on 05/17/2020    [provider]  ALPRAZolam Duanne Moron) 0.25 MG tablet Take 1 tablet (0.25 mg total) by mouth 2 (two) times daily as needed. for anxiety 07/02/20   Alcus Dad, MD  carvedilol (COREG) 3.125 MG tablet Take 1 tablet (3.125 mg total) by mouth 2 (two) times daily. 07/02/20   Alcus Dad, MD  cholecalciferol (VITAMIN D) 1000 units tablet Take 1,000 Units by mouth daily.    [provider]  famotidine (PEPCID) 20 MG tablet Take 1 tablet (20 mg total) by mouth daily. 05/17/20   McDiarmid, Blane Ohara, MD  feeding supplement, ENSURE ENLIVE, (ENSURE ENLIVE) LIQD Take 237 mLs by mouth 2 (two) times daily between meals. 09/30/16   Nche, Charlene Brooke, NP  furosemide (LASIX) 20 MG tablet Take 0.5 tablets (10 mg total) by mouth every other day. 07/12/20   Alcus Dad, MD  guaiFENesin (ROBITUSSIN) 100 MG/5ML liquid Take 200 mg by mouth 3 (three) times daily as needed for cough. Taking PRN    [provider]  levocetirizine (XYZAL) 5 MG tablet Take 5 mg by mouth as needed.  05/18/18   [provider]  Multiple Vitamins-Minerals (MULTIVITAMINS THER. W/MINERALS) TABS Take 1 tablet by mouth daily.    [provider]  polyethylene glycol powder (GLYCOLAX/MIRALAX) powder Take 17 g by mouth daily as needed. 04/27/18   Julianne Rice, MD  spironolactone (ALDACTONE) 25 MG tablet Take 1 tablet (25 mg total) by mouth daily. 02/03/20   [name], MD  tobramycin (TOBREX) 0.3 % ophthalmic solution  04/05/20   [provider]    Allergies    Fentanyl and Naprosyn [naproxen]  Review of Systems   Review of Systems  Constitutional: Negative for  chills, diaphoresis, fatigue and fever.  HENT: Negative for congestion.   Eyes: Negative for visual disturbance.  Respiratory: Negative for cough, chest tightness, shortness of breath and wheezing.   Cardiovascular: Negative for chest pain, palpitations and leg swelling.  Gastrointestinal: Positive for abdominal pain. Negative for constipation, diarrhea, nausea and vomiting.  Genitourinary: Positive for frequency. Negative for difficulty urinating, dysuria and flank pain.  Musculoskeletal: Negative for back pain, neck pain and neck stiffness.  Skin: Negative for rash and wound.  Neurological: Negative for light-headedness and headaches.  Psychiatric/Behavioral: Negative for agitation and confusion.  All other systems reviewed and are negative.   Physical Exam Updated Vital Signs BP (!) 157/84 (BP Location: Right Arm)   Pulse 73   Temp 98.2 F (36.8 C) (Oral)   Resp 20   SpO2 99%   Physical Exam Vitals and nursing note reviewed.  Constitutional:      General: She is not in acute distress.  Appearance: She is well-developed. She is not ill-appearing, toxic-appearing or diaphoretic.  HENT:     Head: Normocephalic and atraumatic.     Nose: No congestion or rhinorrhea.     Mouth/Throat:     Mouth: Mucous membranes are moist.     Pharynx: No oropharyngeal exudate or posterior oropharyngeal erythema.  Eyes:     Extraocular Movements: Extraocular movements intact.     Conjunctiva/sclera: Conjunctivae normal.     Pupils: Pupils are equal, round, and reactive to light.  Cardiovascular:     Rate and Rhythm: Normal rate and regular rhythm.     Pulses: Normal pulses.     Heart sounds: No murmur heard.   Pulmonary:     Effort: Pulmonary effort is normal. No respiratory distress.     Breath sounds: Normal breath sounds. No wheezing, rhonchi or rales.  Chest:     Chest wall: No tenderness.  Abdominal:     General: Abdomen is flat. There is no distension.     Palpations: Abdomen is  soft.     Tenderness: There is abdominal tenderness. There is no right CVA tenderness, left CVA tenderness, guarding or rebound.  Musculoskeletal:        General: No tenderness.     Cervical back: Neck supple. No tenderness.     Right lower leg: Edema present.     Left lower leg: Edema present.  Skin:    General: Skin is warm and dry.     Findings: No erythema.  Neurological:     General: No focal deficit present.     Mental Status: She is alert.     Sensory: No sensory deficit.     Motor: No weakness.  Psychiatric:        Mood and Affect: Mood normal.     ED Results / Procedures / Treatments   Labs (all labs ordered are listed, but only abnormal results are displayed) Labs Reviewed  URINALYSIS, ROUTINE W REFLEX MICROSCOPIC - Abnormal; Notable for the following components:      Result Value   Color, Urine COLORLESS (*)    Hgb urine dipstick SMALL (*)    All other components within normal limits  COMPREHENSIVE METABOLIC PANEL - Abnormal; Notable for the following components:   Calcium 8.8 (*)    Total Protein 6.3 (*)    Alkaline Phosphatase 29 (*)    GFR calc non Af Amer 46 (*)    GFR calc Af Amer 54 (*)    All other components within normal limits  URINE CULTURE  CBC WITH DIFFERENTIAL/PLATELET  LACTIC ACID, PLASMA  LIPASE, BLOOD    EKG None  Radiology CT ABDOMEN PELVIS W CONTRAST  Result Date: 08/14/2020 CLINICAL DATA:  86 year old with a acute nonlocalized abdominal pain. Urinary frequency. EXAM: CT ABDOMEN AND PELVIS WITH CONTRAST TECHNIQUE: Multidetector CT imaging of the abdomen and pelvis was performed using the standard protocol following bolus administration of intravenous contrast. CONTRAST:  136mL OMNIPAQUE IOHEXOL 300 MG/ML  SOLN COMPARISON:  CT 11/07/2019 FINDINGS: Lower chest: Mild cardiomegaly. Minor dependent atelectasis in the lung bases. No pleural fluid. Hepatobiliary: Punctate granuloma in the right lobe. Small cyst in the subcapsular left lobe are  unchanged. Unremarkable gallbladder without calcified gallstone. No biliary dilatation. Pancreas: No ductal dilatation or inflammation. Occasional punctate calcifications adjacent to the pancreatic head. Spleen: Small in size.  No focal abnormality. Adrenals/Urinary Tract: No adrenal nodule. Bilateral extrarenal pelvis configuration, unchanged. Simple cyst in the upper right kidney. There is no  perinephric edema or fat stranding. Homogeneous enhancement with symmetric excretion on delayed phase imaging. No ureteral dilatation. No bladder wall thickening or stone. Urinary bladder is physiologically distended. Stomach/Bowel: Decompressed stomach. No small bowel obstruction, administered enteric contrast reaches the mid distal small bowel loops. There is no small bowel dilatation. The appendix is normal. Moderate stool burden throughout the colon. Transverse colon is tortuous. Air in stool in the rectum without rectal wall thickening. There is no bowel inflammation. Vascular/Lymphatic: Aorto bi-iliac atherosclerosis. No aortic aneurysm. Patent portal vein. No acute vascular findings. No bulky abdominopelvic adenopathy, paucity of intra-abdominal fat limits detailed assessment. Reproductive: Uterus in situ, stable from prior exam. There is no adnexal mass. Other: No free air, free fluid, or intra-abdominal fluid collection. There is minimal body wall edema. Musculoskeletal: Diffuse degenerative change in the spine. Sclerosis of the pubic symphysis unchanged. There are no acute or suspicious osseous abnormalities. IMPRESSION: 1. No acute abnormality in the abdomen/pelvis. 2. Moderate colonic stool burden, can be seen with constipation. Stool burden is decreased from December exam. 3. Additional stable chronic findings as described. Aortic Atherosclerosis (ICD10-I70.0). Electronically Signed   By: Keith Rake M.D.   On: 08/14/2020 19:13    Procedures Procedures (including critical care time)  Medications  Ordered in ED Medications  iohexol (OMNIPAQUE) 9 MG/ML oral solution (has no administration in time range)  iohexol (OMNIPAQUE) 300 MG/ML solution 100 mL (100 mLs Intravenous Contrast Given 08/14/20 1817)    ED Course  I have reviewed the triage vital signs and the nursing notes.  Pertinent labs & imaging results that were available during my care of the patient were reviewed by me and considered in my medical decision making (see chart for details).    MDM Rules/Calculators/A&P                          Heather [name] is a 86 y.o. female with a past medical history significant for hypertension, CHF, GERD, prior diverticular disease, dementia, and prior breast cancer who presents with urinary frequency and severe abdominal pain.  Patient reports that a new healthcare provider saw her today and sent her into the emergency room for evaluation of urinary frequency.  She says that she chronically had urinary frequency but it was a new person that did not know her well.  She does agree that she has been urinating a lot.  She denies dysuria.  Patient reports he is more concerned about her severe abdominal pain.  She reports is gone on for weeks but she describes the pain gets up to a 14 out of 10 in severity and doubles her over with writhing pain.  She reports it comes and goes.  She denies any provoking symptoms.  On exam, she does have bowel sounds but her abdomen is tender to palpation diffusely.  Lungs are clear and chest is nontender.  Back is nontender.  She is moving all extremities.  She is resting comfortably when not having her abdomen palpated.  We will get urinalysis to look for UTI however due to the patient's history of diverticular disease and the severe abdominal pain, we will get labs and imaging to further evaluate.  Anticipate reassessment after work-up to determine disposition.  4:15 PM The patient son has arrived and agrees with work-up and focusing on the urine and the  abdominal pain to look for diverticular problems.  9:07 PM Patient's work-up returned overall reassuring.  Urinalysis does not show UTI.  Overall labs reassuring.  CT scan does not show obstruction or diverticulitis but does show tortuous colon with moderate stool burden.  I suspect more constipation than anything else causing her discomfort.  I discussed this with the patient and son and he agrees.  They saw the GI team last week and are slowly escalating her bowel regimen.  They will continue this.  Patient we discharged to follow-up with GI and PCP and family agrees.  Patient discharged in good condition.    Final Clinical Impression(s) / ED Diagnoses Final diagnoses:  Abdominal pain, unspecified abdominal location  Constipation, unspecified constipation type    Rx / DC Orders ED Discharge Orders    None      Clinical Impression: 1. Abdominal pain, unspecified abdominal location   2. Constipation, unspecified constipation type     Disposition: Discharge  Condition: Good  I have discussed the results, Dx and Tx plan with the pt(& family if present). He/she/they expressed understanding and agree(s) with the plan. Discharge instructions discussed at great length. Strict return precautions discussed and pt &/or family have verbalized understanding of the instructions. No further questions at time of discharge.    New Prescriptions   No medications on file    Follow Up: Your PCP and GI teams        Tadeo Besecker, Gwenyth Allegra, MD 08/14/20 2127

## 2020-08-14 NOTE — ED Notes (Signed)
Pt placed on purewick at 60 mmHg. 

## 2020-08-14 NOTE — ED Triage Notes (Addendum)
Arrives via EMS from home, C/C home health nurse called EMS for potential AMS/ confusion. Patient is alert and oriented x4, home health nurse is unable to tell how she is altered. Patient reports frequent urination, pt believes this is D/T old age, not infection. Patient stated she is not confused, but she is confused as to why her home health nurse called EMS.

## 2020-08-14 NOTE — Discharge Instructions (Signed)
Your work-up today did not show concerning abdominal findings and no evidence of urinary tract infection was seen.  A culture was sent.  Your labs were similar and reassuring to prior.  Your CT scan showed moderate stool burden with a tortuous colon as we discussed.  There is no obstruction, no appendicitis.  No abscess or other acute abnormality.  Please follow-up with her GI team and PCP.  Please continue her escalating bowel regimen at home.  If any symptoms change or worsen, please return to the nearest emergency department.

## 2020-08-14 NOTE — ED Notes (Signed)
EDP at bedside  

## 2020-08-14 NOTE — ED Notes (Signed)
Family at bedside,  ?

## 2020-08-15 LAB — URINE CULTURE

## 2020-08-23 ENCOUNTER — Other Ambulatory Visit: Payer: Self-pay | Admitting: Family Medicine

## 2020-08-23 DIAGNOSIS — I872 Venous insufficiency (chronic) (peripheral): Secondary | ICD-10-CM

## 2020-08-23 DIAGNOSIS — I5033 Acute on chronic diastolic (congestive) heart failure: Secondary | ICD-10-CM

## 2020-08-28 ENCOUNTER — Other Ambulatory Visit: Payer: Self-pay | Admitting: Family Medicine

## 2020-09-15 ENCOUNTER — Other Ambulatory Visit: Payer: Self-pay | Admitting: Family Medicine

## 2020-09-15 DIAGNOSIS — I872 Venous insufficiency (chronic) (peripheral): Secondary | ICD-10-CM

## 2020-09-15 DIAGNOSIS — I5033 Acute on chronic diastolic (congestive) heart failure: Secondary | ICD-10-CM

## 2020-10-02 ENCOUNTER — Ambulatory Visit: Payer: Medicare Other

## 2020-10-02 ENCOUNTER — Other Ambulatory Visit: Payer: Self-pay | Admitting: *Deleted

## 2020-10-02 DIAGNOSIS — Z748 Other problems related to care provider dependency: Secondary | ICD-10-CM

## 2020-10-02 NOTE — Progress Notes (Signed)
    SUBJECTIVE:   CHIEF COMPLAINT / HPI:   abd pain: pt complains of chronic lower left quadrant pain. No n/v/d/constipation.  No changes in appetite.  She says she has a specialist for this.   Leg swelling: pt states she has painful leg swelling bilaterally. She wears compression stockings. She feels like her leg swelling is worse.    Ulceration on buttock: pt has a lesion on her buttock that she says is painful.  She says it is because she doesn't have any fat back there anymore.  She can feel it when she is using the bathroom.  She has been soaking it in a bathtub occasionally.  She has not noticed any bleeding.  No fever or signs of infection.     PERTINENT  PMH / PSH:   OBJECTIVE:   BP 120/65   Pulse 76   Ht 5\' 1"  (1.549 m)   SpO2 98%   BMI 21.88 kg/m   Gen: alert, oriented.  No acute distress:  Cv: RRR.  Pulm: LCTAB Abd: soft, mildly tender to palpation in the LLQ.   Ext: wearing compressoin stockings.  1+ edema in the Lower extremities midway up the calves bilaterally. Mildly tender bilaterally.   Skin: thickened callous on the left gluteus near the rectum.  No bleeding or signs of infection.  Psych: pleasant affect.  Very talkative.    ASSESSMENT/PLAN:   Chronic LLQ pain Similar to her well documented chronic abdominal pain that has had extensive workup.  Pt in no acute distress today.  Reassured pt that there was nothing concerning on her exam.   Venous insufficiency of both lower extremities Appears to be a chronic issue for pt. There is nothing concerning for DVT or a cause other than her chronic venous insufficiency. Lungs clear, not otherwise appearing to be volume overload, so edema due to CHF is unlikely. reassured pt and encouraged her to continue wearing compression stockings   Pre-ulcerative corn or callous Thickened, rough patch of skin along the medial left gluteus near the rectum.  Noninfectious, nonbloody.  Encouraged pt to continue using vaseline on the  area.       Benay Pike, MD [place]

## 2020-10-02 NOTE — Progress Notes (Signed)
Patient called to schedule an appointment for dark stools.  She is needing assistance with transportation.  Referral for CCM made and Rn case manager is aware and will help with this need.  Dorothie Wah,CMA

## 2020-10-02 NOTE — Chronic Care Management (AMB) (Signed)
   RNCM Care Management Collaboration 10/02/2020 Name: ANHELICA FOWERS MRN: [id number] DOB: 1918-07-26   [name] is a 86 y.o. year old female who sees Alcus Dad, MD for primary care. RNCM was consulted by Candelaria Celeste front desk receptionist  to assistance patient with  Care Coordination.       Intervention: Review of patient status, including review of consultants reports, relevant laboratory and other test results, and collaboration with appropriate care team members and the patient's provider was performed as part of comprehensive patient evaluation and provision of chronic care management services.  Called and spoke with Mrs. Brugger to discuss transportation to the office for her appointment.  She stated that she was able to get in touch with her friend and she no longer needed assistance for transportation into the office. She knows that she has an appointment at the office tomorrow at 11 am.  This was also confirmed by [facility] speaking back with the patient and the son.    Plan:  No further follow up required  Referral will be closed.

## 2020-10-03 ENCOUNTER — Other Ambulatory Visit: Payer: Self-pay

## 2020-10-03 ENCOUNTER — Ambulatory Visit: Payer: Medicare Other | Admitting: Family Medicine

## 2020-10-03 ENCOUNTER — Encounter: Payer: Self-pay | Admitting: Family Medicine

## 2020-10-03 VITALS — BP 120/65 | HR 76 | Ht 61.0 in

## 2020-10-03 DIAGNOSIS — L84 Corns and callosities: Secondary | ICD-10-CM | POA: Diagnosis not present

## 2020-10-03 DIAGNOSIS — G8929 Other chronic pain: Secondary | ICD-10-CM | POA: Diagnosis not present

## 2020-10-03 DIAGNOSIS — R1032 Left lower quadrant pain: Secondary | ICD-10-CM

## 2020-10-03 DIAGNOSIS — I872 Venous insufficiency (chronic) (peripheral): Secondary | ICD-10-CM

## 2020-10-03 NOTE — Patient Instructions (Signed)
It was nice to meet you today,  The spot on your buttocks is not infected.  It looks to be a callus.  I would continue putting Vaseline on it.  Your abdominal pain seems consistent with previous descriptions of your chronic abdominal pain.  There is nothing I think we need to do for it today.  For your leg swelling, this also appears to be consistent with previous encounters.  I do not think we need to adjust any medications.  I think you should schedule an appointment with your primary care provider, Dr. Rock Nephew, so that you may get to know her better.  Have a great day,  Clemetine Marker, MD

## 2020-10-04 ENCOUNTER — Ambulatory Visit: Payer: Medicare Other | Admitting: Family Medicine

## 2020-10-04 DIAGNOSIS — L84 Corns and callosities: Secondary | ICD-10-CM | POA: Insufficient documentation

## 2020-10-04 NOTE — Assessment & Plan Note (Signed)
Appears to be a chronic issue for pt. There is nothing concerning for DVT or a cause other than her chronic venous insufficiency. Lungs clear, not otherwise appearing to be volume overload, so edema due to CHF is unlikely. reassured pt and encouraged her to continue wearing compression stockings

## 2020-10-04 NOTE — Assessment & Plan Note (Signed)
Similar to her well documented chronic abdominal pain that has had extensive workup.  Pt in no acute distress today.  Reassured pt that there was nothing concerning on her exam.

## 2020-10-04 NOTE — Assessment & Plan Note (Signed)
Thickened, rough patch of skin along the medial left gluteus near the rectum.  Noninfectious, nonbloody.  Encouraged pt to continue using vaseline on the area.

## 2020-10-08 ENCOUNTER — Ambulatory Visit: Payer: Self-pay | Admitting: Family

## 2020-10-09 ENCOUNTER — Encounter: Payer: Self-pay | Admitting: Family

## 2020-10-09 ENCOUNTER — Other Ambulatory Visit: Payer: Self-pay

## 2020-10-09 ENCOUNTER — Ambulatory Visit (INDEPENDENT_AMBULATORY_CARE_PROVIDER_SITE_OTHER): Payer: Medicare Other | Admitting: Family

## 2020-10-09 VITALS — BP 110/60 | HR 83 | Temp 98.7°F | Resp 16 | Ht 61.0 in | Wt 111.4 lb

## 2020-10-09 DIAGNOSIS — K5901 Slow transit constipation: Secondary | ICD-10-CM

## 2020-10-09 DIAGNOSIS — E559 Vitamin D deficiency, unspecified: Secondary | ICD-10-CM | POA: Diagnosis not present

## 2020-10-09 DIAGNOSIS — H6123 Impacted cerumen, bilateral: Secondary | ICD-10-CM

## 2020-10-09 DIAGNOSIS — K449 Diaphragmatic hernia without obstruction or gangrene: Secondary | ICD-10-CM

## 2020-10-09 DIAGNOSIS — R399 Unspecified symptoms and signs involving the genitourinary system: Secondary | ICD-10-CM | POA: Diagnosis not present

## 2020-10-09 DIAGNOSIS — I5032 Chronic diastolic (congestive) heart failure: Secondary | ICD-10-CM

## 2020-10-09 DIAGNOSIS — F411 Generalized anxiety disorder: Secondary | ICD-10-CM

## 2020-10-09 DIAGNOSIS — R5383 Other fatigue: Secondary | ICD-10-CM

## 2020-10-09 DIAGNOSIS — M159 Polyosteoarthritis, unspecified: Secondary | ICD-10-CM

## 2020-10-09 DIAGNOSIS — M8949 Other hypertrophic osteoarthropathy, multiple sites: Secondary | ICD-10-CM

## 2020-10-09 DIAGNOSIS — K219 Gastro-esophageal reflux disease without esophagitis: Secondary | ICD-10-CM

## 2020-10-09 DIAGNOSIS — R239 Unspecified skin changes: Secondary | ICD-10-CM

## 2020-10-09 DIAGNOSIS — I110 Hypertensive heart disease with heart failure: Secondary | ICD-10-CM

## 2020-10-09 LAB — POCT URINALYSIS DIPSTICK
Bilirubin, UA: NEGATIVE
Blood, UA: POSITIVE
Glucose, UA: NEGATIVE
Ketones, UA: NEGATIVE
Nitrite, UA: NEGATIVE
Protein, UA: NEGATIVE
Spec Grav, UA: 1.01 (ref 1.010–1.025)
Urobilinogen, UA: NEGATIVE E.U./dL — AB
pH, UA: 7.5 (ref 5.0–8.0)

## 2020-10-09 MED ORDER — ZINC OXIDE 11.3 % EX CREA: 1.0000 "application " | TOPICAL_CREAM | Freq: Two times a day (BID) | CUTANEOUS | 1 refills | Status: DC

## 2020-10-09 NOTE — Patient Instructions (Addendum)
-   apply zinc oxide to affected area on sacral area once daily

## 2020-10-09 NOTE — Progress Notes (Addendum)
Provider: Nikiah Goin FNP-C   Alcus Dad, MD  Patient Care Team: Alcus Dad, MD as PCP - General (Family Medicine) Josue Hector, MD as PCP - Cardiology (Cardiology) Peggye Form, MD as Referring Physician (Internal Medicine) Garald Balding, MD as Attending Physician (Orthopedic Surgery) Debbra Riding, MD as Consulting Physician (Ophthalmology) Noralee Space, MD as Consulting Physician (Pulmonary Disease)  Extended Emergency Contact Information Primary Emergency Contact: Wojahn,Michael Address: [place], [place] [address] [address] [place] Phone: [phone] Relation: Son Secondary Emergency Contact: Chrissie Noa Mobile Phone: [phone] Relation: Other Preferred language: Cleophus Molt Interpreter needed? No  Code Status:  Full Code  Goals of care: Advanced Directive information Advanced Directives 10/09/2020  Does Patient Have a Medical Advance Directive? Yes  Type of Advance Directive Living will  Does patient want to make changes to medical advance directive? No - Patient declined  Copy of [place] in Chart? -  Would patient like information on creating a medical advance directive? -     Chief Complaint  Patient presents with  . Establish Care    New Patient.    HPI:  Pt is a 86 y.o. female seen today to establish care  for medical management of chronic diseases.she was previously following up with Dr. Alcus Dad. she is here with her son michael.she complains of sacral area states whenever she wipes has had some milky white stuff.she denies any pain on sacral area.she had open wound in the past.States has lost weight and has no fat on the sacral area.  Also complains of worsening hearing.States was referred to a specialist but has not called her for appointment.she is upset about not hearing from specialist and keeps talking about it over and over again.she wants her ears checked today.     Her weight was 111.4 lbs today latest weight on record 115 lbs ( 08/08/2020).she drinks Ensure by mouth twice daily.    Hypertension - on spironolactone 25 mg tablet daily and Carvedilol 3.125 mg tablet twice daily. States sometimes gets dizzy when standing in the [place] but goes away after she sits for few minutes.No dizziness today.  Diastolic Congestive Heart Failure - has had bilateral leg swelling on furosemide 10 mg tablet every other day. Has shortness of breath worst with exertion if she walks too far. Coughs up thick whitish phlegm sometimes.No worsening leg swelling  GERD with hiatal Hernia - symptoms controlled on Famotidine 20 mg tablet daily.reports stool sometimes dark in color after eating vegetables.No blood noted.    Constipation - on senokot daily.sometimes really dark when she eats vegetables.  Vitamin D deficiency - on vitamin D 1000 units daily.  Dry eyes - on systane one drop  Daily as needed.follows up with Dr.Groat.  She lives by herself.Son lives few miles away from her and visits daily.Also has [employer] health care to assist with her ADL's though prefers to do own ADL's  As much as she can.Gets one meal per day from meals on wheels then she cooks own other meals.  Her gait is unsteady and walks with a rolling walking.Recenlty completed Physical therapy.Son states has maxed out on her therapy session for the year.    Osteoarthritis - worst on the left knee.on tylenol 500 mg tablet every 6 hrs as needed for pain.    Past Medical History:  Diagnosis Date  . Abdominal pain, left lower quadrant 06/22/2013  . Abnormal  CT scan of lung 07/16/2015  . Allergic rhinitis, cause unspecified 02/21/2014  . Anxiety state, unspecified   . Breast cancer ([place])    NO STICK OR BLOOD PRESSURE CHECKS IN LEFT ARM  . Carpal tunnel syndrome   . Carpal tunnel syndrome on both sides 10/03/2008   Qualifier: Diagnosis of  By: Lenna Gilford MD, Deborra Medina   . Cervical spondylosis   . Chronic  pain syndrome 03/03/2019  . Colon polyp 12/19/2009   Transverse-polypoid colorectal mucosa  . Constipation   . Cough 10/21/2014  . Degenerative joint disease   . Diastolic dysfunction   . Diverticula of colon   . Diverticulosis of colon 05/24/2002   Qualifier: Diagnosis of  By: Jerral Ralph    . Gastritis, chronic   . Hiatal hernia   . Hypertension   . Low back pain   . LPRD (laryngopharyngeal reflux disease) 04/04/2014  . Osteoporosis 05/22/2020  . RBBB (right bundle branch block with left anterior fascicular block)   . Renal cyst   . Venous insufficiency    Past Surgical History:  Procedure Laterality Date  . CARPAL TUNNEL RELEASE    . Left mastectomy      Allergies  Allergen Reactions  . Fentanyl Nausea Only and Other (See Comments)    Pt states that she is not allergic    . Naprosyn [Naproxen] Other (See Comments)    Patient felt tongue and face swelling. Went to ED where no visible swelling noted    Allergies as of 10/09/2020      Reactions   Fentanyl Nausea Only, Other (See Comments)   Pt states that she is not allergic     Naprosyn [naproxen] Other (See Comments)   Patient felt tongue and face swelling. Went to ED where no visible swelling noted      Medication List       Accurate as of October 09, 2020 11:25 AM. If you have any questions, ask your nurse or doctor.        STOP taking these medications   albuterol 108 (90 Base) MCG/ACT inhaler Commonly known as: VENTOLIN HFA Stopped by: Nelda Bucks Ashle Stief, NP   erythromycin ophthalmic ointment Stopped by: Nelda Bucks Hilliard Borges, NP   tobramycin 0.3 % ophthalmic solution Commonly known as: TOBREX Stopped by: Sandrea Hughs, NP     TAKE these medications   acetaminophen 500 MG tablet Commonly known as: TYLENOL Take 500 mg by mouth every 6 (six) hours as needed for moderate pain or headache.   ALPRAZolam 0.25 MG tablet Commonly known as: XANAX Take 1 tablet (0.25 mg total) by mouth 2 (two) times daily  as needed. for anxiety   carvedilol 3.125 MG tablet Commonly known as: COREG Take 1 tablet (3.125 mg total) by mouth 2 (two) times daily.   cholecalciferol 1000 units tablet Commonly known as: VITAMIN D Take 1,000 Units by mouth daily.   famotidine 20 MG tablet Commonly known as: PEPCID Take 1 tablet by mouth once daily   feeding supplement Liqd Take 237 mLs by mouth 2 (two) times daily between meals.   furosemide 20 MG tablet Commonly known as: LASIX TAKE 1/2 (ONE-HALF) TABLET BY MOUTH EVERY OTHER DAY   levocetirizine 5 MG tablet Commonly known as: XYZAL Take 5 mg by mouth daily.   MiraLax 17 g packet Generic drug: polyethylene glycol Take 17 g by mouth 2 (two) times daily. What changed: Another medication with the same name was removed. Continue taking this medication, and follow  the directions you see here. Changed by: Sandrea Hughs, NP   multivitamins ther. w/minerals Tabs tablet Take 1 tablet by mouth daily.   sennosides-docusate sodium 8.6-50 MG tablet Commonly known as: SENOKOT-S Take 1 tablet by mouth daily.   spironolactone 25 MG tablet Commonly known as: ALDACTONE Take 1 tablet (25 mg total) by mouth daily.   SYSTANE COMPLETE OP Apply 1 drop to eye daily as needed (dry eyes).       Review of Systems  Constitutional: Positive for fatigue and unexpected weight change. Negative for chills and fever.       Weight loss  HENT: Positive for dental problem, hearing loss and rhinorrhea. Negative for ear pain and sore throat.        Allergies  Wears dentures   Eyes: Negative for discharge, redness and itching.       Dry eyes,painful and itchy eyes   Respiratory: Negative for cough, chest tightness, shortness of breath and wheezing.   Cardiovascular: Positive for leg swelling. Negative for chest pain and palpitations.  Gastrointestinal: Positive for abdominal pain, constipation and diarrhea. Negative for abdominal distention, nausea and vomiting.        Hiatal hernia  Black stool   Endocrine: Negative for cold intolerance, heat intolerance, polydipsia, polyphagia and polyuria.  Genitourinary: Positive for dysuria, frequency and urgency. Negative for difficulty urinating and flank pain.  Musculoskeletal: Positive for arthralgias, back pain and gait problem. Negative for joint swelling, myalgias and neck pain.       Joint stiffness   Skin: Negative for color change, pallor and rash.  Neurological: Positive for dizziness and weakness. Negative for seizures, speech difficulty, light-headedness, numbness and headaches.  Hematological: Does not bruise/bleed easily.  Psychiatric/Behavioral: Positive for confusion. Negative for agitation, behavioral problems and sleep disturbance. The patient is nervous/anxious.     Immunization History  Administered Date(s) Administered  . Influenza,inj,Quad PF,6+ Mos 09/18/2015   Pertinent  Health Maintenance Due  Topic Date Due  . PNA vac Low Risk Adult (1 of 2 - PCV13) Never done  . INFLUENZA VACCINE  06/17/2020  . DEXA SCAN  Completed   Fall Risk  10/09/2020 05/17/2020 04/30/2020 02/22/2020 02/03/2020  Falls in the past year? 0 1 0 0 0  Number falls in past yr: 0 0 - 0 0  Injury with Fall? 0 0 - 0 0  Risk for fall due to : - - - - -  Follow up - - - - -   Functional Status Survey:    Vitals:   10/09/20 1103  BP: 110/60  Pulse: 83  Resp: 16  Temp: 98.7 F (37.1 C)  SpO2: 98%  Weight: 111 lb 6.4 oz (50.5 kg)  Height: _0  (1.549 m)   Body mass index is 21.05 kg/m. Physical Exam Vitals reviewed.  Constitutional:      General: She is not in acute distress.    Appearance: She is normal weight. She is not ill-appearing.  HENT:     Head: Normocephalic.     Right Ear: There is impacted cerumen.     Left Ear: There is impacted cerumen.     Ears:     Comments: Bilateral ear cerumen lavaged with warm water mixed with hydrogen peroxide and additional small amounts of cerumen removed with alligator  forceps.TM clear.Tolareted procedure well.     Nose: Nose normal. No congestion or rhinorrhea.     Mouth/Throat:     Mouth: Mucous membranes are moist.  Pharynx: Oropharynx is clear. No oropharyngeal exudate or posterior oropharyngeal erythema.  Eyes:     General:        Right eye: No discharge.        Left eye: No discharge.     Conjunctiva/sclera: Conjunctivae normal.     Pupils: Pupils are equal, round, and reactive to light.  Neck:     Vascular: No carotid bruit.  Cardiovascular:     Rate and Rhythm: Normal rate and regular rhythm.     Pulses: Normal pulses.     Heart sounds: Normal heart sounds. No murmur heard.  No friction rub. No gallop.   Pulmonary:     Effort: Pulmonary effort is normal. No respiratory distress.     Breath sounds: Normal breath sounds. No wheezing, rhonchi or rales.  Chest:     Chest wall: No tenderness.  Abdominal:     General: Bowel sounds are normal. There is no distension.     Palpations: Abdomen is soft. There is no mass.     Tenderness: There is no abdominal tenderness. There is no right CVA tenderness, left CVA tenderness, guarding or rebound.  Musculoskeletal:        General: No swelling or tenderness.     Cervical back: No rigidity or tenderness. Decreased range of motion.     Right lower leg: Edema present.     Left lower leg: Edema present.     Comments: Severe thoracic and cervical spine curvature  Unsteady gait ambulates with Rolling walker   Skin:    General: Skin is warm and dry.     Coloration: Skin is not pale.     Findings: No bruising, erythema or rash.     Comments: Left sacral area previous pressure ulcer site pale whitish pink in color,non-tender and has no drainage noted   Neurological:     Mental Status: She is alert.     Cranial Nerves: No cranial nerve deficit.     Sensory: No sensory deficit.     Motor: No weakness.     Coordination: Coordination normal.     Gait: Gait abnormal.  Psychiatric:        Mood and  Affect: Mood is anxious.        Speech: Speech normal.        Cognition and Memory: Memory is impaired.     Comments: Very talkative and repeating self during visit tells son that she knows her body more than anyone else and does not allow him to contribute to history.      Labs reviewed: Recent Labs    02/17/20 1700 02/17/20 1700 07/31/20 1207 07/31/20 1405 08/14/20 1701  NA 140  --  141  --  141  K 4.0   < > 6.2* 5.1 3.9  CL 103  --  103  --  105  CO2 26  --  27  --  28  GLUCOSE 96  --  92  --  90  BUN 21  --  20  --  21  CREATININE 1.15*  --  1.05*  --  1.00  CALCIUM 9.2  --  9.2  --  8.8*   < > = values in this interval not displayed.   Recent Labs    02/17/20 1700 07/31/20 1207 08/14/20 1701  AST 27 33 22  ALT _0 ALKPHOS 35* 34* 29*  BILITOT 0.8 1.0 0.6  PROT 6.5 6.8 6.3*  ALBUMIN 3.7 3.8 3.5  Recent Labs    02/17/20 1700 07/31/20 1207 08/14/20 1701  WBC 5.2 4.1 5.1  NEUTROABS 2.6 1.8 2.7  HGB 13.2 14.0 12.8  HCT 41.1 44.7 39.5  MCV 96.0 98.7 96.3  PLT 195 213 184   Lab Results  Component Value Date   TSH 2.67 11/30/2014   No results found for: HGBA1C Lab Results  Component Value Date   CHOL 199 02/02/2014   HDL 98.70 02/02/2014   LDLCALC 96 02/02/2014   TRIG 20.0 02/02/2014   CHOLHDL 2 02/02/2014    Significant Diagnostic Results in last 30 days:  No results found.  Assessment/Plan 1. Symptoms of urinary tract infection Afebrile.dysuria,urgency with frequent urination used the bathroom twice during visit. - POC Urinalysis Dipstick urine cloudy,postive for blood and trace leukocytes.Negative for nitrites   - Culture, Urine  2. Hypertensive heart disease with chronic diastolic congestive heart failure (HCC) B/p well controlled. Continue on spironolactone,coreg and Furosemide  - CBC with Differential/Platelet - CMP with eGFR([facility]) - TSH  3. Chronic diastolic heart failure (HCC) No signs of fluid overload.Has had on 3.6 lbs  weight loss over two months.   4. Hiatal hernia with GERD without esophagitis Symptoms controlled. - continue famotidine  - CBC/diff   5. Generalized anxiety disorder Easily agitated with son during visit when he attempted to provide patient's history. - continue on Alprazolam 0.25 mg tablet twice daily as needed.   6. Slow transit constipation Continue on senokot- S  - encouraged to increase vegetables in diet and hydration.  7. Fatigue, unspecified type Reports feeling tired sometimes has to sit  - Vitamin B12 - TSH level   8. Bilateral impacted cerumen Bilateral ear cerumen lavaged with warm water mixed with hydrogen peroxide and additional small amounts of cerumen removed with alligator forceps.TM clear.Tolareted procedure well.   9. Primary osteoarthritis involving multiple joints Continue on tylenol as needed   10. Impaired skin integrity Left sacral area previous pressure ulcer site pale whitish pink in color,non-tender and has no drainage noted  - encouraged to drink protein supplement twice daily  - zinc oxide (BALMEX) 11.3 % CREA cream; Apply 1 application topically 2 (two) times daily.  Dispense: 56 g; Refill: 1  11. Vitamin D deficiency Continue on Vitamin D supplement  - Vitamin D, 1,25-dihydroxy . Family/ staff Communication: Reviewed plan of care with patient and son   Labs/tests ordered:  - Culture, Urine - Vitamin D, 1,25-dihydroxy - Vitamin B12 - TSH level  - CBC with Differential/Platelet - CMP with eGFR([facility]) - TSH  Next Appointment : 6 months for medical management of chronic issues.   Sandrea Hughs, NP

## 2020-10-10 DIAGNOSIS — R399 Unspecified symptoms and signs involving the genitourinary system: Secondary | ICD-10-CM | POA: Diagnosis not present

## 2020-10-12 LAB — COMPLETE METABOLIC PANEL WITH GFR
AG Ratio: 1.6 (calc) (ref 1.0–2.5)
ALT: 14 U/L (ref 6–29)
AST: 25 U/L (ref 10–35)
Albumin: 4.1 g/dL (ref 3.6–5.1)
Alkaline phosphatase (APISO): 34 U/L — ABNORMAL LOW (ref 37–153)
BUN/Creatinine Ratio: 23 (calc) — ABNORMAL HIGH (ref 6–22)
BUN: 24 mg/dL (ref 7–25)
CO2: 33 mmol/L — ABNORMAL HIGH (ref 20–32)
Calcium: 9.7 mg/dL (ref 8.6–10.4)
Chloride: 101 mmol/L (ref 98–110)
Creat: 1.05 mg/dL — ABNORMAL HIGH (ref 0.60–0.88)
GFR, Est African American: 50 mL/min/{1.73_m2} — ABNORMAL LOW (ref >=60)
GFR, Est Non African American: 44 mL/min/{1.73_m2} — ABNORMAL LOW (ref >=60)
Globulin: 2.6 g/dL (calc) (ref 1.9–3.7)
Glucose, Bld: 80 mg/dL (ref 65–139)
Potassium: 4.2 mmol/L (ref 3.5–5.3)
Sodium: 141 mmol/L (ref 135–146)
Total Bilirubin: 0.8 mg/dL (ref 0.2–1.2)
Total Protein: 6.7 g/dL (ref 6.1–8.1)

## 2020-10-12 LAB — TSH: TSH: 3.14 mIU/L (ref 0.40–4.50)

## 2020-10-12 LAB — CBC WITH DIFFERENTIAL/PLATELET
Absolute Monocytes: 615 cells/uL (ref 200–950)
Basophils Absolute: 40 cells/uL (ref 0–200)
Basophils Relative: 0.8 %
Eosinophils Absolute: 50 cells/uL (ref 15–500)
Eosinophils Relative: 1 %
HCT: 39.6 % (ref 35.0–45.0)
Hemoglobin: 13 g/dL (ref 11.7–15.5)
Lymphs Abs: 1625 cells/uL (ref 850–3900)
MCH: 30.7 pg (ref 27.0–33.0)
MCHC: 32.8 g/dL (ref 32.0–36.0)
MCV: 93.4 fL (ref 80.0–100.0)
MPV: 9.7 fL (ref 7.5–12.5)
Monocytes Relative: 12.3 %
Neutro Abs: 2670 cells/uL (ref 1500–7800)
Neutrophils Relative %: 53.4 %
Platelets: 221 10*3/uL (ref 140–400)
RBC: 4.24 10*6/uL (ref 3.80–5.10)
RDW: 12.3 % (ref 11.0–15.0)
Total Lymphocyte: 32.5 %
WBC: 5 10*3/uL (ref 3.8–10.8)

## 2020-10-12 LAB — VITAMIN D 1,25 DIHYDROXY
Vitamin D 1, 25 (OH)2 Total: 54 pg/mL (ref 18–72)
Vitamin D2 1, 25 (OH)2: <8 pg/mL
Vitamin D3 1, 25 (OH)2: 54 pg/mL

## 2020-10-12 LAB — URINE CULTURE
MICRO NUMBER:: 11246228
SPECIMEN QUALITY:: ADEQUATE

## 2020-10-12 LAB — VITAMIN B12: Vitamin B-12: 445 pg/mL (ref 200–1100)

## 2020-10-30 ENCOUNTER — Ambulatory Visit (INDEPENDENT_AMBULATORY_CARE_PROVIDER_SITE_OTHER): Payer: Medicare Other | Admitting: Family

## 2020-10-30 ENCOUNTER — Other Ambulatory Visit: Payer: Self-pay

## 2020-10-30 ENCOUNTER — Encounter: Payer: Self-pay | Admitting: Family

## 2020-10-30 VITALS — BP 120/70 | HR 83 | Temp 98.0°F | Resp 16 | Ht 61.0 in | Wt 107.4 lb

## 2020-10-30 DIAGNOSIS — M545 Low back pain, unspecified: Secondary | ICD-10-CM | POA: Diagnosis not present

## 2020-10-30 DIAGNOSIS — G8929 Other chronic pain: Secondary | ICD-10-CM

## 2020-10-30 DIAGNOSIS — R399 Unspecified symptoms and signs involving the genitourinary system: Secondary | ICD-10-CM | POA: Diagnosis not present

## 2020-10-30 DIAGNOSIS — R1032 Left lower quadrant pain: Secondary | ICD-10-CM

## 2020-10-30 DIAGNOSIS — R600 Localized edema: Secondary | ICD-10-CM

## 2020-10-30 DIAGNOSIS — H9193 Unspecified hearing loss, bilateral: Secondary | ICD-10-CM

## 2020-10-30 LAB — POCT URINALYSIS DIPSTICK
Bilirubin, UA: NEGATIVE
Glucose, UA: NEGATIVE
Nitrite, UA: NEGATIVE
Protein, UA: POSITIVE — AB
Spec Grav, UA: 1.025 (ref 1.010–1.025)
Urobilinogen, UA: NEGATIVE E.U./dL — AB
pH, UA: 5 (ref 5.0–8.0)

## 2020-10-30 MED ORDER — LIDOCAINE 5 % EX PTCH: 1.0000 | MEDICATED_PATCH | CUTANEOUS | 0 refills | Status: DC

## 2020-10-30 NOTE — Patient Instructions (Addendum)
Thigh high compression Stockings wear in the morning and take off at bedtime for leg swelling. - Keep legs elevated above heart level daily when seating.

## 2020-10-30 NOTE — Progress Notes (Addendum)
Provider: Marlowe Sax FNP-C  Veronda Gabor, Nelda Bucks, NP  Patient Care Team: Bevin Mayall, Nelda Bucks, NP as PCP - General (Family Medicine) Josue Hector, MD as PCP - Cardiology (Cardiology) Peggye Form, MD as Referring Physician (Internal Medicine) Garald Balding, MD as Attending Physician (Orthopedic Surgery) Debbra Riding, MD as Consulting Physician (Ophthalmology) Noralee Space, MD as Consulting Physician (Pulmonary Disease)  Extended Emergency Contact Information Primary Emergency Contact: Geffert,Michael Address: [place], [place] [address] Johnnette Litter of [place] Phone: [phone] Relation: Son Secondary Emergency Contact: Chrissie Noa Mobile Phone: [phone] Relation: Other Preferred language: Cleophus Molt Interpreter needed? No  Code Status:  Full Code  Goals of care: Advanced Directive information Advanced Directives 10/30/2020  Does Patient Have a Medical Advance Directive? No  Type of Advance Directive -  Does patient want to make changes to medical advance directive? No - Patient declined  Copy of [place] in Chart? -  Would patient like information on creating a medical advance directive? -     Chief Complaint  Patient presents with  . Acute Visit    Complains of stomach pain.    HPI:  Pt is a 86 y.o. female seen today for an acute visit for evaluation of stomach pain.she is here with the son.Son states stomach pain ongoing for about 6-9 yrs but seems to be getting worst.Pain is located on left lower side.Has constipation sometimes but takes stool softeners which are effective.  No nausea ,vomitting or diarrhea.Also denies any fever or chills. Previous CT scan done 11/07/2019     Past Medical History:  Diagnosis Date  . Abdominal pain, left lower quadrant 06/22/2013  . Abnormal CT scan of lung 07/16/2015  . Allergic rhinitis, cause unspecified 02/21/2014  . Anxiety state, unspecified   . Breast cancer  ([place])    NO STICK OR BLOOD PRESSURE CHECKS IN LEFT ARM  . Carpal tunnel syndrome   . Carpal tunnel syndrome on both sides 10/03/2008   Qualifier: Diagnosis of  By: Lenna Gilford MD, Deborra Medina   . Cervical spondylosis   . Chronic pain syndrome 03/03/2019  . Colon polyp 12/19/2009   Transverse-polypoid colorectal mucosa  . Constipation   . Cough 10/21/2014  . Degenerative joint disease   . Diastolic dysfunction   . Diverticula of colon   . Diverticulosis of colon 05/24/2002   Qualifier: Diagnosis of  By: Jerral Ralph    . Gastritis, chronic   . Hiatal hernia   . Hypertension   . Low back pain   . LPRD (laryngopharyngeal reflux disease) 04/04/2014  . Osteoporosis 05/22/2020  . RBBB (right bundle branch block with left anterior fascicular block)   . Renal cyst   . Venous insufficiency    Past Surgical History:  Procedure Laterality Date  . CARPAL TUNNEL RELEASE    . Left mastectomy      Allergies  Allergen Reactions  . Fentanyl Nausea Only and Other (See Comments)    Pt states that she is not allergic    . Naprosyn [Naproxen] Other (See Comments)    Patient felt tongue and face swelling. Went to ED where no visible swelling noted    Outpatient Encounter Medications as of 10/30/2020  Medication Sig  . acetaminophen (TYLENOL) 500 MG tablet Take 500 mg by mouth every 6 (six) hours as needed for moderate pain or headache.  . ALPRAZolam (XANAX) 0.25 MG tablet Take 1 tablet (0.25 mg total)  by mouth 2 (two) times daily as needed. for anxiety  . carvedilol (COREG) 3.125 MG tablet Take 1 tablet (3.125 mg total) by mouth 2 (two) times daily.  . cholecalciferol (VITAMIN D) 1000 units tablet Take 1,000 Units by mouth daily.  . famotidine (PEPCID) 20 MG tablet Take 1 tablet by mouth once daily  . feeding supplement, ENSURE ENLIVE, (ENSURE ENLIVE) LIQD Take 237 mLs by mouth 2 (two) times daily between meals.  . furosemide (LASIX) 20 MG tablet TAKE 1/2 (ONE-HALF) TABLET BY MOUTH EVERY OTHER DAY   . levocetirizine (XYZAL) 5 MG tablet Take 5 mg by mouth daily.   . Multiple Vitamins-Minerals (MULTIVITAMINS THER. W/MINERALS) TABS Take 1 tablet by mouth daily.  . polyethylene glycol (MIRALAX / GLYCOLAX) 17 g packet Take 17 g by mouth 2 (two) times daily.  Marland Kitchen Propylene Glycol (SYSTANE COMPLETE OP) Apply 1 drop to eye daily as needed (dry eyes).  . sennosides-docusate sodium (SENOKOT-S) 8.6-50 MG tablet Take 1 tablet by mouth daily.  Marland Kitchen spironolactone (ALDACTONE) 25 MG tablet Take 1 tablet (25 mg total) by mouth daily.  Marland Kitchen zinc oxide (BALMEX) 11.3 % CREA cream Apply 1 application topically 2 (two) times daily.   No facility-administered encounter medications on file as of 10/30/2020.    Review of Systems  Constitutional: Negative for appetite change, chills, fatigue and fever.  Respiratory: Negative for cough, chest tightness, shortness of breath and wheezing.   Cardiovascular: Negative for chest pain, palpitations and leg swelling.  Gastrointestinal: Positive for abdominal pain. Negative for abdominal distention, constipation, diarrhea, nausea and vomiting.  Musculoskeletal: Positive for arthralgias, back pain and gait problem.  Skin: Negative for color change, pallor and rash.  Neurological: Negative for dizziness, seizures, speech difficulty, light-headedness, numbness and headaches.    Immunization History  Administered Date(s) Administered  . Influenza, High Dose Seasonal PF 08/21/2015  . Influenza,inj,Quad PF,6+ Mos 09/18/2015  . PFIZER SARS-COV-2 Vaccination 04/18/2020, 05/10/2020  . Pneumococcal Polysaccharide-23 02/07/2015   Pertinent  Health Maintenance Due  Topic Date Due  . INFLUENZA VACCINE  02/14/2021 (Originally 06/17/2020)  . PNA vac Low Risk Adult (2 of 2 - PCV13) 10/09/2021 (Originally 02/07/2016)  . DEXA SCAN  Completed   Fall Risk  10/30/2020 10/09/2020 05/17/2020 04/30/2020 02/22/2020  Falls in the past year? 0 0 1 0 0  Number falls in past yr: 0 0 0 - 0  Injury with  Fall? 0 0 0 - 0  Risk for fall due to : - - - - -  Follow up - - - - -   Functional Status Survey:    Vitals:   10/30/20 1506  BP: 120/70  Pulse: 83  Resp: 16  Temp: 98 F (36.7 C)  SpO2: 98%  Weight: 107 lb 6.4 oz (48.7 kg)  Height: 5\' 1"  (1.549 m)   Body mass index is 20.29 kg/m. Physical Exam Vitals reviewed.  Constitutional:      General: She is not in acute distress.    Appearance: She is normal weight. She is not ill-appearing.  Cardiovascular:     Rate and Rhythm: Normal rate and regular rhythm.     Pulses: Normal pulses.     Heart sounds: Normal heart sounds. No friction rub. No gallop.   Pulmonary:     Effort: Pulmonary effort is normal. No respiratory distress.     Breath sounds: Normal breath sounds. No wheezing or rales.  Chest:     Chest wall: No tenderness.  Abdominal:  General: Bowel sounds are normal. There is no distension.     Palpations: There is no mass.     Tenderness: There is abdominal tenderness in the left lower quadrant. There is no right CVA tenderness, left CVA tenderness, guarding or rebound.  Musculoskeletal:        General: No swelling or tenderness.     Right lower leg: Edema present.     Left lower leg: Edema present.     Comments: Unsteady gait ambulates with a walker.   Skin:    General: Skin is warm and dry.     Coloration: Skin is not pale.     Findings: No bruising, erythema or rash.  Neurological:     Mental Status: She is oriented to person, place, and time.     Cranial Nerves: No cranial nerve deficit.     Motor: No weakness.     Gait: Gait abnormal.  Psychiatric:        Mood and Affect: Mood normal.        Behavior: Behavior normal.        Thought Content: Thought content normal.        Judgment: Judgment normal.     Labs reviewed: Recent Labs    07/31/20 1207 07/31/20 1405 08/14/20 1701 10/09/20 1339  NA 141  --  141 141  K 6.2* 5.1 3.9 4.2  CL 103  --  105 101  CO2 27  --  28 33*  GLUCOSE 92  --  90  80  BUN 20  --  21 24  CREATININE 1.05*  --  1.00 1.05*  CALCIUM 9.2  --  8.8* 9.7   Recent Labs    02/17/20 1700 07/31/20 1207 08/14/20 1701 10/09/20 1339  AST 27 33 22 25  ALT 21 16 15 14   ALKPHOS 35* 34* 29*  --   BILITOT 0.8 1.0 0.6 0.8  PROT 6.5 6.8 6.3* 6.7  ALBUMIN 3.7 3.8 3.5  --    Recent Labs    07/31/20 1207 08/14/20 1701 10/09/20 1339  WBC 4.1 5.1 5.0  NEUTROABS 1.8 2.7 2,670  HGB 14.0 12.8 13.0  HCT 44.7 39.5 39.6  MCV 98.7 96.3 93.4  PLT 213 184 221   Lab Results  Component Value Date   TSH 3.14 10/09/2020   No results found for: HGBA1C Lab Results  Component Value Date   CHOL 199 02/02/2014   HDL 98.70 02/02/2014   LDLCALC 96 02/02/2014   TRIG 20.0 02/02/2014   CHOLHDL 2 02/02/2014    Significant Diagnostic Results in last 30 days:  No results found.  Assessment/Plan 1. Symptoms of urinary tract infection Afebrile.left lower Quadrant tenderness to palpation. - POC Urinalysis Dipstick indicates yellow cloudy urine positive large blood,leukocytes and protein but negative for Nitrites.  - Culture, Urine  2. Left lower quadrant abdominal pain Ongoing but has worsen recently. LLQ tender to palpation. - Previous CT Abdomen reviewed negative 11/07/2019 - Ambulatory referral to Gastroenterology  3. Edema of both lower extremities No signs of fluid overload. - Encouraged to keep legs elevated when seated. - Compression stockings: thigh high on in the morning and off at bedtime.  - For home use only DME Other see comment: Electric lift chair to assist with transfer in and out of chair and elevate legs to keep swelling down.   4. Chronic bilateral low back pain without sciatica Chronic. - lidocaine (LIDODERM) 5 %; Place 1 patch onto the skin daily. Remove & Discard  patch within 12 hours or as directed by MD  Dispense: 30 patch; Refill: 0 - For home use only DME Other see comment: Electric lift chair to assist in getting in and out of the chair.    5. Hard of Hearing  Worsening hearing request hearing aids.   Family/ staff Communication: Reviewed plan of care with patient  Labs/tests ordered: None   Next Appointment: Has appointment already.   Sandrea Hughs, NP

## 2020-10-30 NOTE — Addendum Note (Signed)
Addended byMarlowe Sax C on: 10/30/2020 05:11 PM   Modules accepted: Orders, Level of Service

## 2020-10-31 LAB — URINE CULTURE
MICRO NUMBER:: 11316940
Result:: NO GROWTH
SPECIMEN QUALITY:: ADEQUATE

## 2020-11-01 ENCOUNTER — Telehealth: Payer: Self-pay

## 2020-11-01 NOTE — Telephone Encounter (Signed)
Incoming call received from Heather [name] questioning the status of prior authorization for Lidocaine patch. Heather [name] states when he went to pick up rx the pharmacy stated rx needs prior auth and they have faxed [place] the information.  Heather [name] is aware that we do not currently have a PA in process and I will check faxes and return his call shortly.

## 2020-11-01 NOTE — Telephone Encounter (Signed)
Faxes checked and no fax received for prior authorization for patient.  [name] will have pharmacy refaxed the Prior authorization.

## 2020-11-05 NOTE — Telephone Encounter (Signed)
Prior Authorization faxed to [facility] Awaiting Determination.

## 2020-11-05 NOTE — Telephone Encounter (Signed)
Received Prior Authorization from [employer] [place] #[id number] for Lidocaine Patches. Filled out Prior Auth and placed in Dinah's folder to review and sign.  To be faxed back to [facility] Fax: [phone]  Patient ID: [id number]

## 2020-11-06 NOTE — Telephone Encounter (Signed)
Heather [name] with [employer] called stating the patient's Rx was denied, patient has been notified and will be getting it through [employer] from [employer].

## 2020-11-08 ENCOUNTER — Other Ambulatory Visit: Payer: Self-pay | Admitting: Family Medicine

## 2020-11-08 DIAGNOSIS — K5900 Constipation, unspecified: Secondary | ICD-10-CM | POA: Diagnosis not present

## 2020-11-08 DIAGNOSIS — R1032 Left lower quadrant pain: Secondary | ICD-10-CM | POA: Diagnosis not present

## 2020-11-23 DIAGNOSIS — H903 Sensorineural hearing loss, bilateral: Secondary | ICD-10-CM | POA: Diagnosis not present

## 2020-12-07 ENCOUNTER — Other Ambulatory Visit (INDEPENDENT_AMBULATORY_CARE_PROVIDER_SITE_OTHER): Payer: Medicare Other

## 2020-12-07 ENCOUNTER — Ambulatory Visit: Payer: Medicare Other | Admitting: Nurse Practitioner

## 2020-12-07 ENCOUNTER — Encounter: Payer: Self-pay | Admitting: Nurse Practitioner

## 2020-12-07 VITALS — BP 110/60 | HR 81 | Ht <= 58 in | Wt 117.6 lb

## 2020-12-07 DIAGNOSIS — R1032 Left lower quadrant pain: Secondary | ICD-10-CM | POA: Diagnosis not present

## 2020-12-07 LAB — COMPREHENSIVE METABOLIC PANEL
ALT: 18 U/L (ref 0–35)
AST: 27 U/L (ref 0–37)
Albumin: 4.3 g/dL (ref 3.5–5.2)
Alkaline Phosphatase: 33 U/L — ABNORMAL LOW (ref 39–117)
BUN: 21 mg/dL (ref 6–23)
CO2: 33 mEq/L — ABNORMAL HIGH (ref 19–32)
Calcium: 9.7 mg/dL (ref 8.4–10.5)
Chloride: 103 mEq/L (ref 96–112)
Creatinine, Ser: 1.06 mg/dL (ref 0.40–1.20)
GFR: 43.12 mL/min — ABNORMAL LOW (ref >60.00)
Glucose, Bld: 95 mg/dL (ref 70–99)
Potassium: 4.1 mEq/L (ref 3.5–5.1)
Sodium: 140 mEq/L (ref 135–145)
Total Bilirubin: 0.8 mg/dL (ref 0.2–1.2)
Total Protein: 7 g/dL (ref 6.0–8.3)

## 2020-12-07 LAB — CBC WITH DIFFERENTIAL/PLATELET
Basophils Absolute: 0 10*3/uL (ref 0.0–0.1)
Basophils Relative: 0.9 % (ref 0.0–3.0)
Eosinophils Absolute: 0 10*3/uL (ref 0.0–0.7)
Eosinophils Relative: 0.8 % (ref 0.0–5.0)
HCT: 42.1 % (ref 36.0–46.0)
Hemoglobin: 13.7 g/dL (ref 12.0–15.0)
Lymphocytes Relative: 30.6 % (ref 12.0–46.0)
Lymphs Abs: 1.6 10*3/uL (ref 0.7–4.0)
MCHC: 32.5 g/dL (ref 30.0–36.0)
MCV: 94 fl (ref 78.0–100.0)
Monocytes Absolute: 0.7 10*3/uL (ref 0.1–1.0)
Monocytes Relative: 12.5 % — ABNORMAL HIGH (ref 3.0–12.0)
Neutro Abs: 2.9 10*3/uL (ref 1.4–7.7)
Neutrophils Relative %: 55.2 % (ref 43.0–77.0)
Platelets: 168 10*3/uL (ref 150.0–400.0)
RBC: 4.47 Mil/uL (ref 3.87–5.11)
RDW: 14.1 % (ref 11.5–15.5)
WBC: 5.3 10*3/uL (ref 4.0–10.5)

## 2020-12-07 NOTE — Patient Instructions (Signed)
If you are age 85 or older, your body mass index should be between 23-30. Your Body mass index is 24.58 kg/m. If this is out of the aforementioned range listed, please consider follow up with your Primary Care Provider.  If you are age 60 or younger, your body mass index should be between 19-25. Your Body mass index is 24.58 kg/m. If this is out of the aformentioned range listed, please consider follow up with your Primary Care Provider.   LABS: Your provider has requested that you go to the basement level for lab work before leaving today. Press "B" on the elevator. The lab is located at the first door on the left as you exit the elevator.  HEALTHCARE LAWS AND MY CHART RESULTS: Due to recent changes in healthcare laws, you may see the results of your imaging and laboratory studies on MyChart before your provider has had a chance to review them.  We understand that in some cases there may be results that are confusing or concerning to you. Not all laboratory results come back in the same time frame and the provider may be waiting for multiple results in order to interpret others.  Please give [place] 48 hours in order for your provider to thoroughly review all the results before contacting the office for clarification of your results.   We have given you samples of the following medication to take: IbGard, take 1 capsule daily. You can purchase move over the counter.  Continue using Miralax twice a day and add Senokot, 1-2 tablets every two -three days as needed.  Please follow up with your primary care provider for chronic pain management.  It was great seeing you today!  Thank you for entrusting me with your care and choosing [facility].  Noralyn Pick, CRNP

## 2020-12-07 NOTE — Progress Notes (Addendum)
12/08/2020 [name] [id number] 1918/10/03   Chief Complaint: LLQ abdominal pain   History of Present Illness:  Heather [name] is a 87 year old female with a past medical history of breast cancer, hypertension, diastolic CHF, right bundle branch block, diverticulosis and colon polyps.  She was initially seen in our office 10/27/2019 by Heather [name] for further evaluation regarding left lower quadrant abdominal pain. History of left lower quadrant abdominal pain dating back to 2016 which included multiple CT scans without identifying a cause for her pain. Her most recent abdominal/pelvic CT scan with contrast was completed on 08/14/2020 which showed a moderate amount of stool burden throughout the colon, the transverse coon was tortuous, no evidence of a bowel obstruction or mass. She presents to our office today with complaints of LLQ pain which she stated is the same LLQ pain she has experienced daily for the past year. She is taking Miralax bid. She takes 1 or 2 Senokot tabs if she does not pass a BM for 1 day. No rectal bleeding. Her LLQ pain does not improve after defecation. Her LLQ pain is somewhat positional and is noticeably worse when standing. Her appetite is good. No weight loss. No fever, sweats or chills. She underwent a colonoscopy by Dr. Fuller Plan 12/18/2009 which identified diverticulosis to the ascending and sigmoid colon and one polyp was removed from the transverse colon, biopsies showed polypoid colonic mucosa and not a true colon polyp. GERD symptoms are well controlled on Pepcid 20mg  QD. She lives alone in her own house. Her son lives nearby.   Labs 10/09/2020: WBC 5.0. Hg 13. HCT 39.6. [id number]. BUN 24. Cr. 1.05. Ak phos 34. AST 25. ALT 14.  CTAP with contrast 08/14/2020:  Stomach/Bowel: Decompressed stomach. No small bowel obstruction, administered enteric contrast reaches the mid distal small bowel loops. There is no small bowel dilatation. The appendix is  normal. Moderate stool burden throughout the colon. Transverse colon is tortuous. Air in stool in the rectum without rectal wall thickening. There is no bowel inflammation. 1. No acute abnormality in the abdomen/pelvis. 2. Moderate colonic stool burden, can be seen with constipation. Stool burden is decreased from December exam. 3. Additional stable chronic findings as described. Aortic Atherosclerosis   Colonoscopy by Dr. Fuller Plan 12/18/2009: Moderate diverticulosis to the ascending and sigmoid colon A 107mm sessile polyp in the distal transverse colon. Biopsies showed polypoid colonic mucosa.  Internal hemorrhoids.   Current Outpatient Medications on File Prior to Visit  Medication Sig Dispense Refill  . acetaminophen (TYLENOL) 500 MG tablet Take 500 mg by mouth every 6 (six) hours as needed for moderate pain or headache.    . ALPRAZolam (XANAX) 0.25 MG tablet Take 1 tablet (0.25 mg total) by mouth 2 (two) times daily as needed. for anxiety 30 tablet 0  . carvedilol (COREG) 3.125 MG tablet Take 1 tablet (3.125 mg total) by mouth 2 (two) times daily. 180 tablet 0  . cholecalciferol (VITAMIN D) 1000 units tablet Take 1,000 Units by mouth daily.    . famotidine (PEPCID) 20 MG tablet Take 1 tablet by mouth once daily 30 tablet 3  . feeding supplement, ENSURE ENLIVE, (ENSURE ENLIVE) LIQD Take 237 mLs by mouth 2 (two) times daily between meals. 237 mL 12  . furosemide (LASIX) 20 MG tablet TAKE 1/2 (ONE-HALF) TABLET BY MOUTH EVERY OTHER DAY 30 tablet 0  . levocetirizine (XYZAL) 5 MG tablet Take 5 mg by mouth daily.   2  . Multiple Vitamins-Minerals (  MULTIVITAMINS THER. W/MINERALS) TABS Take 1 tablet by mouth daily.    . polyethylene glycol (MIRALAX / GLYCOLAX) 17 g packet Take 17 g by mouth 2 (two) times daily.    Marland Kitchen Propylene Glycol (SYSTANE COMPLETE OP) Apply 1 drop to eye daily as needed (dry eyes).    . sennosides-docusate sodium (SENOKOT-S) 8.6-50 MG tablet Take 1-2 tablets by mouth daily.     Marland Kitchen spironolactone (ALDACTONE) 25 MG tablet Take 1 tablet (25 mg total) by mouth daily. 90 tablet 3  . zinc oxide (BALMEX) 11.3 % CREA cream Apply 1 application topically 2 (two) times daily. 56 g 1   No current facility-administered medications on file prior to visit.   Allergies  Allergen Reactions  . Fentanyl Nausea Only and Other (See Comments)    Pt states that she is not allergic    . Naprosyn [Naproxen] Other (See Comments)    Patient felt tongue and face swelling. Went to ED where no visible swelling noted    Current Medications, Allergies, Past Medical History, Past Surgical History, Family History and Social History were reviewed in [employer] record.   Review of Systems:   Constitutional: Negative for fever, sweats, chills or weight loss.  Respiratory: Negative for shortness of breath.   Cardiovascular: Negative for chest pain, palpitations and leg swelling.  Gastrointestinal: See HPI.  Musculoskeletal: Negative for back pain or muscle aches.  Neurological: Negative for dizziness, headaches or paresthesias.    Physical Exam: BP 110/60   Pulse 81   Ht 4\' 10"  (1.473 m)   Wt 117 lb 9.6 oz (53.3 kg)   SpO2 99%   BMI 24.58 kg/m   Wt Readings from Last 3 Encounters:  12/07/20 117 lb 9.6 oz (53.3 kg)  10/30/20 107 lb 6.4 oz (48.7 kg)  10/09/20 111 lb 6.4 oz (50.5 kg)   General: 87 year old female in no acute distress.HOH. Ambulates with a walker.  Head: Normocephalic and atraumatic. Eyes: No scleral icterus. Conjunctiva pink . Ears: Normal auditory acuity. Mouth: Absent dentition. No ulcers or lesions.  Lungs: Clear throughout to auscultation. Heart: Regular rate and rhythm, no murmur. Abdomen: Soft, nontender and nondistended. No masses or hepatomegaly. Normal bowel sounds x 4 quadrants. No bruit.  Rectal: Deferred.  Musculoskeletal: Symmetrical with no gross deformities. Extremities: No edema. Neurological: Alert oriented x 4. No focal  deficits.  Psychological: Alert and cooperative. Normal mood and affect  Assessment and Recommendations:  29. 87 year old female with ascending and sigmoid diverticulosis and chronic LLQ abdominal pain. Multiple negative CTs. Most recent CTAP 07/2020 showed stool burden throughout the colon, no mass, diverticulitis or bowel obstruction. Negative abdominal exam today.  -Continue Miralax bid and Senokot 1 to 2 tabs Q HS PRN -Try IBgard 1 po bid, samples provided  -CBC to assess for anemia and infectious process  -Patient to call office if symptoms worsen -Follow up with PCP for chronic pan management, lidocaine patch -Follow up PRN  2. GERD, stable on Pepcid 20mg  QD

## 2020-12-10 NOTE — Progress Notes (Signed)
Reviewed and agree with management plan.  Carolyna Yerian T. Jearldean Gutt, MD FACG [phone]  

## 2020-12-13 ENCOUNTER — Other Ambulatory Visit: Payer: Self-pay | Admitting: Family Medicine

## 2020-12-13 DIAGNOSIS — F411 Generalized anxiety disorder: Secondary | ICD-10-CM

## 2020-12-13 DIAGNOSIS — I5033 Acute on chronic diastolic (congestive) heart failure: Secondary | ICD-10-CM

## 2020-12-13 DIAGNOSIS — I872 Venous insufficiency (chronic) (peripheral): Secondary | ICD-10-CM

## 2021-02-15 DIAGNOSIS — N3281 Overactive bladder: Secondary | ICD-10-CM | POA: Diagnosis not present

## 2021-03-26 ENCOUNTER — Ambulatory Visit: Payer: Medicare Other | Admitting: Orthopedic Surgery

## 2021-04-02 ENCOUNTER — Other Ambulatory Visit: Payer: Self-pay | Admitting: Family Medicine

## 2021-04-02 ENCOUNTER — Encounter: Payer: Self-pay | Admitting: Family Medicine

## 2021-04-02 ENCOUNTER — Other Ambulatory Visit: Payer: Self-pay

## 2021-04-02 ENCOUNTER — Ambulatory Visit (INDEPENDENT_AMBULATORY_CARE_PROVIDER_SITE_OTHER): Payer: Medicare Other | Admitting: Family Medicine

## 2021-04-02 VITALS — BP 116/52 | HR 88 | Temp 97.7°F | Ht <= 58 in | Wt 120.6 lb

## 2021-04-02 DIAGNOSIS — I10 Essential (primary) hypertension: Secondary | ICD-10-CM | POA: Diagnosis not present

## 2021-04-02 DIAGNOSIS — F0281 Dementia in other diseases classified elsewhere with behavioral disturbance: Secondary | ICD-10-CM

## 2021-04-02 DIAGNOSIS — G8929 Other chronic pain: Secondary | ICD-10-CM

## 2021-04-02 DIAGNOSIS — I110 Hypertensive heart disease with heart failure: Secondary | ICD-10-CM

## 2021-04-02 DIAGNOSIS — M545 Low back pain, unspecified: Secondary | ICD-10-CM | POA: Diagnosis not present

## 2021-04-02 DIAGNOSIS — G301 Alzheimer's disease with late onset: Secondary | ICD-10-CM

## 2021-04-02 DIAGNOSIS — I5032 Chronic diastolic (congestive) heart failure: Secondary | ICD-10-CM

## 2021-04-02 DIAGNOSIS — I872 Venous insufficiency (chronic) (peripheral): Secondary | ICD-10-CM

## 2021-04-02 MED ORDER — LIDOCAINE 5 % EX PTCH: 1.0000 | MEDICATED_PATCH | CUTANEOUS | Status: DC

## 2021-04-02 MED ORDER — LIDOCAINE 5 % EX PTCH: 1.0000 | MEDICATED_PATCH | CUTANEOUS | 0 refills | Status: DC

## 2021-04-02 NOTE — Progress Notes (Signed)
Provider:  Alain Honey, MD  Careteam: Patient Care Team: Ngetich, Nelda Bucks, NP as PCP - General (Family Medicine) Josue Hector, MD as PCP - Cardiology (Cardiology) Peggye Form, MD as Referring Physician (Internal Medicine) Garald Balding, MD as Attending Physician (Orthopedic Surgery) Debbra Riding, MD as Consulting Physician (Ophthalmology) Noralee Space, MD as Consulting Physician (Pulmonary Disease)  PLACE OF SERVICE:  [facility]  Advanced Directive information    Allergies  Allergen Reactions  . Fentanyl Nausea Only and Other (See Comments)    Pt states that she is not allergic    . Naprosyn [Naproxen] Other (See Comments)    Patient felt tongue and face swelling. Went to ED where no visible swelling noted    No chief complaint on file.    HPI: Patient is a 87 y.o. female   Review of Systems:  ROS  Past Medical History:  Diagnosis Date  . Abdominal pain, left lower quadrant 06/22/2013  . Abnormal CT scan of lung 07/16/2015  . Allergic rhinitis, cause unspecified 02/21/2014  . Anxiety state, unspecified   . Breast cancer ([place])    NO STICK OR BLOOD PRESSURE CHECKS IN LEFT ARM  . Carpal tunnel syndrome   . Carpal tunnel syndrome on both sides 10/03/2008   Qualifier: Diagnosis of  By: Lenna Gilford MD, Deborra Medina   . Cervical spondylosis   . Chronic pain syndrome 03/03/2019  . Colon polyp 12/19/2009   Transverse-polypoid colorectal mucosa  . Constipation   . Cough 10/21/2014  . Degenerative joint disease   . Diastolic dysfunction   . Diverticula of colon   . Diverticulosis of colon 05/24/2002   Qualifier: Diagnosis of  By: Jerral Ralph    . Gastritis, chronic   . Hiatal hernia   . Hypertension   . Low back pain   . LPRD (laryngopharyngeal reflux disease) 04/04/2014  . Osteoporosis 05/22/2020  . RBBB (right bundle branch block with left anterior fascicular block)   . Renal cyst   . Venous insufficiency    Past Surgical History:  Procedure  Laterality Date  . CARPAL TUNNEL RELEASE    . Left mastectomy     Social History:   reports that she has never smoked. She has never used smokeless tobacco. She reports that she does not drink alcohol and does not use drugs.  Family History  Problem Relation Age of Onset  . Colon cancer Neg Hx   . Pancreatic cancer Neg Hx   . Esophageal cancer Neg Hx   . Stomach cancer Neg Hx     Medications: Patient's Medications  New Prescriptions   LIDOCAINE (LIDODERM) 5 %    Place 1 patch onto the skin daily. Remove & Discard patch within 12 hours or as directed by MD  Previous Medications   ACETAMINOPHEN (TYLENOL) 500 MG TABLET    Take 500 mg by mouth every 6 (six) hours as needed for moderate pain or headache.   ALPRAZOLAM (XANAX) 0.25 MG TABLET    Take 1 tablet by mouth twice daily as needed for anxiety   CARVEDILOL (COREG) 3.125 MG TABLET    Take 1 tablet (3.125 mg total) by mouth 2 (two) times daily.   CHOLECALCIFEROL (VITAMIN D) 1000 UNITS TABLET    Take 1,000 Units by mouth daily.   FAMOTIDINE (PEPCID) 20 MG TABLET    Take 1 tablet by mouth once daily   FEEDING SUPPLEMENT, ENSURE ENLIVE, (ENSURE ENLIVE) LIQD    Take 237 mLs by  mouth 2 (two) times daily between meals.   FUROSEMIDE (LASIX) 20 MG TABLET    TAKE 1/2 (ONE-HALF) TABLET BY MOUTH EVERY OTHER DAY   LEVOCETIRIZINE (XYZAL) 5 MG TABLET    Take 5 mg by mouth daily.    MULTIPLE VITAMINS-MINERALS (MULTIVITAMINS THER. W/MINERALS) TABS    Take 1 tablet by mouth daily.   POLYETHYLENE GLYCOL (MIRALAX / GLYCOLAX) 17 G PACKET    Take 17 g by mouth 2 (two) times daily.   PROPYLENE GLYCOL (SYSTANE COMPLETE OP)    Apply 1 drop to eye daily as needed (dry eyes).   SENNOSIDES-DOCUSATE SODIUM (SENOKOT-S) 8.6-50 MG TABLET    Take 1-2 tablets by mouth daily.   SPIRONOLACTONE (ALDACTONE) 25 MG TABLET    Take 1 tablet (25 mg total) by mouth daily.   ZINC OXIDE (BALMEX) 11.3 % CREA CREAM    Apply 1 application topically 2 (two) times daily.  Modified  Medications   No medications on file  Discontinued Medications   No medications on file    Physical Exam:  There were no vitals filed for this visit. There is no height or weight on file to calculate BMI. Wt Readings from Last 3 Encounters:  04/02/21 120 lb 9.6 oz (54.7 kg)  12/07/20 117 lb 9.6 oz (53.3 kg)  10/30/20 107 lb 6.4 oz (48.7 kg)    Physical Exam  Labs reviewed: Basic Metabolic Panel: Recent Labs    08/14/20 1701 10/09/20 1339 12/07/20 1526  NA 141 141 140  K 3.9 4.2 4.1  CL 105 101 103  CO2 28 33* 33*  GLUCOSE 90 80 95  BUN 21 24 21   CREATININE 1.00 1.05* 1.06  CALCIUM 8.8* 9.7 9.7  TSH  --  3.14  --    Liver Function Tests: Recent Labs    07/31/20 1207 08/14/20 1701 10/09/20 1339 12/07/20 1526  AST 33 22 25 27   ALT 16 15 14 18   ALKPHOS 34* 29*  --  33*  BILITOT 1.0 0.6 0.8 0.8  PROT 6.8 6.3* 6.7 7.0  ALBUMIN 3.8 3.5  --  4.3   Recent Labs    07/31/20 1207 08/14/20 1701  LIPASE 41 32   No results for input(s): AMMONIA in the last 8760 hours. CBC: Recent Labs    08/14/20 1701 10/09/20 1339 12/07/20 1526  WBC 5.1 5.0 5.3  NEUTROABS 2.7 2,670 2.9  HGB 12.8 13.0 13.7  HCT 39.5 39.6 42.1  MCV 96.3 93.4 94.0  PLT 184 221 168.0   Lipid Panel: No results for input(s): CHOL, HDL, LDLCALC, TRIG, CHOLHDL, LDLDIRECT in the last 8760 hours. TSH: Recent Labs    10/09/20 1339  TSH 3.14   A1C: No results found for: HGBA1C   Assessment/Plan    Alain Honey, MD [place] [phone]

## 2021-04-02 NOTE — Progress Notes (Signed)
Provider:  Alain Honey, MD  Careteam: Patient Care Team: Ngetich, Nelda Bucks, NP as PCP - General (Family Medicine) Josue Hector, MD as PCP - Cardiology (Cardiology) Peggye Form, MD as Referring Physician (Internal Medicine) Garald Balding, MD as Attending Physician (Orthopedic Surgery) Debbra Riding, MD as Consulting Physician (Ophthalmology) Noralee Space, MD as Consulting Physician (Pulmonary Disease)  PLACE OF SERVICE:  [facility]  Advanced Directive information    Allergies  Allergen Reactions  . Fentanyl Nausea Only and Other (See Comments)    Pt states that she is not allergic    . Naprosyn [Naproxen] Other (See Comments)    Patient felt tongue and face swelling. Went to ED where no visible swelling noted    Chief Complaint  Patient presents with  . Follow-up    Patient is follow-up for abdominal pain.     HPI: Patient is a 87 y.o. female left lower quadrant pain continues probably related to diverticular disease per previous testing. She also describes some sore on her gluteal cleft.  She was given some zinc oxide.  And she would like to have that examined today. She tends to ramble on and on but I think basically things are unchanged.  She lives alone. She complains about having to take Lasix but knows it is necessary because of her edema. She denies any new symptoms today.  Review of Systems:  Review of Systems  Cardiovascular: Positive for leg swelling.  Gastrointestinal: Positive for abdominal pain.  All other systems reviewed and are negative.   Past Medical History:  Diagnosis Date  . Abdominal pain, left lower quadrant 06/22/2013  . Abnormal CT scan of lung 07/16/2015  . Allergic rhinitis, cause unspecified 02/21/2014  . Anxiety state, unspecified   . Breast cancer ([place])    NO STICK OR BLOOD PRESSURE CHECKS IN LEFT ARM  . Carpal tunnel syndrome   . Carpal tunnel syndrome on both sides 10/03/2008   Qualifier: Diagnosis of  By:  Lenna Gilford MD, Deborra Medina   . Cervical spondylosis   . Chronic pain syndrome 03/03/2019  . Colon polyp 12/19/2009   Transverse-polypoid colorectal mucosa  . Constipation   . Cough 10/21/2014  . Degenerative joint disease   . Diastolic dysfunction   . Diverticula of colon   . Diverticulosis of colon 05/24/2002   Qualifier: Diagnosis of  By: Jerral Ralph    . Gastritis, chronic   . Hiatal hernia   . Hypertension   . Low back pain   . LPRD (laryngopharyngeal reflux disease) 04/04/2014  . Osteoporosis 05/22/2020  . RBBB (right bundle branch block with left anterior fascicular block)   . Renal cyst   . Venous insufficiency    Past Surgical History:  Procedure Laterality Date  . CARPAL TUNNEL RELEASE    . Left mastectomy     Social History:   reports that she has never smoked. She has never used smokeless tobacco. She reports that she does not drink alcohol and does not use drugs.  Family History  Problem Relation Age of Onset  . Colon cancer Neg Hx   . Pancreatic cancer Neg Hx   . Esophageal cancer Neg Hx   . Stomach cancer Neg Hx     Medications: Patient's Medications  New Prescriptions   LIDOCAINE (LIDODERM) 5 %    Place 1 patch onto the skin daily. Remove & Discard patch within 12 hours or as directed by MD  Previous Medications   ACETAMINOPHEN (TYLENOL)  500 MG TABLET    Take 500 mg by mouth every 6 (six) hours as needed for moderate pain or headache.   ALPRAZOLAM (XANAX) 0.25 MG TABLET    Take 1 tablet by mouth twice daily as needed for anxiety   CARVEDILOL (COREG) 3.125 MG TABLET    Take 1 tablet (3.125 mg total) by mouth 2 (two) times daily.   CHOLECALCIFEROL (VITAMIN D) 1000 UNITS TABLET    Take 1,000 Units by mouth daily.   FAMOTIDINE (PEPCID) 20 MG TABLET    Take 1 tablet by mouth once daily   FEEDING SUPPLEMENT, ENSURE ENLIVE, (ENSURE ENLIVE) LIQD    Take 237 mLs by mouth 2 (two) times daily between meals.   FUROSEMIDE (LASIX) 20 MG TABLET    TAKE 1/2 (ONE-HALF) TABLET  BY MOUTH EVERY OTHER DAY   LEVOCETIRIZINE (XYZAL) 5 MG TABLET    Take 5 mg by mouth daily.    MULTIPLE VITAMINS-MINERALS (MULTIVITAMINS THER. W/MINERALS) TABS    Take 1 tablet by mouth daily.   POLYETHYLENE GLYCOL (MIRALAX / GLYCOLAX) 17 G PACKET    Take 17 g by mouth 2 (two) times daily.   PROPYLENE GLYCOL (SYSTANE COMPLETE OP)    Apply 1 drop to eye daily as needed (dry eyes).   SENNOSIDES-DOCUSATE SODIUM (SENOKOT-S) 8.6-50 MG TABLET    Take 1-2 tablets by mouth daily.   SPIRONOLACTONE (ALDACTONE) 25 MG TABLET    Take 1 tablet (25 mg total) by mouth daily.   ZINC OXIDE (BALMEX) 11.3 % CREA CREAM    Apply 1 application topically 2 (two) times daily.  Modified Medications   No medications on file  Discontinued Medications   No medications on file    Physical Exam:  Vitals:   04/02/21 1343  BP: (!) 116/52  Pulse: 88  Temp: 97.7 F (36.5 C)  TempSrc: Temporal  SpO2: 97%  Weight: 120 lb 9.6 oz (54.7 kg)  Height: 4\' 10"  (1.473 m)   Body mass index is 25.21 kg/m. Wt Readings from Last 3 Encounters:  04/02/21 120 lb 9.6 oz (54.7 kg)  12/07/20 117 lb 9.6 oz (53.3 kg)  10/30/20 107 lb 6.4 oz (48.7 kg)    Physical Exam Vitals and nursing note reviewed.  Cardiovascular:     Rate and Rhythm: Normal rate and regular rhythm.  Pulmonary:     Effort: Pulmonary effort is normal.  Abdominal:     General: Abdomen is flat. Bowel sounds are normal.     Palpations: Abdomen is soft.  Skin:    Comments: Area in question on the left side of upper gluteal cleft appears to be some scar tissue.  There is no erythema or drainage and is not particularly tender to palpation  Neurological:     Mental Status: She is alert.     Labs reviewed: Basic Metabolic Panel: Recent Labs    08/14/20 1701 10/09/20 1339 12/07/20 1526  NA 141 141 140  K 3.9 4.2 4.1  CL 105 101 103  CO2 28 33* 33*  GLUCOSE 90 80 95  BUN 21 24 21   CREATININE 1.00 1.05* 1.06  CALCIUM 8.8* 9.7 9.7  TSH  --  3.14  --     Liver Function Tests: Recent Labs    07/31/20 1207 08/14/20 1701 10/09/20 1339 12/07/20 1526  AST 33 22 25 27   ALT 16 15 14 18   ALKPHOS 34* 29*  --  33*  BILITOT 1.0 0.6 0.8 0.8  PROT 6.8 6.3* 6.7 7.0  ALBUMIN 3.8 3.5  --  4.3   Recent Labs    07/31/20 1207 08/14/20 1701  LIPASE 41 32   No results for input(s): AMMONIA in the last 8760 hours. CBC: Recent Labs    08/14/20 1701 10/09/20 1339 12/07/20 1526  WBC 5.1 5.0 5.3  NEUTROABS 2.7 2,670 2.9  HGB 12.8 13.0 13.7  HCT 39.5 39.6 42.1  MCV 96.3 93.4 94.0  PLT 184 221 168.0   Lipid Panel: No results for input(s): CHOL, HDL, LDLCALC, TRIG, CHOLHDL, LDLDIRECT in the last 8760 hours. TSH: Recent Labs    10/09/20 1339  TSH 3.14   A1C: No results found for: HGBA1C   Assessment/Plan  1. Hypertensive heart disease with chronic diastolic congestive heart failure (HCC) No shortness of breath.  Continues with dependent edema and takes Lasix and Aldactone  2. Chronic bilateral low back pain without sciatica Back pain is unchanged will refill Lidoderm - lidocaine (LIDODERM) 5 %; Place 1 patch onto the skin daily. Remove & Discard patch within 12 hours or as directed by MD  Dispense: 30 patch; Refill: 0  3. Primary hypertension Blood pressure good at 116/52  4. Chronic diastolic congestive heart failure ([place]) See above.  Condition stable.  Since she is taking diuretics we will check electrolytes today specifically looking at potassium.  5. Venous insufficiency of both lower extremities 3+ edema.  Stressed importance of elevation of legs when possible  6. Late onset Alzheimer's dementia with behavioral disturbance ([place]) Fairly functional since she lives by herself.  Son does check on her frequently   Alain Honey, MD [place] [phone]

## 2021-04-03 LAB — BASIC METABOLIC PANEL
BUN/Creatinine Ratio: 23 (calc) — ABNORMAL HIGH (ref 6–22)
BUN: 25 mg/dL (ref 7–25)
CO2: 29 mmol/L (ref 20–32)
Calcium: 9.2 mg/dL (ref 8.6–10.4)
Chloride: 104 mmol/L (ref 98–110)
Creat: 1.1 mg/dL — ABNORMAL HIGH (ref 0.60–0.88)
Glucose, Bld: 104 mg/dL (ref 65–139)
Potassium: 4 mmol/L (ref 3.5–5.3)
Sodium: 141 mmol/L (ref 135–146)

## 2021-04-10 ENCOUNTER — Ambulatory Visit: Payer: Medicare Other | Admitting: Family

## 2021-05-10 ENCOUNTER — Other Ambulatory Visit: Payer: Self-pay | Admitting: Family Medicine

## 2021-05-10 DIAGNOSIS — I10 Essential (primary) hypertension: Secondary | ICD-10-CM

## 2021-05-24 DIAGNOSIS — N3281 Overactive bladder: Secondary | ICD-10-CM | POA: Diagnosis not present

## 2021-05-25 ENCOUNTER — Other Ambulatory Visit: Payer: Self-pay | Admitting: Family

## 2021-05-27 ENCOUNTER — Other Ambulatory Visit: Payer: Self-pay

## 2021-05-27 ENCOUNTER — Encounter: Payer: Self-pay | Admitting: Family

## 2021-05-27 ENCOUNTER — Ambulatory Visit (INDEPENDENT_AMBULATORY_CARE_PROVIDER_SITE_OTHER): Payer: Medicare Other | Admitting: Family

## 2021-05-27 VITALS — BP 110/60 | HR 80 | Temp 97.3°F | Resp 16 | Ht <= 58 in | Wt 114.8 lb

## 2021-05-27 DIAGNOSIS — L89150 Pressure ulcer of sacral region, unstageable: Secondary | ICD-10-CM

## 2021-05-27 DIAGNOSIS — K449 Diaphragmatic hernia without obstruction or gangrene: Secondary | ICD-10-CM | POA: Diagnosis not present

## 2021-05-27 DIAGNOSIS — K219 Gastro-esophageal reflux disease without esophagitis: Secondary | ICD-10-CM

## 2021-05-27 MED ORDER — FAMOTIDINE 20 MG PO TABS: 20.0000 mg | ORAL_TABLET | Freq: Every day | ORAL | 5 refills | Status: DC

## 2021-05-27 NOTE — Patient Instructions (Signed)
-   Notify provider if pressure ulcer opens up or any drainage. - use gel cushion daily on your recliner

## 2021-05-27 NOTE — Progress Notes (Signed)
Provider: Marlowe Sax FNP-C  Kerith Sherley, Nelda Bucks, NP  Patient Care Team: Johnthomas Lader, Nelda Bucks, NP as PCP - General (Family Medicine) Josue Hector, MD as PCP - Cardiology (Cardiology) Peggye Form, MD as Referring Physician (Internal Medicine) Garald Balding, MD as Attending Physician (Orthopedic Surgery) Debbra Riding, MD as Consulting Physician (Ophthalmology) Noralee Space, MD as Consulting Physician (Pulmonary Disease)  Extended Emergency Contact Information Primary Emergency Contact: Rickenbach,Michael Address: [place], [place] [address] [place] Phone: [phone] Relation: Son Secondary Emergency Contact: Chrissie Noa Mobile Phone: [phone] Relation: Other Preferred language: Cle[place]us Molt Interpreter needed? No  Code Status: Full Code  Goals of care: Advanced Directive information Advanced Directives 05/27/2021  Does Patient Have a Medical Advance Directive? No  Type of Advance Directive -  Does patient want to make changes to medical advance directive? -  Copy of [place] in Chart? -  Would patient like information on creating a medical advance directive? No - Patient declined     Chief Complaint  Patient presents with   Acute Visit    Complains of something on backside and stomach.    HPI:  Pt is a 87 y.o. female seen today for an acute visit for evaluation of sacral area.She is here with her son.She states previous ulcer site still bothers her.Has not opened up but painful.Has been applying Zinc oxide.Per son she tends to wipe area hard instead of patting. She is incontinent and wear a pull up and applies another pad. She sits and sleeps on her recliner since it's closer to the bathroom.will sleep in her bed every once in a while. Drinks Ensure protein supplement twice daily.  Has had no fever or chills  Request refill for Famotidine. Symptoms stable.    Past Medical History:   Diagnosis Date   Abdominal pain, left lower quadrant 06/22/2013   Abnormal CT scan of lung 07/16/2015   Allergic rhinitis, cause unspecified 02/21/2014   Anxiety state, unspecified    Breast cancer ([place])    NO STICK OR BLOOD PRESSURE CHECKS IN LEFT ARM   Carpal tunnel syndrome    Carpal tunnel syndrome on both sides 10/03/2008   Qualifier: Diagnosis of  By: Lenna Gilford MD, Deborra Medina    Cervical spondylosis    Chronic pain syndrome 03/03/2019   Colon polyp 12/19/2009   Transverse-polypoid colorectal mucosa   Constipation    Cough 10/21/2014   Degenerative joint disease    Diastolic dysfunction    Diverticula of colon    Diverticulosis of colon 05/24/2002   Qualifier: Diagnosis of  By: Jerral Ralph     Gastritis, chronic    Hiatal hernia    Hypertension    Low back pain    LPRD (laryngopharyngeal reflux disease) 04/04/2014   Osteoporosis 05/22/2020   RBBB (right bundle branch block with left anterior fascicular block)    Renal cyst    Venous insufficiency    Past Surgical History:  Procedure Laterality Date   CARPAL TUNNEL RELEASE     Left mastectomy      Allergies  Allergen Reactions   Fentanyl Nausea Only and Other (See Comments)    Pt states that she is not allergic     Naprosyn [Naproxen] Other (See Comments)    Patient felt tongue and face swelling. Went to ED where no visible swelling noted    Outpatient Encounter Medications as of 05/27/2021  Medication  Sig   acetaminophen (TYLENOL) 500 MG tablet Take 500 mg by mouth every 6 (six) hours as needed for moderate pain or headache.   ALPRAZolam (XANAX) 0.25 MG tablet Take 1 tablet by mouth twice daily as needed for anxiety   carvedilol (COREG) 3.125 MG tablet Take 1 tablet by mouth twice daily   cholecalciferol (VITAMIN D) 1000 units tablet Take 1,000 Units by mouth daily.   famotidine (PEPCID) 20 MG tablet Take 1 tablet by mouth once daily   feeding supplement, ENSURE ENLIVE, (ENSURE ENLIVE) LIQD Take 237 mLs by mouth 2  (two) times daily between meals.   furosemide (LASIX) 20 MG tablet TAKE 1/2 (ONE-HALF) TABLET BY MOUTH EVERY OTHER DAY   levocetirizine (XYZAL) 5 MG tablet Take 5 mg by mouth daily.    lidocaine (LIDODERM) 5 % Place 1 patch onto the skin daily. Remove & Discard patch within 12 hours or as directed by MD   Multiple Vitamins-Minerals (MULTIVITAMINS THER. W/MINERALS) TABS Take 1 tablet by mouth daily.   polyethylene glycol (MIRALAX / GLYCOLAX) 17 g packet Take 17 g by mouth 2 (two) times daily.   Propylene Glycol (SYSTANE COMPLETE OP) Apply 1 drop to eye daily as needed (dry eyes).   sennosides-docusate sodium (SENOKOT-S) 8.6-50 MG tablet Take 1-2 tablets by mouth daily.   spironolactone (ALDACTONE) 25 MG tablet Take 1 tablet (25 mg total) by mouth daily.   zinc oxide (BALMEX) 11.3 % CREA cream Apply 1 application topically 2 (two) times daily.   No facility-administered encounter medications on file as of 05/27/2021.    Review of Systems  Constitutional:  Negative for appetite change, chills, fatigue, fever and unexpected weight change.  HENT:  Negative for trouble swallowing.   Respiratory:  Negative for cough, chest tightness, shortness of breath and wheezing.   Cardiovascular:  Positive for leg swelling. Negative for chest pain and palpitations.  Gastrointestinal:  Positive for constipation. Negative for abdominal distention, abdominal pain, blood in stool, diarrhea, nausea and vomiting.  Genitourinary:  Negative for difficulty urinating, dysuria, flank pain and frequency.       Incontinent   Musculoskeletal:  Positive for arthralgias, back pain and gait problem. Negative for joint swelling, myalgias, neck pain and neck stiffness.  Skin:  Negative for color change, pallor, rash and wound.       Sacral previous ulcer site pain   Neurological:  Negative for dizziness, syncope, speech difficulty, weakness, light-headedness, numbness and headaches.  Psychiatric/Behavioral:  Negative for  agitation, behavioral problems, confusion and sleep disturbance. The patient is not nervous/anxious.    Immunization History  Administered Date(s) Administered   Influenza, High Dose Seasonal PF 08/21/2015   Influenza,inj,Quad PF,6+ Mos 09/18/2015   PFIZER(Purple Top)SARS-COV-2 Vaccination 04/18/2020, 05/10/2020   Pneumococcal Polysaccharide-23 02/07/2015   Pertinent  Health Maintenance Due  Topic Date Due   PNA vac Low Risk Adult (2 of 2 - PCV13) 10/09/2021 (Originally 02/07/2016)   INFLUENZA VACCINE  06/17/2021   DEXA SCAN  Completed   Fall Risk  05/27/2021 10/30/2020 10/09/2020 05/17/2020 04/30/2020  Falls in the past year? 0 0 0 1 0  Number falls in past yr: - 0 0 0 -  Injury with Fall? 0 0 0 0 -  Risk for fall due to : No Fall Risks - - - -  Follow up Falls evaluation completed - - - -   Functional Status Survey:    Vitals:   05/27/21 1430  BP: 110/60  Pulse: 80  Resp: 16  Temp: (!)  97.3 F (36.3 C)  SpO2: 96%  Weight: 114 lb 12.8 oz (52.1 kg)  Height: 4\' 10"  (1.473 m)   Body mass index is 23.99 kg/m. Physical Exam Vitals reviewed.  Constitutional:      General: She is not in acute distress.    Appearance: Normal appearance. She is normal weight. She is not ill-appearing or diaphoretic.  HENT:     Head: Normocephalic.  Eyes:     General: No scleral icterus.       Right eye: No discharge.        Left eye: No discharge.     Extraocular Movements: Extraocular movements intact.     Conjunctiva/sclera: Conjunctivae normal.     Pupils: Pupils are equal, round, and reactive to light.  Cardiovascular:     Rate and Rhythm: Normal rate and regular rhythm.     Pulses: Normal pulses.     Heart sounds: Murmur heard.    No friction rub. No gallop.  Pulmonary:     Effort: Pulmonary effort is normal. No respiratory distress.     Breath sounds: Normal breath sounds. No wheezing, rhonchi or rales.  Chest:     Chest wall: No tenderness.  Abdominal:     General: Bowel  sounds are normal. There is no distension.     Palpations: Abdomen is soft. There is no mass.     Tenderness: There is no abdominal tenderness. There is no right CVA tenderness, left CVA tenderness, guarding or rebound.  Musculoskeletal:        General: No swelling or tenderness.     Comments: Unsteady gait walks with Rolator  Bilateral lower extremities chronic edema   Skin:    General: Skin is warm and dry.     Coloration: Skin is not pale.     Findings: No bruising, erythema, lesion or rash.     Comments: Sacral area pale pink in color without any open area or drainage.surrounding skin without any signs of infection   Neurological:     Mental Status: She is alert and oriented to person, place, and time.     Motor: No weakness.     Gait: Gait abnormal.  Psychiatric:        Mood and Affect: Mood normal.        Speech: Speech normal.        Behavior: Behavior normal.        Thought Content: Thought content normal.        Judgment: Judgment normal.    Labs reviewed: Recent Labs    10/09/20 1339 12/07/20 1526 04/02/21 1433  NA 141 140 141  K 4.2 4.1 4.0  CL 101 103 104  CO2 33* 33* 29  GLUCOSE 80 95 104  BUN 24 21 25   CREATININE 1.05* 1.06 1.10*  CALCIUM 9.7 9.7 9.2   Recent Labs    07/31/20 1207 08/14/20 1701 10/09/20 1339 12/07/20 1526  AST 33 22 25 27   ALT 16 15 14 18   ALKPHOS 34* 29*  --  33*  BILITOT 1.0 0.6 0.8 0.8  PROT 6.8 6.3* 6.7 7.0  ALBUMIN 3.8 3.5  --  4.3   Recent Labs    08/14/20 1701 10/09/20 1339 12/07/20 1526  WBC 5.1 5.0 5.3  NEUTROABS 2.7 2,670 2.9  HGB 12.8 13.0 13.7  HCT 39.5 39.6 42.1  MCV 96.3 93.4 94.0  PLT 184 221 168.0   Lab Results  Component Value Date   TSH 3.14 10/09/2020   No  results found for: HGBA1C Lab Results  Component Value Date   CHOL 199 02/02/2014   HDL 98.70 02/02/2014   LDLCALC 96 02/02/2014   TRIG 20.0 02/02/2014   CHOLHDL 2 02/02/2014    Significant Diagnostic Results in last 30 days:  No results  found.  Assessment/Plan 1. Pressure injury of sacral region, unstageable ([place]) Sacral area pale pink in color without any open area or drainage.surrounding skin without any signs of infection  - advised to use gel cushion script written today and given to son to take to medical supply store.  - continue with zinc oxide daily as needed to protect skin  - avoids wiping sacral area too hard. - advised to wear pull ups alone instead of adding extra pad which could be contributing to skin breakdown.  - For home use only DME Other see comment  2. Hiatal hernia with GERD without esophagitis Symptoms controlled on Famotidine.  - famotidine (PEPCID) 20 MG tablet; Take 1 tablet (20 mg total) by mouth daily.  Dispense: 30 tablet; Refill: 5    Family/ staff Communication: Reviewed plan of care with patient and son verbalized understanding.   Labs/tests ordered: None   Next Appointment: Has appointment 10/03/2021   Sandrea Hughs, NP

## 2021-06-07 ENCOUNTER — Telehealth: Payer: Self-pay | Admitting: Family

## 2021-06-07 NOTE — Telephone Encounter (Signed)
Please schedule a face to face appoint to evaluate for Home health as requested.

## 2021-06-07 NOTE — Telephone Encounter (Signed)
-----   Message from Dellia Nims sent at 06/07/2021  9:03 AM EDT ----- Regarding: Request for Referral to [facility] Good morning. Hale Bogus is the RN Case Manager over at [facility] and they are partnered with [employer]. She would like someone to give her a call back in regards to the above patient to see if she could get a referral for [facility]. Her contact number is [phone].  Thank you   Mordecai Maes

## 2021-06-10 ENCOUNTER — Other Ambulatory Visit: Payer: Self-pay

## 2021-06-10 ENCOUNTER — Emergency Department (HOSPITAL_COMMUNITY)
Admission: EM | Admit: 2021-06-10 | Discharge: 2021-06-10 | Disposition: A | Discharge status: Home / Self Care | Payer: Medicare Other | Attending: Emergency Medicine | Admitting: Emergency Medicine

## 2021-06-10 ENCOUNTER — Emergency Department (HOSPITAL_COMMUNITY): Payer: Medicare Other

## 2021-06-10 ENCOUNTER — Encounter (HOSPITAL_COMMUNITY): Payer: Self-pay

## 2021-06-10 DIAGNOSIS — Z79899 Other long term (current) drug therapy: Secondary | ICD-10-CM | POA: Insufficient documentation

## 2021-06-10 DIAGNOSIS — N281 Cyst of kidney, acquired: Secondary | ICD-10-CM | POA: Diagnosis not present

## 2021-06-10 DIAGNOSIS — R350 Frequency of micturition: Secondary | ICD-10-CM | POA: Diagnosis not present

## 2021-06-10 DIAGNOSIS — I110 Hypertensive heart disease with heart failure: Secondary | ICD-10-CM | POA: Insufficient documentation

## 2021-06-10 DIAGNOSIS — I5030 Unspecified diastolic (congestive) heart failure: Secondary | ICD-10-CM | POA: Diagnosis not present

## 2021-06-10 DIAGNOSIS — M160 Bilateral primary osteoarthritis of hip: Secondary | ICD-10-CM | POA: Diagnosis not present

## 2021-06-10 DIAGNOSIS — I959 Hypotension, unspecified: Secondary | ICD-10-CM | POA: Diagnosis not present

## 2021-06-10 DIAGNOSIS — R1084 Generalized abdominal pain: Secondary | ICD-10-CM | POA: Diagnosis not present

## 2021-06-10 DIAGNOSIS — R1032 Left lower quadrant pain: Secondary | ICD-10-CM | POA: Diagnosis not present

## 2021-06-10 DIAGNOSIS — N3289 Other specified disorders of bladder: Secondary | ICD-10-CM | POA: Diagnosis not present

## 2021-06-10 LAB — CBC WITH DIFFERENTIAL/PLATELET
Abs Immature Granulocytes: 0.01 10*3/uL (ref 0.00–0.07)
Basophils Absolute: 0 10*3/uL (ref 0.0–0.1)
Basophils Relative: 0 %
Eosinophils Absolute: 0 10*3/uL (ref 0.0–0.5)
Eosinophils Relative: 1 %
HCT: 37.7 % (ref 36.0–46.0)
Hemoglobin: 11.9 g/dL — ABNORMAL LOW (ref 12.0–15.0)
Immature Granulocytes: 0 %
Lymphocytes Relative: 27 %
Lymphs Abs: 1.2 10*3/uL (ref 0.7–4.0)
MCH: 31.2 pg (ref 26.0–34.0)
MCHC: 31.6 g/dL (ref 30.0–36.0)
MCV: 98.7 fL (ref 80.0–100.0)
Monocytes Absolute: 0.6 10*3/uL (ref 0.1–1.0)
Monocytes Relative: 14 %
Neutro Abs: 2.6 10*3/uL (ref 1.7–7.7)
Neutrophils Relative %: 58 %
Platelets: 174 10*3/uL (ref 150–400)
RBC: 3.82 MIL/uL — ABNORMAL LOW (ref 3.87–5.11)
RDW: 13.4 % (ref 11.5–15.5)
WBC: 4.5 10*3/uL (ref 4.0–10.5)
nRBC: 0 % (ref 0.0–0.2)

## 2021-06-10 LAB — URINALYSIS, ROUTINE W REFLEX MICROSCOPIC
Bacteria, UA: NONE SEEN
Bilirubin Urine: NEGATIVE
Glucose, UA: NEGATIVE mg/dL
Ketones, ur: NEGATIVE mg/dL
Leukocytes,Ua: NEGATIVE
Nitrite: NEGATIVE
Protein, ur: NEGATIVE mg/dL
Specific Gravity, Urine: 1.003 — ABNORMAL LOW (ref 1.005–1.030)
pH: 9 — ABNORMAL HIGH (ref 5.0–8.0)

## 2021-06-10 LAB — BASIC METABOLIC PANEL
Anion gap: 5 (ref 5–15)
BUN: 21 mg/dL (ref 8–23)
CO2: 28 mmol/L (ref 22–32)
Calcium: 8.1 mg/dL — ABNORMAL LOW (ref 8.9–10.3)
Chloride: 104 mmol/L (ref 98–111)
Creatinine, Ser: 0.76 mg/dL (ref 0.44–1.00)
GFR, Estimated: >60 mL/min (ref >60)
Glucose, Bld: 86 mg/dL (ref 70–99)
Potassium: 3.6 mmol/L (ref 3.5–5.1)
Sodium: 137 mmol/L (ref 135–145)

## 2021-06-10 MED ORDER — ACETAMINOPHEN 325 MG PO TABS
650.0000 mg | ORAL_TABLET | Freq: Once | ORAL | Status: AC
  Administered 2021-06-10: 650 mg via ORAL
  Filled 2021-06-10: qty 2

## 2021-06-10 NOTE — ED Triage Notes (Signed)
Pt BIB EMS from home. Pt c/o frequent urination and LLQ pain x1 week, worse yesterday. Pt denies chest pain.   20G R FA

## 2021-06-10 NOTE — ED Notes (Signed)
Pt son notified to come pick up patient. Pt son is on the way.

## 2021-06-10 NOTE — ED Provider Notes (Signed)
[facility] DEPT Provider Note   CSN: [id number] Arrival date & time: 06/10/21  1100     History Chief Complaint  Patient presents with   Urinary Frequency    Lacresia LAYLANA KOSTER is a 87 y.o. female.  The history is provided by the patient.  Urinary Frequency This is a new problem. Episode onset: about one week. The problem occurs daily. The problem has not changed since onset.Associated symptoms include abdominal pain (LLQ, mild, and is consistent with past symptoms). Pertinent negatives include no chest pain, no headaches and no shortness of breath. Nothing aggravates the symptoms. Nothing relieves the symptoms. She has tried nothing for the symptoms. The treatment provided no relief.      Past Medical History:  Diagnosis Date   Abdominal pain, left lower quadrant 06/22/2013   Abnormal CT scan of lung 07/16/2015   Allergic rhinitis, cause unspecified 02/21/2014   Anxiety state, unspecified    Breast cancer ([place])    NO STICK OR BLOOD PRESSURE CHECKS IN LEFT ARM   Carpal tunnel syndrome    Carpal tunnel syndrome on both sides 10/03/2008   Qualifier: Diagnosis of  By: Lenna Gilford MD, Deborra Medina    Cervical spondylosis    Chronic pain syndrome 03/03/2019   Colon polyp 12/19/2009   Transverse-polypoid colorectal mucosa   Constipation    Cough 10/21/2014   Degenerative joint disease    Diastolic dysfunction    Diverticula of colon    Diverticulosis of colon 05/24/2002   Qualifier: Diagnosis of  By: Jerral Ralph     Gastritis, chronic    Hiatal hernia    Hypertension    Low back pain    LPRD (laryngopharyngeal reflux disease) 04/04/2014   Osteoporosis 05/22/2020   RBBB (right bundle branch block with left anterior fascicular block)    Renal cyst    Venous insufficiency     Patient Active Problem List   Diagnosis Date Noted   Pre-ulcerative corn or callous 10/04/2020   Osteoporosis 05/22/2020   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 ([place]) 05/17/2020    Bronchiectasis ([place]) 05/17/2020   Kyphosis of thoracic region 05/17/2020   SI (sacroiliac) joint inflammation ([place]) 10/04/2019   DDD (degenerative disc disease), lumbar 10/04/2019   Hearing decreased, bilateral 06/30/2019   Chronic pain syndrome 03/03/2019   Unilateral primary osteoarthritis, right knee 01/13/2019   Medication management 02/28/2018   Urinary incontinence without sensory awareness 12/02/2017   Arthritis of left hip 10/20/2017   Functional abdominal pain syndrome 09/02/2017   Encounter for chronic pain management 07/22/2017   Dementia with behavioral disturbance 06/25/2017   Vitamin D deficiency 05/21/2017   Chronic LLQ pain 11/23/2014   Esophageal dysmotility 10/27/2014   Arthritis of hand, degenerative 04/10/2014   Other and unspecified hyperlipidemia 04/10/2014   Chronic bronchitis ([place]) 05/30/2013   Hx of breast cancer 08/30/2012   Venous insufficiency of both lower extremities 06/12/2011   GERD without esophagitis [id number]   Diastolic CHF ([place]) [id number]   Constipation 01/18/2009   Cervical spondylosis without myelopathy 03/21/2008   Diaphragmatic hernia 12/26/2007   Anxiety state 09/15/2007   Primary hypertension 09/15/2007   Venous (peripheral) insufficiency 09/15/2007    Past Surgical History:  Procedure Laterality Date   CARPAL TUNNEL RELEASE     Left mastectomy       OB History   No obstetric history on file.     Family History  Problem Relation Age of Onset   Colon cancer Neg Hx    Pancreatic  cancer Neg Hx    Esophageal cancer Neg Hx    Stomach cancer Neg Hx     Social History   Tobacco Use   Smoking status: Never   Smokeless tobacco: Never  Vaping Use   Vaping Use: Never used  Substance Use Topics   Alcohol use: No    Alcohol/week: 0.0 standard drinks   Drug use: No    Home Medications Prior to Admission medications   Medication Sig Start Date End Date Taking? Authorizing Provider  acetaminophen (TYLENOL) 500 MG tablet  Take 500 mg by mouth every 6 (six) hours as needed for moderate pain or headache.    [provider]  ALPRAZolam Duanne Moron) 0.25 MG tablet Take 1 tablet by mouth twice daily as needed for anxiety 12/13/20   Medina-Vargas, Monina C, NP  carvedilol (COREG) 3.125 MG tablet Take 1 tablet by mouth twice daily 05/13/21   Ngetich, Dinah C, NP  cholecalciferol (VITAMIN D) 1000 units tablet Take 1,000 Units by mouth daily.    [provider]  famotidine (PEPCID) 20 MG tablet Take 1 tablet (20 mg total) by mouth daily. 05/27/21   Ngetich, Dinah C, NP  feeding supplement, ENSURE ENLIVE, (ENSURE ENLIVE) LIQD Take 237 mLs by mouth 2 (two) times daily between meals. 09/30/16   Nche, Charlene Brooke, NP  furosemide (LASIX) 20 MG tablet TAKE 1/2 (ONE-HALF) TABLET BY MOUTH EVERY OTHER DAY 12/13/20   Medina-Vargas, Monina C, NP  levocetirizine (XYZAL) 5 MG tablet Take 5 mg by mouth daily.  05/18/18   [provider]  lidocaine (LIDODERM) 5 % Place 1 patch onto the skin daily. Remove & Discard patch within 12 hours or as directed by MD 04/02/21   Wardell Honour, MD  Multiple Vitamins-Minerals (MULTIVITAMINS THER. W/MINERALS) TABS Take 1 tablet by mouth daily.    [provider]  polyethylene glycol (MIRALAX / GLYCOLAX) 17 g packet Take 17 g by mouth 2 (two) times daily.    [provider]  Propylene Glycol (SYSTANE COMPLETE OP) Apply 1 drop to eye daily as needed (dry eyes).    [provider]  sennosides-docusate sodium (SENOKOT-S) 8.6-50 MG tablet Take 1-2 tablets by mouth daily.    [provider]  spironolactone (ALDACTONE) 25 MG tablet Take 1 tablet (25 mg total) by mouth daily. 02/03/20   [name], MD  zinc oxide (BALMEX) 11.3 % CREA cream Apply 1 application topically 2 (two) times daily. 10/09/20   Ngetich, Dinah C, NP    Allergies    Fentanyl and Naprosyn [naproxen]  Review of Systems   Review of Systems  Constitutional:  Negative for chills  and fever.  HENT:  Negative for ear pain and sore throat.   Eyes:  Negative for pain and visual disturbance.  Respiratory:  Negative for cough and shortness of breath.   Cardiovascular:  Negative for chest pain and palpitations.  Gastrointestinal:  Positive for abdominal pain (LLQ, mild, and is consistent with past symptoms). Negative for vomiting.  Genitourinary:  Positive for frequency. Negative for dysuria and hematuria.  Musculoskeletal:  Negative for arthralgias and back pain.  Skin:  Negative for color change and rash.  Neurological:  Negative for seizures, syncope and headaches.  All other systems reviewed and are negative.  Physical Exam Updated Vital Signs BP 121/74   Pulse 79   Temp 98.5 F (36.9 C) (Oral)   Resp 18   SpO2 100%   Physical Exam Vitals and nursing note reviewed.  HENT:  Head: Normocephalic and atraumatic.  Eyes:     General: No scleral icterus. Pulmonary:     Effort: Pulmonary effort is normal. No respiratory distress.  Abdominal:     General: There is no distension.     Palpations: Abdomen is soft.     Tenderness: There is no abdominal tenderness. There is no guarding.  Genitourinary:    General: Normal vulva.  Musculoskeletal:        General: No deformity or signs of injury.     Cervical back: Normal range of motion.  Skin:    General: Skin is warm and dry.  Neurological:     General: No focal deficit present.     Mental Status: She is alert and oriented to person, place, and time.  Psychiatric:        Mood and Affect: Mood normal.    ED Results / Procedures / Treatments   Labs (all labs ordered are listed, but only abnormal results are displayed) Labs Reviewed  URINALYSIS, ROUTINE W REFLEX MICROSCOPIC - Abnormal; Notable for the following components:      Result Value   Color, Urine STRAW (*)    Specific Gravity, Urine 1.003 (*)    pH 9.0 (*)    Hgb urine dipstick SMALL (*)    All other components within normal limits  CBC WITH  DIFFERENTIAL/PLATELET - Abnormal; Notable for the following components:   RBC 3.82 (*)    Hemoglobin 11.9 (*)    All other components within normal limits  BASIC METABOLIC PANEL - Abnormal; Notable for the following components:   Calcium 8.1 (*)    All other components within normal limits    EKG None  Radiology CT Renal Stone Study  Result Date: 06/10/2021 CLINICAL DATA:  Frequent urination and left lower quadrant pain for 1 week, symptoms worsened yesterday. EXAM: CT ABDOMEN AND PELVIS WITHOUT CONTRAST TECHNIQUE: Multidetector CT imaging of the abdomen and pelvis was performed following the standard protocol without IV contrast. COMPARISON:  CT abdomen and pelvis 08/14/2020 and 04/27/2018 FINDINGS: Lower chest: Trace bilateral pleural effusions. Heart size is upper normal. No pericardial effusion. There is some dependent atelectasis. Hepatobiliary: No focal liver abnormality is seen. No gallstones, gallbladder wall thickening, or biliary dilatation. Pancreas: Unremarkable. No pancreatic ductal dilatation or surrounding inflammatory changes. Spleen: Normal in size without focal abnormality. Adrenals/Urinary Tract: The adrenal glands appear normal. Small cyst upper pole right kidney is unchanged. Mild fullness of the intrarenal collecting systems and renal pelves is chronic. The urinary bladder is incompletely distended but otherwise unremarkable. No stones are identified. Stomach/Bowel: Stomach is within normal limits. Appendix appears normal. No evidence of bowel wall thickening, distention, or inflammatory changes. Fairly large stool ball in the rectum noted. Vascular/Lymphatic: Aortic atherosclerosis. No enlarged abdominal or pelvic lymph nodes. Reproductive: Uterus and bilateral adnexa are unremarkable. Other: Mild-to-moderate body wall edema is seen. A trace amount of perihepatic ascites is also present. Musculoskeletal: No acute or focal abnormality. Degenerative disease of the visualized spine  and hips noted. IMPRESSION: Body wall edema and a trace amount of perihepatic ascites compatible with volume overload. No finding to explain the patient's urinary tract symptoms. Aortic Atherosclerosis (ICD10-I70.0). Electronically Signed   By: Inge Rise M.D.   On: 06/10/2021 14:55    Procedures Procedures   Medications Ordered in ED Medications - No data to display  ED Course  I have reviewed the triage vital signs and the nursing notes.  Pertinent labs & imaging results that were available  during my care of the patient were reviewed by me and considered in my medical decision making (see chart for details).    MDM Rules/Calculators/A&P                           [name] presents with urinary frequency.  She also endorsed some left lower quadrant pain which had been noted is a chronic issue in prior notes.  She is completely coherent, but history and exam are limited by her tangential storytelling.  She does not answer questions in a direct manner, and she charming the tells [place] about growing up in [place], the length of time she has lived in her home, and other life details.  At one point, she seemed to be upset about her son.  Her son lives across the street from her and cares for her.  Both Barnetta Chapel, RN, and myself were concerned about possible safety for the patient, but after listening to her described issues, this did not seem to be the problem.  ED work-up was largely unrevealing.  She has some evidence of anasarca on his CT scan, but clinically, she is not experiencing any acute issues from this problem.  She does take a diuretic.  She is not on oxygen.  I think she is suitable for outpatient management and can be discharged home. Final Clinical Impression(s) / ED Diagnoses Final diagnoses:  Urinary frequency    Rx / DC Orders ED Discharge Orders     None        Arnaldo Natal, MD 06/10/21 1513

## 2021-06-10 NOTE — ED Notes (Signed)
Pt states son helps take care of her, he lives across the street from her.

## 2021-06-14 ENCOUNTER — Other Ambulatory Visit: Payer: Self-pay

## 2021-06-14 ENCOUNTER — Encounter: Payer: Self-pay | Admitting: Family

## 2021-06-14 ENCOUNTER — Ambulatory Visit (INDEPENDENT_AMBULATORY_CARE_PROVIDER_SITE_OTHER): Payer: Medicare Other | Admitting: Family

## 2021-06-14 VITALS — BP 138/70 | HR 89 | Temp 97.3°F | Resp 16 | Ht <= 58 in | Wt 96.4 lb

## 2021-06-14 DIAGNOSIS — G301 Alzheimer's disease with late onset: Secondary | ICD-10-CM | POA: Diagnosis not present

## 2021-06-14 DIAGNOSIS — Z0289 Encounter for other administrative examinations: Secondary | ICD-10-CM

## 2021-06-14 DIAGNOSIS — K219 Gastro-esophageal reflux disease without esophagitis: Secondary | ICD-10-CM

## 2021-06-14 DIAGNOSIS — F411 Generalized anxiety disorder: Secondary | ICD-10-CM

## 2021-06-14 DIAGNOSIS — R2681 Unsteadiness on feet: Secondary | ICD-10-CM

## 2021-06-14 DIAGNOSIS — M8949 Other hypertrophic osteoarthropathy, multiple sites: Secondary | ICD-10-CM

## 2021-06-14 DIAGNOSIS — M159 Polyosteoarthritis, unspecified: Secondary | ICD-10-CM

## 2021-06-14 DIAGNOSIS — K449 Diaphragmatic hernia without obstruction or gangrene: Secondary | ICD-10-CM

## 2021-06-14 DIAGNOSIS — K5901 Slow transit constipation: Secondary | ICD-10-CM

## 2021-06-14 DIAGNOSIS — R600 Localized edema: Secondary | ICD-10-CM

## 2021-06-14 DIAGNOSIS — J302 Other seasonal allergic rhinitis: Secondary | ICD-10-CM | POA: Insufficient documentation

## 2021-06-14 DIAGNOSIS — H9193 Unspecified hearing loss, bilateral: Secondary | ICD-10-CM

## 2021-06-14 DIAGNOSIS — I110 Hypertensive heart disease with heart failure: Secondary | ICD-10-CM | POA: Diagnosis not present

## 2021-06-14 DIAGNOSIS — M150 Primary generalized (osteo)arthritis: Secondary | ICD-10-CM | POA: Insufficient documentation

## 2021-06-14 DIAGNOSIS — F0281 Dementia in other diseases classified elsewhere with behavioral disturbance: Secondary | ICD-10-CM

## 2021-06-14 DIAGNOSIS — I5032 Chronic diastolic (congestive) heart failure: Secondary | ICD-10-CM | POA: Diagnosis not present

## 2021-06-14 NOTE — Progress Notes (Signed)
Provider: Marlowe Sax FNP-C  Alwaleed Obeso, Nelda Bucks, NP  Patient Care Team: Arno Cullers, Nelda Bucks, NP as PCP - General (Family Medicine) Josue Hector, MD as PCP - Cardiology (Cardiology) Peggye Form, MD as Referring Physician (Internal Medicine) Garald Balding, MD as Attending Physician (Orthopedic Surgery) Debbra Riding, MD as Consulting Physician (Ophthalmology) Noralee Space, MD as Consulting Physician (Pulmonary Disease)  Extended Emergency Contact Information Primary Emergency Contact: Schrade,Michael Address: [place], [place] [address] [address] Phone: [phone] Relation: Son Secondary Emergency Contact: Chrissie Noa Mobile Phone: [phone] Relation: Other Preferred language: Cleophus Molt Interpreter needed? No  Code Status:  Full Code  Goals of care: Advanced Directive information Advanced Directives 06/14/2021  Does Patient Have a Medical Advance Directive? No  Type of Advance Directive -  Does patient want to make changes to medical advance directive? -  Copy of [place] in Chart? -  Would patient like information on creating a medical advance directive? No - Patient declined     Chief Complaint  Patient presents with   Acute Visit    Patient requesting FL2 form to be filled out.     HPI:  Pt is a 87 y.o. female seen today for an acute visit for FL 2 form completion for Home health Care.She is here with the son.Has a medical History of Hypertension,chronic diastolic congestive Heart Failure,Late onset Alzheimer's dementia without behavioral disturbance,HOH,Hiatal Hernia with GERD,Osteoarthritis of multiple site among others.   States needs assistance with her activity of daily living.Her osteoarthritis on both knees has worsen limiting her walking and unable to stand for a prolong period of time.  Also complicated by chronic congestive heart Failure with shortness of breah on exertion.     Past Medical History:  Diagnosis Date   Abdominal pain, left lower quadrant 06/22/2013   Abnormal CT scan of lung 07/16/2015   Allergic rhinitis, cause unspecified 02/21/2014   Anxiety state, unspecified    Breast cancer ([place])    NO STICK OR BLOOD PRESSURE CHECKS IN LEFT ARM   Carpal tunnel syndrome    Carpal tunnel syndrome on both sides 10/03/2008   Qualifier: Diagnosis of  By: Lenna Gilford MD, Deborra Medina    Cervical spondylosis    Chronic pain syndrome 03/03/2019   Colon polyp 12/19/2009   Transverse-polypoid colorectal mucosa   Constipation    Cough 10/21/2014   Degenerative joint disease    Diastolic dysfunction    Diverticula of colon    Diverticulosis of colon 05/24/2002   Qualifier: Diagnosis of  By: Jerral Ralph     Gastritis, chronic    Hiatal hernia    Hypertension    Low back pain    LPRD (laryngopharyngeal reflux disease) 04/04/2014   Osteoporosis 05/22/2020   RBBB (right bundle branch block with left anterior fascicular block)    Renal cyst    Venous insufficiency    Past Surgical History:  Procedure Laterality Date   CARPAL TUNNEL RELEASE     Left mastectomy      Allergies  Allergen Reactions   Fentanyl Nausea Only and Other (See Comments)    Pt states that she is not allergic     Naprosyn [Naproxen] Other (See Comments)    Patient felt tongue and face swelling. Went to ED where no visible swelling noted    Outpatient Encounter Medications as of 06/14/2021  Medication Sig   acetaminophen (TYLENOL) 500  MG tablet Take 500 mg by mouth every 6 (six) hours as needed for moderate pain or headache.   ALPRAZolam (XANAX) 0.25 MG tablet Take 1 tablet by mouth twice daily as needed for anxiety   carvedilol (COREG) 3.125 MG tablet Take 1 tablet by mouth twice daily   cholecalciferol (VITAMIN D) 1000 units tablet Take 1,000 Units by mouth daily.   famotidine (PEPCID) 20 MG tablet Take 1 tablet (20 mg total) by mouth daily.   feeding supplement, ENSURE ENLIVE, (ENSURE  ENLIVE) LIQD Take 237 mLs by mouth 2 (two) times daily between meals.   furosemide (LASIX) 20 MG tablet TAKE 1/2 (ONE-HALF) TABLET BY MOUTH EVERY OTHER DAY   levocetirizine (XYZAL) 5 MG tablet Take 5 mg by mouth daily.    lidocaine (LIDODERM) 5 % Place 1 patch onto the skin as needed. Remove & Discard patch within 12 hours or as directed by MD   Multiple Vitamins-Minerals (MULTIVITAMINS THER. W/MINERALS) TABS Take 1 tablet by mouth daily.   polyethylene glycol (MIRALAX / GLYCOLAX) 17 g packet Take 17 g by mouth 2 (two) times daily.   Propylene Glycol (SYSTANE COMPLETE OP) Apply 1 drop to eye daily as needed (dry eyes).   sennosides-docusate sodium (SENOKOT-S) 8.6-50 MG tablet Take 1-2 tablets by mouth daily.   spironolactone (ALDACTONE) 25 MG tablet Take 1 tablet (25 mg total) by mouth daily.   zinc oxide (BALMEX) 11.3 % CREA cream Apply 1 application topically 2 (two) times daily as needed.   [DISCONTINUED] lidocaine (LIDODERM) 5 % Place 1 patch onto the skin daily. Remove & Discard patch within 12 hours or as directed by MD   [DISCONTINUED] zinc oxide (BALMEX) 11.3 % CREA cream Apply 1 application topically 2 (two) times daily.   No facility-administered encounter medications on file as of 06/14/2021.    Review of Systems  Constitutional:  Negative for appetite change, chills, fatigue, fever and unexpected weight change.  HENT:  Negative for congestion, dental problem, ear discharge, ear pain, facial swelling, hearing loss, nosebleeds, postnasal drip, rhinorrhea, sinus pressure, sinus pain, sneezing, sore throat, tinnitus and trouble swallowing.   Eyes:  Negative for pain, discharge, redness, itching and visual disturbance.  Respiratory:  Negative for cough, chest tightness, shortness of breath and wheezing.   Cardiovascular:  Positive for leg swelling. Negative for chest pain and palpitations.  Gastrointestinal:  Negative for abdominal distention, abdominal pain, blood in stool, constipation,  diarrhea, nausea and vomiting.  Endocrine: Negative for cold intolerance, heat intolerance, polydipsia, polyphagia and polyuria.  Genitourinary:  Negative for difficulty urinating, dysuria, flank pain, frequency and urgency.  Musculoskeletal:  Positive for arthralgias, back pain and gait problem. Negative for joint swelling, myalgias, neck pain and neck stiffness.  Skin:  Negative for color change, pallor, rash and wound.  Neurological:  Negative for dizziness, syncope, speech difficulty, weakness, light-headedness, numbness and headaches.  Hematological:  Does not bruise/bleed easily.  Psychiatric/Behavioral:  Negative for agitation, behavioral problems, confusion, hallucinations, self-injury, sleep disturbance and suicidal ideas. The patient is not nervous/anxious.    Immunization History  Administered Date(s) Administered   Influenza, High Dose Seasonal PF 08/21/2015   Influenza,inj,Quad PF,6+ Mos 09/18/2015   PFIZER(Purple Top)SARS-COV-2 Vaccination 04/18/2020, 05/10/2020   Pneumococcal Polysaccharide-23 02/07/2015   Pertinent  Health Maintenance Due  Topic Date Due   PNA vac Low Risk Adult (2 of 2 - PCV13) 10/09/2021 (Originally 02/07/2016)   INFLUENZA VACCINE  06/17/2021   DEXA SCAN  Completed   Fall Risk  06/14/2021 05/27/2021 10/30/2020  10/09/2020 05/17/2020  Falls in the past year? 0 0 0 0 1  Number falls in past yr: 0 - 0 0 0  Injury with Fall? 0 0 0 0 0  Risk for fall due to : No Fall Risks No Fall Risks - - -  Follow up Falls evaluation completed Falls evaluation completed - - -   Functional Status Survey:    Vitals:   06/14/21 0941  BP: 138/70  Pulse: 89  Resp: 16  Temp: (!) 97.3 F (36.3 C)  SpO2: 96%  Weight: 96 lb 6.4 oz (43.7 kg)  Height: '4\' 10"'$  (1.473 m)   Body mass index is 20.15 kg/m. Physical Exam Vitals reviewed.  Constitutional:      General: She is not in acute distress.    Appearance: Normal appearance. She is normal weight. She is not  ill-appearing or diaphoretic.  HENT:     Head: Normocephalic.     Right Ear: Tympanic membrane, ear canal and external ear normal. There is no impacted cerumen.     Left Ear: Tympanic membrane, ear canal and external ear normal. There is no impacted cerumen.     Nose: Nose normal. No congestion or rhinorrhea.     Mouth/Throat:     Mouth: Mucous membranes are moist.     Pharynx: Oropharynx is clear. No oropharyngeal exudate or posterior oropharyngeal erythema.  Eyes:     General: No scleral icterus.       Right eye: No discharge.        Left eye: No discharge.     Extraocular Movements: Extraocular movements intact.     Conjunctiva/sclera: Conjunctivae normal.     Pupils: Pupils are equal, round, and reactive to light.  Neck:     Vascular: No carotid bruit.  Cardiovascular:     Rate and Rhythm: Normal rate and regular rhythm.     Pulses: Normal pulses.     Heart sounds: Normal heart sounds. No murmur heard.   No friction rub. No gallop.  Pulmonary:     Effort: Pulmonary effort is normal. No respiratory distress.     Breath sounds: Normal breath sounds. No wheezing, rhonchi or rales.  Chest:     Chest wall: No tenderness.  Abdominal:     General: Bowel sounds are normal. There is no distension.     Palpations: Abdomen is soft. There is no mass.     Tenderness: There is no abdominal tenderness. There is no right CVA tenderness, left CVA tenderness, guarding or rebound.  Musculoskeletal:        General: No swelling or tenderness.     Cervical back: Normal range of motion. No rigidity or tenderness.     Right knee: Crepitus present. No swelling, effusion or erythema. Decreased range of motion. No tenderness.     Left knee: Crepitus present. No swelling, effusion or erythema. Decreased range of motion. No tenderness.     Right lower leg: No edema.     Left lower leg: No edema.  Lymphadenopathy:     Cervical: No cervical adenopathy.  Skin:    General: Skin is warm and dry.      Coloration: Skin is not pale.     Findings: No bruising, erythema, lesion or rash.  Neurological:     Mental Status: She is alert and oriented to person, place, and time.     Cranial Nerves: No cranial nerve deficit.     Sensory: No sensory deficit.     Motor:  No weakness.     Coordination: Coordination normal.     Gait: Gait abnormal.  Psychiatric:        Speech: Speech normal.        Behavior: Behavior normal.        Thought Content: Thought content normal.        Judgment: Judgment normal.     Comments: Memory lapses     Labs reviewed: Recent Labs    12/07/20 1526 04/02/21 1433 06/10/21 1128  NA 140 141 137  K 4.1 4.0 3.6  CL 103 104 104  CO2 33* 29 28  GLUCOSE 95 104 86  BUN '21 25 21  '$ CREATININE 1.06 1.10* 0.76  CALCIUM 9.7 9.2 8.1*   Recent Labs    07/31/20 1207 08/14/20 1701 10/09/20 1339 12/07/20 1526  AST 33 '22 25 27  '$ ALT '16 15 14 18  '$ ALKPHOS 34* 29*  --  33*  BILITOT 1.0 0.6 0.8 0.8  PROT 6.8 6.3* 6.7 7.0  ALBUMIN 3.8 3.5  --  4.3   Recent Labs    10/09/20 1339 12/07/20 1526 06/10/21 1128  WBC 5.0 5.3 4.5  NEUTROABS 2,670 2.9 2.6  HGB 13.0 13.7 11.9*  HCT 39.6 42.1 37.7  MCV 93.4 94.0 98.7  PLT 221 168.0 174   Lab Results  Component Value Date   TSH 3.14 10/09/2020   No results found for: HGBA1C Lab Results  Component Value Date   CHOL 199 02/02/2014   HDL 98.70 02/02/2014   LDLCALC 96 02/02/2014   TRIG 20.0 02/02/2014   CHOLHDL 2 02/02/2014    Significant Diagnostic Results in last 30 days:  CT Renal Stone Study  Result Date: 06/10/2021 CLINICAL DATA:  Frequent urination and left lower quadrant pain for 1 week, symptoms worsened yesterday. EXAM: CT ABDOMEN AND PELVIS WITHOUT CONTRAST TECHNIQUE: Multidetector CT imaging of the abdomen and pelvis was performed following the standard protocol without IV contrast. COMPARISON:  CT abdomen and pelvis 08/14/2020 and 04/27/2018 FINDINGS: Lower chest: Trace bilateral pleural effusions. Heart  size is upper normal. No pericardial effusion. There is some dependent atelectasis. Hepatobiliary: No focal liver abnormality is seen. No gallstones, gallbladder wall thickening, or biliary dilatation. Pancreas: Unremarkable. No pancreatic ductal dilatation or surrounding inflammatory changes. Spleen: Normal in size without focal abnormality. Adrenals/Urinary Tract: The adrenal glands appear normal. Small cyst upper pole right kidney is unchanged. Mild fullness of the intrarenal collecting systems and renal pelves is chronic. The urinary bladder is incompletely distended but otherwise unremarkable. No stones are identified. Stomach/Bowel: Stomach is within normal limits. Appendix appears normal. No evidence of bowel wall thickening, distention, or inflammatory changes. Fairly large stool ball in the rectum noted. Vascular/Lymphatic: Aortic atherosclerosis. No enlarged abdominal or pelvic lymph nodes. Reproductive: Uterus and bilateral adnexa are unremarkable. Other: Mild-to-moderate body wall edema is seen. A trace amount of perihepatic ascites is also present. Musculoskeletal: No acute or focal abnormality. Degenerative disease of the visualized spine and hips noted. IMPRESSION: Body wall edema and a trace amount of perihepatic ascites compatible with volume overload. No finding to explain the patient's urinary tract symptoms. Aortic Atherosclerosis (ICD10-I70.0). Electronically Signed   By: Inge Rise M.D.   On: 06/10/2021 14:55    Assessment/Plan  1. Hypertensive heart disease with chronic diastolic congestive heart failure (HCC) B/p well controlled.  Continue on Carvedilol,spironolactone and furosemide   2. Chronic diastolic congestive heart failure (HCC) Continue on furosemide   3. Late onset Alzheimer's dementia with behavioral disturbance ([place]) Requires  assistance with her ADL's Continue supportive care   4. Hiatal hernia with GERD without esophagitis Continue on Famotidine   5.  Bilateral hearing loss, unspecified hearing loss type Very HOH of hearing no hearing Aids.   6. Generalized anxiety disorder Continue on Alprazolam   7. Primary osteoarthritis involving multiple joints Worst on bilateral knee pain  - continue on tylenol and Lidocaine patch for the back.   8. Edema of both lower extremities Chronic No signs of fluid overload  Continue on Furosemide   9. Slow transit constipation Continue on Mirlax and senokot-s   10. Unsteady gait Remains fall risk  Continue to walk with walk   11. Seasonal allergies Symptoms stable on Levocetirizine   12. Encounter for completion of form with patient FL 2 form completed for Home health service for Assistance with her ADL's. Form copied for scanning and original copy given to patient's son.   Family/ staff Communication: Reviewed plan of care with patient  and son verbalized understanding.   Labs/tests ordered: None   Next Appointment: Has appointment 10/03/2021   Sandrea Hughs, NP

## 2021-06-27 ENCOUNTER — Telehealth: Payer: Self-pay | Admitting: Family

## 2021-06-27 DIAGNOSIS — R600 Localized edema: Secondary | ICD-10-CM

## 2021-06-27 DIAGNOSIS — F0281 Dementia in other diseases classified elsewhere with behavioral disturbance: Secondary | ICD-10-CM

## 2021-06-27 DIAGNOSIS — I110 Hypertensive heart disease with heart failure: Secondary | ICD-10-CM

## 2021-06-27 DIAGNOSIS — I5032 Chronic diastolic (congestive) heart failure: Secondary | ICD-10-CM

## 2021-06-27 DIAGNOSIS — F02818 Dementia in other diseases classified elsewhere, unspecified severity, with other behavioral disturbance: Secondary | ICD-10-CM

## 2021-06-27 DIAGNOSIS — M8949 Other hypertrophic osteoarthropathy, multiple sites: Secondary | ICD-10-CM

## 2021-06-27 DIAGNOSIS — M159 Polyosteoarthritis, unspecified: Secondary | ICD-10-CM

## 2021-06-27 NOTE — Telephone Encounter (Signed)
Case mgr with [facility] calling to see if we can get a home health referral for Heather [name].  She understands the [facility] that was received & completed for adult services, but in 2021 Heather [name] had home health "companion care" through a program that is offered with [facility] & family would like to try & get that again if available.  Would you consider entering a referral for this? Please advise Heather [name]

## 2021-06-27 NOTE — Telephone Encounter (Signed)
Okay.will order [facility] for Companion care

## 2021-07-03 ENCOUNTER — Telehealth: Payer: Self-pay

## 2021-07-03 ENCOUNTER — Other Ambulatory Visit: Payer: Self-pay

## 2021-07-03 DIAGNOSIS — I872 Venous insufficiency (chronic) (peripheral): Secondary | ICD-10-CM

## 2021-07-03 DIAGNOSIS — I5033 Acute on chronic diastolic (congestive) heart failure: Secondary | ICD-10-CM

## 2021-07-03 MED ORDER — FUROSEMIDE 20 MG PO TABS: ORAL_TABLET | ORAL | 1 refills | Status: DC

## 2021-07-03 NOTE — Telephone Encounter (Signed)
Patient's son [name]) called to speak to Lattie Haw about mothers home health referral. [name] states that he was making sure the order for for home health and not nursing home services. I advised him that the order was placed for home health and it was send to [facility] from what I could see in the computer. I advised him that Lattie Haw was out the office and I will route the message to her as well and she may contact him as soon as she returns to the office. [name] states that [facility] contacted him on yesterday, 07/02/2021 and suppose to call him back today, 07/03/2021. Just a [place]

## 2021-07-03 NOTE — Telephone Encounter (Signed)
Patient has request refill on medication "Lasix". Patient last refill on medication was 12/13/2020. Medication sent into pharmacy, [place] per PCP Nget[place], Nelda Bucks, NP.

## 2021-07-17 DIAGNOSIS — G894 Chronic pain syndrome: Secondary | ICD-10-CM | POA: Diagnosis not present

## 2021-07-17 DIAGNOSIS — M150 Primary generalized (osteo)arthritis: Secondary | ICD-10-CM | POA: Diagnosis not present

## 2021-07-17 DIAGNOSIS — K2950 Unspecified chronic gastritis without bleeding: Secondary | ICD-10-CM | POA: Diagnosis not present

## 2021-07-17 DIAGNOSIS — I110 Hypertensive heart disease with heart failure: Secondary | ICD-10-CM | POA: Diagnosis not present

## 2021-07-17 DIAGNOSIS — G301 Alzheimer's disease with late onset: Secondary | ICD-10-CM | POA: Diagnosis not present

## 2021-07-17 DIAGNOSIS — K5790 Diverticulosis of intestine, part unspecified, without perforation or abscess without bleeding: Secondary | ICD-10-CM | POA: Diagnosis not present

## 2021-07-17 DIAGNOSIS — K219 Gastro-esophageal reflux disease without esophagitis: Secondary | ICD-10-CM | POA: Diagnosis not present

## 2021-07-17 DIAGNOSIS — M810 Age-related osteoporosis without current pathological fracture: Secondary | ICD-10-CM | POA: Diagnosis not present

## 2021-07-17 DIAGNOSIS — I5032 Chronic diastolic (congestive) heart failure: Secondary | ICD-10-CM | POA: Diagnosis not present

## 2021-07-17 DIAGNOSIS — M47812 Spondylosis without myelopathy or radiculopathy, cervical region: Secondary | ICD-10-CM | POA: Diagnosis not present

## 2021-07-17 DIAGNOSIS — K5901 Slow transit constipation: Secondary | ICD-10-CM | POA: Diagnosis not present

## 2021-07-17 DIAGNOSIS — J302 Other seasonal allergic rhinitis: Secondary | ICD-10-CM | POA: Diagnosis not present

## 2021-07-17 DIAGNOSIS — F0281 Dementia in other diseases classified elsewhere with behavioral disturbance: Secondary | ICD-10-CM | POA: Diagnosis not present

## 2021-07-17 DIAGNOSIS — I4510 Unspecified right bundle-branch block: Secondary | ICD-10-CM | POA: Diagnosis not present

## 2021-07-17 DIAGNOSIS — K449 Diaphragmatic hernia without obstruction or gangrene: Secondary | ICD-10-CM | POA: Diagnosis not present

## 2021-07-17 DIAGNOSIS — F411 Generalized anxiety disorder: Secondary | ICD-10-CM | POA: Diagnosis not present

## 2021-07-31 ENCOUNTER — Other Ambulatory Visit: Payer: Self-pay | Admitting: *Deleted

## 2021-07-31 DIAGNOSIS — I5033 Acute on chronic diastolic (congestive) heart failure: Secondary | ICD-10-CM

## 2021-07-31 DIAGNOSIS — F411 Generalized anxiety disorder: Secondary | ICD-10-CM

## 2021-07-31 DIAGNOSIS — I872 Venous insufficiency (chronic) (peripheral): Secondary | ICD-10-CM

## 2021-07-31 MED ORDER — ALPRAZOLAM 0.25 MG PO TABS: 0.2500 mg | ORAL_TABLET | Freq: Two times a day (BID) | ORAL | 0 refills | Status: DC | PRN

## 2021-07-31 MED ORDER — SPIRONOLACTONE 25 MG PO TABS: 25.0000 mg | ORAL_TABLET | Freq: Every day | ORAL | 0 refills | Status: DC

## 2021-07-31 NOTE — Telephone Encounter (Signed)
Crystal with [facility] called stating that patient needs refills on medications.  Epic LR: 12/13/2020 No contract on File, added note to upcoming appointment.  Pended Rx's and sent to [facility] for approval.

## 2021-08-02 ENCOUNTER — Other Ambulatory Visit: Payer: Self-pay

## 2021-08-02 ENCOUNTER — Ambulatory Visit (INDEPENDENT_AMBULATORY_CARE_PROVIDER_SITE_OTHER): Payer: Medicare Other | Admitting: Family

## 2021-08-02 ENCOUNTER — Telehealth: Payer: Self-pay

## 2021-08-02 ENCOUNTER — Encounter: Payer: Self-pay | Admitting: Family

## 2021-08-02 DIAGNOSIS — Z0000 Encounter for general adult medical examination without abnormal findings: Secondary | ICD-10-CM

## 2021-08-02 NOTE — Patient Instructions (Signed)
Heather [name] , Thank you for taking time to come for your Medicare Wellness Visit. I appreciate your ongoing commitment to your health goals. Please review the following plan we discussed and let me know if I can assist you in the future.   Screening recommendations/referrals: Colonoscopy N/A  Mammogram N/A  Bone Density Up to date  Recommended yearly ophthalmology/optometry visit for glaucoma screening and checkup Recommended yearly dental visit for hygiene and checkup  Vaccinations: Influenza vaccine Due  Pneumococcal vaccine Up to date  Tdap vaccine : Due please get Tetanus vaccine at your pharmacy  Shingles vaccine: Due.Please get Shingrix vaccine at your pharmacy    Advanced directives: Yes please bring copy to the office   Conditions/risks identified: Advance age female > 47 yrs,Hypertension   Next appointment: 1 year    Preventive Care 67 Years and Older, Female Preventive care refers to lifestyle choices and visits with your health care provider that can promote health and wellness. What does preventive care include? A yearly physical exam. This is also called an annual well check. Dental exams once or twice a year. Routine eye exams. Ask your health care provider how often you should have your eyes checked. Personal lifestyle choices, including: Daily care of your teeth and gums. Regular physical activity. Eating a healthy diet. Avoiding tobacco and drug use. Limiting alcohol use. Practicing safe sex. Taking low-dose aspirin every day. Taking vitamin and mineral supplements as recommended by your health care provider. What happens during an annual well check? The services and screenings done by your health care provider during your annual well check will depend on your age, overall health, lifestyle risk factors, and family history of disease. Counseling  Your health care provider may ask you questions about your: Alcohol use. Tobacco use. Drug use. Emotional  well-being. Home and relationship well-being. Sexual activity. Eating habits. History of falls. Memory and ability to understand (cognition). Work and work Statistician. Reproductive health. Screening  You may have the following tests or measurements: Height, weight, and BMI. Blood pressure. Lipid and cholesterol levels. These may be checked every 5 years, or more frequently if you are over 62 years old. Skin check. Lung cancer screening. You may have this screening every year starting at age 49 if you have a 30-pack-year history of smoking and currently smoke or have quit within the past 15 years. Fecal occult blood test (FOBT) of the stool. You may have this test every year starting at age 31. Flexible sigmoidoscopy or colonoscopy. You may have a sigmoidoscopy every 5 years or a colonoscopy every 10 years starting at age 46. Hepatitis C blood test. Hepatitis B blood test. Sexually transmitted disease (STD) testing. Diabetes screening. This is done by checking your blood sugar (glucose) after you have not eaten for a while (fasting). You may have this done every 1-3 years. Bone density scan. This is done to screen for osteoporosis. You may have this done starting at age 62. Mammogram. This may be done every 1-2 years. Talk to your health care provider about how often you should have regular mammograms. Talk with your health care provider about your test results, treatment options, and if necessary, the need for more tests. Vaccines  Your health care provider may recommend certain vaccines, such as: Influenza vaccine. This is recommended every year. Tetanus, diphtheria, and acellular pertussis (Tdap, Td) vaccine. You may need a Td booster every 10 years. Zoster vaccine. You may need this after age 51. Pneumococcal 13-valent conjugate (PCV13) vaccine. One dose  is recommended after age 26. Pneumococcal polysaccharide (PPSV23) vaccine. One dose is recommended after age 77. Talk to your  health care provider about which screenings and vaccines you need and how often you need them. This information is not intended to replace advice given to you by your health care provider. Make sure you discuss any questions you have with your health care provider. Document Released: 11/30/2015 Document Revised: 07/23/2016 Document Reviewed: 09/04/2015 Elsevier Interactive Patient Education  2017 [employer] Prevention in the Home Falls can cause injuries. They can happen to people of all ages. There are many things you can do to make your home safe and to help prevent falls. What can I do on the outside of my home? Regularly fix the edges of walkways and driveways and fix any cracks. Remove anything that might make you trip as you walk through a door, such as a raised step or threshold. Trim any bushes or trees on the path to your home. Use bright outdoor lighting. Clear any walking paths of anything that might make someone trip, such as rocks or tools. Regularly check to see if handrails are loose or broken. Make sure that both sides of any steps have handrails. Any raised decks and porches should have guardrails on the edges. Have any leaves, snow, or ice cleared regularly. Use sand or salt on walking paths during winter. Clean up any spills in your garage right away. This includes oil or grease spills. What can I do in the bathroom? Use night lights. Install grab bars by the toilet and in the tub and shower. Do not use towel bars as grab bars. Use non-skid mats or decals in the tub or shower. If you need to sit down in the shower, use a plastic, non-slip stool. Keep the floor dry. Clean up any water that spills on the floor as soon as it happens. Remove soap buildup in the tub or shower regularly. Attach bath mats securely with double-sided non-slip rug tape. Do not have throw rugs and other things on the floor that can make you trip. What can I do in the bedroom? Use night  lights. Make sure that you have a light by your bed that is easy to reach. Do not use any sheets or blankets that are too big for your bed. They should not hang down onto the floor. Have a firm chair that has side arms. You can use this for support while you get dressed. Do not have throw rugs and other things on the floor that can make you trip. What can I do in the kitchen? Clean up any spills right away. Avoid walking on wet floors. Keep items that you use a lot in easy-to-reach places. If you need to reach something above you, use a strong step stool that has a grab bar. Keep electrical cords out of the way. Do not use floor polish or wax that makes floors slippery. If you must use wax, use non-skid floor wax. Do not have throw rugs and other things on the floor that can make you trip. What can I do with my stairs? Do not leave any items on the stairs. Make sure that there are handrails on both sides of the stairs and use them. Fix handrails that are broken or loose. Make sure that handrails are as long as the stairways. Check any carpeting to make sure that it is firmly attached to the stairs. Fix any carpet that is loose or worn. Avoid  having throw rugs at the top or bottom of the stairs. If you do have throw rugs, attach them to the floor with carpet tape. Make sure that you have a light switch at the top of the stairs and the bottom of the stairs. If you do not have them, ask someone to add them for you. What else can I do to help prevent falls? Wear shoes that: Do not have high heels. Have rubber bottoms. Are comfortable and fit you well. Are closed at the toe. Do not wear sandals. If you use a stepladder: Make sure that it is fully opened. Do not climb a closed stepladder. Make sure that both sides of the stepladder are locked into place. Ask someone to hold it for you, if possible. Clearly mark and make sure that you can see: Any grab bars or handrails. First and last  steps. Where the edge of each step is. Use tools that help you move around (mobility aids) if they are needed. These include: Canes. Walkers. Scooters. Crutches. Turn on the lights when you go into a dark area. Replace any light bulbs as soon as they burn out. Set up your furniture so you have a clear path. Avoid moving your furniture around. If any of your floors are uneven, fix them. If there are any pets around you, be aware of where they are. Review your medicines with your doctor. Some medicines can make you feel dizzy. This can increase your chance of falling. Ask your doctor what other things that you can do to help prevent falls. This information is not intended to replace advice given to you by your health care provider. Make sure you discuss any questions you have with your health care provider. Document Released: 08/30/2009 Document Revised: 04/10/2016 Document Reviewed: 12/08/2014 Elsevier Interactive Patient Education  2017 [employer].

## 2021-08-02 NOTE — Telephone Encounter (Signed)
[name], coryell are scheduled for a virtual visit with your provider today.    Just as we do with appointments in the office, we must obtain your consent to participate.  Your consent will be active for this visit and any virtual visit you may have with one of our providers in the next 365 days.    If you have a MyChart account, I can also send a copy of this consent to you electronically.  All virtual visits are billed to your insurance company just like a traditional visit in the office.  As this is a virtual visit, video technology does not allow for your provider to perform a traditional examination.  This may limit your provider's ability to fully assess your condition.  If your provider identifies any concerns that need to be evaluated in person or the need to arrange testing such as labs, EKG, etc, we will make arrangements to do so.    Although advances in technology are sophisticated, we cannot ensure that it will always work on either your end or our end.  If the connection with a video visit is poor, we may have to switch to a telephone visit.  With either a video or telephone visit, we are not always able to ensure that we have a secure connection.   I need to obtain your verbal consent now.   Are you willing to proceed with your visit today?   Heather [name] has provided verbal consent on 08/02/2021 for a virtual visit (video or telephone).   Carroll Kinds, CMA 08/02/2021  2:16 PM

## 2021-08-02 NOTE — Progress Notes (Signed)
This service is provided via telemedicine  No vital signs collected/recorded due to the encounter was a telemedicine visit.   Location of patient (ex: home, work):  Home  Patient consents to a telephone visit:  Yes, see encounter dated 08/02/2021  Location of the provider (ex: office, home):  [facility]  Name of any referring provider:  Ngetich,Dinah, NP  Names of all persons participating in the telemedicine service and their role in the encounter:  Marlowe Sax, NP, Carroll Kinds, CMA, patient and son, [name].  Time spent on call:  11 minutes with medical assistant

## 2021-08-02 NOTE — Progress Notes (Signed)
Subjective:   Heather [name] is a 87 y.o. female who presents for Medicare Annual (Subsequent) preventive examination.  Review of Systems     Cardiac Risk Factors include: advanced age (>97mn, >>13women);hypertension     Objective:    There were no vitals filed for this visit. There is no height or weight on file to calculate BMI.  Advanced Directives 08/02/2021 06/14/2021 06/10/2021 05/27/2021 10/30/2020 10/09/2020 08/14/2020  Does Patient Have a Medical Advance Directive? Yes No No No No Yes Yes  Type of Advance Directive Living will - - - - Living will Living will  Does patient want to make changes to medical advance directive? No - Patient declined - - - No - Patient declined No - Patient declined -  Copy of [place] Chart? - - - - - - -  Would patient like information on creating a medical advance directive? - No - Patient declined No - Patient declined No - Patient declined - - -    Current Medications (verified) Outpatient Encounter Medications as of 08/02/2021  Medication Sig   acetaminophen (TYLENOL) 500 MG tablet Take 500 mg by mouth every 6 (six) hours as needed for moderate pain or headache.   ALPRAZolam (XANAX) 0.25 MG tablet Take 1 tablet (0.25 mg total) by mouth 2 (two) times daily as needed. for anxiety   carvedilol (COREG) 3.125 MG tablet Take 1 tablet by mouth twice daily   cholecalciferol (VITAMIN D) 1000 units tablet Take 1,000 Units by mouth daily.   famotidine (PEPCID) 20 MG tablet Take 1 tablet (20 mg total) by mouth daily.   feeding supplement, ENSURE ENLIVE, (ENSURE ENLIVE) LIQD Take 237 mLs by mouth 2 (two) times daily between meals.   furosemide (LASIX) 20 MG tablet TAKE 1/2 (ONE-HALF) TABLET BY MOUTH EVERY OTHER DAY   levocetirizine (XYZAL) 5 MG tablet Take 5 mg by mouth daily.    lidocaine (LIDODERM) 5 % Place 1 patch onto the skin as needed. Remove & Discard patch within 12 hours or as directed by MD   Multiple Vitamins-Minerals  (MULTIVITAMINS THER. W/MINERALS) TABS Take 1 tablet by mouth daily.   polyethylene glycol (MIRALAX / GLYCOLAX) 17 g packet Take 17 g by mouth 2 (two) times daily.   Propylene Glycol (SYSTANE COMPLETE OP) Apply 1 drop to eye daily as needed (dry eyes).   sennosides-docusate sodium (SENOKOT-S) 8.6-50 MG tablet Take 1-2 tablets by mouth daily.   spironolactone (ALDACTONE) 25 MG tablet Take 1 tablet (25 mg total) by mouth daily.   zinc oxide (BALMEX) 11.3 % CREA cream Apply 1 application topically 2 (two) times daily as needed.   No facility-administered encounter medications on file as of 08/02/2021.    Allergies (verified) Fentanyl and Naprosyn [naproxen]   History: Past Medical History:  Diagnosis Date   Abdominal pain, left lower quadrant 06/22/2013   Abnormal CT scan of lung 07/16/2015   Allergic rhinitis, cause unspecified 02/21/2014   Anxiety state, unspecified    Breast cancer ([facility]    NO STICK OR BLOOD PRESSURE CHECKS IN LEFT ARM   Carpal tunnel syndrome    Carpal tunnel syndrome on both sides 10/03/2008   Qualifier: Diagnosis of  By: NLenna GilfordMD, SDeborra Medina   Cervical spondylosis    Chronic pain syndrome 03/03/2019   Colon polyp 12/19/2009   Transverse-polypoid colorectal mucosa   Constipation    Cough 10/21/2014   Degenerative joint disease    Diastolic dysfunction    Diverticula  of colon    Diverticulosis of colon 05/24/2002   Qualifier: Diagnosis of  By: Jerral Ralph     Gastritis, chronic    Hiatal hernia    Hypertension    Low back pain    LPRD (laryngopharyngeal reflux disease) 04/04/2014   Osteoporosis 05/22/2020   RBBB (right bundle branch block with left anterior fascicular block)    Renal cyst    Venous insufficiency    Past Surgical History:  Procedure Laterality Date   CARPAL TUNNEL RELEASE     Left mastectomy     Family History  Problem Relation Age of Onset   Colon cancer Neg Hx    Pancreatic cancer Neg Hx    Esophageal cancer Neg Hx    Stomach  cancer Neg Hx    Social History   Socioeconomic History   Marital status: Widowed    Spouse name: Not on file   Number of children: 1   Years of education: Not on file   Highest education level: Not on file  Occupational History   Occupation: Retired    Fish farm manager: RETIRED   Occupation: Certified Nursing Assistance  Tobacco Use   Smoking status: Never   Smokeless tobacco: Never  Vaping Use   Vaping Use: Never used  Substance and Sexual Activity   Alcohol use: No    Alcohol/week: 0.0 standard drinks   Drug use: No   Sexual activity: Not on file  Other Topics Concern   Not on file  Social History Narrative    Social History 07/09/2017        Patient is hard of hearing, uses a 4 leg cane or walker, on wait list for meals on wheels (12/29/19)   Who lives at home: Patient lives alone in one level home; son lives across the street     Transportation: son and family transport to appointments      Important Relationships Son, Friends Jerlyn Ly and Kalman Shan 07/09/2017    Pets: None 07/09/2017   Education / Work:  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 Ducatte, RN, BSN        Casimer Lanius, LCSW   Clinical Social Worker   Updated 12/29/2019                                                                                          Tobacco use, amount per day now: No   Past tobacco use, amount per day: No   How many years did you use tobacco: No   Alcohol use (drinks per week): No   Diet: No   Do you drink/eat things with caffeine: Yes, sometimes.   Marital status: Widow                                  What year were you married?    Do you live in a  house, apartment, assisted living, condo, trailer, etc.? House   Is it one or more stories? One.   How many persons live in your home? 1    Do you have pets in your home?( please list) No   Highest Level of education completed?   Current or past profession: Actuary.   Do you exercise? No                                 Type and how often?   Do you have a living will? Yes.   Do you have a DNR form? No                                  If not, do you want to discuss one?   Do you have signed POA/HPOA forms?                        If so, please bring to you appointment      Do you have any difficulty bathing or dressing yourself? No.   Do you have any difficulty preparing food or eating? No.   Do you have any difficulty managing your medications? Yes.   Do you have any difficulty managing your finances? No.   Do you have any difficulty affording your medications?    Social Determinants of Health   Financial Resource Strain: Not on file  Food Insecurity: Not on file  Transportation Needs: Not on file  Physical Activity: Not on file  Stress: Not on file  Social Connections: Not on file    Tobacco Counseling Counseling given: Not Answered   Clinical Intake:  Pre-visit preparation completed: No  Pain : 0-10 Pain Type: Chronic pain Pain Location: Other (Comment) (side) Pain Radiating Towards: no Pain Descriptors / Indicators: Aching Pain Onset: Other (comment) (several years) Pain Frequency: Constant Pain Relieving Factors: Tylenol Effect of Pain on Daily Activities: yes  Pain Relieving Factors: Tylenol  BMI - recorded: 20.15 Nutritional Status: BMI of 19-24  Normal Nutritional Risks: None Diabetes: No  How often do you need to have someone help you when you read instructions, pamphlets, or other written materials from your doctor or pharmacy?: 1 - Never What is the last grade level you completed in school?: 12 grade  Diabetic?No   Interpreter Needed?: No      Activities of Daily Living In your present state of health, do you have any difficulty performing the following activities: 08/02/2021   Hearing? Y  Comment HOH  Vision? N  Difficulty concentrating or making decisions? Y  Comment Remembering  Walking or climbing stairs? Y  Dressing or bathing? Y  Comment assist  Doing errands, shopping? Y  Comment son [place] and eating ? Y  Comment son assist  Using the Toilet? Y  In the past six months, have you accidently leaked urine? Y  Comment wears pull ups  Do you have problems with loss of bowel control? N  Managing your Medications? Y  Comment son assist  Managing your Finances? Y  Comment son  Housekeeping or managing your Housekeeping? Y  Comment son  Some recent data might be hidden    Patient Care Team: Maguire Killmer, Nelda Bucks, NP as PCP - General (Family Medicine) Josue Hector, MD as PCP - Cardiology (Cardiology) Peggye Form, MD as  Referring Physician (Internal Medicine) Garald Balding, MD as Attending Physician (Orthopedic Surgery) Debbra Riding, MD as Consulting Physician (Ophthalmology) Noralee Space, MD as Consulting Physician (Pulmonary Disease)  Indicate any recent Medical Services you may have received from other than [facility] providers in the past year (date may be approximate).     Assessment:   This is a routine wellness examination for Heather [name].  Hearing/Vision screen Hearing Screening - Comments:: Patient has hearing problems, but does not wear hearing aids. Vision Screening - Comments:: Patient has no vision problems. Unknown when last eye exam was.  Dietary issues and exercise activities discussed: Current Exercise Habits: Home exercise routine, Type of exercise: walking, Time (Minutes): 10, Frequency (Times/Week): 3, Weekly Exercise (Minutes/Week): 30, Intensity: Mild, Exercise limited by: Other - see comments (uses a walker)   Goals Addressed             This Visit's Progress    Keep walking 10 minutes daily   On track    needs Advance Directives   Not on track    Current Barriers:  Patient with CHF, HTN, and  Dementia does not have an Advance Directive Acknowledges deficits, education and support in order to complete this document Limited education about the importance of naming a healthcare power of attorney Clinical Social Work Goal(s):  Over the next 45 days, the patient will  work with care management team on completion of Advance Directive  Interventions provided by LCSW: A voluntary discussion about advanced care planning was attempted today will continue discussion with family during office visit Patient Self Care Activities:  Is unable to complete documentation independently Able to identify next of kin or [place] of Attorney/Health Care Agent Initial goal documentation       Depression Screen PHQ 2/9 Scores 08/02/2021 05/17/2020 04/30/2020 02/22/2020 01/12/2020 08/04/2019 06/29/2019  PHQ - 2 Score 0 0 0 0 0 0 0    Fall Risk Fall Risk  08/02/2021 06/14/2021 05/27/2021 10/30/2020 10/09/2020  Falls in the past year? 0 0 0 0 0  Number falls in past yr: 0 0 - 0 0  Injury with Fall? 0 0 0 0 0  Risk for fall due to : No Fall Risks No Fall Risks No Fall Risks - -  Follow up Falls evaluation completed Falls evaluation completed Falls evaluation completed - -    FALL RISK PREVENTION PERTAINING TO THE HOME:  Any stairs in or around the home? Yes  If so, are there any without handrails? Yes  Home free of loose throw rugs in walkways, pet beds, electrical cords, etc? No  Adequate lighting in your home to reduce risk of falls? Yes   ASSISTIVE DEVICES UTILIZED TO PREVENT FALLS:  Life alert? No  Use of a cane, walker or w/c? Yes  Grab bars in the bathroom? Yes  Shower chair or bench in shower? Yes  Elevated toilet seat or a handicapped toilet? Yes   TIMED UP AND GO:  Was the test performed? No .  Length of time to ambulate 10 feet: N/A sec.   Gait slow and steady with assistive device  Cognitive Function: MMSE - Mini Mental State Exam 09/30/2016  Orientation to time 5   Orientation to Place 5  Registration 3  Attention/ Calculation 0  Recall 0  Language- name 2 objects 2  Language- repeat 1  Language- follow 3 step command 3  Language- read & follow direction 1  Write a sentence 1  Copy design 1  Total score 22     6CIT Screen 08/02/2021  What Year? 4 points  What month? 0 points  What time? 0 points  Count back from 20 4 points  Months in reverse 4 points  Repeat phrase 10 points  Total Score 22    Immunizations Immunization History  Administered Date(s) Administered   Influenza, High Dose Seasonal PF 08/21/2015   Influenza,inj,Quad PF,6+ Mos 09/18/2015   PFIZER(Purple Top)SARS-COV-2 Vaccination 04/18/2020, 05/10/2020   Pneumococcal Polysaccharide-23 02/07/2015    TDAP status: Due, Education has been provided regarding the importance of this vaccine. Advised may receive this vaccine at local pharmacy or Health Dept. Aware to provide a copy of the vaccination record if obtained from local pharmacy or Health Dept. Verbalized acceptance and understanding.  Flu Vaccine status: Due, Education has been provided regarding the importance of this vaccine. Advised may receive this vaccine at local pharmacy or Health Dept. Aware to provide a copy of the vaccination record if obtained from local pharmacy or Health Dept. Verbalized acceptance and understanding.  Pneumococcal vaccine status: Due, Education has been provided regarding the importance of this vaccine. Advised may receive this vaccine at local pharmacy or Health Dept. Aware to provide a copy of the vaccination record if obtained from local pharmacy or Health Dept. Verbalized acceptance and understanding.  Covid-19 vaccine status: Information provided on how to obtain vaccines.   Qualifies for Shingles Vaccine? Yes   Zostavax completed No   Shingrix Completed?: No.    Education has been provided regarding the importance of this vaccine. Patient has been advised to call insurance company to  determine out of pocket expense if they have not yet received this vaccine. Advised may also receive vaccine at local pharmacy or Health Dept. Verbalized acceptance and understanding.  Screening Tests Health Maintenance  Topic Date Due   Zoster Vaccines- Shingrix (1 of 2) Never done   COVID-19 Vaccine (3 - Pfizer risk series) 06/07/2020   INFLUENZA VACCINE  06/17/2021   TETANUS/TDAP  10/09/2021 (Originally 06/13/1937)   DEXA SCAN  Completed   HPV VACCINES  Aged Out    Health Maintenance  Health Maintenance Due  Topic Date Due   Zoster Vaccines- Shingrix (1 of 2) Never done   COVID-19 Vaccine (3 - Pfizer risk series) 06/07/2020   INFLUENZA VACCINE  06/17/2021    Colorectal cancer screening: No longer required.   Mammogram status: No longer required due to advance .  Bone Density status: Completed 03/05/2017. Results reflect: Bone density results: OSTEOPOROSIS. Repeat every N/A  years.  Lung Cancer Screening: (Low Dose CT Chest recommended if Age 55-80 years, 30 pack-year currently smoking OR have quit w/in 15years.) does not qualify.   Lung Cancer Screening Referral: No   Additional Screening:  Hepatitis C Screening: does not qualify; Completed No   Vision Screening: Recommended annual ophthalmology exams for early detection of glaucoma and other disorders of the eye. Is the patient up to date with their annual eye exam?  Yes  Who is the provider or what is the name of the office in which the patient attends annual eye exams? Does not recall provider's name  If pt is not established with a provider, would they like to be referred to a provider to establish care? No .   Dental Screening: Recommended annual dental exams for proper oral hygiene  Community Resource Referral / Chronic Care Management: CRR required this visit?  No   CCM required this visit?  No  Plan:     I have personally reviewed and noted the following in the patient's chart:   Medical and social  history Use of alcohol, tobacco or illicit drugs  Current medications and supplements including opioid prescriptions.  Functional ability and status Nutritional status Physical activity Advanced directives List of other physicians Hospitalizations, surgeries, and ER visits in previous 12 months Vitals Screenings to include cognitive, depression, and falls Referrals and appointments  In addition, I have reviewed and discussed with patient certain preventive protocols, quality metrics, and best practice recommendations. A written personalized care plan for preventive services as well as general preventive health recommendations were provided to patient.     Sandrea Hughs, NP   08/02/2021   Nurse Notes: she will get her COVID-19 vaccine,Tdap  and Zoster vaccine at her pharmacy. Will come to office to receive her Influenza vaccine.

## 2021-08-05 ENCOUNTER — Ambulatory Visit: Payer: Medicare Other | Admitting: Family

## 2021-08-09 ENCOUNTER — Ambulatory Visit: Payer: Medicare Other | Admitting: Family

## 2021-08-12 ENCOUNTER — Ambulatory Visit: Payer: Medicare Other | Admitting: Family

## 2021-08-19 ENCOUNTER — Ambulatory Visit (INDEPENDENT_AMBULATORY_CARE_PROVIDER_SITE_OTHER): Payer: Medicare Other | Admitting: Family

## 2021-08-19 ENCOUNTER — Other Ambulatory Visit: Payer: Self-pay

## 2021-08-19 ENCOUNTER — Encounter: Payer: Self-pay | Admitting: Family

## 2021-08-19 VITALS — BP 100/60 | HR 82 | Temp 97.3°F | Resp 16 | Ht <= 58 in | Wt 115.6 lb

## 2021-08-19 DIAGNOSIS — I872 Venous insufficiency (chronic) (peripheral): Secondary | ICD-10-CM

## 2021-08-19 DIAGNOSIS — R2681 Unsteadiness on feet: Secondary | ICD-10-CM

## 2021-08-19 DIAGNOSIS — I5032 Chronic diastolic (congestive) heart failure: Secondary | ICD-10-CM

## 2021-08-19 DIAGNOSIS — R634 Abnormal weight loss: Secondary | ICD-10-CM | POA: Diagnosis not present

## 2021-08-19 DIAGNOSIS — Z23 Encounter for immunization: Secondary | ICD-10-CM | POA: Diagnosis not present

## 2021-08-19 MED ORDER — LEVOCETIRIZINE DIHYDROCHLORIDE 5 MG PO TABS: 5.0000 mg | ORAL_TABLET | Freq: Every day | ORAL | 2 refills | Status: DC

## 2021-08-19 NOTE — Progress Notes (Signed)
Provider: Marlowe Sax FNP-C  Zafira Munos, Nelda Bucks, NP  Patient Care Team: Honour Schwieger, Nelda Bucks, NP as PCP - General (Family Medicine) Josue Hector, MD as PCP - Cardiology (Cardiology) Peggye Form, MD as Referring Physician (Internal Medicine) Garald Balding, MD as Attending Physician (Orthopedic Surgery) Debbra Riding, MD as Consulting Physician (Ophthalmology) Noralee Space, MD as Consulting Physician (Pulmonary Disease)  Extended Emergency Contact Information Primary Emergency Contact: Bodey,Michael Address: [place], [place] [address] [address] Phone: [phone] Relation: Son Secondary Emergency Contact: Chrissie Noa Mobile Phone: [phone] Relation: Other Preferred language: Cleophus Molt Interpreter needed? No  Code Status:  DNR Goals of care: Advanced Directive information Advanced Directives 08/19/2021  Does Patient Have a Medical Advance Directive? Yes  Type of Advance Directive Living will  Does patient want to make changes to medical advance directive? No - Patient declined  Copy of [place] in Chart? -  Would patient like information on creating a medical advance directive? -     Chief Complaint  Patient presents with  . Medical Management of Chronic Issues    2 month follow up.  . Immunizations    Discuss the need for Shingrix vaccine, Influenza vaccine, and Covid Booster.    HPI:  Pt is a 87 y.o. female seen today for an acute visit for 2 months follow up for weight loss.she is here with her son.states completed physical Therapy.Had Nurse Aide that was assisting with her bathing.Nurse Aide stopped when physical therapy was completed but would like more assistance with her Lennice Sites continues to walk with Rolator.No fall episode reported.  Son states has been drinking Ensure protein at least once daily.States sometimes gets up around 12 pm and skips her breakfast not eating well.she  gets meals on wheels. Argues with the son over times of meals." I'm 11 yrs old I can eat whenever I want".Tells son to shut up whenever he tries to advise her. Also argues over her diuretic instructed to take as directed 20 mg tablet every other day but states no fluid pill works if taken every other day.Son states takes daily. Denies any worsening cough,edema or shortness of breath.    Past Medical History:  Diagnosis Date  . Abdominal pain, left lower quadrant 06/22/2013  . Abnormal CT scan of lung 07/16/2015  . Allergic rhinitis, cause unspecified 02/21/2014  . Anxiety state, unspecified   . Breast cancer ([place])    NO STICK OR BLOOD PRESSURE CHECKS IN LEFT ARM  . Carpal tunnel syndrome   . Carpal tunnel syndrome on both sides 10/03/2008   Qualifier: Diagnosis of  By: Lenna Gilford MD, Deborra Medina   . Cervical spondylosis   . Chronic pain syndrome 03/03/2019  . Colon polyp 12/19/2009   Transverse-polypoid colorectal mucosa  . Constipation   . Cough 10/21/2014  . Degenerative joint disease   . Diastolic dysfunction   . Diverticula of colon   . Diverticulosis of colon 05/24/2002   Qualifier: Diagnosis of  By: Jerral Ralph    . Gastritis, chronic   . Hiatal hernia   . Hypertension   . Low back pain   . LPRD (laryngopharyngeal reflux disease) 04/04/2014  . Osteoporosis 05/22/2020  . RBBB (right bundle branch block with left anterior fascicular block)   . Renal cyst   . Venous insufficiency    Past Surgical History:  Procedure Laterality Date  . CARPAL TUNNEL RELEASE    .  Left mastectomy      Allergies  Allergen Reactions  . Fentanyl Nausea Only and Other (See Comments)    Pt states that she is not allergic    . Naprosyn [Naproxen] Other (See Comments)    Patient felt tongue and face swelling. Went to ED where no visible swelling noted    Outpatient Encounter Medications as of 08/19/2021  Medication Sig  . acetaminophen (TYLENOL) 500 MG tablet Take 500 mg by mouth every 6 (six)  hours as needed for moderate pain or headache.  . ALPRAZolam (XANAX) 0.25 MG tablet Take 1 tablet (0.25 mg total) by mouth 2 (two) times daily as needed. for anxiety  . carvedilol (COREG) 3.125 MG tablet Take 1 tablet by mouth twice daily  . cholecalciferol (VITAMIN D) 1000 units tablet Take 1,000 Units by mouth daily.  . famotidine (PEPCID) 20 MG tablet Take 1 tablet (20 mg total) by mouth daily.  . feeding supplement, ENSURE ENLIVE, (ENSURE ENLIVE) LIQD Take 237 mLs by mouth 2 (two) times daily between meals.  . furosemide (LASIX) 20 MG tablet TAKE 1/2 (ONE-HALF) TABLET BY MOUTH EVERY OTHER DAY  . levocetirizine (XYZAL) 5 MG tablet Take 5 mg by mouth daily.   Marland Kitchen lidocaine (LIDODERM) 5 % Place 1 patch onto the skin as needed. Remove & Discard patch within 12 hours or as directed by MD  . Multiple Vitamins-Minerals (MULTIVITAMINS THER. W/MINERALS) TABS Take 1 tablet by mouth daily.  . polyethylene glycol (MIRALAX / GLYCOLAX) 17 g packet Take 17 g by mouth 2 (two) times daily.  Marland Kitchen Propylene Glycol (SYSTANE COMPLETE OP) Apply 1 drop to eye daily as needed (dry eyes).  . sennosides-docusate sodium (SENOKOT-S) 8.6-50 MG tablet Take 1-2 tablets by mouth daily.  Marland Kitchen spironolactone (ALDACTONE) 25 MG tablet Take 1 tablet (25 mg total) by mouth daily.  Marland Kitchen zinc oxide (BALMEX) 11.3 % CREA cream Apply 1 application topically 2 (two) times daily as needed.   No facility-administered encounter medications on file as of 08/19/2021.    Review of Systems  Constitutional:  Negative for appetite change, chills, fatigue, fever and unexpected weight change.  HENT:  Negative for congestion, dental problem, ear discharge, ear pain, facial swelling, hearing loss, nosebleeds, postnasal drip, rhinorrhea, sinus pressure, sinus pain, sneezing, sore throat, tinnitus and trouble swallowing.   Respiratory:  Negative for cough, chest tightness, shortness of breath and wheezing.   Cardiovascular:  Positive for leg swelling.  Negative for chest pain and palpitations.  Gastrointestinal:  Negative for abdominal distention, abdominal pain, constipation, diarrhea, nausea and vomiting.  Genitourinary:  Negative for difficulty urinating and dysuria.       Incontinent   Musculoskeletal:  Positive for arthralgias and gait problem. Negative for back pain, joint swelling, myalgias, neck pain and neck stiffness.  Skin:  Negative for color change, pallor, rash and wound.  Neurological:  Negative for dizziness, syncope, speech difficulty, weakness, light-headedness, numbness and headaches.  Hematological:  Does not bruise/bleed easily.  Psychiatric/Behavioral:  Negative for agitation, behavioral problems, confusion, hallucinations, self-injury, sleep disturbance and suicidal ideas. The patient is not nervous/anxious.    Immunization History  Administered Date(s) Administered  . Fluad Quad(high Dose 65+) 08/19/2021  . Influenza, High Dose Seasonal PF 08/21/2015  . Influenza,inj,Quad PF,6+ Mos 09/18/2015  . PFIZER(Purple Top)SARS-COV-2 Vaccination 04/18/2020, 05/10/2020  . Pneumococcal Polysaccharide-23 02/07/2015   Pertinent  Health Maintenance Due  Topic Date Due  . INFLUENZA VACCINE  Completed  . DEXA SCAN  Completed   Fall Risk  08/19/2021 08/02/2021 06/14/2021 05/27/2021 10/30/2020  Falls in the past year? 0 0 0 0 0  Number falls in past yr: 0 0 0 - 0  Injury with Fall? 0 0 0 0 0  Risk for fall due to : No Fall Risks No Fall Risks No Fall Risks No Fall Risks -  Follow up Falls evaluation completed Falls evaluation completed Falls evaluation completed Falls evaluation completed -   Functional Status Survey:    Vitals:   08/19/21 1440  BP: 100/60  Pulse: 82  Resp: 16  Temp: (!) 97.3 F (36.3 C)  SpO2: 97%  Weight: 115 lb 9.6 oz (52.4 kg)  Height: 4\' 10"  (1.473 m)   Body mass index is 24.16 kg/m. Physical Exam Vitals reviewed.  Constitutional:      General: She is not in acute distress.    Appearance:  Normal appearance. She is normal weight. She is not ill-appearing or diaphoretic.  HENT:     Head: Normocephalic.     Nose: Nose normal. No congestion or rhinorrhea.     Mouth/Throat:     Mouth: Mucous membranes are moist.     Pharynx: Oropharynx is clear. No oropharyngeal exudate or posterior oropharyngeal erythema.  Eyes:     General: No scleral icterus.       Right eye: No discharge.        Left eye: No discharge.     Conjunctiva/sclera: Conjunctivae normal.     Pupils: Pupils are equal, round, and reactive to light.  Neck:     Vascular: No carotid bruit.  Cardiovascular:     Rate and Rhythm: Normal rate and regular rhythm.     Pulses: Normal pulses.     Heart sounds: Normal heart sounds. No murmur heard.   No friction rub. No gallop.  Pulmonary:     Effort: Pulmonary effort is normal. No respiratory distress.     Breath sounds: Normal breath sounds. No wheezing, rhonchi or rales.  Chest:     Chest wall: No tenderness.  Abdominal:     General: Bowel sounds are normal. There is no distension.     Palpations: Abdomen is soft. There is no mass.     Tenderness: There is no abdominal tenderness. There is no right CVA tenderness, left CVA tenderness, guarding or rebound.  Musculoskeletal:        General: No swelling or tenderness.     Right shoulder: No swelling, effusion, tenderness or crepitus. Decreased range of motion. Normal strength. Normal pulse.     Left shoulder: Normal.     Cervical back: Normal range of motion. Torticollis present. No rigidity, tenderness or crepitus.     Comments: Chronic bilateral lower extremities edema.  Lymphadenopathy:     Cervical: No cervical adenopathy.  Skin:    General: Skin is warm and dry.     Coloration: Skin is not pale.     Findings: No bruising, erythema, lesion or rash.     Comments: Previous sacral area pressure ulcer site has resolved remains high risk due to her poor oral intake and weight loss.   Neurological:     Mental  Status: She is alert and oriented to person, place, and time.     Motor: No weakness.     Gait: Gait abnormal.  Psychiatric:        Mood and Affect: Mood normal.        Speech: Speech normal.        Behavior: Behavior normal.  Thought Content: Thought content normal.        Judgment: Judgment normal.    Labs reviewed: Recent Labs    12/07/20 1526 04/02/21 1433 06/10/21 1128  NA 140 141 137  K 4.1 4.0 3.6  CL 103 104 104  CO2 33* 29 28  GLUCOSE 95 104 86  BUN 21 25 21   CREATININE 1.06 1.10* 0.76  CALCIUM 9.7 9.2 8.1*   Recent Labs    10/09/20 1339 12/07/20 1526  AST 25 27  ALT 14 18  ALKPHOS  --  33*  BILITOT 0.8 0.8  PROT 6.7 7.0  ALBUMIN  --  4.3   Recent Labs    10/09/20 1339 12/07/20 1526 06/10/21 1128  WBC 5.0 5.3 4.5  NEUTROABS 2,670 2.9 2.6  HGB 13.0 13.7 11.9*  HCT 39.6 42.1 37.7  MCV 93.4 94.0 98.7  PLT 221 168.0 174   Lab Results  Component Value Date   TSH 3.14 10/09/2020   No results found for: HGBA1C Lab Results  Component Value Date   CHOL 199 02/02/2014   HDL 98.70 02/02/2014   LDLCALC 96 02/02/2014   TRIG 20.0 02/02/2014   CHOLHDL 2 02/02/2014    Significant Diagnostic Results in last 30 days:  No results found.  Assessment/Plan 1. Need for influenza vaccination .afebrile. Flut shot administered by CMA no acute reaction reported.  - Flu Vaccine QUAD High Dose(Fluad)  2. Venous (peripheral) insufficiency Chronic edema. - Ambulatory referral to Social Work  3. Chronic diastolic congestive heart failure (HCC) No signs of fluid overload. -advised to continue on Furosemide 20 mg tablet every other day but argues with son and provider during visit thinks she should take Furosemide daily  - Ambulatory referral to Social Work  4. Weight loss Has gained 19 lbs over months which is beneficial for her.  Continue to encourage oral intake. - continue on Ensure  - Ambulatory referral to Social Work  5. Unsteady gait High  risk for falls  Requires assistance with ADL's.  - Ambulatory referral to Social Work  Family/ staff Communication: Reviewed plan of care with patient and son verbalized understanding.   Labs/tests ordered: None   Next Appointment: As needed if symptoms worsen or fail to improve    Sandrea Hughs, NP

## 2021-08-19 NOTE — Patient Instructions (Signed)
-   continue on Ensure supplement

## 2021-08-20 ENCOUNTER — Telehealth: Payer: Self-pay | Admitting: Family

## 2021-08-20 DIAGNOSIS — J302 Other seasonal allergic rhinitis: Secondary | ICD-10-CM | POA: Diagnosis not present

## 2021-08-20 DIAGNOSIS — M47812 Spondylosis without myelopathy or radiculopathy, cervical region: Secondary | ICD-10-CM | POA: Diagnosis not present

## 2021-08-20 DIAGNOSIS — I110 Hypertensive heart disease with heart failure: Secondary | ICD-10-CM | POA: Diagnosis not present

## 2021-08-20 DIAGNOSIS — G894 Chronic pain syndrome: Secondary | ICD-10-CM | POA: Diagnosis not present

## 2021-08-20 DIAGNOSIS — F02818 Dementia in other diseases classified elsewhere, unspecified severity, with other behavioral disturbance: Secondary | ICD-10-CM | POA: Diagnosis not present

## 2021-08-20 DIAGNOSIS — K5901 Slow transit constipation: Secondary | ICD-10-CM | POA: Diagnosis not present

## 2021-08-20 DIAGNOSIS — I5032 Chronic diastolic (congestive) heart failure: Secondary | ICD-10-CM | POA: Diagnosis not present

## 2021-08-20 DIAGNOSIS — F411 Generalized anxiety disorder: Secondary | ICD-10-CM | POA: Diagnosis not present

## 2021-08-20 DIAGNOSIS — M810 Age-related osteoporosis without current pathological fracture: Secondary | ICD-10-CM | POA: Diagnosis not present

## 2021-08-20 DIAGNOSIS — K219 Gastro-esophageal reflux disease without esophagitis: Secondary | ICD-10-CM | POA: Diagnosis not present

## 2021-08-20 DIAGNOSIS — K5790 Diverticulosis of intestine, part unspecified, without perforation or abscess without bleeding: Secondary | ICD-10-CM | POA: Diagnosis not present

## 2021-08-20 DIAGNOSIS — M150 Primary generalized (osteo)arthritis: Secondary | ICD-10-CM | POA: Diagnosis not present

## 2021-08-20 DIAGNOSIS — K449 Diaphragmatic hernia without obstruction or gangrene: Secondary | ICD-10-CM | POA: Diagnosis not present

## 2021-08-20 DIAGNOSIS — K2950 Unspecified chronic gastritis without bleeding: Secondary | ICD-10-CM | POA: Diagnosis not present

## 2021-08-20 DIAGNOSIS — I4510 Unspecified right bundle-branch block: Secondary | ICD-10-CM | POA: Diagnosis not present

## 2021-08-20 DIAGNOSIS — G301 Alzheimer's disease with late onset: Secondary | ICD-10-CM | POA: Diagnosis not present

## 2021-08-20 NOTE — Telephone Encounter (Signed)
Spoke with pts case mgr at [facility], Verdene Lennert [phone].  Verdene Lennert has weekly contact with Ms Dona & her son [name]. I have shared the update regarding social worker/aid needed to assist in the home for ADL's.  HH is no longer an option for pt bc there's no goals to be met at this time, which keeps personal care services thru [facility] care from providing services.  Case mgr(Vernoica) has spoke with son(Michael) & would like to see if options for pallative care is possible?  Please advise & addend the referral to accommodate. Thanks, Vilinda Blanks

## 2021-08-20 NOTE — Telephone Encounter (Signed)
Okay.Will add referral to palliative care if it's okay with son.

## 2021-08-28 ENCOUNTER — Telehealth: Payer: Self-pay | Admitting: Family

## 2021-08-28 NOTE — Telephone Encounter (Signed)
Palliative care ordered.

## 2021-08-28 NOTE — Addendum Note (Signed)
Addended byMarlowe Sax C on: 08/28/2021 10:11 AM   Modules accepted: Orders

## 2021-08-28 NOTE — Telephone Encounter (Signed)
Received call from [facility] rep stating that he had spoke with Ms Heather [name] son(Heather [name]) with options of [place].  He is on board with having pt get hospice care, & she is scheduled for RN to evaluate her on Fri 08/30/21, but they need for referral to state Hospice instead of Pallative.  All diagnosis are covered Heather [name]

## 2021-08-28 NOTE — Telephone Encounter (Signed)
Waiting for updated referral for pallative in order to send & start pt care in home  Thanks, Lattie Haw

## 2021-08-30 NOTE — Addendum Note (Signed)
Addended byMarlowe Sax C on: 08/30/2021 12:23 PM   Modules accepted: Orders

## 2021-09-24 ENCOUNTER — Ambulatory Visit: Payer: Medicare Other | Admitting: Family

## 2021-10-03 ENCOUNTER — Ambulatory Visit: Payer: Medicare Other | Admitting: Family

## 2021-10-22 ENCOUNTER — Telehealth: Payer: Self-pay | Admitting: *Deleted

## 2021-10-22 ENCOUNTER — Ambulatory Visit: Payer: Medicare Other | Admitting: Family

## 2021-10-22 NOTE — Telephone Encounter (Signed)
Recommend follow up appointment to recheck kidney function prior to changing Furosemide to daily.

## 2021-10-22 NOTE — Telephone Encounter (Signed)
Patient called and canceled her appointment. Stated that she did not have a ride.   Patient is requesting refill on her Furosemide. Stated that she takes it One tablet daily NOT 1/2 every other day.   Patient is requesting it to be changed and sent to pharmacy for One tablet daily.  I explained the importance to patient to follow up with appointment. She stated that she will call back to reschedule once she has a ride.   Is it ok to change directions of the Furosemide to One tablet daily and send to pharmacy.   Please Advise.

## 2021-10-23 NOTE — Telephone Encounter (Signed)
Tried calling patient, cannot leave message.

## 2021-10-24 NOTE — Telephone Encounter (Signed)
Spoke with Patient and she scheduled an appointment for 10/25/2021 @ 2:45 with Heather [name].

## 2021-10-25 ENCOUNTER — Other Ambulatory Visit: Payer: Self-pay

## 2021-10-25 ENCOUNTER — Ambulatory Visit (INDEPENDENT_AMBULATORY_CARE_PROVIDER_SITE_OTHER): Payer: Medicare Other | Admitting: Adult Health

## 2021-10-25 ENCOUNTER — Encounter: Payer: Self-pay | Admitting: Adult Health

## 2021-10-25 VITALS — BP 120/70 | HR 70 | Temp 97.3°F | Ht <= 58 in

## 2021-10-25 DIAGNOSIS — I872 Venous insufficiency (chronic) (peripheral): Secondary | ICD-10-CM | POA: Diagnosis not present

## 2021-10-25 DIAGNOSIS — I5032 Chronic diastolic (congestive) heart failure: Secondary | ICD-10-CM | POA: Diagnosis not present

## 2021-10-25 DIAGNOSIS — R600 Localized edema: Secondary | ICD-10-CM

## 2021-10-25 MED ORDER — FUROSEMIDE 20 MG PO TABS: 10.0000 mg | ORAL_TABLET | Freq: Every day | ORAL | 0 refills | Status: DC

## 2021-10-25 NOTE — Patient Instructions (Signed)
Edema ?Edema is when you have too much fluid in your body or under your skin. Edema may make your legs, feet, and ankles swell. Swelling often happens in looser tissues, such as around your eyes. This is a common condition. It gets more common as you get older. ?There are many possible causes of edema. These include: ?Eating too much salt (sodium). ?Being on your feet or sitting for a long time. ?Certain medical conditions, such as: ?Pregnancy. ?Heart failure. ?Liver disease. ?Kidney disease. ?Cancer. ?Hot weather may make edema worse. Edema is usually painless. Your skin may look swollen or shiny. ?Follow these instructions at home: ?Medicines ?Take over-the-counter and prescription medicines only as told by your doctor. ?Your doctor may prescribe a medicine to help your body get rid of extra water (diuretic). Take this medicine if you are told to take it. ?Eating and drinking ?Eat a low-salt (low-sodium) diet as told by your doctor. Sometimes, eating less salt may reduce swelling. ?Depending on the cause of your swelling, you may need to limit how much fluid you drink (fluid restriction). ?General instructions ?Raise the injured area above the level of your heart while you are sitting or lying down. ?Do not sit still or stand for a long time. ?Do not wear tight clothes. Do not wear garters on your upper legs. ?Exercise your legs. This can help the swelling go down. ?Wear compression stockings as told by your doctor. It is important that these are the right size. These should be prescribed by your doctor to prevent possible injuries. ?If elastic bandages or wraps are recommended, use them as told by your doctor. ?Contact a doctor if: ?Treatment is not working. ?You have heart, liver, or kidney disease and have symptoms of edema. ?You have sudden and unexplained weight gain. ?Get help right away if: ?You have shortness of breath or chest pain. ?You cannot breathe when you lie down. ?You have pain, redness, or warmth  in the swollen areas. ?You have heart, liver, or kidney disease and get edema all of a sudden. ?You have a fever and your symptoms get worse all of a sudden. ?These symptoms may be an emergency. Get help right away. Call 911. ?Do not wait to see if the symptoms will go away. ?Do not drive yourself to the hospital. ?Summary ?Edema is when you have too much fluid in your body or under your skin. ?Edema may make your legs, feet, and ankles swell. Swelling often happens in looser tissues, such as around your eyes. ?Raise the injured area above the level of your heart while you are sitting or lying down. ?Follow your doctor's instructions about diet and how much fluid you can drink. ?This information is not intended to replace advice given to you by your health care provider. Make sure you discuss any questions you have with your health care provider. ?Document Revised: 07/08/2021 Document Reviewed: 07/08/2021 ?Elsevier Patient Education ? 2022 [employer] ? ?

## 2021-10-25 NOTE — Progress Notes (Signed)
[facility] clinic  Provider:  Durenda Age  DNP  Code Status:  Full Code  Goals of Care:  Advanced Directives 08/19/2021  Does Patient Have a Medical Advance Directive? Yes  Type of Advance Directive Living will  Does patient want to make changes to medical advance directive? No - Patient declined  Copy of [place] in Chart? -  Would patient like information on creating a medical advance directive? -     Chief Complaint  Patient presents with   Acute Visit    Legs and hand swelling.     HPI: Patient is a 87 y.o. female seen today for an acute visit for bilateral lower extremity edema. She was brought to [facility] by a friend who stayed outside while she was brought in the clinic. She is alert to self and place, disoriented to time. She stated that the year is 2021. She was complaining today of bilateral lower extremity 2+edema and left hand with 1+ edema. She has 2+ radial pulse. She denies pain. She was wearing bilateral lower legs stockings. . She is currently taking Aldactone 25 mg daily and Furosemide 10 mg every other day for diastolic CHF and edema. She has a medical history significant for  hypertension, breast cancer, venous peripheral insufficiency and dementia.  Past Medical History:  Diagnosis Date   Abdominal pain, left lower quadrant 06/22/2013   Abnormal CT scan of lung 07/16/2015   Allergic rhinitis, cause unspecified 02/21/2014   Anxiety state, unspecified    Breast cancer ([place])    NO STICK OR BLOOD PRESSURE CHECKS IN LEFT ARM   Carpal tunnel syndrome    Carpal tunnel syndrome on both sides 10/03/2008   Qualifier: Diagnosis of  By: Lenna Gilford MD, Deborra Medina    Cervical spondylosis    Chronic pain syndrome 03/03/2019   Colon polyp 12/19/2009   Transverse-polypoid colorectal mucosa   Constipation    Cough 10/21/2014   Degenerative joint disease    Diastolic dysfunction    Diverticula of colon    Diverticulosis of colon 05/24/2002   Qualifier: Diagnosis of   By: Jerral Ralph     Gastritis, chronic    Hiatal hernia    Hypertension    Low back pain    LPRD (laryngopharyngeal reflux disease) 04/04/2014   Osteoporosis 05/22/2020   RBBB (right bundle branch block with left anterior fascicular block)    Renal cyst    Venous insufficiency     Past Surgical History:  Procedure Laterality Date   CARPAL TUNNEL RELEASE     Left mastectomy      Allergies  Allergen Reactions   Fentanyl Nausea Only and Other (See Comments)    Pt states that she is not allergic     Naprosyn [Naproxen] Other (See Comments)    Patient felt tongue and face swelling. Went to ED where no visible swelling noted    Outpatient Encounter Medications as of 10/25/2021  Medication Sig   acetaminophen (TYLENOL) 500 MG tablet Take 500 mg by mouth every 6 (six) hours as needed for moderate pain or headache.   ALPRAZolam (XANAX) 0.25 MG tablet Take 1 tablet (0.25 mg total) by mouth 2 (two) times daily as needed. for anxiety   carvedilol (COREG) 3.125 MG tablet Take 1 tablet by mouth twice daily   cholecalciferol (VITAMIN D) 1000 units tablet Take 1,000 Units by mouth daily.   famotidine (PEPCID) 20 MG tablet Take 1 tablet (20 mg total) by mouth daily.   feeding  supplement, ENSURE ENLIVE, (ENSURE ENLIVE) LIQD Take 237 mLs by mouth 2 (two) times daily between meals.   furosemide (LASIX) 20 MG tablet Take 0.5 tablets (10 mg total) by mouth daily. TAKE 1/2 (ONE-HALF) TABLET BY MOUTH EVERY DAY   levocetirizine (XYZAL) 5 MG tablet Take 1 tablet (5 mg total) by mouth daily.   lidocaine (LIDODERM) 5 % Place 1 patch onto the skin as needed. Remove & Discard patch within 12 hours or as directed by MD   Multiple Vitamins-Minerals (MULTIVITAMINS THER. W/MINERALS) TABS Take 1 tablet by mouth daily.   polyethylene glycol (MIRALAX / GLYCOLAX) 17 g packet Take 17 g by mouth 2 (two) times daily.   Propylene Glycol (SYSTANE COMPLETE OP) Apply 1 drop to eye daily as needed (dry eyes).    sennosides-docusate sodium (SENOKOT-S) 8.6-50 MG tablet Take 1-2 tablets by mouth daily.   spironolactone (ALDACTONE) 25 MG tablet Take 1 tablet (25 mg total) by mouth daily.   zinc oxide (BALMEX) 11.3 % CREA cream Apply 1 application topically 2 (two) times daily as needed.   [DISCONTINUED] furosemide (LASIX) 20 MG tablet TAKE 1/2 (ONE-HALF) TABLET BY MOUTH EVERY OTHER DAY   No facility-administered encounter medications on file as of 10/25/2021.    Review of Systems:  Review of Systems  Constitutional:  Negative for fatigue and fever.  Respiratory:  Negative for cough and shortness of breath.   Cardiovascular:  Positive for leg swelling.  Gastrointestinal:  Negative for abdominal pain and vomiting.  Genitourinary:  Negative for difficulty urinating and dysuria.  Neurological:  Negative for dizziness.   Health Maintenance  Topic Date Due   TETANUS/TDAP  Never done   Zoster Vaccines- Shingrix (1 of 2) Never done   Pneumonia Vaccine 61+ Years old (2 - PCV) 02/07/2016   COVID-19 Vaccine (3 - Pfizer risk series) 06/07/2020   INFLUENZA VACCINE  Completed   DEXA SCAN  Completed   HPV VACCINES  Aged Out    Physical Exam: Vitals:   10/25/21 1622  BP: 120/70  Pulse: 70  Temp: (!) 97.3 F (36.3 C)  SpO2: 95%  Height: $Remove'4\' 10"'bbVaGIZ$  (1.473 m)   Body mass index is 24.16 kg/m. Physical Exam Constitutional:      Appearance: She is normal weight.  Cardiovascular:     Rate and Rhythm: Normal rate and regular rhythm.     Heart sounds: Normal heart sounds.  Pulmonary:     Effort: Pulmonary effort is normal.     Breath sounds: Normal breath sounds.  Abdominal:     General: Bowel sounds are normal.     Palpations: Abdomen is soft.  Musculoskeletal:        General: Normal range of motion.     Right lower leg: Edema present.     Left lower leg: Edema present.     Comments: BLE 2+edema  Skin:    General: Skin is warm and dry.  Neurological:     Mental Status: She is alert. Mental status  is at baseline.     Comments: Alert to self and place, disoriented to time.  Psychiatric:        Mood and Affect: Mood normal.    Labs reviewed: Basic Metabolic Panel: Recent Labs    12/07/20 1526 04/02/21 1433 06/10/21 1128  NA 140 141 137  K 4.1 4.0 3.6  CL 103 104 104  CO2 33* 29 28  GLUCOSE 95 104 86  BUN $Re'21 25 21  'SaB$ CREATININE 1.06 1.10* 0.76  CALCIUM  9.7 9.2 8.1*   Liver Function Tests: Recent Labs    12/07/20 1526  AST 27  ALT 18  ALKPHOS 33*  BILITOT 0.8  PROT 7.0  ALBUMIN 4.3   No results for input(s): LIPASE, AMYLASE in the last 8760 hours. No results for input(s): AMMONIA in the last 8760 hours. CBC: Recent Labs    12/07/20 1526 06/10/21 1128  WBC 5.3 4.5  NEUTROABS 2.9 2.6  HGB 13.7 11.9*  HCT 42.1 37.7  MCV 94.0 98.7  PLT 168.0 174   Lipid Panel: No results for input(s): CHOL, HDL, LDLCALC, TRIG, CHOLHDL, LDLDIRECT in the last 8760 hours. No results found for: HGBA1C  Procedures since last visit: No results found.  Assessment/Plan  1. Bilateral lower extremity edema -  will increase Lasix 10 mg every other day to daily and to continue Aldactone 25 mg daily - furosemide (LASIX) 20 MG tablet; Take 0.5 tablets (10 mg total) by mouth daily. TAKE 1/2 (ONE-HALF) TABLET BY MOUTH EVERY DAY  Dispense: 15 tablet; Refill: 0 - BMP8+eGFR; Future -  [place] staff called son and discussed about medication change and labs which needs to be done on Monday, 10/28/21  2. Venous (peripheral) insufficiency -  instructed to elevate BLE nad left hand when lying down -  continue bilateral compression stockings  3. Chronic diastolic congestive heart failure (HCC) -  no SOB -  continue Aldactone and Furosemide     Labs/tests ordered: BMP with GFR on 10/28/21, son was informed via telephone  Next appt:  will need to have labs done on 10/28/21

## 2021-10-28 ENCOUNTER — Other Ambulatory Visit

## 2021-10-28 ENCOUNTER — Other Ambulatory Visit: Payer: Self-pay

## 2021-10-28 ENCOUNTER — Other Ambulatory Visit: Payer: Self-pay | Admitting: Adult Health

## 2021-10-28 DIAGNOSIS — R600 Localized edema: Secondary | ICD-10-CM

## 2021-10-28 LAB — BASIC METABOLIC PANEL WITH GFR
BUN/Creatinine Ratio: 20 (calc) (ref 6–22)
BUN: 20 mg/dL (ref 7–25)
CO2: 30 mmol/L (ref 20–32)
Calcium: 9.4 mg/dL (ref 8.6–10.4)
Chloride: 102 mmol/L (ref 98–110)
Creat: 1.02 mg/dL — ABNORMAL HIGH (ref 0.60–0.95)
Glucose, Bld: 120 mg/dL (ref 65–139)
Potassium: 4 mmol/L (ref 3.5–5.3)
Sodium: 141 mmol/L (ref 135–146)
eGFR: 49 mL/min/{1.73_m2} — ABNORMAL LOW (ref >=60)

## 2021-10-30 ENCOUNTER — Other Ambulatory Visit: Payer: Self-pay | Admitting: Adult Health

## 2021-10-30 DIAGNOSIS — R600 Localized edema: Secondary | ICD-10-CM

## 2021-10-30 MED ORDER — FUROSEMIDE 20 MG PO TABS: 10.0000 mg | ORAL_TABLET | ORAL | 0 refills | Status: DC

## 2021-10-30 NOTE — Progress Notes (Signed)
BMP result showed creatinine up from 0.76 to 1.02 and GFR down from >60 to 49. She has chronic kidney disease stage IIIa. She will need to go back to Lasix 20 mg take 1/2 tab = 10 mg by mouth every other day. Hopefully her edema has gone down. If edema starts getting worse then will need to be seen in the office/PSC.

## 2021-11-11 ENCOUNTER — Encounter (HOSPITAL_COMMUNITY): Payer: Self-pay

## 2021-11-11 ENCOUNTER — Emergency Department (HOSPITAL_COMMUNITY): Payer: Medicare Other

## 2021-11-11 ENCOUNTER — Inpatient Hospital Stay (HOSPITAL_COMMUNITY)
Admission: EM | Admit: 2021-11-11 | Discharge: 2021-11-26 | DRG: 480 | Disposition: A | Discharge status: Skilled Nursing Facility | Payer: Medicare Other | Attending: Family Medicine | Admitting: Family Medicine

## 2021-11-11 ENCOUNTER — Inpatient Hospital Stay (HOSPITAL_COMMUNITY): Payer: Medicare Other

## 2021-11-11 ENCOUNTER — Other Ambulatory Visit: Payer: Self-pay

## 2021-11-11 DIAGNOSIS — D62 Acute posthemorrhagic anemia: Secondary | ICD-10-CM | POA: Diagnosis not present

## 2021-11-11 DIAGNOSIS — Z885 Allergy status to narcotic agent status: Secondary | ICD-10-CM

## 2021-11-11 DIAGNOSIS — I10 Essential (primary) hypertension: Secondary | ICD-10-CM | POA: Diagnosis not present

## 2021-11-11 DIAGNOSIS — G9341 Metabolic encephalopathy: Secondary | ICD-10-CM

## 2021-11-11 DIAGNOSIS — I110 Hypertensive heart disease with heart failure: Secondary | ICD-10-CM | POA: Diagnosis present

## 2021-11-11 DIAGNOSIS — Z419 Encounter for procedure for purposes other than remedying health state, unspecified: Secondary | ICD-10-CM

## 2021-11-11 DIAGNOSIS — S72141A Displaced intertrochanteric fracture of right femur, initial encounter for closed fracture: Secondary | ICD-10-CM | POA: Diagnosis present

## 2021-11-11 DIAGNOSIS — J302 Other seasonal allergic rhinitis: Secondary | ICD-10-CM | POA: Diagnosis not present

## 2021-11-11 DIAGNOSIS — K5900 Constipation, unspecified: Secondary | ICD-10-CM | POA: Diagnosis present

## 2021-11-11 DIAGNOSIS — I872 Venous insufficiency (chronic) (peripheral): Secondary | ICD-10-CM | POA: Diagnosis present

## 2021-11-11 DIAGNOSIS — L89153 Pressure ulcer of sacral region, stage 3: Secondary | ICD-10-CM | POA: Diagnosis not present

## 2021-11-11 DIAGNOSIS — W010XXA Fall on same level from slipping, tripping and stumbling without subsequent striking against object, initial encounter: Secondary | ICD-10-CM | POA: Diagnosis present

## 2021-11-11 DIAGNOSIS — S72001D Fracture of unspecified part of neck of right femur, subsequent encounter for closed fracture with routine healing: Secondary | ICD-10-CM | POA: Diagnosis not present

## 2021-11-11 DIAGNOSIS — U071 COVID-19: Secondary | ICD-10-CM

## 2021-11-11 DIAGNOSIS — F411 Generalized anxiety disorder: Secondary | ICD-10-CM | POA: Diagnosis present

## 2021-11-11 DIAGNOSIS — R451 Restlessness and agitation: Secondary | ICD-10-CM | POA: Diagnosis not present

## 2021-11-11 DIAGNOSIS — M159 Polyosteoarthritis, unspecified: Secondary | ICD-10-CM | POA: Diagnosis present

## 2021-11-11 DIAGNOSIS — M6281 Muscle weakness (generalized): Secondary | ICD-10-CM | POA: Diagnosis not present

## 2021-11-11 DIAGNOSIS — I5032 Chronic diastolic (congestive) heart failure: Secondary | ICD-10-CM | POA: Diagnosis present

## 2021-11-11 DIAGNOSIS — M79604 Pain in right leg: Secondary | ICD-10-CM | POA: Diagnosis not present

## 2021-11-11 DIAGNOSIS — J188 Other pneumonia, unspecified organism: Secondary | ICD-10-CM | POA: Diagnosis not present

## 2021-11-11 DIAGNOSIS — Z853 Personal history of malignant neoplasm of breast: Secondary | ICD-10-CM | POA: Diagnosis not present

## 2021-11-11 DIAGNOSIS — G3184 Mild cognitive impairment, so stated: Secondary | ICD-10-CM | POA: Diagnosis present

## 2021-11-11 DIAGNOSIS — K5730 Diverticulosis of large intestine without perforation or abscess without bleeding: Secondary | ICD-10-CM | POA: Diagnosis present

## 2021-11-11 DIAGNOSIS — R531 Weakness: Secondary | ICD-10-CM | POA: Diagnosis not present

## 2021-11-11 DIAGNOSIS — I4510 Unspecified right bundle-branch block: Secondary | ICD-10-CM | POA: Diagnosis not present

## 2021-11-11 DIAGNOSIS — M810 Age-related osteoporosis without current pathological fracture: Secondary | ICD-10-CM | POA: Diagnosis present

## 2021-11-11 DIAGNOSIS — E559 Vitamin D deficiency, unspecified: Secondary | ICD-10-CM | POA: Diagnosis not present

## 2021-11-11 DIAGNOSIS — Z9012 Acquired absence of left breast and nipple: Secondary | ICD-10-CM

## 2021-11-11 DIAGNOSIS — K219 Gastro-esophageal reflux disease without esophagitis: Secondary | ICD-10-CM | POA: Diagnosis present

## 2021-11-11 DIAGNOSIS — N390 Urinary tract infection, site not specified: Secondary | ICD-10-CM | POA: Diagnosis not present

## 2021-11-11 DIAGNOSIS — Z79899 Other long term (current) drug therapy: Secondary | ICD-10-CM

## 2021-11-11 DIAGNOSIS — R3981 Functional urinary incontinence: Secondary | ICD-10-CM | POA: Diagnosis not present

## 2021-11-11 DIAGNOSIS — Z9181 History of falling: Secondary | ICD-10-CM

## 2021-11-11 DIAGNOSIS — E46 Unspecified protein-calorie malnutrition: Secondary | ICD-10-CM | POA: Diagnosis not present

## 2021-11-11 DIAGNOSIS — S72001A Fracture of unspecified part of neck of right femur, initial encounter for closed fracture: Secondary | ICD-10-CM | POA: Diagnosis not present

## 2021-11-11 DIAGNOSIS — L89156 Pressure-induced deep tissue damage of sacral region: Secondary | ICD-10-CM | POA: Diagnosis not present

## 2021-11-11 DIAGNOSIS — Z7401 Bed confinement status: Secondary | ICD-10-CM | POA: Diagnosis not present

## 2021-11-11 DIAGNOSIS — Z8616 Personal history of COVID-19: Secondary | ICD-10-CM | POA: Diagnosis not present

## 2021-11-11 DIAGNOSIS — L89626 Pressure-induced deep tissue damage of left heel: Secondary | ICD-10-CM | POA: Diagnosis present

## 2021-11-11 DIAGNOSIS — Y92002 Bathroom of unspecified non-institutional (private) residence single-family (private) house as the place of occurrence of the external cause: Secondary | ICD-10-CM | POA: Diagnosis not present

## 2021-11-11 DIAGNOSIS — Z1159 Encounter for screening for other viral diseases: Secondary | ICD-10-CM | POA: Diagnosis not present

## 2021-11-11 DIAGNOSIS — L8990 Pressure ulcer of unspecified site, unspecified stage: Secondary | ICD-10-CM | POA: Diagnosis present

## 2021-11-11 DIAGNOSIS — E785 Hyperlipidemia, unspecified: Secondary | ICD-10-CM | POA: Diagnosis present

## 2021-11-11 DIAGNOSIS — R600 Localized edema: Secondary | ICD-10-CM

## 2021-11-11 DIAGNOSIS — Z888 Allergy status to other drugs, medicaments and biological substances status: Secondary | ICD-10-CM

## 2021-11-11 DIAGNOSIS — I951 Orthostatic hypotension: Secondary | ICD-10-CM | POA: Diagnosis not present

## 2021-11-11 DIAGNOSIS — G894 Chronic pain syndrome: Secondary | ICD-10-CM | POA: Diagnosis present

## 2021-11-11 DIAGNOSIS — T07XXXA Unspecified multiple injuries, initial encounter: Secondary | ICD-10-CM | POA: Diagnosis not present

## 2021-11-11 DIAGNOSIS — S72141D Displaced intertrochanteric fracture of right femur, subsequent encounter for closed fracture with routine healing: Secondary | ICD-10-CM | POA: Diagnosis not present

## 2021-11-11 LAB — CBC WITH DIFFERENTIAL/PLATELET
Abs Immature Granulocytes: 0.04 10*3/uL (ref 0.00–0.07)
Basophils Absolute: 0 10*3/uL (ref 0.0–0.1)
Basophils Relative: 0 %
Eosinophils Absolute: 0 10*3/uL (ref 0.0–0.5)
Eosinophils Relative: 0 %
HCT: 40 % (ref 36.0–46.0)
Hemoglobin: 12.7 g/dL (ref 12.0–15.0)
Immature Granulocytes: 0 %
Lymphocytes Relative: 12 %
Lymphs Abs: 1.2 10*3/uL (ref 0.7–4.0)
MCH: 30.9 pg (ref 26.0–34.0)
MCHC: 31.8 g/dL (ref 30.0–36.0)
MCV: 97.3 fL (ref 80.0–100.0)
Monocytes Absolute: 1.2 10*3/uL — ABNORMAL HIGH (ref 0.1–1.0)
Monocytes Relative: 12 %
Neutro Abs: 7.4 10*3/uL (ref 1.7–7.7)
Neutrophils Relative %: 76 %
Platelets: 185 10*3/uL (ref 150–400)
RBC: 4.11 MIL/uL (ref 3.87–5.11)
RDW: 13.6 % (ref 11.5–15.5)
WBC: 9.9 10*3/uL (ref 4.0–10.5)
nRBC: 0 % (ref 0.0–0.2)

## 2021-11-11 LAB — HEPATIC FUNCTION PANEL
ALT: 28 U/L (ref 0–44)
AST: 48 U/L — ABNORMAL HIGH (ref 15–41)
Albumin: 3.7 g/dL (ref 3.5–5.0)
Alkaline Phosphatase: 40 U/L (ref 38–126)
Bilirubin, Direct: 0.3 mg/dL — ABNORMAL HIGH (ref 0.0–0.2)
Indirect Bilirubin: 1 mg/dL — ABNORMAL HIGH (ref 0.3–0.9)
Total Bilirubin: 1.3 mg/dL — ABNORMAL HIGH (ref 0.3–1.2)
Total Protein: 6.7 g/dL (ref 6.5–8.1)

## 2021-11-11 LAB — BASIC METABOLIC PANEL
Anion gap: 7 (ref 5–15)
BUN: 18 mg/dL (ref 8–23)
CO2: 30 mmol/L (ref 22–32)
Calcium: 8.9 mg/dL (ref 8.9–10.3)
Chloride: 109 mmol/L (ref 98–111)
Creatinine, Ser: 0.94 mg/dL (ref 0.44–1.00)
GFR, Estimated: 54 mL/min — ABNORMAL LOW (ref >60)
Glucose, Bld: 124 mg/dL — ABNORMAL HIGH (ref 70–99)
Potassium: 4.8 mmol/L (ref 3.5–5.1)
Sodium: 146 mmol/L — ABNORMAL HIGH (ref 135–145)

## 2021-11-11 LAB — TYPE AND SCREEN
ABO/RH(D): A POS
Antibody Screen: NEGATIVE

## 2021-11-11 MED ORDER — MORPHINE SULFATE (PF) 2 MG/ML IV SOLN: 2.0000 mg | Freq: Once | INTRAVENOUS | Status: DC

## 2021-11-11 MED ORDER — ALPRAZOLAM 0.25 MG PO TABS: 0.2500 mg | ORAL_TABLET | Freq: Two times a day (BID) | ORAL | Status: DC | PRN

## 2021-11-11 MED ORDER — HALOPERIDOL LACTATE 5 MG/ML IJ SOLN
2.0000 mg | Freq: Once | INTRAMUSCULAR | Status: AC
  Administered 2021-11-11: 20:00:00 2 mg via INTRAMUSCULAR
  Filled 2021-11-11: qty 1

## 2021-11-11 MED ORDER — POLYETHYLENE GLYCOL 3350 17 G PO PACK
17.0000 g | PACK | Freq: Every day | ORAL | Status: DC
  Administered 2021-11-14 – 2021-11-25 (×11): 17 g via ORAL
  Filled 2021-11-11 (×12): qty 1

## 2021-11-11 MED ORDER — POLYVINYL ALCOHOL 1.4 % OP SOLN
1.0000 [drp] | Freq: Every day | OPHTHALMIC | Status: DC | PRN
  Filled 2021-11-11: qty 15

## 2021-11-11 MED ORDER — ONDANSETRON HCL 4 MG PO TABS: 4.0000 mg | ORAL_TABLET | Freq: Four times a day (QID) | ORAL | Status: DC | PRN

## 2021-11-11 MED ORDER — DEXAMETHASONE SODIUM PHOSPHATE 10 MG/ML IJ SOLN
INTRAMUSCULAR | Status: AC
  Filled 2021-11-11: qty 1

## 2021-11-11 MED ORDER — HALOPERIDOL LACTATE 5 MG/ML IJ SOLN
1.0000 mg | Freq: Four times a day (QID) | INTRAMUSCULAR | Status: DC | PRN
  Administered 2021-11-12: 09:00:00 1 mg via INTRAVENOUS
  Filled 2021-11-11: qty 1

## 2021-11-11 MED ORDER — CARVEDILOL 3.125 MG PO TABS
3.1250 mg | ORAL_TABLET | Freq: Two times a day (BID) | ORAL | Status: DC
  Administered 2021-11-13 – 2021-11-21 (×11): 3.125 mg via ORAL
  Filled 2021-11-11 (×22): qty 1

## 2021-11-11 MED ORDER — CEFAZOLIN SODIUM-DEXTROSE 2-4 GM/100ML-% IV SOLN
2.0000 g | INTRAVENOUS | Status: AC
  Administered 2021-11-13: 17:00:00 2 g via INTRAVENOUS
  Filled 2021-11-11: qty 100

## 2021-11-11 MED ORDER — ROCURONIUM BROMIDE 10 MG/ML (PF) SYRINGE
PREFILLED_SYRINGE | INTRAVENOUS | Status: AC
  Filled 2021-11-11: qty 10

## 2021-11-11 MED ORDER — LIDOCAINE HCL (PF) 2 % IJ SOLN
INTRAMUSCULAR | Status: AC
  Filled 2021-11-11: qty 5

## 2021-11-11 MED ORDER — FUROSEMIDE 20 MG PO TABS
10.0000 mg | ORAL_TABLET | ORAL | Status: DC
  Administered 2021-11-16 – 2021-11-18 (×2): 10 mg via ORAL
  Filled 2021-11-11 (×3): qty 1

## 2021-11-11 MED ORDER — RISPERIDONE 0.5 MG PO TBDP
0.5000 mg | ORAL_TABLET | Freq: Every day | ORAL | Status: DC
  Administered 2021-11-12 – 2021-11-25 (×11): 0.5 mg via ORAL
  Filled 2021-11-11 (×16): qty 1

## 2021-11-11 MED ORDER — CHLORHEXIDINE GLUCONATE 4 % EX LIQD
60.0000 mL | Freq: Once | CUTANEOUS | Status: AC
  Administered 2021-11-12: 4 via TOPICAL
  Filled 2021-11-11 (×2): qty 60

## 2021-11-11 MED ORDER — MORPHINE SULFATE (PF) 2 MG/ML IV SOLN
2.0000 mg | INTRAVENOUS | Status: DC | PRN
  Administered 2021-11-11 – 2021-11-14 (×6): 2 mg via INTRAVENOUS
  Filled 2021-11-11 (×6): qty 1

## 2021-11-11 MED ORDER — FENTANYL CITRATE (PF) 100 MCG/2ML IJ SOLN
INTRAMUSCULAR | Status: AC
  Filled 2021-11-11: qty 2

## 2021-11-11 MED ORDER — ONDANSETRON HCL 4 MG/2ML IJ SOLN
INTRAMUSCULAR | Status: AC
  Filled 2021-11-11: qty 2

## 2021-11-11 MED ORDER — FAMOTIDINE 20 MG PO TABS
20.0000 mg | ORAL_TABLET | Freq: Every day | ORAL | Status: DC
  Administered 2021-11-16 – 2021-11-18 (×3): 20 mg via ORAL
  Filled 2021-11-11 (×7): qty 1

## 2021-11-11 MED ORDER — PROPOFOL 10 MG/ML IV BOLUS
INTRAVENOUS | Status: AC
  Filled 2021-11-11: qty 20

## 2021-11-11 MED ORDER — ONDANSETRON HCL 4 MG/2ML IJ SOLN: 4.0000 mg | Freq: Four times a day (QID) | INTRAMUSCULAR | Status: DC | PRN

## 2021-11-11 MED ORDER — SENNOSIDES-DOCUSATE SODIUM 8.6-50 MG PO TABS
1.0000 | ORAL_TABLET | Freq: Two times a day (BID) | ORAL | Status: DC
  Administered 2021-11-16 – 2021-11-19 (×6): 1 via ORAL
  Filled 2021-11-11 (×13): qty 1

## 2021-11-11 MED ORDER — TRANEXAMIC ACID-NACL 1000-0.7 MG/100ML-% IV SOLN
1000.0000 mg | INTRAVENOUS | Status: AC
  Administered 2021-11-13: 17:00:00 1000 mg via INTRAVENOUS
  Filled 2021-11-11: qty 100

## 2021-11-11 NOTE — ED Notes (Signed)
Pt is wet but she refuses to let us change her clothes and bedding.  Every time we try to pull her pants down she lifts them back up.

## 2021-11-11 NOTE — ED Notes (Signed)
Pt voided in bed, changed bedding and gown, pt agitated, pt attempted to scratch this RN, pt now in bed with warm blankets asked if pt would let me place an iv, pt refused.

## 2021-11-11 NOTE — ED Notes (Signed)
Pt in bed, pt oriented, no grimace or c/o pain with palpation of hips, legs arms or shoulders.

## 2021-11-11 NOTE — Progress Notes (Signed)
Pt refusing to take PO meds- Carvedilol, Senokot, Pepcid. Educated without success. Provider made aware.

## 2021-11-11 NOTE — ED Notes (Signed)
Pt confused, agitated. Attempted to explain to pt bed and linen had become wet after voiding, pt not able to comprehend information, continued stating pants were not wet. Staff assisted in changing linen with as little possible disturbance to pt as possible, pt cont with agitation.

## 2021-11-11 NOTE — ED Notes (Signed)
Pt in bed, son at bedside, walked into pt's room and she states, "it is the devil"  attempted to get pt's vital signs pt states "don't touch me" pt refusing vital signs at this time.

## 2021-11-11 NOTE — ED Notes (Signed)
Pt in bed with eyes closed, pt arouses easily to verbal stim, explained I was here to start and iv for her admission to the hospital, pt now very confused and refusing iv placement or blood draw, PA notified

## 2021-11-11 NOTE — ED Notes (Signed)
ED TO INPATIENT HANDOFF REPORT  ED Nurse Name and Phone #: Salvatore Decent Name/Age/Gender Heather [name] 87 y.o. female Room/Bed: WA10/WA10  Code Status   Code Status: Not on file  Home/SNF/Other Home Patient oriented to: self, place, and time Is this baseline? Yes   Triage Complete: Triage complete  Chief Complaint Closed displaced fracture of right femoral neck ([place]) [S72.001A]  Triage Note Pt to er via [place].   Per PTAR pt is here for a fall today. Pt states that she was standing up and lost her balance and fell.  Pt denies hitting her head or passing out.  Pt c/o R hip pain. Pt oriented to person place and time, pt knows she is at [facility] long and it is Monday.    Allergies Allergies  Allergen Reactions   Fentanyl Nausea Only and Other (See Comments)    Pt states that she is not allergic     Naprosyn [Naproxen] Other (See Comments)    Patient felt tongue and face swelling. Went to ED where no visible swelling noted    Level of Care/Admitting Diagnosis ED Disposition     ED Disposition  Admit   Condition  --   Comment  Hospital Area: [facility] [[id number]]  Level of Care: Telemetry [5]  Admit to tele based on following criteria: Monitor for Ischemic changes  May admit patient to [facility] or [facility] if equivalent level of care is available:: No  Covid Evaluation: Asymptomatic Screening Protocol (No Symptoms)  Diagnosis: Closed displaced fracture of right femoral neck [facility]) [[id number]]  Admitting Physician: Reubin Milan [[id number]]  Attending Physician: Reubin Milan [[id number]]  Estimated length of stay: past midnight tomorrow  Certification:: I certify this patient will need inpatient services for at least 2 midnights          B Medical/Surgery History Past Medical History:  Diagnosis Date   Abdominal pain, left lower quadrant 06/22/2013   Abnormal CT scan of lung 07/16/2015   Allergic rhinitis, cause unspecified 02/21/2014    Anxiety state, unspecified    Breast cancer ([place])    NO STICK OR BLOOD PRESSURE CHECKS IN LEFT ARM   Carpal tunnel syndrome    Carpal tunnel syndrome on both sides 10/03/2008   Qualifier: Diagnosis of  By: Lenna Gilford MD, Deborra Medina    Cervical spondylosis    Chronic pain syndrome 03/03/2019   Colon polyp 12/19/2009   Transverse-polypoid colorectal mucosa   Constipation    Cough 10/21/2014   Degenerative joint disease    Diastolic dysfunction    Diverticula of colon    Diverticulosis of colon 05/24/2002   Qualifier: Diagnosis of  By: Jerral Ralph     Gastritis, chronic    Hiatal hernia    Hypertension    Low back pain    LPRD (laryngopharyngeal reflux disease) 04/04/2014   Osteoporosis 05/22/2020   RBBB (right bundle branch block with left anterior fascicular block)    Renal cyst    Venous insufficiency    Past Surgical History:  Procedure Laterality Date   CARPAL TUNNEL RELEASE     Left mastectomy       A IV Location/Drains/Wounds Patient Lines/Drains/Airways Status     Active Line/Drains/Airways     None            Intake/Output Last 24 hours No intake or output data in the 24 hours ending 11/11/21 1504  Labs/Imaging No results found for this or any previous visit (from  the past 48 hour(s)). DG Chest Port 1 View  Result Date: 11/11/2021 CLINICAL DATA:  Fall EXAM: PORTABLE CHEST 1 VIEW COMPARISON:  12/05/2018 FINDINGS: Rotated AP portable examination. Mild cardiomegaly. No acute abnormality of the lungs. No obvious displaced rib fracture or other abnormality. IMPRESSION: Rotated AP portable examination. Mild cardiomegaly. No acute abnormality of the lungs. No obvious displaced rib fracture or other abnormality. Electronically Signed   By: Delanna Ahmadi M.D.   On: 11/11/2021 11:42   DG Hips Bilat W or Wo Pelvis 3-4 Views  Result Date: 11/11/2021 CLINICAL DATA:  Right hip pain after fall EXAM: DG HIP (WITH OR WITHOUT PELVIS) 3-4V BILAT COMPARISON:  10/04/2019  FINDINGS: Acute intertrochanteric fracture of the right femur with mild foreshortening and varus angulation. Lesser trochanteric fragment is slightly displaced. Hip joint intact without dislocation. No left-sided hip fracture. Diffuse osseous demineralization. Degenerative changes of the bilateral hips and sacroiliac joints. IMPRESSION: Acute mildly displaced and angulated intertrochanteric fracture of the right femur. Electronically Signed   By: Davina Poke D.O.   On: 11/11/2021 11:43    Pending Labs Unresulted Labs (From admission, onward)     Start     Ordered   11/11/21 1334  Urinalysis, Routine w reflex microscopic  Once,   STAT        11/11/21 1333   11/11/21 1322  Type and screen Frank  Once,   STAT       Comments: [place]    11/11/21 1321   11/11/21 1321  Resp Panel by RT-PCR (Flu A&B, Covid) Nasopharyngeal Swab  (Tier 2 - Symptomatic/asymptomatic)  Once,   STAT        11/11/21 1321   11/11/21 1321  CBC with Differential  Once,   STAT        11/11/21 1321   11/11/21 1324  Basic metabolic panel  Once,   STAT        11/11/21 1321            Vitals/Pain Today's Vitals   11/11/21 1213 11/11/21 1230 11/11/21 1300 11/11/21 1326  BP: 138/75 130/78 (!) 126/92 131/66  Pulse: 82 81 88 86  Resp: 12 11 20 19   Temp:    97.7 F (36.5 C)  TempSrc:    Oral  SpO2: 100% 99% 93% 99%  Weight:      Height:      PainSc:    Asleep    Isolation Precautions No active isolations  Medications Medications  morphine 2 MG/ML injection 2 mg (2 mg Intravenous Not Given 11/11/21 1402)  chlorhexidine (HIBICLENS) 4 % liquid 4 application (has no administration in time range)  tranexamic acid (CYKLOKAPRON) IVPB 1,000 mg (has no administration in time range)  ceFAZolin (ANCEF) IVPB 2g/100 mL premix (has no administration in time range)    Mobility walks with device Moderate fall risk     R Recommendations: See Admitting Provider  Note  Report given to:   Additional Notes: Upon arrival to the er, pt was oriented and c/o leg pain/fall.  Pt took a nap and when she woke up she has become agitated and confused/combative.  Pt staying in bed and son is at bedside; however, she is refusing iv placement, and surgery at this time.

## 2021-11-11 NOTE — Progress Notes (Signed)
Pt refused labs, pt does not have an IV and refuses turning. Heather [name]

## 2021-11-11 NOTE — Plan of Care (Signed)
  Problem: Activity: Goal: Risk for activity intolerance will decrease Outcome: Progressing   Problem: Safety: Goal: Ability to remain free from injury will improve Outcome: Progressing   

## 2021-11-11 NOTE — Consult Note (Signed)
ORTHOPAEDIC CONSULTATION  REQUESTING PHYSICIAN: Reubin Milan, MD  Chief Complaint: Hip pain  HPI: ADRINNE [name] is a 87 y.o. female who presents with a right intertrochanteric hip fracture after a fall while at her house today.  Of note by the time I had seen the patient she was noncompliant with history and as result the majority of the history was taken by her son [name] as well as an EMT who was at the scene.  Reportedly she went down to tie her shoes and tripped and fell onto the side.  She is a home ambulator with the use of a walker.  She does have a history of congestive heart failure.  Per her son [name] she is typically very lucid and able to make medical decisions although her behavior in the emergency room was not consistent with her baseline.  She currently does not have a power of attorney with her son set up.  Her son denies her being on any blood thinner.  Past Medical History:  Diagnosis Date   Abdominal pain, left lower quadrant 06/22/2013   Abnormal CT scan of lung 07/16/2015   Allergic rhinitis, cause unspecified 02/21/2014   Anxiety state, unspecified    Breast cancer ([place])    NO STICK OR BLOOD PRESSURE CHECKS IN LEFT ARM   Carpal tunnel syndrome    Carpal tunnel syndrome on both sides 10/03/2008   Qualifier: Diagnosis of  By: Lenna Gilford MD, Deborra Medina    Cervical spondylosis    Chronic pain syndrome 03/03/2019   Colon polyp 12/19/2009   Transverse-polypoid colorectal mucosa   Constipation    Cough 10/21/2014   Degenerative joint disease    Diastolic dysfunction    Diverticula of colon    Diverticulosis of colon 05/24/2002   Qualifier: Diagnosis of  By: Jerral Ralph     Gastritis, chronic    Hiatal hernia    Hypertension    Low back pain    LPRD (laryngopharyngeal reflux disease) 04/04/2014   Osteoporosis 05/22/2020   RBBB (right bundle branch block with left anterior fascicular block)    Renal cyst    Venous insufficiency    Past Surgical History:   Procedure Laterality Date   CARPAL TUNNEL RELEASE     Left mastectomy     Social History   Socioeconomic History   Marital status: Widowed    Spouse name: Not on file   Number of children: 1   Years of education: Not on file   Highest education level: Not on file  Occupational History   Occupation: Retired    Fish farm manager: RETIRED   Occupation: Certified Nursing Assistance  Tobacco Use   Smoking status: Never   Smokeless tobacco: Never  Vaping Use   Vaping Use: Never used  Substance and Sexual Activity   Alcohol use: No    Alcohol/week: 0.0 standard drinks   Drug use: No   Sexual activity: Not on file  Other Topics Concern   Not on file  Social History Narrative    Social History 07/09/2017        Patient is hard of hearing, uses a 4 leg cane or walker, on wait list for meals on wheels (12/29/19)   Who lives at home: Patient lives alone in one level home; son lives across the street     Transportation: son and family transport to appointments      Important Relationships Son, Friends Jerlyn Ly and Kalman Shan 07/09/2017    Pets: None 07/09/2017  Education / Work:  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 Ducatte, RN, BSN        Casimer Lanius, LCSW   Clinical Social Worker   Updated 12/29/2019                                                                                          Tobacco use, amount per day now: No   Past tobacco use, amount per day: No   How many years did you use tobacco: No   Alcohol use (drinks per week): No   Diet: No   Do you drink/eat things with caffeine: Yes, sometimes.   Marital status: Widow                                  What year were you married?    Do you live in a house, apartment, assisted living, condo, trailer, etc.? House   Is it one  or more stories? One.   How many persons live in your home? 1   Do you have pets in your home?( please list) No   Highest Level of education completed?   Current or past profession: Actuary.   Do you exercise? No                                 Type and how often?   Do you have a living will? Yes.   Do you have a DNR form? No                                  If not, do you want to discuss one?   Do you have signed POA/HPOA forms?                        If so, please bring to you appointment      Do you have any difficulty bathing or dressing yourself? No.   Do you have any difficulty preparing food or eating? No.   Do you have any difficulty managing your medications? Yes.   Do you have any difficulty managing your finances? No.   Do you have any difficulty affording your medications?    Social Determinants of Health   Financial Resource Strain: Not on file  Food Insecurity: Not on file  Transportation Needs: Not on file  Physical Activity: Not on file  Stress: Not on file  Social Connections: Not on file   Family History  Problem Relation Age of Onset   Colon cancer Neg Hx    Pancreatic cancer Neg Hx    Esophageal cancer Neg Hx    Stomach cancer Neg Hx    - negative except otherwise stated in the family  history section Allergies  Allergen Reactions   Fentanyl Nausea Only and Other (See Comments)    Pt states that she is not allergic     Naprosyn [Naproxen] Other (See Comments)    Patient felt tongue and face swelling. Went to ED where no visible swelling noted   Prior to Admission medications   Medication Sig Start Date End Date Taking? Authorizing Provider  acetaminophen (TYLENOL) 500 MG tablet Take 500 mg by mouth every 6 (six) hours as needed for moderate pain or headache.   Yes [provider]  ALPRAZolam (XANAX) 0.25 MG tablet Take 1 tablet (0.25 mg total) by mouth 2 (two) times daily as needed. for anxiety 07/31/21  Yes Ngetich, Dinah C, NP  carvedilol  (COREG) 3.125 MG tablet Take 1 tablet by mouth twice daily 05/13/21  Yes Ngetich, Dinah C, NP  cholecalciferol (VITAMIN D) 1000 units tablet Take 1,000 Units by mouth daily.   Yes [provider]  famotidine (PEPCID) 20 MG tablet Take 1 tablet (20 mg total) by mouth daily. 05/27/21  Yes Ngetich, Dinah C, NP  feeding supplement, ENSURE ENLIVE, (ENSURE ENLIVE) LIQD Take 237 mLs by mouth 2 (two) times daily between meals. 09/30/16  Yes Nche, Charlene Brooke, NP  furosemide (LASIX) 20 MG tablet Take 0.5 tablets (10 mg total) by mouth every other day. TAKE 1/2 (ONE-HALF) TABLET BY MOUTH EVERY other DAY 10/30/21  Yes Medina-Vargas, Monina C, NP  levocetirizine (XYZAL) 5 MG tablet Take 1 tablet (5 mg total) by mouth daily. 08/19/21  Yes Ngetich, Dinah C, NP  lidocaine (LIDODERM) 5 % Place 1 patch onto the skin as needed. Remove & Discard patch within 12 hours or as directed by MD   Yes [provider]  Multiple Vitamins-Minerals (MULTIVITAMINS THER. W/MINERALS) TABS Take 1 tablet by mouth daily.   Yes [provider]  polyethylene glycol (MIRALAX / GLYCOLAX) 17 g packet Take 17 g by mouth daily.   Yes [provider]  Propylene Glycol (SYSTANE COMPLETE OP) Apply 1 drop to eye daily as needed (dry eyes).   Yes [provider]  sennosides-docusate sodium (SENOKOT-S) 8.6-50 MG tablet Take 1-2 tablets by mouth daily as needed for constipation.   Yes [provider]  spironolactone (ALDACTONE) 25 MG tablet Take 1 tablet (25 mg total) by mouth daily. 07/31/21  Yes Ngetich, Dinah C, NP  zinc oxide (BALMEX) 11.3 % CREA cream Apply 1 application topically 2 (two) times daily as needed.   Yes [provider]   DG Chest Port 1 View  Result Date: 11/11/2021 CLINICAL DATA:  Fall EXAM: PORTABLE CHEST 1 VIEW COMPARISON:  12/05/2018 FINDINGS: Rotated AP portable examination. Mild cardiomegaly. No acute abnormality of the lungs. No obvious displaced rib fracture or  other abnormality. IMPRESSION: Rotated AP portable examination. Mild cardiomegaly. No acute abnormality of the lungs. No obvious displaced rib fracture or other abnormality. Electronically Signed   By: Delanna Ahmadi M.D.   On: 11/11/2021 11:42   DG Hips Bilat W or Wo Pelvis 3-4 Views  Result Date: 11/11/2021 CLINICAL DATA:  Right hip pain after fall EXAM: DG HIP (WITH OR WITHOUT PELVIS) 3-4V BILAT COMPARISON:  10/04/2019 FINDINGS: Acute intertrochanteric fracture of the right femur with mild foreshortening and varus angulation. Lesser trochanteric fragment is slightly displaced. Hip joint intact without dislocation. No left-sided hip fracture. Diffuse osseous demineralization. Degenerative changes of the bilateral hips and sacroiliac joints. IMPRESSION: Acute mildly displaced and angulated intertrochanteric fracture of the right femur. Electronically Signed  By: Davina Poke D.O.   On: 11/11/2021 11:43     Positive ROS: All other systems have been reviewed and were otherwise negative with the exception of those mentioned in the HPI and as above.  Physical Exam: General: No acute distress Cardiovascular: No pedal edema Respiratory: No cyanosis, no use of accessory musculature GI: No organomegaly, abdomen is soft and non-tender Skin: No lesions in the area of chief complaint Neurologic: Sensation intact distally Psychiatric: Patient is at baseline mood and affect Lymphatic: No axillary or cervical lymphadenopathy  MUSCULOSKELETAL:  Right hip is shortened and externally rotated.  Pain with logroll the right hip.  Otherwise a unable to comply with physical examination.  She spontaneously wiggles her toes.  Toes are warm and well-perfused  Independent Imaging Review: X-ray AP pelvis and 2 views right hip: Displaced right intertrochanteric hip fracture  Assessment: 87 year old female with a displaced right intertrochanteric hip fracture after a fall while at home today.  Given her  previous status as a home ambulator with walker, I do believe she would be a candidate for surgical fixation and intramedullary nailing.  With that being said she is currently combative in the emergency room and is not willing to comply with any component of the history and physical exam.  Per her son [name], this is not her normal baseline.  To that effect I believe there may be a component of delirium for which this needs to be worked up and I would also appreciate a capacity evaluation per the psychiatric team.  Both myself and the medical team as well as [name] are in agreement that surgical fixation would be the best possible course of the patient is refusing at this time.  Plan: -Appreciate preoperative medical clearance per the medical team -Appreciate psychiatric capacity evaluation per psychiatry team -Given the fact that she seems to be acutely delirious with plan to defer surgery until Wednesday the 28th so that a work-up may be performed.   Thank you for the consult and the opportunity to see Ms. Dellia Beckwith, MD [facility] 3:23 PM

## 2021-11-11 NOTE — Progress Notes (Signed)
Asked by Dr. Olevia Bowens to evaluate pt for medical decision making capacity.  Pt seen and examined. She states she fell in the bathroom today while trying to pull her panties up.  Xray reveal right femoral intertrochanteric fracture.  Pt now agitated and refusing care.  Pt not oriented to time. Refuses to answer question of what month or year it is.  She knows that she is at [facility].  Pt confused and agitated. Will not allow writer to examine her.   Pt continues to states she needs a blanket despite a blanket being on top of her.  When I tried to readjust her blanket to cover her up more, she pulled down the blanket and then demands she get another blanket.  Pt also states she is going home and doesn't understand why she cannot walk.  Pt not capable of understanding that right femur fracture precludes her ability to walk.  In my medical opinion, pt is suffering from acute delirium(from unknown cause) and is incapable at present moment in making medical decisions for herself.  She is at risk for self harm if she tries to stand/walk.  Recommend sedation so that ortho can proceed with surgery if pt's son consents to this. Pt may also need physical restraints to limit her ability to try and stand...which would most certainly cause another fall and potentially another serious bodily injury.  Kristopher Oppenheim, DO [facility]

## 2021-11-11 NOTE — ED Triage Notes (Signed)
Pt to er via [place].   Per PT[facility] pt is here for a fall today. Pt states that she was standing up and lost her balance and fell.  Pt denies hitting her head or passing out.  Pt c/o R hip pain. Pt oriented to person place and time, pt knows she is at [place] [facility] and it is Monday.

## 2021-11-11 NOTE — H&P (Signed)
History and Physical    Heather [name] BZJ:[id number] DOB: 15-Sep-1918 DOA: 11/11/2021  PCP: Sandrea Hughs, NP   Patient coming from: Home.   I have personally briefly reviewed patient's old medical records in [place]  Chief Complaint: Fall.  HPI: Heather [name] is a 87 y.o. female with below past medical history who was brought in via EMS after having a fall at home while trying to pull out of her pajamas.  She lost balance and fell down injuring her right hip.  This was a mechanical fall.  There was no head injury or LOC.  Patient is confused and delirious according to her son [name].  She wants to go home and does not understand why she is unable to go home.  She did not allow me to examine her.  ED Course: Initial vital signs were temperature 98.2 F, pulse 85, respiration 18, BP 139/80 mmHg O2 sat 100% on room air. The patient did not allow labs to be drawn or an IV to be placed.  She denied she was having significant pain when I saw her.  Review of Systems: Unable to obtain.  Past Medical History:  Diagnosis Date   Abdominal pain, left lower quadrant 06/22/2013   Abnormal CT scan of lung 07/16/2015   Allergic rhinitis, cause unspecified 02/21/2014   Anxiety state, unspecified    Breast cancer ([place])    NO STICK OR BLOOD PRESSURE CHECKS IN LEFT ARM   Carpal tunnel syndrome    Carpal tunnel syndrome on both sides 10/03/2008   Qualifier: Diagnosis of  By: Lenna Gilford MD, Deborra Medina    Cervical spondylosis    Chronic pain syndrome 03/03/2019   Colon polyp 12/19/2009   Transverse-polypoid colorectal mucosa   Constipation    Cough 10/21/2014   Degenerative joint disease    Diastolic dysfunction    Diverticula of colon    Diverticulosis of colon 05/24/2002   Qualifier: Diagnosis of  By: Jerral Ralph     Gastritis, chronic    Hiatal hernia    Hypertension    Low back pain    LPRD (laryngopharyngeal reflux disease) 04/04/2014   Osteoporosis 05/22/2020   RBBB (right  bundle branch block with left anterior fascicular block)    Renal cyst    Venous insufficiency    Past Surgical History:  Procedure Laterality Date   CARPAL TUNNEL RELEASE     Left mastectomy      Social History  reports that she has never smoked. She has never used smokeless tobacco. She reports that she does not drink alcohol and does not use drugs.  Allergies  Allergen Reactions   Fentanyl Nausea Only and Other (See Comments)    Pt states that she is not allergic     Naprosyn [Naproxen] Other (See Comments)    Patient felt tongue and face swelling. Went to ED where no visible swelling noted   Family History  Problem Relation Age of Onset   Colon cancer Neg Hx    Pancreatic cancer Neg Hx    Esophageal cancer Neg Hx    Stomach cancer Neg Hx    Prior to Admission medications   Medication Sig Start Date End Date Taking? Authorizing Provider  acetaminophen (TYLENOL) 500 MG tablet Take 500 mg by mouth every 6 (six) hours as needed for moderate pain or headache.   Yes [provider]  ALPRAZolam (XANAX) 0.25 MG tablet Take 1 tablet (0.25 mg total) by mouth 2 (two) times  daily as needed. for anxiety 07/31/21  Yes Ngetich, Dinah C, NP  carvedilol (COREG) 3.125 MG tablet Take 1 tablet by mouth twice daily 05/13/21  Yes Ngetich, Dinah C, NP  cholecalciferol (VITAMIN D) 1000 units tablet Take 1,000 Units by mouth daily.   Yes [provider]  famotidine (PEPCID) 20 MG tablet Take 1 tablet (20 mg total) by mouth daily. 05/27/21  Yes Ngetich, Dinah C, NP  feeding supplement, ENSURE ENLIVE, (ENSURE ENLIVE) LIQD Take 237 mLs by mouth 2 (two) times daily between meals. 09/30/16  Yes Nche, Charlene Brooke, NP  furosemide (LASIX) 20 MG tablet Take 0.5 tablets (10 mg total) by mouth every other day. TAKE 1/2 (ONE-HALF) TABLET BY MOUTH EVERY other DAY 10/30/21  Yes Medina-Vargas, Monina C, NP  levocetirizine (XYZAL) 5 MG tablet Take 1 tablet (5 mg total) by mouth daily. 08/19/21  Yes  Ngetich, Dinah C, NP  lidocaine (LIDODERM) 5 % Place 1 patch onto the skin as needed. Remove & Discard patch within 12 hours or as directed by MD   Yes [provider]  Multiple Vitamins-Minerals (MULTIVITAMINS THER. W/MINERALS) TABS Take 1 tablet by mouth daily.   Yes [provider]  polyethylene glycol (MIRALAX / GLYCOLAX) 17 g packet Take 17 g by mouth daily.   Yes [provider]  Propylene Glycol (SYSTANE COMPLETE OP) Apply 1 drop to eye daily as needed (dry eyes).   Yes [provider]  sennosides-docusate sodium (SENOKOT-S) 8.6-50 MG tablet Take 1-2 tablets by mouth daily as needed for constipation.   Yes [provider]  spironolactone (ALDACTONE) 25 MG tablet Take 1 tablet (25 mg total) by mouth daily. 07/31/21  Yes Ngetich, Dinah C, NP  zinc oxide (BALMEX) 11.3 % CREA cream Apply 1 application topically 2 (two) times daily as needed.   Yes [provider]   Physical Exam: Vitals:   11/11/21 1213 11/11/21 1230 11/11/21 1300 11/11/21 1326  BP: 138/75 130/78 (!) 126/92 131/66  Pulse: 82 81 88 86  Resp: 12 11 20 19   Temp:    97.7 F (36.5 C)  TempSrc:    Oral  SpO2: 100% 99% 93% 99%  Weight:      Height:       Constitutional: Restless. Eyes: Unable to perform. ENMT: Unable to perform. Neck: Supple. Respiratory: Unable to perform. Cardiovascular: Unable to perform. Abdomen: Would not allow to be examined.  Musculoskeletal: Shortened, laterally rotated RLE.   Neurologic: Grossly nonfocal on minimal examination Psychiatric: Oriented to person and place, disoriented to time and situation.  Labs on Admission: I have personally reviewed following labs and imaging studies  CBC: No results for input(s): WBC, NEUTROABS, HGB, HCT, MCV, PLT in the last 168 hours.  Basic Metabolic Panel: No results for input(s): NA, K, CL, CO2, GLUCOSE, BUN, CREATININE, CALCIUM, MG, PHOS in the last 168 hours.  GFR: Estimated Creatinine  Clearance: 20.1 mL/min (A) (by C-G formula based on SCr of 1.02 mg/dL (H)).  Liver Function Tests: No results for input(s): AST, ALT, ALKPHOS, BILITOT, PROT, ALBUMIN in the last 168 hours.  Radiological Exams on Admission: DG Chest Port 1 View  Result Date: 11/11/2021 CLINICAL DATA:  Fall EXAM: PORTABLE CHEST 1 VIEW COMPARISON:  12/05/2018 FINDINGS: Rotated AP portable examination. Mild cardiomegaly. No acute abnormality of the lungs. No obvious displaced rib fracture or other abnormality. IMPRESSION: Rotated AP portable examination. Mild cardiomegaly. No acute abnormality of the lungs. No obvious displaced rib fracture or other abnormality. Electronically Signed  By: Delanna Ahmadi M.D.   On: 11/11/2021 11:42   DG Hips Bilat W or Wo Pelvis 3-4 Views  Result Date: 11/11/2021 CLINICAL DATA:  Right hip pain after fall EXAM: DG HIP (WITH OR WITHOUT PELVIS) 3-4V BILAT COMPARISON:  10/04/2019 FINDINGS: Acute intertrochanteric fracture of the right femur with mild foreshortening and varus angulation. Lesser trochanteric fragment is slightly displaced. Hip joint intact without dislocation. No left-sided hip fracture. Diffuse osseous demineralization. Degenerative changes of the bilateral hips and sacroiliac joints. IMPRESSION: Acute mildly displaced and angulated intertrochanteric fracture of the right femur. Electronically Signed   By: Davina Poke D.O.   On: 11/11/2021 11:43    EKG: Independently reviewed.  Has not allowed staff to obtain EKG.  Assessment/Plan Principal Problem:   Closed displaced fracture of right femoral neck (HCC) Admit to telemetry/inpatient. Buck's traction per protocol. Analgesics as needed. Antiemetics as needed. Consult nutritional services. Consult TOC team. Evaluation for capacity determination. Depending on capacity results, orthopedic surgery to intervene or not.  Active Problems:   Restlessness/delirium The patient is declining surgery or IV  insertion. In my opinion, the patient is unable to make clear decisions. Orthopedic surgery would like psychiatry for capacity determination.    Anxiety state Avoid benzodiazepines. Risperidone or low-dose Haldol as needed.    Primary hypertension Continue carvedilol 6.25 mg p.o. twice daily. Continue furosemide. Monitor BP, heart rate, renal function electrolytes.    Diastolic CHF ([place]) No signs of decompensation. Continue carvedilol and furosemide.    GERD without esophagitis   Constipation Continue MiraLAX and stool softeners.    DVT prophylaxis: SCDs. Code Status:   Full code. Family Communication:  Her son [name] was in the room. Disposition Plan:   Patient is from:  Home.  Anticipated DC to:  Home.  Anticipated DC date:  11/15/2021.  Anticipated DC barriers: Clinical status.  Consults called:  Donivan Scull, MD (orthopedic surgery). Psychiatry consult was requested for capacity determination. Admission status:  Inpatient/telemetry.  Severity of Illness:  Reubin Milan MD [facility]  How to contact the [facility] Attending or Consulting provider [place] or covering provider during after hours [place], for this patient?   Check the care team in [facility] and look for a) attending/consulting [facility] provider listed and b) the [facility] team listed Log into [email or web address] and use [place]'s universal password to access. If you do not have the password, please contact the hospital operator. Locate the [facility] provider you are looking for under [facility] and page to a number that you can be directly reached. If you still have difficulty reaching the provider, please page the [facility] (Director on Call) for the Hospitalists listed on amion for assistance.  11/11/2021, 3:31 PM   This document was prepared with Dragon voice recognition software and may contain some unintended transcription errors.

## 2021-11-11 NOTE — ED Provider Notes (Signed)
[facility] DEPT Provider Note   CSN: [id number] Arrival date & time: 11/11/21  3536     History Chief Complaint  Patient presents with   Laveda Abbe AANIYAH STROHM is a 87 y.o. female.  HPI  87 year old female presents to the emergency department with fall and hip pain.  Patient states that she was standing up and using the restroom, when she went to pull her PJs up she lost her balance and fell down onto both of her knees.  She is complaining of right greater than left hip pain.  Did not hit her head, no loss of consciousness, no syncope.  She denies any back or other extremity traumatic injury or pain.  States she is otherwise been in her usual state of health.  Past Medical History:  Diagnosis Date   Abdominal pain, left lower quadrant 06/22/2013   Abnormal CT scan of lung 07/16/2015   Allergic rhinitis, cause unspecified 02/21/2014   Anxiety state, unspecified    Breast cancer ([place])    NO STICK OR BLOOD PRESSURE CHECKS IN LEFT ARM   Carpal tunnel syndrome    Carpal tunnel syndrome on both sides 10/03/2008   Qualifier: Diagnosis of  By: Lenna Gilford MD, Deborra Medina    Cervical spondylosis    Chronic pain syndrome 03/03/2019   Colon polyp 12/19/2009   Transverse-polypoid colorectal mucosa   Constipation    Cough 10/21/2014   Degenerative joint disease    Diastolic dysfunction    Diverticula of colon    Diverticulosis of colon 05/24/2002   Qualifier: Diagnosis of  By: Jerral Ralph     Gastritis, chronic    Hiatal hernia    Hypertension    Low back pain    LPRD (laryngopharyngeal reflux disease) 04/04/2014   Osteoporosis 05/22/2020   RBBB (right bundle branch block with left anterior fascicular block)    Renal cyst    Venous insufficiency     Patient Active Problem List   Diagnosis Date Noted   Seasonal allergies 06/14/2021   Primary osteoarthritis involving multiple joints 06/14/2021   Pre-ulcerative corn or callous 10/04/2020    Osteoporosis 05/22/2020   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 ([place]) 05/17/2020   Bronchiectasis ([place]) 05/17/2020   Kyphosis of thoracic region 05/17/2020   SI (sacroiliac) joint inflammation ([place]) 10/04/2019   DDD (degenerative disc disease), lumbar 10/04/2019   Hearing decreased, bilateral 06/30/2019   Chronic pain syndrome 03/03/2019   Unilateral primary osteoarthritis, right knee 01/13/2019   Medication management 02/28/2018   Urinary incontinence without sensory awareness 12/02/2017   Arthritis of left hip 10/20/2017   Functional abdominal pain syndrome 09/02/2017   Encounter for chronic pain management 07/22/2017   Dementia with behavioral disturbance 06/25/2017   Vitamin D deficiency 05/21/2017   Chronic LLQ pain 11/23/2014   Esophageal dysmotility 10/27/2014   Arthritis of hand, degenerative 04/10/2014   Other and unspecified hyperlipidemia 04/10/2014   Chronic bronchitis ([place]) 05/30/2013   Hx of breast cancer 08/30/2012   Venous insufficiency of both lower extremities 06/12/2011   GERD without esophagitis 14/43/1540   Diastolic CHF ([place]) 08/67/6195   Constipation 01/18/2009   Cervical spondylosis without myelopathy 03/21/2008   Diaphragmatic hernia 12/26/2007   Anxiety state 09/15/2007   Primary hypertension 09/15/2007   Venous (peripheral) insufficiency 09/15/2007    Past Surgical History:  Procedure Laterality Date   CARPAL TUNNEL RELEASE     Left mastectomy       OB History   No obstetric  history on file.     Family History  Problem Relation Age of Onset   Colon cancer Neg Hx    Pancreatic cancer Neg Hx    Esophageal cancer Neg Hx    Stomach cancer Neg Hx     Social History   Tobacco Use   Smoking status: Never   Smokeless tobacco: Never  Vaping Use   Vaping Use: Never used  Substance Use Topics   Alcohol use: No    Alcohol/week: 0.0 standard drinks   Drug use: No    Home Medications Prior to Admission medications   Medication Sig Start Date End  Date Taking? Authorizing Provider  acetaminophen (TYLENOL) 500 MG tablet Take 500 mg by mouth every 6 (six) hours as needed for moderate pain or headache.    [provider]  ALPRAZolam Duanne Moron) 0.25 MG tablet Take 1 tablet (0.25 mg total) by mouth 2 (two) times daily as needed. for anxiety 07/31/21   Ngetich, Dinah C, NP  carvedilol (COREG) 3.125 MG tablet Take 1 tablet by mouth twice daily 05/13/21   Ngetich, Dinah C, NP  cholecalciferol (VITAMIN D) 1000 units tablet Take 1,000 Units by mouth daily.    [provider]  famotidine (PEPCID) 20 MG tablet Take 1 tablet (20 mg total) by mouth daily. 05/27/21   Ngetich, Dinah C, NP  feeding supplement, ENSURE ENLIVE, (ENSURE ENLIVE) LIQD Take 237 mLs by mouth 2 (two) times daily between meals. 09/30/16   Nche, Charlene Brooke, NP  furosemide (LASIX) 20 MG tablet Take 0.5 tablets (10 mg total) by mouth every other day. TAKE 1/2 (ONE-HALF) TABLET BY MOUTH EVERY other DAY 10/30/21   Medina-Vargas, Monina C, NP  levocetirizine (XYZAL) 5 MG tablet Take 1 tablet (5 mg total) by mouth daily. 08/19/21   Ngetich, Dinah C, NP  lidocaine (LIDODERM) 5 % Place 1 patch onto the skin as needed. Remove & Discard patch within 12 hours or as directed by MD    [provider]  Multiple Vitamins-Minerals (MULTIVITAMINS THER. W/MINERALS) TABS Take 1 tablet by mouth daily.    [provider]  polyethylene glycol (MIRALAX / GLYCOLAX) 17 g packet Take 17 g by mouth 2 (two) times daily.    [provider]  Propylene Glycol (SYSTANE COMPLETE OP) Apply 1 drop to eye daily as needed (dry eyes).    [provider]  sennosides-docusate sodium (SENOKOT-S) 8.6-50 MG tablet Take 1-2 tablets by mouth daily.    [provider]  spironolactone (ALDACTONE) 25 MG tablet Take 1 tablet (25 mg total) by mouth daily. 07/31/21   Ngetich, Dinah C, NP  zinc oxide (BALMEX) 11.3 % CREA cream Apply 1 application topically 2 (two) times daily as  needed.    [provider]    Allergies    Fentanyl and Naprosyn [naproxen]  Review of Systems   Review of Systems  Constitutional:  Negative for chills and fever.  HENT:  Negative for congestion.   Eyes:  Negative for visual disturbance.  Respiratory:  Negative for shortness of breath.   Cardiovascular:  Negative for chest pain.  Gastrointestinal:  Negative for abdominal pain, diarrhea and vomiting.  Genitourinary:  Negative for dysuria.  Musculoskeletal:  Negative for back pain and neck pain.       + bl hip pain  Skin:  Negative for rash.  Neurological:  Negative for syncope and headaches.   Physical Exam Updated Vital Signs BP 139/80 (BP Location: Right Arm)    Pulse 85  Temp 98.2 F (36.8 C) (Oral)    Resp 18    Ht 4\' 10"  (1.473 m)    Wt 49.9 kg    SpO2 100%    BMI 22.99 kg/m   Physical Exam Vitals and nursing note reviewed.  Constitutional:      General: She is not in acute distress.    Appearance: Normal appearance.  HENT:     Head: Normocephalic.     Mouth/Throat:     Mouth: Mucous membranes are moist.  Cardiovascular:     Rate and Rhythm: Normal rate.  Pulmonary:     Effort: Pulmonary effort is normal. No respiratory distress.  Abdominal:     Palpations: Abdomen is soft.     Tenderness: There is no abdominal tenderness.  Musculoskeletal:     Comments: TTP of bl hips, right greater than left, lower extremities both bent to the right and equal length, equal palpable distal pulses  Skin:    General: Skin is warm.  Neurological:     Mental Status: She is alert and oriented to person, place, and time. Mental status is at baseline.  Psychiatric:        Mood and Affect: Mood normal.    ED Results / Procedures / Treatments   Labs (all labs ordered are listed, but only abnormal results are displayed) Labs Reviewed - No data to display  EKG None  Radiology No results found.  Procedures Procedures   Medications Ordered in ED Medications - No  data to display  ED Course  I have reviewed the triage vital signs and the nursing notes.  Pertinent labs & imaging results that were available during my care of the patient were reviewed by me and considered in my medical decision making (see chart for details).    MDM Rules/Calculators/A&P                          87 year old female presents emergency department status post mechanical fall with hip pain, right worse than left.  Vitals are stable on arrival.  She is alert and oriented.  Apparently independent, ambulatory.  Son [name] is at bedside.  X-ray shows right hip fracture.  Orthopedics was consulted.  Given her independent and ambulatory state surgery was recommended.  Her son [name] agreed to surgery.  However on reevaluation of the patient after a nap she woke up disoriented, combative.  Refusing any further evaluation, blood work or urine.  She has a strong smell of urine and has soiled herself.  Concern for acute delirium, possible UTI.  Orthopedics updated, surgery put on hold for now.  We will plan for medical admission.  Evaluate for medical delirium versus psychiatric cause.  Son [name] at bedside and understands.  Patients evaluation and results requires admission for further treatment and care. Patient agrees with admission plan, offers no new complaints and is stable/unchanged at time of admit.     Final Clinical Impression(s) / ED Diagnoses Final diagnoses:  None    Rx / DC Orders ED Discharge Orders     None        Lorelle Gibbs, DO 11/12/21 1655

## 2021-11-11 NOTE — ED Notes (Signed)
Pt took off her O2 monitor and refuses to have it on now

## 2021-11-11 NOTE — ED Notes (Signed)
PA talking with pt, pt refusing surgery at this time,

## 2021-11-12 DIAGNOSIS — N390 Urinary tract infection, site not specified: Secondary | ICD-10-CM

## 2021-11-12 DIAGNOSIS — I10 Essential (primary) hypertension: Secondary | ICD-10-CM

## 2021-11-12 DIAGNOSIS — Z9012 Acquired absence of left breast and nipple: Secondary | ICD-10-CM | POA: Diagnosis not present

## 2021-11-12 DIAGNOSIS — Z853 Personal history of malignant neoplasm of breast: Secondary | ICD-10-CM | POA: Diagnosis not present

## 2021-11-12 DIAGNOSIS — G894 Chronic pain syndrome: Secondary | ICD-10-CM | POA: Diagnosis present

## 2021-11-12 DIAGNOSIS — R451 Restlessness and agitation: Secondary | ICD-10-CM

## 2021-11-12 DIAGNOSIS — I5032 Chronic diastolic (congestive) heart failure: Secondary | ICD-10-CM

## 2021-11-12 DIAGNOSIS — U071 COVID-19: Secondary | ICD-10-CM | POA: Diagnosis present

## 2021-11-12 DIAGNOSIS — L89156 Pressure-induced deep tissue damage of sacral region: Secondary | ICD-10-CM | POA: Diagnosis not present

## 2021-11-12 DIAGNOSIS — M159 Polyosteoarthritis, unspecified: Secondary | ICD-10-CM | POA: Diagnosis present

## 2021-11-12 DIAGNOSIS — K5900 Constipation, unspecified: Secondary | ICD-10-CM | POA: Diagnosis present

## 2021-11-12 DIAGNOSIS — F411 Generalized anxiety disorder: Secondary | ICD-10-CM | POA: Diagnosis present

## 2021-11-12 DIAGNOSIS — K219 Gastro-esophageal reflux disease without esophagitis: Secondary | ICD-10-CM | POA: Diagnosis present

## 2021-11-12 DIAGNOSIS — I110 Hypertensive heart disease with heart failure: Secondary | ICD-10-CM | POA: Diagnosis present

## 2021-11-12 DIAGNOSIS — G9341 Metabolic encephalopathy: Secondary | ICD-10-CM | POA: Diagnosis present

## 2021-11-12 DIAGNOSIS — E559 Vitamin D deficiency, unspecified: Secondary | ICD-10-CM | POA: Diagnosis present

## 2021-11-12 DIAGNOSIS — Z79899 Other long term (current) drug therapy: Secondary | ICD-10-CM | POA: Diagnosis not present

## 2021-11-12 DIAGNOSIS — I872 Venous insufficiency (chronic) (peripheral): Secondary | ICD-10-CM | POA: Diagnosis present

## 2021-11-12 DIAGNOSIS — W010XXA Fall on same level from slipping, tripping and stumbling without subsequent striking against object, initial encounter: Secondary | ICD-10-CM | POA: Diagnosis present

## 2021-11-12 DIAGNOSIS — Y92002 Bathroom of unspecified non-institutional (private) residence single-family (private) house as the place of occurrence of the external cause: Secondary | ICD-10-CM | POA: Diagnosis not present

## 2021-11-12 DIAGNOSIS — J302 Other seasonal allergic rhinitis: Secondary | ICD-10-CM | POA: Diagnosis present

## 2021-11-12 DIAGNOSIS — D62 Acute posthemorrhagic anemia: Secondary | ICD-10-CM | POA: Diagnosis not present

## 2021-11-12 DIAGNOSIS — G3184 Mild cognitive impairment, so stated: Secondary | ICD-10-CM | POA: Diagnosis present

## 2021-11-12 DIAGNOSIS — M810 Age-related osteoporosis without current pathological fracture: Secondary | ICD-10-CM | POA: Diagnosis present

## 2021-11-12 DIAGNOSIS — K5730 Diverticulosis of large intestine without perforation or abscess without bleeding: Secondary | ICD-10-CM | POA: Diagnosis present

## 2021-11-12 DIAGNOSIS — S72001A Fracture of unspecified part of neck of right femur, initial encounter for closed fracture: Secondary | ICD-10-CM | POA: Diagnosis present

## 2021-11-12 DIAGNOSIS — I951 Orthostatic hypotension: Secondary | ICD-10-CM | POA: Diagnosis not present

## 2021-11-12 DIAGNOSIS — S72141A Displaced intertrochanteric fracture of right femur, initial encounter for closed fracture: Secondary | ICD-10-CM | POA: Diagnosis present

## 2021-11-12 DIAGNOSIS — L89626 Pressure-induced deep tissue damage of left heel: Secondary | ICD-10-CM | POA: Diagnosis present

## 2021-11-12 DIAGNOSIS — Z9181 History of falling: Secondary | ICD-10-CM | POA: Diagnosis not present

## 2021-11-12 DIAGNOSIS — E785 Hyperlipidemia, unspecified: Secondary | ICD-10-CM | POA: Diagnosis present

## 2021-11-12 LAB — SURGICAL PCR SCREEN
MRSA, PCR: NEGATIVE
Staphylococcus aureus: NEGATIVE

## 2021-11-12 LAB — URINALYSIS, ROUTINE W REFLEX MICROSCOPIC
Bilirubin Urine: NEGATIVE
Glucose, UA: NEGATIVE mg/dL
Ketones, ur: NEGATIVE mg/dL
Nitrite: NEGATIVE
Protein, ur: 30 mg/dL — AB
Specific Gravity, Urine: 1.015 (ref 1.005–1.030)
WBC, UA: >50 WBC/hpf — ABNORMAL HIGH (ref 0–5)
pH: 7 (ref 5.0–8.0)

## 2021-11-12 LAB — COMPREHENSIVE METABOLIC PANEL
ALT: 28 U/L (ref 0–44)
AST: 54 U/L — ABNORMAL HIGH (ref 15–41)
Albumin: 3.4 g/dL — ABNORMAL LOW (ref 3.5–5.0)
Alkaline Phosphatase: 41 U/L (ref 38–126)
Anion gap: 7 (ref 5–15)
BUN: 18 mg/dL (ref 8–23)
CO2: 29 mmol/L (ref 22–32)
Calcium: 8.7 mg/dL — ABNORMAL LOW (ref 8.9–10.3)
Chloride: 107 mmol/L (ref 98–111)
Creatinine, Ser: 0.81 mg/dL (ref 0.44–1.00)
GFR, Estimated: >60 mL/min (ref >60)
Glucose, Bld: 125 mg/dL — ABNORMAL HIGH (ref 70–99)
Potassium: 4.2 mmol/L (ref 3.5–5.1)
Sodium: 143 mmol/L (ref 135–145)
Total Bilirubin: 1.3 mg/dL — ABNORMAL HIGH (ref 0.3–1.2)
Total Protein: 6.7 g/dL (ref 6.5–8.1)

## 2021-11-12 LAB — CBC
HCT: 38.5 % (ref 36.0–46.0)
Hemoglobin: 12.4 g/dL (ref 12.0–15.0)
MCH: 30.8 pg (ref 26.0–34.0)
MCHC: 32.2 g/dL (ref 30.0–36.0)
MCV: 95.5 fL (ref 80.0–100.0)
Platelets: 159 10*3/uL (ref 150–400)
RBC: 4.03 MIL/uL (ref 3.87–5.11)
RDW: 13.7 % (ref 11.5–15.5)
WBC: 9.9 10*3/uL (ref 4.0–10.5)
nRBC: 0 % (ref 0.0–0.2)

## 2021-11-12 LAB — RESP PANEL BY RT-PCR (FLU A&B, COVID) ARPGX2
Influenza A by PCR: NEGATIVE
Influenza B by PCR: NEGATIVE
SARS Coronavirus 2 by RT PCR: POSITIVE — AB

## 2021-11-12 LAB — ABO/RH: ABO/RH(D): A POS

## 2021-11-12 MED ORDER — ENSURE ENLIVE PO LIQD
237.0000 mL | Freq: Two times a day (BID) | ORAL | Status: DC
  Administered 2021-11-12 – 2021-11-25 (×17): 237 mL via ORAL

## 2021-11-12 MED ORDER — ADULT MULTIVITAMIN W/MINERALS CH
1.0000 | ORAL_TABLET | Freq: Every day | ORAL | Status: DC
  Administered 2021-11-16 – 2021-11-26 (×11): 1 via ORAL
  Filled 2021-11-12 (×14): qty 1

## 2021-11-12 MED ORDER — ENSURE PRE-SURGERY PO LIQD
296.0000 mL | Freq: Once | ORAL | Status: DC
  Filled 2021-11-12: qty 296

## 2021-11-12 MED ORDER — SODIUM CHLORIDE 0.9 % IV SOLN
1.0000 g | INTRAVENOUS | Status: DC
Start: 2021-11-12 — End: 2021-11-16
  Administered 2021-11-12: 16:00:00 1 g via INTRAVENOUS
  Filled 2021-11-12 (×5): qty 10

## 2021-11-12 NOTE — TOC Progression Note (Signed)
Transition of Care [facility]) - Progression Note    Patient Details  Name: Heather [name] MRN: [id number] Date of Birth: [birth year]  Transition of Care [facility]) CM/SW Contact  Purcell Mouton, RN Phone Number: 11/12/2021, 11:57 AM  Clinical Narrative:    Spoke with pt's son [name] concerning discharge plans. [name] states pt is from home and would do better going back home. Pt lives alone, son lives one block away and plan to be with pt when she discharge back home with Home Health. TOC will continue to follow.    Expected Discharge Plan: [place] Barriers to Discharge: No Barriers Identified  Expected Discharge Plan and Services Expected Discharge Plan: [place] Choice: [place] arrangements for the past 2 months: Single Family Home                                       Social Determinants of Health (SDOH) Interventions    Readmission Risk Interventions No flowsheet data found.

## 2021-11-12 NOTE — Consult Note (Signed)
Patient Identification:  Heather [name] Date of Evaluation:  11/12/2021 MRN; [id number]  Referring Provider: Dr Chryl Heck  History of Present Illness: Heather [name] is a 87 year-old female who was brought into the emergency room by EMS after she had a fall.  She lost her balance and injured her right hip.  Psych consult was called because of agitation and capacity.  Patient seen, evaluated and chart reviewed.  Patient remains very uncooperative and get easily upset and refusing to provide any information.  When questioned ask about her memory she replied living alone.  Patient niece and son was present in the room.  They told that patient is otherwise okay but hospital stay is making her more sad.  Patient admitted that she has a lot of complaints but close her eyes and refused to answer any question.  This morning she was given Haldol injection that calm her down.  During usual declining surgery however as per staff's surgery is scheduled tomorrow morning.  Her niece tried to talk to her but she did not answer her question either.  As per family members patient has episodes of memory issues is gradually up and down but she lived by herself.  As per family and the patient is very concerned that she may be transferred to the nursing home and she is scared to leave her house.  Her cousin checks her 3 times a week.  Patient is irritable, angry, loud speech and irrational.  She wants to be alone.  Patient refuses to talk.  As per family member patient has no previous psych history.  Past Psychiatric History: As per family member patient has no previous psychiatric history.   Past Medical History:     Past Medical History:  Diagnosis Date   Abdominal pain, left lower quadrant 06/22/2013   Abnormal CT scan of lung 07/16/2015   Allergic rhinitis, cause unspecified 02/21/2014   Anxiety state, unspecified    Breast cancer ([place])    NO STICK OR BLOOD PRESSURE CHECKS IN LEFT ARM   Carpal tunnel syndrome     Carpal tunnel syndrome on both sides 10/03/2008   Qualifier: Diagnosis of  By: Lenna Gilford MD, Deborra Medina    Cervical spondylosis    Chronic pain syndrome 03/03/2019   Colon polyp 12/19/2009   Transverse-polypoid colorectal mucosa   Constipation    Cough 10/21/2014   Degenerative joint disease    Diastolic dysfunction    Diverticula of colon    Diverticulosis of colon 05/24/2002   Qualifier: Diagnosis of  By: Jerral Ralph     Gastritis, chronic    Hiatal hernia    Hypertension    Low back pain    LPRD (laryngopharyngeal reflux disease) 04/04/2014   Osteoporosis 05/22/2020   RBBB (right bundle branch block with left anterior fascicular block)    Renal cyst    Venous insufficiency        Past Surgical History:  Procedure Laterality Date   CARPAL TUNNEL RELEASE     Left mastectomy      Allergies:  Allergies  Allergen Reactions   Fentanyl Nausea Only and Other (See Comments)    Pt states that she is not allergic     Naprosyn [Naproxen] Other (See Comments)    Patient felt tongue and face swelling. Went to ED where no visible swelling noted    Current Medications:  Prior to Admission medications   Medication Sig Start Date End Date Taking? Authorizing Provider  acetaminophen (TYLENOL) 500 MG tablet  Take 500 mg by mouth every 6 (six) hours as needed for moderate pain or headache.   Yes [provider]  ALPRAZolam (XANAX) 0.25 MG tablet Take 1 tablet (0.25 mg total) by mouth 2 (two) times daily as needed. for anxiety 07/31/21  Yes Ngetich, Dinah C, NP  carvedilol (COREG) 3.125 MG tablet Take 1 tablet by mouth twice daily 05/13/21  Yes Ngetich, Dinah C, NP  cholecalciferol (VITAMIN D) 1000 units tablet Take 1,000 Units by mouth daily.   Yes [provider]  famotidine (PEPCID) 20 MG tablet Take 1 tablet (20 mg total) by mouth daily. 05/27/21  Yes Ngetich, Dinah C, NP  feeding supplement, ENSURE ENLIVE, (ENSURE ENLIVE) LIQD Take 237 mLs by mouth 2 (two) times daily  between meals. 09/30/16  Yes Nche, Charlene Brooke, NP  furosemide (LASIX) 20 MG tablet Take 0.5 tablets (10 mg total) by mouth every other day. TAKE 1/2 (ONE-HALF) TABLET BY MOUTH EVERY other DAY 10/30/21  Yes Medina-Vargas, Monina C, NP  levocetirizine (XYZAL) 5 MG tablet Take 1 tablet (5 mg total) by mouth daily. 08/19/21  Yes Ngetich, Dinah C, NP  lidocaine (LIDODERM) 5 % Place 1 patch onto the skin as needed. Remove & Discard patch within 12 hours or as directed by MD   Yes [provider]  Multiple Vitamins-Minerals (MULTIVITAMINS THER. W/MINERALS) TABS Take 1 tablet by mouth daily.   Yes [provider]  polyethylene glycol (MIRALAX / GLYCOLAX) 17 g packet Take 17 g by mouth daily.   Yes [provider]  Propylene Glycol (SYSTANE COMPLETE OP) Apply 1 drop to eye daily as needed (dry eyes).   Yes [provider]  sennosides-docusate sodium (SENOKOT-S) 8.6-50 MG tablet Take 1-2 tablets by mouth daily as needed for constipation.   Yes [provider]  spironolactone (ALDACTONE) 25 MG tablet Take 1 tablet (25 mg total) by mouth daily. 07/31/21  Yes Ngetich, Dinah C, NP  zinc oxide (BALMEX) 11.3 % CREA cream Apply 1 application topically 2 (two) times daily as needed.   Yes [provider]    Social History:    reports that she has never smoked. She has never used smokeless tobacco. She reports that she does not drink alcohol and does not use drugs.   Family History:    Family History  Problem Relation Age of Onset   Colon cancer Neg Hx    Pancreatic cancer Neg Hx    Esophageal cancer Neg Hx    Stomach cancer Neg Hx     Psychiatric Specialty Exam: Physical Exam  Review of Systems  Blood pressure (!) 168/72, pulse (!) 109, temperature 98.7 F (37.1 C), temperature source Oral, resp. rate 20, height 4\' 10"  (1.473 m), weight 49.9 kg, SpO2 98 %.Body mass index is 22.99 kg/m.  General Appearance: Casual  Eye Contact:  Poor  Speech:   Pressured  Volume:  Increased  Mood:  Angry and Irritable  Affect:  Labile  Thought Process:  Descriptions of Associations: Loose  Orientation:  NA  Thought Content:  Illogical and Tangential  Suicidal Thoughts:  No  Homicidal Thoughts:  No  Memory:  Immediate;   Poor Recent;   Poor Remote;   Fair  Judgement:  Impaired  Insight:  Lacking  Psychomotor Activity:  Increased  Concentration:  Concentration: Poor and Attention Span: Poor  Recall:  Poor  Fund of Knowledge:  Poor  Language:  Poor  Akathisia:  No  Handed:  Right  AIMS (if indicated):  Assets:  Housing Social Support  ADL's:  Intact  Cognition:  Impaired,  Mild  Sleep:   fair       Assessment/Plan: Jaclyn Prime is 87 year old female with history of fall and displaced right hip fracture.  Patient remains very labile, irritable, uncooperative and agitated.  This morning she was given Haldol to calm her down.  Patient does not have capacity to participate in her treatment plan.  Spoke to her son [name] and her niece who endorsed no previous history of psychiatric illness.  Recommendation, Continued delirium protocol.  Continue Haldol injection IV every 6-8 hours for severe agitation.  Patient does not have capacity to participate in her treatment plan.  Psychiatry will sign off however if please consider reconsult if needed.      Berniece Andreas, MD Psychiatry

## 2021-11-12 NOTE — Progress Notes (Addendum)
PROGRESS NOTE  Heather [name] QMG:[id number] DOB: February 03, 1918 DOA: 11/11/2021 PCP: Sandrea Hughs, NP   LOS: 1 day   Brief narrative: Heather [name] is a 87 y.o. female with past medical history of anxiety, hypertension, diastolic congestive heart failure, GERD, was brought into the hospital by EMS after sustaining a fall at home after she lost her balance patient had a mechanical fall and had baseline is confused and delirious.  Initial vitals were stable.  Patient was then admitted hospital for further evaluation and treatment.  Assessment/Plan:  Principal Problem:   Closed displaced fracture of right femoral neck (HCC) Active Problems:   Anxiety state   Primary hypertension   Diastolic CHF ([place])   GERD without esophagitis   Constipation   Restlessness   Closed displaced fracture of right femoral neck  Orthopedic has seen the patient.  Patient does have baseline confusion and agitation and delirium.  Follow orthopedic recommendation.  Likely surgical intervention on 11/05/2021.  Patient's son at bedside    Restlessness/delirium Patient has refused oral medication.  She is however unable to make her decisions on the background of delirium, confusional state.  Patient's son is the Marine scientist.    Anxiety state Continue risperidone and Haldol.  Hold benzodiazepines.  Essential hypertension On Coreg and oral Lasix.  We will continue.  Has refused to oral medications this morning.  Chronic diastolic CHF  Continue Coreg Lasix.  No signs of decompensation.   GERD without esophagitis Continue MiraLAX and stool softeners.  Incidental COVID-positive.  No evidence of hypoxia.  We will hold off with any treatment.  Supportive care.  Abnormal urinalysis with confusion disorientation.  Possibility of UTI.  Patient is undergoing surgical intervention.  We will start patient on Rocephin IV.    DVT prophylaxis: SCDs Start: 11/11/21 1525   Code Status: Full code  Family  Communication: Spoke with the patient's son at bedside.  Status is: Inpatient  Remains inpatient appropriate because: Hip fracture likely needing surgery delirium confusional state.  Consultants: Orthopedics   Procedures: None yet  Anti-infectives:  None  Anti-infectives (From admission, onward)    Start     Dose/Rate Route Frequency Ordered Stop   11/13/21 0600  ceFAZolin (ANCEF) IVPB 2g/100 mL premix        2 g 200 mL/hr over 30 Minutes Intravenous On call to O.R. 11/11/21 1425 11/14/21 0559       Subjective: Today, patient was seen and examined at bedside.  Patient appears to be anxious and agitated and confused.  Patient's son at bedside.  Objective: Vitals:   11/11/21 2352 11/12/21 0401  BP: (!) 150/66 134/80  Pulse: 99 96  Resp: 18 18  Temp: 98.3 F (36.8 C) (!) 97.5 F (36.4 C)  SpO2: 100% 98%   No intake or output data in the 24 hours ending 11/12/21 0815 Filed Weights   11/11/21 0930  Weight: 49.9 kg   Body mass index is 22.99 kg/m.   Physical Exam:  GENERAL: Patient is alert awake but confused disoriented and mildly agitated. Not in obvious distress. HENT: No scleral pallor or icterus. Pupils equally reactive to light. Oral mucosa is moist NECK: is supple, no gross swelling noted. CHEST: Clear to auscultation. No crackles or wheezes.  Diminished breath sounds bilaterally. CVS: S1 and S2 heard, no murmur. Regular rate and rhythm.  ABDOMEN: Soft, non-tender, bowel sounds are present. EXTREMITIES: Right hip tenderness noted. CNS: Cranial nerves are intact.  Moves all extremities. SKIN: warm and dry without  rashes.  Data Review: I have personally reviewed the following laboratory data and studies,  CBC: Recent Labs  Lab 11/11/21 2115 11/12/21 0408  WBC 9.9 9.9  NEUTROABS 7.4  --   HGB 12.7 12.4  HCT 40.0 38.5  MCV 97.3 95.5  PLT 185 161   Basic Metabolic Panel: Recent Labs  Lab 11/11/21 2115 11/12/21 0408  NA 146* 143  K 4.8 4.2   CL 109 107  CO2 30 29  GLUCOSE 124* 125*  BUN 18 18  CREATININE 0.94 0.81  CALCIUM 8.9 8.7*   Liver Function Tests: Recent Labs  Lab 11/11/21 2115 11/12/21 0408  AST 48* 54*  ALT 28 28  ALKPHOS 40 41  BILITOT 1.3* 1.3*  PROT 6.7 6.7  ALBUMIN 3.7 3.4*   No results for input(s): LIPASE, AMYLASE in the last 168 hours. No results for input(s): AMMONIA in the last 168 hours. Cardiac Enzymes: No results for input(s): CKTOTAL, CKMB, CKMBINDEX, TROPONINI in the last 168 hours. BNP (last 3 results) No results for input(s): BNP in the last 8760 hours.  ProBNP (last 3 results) No results for input(s): PROBNP in the last 8760 hours.  CBG: No results for input(s): GLUCAP in the last 168 hours. Recent Results (from the past 240 hour(s))  Surgical pcr screen     Status: None   Collection Time: 11/12/21  3:37 AM   Specimen: Nasal Mucosa; Nasal Swab  Result Value Ref Range Status   MRSA, PCR NEGATIVE NEGATIVE Final   Staphylococcus aureus NEGATIVE NEGATIVE Final    Comment: (NOTE) The Xpert SA Assay (FDA approved for NASAL specimens in patients 72 years of age and older), is one component of a comprehensive surveillance program. It is not intended to diagnose infection nor to guide or monitor treatment. Performed at [facility], [place] [address], [place], [place] [address]   Resp Panel by RT-PCR (Flu A&B, Covid) Nasopharyngeal Swab     Status: Abnormal   Collection Time: 11/12/21  4:05 AM   Specimen: Nasopharyngeal Swab; Nasopharyngeal(NP) swabs in vial transport medium  Result Value Ref Range Status   SARS Coronavirus 2 by RT PCR POSITIVE (A) NEGATIVE Final    Comment: (NOTE) SARS-CoV-2 target nucleic acids are DETECTED.  The SARS-CoV-2 RNA is generally detectable in upper respiratory specimens during the acute phase of infection. Positive results are indicative of the presence of the identified virus, but do not rule out bacterial infection or  co-infection with other pathogens not detected by the test. Clinical correlation with patient history and other diagnostic information is necessary to determine patient infection status. The expected result is Negative.  Fact Sheet for Patients: EntrepreneurPulse.com.au  Fact Sheet for Healthcare Providers: IncredibleEmployment.be  This test is not yet approved or cleared by the [place] FDA and  has been authorized for detection and/or diagnosis of SARS-CoV-2 by FDA under an Emergency Use Authorization (EUA).  This EUA will remain in effect (meaning this test can be used) for the duration of  the COVID-19 declaration under Section 564(b)(1) of the A ct, 21 U.S.C. section 360bbb-3(b)(1), unless the authorization is terminated or revoked sooner.     Influenza A by PCR NEGATIVE NEGATIVE Final   Influenza B by PCR NEGATIVE NEGATIVE Final    Comment: (NOTE) The Xpert Xpress SARS-CoV-2/FLU/RSV plus assay is intended as an aid in the diagnosis of influenza from Nasopharyngeal swab specimens and should not be used as a sole basis for treatment. Nasal washings and aspirates are unacceptable  for Xpert Xpress SARS-CoV-2/FLU/RSV testing.  Fact Sheet for Patients: EntrepreneurPulse.com.au  Fact Sheet for Healthcare Providers: IncredibleEmployment.be  This test is not yet approved or cleared by the [place] FDA and has been authorized for detection and/or diagnosis of SARS-CoV-2 by FDA under an Emergency Use Authorization (EUA). This EUA will remain in effect (meaning this test can be used) for the duration of the COVID-19 declaration under Section 564(b)(1) of the Act, 21 U.S.C. section 360bbb-3(b)(1), unless the authorization is terminated or revoked.  Performed at [facility], [place] [address] [place], [place] [address]      Studies: CT HEAD WO CONTRAST (5MM)  Result Date:  11/11/2021 CLINICAL DATA:  Altered mentation, unknown cause. EXAM: CT HEAD WITHOUT CONTRAST TECHNIQUE: Contiguous axial images were obtained from the base of the skull through the vertex without intravenous contrast. COMPARISON:  Head CT 07/29/2016. FINDINGS: Brain: There is mild-to-moderate atrophy and atrophic ventriculomegaly with moderate to severe small vessel disease of the cerebral white matter. There are tiny chronic lacunar infarcts again noted in the gangliocapsular areas of both hemispheres and in both thalami. There is mild cerebellar atrophy. There is no asymmetry worrisome for acute infarct, hemorrhage or mass. Dural calcifications are scattered along the falx and tentorium. Vascular: There are calcifications in the distal vertebral arteries and carotid siphons but no hyperdense central vessels. Skull: Normal apart from osteopenia. Negative for fracture or focal lesion. Sinuses/Orbits: There is mild membrane thickening in the ethmoid air cells. Other visible sinuses and mastoid air cells are clear. Old lens replacements are again shown. Other: None. IMPRESSION: No acute intracranial CT findings. There is atrophy with moderate to severe small vessel disease, with some progression in the small vessel disease since the prior study. No other appreciable interval changes. Electronically Signed   By: Telford Nab M.D.   On: 11/11/2021 22:21   DG Chest Port 1 View  Result Date: 11/11/2021 CLINICAL DATA:  Fall EXAM: PORTABLE CHEST 1 VIEW COMPARISON:  12/05/2018 FINDINGS: Rotated AP portable examination. Mild cardiomegaly. No acute abnormality of the lungs. No obvious displaced rib fracture or other abnormality. IMPRESSION: Rotated AP portable examination. Mild cardiomegaly. No acute abnormality of the lungs. No obvious displaced rib fracture or other abnormality. Electronically Signed   By: Delanna Ahmadi M.D.   On: 11/11/2021 11:42   DG Hips Bilat W or Wo Pelvis 3-4 Views  Result Date:  11/11/2021 CLINICAL DATA:  Right hip pain after fall EXAM: DG HIP (WITH OR WITHOUT PELVIS) 3-4V BILAT COMPARISON:  10/04/2019 FINDINGS: Acute intertrochanteric fracture of the right femur with mild foreshortening and varus angulation. Lesser trochanteric fragment is slightly displaced. Hip joint intact without dislocation. No left-sided hip fracture. Diffuse osseous demineralization. Degenerative changes of the bilateral hips and sacroiliac joints. IMPRESSION: Acute mildly displaced and angulated intertrochanteric fracture of the right femur. Electronically Signed   By: Davina Poke D.O.   On: 11/11/2021 11:43      Flora Lipps, MD  [facility] 11/12/2021  If 7PM-7AM, please contact night-coverage

## 2021-11-12 NOTE — Progress Notes (Signed)
Please call son after rounding and for any updates or changes to plan of care at [phone].

## 2021-11-12 NOTE — Plan of Care (Signed)
°  Problem: Clinical Measurements: Goal: Respiratory complications will improve Outcome: Progressing Goal: Cardiovascular complication will be avoided Outcome: Progressing   Problem: Safety: Goal: Ability to remain free from injury will improve Outcome: Not Progressing   Problem: Skin Integrity: Goal: Risk for impaired skin integrity will decrease Outcome: Progressing

## 2021-11-12 NOTE — Progress Notes (Signed)
Pt has refused PO meds, SCDs, pre-op ensure and most care throughout shift. When attempting to cre for pt she has scratched, hit and spit at caregivers. Pt is A&O X2 and confused.

## 2021-11-12 NOTE — Progress Notes (Signed)
Initial Nutrition Assessment  DOCUMENTATION CODES:  Not applicable  INTERVENTION:  Add Ensure Enlive po BID, each supplement provides 350 kcal and 20 grams of protein.  Add MVI with minerals daily.  Encourage PO and supplement intake.  NUTRITION DIAGNOSIS:  Increased nutrient needs related to hip fracture as evidenced by estimated needs.  GOAL:  Patient will meet greater than or equal to 90% of their needs  MONITOR:  PO intake, Supplement acceptance, Labs, Weight trends, I & O's  REASON FOR ASSESSMENT:  Consult Hip fracture protocol  ASSESSMENT:  87 yo female with a PMH of anxiety, brease cancer, carpal tunnel syndrome, cercical spondylosis, chronic pain syndrome, diverticulosis, gastritis, HTN, low back pain, LPRD, and osteoporosis who presents with closed displaced fracture of R femoral neck.  Attempted to speak with pt at bedside. Pt refused to interact with RD and refused exam.   RD to attempt again at follow-up at a later date.  Per Epic, pt has lost ~5 lbs (4.9%) in the past almost 3 months, which is not necessarily significant for the time frame.  Pt likely malnourished to some degree, but RD cannot definitively diagnose at this time.  RD to order Ensure BID and MVI with minerals daily.  Supplements: Ensure Pre-Surgery once (refused)  Medications: reviewed; Pepcid, Lasix every other day, miralax, Senokot BID, Haldol PRN (given once today)  Labs: reviewed; Glucose 125 (H)  NUTRITION - FOCUSED PHYSICAL EXAM: Pt refused - attempt again at follow-up  Diet Order:   Diet Order             Diet Heart Room service appropriate? Yes; Fluid consistency: Thin  Diet effective now                  EDUCATION NEEDS:  Not appropriate for education at this time  Skin:  Skin Assessment: Reviewed RN Assessment  Last BM:  no BM documented  Height:  Ht Readings from Last 1 Encounters:  11/11/21 4\' 10"  (1.473 m)   Weight:  Wt Readings from Last 1 Encounters:   11/11/21 49.9 kg   BMI:  Body mass index is 22.99 kg/m.  Estimated Nutritional Needs:  Kcal:  1400-1600 Protein:  65-80 grams Fluid:  >1.4 L  Derrel Nip, RD, LDN (she/her/hers) Clinical Inpatient Dietitian RD Pager/After-Hours/Weekend Pager # in [place]

## 2021-11-12 NOTE — Progress Notes (Addendum)
Pt refusing to take PO meds. Provider notified pt did not take Carvedilol and lasix. Pt has also pulled telemetry box off and refusing to let writer put back on.

## 2021-11-13 ENCOUNTER — Inpatient Hospital Stay (HOSPITAL_COMMUNITY): Payer: Medicare Other

## 2021-11-13 ENCOUNTER — Emergency Department (HOSPITAL_COMMUNITY): Payer: Medicare Other | Admitting: Anesthesiology

## 2021-11-13 ENCOUNTER — Encounter (HOSPITAL_COMMUNITY): Payer: Self-pay | Admitting: Internal Medicine

## 2021-11-13 ENCOUNTER — Encounter (HOSPITAL_COMMUNITY)
Admission: EM | Disposition: A | Discharge status: Skilled Nursing Facility | Payer: Self-pay | Source: Home / Self Care | Attending: Internal Medicine

## 2021-11-13 DIAGNOSIS — S72001A Fracture of unspecified part of neck of right femur, initial encounter for closed fracture: Secondary | ICD-10-CM | POA: Diagnosis not present

## 2021-11-13 DIAGNOSIS — U071 COVID-19: Secondary | ICD-10-CM | POA: Diagnosis not present

## 2021-11-13 DIAGNOSIS — E559 Vitamin D deficiency, unspecified: Secondary | ICD-10-CM | POA: Diagnosis not present

## 2021-11-13 DIAGNOSIS — F411 Generalized anxiety disorder: Secondary | ICD-10-CM | POA: Diagnosis not present

## 2021-11-13 DIAGNOSIS — N390 Urinary tract infection, site not specified: Secondary | ICD-10-CM | POA: Diagnosis not present

## 2021-11-13 DIAGNOSIS — I4510 Unspecified right bundle-branch block: Secondary | ICD-10-CM | POA: Diagnosis not present

## 2021-11-13 DIAGNOSIS — J302 Other seasonal allergic rhinitis: Secondary | ICD-10-CM | POA: Diagnosis not present

## 2021-11-13 HISTORY — PX: INTRAMEDULLARY (IM) NAIL INTERTROCHANTERIC: SHX5875

## 2021-11-13 SURGERY — FIXATION, FRACTURE, INTERTROCHANTERIC, WITH INTRAMEDULLARY ROD
Anesthesia: Spinal | Laterality: Right

## 2021-11-13 MED ORDER — EPHEDRINE 5 MG/ML INJ
INTRAVENOUS | Status: AC
  Filled 2021-11-13: qty 5

## 2021-11-13 MED ORDER — PHENYLEPHRINE HCL-NACL 20-0.9 MG/250ML-% IV SOLN
INTRAVENOUS | Status: DC | PRN
  Administered 2021-11-13: 50 ug/min via INTRAVENOUS

## 2021-11-13 MED ORDER — ACETAMINOPHEN 325 MG PO TABS
650.0000 mg | ORAL_TABLET | Freq: Four times a day (QID) | ORAL | Status: DC | PRN
  Administered 2021-11-13 – 2021-11-26 (×19): 650 mg via ORAL
  Filled 2021-11-13 (×22): qty 2

## 2021-11-13 MED ORDER — PROPOFOL 10 MG/ML IV BOLUS
INTRAVENOUS | Status: DC | PRN
  Administered 2021-11-13 (×2): 15 mg via INTRAVENOUS

## 2021-11-13 MED ORDER — SODIUM CHLORIDE 0.9 % IR SOLN
Status: DC | PRN
  Administered 2021-11-13: 1000 mL

## 2021-11-13 MED ORDER — ONDANSETRON HCL 4 MG/2ML IJ SOLN
INTRAMUSCULAR | Status: DC | PRN
  Administered 2021-11-13: 4 mg via INTRAVENOUS

## 2021-11-13 MED ORDER — BUPIVACAINE IN DEXTROSE 0.75-8.25 % IT SOLN
INTRATHECAL | Status: DC | PRN
  Administered 2021-11-13: 1.8 mL via INTRATHECAL

## 2021-11-13 MED ORDER — ONDANSETRON HCL 4 MG/2ML IJ SOLN: 4.0000 mg | Freq: Once | INTRAMUSCULAR | Status: DC | PRN

## 2021-11-13 MED ORDER — PHENYLEPHRINE HCL-NACL 20-0.9 MG/250ML-% IV SOLN
INTRAVENOUS | Status: AC
  Filled 2021-11-13: qty 250

## 2021-11-13 MED ORDER — PROPOFOL 500 MG/50ML IV EMUL
INTRAVENOUS | Status: DC | PRN
  Administered 2021-11-13: 50 ug/kg/min via INTRAVENOUS

## 2021-11-13 MED ORDER — LACTATED RINGERS IV SOLN: INTRAVENOUS | Status: DC | PRN

## 2021-11-13 MED ORDER — FENTANYL CITRATE PF 50 MCG/ML IJ SOSY: 25.0000 ug | PREFILLED_SYRINGE | INTRAMUSCULAR | Status: DC | PRN

## 2021-11-13 MED ORDER — CEFAZOLIN SODIUM-DEXTROSE 2-4 GM/100ML-% IV SOLN
2.0000 g | Freq: Once | INTRAVENOUS | Status: AC
  Administered 2021-11-14: 01:00:00 2 g via INTRAVENOUS
  Filled 2021-11-13: qty 100

## 2021-11-13 SURGICAL SUPPLY — 43 items
BAG COUNTER SPONGE SURGICOUNT (BAG) IMPLANT
BAG SPNG CNTER NS LX DISP (BAG)
BIT DRILL 4.3MMS DISTAL GRDTED (BIT) IMPLANT
BNDG GAUZE ELAST 4 BULKY (GAUZE/BANDAGES/DRESSINGS) ×2 IMPLANT
COVER PERINEAL POST (MISCELLANEOUS) ×2 IMPLANT
COVER SURGICAL LIGHT HANDLE (MISCELLANEOUS) ×2 IMPLANT
DRAPE C-ARM 42X120 X-RAY (DRAPES) ×1 IMPLANT
DRAPE C-ARMOR (DRAPES) ×1 IMPLANT
DRAPE STERI IOBAN 125X83 (DRAPES) ×2 IMPLANT
DRESSING MEPILEX FLEX 4X4 (GAUZE/BANDAGES/DRESSINGS) ×2 IMPLANT
DRILL 4.3MMS DISTAL GRADUATED (BIT) ×2
DRSG MEPILEX BORDER 4X8 (GAUZE/BANDAGES/DRESSINGS) ×1 IMPLANT
DRSG MEPILEX FLEX 4X4 (GAUZE/BANDAGES/DRESSINGS) ×6
DURAPREP 26ML APPLICATOR (WOUND CARE) ×2 IMPLANT
ELECT REM PT RETURN 15FT ADLT (MISCELLANEOUS) ×2 IMPLANT
FACESHIELD WRAPAROUND (MASK) ×4 IMPLANT
FACESHIELD WRAPAROUND OR TEAM (MASK) ×1 IMPLANT
GAUZE XEROFORM 5X9 LF (GAUZE/BANDAGES/DRESSINGS) ×1 IMPLANT
GLOVE SRG 8 PF TXTR STRL LF DI (GLOVE) ×1 IMPLANT
GLOVE SURG ENC MOIS LTX SZ6 (GLOVE) ×1 IMPLANT
GLOVE SURG ENC MOIS LTX SZ7.5 (GLOVE) ×2 IMPLANT
GLOVE SURG NEOPR MICRO LF SZ8 (GLOVE) IMPLANT
GLOVE SURG ORTHO LTX SZ8 (GLOVE) ×1 IMPLANT
GLOVE SURG UNDER POLY LF SZ8 (GLOVE) ×2
GOWN STRL REUS W/TWL XL LVL3 (GOWN DISPOSABLE) ×1 IMPLANT
GUIDEPIN VERSANAIL DSP 3.2X444 (ORTHOPEDIC DISPOSABLE SUPPLIES) ×1 IMPLANT
GUIDEWIRE BALL NOSE 80CM (WIRE) ×1 IMPLANT
KIT BASIN OR (CUSTOM PROCEDURE TRAY) ×2 IMPLANT
KIT TURNOVER KIT A (KITS) IMPLANT
MANIFOLD NEPTUNE II (INSTRUMENTS) ×2 IMPLANT
NAIL HIP FRACTURE 11X380MM (Nail) ×1 IMPLANT
NS IRRIG 1000ML POUR BTL (IV SOLUTION) ×2 IMPLANT
PACK GENERAL/GYN (CUSTOM PROCEDURE TRAY) ×2 IMPLANT
PROTECTOR NERVE ULNAR (MISCELLANEOUS) ×2 IMPLANT
SCREW BONE CORTICAL 5.0X42 (Screw) ×1 IMPLANT
SCREW LAG 10.5MMX105MM HFN (Screw) ×1 IMPLANT
SCREWDRIVER HEX TIP 3.5MM (MISCELLANEOUS) ×1 IMPLANT
STAPLER VISISTAT 35W (STAPLE) ×2 IMPLANT
SUT VIC AB 0 CT1 36 (SUTURE) ×1 IMPLANT
SUT VIC AB 1 CT1 36 (SUTURE) ×1 IMPLANT
SUT VIC AB 2-0 CT1 27 (SUTURE)
SUT VIC AB 2-0 CT1 TAPERPNT 27 (SUTURE) ×1 IMPLANT
TOWEL OR 17X26 10 PK STRL BLUE (TOWEL DISPOSABLE) ×2 IMPLANT

## 2021-11-13 NOTE — H&P (View-Only) (Signed)
° °  Subjective:  Sleeping on arrival, awakens but unable to have conversation. Seen by medical team and psychiatry team who agree patient does not have capacity for medical decision making. Spoke with her son [name] who agrees with proceeding to nail fixation.  Objective:   VITALS:   Vitals:   11/12/21 1250 11/12/21 2020 11/13/21 0002 11/13/21 0430  BP: (!) 168/72 (!) 169/62 139/63 (!) 114/59  Pulse: (!) 109 90 88 84  Resp: 20 18 18 16   Temp: 98.7 F (37.1 C) 98.1 F (36.7 C) 99 F (37.2 C) (!) 97.5 F (36.4 C)  TempSrc: Oral Oral Oral Oral  SpO2: 98% 97% 97% 98%  Weight:      Height:        Moves right foot spontaneously but but unable to comply with examination Sitting in bed Waning mental status   Lab Results  Component Value Date   WBC 9.9 11/12/2021   HGB 12.4 11/12/2021   HCT 38.5 11/12/2021   MCV 95.5 11/12/2021   PLT 159 11/12/2021     Assessment/Plan:  -NPO for OR, right hip CMN today   Leily Capek 11/13/2021, 6:28 AM

## 2021-11-13 NOTE — Brief Op Note (Signed)
° °  Brief Op Note  Date of Surgery: 11/13/2021  Preoperative Diagnosis: right femur fracture  Postoperative Diagnosis: same  Procedure: Procedure(s): INTRAMEDULLARY (IM) NAIL INTERTROCHANTRIC  Implants: Implant Name Type Inv. Item Serial No. Manufacturer Lot No. LRB No. Used Action  NAIL HIP FRACTURE [id number] - [id number] Nail NAIL HIP FRACTURE [id number]  [employer](ORTH,TRAU,BIO,SG) [id number] Right 1 Implanted  SCREW LAG 10.5MMX105MM HFN - [id number] Screw SCREW LAG 10.5MMX105MM HFN  [employer](ORTH,TRAU,BIO,SG) [id number] Right 1 Implanted  SCREW BONE CORTICAL 5.0X42 - [id number] Screw SCREW BONE CORTICAL 5.0X42  [employer](ORTH,TRAU,BIO,SG) [id number] Right 1 Implanted    Surgeons: Surgeon(s): Vanetta Mulders, MD  Anesthesia: Choice    Estimated Blood Loss: See anesthesia record  Complications: None  Condition to PACU: Stable  Yevonne Pax, MD 11/13/2021 6:06 PM

## 2021-11-13 NOTE — Anesthesia Procedure Notes (Signed)
Spinal  Patient location during procedure: OR Start time: 11/13/2021 4:50 PM End time: 11/13/2021 4:55 PM Reason for block: surgical anesthesia Staffing Performed: anesthesiologist  Anesthesiologist: Murvin Natal, MD Preanesthetic Checklist Completed: patient identified, IV checked, risks and benefits discussed, surgical consent, monitors and equipment checked, pre-op evaluation and timeout performed Spinal Block Patient position: right lateral decubitus Prep: DuraPrep Patient monitoring: cardiac monitor, continuous pulse ox and blood pressure Approach: midline Location: L4-5 Injection technique: single-shot Needle Needle type: Pencan  Needle gauge: 24 G Needle length: 9 cm Assessment Sensory level: T10 Events: CSF return Additional Notes Functioning IV was confirmed and monitors were applied. Sterile prep and drape, including hand hygiene and sterile gloves were used. The patient was positioned and the spine was prepped. The skin was anesthetized with lidocaine.  Free flow of clear CSF was obtained prior to injecting local anesthetic into the CSF.  The spinal needle aspirated freely following injection.  The needle was carefully withdrawn.  The patient tolerated the procedure well.

## 2021-11-13 NOTE — Progress Notes (Signed)
PROGRESS NOTE  LEANDRA VANDERWEELE XFG:[id number] DOB: [birth year] DOA: 11/11/2021 PCP: Sandrea Hughs, NP   LOS: 2 days   Brief narrative: Heather [name] is a 87 y.o. female with past medical history of anxiety, hypertension, diastolic congestive heart failure, baseline confusion and hypertension, GERD, was brought into the hospital by EMS after sustaining a fall at home after she lost her balance. Patient had a mechanical fall and had baseline is confused and delirious.  Initial vitals were stable.  Patient was then admitted to the hospital for further evaluation and treatment.  Assessment/Plan:  Principal Problem:   Closed displaced fracture of right femoral neck (HCC) Active Problems:   Anxiety state   Primary hypertension   Diastolic CHF ([place])   GERD without esophagitis   Constipation   Restlessness   Closed displaced fracture of right femoral neck  Orthopedics has seen the patient.  Patient does have baseline confusion and agitation and delirium.  Plan for surgical intervention on 11/13/2021.     Restlessness/delirium  unable to make her decisions on the background of delirium, confusional state.  Patient's son is the Marine scientist.  Continue risperidone and Haldol.    Anxiety state Continue risperidone and Haldol.  Hold benzodiazepines.  Essential hypertension On Coreg and oral Lasix.  Blood pressure stable at this time.  Chronic diastolic CHF  Continue Coreg Lasix.  No signs of decompensation.   GERD without esophagitis Continue MiraLAX and stool softeners.  Incidental COVID-positive.   No evidence of hypoxia.  We will hold off with any treatment.  Supportive care.  Abnormal urinalysis with confusion disorientation.  Possibility of UTI. Continue  Rocephin IV.    DVT prophylaxis: SCDs Start: 11/11/21 1525  Code Status: Full code  Family Communication:  Spoke with the patient's son at bedside.  Status is: Inpatient  Remains inpatient appropriate  because:  Hip fracture likely needing surgery,   Consultants: Orthopedics  Procedures: None yet  Anti-infectives:  Rocephin IV  Anti-infectives (From admission, onward)    Start     Dose/Rate Route Frequency Ordered Stop   11/13/21 0600  ceFAZolin (ANCEF) IVPB 2g/100 mL premix        2 g 200 mL/hr over 30 Minutes Intravenous On call to O.R. 11/11/21 1425 11/14/21 0559   11/12/21 1500  cefTRIAXone (ROCEPHIN) 1 g in sodium chloride 0.9 % 100 mL IVPB        1 g 200 mL/hr over 30 Minutes Intravenous Every 24 hours 11/12/21 1210         Subjective: Today, patient was seen and examined at bedside.  Significantly more calm today but patient's son at bedside.  Complains of mild pain to the hip.   Objective: Vitals:   11/13/21 0002 11/13/21 0430  BP: 139/63 (!) 114/59  Pulse: 88 84  Resp: 18 16  Temp: 99 F (37.2 C) (!) 97.5 F (36.4 C)  SpO2: 97% 98%    Intake/Output Summary (Last 24 hours) at 11/13/2021 0827 Last data filed at 11/13/2021 0000 Gross per 24 hour  Intake 337 ml  Output 350 ml  Net -13 ml   Filed Weights   11/11/21 0930  Weight: 49.9 kg   Body mass index is 22.99 kg/m.   Physical Exam:  GENERAL: Patient is alert awake confused disoriented.  Patient be more comfortable  HENT: No scleral pallor or icterus. Pupils equally reactive to light. Oral mucosa is moist NECK: is supple, no gross swelling noted. CHEST: Clear to auscultation. No crackles or wheezes.  Diminished breath sounds bilaterally. CVS: S1 and S2 heard, no murmur. Regular rate and rhythm.  ABDOMEN: Soft, non-tender, bowel sounds are present. EXTREMITIES: Right hip tenderness noted. CNS: Cranial nerves are intact.  Moves all extremities. SKIN: warm and dry without rashes.  Data Review: I have personally reviewed the following laboratory data and studies,  CBC: Recent Labs  Lab 11/11/21 2115 11/12/21 0408  WBC 9.9 9.9  NEUTROABS 7.4  --   HGB 12.7 12.4  HCT 40.0 38.5  MCV 97.3  95.5  PLT 185 834    Basic Metabolic Panel: Recent Labs  Lab 11/11/21 2115 11/12/21 0408  NA 146* 143  K 4.8 4.2  CL 109 107  CO2 30 29  GLUCOSE 124* 125*  BUN 18 18  CREATININE 0.94 0.81  CALCIUM 8.9 8.7*    Liver Function Tests: Recent Labs  Lab 11/11/21 2115 11/12/21 0408  AST 48* 54*  ALT 28 28  ALKPHOS 40 41  BILITOT 1.3* 1.3*  PROT 6.7 6.7  ALBUMIN 3.7 3.4*    No results for input(s): LIPASE, AMYLASE in the last 168 hours. No results for input(s): AMMONIA in the last 168 hours. Cardiac Enzymes: No results for input(s): CKTOTAL, CKMB, CKMBINDEX, TROPONINI in the last 168 hours. BNP (last 3 results) No results for input(s): BNP in the last 8760 hours.  ProBNP (last 3 results) No results for input(s): PROBNP in the last 8760 hours.  CBG: No results for input(s): GLUCAP in the last 168 hours. Recent Results (from the past 240 hour(s))  Surgical pcr screen     Status: None   Collection Time: 11/12/21  3:37 AM   Specimen: Nasal Mucosa; Nasal Swab  Result Value Ref Range Status   MRSA, PCR NEGATIVE NEGATIVE Final   Staphylococcus aureus NEGATIVE NEGATIVE Final    Comment: (NOTE) The Xpert SA Assay (FDA approved for NASAL specimens in patients 39 years of age and older), is one component of a comprehensive surveillance program. It is not intended to diagnose infection nor to guide or monitor treatment. Performed at [facility], [place] [address] [place], [place] [address]   Resp Panel by RT-PCR (Flu A&B, Covid) Nasopharyngeal Swab     Status: Abnormal   Collection Time: 11/12/21  4:05 AM   Specimen: Nasopharyngeal Swab; Nasopharyngeal(NP) swabs in vial transport medium  Result Value Ref Range Status   SARS Coronavirus 2 by RT PCR POSITIVE (A) NEGATIVE Final    Comment: (NOTE) SARS-CoV-2 target nucleic acids are DETECTED.  The SARS-CoV-2 RNA is generally detectable in upper respiratory specimens during the acute phase of infection.  Positive results are indicative of the presence of the identified virus, but do not rule out bacterial infection or co-infection with other pathogens not detected by the test. Clinical correlation with patient history and other diagnostic information is necessary to determine patient infection status. The expected result is Negative.  Fact Sheet for Patients: EntrepreneurPulse.com.au  Fact Sheet for Healthcare Providers: IncredibleEmployment.be  This test is not yet approved or cleared by the [place] FDA and  has been authorized for detection and/or diagnosis of SARS-CoV-2 by FDA under an Emergency Use Authorization (EUA).  This EUA will remain in effect (meaning this test can be used) for the duration of  the COVID-19 declaration under Section 564(b)(1) of the A ct, 21 U.S.C. section 360bbb-3(b)(1), unless the authorization is terminated or revoked sooner.     Influenza A by PCR NEGATIVE NEGATIVE Final   Influenza B by PCR  NEGATIVE NEGATIVE Final    Comment: (NOTE) The Xpert Xpress SARS-CoV-2/FLU/RSV plus assay is intended as an aid in the diagnosis of influenza from Nasopharyngeal swab specimens and should not be used as a sole basis for treatment. Nasal washings and aspirates are unacceptable for Xpert Xpress SARS-CoV-2/FLU/RSV testing.  Fact Sheet for Patients: EntrepreneurPulse.com.au  Fact Sheet for Healthcare Providers: IncredibleEmployment.be  This test is not yet approved or cleared by the [place] FDA and has been authorized for detection and/or diagnosis of SARS-CoV-2 by FDA under an Emergency Use Authorization (EUA). This EUA will remain in effect (meaning this test can be used) for the duration of the COVID-19 declaration under Section 564(b)(1) of the Act, 21 U.S.C. section 360bbb-3(b)(1), unless the authorization is terminated or revoked.  Performed at [facility], [place] [address], [place], [place] [address]       Studies: CT HEAD WO CONTRAST (5MM)  Result Date: 11/11/2021 CLINICAL DATA:  Altered mentation, unknown cause. EXAM: CT HEAD WITHOUT CONTRAST TECHNIQUE: Contiguous axial images were obtained from the base of the skull through the vertex without intravenous contrast. COMPARISON:  Head CT 07/29/2016. FINDINGS: Brain: There is mild-to-moderate atrophy and atrophic ventriculomegaly with moderate to severe small vessel disease of the cerebral white matter. There are tiny chronic lacunar infarcts again noted in the gangliocapsular areas of both hemispheres and in both thalami. There is mild cerebellar atrophy. There is no asymmetry worrisome for acute infarct, hemorrhage or mass. Dural calcifications are scattered along the falx and tentorium. Vascular: There are calcifications in the distal vertebral arteries and carotid siphons but no hyperdense central vessels. Skull: Normal apart from osteopenia. Negative for fracture or focal lesion. Sinuses/Orbits: There is mild membrane thickening in the ethmoid air cells. Other visible sinuses and mastoid air cells are clear. Old lens replacements are again shown. Other: None. IMPRESSION: No acute intracranial CT findings. There is atrophy with moderate to severe small vessel disease, with some progression in the small vessel disease since the prior study. No other appreciable interval changes. Electronically Signed   By: Telford Nab M.D.   On: 11/11/2021 22:21   DG Chest Port 1 View  Result Date: 11/11/2021 CLINICAL DATA:  Fall EXAM: PORTABLE CHEST 1 VIEW COMPARISON:  12/05/2018 FINDINGS: Rotated AP portable examination. Mild cardiomegaly. No acute abnormality of the lungs. No obvious displaced rib fracture or other abnormality. IMPRESSION: Rotated AP portable examination. Mild cardiomegaly. No acute abnormality of the lungs. No obvious displaced rib fracture or other abnormality. Electronically Signed    By: Delanna Ahmadi M.D.   On: 11/11/2021 11:42   DG Hips Bilat W or Wo Pelvis 3-4 Views  Result Date: 11/11/2021 CLINICAL DATA:  Right hip pain after fall EXAM: DG HIP (WITH OR WITHOUT PELVIS) 3-4V BILAT COMPARISON:  10/04/2019 FINDINGS: Acute intertrochanteric fracture of the right femur with mild foreshortening and varus angulation. Lesser trochanteric fragment is slightly displaced. Hip joint intact without dislocation. No left-sided hip fracture. Diffuse osseous demineralization. Degenerative changes of the bilateral hips and sacroiliac joints. IMPRESSION: Acute mildly displaced and angulated intertrochanteric fracture of the right femur. Electronically Signed   By: Davina Poke D.O.   On: 11/11/2021 11:43      Flora Lipps, MD  [facility] 11/13/2021  If 7PM-7AM, please contact night-coverage

## 2021-11-13 NOTE — Anesthesia Preprocedure Evaluation (Addendum)
Anesthesia Evaluation  Patient identified by MRN, date of birth, ID band  Reviewed: Allergy & Precautions, NPO status , Patient's Chart, lab work & pertinent test results  Airway Mallampati: II       Dental no notable dental hx.    Pulmonary    Pulmonary exam normal        Cardiovascular hypertension, Pt. on home beta blockers +CHF  Normal cardiovascular exam     Neuro/Psych PSYCHIATRIC DISORDERS Anxiety Dementia    GI/Hepatic Neg liver ROS, hiatal hernia, GERD  Controlled and Medicated,  Endo/Other  negative endocrine ROS  Renal/GU Renal disease     Musculoskeletal  (+) Arthritis ,   Abdominal   Peds  Hematology negative hematology ROS (+)   Anesthesia Other Findings right femur fracture  Reproductive/Obstetrics                           Anesthesia Physical Anesthesia Plan  ASA: 4  Anesthesia Plan: Spinal   Post-op Pain Management:    Induction: Intravenous  PONV Risk Score and Plan: 2 and Ondansetron, Dexamethasone, Treatment may vary due to age or medical condition and Propofol infusion  Airway Management Planned: Simple Face Mask  Additional Equipment:   Intra-op Plan:   Post-operative Plan:   Informed Consent: I have reviewed the patients History and Physical, chart, labs and discussed the procedure including the risks, benefits and alternatives for the proposed anesthesia with the patient or authorized representative who has indicated his/her understanding and acceptance.     Consent reviewed with POA  Plan Discussed with: CRNA and Surgeon  Anesthesia Plan Comments: (Anesthetic plan discussed with son via telephone)      Anesthesia Quick Evaluation

## 2021-11-13 NOTE — Progress Notes (Signed)
PHARMACY NOTE:  ANTIMICROBIAL RENAL DOSAGE ADJUSTMENT  Current antimicrobial regimen includes a mismatch between antimicrobial dosage and estimated renal function.  As per policy approved by the Pharmacy & Therapeutics and Medical Executive Committees, the antimicrobial dosage will be adjusted accordingly.  Current antimicrobial dosage:  Ancef 2gm IV q8h x2 doses  **Also on Rocephin q24h  Indication: post-op prophylaxis  Renal Function:  Estimated Creatinine Clearance: 25.3 mL/min (by C-G formula based on SCr of 0.81 mg/dL). []      On intermittent HD, scheduled: []      On CRRT    Antimicrobial dosage has been changed to:  Ancef x1 dose post op  Additional comments:   Thank you for allowing pharmacy to be a part of this patient's care.  Netta Cedars, [facility] 11/13/2021 7:06 PM

## 2021-11-13 NOTE — Interval H&P Note (Signed)
History and Physical Interval Note:  11/13/2021 3:46 PM  Heather [name]  has presented today for surgery, with the diagnosis of right femur fracture.  The various methods of treatment have been discussed with the patient and family. After consideration of risks, benefits and other options for treatment, the patient has consented to  Procedure(s): INTRAMEDULLARY (IM) NAIL INTERTROCHANTRIC (Right) as a surgical intervention.  The patient's history has been reviewed, patient examined, no change in status, stable for surgery.  I have reviewed the patient's chart and labs.  Questions were answered to the patient's satisfaction.     Vanetta Mulders

## 2021-11-13 NOTE — Op Note (Signed)
Date of Surgery: 11/13/2021  INDICATIONS: Ms. Dolin is a 87 y.o.-year-old female with right intertrochanteric hip fracture after a fall at home.  The risk and benefits of the procedure with discussed in detail and documented in the pre-operative evaluation.  PREOPERATIVE DIAGNOSIS: 1. Right intertrochanteric hip fracture  POSTOPERATIVE DIAGNOSIS: Same.  PROCEDURE: 1. Right cephulomedullary nail  SURGEON: Yevonne Pax MD  ASSISTANT: Raynelle Fanning, ATC; necessary for the timely completion of procedure and due to complexity of procedure.  ANESTHESIA:  epidural  IV FLUIDS AND URINE: See anesthesia record.  ANTIBIOTICS: Ancef 2g  ESTIMATED BLOOD LOSS: 100 mL.  IMPLANTS:  Implant Name Type Inv. Item Serial No. Manufacturer Lot No. LRB No. Used Action  NAIL HIP FRACTURE [id number] - [id number] Nail NAIL HIP FRACTURE [id number]  [employer](ORTH,TRAU,BIO,SG) [id number] Right 1 Implanted  SCREW LAG 10.5MMX105MM HFN - [id number] Screw SCREW LAG 10.5MMX105MM HFN  [employer](ORTH,TRAU,BIO,SG) [id number] Right 1 Implanted  SCREW BONE CORTICAL 5.0X42 - [id number] Screw SCREW BONE CORTICAL 5.0X42  [employer]IM[employer]ON(ORTH,TRAU,BIO,SG) [id number] Right 1 Implanted    DRAINS: None  CULTURES: None  COMPLICATIONS: none  DESCRIPTION OF PROCEDURE:  The patient was identified in the preoperative holding area.  The correct site was marked according to universal protocol with nursing.  She is subsequently taken back to the operating room.  Epidural was placed by anesthesia.  She was prepped and draped in usual sterile fashion.  The Hana table was used.  Traction internal rotation ultimately was performed in order to get a near-anatomic reduction.  Final timeout was again held.  A incision was made 3 cm proximal and posterior to the greater trochanter.  Skin was incised sharply with a 10 blade.  IT band was incised sharply.  The greater trochanter was palpated and the guidewire was placed in the central aspect of  the trochanter which was confirmed on AP and lateral fluoroscopy.  The opening reamer was utilized.  A ball-tipped guidewire was then placed.  This was confirmed on AP and lateral fluoroscopy.  A size 13 reamer was used.  The length of the nail was measured off of the ball-tipped wire and an 11 x 380 mm nail was selected.  This was then placed.  A bone hook was then used to help reduce the fracture.  This was placed percutaneously over the anterior aspect of the femur with care to stay directly on bone.  The pin was placed for the neck component of the nail.  This again was confirmed to be center center on AP lateral fluoroscopy.  We drilled to 105 mm and 105 mm screw was selected.  This was placed.  Compression was performed in order to compress across the fracture site.  The nail was locked and then backed off a quarter turn in order to allow for compression.  At this time attention was turned to the distal interlocks.  These were obtained using fluoroscopy.  15 blade was used to incise sharply through skin.  IT band was incised sharply.  Drill was then used over the interlock.  This was measured and the screw was subsequently placed in the oblong hole.  Final AP and lateral fluoroscopy's were obtained.  The wounds were thoroughly irrigated and closed in layers of 0 Vicryl, 2-0 Vicryl, staples.  Mepilex dressings were placed.  All counts were correct at the end of the case.  The patient was awoken and taken to PACU without complication   Raynelle Fanning ATC was necessary for opening, closing, retracting,  limb positioning and overall facilitation and timely completion of the procedure.     POSTOPERATIVE PLAN: She will be weightbearing as tolerated on the right lower extremity.  She will be seen by physical therapy and mobilize as tolerated.  She will see me back in 2 weeks for staple removal  Yevonne Pax, MD 6:06 PM

## 2021-11-13 NOTE — Transfer of Care (Signed)
Immediate Anesthesia Transfer of Care Note  Patient: Heather [name]  Procedure(s) Performed: INTRAMEDULLARY (IM) NAIL INTERTROCHANTRIC (Right)  Patient Location: PACU  Anesthesia Type:Spinal  Level of Consciousness: drowsy  Airway & Oxygen Therapy: Patient Spontanous Breathing and Patient connected to face mask  Post-op Assessment: Report given to RN and Post -op Vital signs reviewed and stable  Post vital signs: Reviewed and stable  Last Vitals:  Vitals Value Taken Time  BP 113/66 11/13/21 1815  Temp    Pulse 71 11/13/21 1822  Resp 36 11/13/21 1822  SpO2 100 % 11/13/21 1822  Vitals shown include unvalidated device data.  Last Pain:  Vitals:   11/13/21 0937  TempSrc:   PainSc: 0-No pain         Complications: No notable events documented.

## 2021-11-13 NOTE — Progress Notes (Addendum)
° °  Subjective:  Sleeping on arrival, awakens but unable to have conversation. Seen by medical team and psychiatry team who agree patient does not have capacity for medical decision making. Spoke with her son [name] who agrees with proceeding to nail fixation.  Objective:   VITALS:   Vitals:   11/12/21 1250 11/12/21 2020 11/13/21 0002 11/13/21 0430  BP: (!) 168/72 (!) 169/62 139/63 (!) 114/59  Pulse: (!) 109 90 88 84  Resp: 20 18 18 16   Temp: 98.7 F (37.1 C) 98.1 F (36.7 C) 99 F (37.2 C) (!) 97.5 F (36.4 C)  TempSrc: Oral Oral Oral Oral  SpO2: 98% 97% 97% 98%  Weight:      Height:        Moves right foot spontaneously but but unable to comply with examination Sitting in bed Waning mental status   Lab Results  Component Value Date   WBC 9.9 11/12/2021   HGB 12.4 11/12/2021   HCT 38.5 11/12/2021   MCV 95.5 11/12/2021   PLT 159 11/12/2021     Assessment/Plan:  -NPO for OR, right hip CMN today   Delphia Kaylor 11/13/2021, 6:28 AM

## 2021-11-14 ENCOUNTER — Encounter (HOSPITAL_COMMUNITY): Payer: Self-pay | Admitting: Orthopaedic Surgery

## 2021-11-14 DIAGNOSIS — S72001A Fracture of unspecified part of neck of right femur, initial encounter for closed fracture: Secondary | ICD-10-CM | POA: Diagnosis not present

## 2021-11-14 DIAGNOSIS — U071 COVID-19: Secondary | ICD-10-CM | POA: Diagnosis not present

## 2021-11-14 DIAGNOSIS — N390 Urinary tract infection, site not specified: Secondary | ICD-10-CM | POA: Diagnosis not present

## 2021-11-14 DIAGNOSIS — F411 Generalized anxiety disorder: Secondary | ICD-10-CM | POA: Diagnosis not present

## 2021-11-14 LAB — BASIC METABOLIC PANEL
Anion gap: 10 (ref 5–15)
BUN: 25 mg/dL — ABNORMAL HIGH (ref 8–23)
CO2: 27 mmol/L (ref 22–32)
Calcium: 8.2 mg/dL — ABNORMAL LOW (ref 8.9–10.3)
Chloride: 107 mmol/L (ref 98–111)
Creatinine, Ser: 0.83 mg/dL (ref 0.44–1.00)
GFR, Estimated: >60 mL/min (ref >60)
Glucose, Bld: 132 mg/dL — ABNORMAL HIGH (ref 70–99)
Potassium: 3.8 mmol/L (ref 3.5–5.1)
Sodium: 144 mmol/L (ref 135–145)

## 2021-11-14 LAB — CBC
HCT: 30.3 % — ABNORMAL LOW (ref 36.0–46.0)
Hemoglobin: 9.4 g/dL — ABNORMAL LOW (ref 12.0–15.0)
MCH: 30.7 pg (ref 26.0–34.0)
MCHC: 31 g/dL (ref 30.0–36.0)
MCV: 99 fL (ref 80.0–100.0)
Platelets: 124 10*3/uL — ABNORMAL LOW (ref 150–400)
RBC: 3.06 MIL/uL — ABNORMAL LOW (ref 3.87–5.11)
RDW: 14.2 % (ref 11.5–15.5)
WBC: 8.5 10*3/uL (ref 4.0–10.5)
nRBC: 0 % (ref 0.0–0.2)

## 2021-11-14 LAB — MAGNESIUM: Magnesium: 2.4 mg/dL (ref 1.7–2.4)

## 2021-11-14 MED ORDER — ENOXAPARIN SODIUM 30 MG/0.3ML IJ SOSY
30.0000 mg | PREFILLED_SYRINGE | INTRAMUSCULAR | Status: DC
  Administered 2021-11-15 – 2021-11-26 (×12): 30 mg via SUBCUTANEOUS
  Filled 2021-11-14 (×13): qty 0.3

## 2021-11-14 MED ORDER — SODIUM CHLORIDE 0.9 % IV BOLUS
250.0000 mL | Freq: Once | INTRAVENOUS | Status: AC
  Administered 2021-11-14: 11:00:00 250 mL via INTRAVENOUS

## 2021-11-14 NOTE — Progress Notes (Signed)
Patient arrives from 4th floor on bed w/ son at bedside. Oriented to room. Patient refusing vitals, keeps repeating "Why are you trying to kill me?" Patient does not appear to be in any physical distress. Will cont to monitor and address any needs.

## 2021-11-14 NOTE — Progress Notes (Signed)
° °  Subjective:  Patient reports pain as mild.Sleeping comfortably on arrival. Awake but back to sleep.  Objective:   VITALS:   Vitals:   11/13/21 1901 11/13/21 2251 11/14/21 0257 11/14/21 0546  BP: 107/67 (!) 91/53 101/64 108/62  Pulse: 72 76 75 80  Resp: 20 16 17 17   Temp: 97.8 F (36.6 C) 99.4 F (37.4 C) 98.9 F (37.2 C) 98.6 F (37 C)  TempSrc:  Oral Oral Oral  SpO2: 93% 92% 94% 100%  Weight:      Height:        Neurologically intact Neurovascular intact Sensation intact distally Intact pulses distally   Lab Results  Component Value Date   WBC 8.5 11/14/2021   HGB 9.4 (L) 11/14/2021   HCT 30.3 (L) 11/14/2021   MCV 99.0 11/14/2021   PLT 124 (L) 11/14/2021     Assessment/Plan:  1 Day Post-Op s/p Right hip CMN for IT fracture  - Expected postop acute blood loss anemia - will monitor for symptoms - Patient to work with PT/OT to optimize mobilization safely - DVT ppx - SCDs, ambulation, recommend Lovenox if able - Postoperative Abx: Ancef x 2 additional doses given - WBAT operative extremity - Appreciate care per medical team   [facility] 11/14/2021, 7:45 AM

## 2021-11-14 NOTE — Evaluation (Signed)
Physical Therapy Evaluation Patient Details Name: Heather [name] MRN: [id number] DOB: 07-Oct-1918 Today's Date: 11/14/2021  History of Present Illness  87 y.o. female with past medical history of anxiety, hypertension, diastolic congestive heart failure, GERD, was brought into the hospital by EMS after sustaining a fall at home after she lost her balance patient had a mechanical fall and at  baseline is confused and delirious. Dx of R femoral neck fx, s/p IM nail 11/13/21, Covid positive.  Clinical Impression  Pt admitted with above diagnosis. Pt is fidgety and distracted, she was unable to follow commands nor answer orientation questions. +2 total assist for bed to recliner transfer. SNF recommended.  Pt currently with functional limitations due to the deficits listed below (see PT Problem List). Pt will benefit from skilled PT to increase their independence and safety with mobility to allow discharge to the venue listed below.          Recommendations for follow up therapy are one component of a multi-disciplinary discharge planning process, led by the attending physician.  Recommendations may be updated based on patient status, additional functional criteria and insurance authorization.  Follow Up Recommendations Skilled nursing-short term rehab (<3 hours/day)    Assistance Recommended at Discharge Frequent or constant Supervision/Assistance  Functional Status Assessment Patient has had a recent decline in their functional status and demonstrates the ability to make significant improvements in function in a reasonable and predictable amount of time.  Equipment Recommendations  None recommended by PT    Recommendations for Other Services       Precautions / Restrictions Precautions Precautions: Fall Restrictions Weight Bearing Restrictions: No RLE Weight Bearing: Weight bearing as tolerated      Mobility  Bed Mobility Overal bed mobility: Needs Assistance Bed Mobility: Supine  to Sit     Supine to sit: +2 for physical assistance;+2 for safety/equipment;Total assist     General bed mobility comments: unclear what her baseline is 2* confusion and no family present, unable to follow commands    Transfers Overall transfer level: Needs assistance Equipment used: 2 person hand held assist Transfers: Bed to chair/wheelchair/BSC;Sit to/from Stand Sit to Stand: Max assist;+2 safety/equipment;+2 physical assistance Stand pivot transfers: +2 safety/equipment;+2 physical assistance;Total assist         General transfer comment: pt able to bear some weight thru LLE, confusion limits meaningful participation    Ambulation/Gait               General Gait Details: unable  Stairs            Wheelchair Mobility    Modified Rankin (Stroke Patients Only)       Balance Overall balance assessment: Needs assistance;History of Falls Sitting-balance support: Feet supported;No upper extremity supported Sitting balance-Leahy Scale: Fair     Standing balance support: Bilateral upper extremity supported Standing balance-Leahy Scale: Poor                               Pertinent Vitals/Pain Pain Assessment: Faces Faces Pain Scale: Hurts even more Pain Location: R hip with movement Pain Descriptors / Indicators: Grimacing;Guarding Pain Intervention(s): Limited activity within patient's tolerance;Monitored during session;Repositioned;Premedicated before session    Home Living Family/patient expects to be discharged to:: Skilled nursing facility                        Prior Function Prior Level of Function : Patient poor historian/Family  not available                     Hand Dominance        Extremity/Trunk Assessment   Upper Extremity Assessment Upper Extremity Assessment: Overall WFL for tasks assessed    Lower Extremity Assessment Lower Extremity Assessment: RLE deficits/detail;Difficult to assess due to  impaired cognition RLE Deficits / Details: unable to follow commands, painful with movement RLE: Unable to fully assess due to pain    Cervical / Trunk Assessment Cervical / Trunk Assessment: Normal  Communication      Cognition Arousal/Alertness: Awake/alert Behavior During Therapy: Restless;Anxious Overall Cognitive Status: No family/caregiver present to determine baseline cognitive functioning                                 General Comments: per chart, pt is confused at baseline, she was unable to follow commands, was very distracted and not able to focus on task at hand, not able to answer orientation questions        General Comments      Exercises General Exercises - Lower Extremity Ankle Circles/Pumps: AAROM;Both;10 reps;Supine   Assessment/Plan    PT Assessment Patient needs continued PT services  PT Problem List Decreased strength;Decreased mobility;Decreased balance;Decreased activity tolerance;Decreased cognition       PT Treatment Interventions Therapeutic activities;Therapeutic exercise;Gait training;Functional mobility training;Patient/family education;DME instruction    PT Goals (Current goals can be found in the Care Plan section)  Acute Rehab PT Goals PT Goal Formulation: Patient unable to participate in goal setting Time For Goal Achievement: 11/28/21 Potential to Achieve Goals: Fair    Frequency Min 3X/week   Barriers to discharge        Co-evaluation               AM-PAC PT "6 Clicks" Mobility  Outcome Measure Help needed turning from your back to your side while in a flat bed without using bedrails?: A Lot Help needed moving from lying on your back to sitting on the side of a flat bed without using bedrails?: Total Help needed moving to and from a bed to a chair (including a wheelchair)?: Total Help needed standing up from a chair using your arms (e.g., wheelchair or bedside chair)?: Total Help needed to walk in hospital  room?: Total Help needed climbing 3-5 steps with a railing? : Total 6 Click Score: 7    End of Session Equipment Utilized During Treatment: Gait belt Activity Tolerance: Patient limited by pain Patient left: in chair;with call bell/phone within reach;with chair alarm set Nurse Communication: Mobility status PT Visit Diagnosis: Difficulty in walking, not elsewhere classified (R26.2);Pain;History of falling (Z91.81) Pain - Right/Left: Right Pain - part of body: Hip    Time: 3810-1751 PT Time Calculation (min) (ACUTE ONLY): 16 min   Charges:   PT Evaluation $PT Eval Moderate Complexity: 1 Mod         Philomena Doheny PT 11/14/2021  Acute Rehabilitation Services Pager [phone] Office [phone]

## 2021-11-14 NOTE — Progress Notes (Signed)
PROGRESS NOTE  Heather [name]:[id number] DOB: December 26, 1917 DOA: 11/11/2021 PCP: Sandrea Hughs, NP   LOS: 3 days   Brief narrative:  Heather [name] is a 87 y.o. female with past medical history of anxiety, hypertension, diastolic congestive heart failure, baseline confusion and hypertension, GERD, was brought into the hospital by EMS after sustaining a fall at home after she lost her balance. Patient had a mechanical fall and was noted to have a displaced fracture of the right femoral neck.  Initial vitals were stable.  Patient was then admitted to the hospital for further evaluation and treatment.  Assessment/Plan:  Principal Problem:   Closed displaced fracture of right femoral neck (HCC) Active Problems:   Anxiety state   Primary hypertension   Diastolic CHF ([place])   GERD without esophagitis   Constipation   Restlessness   Closed right intertrochanteric hip fracture. Patient underwent right cephalomedullary nailing on 11/13/2021.  Depending on seen the patient will initiate Lovenox.  Physical therapy has been consulted who recommended skilled nursing facility placement for rehabilitation.  Restlessness confusional state. At baseline patient is confused.  Patient's son is the Marine scientist.  Continue risperidone and Haldol.    Anxiety state Continue risperidone and Haldol.  Try to avoid benzodiazepines.  Essential hypertension On Coreg and oral Lasix.  Patient was mildly hypotensive and will give 250 ml of IV fluid bolus today.  Chronic diastolic CHF  Continue Coreg Lasix.  Compensated at this time   GERD without esophagitis Continue MiraLAX and stool softeners.  Incidental COVID-positive.   No evidence of hypoxia.  We will hold off with any treatment.  Supportive care.  Abnormal urinalysis with confusion disorientation.  Possibility of UTI. Continue  Rocephin IV to complete 3-day course..    DVT prophylaxis: enoxaparin (LOVENOX) injection 30 mg Start:  11/14/21 1000 SCDs Start: 11/11/21 1525  Code Status: Full code  Family Communication:  I spoke with the patient's son at bedside on 11/13/2021  Status is: Inpatient  Remains inpatient appropriate because:  Hip fracture status postsurgical intervention, need for rehabilitation,  Consultants: Orthopedics  Procedures: Right hip cephalomedullary nailing on 11/13/2021  Anti-infectives:  Rocephin IV 12/27>  Anti-infectives (From admission, onward)    Start     Dose/Rate Route Frequency Ordered Stop   11/14/21 0000  ceFAZolin (ANCEF) IVPB 2g/100 mL premix        2 g 200 mL/hr over 30 Minutes Intravenous  Once 11/13/21 1857 11/14/21 0130   11/13/21 0600  ceFAZolin (ANCEF) IVPB 2g/100 mL premix        2 g 200 mL/hr over 30 Minutes Intravenous On call to O.R. 11/11/21 1425 11/13/21 1653   11/12/21 1500  cefTRIAXone (ROCEPHIN) 1 g in sodium chloride 0.9 % 100 mL IVPB        1 g 200 mL/hr over 30 Minutes Intravenous Every 24 hours 11/12/21 1210        Subjective: Today, she was seen and examined at bedside.  Patient is more alert awake and communicative today.  Denies overt pain, nausea, vomiting, fever or chills.  Objective: Vitals:   11/14/21 0257 11/14/21 0546  BP: 101/64 108/62  Pulse: 75 80  Resp: 17 17  Temp: 98.9 F (37.2 C) 98.6 F (37 C)  SpO2: 94% 100%    Intake/Output Summary (Last 24 hours) at 11/14/2021 0832 Last data filed at 11/14/2021 0800 Gross per 24 hour  Intake 1140 ml  Output 750 ml  Net 390 ml    [employer]  11/11/21 0930  Weight: 49.9 kg   Body mass index is 22.99 kg/m.   Physical Exam:  GENERAL: Patient is alert awake and communicative, oriented to place, appears more comfortable. HENT: No scleral pallor or icterus. Pupils equally reactive to light. Oral mucosa is moist NECK: is supple, no gross swelling noted. CHEST: Clear to auscultation. No crackles or wheezes.  Diminished breath sounds bilaterally. CVS: S1 and S2 heard, no  murmur. Regular rate and rhythm.  ABDOMEN: Soft, non-tender, bowel sounds are present. EXTREMITIES: Right hip surgical site healthy. CNS: Cranial nerves are intact.  Moves all extremities. SKIN: warm and dry without rashes.  Data Review: I have personally reviewed the following laboratory data and studies,  CBC: Recent Labs  Lab 11/11/21 2115 11/12/21 0408 11/14/21 0359  WBC 9.9 9.9 8.5  NEUTROABS 7.4  --   --   HGB 12.7 12.4 9.4*  HCT 40.0 38.5 30.3*  MCV 97.3 95.5 99.0  PLT 185 159 124*    Basic Metabolic Panel: Recent Labs  Lab 11/11/21 2115 11/12/21 0408 11/14/21 0359  NA 146* 143 144  K 4.8 4.2 3.8  CL 109 107 107  CO2 30 29 27   GLUCOSE 124* 125* 132*  BUN 18 18 25*  CREATININE 0.94 0.81 0.83  CALCIUM 8.9 8.7* 8.2*  MG  --   --  2.4    Liver Function Tests: Recent Labs  Lab 11/11/21 2115 11/12/21 0408  AST 48* 54*  ALT 28 28  ALKPHOS 40 41  BILITOT 1.3* 1.3*  PROT 6.7 6.7  ALBUMIN 3.7 3.4*    No results for input(s): LIPASE, AMYLASE in the last 168 hours. No results for input(s): AMMONIA in the last 168 hours. Cardiac Enzymes: No results for input(s): CKTOTAL, CKMB, CKMBINDEX, TROPONINI in the last 168 hours. BNP (last 3 results) No results for input(s): BNP in the last 8760 hours.  ProBNP (last 3 results) No results for input(s): PROBNP in the last 8760 hours.  CBG: No results for input(s): GLUCAP in the last 168 hours. Recent Results (from the past 240 hour(s))  Surgical pcr screen     Status: None   Collection Time: 11/12/21  3:37 AM   Specimen: Nasal Mucosa; Nasal Swab  Result Value Ref Range Status   MRSA, PCR NEGATIVE NEGATIVE Final   Staphylococcus aureus NEGATIVE NEGATIVE Final    Comment: (NOTE) The Xpert SA Assay (FDA approved for NASAL specimens in patients 48 years of age and older), is one component of a comprehensive surveillance program. It is not intended to diagnose infection nor to guide or monitor treatment. Performed  at [facility], [place] [address], [place], [place] [address]   Resp Panel by RT-PCR (Flu A&B, Covid) Nasopharyngeal Swab     Status: Abnormal   Collection Time: 11/12/21  4:05 AM   Specimen: Nasopharyngeal Swab; Nasopharyngeal(NP) swabs in vial transport medium  Result Value Ref Range Status   SARS Coronavirus 2 by RT PCR POSITIVE (A) NEGATIVE Final    Comment: (NOTE) SARS-CoV-2 target nucleic acids are DETECTED.  The SARS-CoV-2 RNA is generally detectable in upper respiratory specimens during the acute phase of infection. Positive results are indicative of the presence of the identified virus, but do not rule out bacterial infection or co-infection with other pathogens not detected by the test. Clinical correlation with patient history and other diagnostic information is necessary to determine patient infection status. The expected result is Negative.  Fact Sheet for Patients: EntrepreneurPulse.com.au  Fact Sheet for  Healthcare Providers: IncredibleEmployment.be  This test is not yet approved or cleared by the [place] and  has been authorized for detection and/or diagnosis of SARS-CoV-2 by FDA under an Emergency Use Authorization (EUA).  This EUA will remain in effect (meaning this test can be used) for the duration of  the COVID-19 declaration under Section 564(b)(1) of the A ct, 21 U.S.C. section 360bbb-3(b)(1), unless the authorization is terminated or revoked sooner.     Influenza A by PCR NEGATIVE NEGATIVE Final   Influenza B by PCR NEGATIVE NEGATIVE Final    Comment: (NOTE) The Xpert Xpress SARS-CoV-2/FLU/RSV plus assay is intended as an aid in the diagnosis of influenza from Nasopharyngeal swab specimens and should not be used as a sole basis for treatment. Nasal washings and aspirates are unacceptable for Xpert Xpress SARS-CoV-2/FLU/RSV testing.  Fact Sheet for  Patients: EntrepreneurPulse.com.au  Fact Sheet for Healthcare Providers: IncredibleEmployment.be  This test is not yet approved or cleared by the [place] FDA and has been authorized for detection and/or diagnosis of SARS-CoV-2 by FDA under an Emergency Use Authorization (EUA). This EUA will remain in effect (meaning this test can be used) for the duration of the COVID-19 declaration under Section 564(b)(1) of the Act, 21 U.S.C. section 360bbb-3(b)(1), unless the authorization is terminated or revoked.  Performed at [facility], [place] [address] [place], [place] [address]       Studies: DG C-Arm 1-60 Min-No Report  Result Date: 11/13/2021 Fluoroscopy was utilized by the requesting physician.  No radiographic interpretation.   DG HIP OPERATIVE UNILAT W OR W/O PELVIS RIGHT  Result Date: 11/13/2021 CLINICAL DATA:  Intraoperative for intramedullary nail EXAM: OPERATIVE right HIP (WITH PELVIS IF PERFORMED) 4 VIEWS TECHNIQUE: Fluoroscopic spot image(s) were submitted for interpretation post-operatively. COMPARISON:  11/11/2021 FINDINGS: Intraoperative fluoroscopy is utilized for surgical control purposes. Fluoroscopy time is indicated at 1 minute 12 seconds. Dose is 9.3580 mGy. Four spot fluoroscopic images are obtained. Spot fluoroscopic images obtained demonstrate placement of a long intramedullary rod with compression bolt fixation of inter trochanteric fracture of the proximal right femur. A single distal locking screw is identified in the distal femoral metaphysis. Mild residual displacement of the fracture fragments with near anatomic alignment suggested. IMPRESSION: Intraoperative fluoroscopy obtained during surgical control purposes demonstrating internal fixation of an inter trochanteric fracture of the right proximal femur. Electronically Signed   By: Lucienne Capers M.D.   On: 11/13/2021 19:31      Flora Lipps,  MD  [facility] 11/14/2021  If 7PM-7AM, please contact night-coverage

## 2021-11-14 NOTE — Discharge Instructions (Signed)
° ° °   Discharge Instructions    Attending Surgeon: Vanetta Mulders, MD Office Phone Number: [phone]   Diagnosis and Procedures:    Surgeries Performed: Right hip nailing  Discharge Plan:    Diet: Resume usual diet. Begin with light or bland foods.  Drink plenty of fluids.  Activity:  Weight bear as tolerated right leg  WOUND INSTRUCTIONS:   Leave your dressing  in place until your post operative visit.  Keep it clean and dry.  Always keep the incision clean and dry until the staples/sutures are removed. If there is no drainage from the incision you should keep it open to air. If there is drainage from the incision you must keep it covered at all times until the drainage stops  Do not soak in a bath tub, hot tub, pool, lake or other body of water until 21 days after your surgery and your incision is completely dry and healed.  If you have removable sutures (or staples) they must be removed 10-14 days (unless otherwise instructed) from the day of your surgery.     1)  Elevate the extremity as much as possible.  2)  Keep the dressing clean and dry.  3)  Please call us if the dressing becomes wet or dirty.  4)  If you are experiencing worsening pain or worsening swelling, please call. =      FOLLOWUP INSTRUCTIONS: 1. Follow up at the Physical Therapy Clinic 3-4 days following surgery. This appointment should be scheduled unless other arrangements have been made.The Physical Therapy scheduling number is [phone] if an appointment has not already been arranged.  2. Contact Dr. Eddie Dibbles office during office hours at [phone] or the practice after hours line at [phone] for non-emergencies. For medical emergencies call 911.

## 2021-11-15 DIAGNOSIS — F411 Generalized anxiety disorder: Secondary | ICD-10-CM | POA: Diagnosis not present

## 2021-11-15 DIAGNOSIS — N390 Urinary tract infection, site not specified: Secondary | ICD-10-CM | POA: Diagnosis not present

## 2021-11-15 DIAGNOSIS — U071 COVID-19: Secondary | ICD-10-CM | POA: Diagnosis not present

## 2021-11-15 DIAGNOSIS — S72001A Fracture of unspecified part of neck of right femur, initial encounter for closed fracture: Secondary | ICD-10-CM | POA: Diagnosis not present

## 2021-11-15 NOTE — Progress Notes (Signed)
° °  Subjective:  Having some confusion the night before, again combative towards staff. Pain appears controlled on inspection.  Objective:   VITALS:   Vitals:   11/14/21 1034 11/14/21 1226 11/14/21 2134 11/15/21 0515  BP: (!) 83/48 (!) 129/112 108/61 116/66  Pulse: 81  89 87  Resp: 18  17 17   Temp: 98 F (36.7 C)  98.4 F (36.9 C) 98.2 F (36.8 C)  TempSrc: Oral  Oral Oral  SpO2: 100%  98% 100%  Weight:      Height:        Neurologically intact Neurovascular intact Sensation intact distally Intact pulses distally   Lab Results  Component Value Date   WBC 8.5 11/14/2021   HGB 9.4 (L) 11/14/2021   HCT 30.3 (L) 11/14/2021   MCV 99.0 11/14/2021   PLT 124 (L) 11/14/2021     Assessment/Plan:  2 Days Post-Op s/p Right hip CMN for IT fracture  - Expected postop acute blood loss anemia - will monitor for symptoms - Patient to work with PT/OT to optimize mobilization safely - DVT ppx - SCDs, ambulation, recommend Lovenox - WBAT operative extremity - Appreciate care per medical team   [facility] 11/15/2021, 8:10 AM

## 2021-11-15 NOTE — Progress Notes (Addendum)
PROGRESS NOTE  JAZELL ROSENAU ZRA:[id number] DOB: [birth year] DOA: 11/11/2021 PCP: Sandrea Hughs, NP   LOS: 4 days   Brief narrative:  Heather [name] is a 87 y.o. female with past medical history of anxiety, hypertension, diastolic congestive heart failure, baseline confusion and hypertension, GERD, was brought into the hospital by EMS after sustaining a fall at home after she lost her balance. Patient had a mechanical fall and was noted to have a displaced fracture of the right femoral neck.  Initial vitals were stable.  Patient was then admitted to the hospital for further evaluation and treatment.  Assessment/Plan:  Principal Problem:   Closed displaced fracture of right femoral neck (HCC) Active Problems:   Anxiety state   Primary hypertension   Diastolic CHF ([place])   GERD without esophagitis   Constipation   Restlessness   Closed right intertrochanteric hip fracture. Patient underwent right cephalomedullary nailing on 11/13/2021.  Physical therapy has been consulted who recommended skilled nursing facility placement for rehabilitation.  We will follow-up with orthopedics as outpatient in 2 weeks for staple removal..  Weightbearing as tolerated.  Lovenox for DVT prophylaxis.  Restlessness confusional state. At baseline patient is confused.  Patient's son is the Marine scientist.  Continue risperidone and Haldol.  Currently sleeping at bedside.  Continues to refuse medications at times.    Anxiety state Continue risperidone and Haldol.  Try to avoid benzodiazepines.  Essential hypertension On Coreg and oral Lasix.  Received gentle IV fluid bolus yesterday for mild hypotension.  We will put the medications with holding parameters.  Chronic diastolic CHF  Continue Coreg Lasix with holding parameters with.  Compensated at this time   GERD without esophagitis Continue MiraLAX and stool softeners.  Incidental COVID-positive.   No evidence of hypoxia.  We will hold off  with any treatment.  Supportive care.  Abnormal urinalysis with confusion disorientation.  Possibility of UTI.  Completed a Rocephin IV course for 3 days  DVT prophylaxis: enoxaparin (LOVENOX) injection 30 mg Start: 11/14/21 1000 SCDs Start: 11/11/21 1525  Code Status: Full code  Family Communication:  Spoke with the patient's  nephew at bedside.  Status is: Inpatient  Remains inpatient appropriate because:  Hip fracture status postsurgical intervention, need for skilled nursing facility placement.  Consultants: Orthopedics  Procedures: Right hip cephalomedullary nailing on 11/13/2021  Anti-infectives:  Rocephin IV 12/27>  Anti-infectives (From admission, onward)    Start     Dose/Rate Route Frequency Ordered Stop   11/14/21 0000  ceFAZolin (ANCEF) IVPB 2g/100 mL premix        2 g 200 mL/hr over 30 Minutes Intravenous  Once 11/13/21 1857 11/14/21 0130   11/13/21 0600  ceFAZolin (ANCEF) IVPB 2g/100 mL premix        2 g 200 mL/hr over 30 Minutes Intravenous On call to O.R. 11/11/21 1425 11/13/21 1653   11/12/21 1500  cefTRIAXone (ROCEPHIN) 1 g in sodium chloride 0.9 % 100 mL IVPB        1 g 200 mL/hr over 30 Minutes Intravenous Every 24 hours 11/12/21 1210        Subjective: Today, patient was seen and examined at bedside.  Patient states that she does not want to be bothered.  Had refused some medication but appears to be mildly sleepy lying on 1 side.  Patient's family at bedside.   Objective: Vitals:   11/14/21 2134 11/15/21 0515  BP: 108/61 116/66  Pulse: 89 87  Resp: 17 17  Temp: 98.4 F (  36.9 C) 98.2 F (36.8 C)  SpO2: 98% 100%    Intake/Output Summary (Last 24 hours) at 11/15/2021 1303 Last data filed at 11/15/2021 0600 Gross per 24 hour  Intake 100.17 ml  Output --  Net 100.17 ml    Filed Weights   11/11/21 0930  Weight: 49.9 kg   Body mass index is 22.99 kg/m.   Physical Exam:  General:  Average built, not in obvious distress, alert  awake and communicative, mildly sleepy, HENT:   No scleral pallor or icterus noted. Oral mucosa is moist.  Chest:    Diminished breath sounds bilaterally. No crackles or wheezes.  CVS: S1 &S2 heard. No murmur.  Regular rate and rhythm. Abdomen: Soft, nontender, nondistended.  Bowel sounds are heard.   Extremities: No cyanosis, clubbing or edema.  Peripheral pulses are palpable.  Right hip surgical site with mild soakage Psych: Alert, awake and confused normal mood CNS:  No cranial nerve deficits.  Moves extremities Skin: Warm and dry.  Right hip with dressing.   Data Review: I have personally reviewed the following laboratory data and studies,  CBC: Recent Labs  Lab 11/11/21 2115 11/12/21 0408 11/14/21 0359  WBC 9.9 9.9 8.5  NEUTROABS 7.4  --   --   HGB 12.7 12.4 9.4*  HCT 40.0 38.5 30.3*  MCV 97.3 95.5 99.0  PLT 185 159 124*    Basic Metabolic Panel: Recent Labs  Lab 11/11/21 2115 11/12/21 0408 11/14/21 0359  NA 146* 143 144  K 4.8 4.2 3.8  CL 109 107 107  CO2 30 29 27   GLUCOSE 124* 125* 132*  BUN 18 18 25*  CREATININE 0.94 0.81 0.83  CALCIUM 8.9 8.7* 8.2*  MG  --   --  2.4    Liver Function Tests: Recent Labs  Lab 11/11/21 2115 11/12/21 0408  AST 48* 54*  ALT 28 28  ALKPHOS 40 41  BILITOT 1.3* 1.3*  PROT 6.7 6.7  ALBUMIN 3.7 3.4*    No results for input(s): LIPASE, AMYLASE in the last 168 hours. No results for input(s): AMMONIA in the last 168 hours. Cardiac Enzymes: No results for input(s): CKTOTAL, CKMB, CKMBINDEX, TROPONINI in the last 168 hours. BNP (last 3 results) No results for input(s): BNP in the last 8760 hours.  ProBNP (last 3 results) No results for input(s): PROBNP in the last 8760 hours.  CBG: No results for input(s): GLUCAP in the last 168 hours. Recent Results (from the past 240 hour(s))  Surgical pcr screen     Status: None   Collection Time: 11/12/21  3:37 AM   Specimen: Nasal Mucosa; Nasal Swab  Result Value Ref Range  Status   MRSA, PCR NEGATIVE NEGATIVE Final   Staphylococcus aureus NEGATIVE NEGATIVE Final    Comment: (NOTE) The Xpert SA Assay (FDA approved for NASAL specimens in patients 70 years of age and older), is one component of a comprehensive surveillance program. It is not intended to diagnose infection nor to guide or monitor treatment. Performed at [facility], [place] [address], [place], [place] [address]   Resp Panel by RT-PCR (Flu A&B, Covid) Nasopharyngeal Swab     Status: Abnormal   Collection Time: 11/12/21  4:05 AM   Specimen: Nasopharyngeal Swab; Nasopharyngeal(NP) swabs in vial transport medium  Result Value Ref Range Status   SARS Coronavirus 2 by RT PCR POSITIVE (A) NEGATIVE Final    Comment: (NOTE) SARS-CoV-2 target nucleic acids are DETECTED.  The SARS-CoV-2 RNA is generally detectable  in upper respiratory specimens during the acute phase of infection. Positive results are indicative of the presence of the identified virus, but do not rule out bacterial infection or co-infection with other pathogens not detected by the test. Clinical correlation with patient history and other diagnostic information is necessary to determine patient infection status. The expected result is Negative.  Fact Sheet for Patients: EntrepreneurPulse.com.au  Fact Sheet for Healthcare Providers: IncredibleEmployment.be  This test is not yet approved or cleared by the [place] FDA and  has been authorized for detection and/or diagnosis of SARS-CoV-2 by FDA under an Emergency Use Authorization (EUA).  This EUA will remain in effect (meaning this test can be used) for the duration of  the COVID-19 declaration under Section 564(b)(1) of the A ct, 21 U.S.C. section 360bbb-3(b)(1), unless the authorization is terminated or revoked sooner.     Influenza A by PCR NEGATIVE NEGATIVE Final   Influenza B by PCR NEGATIVE NEGATIVE Final     Comment: (NOTE) The Xpert Xpress SARS-CoV-2/FLU/RSV plus assay is intended as an aid in the diagnosis of influenza from Nasopharyngeal swab specimens and should not be used as a sole basis for treatment. Nasal washings and aspirates are unacceptable for Xpert Xpress SARS-CoV-2/FLU/RSV testing.  Fact Sheet for Patients: EntrepreneurPulse.com.au  Fact Sheet for Healthcare Providers: IncredibleEmployment.be  This test is not yet approved or cleared by the [place] FDA and has been authorized for detection and/or diagnosis of SARS-CoV-2 by FDA under an Emergency Use Authorization (EUA). This EUA will remain in effect (meaning this test can be used) for the duration of the COVID-19 declaration under Section 564(b)(1) of the Act, 21 U.S.C. section 360bbb-3(b)(1), unless the authorization is terminated or revoked.  Performed at [facility], [place] [address], [place], [place] [address]       Studies: DG C-Arm 1-60 Min-No Report  Result Date: 11/13/2021 Fluoroscopy was utilized by the requesting physician.  No radiographic interpretation.   DG HIP OPERATIVE UNILAT W OR W/O PELVIS RIGHT  Result Date: 11/13/2021 CLINICAL DATA:  Intraoperative for intramedullary nail EXAM: OPERATIVE right HIP (WITH PELVIS IF PERFORMED) 4 VIEWS TECHNIQUE: Fluoroscopic spot image(s) were submitted for interpretation post-operatively. COMPARISON:  11/11/2021 FINDINGS: Intraoperative fluoroscopy is utilized for surgical control purposes. Fluoroscopy time is indicated at 1 minute 12 seconds. Dose is 9.3580 mGy. Four spot fluoroscopic images are obtained. Spot fluoroscopic images obtained demonstrate placement of a long intramedullary rod with compression bolt fixation of inter trochanteric fracture of the proximal right femur. A single distal locking screw is identified in the distal femoral metaphysis. Mild residual displacement of the fracture fragments  with near anatomic alignment suggested. IMPRESSION: Intraoperative fluoroscopy obtained during surgical control purposes demonstrating internal fixation of an inter trochanteric fracture of the right proximal femur. Electronically Signed   By: Lucienne Capers M.D.   On: 11/13/2021 19:31      Flora Lipps, MD  [facility] 11/15/2021  If 7PM-7AM, please contact night-coverage

## 2021-11-15 NOTE — TOC Progression Note (Signed)
Transition of Care [facility]) - Progression Note   Patient Details  Name: Heather [name] MRN: [id number] Date of Birth: 1918-05-28  Transition of Care [facility]) CM/SW [place], LCSW Phone Number: 11/15/2021, 1:50 PM  Clinical Narrative: CSW spoke with patient's son, Heather [name], to discus PT recommendations now that patient has had surgery. Son is agreeable to SNF and is aware that patient testing positive will limit bed offers/options until patient is at least 10 days post-positive test.  FL2 done; PASRR received. Initial referral faxed out. TOC awaiting bed offers.  Expected Discharge Plan: Skilled Nursing Facility Barriers to Discharge: SNF Pending bed offer, Insurance Authorization  Expected Discharge Plan and Services Expected Discharge Plan: [place] In-house Referral: Clinical Social Work Post Acute Care Choice: [place] Living arrangements for the past 2 months: Single Family Home  Readmission Risk Interventions No flowsheet data found.

## 2021-11-15 NOTE — Care Management Important Message (Signed)
Important Message  Patient Details IM Letter placed in Patients room. Name: Heather [name] MRN: [id number] Date of Birth: [birth year]   Medicare Important Message Given:  Yes     Kerin Salen 11/15/2021, 12:07 PM

## 2021-11-15 NOTE — Progress Notes (Signed)
Pt has refused all meds and care during this shift. Dr. Louanne Belton notified throughout the day.

## 2021-11-15 NOTE — NC FL2 (Signed)
[place] LEVEL OF CARE SCREENING TOOL     IDENTIFICATION  Patient Name: Heather [name] Birthdate: [birth year] Sex: female Admission Date (Current Location): 11/11/2021  [facility] and [place] Number:  Herbalist and Address:  [facility],  [place], [place]      Provider Number: [id number]  Attending Physician Name and Address:  Flora Lipps, MD  Relative Name and Phone Number:  Kamaree Berkel (son) Ph: [phone]    Current Level of Care: Hospital Recommended Level of Care: [place] Prior Approval Number:    Date Approved/Denied:   PASRR Number: [id number]  Discharge Plan: SNF    Current Diagnoses: Patient Active Problem List   Diagnosis Date Noted   Closed displaced fracture of right femoral neck ([place]) 11/11/2021   Restlessness 11/11/2021   Seasonal allergies 06/14/2021   Primary osteoarthritis involving multiple joints 06/14/2021   Pre-ulcerative corn or callous 10/04/2020   Osteoporosis 05/22/2020   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 ([place]) 05/17/2020   Bronchiectasis ([place]) 05/17/2020   Kyphosis of thoracic region 05/17/2020   SI (sacroiliac) joint inflammation ([place]) 10/04/2019   DDD (degenerative disc disease), lumbar 10/04/2019   Hearing decreased, bilateral 06/30/2019   Chronic pain syndrome 03/03/2019   Unilateral primary osteoarthritis, right knee 01/13/2019   Medication management 02/28/2018   Urinary incontinence without sensory awareness 12/02/2017   Arthritis of left hip 10/20/2017   Functional abdominal pain syndrome 09/02/2017   Encounter for chronic pain management 07/22/2017   Dementia with behavioral disturbance 06/25/2017   Vitamin D deficiency 05/21/2017   Chronic LLQ pain 11/23/2014   Esophageal dysmotility 10/27/2014   Arthritis of hand, degenerative 04/10/2014   Other and unspecified hyperlipidemia 04/10/2014   Chronic bronchitis ([place]) 05/30/2013   Hx of breast cancer  08/30/2012   Venous insufficiency of both lower extremities 06/12/2011   GERD without esophagitis 77/82/4235   Diastolic CHF ([place]) 36/14/4315   Constipation 01/18/2009   Cervical spondylosis without myelopathy 03/21/2008   Diaphragmatic hernia 12/26/2007   Anxiety state 09/15/2007   Primary hypertension 09/15/2007   Venous (peripheral) insufficiency 09/15/2007    Orientation RESPIRATION BLADDER Height & Weight     Self  Normal Incontinent Weight: 110 lb (49.9 kg) Height:  4\' 10"  (147.3 cm)  BEHAVIORAL SYMPTOMS/MOOD NEUROLOGICAL BOWEL NUTRITION STATUS   (N/A)  (N/A) Continent Diet (Regular diet)  AMBULATORY STATUS COMMUNICATION OF NEEDS Skin   Extensive Assist Verbally Surgical wounds, Other (Comment) (Cracking: bilateral feet; Ecchymosis: bilateral arms)                       Personal Care Assistance Level of Assistance  Bathing, Feeding, Dressing Bathing Assistance: Maximum assistance Feeding assistance: Limited assistance Dressing Assistance: Maximum assistance     Functional Limitations Info  Sight, Hearing, Speech Sight Info: Impaired Hearing Info: Impaired Speech Info: Adequate    SPECIAL CARE FACTORS FREQUENCY  PT (By licensed PT), OT (By licensed OT)     PT Frequency: 5x's/week OT Frequency: 5x's/week            Contractures Contractures Info: Not present    Additional Factors Info  Code Status, Allergies, Psychotropic Code Status Info: Full Allergies Info: Fentanyl, Naprosyn (Naproxen) Psychotropic Info: Risperdal, Xanax, Haldol         Current Medications (11/15/2021):  This is the current hospital active medication list Current Facility-Administered Medications  Medication Dose Route Frequency Provider Last Rate Last Admin   acetaminophen (TYLENOL) tablet 650  mg  650 mg Oral Q6H PRN Vanetta Mulders, MD   650 mg at 11/14/21 0700   carvedilol (COREG) tablet 3.125 mg  3.125 mg Oral BID Pokhrel, Laxman, MD   3.125 mg at 11/13/21 2121    cefTRIAXone (ROCEPHIN) 1 g in sodium chloride 0.9 % 100 mL IVPB  1 g Intravenous Q24H Vanetta Mulders, MD 200 mL/hr at 11/12/21 1556 1 g at 11/12/21 1556   enoxaparin (LOVENOX) injection 30 mg  30 mg Subcutaneous Q24H Pokhrel, Laxman, MD       famotidine (PEPCID) tablet 20 mg  20 mg Oral Daily Vanetta Mulders, MD       feeding supplement (ENSURE ENLIVE / ENSURE PLUS) liquid 237 mL  237 mL Oral BID BM Vanetta Mulders, MD   237 mL at 11/14/21 1013   feeding supplement (ENSURE PRE-SURGERY) liquid 296 mL  296 mL Oral Once Vanetta Mulders, MD       furosemide (LASIX) tablet 10 mg  10 mg Oral Rhoderick Moody, MD       haloperidol lactate (HALDOL) injection 1 mg  1 mg Intravenous Q6H PRN Vanetta Mulders, MD   1 mg at 11/12/21 0854   morphine 2 MG/ML injection 2 mg  2 mg Intravenous Q2H PRN Pokhrel, Laxman, MD   2 mg at 11/14/21 0239   multivitamin with minerals tablet 1 tablet  1 tablet Oral Daily Vanetta Mulders, MD       ondansetron [facility]) tablet 4 mg  4 mg Oral Q6H PRN Vanetta Mulders, MD       Or   ondansetron [facility]) injection 4 mg  4 mg Intravenous Q6H PRN Vanetta Mulders, MD       polyethylene glycol (MIRALAX / GLYCOLAX) packet 17 g  17 g Oral Daily Vanetta Mulders, MD   17 g at 11/14/21 1025   polyvinyl alcohol (LIQUIFILM TEARS) 1.4 % ophthalmic solution 1 drop  1 drop Both Eyes Daily PRN Vanetta Mulders, MD       risperiDONE (RISPERDAL M-TABS) disintegrating tablet 0.5 mg  0.5 mg Oral QHS Vanetta Mulders, MD   0.5 mg at 11/13/21 2228   senna-docusate (Senokot-S) tablet 1 tablet  1 tablet Oral BID Vanetta Mulders, MD         Discharge Medications: Please see discharge summary for a list of discharge medications.  Relevant Imaging Results:  Relevant Lab Results:   Additional Information SSN: [id number]  Sherie Don, LCSW

## 2021-11-15 NOTE — Anesthesia Postprocedure Evaluation (Signed)
Anesthesia Post Note  Patient: Heather [name]  Procedure(s) Performed: INTRAMEDULLARY (IM) NAIL INTERTROCHANTRIC (Right)     Patient location during evaluation: PACU Anesthesia Type: Spinal Level of consciousness: responds to stimulation Pain management: pain level controlled Vital Signs Assessment: post-procedure vital signs reviewed and stable Respiratory status: spontaneous breathing, respiratory function stable and patient connected to nasal cannula oxygen Cardiovascular status: blood pressure returned to baseline and stable Postop Assessment: no apparent nausea or vomiting Anesthetic complications: no   No notable events documented.  Last Vitals:  Vitals:   11/14/21 2134 11/15/21 0515  BP: 108/61 116/66  Pulse: 89 87  Resp: 17 17  Temp: 36.9 C 36.8 C  SpO2: 98% 100%    Last Pain:  Vitals:   11/15/21 0515  TempSrc: Oral  PainSc:                  Heather [name] Heather [name]

## 2021-11-16 DIAGNOSIS — U071 COVID-19: Secondary | ICD-10-CM | POA: Diagnosis not present

## 2021-11-16 DIAGNOSIS — S72001A Fracture of unspecified part of neck of right femur, initial encounter for closed fracture: Secondary | ICD-10-CM | POA: Diagnosis not present

## 2021-11-16 DIAGNOSIS — N390 Urinary tract infection, site not specified: Secondary | ICD-10-CM | POA: Diagnosis not present

## 2021-11-16 DIAGNOSIS — F411 Generalized anxiety disorder: Secondary | ICD-10-CM | POA: Diagnosis not present

## 2021-11-16 LAB — BASIC METABOLIC PANEL
Anion gap: 4 — ABNORMAL LOW (ref 5–15)
BUN: 22 mg/dL (ref 8–23)
CO2: 30 mmol/L (ref 22–32)
Calcium: 8 mg/dL — ABNORMAL LOW (ref 8.9–10.3)
Chloride: 108 mmol/L (ref 98–111)
Creatinine, Ser: 0.72 mg/dL (ref 0.44–1.00)
GFR, Estimated: >60 mL/min (ref >60)
Glucose, Bld: 110 mg/dL — ABNORMAL HIGH (ref 70–99)
Potassium: 4.1 mmol/L (ref 3.5–5.1)
Sodium: 142 mmol/L (ref 135–145)

## 2021-11-16 LAB — CBC
HCT: 27.4 % — ABNORMAL LOW (ref 36.0–46.0)
Hemoglobin: 8.9 g/dL — ABNORMAL LOW (ref 12.0–15.0)
MCH: 31.1 pg (ref 26.0–34.0)
MCHC: 32.5 g/dL (ref 30.0–36.0)
MCV: 95.8 fL (ref 80.0–100.0)
Platelets: 166 10*3/uL (ref 150–400)
RBC: 2.86 MIL/uL — ABNORMAL LOW (ref 3.87–5.11)
RDW: 14.1 % (ref 11.5–15.5)
WBC: 7.3 10*3/uL (ref 4.0–10.5)
nRBC: 0 % (ref 0.0–0.2)

## 2021-11-16 MED ORDER — CEPHALEXIN 250 MG PO CAPS
250.0000 mg | ORAL_CAPSULE | Freq: Two times a day (BID) | ORAL | Status: DC
  Administered 2021-11-16 – 2021-11-19 (×7): 250 mg via ORAL
  Filled 2021-11-16 (×9): qty 1

## 2021-11-16 MED ORDER — SULFAMETHOXAZOLE-TRIMETHOPRIM 800-160 MG PO TABS: 1.0000 | ORAL_TABLET | Freq: Two times a day (BID) | ORAL | Status: DC

## 2021-11-16 NOTE — Progress Notes (Signed)
Physical Therapy Treatment Patient Details Name: Heather [name] MRN: [id number] DOB: 05-07-1918 Today's Date: 11/16/2021   History of Present Illness 87 y.o. female with past medical history of anxiety, hypertension, diastolic congestive heart failure, GERD, was brought into the hospital by EMS after sustaining a fall at home after she lost her balance patient had a mechanical fall and at  baseline is confused and delirious. Dx of R femoral neck fx, s/p IM nail 11/13/21, Covid positive.    PT Comments    Increased time spent explaining reason for visit. Increased time to get pt to participate. Increased assistance required to encourage participation as well. Pt can be resistant but she also followed commands during session with time (when she chose to do so). Continue to recommend SNF for rehab.     Recommendations for follow up therapy are one component of a multi-disciplinary discharge planning process, led by the attending physician.  Recommendations may be updated based on patient status, additional functional criteria and insurance authorization.  Follow Up Recommendations  Skilled nursing-short term rehab (<3 hours/day)     Assistance Recommended at Discharge Frequent or constant Supervision/Assistance  Equipment Recommendations  None recommended by PT    Recommendations for Other Services       Precautions / Restrictions Precautions Precautions: Fall Restrictions Weight Bearing Restrictions: No RLE Weight Bearing: Weight bearing as tolerated     Mobility  Bed Mobility Overal bed mobility: Needs Assistance Bed Mobility: Supine to Sit;Sit to Supine     Supine to sit: Total assist;+2 for physical assistance;+2 for safety/equipment;HOB elevated Sit to supine: Tot assist;+2 for physical assistance;+2 for safety/equipment;HOB elevated   General bed mobility comments: Max encouragement. Pt could likely do more for herself but she is resistant. Utilized bedpad to scoot  and position pt at EOB. Once EOB, pt was able to balance with Min A (at times Min guard A). Pt stated she could not sit for long so proceeded to standing at bedside with RW.     Transfers Overall transfer level: Needs assistance Equipment used: Rolling walker (2 wheels) Transfers: Sit to/from Stand Sit to Stand: Max assist;+2 physical assistance;+2 safety/equipment;From elevated surface           General transfer comment: Max encouragement. Assist to power up, stabilize, control descent. Multimodal cueing required    Ambulation/Gait Max Assist; +2 for safety/equipment; +2 for assistance              General Gait Details: Pt took some shuffle steps towards HOB with RW. She required assist to support herself in standing and to manage RW   Stairs             Wheelchair Mobility    Modified Rankin (Stroke Patients Only)       Balance Overall balance assessment: Needs assistance;History of Falls Sitting-balance support: Feet supported;Bilateral upper extremity supported Sitting balance-Leahy Scale: Poor Sitting balance - Comments: Fair -Poor. Postural control: Posterior lean Standing balance support: Bilateral upper extremity supported;Reliant on assistive device for balance Standing balance-Leahy Scale: Poor                              Cognition Arousal/Alertness: Awake/alert Behavior During Therapy: Restless;Anxious Overall Cognitive Status: No family/caregiver present to determine baseline cognitive functioning  General Comments: pt is confused at baseline. she will follow commands-when she wants to. she is internally distracted mostly.        Exercises      General Comments        Pertinent Vitals/Pain Pain Assessment: Faces Faces Pain Scale: Hurts even more Pain Location: R hip with movement Pain Descriptors / Indicators: Grimacing;Guarding Pain Intervention(s): Limited activity within  patient's tolerance;Monitored during session;Repositioned    Home Living                          Prior Function            PT Goals (current goals can now be found in the care plan section) Progress towards PT goals: Progressing toward goals    Frequency    Min 3X/week      PT Plan Current plan remains appropriate    Co-evaluation              AM-PAC PT "6 Clicks" Mobility   Outcome Measure  Help needed turning from your back to your side while in a flat bed without using bedrails?: Total Help needed moving from lying on your back to sitting on the side of a flat bed without using bedrails?: Total Help needed moving to and from a bed to a chair (including a wheelchair)?: Total Help needed standing up from a chair using your arms (e.g., wheelchair or bedside chair)?: Total Help needed to walk in hospital room?: Total Help needed climbing 3-5 steps with a railing? : Total 6 Click Score: 6    End of Session   Activity Tolerance: Patient tolerated treatment well Patient left: in bed;with call bell/phone within reach;with bed alarm set   PT Visit Diagnosis: Difficulty in walking, not elsewhere classified (R26.2);Pain;History of falling (Z91.81) Pain - Right/Left: Right Pain - part of body: Hip     Time: 7858-8502 PT Time Calculation (min) (ACUTE ONLY): 22 min  Charges:  $Therapeutic Activity: 8-22 mins                         Doreatha Massed, PT Acute Rehabilitation  Office: [phone] Pager: [phone]

## 2021-11-16 NOTE — Progress Notes (Signed)
PROGRESS NOTE  Heather [name]:[id number] DOB: 07-Apr-1918 DOA: 11/11/2021 PCP: Sandrea Hughs, NP   LOS: 5 days   Brief narrative:  Heather [name] is a 87 y.o. female with past medical history of anxiety, hypertension, diastolic congestive heart failure, baseline confusion and hypertension, GERD, was brought into the hospital by EMS after sustaining a fall at home after she lost her balance. Patient had a mechanical fall and was noted to have a displaced fracture of the right femoral neck.  Initial vitals were stable.  Patient was then admitted to the hospital for further evaluation and treatment.  Assessment/Plan:  Principal Problem:   Closed displaced fracture of right femoral neck (HCC) Active Problems:   Anxiety state   Primary hypertension   Diastolic CHF ([place])   GERD without esophagitis   Constipation   Restlessness   Closed right intertrochanteric hip fracture. Patient underwent right cephalomedullary nailing on 11/13/2021.  Physical therapy has been consulted who recommended skilled nursing facility placement for rehabilitation.  We will follow-up with orthopedics as outpatient in 2 weeks for staple removal.\  Weightbearing as tolerated.  Lovenox subq for DVT prophylaxis.  Restlessness, confusional state. At baseline patient is confused.  Patient's son is the Marine scientist.  Continue risperidone and Haldol. .  Continues to refuse medications at times.    Anxiety state Continue risperidone and Haldol.  Try to avoid benzodiazepines.  Essential hypertension On Coreg and oral Lasix.  Continue with holding parameters.  Chronic diastolic CHF  Continue Coreg Lasix with holding parameters Compensated at this time   GERD without esophagitis Continue MiraLAX and stool softeners.  Incidental COVID-positive.   No evidence of hypoxia.  We will hold off with any treatment.  Supportive care.  Abnormal urinalysis with confusion disorientation.  Possibility of UTI.   Refused rocephin IV doses.  DVT prophylaxis: enoxaparin (LOVENOX) injection 30 mg Start: 11/14/21 1000 SCDs Start: 11/11/21 1525  Code Status: Full code  Family Communication:  Spoke with the patient's  nephew at bedside on 11/15/21  Status is: Inpatient  Remains inpatient appropriate because:  Hip fracture status postsurgical intervention, need for skilled nursing facility placement.  Consultants: Orthopedics  Procedures: Right hip cephalomedullary nailing on 11/13/2021  Anti-infectives:  Rocephin IV 12/27>  Anti-infectives (From admission, onward)    Start     Dose/Rate Route Frequency Ordered Stop   11/14/21 0000  ceFAZolin (ANCEF) IVPB 2g/100 mL premix        2 g 200 mL/hr over 30 Minutes Intravenous  Once 11/13/21 1857 11/14/21 0130   11/13/21 0600  ceFAZolin (ANCEF) IVPB 2g/100 mL premix        2 g 200 mL/hr over 30 Minutes Intravenous On call to O.R. 11/11/21 1425 11/13/21 1653   11/12/21 1500  cefTRIAXone (ROCEPHIN) 1 g in sodium chloride 0.9 % 100 mL IVPB        1 g 200 mL/hr over 30 Minutes Intravenous Every 24 hours 11/12/21 1210        Subjective: Today, patient was seen and examined at bedside.  Patient states that she has not refused her medication.  Denies overt pain nausea vomiting.  Poor historian.    Objective: Vitals:   11/15/21 2011 11/16/21 0502  BP: 120/63 (!) 112/52  Pulse: 88 80  Resp: 16 16  Temp: 98.2 F (36.8 C) (!) 97.3 F (36.3 C)  SpO2: 100% 100%    Intake/Output Summary (Last 24 hours) at 11/16/2021 1028 Last data filed at 11/16/2021 1013 Gross per 24  hour  Intake 420 ml  Output 200 ml  Net 220 ml    Filed Weights   11/11/21 0930  Weight: 49.9 kg   Body mass index is 22.99 kg/m.   Physical Exam:  General: Average built, alert awake and communicative,  HENT:   No scleral pallor or icterus noted. Oral mucosa is moist.  Chest:    Diminished breath sounds bilaterally. No crackles or wheezes.  CVS: S1 &S2 heard. No  murmur.  Regular rate and rhythm. Abdomen: Soft, nontender, nondistended.  Bowel sounds are heard.   Extremities: No cyanosis, clubbing or edema.  Peripheral pulses are palpable.  Hip site with dressing.   Psych: Alert, awake communicative, confused, CNS:  No cranial nerve deficits.  Moves all extremities. Skin: Warm and dry.  Right hip with dressing.   Data Review: I have personally reviewed the following laboratory data and studies,  CBC: Recent Labs  Lab 11/11/21 2115 11/12/21 0408 11/14/21 0359 11/16/21 0303  WBC 9.9 9.9 8.5 7.3  NEUTROABS 7.4  --   --   --   HGB 12.7 12.4 9.4* 8.9*  HCT 40.0 38.5 30.3* 27.4*  MCV 97.3 95.5 99.0 95.8  PLT 185 159 124* 924    Basic Metabolic Panel: Recent Labs  Lab 11/11/21 2115 11/12/21 0408 11/14/21 0359 11/16/21 0303  NA 146* 143 144 142  K 4.8 4.2 3.8 4.1  CL 109 107 107 108  CO2 30 29 27 30   GLUCOSE 124* 125* 132* 110*  BUN 18 18 25* 22  CREATININE 0.94 0.81 0.83 0.72  CALCIUM 8.9 8.7* 8.2* 8.0*  MG  --   --  2.4  --     Liver Function Tests: Recent Labs  Lab 11/11/21 2115 11/12/21 0408  AST 48* 54*  ALT 28 28  ALKPHOS 40 41  BILITOT 1.3* 1.3*  PROT 6.7 6.7  ALBUMIN 3.7 3.4*    No results for input(s): LIPASE, AMYLASE in the last 168 hours. No results for input(s): AMMONIA in the last 168 hours. Cardiac Enzymes: No results for input(s): CKTOTAL, CKMB, CKMBINDEX, TROPONINI in the last 168 hours. BNP (last 3 results) No results for input(s): BNP in the last 8760 hours.  ProBNP (last 3 results) No results for input(s): PROBNP in the last 8760 hours.  CBG: No results for input(s): GLUCAP in the last 168 hours. Recent Results (from the past 240 hour(s))  Surgical pcr screen     Status: None   Collection Time: 11/12/21  3:37 AM   Specimen: Nasal Mucosa; Nasal Swab  Result Value Ref Range Status   MRSA, PCR NEGATIVE NEGATIVE Final   Staphylococcus aureus NEGATIVE NEGATIVE Final    Comment: (NOTE) The Xpert  SA Assay (FDA approved for NASAL specimens in patients 29 years of age and older), is one component of a comprehensive surveillance program. It is not intended to diagnose infection nor to guide or monitor treatment. Performed at [facility], [place] [address] [place], [place] [address]   Resp Panel by RT-PCR (Flu A&B, Covid) Nasopharyngeal Swab     Status: Abnormal   Collection Time: 11/12/21  4:05 AM   Specimen: Nasopharyngeal Swab; Nasopharyngeal(NP) swabs in vial transport medium  Result Value Ref Range Status   SARS Coronavirus 2 by RT PCR POSITIVE (A) NEGATIVE Final    Comment: (NOTE) SARS-CoV-2 target nucleic acids are DETECTED.  The SARS-CoV-2 RNA is generally detectable in upper respiratory specimens during the acute phase of infection. Positive results are indicative  of the presence of the identified virus, but do not rule out bacterial infection or co-infection with other pathogens not detected by the test. Clinical correlation with patient history and other diagnostic information is necessary to determine patient infection status. The expected result is Negative.  Fact Sheet for Patients: EntrepreneurPulse.com.au  Fact Sheet for Healthcare Providers: IncredibleEmployment.be  This test is not yet approved or cleared by the [place] FDA and  has been authorized for detection and/or diagnosis of SARS-CoV-2 by FDA under an Emergency Use Authorization (EUA).  This EUA will remain in effect (meaning this test can be used) for the duration of  the COVID-19 declaration under Section 564(b)(1) of the A ct, 21 U.S.C. section 360bbb-3(b)(1), unless the authorization is terminated or revoked sooner.     Influenza A by PCR NEGATIVE NEGATIVE Final   Influenza B by PCR NEGATIVE NEGATIVE Final    Comment: (NOTE) The Xpert Xpress SARS-CoV-2/FLU/RSV plus assay is intended as an aid in the diagnosis of influenza from  Nasopharyngeal swab specimens and should not be used as a sole basis for treatment. Nasal washings and aspirates are unacceptable for Xpert Xpress SARS-CoV-2/FLU/RSV testing.  Fact Sheet for Patients: EntrepreneurPulse.com.au  Fact Sheet for Healthcare Providers: IncredibleEmployment.be  This test is not yet approved or cleared by the [place] FDA and has been authorized for detection and/or diagnosis of SARS-CoV-2 by FDA under an Emergency Use Authorization (EUA). This EUA will remain in effect (meaning this test can be used) for the duration of the COVID-19 declaration under Section 564(b)(1) of the Act, 21 U.S.C. section 360bbb-3(b)(1), unless the authorization is terminated or revoked.  Performed at [facility], [place] [address], [place], [place] [address]       Studies: No results found.    Flora Lipps, MD  [facility] 11/16/2021  If 7PM-7AM, please contact night-coverage

## 2021-11-16 NOTE — Plan of Care (Signed)
  Problem: Coping: Goal: Level of anxiety will decrease Outcome: Progressing   Problem: Pain Managment: Goal: General experience of comfort will improve Outcome: Progressing   Problem: Safety: Goal: Ability to remain free from injury will improve Outcome: Progressing   

## 2021-11-17 DIAGNOSIS — S72001A Fracture of unspecified part of neck of right femur, initial encounter for closed fracture: Secondary | ICD-10-CM | POA: Diagnosis not present

## 2021-11-17 DIAGNOSIS — U071 COVID-19: Secondary | ICD-10-CM | POA: Diagnosis not present

## 2021-11-17 DIAGNOSIS — N390 Urinary tract infection, site not specified: Secondary | ICD-10-CM | POA: Diagnosis not present

## 2021-11-17 DIAGNOSIS — F411 Generalized anxiety disorder: Secondary | ICD-10-CM | POA: Diagnosis not present

## 2021-11-17 NOTE — Progress Notes (Signed)
PROGRESS NOTE  Heather [name] QQV:[id number] DOB: 05-18-1918 DOA: 11/11/2021 PCP: Sandrea Hughs, NP   LOS: 6 days   Brief narrative:  Heather [name] is a 88 y.o. female with past medical history of anxiety, hypertension, diastolic congestive heart failure, baseline confusion and hypertension, GERD, was brought into the hospital by EMS after sustaining a fall at home after she lost her balance. Patient had a mechanical fall and was noted to have a displaced fracture of the right femoral neck.  Initial vitals were stable.  Patient was then admitted to the hospital for further evaluation and treatment.  Assessment/Plan:  Principal Problem:   Closed displaced fracture of right femoral neck (HCC) Active Problems:   Anxiety state   Primary hypertension   Diastolic CHF ([place])   GERD without esophagitis   Constipation   Restlessness   Closed right intertrochanteric hip fracture. Patient underwent right cephalomedullary nailing on 11/13/2021.  Physical therapy has been consulted who recommended skilled nursing facility placement for rehabilitation.  Plan is to ollow-up with orthopedics as outpatient in 2 weeks for staple removal.  Weightbearing as tolerated.  Lovenox subq for DVT prophylaxis.  Restlessness, confusional state. At baseline patient is confused.  Patient's son is the Marine scientist.  Continue risperidone and Haldol. .  Continues to refuse medications at times.  Advised to be more alert awake and communicative today.    Anxiety state Continue risperidone and Haldol.  Try to avoid benzodiazepines.  Essential hypertension On Coreg and oral Lasix.  Continue with holding parameters.  Chronic diastolic CHF  Continue Coreg Lasix with holding parameters. Compensated at this time   GERD without esophagitis Continue MiraLAX and stool softeners.  Incidental COVID-positive.   No evidence of hypoxia.  We will hold off with any treatment.  Supportive care.  Abnormal  urinalysis with confusion disorientation.  Possibility of UTI.  Refused rocephin IV doses.  Has been started on Keflex secondary to complaints  Disposition.  Physical therapy has recommended skilled nursing facility placement.  Since patient is COVID-positive awaiting for 10-day period.  Medically stable for disposition.  DVT prophylaxis: enoxaparin (LOVENOX) injection 30 mg Start: 11/14/21 1000 SCDs Start: 11/11/21 1525  Code Status: Full code  Family Communication:  Spoke with the patient's  nephew at bedside on 11/15/21, spoke with the patient's son on the phone on 11/17/2021 and updated him about the clinical condition of the patient.  Status is: Inpatient  Remains inpatient appropriate because:  Hip fracture status postsurgical intervention, need for skilled nursing facility placement.  Consultants: Orthopedics  Procedures: Right hip cephalomedullary nailing on 11/13/2021  Anti-infectives:  Rocephin IV 12/27>  Anti-infectives (From admission, onward)    Start     Dose/Rate Route Frequency Ordered Stop   11/16/21 1200  sulfamethoxazole-trimethoprim (BACTRIM DS) 800-160 MG per tablet 1 tablet  Status:  Discontinued        1 tablet Oral Every 12 hours 11/16/21 1107 11/16/21 1113   11/16/21 1200  cephALEXin (KEFLEX) capsule 250 mg        250 mg Oral Every 12 hours 11/16/21 1115     11/14/21 0000  ceFAZolin (ANCEF) IVPB 2g/100 mL premix        2 g 200 mL/hr over 30 Minutes Intravenous  Once 11/13/21 1857 11/14/21 0130   11/13/21 0600  ceFAZolin (ANCEF) IVPB 2g/100 mL premix        2 g 200 mL/hr over 30 Minutes Intravenous On call to O.R. 11/11/21 1425 11/13/21 1653   11/12/21 1500  cefTRIAXone (ROCEPHIN) 1 g in sodium chloride 0.9 % 100 mL IVPB  Status:  Discontinued        1 g 200 mL/hr over 30 Minutes Intravenous Every 24 hours 11/12/21 1210 11/16/21 1113      Subjective: Today, patient was seen and examined at bedside.  Inquiring about food being cold and wishes to  warm, more communicative alert and awake.   Objective: Vitals:   11/16/21 2032 11/17/21 0644  BP: 119/67 124/61  Pulse: 74 68  Resp: 18 15  Temp: 99.5 F (37.5 C) 98 F (36.7 C)  SpO2: 93% 100%    Intake/Output Summary (Last 24 hours) at 11/17/2021 1109 Last data filed at 11/17/2021 0800 Gross per 24 hour  Intake 870 ml  Output 500 ml  Net 370 ml    Filed Weights   11/11/21 0930  Weight: 49.9 kg   Body mass index is 22.99 kg/m.   Physical Exam:  General:  Average built, not in obvious distress, alert awake and communicative HENT:   No scleral pallor or icterus noted. Oral mucosa is moist.  Chest:  Clear breath sounds.  Diminished breath sounds bilaterally. No crackles or wheezes.  CVS: S1 &S2 heard. No murmur.  Regular rate and rhythm. Abdomen: Soft, nontender, nondistended.  Bowel sounds are heard.   Extremities: No cyanosis, clubbing or edema.  Peripheral pulses are palpable.  Right hip with dressing. Psych: Alert, awake and communicative, oriented to place and person, confused at times normal mood CNS:  No cranial nerve deficits.  Moving all extremities Skin: Warm and dry.  No rashes noted.  Right hip site with dressing.  Data Review: I have personally reviewed the following laboratory data and studies,  CBC: Recent Labs  Lab 11/11/21 2115 11/12/21 0408 11/14/21 0359 11/16/21 0303  WBC 9.9 9.9 8.5 7.3  NEUTROABS 7.4  --   --   --   HGB 12.7 12.4 9.4* 8.9*  HCT 40.0 38.5 30.3* 27.4*  MCV 97.3 95.5 99.0 95.8  PLT 185 159 124* 151    Basic Metabolic Panel: Recent Labs  Lab 11/11/21 2115 11/12/21 0408 11/14/21 0359 11/16/21 0303  NA 146* 143 144 142  K 4.8 4.2 3.8 4.1  CL 109 107 107 108  CO2 30 29 27 30   GLUCOSE 124* 125* 132* 110*  BUN 18 18 25* 22  CREATININE 0.94 0.81 0.83 0.72  CALCIUM 8.9 8.7* 8.2* 8.0*  MG  --   --  2.4  --     Liver Function Tests: Recent Labs  Lab 11/11/21 2115 11/12/21 0408  AST 48* 54*  ALT 28 28  ALKPHOS 40 41   BILITOT 1.3* 1.3*  PROT 6.7 6.7  ALBUMIN 3.7 3.4*    No results for input(s): LIPASE, AMYLASE in the last 168 hours. No results for input(s): AMMONIA in the last 168 hours. Cardiac Enzymes: No results for input(s): CKTOTAL, CKMB, CKMBINDEX, TROPONINI in the last 168 hours. BNP (last 3 results) No results for input(s): BNP in the last 8760 hours.  ProBNP (last 3 results) No results for input(s): PROBNP in the last 8760 hours.  CBG: No results for input(s): GLUCAP in the last 168 hours. Recent Results (from the past 240 hour(s))  Surgical pcr screen     Status: None   Collection Time: 11/12/21  3:37 AM   Specimen: Nasal Mucosa; Nasal Swab  Result Value Ref Range Status   MRSA, PCR NEGATIVE NEGATIVE Final   Staphylococcus aureus NEGATIVE NEGATIVE Final  Comment: (NOTE) The Xpert SA Assay (FDA approved for NASAL specimens in patients 85 years of age and older), is one component of a comprehensive surveillance program. It is not intended to diagnose infection nor to guide or monitor treatment. Performed at [facility], [place] [address] [place], [place] [address]   Resp Panel by RT-PCR (Flu A&B, Covid) Nasopharyngeal Swab     Status: Abnormal   Collection Time: 11/12/21  4:05 AM   Specimen: Nasopharyngeal Swab; Nasopharyngeal(NP) swabs in vial transport medium  Result Value Ref Range Status   SARS Coronavirus 2 by RT PCR POSITIVE (A) NEGATIVE Final    Comment: (NOTE) SARS-CoV-2 target nucleic acids are DETECTED.  The SARS-CoV-2 RNA is generally detectable in upper respiratory specimens during the acute phase of infection. Positive results are indicative of the presence of the identified virus, but do not rule out bacterial infection or co-infection with other pathogens not detected by the test. Clinical correlation with patient history and other diagnostic information is necessary to determine patient infection status. The expected result is  Negative.  Fact Sheet for Patients: EntrepreneurPulse.com.au  Fact Sheet for Healthcare Providers: IncredibleEmployment.be  This test is not yet approved or cleared by the [place] FDA and  has been authorized for detection and/or diagnosis of SARS-CoV-2 by FDA under an Emergency Use Authorization (EUA).  This EUA will remain in effect (meaning this test can be used) for the duration of  the COVID-19 declaration under Section 564(b)(1) of the A ct, 21 U.S.C. section 360bbb-3(b)(1), unless the authorization is terminated or revoked sooner.     Influenza A by PCR NEGATIVE NEGATIVE Final   Influenza B by PCR NEGATIVE NEGATIVE Final    Comment: (NOTE) The Xpert Xpress SARS-CoV-2/FLU/RSV plus assay is intended as an aid in the diagnosis of influenza from Nasopharyngeal swab specimens and should not be used as a sole basis for treatment. Nasal washings and aspirates are unacceptable for Xpert Xpress SARS-CoV-2/FLU/RSV testing.  Fact Sheet for Patients: EntrepreneurPulse.com.au  Fact Sheet for Healthcare Providers: IncredibleEmployment.be  This test is not yet approved or cleared by the [place] FDA and has been authorized for detection and/or diagnosis of SARS-CoV-2 by FDA under an Emergency Use Authorization (EUA). This EUA will remain in effect (meaning this test can be used) for the duration of the COVID-19 declaration under Section 564(b)(1) of the Act, 21 U.S.C. section 360bbb-3(b)(1), unless the authorization is terminated or revoked.  Performed at [facility], [place] [address] [place], [place] [address]       Studies: No results found.    Flora Lipps, MD  [facility] 11/17/2021  If 7PM-7AM, please contact night-coverage

## 2021-11-18 DIAGNOSIS — K5900 Constipation, unspecified: Secondary | ICD-10-CM | POA: Diagnosis not present

## 2021-11-18 DIAGNOSIS — I5032 Chronic diastolic (congestive) heart failure: Secondary | ICD-10-CM | POA: Diagnosis not present

## 2021-11-18 DIAGNOSIS — F411 Generalized anxiety disorder: Secondary | ICD-10-CM | POA: Diagnosis not present

## 2021-11-18 DIAGNOSIS — S72001A Fracture of unspecified part of neck of right femur, initial encounter for closed fracture: Secondary | ICD-10-CM | POA: Diagnosis not present

## 2021-11-18 MED ORDER — FAMOTIDINE 20 MG PO TABS
20.0000 mg | ORAL_TABLET | Freq: Every day | ORAL | Status: DC
  Administered 2021-11-19 – 2021-11-26 (×8): 20 mg via ORAL
  Filled 2021-11-18 (×8): qty 1

## 2021-11-18 MED ORDER — BISACODYL 10 MG RE SUPP
10.0000 mg | Freq: Once | RECTAL | Status: AC
  Administered 2021-11-18: 10 mg via RECTAL
  Filled 2021-11-18: qty 1

## 2021-11-18 NOTE — Progress Notes (Signed)
PROGRESS NOTE  Heather [name] UDJ:[id number] DOB: [birth year] DOA: 11/11/2021 PCP: Sandrea Hughs, NP   LOS: 7 days   Brief narrative:  Heather [name] is a 88 y.o. female with past medical history of anxiety, hypertension, diastolic congestive heart failure, baseline confusion and hypertension, GERD, was brought into the hospital by EMS after sustaining a fall at home after she lost her balance. Patient had a mechanical fall and was noted to have a displaced fracture of the right femoral neck.  Initial vitals were stable.  Patient was then admitted to the hospital for further evaluation and treatment.  Assessment/Plan:  Principal Problem:   Closed displaced fracture of right femoral neck (HCC) Active Problems:   Anxiety state   Primary hypertension   Diastolic CHF ([place])   GERD without esophagitis   Constipation   Restlessness   Closed right intertrochanteric hip fracture. Patient underwent right cephalomedullary nailing on 11/13/2021.  Physical therapy has been consulted who recommended skilled nursing facility placement for rehabilitation.  Plan is to ollow-up with orthopedics as outpatient in 2 weeks for staple removal.  Weightbearing as tolerated.  Continue Lovenox subq for DVT prophylaxis.  Restlessness, confusional state. Improved.  Mild confusion at baseline.  More alert awake today     Anxiety state Continue risperidone and Haldol.  Try to avoid benzodiazepines.  Essential hypertension On Coreg and oral Lasix.  Continue with holding parameters.  Chronic diastolic CHF  Continue Coreg Lasix with holding parameters. Compensated at this time   GERD without esophagitis On Pepcid. Constipation Continue MiraLAX and stool softeners.  Add Dulcolax suppository.  Still complains of constipation today.  Incidental COVID-positive.   No evidence of hypoxia.  We will hold off with any treatment.  Supportive care.  Abnormal urinalysis with confusion disorientation.   Possibility of UTI.   on Keflex secondary to IV refusal.  Disposition.  Physical therapy has recommended skilled nursing facility placement.  Since patient is COVID-positive, awaiting for 10-day period.  Medically stable for disposition.  DVT prophylaxis: enoxaparin (LOVENOX) injection 30 mg Start: 11/14/21 1000 SCDs Start: 11/11/21 1525  Code Status: Full code  Family Communication:  son on the phone on 11/17/2021 and updated him about the clinical condition of the patient.  Status is: Inpatient  Remains inpatient appropriate because:  Hip fracture status postsurgical intervention, need for skilled nursing facility placement.  Consultants: Orthopedics  Procedures: Right hip cephalomedullary nailing on 11/13/2021  Anti-infectives:  Rocephin IV 12/27>  Anti-infectives (From admission, onward)    Start     Dose/Rate Route Frequency Ordered Stop   11/16/21 1200  sulfamethoxazole-trimethoprim (BACTRIM DS) 800-160 MG per tablet 1 tablet  Status:  Discontinued        1 tablet Oral Every 12 hours 11/16/21 1107 11/16/21 1113   11/16/21 1200  cephALEXin (KEFLEX) capsule 250 mg        250 mg Oral Every 12 hours 11/16/21 1115     11/14/21 0000  ceFAZolin (ANCEF) IVPB 2g/100 mL premix        2 g 200 mL/hr over 30 Minutes Intravenous  Once 11/13/21 1857 11/14/21 0130   11/13/21 0600  ceFAZolin (ANCEF) IVPB 2g/100 mL premix        2 g 200 mL/hr over 30 Minutes Intravenous On call to O.R. 11/11/21 1425 11/13/21 1653   11/12/21 1500  cefTRIAXone (ROCEPHIN) 1 g in sodium chloride 0.9 % 100 mL IVPB  Status:  Discontinued        1 g 200 mL/hr  over 30 Minutes Intravenous Every 24 hours 11/12/21 1210 11/16/21 1113      Subjective: Today, patient was seen and examined at bedside.  Complains of constipation.  Denies nausea vomiting fever pain.  Poor historian.   Objective: Vitals:   11/17/21 2119 11/18/21 0646  BP: (!) 100/52 121/65  Pulse: 86 76  Resp: 16 16  Temp: 98.7 F (37.1 C)  97.8 F (36.6 C)  SpO2: 95% 97%    Intake/Output Summary (Last 24 hours) at 11/18/2021 1338 Last data filed at 11/18/2021 0646 Gross per 24 hour  Intake 120 ml  Output 850 ml  Net -730 ml    Filed Weights   11/11/21 0930  Weight: 49.9 kg   Body mass index is 22.99 kg/m.   Physical Exam: General:  Average built, not in obvious distress, alert awake and communicative, elderly female HENT:   No scleral pallor or icterus noted. Oral mucosa is moist.  Chest: Diminished breath sounds bilaterally. CVS: S1 &S2 heard. No murmur.  Regular rate and rhythm. Abdomen: Soft, nontender, nondistended.  Bowel sounds are heard.   Extremities: No cyanosis, clubbing or edema.  Peripheral pulses are palpable.  Right hip with dressing. Psych: Alert, awake and communicative, oriented to place and person, confused at times, normal mood CNS:  No cranial nerve deficits.  Moving all extremities Skin: Warm and dry.  No rashes noted.  Right hip site with dressing.  Data Review: I have personally reviewed the following laboratory data and studies,  CBC: Recent Labs  Lab 11/11/21 2115 11/12/21 0408 11/14/21 0359 11/16/21 0303  WBC 9.9 9.9 8.5 7.3  NEUTROABS 7.4  --   --   --   HGB 12.7 12.4 9.4* 8.9*  HCT 40.0 38.5 30.3* 27.4*  MCV 97.3 95.5 99.0 95.8  PLT 185 159 124* 229    Basic Metabolic Panel: Recent Labs  Lab 11/11/21 2115 11/12/21 0408 11/14/21 0359 11/16/21 0303  NA 146* 143 144 142  K 4.8 4.2 3.8 4.1  CL 109 107 107 108  CO2 30 29 27 30   GLUCOSE 124* 125* 132* 110*  BUN 18 18 25* 22  CREATININE 0.94 0.81 0.83 0.72  CALCIUM 8.9 8.7* 8.2* 8.0*  MG  --   --  2.4  --     Liver Function Tests: Recent Labs  Lab 11/11/21 2115 11/12/21 0408  AST 48* 54*  ALT 28 28  ALKPHOS 40 41  BILITOT 1.3* 1.3*  PROT 6.7 6.7  ALBUMIN 3.7 3.4*    No results for input(s): LIPASE, AMYLASE in the last 168 hours. No results for input(s): AMMONIA in the last 168 hours. Cardiac Enzymes: No  results for input(s): CKTOTAL, CKMB, CKMBINDEX, TROPONINI in the last 168 hours. BNP (last 3 results) No results for input(s): BNP in the last 8760 hours.  ProBNP (last 3 results) No results for input(s): PROBNP in the last 8760 hours.  CBG: No results for input(s): GLUCAP in the last 168 hours. Recent Results (from the past 240 hour(s))  Surgical pcr screen     Status: None   Collection Time: 11/12/21  3:37 AM   Specimen: Nasal Mucosa; Nasal Swab  Result Value Ref Range Status   MRSA, PCR NEGATIVE NEGATIVE Final   Staphylococcus aureus NEGATIVE NEGATIVE Final    Comment: (NOTE) The Xpert SA Assay (FDA approved for NASAL specimens in patients 44 years of age and older), is one component of a comprehensive surveillance program. It is not intended to diagnose infection nor  to guide or monitor treatment. Performed at [facility], [place] [address] [place], [place] [address]   Resp Panel by RT-PCR (Flu A&B, Covid) Nasopharyngeal Swab     Status: Abnormal   Collection Time: 11/12/21  4:05 AM   Specimen: Nasopharyngeal Swab; Nasopharyngeal(NP) swabs in vial transport medium  Result Value Ref Range Status   SARS Coronavirus 2 by RT PCR POSITIVE (A) NEGATIVE Final    Comment: (NOTE) SARS-CoV-2 target nucleic acids are DETECTED.  The SARS-CoV-2 RNA is generally detectable in upper respiratory specimens during the acute phase of infection. Positive results are indicative of the presence of the identified virus, but do not rule out bacterial infection or co-infection with other pathogens not detected by the test. Clinical correlation with patient history and other diagnostic information is necessary to determine patient infection status. The expected result is Negative.  Fact Sheet for Patients: EntrepreneurPulse.com.au  Fact Sheet for Healthcare Providers: IncredibleEmployment.be  This test is not yet approved or cleared by the  [place] FDA and  has been authorized for detection and/or diagnosis of SARS-CoV-2 by FDA under an Emergency Use Authorization (EUA).  This EUA will remain in effect (meaning this test can be used) for the duration of  the COVID-19 declaration under Section 564(b)(1) of the A ct, 21 U.S.C. section 360bbb-3(b)(1), unless the authorization is terminated or revoked sooner.     Influenza A by PCR NEGATIVE NEGATIVE Final   Influenza B by PCR NEGATIVE NEGATIVE Final    Comment: (NOTE) The Xpert Xpress SARS-CoV-2/FLU/RSV plus assay is intended as an aid in the diagnosis of influenza from Nasopharyngeal swab specimens and should not be used as a sole basis for treatment. Nasal washings and aspirates are unacceptable for Xpert Xpress SARS-CoV-2/FLU/RSV testing.  Fact Sheet for Patients: EntrepreneurPulse.com.au  Fact Sheet for Healthcare Providers: IncredibleEmployment.be  This test is not yet approved or cleared by the [place] FDA and has been authorized for detection and/or diagnosis of SARS-CoV-2 by FDA under an Emergency Use Authorization (EUA). This EUA will remain in effect (meaning this test can be used) for the duration of the COVID-19 declaration under Section 564(b)(1) of the Act, 21 U.S.C. section 360bbb-3(b)(1), unless the authorization is terminated or revoked.  Performed at [facility], [place] [address] [place], [place] [address]       Studies: No results found.    Flora Lipps, MD  [facility] 11/18/2021  If 7PM-7AM, please contact night-coverage

## 2021-11-18 NOTE — Progress Notes (Signed)
Patient very boisterous and irritable, refusing to take HS meds.  Attempted to explain meds, especially twice daily meds, and the patient was very adamant that she had already taken her meds.  I attempted x 4 to get her to take the meds, explaining why I was the one giving her meds tonight, and that no one else was giving medication tonight.  She frequently stated, " I've already taken all the medicine I'm gonna take tonight and I'm not taking anything else.  Further attempts were met with the same resistance.

## 2021-11-18 NOTE — Plan of Care (Signed)
Plan of care reviewed. 

## 2021-11-18 NOTE — Consult Note (Signed)
WOC Nurse Consult Note: Reason for Consult: DTPI to left heel, DTPI in evolution to coccyx Wound type:Pressure Pressure Injury POA: Yes Measurement:Per Nursing Flow Sheet  Left heel: 4cm x 2cm  Coccyx: 4cm x 3cm x 0.1cm Wound bed: Left heel: Purple discoloration, intact Coccyx: Purple, sloughing skin loss area is red tissue Drainage (amount, consistency, odor) serous Periwound: intact Dressing procedure/placement/frequency: Patient is able to turn and reposition in bed and has been up with PT. I will provide bilateral pressure redistribution heel boots and a pressure redistribution chair cushion for the times when she is OOB to chair. If immobility is a concern, we can certain order a mattress replacement with low air loss feature while she is in bed. Please reconsult if that is the case.  Discussed POC with Bedside RN, J. Lovena Le.  [place] nursing team will not follow, but will remain available to this patient, the nursing and medical teams.  Please re-consult if needed. Thanks, Maudie Flakes, MSN, RN, [place], Arther Abbott  Pager# [phone]

## 2021-11-19 DIAGNOSIS — S72001A Fracture of unspecified part of neck of right femur, initial encounter for closed fracture: Secondary | ICD-10-CM | POA: Diagnosis not present

## 2021-11-19 DIAGNOSIS — K219 Gastro-esophageal reflux disease without esophagitis: Secondary | ICD-10-CM | POA: Diagnosis not present

## 2021-11-19 DIAGNOSIS — K5900 Constipation, unspecified: Secondary | ICD-10-CM | POA: Diagnosis not present

## 2021-11-19 DIAGNOSIS — L8990 Pressure ulcer of unspecified site, unspecified stage: Secondary | ICD-10-CM | POA: Diagnosis present

## 2021-11-19 DIAGNOSIS — I5032 Chronic diastolic (congestive) heart failure: Secondary | ICD-10-CM | POA: Diagnosis not present

## 2021-11-19 MED ORDER — OXYCODONE-ACETAMINOPHEN 5-325 MG PO TABS: 1.0000 | ORAL_TABLET | Freq: Four times a day (QID) | ORAL | Status: DC | PRN

## 2021-11-19 MED ORDER — JUVEN PO PACK
1.0000 | PACK | Freq: Two times a day (BID) | ORAL | Status: DC
  Administered 2021-11-19 – 2021-11-23 (×8): 1 via ORAL
  Filled 2021-11-19 (×16): qty 1

## 2021-11-19 MED ORDER — SODIUM CHLORIDE 0.9 % IV SOLN: INTRAVENOUS | Status: AC

## 2021-11-19 MED ORDER — SENNOSIDES-DOCUSATE SODIUM 8.6-50 MG PO TABS
2.0000 | ORAL_TABLET | Freq: Every day | ORAL | Status: DC
  Administered 2021-11-20: 2 via ORAL
  Filled 2021-11-19: qty 2

## 2021-11-19 NOTE — Progress Notes (Signed)
Physical Therapy Treatment Patient Details Name: Heather [name] MRN: [id number] DOB: [birth year] Today's Date: 11/19/2021   History of Present Illness 88 y.o. female with past medical history of anxiety, hypertension, diastolic congestive heart failure, GERD, was brought into the hospital by EMS after sustaining a fall at home after she lost her balance patient had a mechanical fall and at  baseline is confused and delirious. Dx of R femoral neck fx, s/p IM nail 11/13/21, Covid positive.    PT Comments    Pt agreeable to working with PT. She was more participatory on today. She was Mod A +2 for mobility (bed mobility, transfers). Unfortunately, she became hypotensive after getting over to [facility]. BP 75/59 Total A to get pt back to bed. End of session BP 104/62.    Recommendations for follow up therapy are one component of a multi-disciplinary discharge planning process, led by the attending physician.  Recommendations may be updated based on patient status, additional functional criteria and insurance authorization.  Follow Up Recommendations  Skilled nursing-short term rehab (<3 hours/day)     Assistance Recommended at Discharge Frequent or constant Supervision/Assistance  Patient can return home with the following A lot of help with walking and/or transfers;A lot of help with bathing/dressing/bathroom   Equipment Recommendations  None recommended by PT    Recommendations for Other Services       Precautions / Restrictions Precautions Precautions: Fall Restrictions Weight Bearing Restrictions: No RLE Weight Bearing: Weight bearing as tolerated     Mobility  Bed Mobility Overal bed mobility: Needs Assistance Bed Mobility: Supine to Sit;Sit to Supine     Supine to sit: Mod assist;+2 for physical assistance;+2 for safety/equipment;HOB elevated Sit to supine: Total assist;+2 for physical assistance;+2 for safety/equipment;HOB elevated   General bed mobility comments: Total  assist for sit to supine 2* drop in BP while sitting on BSC.    Transfers Overall transfer level: Needs assistance Equipment used: Rolling walker (2 wheels) Transfers: Sit to/from Stand Sit to Stand: Mod assist;+2 physical assistance;+2 safety/equipment;From elevated surface Stand pivot transfers: Mod assist;+2 physical assistance;+2 safety/equipment Squat pivot transfers: Total assist;+2 physical assistance;+2 safety/equipment       General transfer comment: Assist to power up, stabilize, control descent. Multimodal cueing required. x 2. Able to shuffle over to bsc-flexed trunk and bil knee posture. Pt c/o bil knee pain. Total assist for sq pivot back to bed 2* pt had drop in BP while sitting on BSC-75/59    Ambulation/Gait                   Stairs             Wheelchair Mobility    Modified Rankin (Stroke Patients Only)       Balance Overall balance assessment: Needs assistance;History of Falls Sitting-balance support: Feet supported;Bilateral upper extremity supported Sitting balance-Leahy Scale: Fair     Standing balance support: Bilateral upper extremity supported;Reliant on assistive device for balance Standing balance-Leahy Scale: Poor                              Cognition Arousal/Alertness: Awake/alert Behavior During Therapy: WFL for tasks assessed/performed Overall Cognitive Status: History of cognitive impairments - at baseline                                 General Comments: more cooperative. follows commands with  time        Exercises      General Comments        Pertinent Vitals/Pain Pain Assessment: Faces Faces Pain Scale: Hurts little more Pain Location: R hip with movement Pain Descriptors / Indicators: Grimacing;Guarding Pain Intervention(s): Limited activity within patient's tolerance;Monitored during session;Repositioned    Home Living                          Prior Function             PT Goals (current goals can now be found in the care plan section) Progress towards PT goals: Progressing toward goals    Frequency    Min 3X/week      PT Plan Current plan remains appropriate    Co-evaluation              AM-PAC PT "6 Clicks" Mobility   Outcome Measure  Help needed turning from your back to your side while in a flat bed without using bedrails?: Total Help needed moving from lying on your back to sitting on the side of a flat bed without using bedrails?: Total Help needed moving to and from a bed to a chair (including a wheelchair)?: Total Help needed standing up from a chair using your arms (e.g., wheelchair or bedside chair)?: Total Help needed to walk in hospital room?: Total Help needed climbing 3-5 steps with a railing? : Total 6 Click Score: 6    End of Session Equipment Utilized During Treatment: Gait belt Activity Tolerance: Treatment limited secondary to medical complications (Comment) (orthostatic hypotension) Patient left: in bed;with call bell/phone within reach;with bed alarm set   PT Visit Diagnosis: Difficulty in walking, not elsewhere classified (R26.2);Pain;History of falling (Z91.81) Pain - Right/Left: Right Pain - part of body: Hip     Time: 5697-9480 PT Time Calculation (min) (ACUTE ONLY): 47 min  Charges:  $Gait Training: 8-22 mins $Therapeutic Activity: 23-37 mins                         Doreatha Massed, PT Acute Rehabilitation  Office: [phone] Pager: [phone]

## 2021-11-19 NOTE — Progress Notes (Signed)
PROGRESS NOTE  [name] AJO:[id number] DOB: 1918-10-19 DOA: 11/11/2021 PCP: Sandrea Hughs, NP   LOS: 8 days   Brief narrative:  Heather [name] is a 88 y.o. female with past medical history of anxiety, hypertension, diastolic congestive heart failure, baseline confusion and hypertension, GERD, was brought into the hospital by EMS after sustaining a fall at home after she lost her balance. Patient had a mechanical fall and was noted to have a displaced fracture of the right femoral neck.  Initial vitals were stable.  Patient was then admitted to the hospital for further evaluation and treatment.  Assessment/Plan:  Principal Problem:   Closed displaced fracture of right femoral neck (HCC) Active Problems:   Anxiety state   Primary hypertension   Diastolic CHF ([place])   GERD without esophagitis   Constipation   Restlessness   Pressure injury of skin   Closed right intertrochanteric hip fracture. Patient underwent right cephalomedullary nailing on 11/13/2021.  Physical therapy has been consulted who recommended skilled nursing facility placement for rehabilitation.  Plan is to ollow-up with orthopedics as outpatient in 2 weeks for staple removal.  Weightbearing as tolerated.  Continue Lovenox subq for DVT prophylaxis.  Restlessness, confusional state. Improved.  Mild confusion at baseline.     Anxiety state Continue risperidone and Haldol. Avoid benzodiazepines.  Essential hypertension On Coreg and oral Lasix.  Continue with holding parameters.  Chronic diastolic CHF  Continue Coreg Lasix with holding parameters. Compensated at this time   GERD without esophagitis On Pepcid.  Constipation Continue MiraLAX and stool softeners.   Incidental COVID-positive.   No evidence of hypoxia.  We will hold off with any treatment.  Supportive care.  Abnormal urinalysis with confusion disorientation.  Possibility of UTI.   on Keflex secondary to IV refusal.  Disposition.   Physical therapy has recommended skilled nursing facility placement.  Since patient is COVID-positive, awaiting for 10-day period.  Medically stable for disposition.  DVT prophylaxis: enoxaparin (LOVENOX) injection 30 mg Start: 11/14/21 1000 SCDs Start: 11/11/21 1525  Code Status: Full code  Family Communication:  son on the phone on 11/17/2021  Status is: Inpatient  Remains inpatient appropriate because:  Hip fracture status postsurgical intervention, need for skilled nursing facility placement.  Consultants: Orthopedics  Procedures: Right hip cephalomedullary nailing on 11/13/2021  Anti-infectives:  Rocephin IV 12/27>  Anti-infectives (From admission, onward)    Start     Dose/Rate Route Frequency Ordered Stop   11/16/21 1200  sulfamethoxazole-trimethoprim (BACTRIM DS) 800-160 MG per tablet 1 tablet  Status:  Discontinued        1 tablet Oral Every 12 hours 11/16/21 1107 11/16/21 1113   11/16/21 1200  cephALEXin (KEFLEX) capsule 250 mg        250 mg Oral Every 12 hours 11/16/21 1115     11/14/21 0000  ceFAZolin (ANCEF) IVPB 2g/100 mL premix        2 g 200 mL/hr over 30 Minutes Intravenous  Once 11/13/21 1857 11/14/21 0130   11/13/21 0600  ceFAZolin (ANCEF) IVPB 2g/100 mL premix        2 g 200 mL/hr over 30 Minutes Intravenous On call to O.R. 11/11/21 1425 11/13/21 1653   11/12/21 1500  cefTRIAXone (ROCEPHIN) 1 g in sodium chloride 0.9 % 100 mL IVPB  Status:  Discontinued        1 g 200 mL/hr over 30 Minutes Intravenous Every 24 hours 11/12/21 1210 11/16/21 1113      Subjective:  Patient was  seen and examined at bedside., patient states that she feels okay.  Denies any pain, nausea, vomiting. Objective: Vitals:   11/19/21 0503 11/19/21 1327  BP: 112/60 (!) 97/51  Pulse: 77 79  Resp: 17 20  Temp: 98.7 F (37.1 C) 98.7 F (37.1 C)  SpO2: 97% 96%    Intake/Output Summary (Last 24 hours) at 11/19/2021 1403 Last data filed at 11/19/2021 1315 Gross per 24 hour   Intake 330 ml  Output 800 ml  Net -470 ml    Filed Weights   11/11/21 0930  Weight: 49.9 kg   Body mass index is 22.99 kg/m.   Physical Exam: General:  Average built, not in obvious distress but awake and communicative, elderly female HENT:   No scleral pallor or icterus noted. Oral mucosa is moist.  Chest:  Clear breath sounds.  Diminished breath sounds bilaterally.  CVS: S1 &S2 heard. No murmur.  Regular rate and rhythm. Abdomen: Soft, nontender, nondistended.  Bowel sounds are heard.   Extremities: No cyanosis, clubbing or edema.  Peripheral pulses are palpable. Psych: Alert, awake and oriented to person and place., normal mood CNS:  No cranial nerve deficits.  Moves all the extremities. Skin: Warm and dry.  Right hip surgical site healthy.   Data Review: I have personally reviewed the following laboratory data and studies,  CBC: Recent Labs  Lab 11/14/21 0359 11/16/21 0303  WBC 8.5 7.3  HGB 9.4* 8.9*  HCT 30.3* 27.4*  MCV 99.0 95.8  PLT 124* 427    Basic Metabolic Panel: Recent Labs  Lab 11/14/21 0359 11/16/21 0303  NA 144 142  K 3.8 4.1  CL 107 108  CO2 27 30  GLUCOSE 132* 110*  BUN 25* 22  CREATININE 0.83 0.72  CALCIUM 8.2* 8.0*  MG 2.4  --     Liver Function Tests: No results for input(s): AST, ALT, ALKPHOS, BILITOT, PROT, ALBUMIN in the last 168 hours.  No results for input(s): LIPASE, AMYLASE in the last 168 hours. No results for input(s): AMMONIA in the last 168 hours. Cardiac Enzymes: No results for input(s): CKTOTAL, CKMB, CKMBINDEX, TROPONINI in the last 168 hours. BNP (last 3 results) No results for input(s): BNP in the last 8760 hours.  ProBNP (last 3 results) No results for input(s): PROBNP in the last 8760 hours.  CBG: No results for input(s): GLUCAP in the last 168 hours. Recent Results (from the past 240 hour(s))  Surgical pcr screen     Status: None   Collection Time: 11/12/21  3:37 AM   Specimen: Nasal Mucosa; Nasal Swab   Result Value Ref Range Status   MRSA, PCR NEGATIVE NEGATIVE Final   Staphylococcus aureus NEGATIVE NEGATIVE Final    Comment: (NOTE) The Xpert SA Assay (FDA approved for NASAL specimens in patients 17 years of age and older), is one component of a comprehensive surveillance program. It is not intended to diagnose infection nor to guide or monitor treatment. Performed at [facility], [place] [address], [place], [place] [address]   Resp Panel by RT-PCR (Flu A&B, Covid) Nasopharyngeal Swab     Status: Abnormal   Collection Time: 11/12/21  4:05 AM   Specimen: Nasopharyngeal Swab; Nasopharyngeal(NP) swabs in vial transport medium  Result Value Ref Range Status   SARS Coronavirus 2 by RT PCR POSITIVE (A) NEGATIVE Final    Comment: (NOTE) SARS-CoV-2 target nucleic acids are DETECTED.  The SARS-CoV-2 RNA is generally detectable in upper respiratory specimens during the acute phase of  infection. Positive results are indicative of the presence of the identified virus, but do not rule out bacterial infection or co-infection with other pathogens not detected by the test. Clinical correlation with patient history and other diagnostic information is necessary to determine patient infection status. The expected result is Negative.  Fact Sheet for Patients: EntrepreneurPulse.com.au  Fact Sheet for Healthcare Providers: IncredibleEmployment.be  This test is not yet approved or cleared by the [place] FDA and  has been authorized for detection and/or diagnosis of SARS-CoV-2 by FDA under an Emergency Use Authorization (EUA).  This EUA will remain in effect (meaning this test can be used) for the duration of  the COVID-19 declaration under Section 564(b)(1) of the A ct, 21 U.S.C. section 360bbb-3(b)(1), unless the authorization is terminated or revoked sooner.     Influenza A by PCR NEGATIVE NEGATIVE Final   Influenza B by PCR NEGATIVE  NEGATIVE Final    Comment: (NOTE) The Xpert Xpress SARS-CoV-2/FLU/RSV plus assay is intended as an aid in the diagnosis of influenza from Nasopharyngeal swab specimens and should not be used as a sole basis for treatment. Nasal washings and aspirates are unacceptable for Xpert Xpress SARS-CoV-2/FLU/RSV testing.  Fact Sheet for Patients: EntrepreneurPulse.com.au  Fact Sheet for Healthcare Providers: IncredibleEmployment.be  This test is not yet approved or cleared by the [place] FDA and has been authorized for detection and/or diagnosis of SARS-CoV-2 by FDA under an Emergency Use Authorization (EUA). This EUA will remain in effect (meaning this test can be used) for the duration of the COVID-19 declaration under Section 564(b)(1) of the Act, 21 U.S.C. section 360bbb-3(b)(1), unless the authorization is terminated or revoked.  Performed at [facility], [place] [address], [place], [place] [address]       Studies: No results found.    Flora Lipps, MD  [facility] 11/19/2021  If 7PM-7AM, please contact night-coverage

## 2021-11-19 NOTE — Plan of Care (Signed)
Plan of care reviewed. 

## 2021-11-19 NOTE — Progress Notes (Signed)
Nutrition Follow-up  DOCUMENTATION CODES:   Not applicable  INTERVENTION:  - continue Ensure Enlive BID. - will add 1 packet Juven BID, each packet provides 95 calories, 2.5 grams of protein (collagen), and 9.8 grams of carbohydrate (3 grams sugar); also contains 7 grams of L-arginine and L-glutamine, 300 mg vitamin C, 15 mg vitamin E, 1.2 mcg vitamin B-12, 9.5 mg zinc, 200 mg calcium, and 1.5 g  Calcium Beta-hydroxy-Beta-methylbutyrate to support wound healing. - weigh patient today.    NUTRITION DIAGNOSIS:   Increased nutrient needs related to hip fracture as evidenced by estimated needs. -ongoing  GOAL:   Patient will meet greater than or equal to 90% of their needs -unmet on average  MONITOR:   PO intake, Supplement acceptance, Labs, Weight trends, I & O's  ASSESSMENT:   88 yo female with a PMH of anxiety, brease cancer, carpal tunnel syndrome, cercical spondylosis, chronic pain syndrome, diverticulosis, gastritis, HTN, low back pain, LPRD, and osteoporosis who presents with closed displaced fracture of R femoral neck.  She is noted to be a/o to self and situation only. She has been accepting Ensure 90% of the time offered.   Able to talk with RN who checked with Tech and patient ate 100% of breakfast this AM.   She has not been weighed since admission on 12/26. Non-pitting edema to BLE documented in the edema section of flow sheet.   Notes indicate patient is medically stable pending d/c to SNF but was incidentally found to be COVID positive on 12/27 and will require 10 days of isolation prior to being able to d/c.    Labs reviewed; Ca: 8 mg/dl. Medications reviewed; 20 mg oral pepcid/day, 10 mg oral lasix every other day, 1 tablet multivitamin with minerals/day, 17 g miralax/day, 1 tablet senokot BID.   Diet Order:   Diet Order             Diet regular Room service appropriate? Yes; Fluid consistency: Thin  Diet effective now                   EDUCATION  NEEDS:   Not appropriate for education at this time  Skin:  Skin Assessment: Skin Integrity Issues: Skin Integrity Issues:: DTI, Stage II, Incisions DTI: L heel (newly documented 1/2) Stage II: coccyx (newly documented 1/2) Incisions: R hip (newly documented 12/28)  Last BM:  1/2 (type 1)  Height:   Ht Readings from Last 1 Encounters:  11/11/21 4\' 10"  (1.473 m)    Weight:   Wt Readings from Last 1 Encounters:  11/11/21 49.9 kg    Estimated Nutritional Needs:  Kcal:  1400-1600 Protein:  65-80 grams Fluid:  >1.4 L     Jarome Matin, MS, RD, LDN Inpatient Clinical Dietitian RD pager # available in [place]  After hours/weekend pager # available in [facility]

## 2021-11-20 DIAGNOSIS — I5032 Chronic diastolic (congestive) heart failure: Secondary | ICD-10-CM | POA: Diagnosis not present

## 2021-11-20 DIAGNOSIS — F411 Generalized anxiety disorder: Secondary | ICD-10-CM | POA: Diagnosis not present

## 2021-11-20 DIAGNOSIS — S72001A Fracture of unspecified part of neck of right femur, initial encounter for closed fracture: Secondary | ICD-10-CM | POA: Diagnosis not present

## 2021-11-20 DIAGNOSIS — K5900 Constipation, unspecified: Secondary | ICD-10-CM | POA: Diagnosis not present

## 2021-11-20 LAB — BASIC METABOLIC PANEL
Anion gap: 5 (ref 5–15)
BUN: 27 mg/dL — ABNORMAL HIGH (ref 8–23)
CO2: 27 mmol/L (ref 22–32)
Calcium: 8.4 mg/dL — ABNORMAL LOW (ref 8.9–10.3)
Chloride: 105 mmol/L (ref 98–111)
Creatinine, Ser: 0.73 mg/dL (ref 0.44–1.00)
GFR, Estimated: >60 mL/min (ref >60)
Glucose, Bld: 107 mg/dL — ABNORMAL HIGH (ref 70–99)
Potassium: 4.5 mmol/L (ref 3.5–5.1)
Sodium: 137 mmol/L (ref 135–145)

## 2021-11-20 LAB — CBC
HCT: 29.8 % — ABNORMAL LOW (ref 36.0–46.0)
Hemoglobin: 9.5 g/dL — ABNORMAL LOW (ref 12.0–15.0)
MCH: 30.7 pg (ref 26.0–34.0)
MCHC: 31.9 g/dL (ref 30.0–36.0)
MCV: 96.4 fL (ref 80.0–100.0)
Platelets: 376 10*3/uL (ref 150–400)
RBC: 3.09 MIL/uL — ABNORMAL LOW (ref 3.87–5.11)
RDW: 14.1 % (ref 11.5–15.5)
WBC: 6.5 10*3/uL (ref 4.0–10.5)
nRBC: 0 % (ref 0.0–0.2)

## 2021-11-20 LAB — MAGNESIUM: Magnesium: 2.1 mg/dL (ref 1.7–2.4)

## 2021-11-20 LAB — PHOSPHORUS: Phosphorus: 3.2 mg/dL (ref 2.5–4.6)

## 2021-11-20 NOTE — Progress Notes (Signed)
PROGRESS NOTE  [name]:[id number] DOB: [birth year] DOA: 11/11/2021 PCP: Sandrea Hughs, NP   LOS: 9 days   Brief narrative:  Heather [name] is a 88 y.o. female with past medical history of anxiety, hypertension, diastolic congestive heart failure,GERD, was brought into the hospital by EMS after sustaining a fall at home after she lost her balance. Patient had a mechanical fall and was noted to have a displaced fracture of the right femoral neck.  Initial vitals were stable.  Patient was then admitted to the hospital for further evaluation and treatment.  Orthopedics was consulted and patient underwent IM nailing on 11/13/2021.    During hospitalization, patient had confusion agitation and required one-to-one sitter.  Currently awaiting for skilled nursing facility placement her mentation has improved..  Assessment/Plan:  Principal Problem:   Closed displaced fracture of right femoral neck (HCC) Active Problems:   Anxiety state   Primary hypertension   Diastolic CHF ([place])   GERD without esophagitis   Constipation   Restlessness   Pressure injury of skin  Closed right intertrochanteric hip fracture. Patient underwent right cephalomedullary nailing on 11/13/2021.  Physical therapy has recommended skilled nursing facility placement for rehabilitation.  Plan is to ollow-up with orthopedics as outpatient in 2 weeks from the time of surgery for staple removal.  Weightbearing as tolerated.  Continue Lovenox subq for DVT prophylaxis.  Restlessness, confusional state. Improved.  Mild confusion at baseline.     Anxiety state Continue risperidone and Haldol. Avoid benzodiazepines.  Essential hypertension PT noted little orthostatic hypotension yesterday.  Morphine was discontinued.  On Coreg and oral Lasix.  Continue with holding parameters.  Encouraged oral hydration.  Chronic diastolic CHF  Continue Coreg with holding parameters. Compensated at this time.  Lasix has  been discontinued due to hypotension standing   GERD without esophagitis On Pepcid.  Constipation Continue MiraLAX and stool softeners.   Incidental COVID-positive.  Tested positive on 11/12/2021 No evidence of hypoxia.   Supportive care.  Abnormal urinalysis with confusion disorientation.  Possibility of UTI.   Received Keflex secondary to IV refusal.  Completed 3-day course.  Will discontinue.  Disposition.  Physical therapy has recommended skilled nursing facility placement.  Since patient is COVID-positive, awaiting for 10-day period.  TOC on board.  Medically stable for disposition.  DVT prophylaxis: enoxaparin (LOVENOX) injection 30 mg Start: 11/14/21 1000 SCDs Start: 11/11/21 1525  Code Status: Full code  Family Communication:  I spoke with the son on the phone on 11/20/2021 and updated him about the clinical condition of the patient and the potential for disposition to skilled nursing facility in 1 to 2 days   Status is: Inpatient  Remains inpatient appropriate because:  Hip fracture status postsurgical intervention, need for skilled nursing facility placement.  Consultants: Orthopedics Dr Sammuel Hines  Procedures: Right hip cephalomedullary nailing on 11/13/2021  Anti-infectives:  Rocephin IV 12/27>12/31 Keflex 12/31>1/4  Subjective: Patient was seen and examined at bedside.  Patient is more alert awake and communicative.  Denies overt pain nausea vomiting.  Objective: Vitals:   11/20/21 0005 11/20/21 0412  BP: 110/62 (!) 105/56  Pulse: 83 89  Resp: 17 18  Temp: 98.7 F (37.1 C) 98.5 F (36.9 C)  SpO2: 98% 98%    Intake/Output Summary (Last 24 hours) at 11/20/2021 0725 Last data filed at 11/20/2021 0600 Gross per 24 hour  Intake 1370.03 ml  Output 1300 ml  Net 70.03 ml    Filed Weights   11/11/21 0930  Weight:  49.9 kg   Body mass index is 22.99 kg/m.   Physical Exam: General:  Average built, not in obvious distress, elderly female. HENT:   No  scleral pallor or icterus noted. Oral mucosa is moist.  Chest:  Clear breath sounds.  Diminished breath sounds bilaterally. CVS: S1 &S2 heard. No murmur.  Regular rate and rhythm. Abdomen: Soft, nontender, nondistended.  Bowel sounds are heard.   Extremities: No cyanosis, clubbing or edema.  Peripheral pulses are palpable. Psych: Alert, awake and communicative, normal mood CNS:  No cranial nerve deficits.  Moves all extremities. Skin: Warm and dry.  Right hip surgical site healthy.  Data Review: I have personally reviewed the following laboratory data and studies,  CBC: Recent Labs  Lab 11/14/21 0359 11/16/21 0303  WBC 8.5 7.3  HGB 9.4* 8.9*  HCT 30.3* 27.4*  MCV 99.0 95.8  PLT 124* 782    Basic Metabolic Panel: Recent Labs  Lab 11/14/21 0359 11/16/21 0303  NA 144 142  K 3.8 4.1  CL 107 108  CO2 27 30  GLUCOSE 132* 110*  BUN 25* 22  CREATININE 0.83 0.72  CALCIUM 8.2* 8.0*  MG 2.4  --     Liver Function Tests: No results for input(s): AST, ALT, ALKPHOS, BILITOT, PROT, ALBUMIN in the last 168 hours.  No results for input(s): LIPASE, AMYLASE in the last 168 hours. No results for input(s): AMMONIA in the last 168 hours. Cardiac Enzymes: No results for input(s): CKTOTAL, CKMB, CKMBINDEX, TROPONINI in the last 168 hours. BNP (last 3 results) No results for input(s): BNP in the last 8760 hours.  ProBNP (last 3 results) No results for input(s): PROBNP in the last 8760 hours.  CBG: No results for input(s): GLUCAP in the last 168 hours. Recent Results (from the past 240 hour(s))  Surgical pcr screen     Status: None   Collection Time: 11/12/21  3:37 AM   Specimen: Nasal Mucosa; Nasal Swab  Result Value Ref Range Status   MRSA, PCR NEGATIVE NEGATIVE Final   Staphylococcus aureus NEGATIVE NEGATIVE Final    Comment: (NOTE) The Xpert SA Assay (FDA approved for NASAL specimens in patients 42 years of age and older), is one component of a comprehensive surveillance  program. It is not intended to diagnose infection nor to guide or monitor treatment. Performed at [facility], [place] [address], [place], [place] [address]   Resp Panel by RT-PCR (Flu A&B, Covid) Nasopharyngeal Swab     Status: Abnormal   Collection Time: 11/12/21  4:05 AM   Specimen: Nasopharyngeal Swab; Nasopharyngeal(NP) swabs in vial transport medium  Result Value Ref Range Status   SARS Coronavirus 2 by RT PCR POSITIVE (A) NEGATIVE Final    Comment: (NOTE) SARS-CoV-2 target nucleic acids are DETECTED.  The SARS-CoV-2 RNA is generally detectable in upper respiratory specimens during the acute phase of infection. Positive results are indicative of the presence of the identified virus, but do not rule out bacterial infection or co-infection with other pathogens not detected by the test. Clinical correlation with patient history and other diagnostic information is necessary to determine patient infection status. The expected result is Negative.  Fact Sheet for Patients: EntrepreneurPulse.com.au  Fact Sheet for Healthcare Providers: IncredibleEmployment.be  This test is not yet approved or cleared by the [place] FDA and  has been authorized for detection and/or diagnosis of SARS-CoV-2 by FDA under an Emergency Use Authorization (EUA).  This EUA will remain in effect (meaning this test can  be used) for the duration of  the COVID-19 declaration under Section 564(b)(1) of the A ct, 21 U.S.C. section 360bbb-3(b)(1), unless the authorization is terminated or revoked sooner.     Influenza A by PCR NEGATIVE NEGATIVE Final   Influenza B by PCR NEGATIVE NEGATIVE Final    Comment: (NOTE) The Xpert Xpress SARS-CoV-2/FLU/RSV plus assay is intended as an aid in the diagnosis of influenza from Nasopharyngeal swab specimens and should not be used as a sole basis for treatment. Nasal washings and aspirates are unacceptable for Xpert  Xpress SARS-CoV-2/FLU/RSV testing.  Fact Sheet for Patients: EntrepreneurPulse.com.au  Fact Sheet for Healthcare Providers: IncredibleEmployment.be  This test is not yet approved or cleared by the [place] FDA and has been authorized for detection and/or diagnosis of SARS-CoV-2 by FDA under an Emergency Use Authorization (EUA). This EUA will remain in effect (meaning this test can be used) for the duration of the COVID-19 declaration under Section 564(b)(1) of the Act, 21 U.S.C. section 360bbb-3(b)(1), unless the authorization is terminated or revoked.  Performed at [facility], [place] [address], [place], [place] [address]       Studies: No results found.   Flora Lipps, MD  [facility] 11/20/2021  If 7PM-7AM, please contact night-coverage

## 2021-11-20 NOTE — Progress Notes (Signed)
Dressing to coccyx changed as ordered. Pt tolerated well. Will continue to monitor.

## 2021-11-21 DIAGNOSIS — I5032 Chronic diastolic (congestive) heart failure: Secondary | ICD-10-CM | POA: Diagnosis not present

## 2021-11-21 DIAGNOSIS — K219 Gastro-esophageal reflux disease without esophagitis: Secondary | ICD-10-CM | POA: Diagnosis not present

## 2021-11-21 DIAGNOSIS — S72001A Fracture of unspecified part of neck of right femur, initial encounter for closed fracture: Secondary | ICD-10-CM | POA: Diagnosis not present

## 2021-11-21 MED ORDER — SENNOSIDES-DOCUSATE SODIUM 8.6-50 MG PO TABS
2.0000 | ORAL_TABLET | Freq: Every day | ORAL | Status: DC
  Administered 2021-11-22 – 2021-11-26 (×5): 2 via ORAL
  Filled 2021-11-21 (×5): qty 2

## 2021-11-21 NOTE — Plan of Care (Signed)
  Problem: Coping: Goal: Level of anxiety will decrease Outcome: Progressing   Problem: Pain Managment: Goal: General experience of comfort will improve Outcome: Progressing   Problem: Safety: Goal: Ability to remain free from injury will improve Outcome: Progressing   

## 2021-11-21 NOTE — Progress Notes (Signed)
°  Progress Note Patient: Heather [name] HLK:[id number] DOB: [birth year] DOA: 11/11/2021     10 DOS: the patient was seen and examined on 11/21/2021   Brief hospital course: 88 year old woman sustained hip fracture status post mechanical fall.  Status post surgery 12/28 without event.  Incidental COVID-positive.  Stable for transfer, await 10-day quarantine.  Assessment and Plan Closed right intertrochanteric hip fracture. -- Status post surgery 12/28.  Plan for SNF, await quarantine completion.  Chronic diastolic CHF  --Continue carvedilol, currently compensated.  Lasix stopped secondary to hypotension, monitor volume status daily.     GERD without esophagitis --Continue Pepcid   Constipation -- Takes senna 2 tablets in the morning, will adjust schedule   Incidental COVID-positive.  Tested positive on 11/12/2021 --Asymptomatic.  Await completion of 10-day quarantine.   Abnormal urinalysis with confusion disorientation.  Possibility of UTI.   Received Keflex secondary to IV refusal.  Completed 3-day course.  Will discontinue.  Subjective:  Has some pain in hand but otherwise feels fine, eating very well.  Requesting senna in the morning when she usually takes it.  Requesting medication for GERD.  Objective Vital signs were reviewed and unremarkable. Physical Exam Constitutional:      General: She is not in acute distress.    Appearance: She is not ill-appearing or toxic-appearing.  Cardiovascular:     Rate and Rhythm: Normal rate and regular rhythm.     Heart sounds: No murmur heard. Pulmonary:     Effort: Pulmonary effort is normal. No respiratory distress.     Breath sounds: No wheezing, rhonchi or rales.  Neurological:     Mental Status: She is alert.  Psychiatric:        Mood and Affect: Mood normal.        Behavior: Behavior normal.   Data Reviewed:  No new data  Family Communication: none  Disposition: Status is: Inpatient  Remains inpatient appropriate  because: needs SNF         Time spent: 20 minutes  Author: Murray Hodgkins, MD 11/21/2021 7:53 PM  For on call review [email or web address].

## 2021-11-21 NOTE — Progress Notes (Signed)
Patient refused to wear Prevalon Boots. Encouraged and educated multiple times. Repositioned with pillows for legs support.

## 2021-11-21 NOTE — Progress Notes (Signed)
Patient refusing to wear prevalon boots, has also been refusing to be repositioned this evening, will attempt to reposition or place pillow support as patient allows.

## 2021-11-21 NOTE — Plan of Care (Signed)
°  Problem: Coping: Goal: Level of anxiety will decrease Outcome: Progressing   Problem: Clinical Measurements: Goal: Ability to maintain clinical measurements within normal limits will improve Outcome: Progressing   Problem: Safety: Goal: Ability to remain free from injury will improve Outcome: Progressing   Problem: Skin Integrity: Goal: Risk for impaired skin integrity will decrease Outcome: Progressing

## 2021-11-22 DIAGNOSIS — S72001A Fracture of unspecified part of neck of right femur, initial encounter for closed fracture: Secondary | ICD-10-CM | POA: Diagnosis not present

## 2021-11-22 DIAGNOSIS — I5032 Chronic diastolic (congestive) heart failure: Secondary | ICD-10-CM | POA: Diagnosis not present

## 2021-11-22 DIAGNOSIS — U071 COVID-19: Secondary | ICD-10-CM | POA: Diagnosis not present

## 2021-11-22 NOTE — Hospital Course (Addendum)
88 year old woman sustained hip fracture status post mechanical fall.  Status post surgery 12/28 with expected perioperative blood loss.  Hospitalization complicated by mild acute metabolic encephalopathy and delirium.  Seen by PT with recommendation for SNF.  Incidental COVID-positive which prolonged hospitalization as facilities require 10-day quarantine prior to transfer.  Completed 10-day quarantine. 1/9 stable for transfer to SNF

## 2021-11-22 NOTE — Progress Notes (Signed)
Physical Therapy Treatment Patient Details Name: Heather [name] MRN: [id number] DOB: 06-18-1918 Today's Date: 11/22/2021   History of Present Illness 88 y.o. female with past medical history of anxiety, hypertension, diastolic congestive heart failure, GERD, was brought into the hospital by EMS after sustaining a fall at home after she lost her balance patient had a mechanical fall and at  baseline is confused and delirious. Dx of R femoral neck fx, s/p IM nail 11/13/21, Covid positive.    PT Comments    Pt very cooperative this date but requires frequent cues to focus on task.  Pt up to ambulate in room 10' twice with seated rest break between.  Then up in chair for lunch.  Pt is progressing with mobility but continues to require increased time and significant assist for all tasks and would benefit from follow up rehab at SNF level to maximize IND and safety.   Recommendations for follow up therapy are one component of a multi-disciplinary discharge planning process, led by the attending physician.  Recommendations may be updated based on patient status, additional functional criteria and insurance authorization.  Follow Up Recommendations  Skilled nursing-short term rehab (<3 hours/day)     Assistance Recommended at Discharge Frequent or constant Supervision/Assistance  Patient can return home with the following A lot of help with walking and/or transfers;A lot of help with bathing/dressing/bathroom   Equipment Recommendations  None recommended by PT    Recommendations for Other Services       Precautions / Restrictions Precautions Precautions: Fall Restrictions Weight Bearing Restrictions: No RLE Weight Bearing: Weight bearing as tolerated     Mobility  Bed Mobility               General bed mobility comments: Pt up in chair and requests back to same    Transfers Overall transfer level: Needs assistance Equipment used: Rolling walker (2 wheels) Transfers: Sit  to/from Stand Sit to Stand: +2 physical assistance;+2 safety/equipment;Min assist;Mod assist           General transfer comment: Assist to power up, stabilize, control descent. Multimodal cueing required.    Ambulation/Gait Ambulation/Gait assistance: Min assist;Mod assist;+2 safety/equipment Gait Distance (Feet): 10 Feet (10' twice) Assistive device: Rolling walker (2 wheels) Gait Pattern/deviations: Step-to pattern;Decreased step length - right;Decreased step length - left;Shuffle;Trunk flexed Gait velocity: decreased     General Gait Details: cues for sequence, posture and position from RW,  Pt with flexed posture including back, knees and hip.   Stairs             Wheelchair Mobility    Modified Rankin (Stroke Patients Only)       Balance Overall balance assessment: Needs assistance;History of Falls Sitting-balance support: Feet supported;Bilateral upper extremity supported Sitting balance-Leahy Scale: Fair     Standing balance support: Bilateral upper extremity supported;Reliant on assistive device for balance Standing balance-Leahy Scale: Poor                              Cognition Arousal/Alertness: Awake/alert Behavior During Therapy: WFL for tasks assessed/performed Overall Cognitive Status: History of cognitive impairments - at baseline                                 General Comments: Self distracting        Exercises General Exercises - Lower Extremity Ankle Circles/Pumps: AAROM;Both;10 reps;Supine  General Comments        Pertinent Vitals/Pain Pain Assessment: Faces Faces Pain Scale: Hurts little more Pain Location: R hip with movement Pain Descriptors / Indicators: Grimacing;Guarding Pain Intervention(s): Limited activity within patient's tolerance;Monitored during session    Home Living                          Prior Function            PT Goals (current goals can now be found in the  care plan section) Acute Rehab PT Goals Patient Stated Goal: Walk again PT Goal Formulation: With patient Time For Goal Achievement: 11/28/21 Potential to Achieve Goals: Fair Progress towards PT goals: Progressing toward goals    Frequency    Min 3X/week      PT Plan Current plan remains appropriate    Co-evaluation              AM-PAC PT "6 Clicks" Mobility   Outcome Measure  Help needed turning from your back to your side while in a flat bed without using bedrails?: A Lot Help needed moving from lying on your back to sitting on the side of a flat bed without using bedrails?: A Lot Help needed moving to and from a bed to a chair (including a wheelchair)?: A Lot Help needed standing up from a chair using your arms (e.g., wheelchair or bedside chair)?: A Lot Help needed to walk in hospital room?: Total Help needed climbing 3-5 steps with a railing? : Total 6 Click Score: 10    End of Session Equipment Utilized During Treatment: Gait belt Activity Tolerance: Patient limited by fatigue Patient left: in chair;with call bell/phone within reach;with family/visitor present Nurse Communication: Mobility status PT Visit Diagnosis: Difficulty in walking, not elsewhere classified (R26.2);Pain;History of falling (Z91.81) Pain - Right/Left: Right Pain - part of body: Hip     Time: 5638-7564 PT Time Calculation (min) (ACUTE ONLY): 30 min  Charges:  $Gait Training: 23-37 mins                     [place] Pager [phone] Office [phone]    Henri Guedes 11/22/2021, 12:20 PM

## 2021-11-22 NOTE — Plan of Care (Signed)
°  Problem: Clinical Measurements: Goal: Ability to maintain clinical measurements within normal limits will improve Outcome: Progressing   Problem: Pain Managment: Goal: General experience of comfort will improve Outcome: Progressing   Problem: Clinical Measurements: Goal: Will remain free from infection Outcome: Progressing   Problem: Safety: Goal: Ability to remain free from injury will improve Outcome: Progressing   Problem: Skin Integrity: Goal: Risk for impaired skin integrity will decrease Outcome: Progressing

## 2021-11-22 NOTE — TOC Progression Note (Signed)
Transition of Care [facility]) - Progression Note    Patient Details  Name: Heather [name] MRN: [id number] Date of Birth: 1918/08/18  Transition of [facility] CM/SW Contact  Lennart Pall, LCSW Phone Number: 11/22/2021, 11:25 AM  Clinical Narrative:    Have spoken with pt and son, [name], and they understand the SNF bed search re-started as pt now day 11 post positive COVID test.  Once bed offered/ chosen, will need to restart ins auth (awaiting updated PT documentation). TOC continue to follow.   Expected Discharge Plan: Skilled Nursing Facility Barriers to Discharge: SNF Pending bed offer, Insurance Authorization  Expected Discharge Plan and Services Expected Discharge Plan: [place] In-house Referral: Clinical Social Work   Post Acute Care Choice: [place] Living arrangements for the past 2 months: Single Family Home                                       Social Determinants of Health (SDOH) Interventions    Readmission Risk Interventions No flowsheet data found.

## 2021-11-22 NOTE — NC FL2 (Signed)
[place] LEVEL OF CARE SCREENING TOOL     IDENTIFICATION  Patient Name: Heather [name] Birthdate: Apr 21, 1918 Sex: female Admission Date (Current Location): 11/11/2021  [facility] [place] Number:  Herbalist and Address:  [facility],  [place], [place]      Provider Number: [id number]  Attending Physician Name and Address:  Samuella Cota, MD  Relative Name and Phone Number:  Chrystle Murillo (son) Ph: [phone]    Current Level of Care: Hospital Recommended Level of Care: [place] Prior Approval Number:    Date Approved/Denied:   PASRR Number: [id number]  Discharge Plan: SNF    Current Diagnoses: Patient Active Problem List   Diagnosis Date Noted   Pressure injury of skin 11/19/2021   Closed displaced fracture of right femoral neck ([place]) 11/11/2021   Restlessness 11/11/2021   Seasonal allergies 06/14/2021   Primary osteoarthritis involving multiple joints 06/14/2021   Pre-ulcerative corn or callous 10/04/2020   Osteoporosis 05/22/2020   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) 05/17/2020   Kyphosis of thoracic region 05/17/2020   SI (sacroiliac) joint inflammation ([place]) 10/04/2019   DDD (degenerative disc disease), lumbar 10/04/2019   Hearing decreased, bilateral 06/30/2019   Chronic pain syndrome 03/03/2019   Unilateral primary osteoarthritis, right knee 01/13/2019   Medication management 02/28/2018   Urinary incontinence without sensory awareness 12/02/2017   Arthritis of left hip 10/20/2017   Functional abdominal pain syndrome 09/02/2017   Encounter for chronic pain management 07/22/2017   Dementia with behavioral disturbance 06/25/2017   Vitamin D deficiency 05/21/2017   Chronic LLQ pain 11/23/2014   Esophageal dysmotility 10/27/2014   Arthritis of hand, degenerative 04/10/2014   Other and unspecified hyperlipidemia 04/10/2014   Chronic  bronchitis ([place]) 05/30/2013   Hx of breast cancer 08/30/2012   Venous insufficiency of both lower extremities 06/12/2011   GERD without esophagitis 60/08/9322   Diastolic CHF ([place]) 55/73/2202   Constipation 01/18/2009   Cervical spondylosis without myelopathy 03/21/2008   Diaphragmatic hernia 12/26/2007   Anxiety state 09/15/2007   Primary hypertension 09/15/2007   Venous (peripheral) insufficiency 09/15/2007    Orientation RESPIRATION BLADDER Height & Weight     Self  Normal Incontinent Weight: 110 lb (49.9 kg) Height:  4\' 10"  (147.3 cm)  BEHAVIORAL SYMPTOMS/MOOD NEUROLOGICAL BOWEL NUTRITION STATUS   (N/A)  (N/A)  (purewick currently) Diet (Regular diet)  AMBULATORY STATUS COMMUNICATION OF NEEDS Skin   Extensive Assist Verbally Surgical wounds, Other (Comment) (Cracking: bilateral feet; Ecchymosis: bilateral arms)                       Personal Care Assistance Level of Assistance  Bathing, Feeding, Dressing Bathing Assistance: Maximum assistance Feeding assistance: Limited assistance Dressing Assistance: Maximum assistance     Functional Limitations Info  Sight, Hearing, Speech Sight Info: Impaired Hearing Info: Impaired Speech Info: Adequate    SPECIAL CARE FACTORS FREQUENCY  PT (By licensed PT), OT (By licensed OT)     PT Frequency: 5x's/week OT Frequency: 5x's/week            Contractures Contractures Info: Not present    Additional Factors Info  Code Status, Allergies, Psychotropic Code Status Info: Full Allergies Info: Fentanyl, Naprosyn (Naproxen) Psychotropic Info: Risperdal, Xanax, Haldol         Current Medications (11/22/2021):  This is the current hospital active medication list Current Facility-Administered Medications  Medication Dose Route Frequency Provider  Last Rate Last Admin   acetaminophen (TYLENOL) tablet 650 mg  650 mg Oral Q6H PRN Vanetta Mulders, MD   650 mg at 11/21/21 2109   carvedilol (COREG) tablet 3.125 mg  3.125 mg Oral  BID Pokhrel, Laxman, MD   3.125 mg at 11/21/21 2109   enoxaparin (LOVENOX) injection 30 mg  30 mg Subcutaneous Q24H Pokhrel, Laxman, MD   30 mg at 11/22/21 0929   famotidine (PEPCID) tablet 20 mg  20 mg Oral Q0600 Pokhrel, Laxman, MD   20 mg at 11/22/21 0644   feeding supplement (ENSURE ENLIVE / ENSURE PLUS) liquid 237 mL  237 mL Oral BID BM Vanetta Mulders, MD   237 mL at 11/22/21 0931   feeding supplement (ENSURE PRE-SURGERY) liquid 296 mL  296 mL Oral Once Vanetta Mulders, MD       haloperidol lactate (HALDOL) injection 1 mg  1 mg Intravenous Q6H PRN Vanetta Mulders, MD   1 mg at 11/12/21 9021   multivitamin with minerals tablet 1 tablet  1 tablet Oral Daily Vanetta Mulders, MD   1 tablet at 11/22/21 1155   nutrition supplement (JUVEN) (JUVEN) powder packet 1 packet  1 packet Oral BID BM Pokhrel, Laxman, MD   1 packet at 11/21/21 1743   ondansetron (ZOFRAN) tablet 4 mg  4 mg Oral Q6H PRN Vanetta Mulders, MD       Or   ondansetron (ZOFRAN) injection 4 mg  4 mg Intravenous Q6H PRN Vanetta Mulders, MD       oxyCODONE-acetaminophen (PERCOCET/ROXICET) 5-325 MG per tablet 1 tablet  1 tablet Oral Q6H PRN Pokhrel, Laxman, MD       polyethylene glycol (MIRALAX / GLYCOLAX) packet 17 g  17 g Oral Daily Vanetta Mulders, MD   17 g at 11/22/21 0931   polyvinyl alcohol (LIQUIFILM TEARS) 1.4 % ophthalmic solution 1 drop  1 drop Both Eyes Daily PRN Vanetta Mulders, MD       risperiDONE (RISPERDAL M-TABS) disintegrating tablet 0.5 mg  0.5 mg Oral QHS Vanetta Mulders, MD   0.5 mg at 11/21/21 2109   senna-docusate (Senokot-S) tablet 2 tablet  2 tablet Oral Daily Samuella Cota, MD   2 tablet at 11/22/21 2080     Discharge Medications: Please see discharge summary for a list of discharge medications.  Relevant Imaging Results:  Relevant Lab Results:   Additional Information SSN: [id number]  Lennart Pall, LCSW

## 2021-11-22 NOTE — Progress Notes (Signed)
°  Progress Note   Patient: Heather [name] [id number]:[id number] DOB: August 23, 1918 DOA: 11/11/2021     11 DOS: the patient was seen and examined on 11/22/2021   Brief hospital course: 88 year old woman sustained hip fracture status post mechanical fall.  Status post surgery 12/28 without event.  Incidental COVID-positive.  Stable for transfer, completed 10-day quarantine.  Assessment and Plan Closed right intertrochanteric hip fracture. -- Status post surgery 12/28.  Plan for SNF, completed, TOC working on securing a bed.  Quarantine completion.   Chronic diastolic CHF  --Continue carvedilol, appears compensated.  Lasix stopped secondary to hypotension, monitor volume status daily.     GERD without esophagitis --Continue Pepcid   Constipation -- Takes senna 2 tablets in the morning   Incidental COVID-positive.  Tested positive on 11/12/2021 --Asymptomatic.  Completed 10-day quarantine.   Abnormal urinalysis with confusion disorientation.  Possibility of UTI.   Received Keflex secondary to IV refusal.  Completed 3-day course.     Subjective:  Feels ok  Objective Vital signs were reviewed and unremarkable. Physical Exam Vitals reviewed.  Constitutional:      General: She is not in acute distress.    Appearance: She is not ill-appearing or toxic-appearing.  Cardiovascular:     Rate and Rhythm: Normal rate and regular rhythm.     Heart sounds: No murmur heard. Pulmonary:     Effort: Pulmonary effort is normal. No respiratory distress.     Breath sounds: No wheezing or rales.  Neurological:     Mental Status: She is alert.  Psychiatric:        Mood and Affect: Mood normal.        Behavior: Behavior normal.     Data Reviewed:  There are no new results to review at this time.  Family Communication: none  Disposition: Status is: Inpatient  Remains inpatient appropriate because: needs SNF         Time spent: 20 minutes  Author: Murray Hodgkins, MD 11/22/2021 9:42  PM  For on call review [email or web address].

## 2021-11-23 DIAGNOSIS — U071 COVID-19: Secondary | ICD-10-CM

## 2021-11-23 DIAGNOSIS — D62 Acute posthemorrhagic anemia: Secondary | ICD-10-CM

## 2021-11-23 DIAGNOSIS — G9341 Metabolic encephalopathy: Secondary | ICD-10-CM

## 2021-11-23 DIAGNOSIS — I5032 Chronic diastolic (congestive) heart failure: Secondary | ICD-10-CM | POA: Diagnosis not present

## 2021-11-23 DIAGNOSIS — S72001A Fracture of unspecified part of neck of right femur, initial encounter for closed fracture: Secondary | ICD-10-CM | POA: Diagnosis not present

## 2021-11-23 NOTE — Assessment & Plan Note (Signed)
--   Perioperative in nature, expected, stable.  No further evaluation suggested.  Asymptomatic.

## 2021-11-23 NOTE — Assessment & Plan Note (Addendum)
--   May no longer be an operative diagnosis.  Stopped carvedilol for low normal BP.

## 2021-11-23 NOTE — Plan of Care (Signed)
°  Problem: Clinical Measurements: Goal: Ability to maintain clinical measurements within normal limits will improve Outcome: Progressing   Problem: Activity: Goal: Risk for activity intolerance will decrease Outcome: Progressing   Problem: Coping: Goal: Level of anxiety will decrease Outcome: Progressing   Problem: Pain Managment: Goal: General experience of comfort will improve Outcome: Progressing   Problem: Safety: Goal: Ability to remain free from injury will improve Outcome: Progressing   Problem: Skin Integrity: Goal: Risk for impaired skin integrity will decrease Outcome: Progressing   

## 2021-11-23 NOTE — Plan of Care (Signed)
°  Problem: Coping: Goal: Level of anxiety will decrease Outcome: Progressing   Problem: Pain Managment: Goal: General experience of comfort will improve Outcome: Progressing   Problem: Safety: Goal: Ability to remain free from injury will improve Outcome: Progressing   Problem: Clinical Measurements: Goal: Postoperative complications will be avoided or minimized Outcome: Progressing

## 2021-11-23 NOTE — Assessment & Plan Note (Signed)
--   DTPI to left heel, DTPI in evolution to coccyx Wound type:Pressure Pressure Injury POA: Yes Measurement:Per Nursing Flow Sheet  Left heel: 4cm x 2cm  Coccyx: 4cm x 3cm x 0.1cm Wound bed: Left heel: Purple discoloration, intact Coccyx: Purple, sloughing skin loss area is red tissue Drainage (amount, consistency, odor) serous Periwound: intact Dressing procedure/placement/frequency: Patient is able to turn and reposition in bed and has been up with PT. Bilateral pressure redistribution heel boots and a pressure redistribution chair cushion for the times when she is OOB to chair.

## 2021-11-23 NOTE — Assessment & Plan Note (Addendum)
--   Intermittent delirium and metabolic encephalopathy during hospitalization, generally resolved at this time.  She is quite talkative today, memory appears good, no signs or symptoms of encephalopathy today. -- Was seen by psychiatry earlier in the hospitalization for delirium and capacity assessment

## 2021-11-23 NOTE — Plan of Care (Signed)
  Problem: Coping: Goal: Level of anxiety will decrease Outcome: Progressing   Problem: Pain Managment: Goal: General experience of comfort will improve Outcome: Progressing   Problem: Safety: Goal: Ability to remain free from injury will improve Outcome: Progressing   

## 2021-11-23 NOTE — Assessment & Plan Note (Signed)
--   Appears euvolemic.  Lasix stopped secondary to hypotension.  Monitor volume status.  Continue carvedilol as blood pressure tolerates.

## 2021-11-23 NOTE — Assessment & Plan Note (Signed)
Continue Pepcid  

## 2021-11-23 NOTE — Assessment & Plan Note (Signed)
--   Status post right cephulomedullary nail 12/28.   -- WBAT, Lovenox for DVT prophylaxis  --Completed quarantine for incidental COVID --f/u with Dr. Sammuel Hines around 1/11 for staple removal

## 2021-11-23 NOTE — Assessment & Plan Note (Signed)
--   Incidental, no symptoms, did not require any treatment.  Completed 10-day quarantine 1/6.  Can discontinue precautions.

## 2021-11-23 NOTE — Progress Notes (Signed)
°  Progress Note   Patient: Heather [name] [id number]:[id number] DOB: Feb 25, 1918 DOA: 11/11/2021     12 DOS: the patient was seen and examined on 11/23/2021   Brief hospital course: 88 year old woman sustained hip fracture status post mechanical fall.  Status post surgery 12/28 with expected perioperative blood loss.  Hospitalization complicated by mild acute metabolic encephalopathy and delirium.  Seen by PT with recommendation for SNF.  Incidental COVID-positive which prolonged hospitalization as facilities require 10-day quarantine prior to transfer.  Stable for transfer, completed 10-day quarantine.   Assessment and Plan * Closed displaced fracture of right femoral neck ([place])- (present on admission) -- Status post right cephulomedullary nail 12/28.   -- WBAT, Lovenox for DVT prophylaxis  --Completed quarantine for incidental COVID --f/u with Dr. Sammuel Hines around 1/11 for staple removal  ABLA -- Perioperative in nature, expected, stable.  No further evaluation suggested.  Asymptomatic.  Chronic diastolic CHF (congestive heart failure) ([place])- (present on admission) -- Appears euvolemic.  Lasix stopped secondary to hypotension.  Monitor volume status.  Continue carvedilol as blood pressure tolerates.  Primary hypertension- (present on admission) -- May no longer be an operative diagnosis.  Would consider stopping carvedilol as an outpatient.  Acute metabolic encephalopathy -- Intermittent delirium and metabolic encephalopathy during hospitalization, generally resolved at this time -- Was seen by psychiatry earlier in the hospitalization for delirium and capacity assessment  Pressure injury of skin- (present on admission) -- DTPI to left heel, DTPI in evolution to coccyx Wound type:Pressure Pressure Injury POA: Yes Measurement:Per Nursing Flow Sheet  Left heel: 4cm x 2cm  Coccyx: 4cm x 3cm x 0.1cm Wound bed: Left heel: Purple discoloration, intact Coccyx: Purple, sloughing skin loss area  is red tissue Drainage (amount, consistency, odor) serous Periwound: intact Dressing procedure/placement/frequency: Patient is able to turn and reposition in bed and has been up with PT. Bilateral pressure redistribution heel boots and a pressure redistribution chair cushion for the times when she is OOB to chair.   GERD without esophagitis- (present on admission) -- Continue Pepcid  Lab test positive for detection of COVID-19 virus -- Incidental, no symptoms, did not require any treatment.  Completed 10-day quarantine 1/6.  Can discontinue precautions.     Subjective:  Feels fine  Objective Vital signs were reviewed and unremarkable. Physical Exam Vitals reviewed.  Constitutional:      General: She is not in acute distress.    Appearance: She is not ill-appearing or toxic-appearing.  Cardiovascular:     Rate and Rhythm: Normal rate and regular rhythm.     Heart sounds: No murmur heard. Pulmonary:     Effort: No respiratory distress.     Breath sounds: No wheezing or rales.  Abdominal:     Palpations: Abdomen is soft.  Neurological:     Mental Status: She is alert.  Psychiatric:        Mood and Affect: Mood normal.     Comments: A little confused     Data Reviewed:  No new data  Family Communication:   Disposition: Status is: Inpatient  Remains inpatient appropriate because: waiting for SNF bed         Time spent: 35 minutes  Author: Murray Hodgkins, MD 11/23/2021 4:58 PM  For on call review [email or web address].

## 2021-11-24 DIAGNOSIS — G9341 Metabolic encephalopathy: Secondary | ICD-10-CM | POA: Diagnosis not present

## 2021-11-24 DIAGNOSIS — S72001A Fracture of unspecified part of neck of right femur, initial encounter for closed fracture: Secondary | ICD-10-CM | POA: Diagnosis not present

## 2021-11-24 DIAGNOSIS — I10 Essential (primary) hypertension: Secondary | ICD-10-CM | POA: Diagnosis not present

## 2021-11-24 NOTE — Plan of Care (Signed)
°  Problem: Clinical Measurements: Goal: Ability to maintain clinical measurements within normal limits will improve Outcome: Progressing   Problem: Activity: Goal: Risk for activity intolerance will decrease Outcome: Progressing   Problem: Coping: Goal: Level of anxiety will decrease Outcome: Progressing   Problem: Pain Managment: Goal: General experience of comfort will improve Outcome: Progressing   Problem: Safety: Goal: Ability to remain free from injury will improve Outcome: Progressing   Problem: Skin Integrity: Goal: Risk for impaired skin integrity will decrease Outcome: Progressing   

## 2021-11-24 NOTE — Progress Notes (Signed)
Patient refusing to take night medication tonight. Says that she wants to call a cab to go to the nursing home. Also states "something is strange here, ya'll are trying to kill me", attempted to reorient patient but she said "I don't want nothing from you" and she closed her eyes and would not look at me. Bed alarm on, vss, appears to be in no acute distress, will continue to monitor.

## 2021-11-24 NOTE — Progress Notes (Addendum)
Progress Note   Patient: Heather [name] NID:[id number] DOB: 06/07/1918 DOA: 11/11/2021     13 DOS: the patient was seen and examined on 11/24/2021   Brief hospital course: 88 year old woman sustained hip fracture status post mechanical fall.  Status post surgery 12/28 with expected perioperative blood loss.  Hospitalization complicated by mild acute metabolic encephalopathy and delirium.  Seen by PT with recommendation for SNF.  Incidental COVID-positive which prolonged hospitalization as facilities require 10-day quarantine prior to transfer.  Stable for transfer, completed 10-day quarantine.  Assessment and Plan * Closed displaced fracture of right femoral neck ([place])- (present on admission) -- Status post right cephulomedullary nail 12/28.   -- WBAT, Lovenox for DVT prophylaxis  --Completed quarantine for incidental COVID --f/u with Dr. Sammuel Hines around 1/11 for staple removal  ABLA -- Perioperative in nature, expected, stable.  No further evaluation suggested.  Asymptomatic.  Chronic diastolic CHF (congestive heart failure) ([place])- (present on admission) -- Appears euvolemic.  Lasix stopped secondary to hypotension.  Monitor volume status.  Continue carvedilol as blood pressure tolerates.  Primary hypertension- (present on admission) -- May no longer be an operative diagnosis.  Would consider stopping carvedilol as an outpatient.  Acute metabolic encephalopathy -- Intermittent delirium and metabolic encephalopathy during hospitalization, generally resolved at this time -- Was seen by psychiatry earlier in the hospitalization for delirium and capacity assessment  Pressure injury of skin- (present on admission) -- DTPI to left heel, DTPI in evolution to coccyx Wound type:Pressure Pressure Injury POA: Yes Measurement:Per Nursing Flow Sheet  Left heel: 4cm x 2cm  Coccyx: 4cm x 3cm x 0.1cm Wound bed: Left heel: Purple discoloration, intact Coccyx: Purple, sloughing skin loss area  is red tissue Drainage (amount, consistency, odor) serous Periwound: intact Dressing procedure/placement/frequency: Patient is able to turn and reposition in bed and has been up with PT. Bilateral pressure redistribution heel boots and a pressure redistribution chair cushion for the times when she is OOB to chair.   GERD without esophagitis- (present on admission) -- Continue Pepcid  Lab test positive for detection of COVID-19 virus -- Incidental, no symptoms, did not require any treatment.  Completed 10-day quarantine 1/6.  Can discontinue precautions.     Subjective:  Feels good, eating well  Objective Vital signs were reviewed and unremarkable. Physical Exam Vitals reviewed.  Constitutional:      General: She is not in acute distress.    Appearance: She is not ill-appearing or toxic-appearing.  Cardiovascular:     Rate and Rhythm: Normal rate and regular rhythm.     Heart sounds: No murmur heard. Pulmonary:     Effort: Pulmonary effort is normal. No respiratory distress.     Breath sounds: No wheezing, rhonchi or rales.  Musculoskeletal:     Right lower leg: No edema.     Left lower leg: No edema.     Comments: Moves both extremities to command  Neurological:     Mental Status: She is alert.  Psychiatric:        Mood and Affect: Mood normal.        Behavior: Behavior normal.     Data Reviewed:  There are no new results to review at this time.  Family Communication: updated son [name] by telephone  Disposition: Status is: Inpatient  Remains inpatient appropriate because: await SNF bed, pt is medically stable         Time spent: 20 minutes  Author: Murray Hodgkins, MD 11/24/2021 11:08 AM  For on call review  [email or web address]

## 2021-11-24 NOTE — Plan of Care (Signed)
  Problem: Safety: Goal: Ability to remain free from injury will improve Outcome: Progressing   Problem: Coping: Goal: Level of anxiety will decrease Outcome: Progressing   Problem: Pain Managment: Goal: General experience of comfort will improve Outcome: Progressing   

## 2021-11-25 DIAGNOSIS — I5032 Chronic diastolic (congestive) heart failure: Secondary | ICD-10-CM | POA: Diagnosis not present

## 2021-11-25 DIAGNOSIS — S72001A Fracture of unspecified part of neck of right femur, initial encounter for closed fracture: Secondary | ICD-10-CM | POA: Diagnosis not present

## 2021-11-25 DIAGNOSIS — L89156 Pressure-induced deep tissue damage of sacral region: Secondary | ICD-10-CM | POA: Diagnosis not present

## 2021-11-25 NOTE — TOC Progression Note (Addendum)
Transition of Care [facility]) - Progression Note    Patient Details  Name: Heather [name] MRN: [id number] Date of Birth: Mar 23, 1918  Transition of Care [facility]) CM/SW Contact  Lennart Pall, LCSW Phone Number: 11/25/2021, 4:45 PM  Clinical Narrative:    Have confirmed with pt/ son that they would like to accept SNF bed offer with [facility].  Facility can plan admit for tomorrow.  Have begun insurance authorization.  MD/RN aware.  Addendum:  have insurance auth ref # [id number]  Expected Discharge Plan: Skilled Nursing Facility Barriers to Discharge: SNF Pending bed offer, Insurance Authorization  Expected Discharge Plan and Services Expected Discharge Plan: [place] In-house Referral: Clinical Social Work   Post Acute Care Choice: [place] Living arrangements for the past 2 months: Single Family Home                                       Social Determinants of Health (SDOH) Interventions    Readmission Risk Interventions No flowsheet data found.

## 2021-11-25 NOTE — Progress Notes (Signed)
°  Progress Note   Patient: Heather [name] FGH:[id number] DOB: 01/14/1918 DOA: 11/11/2021     14 DOS: the patient was seen and examined on 11/25/2021   Brief hospital course: 88 year old woman sustained hip fracture status post mechanical fall.  Status post surgery 12/28 with expected perioperative blood loss.  Hospitalization complicated by mild acute metabolic encephalopathy and delirium.  Seen by PT with recommendation for SNF.  Incidental COVID-positive which prolonged hospitalization as facilities require 10-day quarantine prior to transfer.  Completed 10-day quarantine. 1/9 stable for transfer to SNF   Assessment and Plan * Closed displaced fracture of right femoral neck ([place])- (present on admission) -- Status post right cephulomedullary nail 12/28.   -- WBAT, Lovenox for DVT prophylaxis  --Completed quarantine for incidental COVID --f/u with Dr. Sammuel Hines around 1/11 for staple removal  ABLA -- Perioperative in nature, expected, stable.  No further evaluation suggested.  Asymptomatic.  Chronic diastolic CHF (congestive heart failure) ([place])- (present on admission) -- Appears euvolemic.  Lasix stopped secondary to hypotension.  Monitor volume status.  Continue carvedilol as blood pressure tolerates.  Primary hypertension- (present on admission) -- May no longer be an operative diagnosis.  Stopped carvedilol for low normal BP.  Acute metabolic encephalopathy -- Intermittent delirium and metabolic encephalopathy during hospitalization, generally resolved at this time.  She is quite talkative today, memory appears good, no signs or symptoms of encephalopathy today. -- Was seen by psychiatry earlier in the hospitalization for delirium and capacity assessment  Pressure injury of skin- (present on admission) -- DTPI to left heel, DTPI in evolution to coccyx Wound type:Pressure Pressure Injury POA: Yes Measurement:Per Nursing Flow Sheet  Left heel: 4cm x 2cm  Coccyx: 4cm x 3cm x  0.1cm Wound bed: Left heel: Purple discoloration, intact Coccyx: Purple, sloughing skin loss area is red tissue Drainage (amount, consistency, odor) serous Periwound: intact Dressing procedure/placement/frequency: Patient is able to turn and reposition in bed and has been up with PT. Bilateral pressure redistribution heel boots and a pressure redistribution chair cushion for the times when she is OOB to chair.   GERD without esophagitis- (present on admission) -- Continue Pepcid  Lab test positive for detection of COVID-19 virus -- Incidental, no symptoms, did not require any treatment.  Completed 10-day quarantine 1/6.  Can discontinue precautions.     Subjective:  Feels fine  Objective Vital signs were reviewed and unremarkable. Physical Exam Exam conducted with a chaperone present.  Constitutional:      General: She is not in acute distress.    Appearance: She is not ill-appearing or toxic-appearing.  Cardiovascular:     Rate and Rhythm: Normal rate and regular rhythm.     Heart sounds: No murmur heard. Pulmonary:     Effort: Pulmonary effort is normal. No respiratory distress.     Breath sounds: No wheezing, rhonchi or rales.  Skin:    Comments: See picture of sacral wound  Neurological:     Mental Status: She is alert.  Psychiatric:        Mood and Affect: Mood normal.        Behavior: Behavior normal.    Data Reviewed:  There are no new results to review at this time.  Family Communication: none  Disposition: Status is: Inpatient  Remains inpatient appropriate because: awaiting SNF bed         Time spent: 25 minutes  Author: Murray Hodgkins, MD 11/25/2021 10:01 AM  For on call review [email or web address].

## 2021-11-25 NOTE — Progress Notes (Signed)
Physical Therapy Treatment Patient Details Name: Heather [name] MRN: [id number] DOB: 08-23-1918 Today's Date: 11/25/2021   History of Present Illness 88 y.o. female with past medical history of anxiety, hypertension, diastolic congestive heart failure, GERD, was brought into the hospital by EMS after sustaining a fall at home after she lost her balance patient had a mechanical fall and at  baseline is confused and delirious. Dx of R femoral neck fx, s/p IM nail 11/13/21, Covid positive.    PT Comments    Pt assisted to [facility] and then took a few steps over to recliner.  Pt attempting to assist as much as possible.  Pt also performed LE exercises at EOB prior to mobilizing (prefers to "warm up").  Continue to recommend SNF upon d/c.    Recommendations for follow up therapy are one component of a multi-disciplinary discharge planning process, led by the attending physician.  Recommendations may be updated based on patient status, additional functional criteria and insurance authorization.  Follow Up Recommendations  Skilled nursing-short term rehab (<3 hours/day)     Assistance Recommended at Discharge Frequent or constant Supervision/Assistance  Patient can return home with the following A lot of help with walking and/or transfers;A lot of help with bathing/dressing/bathroom   Equipment Recommendations  None recommended by PT    Recommendations for Other Services       Precautions / Restrictions Precautions Precautions: Fall Restrictions RLE Weight Bearing: Weight bearing as tolerated     Mobility  Bed Mobility Overal bed mobility: Needs Assistance Bed Mobility: Supine to Sit     Supine to sit: Min assist;HOB elevated     General bed mobility comments: light assist to scoot to EOB, increased time and effort as pt attempting to perform on her own    Transfers Overall transfer level: Needs assistance Equipment used: Rolling walker (2 wheels) Transfers: Sit to/from  Stand;Bed to chair/wheelchair/BSC Sit to Stand: Min assist;+2 safety/equipment Stand pivot transfers: Mod assist;+2 physical assistance   Step pivot transfers: Min assist;+2 physical assistance;Modified independent (Device/Increase time)     General transfer comment: assist to rise and steady, cues for hand placement however pt prefers her method, assist to control descent, pt assisted to [facility] and then took a few steps over to recliner    Ambulation/Gait                   Stairs             Wheelchair Mobility    Modified Rankin (Stroke Patients Only)       Balance                                            Cognition Arousal/Alertness: Awake/alert Behavior During Therapy: WFL for tasks assessed/performed Overall Cognitive Status: History of cognitive impairments - at baseline                                 General Comments: Self distracting, "wait a minute, wait a minute"        Exercises General Exercises - Lower Extremity Ankle Circles/Pumps: AROM;Both;10 reps;Seated Short Arc Quad: AROM;Both;10 reps;Seated (x2) Hip ABduction/ADduction: AROM;Both;10 reps;Seated Hip Flexion/Marching: AROM;Both;10 reps;Limitations;AAROM;Seated Hip Flexion/Marching Limitations: pt assisted R LE with UEs    General Comments        Pertinent Vitals/Pain Pain  Assessment: Faces Faces Pain Scale: Hurts a little bit Breathing: normal Negative Vocalization: none Facial Expression: smiling or inexpressive Body Language: relaxed Consolability: no need to console PAINAD Score: 0 Pain Location: R hip with movement Pain Descriptors / Indicators: Grimacing;Guarding Pain Intervention(s): Repositioned;Monitored during session    Home Living                          Prior Function            PT Goals (current goals can now be found in the care plan section) Progress towards PT goals: Progressing toward goals     Frequency    Min 2X/week      PT Plan Current plan remains appropriate    Co-evaluation              AM-PAC PT "6 Clicks" Mobility   Outcome Measure  Help needed turning from your back to your side while in a flat bed without using bedrails?: A Lot Help needed moving from lying on your back to sitting on the side of a flat bed without using bedrails?: A Lot Help needed moving to and from a bed to a chair (including a wheelchair)?: A Lot Help needed standing up from a chair using your arms (e.g., wheelchair or bedside chair)?: A Lot Help needed to walk in hospital room?: A Lot Help needed climbing 3-5 steps with a railing? : Total 6 Click Score: 11    End of Session Equipment Utilized During Treatment: Gait belt Activity Tolerance: Patient tolerated treatment well Patient left: in chair;with call bell/phone within reach;with chair alarm set Nurse Communication: Mobility status PT Visit Diagnosis: Difficulty in walking, not elsewhere classified (R26.2);History of falling (Z91.81)     Time: 4010-2725 PT Time Calculation (min) (ACUTE ONLY): 28 min  Charges:  $Therapeutic Activity: 23-37 mins            Jannette Spanner PT, DPT Acute Rehabilitation Services Pager: [phone] Office: [place] 11/25/2021, 3:53 PM

## 2021-11-25 NOTE — Plan of Care (Signed)
°  Problem: Clinical Measurements: Goal: Ability to maintain clinical measurements within normal limits will improve Outcome: Progressing   Problem: Activity: Goal: Risk for activity intolerance will decrease Outcome: Progressing   Problem: Coping: Goal: Level of anxiety will decrease Outcome: Progressing   Problem: Pain Managment: Goal: General experience of comfort will improve Outcome: Progressing   Problem: Safety: Goal: Ability to remain free from injury will improve Outcome: Progressing   Problem: Skin Integrity: Goal: Risk for impaired skin integrity will decrease Outcome: Progressing   

## 2021-11-25 NOTE — Consult Note (Addendum)
[place] Nurse wound follow up Patient receiving care in [id number] Bedside RN and NT were cleaning patient and the RN changed the sacral dressing and will place a new heel foam dressing over the right heel. Patient is refusing prevalon boots Wound type: Evolving DTPI to the coccyx that is now a stage 3, pink and measures 2 x 2.8 x 0.2 Left heel has a 2 x 2 DTPI that is purple Drainage (amount, consistency, odor) None Periwound: intact Dressing procedure/placement/frequency: Continue Xeroform gauze over the coccyx wound and heel foam dressing to the left heel. Change Xeroform daily. Foam dressing may be changed every 3 days but the area should be assessed daily.  Monitor the wound area(s) for worsening of condition such as: Signs/symptoms of infection, increase in size, development of or worsening of odor, development of pain, or increased pain at the affected locations.   Notify the medical team if any of these develop.  Thank you for the consult. [place] nurse will not follow at this time.   Please re-consult the [place] team if needed.  Cathlean Marseilles Tamala Julian, MSN, RN, [place], [place]le [place], [facility] Wound Treatment Associate Pager [phone]

## 2021-11-25 NOTE — Progress Notes (Signed)
Dressing change done to coccyx, previous area of DTI now has pink open area in the center, new xeroform gauze applied, sacral foam put back in place as it was just changed today and is clean and dry, pillows placed under both of patients hip to offload pressure- patient refuses to be turned to either side and remain there, pillow placed under lower legs to elevate heels off of bed, patient agreeable to that at this time, will continue to monitor.

## 2021-11-26 DIAGNOSIS — R1312 Dysphagia, oropharyngeal phase: Secondary | ICD-10-CM | POA: Diagnosis not present

## 2021-11-26 DIAGNOSIS — D62 Acute posthemorrhagic anemia: Secondary | ICD-10-CM | POA: Diagnosis not present

## 2021-11-26 DIAGNOSIS — L89626 Pressure-induced deep tissue damage of left heel: Secondary | ICD-10-CM | POA: Diagnosis not present

## 2021-11-26 DIAGNOSIS — Z853 Personal history of malignant neoplasm of breast: Secondary | ICD-10-CM | POA: Diagnosis not present

## 2021-11-26 DIAGNOSIS — S72141D Displaced intertrochanteric fracture of right femur, subsequent encounter for closed fracture with routine healing: Secondary | ICD-10-CM | POA: Diagnosis not present

## 2021-11-26 DIAGNOSIS — S72011D Unspecified intracapsular fracture of right femur, subsequent encounter for closed fracture with routine healing: Secondary | ICD-10-CM | POA: Diagnosis not present

## 2021-11-26 DIAGNOSIS — R051 Acute cough: Secondary | ICD-10-CM | POA: Diagnosis not present

## 2021-11-26 DIAGNOSIS — L89153 Pressure ulcer of sacral region, stage 3: Secondary | ICD-10-CM | POA: Diagnosis not present

## 2021-11-26 DIAGNOSIS — G894 Chronic pain syndrome: Secondary | ICD-10-CM | POA: Diagnosis not present

## 2021-11-26 DIAGNOSIS — R0989 Other specified symptoms and signs involving the circulatory and respiratory systems: Secondary | ICD-10-CM | POA: Diagnosis not present

## 2021-11-26 DIAGNOSIS — Z1159 Encounter for screening for other viral diseases: Secondary | ICD-10-CM | POA: Diagnosis not present

## 2021-11-26 DIAGNOSIS — U071 COVID-19: Secondary | ICD-10-CM | POA: Diagnosis not present

## 2021-11-26 DIAGNOSIS — J309 Allergic rhinitis, unspecified: Secondary | ICD-10-CM | POA: Diagnosis not present

## 2021-11-26 DIAGNOSIS — S72001D Fracture of unspecified part of neck of right femur, subsequent encounter for closed fracture with routine healing: Secondary | ICD-10-CM | POA: Diagnosis not present

## 2021-11-26 DIAGNOSIS — Z7401 Bed confinement status: Secondary | ICD-10-CM | POA: Diagnosis not present

## 2021-11-26 DIAGNOSIS — M6281 Muscle weakness (generalized): Secondary | ICD-10-CM | POA: Diagnosis not present

## 2021-11-26 DIAGNOSIS — M79604 Pain in right leg: Secondary | ICD-10-CM | POA: Diagnosis not present

## 2021-11-26 DIAGNOSIS — I10 Essential (primary) hypertension: Secondary | ICD-10-CM | POA: Diagnosis not present

## 2021-11-26 DIAGNOSIS — J188 Other pneumonia, unspecified organism: Secondary | ICD-10-CM | POA: Diagnosis not present

## 2021-11-26 DIAGNOSIS — M162 Bilateral osteoarthritis resulting from hip dysplasia: Secondary | ICD-10-CM | POA: Diagnosis not present

## 2021-11-26 DIAGNOSIS — R262 Difficulty in walking, not elsewhere classified: Secondary | ICD-10-CM | POA: Diagnosis not present

## 2021-11-26 DIAGNOSIS — K5900 Constipation, unspecified: Secondary | ICD-10-CM | POA: Diagnosis not present

## 2021-11-26 DIAGNOSIS — I517 Cardiomegaly: Secondary | ICD-10-CM | POA: Diagnosis not present

## 2021-11-26 DIAGNOSIS — K219 Gastro-esophageal reflux disease without esophagitis: Secondary | ICD-10-CM | POA: Diagnosis not present

## 2021-11-26 DIAGNOSIS — J189 Pneumonia, unspecified organism: Secondary | ICD-10-CM | POA: Diagnosis not present

## 2021-11-26 DIAGNOSIS — S72001A Fracture of unspecified part of neck of right femur, initial encounter for closed fracture: Secondary | ICD-10-CM | POA: Diagnosis not present

## 2021-11-26 DIAGNOSIS — E46 Unspecified protein-calorie malnutrition: Secondary | ICD-10-CM | POA: Diagnosis not present

## 2021-11-26 DIAGNOSIS — T07XXXA Unspecified multiple injuries, initial encounter: Secondary | ICD-10-CM | POA: Diagnosis not present

## 2021-11-26 DIAGNOSIS — R3981 Functional urinary incontinence: Secondary | ICD-10-CM | POA: Diagnosis not present

## 2021-11-26 DIAGNOSIS — L89620 Pressure ulcer of left heel, unstageable: Secondary | ICD-10-CM | POA: Diagnosis not present

## 2021-11-26 DIAGNOSIS — N390 Urinary tract infection, site not specified: Secondary | ICD-10-CM | POA: Diagnosis not present

## 2021-11-26 DIAGNOSIS — G9341 Metabolic encephalopathy: Secondary | ICD-10-CM | POA: Diagnosis not present

## 2021-11-26 DIAGNOSIS — R531 Weakness: Secondary | ICD-10-CM | POA: Diagnosis not present

## 2021-11-26 DIAGNOSIS — R079 Chest pain, unspecified: Secondary | ICD-10-CM | POA: Diagnosis not present

## 2021-11-26 DIAGNOSIS — M79672 Pain in left foot: Secondary | ICD-10-CM | POA: Diagnosis not present

## 2021-11-26 DIAGNOSIS — M25551 Pain in right hip: Secondary | ICD-10-CM | POA: Diagnosis not present

## 2021-11-26 DIAGNOSIS — Z8616 Personal history of COVID-19: Secondary | ICD-10-CM | POA: Diagnosis not present

## 2021-11-26 DIAGNOSIS — I5032 Chronic diastolic (congestive) heart failure: Secondary | ICD-10-CM | POA: Diagnosis not present

## 2021-11-26 DIAGNOSIS — L89623 Pressure ulcer of left heel, stage 3: Secondary | ICD-10-CM | POA: Diagnosis not present

## 2021-11-26 MED ORDER — FUROSEMIDE 20 MG PO TABS: 10.0000 mg | ORAL_TABLET | ORAL | 0 refills | Status: DC | PRN

## 2021-11-26 MED ORDER — ENOXAPARIN SODIUM 30 MG/0.3ML IJ SOSY: 30.0000 mg | PREFILLED_SYRINGE | INTRAMUSCULAR | Status: DC

## 2021-11-26 MED ORDER — CARVEDILOL 3.125 MG PO TABS: 3.1250 mg | ORAL_TABLET | Freq: Two times a day (BID) | ORAL | Status: DC

## 2021-11-26 NOTE — Plan of Care (Signed)
Pt ready to be transferred to [facility].

## 2021-11-26 NOTE — Care Management Important Message (Signed)
Important Message  Patient Details IM Letter given to the Patient. Name: Heather [name] MRN: [id number] Date of Birth: 09-May-1918   Medicare Important Message Given:  Yes     Kerin Salen 11/26/2021, 11:56 AM

## 2021-11-26 NOTE — Discharge Summary (Addendum)
Physician Discharge Summary   Patient: Heather [name] MRN: [id number] DOB: 04-Aug-1918  Admit date:     11/11/2021  Discharge date: 11/26/21  Discharge Physician: Murray Hodgkins   PCP: Sandrea Hughs, NP   Recommendations at discharge:    Follow-up hip fracture, WBAT, Lovenox for DVT prophylaxis, f/u with Dr. Sammuel Hines around 1/11 for staple removal.  Patient has not required any pain medication postoperatively therefore no pain medication on discharge.  Patient has not required Xanax in 14 days hospitalized, therefore discontinue given fall risk.  Discharge Diagnoses Principal Problem:   Closed displaced fracture of right femoral neck (HCC) Active Problems:   ABLA   Primary hypertension   Chronic diastolic CHF (congestive heart failure) (HCC)   GERD without esophagitis   Pressure injury of skin   Acute metabolic encephalopathy   Lab test positive for detection of COVID-19 virus  Hospital Course   88 year old woman sustained hip fracture status post mechanical fall.  Status post surgery 12/28 with expected perioperative blood loss.  Hospitalization complicated by mild acute metabolic encephalopathy and delirium.  Seen by PT with recommendation for SNF.  Incidental COVID-positive which prolonged hospitalization as facilities require 10-day quarantine prior to transfer.  Completed 10-day quarantine. Plan transfer to SNF.  * Closed displaced fracture of right femoral neck ([place])- (present on admission) -- Status post right cephulomedullary nail 12/28.   -- WBAT, Lovenox for DVT prophylaxis x30 days --f/u with Dr. Sammuel Hines around 1/11 for staple removal  ABLA -- Perioperative in nature, expected, stable.  No further evaluation suggested.  Asymptomatic.  Chronic diastolic CHF (congestive heart failure) ([place])- (present on admission) -- Appears euvolemic.  Lasix stopped secondary to hypotension.  Monitor volume status daily and daily weights. Resume Lasix as needed. Keep legs  elevated.  Continue carvedilol as blood pressure tolerates.  Primary hypertension- (present on admission) -- May no longer be an operative diagnosis.  May not be able to tolerate carvedilol, for now lowest dose, but consider r/b longterm based on CHF and BP.   Acute metabolic encephalopathy -- Intermittent delirium and metabolic encephalopathy during hospitalization, resolved at this time.    -- Was seen by psychiatry earlier in the hospitalization for delirium and capacity assessment  Pressure injury of skin- (present on admission) -- DTPI to left heel, DTPI in evolution to coccyx Wound type:Pressure Pressure Injury POA: Yes Measurement:Per Nursing Flow Sheet  Left heel: 4cm x 2cm  Coccyx: 4cm x 3cm x 0.1cm Wound bed: Left heel: Purple discoloration, intact Coccyx: Purple, sloughing skin loss area is red tissue Drainage (amount, consistency, odor) serous Periwound: intact Dressing procedure/placement/frequency: Patient is able to turn and reposition in bed and has been up with PT. Bilateral pressure redistribution heel boots and a pressure redistribution chair cushion for the times when she is OOB to chair.   GERD without esophagitis- (present on admission) -- Continue Pepcid  Lab test positive for detection of COVID-19 virus -- Incidental, no symptoms, did not require any treatment.  Completed 10-day quarantine 1/6.  Precautions discontinued.      Consultants: orthopedics  Procedures performed: Right cephulomedullary nail Disposition: Skilled nursing facility Diet recommendation: Regular diet  DISCHARGE MEDICATION: Allergies as of 11/26/2021       Reactions   Fentanyl Nausea Only, Other (See Comments)   Pt states that she is not allergic     Naprosyn [naproxen] Other (See Comments)   Patient felt tongue and face swelling. Went to ED where no visible swelling noted  Medication List     STOP taking these medications    ALPRAZolam 0.25 MG tablet Commonly  known as: XANAX   spironolactone 25 MG tablet Commonly known as: ALDACTONE       TAKE these medications    acetaminophen 500 MG tablet Commonly known as: TYLENOL Take 500 mg by mouth every 6 (six) hours as needed for moderate pain or headache.   carvedilol 3.125 MG tablet Commonly known as: COREG Take 1 tablet (3.125 mg total) by mouth 2 (two) times daily. Hold for SBP <110 or HR <70 What changed: additional instructions   cholecalciferol 1000 units tablet Commonly known as: VITAMIN D Take 1,000 Units by mouth daily.   enoxaparin 30 MG/0.3ML injection Commonly known as: LOVENOX Inject 0.3 mLs (30 mg total) into the skin daily. Total 30 days for DVT prophylaxis, last dose 12/13/21. Start taking on: November 27, 2021   famotidine 20 MG tablet Commonly known as: PEPCID Take 1 tablet (20 mg total) by mouth daily.   feeding supplement Liqd Take 237 mLs by mouth 2 (two) times daily between meals.   furosemide 20 MG tablet Commonly known as: LASIX Take 0.5 tablets (10 mg total) by mouth every other day as needed for fluid or edema. TAKE 1/2 (ONE-HALF) TABLET BY MOUTH EVERY other DAY What changed:  when to take this reasons to take this   levocetirizine 5 MG tablet Commonly known as: XYZAL Take 1 tablet (5 mg total) by mouth daily.   lidocaine 5 % Commonly known as: LIDODERM Place 1 patch onto the skin as needed. Remove & Discard patch within 12 hours or as directed by MD   multivitamins ther. w/minerals Tabs tablet Take 1 tablet by mouth daily.   polyethylene glycol 17 g packet Commonly known as: MIRALAX / GLYCOLAX Take 17 g by mouth daily.   sennosides-docusate sodium 8.6-50 MG tablet Commonly known as: SENOKOT-S Take 1-2 tablets by mouth daily as needed for constipation.   SYSTANE COMPLETE OP Apply 1 drop to eye daily as needed (dry eyes).   zinc oxide 11.3 % Crea cream Commonly known as: BALMEX Apply 1 application topically 2 (two) times daily as needed.                Discharge Care Instructions  (From admission, onward)           Start     Ordered   11/26/21 0000  Discharge wound care:       Comments: Wound care  Daily      Wound care to left heel DTPI:  Place a heel foam dressing over the bilateral heels and place in Prevalon Boot if patient will allow. Change foam dressing every 3 days. Peel back and assess daily.   Wound care to coccyx: Cleanse with NS, pat dry. Cover with folded piece of xeroform gauze Kellie Simmering # [id number]), top with dry gauze 2x2 and secure with silicone foam for sacrum. Orient "tip" of foam away from anus.  Change xeroform daily, may reuse foam for up to 3 days. Change PRN soiling. Turn patient side to side while in bed and minimize time in the supine position.   11/26/21 1206            Contact information for follow-up providers     Vanetta Mulders, MD. Schedule an appointment as soon as possible for a visit in 2 day(s).   Specialty: Orthopedic Surgery Why: for staple removal, follow up of surgery Contact information: [address] [place] [address] [phone]              Contact information for after-discharge care     Destination     [facility] Preferred SNF .   Service: Skilled Nursing Contact information: [address] [place] good Azzie Glatter at bedside  Discharge Exam: Danley Danker Weights   11/11/21 0930  Weight: 49.9 kg   Physical Exam Constitutional:      General: She is not in acute distress.    Appearance: She is not ill-appearing or toxic-appearing.  Cardiovascular:     Rate and Rhythm: Normal rate and regular rhythm.     Heart sounds: No murmur heard. Pulmonary:     Effort: Pulmonary effort is normal. No respiratory distress.     Breath sounds: No wheezing, rhonchi or rales.  Musculoskeletal:     Right lower leg: No edema.     Left lower leg: No edema.   Neurological:     Mental Status: She is alert.  Psychiatric:        Mood and Affect: Mood normal.        Behavior: Behavior normal.     Condition at discharge: good  The results of significant diagnostics from this hospitalization (including imaging, microbiology, ancillary and laboratory) are listed below for reference.   Imaging Studies: CT HEAD WO CONTRAST (5MM)  Result Date: 11/11/2021 CLINICAL DATA:  Altered mentation, unknown cause. EXAM: CT HEAD WITHOUT CONTRAST TECHNIQUE: Contiguous axial images were obtained from the base of the skull through the vertex without intravenous contrast. COMPARISON:  Head CT 07/29/2016. FINDINGS: Brain: There is mild-to-moderate atrophy and atrophic ventriculomegaly with moderate to severe small vessel disease of the cerebral white matter. There are tiny chronic lacunar infarcts again noted in the gangliocapsular areas of both hemispheres and in both thalami. There is mild cerebellar atrophy. There is no asymmetry worrisome for acute infarct, hemorrhage or mass. Dural calcifications are scattered along the falx and tentorium. Vascular: There are calcifications in the distal vertebral arteries and carotid siphons but no hyperdense central vessels. Skull: Normal apart from osteopenia. Negative for fracture or focal lesion. Sinuses/Orbits: There is mild membrane thickening in the ethmoid air cells. Other visible sinuses and mastoid air cells are clear. Old lens replacements are again shown. Other: None. IMPRESSION: No acute intracranial CT findings. There is atrophy with moderate to severe small vessel disease, with some progression in the small vessel disease since the prior study. No other appreciable interval changes. Electronically Signed   By: Telford Nab M.D.   On: 11/11/2021 22:21   DG Chest Port 1 View  Result Date: 11/11/2021 CLINICAL DATA:  Fall EXAM: PORTABLE CHEST 1 VIEW COMPARISON:  12/05/2018 FINDINGS: Rotated AP portable examination. Mild  cardiomegaly. No acute abnormality of the lungs. No obvious displaced rib fracture or other abnormality. IMPRESSION: Rotated AP portable examination. Mild cardiomegaly. No acute abnormality of the lungs. No obvious displaced rib fracture or other abnormality. Electronically Signed   By: Delanna Ahmadi M.D.   On: 11/11/2021 11:42   DG C-Arm 1-60 Min-No Report  Result Date: 11/13/2021 Fluoroscopy was utilized by the requesting physician.  No radiographic interpretation.   DG HIP OPERATIVE UNILAT W OR W/O PELVIS RIGHT  Result Date: 11/13/2021 CLINICAL DATA:  Intraoperative for intramedullary nail EXAM: OPERATIVE right HIP (WITH PELVIS IF PERFORMED) 4 VIEWS TECHNIQUE:  Fluoroscopic spot image(s) were submitted for interpretation post-operatively. COMPARISON:  11/11/2021 FINDINGS: Intraoperative fluoroscopy is utilized for surgical control purposes. Fluoroscopy time is indicated at 1 minute 12 seconds. Dose is 9.3580 mGy. Four spot fluoroscopic images are obtained. Spot fluoroscopic images obtained demonstrate placement of a long intramedullary rod with compression bolt fixation of inter trochanteric fracture of the proximal right femur. A single distal locking screw is identified in the distal femoral metaphysis. Mild residual displacement of the fracture fragments with near anatomic alignment suggested. IMPRESSION: Intraoperative fluoroscopy obtained during surgical control purposes demonstrating internal fixation of an inter trochanteric fracture of the right proximal femur. Electronically Signed   By: Lucienne Capers M.D.   On: 11/13/2021 19:31   DG Hips Bilat W or Wo Pelvis 3-4 Views  Result Date: 11/11/2021 CLINICAL DATA:  Right hip pain after fall EXAM: DG HIP (WITH OR WITHOUT PELVIS) 3-4V BILAT COMPARISON:  10/04/2019 FINDINGS: Acute intertrochanteric fracture of the right femur with mild foreshortening and varus angulation. Lesser trochanteric fragment is slightly displaced. Hip joint intact  without dislocation. No left-sided hip fracture. Diffuse osseous demineralization. Degenerative changes of the bilateral hips and sacroiliac joints. IMPRESSION: Acute mildly displaced and angulated intertrochanteric fracture of the right femur. Electronically Signed   By: Davina Poke D.O.   On: 11/11/2021 11:43    Microbiology: Results for orders placed or performed during the hospital encounter of 11/11/21  Surgical pcr screen     Status: None   Collection Time: 11/12/21  3:37 AM   Specimen: Nasal Mucosa; Nasal Swab  Result Value Ref Range Status   MRSA, PCR NEGATIVE NEGATIVE Final   Staphylococcus aureus NEGATIVE NEGATIVE Final    Comment: (NOTE) The Xpert SA Assay (FDA approved for NASAL specimens in patients 84 years of age and older), is one component of a comprehensive surveillance program. It is not intended to diagnose infection nor to guide or monitor treatment. Performed at [facility], [place] [address] [place], [place] [address]   Resp Panel by RT-PCR (Flu A&B, Covid) Nasopharyngeal Swab     Status: Abnormal   Collection Time: 11/12/21  4:05 AM   Specimen: Nasopharyngeal Swab; Nasopharyngeal(NP) swabs in vial transport medium  Result Value Ref Range Status   SARS Coronavirus 2 by RT PCR POSITIVE (A) NEGATIVE Final    Comment: (NOTE) SARS-CoV-2 target nucleic acids are DETECTED.  The SARS-CoV-2 RNA is generally detectable in upper respiratory specimens during the acute phase of infection. Positive results are indicative of the presence of the identified virus, but do not rule out bacterial infection or co-infection with other pathogens not detected by the test. Clinical correlation with patient history and other diagnostic information is necessary to determine patient infection status. The expected result is Negative.  Fact Sheet for Patients: EntrepreneurPulse.com.au  Fact Sheet for Healthcare  Providers: IncredibleEmployment.be  This test is not yet approved or cleared by the [place] FDA and  has been authorized for detection and/or diagnosis of SARS-CoV-2 by FDA under an Emergency Use Authorization (EUA).  This EUA will remain in effect (meaning this test can be used) for the duration of  the COVID-19 declaration under Section 564(b)(1) of the A ct, 21 U.S.C. section 360bbb-3(b)(1), unless the authorization is terminated or revoked sooner.     Influenza A by PCR NEGATIVE NEGATIVE Final   Influenza B by PCR NEGATIVE NEGATIVE Final    Comment: (NOTE) The Xpert Xpress SARS-CoV-2/FLU/RSV plus assay is intended as an aid in the diagnosis of  influenza from Nasopharyngeal swab specimens and should not be used as a sole basis for treatment. Nasal washings and aspirates are unacceptable for Xpert Xpress SARS-CoV-2/FLU/RSV testing.  Fact Sheet for Patients: EntrepreneurPulse.com.au  Fact Sheet for Healthcare Providers: IncredibleEmployment.be  This test is not yet approved or cleared by the [place] FDA and has been authorized for detection and/or diagnosis of SARS-CoV-2 by FDA under an Emergency Use Authorization (EUA). This EUA will remain in effect (meaning this test can be used) for the duration of the COVID-19 declaration under Section 564(b)(1) of the Act, 21 U.S.C. section 360bbb-3(b)(1), unless the authorization is terminated or revoked.  Performed at [facility], [place] [address], [place], [place] [address]     Labs: CBC: Recent Labs  Lab 11/20/21 0810  WBC 6.5  HGB 9.5*  HCT 29.8*  MCV 96.4  PLT 824   Basic Metabolic Panel: Recent Labs  Lab 11/20/21 0810  NA 137  K 4.5  CL 105  CO2 27  GLUCOSE 107*  BUN 27*  CREATININE 0.73  CALCIUM 8.4*  MG 2.1  PHOS 3.2   Liver Function Tests: No results for input(s): AST, ALT, ALKPHOS, BILITOT, PROT, ALBUMIN in the last  168 hours. CBG: No results for input(s): GLUCAP in the last 168 hours.  Discharge time spent: less than 30 minutes.  Signed: Murray Hodgkins, MD [facility] 11/26/2021

## 2021-11-26 NOTE — Progress Notes (Signed)
Nutrition Follow-up  DOCUMENTATION CODES:   Not applicable  INTERVENTION:  - continue Ensure Enlive BID. - weigh patient today.   NUTRITION DIAGNOSIS:   Increased nutrient needs related to hip fracture as evidenced by estimated needs. -ongoing  GOAL:   Patient will meet greater than or equal to 90% of their needs -met  MONITOR:   PO intake, Supplement acceptance, Labs, Weight trends, I & O's   ASSESSMENT:   88 yo female with a PMH of anxiety, brease cancer, carpal tunnel syndrome, cercical spondylosis, chronic pain syndrome, diverticulosis, gastritis, HTN, low back pain, LPRD, and osteoporosis who presents with closed displaced fracture of R femoral neck.  Patient laying in bed with no visitors present at the time of RD visit. Per review of flow sheet, she has been eating 100% at all meals since breakfast on 1/7. Per review of order, she has been accepting Ensure ~90% of the time offered.   Patient reports eating well for breakfast this AM. She shares that she is capable of setting up meal tray for herself and that she would prefer to do it herself rather than for staff to do it for her. She does not like coffee and shares she does not want to receive orange juice as it causes/worsens acid reflux for her. She prefers apple juice.  Patient requested assistance with covering up with a blanket; assisted her with this.   She has not been weighed since admission on 11/11/21. Non-pitting edema to BLE documented in the edema section of flow sheet.    Notes yesterday indicate possible d/c to [facility] today (1/10).  Labs reviewed; BUN: 27 mg/dl, Ca: 8.4 mg/dl.  Medications reviewed; 20 mg oral pepcid/day, 1 tablet multivitamin with minerals/day, 17 g miralax/day, 2 tablets senokot/day.    NUTRITION - FOCUSED PHYSICAL EXAM:  Flowsheet Row Most Recent Value  Orbital Region Mild depletion  Upper Arm Region Moderate depletion  Thoracic and Lumbar Region Unable to assess   Buccal Region Mild depletion  Temple Region Mild depletion  Clavicle Bone Region Moderate depletion  Clavicle and Acromion Bone Region Moderate depletion  Scapular Bone Region Unable to assess  Dorsal Hand Moderate depletion  Patellar Region Unable to assess  Anterior Thigh Region Unable to assess  Posterior Calf Region Unable to assess  Edema (RD Assessment) Unable to assess  Hair Reviewed  Eyes Reviewed  Mouth Reviewed  [no teeth,  has dentures]  Skin Reviewed  Nails Reviewed       Diet Order:   Diet Order             Diet regular Room service appropriate? Yes; Fluid consistency: Thin  Diet effective now                   EDUCATION NEEDS:   Not appropriate for education at this time  Skin:  Skin Assessment: Skin Integrity Issues: Skin Integrity Issues:: DTI, Incisions, Stage III DTI: L heel (newly documented 1/2) Stage III: coccyx (newly documented 1/2) Incisions: R hip (newly documented 12/28)  Last BM:  1/9 (type 4)  Height:   Ht Readings from Last 1 Encounters:  11/11/21 $RemoveB'4\' 10"'[id number]$  (1.473 m)    Weight:   Wt Readings from Last 1 Encounters:  11/11/21 49.9 kg     Estimated Nutritional Needs:  Kcal:  1400-1600 Protein:  75-90 grams Fluid:  >/= 1.8 L/day     Jarome Matin, MS, RD, LDN Inpatient Clinical Dietitian RD pager # available in [facility]  After hours/weekend  pager # available in [facility]

## 2021-11-26 NOTE — TOC Transition Note (Signed)
Transition of Care [facility]) - CM/SW Discharge Note  Patient Details  Name: Heather [name] MRN: [id number] Date of Birth: 12-07-1917  Transition of [facility]) CM/SW Contact:  Sherie Don, LCSW Phone Number: 11/26/2021, 12:37 PM  Clinical Narrative: Patient is medically stable for discharge to [facility]. Patient will go room 128 and the number for report is [phone]. Discharge summary, discharge orders, and SNF transfer report faxed to facility in hub.  Medical necessity form done; PTAR scheduled. Discharge packet completed. CSW notified patient's son, Kenny Stern, of discharge. RN updated. TOC signing off.  Final next level of care: Skilled Nursing Facility Barriers to Discharge: Barriers Resolved  Patient Goals and CMS Choice Patient states their goals for this hospitalization and ongoing recovery are:: Go to rehab CMS Medicare.gov Compare Post Acute Care list provided to:: Patient Represenative (must comment) Choice offered to / list presented to : Adult Children  Discharge Placement PASRR number recieved: 11/15/21       Patient [facility] bed at: [facility] Patient to be transferred to facility by: [facility] Name of family member notified: Issis Lindseth (son) Patient and family notified of of transfer: 11/26/21  Discharge Plan and Services In-house Referral: Clinical Social Work Post Acute Care Choice: [facility]          DME Arranged: N/A DME Agency: NA  Readmission Risk Interventions No flowsheet data found.

## 2021-11-27 DIAGNOSIS — L89626 Pressure-induced deep tissue damage of left heel: Secondary | ICD-10-CM | POA: Diagnosis not present

## 2021-11-27 DIAGNOSIS — L89153 Pressure ulcer of sacral region, stage 3: Secondary | ICD-10-CM | POA: Diagnosis not present

## 2021-11-28 DIAGNOSIS — R531 Weakness: Secondary | ICD-10-CM | POA: Diagnosis not present

## 2021-11-28 DIAGNOSIS — I10 Essential (primary) hypertension: Secondary | ICD-10-CM | POA: Diagnosis not present

## 2021-11-28 DIAGNOSIS — S72011D Unspecified intracapsular fracture of right femur, subsequent encounter for closed fracture with routine healing: Secondary | ICD-10-CM | POA: Diagnosis not present

## 2021-11-28 DIAGNOSIS — G9341 Metabolic encephalopathy: Secondary | ICD-10-CM | POA: Diagnosis not present

## 2021-11-29 ENCOUNTER — Other Ambulatory Visit (HOSPITAL_BASED_OUTPATIENT_CLINIC_OR_DEPARTMENT_OTHER): Payer: Self-pay | Admitting: Orthopaedic Surgery

## 2021-11-29 ENCOUNTER — Ambulatory Visit (HOSPITAL_BASED_OUTPATIENT_CLINIC_OR_DEPARTMENT_OTHER): Payer: Medicare Other | Admitting: Orthopaedic Surgery

## 2021-11-29 DIAGNOSIS — S72001A Fracture of unspecified part of neck of right femur, initial encounter for closed fracture: Secondary | ICD-10-CM

## 2021-12-02 DIAGNOSIS — M25551 Pain in right hip: Secondary | ICD-10-CM | POA: Diagnosis not present

## 2021-12-02 DIAGNOSIS — J309 Allergic rhinitis, unspecified: Secondary | ICD-10-CM | POA: Diagnosis not present

## 2021-12-02 DIAGNOSIS — L89626 Pressure-induced deep tissue damage of left heel: Secondary | ICD-10-CM | POA: Diagnosis not present

## 2021-12-02 DIAGNOSIS — R051 Acute cough: Secondary | ICD-10-CM | POA: Diagnosis not present

## 2021-12-02 DIAGNOSIS — L89153 Pressure ulcer of sacral region, stage 3: Secondary | ICD-10-CM | POA: Diagnosis not present

## 2021-12-02 DIAGNOSIS — R0989 Other specified symptoms and signs involving the circulatory and respiratory systems: Secondary | ICD-10-CM | POA: Diagnosis not present

## 2021-12-03 DIAGNOSIS — R051 Acute cough: Secondary | ICD-10-CM | POA: Diagnosis not present

## 2021-12-03 DIAGNOSIS — J189 Pneumonia, unspecified organism: Secondary | ICD-10-CM | POA: Diagnosis not present

## 2021-12-03 DIAGNOSIS — R0989 Other specified symptoms and signs involving the circulatory and respiratory systems: Secondary | ICD-10-CM | POA: Diagnosis not present

## 2021-12-03 DIAGNOSIS — M25551 Pain in right hip: Secondary | ICD-10-CM | POA: Diagnosis not present

## 2021-12-04 ENCOUNTER — Ambulatory Visit (INDEPENDENT_AMBULATORY_CARE_PROVIDER_SITE_OTHER): Payer: Medicare Other | Admitting: Orthopaedic Surgery

## 2021-12-04 ENCOUNTER — Ambulatory Visit (HOSPITAL_BASED_OUTPATIENT_CLINIC_OR_DEPARTMENT_OTHER)
Admission: RE | Admit: 2021-12-04 | Discharge: 2021-12-04 | Disposition: A | Discharge status: Home / Self Care | Payer: Medicare Other | Source: Ambulatory Visit | Attending: Orthopaedic Surgery | Admitting: Orthopaedic Surgery

## 2021-12-04 ENCOUNTER — Other Ambulatory Visit: Payer: Self-pay

## 2021-12-04 DIAGNOSIS — S72001A Fracture of unspecified part of neck of right femur, initial encounter for closed fracture: Secondary | ICD-10-CM | POA: Insufficient documentation

## 2021-12-04 NOTE — Progress Notes (Signed)
Post Operative Evaluation    Procedure/Date of Surgery: Right hip cephalomedullary nail November 05, 2021  Interval History:    Presents today overall doing extremely well.  She has been able to walk as much is 30 feet using a walker.  She has been out of bed daily with physical therapy.  She is here today with her son.  States her pain is overall minimal  PMH/PSH/Family History/Social History/Meds/Allergies:    Past Medical History:  Diagnosis Date   Abdominal pain, left lower quadrant 06/22/2013   Abnormal CT scan of lung 07/16/2015   Allergic rhinitis, cause unspecified 02/21/2014   Anxiety state, unspecified    Breast cancer ([place])    NO STICK OR BLOOD PRESSURE CHECKS IN LEFT ARM   Carpal tunnel syndrome    Carpal tunnel syndrome on both sides 10/03/2008   Qualifier: Diagnosis of  By: Lenna Gilford MD, Deborra Medina    Cervical spondylosis    Chronic pain syndrome 03/03/2019   Colon polyp 12/19/2009   Transverse-polypoid colorectal mucosa   Constipation    Cough 10/21/2014   Degenerative joint disease    Diastolic dysfunction    Diverticula of colon    Diverticulosis of colon 05/24/2002   Qualifier: Diagnosis of  By: Jerral Ralph     Gastritis, chronic    Hiatal hernia    Hypertension    Low back pain    LPRD (laryngopharyngeal reflux disease) 04/04/2014   Osteoporosis 05/22/2020   RBBB (right bundle branch block with left anterior fascicular block)    Renal cyst    Venous insufficiency    Past Surgical History:  Procedure Laterality Date   CARPAL TUNNEL RELEASE     INTRAMEDULLARY (IM) NAIL INTERTROCHANTERIC Right 11/13/2021   Procedure: INTRAMEDULLARY (IM) NAIL INTERTROCHANTRIC;  Surgeon: Vanetta Mulders, MD;  Location: [facility];  Service: Orthopedics;  Laterality: Right;   Left mastectomy     Social History   Socioeconomic History   Marital status: Widowed    Spouse name: Not on file   Number of children: 1   Years of education: Not on  file   Highest education level: Not on file  Occupational History   Occupation: Retired    Fish farm manager: RETIRED   Occupation: Certified Nursing Assistance  Tobacco Use   Smoking status: Never   Smokeless tobacco: Never  Vaping Use   Vaping Use: Never used  Substance and Sexual Activity   Alcohol use: No    Alcohol/week: 0.0 standard drinks   Drug use: No   Sexual activity: Not on file  Other Topics Concern   Not on file  Social History Narrative    Social History 07/09/2017        Patient is hard of hearing, uses a 4 leg cane or walker, on wait list for meals on wheels (12/29/19)   Who lives at home: Patient lives alone in one level home; son lives across the street     Transportation: son and family transport to appointments      Important Relationships Son, Friends Jerlyn Ly and Kalman Shan 07/09/2017    Pets: None 07/09/2017   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 Ducatte, RN, BSN        Casimer Lanius, LCSW   Clinical Social Worker   Updated 12/29/2019                                                                                          Tobacco use, amount per day now: No   Past tobacco use, amount per day: No   How many years did you use tobacco: No   Alcohol use (drinks per week): No   Diet: No   Do you drink/eat things with caffeine: Yes, sometimes.   Marital status: Widow                                  What year were you married?    Do you live in a house, apartment, assisted living, condo, trailer, etc.? House   Is it one or more stories? One.   How many persons live in your home? 1   Do you have pets in your home?( please list) No   Highest Level of education completed?   Current or past profession: Actuary.   Do you exercise? No                                  Type and how often?   Do you have a living will? Yes.   Do you have a DNR form? No                                  If not, do you want to discuss one?   Do you have signed POA/HPOA forms?                        If so, please bring to you appointment      Do you have any difficulty bathing or dressing yourself? No.   Do you have any difficulty preparing food or eating? No.   Do you have any difficulty managing your medications? Yes.   Do you have any difficulty managing your finances? No.   Do you have any difficulty affording your medications?    Social Determinants of Health   Financial Resource Strain: Not on file  Food Insecurity: Not on file  Transportation Needs: Not on file  Physical Activity: Not on file  Stress: Not on file  Social Connections: Not on file   Family History  Problem Relation Age of Onset   Colon cancer Neg Hx    Pancreatic cancer Neg Hx    Esophageal cancer Neg Hx    Stomach cancer Neg Hx    Allergies  Allergen Reactions   Fentanyl Nausea Only and Other (See Comments)    Pt states that she is not allergic     Naprosyn [Naproxen] Other (See Comments)    Patient felt tongue and face swelling. Went to  ED where no visible swelling noted   Current Outpatient Medications  Medication Sig Dispense Refill   acetaminophen (TYLENOL) 500 MG tablet Take 500 mg by mouth every 6 (six) hours as needed for moderate pain or headache.     carvedilol (COREG) 3.125 MG tablet Take 1 tablet (3.125 mg total) by mouth 2 (two) times daily. Hold for SBP <110 or HR <70     cholecalciferol (VITAMIN D) 1000 units tablet Take 1,000 Units by mouth daily.     enoxaparin (LOVENOX) 30 MG/0.3ML injection Inject 0.3 mLs (30 mg total) into the skin daily. Total 30 days for DVT prophylaxis, last dose 12/13/21.     famotidine (PEPCID) 20 MG tablet Take 1 tablet (20 mg total) by mouth daily. 30 tablet 5   feeding supplement, ENSURE ENLIVE, (ENSURE ENLIVE) LIQD Take 237 mLs by  mouth 2 (two) times daily between meals. 237 mL 12   furosemide (LASIX) 20 MG tablet Take 0.5 tablets (10 mg total) by mouth every other day as needed for fluid or edema. TAKE 1/2 (ONE-HALF) TABLET BY MOUTH EVERY other DAY  0   levocetirizine (XYZAL) 5 MG tablet Take 1 tablet (5 mg total) by mouth daily. 60 tablet 2   lidocaine (LIDODERM) 5 % Place 1 patch onto the skin as needed. Remove & Discard patch within 12 hours or as directed by MD     Multiple Vitamins-Minerals (MULTIVITAMINS THER. W/MINERALS) TABS Take 1 tablet by mouth daily.     polyethylene glycol (MIRALAX / GLYCOLAX) 17 g packet Take 17 g by mouth daily.     Propylene Glycol (SYSTANE COMPLETE OP) Apply 1 drop to eye daily as needed (dry eyes).     sennosides-docusate sodium (SENOKOT-S) 8.6-50 MG tablet Take 1-2 tablets by mouth daily as needed for constipation.     zinc oxide (BALMEX) 11.3 % CREA cream Apply 1 application topically 2 (two) times daily as needed.     No current facility-administered medications for this visit.   No results found.  Review of Systems:   A ROS was performed including pertinent positives and negatives as documented in the HPI.   Musculoskeletal Exam:    There were no vitals taken for this visit.  Right hip incisions well-healed.  Staples removed today.  She is able to extend and flex at the right knee.  Toes warm well perfused  Imaging:    X-rays 2 views right femur: Intact right cephalomedullary nail no evidence of complication there is some interval callus formation  I personally reviewed and interpreted the radiographs.   Assessment:   88 year old female status post right hip cephalomedullary nail.  She is overall doing well she is advancing as tolerated.  She is now walking up to 30 feet.  I would like her to continue to be out of bed multiple times daily without restriction  Plan :    -Return to clinic 4 weeks we will consider transitioning to outpatient physical therapy as  time    I personally saw and evaluated the patient, and participated in the management and treatment plan.  Vanetta Mulders, MD Attending Physician, Orthopedic Surgery  This document was dictated using Dragon voice recognition software. A reasonable attempt at proof reading has been made to minimize errors.

## 2021-12-09 DIAGNOSIS — L89626 Pressure-induced deep tissue damage of left heel: Secondary | ICD-10-CM | POA: Diagnosis not present

## 2021-12-09 DIAGNOSIS — L89153 Pressure ulcer of sacral region, stage 3: Secondary | ICD-10-CM | POA: Diagnosis not present

## 2021-12-10 DIAGNOSIS — R0989 Other specified symptoms and signs involving the circulatory and respiratory systems: Secondary | ICD-10-CM | POA: Diagnosis not present

## 2021-12-10 DIAGNOSIS — R051 Acute cough: Secondary | ICD-10-CM | POA: Diagnosis not present

## 2021-12-10 DIAGNOSIS — R1312 Dysphagia, oropharyngeal phase: Secondary | ICD-10-CM | POA: Diagnosis not present

## 2021-12-10 DIAGNOSIS — R079 Chest pain, unspecified: Secondary | ICD-10-CM | POA: Diagnosis not present

## 2021-12-13 DIAGNOSIS — M79672 Pain in left foot: Secondary | ICD-10-CM | POA: Diagnosis not present

## 2021-12-13 DIAGNOSIS — R051 Acute cough: Secondary | ICD-10-CM | POA: Diagnosis not present

## 2021-12-13 DIAGNOSIS — R0989 Other specified symptoms and signs involving the circulatory and respiratory systems: Secondary | ICD-10-CM | POA: Diagnosis not present

## 2021-12-13 DIAGNOSIS — U071 COVID-19: Secondary | ICD-10-CM | POA: Diagnosis not present

## 2021-12-16 DIAGNOSIS — L89153 Pressure ulcer of sacral region, stage 3: Secondary | ICD-10-CM | POA: Diagnosis not present

## 2021-12-16 DIAGNOSIS — L89626 Pressure-induced deep tissue damage of left heel: Secondary | ICD-10-CM | POA: Diagnosis not present

## 2021-12-20 DIAGNOSIS — M25551 Pain in right hip: Secondary | ICD-10-CM | POA: Diagnosis not present

## 2021-12-20 DIAGNOSIS — U071 COVID-19: Secondary | ICD-10-CM | POA: Diagnosis not present

## 2021-12-20 DIAGNOSIS — M79672 Pain in left foot: Secondary | ICD-10-CM | POA: Diagnosis not present

## 2021-12-20 DIAGNOSIS — L89620 Pressure ulcer of left heel, unstageable: Secondary | ICD-10-CM | POA: Diagnosis not present

## 2021-12-23 DIAGNOSIS — L89153 Pressure ulcer of sacral region, stage 3: Secondary | ICD-10-CM | POA: Diagnosis not present

## 2021-12-23 DIAGNOSIS — L89626 Pressure-induced deep tissue damage of left heel: Secondary | ICD-10-CM | POA: Diagnosis not present

## 2021-12-30 DIAGNOSIS — L89623 Pressure ulcer of left heel, stage 3: Secondary | ICD-10-CM | POA: Diagnosis not present

## 2022-01-01 DIAGNOSIS — M6281 Muscle weakness (generalized): Secondary | ICD-10-CM | POA: Diagnosis not present

## 2022-01-01 DIAGNOSIS — M79672 Pain in left foot: Secondary | ICD-10-CM | POA: Diagnosis not present

## 2022-01-01 DIAGNOSIS — R262 Difficulty in walking, not elsewhere classified: Secondary | ICD-10-CM | POA: Diagnosis not present

## 2022-01-06 ENCOUNTER — Encounter (HOSPITAL_BASED_OUTPATIENT_CLINIC_OR_DEPARTMENT_OTHER): Payer: Medicare Other | Admitting: Orthopaedic Surgery

## 2022-01-06 ENCOUNTER — Other Ambulatory Visit (HOSPITAL_BASED_OUTPATIENT_CLINIC_OR_DEPARTMENT_OTHER): Payer: Self-pay | Admitting: Orthopaedic Surgery

## 2022-01-06 DIAGNOSIS — S72001A Fracture of unspecified part of neck of right femur, initial encounter for closed fracture: Secondary | ICD-10-CM

## 2022-01-06 DIAGNOSIS — L89153 Pressure ulcer of sacral region, stage 3: Secondary | ICD-10-CM | POA: Diagnosis not present

## 2022-01-06 DIAGNOSIS — G894 Chronic pain syndrome: Secondary | ICD-10-CM | POA: Diagnosis not present

## 2022-01-06 DIAGNOSIS — L89623 Pressure ulcer of left heel, stage 3: Secondary | ICD-10-CM | POA: Diagnosis not present

## 2022-01-07 DIAGNOSIS — M6281 Muscle weakness (generalized): Secondary | ICD-10-CM | POA: Diagnosis not present

## 2022-01-07 DIAGNOSIS — R262 Difficulty in walking, not elsewhere classified: Secondary | ICD-10-CM | POA: Diagnosis not present

## 2022-01-07 DIAGNOSIS — M79672 Pain in left foot: Secondary | ICD-10-CM | POA: Diagnosis not present

## 2022-01-10 DIAGNOSIS — G894 Chronic pain syndrome: Secondary | ICD-10-CM | POA: Diagnosis not present

## 2022-01-10 DIAGNOSIS — M6281 Muscle weakness (generalized): Secondary | ICD-10-CM | POA: Diagnosis not present

## 2022-01-10 DIAGNOSIS — I5032 Chronic diastolic (congestive) heart failure: Secondary | ICD-10-CM | POA: Diagnosis not present

## 2022-01-10 DIAGNOSIS — I10 Essential (primary) hypertension: Secondary | ICD-10-CM | POA: Diagnosis not present

## 2022-01-12 DIAGNOSIS — Z9181 History of falling: Secondary | ICD-10-CM | POA: Diagnosis not present

## 2022-01-12 DIAGNOSIS — L89623 Pressure ulcer of left heel, stage 3: Secondary | ICD-10-CM | POA: Diagnosis not present

## 2022-01-12 DIAGNOSIS — S72001D Fracture of unspecified part of neck of right femur, subsequent encounter for closed fracture with routine healing: Secondary | ICD-10-CM | POA: Diagnosis not present

## 2022-01-12 DIAGNOSIS — Z8616 Personal history of COVID-19: Secondary | ICD-10-CM | POA: Diagnosis not present

## 2022-01-12 DIAGNOSIS — Z79899 Other long term (current) drug therapy: Secondary | ICD-10-CM | POA: Diagnosis not present

## 2022-01-12 DIAGNOSIS — M80051D Age-related osteoporosis with current pathological fracture, right femur, subsequent encounter for fracture with routine healing: Secondary | ICD-10-CM | POA: Diagnosis not present

## 2022-01-12 DIAGNOSIS — Z4800 Encounter for change or removal of nonsurgical wound dressing: Secondary | ICD-10-CM | POA: Diagnosis not present

## 2022-01-12 DIAGNOSIS — M1990 Unspecified osteoarthritis, unspecified site: Secondary | ICD-10-CM | POA: Diagnosis not present

## 2022-01-12 DIAGNOSIS — I5032 Chronic diastolic (congestive) heart failure: Secondary | ICD-10-CM | POA: Diagnosis not present

## 2022-01-12 DIAGNOSIS — I872 Venous insufficiency (chronic) (peripheral): Secondary | ICD-10-CM | POA: Diagnosis not present

## 2022-01-12 DIAGNOSIS — M47812 Spondylosis without myelopathy or radiculopathy, cervical region: Secondary | ICD-10-CM | POA: Diagnosis not present

## 2022-01-12 DIAGNOSIS — G894 Chronic pain syndrome: Secondary | ICD-10-CM | POA: Diagnosis not present

## 2022-01-12 DIAGNOSIS — F419 Anxiety disorder, unspecified: Secondary | ICD-10-CM | POA: Diagnosis not present

## 2022-01-12 DIAGNOSIS — W19XXXD Unspecified fall, subsequent encounter: Secondary | ICD-10-CM | POA: Diagnosis not present

## 2022-01-12 DIAGNOSIS — K5900 Constipation, unspecified: Secondary | ICD-10-CM | POA: Diagnosis not present

## 2022-01-12 DIAGNOSIS — I110 Hypertensive heart disease with heart failure: Secondary | ICD-10-CM | POA: Diagnosis not present

## 2022-01-16 ENCOUNTER — Ambulatory Visit (INDEPENDENT_AMBULATORY_CARE_PROVIDER_SITE_OTHER): Payer: Medicare Other | Admitting: Orthopaedic Surgery

## 2022-01-16 ENCOUNTER — Other Ambulatory Visit: Payer: Self-pay

## 2022-01-16 ENCOUNTER — Ambulatory Visit (HOSPITAL_BASED_OUTPATIENT_CLINIC_OR_DEPARTMENT_OTHER)
Admission: RE | Admit: 2022-01-16 | Discharge: 2022-01-16 | Disposition: A | Discharge status: Home / Self Care | Payer: Medicare Other | Source: Ambulatory Visit | Attending: Orthopaedic Surgery | Admitting: Orthopaedic Surgery

## 2022-01-16 DIAGNOSIS — S72001D Fracture of unspecified part of neck of right femur, subsequent encounter for closed fracture with routine healing: Secondary | ICD-10-CM | POA: Diagnosis not present

## 2022-01-16 DIAGNOSIS — M6281 Muscle weakness (generalized): Secondary | ICD-10-CM | POA: Diagnosis not present

## 2022-01-16 DIAGNOSIS — S7291XD Unspecified fracture of right femur, subsequent encounter for closed fracture with routine healing: Secondary | ICD-10-CM | POA: Diagnosis not present

## 2022-01-16 DIAGNOSIS — S72001A Fracture of unspecified part of neck of right femur, initial encounter for closed fracture: Secondary | ICD-10-CM

## 2022-01-16 NOTE — Progress Notes (Signed)
Post Operative Evaluation    Procedure/Date of Surgery: Right hip cephalomedullary nail November 05, 2021  Interval History:   Presents today for follow-up.  She is overall doing very well.  She has been able to ambulate with the use of a walker.  She states that she has she feels like the right leg is doing better than her left leg at this point.  PMH/PSH/Family History/Social History/Meds/Allergies:    Past Medical History:  Diagnosis Date   Abdominal pain, left lower quadrant 06/22/2013   Abnormal CT scan of lung 07/16/2015   Allergic rhinitis, cause unspecified 02/21/2014   Anxiety state, unspecified    Breast cancer ([place])    NO STICK OR BLOOD PRESSURE CHECKS IN LEFT ARM   Carpal tunnel syndrome    Carpal tunnel syndrome on both sides 10/03/2008   Qualifier: Diagnosis of  By: Lenna Gilford MD, Deborra Medina    Cervical spondylosis    Chronic pain syndrome 03/03/2019   Colon polyp 12/19/2009   Transverse-polypoid colorectal mucosa   Constipation    Cough 10/21/2014   Degenerative joint disease    Diastolic dysfunction    Diverticula of colon    Diverticulosis of colon 05/24/2002   Qualifier: Diagnosis of  By: Jerral Ralph     Gastritis, chronic    Hiatal hernia    Hypertension    Low back pain    LPRD (laryngopharyngeal reflux disease) 04/04/2014   Osteoporosis 05/22/2020   RBBB (right bundle branch block with left anterior fascicular block)    Renal cyst    Venous insufficiency    Past Surgical History:  Procedure Laterality Date   CARPAL TUNNEL RELEASE     INTRAMEDULLARY (IM) NAIL INTERTROCHANTERIC Right 11/13/2021   Procedure: INTRAMEDULLARY (IM) NAIL INTERTROCHANTRIC;  Surgeon: Vanetta Mulders, MD;  Location: [facility];  Service: Orthopedics;  Laterality: Right;   Left mastectomy     Social History   Socioeconomic History   Marital status: Widowed    Spouse name: Not on file   Number of children: 1   Years of education: Not on file    Highest education level: Not on file  Occupational History   Occupation: Retired    Fish farm manager: RETIRED   Occupation: Certified Nursing Assistance  Tobacco Use   Smoking status: Never   Smokeless tobacco: Never  Vaping Use   Vaping Use: Never used  Substance and Sexual Activity   Alcohol use: No    Alcohol/week: 0.0 standard drinks   Drug use: No   Sexual activity: Not on file  Other Topics Concern   Not on file  Social History Narrative    Social History 07/09/2017        Patient is hard of hearing, uses a 4 leg cane or walker, on wait list for meals on wheels (12/29/19)   Who lives at home: Patient lives alone in one level home; son lives across the street     Transportation: son and family transport to appointments      Important Relationships Son, Friends Jerlyn Ly and Kalman Shan 07/09/2017    Pets: None 07/09/2017   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 Ducatte, RN, BSN        Casimer Lanius, LCSW   Clinical Social Worker   Updated 12/29/2019                                                                                          Tobacco use, amount per day now: No   Past tobacco use, amount per day: No   How many years did you use tobacco: No   Alcohol use (drinks per week): No   Diet: No   Do you drink/eat things with caffeine: Yes, sometimes.   Marital status: Widow                                  What year were you married?    Do you live in a house, apartment, assisted living, condo, trailer, etc.? House   Is it one or more stories? One.   How many persons live in your home? 1   Do you have pets in your home?( please list) No   Highest Level of education completed?   Current or past profession: Actuary.   Do you exercise? No                                  Type and how often?   Do you have a living will? Yes.   Do you have a DNR form? No                                  If not, do you want to discuss one?   Do you have signed POA/HPOA forms?                        If so, please bring to you appointment      Do you have any difficulty bathing or dressing yourself? No.   Do you have any difficulty preparing food or eating? No.   Do you have any difficulty managing your medications? Yes.   Do you have any difficulty managing your finances? No.   Do you have any difficulty affording your medications?    Social Determinants of Health   Financial Resource Strain: Not on file  Food Insecurity: Not on file  Transportation Needs: Not on file  Physical Activity: Not on file  Stress: Not on file  Social Connections: Not on file   Family History  Problem Relation Age of Onset   Colon cancer Neg Hx    Pancreatic cancer Neg Hx    Esophageal cancer Neg Hx    Stomach cancer Neg Hx    Allergies  Allergen Reactions   Fentanyl Nausea Only and Other (See Comments)    Pt states that she is not allergic     Naprosyn [Naproxen] Other (See Comments)    Patient felt tongue and face swelling. Went to ED where  no visible swelling noted   Current Outpatient Medications  Medication Sig Dispense Refill   acetaminophen (TYLENOL) 500 MG tablet Take 500 mg by mouth every 6 (six) hours as needed for moderate pain or headache.     carvedilol (COREG) 3.125 MG tablet Take 1 tablet (3.125 mg total) by mouth 2 (two) times daily. Hold for SBP <110 or HR <70     cholecalciferol (VITAMIN D) 1000 units tablet Take 1,000 Units by mouth daily.     enoxaparin (LOVENOX) 30 MG/0.3ML injection Inject 0.3 mLs (30 mg total) into the skin daily. Total 30 days for DVT prophylaxis, last dose 12/13/21.     famotidine (PEPCID) 20 MG tablet Take 1 tablet (20 mg total) by mouth daily. 30 tablet 5   feeding supplement, ENSURE ENLIVE, (ENSURE ENLIVE) LIQD Take 237 mLs by mouth 2  (two) times daily between meals. 237 mL 12   furosemide (LASIX) 20 MG tablet Take 0.5 tablets (10 mg total) by mouth every other day as needed for fluid or edema. TAKE 1/2 (ONE-HALF) TABLET BY MOUTH EVERY other DAY  0   levocetirizine (XYZAL) 5 MG tablet Take 1 tablet (5 mg total) by mouth daily. 60 tablet 2   lidocaine (LIDODERM) 5 % Place 1 patch onto the skin as needed. Remove & Discard patch within 12 hours or as directed by MD     Multiple Vitamins-Minerals (MULTIVITAMINS THER. W/MINERALS) TABS Take 1 tablet by mouth daily.     polyethylene glycol (MIRALAX / GLYCOLAX) 17 g packet Take 17 g by mouth daily.     Propylene Glycol (SYSTANE COMPLETE OP) Apply 1 drop to eye daily as needed (dry eyes).     sennosides-docusate sodium (SENOKOT-S) 8.6-50 MG tablet Take 1-2 tablets by mouth daily as needed for constipation.     zinc oxide (BALMEX) 11.3 % CREA cream Apply 1 application topically 2 (two) times daily as needed.     No current facility-administered medications for this visit.   No results found.  Review of Systems:   A ROS was performed including pertinent positives and negatives as documented in the HPI.   Musculoskeletal Exam:    There were no vitals taken for this visit.  Right hip incisions well-healed.  Staples removed today.  She is able to extend and flex at the right knee.  Toes warm well perfused  Imaging:    X-rays 2 views right femur: Intact right cephalomedullary nail no evidence of complication, increased callus consistent with healing.  There is significant heterotopic ossification about the greater trochanteric  I personally reviewed and interpreted the radiographs.   Assessment:   88 year old female status post right hip cephalomedullary nail.  She is overall doing well she is advancing as tolerated.  She been able to walk with minimal pain on a walker.  We will see her back in 3 months for final check  Plan :    -Return to clinic 3 months    I  personally saw and evaluated the patient, and participated in the management and treatment plan.  Vanetta Mulders, MD Attending Physician, Orthopedic Surgery  This document was dictated using Dragon voice recognition software. A reasonable attempt at proof reading has been made to minimize errors.

## 2022-01-20 ENCOUNTER — Telehealth: Payer: Self-pay | Admitting: Cardiovascular Disease

## 2022-01-20 NOTE — Telephone Encounter (Signed)
?*  STAT* If patient is at the pharmacy, call can be transferred to refill team. ? ? ?1. Which medications need to be refilled? (please list name of each medication and dose if known) Spironolactone  ? ?2. Which pharmacy/location (including street and city if local pharmacy) is medication to be sent to? [place], [address] ? ?3. Do they need a 30 day or 90 day supply? 90 ? ? ?Per Judson Roch at [place]  ?

## 2022-01-20 NOTE — Telephone Encounter (Signed)
Talked to the patients son about Lasix 10 mg q 48 hours as needed. States he is trying to get the patient to take the medication q 48 hours, if needed. The patients legs are not swollen but her feet are a little. He fears if she doesn't take the medication for edema when she needs to that the fluid will not be controlled and she will end up needed more medication or a hospital visit. [name] asked that we call back around 2 pm when he will be with the patient to help educate her.  ?

## 2022-01-20 NOTE — Telephone Encounter (Signed)
Pt c/o medication issue: ? ?1. Name of Medication: furosemide (LASIX) 20 MG tablet ? ?2. How are you currently taking this medication (dosage and times per day)? N/A ? ?3. Are you having a reaction (difficulty breathing--STAT)? no ? ?4. What is your medication issue? Per Judson Roch at [facility], she said it sounded like by the way the patient and her son were bickering, the patient is not taking this medication like she is supposed to. She states that it sounds like she takes it whenever she feels like it because it does make her have to go to the bathroom too much.   ? ? ?Heather [name] (son) - call mobile number - [phone] ?

## 2022-01-20 NOTE — Telephone Encounter (Signed)
Tried to called back to [facility], to inform them that pt no longer takes spironolactone, medication D/C by provider in the hospital, but there was no answer and unable to leave voice mail. ?

## 2022-01-20 NOTE — Telephone Encounter (Signed)
Spoke with the patient's son who states that they think there was some confusion in regards to the patient's spironolactone. She found an empty bottle and wasn't sure if she was supposed to be on it or not. I advised him that we do not have that listed on her medication list.  ?She is taking Lasix 10 mg every other day. Since moving back home from rehab she has been hesitant to take it. The son is trying to keep her on top of it and make sure that she is taking it regularly to prevent any more fluid build up. He states that her swelling has greatly improved. Advised to continue medications as prescribed.  ?

## 2022-01-22 ENCOUNTER — Ambulatory Visit (INDEPENDENT_AMBULATORY_CARE_PROVIDER_SITE_OTHER): Payer: Medicare Other | Admitting: Family

## 2022-01-22 ENCOUNTER — Other Ambulatory Visit: Payer: Self-pay

## 2022-01-22 ENCOUNTER — Encounter: Payer: Self-pay | Admitting: Family

## 2022-01-22 VITALS — BP 98/60 | HR 94 | Temp 97.7°F | Resp 16 | Ht <= 58 in

## 2022-01-22 DIAGNOSIS — I5032 Chronic diastolic (congestive) heart failure: Secondary | ICD-10-CM | POA: Diagnosis not present

## 2022-01-22 DIAGNOSIS — L89622 Pressure ulcer of left heel, stage 2: Secondary | ICD-10-CM | POA: Diagnosis not present

## 2022-01-22 DIAGNOSIS — R600 Localized edema: Secondary | ICD-10-CM | POA: Diagnosis not present

## 2022-01-22 NOTE — Progress Notes (Signed)
Provider: Richarda Blade FNP-C  Ellyse Rotolo, Donalee Citrin, NP  Patient Care Team: Shemiah Rosch, Donalee Citrin, NP as PCP - General (Family Medicine) Wendall Stade, MD as PCP - Cardiology (Cardiology) Gibson Ramp, MD as Referring Physician (Internal Medicine) Valeria Batman, MD as Attending Physician (Orthopedic Surgery) Olivia Canter, MD as Consulting Physician (Ophthalmology) Michele Mcalpine, MD as Consulting Physician (Pulmonary Disease)  Extended Emergency Contact Information Primary Emergency Contact: Friberg,Michael Address: [address]          [place], [place] [address] [place] of [place] Home Phone: [phone] Mobile Phone: [phone] Relation: Son Secondary Emergency Contact: [facility] Phone: [phone] Relation: Other Preferred language: [place] Interpreter needed? No  Code Status:  Full Code  Goals of care: Advanced Directive information Advanced Directives 01/22/2022  Does Patient Have a Medical Advance Directive? No  Type of Advance Directive -  Does patient want to make changes to medical advance directive? -  Copy of Healthcare Power of Attorney in Chart? -  Would patient like information on creating a medical advance directive? No - Patient declined     Chief Complaint  Patient presents with   Acute Visit    Fractured hip after Christmas. Patient is unable to stand and walk currently.     HPI:  Pt is a 88 y.o. 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  Request Placard since unable to walk long distance.   Past Medical History:  Diagnosis Date   Abdominal pain, left  lower quadrant 06/22/2013   Abnormal CT scan of lung 07/16/2015   Allergic rhinitis, cause unspecified 02/21/2014   Anxiety state, unspecified    Breast cancer (HCC)    NO STICK OR BLOOD PRESSURE CHECKS IN LEFT ARM   Carpal tunnel syndrome    Carpal tunnel syndrome on both sides 10/03/2008   Qualifier: Diagnosis of  By: Kriste Basque MD, Lonzo Cloud    Cervical spondylosis    Chronic pain syndrome 03/03/2019   Colon polyp 12/19/2009   Transverse-polypoid colorectal mucosa   Constipation    Cough 10/21/2014   Degenerative joint disease    Diastolic dysfunction    Diverticula of colon    Diverticulosis of colon 05/24/2002   Qualifier: Diagnosis of  By: Thereasa Solo     Gastritis, chronic    Hiatal hernia    Hypertension    Low back pain    LPRD (laryngopharyngeal reflux disease) 04/04/2014   Osteoporosis 05/22/2020   RBBB (right bundle branch block with left anterior fascicular block)    Renal cyst    Venous insufficiency    Past Surgical History:  Procedure Laterality Date   CARPAL TUNNEL RELEASE     INTRAMEDULLARY (IM) NAIL INTERTROCHANTERIC Right 11/13/2021   Procedure: INTRAMEDULLARY (IM) NAIL INTERTROCHANTRIC;  Surgeon: Huel Cote, MD;  Location: [facility];  Service: Orthopedics;  Laterality: Right;   Left mastectomy      Allergies  Allergen Reactions   Fentanyl Nausea Only and Other (See Comments)    Pt states that she is not allergic     Naprosyn [Naproxen] Other (See Comments)    Patient felt tongue and face swelling. Went to ED where no  visible swelling noted    Outpatient Encounter Medications as of 01/22/2022  Medication Sig   acetaminophen (TYLENOL) 500 MG tablet Take 500 mg by mouth every 6 (six) hours as needed for moderate pain or headache.   carvedilol (COREG) 3.125 MG tablet Take 1 tablet (3.125 mg total) by mouth 2 (two) times daily. Hold for SBP <110 or HR <70   cholecalciferol (VITAMIN D) 1000 units tablet Take 1,000 Units by mouth daily.   enoxaparin (LOVENOX)  30 MG/0.3ML injection Inject 0.3 mLs (30 mg total) into the skin daily. Total 30 days for DVT prophylaxis, last dose 12/13/21.   famotidine (PEPCID) 20 MG tablet Take 1 tablet (20 mg total) by mouth daily.   feeding supplement, ENSURE ENLIVE, (ENSURE ENLIVE) LIQD Take 237 mLs by mouth 2 (two) times daily between meals.   furosemide (LASIX) 20 MG tablet Take 0.5 tablets (10 mg total) by mouth every other day as needed for fluid or edema. TAKE 1/2 (ONE-HALF) TABLET BY MOUTH EVERY other DAY   levocetirizine (XYZAL) 5 MG tablet Take 1 tablet (5 mg total) by mouth daily.   lidocaine (LIDODERM) 5 % Place 1 patch onto the skin as needed. Remove & Discard patch within 12 hours or as directed by MD   Multiple Vitamins-Minerals (MULTIVITAMINS THER. W/MINERALS) TABS Take 1 tablet by mouth daily.   polyethylene glycol (MIRALAX / GLYCOLAX) 17 g packet Take 17 g by mouth daily.   Propylene Glycol (SYSTANE COMPLETE OP) Apply 1 drop to eye daily as needed (dry eyes).   sennosides-docusate sodium (SENOKOT-S) 8.6-50 MG tablet Take 1-2 tablets by mouth daily as needed for constipation.   zinc oxide (BALMEX) 11.3 % CREA cream Apply 1 application topically 2 (two) times daily as needed.   No facility-administered encounter medications on file as of 01/22/2022.    Review of Systems  Constitutional:  Negative for appetite change, chills, fatigue, fever and unexpected weight change.  HENT:  Negative for congestion, ear discharge, ear pain, hearing loss, nosebleeds, postnasal drip, rhinorrhea, sinus pressure, sinus pain, sneezing, sore throat and tinnitus.   Eyes:  Negative for pain, discharge, redness, itching and visual disturbance.  Respiratory:  Negative for cough, chest tightness, shortness of breath and wheezing.   Cardiovascular:  Negative for chest pain, palpitations and leg swelling.  Gastrointestinal:  Negative for abdominal distention, abdominal pain, constipation, diarrhea, nausea and vomiting.  Genitourinary:   Negative for difficulty urinating, dysuria, flank pain, frequency and urgency.  Musculoskeletal:  Positive for arthralgias and gait problem. Negative for back pain, joint swelling, myalgias, neck pain and neck stiffness.  Skin:  Negative for color change, pallor and rash.  Neurological:  Negative for dizziness, syncope, speech difficulty, weakness, light-headedness, numbness and headaches.  Hematological:  Does not bruise/bleed easily.  Psychiatric/Behavioral:  Negative for agitation, behavioral problems, confusion, hallucinations and sleep disturbance. The patient is not nervous/anxious.        Memory loss    Immunization History  Administered Date(s) Administered   Fluad Quad(high Dose 65+) 08/19/2021   Influenza, High Dose Seasonal PF 08/21/2015   Influenza,inj,Quad PF,6+ Mos 09/18/2015   PFIZER(Purple Top)SARS-COV-2 Vaccination 04/18/2020, 05/10/2020   Pneumococcal Polysaccharide-23 02/07/2015   Pertinent  Health Maintenance Due  Topic Date Due   INFLUENZA VACCINE  Completed   DEXA SCAN  Completed   Fall Risk 11/24/2021 11/24/2021 11/25/2021 11/25/2021 01/22/2022  Falls in the past year? - - - - 1  Was there an injury with Fall? - - - - 1  Fall  Risk Category Calculator - - - - 2  Fall Risk Category - - - - Moderate  Patient Fall Risk Level High fall risk High fall risk High fall risk High fall risk Moderate fall risk  Patient at Risk for Falls Due to - - - - History of fall(s)  Fall risk Follow up - - - - Falls evaluation completed;Education provided;Falls prevention discussed   Functional Status Survey:    Vitals:   01/22/22 1307  BP: 98/60  Pulse: 94  Resp: 16  Temp: 97.7 F (36.5 C)  SpO2: 97%  Height: $Remove'4\' 10"'DtWrCgM$  (1.473 m)   Body mass index is 22.99 kg/m. Physical Exam Vitals reviewed.  Constitutional:      General: She is not in acute distress.    Appearance: Normal appearance. She is normal weight. She is not ill-appearing or diaphoretic.  HENT:     Head: Normocephalic.   Eyes:     General: No scleral icterus.       Right eye: No discharge.        Left eye: No discharge.     Conjunctiva/sclera: Conjunctivae normal.     Pupils: Pupils are equal, round, and reactive to light.  Neck:     Vascular: No carotid bruit.  Cardiovascular:     Rate and Rhythm: Normal rate and regular rhythm.     Pulses: Normal pulses.     Heart sounds: Normal heart sounds. No murmur heard.   No friction rub. No gallop.  Pulmonary:     Effort: Pulmonary effort is normal. No respiratory distress.     Breath sounds: Normal breath sounds. No wheezing, rhonchi or rales.  Chest:     Chest wall: No tenderness.  Abdominal:     General: Bowel sounds are normal. There is no distension.     Palpations: Abdomen is soft. There is no mass.     Tenderness: There is no abdominal tenderness. There is no right CVA tenderness, left CVA tenderness, guarding or rebound.  Musculoskeletal:        General: No swelling or tenderness. Normal range of motion.     Cervical back: Normal range of motion. No rigidity or tenderness.     Right lower leg: No edema.     Left lower leg: No edema.  Lymphadenopathy:     Cervical: No cervical adenopathy.  Skin:    General: Skin is warm and dry.     Coloration: Skin is not pale.     Findings: No bruising, erythema, lesion or rash.     Comments: Left heel stage 2 ulcer wound bed pink with slight tenderness on achilles.small amount of drainage noted.   Neurological:     Mental Status: She is alert and oriented to person, place, and time.     Cranial Nerves: No cranial nerve deficit.     Sensory: No sensory deficit.     Motor: No weakness.     Coordination: Coordination normal.     Gait: Gait abnormal.  Psychiatric:        Mood and Affect: Mood normal.        Speech: Speech normal.        Behavior: Behavior normal.        Thought Content: Thought content normal.        Judgment: Judgment normal.    Labs reviewed: Recent Labs    11/14/21 0359  11/16/21 0303 11/20/21 0810  NA 144 142 137  K 3.8 4.1 4.5  CL 107  108 105  CO2 $Re'27 30 27  'UJa$ GLUCOSE 132* 110* 107*  BUN 25* 22 27*  CREATININE 0.83 0.72 0.73  CALCIUM 8.2* 8.0* 8.4*  MG 2.4  --  2.1  PHOS  --   --  3.2   Recent Labs    11/11/21 2115 11/12/21 0408  AST 48* 54*  ALT 28 28  ALKPHOS 40 41  BILITOT 1.3* 1.3*  PROT 6.7 6.7  ALBUMIN 3.7 3.4*   Recent Labs    06/10/21 1128 11/11/21 2115 11/12/21 0408 11/14/21 0359 11/16/21 0303 11/20/21 0810  WBC 4.5 9.9   < > 8.5 7.3 6.5  NEUTROABS 2.6 7.4  --   --   --   --   HGB 11.9* 12.7   < > 9.4* 8.9* 9.5*  HCT 37.7 40.0   < > 30.3* 27.4* 29.8*  MCV 98.7 97.3   < > 99.0 95.8 96.4  PLT 174 185   < > 124* 166 376   < > = values in this interval not displayed.   Lab Results  Component Value Date   TSH 3.14 10/09/2020   No results found for: HGBA1C Lab Results  Component Value Date   CHOL 199 02/02/2014   HDL 98.70 02/02/2014   LDLCALC 96 02/02/2014   TRIG 20.0 02/02/2014   CHOLHDL 2 02/02/2014    Significant Diagnostic Results in last 30 days:  DG FEMUR, MIN 2 VIEWS RIGHT  Result Date: 01/17/2022 CLINICAL DATA:  Postoperative, intramedullary nailing. EXAM: RIGHT FEMUR 2 VIEWS COMPARISON:  Pelvis and right hip radiographs 12/04/2021 FINDINGS: There is diffuse decreased bone mineralization. Interval removal of lateral right hip surgical skin staples. Postsurgical changes are again seen of cephalomedullary nail fixation of prior intertrochanteric fracture. Unchanged near anatomic alignment. There is again mild medial displacement of lesser trochanter. Mild right femoroacetabular joint space narrowing with moderate superolateral degenerative osteophytosis. Moderate heterotopic bone formation superior to the greater trochanter. Severe pubic symphysis joint space narrowing. Moderate inferior right sacroiliac joint space narrowing. Severe medial compartment and moderate lateral compartment of the knee osteoarthritis.  IMPRESSION: Status post ORIF of proximal right femoral fracture with unchanged near anatomic alignment. Electronically Signed   By: Yvonne Kendall M.D.   On: 01/17/2022 21:19    Assessment/Plan 1. Pressure injury of left heel, stage 2 (HCC) Left heel wound bed pink with slight tenderness on achilles HHN to continue to change dressing every other day and son to change PRN . - CBC with Differential/Platelet  2. Bilateral lower extremity edema Continue on furosemide  - BMP with eGFR([facility])  3. Chronic diastolic congestive heart failure (HCC) No signs of fluid overload  Continue current medication - CBC with Differential/Platelet - BMP with eGFR([facility])  Family/ staff Communication: Reviewed plan of care with patient and son verbalized understanding   Labs/tests ordered:  - CBC with Differential/Platelet - BMP with eGFR([facility])  Next Appointment: As needed if symptoms worsen or fail to improve    Sandrea Hughs, NP

## 2022-01-22 NOTE — Patient Instructions (Signed)
-   continue current left heel ulcer dressing.Notify provider for any signs of infection.  ?- Drink Ensure daily  ?-  ?

## 2022-01-23 LAB — CBC WITH DIFFERENTIAL/PLATELET
Absolute Monocytes: 769 cells/uL (ref 200–950)
Basophils Absolute: 50 cells/uL (ref 0–200)
Basophils Relative: 0.8 %
Eosinophils Absolute: 82 cells/uL (ref 15–500)
Eosinophils Relative: 1.3 %
HCT: 35.9 % (ref 35.0–45.0)
Hemoglobin: 11.5 g/dL — ABNORMAL LOW (ref 11.7–15.5)
Lymphs Abs: 1751 cells/uL (ref 850–3900)
MCH: 29.6 pg (ref 27.0–33.0)
MCHC: 32 g/dL (ref 32.0–36.0)
MCV: 92.5 fL (ref 80.0–100.0)
MPV: 9.2 fL (ref 7.5–12.5)
Monocytes Relative: 12.2 %
Neutro Abs: 3648 cells/uL (ref 1500–7800)
Neutrophils Relative %: 57.9 %
Platelets: 331 10*3/uL (ref 140–400)
RBC: 3.88 10*6/uL (ref 3.80–5.10)
RDW: 13.1 % (ref 11.0–15.0)
Total Lymphocyte: 27.8 %
WBC: 6.3 10*3/uL (ref 3.8–10.8)

## 2022-01-23 LAB — BASIC METABOLIC PANEL WITH GFR
BUN/Creatinine Ratio: 35 (calc) — ABNORMAL HIGH (ref 6–22)
BUN: 30 mg/dL — ABNORMAL HIGH (ref 7–25)
CO2: 29 mmol/L (ref 20–32)
Calcium: 9.3 mg/dL (ref 8.6–10.4)
Chloride: 104 mmol/L (ref 98–110)
Creat: 0.85 mg/dL (ref 0.60–0.95)
Glucose, Bld: 89 mg/dL (ref 65–139)
Potassium: 4.9 mmol/L (ref 3.5–5.3)
Sodium: 141 mmol/L (ref 135–146)
eGFR: 61 mL/min/{1.73_m2} (ref >=60)

## 2022-02-05 ENCOUNTER — Telehealth: Payer: Self-pay

## 2022-02-05 DIAGNOSIS — R8290 Unspecified abnormal findings in urine: Secondary | ICD-10-CM | POA: Diagnosis not present

## 2022-02-05 NOTE — Telephone Encounter (Signed)
Gave verbal orders to collect urine specimen for U/A and C/S.  ?

## 2022-02-05 NOTE — Telephone Encounter (Signed)
[name] called requesting an order for a urinalysis and culture for sensitivity. Heather [name] stated that the patient has an increase in confusion, frustration, and urgency and believes it may be a UTI but wants to tests it.   ? ?Please Advise. Message routed to [place], Nelda Bucks, NP ?   ?

## 2022-02-05 NOTE — Telephone Encounter (Signed)
Okay to verbal orders for [facility] to collect urine specimen for U/A and C/S  ?

## 2022-02-06 NOTE — Progress Notes (Incomplete)
?Cardiology Office Note:   ? ?Date:  02/06/2022  ? ?ID:  Heather [name], DOB May 31, 1918, MRN [id number] ? ?PCP:  Ngetich, Nelda Bucks, NP  ?Cardiologist:  Jenkins Rouge, MD   ?Chief Complaint: Diastolic CHF/Edema ?  ? ?Patient Profile:   ? ?Heather [name] is a 88 y.o. female with:  ?Diastolic CHF ?Hypertension  ?Chest pain  ?Myoview in 2007, 2009 normal  ?Coronary Ca by CT in 2014 ?Breast CA ?S/p L mastectomy  ?Chronic cough  ?Lower ext edema  ?Anxiety/Depression  ?Diverticulosis  ?Dementia  ? ?Prior CV studies: ?ABIs 03/31/16 ?Normal, bilaterally  ? ?Echocardiogram 11/08/09 ?EF 55-60, mild AI, MAC  ? ?History of Present Illness:   ? ?88 y.o. I have not seen in 3 years   She returns for follow up.  She has not had any chest pain, syncope, orthopnea.  She has chronic lower extremity swelling which is fairly stable.  She sometimes wears compression hose.  She does note dyspnea with some activities but overall, this is stable. She takes lasix 20 mg daily Duplex 07/31/20 no bilateral DVTls She has not had an echo recently but historically with normal EF  ? ?[place] and fractured hip after Christmas unable to stand/walk Did have I.M  nail of right femur 11/13/21 She has a pressure injury to right heal with dressing No signs of volume overload BUN 30 Cr 0.85 K 4.9 01/22/22  ? ?Prolonged hospitalization 12/26-1/10/23 due to fracture enephalopathy and COVID positive ? ?*** ? ?Past Medical History:  ?Diagnosis Date  ?? Abdominal pain, left lower quadrant 06/22/2013  ?? Abnormal CT scan of lung 07/16/2015  ?? Allergic rhinitis, cause unspecified 02/21/2014  ?? Anxiety state, unspecified   ?? Breast cancer ([place])   ? NO STICK OR BLOOD PRESSURE CHECKS IN LEFT ARM  ?? Carpal tunnel syndrome   ?? Carpal tunnel syndrome on both sides 10/03/2008  ? Qualifier: Diagnosis of  By: Lenna Gilford MD, Deborra Medina   ?? Cervical spondylosis   ?? Chronic pain syndrome 03/03/2019  ?? Colon polyp 12/19/2009  ? Transverse-polypoid colorectal mucosa  ?? Constipation    ?? Cough 10/21/2014  ?? Degenerative joint disease   ?? Diastolic dysfunction   ?? Diverticula of colon   ?? Diverticulosis of colon 05/24/2002  ? Qualifier: Diagnosis of  By: Jerral Ralph    ?? Gastritis, chronic   ?? Hiatal hernia   ?? Hypertension   ?? Low back pain   ?? LPRD (laryngopharyngeal reflux disease) 04/04/2014  ?? Osteoporosis 05/22/2020  ?? RBBB (right bundle branch block with left anterior fascicular block)   ?? Renal cyst   ?? Venous insufficiency   ? ? ?Current Medications: ?No outpatient medications have been marked as taking for the 02/07/22 encounter (Appointment) with Josue Hector, MD.  ?  ? ?Allergies:   Fentanyl and Naprosyn [naproxen]  ? ?Social History  ? ?Tobacco Use  ?? Smoking status: Never  ?? Smokeless tobacco: Never  ?Vaping Use  ?? Vaping Use: Never used  ?Substance Use Topics  ?? Alcohol use: No  ?  Alcohol/week: 0.0 standard drinks  ?? Drug use: No  ?  ? ?Family Hx: ?The patient's family history is negative for Colon cancer, Pancreatic cancer, Esophageal cancer, and Stomach cancer. ? ?ROS  ? ?EKGs/Labs/Other Test Reviewed:   ? ?EKG:  EKG is   ordered today.  The ekg ordered today demonstrates normal sinus rhythm, heart rate 87, left axis deviation, right bundle branch block, QTC 481, no change from  prior tracing ? ?Recent Labs: ?11/12/2021: ALT 28 ?11/20/2021: Magnesium 2.1 ?01/22/2022: BUN 30; Creat 0.85; Hemoglobin 11.5; Platelets 331; Potassium 4.9; Sodium 141  ? ?Recent Lipid Panel ?Lab Results  ?Component Value Date/Time  ? CHOL 199 02/02/2014 10:55 AM  ? TRIG 20.0 02/02/2014 10:55 AM  ? HDL 98.70 02/02/2014 10:55 AM  ? CHOLHDL 2 02/02/2014 10:55 AM  ? LDLCALC 96 02/02/2014 10:55 AM  ? ? ?Physical Exam:   ? ?VS:  There were no vitals taken for this visit.   ? ?Wt Readings from Last 3 Encounters:  ?11/11/21 110 lb (49.9 kg)  ?08/19/21 115 lb 9.6 oz (52.4 kg)  ?06/14/21 96 lb 6.4 oz (43.7 kg)  ?  ? ?Elderly black female ?Abnormal gait ?Post Right femur fracture ?Lungs  clear ?Plus one bilateral edema ?Abdomen benign  ?Soft SEM benign AV sclerosis  ?Pressure ulcer right heel improving  ? ?   ? ?ASSESSMENT & PLAN:   ? ?1. Chronic diastolic congestive heart failure ([place]) ?Volume stable Lytes ok continue lasix Given age no need to repeat echo  ? ?2. Femur Fracture :  post right I.M nail Age and fracture limiting ambulation ? ?3. Ulcer:  right heal dressing f/u wound center and [employer] care  ? ?4. HTN:  stable on coreg  ?  ? ?Dispo:  F/U Cardiology PRN  ? ?Medication Adjustments/Labs and Tests Ordered: ?Current medicines are reviewed at length with the patient today.  Concerns regarding medicines are outlined above.  ?Tests Ordered: ?No orders of the defined types were placed in this encounter. ? ?Medication Changes: ?No orders of the defined types were placed in this encounter. ? ? ?Signed, ?Jenkins Rouge, MD  ?02/06/2022 2:23 PM    ?[facility] ?[address], [place], [place]  [address] ?Phone: [phone]; Fax: [phone]  ? ? ?

## 2022-02-07 ENCOUNTER — Ambulatory Visit: Payer: Medicare Other | Admitting: Cardiovascular Disease

## 2022-02-07 ENCOUNTER — Telehealth: Payer: Self-pay | Admitting: *Deleted

## 2022-02-07 NOTE — Telephone Encounter (Signed)
Spoke with pt's son and Pt does not need to keep appt today per Dr Johnsie Cancel  Pt has edema that is venous related to lower extremities no Cardiac issues at this time Pt's son had question re if mom may  drink Pedialyte and this was discussed with Dr Johnsie Cancel and pt may drink this daily Pt's son aware ./cy ?

## 2022-02-11 DIAGNOSIS — Z8616 Personal history of COVID-19: Secondary | ICD-10-CM | POA: Diagnosis not present

## 2022-02-11 DIAGNOSIS — S72001D Fracture of unspecified part of neck of right femur, subsequent encounter for closed fracture with routine healing: Secondary | ICD-10-CM | POA: Diagnosis not present

## 2022-02-11 DIAGNOSIS — Z4800 Encounter for change or removal of nonsurgical wound dressing: Secondary | ICD-10-CM | POA: Diagnosis not present

## 2022-02-11 DIAGNOSIS — Z79899 Other long term (current) drug therapy: Secondary | ICD-10-CM | POA: Diagnosis not present

## 2022-02-11 DIAGNOSIS — I872 Venous insufficiency (chronic) (peripheral): Secondary | ICD-10-CM | POA: Diagnosis not present

## 2022-02-11 DIAGNOSIS — W19XXXD Unspecified fall, subsequent encounter: Secondary | ICD-10-CM | POA: Diagnosis not present

## 2022-02-11 DIAGNOSIS — K5900 Constipation, unspecified: Secondary | ICD-10-CM | POA: Diagnosis not present

## 2022-02-11 DIAGNOSIS — G894 Chronic pain syndrome: Secondary | ICD-10-CM | POA: Diagnosis not present

## 2022-02-11 DIAGNOSIS — Z9181 History of falling: Secondary | ICD-10-CM | POA: Diagnosis not present

## 2022-02-11 DIAGNOSIS — M1990 Unspecified osteoarthritis, unspecified site: Secondary | ICD-10-CM | POA: Diagnosis not present

## 2022-02-11 DIAGNOSIS — F419 Anxiety disorder, unspecified: Secondary | ICD-10-CM | POA: Diagnosis not present

## 2022-02-11 DIAGNOSIS — L89623 Pressure ulcer of left heel, stage 3: Secondary | ICD-10-CM | POA: Diagnosis not present

## 2022-02-11 DIAGNOSIS — I110 Hypertensive heart disease with heart failure: Secondary | ICD-10-CM | POA: Diagnosis not present

## 2022-02-11 DIAGNOSIS — M80051D Age-related osteoporosis with current pathological fracture, right femur, subsequent encounter for fracture with routine healing: Secondary | ICD-10-CM | POA: Diagnosis not present

## 2022-02-11 DIAGNOSIS — I5032 Chronic diastolic (congestive) heart failure: Secondary | ICD-10-CM | POA: Diagnosis not present

## 2022-02-11 DIAGNOSIS — M47812 Spondylosis without myelopathy or radiculopathy, cervical region: Secondary | ICD-10-CM | POA: Diagnosis not present

## 2022-02-16 DIAGNOSIS — S72001D Fracture of unspecified part of neck of right femur, subsequent encounter for closed fracture with routine healing: Secondary | ICD-10-CM | POA: Diagnosis not present

## 2022-02-16 DIAGNOSIS — M6281 Muscle weakness (generalized): Secondary | ICD-10-CM | POA: Diagnosis not present

## 2022-02-21 ENCOUNTER — Telehealth: Payer: Self-pay | Admitting: Cardiovascular Disease

## 2022-02-21 NOTE — Telephone Encounter (Signed)
? ?  Pt c/o swelling: STAT is pt has developed SOB within 24 hours ? ?If swelling, where is the swelling located? legs ? ?How much weight have you gained and in what time span?  ? ?Have you gained 3 pounds in a day or 5 pounds in a week?  ? ?Do you have a log of your daily weights (if so, list)?  ? ?Are you currently taking a fluid pill? No  ? ?Are you currently SOB? None  ? ?Have you traveled recently? No  ? ?Clarise Cruz RN with [facility] calling, she visit pt today and she wanted to report that pt has an increased edema, no symptoms but pt is not taking her fluid pills. Per RN the pt said she is not taking it because she doesn't want to go to the bathroom very much. this only an FYI, but if there's any question to call her but  ?

## 2022-02-24 ENCOUNTER — Telehealth: Payer: Self-pay | Admitting: Family

## 2022-02-24 DIAGNOSIS — I110 Hypertensive heart disease with heart failure: Secondary | ICD-10-CM

## 2022-02-24 DIAGNOSIS — R5381 Other malaise: Secondary | ICD-10-CM

## 2022-02-24 DIAGNOSIS — I5032 Chronic diastolic (congestive) heart failure: Secondary | ICD-10-CM

## 2022-02-24 DIAGNOSIS — F02818 Dementia in other diseases classified elsewhere, unspecified severity, with other behavioral disturbance: Secondary | ICD-10-CM

## 2022-02-24 DIAGNOSIS — G8929 Other chronic pain: Secondary | ICD-10-CM

## 2022-02-24 NOTE — Telephone Encounter (Signed)
Hospice referral ordered as requested  ?

## 2022-02-24 NOTE — Telephone Encounter (Signed)
Pt family called asking for updated hospice referral bc pt is ready to be evaluated for hospice again ? ?Thanks, Lattie Haw ?

## 2022-02-25 ENCOUNTER — Telehealth: Payer: Self-pay | Admitting: Family

## 2022-02-25 MED ORDER — SACCHAROMYCES BOULARDII 250 MG PO CAPS: 250.0000 mg | ORAL_CAPSULE | Freq: Two times a day (BID) | ORAL | 0 refills | Status: DC

## 2022-02-25 MED ORDER — CEPHALEXIN 500 MG PO CAPS: 500.0000 mg | ORAL_CAPSULE | Freq: Two times a day (BID) | ORAL | 0 refills | Status: DC

## 2022-02-25 NOTE — Telephone Encounter (Signed)
Patient son Notified and agreed. Medication sent to pharmacy.  ?

## 2022-02-25 NOTE — Telephone Encounter (Signed)
Final urine culture received from [facility]  indicates > 100,000 colonies Viridans bacteria.start on Keflex 500 mg tablet one by mouth twice daily x 7 days for urinary tract infection.  ?- Take along with Probiotics Florstor 250 mg capsule one by mouth twice daily x 10 days to prevent antibiotics associated diarrhea.  ?

## 2022-02-26 ENCOUNTER — Telehealth: Payer: Self-pay

## 2022-02-26 NOTE — Telephone Encounter (Signed)
Okay to give HHN verbal orders.  

## 2022-02-26 NOTE — Telephone Encounter (Signed)
Message left on clinical intake voicemail:  ? ?Patient with decrease in wound status on left heel. Dressing is always saturated which makes the skin macerated. Patient is using heels to navigate wheelchair in the home and is not elevating legs as instructed. ? ?Clarise Cruz with [facility] would like new wound care orders to do skin prep, medihoney, and foam dressing 2 times weekly and sone will do once weekly for a total of 3 dressing changes per week.  ? ?Call returned to [place] and verbal orders given as requested. ? ?I will forward a message to Ngetich, Dinah C, NP as a FYI.  ? ? ?

## 2022-02-26 NOTE — Telephone Encounter (Signed)
Orders were given ?

## 2022-03-03 ENCOUNTER — Telehealth: Payer: Self-pay | Admitting: *Deleted

## 2022-03-03 NOTE — Telephone Encounter (Signed)
Sarah with [facility] called with Concerns regarding patient's Left Heel Wound: ? ?Stated the wound has declined. Soft black escar is covering 50% of wound base, still having a great amount of maceration around the wound edges. Increased bilateral lower extremity edema.  ? ?Requesting wound Verbal Orders for: ? ?Continue with MediHoney and cover with guaze and apply unna boot compression system and Coban to both lower extremities to help with the edema.  ? ?Please Advise.  ?

## 2022-03-03 NOTE — Telephone Encounter (Signed)
Okay to give wound care verbal orders.Also recommend referral to wound Center for evaluation wound.  ?

## 2022-03-03 NOTE — Telephone Encounter (Signed)
Noted  

## 2022-03-03 NOTE — Telephone Encounter (Signed)
Sarah with [facility] notified and agreed. Stated that she wasn't sure that the family would be able to get patient out for appointment to the wound care center.  ?Stated that she will reevaluate after the Unna boot and let us know.  ?

## 2022-03-04 DIAGNOSIS — I872 Venous insufficiency (chronic) (peripheral): Secondary | ICD-10-CM | POA: Diagnosis not present

## 2022-03-04 DIAGNOSIS — L89623 Pressure ulcer of left heel, stage 3: Secondary | ICD-10-CM | POA: Diagnosis not present

## 2022-03-04 DIAGNOSIS — I110 Hypertensive heart disease with heart failure: Secondary | ICD-10-CM | POA: Diagnosis not present

## 2022-03-04 DIAGNOSIS — S72001D Fracture of unspecified part of neck of right femur, subsequent encounter for closed fracture with routine healing: Secondary | ICD-10-CM | POA: Diagnosis not present

## 2022-03-10 ENCOUNTER — Telehealth: Payer: Self-pay

## 2022-03-10 NOTE — Telephone Encounter (Signed)
[name] home health nurse called and would like a verbal order to extend patients home wound care. [name] contact information is [phone]. ?

## 2022-03-10 NOTE — Telephone Encounter (Signed)
May give Home health orders per [facility] policy.  ?

## 2022-03-11 NOTE — Telephone Encounter (Signed)
[name] with [facility] Notified and agreed.  ?

## 2022-03-12 ENCOUNTER — Telehealth: Payer: Self-pay

## 2022-03-12 ENCOUNTER — Ambulatory Visit (INDEPENDENT_AMBULATORY_CARE_PROVIDER_SITE_OTHER): Payer: Medicare Other | Admitting: Family

## 2022-03-12 DIAGNOSIS — R350 Frequency of micturition: Secondary | ICD-10-CM

## 2022-03-12 NOTE — Patient Instructions (Signed)
Notify provider if symptoms worsen or running any fever  ?

## 2022-03-12 NOTE — Telephone Encounter (Signed)
Message left on clinical intake voicemail:  ?Physical therapist with [facility] called requesting verbal orders for  ? ?PT: 1 x week for 4 more weeks ? ? ?Call was returned to [place]:  ?Per [facility] standing order, verbal order given. Message will be sent to patient's provider as a FYI.  ? ?

## 2022-03-12 NOTE — Telephone Encounter (Signed)
Noted  

## 2022-03-12 NOTE — Progress Notes (Signed)
This service is provided via telemedicine ? ?No vital signs collected/recorded due to the encounter was a telemedicine visit.  ? ?Location of patient (ex: home, work):  Home ? ?Patient consents to a telephone visit:  Yes, see encounter dated 08/02/2022 ? ?Location of the provider (ex: office, home):  [facility] ? ?Name of any referring provider:  N/A ? ?Names of all persons participating in the telemedicine service and their role in the encounter:  Marlowe Sax, NP, Carroll Kinds, CMA and patient. ? ?Time spent on call:  15 minutes with medical assistant. ? ? ? ?Provider: Marlowe Sax FNP-C ? ?Amoni Morales, Nelda Bucks, NP ? ?Patient Care Team: ?Zahriyah Joo, Nelda Bucks, NP as PCP - General (Family Medicine) ?Josue Hector, MD as PCP - Cardiology (Cardiology) ?Peggye Form, MD as Referring Physician (Internal Medicine) ?Garald Balding, MD as Attending Physician (Orthopedic Surgery) ?Debbra Riding, MD as Consulting Physician (Ophthalmology) ?Noralee Space, MD as Consulting Physician (Pulmonary Disease) ? ?Extended Emergency Contact Information ?Primary Emergency Contact: Colebank,Michael ?Address: [address] ?         [place], [place] [address] [place] ?Home Phone: [phone] ?Mobile Phone: [phone] ?Relation: Son ?Secondary Emergency Contact: Chrissie Noa ?Mobile Phone: [phone] ?Relation: Other ?Preferred language: English ?Interpreter needed? No ? ?Code Status:  Full Code  ?Goals of care: Advanced Directive information ? ?  01/22/2022  ?  1:08 PM  ?Advanced Directives  ?Does Patient Have a Medical Advance Directive? No  ?Would patient like information on creating a medical advance directive? No - Patient declined  ? ? ? ?Chief Complaint  ?Patient presents with  ? Acute Visit  ?  Patient states that she doesn't feel well. Patient states that she is hurting all over. Patient states that she eats a lot like she isn't getting full Patient has been having problem for a  while. Stomach hurts. Patient also complains of shoulder pain.  ? ? ?HPI:  ?Pt is a 88 y.o. female seen today for an acute visit for evaluation of not feeling well.Has been urinating more than usual states incontinent pads are usually soaked.Urine feels hot.tends to get cold but no fever.Abdomen also hurts.she denies denies any fever,chills,nausea,vomiting,abdominal pain,flank pain,urgency,dysuria or difficult urination. ? ?Appetite is good. ? ? ?Past Medical History:  ?Diagnosis Date  ? Abdominal pain, left lower quadrant 06/22/2013  ? Abnormal CT scan of lung 07/16/2015  ? Allergic rhinitis, cause unspecified 02/21/2014  ? Anxiety state, unspecified   ? Breast cancer ([place])   ? NO STICK OR BLOOD PRESSURE CHECKS IN LEFT ARM  ? Carpal tunnel syndrome   ? Carpal tunnel syndrome on both sides 10/03/2008  ? Qualifier: Diagnosis of  By: Lenna Gilford MD, Deborra Medina   ? Cervical spondylosis   ? Chronic pain syndrome 03/03/2019  ? Colon polyp 12/19/2009  ? Transverse-polypoid colorectal mucosa  ? Constipation   ? Cough 10/21/2014  ? Degenerative joint disease   ? Diastolic dysfunction   ? Diverticula of colon   ? Diverticulosis of colon 05/24/2002  ? Qualifier: Diagnosis of  By: Jerral Ralph    ? Gastritis, chronic   ? Hiatal hernia   ? Hypertension   ? Low back pain   ? LPRD (laryngopharyngeal reflux disease) 04/04/2014  ? Osteoporosis 05/22/2020  ? RBBB (right bundle branch block with left anterior fascicular block)   ? Renal cyst   ? Venous insufficiency   ? ?Past Surgical History:  ?Procedure Laterality Date  ? CARPAL TUNNEL  RELEASE    ? INTRAMEDULLARY (IM) NAIL INTERTROCHANTERIC Right 11/13/2021  ? Procedure: INTRAMEDULLARY (IM) NAIL INTERTROCHANTRIC;  Surgeon: Vanetta Mulders, MD;  Location: [facility];  Service: Orthopedics;  Laterality: Right;  ? Left mastectomy    ? ? ?Allergies  ?Allergen Reactions  ? Fentanyl Nausea Only and Other (See Comments)  ?  Pt states that she is not allergic    ? Naprosyn [Naproxen] Other (See Comments)   ?  Patient felt tongue and face swelling. Went to ED where no visible swelling noted  ? ? ?Outpatient Encounter Medications as of 03/12/2022  ?Medication Sig  ? acetaminophen (TYLENOL) 500 MG tablet Take 500 mg by mouth every 6 (six) hours as needed for moderate pain or headache.  ? carvedilol (COREG) 3.125 MG tablet Take 1 tablet (3.125 mg total) by mouth 2 (two) times daily. Hold for SBP <110 or HR <70  ? cholecalciferol (VITAMIN D) 1000 units tablet Take 1,000 Units by mouth daily.  ? enoxaparin (LOVENOX) 30 MG/0.3ML injection Inject 0.3 mLs (30 mg total) into the skin daily. Total 30 days for DVT prophylaxis, last dose 12/13/21.  ? famotidine (PEPCID) 20 MG tablet Take 1 tablet (20 mg total) by mouth daily.  ? feeding supplement, ENSURE ENLIVE, (ENSURE ENLIVE) LIQD Take 237 mLs by mouth 2 (two) times daily between meals.  ? furosemide (LASIX) 20 MG tablet Take 0.5 tablets (10 mg total) by mouth every other day as needed for fluid or edema. TAKE 1/2 (ONE-HALF) TABLET BY MOUTH EVERY other DAY  ? levocetirizine (XYZAL) 5 MG tablet Take 1 tablet (5 mg total) by mouth daily.  ? lidocaine (LIDODERM) 5 % Place 1 patch onto the skin as needed. Remove & Discard patch within 12 hours or as directed by MD  ? Multiple Vitamins-Minerals (MULTIVITAMINS THER. W/MINERALS) TABS Take 1 tablet by mouth daily.  ? polyethylene glycol (MIRALAX / GLYCOLAX) 17 g packet Take 17 g by mouth daily.  ? Propylene Glycol (SYSTANE COMPLETE OP) Apply 1 drop to eye daily as needed (dry eyes).  ? saccharomyces boulardii (FLORASTOR) 250 MG capsule Take 1 capsule (250 mg total) by mouth 2 (two) times daily. X 10 days  ? sennosides-docusate sodium (SENOKOT-S) 8.6-50 MG tablet Take 1-2 tablets by mouth daily as needed for constipation.  ? zinc oxide (BALMEX) 11.3 % CREA cream Apply 1 application topically 2 (two) times daily as needed.  ? [DISCONTINUED] cephALEXin (KEFLEX) 500 MG capsule Take 1 capsule (500 mg total) by mouth 2 (two) times daily. X 7  days  ? ?No facility-administered encounter medications on file as of 03/12/2022.  ? ? ?Review of Systems  ?Constitutional:  Negative for appetite change, chills, fatigue and fever.  ?Respiratory:  Negative for cough, chest tightness, shortness of breath and wheezing.   ?Cardiovascular:  Negative for chest pain, palpitations and leg swelling.  ?Gastrointestinal:  Negative for abdominal distention, constipation, diarrhea, nausea and vomiting.  ?     Report abdominal pain   ?Genitourinary:  Positive for frequency. Negative for difficulty urinating, dysuria, flank pain, hematuria and urgency.  ?Neurological:  Negative for dizziness, speech difficulty, light-headedness and headaches.  ?Psychiatric/Behavioral:  Negative for agitation, behavioral problems, confusion and sleep disturbance.   ? ?Immunization History  ?Administered Date(s) Administered  ? Fluad Quad(high Dose 65+) 08/19/2021  ? Influenza, High Dose Seasonal PF 08/21/2015  ? Influenza,inj,Quad PF,6+ Mos 09/18/2015  ? PFIZER(Purple Top)SARS-COV-2 Vaccination 04/18/2020, 05/10/2020  ? Pneumococcal Polysaccharide-23 02/07/2015  ? ?Pertinent  Health Maintenance Due  ?Topic  Date Due  ? INFLUENZA VACCINE  06/17/2022  ? DEXA SCAN  Completed  ? ? ?  11/24/2021  ?  8:00 AM 11/24/2021  ? 11:00 PM 11/25/2021  ? 10:30 AM 11/25/2021  ?  9:35 PM 01/22/2022  ?  1:08 PM  ?Fall Risk  ?Falls in the past year?     1  ?Was there an injury with Fall?     1  ?Fall Risk Category Calculator     2  ?Fall Risk Category     Moderate  ?Patient Fall Risk Level High fall risk High fall risk High fall risk High fall risk Moderate fall risk  ?Patient at Risk for Falls Due to     History of fall(s)  ?Fall risk Follow up     Falls evaluation completed;Education provided;Falls prevention discussed  ? ?Functional Status Survey: ?  ? ?There were no vitals filed for this visit. ?There is no height or weight on file to calculate BMI. ?Physical Exam ?Unable to complete on Telephone visit.  ? ?Labs  reviewed: ?Recent Labs  ?  11/14/21 ?1017 11/16/21 ?0303 11/20/21 ?5102 01/22/22 ?1400  ?NA 144 142 137 141  ?K 3.8 4.1 4.5 4.9  ?CL 107 108 105 104  ?CO2 '27 30 27 29  '$ ?GLUCOSE 132* 110* 107* 89  ?BUN 25* 22 27*

## 2022-04-17 ENCOUNTER — Ambulatory Visit: Payer: Medicare Other | Admitting: Cardiovascular Disease

## 2022-04-18 ENCOUNTER — Ambulatory Visit (HOSPITAL_BASED_OUTPATIENT_CLINIC_OR_DEPARTMENT_OTHER): Payer: Medicare Other | Admitting: Orthopaedic Surgery

## 2022-04-29 ENCOUNTER — Ambulatory Visit: Payer: Medicare Other | Admitting: Family

## 2022-05-29 ENCOUNTER — Ambulatory Visit (INDEPENDENT_AMBULATORY_CARE_PROVIDER_SITE_OTHER): Payer: Medicare Other | Admitting: Family

## 2022-05-29 ENCOUNTER — Encounter: Payer: Self-pay | Admitting: Family

## 2022-05-29 VITALS — BP 102/60 | HR 86 | Temp 97.8°F | Resp 16 | Ht <= 58 in

## 2022-05-29 DIAGNOSIS — R300 Dysuria: Secondary | ICD-10-CM

## 2022-05-29 DIAGNOSIS — R350 Frequency of micturition: Secondary | ICD-10-CM

## 2022-05-29 DIAGNOSIS — H1033 Unspecified acute conjunctivitis, bilateral: Secondary | ICD-10-CM

## 2022-05-29 LAB — POCT URINALYSIS DIPSTICK
Bilirubin, UA: NEGATIVE
Glucose, UA: NEGATIVE
Ketones, UA: POSITIVE
Nitrite, UA: POSITIVE
Protein, UA: NEGATIVE
Spec Grav, UA: <=1.005 — AB
Urobilinogen, UA: NEGATIVE U/dL — AB
pH, UA: 8.5 — AB

## 2022-05-29 NOTE — Progress Notes (Signed)
Provider: Marlowe Sax FNP-C  Miquel Lamson, Nelda Bucks, NP  Patient Care Team: Alajah Witman, Nelda Bucks, NP as PCP - General (Family Medicine) Josue Hector, MD as PCP - Cardiology (Cardiology) Peggye Form, MD as Referring Physician (Internal Medicine) Garald Balding, MD as Attending Physician (Orthopedic Surgery) Debbra Riding, MD as Consulting Physician (Ophthalmology) Noralee Space, MD as Consulting Physician (Pulmonary Disease)  Extended Emergency Contact Information Primary Emergency Contact: Eads,Michael Address: [place], [place] [address] [place] Phone: [phone] Mobile Phone: [phone] Relation: Son Secondary Emergency Contact: [facility] Phone: [phone] Relation: Other Preferred language: Cleophus Molt Interpreter needed? No  Code Status:  DNR Goals of care: Advanced Directive information    05/29/2022    3:07 PM  Advanced Directives  Does Patient Have a Medical Advance Directive? Yes  Type of Paramedic of [place];Living will;Out of facility DNR (pink MOST or yellow form)  Does patient want to make changes to medical advance directive? No - Patient declined  Copy of [place] in Chart? No - copy requested     Chief Complaint  Patient presents with   Acute Visit    Patient complains of coughing up white phlegm. Also concerns of pink eye.    HPI:  Pt is a 88 y.o. female seen today for an acute visit for evaluation of cough and eye redness x 3 weeks.  Son present today states patient was prescribed eyedrops by another provider but patient declined to use them stating the provider was not her regular ophthalmologist and will only use eyedrops if it was prescribed by her ophthalmologist. Discussed use of eyedrops with the patient but has also declined for me to prescribe for her eyedrops.Son called ophthalmology's office during the visit and has been given an  appointment in 4 days.  Usually follows up with Dr. Katy Fitch. Has been waking up with the yellow drainage on the eyelashes sometimes eyes stuck unable to open. She denies any pain or vision changes. Does state eyes itching.  Very difficult for provider Also complains of urine frequency and dysuria.denies any fever,chills,nausea,vomiting,abdominal pain,flank pain,urgency,difficult urination or hematuria.  Total direct patient since patient is very hard of hearing and wants everything done away", 89 going to be 89 and nobody knows my body more than I do".  Past Medical History:  Diagnosis Date   Abdominal pain, left lower quadrant 06/22/2013   Abnormal CT scan of lung 07/16/2015   Allergic rhinitis, cause unspecified 02/21/2014   Anxiety state, unspecified    Breast cancer ([place])    NO STICK OR BLOOD PRESSURE CHECKS IN LEFT ARM   Carpal tunnel syndrome    Carpal tunnel syndrome on both sides 10/03/2008   Qualifier: Diagnosis of  By: Lenna Gilford MD, Deborra Medina    Cervical spondylosis    Chronic pain syndrome 03/03/2019   Colon polyp 12/19/2009   Transverse-polypoid colorectal mucosa   Constipation    Cough 10/21/2014   Degenerative joint disease    Diastolic dysfunction    Diverticula of colon    Diverticulosis of colon 05/24/2002   Qualifier: Diagnosis of  By: Jerral Ralph     Gastritis, chronic    Hiatal hernia    Hypertension    Low back pain    LPRD (laryngopharyngeal reflux disease) 04/04/2014   Osteoporosis 05/22/2020   RBBB (right bundle branch block with left anterior fascicular block)    Renal cyst  Venous insufficiency    Past Surgical History:  Procedure Laterality Date   CARPAL TUNNEL RELEASE     INTRAMEDULLARY (IM) NAIL INTERTROCHANTERIC Right 11/13/2021   Procedure: INTRAMEDULLARY (IM) NAIL INTERTROCHANTRIC;  Surgeon: Vanetta Mulders, MD;  Location: [facility];  Service: Orthopedics;  Laterality: Right;   Left mastectomy      Allergies  Allergen Reactions   Fentanyl Nausea  Only and Other (See Comments)    Pt states that she is not allergic     Naprosyn [Naproxen] Other (See Comments)    Patient felt tongue and face swelling. Went to ED where no visible swelling noted    Outpatient Encounter Medications as of 05/29/2022  Medication Sig   acetaminophen (TYLENOL) 500 MG tablet Take 500 mg by mouth every 6 (six) hours as needed for moderate pain or headache.   carvedilol (COREG) 3.125 MG tablet Take 1 tablet (3.125 mg total) by mouth 2 (two) times daily. Hold for SBP <110 or HR <70   cholecalciferol (VITAMIN D) 1000 units tablet Take 1,000 Units by mouth daily.   famotidine (PEPCID) 20 MG tablet Take 1 tablet (20 mg total) by mouth daily.   feeding supplement, ENSURE ENLIVE, (ENSURE ENLIVE) LIQD Take 237 mLs by mouth 2 (two) times daily between meals.   furosemide (LASIX) 40 MG tablet Take 40 mg by mouth daily.   levocetirizine (XYZAL) 5 MG tablet Take 5 mg by mouth as needed for allergies.   lidocaine (LIDODERM) 5 % Place 1 patch onto the skin as needed. Remove & Discard patch within 12 hours or as directed by MD   Multiple Vitamins-Minerals (MULTIVITAMINS THER. W/MINERALS) TABS Take 1 tablet by mouth daily.   polyethylene glycol (MIRALAX / GLYCOLAX) 17 g packet Take 17 g by mouth daily.   sennosides-docusate sodium (SENOKOT-S) 8.6-50 MG tablet Take 1-2 tablets by mouth daily as needed for constipation.   spironolactone (ALDACTONE) 25 MG tablet Take 25 mg by mouth daily.   zinc oxide (BALMEX) 11.3 % CREA cream Apply 1 application topically 2 (two) times daily as needed.   Propylene Glycol (SYSTANE COMPLETE OP) Apply 1 drop to eye daily as needed (dry eyes). (Patient not taking: Reported on 05/29/2022)   [DISCONTINUED] enoxaparin (LOVENOX) 30 MG/0.3ML injection Inject 0.3 mLs (30 mg total) into the skin daily. Total 30 days for DVT prophylaxis, last dose 12/13/21.   [DISCONTINUED] furosemide (LASIX) 20 MG tablet Take 0.5 tablets (10 mg total) by mouth every other day  as needed for fluid or edema. TAKE 1/2 (ONE-HALF) TABLET BY MOUTH EVERY other DAY   [DISCONTINUED] levocetirizine (XYZAL) 5 MG tablet Take 1 tablet (5 mg total) by mouth daily.   [DISCONTINUED] saccharomyces boulardii (FLORASTOR) 250 MG capsule Take 1 capsule (250 mg total) by mouth 2 (two) times daily. X 10 days   No facility-administered encounter medications on file as of 05/29/2022.    Review of Systems  Constitutional:  Negative for appetite change, chills, fatigue, fever and unexpected weight change.  HENT:  Positive for hearing loss. Negative for congestion, ear discharge, ear pain, facial swelling, nosebleeds, postnasal drip, rhinorrhea, sinus pressure, sinus pain, sneezing, sore throat and tinnitus.   Eyes:  Positive for discharge, redness and itching. Negative for pain and visual disturbance.  Respiratory:  Negative for cough, chest tightness, shortness of breath and wheezing.   Cardiovascular:  Negative for chest pain, palpitations and leg swelling.  Genitourinary:  Positive for dysuria and frequency. Negative for difficulty urinating, flank pain and urgency.  Skin:  Negative for color change, pallor and rash.  Neurological:  Negative for dizziness, syncope, speech difficulty, weakness, light-headedness, numbness and headaches.  Psychiatric/Behavioral:  Positive for agitation. Negative for behavioral problems, confusion, hallucinations and sleep disturbance. The patient is not nervous/anxious.     Immunization History  Administered Date(s) Administered   Fluad Quad(high Dose 65+) 08/19/2021   Influenza, High Dose Seasonal PF 08/21/2015   Influenza,inj,Quad PF,6+ Mos 09/18/2015   PFIZER(Purple Top)SARS-COV-2 Vaccination 04/18/2020, 05/10/2020   Pneumococcal Polysaccharide-23 02/07/2015   Pertinent  Health Maintenance Due  Topic Date Due   INFLUENZA VACCINE  06/17/2022   DEXA SCAN  Completed      11/24/2021   11:00 PM 11/25/2021   10:30 AM 11/25/2021    9:35 PM 01/22/2022    1:08  PM 05/29/2022    3:07 PM  Fall Risk  Falls in the past year?    1 0  Was there an injury with Fall?    1 0  Fall Risk Category Calculator    2 0  Fall Risk Category    Moderate Low  Patient Fall Risk Level High fall risk High fall risk High fall risk Moderate fall risk Low fall risk  Patient at Risk for Falls Due to    History of fall(s) No Fall Risks  Fall risk Follow up    Falls evaluation completed;Education provided;Falls prevention discussed Falls evaluation completed   Functional Status Survey:    Vitals:   05/29/22 1500  BP: 102/60  Pulse: 86  Resp: 16  Temp: 97.8 F (36.6 C)  SpO2: 96%  Height: '4\' 10"'$  (1.473 m)   Body mass index is 22.99 kg/m. Physical Exam Vitals reviewed.  Constitutional:      General: She is not in acute distress.    Appearance: Normal appearance. She is normal weight. She is not ill-appearing or diaphoretic.  HENT:     Head: Normocephalic.     Right Ear: Tympanic membrane, ear canal and external ear normal. There is no impacted cerumen.     Left Ear: Tympanic membrane, ear canal and external ear normal. There is no impacted cerumen.     Nose: Nose normal. No congestion or rhinorrhea.     Mouth/Throat:     Mouth: Mucous membranes are moist.     Pharynx: Oropharynx is clear. No oropharyngeal exudate or posterior oropharyngeal erythema.  Eyes:     General: No scleral icterus.       Right eye: No discharge.        Left eye: No discharge.     Extraocular Movements: Extraocular movements intact.     Conjunctiva/sclera:     Right eye: Right conjunctiva is injected. Exudate present.     Left eye: Left conjunctiva is injected. Exudate present.     Pupils: Pupils are equal, round, and reactive to light.  Neck:     Vascular: No carotid bruit.  Cardiovascular:     Rate and Rhythm: Normal rate and regular rhythm.     Pulses: Normal pulses.     Heart sounds: Normal heart sounds. No murmur heard.    No friction rub. No gallop.  Pulmonary:      Effort: Pulmonary effort is normal. No respiratory distress.     Breath sounds: Normal breath sounds. No wheezing, rhonchi or rales.  Chest:     Chest wall: No tenderness.  Abdominal:     General: Bowel sounds are normal. There is no distension.     Palpations: Abdomen is soft.  There is no mass.     Tenderness: There is no abdominal tenderness. There is no right CVA tenderness, left CVA tenderness, guarding or rebound.  Musculoskeletal:     Cervical back: Normal range of motion. No rigidity or tenderness.  Lymphadenopathy:     Cervical: No cervical adenopathy.  Skin:    General: Skin is warm and dry.     Coloration: Skin is not pale.     Findings: No erythema or rash.  Neurological:     Mental Status: She is alert. Mental status is at baseline.     Motor: No weakness.     Gait: Gait abnormal.  Psychiatric:        Mood and Affect: Mood normal.        Speech: Speech normal.        Behavior: Behavior is agitated.     Labs reviewed: Recent Labs    11/14/21 0359 11/16/21 0303 11/20/21 0810 01/22/22 1400  NA 144 142 137 141  K 3.8 4.1 4.5 4.9  CL 107 108 105 104  CO2 '27 30 27 29  '$ GLUCOSE 132* 110* 107* 89  BUN 25* 22 27* 30*  CREATININE 0.83 0.72 0.73 0.85  CALCIUM 8.2* 8.0* 8.4* 9.3  MG 2.4  --  2.1  --   PHOS  --   --  3.2  --    Recent Labs    11/11/21 2115 11/12/21 0408  AST 48* 54*  ALT 28 28  ALKPHOS 40 41  BILITOT 1.3* 1.3*  PROT 6.7 6.7  ALBUMIN 3.7 3.4*   Recent Labs    06/10/21 1128 11/11/21 2115 11/12/21 0408 11/16/21 0303 11/20/21 0810 01/22/22 1400  WBC 4.5 9.9   < > 7.3 6.5 6.3  NEUTROABS 2.6 7.4  --   --   --  3,648  HGB 11.9* 12.7   < > 8.9* 9.5* 11.5*  HCT 37.7 40.0   < > 27.4* 29.8* 35.9  MCV 98.7 97.3   < > 95.8 96.4 92.5  PLT 174 185   < > 166 376 331   < > = values in this interval not displayed.   Lab Results  Component Value Date   TSH 3.14 10/09/2020   No results found for: "HGBA1C" Lab Results  Component Value Date    CHOL 199 02/02/2014   HDL 98.70 02/02/2014   LDLCALC 96 02/02/2014   TRIG 20.0 02/02/2014   CHOLHDL 2 02/02/2014    Significant Diagnostic Results in last 30 days:  No results found.  Assessment/Plan 1. Acute bacterial conjunctivitis of both eyes Afebrile  Recommend garamycin eye drops but declines any eye drops prescribed wants medication to be prescribed by ophthalmology.  Patient's son called ophthalmologist office during visit next appointment available in 4 days patient made aware but insisted on seeing the ophthalmologist for her eye condition. -Advised to cleanse eye with warm water and baby shampoo 3 times daily -Follow-up with Dr. Carolynn Sayers for bilateral eye bacterial conjunctivitis as requested  2. Dysuria Negative exam findings - Urine Culture - POC Urinalysis Dipstick indicates yellow cloudy urine positive for ketones, large blood, nitrites and large 3+ leukocytes -We will send urine for culture would like to wait for the final urine culture prior to taking any antibiotics  3. Urine frequency -Advised to increase fluid intake - Urine Culture - POC Urinalysis Dipstick   Family/ staff Communication: Reviewed plan of care with patient and son verbalized understanding  Labs/tests ordered:  - Urine Culture -  POC Urinalysis Dipstick  Next Appointment: Return if symptoms worsen or fail to improve.   Sandrea Hughs, NP

## 2022-05-29 NOTE — Patient Instructions (Signed)
-   follow up with Dr.Groat for bilateral eye infection as soon as possible as requested.

## 2022-05-30 LAB — URINE CULTURE
MICRO NUMBER:: 13643750
SPECIMEN QUALITY:: ADEQUATE

## 2022-07-08 ENCOUNTER — Encounter: Payer: Self-pay | Admitting: Family

## 2022-07-08 ENCOUNTER — Ambulatory Visit (INDEPENDENT_AMBULATORY_CARE_PROVIDER_SITE_OTHER): Payer: Medicare Other | Admitting: Family

## 2022-07-08 VITALS — BP 110/70 | HR 80 | Temp 98.2°F | Resp 16 | Ht <= 58 in

## 2022-07-08 DIAGNOSIS — R350 Frequency of micturition: Secondary | ICD-10-CM

## 2022-07-08 DIAGNOSIS — H1033 Unspecified acute conjunctivitis, bilateral: Secondary | ICD-10-CM | POA: Diagnosis not present

## 2022-07-08 LAB — POCT URINALYSIS DIPSTICK
Bilirubin, UA: NEGATIVE
Glucose, UA: NEGATIVE
Nitrite, UA: NEGATIVE
Protein, UA: NEGATIVE
Spec Grav, UA: 1.015 (ref 1.010–1.025)
Urobilinogen, UA: NEGATIVE E.U./dL — AB
pH, UA: 5 (ref 5.0–8.0)

## 2022-07-08 MED ORDER — POLYMYXIN B-TRIMETHOPRIM 10000-0.1 UNIT/ML-% OP SOLN: 1.0000 [drp] | Freq: Four times a day (QID) | OPHTHALMIC | 0 refills | Status: AC

## 2022-07-08 NOTE — Progress Notes (Signed)
Provider: Marlowe Sax FNP-C  Karine Garn, Nelda Bucks, NP  Patient Care Team: Fallon Haecker, Nelda Bucks, NP as PCP - General (Family Medicine) Josue Hector, MD as PCP - Cardiology (Cardiology) Peggye Form, MD as Referring Physician (Internal Medicine) Garald Balding, MD as Attending Physician (Orthopedic Surgery) Debbra Riding, MD as Consulting Physician (Ophthalmology) Noralee Space, MD as Consulting Physician (Pulmonary Disease)  Extended Emergency Contact Information Primary Emergency Contact: Solum,Michael Address: [address], [place] [address] [place] of [place] Phone: [phone] Mobile Phone: [phone] Relation: Son Secondary Emergency Contact: [facility] Phone: [phone] Relation: Other Preferred language: Cleophus Molt Interpreter needed? No  Code Status: Full code  Goals of care: Advanced Directive information    07/08/2022    2:06 PM  Advanced Directives  Does Patient Have a Medical Advance Directive? Yes  Type of Paramedic of [place];Living will;Out of facility DNR (pink MOST or yellow form)  Does patient want to make changes to medical advance directive? No - Patient declined  Copy of [place] in Chart? No - copy requested     Chief Complaint  Patient presents with   Acute Visit    Patient complains of urinary frequency.     HPI:  Pt is a 88 y.o. female seen today for an acute visit for evaluation of urine frequency,urgency and dysuria .She is here with his son who provides additional HPI information. States patient has gotten more confused for the past several days.  Also has worsening eyelid redness.  She was prescribed polymyxin B/TMP 4 times daily by her ophthalmologist.  Son states had been assisting her with eyedrops but patient rarely uses eyedrops more than twice a day.Suspect patient squeezed eyedrops down the drain so that she does not use it. Son just found  eyedrop bottle empty. She denies any fever,chills,nausea,vomiting,abdominal pain,flank pain,difficult urination or hematuria   Past Medical History:  Diagnosis Date   Abdominal pain, left lower quadrant 06/22/2013   Abnormal CT scan of lung 07/16/2015   Allergic rhinitis, cause unspecified 02/21/2014   Anxiety state, unspecified    Breast cancer ([place])    NO STICK OR BLOOD PRESSURE CHECKS IN LEFT ARM   Carpal tunnel syndrome    Carpal tunnel syndrome on both sides 10/03/2008   Qualifier: Diagnosis of  By: Lenna Gilford MD, Deborra Medina    Cervical spondylosis    Chronic pain syndrome 03/03/2019   Colon polyp 12/19/2009   Transverse-polypoid colorectal mucosa   Constipation    Cough 10/21/2014   Degenerative joint disease    Diastolic dysfunction    Diverticula of colon    Diverticulosis of colon 05/24/2002   Qualifier: Diagnosis of  By: Jerral Ralph     Gastritis, chronic    Hiatal hernia    Hypertension    Low back pain    LPRD (laryngopharyngeal reflux disease) 04/04/2014   Osteoporosis 05/22/2020   RBBB (right bundle branch block with left anterior fascicular block)    Renal cyst    Venous insufficiency    Past Surgical History:  Procedure Laterality Date   CARPAL TUNNEL RELEASE     INTRAMEDULLARY (IM) NAIL INTERTROCHANTERIC Right 11/13/2021   Procedure: INTRAMEDULLARY (IM) NAIL INTERTROCHANTRIC;  Surgeon: Vanetta Mulders, MD;  Location: [facility];  Service: Orthopedics;  Laterality: Right;   Left mastectomy      Allergies  Allergen Reactions   Fentanyl Nausea Only and Other (See Comments)  Pt states that she is not allergic     Naprosyn [Naproxen] Other (See Comments)    Patient felt tongue and face swelling. Went to ED where no visible swelling noted    Outpatient Encounter Medications as of 07/08/2022  Medication Sig   acetaminophen (TYLENOL) 500 MG tablet Take 500 mg by mouth every 6 (six) hours as needed for moderate pain or headache.   carvedilol (COREG) 3.125 MG tablet  Take 1 tablet (3.125 mg total) by mouth 2 (two) times daily. Hold for SBP <110 or HR <70   cholecalciferol (VITAMIN D) 1000 units tablet Take 1,000 Units by mouth daily.   famotidine (PEPCID) 20 MG tablet Take 1 tablet (20 mg total) by mouth daily.   feeding supplement, ENSURE ENLIVE, (ENSURE ENLIVE) LIQD Take 237 mLs by mouth 2 (two) times daily between meals.   furosemide (LASIX) 40 MG tablet Take 40 mg by mouth daily.   levocetirizine (XYZAL) 5 MG tablet Take 5 mg by mouth as needed for allergies.   lidocaine (LIDODERM) 5 % Place 1 patch onto the skin as needed. Remove & Discard patch within 12 hours or as directed by MD   LORazepam (ATIVAN) 0.5 MG tablet Take 0.5 mg by mouth as needed.   Multiple Vitamins-Minerals (MULTIVITAMINS THER. W/MINERALS) TABS Take 1 tablet by mouth daily.   polyethylene glycol (MIRALAX / GLYCOLAX) 17 g packet Take 17 g by mouth daily.   Propylene Glycol (SYSTANE COMPLETE OP) Apply 1 drop to eye daily as needed (dry eyes).   sennosides-docusate sodium (SENOKOT-S) 8.6-50 MG tablet Take 1-2 tablets by mouth daily as needed for constipation.   spironolactone (ALDACTONE) 25 MG tablet Take 25 mg by mouth daily.   zinc oxide (BALMEX) 11.3 % CREA cream Apply 1 application topically 2 (two) times daily as needed.   No facility-administered encounter medications on file as of 07/08/2022.    Review of Systems  Constitutional:  Negative for appetite change, chills, fatigue, fever and unexpected weight change.  HENT:  Negative for congestion, ear discharge, ear pain, hearing loss, nosebleeds, postnasal drip, rhinorrhea, sinus pressure, sinus pain, sneezing, sore throat, tinnitus and trouble swallowing.   Eyes:  Positive for discharge. Negative for pain, redness, itching and visual disturbance.       Eyelid redness   Respiratory:  Negative for cough, chest tightness, shortness of breath and wheezing.   Cardiovascular:  Negative for chest pain, palpitations and leg swelling.   Gastrointestinal:  Negative for abdominal distention, abdominal pain, blood in stool, constipation, diarrhea, nausea and vomiting.  Genitourinary:  Positive for dysuria, frequency and urgency. Negative for difficulty urinating and flank pain.  Musculoskeletal:  Positive for arthralgias and gait problem. Negative for back pain, joint swelling and myalgias.  Skin:  Negative for color change, pallor and rash.  Neurological:  Negative for dizziness, speech difficulty, weakness, light-headedness, numbness and headaches.  Psychiatric/Behavioral:  Positive for confusion. Negative for agitation, behavioral problems, hallucinations and sleep disturbance. The patient is not nervous/anxious.     Immunization History  Administered Date(s) Administered   Fluad Quad(high Dose 65+) 08/19/2021   Influenza, High Dose Seasonal PF 08/21/2015   Influenza,inj,Quad PF,6+ Mos 09/18/2015   PFIZER(Purple Top)SARS-COV-2 Vaccination 04/18/2020, 05/10/2020   Pneumococcal Polysaccharide-23 02/07/2015   Pertinent  Health Maintenance Due  Topic Date Due   INFLUENZA VACCINE  06/17/2022   DEXA SCAN  Completed      11/25/2021   10:30 AM 11/25/2021    9:35 PM 01/22/2022    1:08 PM  05/29/2022    3:07 PM 07/08/2022    2:05 PM  Fall Risk  Falls in the past year?   1 0 1  Was there an injury with Fall?   1 0 0  Fall Risk Category Calculator   2 0 1  Fall Risk Category   Moderate Low Low  Patient Fall Risk Level High fall risk High fall risk Moderate fall risk Low fall risk Low fall risk  Patient at Risk for Falls Due to   History of fall(s) No Fall Risks History of fall(s);Impaired balance/gait;Impaired mobility  Fall risk Follow up   Falls evaluation completed;Education provided;Falls prevention discussed Falls evaluation completed Falls evaluation completed;Education provided;Falls prevention discussed   Functional Status Survey:    Vitals:   07/08/22 1344  BP: 110/70  Pulse: 80  Resp: 16  Temp: 98.2 F (36.8 C)   SpO2: 98%  Height: '4\' 10"'$  (1.473 m)   Body mass index is 22.99 kg/m. Physical Exam Vitals reviewed.  Constitutional:      General: She is not in acute distress.    Appearance: Normal appearance. She is normal weight. She is not ill-appearing or diaphoretic.  HENT:     Head: Normocephalic.     Nose: Nose normal. No congestion or rhinorrhea.     Mouth/Throat:     Mouth: Mucous membranes are moist.     Pharynx: Oropharynx is clear. No oropharyngeal exudate or posterior oropharyngeal erythema.  Eyes:     General: No scleral icterus.       Right eye: Discharge present.        Left eye: Discharge present.    Extraocular Movements: Extraocular movements intact.     Conjunctiva/sclera:     Right eye: Right conjunctiva is injected. Exudate present.     Left eye: Left conjunctiva is injected. Exudate present.     Pupils: Pupils are equal, round, and reactive to light.     Comments: Bilateral eyelid erythema  Neck:     Vascular: No carotid bruit.  Cardiovascular:     Rate and Rhythm: Normal rate and regular rhythm.     Pulses: Normal pulses.     Heart sounds: Normal heart sounds. No murmur heard.    No friction rub. No gallop.  Pulmonary:     Effort: Pulmonary effort is normal. No respiratory distress.     Breath sounds: Normal breath sounds. No wheezing, rhonchi or rales.  Chest:     Chest wall: No tenderness.  Abdominal:     General: Bowel sounds are normal. There is no distension.     Palpations: Abdomen is soft. There is no mass.     Tenderness: There is abdominal tenderness in the suprapubic area. There is no right CVA tenderness, left CVA tenderness, guarding or rebound.  Musculoskeletal:        General: No swelling or tenderness.     Cervical back: Normal range of motion. No rigidity or tenderness.     Right lower leg: No edema.     Left lower leg: No edema.     Comments: Unsteady gait   Lymphadenopathy:     Cervical: No cervical adenopathy.  Skin:    General: Skin is  warm and dry.     Coloration: Skin is not pale.     Findings: No bruising, erythema, lesion or rash.  Neurological:     Mental Status: She is alert and oriented to person, place, and time. Mental status is at baseline.  Motor: No weakness.     Gait: Gait abnormal.  Psychiatric:        Mood and Affect: Affect is inappropriate.        Speech: Speech normal.        Behavior: Behavior normal.     Labs reviewed: Recent Labs    11/14/21 0359 11/16/21 0303 11/20/21 0810 01/22/22 1400  NA 144 142 137 141  K 3.8 4.1 4.5 4.9  CL 107 108 105 104  CO2 '27 30 27 29  '$ GLUCOSE 132* 110* 107* 89  BUN 25* 22 27* 30*  CREATININE 0.83 0.72 0.73 0.85  CALCIUM 8.2* 8.0* 8.4* 9.3  MG 2.4  --  2.1  --   PHOS  --   --  3.2  --    Recent Labs    11/11/21 2115 11/12/21 0408  AST 48* 54*  ALT 28 28  ALKPHOS 40 41  BILITOT 1.3* 1.3*  PROT 6.7 6.7  ALBUMIN 3.7 3.4*   Recent Labs    11/11/21 2115 11/12/21 0408 11/16/21 0303 11/20/21 0810 01/22/22 1400  WBC 9.9   < > 7.3 6.5 6.3  NEUTROABS 7.4  --   --   --  3,648  HGB 12.7   < > 8.9* 9.5* 11.5*  HCT 40.0   < > 27.4* 29.8* 35.9  MCV 97.3   < > 95.8 96.4 92.5  PLT 185   < > 166 376 331   < > = values in this interval not displayed.   Lab Results  Component Value Date   TSH 3.14 10/09/2020   No results found for: "HGBA1C" Lab Results  Component Value Date   CHOL 199 02/02/2014   HDL 98.70 02/02/2014   LDLCALC 96 02/02/2014   TRIG 20.0 02/02/2014   CHOLHDL 2 02/02/2014    Significant Diagnostic Results in last 30 days:  No results found.  Assessment/Plan 1. Urinary frequency Afebrile Suprapubic tenderness on palpation no bladder distention noted. - POC Urinalysis Dipstick indicates yellow cloudy urine positive for ketones, large blood and large 3+ leukocytes. Results discussed with patient and son will send urine for urine culture then we will treat it with antibiotics if indicated. Advised to notify provider if symptoms  worsen or running any fever or chills. - Urine Culture  2. Acute bacterial conjunctivitis of both eyes Bilateral eye and eyelids erythema with exudate noted Antibiotics eyedrops previously prescribed by patient ophthalmologist but did not use it as instructed and son thinks patient squeezed all eyedrops into the drainage. Will prescribe eyedrops discussed with son to administer eyedrop and keep it out of reach of the patient. -Notify provider if symptoms worsen or follow-up with the ophthalmologist - trimethoprim-polymyxin b (POLYTRIM) ophthalmic solution; Place 1 drop into both eyes every 6 (six) hours for 7 days.  Dispense: 10 mL; Refill: 0   Family/ staff Communication: Reviewed plan of care with patient and son verbalized understanding  Labs/tests ordered:  - POC Urinalysis Dipstick - Urine Culture  Next Appointment: Return if symptoms worsen or fail to improve.   Sandrea Hughs, NP

## 2022-07-10 LAB — URINE CULTURE
MICRO NUMBER:: 13815665
SPECIMEN QUALITY:: ADEQUATE

## 2022-07-11 ENCOUNTER — Other Ambulatory Visit: Payer: Self-pay

## 2022-07-11 MED ORDER — CIPROFLOXACIN 500 MG/5ML (10%) PO SUSR: 500.0000 mg | Freq: Two times a day (BID) | ORAL | 0 refills | Status: DC

## 2022-07-11 MED ORDER — SACCHAROMYCES BOULARDII 250 MG PO CAPS: 250.0000 mg | ORAL_CAPSULE | Freq: Every day | ORAL | 0 refills | Status: DC

## 2022-07-11 MED ORDER — CIPROFLOXACIN HCL 500 MG PO TABS: 500.0000 mg | ORAL_TABLET | Freq: Two times a day (BID) | ORAL | 0 refills | Status: AC

## 2022-07-11 NOTE — Addendum Note (Signed)
Addended by: Debe Coder on: 07/11/2022 11:38 AM   Modules accepted: Orders

## 2022-07-22 ENCOUNTER — Telehealth: Payer: Self-pay

## 2022-07-22 NOTE — Telephone Encounter (Signed)
Patient's son, [name] called and left voicemail message on clinical intake voicemail. He states that sore on patient's ankle is still pretty raw. Is there anything you would recommend using on it.  Message routed to Sherrie Mustache, NP

## 2022-07-22 NOTE — Telephone Encounter (Signed)
Called and spoke with son, I informed him he would need in office evaluation, he stated that he wants to talk to [facility] first to see what they say and go from there.

## 2022-07-22 NOTE — Telephone Encounter (Signed)
Will need in office evaluation

## 2022-07-22 NOTE — Telephone Encounter (Signed)
Noted, thank you

## 2022-08-07 ENCOUNTER — Encounter: Payer: Medicare Other | Admitting: Family

## 2022-08-14 ENCOUNTER — Encounter: Payer: Medicare Other | Admitting: Family

## 2022-08-18 DIAGNOSIS — S72001D Fracture of unspecified part of neck of right femur, subsequent encounter for closed fracture with routine healing: Secondary | ICD-10-CM | POA: Diagnosis not present

## 2022-08-18 DIAGNOSIS — M6281 Muscle weakness (generalized): Secondary | ICD-10-CM | POA: Diagnosis not present

## 2022-08-21 ENCOUNTER — Encounter: Payer: Self-pay | Admitting: Family

## 2022-08-21 ENCOUNTER — Ambulatory Visit (INDEPENDENT_AMBULATORY_CARE_PROVIDER_SITE_OTHER): Payer: Medicare Other | Admitting: Family

## 2022-08-21 VITALS — BP 100/60 | HR 71 | Temp 97.3°F | Resp 16 | Ht <= 58 in

## 2022-08-21 DIAGNOSIS — H903 Sensorineural hearing loss, bilateral: Secondary | ICD-10-CM | POA: Diagnosis not present

## 2022-08-21 DIAGNOSIS — Z0000 Encounter for general adult medical examination without abnormal findings: Secondary | ICD-10-CM | POA: Diagnosis not present

## 2022-08-21 NOTE — Progress Notes (Signed)
Subjective:   Heather [name] is a 88 y.o. female who presents for Medicare Annual (Subsequent) preventive examination.  Review of Systems     Cardiac Risk Factors include: advanced age (>60mn, >>83women);hypertension;sedentary lifestyle     Objective:    Today's Vitals   08/21/22 1348 08/21/22 1412  BP: 100/60   Pulse: 71   Resp: 16   Temp: (!) 97.3 F (36.3 C)   SpO2: 94%   Height: '4\' 10"'$  (1.473 m)   PainSc:  10-Worst pain ever   Body mass index is 22.99 kg/m.     07/08/2022    2:06 PM 05/29/2022    3:07 PM 01/22/2022    1:08 PM 11/11/2021    5:33 PM 11/11/2021    9:33 AM 08/19/2021    2:29 PM 08/02/2021    2:06 PM  Advanced Directives  Does Patient Have a Medical Advance Directive? Yes Yes No Yes Yes Yes Yes  Type of [place];Out of facility DNR (pink MOST or yellow form) [place]Living will;Out of facility DNR (pink MOST or yellow form)  Living will Living will Living will Living will  Does patient want to make changes to medical advance directive? No - Patient declined No - Patient declined  No - Patient declined  No - Patient declined No - Patient declined  Copy of [place] Chart? No - copy requested No - copy requested       Would patient like information on creating a medical advance directive?   No - Patient declined        Current Medications (verified) Outpatient Encounter Medications as of 08/21/2022  Medication Sig   acetaminophen (TYLENOL) 500 MG tablet Take 500 mg by mouth every 6 (six) hours as needed for moderate pain or headache.   carvedilol (COREG) 3.125 MG tablet Take 1 tablet (3.125 mg total) by mouth 2 (two) times daily. Hold for SBP <110 or HR <70   cholecalciferol (VITAMIN D) 1000 units tablet Take 1,000 Units by mouth daily.   famotidine (PEPCID) 20 MG tablet Take 1 tablet (20 mg total) by mouth daily.   feeding supplement, ENSURE ENLIVE, (ENSURE  ENLIVE) LIQD Take 237 mLs by mouth 2 (two) times daily between meals.   furosemide (LASIX) 40 MG tablet Take 40 mg by mouth daily.   levocetirizine (XYZAL) 5 MG tablet Take 5 mg by mouth as needed for allergies.   lidocaine (LIDODERM) 5 % Place 1 patch onto the skin as needed. Remove & Discard patch within 12 hours or as directed by MD   LORazepam (ATIVAN) 0.5 MG tablet Take 0.5 mg by mouth as needed.   Multiple Vitamins-Minerals (MULTIVITAMINS THER. W/MINERALS) TABS Take 1 tablet by mouth daily.   polyethylene glycol (MIRALAX / GLYCOLAX) 17 g packet Take 17 g by mouth daily.   Propylene Glycol (SYSTANE COMPLETE OP) Apply 1 drop to eye daily as needed (dry eyes).   saccharomyces boulardii (FLORASTOR) 250 MG capsule Take 1 capsule (250 mg total) by mouth daily. Patient to take one time daily by mouth for 10 days   sennosides-docusate sodium (SENOKOT-S) 8.6-50 MG tablet Take 1-2 tablets by mouth daily as needed for constipation.   spironolactone (ALDACTONE) 25 MG tablet Take 25 mg by mouth daily.   zinc oxide (BALMEX) 11.3 % CREA cream Apply 1 application topically 2 (two) times daily as needed.   No facility-administered encounter medications on file as of 08/21/2022.  Allergies (verified) Fentanyl and Naprosyn [naproxen]   History: Past Medical History:  Diagnosis Date   Abdominal pain, left lower quadrant 06/22/2013   Abnormal CT scan of lung 07/16/2015   Allergic rhinitis, cause unspecified 02/21/2014   Anxiety state, unspecified    Breast cancer ([place])    NO STICK OR BLOOD PRESSURE CHECKS IN LEFT ARM   Carpal tunnel syndrome    Carpal tunnel syndrome on both sides 10/03/2008   Qualifier: Diagnosis of  By: Lenna Gilford MD, Deborra Medina    Cervical spondylosis    Chronic pain syndrome 03/03/2019   Colon polyp 12/19/2009   Transverse-polypoid colorectal mucosa   Constipation    Cough 10/21/2014   Degenerative joint disease    Diastolic dysfunction    Diverticula of colon    Diverticulosis of  colon 05/24/2002   Qualifier: Diagnosis of  By: Jerral Ralph     Gastritis, chronic    Hiatal hernia    Hypertension    Low back pain    LPRD (laryngopharyngeal reflux disease) 04/04/2014   Osteoporosis 05/22/2020   RBBB (right bundle branch block with left anterior fascicular block)    Renal cyst    Venous insufficiency    Past Surgical History:  Procedure Laterality Date   CARPAL TUNNEL RELEASE     INTRAMEDULLARY (IM) NAIL INTERTROCHANTERIC Right 11/13/2021   Procedure: INTRAMEDULLARY (IM) NAIL INTERTROCHANTRIC;  Surgeon: Vanetta Mulders, MD;  Location: [facility];  Service: Orthopedics;  Laterality: Right;   Left mastectomy     Family History  Problem Relation Age of Onset   Colon cancer Neg Hx    Pancreatic cancer Neg Hx    Esophageal cancer Neg Hx    Stomach cancer Neg Hx    Social History   Socioeconomic History   Marital status: Widowed    Spouse name: Not on file   Number of children: 1   Years of education: Not on file   Highest education level: Not on file  Occupational History   Occupation: Retired    Fish farm manager: RETIRED   Occupation: Certified Nursing Assistance  Tobacco Use   Smoking status: Never   Smokeless tobacco: Never  Vaping Use   Vaping Use: Never used  Substance and Sexual Activity   Alcohol use: No    Alcohol/week: 0.0 standard drinks of alcohol   Drug use: No   Sexual activity: Not on file  Other Topics Concern   Not on file  Social History Narrative    Social History 07/09/2017        Patient is hard of hearing, uses a 4 leg cane or walker, on wait list for meals on wheels (12/29/19)   Who lives at home: Patient lives alone in one level home; son lives across the street     Transportation: son and family transport to appointments      Important Relationships Son, Friends Jerlyn Ly and Kalman Shan 07/09/2017    Pets: None 07/09/2017   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 Silvano Rusk, RN, BSN        Casimer Lanius, LCSW   Clinical Social Worker   Updated 12/29/2019  Tobacco use, amount per day now: No   Past tobacco use, amount per day: No   How many years did you use tobacco: No   Alcohol use (drinks per week): No   Diet: No   Do you drink/eat things with caffeine: Yes, sometimes.   Marital status: Widow                                  What year were you married?    Do you live in a house, apartment, assisted living, condo, trailer, etc.? House   Is it one or more stories? One.   How many persons live in your home? 1   Do you have pets in your home?( please list) No   Highest Level of education completed?   Current or past profession: Actuary.   Do you exercise? No                                 Type and how often?   Do you have a living will? Yes.   Do you have a DNR form? No                                  If not, do you want to discuss one?   Do you have signed POA/HPOA forms?                        If so, please bring to you appointment      Do you have any difficulty bathing or dressing yourself? No.   Do you have any difficulty preparing food or eating? No.   Do you have any difficulty managing your medications? Yes.   Do you have any difficulty managing your finances? No.   Do you have any difficulty affording your medications?    Social Determinants of Health   Financial Resource Strain: Not on file  Food Insecurity: Not on file  Transportation Needs: Not on file  Physical Activity: Not on file  Stress: Not on file  Social Connections: Not on file    Tobacco Counseling Counseling given: Not Answered   Clinical Intake:  Pre-visit preparation completed: No  Pain : 0-10 Pain Score: 10-Worst  pain ever Pain Type: Other (Comment) (wound pain) Pain Location: Ankle Pain Orientation: Left Pain Radiating Towards: no Pain Descriptors / Indicators: Aching Pain Onset: More than a month ago Pain Frequency: Constant Pain Relieving Factors: Analgesic Effect of Pain on Daily Activities: walking  Pain Relieving Factors: Analgesic  BMI - recorded: 22.99 Nutritional Status: BMI of 19-24  Normal Nutritional Risks: Other (Comment) (left ankle wound) Diabetes: No  How often do you need to have someone help you when you read instructions, pamphlets, or other written materials from your doctor or pharmacy?: 1 - Never What is the last grade level you completed in school?: 12 grade  Diabetic?No   Interpreter Needed?: No      Activities of Daily Living    08/21/2022    2:25 PM 11/11/2021    5:33 PM  In your present state of health, do you have any difficulty performing the following activities:  Hearing? 1 1  Vision? 0 1  Difficulty concentrating or making decisions? 1 1  Walking or climbing stairs? 1 1  Dressing or bathing? 1 1  Comment neeeds assistance   Doing errands, shopping? 1 0  Comment son Diplomatic Services operational officer and eating ? Y   Comment son assist   Using the Toilet? N   In the past six months, have you accidently leaked urine? Y   Do you have problems with loss of bowel control? N   Managing your Medications? Y   Comment son assist   Managing your Finances? Y   Comment son assist   Housekeeping or managing your Housekeeping? Y   Comment son assist     Patient Care Team: Tyrese Capriotti, Nelda Bucks, NP as PCP - General (Family Medicine) Josue Hector, MD as PCP - Cardiology (Cardiology) Peggye Form, MD as Referring Physician (Internal Medicine) Garald Balding, MD as Attending Physician (Orthopedic Surgery) Debbra Riding, MD as Consulting Physician (Ophthalmology) Noralee Space, MD as Consulting Physician (Pulmonary Disease)  Indicate any recent  Medical Services you may have received from other than [facility] providers in the past year (date may be approximate).     Assessment:   This is a routine wellness examination for Kalyse.  Hearing/Vision screen Hearing Screening - Comments:: Some hearing concerns. Patient doesn't wear hearing-aids.  Vision Screening - Comments:: No vision concerns. Patient last eye exam years ago. Patient has prescription glasses but doesn't wear them.  Dietary issues and exercise activities discussed: Current Exercise Habits: The patient does not participate in regular exercise at present, Exercise limited by: orthopedic condition(s) (leg pain)   Goals Addressed             This Visit's Progress    Keep walking 10 minutes daily   Not on track      Depression Screen    08/21/2022    1:56 PM 08/02/2021    2:04 PM 05/17/2020    2:44 PM 04/30/2020    3:19 PM 02/22/2020    2:24 PM 01/12/2020    3:18 PM 08/04/2019    3:57 PM  PHQ 2/9 Scores  PHQ - 2 Score 0 0 0 0 0 0 0    Fall Risk    08/21/2022    1:56 PM 07/08/2022    2:05 PM 05/29/2022    3:07 PM 01/22/2022    1:08 PM 08/19/2021    2:29 PM  [place] in the past year? 0 1 0 1 0  Number falls in past yr: 0 0 0 0 0  Injury with Fall? 0 0 0 1 0  Risk for fall due to : No Fall Risks History of fall(s);Impaired balance/gait;Impaired mobility No Fall Risks History of fall(s) No Fall Risks  Follow up Falls evaluation completed Falls evaluation completed;Education provided;Falls prevention discussed Falls evaluation completed Falls evaluation completed;Education provided;Falls prevention discussed Falls evaluation completed    FALL RISK PREVENTION PERTAINING TO THE HOME:  Any stairs in or around the home? Yes  If so, are there any without handrails? No  Home free of loose throw rugs in walkways, pet beds, electrical cords, etc? No  Adequate lighting in your home to reduce risk of falls? Yes   ASSISTIVE DEVICES UTILIZED TO PREVENT  FALLS:  Life alert? No  Use of a cane, walker or w/c? Yes  Grab bars in the bathroom? Yes  Shower chair or bench in shower? Yes  Elevated toilet seat or a handicapped toilet? Yes   TIMED UP AND GO:  Was the test performed? No .  Length of time  to ambulate 10 feet: N/A  sec.   Gait slow and steady with assistive device  Cognitive Function:    08/21/2022    1:57 PM 09/30/2016    3:52 PM  MMSE - Mini Mental State Exam  Orientation to time 3 5  Orientation to time comments year 2023, season winter, date 5ht, day thursday, month november.   Orientation to Place 2 5  Orientation to Place-comments state N/A, county [place], city [place], facility N/a, provider Dr.Dashi   Registration 3 3  Registration-comments apple, table, penny.   Attention/ Calculation 0 0  Recall 1 0  Recall-comments apple, spinach,   Language- name 2 objects 0 2  Language- name 2 objects-comments patient called my pen a pencil.   Language- repeat 0 1  Language- follow 3 step command 0 3  Language- read & follow direction 0 1  Write a sentence 0 1  Copy design 0 1  Total score 9 22        08/02/2021    2:07 PM  6CIT Screen  What Year? 4 points  What month? 0 points  What time? 0 points  Count back from 20 4 points  Months in reverse 4 points  Repeat phrase 10 points  Total Score 22 points    Immunizations Immunization History  Administered Date(s) Administered   Fluad Quad(high Dose 65+) 08/19/2021   Influenza, High Dose Seasonal PF 08/21/2015   Influenza,inj,Quad PF,6+ Mos 09/18/2015   PFIZER(Purple Top)SARS-COV-2 Vaccination 04/18/2020, 05/10/2020   Pneumococcal Polysaccharide-23 02/07/2015    TDAP status: Due, Education has been provided regarding the importance of this vaccine. Advised may receive this vaccine at local pharmacy or Health Dept. Aware to provide a copy of the vaccination record if obtained from local pharmacy or Health Dept. Verbalized acceptance and  understanding.  Flu Vaccine status: Due, Education has been provided regarding the importance of this vaccine. Advised may receive this vaccine at local pharmacy or Health Dept. Aware to provide a copy of the vaccination record if obtained from local pharmacy or Health Dept. Verbalized acceptance and understanding.  Pneumococcal vaccine status: Due, Education has been provided regarding the importance of this vaccine. Advised may receive this vaccine at local pharmacy or Health Dept. Aware to provide a copy of the vaccination record if obtained from local pharmacy or Health Dept. Verbalized acceptance and understanding.  Covid-19 vaccine status: Information provided on how to obtain vaccines.   Qualifies for Shingles Vaccine? Yes   Zostavax completed No   Shingrix Completed?: No.    Education has been provided regarding the importance of this vaccine. Patient has been advised to call insurance company to determine out of pocket expense if they have not yet received this vaccine. Advised may also receive vaccine at local pharmacy or Health Dept. Verbalized acceptance and understanding.  Screening Tests Health Maintenance  Topic Date Due   TETANUS/TDAP  Never done   Zoster Vaccines- Shingrix (1 of 2) Never done   Pneumonia Vaccine 81+ Years old (2 - PCV) 02/07/2016   COVID-19 Vaccine (3 - Pfizer risk series) 06/07/2020   INFLUENZA VACCINE  06/17/2022   DEXA SCAN  Completed   HPV VACCINES  Aged Out    Health Maintenance  Health Maintenance Due  Topic Date Due   TETANUS/TDAP  Never done   Zoster Vaccines- Shingrix (1 of 2) Never done   Pneumonia Vaccine 51+ Years old (2 - PCV) 02/07/2016   COVID-19 Vaccine (3 - Pfizer risk series) 06/07/2020   INFLUENZA  VACCINE  06/17/2022    Colorectal cancer screening: No longer required.   Mammogram status: No longer required due to due to advance age .  Bone Density status: Completed 03/05/2017. Results reflect: Bone density results:  OSTEOPOROSIS. Repeat every N/A years.  Lung Cancer Screening: (Low Dose CT Chest recommended if Age 35-80 years, 30 pack-year currently smoking OR have quit w/in 15years.) does not qualify.   Lung Cancer Screening Referral: No   Additional Screening:  Hepatitis C Screening: does not qualify; Completed No   Vision Screening: Recommended annual ophthalmology exams for early detection of glaucoma and other disorders of the eye. Is the patient up to date with their annual eye exam?  Yes  Who is the provider or what is the name of the office in which the patient attends annual eye exams? Dr.Groat  If pt is not established with a provider, would they like to be referred to a provider to establish care? No .   Dental Screening: Recommended annual dental exams for proper oral hygiene  Community Resource Referral / Chronic Care Management: CRR required this visit?  No   CCM required this visit?  No      Plan:     I have personally reviewed and noted the following in the patient's chart:   Medical and social history Use of alcohol, tobacco or illicit drugs  Current medications and supplements including opioid prescriptions. Patient is not currently taking opioid prescriptions. Functional ability and status Nutritional status Physical activity Advanced directives List of other physicians Hospitalizations, surgeries, and ER visits in previous 12 months Vitals Screenings to include cognitive, depression, and falls Referrals and appointments  In addition, I have reviewed and discussed with patient certain preventive protocols, quality metrics, and best practice recommendations. A written personalized care plan for preventive services as well as general preventive health recommendations were provided to patient.     Sandrea Hughs, NP   08/21/2022   Nurse Notes: Due for Tdap,Zooster vaccine,PNA,COVID-19 and Influenza vaccine

## 2022-08-21 NOTE — Patient Instructions (Signed)
Heather [name] , Thank you for taking time to come for your Medicare Wellness Visit. I appreciate your ongoing commitment to your health goals. Please review the following plan we discussed and let me know if I can assist you in the future.   Screening recommendations/referrals: Colonoscopy : N/A  Mammogram : N/A  Bone Density N/A  Recommended yearly ophthalmology/optometry visit for glaucoma screening and checkup Recommended yearly dental visit for hygiene and checkup  Vaccinations: Influenza vaccine- due annually in September/October Pneumococcal vaccine Due  Tdap vaccine Due  Shingles vaccine : Please get shingles vaccine at the Pharmacy     Advanced directives: Yes   Conditions/risks identified: Cardiac Risk Factors include: advanced age (>38mn, >>59women);hypertension;sedentary lifestyle  Next appointment: 1 year    Preventive Care 652Years and Older, Female Preventive care refers to lifestyle choices and visits with your health care provider that can promote health and wellness. What does preventive care include? A yearly physical exam. This is also called an annual well check. Dental exams once or twice a year. Routine eye exams. Ask your health care provider how often you should have your eyes checked. Personal lifestyle choices, including: Daily care of your teeth and gums. Regular physical activity. Eating a healthy diet. Avoiding tobacco and drug use. Limiting alcohol use. Practicing safe sex. Taking low-dose aspirin every day. Taking vitamin and mineral supplements as recommended by your health care provider. What happens during an annual well check? The services and screenings done by your health care provider during your annual well check will depend on your age, overall health, lifestyle risk factors, and family history of disease. Counseling  Your health care provider may ask you questions about your: Alcohol use. Tobacco use. Drug use. Emotional  well-being. Home and relationship well-being. Sexual activity. Eating habits. History of falls. Memory and ability to understand (cognition). Work and work [employer] Reproductive health. Screening  You may have the following tests or measurements: Height, weight, and BMI. Blood pressure. Lipid and cholesterol levels. These may be checked every 5 years, or more frequently if you are over 556years old. Skin check. Lung cancer screening. You may have this screening every year starting at age 8765if you have a 30-pack-year history of smoking and currently smoke or have quit within the past 15 years. Fecal occult blood test (FOBT) of the stool. You may have this test every year starting at age 86 Flexible sigmoidoscopy or colonoscopy. You may have a sigmoidoscopy every 5 years or a colonoscopy every 10 years starting at age 86 Hepatitis C blood test. Hepatitis B blood test. Sexually transmitted disease (STD) testing. Diabetes screening. This is done by checking your blood sugar (glucose) after you have not eaten for a while (fasting). You may have this done every 1-3 years. Bone density scan. This is done to screen for osteoporosis. You may have this done starting at age 86 Mammogram. This may be done every 1-2 years. Talk to your health care provider about how often you should have regular mammograms. Talk with your health care provider about your test results, treatment options, and if necessary, the need for more tests. Vaccines  Your health care provider may recommend certain vaccines, such as: Influenza vaccine. This is recommended every year. Tetanus, diphtheria, and acellular pertussis (Tdap, Td) vaccine. You may need a Td booster every 10 years. Zoster vaccine. You may need this after age 86 Pneumococcal 13-valent conjugate (PCV13) vaccine. One dose is recommended after age 86 Pneumococcal polysaccharide (PPSV23) vaccine.  One dose is recommended after age 25. Talk to your  health care provider about which screenings and vaccines you need and how often you need them. This information is not intended to replace advice given to you by your health care provider. Make sure you discuss any questions you have with your health care provider. Document Released: 11/30/2015 Document Revised: 07/23/2016 Document Reviewed: 09/04/2015 Elsevier Interactive Patient Education  2017 [place] Prevention in the Home Falls can cause injuries. They can happen to people of all ages. There are many things you can do to make your home safe and to help prevent falls. What can I do on the outside of my home? Regularly fix the edges of walkways and driveways and fix any cracks. Remove anything that might make you trip as you walk through a door, such as a raised step or threshold. Trim any bushes or trees on the path to your home. Use bright outdoor lighting. Clear any walking paths of anything that might make someone trip, such as rocks or tools. Regularly check to see if handrails are loose or broken. Make sure that both sides of any steps have handrails. Any raised decks and porches should have guardrails on the edges. Have any leaves, snow, or ice cleared regularly. Use sand or salt on walking paths during winter. Clean up any spills in your garage right away. This includes oil or grease spills. What can I do in the bathroom? Use night lights. Install grab bars by the toilet and in the tub and shower. Do not use towel bars as grab bars. Use non-skid mats or decals in the tub or shower. If you need to sit down in the shower, use a plastic, non-slip stool. Keep the floor dry. Clean up any water that spills on the floor as soon as it happens. Remove soap buildup in the tub or shower regularly. Attach bath mats securely with double-sided non-slip rug tape. Do not have throw rugs and other things on the floor that can make you trip. What can I do in the bedroom? Use night  lights. Make sure that you have a light by your bed that is easy to reach. Do not use any sheets or blankets that are too big for your bed. They should not hang down onto the floor. Have a firm chair that has side arms. You can use this for support while you get dressed. Do not have throw rugs and other things on the floor that can make you trip. What can I do in the kitchen? Clean up any spills right away. Avoid walking on wet floors. Keep items that you use a lot in easy-to-reach places. If you need to reach something above you, use a strong step stool that has a grab bar. Keep electrical cords out of the way. Do not use floor polish or wax that makes floors slippery. If you must use wax, use non-skid floor wax. Do not have throw rugs and other things on the floor that can make you trip. What can I do with my stairs? Do not leave any items on the stairs. Make sure that there are handrails on both sides of the stairs and use them. Fix handrails that are broken or loose. Make sure that handrails are as long as the stairways. Check any carpeting to make sure that it is firmly attached to the stairs. Fix any carpet that is loose or worn. Avoid having throw rugs at the top or bottom of  the stairs. If you do have throw rugs, attach them to the floor with carpet tape. Make sure that you have a light switch at the top of the stairs and the bottom of the stairs. If you do not have them, ask someone to add them for you. What else can I do to help prevent falls? Wear shoes that: Do not have high heels. Have rubber bottoms. Are comfortable and fit you well. Are closed at the toe. Do not wear sandals. If you use a stepladder: Make sure that it is fully opened. Do not climb a closed stepladder. Make sure that both sides of the stepladder are locked into place. Ask someone to hold it for you, if possible. Clearly mark and make sure that you can see: Any grab bars or handrails. First and last  steps. Where the edge of each step is. Use tools that help you move around (mobility aids) if they are needed. These include: Canes. Walkers. Scooters. Crutches. Turn on the lights when you go into a dark area. Replace any light bulbs as soon as they burn out. Set up your furniture so you have a clear path. Avoid moving your furniture around. If any of your floors are uneven, fix them. If there are any pets around you, be aware of where they are. Review your medicines with your doctor. Some medicines can make you feel dizzy. This can increase your chance of falling. Ask your doctor what other things that you can do to help prevent falls. This information is not intended to replace advice given to you by your health care provider. Make sure you discuss any questions you have with your health care provider. Document Released: 08/30/2009 Document Revised: 04/10/2016 Document Reviewed: 12/08/2014 Elsevier Interactive Patient Education  2017 [employer].

## 2022-09-02 ENCOUNTER — Ambulatory Visit (INDEPENDENT_AMBULATORY_CARE_PROVIDER_SITE_OTHER): Payer: Medicare Other | Admitting: Adult Health

## 2022-09-02 ENCOUNTER — Encounter: Payer: Self-pay | Admitting: Adult Health

## 2022-09-02 VITALS — BP 110/60 | HR 68 | Temp 95.4°F | Ht <= 58 in

## 2022-09-02 DIAGNOSIS — L03116 Cellulitis of left lower limb: Secondary | ICD-10-CM | POA: Diagnosis not present

## 2022-09-02 MED ORDER — DOXYCYCLINE HYCLATE 100 MG PO TABS: 100.0000 mg | ORAL_TABLET | Freq: Two times a day (BID) | ORAL | 0 refills | Status: AC

## 2022-09-02 MED ORDER — SACCHAROMYCES BOULARDII 250 MG PO CAPS: 250.0000 mg | ORAL_CAPSULE | Freq: Two times a day (BID) | ORAL | 0 refills | Status: AC

## 2022-09-02 NOTE — Patient Instructions (Signed)
Cellulitis, Adult  Cellulitis is a skin infection. The infected area is often warm, red, swollen, and sore. It occurs most often in the arms and lower legs. It is very important to get treated for this condition. What are the causes? This condition is caused by bacteria. The bacteria enter through a break in the skin, such as a cut, burn, insect bite, open sore, or crack. What increases the risk? This condition is more likely to occur in people who: Have a weak body defense system (immune system). Have open cuts, burns, bites, or scrapes on the skin. Are older than 86 years of age. Have a blood sugar problem (diabetes). Have a long-lasting (chronic) liver disease (cirrhosis) or kidney disease. Are very overweight (obese). Have a skin problem, such as: Itchy rash (eczema). Slow movement of blood in the veins (venous stasis). Fluid buildup below the skin (edema). Have been treated with high-energy rays (radiation). Use IV drugs. What are the signs or symptoms? Symptoms of this condition include: Skin that is: Red. Streaking. Spotting. Swollen. Sore or painful when you touch it. Warm. A fever. Chills. Blisters. How is this diagnosed? This condition is diagnosed based on: Medical history. Physical exam. Blood tests. Imaging tests. How is this treated? Treatment for this condition may include: Medicines to treat infections or allergies. Home care, such as: Rest. Placing cold or warm cloths (compresses) on the skin. Hospital care, if the condition is very bad. Follow these instructions at home: Medicines Take over-the-counter and prescription medicines only as told by your doctor. If you were prescribed an antibiotic medicine, take it as told by your doctor. Do not stop taking it even if you start to feel better. General instructions  Drink enough fluid to keep your pee (urine) pale yellow. Do not touch or rub the infected area. Raise (elevate) the infected area above  the level of your heart while you are sitting or lying down. Place cold or warm cloths on the area as told by your doctor. Keep all follow-up visits as told by your doctor. This is important. Contact a doctor if: You have a fever. You do not start to get better after 1-2 days of treatment. Your bone or joint under the infected area starts to hurt after the skin has healed. Your infection comes back. This can happen in the same area or another area. You have a swollen bump in the area. You have new symptoms. You feel ill and have muscle aches and pains. Get help right away if: Your symptoms get worse. You feel very sleepy. You throw up (vomit) or have watery poop (diarrhea) for a long time. You see red streaks coming from the area. Your red area gets larger. Your red area turns dark in color. These symptoms may represent a serious problem that is an emergency. Do not wait to see if the symptoms will go away. Get medical help right away. Call your local emergency services (911 in the [place] Do not drive yourself to the hospital. Summary Cellulitis is a skin infection. The area is often warm, red, swollen, and sore. This condition is treated with medicines, rest, and cold and warm cloths. Take all medicines only as told by your doctor. Tell your doctor if symptoms do not start to get better after 1-2 days of treatment. This information is not intended to replace advice given to you by your health care provider. Make sure you discuss any questions you have with your health care provider. Document Revised: 08/15/2021 Document   Reviewed: 08/15/2021 Elsevier Patient Education  2023 Elsevier Inc.  

## 2022-09-02 NOTE — Progress Notes (Signed)
[facility]  Provider:   Durenda Age DNP  Code Status:   Full Code  Goals of Care:     07/08/2022    2:06 PM  Advanced Directives  Does Patient Have a Medical Advance Directive? Yes  Type of Paramedic of [place];Living will;Out of facility DNR (pink MOST or yellow form)  Does patient want to make changes to medical advance directive? No - Patient declined  Copy of [place] in Chart? No - copy requested     Chief Complaint  Patient presents with   Acute Visit    HPI: Patient is a 88 y.o. female seen today for an acute visit for Left lower leg and foot wound. She has the wound X 4 months. She was accompanied today by her son, Ronalee Belts. The wound is superficial and erythematous. No reported fever nor chills. Home health nurse comes to do wound treatment on Mondays and Thursdays.  Past Medical History:  Diagnosis Date   Abdominal pain, left lower quadrant 06/22/2013   Abnormal CT scan of lung 07/16/2015   Allergic rhinitis, cause unspecified 02/21/2014   Anxiety state, unspecified    Breast cancer ([place])    NO STICK OR BLOOD PRESSURE CHECKS IN LEFT ARM   Carpal tunnel syndrome    Carpal tunnel syndrome on both sides 10/03/2008   Qualifier: Diagnosis of  By: Lenna Gilford MD, Deborra Medina    Cervical spondylosis    Chronic pain syndrome 03/03/2019   Colon polyp 12/19/2009   Transverse-polypoid colorectal mucosa   Constipation    Cough 10/21/2014   Degenerative joint disease    Diastolic dysfunction    Diverticula of colon    Diverticulosis of colon 05/24/2002   Qualifier: Diagnosis of  By: Jerral Ralph     Gastritis, chronic    Hiatal hernia    Hypertension    Low back pain    LPRD (laryngopharyngeal reflux disease) 04/04/2014   Osteoporosis 05/22/2020   RBBB (right bundle branch block with left anterior fascicular block)    Renal cyst    Venous insufficiency     Past Surgical History:  Procedure Laterality Date   CARPAL  TUNNEL RELEASE     INTRAMEDULLARY (IM) NAIL INTERTROCHANTERIC Right 11/13/2021   Procedure: INTRAMEDULLARY (IM) NAIL INTERTROCHANTRIC;  Surgeon: Vanetta Mulders, MD;  Location: [facility];  Service: Orthopedics;  Laterality: Right;   Left mastectomy      Allergies  Allergen Reactions   Fentanyl Nausea Only and Other (See Comments)    Pt states that she is not allergic     Naprosyn [Naproxen] Other (See Comments)    Patient felt tongue and face swelling. Went to ED where no visible swelling noted    Outpatient Encounter Medications as of 09/02/2022  Medication Sig   acetaminophen (TYLENOL) 500 MG tablet Take 500 mg by mouth every 6 (six) hours as needed for moderate pain or headache.   carvedilol (COREG) 3.125 MG tablet Take 1 tablet (3.125 mg total) by mouth 2 (two) times daily. Hold for SBP <110 or HR <70   cholecalciferol (VITAMIN D) 1000 units tablet Take 1,000 Units by mouth daily.   doxycycline (VIBRA-TABS) 100 MG tablet Take 1 tablet (100 mg total) by mouth 2 (two) times daily for 14 days.   famotidine (PEPCID) 20 MG tablet Take 1 tablet (20 mg total) by mouth daily.   feeding supplement, ENSURE ENLIVE, (ENSURE ENLIVE) LIQD Take 237 mLs by mouth 2 (two) times daily between meals.  furosemide (LASIX) 40 MG tablet Take 40 mg by mouth daily.   levocetirizine (XYZAL) 5 MG tablet Take 5 mg by mouth as needed for allergies.   lidocaine (LIDODERM) 5 % Place 1 patch onto the skin as needed. Remove & Discard patch within 12 hours or as directed by MD   LORazepam (ATIVAN) 0.5 MG tablet Take 0.5 mg by mouth as needed.   Multiple Vitamins-Minerals (MULTIVITAMINS THER. W/MINERALS) TABS Take 1 tablet by mouth daily.   polyethylene glycol (MIRALAX / GLYCOLAX) 17 g packet Take 17 g by mouth daily.   Propylene Glycol (SYSTANE COMPLETE OP) Apply 1 drop to eye daily as needed (dry eyes).   saccharomyces boulardii (FLORASTOR) 250 MG capsule Take 1 capsule (250 mg total) by mouth 2 (two) times daily for  17 days.   sennosides-docusate sodium (SENOKOT-S) 8.6-50 MG tablet Take 1-2 tablets by mouth daily as needed for constipation.   spironolactone (ALDACTONE) 25 MG tablet Take 25 mg by mouth daily.   zinc oxide (BALMEX) 11.3 % CREA cream Apply 1 application topically 2 (two) times daily as needed.   [DISCONTINUED] saccharomyces boulardii (FLORASTOR) 250 MG capsule Take 1 capsule (250 mg total) by mouth daily. Patient to take one time daily by mouth for 10 days   No facility-administered encounter medications on file as of 09/02/2022.    Review of Systems:  Review of Systems  Constitutional:  Negative for appetite change, chills, fatigue and fever.  HENT:  Negative for congestion, hearing loss, rhinorrhea and sore throat.   Eyes: Negative.   Respiratory:  Negative for cough, shortness of breath and wheezing.   Cardiovascular:  Negative for chest pain, palpitations and leg swelling.  Gastrointestinal:  Negative for abdominal pain, constipation, diarrhea, nausea and vomiting.  Genitourinary:  Negative for dysuria.  Musculoskeletal:  Negative for arthralgias, back pain and myalgias.  Skin:  Positive for wound. Negative for color change and rash.  Neurological:  Negative for dizziness, weakness and headaches.  Psychiatric/Behavioral:  Negative for behavioral problems. The patient is not nervous/anxious.     Health Maintenance  Topic Date Due   COVID-19 Vaccine (3 - Pfizer risk series) 09/06/2022 (Originally 06/07/2020)   Zoster Vaccines- Shingrix (1 of 2) 11/21/2022 (Originally 06/13/1937)   INFLUENZA VACCINE  02/15/2023 (Originally 06/17/2022)   Pneumonia Vaccine 70+ Years old (2 - PCV) 08/22/2023 (Originally 02/07/2016)   TETANUS/TDAP  08/22/2023 (Originally 06/13/1937)   DEXA SCAN  Completed   HPV VACCINES  Aged Out    Physical Exam: Vitals:   09/02/22 1345  BP: 110/60  Pulse: 68  Temp: (!) 95.4 F (35.2 C)  SpO2: 98%  Height: '4\' 10"'$  (1.473 m)   Body mass index is 22.99  kg/m. Physical Exam Constitutional:      Appearance: Normal appearance.  HENT:     Head: Normocephalic and atraumatic.     Nose: Nose normal.     Mouth/Throat:     Mouth: Mucous membranes are moist.  Eyes:     Conjunctiva/sclera: Conjunctivae normal.  Cardiovascular:     Rate and Rhythm: Normal rate and regular rhythm.  Pulmonary:     Effort: Pulmonary effort is normal.     Breath sounds: Normal breath sounds.  Abdominal:     General: Bowel sounds are normal.     Palpations: Abdomen is soft.  Musculoskeletal:        General: Normal range of motion.     Cervical back: Normal range of motion.  Skin:    General: Skin  is warm and dry.     Comments: Left lower leg and foot skin with erythema, clear drainage, tender.  Neurological:     General: No focal deficit present.     Mental Status: She is alert and oriented to person, place, and time.  Psychiatric:        Mood and Affect: Mood normal.        Behavior: Behavior normal.        Thought Content: Thought content normal.        Judgment: Judgment normal.     Labs reviewed: Basic Metabolic Panel: Recent Labs    11/14/21 0359 11/16/21 0303 11/20/21 0810 01/22/22 1400  NA 144 142 137 141  K 3.8 4.1 4.5 4.9  CL 107 108 105 104  CO2 '27 30 27 29  '$ GLUCOSE 132* 110* 107* 89  BUN 25* 22 27* 30*  CREATININE 0.83 0.72 0.73 0.85  CALCIUM 8.2* 8.0* 8.4* 9.3  MG 2.4  --  2.1  --   PHOS  --   --  3.2  --    Liver Function Tests: Recent Labs    11/11/21 2115 11/12/21 0408  AST 48* 54*  ALT 28 28  ALKPHOS 40 41  BILITOT 1.3* 1.3*  PROT 6.7 6.7  ALBUMIN 3.7 3.4*   No results for input(s): "LIPASE", "AMYLASE" in the last 8760 hours. No results for input(s): "AMMONIA" in the last 8760 hours. CBC: Recent Labs    11/11/21 2115 11/12/21 0408 11/16/21 0303 11/20/21 0810 01/22/22 1400  WBC 9.9   < > 7.3 6.5 6.3  NEUTROABS 7.4  --   --   --  3,648  HGB 12.7   < > 8.9* 9.5* 11.5*  HCT 40.0   < > 27.4* 29.8* 35.9  MCV  97.3   < > 95.8 96.4 92.5  PLT 185   < > 166 376 331   < > = values in this interval not displayed.   Lipid Panel: No results for input(s): "CHOL", "HDL", "LDLCALC", "TRIG", "CHOLHDL", "LDLDIRECT" in the last 8760 hours. No results found for: "HGBA1C"  Procedures since last visit: No results found.  Assessment/Plan  1. Cellulitis of left lower extremity -  Cleansed wound with NS,  applied xeroform dressing then covered with non stick dressing and ace wrap - doxycycline (VIBRA-TABS) 100 MG tablet; Take 1 tablet (100 mg total) by mouth 2 (two) times daily for 14 days.  Dispense: 28 tablet; Refill: 0 - saccharomyces boulardii (FLORASTOR) 250 MG capsule; Take 1 capsule (250 mg total) by mouth 2 (two) times daily for 17 days.  Dispense: 34 capsule; Refill: 0 - AMB referral to wound care center    Labs/tests ordered:  None  Next appt:  Visit date not found

## 2022-09-08 ENCOUNTER — Encounter (HOSPITAL_BASED_OUTPATIENT_CLINIC_OR_DEPARTMENT_OTHER): Payer: Medicare Other | Attending: Internal Medicine | Admitting: Internal Medicine

## 2022-09-08 DIAGNOSIS — Z853 Personal history of malignant neoplasm of breast: Secondary | ICD-10-CM | POA: Diagnosis not present

## 2022-09-08 DIAGNOSIS — I110 Hypertensive heart disease with heart failure: Secondary | ICD-10-CM | POA: Insufficient documentation

## 2022-09-08 DIAGNOSIS — I5032 Chronic diastolic (congestive) heart failure: Secondary | ICD-10-CM | POA: Insufficient documentation

## 2022-09-08 DIAGNOSIS — I872 Venous insufficiency (chronic) (peripheral): Secondary | ICD-10-CM | POA: Diagnosis not present

## 2022-09-08 DIAGNOSIS — I10 Essential (primary) hypertension: Secondary | ICD-10-CM | POA: Diagnosis not present

## 2022-09-08 DIAGNOSIS — I87312 Chronic venous hypertension (idiopathic) with ulcer of left lower extremity: Secondary | ICD-10-CM | POA: Insufficient documentation

## 2022-09-08 DIAGNOSIS — L97822 Non-pressure chronic ulcer of other part of left lower leg with fat layer exposed: Secondary | ICD-10-CM | POA: Insufficient documentation

## 2022-09-08 DIAGNOSIS — F0390 Unspecified dementia without behavioral disturbance: Secondary | ICD-10-CM | POA: Insufficient documentation

## 2022-09-08 DIAGNOSIS — Z9012 Acquired absence of left breast and nipple: Secondary | ICD-10-CM | POA: Diagnosis not present

## 2022-09-08 NOTE — Progress Notes (Signed)
Heather [name], Heather [name] ([id number]) [id number] Page 1 of 4 Visit Report for 09/08/2022 Abuse Risk Screen Details Patient Name: Date of Service: Heather [name]. 09/08/2022 8:00 A M Medical Record Number: [id number] Patient Account Number: [id number] Date of Birth/Sex: Treating RN: Jan 15, 1918 (88 y.o. Heather [name] Primary Care Solash Tullo: Marlowe Sax Other Clinician: Referring Tilford Deaton: Treating Erric Machnik/Extender: Heather [name], Heather [name] in Treatment: 0 Abuse Risk Screen Items Answer ABUSE RISK SCREEN: Has anyone close to you tried to hurt or harm you recentlyo No Do you feel uncomfortable with anyone in your familyo No Has anyone forced you do things that you didnt want to doo No Electronic Signature(s) Signed: 09/08/2022 5:18:37 PM By: Deon Pilling RN, BSN Entered By: Deon Pilling on 09/08/2022 08:01:46 -------------------------------------------------------------------------------- Activities of Daily Living Details Patient Name: Date of Service: [place], [place]. 09/08/2022 8:00 A M Medical Record Number: [id number] Patient Account Number: [id number] Date of Birth/Sex: Treating RN: Dec 19, 1917 (88 y.o. Heather [name] Primary Care Irja Wheless: Marlowe Sax Other Clinician: Referring Jean Alejos: Treating Eleri [name]/Extender: Heather [name], Heather [name] in Treatment: 0 Activities of Daily Living Items Answer Activities of Daily Living (Please select one for each item) Drive Automobile Not Able T Medications ake Completely Able Use T elephone Completely Able Care for Appearance Need Assistance Use T oilet Need Assistance Bath / Shower Need Assistance Dress Self Need Assistance Feed Self Completely Able Walk Not Able Get In / Out Bed Need Assistance Housework Not Able Prepare Meals Not Able Handle Money Not Able Shop for Self Not Able Electronic Signature(s) Signed: 09/08/2022 5:18:37 PM By: Deon Pilling RN, BSN Entered By: Deon Pilling on 09/08/2022 08:02:30 Heather [name] ([id number]) [id number] Page 2 of 4 -------------------------------------------------------------------------------- Education Screening Details Patient Name: Date of Service: Heather [name] 09/08/2022 8:00 A M Medical Record Number: [id number] Patient Account Number: [id number] Date of Birth/Sex: Treating RN: [birth year] (88 y.o. Heather [name] Primary Care Deonte Otting: Marlowe Sax Other Clinician: Referring Jonanthony Nahar: Treating Elsa Ploch/Extender: Heather [name] in Treatment: 0 Primary Learner Assessed: Patient Learning Preferences/Education Level/Primary Language Learning Preference: Explanation, Demonstration, Printed Material Highest Education Level: High School Preferred Language: English Cognitive Barrier Language Barrier: No Translator Needed: No Memory Deficit: No Emotional Barrier: No Cultural/Religious Beliefs Affecting Medical Care: No Physical Barrier Impaired Vision: No Impaired Hearing: No Decreased Hand dexterity: No Knowledge/Comprehension Knowledge Level: High Comprehension Level: High Ability to understand written instructions: High Ability to understand verbal instructions: High Motivation Anxiety Level: Calm Cooperation: Cooperative Education Importance: Acknowledges Need Interest in Health Problems: Asks Questions Perception: Coherent Willingness to Engage in Self-Management High Activities: Readiness to Engage in Self-Management High Activities: Notes son cares for patient. Electronic Signature(s) Signed: 09/08/2022 5:18:37 PM By: Deon Pilling RN, BSN Entered By: Deon Pilling on 09/08/2022 08:04:01 -------------------------------------------------------------------------------- Fall Risk Assessment Details Patient Name: Date of Service: [name], DO [place] [name]. 09/08/2022 8:00 A M Medical Record  Number: [id number] Patient Account Number: [id number] Date of Birth/Sex: Treating RN: 1918-08-10 (88 y.o. Heather [name] Primary Care Heather [name]: Marlowe Sax Other Clinician: Referring Rhet Rorke: Treating Heather [name]: Heather [name], Heather [name] in Treatment: 0 Heather, [place] Cyndy [name] ([id number]) [id number] Page 3 of 4 Fall Risk Assessment Items Have you had 2 or more falls in the last 12 monthso 0 No Have you had any fall that resulted in injury in the last 12 monthso 0 No FALLS RISK SCREEN History of falling - immediate or within 3 months 0 No Secondary diagnosis (  Do you have 2 or more medical diagnoseso) 0 No Ambulatory aid None/bed rest/wheelchair/nurse 0 Yes Crutches/cane/walker 0 No Furniture 0 No Intravenous therapy Access/Saline/Heparin Lock 0 No Gait/Transferring Normal/ bed rest/ wheelchair 0 Yes Weak (short steps with or without shuffle, stooped but able to lift head while walking, may seek 0 No support from furniture) Impaired (short steps with shuffle, may have difficulty arising from chair, head down, impaired 20 Yes balance) Mental Status Oriented to own ability 0 No Electronic Signature(s) Signed: 09/08/2022 5:18:37 PM By: Deon Pilling RN, BSN Entered By: Deon Pilling on 09/08/2022 08:04:45 -------------------------------------------------------------------------------- Foot Assessment Details Patient Name: Date of Service: [name], [name] 09/08/2022 8:00 A M Medical Record Number: [id number] Patient Account Number: [id number] Date of Birth/Sex: Treating RN: 08-04-1918 (88 y.o. Heather [name] Primary Care Maniah Nading: Marlowe Sax Other Clinician: Referring Reginold Beale: Treating Jaidence Geisler/Extender: Heather [name], Heather [name] in Treatment: 0 Foot Assessment Items Site Locations + = Sensation present, - = Sensation absent, C = Callus, U = Ulcer R = Redness, W = Warmth, M = Maceration, PU =  Pre-ulcerative lesion F = Fissure, S = Swelling, D = Dryness Assessment Right: Left: Other Deformity: No No Prior Foot Ulcer: No No Prior Amputation: No No Charcot Joint: No No Heather [name] ([id number]) [phone] [id number] Page 4 of 4 Ambulatory Status: Non-ambulatory Assistance Device: Wheelchair Gait: Administrator, arts) Signed: 09/08/2022 5:18:37 PM By: Deon Pilling RN, BSN Entered By: Deon Pilling on 09/08/2022 08:15:07 -------------------------------------------------------------------------------- Nutrition Risk Screening Details Patient Name: Date of Service: [place], DO [place] [name]. 09/08/2022 8:00 A M Medical Record Number: [id number] Patient Account Number: [id number] Date of Birth/Sex: Treating RN: 1918/04/08 (88 y.o. Heather [name] Primary Care Ivionna Verley: Marlowe Sax Other Clinician: Referring Skarlet Lyons: Treating Jhovani Griswold/Extender: Heather [name], Heather [name] in Treatment: 0 Height (in): 59 Weight (lbs): 100 Body Mass Index (BMI): 20.2 Nutrition Risk Screening Items Score Screening NUTRITION RISK SCREEN: I have an illness or condition that made me change the kind and/or amount of food I eat 2 Yes I eat fewer than two meals per day 0 No I eat few fruits and vegetables, or milk products 0 No I have three or more drinks of beer, liquor or wine almost every day 0 No I have tooth or mouth problems that make it hard for me to eat 0 No I don't always have enough money to buy the food I need 0 No I eat alone most of the time 0 No I take three or more different prescribed or over-the-counter drugs a day 1 Yes Without wanting to, I have lost or gained 10 pounds in the last six months 0 No I am not always physically able to shop, cook and/or feed myself 0 No Nutrition Protocols Good Risk Protocol Moderate Risk Protocol 0 Provide education on nutrition High Risk Proctocol Risk Level: Moderate Risk Score:  3 Electronic Signature(s) Signed: 09/08/2022 5:18:37 PM By: Deon Pilling RN, BSN Entered By: Deon Pilling on 09/08/2022 08:04:15

## 2022-09-11 NOTE — Progress Notes (Signed)
PAYSEN, GOZA ([id number]) [id number] Page 1 of 10 Visit Report for 09/08/2022 Allergy List Details Patient Name: Date of Service: Heather [name]. 09/08/2022 8:00 A M Medical Record Number: [id number] Patient Account Number: [id number] Date of Birth/Sex: Treating RN: 11-09-1918 (88 y.o. [name] Primary Care Beverley Allender: Marlowe Sax Other Clinician: Referring Jervis Trapani: Treating Joziah Dollins/Extender: Kalman Shan Ngetich, Dinah Weeks in Treatment: 0 Allergies Active Allergies fentanyl Reaction: N/V naproxen Reaction: hypersensitivity Allergy Notes Electronic Signature(s) Signed: 09/08/2022 5:18:37 PM By: Deon Pilling RN, BSN Entered By: Deon Pilling on 09/05/2022 16:44:59 -------------------------------------------------------------------------------- Arrival Information Details Patient Name: Date of Service: [place], Heather [place] J. 09/08/2022 8:00 A M Medical Record Number: [id number] Patient Account Number: [id number] Date of Birth/Sex: Treating RN: Oct 12, 1918 (88 y.o. [name] Primary Care Noor Vidales: Marlowe Sax Other Clinician: Referring Kavian Peters: Treating Cambria Osten/Extender: Elsworth Soho in Treatment: 0 Visit Information Patient Arrived: Wheel Chair Arrival Time: 07:56 Accompanied By: son Transfer Assistance: Manual Patient Identification Verified: Yes Secondary Verification Process Completed: Yes Patient Requires Transmission-Based Precautions: No Patient Has Alerts: No Electronic Signature(s) Signed: 09/08/2022 5:18:37 PM By: Deon Pilling RN, BSN Entered By: Deon Pilling on 09/08/2022 08:01:14 Heather [name] ([id number]) [id number] Page 2 of 10 -------------------------------------------------------------------------------- Clinic Level of Care Assessment Details Patient Name: Date of Service: Heather [name] 09/08/2022 8:00 A M Medical Record  Number: [id number] Patient Account Number: [id number] Date of Birth/Sex: Treating RN: 08/24/1918 (88 y.o. Tonita Phoenix, Lauren Primary Care Matia Zelada: Marlowe Sax Other Clinician: Referring Lyden Redner: Treating Parisa Pinela/Extender: Kalman Shan Ngetich, Dinah Weeks in Treatment: 0 Clinic Level of Care Assessment Items TOOL 3 Quantity Score X- 1 0 Use when EandM and Procedure is performed on FOLLOW-UP visit ASSESSMENTS - Nursing Assessment / Reassessment X- 1 10 Reassessment of Co-morbidities (includes updates in patient status) X- 1 5 Reassessment of Adherence to Treatment Plan ASSESSMENTS - Wound and Skin Assessment / Reassessment '[]'$  - Points for Wound Assessment can only be taken for a new wound of unknown or different etiology and a procedure is 0 NOT performed to that wound X- 1 5 Simple Wound Assessment / Reassessment - one wound '[]'$  - 0 Complex Wound Assessment / Reassessment - multiple wounds '[]'$  - 0 Dermatologic / Skin Assessment (not related to wound area) ASSESSMENTS - Focused Assessment X- 1 5 Circumferential Edema Measurements - multi extremities '[]'$  - 0 Nutritional Assessment / Counseling / Intervention '[]'$  - 0 Lower Extremity Assessment (monofilament, tuning fork, pulses) '[]'$  - 0 Peripheral Arterial Disease Assessment (using hand held doppler) ASSESSMENTS - Ostomy and/or Continence Assessment and Care '[]'$  - 0 Incontinence Assessment and Management '[]'$  - 0 Ostomy Care Assessment and Management (repouching, etc.) PROCESS - Coordination of Care '[]'$  - Points for Discharge Coordination can only be taken for a new wound of unknown or different etiology and a procedure 0 is NOT performed to that wound '[]'$  - 0 Simple Patient / Family Education for ongoing care X- 1 20 Complex (extensive) Patient / Family Education for ongoing care X- 1 10 Staff obtains Programmer, systems, Records, T Results / Process Orders est X- 1 10 Staff telephones HHA, Nursing Homes / Clarify orders /  etc '[]'$  - 0 Routine Transfer to another Facility (non-emergent condition) '[]'$  - 0 Routine Hospital Admission (non-emergent condition) X- 1 15 New Admissions / Biomedical engineer / Ordering NPWT Apligraf, etc. , '[]'$  - 0 Emergency Hospital Admission (emergent condition) '[]'$  - 0 Simple Discharge Coordination X- 1 15 Complex (extensive) Discharge Coordination PROCESS -  Special Needs '[]'$  - 0 Pediatric / Minor Patient Management '[]'$  - 0 Isolation Patient Management '[]'$  - 0 Hearing / Language / Visual special needs '[]'$  - 0 Assessment of Community assistance (transportation, D/C planning, etc.) '[]'$  - 0 Additional assistance / Altered mentation '[]'$  - 0 Support Surface(s) Assessment (bed, cushion, seat, etc.) INTERVENTIONS - Wound Cleansing / Measurement Heather [name], Heather [name] ([id number]) [id number] Page 3 of 10 '[]'$  - Points for Wound Cleaning / Measurement, Wound Dressing, Specimen Collection and Specimen taken to lab can only 0 be taken for a new wound of unknown or different etiology and a procedure is NOT performed to that wound X- 1 5 Simple Wound Cleansing - one wound '[]'$  - 0 Complex Wound Cleansing - multiple wounds X- 1 5 Wound Imaging (photographs - any number of wounds) '[]'$  - 0 Wound Tracing (instead of photographs) X- 1 5 Simple Wound Measurement - one wound '[]'$  - 0 Complex Wound Measurement - multiple wounds INTERVENTIONS - Wound Dressings '[]'$  - 0 Small Wound Dressing one or multiple wounds X- 1 15 Medium Wound Dressing one or multiple wounds '[]'$  - 0 Large Wound Dressing one or multiple wounds INTERVENTIONS - Miscellaneous '[]'$  - 0 External ear exam X- 1 5 Specimen Collection (cultures, biopsies, blood, body fluids, etc.) X- 1 5 Specimen(s) / Culture(s) sent or taken to Lab for analysis '[]'$  - 0 Patient Transfer (multiple staff / Harrel Lemon Lift / Similar devices) '[]'$  - 0 Simple Staple / Suture removal (25 or less) '[]'$  - 0 Complex Staple / Suture  removal (26 or more) '[]'$  - 0 Hypo / Hyperglycemic Management (close monitor of Blood Glucose) X- 1 15 Ankle / Brachial Index (ABI) - Heather not check if billed separately X- 1 5 Vital Signs Has the patient been seen at the hospital within the last three years: Yes Total Score: 155 Level Of Care: New/Established - Level 4 Electronic Signature(s) Signed: 09/11/2022 4:44:37 PM By: Rhae Hammock RN Entered By: Rhae Hammock on 09/08/2022 09:27:33 -------------------------------------------------------------------------------- Encounter Discharge Information Details Patient Name: Date of Service: Heather [name], Heather RETHEA J. 09/08/2022 8:00 A M Medical Record Number: [id number] Patient Account Number: [id number] Date of Birth/Sex: Treating RN: 1918-06-28 (88 y.o. [name] Primary Care Brelynn Wheller: Marlowe Sax Other Clinician: Referring Zehava Turski: Treating Liberta Gimpel/Extender: Kalman Shan Ngetich, Dinah Weeks in Treatment: 0 Encounter Discharge Information Items Post Procedure Vitals Discharge Condition: Stable Temperature (F): 98.7 Ambulatory Status: Wheelchair Pulse (bpm): 74 Discharge Destination: Home Respiratory Rate (breaths/min): 17 Transportation: Private Auto Blood Pressure (mmHg): 110/70 Accompanied By: SON Schedule Follow-up Appointment: Yes Clinical Summary of Care: Patient Declined Electronic Signature(s) Signed: 09/11/2022 4:44:37 PM By: Rhae Hammock RN Entered By: Rhae Hammock on 09/08/2022 09:28:15 Sobieski, Heather [name] ([id number]) [id number] Page 4 of 10 -------------------------------------------------------------------------------- Lower Extremity Assessment Details Patient Name: Date of Service: Heather [name]. 09/08/2022 8:00 A M Medical Record Number: [id number] Patient Account Number: [id number] Date of Birth/Sex: Treating RN: 1918/08/29 (88 y.o. [name], [name] Primary Care Isley Weisheit: Marlowe Sax  Other Clinician: Referring Sirinity Outland: Treating Gennavieve Huq/Extender: Kalman Shan Ngetich, Dinah Weeks in Treatment: 0 Edema Assessment Assessed: [Left: Yes] [Right: No] Edema: [Left: Ye] [Right: s] Calf Left: Right: Point of Measurement: 32 cm From Medial Instep 32 cm Ankle Left: Right: Point of Measurement: 10 cm From Medial Instep 24 cm Knee To Floor Left: Right: From Medial Instep 43 cm Vascular Assessment Pulses: Dorsalis Pedis Palpable: [Left:Yes] Doppler Audible: [Left:Yes] Posterior Tibial Palpable: [Left:Yes] Doppler Audible: [Left:Yes] Blood Pressure: Brachial: [Left:111] Ankle: [Left:Dorsalis Pedis:  120 1.08] Electronic Signature(s) Signed: 09/08/2022 5:18:37 PM By: Deon Pilling RN, BSN Entered By: Deon Pilling on 09/08/2022 08:14:01 -------------------------------------------------------------------------------- Multi Wound Chart Details Patient Name: Date of Service: [place], Heather [place] J. 09/08/2022 8:00 A M Medical Record Number: [id number] Patient Account Number: [id number] Date of Birth/Sex: Treating RN: 11-Mar-1918 (88 y.o. F) Primary Care Inessa Wardrop: Marlowe Sax Other Clinician: Referring Karelyn Brisby: Treating Luca Dyar/Extender: Kalman Shan Ngetich, Dinah Weeks in Treatment: 0 Vital Signs Height(in): 59 Pulse(bpm): 80 Weight(lbs): 100 Blood Pressure(mmHg): 111/67 Body Mass Index(BMI): 20.2 KEYANNA, SANDEFER ([id number]) [id number] Page 5 of 10 Temperature(F): 97.6 Respiratory Rate(breaths/min): 20 [1:Photos:] [N/A:N/A] Left, Circumferential Lower Leg N/A N/A Wound Location: Skin T ear/Laceration N/A N/A Wounding Event: Venous Leg Ulcer N/A N/A Primary Etiology: Lymphedema N/A N/A Secondary Etiology: Hypertension, Peripheral Venous N/A N/A Comorbid History: Disease 09/08/2020 N/A N/A Date Acquired: 0 N/A N/A Weeks of Treatment: Open N/A N/A Wound Status: No N/A N/A Wound Recurrence: Yes N/A  N/A Clustered Wound: 2 N/A N/A Clustered Quantity: 24x18x0.1 N/A N/A Measurements L x W x D (cm) 339.292 N/A N/A A (cm) : rea 33.929 N/A N/A Volume (cm) : Full Thickness Without Exposed N/A N/A Classification: Support Structures Large N/A N/A Exudate A mount: Serous N/A N/A Exudate Type: amber N/A N/A Exudate Color: Distinct, outline attached N/A N/A Wound Margin: Large (67-100%) N/A N/A Granulation A mount: Red, Pink N/A N/A Granulation Quality: Small (1-33%) N/A N/A Necrotic A mount: Fat Layer (Subcutaneous Tissue): Yes N/A N/A Exposed Structures: Fascia: No Tendon: No Muscle: No Joint: No Bone: No None N/A N/A Epithelialization: Debridement - Excisional N/A N/A Debridement: Pre-procedure Verification/Time Out 08:50 N/A N/A Taken: Lidocaine N/A N/A Pain Control: Subcutaneous, Slough N/A N/A Tissue Debrided: Skin/Subcutaneous Tissue N/A N/A Level: 64 N/A N/A Debridement A (sq cm): rea Curette N/A N/A Instrument: Minimum N/A N/A Bleeding: Pressure N/A N/A Hemostasis A chieved: 0 N/A N/A Procedural Pain: 0 N/A N/A Post Procedural Pain: Procedure was tolerated well N/A N/A Debridement Treatment Response: 24x18x0.1 N/A N/A Post Debridement Measurements L x W x D (cm) 33.929 N/A N/A Post Debridement Volume: (cm) Excoriation: No N/A N/A Periwound Skin Texture: Induration: No Callus: No Crepitus: No Rash: No Scarring: No Maceration: Yes N/A N/A Periwound Skin Moisture: Dry/Scaly: No Erythema: Yes N/A N/A Periwound Skin Color: Hemosiderin Staining: Yes Atrophie Blanche: No Cyanosis: No Heather [name], Heather [name] ([id number]) [id number] Page 6 of 10 Ecchymosis: No Mottled: No Pallor: No Rubor: No Circumferential N/A N/A Erythema Location: No Abnormality N/A N/A Temperature: Yes N/A N/A Tenderness on Palpation: Debridement N/A N/A Procedures Performed: Treatment Notes Wound #1 (Lower Leg) Wound Laterality:  Left, Circumferential Cleanser Soap and Water Discharge Instruction: May shower and wash wound with dial antibacterial soap and water prior to dressing change. Peri-Wound Care Topical Gentamicin Discharge Instruction: As directed by physician Mupirocin Ointment Discharge Instruction: Apply Mupirocin (Bactroban) as instructed Primary Dressing KerraCel Ag Gelling Fiber Dressing, 4x5 in (silver alginate) Discharge Instruction: Apply silver alginate to wound bed as instructed Secondary Dressing ABD Pad, 5x9 Discharge Instruction: Apply over primary dressing as directed. Secured With Compression Wrap Kerlix Roll 4.5x3.1 (in/yd) Discharge Instruction: Lightly Apply Kerlix and Coban compression as directed. Coban Self-Adherent Wrap 4x5 (in/yd) Discharge Instruction: Lightly Apply over Kerlix as directed. Compression Stockings Add-Ons Electronic Signature(s) Signed: 09/08/2022 11:26:43 AM By: Kalman Shan Heather Entered By: Kalman Shan on 09/08/2022 09:32:35 -------------------------------------------------------------------------------- Multi-Disciplinary Care Plan Details Patient Name: Date of Service: Heather [name], Heather [place] J. 09/08/2022 8:00 A M Medical Record Number: [id number] Patient  Account Number: [id number] Date of Birth/Sex: Treating RN: 12/17/1917 (88 y.o. [name] Primary Care Alyssamae Klinck: Marlowe Sax Other Clinician: Referring Jodeci Roarty: Treating Greig Altergott/Extender: Kalman Shan Ngetich, Dinah Weeks in Treatment: 0 Active Inactive Orientation to the Wound Care Program Nursing Diagnoses: Knowledge deficit related to the wound healing center program ZYANYA, GLAZA ([id number]) [id number] Page 7 of 10 Goals: Patient/caregiver will verbalize understanding of the [place] Date Initiated: 09/08/2022 Target Resolution Date: 09/18/2022 Goal Status: Active Interventions: Provide education on orientation to  the wound center Notes: Wound/Skin Impairment Nursing Diagnoses: Impaired tissue integrity Knowledge deficit related to ulceration/compromised skin integrity Goals: Patient will have a decrease in wound volume by X% from date: (specify in notes) Date Initiated: 09/08/2022 Target Resolution Date: 09/19/2022 Goal Status: Active Patient/caregiver will verbalize understanding of skin care regimen Date Initiated: 09/08/2022 Target Resolution Date: 09/20/2022 Goal Status: Active Ulcer/skin breakdown will have a volume reduction of 30% by week 4 Date Initiated: 09/08/2022 Target Resolution Date: 09/20/2022 Goal Status: Active Ulcer/skin breakdown will have a volume reduction of 50% by week 8 Date Initiated: 09/08/2022 Target Resolution Date: 09/20/2022 Goal Status: Active Interventions: Assess patient/caregiver ability to obtain necessary supplies Assess patient/caregiver ability to perform ulcer/skin care regimen upon admission and as needed Assess ulceration(s) every visit Notes: Electronic Signature(s) Signed: 09/11/2022 4:44:37 PM By: Rhae Hammock RN Entered By: Rhae Hammock on 09/08/2022 09:05:10 -------------------------------------------------------------------------------- Pain Assessment Details Patient Name: Date of Service: [place], Heather [place]. 09/08/2022 8:00 A M Medical Record Number: [id number] Patient Account Number: [id number] Date of Birth/Sex: Treating RN: 29-Jun-1918 (88 y.o. [name] Primary Care Brooke Steinhilber: Marlowe Sax Other Clinician: Referring Cristiana Yochim: Treating Dorlisa Savino/Extender: Kalman Shan Ngetich, Dinah Weeks in Treatment: 0 Active Problems Location of Pain Severity and Description of Pain Patient Has Paino Yes Site Locations Pain LocationBELISA, Heather [name] ([id number]) [id number] Page 8 of 10 Pain Location: Generalized Pain, Pain in Ulcers Rate the pain. Current Pain Level: 9 Pain Management and  Medication Current Pain Management: Medication: Yes Cold Application: No Rest: Yes Massage: No Activity: No T.E.N.S.: No Heat Application: No Leg drop or elevation: No Is the Current Pain Management Adequate: Adequate How does your wound impact your activities of daily livingo Sleep: No Bathing: No Appetite: No Relationship With Others: No Bladder Continence: No Emotions: No Bowel Continence: No Work: No Toileting: No Drive: No Dressing: No Hobbies: No Engineer, maintenance) Signed: 09/08/2022 5:18:37 PM By: Deon Pilling RN, BSN Entered By: Deon Pilling on 09/08/2022 08:03:44 -------------------------------------------------------------------------------- Patient/Caregiver Education Details Patient Name: Date of Service: Heather [name], Heather Norm[place]ean [place] 10/23/2023andnbsp8:00 A M Medical Record Number: [id number] Patient Account Number: [id number] Date of Birth/Gender: Treating RN: 1918-05-11 (88 y.o. [name] Primary Care Physician: Marlowe Sax Other Clinician: Referring Physician: Treating Physician/Extender: Elsworth Soho in Treatment: 0 Education Assessment Education Provided To: Patient Education Topics Provided Welcome T The [place]: o Methods: Explain/Verbal Responses: State content correctly Electronic Signature(s) Signed: 09/11/2022 4:44:37 PM By: Rhae Hammock RN Entered By: Rhae Hammock on 09/08/2022 09:05:18 [place], Heather [name] ([id number]) [id number] Page 9 of 10 -------------------------------------------------------------------------------- Wound Assessment Details Patient Name: Date of Service: Heather [name]. 09/08/2022 8:00 A M Medical Record Number: [id number] Patient Account Number: [id number] Date of Birth/Sex: Treating RN: February 14, 1918 (88 y.o. [name] Primary Care Navin Dogan: Marlowe Sax Other Clinician: Referring Urbano Milhouse: Treating  Alyxandra Tenbrink/Extender: Kalman Shan Ngetich, Dinah Weeks in Treatment: 0 Wound Status Wound Number: 1 Primary Etiology: Venous Leg Ulcer Wound Location: Left,  Circumferential Lower Leg Secondary Etiology: Lymphedema Wounding Event: Skin Tear/Laceration Wound Status: Open Date Acquired: 09/08/2020 Comorbid History: Hypertension, Peripheral Venous Disease Weeks Of Treatment: 0 Clustered Wound: Yes Photos Wound Measurements Length: (cm) 24 Width: (cm) 18 Depth: (cm) 0.1 Clustered Quantity: 2 Area: (cm) 339.292 Volume: (cm) 33.929 % Reduction in Area: % Reduction in Volume: Epithelialization: None Tunneling: No Undermining: No Wound Description Classification: Full Thickness Without Exposed Sup Wound Margin: Distinct, outline attached Exudate Amount: Large Exudate Type: Serous Exudate Color: amber port Structures Foul Odor After Cleansing: No Slough/Fibrino Yes Wound Bed Granulation Amount: Large (67-100%) Exposed Structure Granulation Quality: Red, Pink Fascia Exposed: No Necrotic Amount: Small (1-33%) Fat Layer (Subcutaneous Tissue) Exposed: Yes Necrotic Quality: Adherent Slough Tendon Exposed: No Muscle Exposed: No Joint Exposed: No Bone Exposed: No Periwound Skin Texture Texture Color No Abnormalities Noted: No No Abnormalities Noted: No Callus: No Atrophie Blanche: No Crepitus: No Cyanosis: No Excoriation: No Ecchymosis: No Induration: No Erythema: Yes Rash: No Erythema Location: Circumferential Scarring: No Hemosiderin Staining: Yes Mottled: No Moisture Pallor: No No Abnormalities Noted: No Rubor: No Dry / Scaly: No Maceration: Yes Temperature / Pain Heather [name], Heather [name] ([id number]) [id number] Page 10 of 10 Temperature: No Abnormality Tenderness on Palpation: Yes Treatment Notes Wound #1 (Lower Leg) Wound Laterality: Left, Circumferential Cleanser Soap and Water Discharge Instruction: May shower and wash wound  with dial antibacterial soap and water prior to dressing change. Peri-Wound Care Topical Gentamicin Discharge Instruction: As directed by physician Mupirocin Ointment Discharge Instruction: Apply Mupirocin (Bactroban) as instructed Primary Dressing KerraCel Ag Gelling Fiber Dressing, 4x5 in (silver alginate) Discharge Instruction: Apply silver alginate to wound bed as instructed Secondary Dressing ABD Pad, 5x9 Discharge Instruction: Apply over primary dressing as directed. Secured With Compression Wrap Kerlix Roll 4.5x3.1 (in/yd) Discharge Instruction: Lightly Apply Kerlix and Coban compression as directed. Coban Self-Adherent Wrap 4x5 (in/yd) Discharge Instruction: Lightly Apply over Kerlix as directed. Compression Stockings Add-Ons Electronic Signature(s) Signed: 09/08/2022 5:18:37 PM By: Deon Pilling RN, BSN Entered By: Deon Pilling on 09/08/2022 08:18:06 -------------------------------------------------------------------------------- Vitals Details Patient Name: Date of Service: [place], Heather [place] J. 09/08/2022 8:00 A M Medical Record Number: [id number] Patient Account Number: [id number] Date of Birth/Sex: Treating RN: 21-Jun-1918 (88 y.o. [name] Primary Care Pernella Ackerley: Marlowe Sax Other Clinician: Referring Ryllie Nieland: Treating Dayln Tugwell/Extender: Kalman Shan Ngetich, Dinah Weeks in Treatment: 0 Vital Signs Time Taken: 08:00 Temperature (F): 97.6 Height (in): 59 Pulse (bpm): 80 Source: Stated Respiratory Rate (breaths/min): 20 Weight (lbs): 100 Blood Pressure (mmHg): 111/67 Source: Stated Reference Range: 80 - 120 mg / dl Body Mass Index (BMI): 20.2 Electronic Signature(s) Signed: 09/08/2022 5:18:37 PM By: Deon Pilling RN, BSN Entered By: Deon Pilling on 09/08/2022 08:01:37

## 2022-09-11 NOTE — Progress Notes (Signed)
ZEAH, GERMANO ([id number]) [id number] Page 1 of 8 Visit Report for 09/08/2022 Chief Complaint Document Details Patient Name: Date of Service: Heather [name]. 09/08/2022 8:00 A M Medical Record Number: [id number] Patient Account Number: [id number] Date of Birth/Sex: Treating RN: 01/18/1918 (88 y.o. F) Primary Care Provider: Marlowe Sax Other Clinician: Referring Provider: Treating Provider/Extender: Grier Rocher, Dinah Weeks in Treatment: 0 Information Obtained from: Patient Chief Complaint 09/08/2022; left lower extremity wound Electronic Signature(s) Signed: 09/08/2022 11:26:43 AM By: Kalman Shan DO Entered By: Kalman Shan on 09/08/2022 09:32:57 -------------------------------------------------------------------------------- Debridement Details Patient Name: Date of Service: [place], DO [place] J. 09/08/2022 8:00 A M Medical Record Number: [id number] Patient Account Number: [id number] Date of Birth/Sex: Treating RN: 11-Oct-1918 (88 y.o. Heather [name], Heather [name] Primary Care Provider: Marlowe Sax Other Clinician: Referring Provider: Treating Provider/Extender: Kalman Shan Ngetich, Dinah Weeks in Treatment: 0 Debridement Performed for Assessment: Wound #1 Left,Circumferential Lower Leg Performed By: Physician Kalman Shan, DO Debridement Type: Debridement Severity of Tissue Pre Debridement: Fat layer exposed Level of Consciousness (Pre-procedure): Awake and Alert Pre-procedure Verification/Time Out Yes - 08:50 Taken: Start Time: 08:50 Pain Control: Lidocaine T Area Debrided (L x W): otal 8 (cm) x 8 (cm) = 64 (cm) Tissue and other material debrided: Viable, Non-Viable, Slough, Subcutaneous, Slough Level: Skin/Subcutaneous Tissue Debridement Description: Excisional Instrument: Curette Bleeding: Minimum Hemostasis Achieved: Pressure End Time: 08:50 Procedural Pain: 0 Post Procedural Pain: 0 Response to  Treatment: Procedure was tolerated well Level of Consciousness (Post- Awake and Alert procedure): Post Debridement Measurements of Total Wound Length: (cm) 24 Width: (cm) 18 Depth: (cm) 0.1 Volume: (cm) 33.929 Character of Wound/Ulcer Post Debridement: Improved Severity of Tissue Post Debridement: Fat layer exposed Heather [name], Heather [name] ([id number]) [id number] Page 2 of 8 Post Procedure Diagnosis Same as Pre-procedure Electronic Signature(s) Signed: 09/08/2022 11:26:43 AM By: Kalman Shan DO Signed: 09/11/2022 4:44:37 PM By: Rhae Hammock RN Entered By: Rhae Hammock on 09/08/2022 09:02:13 -------------------------------------------------------------------------------- HPI Details Patient Name: Date of Service: [place], DO [place] J. 09/08/2022 8:00 A M Medical Record Number: [id number] Patient Account Number: [id number] Date of Birth/Sex: Treating RN: Oct 04, 1918 (88 y.o. F) Primary Care Provider: Marlowe Sax Other Clinician: Referring Provider: Treating Provider/Extender: Grier Rocher, Dinah Weeks in Treatment: 0 History of Present Illness HPI Description: Admission 09/08/2022 Heather [name] is a 88 year old female with a past medical history of venous insufficiency, chronic diastolic heart failure, dementia, and breast cancer that presents to the clinic for a 24-monthhistory of nonhealing ulcer to the left lower extremity. She states over the past 2 months the site has become more painful And is weeping more. She visited her primary care office on 10/17 and was prescribed doxycycline for cellulitis of the left lower extremity. It is unclear if this has helped her symptoms. She has home health and they have been using Medihoney under compression therapy. They come out twice weekly. Her ABIs in office were 1.08. Currently she denies systemic signs of infection. Electronic Signature(s) Signed: 09/08/2022 11:26:43 AM By:  HKalman ShanDO Entered By: HKalman Shanon 09/08/2022 09:36:08 -------------------------------------------------------------------------------- Physical Exam Details Patient Name: Date of Service[place] 09/08/2022 8:00 A M Medical Record Number: [id number] Account Number: [id number] of Birth/Sex: Treating RN: 704-10-1918(88y.o. F) Primary Care Provider: NMarlowe SaxOther Clinician: Referring Provider: Treating Provider/Extender: HKalman ShanNgetich, Dinah Weeks in Treatment: 0 Constitutional respirations regular, non-labored and within target range for patient.. Cardiovascular 2+ dorsalis pedis/posterior tibialis pulses. Psychiatric pleasant and  cooperative. Notes Left lower extremity: Large open wound with nonviable tissue to the dorsal foot. Erythematous shiny appearance to the lower extremity from the shin down. No weeping noted. No increased warmth or purulent drainage. Electronic Signature(s) Signed: 09/08/2022 11:26:43 AM By: Kalman Shan DO Entered By: Kalman Shan on 09/08/2022 09:38:27 Heather [name], Heather [name] ([id number]) [id number] Page 3 of 8 -------------------------------------------------------------------------------- Physician Orders Details Patient Name: Date of Service: Heather [name]. 09/08/2022 8:00 A M Medical Record Number: [id number] Patient Account Number: [id number] Date of Birth/Sex: Treating RN: 1918-09-21 (88 y.o. Heather [name] Primary Care Provider: Marlowe Sax Other Clinician: Referring Provider: Treating Provider/Extender: Kalman Shan Ngetich, Dinah Weeks in Treatment: 0 Verbal / Phone Orders: No Diagnosis Coding ICD-10 Coding Code Description [phone] Chronic venous hypertension (idiopathic) with ulcer of left lower extremity I10 Essential (primary) hypertension Follow-up Appointments ppointment in 1 week. - w/ Dr. Heber [place] and Heather [name] Return  A Anesthetic (In clinic) Topical Lidocaine 5% applied to wound bed Bathing/ Shower/ Hygiene May shower with protection but do not get wound dressing(s) wet. Edema Control - Lymphedema / SCD / Other Elevate legs to the level of the heart or above for 30 minutes daily and/or when sitting, a frequency of: Avoid standing for long periods of time. Home Health New wound care orders this week; continue Home Health for wound care. May utilize formulary equivalent dressing for wound treatment orders unless otherwise specified. - [facility] to change 2 x a week Wound Treatment Wound #1 - Lower Leg Wound Laterality: Left, Circumferential Cleanser: Soap and Water 3 x Per Week/15 Days Discharge Instructions: May shower and wash wound with dial antibacterial soap and water prior to dressing change. Topical: Gentamicin 3 x Per Week/15 Days Discharge Instructions: As directed by physician Topical: Mupirocin Ointment 3 x Per Week/15 Days Discharge Instructions: Apply Mupirocin (Bactroban) as instructed Prim Dressing: KerraCel Ag Gelling Fiber Dressing, 4x5 in (silver alginate) 3 x Per Week/15 Days ary Discharge Instructions: Apply silver alginate to wound bed as instructed Secondary Dressing: ABD Pad, 5x9 3 x Per Week/15 Days Discharge Instructions: Apply over primary dressing as directed. Compression Wrap: Kerlix Roll 4.5x3.1 (in/yd) 3 x Per Week/15 Days Discharge Instructions: Lightly Apply Kerlix and Coban compression as directed. Compression Wrap: Coban Self-Adherent Wrap 4x5 (in/yd) 3 x Per Week/15 Days Discharge Instructions: Lightly Apply over Kerlix as directed. [employer] PCR culture Patient Medications llergies: fentanyl, naproxen A Notifications Medication Indication Start End PRN debridements 09/08/2022 lidocaine ROLLANDE, THURSBY ([id number]) [id number] Page 4 of 8 DOSE topical 5 % gel - gel topical Electronic Signature(s) Signed: 09/08/2022 11:26:43  AM By: Kalman Shan DO Entered By: Kalman Shan on 09/08/2022 09:38:46 -------------------------------------------------------------------------------- Problem List Details Patient Name: Date of Service: [place], DO [place] 09/08/2022 8:00 A M Medical Record Number: [id number] Patient Account Number: [id number] Date of Birth/Sex: Treating RN: 1918-08-03 (88 y.o. F) Primary Care Provider: Marlowe Sax Other Clinician: Referring Provider: Treating Provider/Extender: Grier Rocher, Dinah Weeks in Treatment: 0 Active Problems ICD-10 Encounter Code Description Active Date MDM Diagnosis I87.312 Chronic venous hypertension (idiopathic) with ulcer of left lower extremity 09/08/2022 No Yes L97.822 Non-pressure chronic ulcer of other part of left lower leg with fat layer 09/08/2022 No Yes exposed L46.50 Chronic diastolic (congestive) heart failure 09/08/2022 No Yes I10 Essential (primary) hypertension 09/08/2022 No Yes Z85.3 Personal history of malignant neoplasm of breast 09/08/2022 No Yes Inactive Problems Resolved Problems Electronic Signature(s) Signed: 09/08/2022 11:26:43 AM By: Kalman Shan DO Entered By: Kalman Shan on 09/08/2022 09:42:53 --------------------------------------------------------------------------------  Progress Note Details Patient Name: Date of Service: Heather [name] 09/08/2022 8:00 A M Medical Record Number: [id number] Patient Account Number: [id number] Date of Birth/Sex: Treating RN: [birth year] (88 y.o. F) Primary Care Provider: Marlowe Sax Other Clinician: NORVA, Heather [name] ([id number]) [id number] Page 5 of 8 Referring Provider: Treating Provider/Extender: Grier Rocher, Dinah Weeks in Treatment: 0 Subjective Chief Complaint Information obtained from Patient 09/08/2022; left lower extremity wound History of Present Illness (HPI) Admission 09/08/2022 Mr. Messina Kosinski is  a 88 year old female with a past medical history of venous insufficiency, chronic diastolic heart failure, dementia, and breast cancer that presents to the clinic for a 71-monthhistory of nonhealing ulcer to the left lower extremity. She states over the past 2 months the site has become more painful And is weeping more. She visited her primary care office on 10/17 and was prescribed doxycycline for cellulitis of the left lower extremity. It is unclear if this has helped her symptoms. She has home health and they have been using Medihoney under compression therapy. They come out twice weekly. Her ABIs in office were 1.08. Currently she denies systemic signs of infection. Patient History Allergies fentanyl (Reaction: N/V), naproxen (Reaction: hypersensitivity) Social History Never smoker, Marital Status - Widowed, Alcohol Use - Never, Drug Use - No History, Caffeine Use - Never. Medical History Cardiovascular Patient has history of Hypertension, Peripheral Venous Disease Hospitalization/Surgery History - left mastectomy. Medical A Surgical History Notes nd Gastrointestinal laryngopharyngeal reflux disease diverticulosis Musculoskeletal osteoporosis carpel tunnel cervical spondylosis DDD Objective Constitutional respirations regular, non-labored and within target range for patient.. Vitals Time Taken: 8:00 AM, Height: 59 in, Source: Stated, Weight: 100 lbs, Source: Stated, BMI: 20.2, Temperature: 97.6 F, Pulse: 80 bpm, Respiratory Rate: 20 breaths/min, Blood Pressure: 111/67 mmHg. Cardiovascular 2+ dorsalis pedis/posterior tibialis pulses. Psychiatric pleasant and cooperative. General Notes: Left lower extremity: Large open wound with nonviable tissue to the dorsal foot. Erythematous shiny appearance to the lower extremity from the shin down. No weeping noted. No increased warmth or purulent drainage. Integumentary (Hair, Skin) Wound #1 status is Open. Original cause of wound was Skin  T ear/Laceration. The date acquired was: 09/08/2020. The wound is located on the Left,Circumferential Lower Leg. The wound measures 24cm length x 18cm width x 0.1cm depth; 339.292cm^2 area and 33.929cm^3 volume. There is Fat Layer (Subcutaneous Tissue) exposed. There is no tunneling or undermining noted. There is a large amount of serous drainage noted. The wound margin is distinct with the outline attached to the wound base. There is large (67-100%) red, pink granulation within the wound bed. There is a small (1-33%) amount of necrotic tissue within the wound bed including Adherent Slough. The periwound skin appearance exhibited: Maceration, Hemosiderin Staining, Erythema. The periwound skin appearance did not exhibit: Callus, Crepitus, Excoriation, Induration, Rash, Scarring, Dry/Scaly, Atrophie Blanche, Cyanosis, Ecchymosis, Mottled, Pallor, Rubor. The surrounding wound skin color is noted with erythema which is circumferential. Periwound temperature was noted as No Abnormality. The periwound has tenderness on palpation. Assessment Active Problems ICD-10 Chronic venous hypertension (idiopathic) with ulcer of left lower extremity Non-pressure chronic ulcer of other part of left lower leg with fat layer exposed Chronic diastolic (congestive) heart failure Heather [name], Heather [name]([id number] Page 6 of 8 Essential (primary) hypertension Personal history of malignant neoplasm of breast Patient presents with a 451-monthistory of nonhealing ulcer to her left lower extremity in the setting of chronic venous insufficiency and complicated by superficial wound infection and diastolic heart failure. I debrided nonviable  tissue. At this time I recommended a PCR culture T see if she would benefit from o Keystone topical antibiotic ointment. For now I recommended silver alginate with gentamicin and mupirocin under Kerlix/Coban. Patient has home health we will change the  dressing twice this week. I will see her back in 1 week. Procedures Wound #1 Pre-procedure diagnosis of Wound #1 is a Venous Leg Ulcer located on the Left,Circumferential Lower Leg .Severity of Tissue Pre Debridement is: Fat layer exposed. There was a Excisional Skin/Subcutaneous Tissue Debridement with a total area of 64 sq cm performed by Kalman Shan, DO. With the following instrument(s): Curette to remove Viable and Non-Viable tissue/material. Material removed includes Subcutaneous Tissue and Slough and after achieving pain control using Lidocaine. No specimens were taken. A time out was conducted at 08:50, prior to the start of the procedure. A Minimum amount of bleeding was controlled with Pressure. The procedure was tolerated well with a pain level of 0 throughout and a pain level of 0 following the procedure. Post Debridement Measurements: 24cm length x 18cm width x 0.1cm depth; 33.929cm^3 volume. Character of Wound/Ulcer Post Debridement is improved. Severity of Tissue Post Debridement is: Fat layer exposed. Post procedure Diagnosis Wound #1: Same as Pre-Procedure Plan Follow-up Appointments: Return Appointment in 1 week. - w/ Dr. Heber [place] and Heather [name] Anesthetic: (In clinic) Topical Lidocaine 5% applied to wound bed Bathing/ Shower/ Hygiene: May shower with protection but do not get wound dressing(s) wet. Edema Control - Lymphedema / SCD / Other: Elevate legs to the level of the heart or above for 30 minutes daily and/or when sitting, a frequency of: Avoid standing for long periods of time. Home Health: New wound care orders this week; continue Home Health for wound care. May utilize formulary equivalent dressing for wound treatment orders unless otherwise specified. - [facility] to change 2 x a week ordered were: PCR culture The following medication(s) was prescribed: lidocaine topical 5 % gel gel topical for PRN debridements was prescribed at facility WOUND #1: - Lower Leg Wound  Laterality: Left, Circumferential Cleanser: Soap and Water 3 x Per Week/15 Days Discharge Instructions: May shower and wash wound with dial antibacterial soap and water prior to dressing change. Topical: Gentamicin 3 x Per Week/15 Days Discharge Instructions: As directed by physician Topical: Mupirocin Ointment 3 x Per Week/15 Days Discharge Instructions: Apply Mupirocin (Bactroban) as instructed Prim Dressing: KerraCel Ag Gelling Fiber Dressing, 4x5 in (silver alginate) 3 x Per Week/15 Days ary Discharge Instructions: Apply silver alginate to wound bed as instructed Secondary Dressing: ABD Pad, 5x9 3 x Per Week/15 Days Discharge Instructions: Apply over primary dressing as directed. Com pression Wrap: Kerlix Roll 4.5x3.1 (in/yd) 3 x Per Week/15 Days Discharge Instructions: Lightly Apply Kerlix and Coban compression as directed. Com pression Wrap: Coban Self-Adherent Wrap 4x5 (in/yd) 3 x Per Week/15 Days Discharge Instructions: Lightly Apply over Kerlix as directed. 1. In office sharp debridement 2. Gentamicin/mupirocin ointment in office only, silver alginate all under Kerlix/Coban. 3. PCR cultureoo Keystone antibiotic ointment 4. Follow-up in 1 week Electronic Signature(s) Signed: 09/08/2022 11:26:43 AM By: Kalman Shan DO Entered By: Kalman Shan on 09/08/2022 09:43:05 Heather [name], Heather [name] ([id number]) [id number] Page 7 of 8 -------------------------------------------------------------------------------- HxROS Details Patient Name: Date of Service: Heather [name], DO [place]. 09/08/2022 8:00 A M Medical Record Number: [id number] Patient Account Number: [id number] Date of Birth/Sex: Treating RN: 1918-07-29 (88 y.o. Heather [name] Primary Care Provider: Marlowe Sax Other Clinician: Referring Provider: Treating Provider/Extender: Kalman Shan Ngetich, Dinah Weeks in  Treatment: 0 Cardiovascular Medical History: Positive for: Hypertension;  Peripheral Venous Disease Gastrointestinal Medical History: Past Medical History Notes: laryngopharyngeal reflux disease diverticulosis Musculoskeletal Medical History: Past Medical History Notes: osteoporosis carpel tunnel cervical spondylosis DDD Immunizations Pneumococcal Vaccine: Received Pneumococcal Vaccination: Yes Received Pneumococcal Vaccination On or After 60th Birthday: Yes Implantable Devices No devices added Hospitalization / Surgery History Type of Hospitalization/Surgery left mastectomy Family and Social History Never smoker; Marital Status - Widowed; Alcohol Use: Never; Drug Use: No History; Caffeine Use: Never; Financial Concerns: No; Food, Clothing or Shelter Needs: No; Support System Lacking: No Electronic Signature(s) Signed: 09/08/2022 11:26:43 AM By: Kalman Shan DO Signed: 09/08/2022 5:18:37 PM By: Deon Pilling RN, BSN Entered By: Deon Pilling on 09/08/2022 08:12:01 -------------------------------------------------------------------------------- SuperBill Details Patient Name: Date of Service: Heather [name], DO [place]. 09/08/2022 Medical Record Number: [id number] Patient Account Number: [id number] Date of Birth/Sex: Treating RN: 1918-05-21 (88 y.o. Heather [name] Primary Care Provider: Marlowe Sax Other Clinician: Referring Provider: Treating Provider/Extender: Kalman Shan Ngetich, Dinah Weeks in Treatment: 0 Diagnosis Coding ICD-10 Codes Code Description [phone] Chronic venous hypertension (idiopathic) with ulcer of left lower extremity MULAN, ADAN ([id number]) [id number] Page 8 of 8 [phone] Non-pressure chronic ulcer of other part of left lower leg with fat layer exposed W23.76 Chronic diastolic (congestive) heart failure I10 Essential (primary) hypertension Z85.3 Personal history of malignant neoplasm of breast Facility Procedures : CPT4 Code: [id number] Description: 99214 - WOUND CARE  VISIT-LEV 4 EST PT Modifier: Quantity: 1 : CPT4 Code: 16073710 Description: 62694 - DEB SUBQ TISSUE 20 SQ CM/< ICD-10 Diagnosis Description I87.312 Chronic venous hypertension (idiopathic) with ulcer of left lower extremity L97.822 Non-pressure chronic ulcer of other part of left lower leg with fat layer expos Modifier: ed Quantity: 1 : CPT4 Code: [id number] Description: 50093 - DEB SUBQ TISS EA ADDL 20CM ICD-10 Diagnosis Description I87.312 Chronic venous hypertension (idiopathic) with ulcer of left lower extremity L97.822 Non-pressure chronic ulcer of other part of left lower leg with fat layer expos Modifier: ed Quantity: 3 Physician Procedures : CPT4 Code Description Modifier [id number] 71696 - WC PHYS LEVEL 4 - NEW PT ICD-10 Diagnosis Description I87.312 Chronic venous hypertension (idiopathic) with ulcer of left lower extremity V89.38 Chronic diastolic (congestive) heart failure I10 Essential  (primary) hypertension L97.822 Non-pressure chronic ulcer of other part of left lower leg with fat layer exposed Quantity: 1 : [id number] 11042 - WC PHYS SUBQ TISS 20 SQ CM ICD-10 Diagnosis Description I87.312 Chronic venous hypertension (idiopathic) with ulcer of left lower extremity L97.822 Non-pressure chronic ulcer of other part of left lower leg with fat layer exposed Quantity: 1 : [id number] 11045 - WC PHYS SUBQ TISS EA ADDL 20 CM ICD-10 Diagnosis Description I87.312 Chronic venous hypertension (idiopathic) with ulcer of left lower extremity L97.822 Non-pressure chronic ulcer of other part of left lower leg with fat layer exposed Quantity: 3 Electronic Signature(s) Signed: 09/08/2022 11:26:43 AM By: Kalman Shan DO Entered By: Kalman Shan on 09/08/2022 09:43:22

## 2022-09-16 ENCOUNTER — Encounter (HOSPITAL_BASED_OUTPATIENT_CLINIC_OR_DEPARTMENT_OTHER): Payer: Medicare Other | Admitting: Internal Medicine

## 2022-09-16 DIAGNOSIS — L97822 Non-pressure chronic ulcer of other part of left lower leg with fat layer exposed: Secondary | ICD-10-CM | POA: Diagnosis not present

## 2022-09-16 DIAGNOSIS — I87312 Chronic venous hypertension (idiopathic) with ulcer of left lower extremity: Secondary | ICD-10-CM | POA: Diagnosis not present

## 2022-09-19 NOTE — Progress Notes (Signed)
TORAH, PINNOCK ([id number]) [id number] Page 1 of 8 Visit Report for 09/16/2022 Chief Complaint Document Details Patient Name: Date of Service: [name] 09/16/2022 10:00 A M Medical Record Number: [id number] Patient Account Number: [id number] Date of Birth/Sex: Treating RN: Mar 30, 1918 (88 y.o. Harlow Ohms Primary Care Provider: Marlowe Sax Other Clinician: Referring Provider: Treating Provider/Extender: Grier Rocher, Dinah Weeks in Treatment: 1 Information Obtained from: Patient Chief Complaint 09/08/2022; left lower extremity wound Electronic Signature(s) Signed: 09/19/2022 1:40:10 PM By: Kalman Shan DO Entered By: Kalman Shan on 09/16/2022 11:53:54 -------------------------------------------------------------------------------- Debridement Details Patient Name: Date of Service: Lapel, DO [place]. 09/16/2022 10:00 A M Medical Record Number: [id number] Patient Account Number: [id number] Date of Birth/Sex: Treating RN: 1918-09-26 (88 y.o. [name], Tammi Klippel Primary Care Provider: Marlowe Sax Other Clinician: Referring Provider: Treating Provider/Extender: Kalman Shan Ngetich, Dinah Weeks in Treatment: 1 Debridement Performed for Assessment: Wound #1 Left,Circumferential Lower Leg Performed By: Physician Kalman Shan, DO Debridement Type: Debridement Severity of Tissue Pre Debridement: Fat layer exposed Level of Consciousness (Pre-procedure): Awake and Alert Pre-procedure Verification/Time Out Yes - 11:15 Taken: Start Time: 11:16 Pain Control: Lidocaine 5% topical ointment T Area Debrided (L x W): otal 10 (cm) x 10 (cm) = 100 (cm) Tissue and other material debrided: Viable, Non-Viable, Slough, Subcutaneous, Skin: Dermis , Skin: Epidermis, Slough Level: Skin/Subcutaneous Tissue Debridement Description: Excisional Instrument: Curette, Forceps, Scissors Bleeding: None End Time:  11:22 Procedural Pain: 0 Post Procedural Pain: 2 Response to Treatment: Procedure was tolerated well Level of Consciousness (Post- Awake and Alert procedure): Post Debridement Measurements of Total Wound Length: (cm) 26 Width: (cm) 23 Depth: (cm) 0.1 Volume: (cm) 46.967 Character of Wound/Ulcer Post Debridement: Improved Severity of Tissue Post Debridement: Fat layer exposed KALYNN, DECLERCQ ([id number]) [id number] Page 2 of 8 Post Procedure Diagnosis Same as Pre-procedure Electronic Signature(s) Signed: 09/17/2022 10:46:15 AM By: Deon Pilling RN, BSN Signed: 09/19/2022 1:40:10 PM By: Kalman Shan DO Entered By: Deon Pilling on 09/16/2022 11:22:57 -------------------------------------------------------------------------------- HPI Details Patient Name: Date of Service: Belleair Shore, DO [place]. 09/16/2022 10:00 A M Medical Record Number: [id number] Patient Account Number: [id number] Date of Birth/Sex: Treating RN: 1918-07-13 (88 y.o. Harlow Ohms Primary Care Provider: Marlowe Sax Other Clinician: Referring Provider: Treating Provider/Extender: Kalman Shan Ngetich, Dinah Weeks in Treatment: 1 History of Present Illness HPI Description: Admission 09/08/2022 Mr. Caelan Atchley is a 88 year old female with a past medical history of venous insufficiency, chronic diastolic heart failure, dementia, and breast cancer that presents to the clinic for a 60-monthhistory of nonhealing ulcer to the left lower extremity. She states over the past 2 months the site has become more painful And is weeping more. She visited her primary care office on 10/17 and was prescribed doxycycline for cellulitis of the left lower extremity. It is unclear if this has helped her symptoms. She has home health and they have been using Medihoney under compression therapy. They come out twice weekly. Her ABIs in office were 1.08. Currently she denies systemic signs of  infection. 10/31; patient presents for follow-up. Patient grew high levels of Pseudomonas aeruginosa, Staphylococcus aureus and Enterococcus bacillus from pcr culture at last clinic visit. [facility] antibiotic ointment has been ordered for the patient, she has not yet received this. She has tolerated the wrap well. Electronic Signature(s) Signed: 09/19/2022 1:40:10 PM By: HKalman ShanDO Entered By: HKalman Shanon 09/16/2022 11:56:43 -------------------------------------------------------------------------------- Physical Exam Details Patient Name: Date of Service: [name], DO RETHEA J. 09/16/2022 10:00  A M Medical Record Number: [id number] Patient Account Number: [id number] Date of Birth/Sex: Treating RN: Mar 28, 1918 (87 y.o. Harlow Ohms Primary Care Provider: Marlowe Sax Other Clinician: Referring Provider: Treating Provider/Extender: Kalman Shan Ngetich, Dinah Weeks in Treatment: 1 Constitutional respirations regular, non-labored and within target range for patient.. Cardiovascular 2+ dorsalis pedis/posterior tibialis pulses. Psychiatric pleasant and cooperative. Notes Left lower extremity: Open wound with nonviable tissue to the dorsal foot. Erythematous shiny appearance to the lower extremity from the shin down. No weeping noted. No increased warmth or purulent drainage. A lot of dry flaking skin Electronic Signature(s) Signed: 09/19/2022 1:40:10 PM By: Alonna Minium ([id number]) By: Kalman Shan DO [id number] Page 3 of 8 Signed: 09/19/2022 1:40:10 PM Entered By: Kalman Shan on 09/16/2022 11:59:53 -------------------------------------------------------------------------------- Physician Orders Details Patient Name: Date of Service: [place], DO [place] 09/16/2022 10:00 A M Medical Record Number: [id number] Patient Account Number: [id number] Date of Birth/Sex: Treating RN: [birth year] (88 y.o.  [name], [name] Primary Care Provider: Marlowe Sax Other Clinician: Referring Provider: Treating Provider/Extender: Kalman Shan Ngetich, Dinah Weeks in Treatment: 1 Verbal / Phone Orders: No Diagnosis Coding ICD-10 Coding Code Description [phone] Chronic venous hypertension (idiopathic) with ulcer of left lower extremity L97.822 Non-pressure chronic ulcer of other part of left lower leg with fat layer exposed U63.33 Chronic diastolic (congestive) heart failure I10 Essential (primary) hypertension Z85.3 Personal history of malignant neoplasm of breast Follow-up Appointments ppointment in 2 weeks. - Dr. Heber Saxton Return A Other: - [facility] will reach out to you to purchase the topical antibiotics and they will ship to you. Once arrives home health to apply directly to left foot and leg. Anesthetic (In clinic) Topical Lidocaine 5% applied to wound bed Bathing/ Shower/ Hygiene May shower with protection but do not get wound dressing(s) wet. Edema Control - Lymphedema / SCD / Other Elevate legs to the level of the heart or above for 30 minutes daily and/or when sitting, a frequency of: Avoid standing for long periods of time. Home Health New wound care orders this week; continue Home Health for wound care. May utilize formulary equivalent dressing for wound treatment orders unless otherwise specified. - [facility] to change 2 x a week- Once topical antibiotics arrives home health to apply directly to left foot and leg. Wound Treatment Wound #1 - Lower Leg Wound Laterality: Left, Circumferential Cleanser: Soap and Water (Home Health) 3 x Per Week/15 Days Discharge Instructions: May shower and wash wound with dial antibacterial soap and water prior to dressing change. Peri-Wound Care: Triamcinolone 15 (g) 3 x Per Week/15 Days Discharge Instructions: apply only in clinic to left foot and leg. Topical: Gentamicin 3 x Per Week/15 Days Discharge Instructions: Apply only in  clinic. Topical: Mupirocin Ointment 3 x Per Week/15 Days Discharge Instructions: Apply only in clinic. Topical: [facility] topical antibiotics (Home Health) 3 x Per Week/15 Days Discharge Instructions: Once arrives apply directly to wounds to left foot and leg under the alginate Ag. Prim Dressing: KerraCel Ag Gelling Fiber Dressing, 4x5 in (silver alginate) (Home Health) 3 x Per Week/15 Days ary Discharge Instructions: Apply silver alginate to wound bed as instructed Secondary Dressing: ABD Pad, 5x9 ([place]) 3 x Per Week/15 Days Discharge Instructions: Apply over primary dressing as directed. Compression Wrap: Kerlix Roll 4.5x3.1 (in/yd) (Home Health) 3 x Per Week/15 Days Discharge Instructions: Lightly Apply Kerlix and Coban compression as directed. RONELL, BOLDIN ([id number]) [id number] Page 4 of 8 Compression Wrap: Coban Self-Adherent Wrap 4x5 (in/yd) (  Home Health) 3 x Per Week/15 Days Discharge Instructions: Lightly Apply over Kerlix as directed. Electronic Signature(s) Signed: 09/19/2022 1:40:10 PM By: Kalman Shan DO Entered By: Kalman Shan on 09/16/2022 12:00:00 -------------------------------------------------------------------------------- Problem List Details Patient Name: Date of Service: [name], DO [place] 09/16/2022 10:00 A M Medical Record Number: [id number] Patient Account Number: [id number] Date of Birth/Sex: Treating RN: 11-Aug-1918 (88 y.o. [name] Primary Care Provider: Marlowe Sax Other Clinician: Referring Provider: Treating Provider/Extender: Grier Rocher, Dinah Weeks in Treatment: 1 Active Problems ICD-10 Encounter Code Description Active Date MDM Diagnosis I87.312 Chronic venous hypertension (idiopathic) with ulcer of left lower extremity 09/08/2022 No Yes L97.822 Non-pressure chronic ulcer of other part of left lower leg with fat layer 09/08/2022 No Yes exposed J24.26 Chronic  diastolic (congestive) heart failure 09/08/2022 No Yes I10 Essential (primary) hypertension 09/08/2022 No Yes Z85.3 Personal history of malignant neoplasm of breast 09/08/2022 No Yes Inactive Problems Resolved Problems Electronic Signature(s) Signed: 09/19/2022 1:40:10 PM By: Kalman Shan DO Entered By: Kalman Shan on 09/16/2022 11:53:41 -------------------------------------------------------------------------------- Progress Note Details Patient Name: Date of Service: [name], DO [place]. 09/16/2022 10:00 [place] Record Number: [id number] Patient Account Number: [id number] Date of Birth/Sex: Treating RN: July 02, 1918 (88 y.o. [address], Aguilera, Kaytlynne J ([id number]) [id number] Page 5 of 8 Primary Care Provider: Marlowe Sax Other Clinician: Referring Provider: Treating Provider/Extender: Grier Rocher, Dinah Weeks in Treatment: 1 Subjective Chief Complaint Information obtained from Patient 09/08/2022; left lower extremity wound History of Present Illness (HPI) Admission 09/08/2022 Mr. Raychell Holcomb is a 88 year old female with a past medical history of venous insufficiency, chronic diastolic heart failure, dementia, and breast cancer that presents to the clinic for a 66-monthhistory of nonhealing ulcer to the left lower extremity. She states over the past 2 months the site has become more painful And is weeping more. She visited her primary care office on 10/17 and was prescribed doxycycline for cellulitis of the left lower extremity. It is unclear if this has helped her symptoms. She has home health and they have been using Medihoney under compression therapy. They come out twice weekly. Her ABIs in office were 1.08. Currently she denies systemic signs of infection. 10/31; patient presents for follow-up. Patient grew high levels of Pseudomonas aeruginosa, Staphylococcus aureus and Enterococcus bacillus from pcr  culture at last clinic visit. [facility] antibiotic ointment has been ordered for the patient, she has not yet received this. She has tolerated the wrap well. Patient History Social History Never smoker, Marital Status - Widowed, Alcohol Use - Never, Drug Use - No History, Caffeine Use - Never. Medical History Cardiovascular Patient has history of Hypertension, Peripheral Venous Disease Hospitalization/Surgery History - left mastectomy. Medical A Surgical History Notes nd Gastrointestinal laryngopharyngeal reflux disease diverticulosis Musculoskeletal osteoporosis carpel tunnel cervical spondylosis DDD Objective Constitutional respirations regular, non-labored and within target range for patient.. Vitals Time Taken: 10:47 AM, Height: 59 in, Weight: 100 lbs, BMI: 20.2, Temperature: 97.9 F, Pulse: 80 bpm, Respiratory Rate: 18 breaths/min, Blood Pressure: 124/73 mmHg. Cardiovascular 2+ dorsalis pedis/posterior tibialis pulses. Psychiatric pleasant and cooperative. General Notes: Left lower extremity: Open wound with nonviable tissue to the dorsal foot. Erythematous shiny appearance to the lower extremity from the shin down. No weeping noted. No increased warmth or purulent drainage. A lot of dry flaking skin Integumentary (Hair, Skin) Wound #1 status is Open. Original cause of wound was Skin T ear/Laceration. The date acquired was: 09/08/2020. The wound has been in treatment 1 weeks. The  wound is located on the Left,Circumferential Lower Leg. The wound measures 26cm length x 23cm width x 0.1cm depth; 469.668cm^2 area and 46.967cm^3 volume. There is Fat Layer (Subcutaneous Tissue) exposed. There is no tunneling or undermining noted. There is a large amount of serous drainage noted. The wound margin is distinct with the outline attached to the wound base. There is large (67-100%) red, pink granulation within the wound bed. There is a small (1- 33%) amount of necrotic tissue within the wound  bed including Adherent Slough. The periwound skin appearance exhibited: Dry/Scaly, Maceration. The periwound skin appearance did not exhibit: Callus, Crepitus, Excoriation, Induration, Rash, Scarring, Atrophie Blanche, Cyanosis, Ecchymosis, Hemosiderin Staining, Mottled, Pallor, Rubor, Erythema. Periwound temperature was noted as No Abnormality. The periwound has tenderness on palpation. Assessment Active Problems ICD-10 Chronic venous hypertension (idiopathic) with ulcer of left lower extremity Non-pressure chronic ulcer of other part of left lower leg with fat layer exposed Chronic diastolic (congestive) heart failure XZARIA, TEO ([id number]) [id number] Page 6 of 8 Essential (primary) hypertension Personal history of malignant neoplasm of breast Patient's wound has shown improvement in appearance since last clinic visit. I debrided nonviable tissue. Based on culture results we have sent an order for [facility] antibiotic ointment. Number to contact the company was also given to the patient. For now I recommended continuing gentamicin and mupirocin ointment with silver alginate under Kerlix/Coban. Procedures Wound #1 Pre-procedure diagnosis of Wound #1 is a Venous Leg Ulcer located on the Left,Circumferential Lower Leg .Severity of Tissue Pre Debridement is: Fat layer exposed. There was a Excisional Skin/Subcutaneous Tissue Debridement with a total area of 100 sq cm performed by Kalman Shan, DO. With the following instrument(s): Curette, Forceps, and Scissors to remove Viable and Non-Viable tissue/material. Material removed includes Subcutaneous Tissue, Slough, Skin: Dermis, and Skin: Epidermis after achieving pain control using Lidocaine 5% topical ointment. A time out was conducted at 11:15, prior to the start of the procedure. There was no bleeding. The procedure was tolerated well with a pain level of 0 throughout and a pain level of 2 following the  procedure. Post Debridement Measurements: 26cm length x 23cm width x 0.1cm depth; 46.967cm^3 volume. Character of Wound/Ulcer Post Debridement is improved. Severity of Tissue Post Debridement is: Fat layer exposed. Post procedure Diagnosis Wound #1: Same as Pre-Procedure Plan Follow-up Appointments: Return Appointment in 2 weeks. - Dr. Heber Kincaid Other: - [facility] will reach out to you to purchase the topical antibiotics and they will ship to you. Once arrives home health to apply directly to left foot and leg. Anesthetic: (In clinic) Topical Lidocaine 5% applied to wound bed Bathing/ Shower/ Hygiene: May shower with protection but do not get wound dressing(s) wet. Edema Control - Lymphedema / SCD / Other: Elevate legs to the level of the heart or above for 30 minutes daily and/or when sitting, a frequency of: Avoid standing for long periods of time. Home Health: New wound care orders this week; continue Home Health for wound care. May utilize formulary equivalent dressing for wound treatment orders unless otherwise specified. - [facility] to change 2 x a week- Once topical antibiotics arrives home health to apply directly to left foot and leg. WOUND #1: - Lower Leg Wound Laterality: Left, Circumferential Cleanser: Soap and Water (Home Health) 3 x Per Week/15 Days Discharge Instructions: May shower and wash wound with dial antibacterial soap and water prior to dressing change. Peri-Wound Care: Triamcinolone 15 (g) 3 x Per Week/15 Days Discharge Instructions: apply only in clinic to  left foot and leg. Topical: Gentamicin 3 x Per Week/15 Days Discharge Instructions: Apply only in clinic. Topical: Mupirocin Ointment 3 x Per Week/15 Days Discharge Instructions: Apply only in clinic. Topical: [facility] topical antibiotics (Home Health) 3 x Per Week/15 Days Discharge Instructions: Once arrives apply directly to wounds to left foot and leg under the alginate Ag. Prim Dressing:  KerraCel Ag Gelling Fiber Dressing, 4x5 in (silver alginate) (Home Health) 3 x Per Week/15 Days ary Discharge Instructions: Apply silver alginate to wound bed as instructed Secondary Dressing: ABD Pad, 5x9 ([place]) 3 x Per Week/15 Days Discharge Instructions: Apply over primary dressing as directed. Com pression Wrap: Kerlix Roll 4.5x3.1 (in/yd) (Home Health) 3 x Per Week/15 Days Discharge Instructions: Lightly Apply Kerlix and Coban compression as directed. Com pression Wrap: Coban Self-Adherent Wrap 4x5 (in/yd) (Home Health) 3 x Per Week/15 Days Discharge Instructions: Lightly Apply over Kerlix as directed. 1. In office sharp debridement 2. Antibiotic ointment with silver alginate under Kerlix/Coban 3. Follow-up in 1 week for nurse visit in 2 weeks for physician visit 4. [facility] antibiotic ointment has been orderedoopatient was given number as well to call Electronic Signature(s) Signed: 09/19/2022 1:40:10 PM By: Kalman Shan DO Entered By: Kalman Shan on 09/16/2022 12:10:26 Veanna, Dower [name] ([id number]) [id number] Page 7 of 8 -------------------------------------------------------------------------------- HxROS Details Patient Name: Date of Service: [name]. 09/16/2022 10:00 A M Medical Record Number: [id number] Patient Account Number: [id number] Date of Birth/Sex: Treating RN: 1918-10-11 (88 y.o. Harlow Ohms Primary Care Provider: Marlowe Sax Other Clinician: Referring Provider: Treating Provider/Extender: Kalman Shan Ngetich, Dinah Weeks in Treatment: 1 Cardiovascular Medical History: Positive for: Hypertension; Peripheral Venous Disease Gastrointestinal Medical History: Past Medical History Notes: laryngopharyngeal reflux disease diverticulosis Musculoskeletal Medical History: Past Medical History Notes: osteoporosis carpel tunnel cervical spondylosis DDD Immunizations Pneumococcal  Vaccine: Received Pneumococcal Vaccination: Yes Received Pneumococcal Vaccination On or After 60th Birthday: Yes Implantable Devices No devices added Hospitalization / Surgery History Type of Hospitalization/Surgery left mastectomy Family and Social History Never smoker; Marital Status - Widowed; Alcohol Use: Never; Drug Use: No History; Caffeine Use: Never; Financial Concerns: No; Food, Clothing or Shelter Needs: No; Support System Lacking: No Electronic Signature(s) Signed: 09/16/2022 4:24:58 PM By: Adline Peals Signed: 09/19/2022 1:40:10 PM By: Kalman Shan DO Entered By: Kalman Shan on 09/16/2022 11:56:48 -------------------------------------------------------------------------------- SuperBill Details Patient Name: Date of Service: [place], DO [place]. 09/16/2022 Medical Record Number: [id number] Patient Account Number: [id number] Date of Birth/Sex: Treating RN: January 31, 1918 (88 y.o. [name] Primary Care Provider: Marlowe Sax Other Clinician: Referring Provider: Treating Provider/Extender: Kalman Shan Ngetich, Dinah Weeks in Treatment: 1 Diagnosis Coding ICD-10 Codes Code Description [phone] Chronic venous hypertension (idiopathic) with ulcer of left lower extremity SALLE, BRANDLE ([id number]) [id number] Page 8 of 8 [phone] Non-pressure chronic ulcer of other part of left lower leg with fat layer exposed M38.46 Chronic diastolic (congestive) heart failure I10 Essential (primary) hypertension Z85.3 Personal history of malignant neoplasm of breast Facility Procedures : CPT4 Code: [id number] Description: 17793 - DEB SUBQ TISSUE 20 SQ CM/< ICD-10 Diagnosis Description L97.822 Non-pressure chronic ulcer of other part of left lower leg with fat layer expo Modifier: sed Quantity: 1 : CPT4 Code: [id number] Description: 11045 - DEB SUBQ TISS EA ADDL 20CM ICD-10 Diagnosis Description L97.822 Non-pressure chronic ulcer of  other part of left lower leg with fat layer expo Modifier: sed Quantity: 4 Physician Procedures : CPT4 Code Description Modifier [id number] 11042 - WC PHYS SUBQ TISS 20 SQ  CM ICD-10 Diagnosis Description L97.822 Non-pressure chronic ulcer of other part of left lower leg with fat layer exposed Quantity: 1 : [id number] 11045 - WC PHYS SUBQ TISS EA ADDL 20 CM ICD-10 Diagnosis Description L97.822 Non-pressure chronic ulcer of other part of left lower leg with fat layer exposed Quantity: 4 Electronic Signature(s) Signed: 09/19/2022 1:40:10 PM By: Kalman Shan DO Entered By: Kalman Shan on 09/16/2022 12:11:02

## 2022-09-19 NOTE — Progress Notes (Signed)
Heather Heather [name] ([id number]) [id number] Page 1 of 8 Visit Report for 09/16/2022 Arrival Information Details Patient Name: Date of Service: Heather [name] Heather Heather [name] 09/16/2022 10:00 Heather Heather [name] Record Number: [id number] Patient Account Number: [id number] Date of Birth/Sex: Treating RN: Nov 26, 1917 (88 y.o. Harlow Ohms Primary Care Nayara Taplin: Marlowe Sax Other Clinician: Referring Paislei Dorval: Treating Krysten Veronica/Extender: Kalman Shan Ngetich, Dinah Weeks in Treatment: 1 Visit Information History Since Last Visit Added or deleted any medications: No Patient Arrived: Wheel Chair Any new allergies or adverse reactions: No Arrival Time: 10:44 Had a fall or experienced change in No Accompanied By: son activities of daily living that may affect Transfer Assistance: Manual risk of falls: Patient Identification Verified: Yes Signs or symptoms of abuse/neglect since last visito No Secondary Verification Process Completed: Yes Hospitalized since last visit: No Patient Requires Transmission-Based Precautions: No Implantable device outside of the clinic excluding No Patient Has Alerts: No cellular tissue based products placed in the center since last visit: Has Compression in Place as Prescribed: Yes Pain Present Now: Yes Electronic Signature(s) Signed: 09/18/2022 11:40:36 AM By: Erenest Blank Entered By: Erenest Blank on 09/16/2022 10:44:29 -------------------------------------------------------------------------------- Encounter Discharge Information Details Patient Name: Date of Service: Heather [name] Heather Heather [name] Heather [place]. 09/16/2022 10:00 A M Medical Record Number: [id number] Patient Account Number: [id number] Date of Birth/Sex: Treating RN: May 01, 1918 (88 y.o. Heather Heather [name] Primary Care Diella Gillingham: Marlowe Sax Other Clinician: Referring Jayleon Mcfarlane: Treating Boyd Buffalo/Extender: Kalman Shan Ngetich, Dinah Weeks in Treatment: 1 Encounter Discharge  Information Items Post Procedure Vitals Discharge Condition: Stable Temperature (F): 97.9 Ambulatory Status: Wheelchair Pulse (bpm): 80 Discharge Destination: Home Respiratory Rate (breaths/min): 20 Transportation: Private Auto Blood Pressure (mmHg): 124/73 Accompanied By: son Schedule Follow-up Appointment: Yes Clinical Summary of Care: Electronic Signature(s) Signed: 09/17/2022 10:46:15 AM By: Deon Pilling RN, BSN Entered By: Deon Pilling on 09/16/2022 11:24:46 Isip, [name] ([id number]) [id number] Page 2 of 8 -------------------------------------------------------------------------------- Lower Extremity Assessment Details Patient Name: Date of Service: Heather [name] Heather Heather [name] 09/16/2022 10:00 A M Medical Record Number: [id number] Patient Account Number: [id number] Date of Birth/Sex: Treating RN: 23-Dec-1917 (88 y.o. Harlow Ohms Primary Care Chesley Veasey: Marlowe Sax Other Clinician: Referring Tobey Schmelzle: Treating Jaeveon [place]/Extender: Kalman Shan Ngetich, Dinah Weeks in Treatment: 1 Edema Assessment Assessed: [Left: No] [Right: No] Edema: [Left: Ye] [Right: s] Calf Left: Right: Point of Measurement: 32 cm From Medial Instep 29 cm Ankle Left: Right: Point of Measurement: 10 cm From Medial Instep 24.7 cm Electronic Signature(s) Signed: 09/16/2022 4:24:58 PM By: Adline Peals Signed: 09/18/2022 11:40:36 AM By: Erenest Blank Entered By: Erenest Blank on 09/16/2022 11:00:19 -------------------------------------------------------------------------------- Multi Wound Chart Details Patient Name: Date of Service: Heather [name] Heather Heather [name] [place]. 09/16/2022 10:00 A M Medical Record Number: [id number] Patient Account Number: [id number] Date of Birth/Sex: Treating RN: Nov 02, 1918 (88 y.o. Harlow Ohms Primary Care Markiesha Delia: Marlowe Sax Other Clinician: Referring Akeelah Seppala: Treating Sheron Tallman/Extender: Kalman Shan Ngetich,  Dinah Weeks in Treatment: 1 Vital Signs Height(in): 59 Pulse(bpm): 80 Weight(lbs): 100 Blood Pressure(mmHg): 124/73 Body Mass Index(BMI): 20.2 Temperature(F): 97.9 Respiratory Rate(breaths/min): 18 [1:Photos:] [N/A:N/A] Left, Circumferential Lower Leg N/A N/A Wound Location: Skin T ear/Laceration N/A N/A Wounding Event: Venous Leg Ulcer N/A N/A Primary Etiology: Lymphedema N/A N/A Secondary Etiology: Hypertension, Peripheral Venous N/A N/A Comorbid History: Disease 09/08/2020 N/A N/A Date Acquired: 1 N/A N/A Weeks of Treatment: Open N/A N/A Wound Status: No N/A N/A Wound Recurrence: Yes N/A N/A Clustered Wound: 2 N/A N/A Clustered Quantity: 26x23x0.1 N/A N/A Measurements L x W x D (cm) 573.220  N/A N/A A (cm) : rea 46.967 N/A N/A Volume (cm) : -38.40% N/A N/A % Reduction in A rea: -38.40% N/A N/A % Reduction in Volume: Full Thickness Without Exposed N/A N/A Classification: Support Structures Large N/A N/A Exudate A mount: Serous N/A N/A Exudate Type: amber N/A N/A Exudate Color: Distinct, outline attached N/A N/A Wound Margin: Large (67-100%) N/A N/A Granulation A mount: Red, Pink N/A N/A Granulation Quality: Small (1-33%) N/A N/A Necrotic A mount: Fat Layer (Subcutaneous Tissue): Yes N/A N/A Exposed Structures: Fascia: No Tendon: No Muscle: No Joint: No Bone: No None N/A N/A Epithelialization: Debridement - Excisional N/A N/A Debridement: Pre-procedure Verification/Time Out 11:15 N/A N/A Taken: Lidocaine 5% topical ointment N/A N/A Pain Control: Subcutaneous, Slough N/A N/A Tissue Debrided: Skin/Subcutaneous Tissue N/A N/A Level: 100 N/A N/A Debridement A (sq cm): rea Curette, Forceps, Scissors N/A N/A Instrument: None N/A N/A Bleeding: 0 N/A N/A Procedural Pain: 2 N/A N/A Post Procedural Pain: Procedure was tolerated well N/A N/A Debridement Treatment Response: 26x23x0.1 N/A N/A Post Debridement Measurements L x W x  D (cm) 46.967 N/A N/A Post Debridement Volume: (cm) Excoriation: No N/A N/A Periwound Skin Texture: Induration: No Callus: No Crepitus: No Rash: No Scarring: No Maceration: Yes N/A N/A Periwound Skin Moisture: Dry/Scaly: Yes Atrophie Blanche: No N/A N/A Periwound Skin Color: Cyanosis: No Ecchymosis: No Erythema: No Hemosiderin Staining: No Mottled: No Pallor: No Rubor: No No Abnormality N/A N/A Temperature: Yes N/A N/A Tenderness on Palpation: Debridement N/A N/A Procedures Performed: Treatment Notes Wound #1 (Lower Leg) Wound Laterality: Left, Circumferential Cleanser Soap and Water Discharge Instruction: May shower and wash wound with dial antibacterial soap and water prior to dressing change. Peri-Wound Care Triamcinolone 15 (g) Discharge Instruction: apply only in clinic to left foot and leg. MAKITA, BLOW ([id number]) [id number] Page 4 of 8 Topical Gentamicin Discharge Instruction: Apply only in clinic. Mupirocin Ointment Discharge Instruction: Apply only in clinic. [facility] topical antibiotics Discharge Instruction: Once arrives apply directly to wounds to left foot and leg under the alginate Ag. Primary Dressing KerraCel Ag Gelling Fiber Dressing, 4x5 in (silver alginate) Discharge Instruction: Apply silver alginate to wound bed as instructed Secondary Dressing ABD Pad, 5x9 Discharge Instruction: Apply over primary dressing as directed. Secured With Compression Wrap Kerlix Roll 4.5x3.1 (in/yd) Discharge Instruction: Lightly Apply Kerlix and Coban compression as directed. Coban Self-Adherent Wrap 4x5 (in/yd) Discharge Instruction: Lightly Apply over Kerlix as directed. Compression Stockings Add-Ons Electronic Signature(s) Signed: 09/16/2022 4:24:58 PM By: Adline Peals Signed: 09/19/2022 1:40:10 PM By: Kalman Shan [name] Entered By: Kalman Shan on 09/16/2022  11:53:48 -------------------------------------------------------------------------------- [place] Details Patient Name: Date of Service: [name], [name] [place] 09/16/2022 10:00 A M Medical Record Number: [id number] Patient Account Number: [id number] Date of Birth/Sex: Treating RN: [birth year] (88 y.o. Heather Heather [name] Primary Care Narvel Kozub: Marlowe Sax Other Clinician: Referring Lavanna Rog: Treating Quint Chestnut/Extender: Kalman Shan Ngetich, Dinah Weeks in Treatment: 1 Active Inactive Wound/Skin Impairment Nursing Diagnoses: Impaired tissue integrity Knowledge deficit related to ulceration/compromised skin integrity Goals: Patient will have a decrease in wound volume by X% from date: (specify in notes) Date Initiated: 09/08/2022 Target Resolution Date: 11/06/2022 Goal Status: Active Patient/caregiver will verbalize understanding of skin care regimen Date Initiated: 09/08/2022 Target Resolution Date: 11/13/2022 Goal Status: Active Ulcer/skin breakdown will have a volume reduction of 30% by week 4 Date Initiated: 09/08/2022 Target Resolution Date: 10/10/2022 Goal Status: Active Ulcer/skin breakdown will have a volume reduction of 50% by week 8 Date Initiated: 09/08/2022 Target Resolution Date:  11/06/2022 Goal Status: Active ARANTZA, DARRINGTON ([id number]) [id number] Page 5 of 8 Interventions: Assess patient/caregiver ability to obtain necessary supplies Assess patient/caregiver ability to perform ulcer/skin care regimen upon admission and as needed Assess ulceration(s) every visit Notes: Electronic Signature(s) Signed: 09/17/2022 10:46:15 AM By: Deon Pilling RN, BSN Entered By: Deon Pilling on 09/16/2022 11:04:29 -------------------------------------------------------------------------------- Pain Assessment Details Patient Name: Date of Service: Heather [name] Heather Heather [name] [place]. 09/16/2022 10:00 [place] Record Number:  [id number] Patient Account Number: [id number] Date of Birth/Sex: Treating RN: 11-11-1918 (88 y.o. Harlow Ohms Primary Care Nessa Ramaker: Marlowe Sax Other Clinician: Referring Charnell Peplinski: Treating Steven Basso/Extender: Kalman Shan Ngetich, Dinah Weeks in Treatment: 1 Active Problems Location of Pain Severity and Description of Pain Patient Has Paino Yes Site Locations Pain Location: Pain in Ulcers Rate the pain. Current Pain Level: 9 Character of Pain Describe the Pain: Burning Pain Management and Medication Current Pain Management: Electronic Signature(s) Signed: 09/16/2022 4:24:58 PM By: Adline Peals Signed: 09/18/2022 11:40:36 AM By: Erenest Blank Entered By: Erenest Blank on 09/16/2022 10:45:13 Patient/Caregiver Education Details -------------------------------------------------------------------------------- [name] ([id number]) [id number] Page 6 of 8 Patient Name: Date of Service: Heather [name] Heather Heather [name] RETHEA J. 10/31/2023andnbsp10:00 A M Medical Record Number: [id number] Patient Account Number: [id number] Date of Birth/Gender: Treating RN: [birth year] (88 y.o. Heather Heather [name] Primary Care Physician: Marlowe Sax Other Clinician: Referring Physician: Treating Physician/Extender: Elsworth S[place] in Treatment: 1 Education Assessment Education Provided To: Patient Education Topics Provided Wound/Skin Impairment: Handouts: Skin Care [name]'s and Dont's Methods: Explain/Verbal Responses: Reinforcements needed Electronic Signature(s) Signed: 09/17/2022 10:46:15 AM By: Deon Pilling RN, BSN Entered By: Deon Pilling on 09/16/2022 11:05:00 -------------------------------------------------------------------------------- Wound Assessment Details Patient Name: Date of Service: [place], [name] [place]. 09/16/2022 10:00 A M Medical Record Number: [id number] Patient Account Number: [id number] Date of Birth/Sex:  Treating RN: August 10, 1918 (88 y.o. Harlow Ohms Primary Care Tyneka Scafidi: Marlowe Sax Other Clinician: Referring Tharun Cappella: Treating Willies Laviolette/Extender: Kalman Shan Ngetich, Dinah Weeks in Treatment: 1 Wound Status Wound Number: 1 Primary Etiology: Venous Leg Ulcer Wound Location: Left, Circumferential Lower Leg Secondary Etiology: Lymphedema Wounding Event: Skin Tear/Laceration Wound Status: Open Date Acquired: 09/08/2020 Comorbid History: Hypertension, Peripheral Venous Disease Weeks Of Treatment: 1 Clustered Wound: Yes Photos Wound Measurements Length: (cm) 26 Width: (cm) 23 Depth: (cm) 0.1 Clustered Quantity: 2 Area: (cm) 469.668 Volume: (cm) 46.967 % Reduction in Area: -38.4% % Reduction in Volume: -38.4% Epithelialization: None Tunneling: No Undermining: No Wound Description Classification: Full Thickness Without Exposed Support Structures ANEEKA, BOWDEN ([id number]) Wound Margin: Distinct, outline attached Exudate Amount: Large Exudate Type: Serous Exudate Color: amber Foul Odor After Cleansing: No [id number] Page 7 of 8 Slough/Fibrino Yes Wound Bed Granulation Amount: Large (67-100%) Exposed Structure Granulation Quality: Red, Pink Fascia Exposed: No Necrotic Amount: Small (1-33%) Fat Layer (Subcutaneous Tissue) Exposed: Yes Necrotic Quality: Adherent Slough Tendon Exposed: No Muscle Exposed: No Joint Exposed: No Bone Exposed: No Periwound Skin Texture Texture Color No Abnormalities Noted: No No Abnormalities Noted: No Callus: No Atrophie Blanche: No Crepitus: No Cyanosis: No Excoriation: No Ecchymosis: No Induration: No Erythema: No Rash: No Hemosiderin Staining: No Scarring: No Mottled: No Pallor: No Moisture Rubor: No No Abnormalities Noted: No Dry / Scaly: Yes Temperature / Pain Maceration: Yes Temperature: No Abnormality Tenderness on Palpation: Yes Treatment Notes Wound #1 (Lower Leg)  Wound Laterality: Left, Circumferential Cleanser Soap and Water Discharge Instruction: May shower and wash wound with dial antibacterial soap and water prior to dressing change. Peri-Wound Care Triamcinolone 15 (g)  Discharge Instruction: apply only in clinic to left foot and leg. Topical Gentamicin Discharge Instruction: Apply only in clinic. Mupirocin Ointment Discharge Instruction: Apply only in clinic. [facility]'s topical antibiotics Discharge Instruction: Once arrives apply directly to wounds to left foot and leg under the alginate Ag. Primary Dressing KerraCel Ag Gelling Fiber Dressing, 4x5 in (silver alginate) Discharge Instruction: Apply silver alginate to wound bed as instructed Secondary Dressing ABD Pad, 5x9 Discharge Instruction: Apply over primary dressing as directed. Secured With Compression Wrap Kerlix Roll 4.5x3.1 (in/yd) Discharge Instruction: Lightly Apply Kerlix and Coban compression as directed. Coban Self-Adherent Wrap 4x5 (in/yd) Discharge Instruction: Lightly Apply over Kerlix as directed. Compression Stockings Add-Ons Electronic Signature(s) Signed: 09/16/2022 4:24:58 PM By: Adline Peals Signed: 09/18/2022 11:40:36 AM By: Erenest Blank Entered By: Erenest Blank on 09/16/2022 11:04:39 Onesty, Clair [name] ([id number]) [id number] Page 8 of 8 -------------------------------------------------------------------------------- Vitals Details Patient Name: Date of Service: Heather [name] Heather Heather [name] 09/16/2022 10:00 A M Medical Record Number: [id number] Patient Account Number: [id number] Date of Birth/Sex: Treating RN: 18-Oct-1918 (88 y.o. Harlow Ohms Primary Care Rebekah Sprinkle: Marlowe Sax Other Clinician: Referring Osker Ayoub: Treating Deshawnda Acrey/Extender: Kalman Shan Ngetich, Dinah Weeks in Treatment: 1 Vital Signs Time Taken: 10:47 Temperature (F): 97.9 Height (in): 59 Pulse (bpm): 80 Weight (lbs):  100 Respiratory Rate (breaths/min): 18 Body Mass Index (BMI): 20.2 Blood Pressure (mmHg): 124/73 Reference Range: 80 - 120 mg / dl Electronic Signature(s) Signed: 09/18/2022 11:40:36 AM By: Erenest Blank Entered By: Erenest Blank on 09/16/2022 10:48:23

## 2022-09-30 ENCOUNTER — Encounter (HOSPITAL_BASED_OUTPATIENT_CLINIC_OR_DEPARTMENT_OTHER): Payer: Medicare Other | Attending: Internal Medicine | Admitting: Internal Medicine

## 2022-09-30 DIAGNOSIS — I5032 Chronic diastolic (congestive) heart failure: Secondary | ICD-10-CM | POA: Diagnosis not present

## 2022-09-30 DIAGNOSIS — I110 Hypertensive heart disease with heart failure: Secondary | ICD-10-CM | POA: Insufficient documentation

## 2022-09-30 DIAGNOSIS — I890 Lymphedema, not elsewhere classified: Secondary | ICD-10-CM | POA: Insufficient documentation

## 2022-09-30 DIAGNOSIS — I872 Venous insufficiency (chronic) (peripheral): Secondary | ICD-10-CM | POA: Insufficient documentation

## 2022-09-30 DIAGNOSIS — F0390 Unspecified dementia without behavioral disturbance: Secondary | ICD-10-CM | POA: Insufficient documentation

## 2022-09-30 DIAGNOSIS — L97822 Non-pressure chronic ulcer of other part of left lower leg with fat layer exposed: Secondary | ICD-10-CM | POA: Insufficient documentation

## 2022-09-30 DIAGNOSIS — L03116 Cellulitis of left lower limb: Secondary | ICD-10-CM | POA: Insufficient documentation

## 2022-10-01 NOTE — Progress Notes (Signed)
Heather [name] ([id number]) [id number] Page 1 of 8 Visit Report for 09/30/2022 Chief Complaint Document Details Patient Name: Date of Service: HA Heather [name] 09/30/2022 10:45 A M Medical Record Number: [id number] Patient Account Number: [id number] Date of Birth/Sex: Treating RN: 06-18-1918 (88 y.o. F) Primary Care Provider: Marlowe Sax Other Clinician: Referring Provider: Treating Provider/Extender: Grier Rocher, Dinah Weeks in Treatment: 3 Information Obtained from: Patient Chief Complaint 09/08/2022; left lower extremity wound Electronic Signature(s) Signed: 09/30/2022 2:06:49 PM By: Kalman Shan DO Entered By: Kalman Shan on 09/30/2022 13:03:21 -------------------------------------------------------------------------------- Debridement Details Patient Name: Date of Service: [name], DO [place] 09/30/2022 10:45 A M Medical Record Number: [id number] Patient Account Number: [id number] Date of Birth/Sex: Treating RN: 1917-11-21 (88 y.o. Heather [name] Primary Care Provider: Marlowe Sax Other Clinician: Referring Provider: Treating Provider/Extender: Kalman Shan Ngetich, Dinah Weeks in Treatment: 3 Debridement Performed for Assessment: Wound #1 Left,Circumferential Lower Leg Performed By: Physician Kalman Shan, DO Debridement Type: Debridement Severity of Tissue Pre Debridement: Limited to breakdown of skin Level of Consciousness (Pre-procedure): Awake and Alert Pre-procedure Verification/Time Out Yes - 11:55 Taken: Start Time: 11:56 T Area Debrided (L x W): otal 4 (cm) x 4 (cm) = 16 (cm) Tissue and other material debrided: Non-Viable, Callus, Skin: Epidermis Level: Skin/Epidermis Debridement Description: Selective/Open Wound Instrument: Curette Bleeding: None End Time: 12:01 Procedural Pain: 0 Post Procedural Pain: 0 Response to Treatment: Procedure was tolerated well Level of  Consciousness (Post- Awake and Alert procedure): Post Debridement Measurements of Total Wound Length: (cm) 0.1 Width: (cm) 0.1 Depth: (cm) 0.1 Volume: (cm) 0.001 Character of Wound/Ulcer Post Debridement: Improved Severity of Tissue Post Debridement: Limited to breakdown of skin Post Procedure Diagnosis CHAREESE, SERGENT ([id number]) [id number] Page 2 of 8 Same as Pre-procedure Electronic Signature(s) Signed: 09/30/2022 2:06:49 PM By: Kalman Shan DO Signed: 09/30/2022 5:53:09 PM By: Deon Pilling RN, BSN Entered By: Deon Pilling on 09/30/2022 12:01:18 -------------------------------------------------------------------------------- HPI Details Patient Name: Date of Service: [name], DO [place] 09/30/2022 10:45 A M Medical Record Number: [id number] Patient Account Number: [id number] Date of Birth/Sex: Treating RN: 1918/06/08 (88 y.o. F) Primary Care Provider: Marlowe Sax Other Clinician: Referring Provider: Treating Provider/Extender: Grier Rocher, Dinah Weeks in Treatment: 3 History of Present Illness HPI Description: Admission 09/08/2022 [name] is a 88 year old female with a past medical history of venous insufficiency, chronic diastolic heart failure, dementia, and breast cancer that presents to the clinic for a 60-monthhistory of nonhealing ulcer to the left lower extremity. She states over the past 2 months the site has become more painful And is weeping more. She visited her primary care office on 10/17 and was prescribed doxycycline for cellulitis of the left lower extremity. It is unclear if this has helped her symptoms. She has home health and they have been using Medihoney under compression therapy. They come out twice weekly. Her ABIs in office were 1.08. Currently she denies systemic signs of infection. 10/31; patient presents for follow-up. Patient grew high levels of Pseudomonas aeruginosa, Staphylococcus  aureus and Enterococcus bacillus from pcr culture at last clinic visit. [facility] antibiotic ointment has been ordered for the patient, she has not yet received this. She has tolerated the wrap well. 11/14; patient presents for follow-up. She started using [facility] antibiotic spray over the past 1 to 2 weeks. She has reported significant improvement in wound healing. She denies any pain. She has a small wound remaining to the dorsal aspect of her foot. Electronic Signature(s) Signed: 09/30/2022  2:06:49 PM By: Kalman Shan DO Entered By: Kalman Shan on 09/30/2022 13:03:49 -------------------------------------------------------------------------------- Physical Exam Details Patient Name: Date of Service: [name], DO [place] 09/30/2022 10:45 A M Medical Record Number: [id number] Patient Account Number: [id number] Date of Birth/Sex: Treating RN: 06-Jul-1918 (88 y.o. F) Primary Care Provider: Marlowe Sax Other Clinician: Referring Provider: Treating Provider/Extender: Kalman Shan Ngetich, Dinah Weeks in Treatment: 3 Constitutional respirations regular, non-labored and within target range for patient.. Cardiovascular 2+ dorsalis pedis/posterior tibialis pulses. Psychiatric pleasant and cooperative. Notes Left lower extremity: Small open wound with nonviable tissue to the dorsal aspect of the left foot. Significant flaking dried devitalized tissue. No signs of infection including increased warmth, erythema or purulent drainage. Good edema control. Lymphedema skin changes noted Electronic Signature(s) JAYLNN, ULLERY ([id number]) [id number] Page 3 of 8 Signed: 09/30/2022 2:06:49 PM By: Kalman Shan DO Entered By: Kalman Shan on 09/30/2022 13:04:43 -------------------------------------------------------------------------------- Physician Orders Details Patient Name: Date of Service: [name], DO [place] 09/30/2022 10:45 A M Medical  Record Number: [id number] Patient Account Number: [id number] Date of Birth/Sex: Treating RN: 21-Dec-1917 (88 y.o. [name], [name] Primary Care Provider: Marlowe Sax Other Clinician: Referring Provider: Treating Provider/Extender: Kalman Shan Ngetich, Dinah Weeks in Treatment: 3 Verbal / Phone Orders: No Diagnosis Coding ICD-10 Coding Code Description [phone] Chronic venous hypertension (idiopathic) with ulcer of left lower extremity L97.822 Non-pressure chronic ulcer of other part of left lower leg with fat layer exposed T34.28 Chronic diastolic (congestive) heart failure I10 Essential (primary) hypertension Z85.3 Personal history of malignant neoplasm of breast Follow-up Appointments ppointment in 1 week. - Dr. Heber Bonduel Return A Other: - Home health to continue to spray topical antibiotics [facility] pharmacy) on left foot and leg. patient to need bring in compression stockings for left leg at next appt. Anesthetic (In clinic) Topical Lidocaine 5% applied to wound bed Bathing/ Shower/ Hygiene May shower with protection but do not get wound dressing(s) wet. Edema Control - Lymphedema / SCD / Other Elevate legs to the level of the heart or above for 30 minutes daily and/or when sitting, a frequency of: Avoid standing for long periods of time. Home Health New wound care orders this week; continue Home Health for wound care. May utilize formulary equivalent dressing for wound treatment orders unless otherwise specified. - [facility] to change 2 x a week- Apply topical antibiotics to apply directly to left foot and leg. Wound Treatment Wound #1 - Lower Leg Wound Laterality: Left, Circumferential Cleanser: Soap and Water (Home Health) 3 x Per Week/15 Days Discharge Instructions: May shower and wash wound with dial antibacterial soap and water prior to dressing change. Peri-Wound Care: Triamcinolone 15 (g) 3 x Per Week/15 Days Discharge Instructions: apply only in clinic to left  foot and leg. Topical: [facility] topical antibiotics (Home Health) 3 x Per Week/15 Days Discharge Instructions: Once arrives apply directly to wounds to left foot and leg under the alginate Ag. Prim Dressing: KerraCel Ag Gelling Fiber Dressing, 4x5 in (silver alginate) (Home Health) 3 x Per Week/15 Days ary Discharge Instructions: Apply silver alginate to wound bed as instructed Secondary Dressing: ABD Pad, 5x9 ([place]) 3 x Per Week/15 Days Discharge Instructions: Apply over primary dressing as directed. Compression Wrap: Kerlix Roll 4.5x3.1 (in/yd) (Home Health) 3 x Per Week/15 Days Discharge Instructions: Lightly Apply Kerlix and Coban compression as directed. Compression Wrap: Coban Self-Adherent Wrap 4x5 (in/yd) (Home Health) 3 x Per Week/15 Days Discharge Instructions: Lightly Apply over Kerlix as directed. ATTIE, NAWABI ([id number]) [id number]  Page 4 of 8 Electronic Signature(s) Signed: 09/30/2022 2:06:49 PM By: Kalman Shan DO Entered By: Kalman Shan on 09/30/2022 13:04:51 -------------------------------------------------------------------------------- Problem List Details Patient Name: Date of Service: [name], DO [place] 09/30/2022 10:45 A M Medical Record Number: [id number] Patient Account Number: [id number] Date of Birth/Sex: Treating RN: 01-15-1918 (88 y.o. Heather [name] Primary Care Provider: Marlowe Sax Other Clinician: Referring Provider: Treating Provider/Extender: Grier Rocher, Dinah Weeks in Treatment: 3 Active Problems ICD-10 Encounter Code Description Active Date MDM Diagnosis I87.312 Chronic venous hypertension (idiopathic) with ulcer of left lower extremity 09/08/2022 No Yes L97.822 Non-pressure chronic ulcer of other part of left lower leg with fat layer 09/08/2022 No Yes exposed V78.46 Chronic diastolic (congestive) heart failure 09/08/2022 No Yes I10 Essential (primary)  hypertension 09/08/2022 No Yes Z85.3 Personal history of malignant neoplasm of breast 09/08/2022 No Yes Inactive Problems Resolved Problems Electronic Signature(s) Signed: 09/30/2022 2:06:49 PM By: Kalman Shan DO Entered By: Kalman Shan on 09/30/2022 13:03:05 -------------------------------------------------------------------------------- Progress Note Details Patient Name: Date of Service: [name], DO [place] 09/30/2022 10:45 A M Medical Record Number: [id number] Patient Account Number: [id number] Date of Birth/Sex: Treating RN: [birth year] (88 y.o. F) Primary Care Provider: Marlowe Sax Other Clinician: Referring Provider: Treating Provider/Extender: Grier Rocher, Dinah Weeks in Treatment: [place], [name] ([id number]) [id number] Page 5 of 8 Chief Complaint Information obtained from Patient 09/08/2022; left lower extremity wound History of Present Illness (HPI) Admission 09/08/2022 Mr. Azjah Pardo is a 88 year old female with a past medical history of venous insufficiency, chronic diastolic heart failure, dementia, and breast cancer that presents to the clinic for a 68-monthhistory of nonhealing ulcer to the left lower extremity. She states over the past 2 months the site has become more painful And is weeping more. She visited her primary care office on 10/17 and was prescribed doxycycline for cellulitis of the left lower extremity. It is unclear if this has helped her symptoms. She has home health and they have been using Medihoney under compression therapy. They come out twice weekly. Her ABIs in office were 1.08. Currently she denies systemic signs of infection. 10/31; patient presents for follow-up. Patient grew high levels of Pseudomonas aeruginosa, Staphylococcus aureus and Enterococcus bacillus from pcr culture at last clinic visit. Keystone antibiotic ointment has been ordered for the patient, she has not  yet received this. She has tolerated the wrap well. 11/14; patient presents for follow-up. She started using Keystone antibiotic spray over the past 1 to 2 weeks. She has reported significant improvement in wound healing. She denies any pain. She has a small wound remaining to the dorsal aspect of her foot. Patient History Social History Never smoker, Marital Status - Widowed, Alcohol Use - Never, Drug Use - No History, Caffeine Use - Never. Medical History Cardiovascular Patient has history of Hypertension, Peripheral Venous Disease Hospitalization/Surgery History - left mastectomy. Medical A Surgical History Notes nd Gastrointestinal laryngopharyngeal reflux disease diverticulosis Musculoskeletal osteoporosis carpel tunnel cervical spondylosis DDD Objective Constitutional respirations regular, non-labored and within target range for patient.. Vitals Time Taken: 11:26 AM, Height: 59 in, Weight: 100 lbs, BMI: 20.2, Temperature: 98 F, Pulse: 72 bpm, Respiratory Rate: 18 breaths/min, Blood Pressure: 96/55 mmHg. Cardiovascular 2+ dorsalis pedis/posterior tibialis pulses. Psychiatric pleasant and cooperative. General Notes: Left lower extremity: Small open wound with nonviable tissue to the dorsal aspect of the left foot. Significant flaking dried devitalized tissue. No signs of infection including increased warmth, erythema or purulent drainage. Good edema control. Lymphedema skin  changes noted Integumentary (Hair, Skin) Wound #1 status is Open. Original cause of wound was Skin T ear/Laceration. The date acquired was: 09/08/2020. The wound has been in treatment 3 weeks. The wound is located on the Left,Circumferential Lower Leg. The wound measures 0.1cm length x 0.1cm width x 0.1cm depth; 0.008cm^2 area and 0.001cm^3 volume. There is no tunneling or undermining noted. There is a large amount of serous drainage noted. The wound margin is distinct with the outline attached to the wound  base. There is large (67-100%) granulation within the wound bed. There is no necrotic tissue within the wound bed. The periwound skin appearance exhibited: Dry/Scaly. The periwound skin appearance did not exhibit: Callus, Crepitus, Excoriation, Induration, Rash, Scarring, Maceration, Atrophie Blanche, Cyanosis, Ecchymosis, Hemosiderin Staining, Mottled, Pallor, Rubor, Erythema. Periwound temperature was noted as No Abnormality. The periwound has tenderness on palpation. Assessment Active Problems ICD-10 Chronic venous hypertension (idiopathic) with ulcer of left lower extremity Non-pressure chronic ulcer of other part of left lower leg with fat layer exposed Chronic diastolic (congestive) heart failure Essential (primary) hypertension Personal history of malignant neoplasm of breast PARISSA, CHIAO ([id number]) [id number] Page 6 of 8 Patient has shown significant improvement in wound healing with the use of Keystone antibiotic spray. She still has a remaining wound to the dorsal aspect of the left foot and I recommended silver alginate and continuing Keystone antibiotic spray. Continue compression therapy. She states she has compression stockings at home and I recommended she bring these to next clinic visit. Follow-up in 1 week. Procedures Wound #1 Pre-procedure diagnosis of Wound #1 is a Venous Leg Ulcer located on the Left,Circumferential Lower Leg .Severity of Tissue Pre Debridement is: Limited to breakdown of skin. There was a Selective/Open Wound Skin/Epidermis Debridement with a total area of 16 sq cm performed by Kalman Shan, DO. With the following instrument(s): Curette to remove Non-Viable tissue/material. Material removed includes Callus and Skin: Epidermis and. A time out was conducted at 11:55, prior to the start of the procedure. There was no bleeding. The procedure was tolerated well with a pain level of 0 throughout and a pain level of  0 following the procedure. Post Debridement Measurements: 0.1cm length x 0.1cm width x 0.1cm depth; 0.001cm^3 volume. Character of Wound/Ulcer Post Debridement is improved. Severity of Tissue Post Debridement is: Limited to breakdown of skin. Post procedure Diagnosis Wound #1: Same as Pre-Procedure Plan Follow-up Appointments: Return Appointment in 1 week. - Dr. Heber Woodward Other: - Home health to continue to spray topical antibiotics [facility] pharmacy) on left foot and leg. patient to need bring in compression stockings for left leg at next appt. Anesthetic: (In clinic) Topical Lidocaine 5% applied to wound bed Bathing/ Shower/ Hygiene: May shower with protection but do not get wound dressing(s) wet. Edema Control - Lymphedema / SCD / Other: Elevate legs to the level of the heart or above for 30 minutes daily and/or when sitting, a frequency of: Avoid standing for long periods of time. Home Health: New wound care orders this week; continue Home Health for wound care. May utilize formulary equivalent dressing for wound treatment orders unless otherwise specified. - [facility] to change 2 x a week- Apply topical antibiotics to apply directly to left foot and leg. WOUND #1: - Lower Leg Wound Laterality: Left, Circumferential Cleanser: Soap and Water (Home Health) 3 x Per Week/15 Days Discharge Instructions: May shower and wash wound with dial antibacterial soap and water prior to dressing change. Peri-Wound Care: Triamcinolone 15 (g) 3 x Per Week/15  Days Discharge Instructions: apply only in clinic to left foot and leg. Topical: [facility] topical antibiotics (Home Health) 3 x Per Week/15 Days Discharge Instructions: Once arrives apply directly to wounds to left foot and leg under the alginate Ag. Prim Dressing: KerraCel Ag Gelling Fiber Dressing, 4x5 in (silver alginate) (Home Health) 3 x Per Week/15 Days ary Discharge Instructions: Apply silver alginate to wound bed as  instructed Secondary Dressing: ABD Pad, 5x9 ([place]) 3 x Per Week/15 Days Discharge Instructions: Apply over primary dressing as directed. Com pression Wrap: Kerlix Roll 4.5x3.1 (in/yd) (Home Health) 3 x Per Week/15 Days Discharge Instructions: Lightly Apply Kerlix and Coban compression as directed. Com pression Wrap: Coban Self-Adherent Wrap 4x5 (in/yd) (Home Health) 3 x Per Week/15 Days Discharge Instructions: Lightly Apply over Kerlix as directed. 1. [facility] antibiotic spray with silver alginate under Kerlix/Coban 2. Follow-up in 1 week Electronic Signature(s) Signed: 09/30/2022 2:06:49 PM By: Kalman Shan DO Entered By: Kalman Shan on 09/30/2022 13:05:40 -------------------------------------------------------------------------------- HxROS Details Patient Name: Date of Service: [name], DO [place] 09/30/2022 10:45 A M Medical Record Number: [id number] Patient Account Number: [id number] Date of Birth/Sex: Treating RN: May 16, 1918 (88 y.o. F) Primary Care Provider: Marlowe Sax Other Clinician: Referring Provider: Treating Provider/Extender: Grier Rocher, Dinah Weeks in Treatment: [place], [name] ([id number]) [id number] Page 7 of 8 Cardiovascular Medical History: Positive for: Hypertension; Peripheral Venous Disease Gastrointestinal Medical History: Past Medical History Notes: laryngopharyngeal reflux disease diverticulosis Musculoskeletal Medical History: Past Medical History Notes: osteoporosis carpel tunnel cervical spondylosis DDD Immunizations Pneumococcal Vaccine: Received Pneumococcal Vaccination: Yes Received Pneumococcal Vaccination On or After 60th Birthday: Yes Implantable Devices No devices added Hospitalization / Surgery History Type of Hospitalization/Surgery left mastectomy Family and Social History Never smoker; Marital Status - Widowed; Alcohol Use: Never; Drug Use: No History; Caffeine  Use: Never; Financial Concerns: No; Food, Clothing or Shelter Needs: No; Support System Lacking: No Electronic Signature(s) Signed: 09/30/2022 2:06:49 PM By: Kalman Shan DO Entered By: Kalman Shan on 09/30/2022 13:03:54 -------------------------------------------------------------------------------- SuperBill Details Patient Name: Date of Service: [name], DO [place]. 09/30/2022 Medical Record Number: [id number] Patient Account Number: [id number] Date of Birth/Sex: Treating RN: 1918-07-10 (88 y.o. [name], Tammi Klippel Primary Care Provider: Marlowe Sax Other Clinician: Referring Provider: Treating Provider/Extender: Kalman Shan Ngetich, Dinah Weeks in Treatment: 3 Diagnosis Coding ICD-10 Codes Code Description [phone] Chronic venous hypertension (idiopathic) with ulcer of left lower extremity L97.822 Non-pressure chronic ulcer of other part of left lower leg with fat layer exposed V69.79 Chronic diastolic (congestive) heart failure I10 Essential (primary) hypertension Z85.3 Personal history of malignant neoplasm of breast Facility Procedures : KARLYE, IHRIG Code: [phone] KASHAWNA MANZER [phone] ICD- Description: 78675 - DEBRIDE WOUND 1ST 20 SQ CM OR < 330) [id number] 10 Diagnosis Description L97.822 Non-pressure chronic ulcer of other part of left lower leg with fat layer expo Modifier: [id number] Quantity: 1 [id number] Page 8 of 8 Physician Procedures : CPT4 Code Description Modifier [id number] - WC PHYS DEBR WO ANESTH 20 SQ CM ICD-10 Diagnosis Description L97.822 Non-pressure chronic ulcer of other part of left lower leg with fat layer exposed Quantity: 1 Electronic Signature(s) Signed: 09/30/2022 2:06:49 PM By: Kalman Shan DO Entered By: Kalman Shan on 09/30/2022 13:05:47

## 2022-10-01 NOTE — Progress Notes (Signed)
Heather, [name] ([id number]) [id number] Page 1 of 8 Visit Report for 09/30/2022 Arrival Information Details Patient Name: Date of Service: Heather [name] 09/30/2022 10:45 A M Medical Record Number: [id number] Patient Account Number: [id number] Date of Birth/Sex: Treating RN: [birth year] (88 y.o. F) Primary Care Bradly Sangiovanni: Marlowe Sax Other Clinician: Referring Achol Azpeitia: Treating Rowyn Spilde/Extender: Kalman Shan Ngetich, Dinah Weeks in Treatment: 3 Visit Information History Since Last Visit Added or deleted any medications: No Patient Arrived: Wheel Chair Any new allergies or adverse reactions: No Arrival Time: 11:19 Had a fall or experienced change in No Accompanied By: son activities of daily living that may affect Transfer Assistance: Manual risk of falls: Patient Identification Verified: Yes Signs or symptoms of abuse/neglect since last visito No Secondary Verification Process Completed: Yes Hospitalized since last visit: No Patient Requires Transmission-Based Precautions: No Implantable device outside of the clinic excluding No Patient Has Alerts: No cellular tissue based products placed in the center since last visit: Has Compression in Place as Prescribed: Yes Pain Present Now: Yes Electronic Signature(s) Signed: 09/30/2022 3:56:34 PM By: Erenest Blank Entered By: Erenest Blank on 09/30/2022 11:25:56 -------------------------------------------------------------------------------- Encounter Discharge Information Details Patient Name: Date of Service: Heather Carwin, DO [place]. 09/30/2022 10:45 A M Medical Record Number: [id number] Patient Account Number: [id number] Date of Birth/Sex: Treating RN: 1918/03/20 (88 y.o. [name] Primary Care Jone Panebianco: Marlowe Sax Other Clinician: Referring Sumaya Riedesel: Treating Madaleine Simmon/Extender: Kalman Shan Ngetich, Dinah Weeks in Treatment: 3 Encounter Discharge Information Items Post  Procedure Vitals Discharge Condition: Stable Temperature (F): 98 Ambulatory Status: Wheelchair Pulse (bpm): 72 Discharge Destination: Home Respiratory Rate (breaths/min): 20 Transportation: Private Auto Blood Pressure (mmHg): 96/55 Accompanied By: son Schedule Follow-up Appointment: Yes Clinical Summary of Care: Electronic Signature(s) Signed: 09/30/2022 5:53:09 PM By: Deon Pilling RN, BSN Entered By: Deon Pilling on 09/30/2022 12:02:36 [name] ([id number]) [id number] Page 2 of 8 -------------------------------------------------------------------------------- Lower Extremity Assessment Details Patient Name: Date of Service: Heather [name] 09/30/2022 10:45 A M Medical Record Number: [id number] Patient Account Number: [id number] Date of Birth/Sex: Treating RN: Sep 05, 1918 (88 y.o. F) Primary Care Jazalynn Mireles: Marlowe Sax Other Clinician: Referring Judyann Casasola: Treating Kellyjo Edgren/Extender: Kalman Shan Ngetich, Dinah Weeks in Treatment: 3 Edema Assessment Assessed: [Left: No] [Right: No] Edema: [Left: Ye] [Right: s] Calf Left: Right: Point of Measurement: 32 cm From Medial Instep 30 cm Ankle Left: Right: Point of Measurement: 10 cm From Medial Instep 24.5 cm Electronic Signature(s) Signed: 09/30/2022 3:56:34 PM By: Erenest Blank Entered By: Erenest Blank on 09/30/2022 11:38:25 -------------------------------------------------------------------------------- Multi Wound Chart Details Patient Name: Date of Service: Heather Carwin, DO [place] 09/30/2022 10:45 A M Medical Record Number: [id number] Patient Account Number: [id number] Date of Birth/Sex: Treating RN: [birth year] (88 y.o. F) Primary Care Maija Biggers: Marlowe Sax Other Clinician: Referring Jo Booze: Treating Samael Blades/Extender: Kalman Shan Ngetich, Dinah Weeks in Treatment: 3 Vital Signs Height(in): 59 Pulse(bpm): 72 Weight(lbs): 100 Blood Pressure(mmHg):  96/55 Body Mass Index(BMI): 20.2 Temperature(F): 98 Respiratory Rate(breaths/min): 18 [1:Photos:] [N/A:N/A [id number] Page 3 of 8] Left, Circumferential Lower Leg N/A N/A Wound Location: Skin T ear/Laceration N/A N/A Wounding Event: Venous Leg Ulcer N/A N/A Primary Etiology: Lymphedema N/A N/A Secondary Etiology: Hypertension, Peripheral Venous N/A N/A Comorbid History: Disease 09/08/2020 N/A N/A Date Acquired: 3 N/A N/A Weeks of Treatment: Open N/A N/A Wound Status: No N/A N/A Wound Recurrence: Yes N/A N/A Clustered Wound: 2 N/A N/A Clustered Quantity: 0.1x0.1x0.1 N/A N/A Measurements L x W x D (cm) 0.008 N/A N/A A (cm) : rea 0.001 N/A  N/A Volume (cm) : 100.00% N/A N/A % Reduction in A rea: 100.00% N/A N/A % Reduction in Volume: Full Thickness Without Exposed N/A N/A Classification: Support Structures Large N/A N/A Exudate A mount: Serous N/A N/A Exudate Type: amber N/A N/A Exudate Color: Distinct, outline attached N/A N/A Wound Margin: Large (67-100%) N/A N/A Granulation A mount: None Present (0%) N/A N/A Necrotic A mount: Fascia: No N/A N/A Exposed Structures: Fat Layer (Subcutaneous Tissue): No Tendon: No Muscle: No Joint: No Bone: No None N/A N/A Epithelialization: Debridement - Selective/Open Wound N/A N/A Debridement: Pre-procedure Verification/Time Out 11:55 N/A N/A Taken: Callus N/A N/A Tissue Debrided: Skin/Epidermis N/A N/A Level: 16 N/A N/A Debridement A (sq cm): rea Curette N/A N/A Instrument: None N/A N/A Bleeding: 0 N/A N/A Procedural Pain: 0 N/A N/A Post Procedural Pain: Procedure was tolerated well N/A N/A Debridement Treatment Response: 0.1x0.1x0.1 N/A N/A Post Debridement Measurements L x W x D (cm) 0.001 N/A N/A Post Debridement Volume: (cm) Excoriation: No N/A N/A Periwound Skin Texture: Induration: No Callus: No Crepitus: No Rash: No Scarring: No Dry/Scaly: Yes N/A  N/A Periwound Skin Moisture: Maceration: No Atrophie Blanche: No N/A N/A Periwound Skin Color: Cyanosis: No Ecchymosis: No Erythema: No Hemosiderin Staining: No Mottled: No Pallor: No Rubor: No No Abnormality N/A N/A Temperature: Yes N/A N/A Tenderness on Palpation: Debridement N/A N/A Procedures Performed: Treatment Notes Wound #1 (Lower Leg) Wound Laterality: Left, Circumferential Cleanser GRAYCEN, DEGAN ([id number]) [phone].pdf Page 4 of 8 Soap and Water Discharge Instruction: May shower and wash wound with dial antibacterial soap and water prior to dressing change. Peri-Wound Care Triamcinolone 15 (g) Discharge Instruction: apply only in clinic to left foot and leg. Topical [facility]'s topical antibiotics Discharge Instruction: Once arrives apply directly to wounds to left foot and leg under the alginate Ag. Primary Dressing KerraCel Ag Gelling Fiber Dressing, 4x5 in (silver alginate) Discharge Instruction: Apply silver alginate to wound bed as instructed Secondary Dressing ABD Pad, 5x9 Discharge Instruction: Apply over primary dressing as directed. Secured With Compression Wrap Kerlix Roll 4.5x3.1 (in/yd) Discharge Instruction: Lightly Apply Kerlix and Coban compression as directed. Coban Self-Adherent Wrap 4x5 (in/yd) Discharge Instruction: Lightly Apply over Kerlix as directed. Compression Stockings Add-Ons Electronic Signature(s) Signed: 09/30/2022 2:06:49 PM By: Kalman Shan DO Entered By: Kalman Shan on 09/30/2022 13:03:14 -------------------------------------------------------------------------------- Multi-Disciplinary Care Plan Details Patient Name: Date of Service: Heather Carwin, DO [place] 09/30/2022 10:45 A M Medical Record Number: [id number] Patient Account Number: [id number] Date of Birth/Sex: Treating RN: 12/14/1917 (88 y.o. [name] Primary Care Cornel Werber: Marlowe Sax Other  Clinician: Referring Jaquel Coomer: Treating Morrisa Aldaba/Extender: Kalman Shan Ngetich, Dinah Weeks in Treatment: 3 Active Inactive Wound/Skin Impairment Nursing Diagnoses: Impaired tissue integrity Knowledge deficit related to ulceration/compromised skin integrity Goals: Patient will have a decrease in wound volume by X% from date: (specify in notes) Date Initiated: 09/08/2022 Target Resolution Date: 11/06/2022 Goal Status: Active Patient/caregiver will verbalize understanding of skin care regimen Date Initiated: 09/08/2022 Target Resolution Date: 11/13/2022 Goal Status: Active Ulcer/skin breakdown will have a volume reduction of 30% by week 4 Date Initiated: 09/08/2022 Target Resolution Date: 10/10/2022 Goal Status: Active Ulcer/skin breakdown will have a volume reduction of 50% by week 8 Date Initiated: 09/08/2022 Target Resolution Date: 11/06/2022 SURIE, SUCHOCKI ([id number]) [phone].pdf Page 5 of 8 Goal Status: Active Interventions: Assess patient/caregiver ability to obtain necessary supplies Assess patient/caregiver ability to perform ulcer/skin care regimen upon admission and as needed Assess ulceration(s) every visit Notes: Electronic Signature(s) Signed: 09/30/2022 5:53:09 PM By:  Deon Pilling RN, BSN Entered By: Deon Pilling on 09/30/2022 11:26:45 -------------------------------------------------------------------------------- Pain Assessment Details Patient Name: Date of Service: Heather [name] 09/30/2022 10:45 A M Medical Record Number: [id number] Patient Account Number: [id number] Date of Birth/Sex: Treating RN: April 27, 1918 (88 y.o. F) Primary Care Deaunna Olarte: Marlowe Sax Other Clinician: Referring Carden Teel: Treating Jensyn Cambria/Extender: Kalman Shan Ngetich, Dinah Weeks in Treatment: 3 Active Problems Location of Pain Severity and Description of Pain Patient Has Paino Yes Site Locations Pain Location: Generalized  Pain, Pain in Ulcers Rate the pain. Current Pain Level: 2 Pain Management and Medication Current Pain Management: Electronic Signature(s) Signed: 09/30/2022 3:56:34 PM By: Erenest Blank Entered By: Erenest Blank on 09/30/2022 11:26:34 -------------------------------------------------------------------------------- Patient/Caregiver Education Details Patient Name: Date of Service: Heather Carwin, DO [place] 11/14/2023andnbsp10:45 [place] Record Number: [id number] Patient Account Number: [id number] Date of Birth/Gender: Treating RN: July 20, 1918 (88 y.o. [address] [place], [name] ([id number]) [id number] Page 6 of 8 Primary Care Physician: Marlowe Sax Other Clinician: Referring Physician: Treating Physician/Extender: Elsworth Soho in Treatment: 3 Education Assessment Education Provided To: Patient Education Topics Provided Wound/Skin Impairment: Handouts: Skin Care Do's and Dont's Methods: Explain/Verbal Responses: Reinforcements needed Electronic Signature(s) Signed: 09/30/2022 5:53:09 PM By: Deon Pilling RN, BSN Entered By: Deon Pilling on 09/30/2022 11:26:56 -------------------------------------------------------------------------------- Wound Assessment Details Patient Name: Date of Service: Heather [name], DO [place] 09/30/2022 10:45 A M Medical Record Number: [id number] Patient Account Number: [id number] Date of Birth/Sex: Treating RN: 1918-08-21 (88 y.o. F) Primary Care Torie Towle: Marlowe Sax Other Clinician: Referring Chizaram Latino: Treating Yvett Rossel/Extender: Kalman Shan Ngetich, Dinah Weeks in Treatment: 3 Wound Status Wound Number: 1 Primary Etiology: Venous Leg Ulcer Wound Location: Left, Circumferential Lower Leg Secondary Etiology: Lymphedema Wounding Event: Skin Tear/Laceration Wound Status: Open Date Acquired: 09/08/2020 Comorbid History: Hypertension, Peripheral Venous Disease Weeks Of  Treatment: 3 Clustered Wound: Yes Photos Wound Measurements Length: (cm) Width: (cm) Depth: (cm) Clustered Quantity: Area: (cm) Volume: (cm) 0.1 % Reduction in Area: 100% 0.1 % Reduction in Volume: 100% 0.1 Epithelialization: None 2 Tunneling: No 0.008 Undermining: No 0.001 Wound Description Classification: Full Thickness Without Exposed Support Structures Wound Margin: Distinct, outline attached Exudate Amount: Large Exudate Type: Serous SHAYLAN, TUTTON ([id number]) Exudate Color: amber Foul Odor After Cleansing: No Slough/Fibrino Yes [id number] Page 7 of 8 Wound Bed Granulation Amount: Large (67-100%) Exposed Structure Necrotic Amount: None Present (0%) Fascia Exposed: No Fat Layer (Subcutaneous Tissue) Exposed: No Tendon Exposed: No Muscle Exposed: No Joint Exposed: No Bone Exposed: No Periwound Skin Texture Texture Color No Abnormalities Noted: No No Abnormalities Noted: No Callus: No Atrophie Blanche: No Crepitus: No Cyanosis: No Excoriation: No Ecchymosis: No Induration: No Erythema: No Rash: No Hemosiderin Staining: No Scarring: No Mottled: No Pallor: No Moisture Rubor: No No Abnormalities Noted: No Dry / Scaly: Yes Temperature / Pain Maceration: No Temperature: No Abnormality Tenderness on Palpation: Yes Treatment Notes Wound #1 (Lower Leg) Wound Laterality: Left, Circumferential Cleanser Soap and Water Discharge Instruction: May shower and wash wound with dial antibacterial soap and water prior to dressing change. Peri-Wound Care Triamcinolone 15 (g) Discharge Instruction: apply only in clinic to left foot and leg. Topical [facility]'s topical antibiotics Discharge Instruction: Once arrives apply directly to wounds to left foot and leg under the alginate Ag. Primary Dressing KerraCel Ag Gelling Fiber Dressing, 4x5 in (silver alginate) Discharge Instruction: Apply silver alginate to wound bed as  instructed Secondary Dressing ABD Pad, 5x9 Discharge Instruction: Apply over primary dressing as directed. Secured With  Compression Wrap Kerlix Roll 4.5x3.1 (in/yd) Discharge Instruction: Lightly Apply Kerlix and Coban compression as directed. Coban Self-Adherent Wrap 4x5 (in/yd) Discharge Instruction: Lightly Apply over Kerlix as directed. Compression Stockings Add-Ons Electronic Signature(s) Signed: 09/30/2022 3:56:34 PM By: Erenest Blank Entered By: Erenest Blank on 09/30/2022 [place], [name] ([id number]) [id number] Page 8 of 8 -------------------------------------------------------------------------------- Vitals Details Patient Name: Date of Service: Heather [name] 09/30/2022 10:45 A M Medical Record Number: [id number] Patient Account Number: [id number] Date of Birth/Sex: Treating RN: [birth year] (88 y.o. F) Primary Care Joshawa Dubin: Marlowe Sax Other Clinician: Referring Elijiah Mickley: Treating Bralin Garry/Extender: Kalman Shan Ngetich, Dinah Weeks in Treatment: 3 Vital Signs Time Taken: 11:26 Temperature (F): 98 Height (in): 59 Pulse (bpm): 72 Weight (lbs): 100 Respiratory Rate (breaths/min): 18 Body Mass Index (BMI): 20.2 Blood Pressure (mmHg): 96/55 Reference Range: 80 - 120 mg / dl Electronic Signature(s) Signed: 09/30/2022 3:56:34 PM By: Erenest Blank Entered By: Erenest Blank on 09/30/2022 11:26:21

## 2022-10-07 ENCOUNTER — Encounter (HOSPITAL_BASED_OUTPATIENT_CLINIC_OR_DEPARTMENT_OTHER): Payer: Medicare Other | Admitting: Internal Medicine

## 2022-10-07 DIAGNOSIS — L97822 Non-pressure chronic ulcer of other part of left lower leg with fat layer exposed: Secondary | ICD-10-CM | POA: Diagnosis not present

## 2022-10-07 DIAGNOSIS — I87312 Chronic venous hypertension (idiopathic) with ulcer of left lower extremity: Secondary | ICD-10-CM

## 2022-10-07 DIAGNOSIS — I5032 Chronic diastolic (congestive) heart failure: Secondary | ICD-10-CM

## 2022-10-07 DIAGNOSIS — I10 Essential (primary) hypertension: Secondary | ICD-10-CM | POA: Diagnosis not present

## 2022-10-07 NOTE — Progress Notes (Signed)
Heather [name] ([id number]) [id number] Page 1 of 6 Visit Report for 10/07/2022 Chief Complaint Document Details Patient Name: Date of Service: HA Heather [name] 10/07/2022 2:30 PM Medical Record Number: [id number] Patient Account Number: [id number] Date of Birth/Sex: Treating RN: [birth year] (88 y.o. F) Primary Care Provider: Marlowe Sax Other Clinician: Referring Provider: Treating Provider/Extender: Grier Rocher, Dinah Weeks in Treatment: 4 Information Obtained from: Patient Chief Complaint 09/08/2022; left lower extremity wound Electronic Signature(s) Signed: 10/07/2022 3:46:12 PM By: Kalman Shan DO Entered By: Kalman Shan on 10/07/2022 15:42:40 -------------------------------------------------------------------------------- HPI Details Patient Name: Date of Service: [name], [name] 10/07/2022 2:30 PM Medical Record Number: [id number] Patient Account Number: [id number] Date of Birth/Sex: Treating RN: [birth year] (88 y.o. F) Primary Care Provider: Marlowe Sax Other Clinician: Referring Provider: Treating Provider/Extender: Grier Rocher, Dinah Weeks in Treatment: 4 History of Present Illness HPI Description: Admission 09/08/2022 Mr. Jagger Beahm is a 88 year old female with a past medical history of venous insufficiency, chronic diastolic heart failure, dementia, and breast cancer that presents to the clinic for a 37-monthhistory of nonhealing ulcer to the left lower extremity. She states over the past 2 months the site has become more painful And is weeping more. She visited her primary care office on 10/17 and was prescribed doxycycline for cellulitis of the left lower extremity. It is unclear if this has helped her symptoms. She has home health and they have been using Medihoney under compression therapy. They come out twice weekly. Her ABIs in office were 1.08. Currently she denies systemic signs  of infection. 10/31; patient presents for follow-up. Patient grew high levels of Pseudomonas aeruginosa, Staphylococcus aureus and Enterococcus bacillus from pcr culture at last clinic visit. Keystone antibiotic ointment has been ordered for the patient, she has not yet received this. She has tolerated the wrap well. 11/14; patient presents for follow-up. She started using Keystone antibiotic spray over the past 1 to 2 weeks. She has reported significant improvement in wound healing. She denies any pain. She has a small wound remaining to the dorsal aspect of her foot. 11/21; patient presents for follow-up. At last clinic visit [facility] with silver alginate under Kerlix/Coban. She states she ordered her compression stockings and they are in the mail. Her wounds have healed. Electronic Signature(s) Signed: 10/07/2022 3:46:12 PM By: HKalman ShanDO Entered By: HKalman Shanon 10/07/2022 15:43:25 Heckert, DCyndy Freeze([id number] Page 2 of 6 -------------------------------------------------------------------------------- Physical Exam Details Patient Name: Date of Service: HA Heather [name] 10/07/2022 2:30 PM Medical Record Number: [id number] Account Number: [id number] of Birth/Sex: Treating RN: 7Nov 20, 1919(88y.o. F) Primary Care Provider: NMarlowe SaxOther Clinician: Referring Provider: Treating Provider/Extender: HKalman ShanNgetich, Dinah Weeks in Treatment: 4 Constitutional respirations regular, non-labored and within target range for patient.. Cardiovascular 2+ dorsalis pedis/posterior tibialis pulses. Psychiatric pleasant and cooperative. Notes Left lower extremity: Epithelization to the previous wound sites. Dry flaking skin throughout the leg. No open wounds. No weeping noted. Lymphedema skin changes. Good edema control. Electronic Signature(s) Signed: 10/07/2022 3:46:12 PM  By: HKalman ShanDO Entered By: HKalman Shanon 10/07/2022 15:44:16 -------------------------------------------------------------------------------- Physician Orders Details Patient Name: Date of Service: [place]St. [place]. 10/07/2022 2:30 PM Medical Record Number: [id number] Account Number: [id number] of Birth/Sex: Treating RN: [birth year](88y.o. FDebby BudPrimary Care Provider: NMarlowe SaxOther Clinician: Referring Provider: Treating Provider/Extender: HKalman ShanNgetich, Dinah Weeks in Treatment: 4 Verbal / Phone  Orders: No Diagnosis Coding Discharge From [facility] Services Discharge from [place] - Call if any future wound care needs. Tubigrip size E double layer apply in the morning and remove at night. Use until your stockings arrive then apply in the morning and remove at night. [place] [facility] for wound care. Lajean Manes Electronic Signature(s) Signed: 10/07/2022 3:46:12 PM By: Kalman Shan DO Entered By: Kalman Shan on 10/07/2022 15:44:23 -------------------------------------------------------------------------------- Problem List Details Patient Name: Date of Service: HA Heather [name] J. 10/07/2022 2:30 PM Heather [name] ([id number]) [id number] Page 3 of 6 Medical Record Number: [id number] Patient Account Number: [id number] Date of Birth/Sex: Treating RN: 12/16/1917 (88 y.o. F) Primary Care Provider: Marlowe Sax Other Clinician: Referring Provider: Treating Provider/Extender: Grier Rocher, Dinah Weeks in Treatment: 4 Active Problems ICD-10 Encounter Code Description Active Date MDM Diagnosis I87.312 Chronic venous hypertension (idiopathic) with ulcer of left lower extremity 09/08/2022 No Yes L97.822 Non-pressure chronic ulcer of other part of left lower leg with fat layer 09/08/2022 No Yes exposed C58.52 Chronic diastolic (congestive) heart  failure 09/08/2022 No Yes I10 Essential (primary) hypertension 09/08/2022 No Yes Z85.3 Personal history of malignant neoplasm of breast 09/08/2022 No Yes Inactive Problems Resolved Problems Electronic Signature(s) Signed: 10/07/2022 3:46:12 PM By: Kalman Shan DO Entered By: Kalman Shan on 10/07/2022 15:42:24 -------------------------------------------------------------------------------- Progress Note Details Patient Name: Date of Service: [name], DO [place] J. 10/07/2022 2:30 PM Medical Record Number: [id number] Patient Account Number: [id number] Date of Birth/Sex: Treating RN: August 12, 1918 (88 y.o. F) Primary Care Provider: Marlowe Sax Other Clinician: Referring Provider: Treating Provider/Extender: Grier Rocher, Dinah Weeks in Treatment: 4 Subjective Chief Complaint Information obtained from Patient 09/08/2022; left lower extremity wound History of Present Illness (HPI) Admission 09/08/2022 Mr. Catlin Doria is a 88 year old female with a past medical history of venous insufficiency, chronic diastolic heart failure, dementia, and breast cancer that presents to the clinic for a 81-monthhistory of nonhealing ulcer to the left lower extremity. She states over the past 2 months the site has become more painful And is weeping more. She visited her primary care office on 10/17 and was prescribed doxycycline for cellulitis of the left lower extremity. It is unclear if this has helped her symptoms. She has home health and they have been using Medihoney under compression therapy. They come out twice weekly. Her ABIs in office were 1.08. Currently she denies systemic signs of infection. 10/31; patient presents for follow-up. Patient grew high levels of Pseudomonas aeruginosa, Staphylococcus aureus and Enterococcus bacillus from pcr culture at last clinic visit. Keystone antibiotic ointment has been ordered for the patient, she has not yet received this. She has  tolerated the wrap well. 11/14; patient presents for follow-up. She started using Keystone antibiotic spray over the past 1 to 2 weeks. She has reported significant improvement in [place]([id number] Page 4 of 6 wound healing. She denies any pain. She has a small wound remaining to the dorsal aspect of her foot. 11/21; patient presents for follow-up. At last [facility] visit [facility] silver alginate under Kerlix/Coban. She states she ordered her compression stockings and they are in the mail. Her wounds have healed. Patient History Social History Never smoker, Marital Status - Widowed, Alcohol Use - Never, Drug Use - No History, Caffeine Use - Never. Medical History Cardiovascular Patient has history of Hypertension, Peripheral Venous Disease Hospitalization/Surgery History - left mastectomy. Medical A Surgical History Notes nd Gastrointestinal laryngopharyngeal reflux  disease diverticulosis Musculoskeletal osteoporosis carpel tunnel cervical spondylosis DDD Objective Constitutional respirations regular, non-labored and within target range for patient.. Vitals Time Taken: 2:45 PM, Height: 59 in, Weight: 100 lbs, BMI: 20.2, Temperature: 98.7 F, Pulse: 74 bpm, Respiratory Rate: 17 breaths/min, Blood Pressure: 133/74 mmHg. Cardiovascular 2+ dorsalis pedis/posterior tibialis pulses. Psychiatric pleasant and cooperative. General Notes: Left lower extremity: Epithelization to the previous wound sites. Dry flaking skin throughout the leg. No open wounds. No weeping noted. Lymphedema skin changes. Good edema control. Integumentary (Hair, Skin) Wound #1 status is Open. Original cause of wound was Skin T ear/Laceration. The date acquired was: 09/08/2020. The wound has been in treatment 4 weeks. The wound is located on the Left,Circumferential Lower Leg. The wound measures 0cm length x 0cm width x 0cm  depth; 0cm^2 area and 0cm^3 volume. There is no tunneling or undermining noted. There is a large amount of serous drainage noted. The wound margin is distinct with the outline attached to the wound base. There is large (67-100%) granulation within the wound bed. There is no necrotic tissue within the wound bed. The periwound skin appearance exhibited: Dry/Scaly. The periwound skin appearance did not exhibit: Callus, Crepitus, Excoriation, Induration, Rash, Scarring, Maceration, Atrophie Blanche, Cyanosis, Ecchymosis, Hemosiderin Staining, Mottled, Pallor, Rubor, Erythema. Periwound temperature was noted as No Abnormality. The periwound has tenderness on palpation. Assessment Active Problems ICD-10 Chronic venous hypertension (idiopathic) with ulcer of left lower extremity Non-pressure chronic ulcer of other part of left lower leg with fat layer exposed Chronic diastolic (congestive) heart failure Essential (primary) hypertension Personal history of malignant neoplasm of breast Patient has done well with [facility] antibiotic spray and compression therapy. At this time I recommended daily compression stockings. We gave her Tubigrip in office to use by the time she obtains her compression stockings. She knows to put this on daily. She may follow-up as needed. Plan Discharge From [facility] Services: Discharge from [place] - Call if any future wound care needs. Tubigrip size E double layer apply in the morning and remove at night. Use until your stockings arrive then apply in the morning and remove at night. Home Health: JNYAH, BRAZEE ([id number]) [id number] Page 5 of 6 Discontinue home health for wound care. - [facility] 1. Discharge from clinic due to closed wound 2. Daily compression stockings 3. Follow-up as needed Electronic Signature(s) Signed: 10/07/2022 3:46:12 PM By: Kalman Shan DO Entered By: Kalman Shan on 10/07/2022  15:45:05 -------------------------------------------------------------------------------- HxROS Details Patient Name: Date of Service: [place], DO [place] J. 10/07/2022 2:30 PM Medical Record Number: [id number] Patient Account Number: [id number] Date of Birth/Sex: Treating RN: [birth year] (88 y.o. F) Primary Care Provider: Marlowe Sax Other Clinician: Referring Provider: Treating Provider/Extender: Kalman Shan Ngetich, Dinah Weeks in Treatment: 4 Cardiovascular Medical History: Positive for: Hypertension; Peripheral Venous Disease Gastrointestinal Medical History: Past Medical History Notes: laryngopharyngeal reflux disease diverticulosis Musculoskeletal Medical History: Past Medical History Notes: osteoporosis carpel tunnel cervical spondylosis DDD Immunizations Pneumococcal Vaccine: Received Pneumococcal Vaccination: Yes Received Pneumococcal Vaccination On or After 60th Birthday: Yes Implantable Devices No devices added Hospitalization / Surgery History Type of Hospitalization/Surgery left mastectomy Family and Social History Never smoker; Marital Status - Widowed; Alcohol Use: Never; Drug Use: No History; Caffeine Use: Never; Financial Concerns: No; Food, Clothing or Shelter Needs: No; Support System Lacking: No Electronic Signature(s) Signed: 10/07/2022 3:46:12 PM By: Kalman Shan DO Entered By: Kalman Shan on 10/07/2022 15:43:31 Kana, [name] ([id number]) [id number] Page 6 of 6 -------------------------------------------------------------------------------- SuperBill Details Patient Name: Date of  Service: [name], DO [place] J. 10/07/2022 Medical Record Number: [id number] Patient Account Number: [id number] Date of Birth/Sex: Treating RN: 09/03/1918 (88 y.o. F) Primary Care Provider: Marlowe Sax Other Clinician: Referring Provider: Treating Provider/Extender: Grier Rocher, Dinah Weeks in Treatment:  4 Diagnosis Coding ICD-10 Codes Code Description [phone] Chronic venous hypertension (idiopathic) with ulcer of left lower extremity L97.822 Non-pressure chronic ulcer of other part of left lower leg with fat layer exposed R44.31 Chronic diastolic (congestive) heart failure I10 Essential (primary) hypertension Z85.3 Personal history of malignant neoplasm of breast Facility Procedures : CPT4 Code: [id number] Description: 99214 - WOUND CARE VISIT-LEV 4 EST PT Modifier: Quantity: 1 Physician Procedures : CPT4 Code Description Modifier [id number] - WC PHYS LEVEL 3 - EST PT ICD-10 Diagnosis Description I87.312 Chronic venous hypertension (idiopathic) with ulcer of left lower extremity L97.822 Non-pressure chronic ulcer of other part of left lower leg  with fat layer exposed P80.99 Chronic diastolic (congestive) heart failure I10 Essential (primary) hypertension Quantity: 1 Electronic Signature(s) Unsigned Previous Signature: 10/07/2022 3:46:12 PM Version By: Kalman Shan DO Entered By: Deon Pilling on 10/07/2022 16:02:38 Signature(s): Date(s):

## 2022-10-08 NOTE — Progress Notes (Signed)
AREEBAH, MEINDERS ([id number]) [id number] Page 1 of 8 Visit Report for 10/07/2022 Arrival Information Details Patient Name: Date of Service: Heather [name] 10/07/2022 2:30 PM Medical Record Number: [id number] Patient Account Number: [id number] Date of Birth/Sex: Treating RN: 08/30/1918 (88 y.o. Tonita Phoenix, Lauren Primary Care Claudis Giovanelli: Marlowe Sax Other Clinician: Referring Tanequa Kretz: Treating Remy Voiles/Extender: Kalman Shan Ngetich, Dinah Weeks in Treatment: 4 Visit Information History Since Last Visit Added or deleted any medications: No Patient Arrived: Wheel Chair Any new allergies or adverse reactions: No Arrival Time: 14:44 Had a fall or experienced change in No Accompanied By: son activities of daily living that may affect Transfer Assistance: Manual risk of falls: Patient Identification Verified: Yes Signs or symptoms of abuse/neglect since last visito No Secondary Verification Process Completed: Yes Hospitalized since last visit: No Patient Requires Transmission-Based Precautions: No Implantable device outside of the clinic excluding No Patient Has Alerts: No cellular tissue based products placed in the center since last visit: Has Dressing in Place as Prescribed: Yes Has Compression in Place as Prescribed: Yes Pain Present Now: No Electronic Signature(s) Signed: 10/07/2022 4:11:21 PM By: Rhae Hammock RN Entered By: Rhae Hammock on 10/07/2022 14:44:26 -------------------------------------------------------------------------------- Clinic Level of Care Assessment Details Patient Name: Date of Service: [place], DO [place]. 10/07/2022 2:30 PM Medical Record Number: [id number] Patient Account Number: [id number] Date of Birth/Sex: Treating RN: 03-Sep-1918 (88 y.o. [name] Primary Care Zaylen Susman: Marlowe Sax Other Clinician: Referring Aamori Mcmasters: Treating Gabrielle Mester/Extender: Kalman Shan Ngetich,  Dinah Weeks in Treatment: 4 Clinic Level of Care Assessment Items TOOL 4 Quantity Score X- 1 0 Use when only an EandM is performed on FOLLOW-UP visit ASSESSMENTS - Nursing Assessment / Reassessment X- 1 10 Reassessment of Co-morbidities (includes updates in patient status) X- 1 5 Reassessment of Adherence to Treatment Plan ASSESSMENTS - Wound and Skin A ssessment / Reassessment X - Simple Wound Assessment / Reassessment - one wound 1 5 '[]'$  - 0 Complex Wound Assessment / Reassessment - multiple wounds X- 1 10 Dermatologic / Skin Assessment (not related to wound area) ASSESSMENTS - Focused Assessment X- 1 5 Circumferential Edema Measurements - multi extremities '[]'$  - 0 Nutritional Assessment / Counseling / Intervention JALEA, BRONAUGH ([id number]) [id number] Page 2 of 8 '[]'$  - 0 Lower Extremity Assessment (monofilament, tuning fork, pulses) '[]'$  - 0 Peripheral Arterial Disease Assessment (using hand held doppler) ASSESSMENTS - Ostomy and/or Continence Assessment and Care '[]'$  - 0 Incontinence Assessment and Management '[]'$  - 0 Ostomy Care Assessment and Management (repouching, etc.) PROCESS - Coordination of Care X - Simple Patient / Family Education for ongoing care 1 15 '[]'$  - 0 Complex (extensive) Patient / Family Education for ongoing care X- 1 10 Staff obtains Programmer, systems, Records, T Results / Process Orders est X- 1 10 Staff telephones HHA, Nursing Homes / Clarify orders / etc '[]'$  - 0 Routine Transfer to another Facility (non-emergent condition) '[]'$  - 0 Routine Hospital Admission (non-emergent condition) '[]'$  - 0 New Admissions / Biomedical engineer / Ordering NPWT Apligraf, etc. , '[]'$  - 0 Emergency Hospital Admission (emergent condition) X- 1 10 Simple Discharge Coordination '[]'$  - 0 Complex (extensive) Discharge Coordination PROCESS - Special Needs '[]'$  - 0 Pediatric / Minor Patient Management '[]'$  - 0 Isolation Patient Management '[]'$  -  0 Hearing / Language / Visual special needs '[]'$  - 0 Assessment of Community assistance (transportation, D/C planning, etc.) '[]'$  - 0 Additional assistance / Altered mentation '[]'$  - 0 Support Surface(s) Assessment (bed, cushion, seat, etc.) INTERVENTIONS -  Wound Cleansing / Measurement X - Simple Wound Cleansing - one wound 1 5 '[]'$  - 0 Complex Wound Cleansing - multiple wounds X- 1 5 Wound Imaging (photographs - any number of wounds) '[]'$  - 0 Wound Tracing (instead of photographs) X- 1 5 Simple Wound Measurement - one wound '[]'$  - 0 Complex Wound Measurement - multiple wounds INTERVENTIONS - Wound Dressings '[]'$  - 0 Small Wound Dressing one or multiple wounds '[]'$  - 0 Medium Wound Dressing one or multiple wounds X- 1 20 Large Wound Dressing one or multiple wounds '[]'$  - 0 Application of Medications - topical '[]'$  - 0 Application of Medications - injection INTERVENTIONS - Miscellaneous '[]'$  - 0 External ear exam '[]'$  - 0 Specimen Collection (cultures, biopsies, blood, body fluids, etc.) '[]'$  - 0 Specimen(s) / Culture(s) sent or taken to Lab for analysis '[]'$  - 0 Patient Transfer (multiple staff / Civil Service fast streamer / Similar devices) '[]'$  - 0 Simple Staple / Suture removal (25 or less) '[]'$  - 0 Complex Staple / Suture removal (26 or more) '[]'$  - 0 Hypo / Hyperglycemic Management (close monitor of Blood Glucose) AMBER, WILLIARD ([id number]) [id number] Page 3 of 8 '[]'$  - 0 Ankle / Brachial Index (ABI) - do not check if billed separately X- 1 5 Vital Signs Has the patient been seen at the hospital within the last three years: Yes Total Score: 120 Level Of Care: New/Established - Level 4 Electronic Signature(s) Signed: 10/08/2022 10:43:44 AM By: Deon Pilling RN, BSN Entered By: Deon Pilling on 10/07/2022 16:02:32 -------------------------------------------------------------------------------- Encounter Discharge Information Details Patient Name: Date of Service: [address]acey [address],  DO [place] J. 10/07/2022 2:30 PM Medical Record Number: [id number] Patient Account Number: [id number] Date of Birth/Sex: Treating RN: 03-06-1918 (88 y.o. [name] Primary Care Alaira Level: Marlowe Sax Other Clinician: Referring Souleymane Saiki: Treating Aryianna Earwood/Extender: Elsworth Soho in Treatment: 4 Encounter Discharge Information Items Discharge Condition: Stable Ambulatory Status: Wheelchair Discharge Destination: Home Transportation: Private Auto Accompanied By: son Schedule Follow-up Appointment: No Clinical Summary of Care: Electronic Signature(s) Signed: 10/08/2022 10:43:44 AM By: Deon Pilling RN, BSN Entered By: Deon Pilling on 10/07/2022 16:03:10 -------------------------------------------------------------------------------- Lower Extremity Assessment Details Patient Name: Date of Service: [place], DO [place] J. 10/07/2022 2:30 PM Medical Record Number: [id number] Patient Account Number: [id number] Date of Birth/Sex: Treating RN: 1917/12/15 (88 y.o. [name] Primary Care Deanza Upperman: Marlowe Sax Other Clinician: Referring Plez Belton: Treating Anthonette Lesage/Extender: Kalman Shan Ngetich, Dinah Weeks in Treatment: 4 Edema Assessment Assessed: [Left: Yes] [Right: No] Edema: [Left: Ye] [Right: s] Calf Left: Right: Point of Measurement: 32 cm From Medial Instep 30 cm Ankle Left: Right: Point of Measurement: 10 cm From Medial Instep 24.5 cm Vascular Assessment Left: [[id number] Page 4 of 8Right:] Pulses: Dorsalis Pedis Palpable: [[id number] Page 4 of 8Yes] Posterior Tibial Palpable: [[id number] Page 4 of 8Yes] Electronic Signature(s) Signed: 10/07/2022 4:11:21 PM By: Rhae Hammock RN Entered By: Rhae Hammock on 10/07/2022 14:46:01 -------------------------------------------------------------------------------- Multi Wound Chart  Details Patient Name: Date of Service: [place], DO [place] J. 10/07/2022 2:30 PM Medical Record Number: [id number] Patient Account Number: [id number] Date of Birth/Sex: Treating RN: 11/28/1917 (88 y.o. F) Primary Care Starlee Corralejo: Marlowe Sax Other Clinician: Referring Adryen Cookson: Treating Daveigh Batty/Extender: Kalman Shan Ngetich, Dinah Weeks in Treatment: 4 Vital Signs Height(in): 59 Pulse(bpm): 74 Weight(lbs): 100 Blood Pressure(mmHg): 133/74 Body Mass Index(BMI): 20.2 Temperature(F): 98.7 Respiratory Rate(breaths/min): 17 [1:Photos:] [N/A:N/A] Left, Circumferential Lower Leg N/A N/A Wound Location: Skin T ear/Laceration N/A N/A Wounding Event: Venous Leg Ulcer N/A N/A Primary Etiology: Lymphedema N/A  N/A Secondary Etiology: Hypertension, Peripheral Venous N/A N/A Comorbid History: Disease 09/08/2020 N/A N/A Date Acquired: 4 N/A N/A Weeks of Treatment: Open N/A N/A Wound Status: No N/A N/A Wound Recurrence: Yes N/A N/A Clustered Wound: 2 N/A N/A Clustered Quantity: 0x0x0 N/A N/A Measurements L x W x D (cm) 0 N/A N/A A (cm) : rea 0 N/A N/A Volume (cm) : 100.00% N/A N/A % Reduction in Area: 100.00% N/A N/A % Reduction in Volume: Full Thickness Without Exposed N/A N/A Classification: Support Structures Large N/A N/A Exudate Amount: Serous N/A N/A Exudate Type: amber N/A N/A Exudate Color: Distinct, outline attached N/A N/A Wound Margin: Large (67-100%) N/A N/A Granulation Amount: None Present (0%) N/A N/A Necrotic Amount: Fascia: No N/A N/A Exposed Structures: Fat Layer (Subcutaneous Tissue): No Tendon: No Muscle: No Joint: No Bone: No None N/A N/A EpithelializationGENEE, RANN ([id number]) [id number] Page 5 of 8 Excoriation: No N/A N/A Periwound Skin Texture: Induration: No Callus: No Crepitus: No Rash: No Scarring: No Dry/Scaly: Yes N/A N/A Periwound Skin Moisture: Maceration: No Atrophie  Blanche: No N/A N/A Periwound Skin Color: Cyanosis: No Ecchymosis: No Erythema: No Hemosiderin Staining: No Mottled: No Pallor: No Rubor: No No Abnormality N/A N/A Temperature: Yes N/A N/A Tenderness on Palpation: Treatment Notes Electronic Signature(s) Signed: 10/07/2022 3:46:12 PM By: Kalman Shan DO Entered By: Kalman Shan on 10/07/2022 15:42:30 -------------------------------------------------------------------------------- Multi-Disciplinary Care Plan Details Patient Name: Date of Service: [place], DO [place] 10/07/2022 2:30 PM Medical Record Number: [id number] Patient Account Number: [id number] Date of Birth/Sex: Treating RN: June 03, 1918 (88 y.o. [name] Primary Care Charlena Haub: Marlowe Sax Other Clinician: Referring Alvira Hecht: Treating Aliyha Fornes/Extender: Grier Rocher, Dinah Weeks in Treatment: 4 Active Inactive Electronic Signature(s) Signed: 10/08/2022 10:43:44 AM By: Deon Pilling RN, BSN Entered By: Deon Pilling on 10/07/2022 15:58:37 -------------------------------------------------------------------------------- Pain Assessment Details Patient Name: Date of Service: [place], DO [place] J. 10/07/2022 2:30 PM Medical Record Number: [id number] Patient Account Number: [id number] Date of Birth/Sex: Treating RN: [birth year] (88 y.o. [name] Primary Care Phyliss Hulick: Marlowe Sax Other Clinician: Referring Noa Galvao: Treating Tayleigh Wetherell/Extender: Kalman Shan Ngetich, Dinah Weeks in Treatment: 4 Active Problems Location of Pain Severity and Description of Pain Patient Has Paino Yes Site Locations Pain Location: EFFA, YARROW ([id number]) [id number] Page 6 of 8 Pain Location: Pain in Ulcers With Dressing Change: Yes Duration of the Pain. Constant / Intermittento Intermittent Rate the pain. Current Pain Level: 4 Worst Pain Level: 10 Least Pain Level: 0 Tolerable Pain Level:  4 Character of Pain Describe the Pain: Aching Pain Management and Medication Current Pain Management: Medication: No Cold Application: No Rest: No Massage: No Activity: No T.E.N.S.: No Heat Application: No Leg drop or elevation: No Is the Current Pain Management Adequate: Adequate How does your wound impact your activities of daily livingo Sleep: No Bathing: No Appetite: No Relationship With Others: No Bladder Continence: No Emotions: No Bowel Continence: No Work: No Toileting: No Drive: No Dressing: No Hobbies: No Electronic Signature(s) Signed: 10/07/2022 4:11:21 PM By: Rhae Hammock RN Entered By: Rhae Hammock on 10/07/2022 14:45:54 -------------------------------------------------------------------------------- Patient/Caregiver Education Details Patient Name: Date of Service: Heather [name] 11/21/2023andnbsp2:30 PM Medical Record Number: [id number] Patient Account Number: [id number] Date of Birth/Gender: Treating RN: [birth year] (88 y.o. [name] Primary Care Physician: Marlowe Sax Other Clinician: Referring Physician: Treating Physician/Extender: Elsworth Soho in Treatment: 4 Education Assessment Education Provided To: Patient Education Topics Provided Wound/Skin Impairment: Handouts: Skin Care Do's and Dont's Methods: Explain/Verbal  Responses: Reinforcements needed Electronic Signature(s) Signed: 10/08/2022 10:43:44 AM By: Deon Pilling RN, BSN [place], [name] ([id number]) AM By: Deon Pilling RN, BSN [phone].pdf Page 7 of 8 Signed: 10/08/2022 10:43:44 Entered By: Deon Pilling on 10/07/2022 16:00:07 -------------------------------------------------------------------------------- Wound Assessment Details Patient Name: Date of Service: Heather [name] 10/07/2022 2:30 PM Medical Record Number: [id number] Patient Account Number: [id number] Date of Birth/Sex: Treating  RN: 07-28-1918 (88 y.o. F) Primary Care Michoel Kunin: Marlowe Sax Other Clinician: Referring Shaft Corigliano: Treating Mikaela Hilgeman/Extender: Kalman Shan Ngetich, Dinah Weeks in Treatment: 4 Wound Status Wound Number: 1 Primary Etiology: Venous Leg Ulcer Wound Location: Left, Circumferential Lower Leg Secondary Etiology: Lymphedema Wounding Event: Skin Tear/Laceration Wound Status: Open Date Acquired: 09/08/2020 Comorbid History: Hypertension, Peripheral Venous Disease Weeks Of Treatment: 4 Clustered Wound: Yes Photos Wound Measurements Length: (cm) Width: (cm) Depth: (cm) Clustered Quantity: Area: (cm) Volume: (cm) 0 % Reduction in Area: 100% 0 % Reduction in Volume: 100% 0 Epithelialization: None 2 Tunneling: No 0 Undermining: No 0 Wound Description Classification: Full Thickness Without Exposed Sup Wound Margin: Distinct, outline attached Exudate Amount: Large Exudate Type: Serous Exudate Color: amber port Structures Foul Odor After Cleansing: No Slough/Fibrino Yes Wound Bed Granulation Amount: Large (67-100%) Exposed Structure Necrotic Amount: None Present (0%) Fascia Exposed: No Fat Layer (Subcutaneous Tissue) Exposed: No Tendon Exposed: No Muscle Exposed: No Joint Exposed: No Bone Exposed: No Periwound Skin Texture Texture Color No Abnormalities Noted: No No Abnormalities Noted: No Callus: No Atrophie Blanche: No Crepitus: No Cyanosis: No Excoriation: No Ecchymosis: No Induration: No Erythema: No Rash: No Hemosiderin Staining: No Scarring: No Mottled: No CHERRILL, SCRIMA ([id number]) [id number] Page 8 of 8 Pallor: No Moisture Rubor: No No Abnormalities Noted: No Dry / Scaly: Yes Temperature / Pain Maceration: No Temperature: No Abnormality Tenderness on Palpation: Yes Electronic Signature(s) Signed: 10/07/2022 4:33:14 PM By: Erenest Blank Entered By: Erenest Blank on 10/07/2022  14:53:06 -------------------------------------------------------------------------------- Vitals Details Patient Name: Date of Service: [place], DO [place] 10/07/2022 2:30 PM Medical Record Number: [id number] Patient Account Number: [id number] Date of Birth/Sex: Treating RN: [birth year] (88 y.o. Tonita Phoenix, Lauren Primary Care Albert Devaul: Marlowe Sax Other Clinician: Referring Lashun Mccants: Treating Chrsitopher Wik/Extender: Kalman Shan Ngetich, Dinah Weeks in Treatment: 4 Vital Signs Time Taken: 14:45 Temperature (F): 98.7 Height (in): 59 Pulse (bpm): 74 Weight (lbs): 100 Respiratory Rate (breaths/min): 17 Body Mass Index (BMI): 20.2 Blood Pressure (mmHg): 133/74 Reference Range: 80 - 120 mg / dl Electronic Signature(s) Signed: 10/07/2022 4:11:21 PM By: Rhae Hammock RN Entered By: Rhae Hammock on 10/07/2022 14:45:37

## 2022-12-03 ENCOUNTER — Telehealth: Payer: Self-pay

## 2022-12-03 NOTE — Telephone Encounter (Signed)
Transition Care Management Follow-up Telephone Call Date of discharge and from where: Hospice 12/03/2022 How have you been since you were released from the hospital? better Any questions or concerns? No  Items Reviewed: Did the pt receive and understand the discharge instructions provided? Yes  Medications obtained and verified? Yes  Other? No  Any new allergies since your discharge? No  Dietary orders reviewed? No Do you have support at home? Yes   Home Care and Equipment/Supplies: Were home health services ordered? yes If so, what is the name of the agency? N/A  Has the agency set up a time to come to the patient's home? yes Were any new equipment or medical supplies ordered?  No What is the name of the medical supply agency? N/A Were you able to get the supplies/equipment? not applicable Do you have any questions related to the use of the equipment or supplies? No  Functional Questionnaire: (I = Independent and D = Dependent) ADLs: D  Bathing/Dressing- D  Meal Prep- D  Eating- D  Maintaining continence- D  Transferring/Ambulation- D  Managing Meds- d  Follow up appointments reviewed:  PCP Hospital f/u appt confirmed? Yes  Scheduled to see Ngetich, Nelda Bucks, NP on 12/05/2022 @ 340pm. [facility] f/u appt confirmed? No  Scheduled to see N/A on N/A @ N/A. Are transportation arrangements needed? No  If their condition worsens, is the pt aware to call PCP or go to the Emergency Dept.? Yes Was the patient provided with contact information for the PCP's office or ED? Yes Was to pt encouraged to call back with questions or concerns? Yes

## 2022-12-05 ENCOUNTER — Ambulatory Visit (INDEPENDENT_AMBULATORY_CARE_PROVIDER_SITE_OTHER): Payer: Medicare Other | Admitting: Family

## 2022-12-05 ENCOUNTER — Encounter: Payer: Self-pay | Admitting: Family

## 2022-12-05 VITALS — BP 98/60 | HR 70 | Temp 97.8°F | Resp 16 | Ht <= 58 in

## 2022-12-05 DIAGNOSIS — L89622 Pressure ulcer of left heel, stage 2: Secondary | ICD-10-CM

## 2022-12-05 DIAGNOSIS — G301 Alzheimer's disease with late onset: Secondary | ICD-10-CM | POA: Diagnosis not present

## 2022-12-05 DIAGNOSIS — I5032 Chronic diastolic (congestive) heart failure: Secondary | ICD-10-CM

## 2022-12-05 DIAGNOSIS — I872 Venous insufficiency (chronic) (peripheral): Secondary | ICD-10-CM

## 2022-12-05 DIAGNOSIS — I110 Hypertensive heart disease with heart failure: Secondary | ICD-10-CM | POA: Diagnosis not present

## 2022-12-05 DIAGNOSIS — R2681 Unsteadiness on feet: Secondary | ICD-10-CM

## 2022-12-05 DIAGNOSIS — F02818 Dementia in other diseases classified elsewhere, unspecified severity, with other behavioral disturbance: Secondary | ICD-10-CM

## 2022-12-05 DIAGNOSIS — H01005 Unspecified blepharitis left lower eyelid: Secondary | ICD-10-CM

## 2022-12-05 DIAGNOSIS — H01002 Unspecified blepharitis right lower eyelid: Secondary | ICD-10-CM

## 2022-12-05 MED ORDER — GENTAMICIN SULFATE 0.3 % OP SOLN: 1.0000 [drp] | Freq: Three times a day (TID) | OPHTHALMIC | 0 refills | Status: AC

## 2022-12-05 NOTE — Progress Notes (Signed)
Provider: Marlowe Sax FNP-C   Leanah Kolander, Nelda Bucks, NP  Patient Care Team: Zyaire Mccleod, Nelda Bucks, NP as PCP - General (Family Medicine) Josue Hector, MD as PCP - Cardiology (Cardiology) Peggye Form, MD as Referring Physician (Internal Medicine) Garald Balding, MD as Attending Physician (Orthopedic Surgery) Debbra Riding, MD as Consulting Physician (Ophthalmology) Noralee Space, MD as Consulting Physician (Pulmonary Disease)  Extended Emergency Contact Information Primary Emergency Contact: Turrell,Michael Address: [place], [place] [address] Johnnette Litter of [place] Phone: [phone] Mobile Phone: [phone] Relation: Son Secondary Emergency Contact: [facility] Phone: [phone] Relation: Other Preferred language: Cleophus Molt Interpreter needed? No  Code Status:  Full Code  Goals of care: Advanced Directive information    12/05/2022    3:51 PM  Advanced Directives  Does Patient Have a Medical Advance Directive? Yes  Type of Advance Directive Living will;Out of facility DNR (pink MOST or yellow form)     Chief Complaint  Patient presents with   Discharge     Discharge from [facility] 12/03/2022    HPI:  Pt is a 89 y.o. female seen today for medical management of chronic diseases.   Has wound on left heel that was getting better but takes off the dressing  Fell few weeks ago hit the back of the head.sustained lacerated bur now healed.she is here with her son  who states patient's eyes are red again.previously treated for Blepharitis conjunctivitis. She denies any fever,chills or URI.    Past Medical History:  Diagnosis Date   Abdominal pain, left lower quadrant 06/22/2013   Abnormal CT scan of lung 07/16/2015   Allergic rhinitis, cause unspecified 02/21/2014   Anxiety state, unspecified    Breast cancer ([place])    NO STICK OR BLOOD PRESSURE CHECKS IN LEFT ARM   Carpal tunnel syndrome    Carpal tunnel syndrome on both sides  10/03/2008   Qualifier: Diagnosis of  By: Lenna Gilford MD, Deborra Medina    Cervical spondylosis    Chronic pain syndrome 03/03/2019   Colon polyp 12/19/2009   Transverse-polypoid colorectal mucosa   Constipation    Cough 10/21/2014   Degenerative joint disease    Diastolic dysfunction    Diverticula of colon    Diverticulosis of colon 05/24/2002   Qualifier: Diagnosis of  By: Jerral Ralph     Gastritis, chronic    Hiatal hernia    Hypertension    Low back pain    LPRD (laryngopharyngeal reflux disease) 04/04/2014   Osteoporosis 05/22/2020   RBBB (right bundle branch block with left anterior fascicular block)    Renal cyst    Venous insufficiency    Past Surgical History:  Procedure Laterality Date   CARPAL TUNNEL RELEASE     INTRAMEDULLARY (IM) NAIL INTERTROCHANTERIC Right 11/13/2021   Procedure: INTRAMEDULLARY (IM) NAIL INTERTROCHANTRIC;  Surgeon: Vanetta Mulders, MD;  Location: [facility];  Service: Orthopedics;  Laterality: Right;   Left mastectomy      Allergies  Allergen Reactions   Fentanyl Nausea Only and Other (See Comments)    Pt states that she is not allergic     Naprosyn [Naproxen] Other (See Comments)    Patient felt tongue and face swelling. Went to ED where no visible swelling noted    Allergies as of 12/05/2022       Reactions   Fentanyl Nausea Only, Other (See Comments)   Pt states that she is not allergic  Naprosyn [naproxen] Other (See Comments)   Patient felt tongue and face swelling. Went to ED where no visible swelling noted        Medication List        Accurate as of December 05, 2022  4:17 PM. If you have any questions, ask your nurse or doctor.          STOP taking these medications    levocetirizine 5 MG tablet Commonly known as: XYZAL Stopped by: Sandrea Hughs, NP   zinc oxide 11.3 % Crea cream Commonly known as: BALMEX Stopped by: Sandrea Hughs, NP       TAKE these medications    acetaminophen 500 MG tablet Commonly known  as: TYLENOL Take 500 mg by mouth every 6 (six) hours as needed for moderate pain or headache.   carvedilol 3.125 MG tablet Commonly known as: COREG Take 1 tablet (3.125 mg total) by mouth 2 (two) times daily. Hold for SBP <110 or HR <70   cholecalciferol 1000 units tablet Commonly known as: VITAMIN D Take 1,000 Units by mouth daily.   famotidine 20 MG tablet Commonly known as: PEPCID Take 1 tablet (20 mg total) by mouth daily.   feeding supplement Liqd Take 237 mLs by mouth 2 (two) times daily between meals.   furosemide 40 MG tablet Commonly known as: LASIX Take 40 mg by mouth daily.   lidocaine 5 % Commonly known as: LIDODERM Place 1 patch onto the skin as needed. Remove & Discard patch within 12 hours or as directed by MD   LORazepam 0.5 MG tablet Commonly known as: ATIVAN Take 0.5 mg by mouth as needed.   multivitamins ther. w/minerals Tabs tablet Take 1 tablet by mouth daily.   polyethylene glycol 17 g packet Commonly known as: MIRALAX / GLYCOLAX Take 17 g by mouth daily.   sennosides-docusate sodium 8.6-50 MG tablet Commonly known as: SENOKOT-S Take 1-2 tablets by mouth daily as needed for constipation.   spironolactone 25 MG tablet Commonly known as: ALDACTONE Take 25 mg by mouth daily.   SYSTANE COMPLETE OP Apply 1 drop to eye daily as needed (dry eyes).        Review of Systems  Constitutional:  Negative for appetite change, chills, fatigue, fever and unexpected weight change.  HENT:  Positive for hearing loss. Negative for congestion, ear discharge, ear pain, nosebleeds, postnasal drip, rhinorrhea, sinus pressure, sinus pain, sneezing, sore throat, tinnitus and trouble swallowing.   Eyes:  Negative for pain, discharge, redness, itching and visual disturbance.  Respiratory:  Negative for cough, chest tightness, shortness of breath and wheezing.   Cardiovascular:  Negative for chest pain, palpitations and leg swelling.  Gastrointestinal:  Negative for  abdominal distention, abdominal pain, blood in stool, constipation, diarrhea, nausea and vomiting.  Genitourinary:  Negative for difficulty urinating, dysuria, flank pain and urgency.       Incontinent   Musculoskeletal:  Positive for arthralgias and gait problem. Negative for back pain, joint swelling, myalgias, neck pain and neck stiffness.  Skin:  Positive for wound. Negative for color change, pallor and rash.       Left leg heel wound with knee high wrap.  Neurological:  Negative for dizziness, syncope, speech difficulty, weakness, light-headedness, numbness and headaches.  Hematological:  Does not bruise/bleed easily.  Psychiatric/Behavioral:  Negative for agitation, behavioral problems, confusion, hallucinations and sleep disturbance. The patient is not nervous/anxious.     Immunization History  Administered Date(s) Administered   Fluad Quad(high Dose 65+)  08/19/2021   Influenza, High Dose Seasonal PF 08/21/2015   Influenza,inj,Quad PF,6+ Mos 09/18/2015   PFIZER(Purple Top)SARS-COV-2 Vaccination 04/18/2020, 05/10/2020   Pneumococcal Polysaccharide-23 02/07/2015   Pertinent  Health Maintenance Due  Topic Date Due   INFLUENZA VACCINE  02/15/2023 (Originally 06/17/2022)   DEXA SCAN  Completed      01/22/2022    1:08 PM 05/29/2022    3:07 PM 07/08/2022    2:05 PM 08/21/2022    1:56 PM 12/05/2022    3:50 PM  [place] in the past year? 1 0 1 0 1  Was there an injury with Fall? 1 0 0 0 1  Fall Risk Category Calculator 2 0 1 0 3  Fall Risk Category (Retired) Moderate Low Low Low   (RETIRED) Patient Fall Risk Level Moderate fall risk Low fall risk Low fall risk Low fall risk   Patient at Risk for Falls Due to History of fall(s) No Fall Risks History of fall(s);Impaired balance/gait;Impaired mobility No Fall Risks History of fall(s);Impaired balance/gait;Impaired mobility  Fall risk Follow up Falls evaluation completed;Education provided;Falls prevention discussed Falls evaluation  completed Falls evaluation completed;Education provided;Falls prevention discussed Falls evaluation completed Falls evaluation completed;Education provided;Falls prevention discussed   Functional Status Survey:    Vitals:   12/05/22 1549  BP: 98/60  Pulse: 70  Resp: 16  Temp: 97.8 F (36.6 C)  SpO2: 92%  Height: '4\' 10"'$  (1.473 m)   Body mass index is 22.99 kg/m. Physical Exam Vitals reviewed.  Constitutional:      General: She is not in acute distress.    Appearance: Normal appearance. She is normal weight. She is not ill-appearing or diaphoretic.  HENT:     Head: Normocephalic.     Right Ear: Tympanic membrane, ear canal and external ear normal. There is no impacted cerumen.     Left Ear: Tympanic membrane, ear canal and external ear normal. There is no impacted cerumen.     Nose: Nose normal. No congestion or rhinorrhea.     Mouth/Throat:     Mouth: Mucous membranes are moist.     Pharynx: Oropharynx is clear. No oropharyngeal exudate or posterior oropharyngeal erythema.  Eyes:     General: No scleral icterus.       Right eye: No discharge.        Left eye: No discharge.     Extraocular Movements: Extraocular movements intact.     Conjunctiva/sclera: Conjunctivae normal.     Pupils: Pupils are equal, round, and reactive to light.  Neck:     Vascular: No carotid bruit.  Cardiovascular:     Rate and Rhythm: Normal rate and regular rhythm.     Pulses: Normal pulses.     Heart sounds: Normal heart sounds. No murmur heard.    No friction rub. No gallop.  Pulmonary:     Effort: Pulmonary effort is normal. No respiratory distress.     Breath sounds: Normal breath sounds. No wheezing, rhonchi or rales.  Chest:     Chest wall: No tenderness.  Abdominal:     General: Bowel sounds are normal. There is no distension.     Palpations: Abdomen is soft. There is no mass.     Tenderness: There is no abdominal tenderness. There is no right CVA tenderness, left CVA tenderness,  guarding or rebound.  Musculoskeletal:        General: No swelling or tenderness. Normal range of motion.     Cervical back: Normal range of  motion. No rigidity or tenderness.     Right lower leg: No edema.     Left lower leg: No edema.  Lymphadenopathy:     Cervical: No cervical adenopathy.  Skin:    General: Skin is warm and dry.     Coloration: Skin is not pale.     Findings: No bruising, erythema, lesion or rash.     Comments: Left heel ulcer not visualized has knee high dressing changed one day ago by [facility].No drainage noted.    Neurological:     Mental Status: She is alert. Mental status is at baseline.     Motor: No weakness.     Gait: Gait abnormal.  Psychiatric:        Mood and Affect: Mood normal.        Speech: Speech normal.        Behavior: Behavior normal.     Labs reviewed: Recent Labs    01/22/22 1400  NA 141  K 4.9  CL 104  CO2 29  GLUCOSE 89  BUN 30*  CREATININE 0.85  CALCIUM 9.3   No results for input(s): "AST", "ALT", "ALKPHOS", "BILITOT", "PROT", "ALBUMIN" in the last 8760 hours. Recent Labs    01/22/22 1400  WBC 6.3  NEUTROABS 3,648  HGB 11.5*  HCT 35.9  MCV 92.5  PLT 331   Lab Results  Component Value Date   TSH 3.14 10/09/2020   No results found for: "HGBA1C" Lab Results  Component Value Date   CHOL 199 02/02/2014   HDL 98.70 02/02/2014   LDLCALC 96 02/02/2014   TRIG 20.0 02/02/2014   CHOLHDL 2 02/02/2014    Significant Diagnostic Results in last 30 days:  No results found.  Assessment/Plan 1. Pressure injury of left heel, stage 2 ([place]) -encouraged to float leg to offload pressure on the heel.wound previous managed by Hospice Nurse but recently discharged by Hospice.will refer to [facility] then wound care clinic if wound worsen.  - Ambulatory referral to Home Health  2. Chronic diastolic congestive heart failure (HCC) No signs of fluid overload  -Continue on spironolactone, furosemide and Coreg  3. Hypertensive heart disease  with chronic diastolic congestive heart failure (HCC) B/p stable  Continue spironolactone, furosemide and Coreg  4. Late onset Alzheimer's dementia with behavioral disturbance ([place]) No new behavioral issues Recently discharged from hospice weight stable. Continue with supportive care Continue with Ensure protein supplement  5. Venous (peripheral) insufficiency Has left heel ulcer Not healing due to patient not cooperative in elevating leg to offload.  Continue to monitor home health nurse to change dressing and will refer to wound care clinic if symptoms worsen.  6. Unsteady gait S/p fall evaluated in the ED no injuries to head  7. Blepharitis of lower eyelids of both eyes, unspecified type Bilateral eyelid redness noted No vision changes Start on gentamicin eyedrops as below - gentamicin (GARAMYCIN) 0.3 % ophthalmic solution; Place 1 drop into both eyes 3 (three) times daily for 7 days.  Dispense: 1.1 mL; Refill: 0   Family/ staff Communication: Reviewed plan of care with patient and son verbalized understanding  Labs/tests ordered: None   Next Appointment : Return in about 6 months (around 06/05/2023) for medical mangement of chronic issues., Fasting labs in 6 months prior to visit.   Sandrea Hughs, NP

## 2022-12-05 NOTE — Patient Instructions (Signed)
Left heel wound care ,cleanse with saline ,pat dry , then spray compound and cover with foam dressing ,gauze ,wrap with kerlix and coban change dressing 3 times per week or as needed.

## 2022-12-10 ENCOUNTER — Telehealth: Payer: Self-pay

## 2022-12-10 DIAGNOSIS — Z9181 History of falling: Secondary | ICD-10-CM | POA: Diagnosis not present

## 2022-12-10 DIAGNOSIS — I110 Hypertensive heart disease with heart failure: Secondary | ICD-10-CM | POA: Diagnosis not present

## 2022-12-10 DIAGNOSIS — Z853 Personal history of malignant neoplasm of breast: Secondary | ICD-10-CM | POA: Diagnosis not present

## 2022-12-10 DIAGNOSIS — Z556 Problems related to health literacy: Secondary | ICD-10-CM | POA: Diagnosis not present

## 2022-12-10 DIAGNOSIS — G894 Chronic pain syndrome: Secondary | ICD-10-CM | POA: Diagnosis not present

## 2022-12-10 DIAGNOSIS — Z741 Need for assistance with personal care: Secondary | ICD-10-CM | POA: Diagnosis not present

## 2022-12-10 DIAGNOSIS — I4510 Unspecified right bundle-branch block: Secondary | ICD-10-CM | POA: Diagnosis not present

## 2022-12-10 DIAGNOSIS — F411 Generalized anxiety disorder: Secondary | ICD-10-CM | POA: Diagnosis not present

## 2022-12-10 DIAGNOSIS — I5032 Chronic diastolic (congestive) heart failure: Secondary | ICD-10-CM | POA: Diagnosis not present

## 2022-12-10 DIAGNOSIS — K5900 Constipation, unspecified: Secondary | ICD-10-CM | POA: Diagnosis not present

## 2022-12-10 DIAGNOSIS — H01005 Unspecified blepharitis left lower eyelid: Secondary | ICD-10-CM | POA: Diagnosis not present

## 2022-12-10 DIAGNOSIS — M47812 Spondylosis without myelopathy or radiculopathy, cervical region: Secondary | ICD-10-CM | POA: Diagnosis not present

## 2022-12-10 DIAGNOSIS — M810 Age-related osteoporosis without current pathological fracture: Secondary | ICD-10-CM | POA: Diagnosis not present

## 2022-12-10 DIAGNOSIS — K449 Diaphragmatic hernia without obstruction or gangrene: Secondary | ICD-10-CM | POA: Diagnosis not present

## 2022-12-10 DIAGNOSIS — F0284 Dementia in other diseases classified elsewhere, unspecified severity, with anxiety: Secondary | ICD-10-CM | POA: Diagnosis not present

## 2022-12-10 DIAGNOSIS — G301 Alzheimer's disease with late onset: Secondary | ICD-10-CM | POA: Diagnosis not present

## 2022-12-10 DIAGNOSIS — I872 Venous insufficiency (chronic) (peripheral): Secondary | ICD-10-CM | POA: Diagnosis not present

## 2022-12-10 DIAGNOSIS — H01002 Unspecified blepharitis right lower eyelid: Secondary | ICD-10-CM | POA: Diagnosis not present

## 2022-12-10 DIAGNOSIS — J309 Allergic rhinitis, unspecified: Secondary | ICD-10-CM | POA: Diagnosis not present

## 2022-12-10 DIAGNOSIS — K219 Gastro-esophageal reflux disease without esophagitis: Secondary | ICD-10-CM | POA: Diagnosis not present

## 2022-12-10 DIAGNOSIS — Z993 Dependence on wheelchair: Secondary | ICD-10-CM | POA: Diagnosis not present

## 2022-12-10 DIAGNOSIS — L97821 Non-pressure chronic ulcer of other part of left lower leg limited to breakdown of skin: Secondary | ICD-10-CM | POA: Diagnosis not present

## 2022-12-10 DIAGNOSIS — K5730 Diverticulosis of large intestine without perforation or abscess without bleeding: Secondary | ICD-10-CM | POA: Diagnosis not present

## 2022-12-10 NOTE — Telephone Encounter (Signed)
Okay to give verbal orders as requested.may use calcium Alginate and foam dressing.

## 2022-12-10 NOTE — Telephone Encounter (Signed)
Spoke with Patient caregiver to give the verbal orders that was requested.

## 2022-12-10 NOTE — Telephone Encounter (Signed)
Patient caregiver left a voicemail this morning for requesting verbal orders for the patient to seen for Nursing Visit for two times a week, A Home Aide for two times a week for four days, Also to be requesting  to be seen. occupational therapy.  Patient caregiver was concern about leg wound in the inner thigh and want to know to if it is okay to use calcium alginate dressing and foam dressing to dress the wound.

## 2022-12-12 DIAGNOSIS — I872 Venous insufficiency (chronic) (peripheral): Secondary | ICD-10-CM | POA: Diagnosis not present

## 2022-12-12 DIAGNOSIS — L97821 Non-pressure chronic ulcer of other part of left lower leg limited to breakdown of skin: Secondary | ICD-10-CM | POA: Diagnosis not present

## 2022-12-12 DIAGNOSIS — M810 Age-related osteoporosis without current pathological fracture: Secondary | ICD-10-CM | POA: Diagnosis not present

## 2022-12-12 DIAGNOSIS — K5730 Diverticulosis of large intestine without perforation or abscess without bleeding: Secondary | ICD-10-CM | POA: Diagnosis not present

## 2022-12-12 DIAGNOSIS — J309 Allergic rhinitis, unspecified: Secondary | ICD-10-CM | POA: Diagnosis not present

## 2022-12-12 DIAGNOSIS — G301 Alzheimer's disease with late onset: Secondary | ICD-10-CM | POA: Diagnosis not present

## 2022-12-12 DIAGNOSIS — K5900 Constipation, unspecified: Secondary | ICD-10-CM | POA: Diagnosis not present

## 2022-12-12 DIAGNOSIS — I4510 Unspecified right bundle-branch block: Secondary | ICD-10-CM | POA: Diagnosis not present

## 2022-12-12 DIAGNOSIS — G894 Chronic pain syndrome: Secondary | ICD-10-CM | POA: Diagnosis not present

## 2022-12-12 DIAGNOSIS — I110 Hypertensive heart disease with heart failure: Secondary | ICD-10-CM | POA: Diagnosis not present

## 2022-12-12 DIAGNOSIS — K449 Diaphragmatic hernia without obstruction or gangrene: Secondary | ICD-10-CM | POA: Diagnosis not present

## 2022-12-12 DIAGNOSIS — K219 Gastro-esophageal reflux disease without esophagitis: Secondary | ICD-10-CM | POA: Diagnosis not present

## 2022-12-12 DIAGNOSIS — I5032 Chronic diastolic (congestive) heart failure: Secondary | ICD-10-CM | POA: Diagnosis not present

## 2022-12-12 DIAGNOSIS — M47812 Spondylosis without myelopathy or radiculopathy, cervical region: Secondary | ICD-10-CM | POA: Diagnosis not present

## 2022-12-12 DIAGNOSIS — F411 Generalized anxiety disorder: Secondary | ICD-10-CM | POA: Diagnosis not present

## 2022-12-12 DIAGNOSIS — F0284 Dementia in other diseases classified elsewhere, unspecified severity, with anxiety: Secondary | ICD-10-CM | POA: Diagnosis not present

## 2022-12-13 DIAGNOSIS — J309 Allergic rhinitis, unspecified: Secondary | ICD-10-CM | POA: Diagnosis not present

## 2022-12-13 DIAGNOSIS — I872 Venous insufficiency (chronic) (peripheral): Secondary | ICD-10-CM | POA: Diagnosis not present

## 2022-12-13 DIAGNOSIS — K5900 Constipation, unspecified: Secondary | ICD-10-CM | POA: Diagnosis not present

## 2022-12-13 DIAGNOSIS — I5032 Chronic diastolic (congestive) heart failure: Secondary | ICD-10-CM | POA: Diagnosis not present

## 2022-12-13 DIAGNOSIS — F0284 Dementia in other diseases classified elsewhere, unspecified severity, with anxiety: Secondary | ICD-10-CM | POA: Diagnosis not present

## 2022-12-13 DIAGNOSIS — K219 Gastro-esophageal reflux disease without esophagitis: Secondary | ICD-10-CM | POA: Diagnosis not present

## 2022-12-13 DIAGNOSIS — G894 Chronic pain syndrome: Secondary | ICD-10-CM | POA: Diagnosis not present

## 2022-12-13 DIAGNOSIS — F411 Generalized anxiety disorder: Secondary | ICD-10-CM | POA: Diagnosis not present

## 2022-12-13 DIAGNOSIS — M810 Age-related osteoporosis without current pathological fracture: Secondary | ICD-10-CM | POA: Diagnosis not present

## 2022-12-13 DIAGNOSIS — I4510 Unspecified right bundle-branch block: Secondary | ICD-10-CM | POA: Diagnosis not present

## 2022-12-13 DIAGNOSIS — I110 Hypertensive heart disease with heart failure: Secondary | ICD-10-CM | POA: Diagnosis not present

## 2022-12-13 DIAGNOSIS — L97821 Non-pressure chronic ulcer of other part of left lower leg limited to breakdown of skin: Secondary | ICD-10-CM | POA: Diagnosis not present

## 2022-12-13 DIAGNOSIS — M47812 Spondylosis without myelopathy or radiculopathy, cervical region: Secondary | ICD-10-CM | POA: Diagnosis not present

## 2022-12-13 DIAGNOSIS — K449 Diaphragmatic hernia without obstruction or gangrene: Secondary | ICD-10-CM | POA: Diagnosis not present

## 2022-12-13 DIAGNOSIS — G301 Alzheimer's disease with late onset: Secondary | ICD-10-CM | POA: Diagnosis not present

## 2022-12-13 DIAGNOSIS — K5730 Diverticulosis of large intestine without perforation or abscess without bleeding: Secondary | ICD-10-CM | POA: Diagnosis not present

## 2022-12-15 DIAGNOSIS — G894 Chronic pain syndrome: Secondary | ICD-10-CM | POA: Diagnosis not present

## 2022-12-15 DIAGNOSIS — I110 Hypertensive heart disease with heart failure: Secondary | ICD-10-CM | POA: Diagnosis not present

## 2022-12-15 DIAGNOSIS — M810 Age-related osteoporosis without current pathological fracture: Secondary | ICD-10-CM | POA: Diagnosis not present

## 2022-12-15 DIAGNOSIS — I872 Venous insufficiency (chronic) (peripheral): Secondary | ICD-10-CM | POA: Diagnosis not present

## 2022-12-15 DIAGNOSIS — L97821 Non-pressure chronic ulcer of other part of left lower leg limited to breakdown of skin: Secondary | ICD-10-CM | POA: Diagnosis not present

## 2022-12-15 DIAGNOSIS — K449 Diaphragmatic hernia without obstruction or gangrene: Secondary | ICD-10-CM | POA: Diagnosis not present

## 2022-12-15 DIAGNOSIS — J309 Allergic rhinitis, unspecified: Secondary | ICD-10-CM | POA: Diagnosis not present

## 2022-12-15 DIAGNOSIS — G301 Alzheimer's disease with late onset: Secondary | ICD-10-CM | POA: Diagnosis not present

## 2022-12-15 DIAGNOSIS — F0284 Dementia in other diseases classified elsewhere, unspecified severity, with anxiety: Secondary | ICD-10-CM | POA: Diagnosis not present

## 2022-12-15 DIAGNOSIS — K5900 Constipation, unspecified: Secondary | ICD-10-CM | POA: Diagnosis not present

## 2022-12-15 DIAGNOSIS — F411 Generalized anxiety disorder: Secondary | ICD-10-CM | POA: Diagnosis not present

## 2022-12-15 DIAGNOSIS — K5730 Diverticulosis of large intestine without perforation or abscess without bleeding: Secondary | ICD-10-CM | POA: Diagnosis not present

## 2022-12-15 DIAGNOSIS — M47812 Spondylosis without myelopathy or radiculopathy, cervical region: Secondary | ICD-10-CM | POA: Diagnosis not present

## 2022-12-15 DIAGNOSIS — I4510 Unspecified right bundle-branch block: Secondary | ICD-10-CM | POA: Diagnosis not present

## 2022-12-15 DIAGNOSIS — K219 Gastro-esophageal reflux disease without esophagitis: Secondary | ICD-10-CM | POA: Diagnosis not present

## 2022-12-15 DIAGNOSIS — I5032 Chronic diastolic (congestive) heart failure: Secondary | ICD-10-CM | POA: Diagnosis not present

## 2022-12-16 DIAGNOSIS — F0284 Dementia in other diseases classified elsewhere, unspecified severity, with anxiety: Secondary | ICD-10-CM | POA: Diagnosis not present

## 2022-12-16 DIAGNOSIS — K219 Gastro-esophageal reflux disease without esophagitis: Secondary | ICD-10-CM | POA: Diagnosis not present

## 2022-12-16 DIAGNOSIS — M810 Age-related osteoporosis without current pathological fracture: Secondary | ICD-10-CM | POA: Diagnosis not present

## 2022-12-16 DIAGNOSIS — K5730 Diverticulosis of large intestine without perforation or abscess without bleeding: Secondary | ICD-10-CM | POA: Diagnosis not present

## 2022-12-16 DIAGNOSIS — I4510 Unspecified right bundle-branch block: Secondary | ICD-10-CM | POA: Diagnosis not present

## 2022-12-16 DIAGNOSIS — G301 Alzheimer's disease with late onset: Secondary | ICD-10-CM | POA: Diagnosis not present

## 2022-12-16 DIAGNOSIS — I872 Venous insufficiency (chronic) (peripheral): Secondary | ICD-10-CM | POA: Diagnosis not present

## 2022-12-16 DIAGNOSIS — K5900 Constipation, unspecified: Secondary | ICD-10-CM | POA: Diagnosis not present

## 2022-12-16 DIAGNOSIS — G894 Chronic pain syndrome: Secondary | ICD-10-CM | POA: Diagnosis not present

## 2022-12-16 DIAGNOSIS — I110 Hypertensive heart disease with heart failure: Secondary | ICD-10-CM | POA: Diagnosis not present

## 2022-12-16 DIAGNOSIS — K449 Diaphragmatic hernia without obstruction or gangrene: Secondary | ICD-10-CM | POA: Diagnosis not present

## 2022-12-16 DIAGNOSIS — I5032 Chronic diastolic (congestive) heart failure: Secondary | ICD-10-CM | POA: Diagnosis not present

## 2022-12-16 DIAGNOSIS — J309 Allergic rhinitis, unspecified: Secondary | ICD-10-CM | POA: Diagnosis not present

## 2022-12-16 DIAGNOSIS — M47812 Spondylosis without myelopathy or radiculopathy, cervical region: Secondary | ICD-10-CM | POA: Diagnosis not present

## 2022-12-16 DIAGNOSIS — L97821 Non-pressure chronic ulcer of other part of left lower leg limited to breakdown of skin: Secondary | ICD-10-CM | POA: Diagnosis not present

## 2022-12-16 DIAGNOSIS — F411 Generalized anxiety disorder: Secondary | ICD-10-CM | POA: Diagnosis not present

## 2022-12-17 DIAGNOSIS — M810 Age-related osteoporosis without current pathological fracture: Secondary | ICD-10-CM | POA: Diagnosis not present

## 2022-12-17 DIAGNOSIS — J309 Allergic rhinitis, unspecified: Secondary | ICD-10-CM | POA: Diagnosis not present

## 2022-12-17 DIAGNOSIS — G894 Chronic pain syndrome: Secondary | ICD-10-CM | POA: Diagnosis not present

## 2022-12-17 DIAGNOSIS — I872 Venous insufficiency (chronic) (peripheral): Secondary | ICD-10-CM | POA: Diagnosis not present

## 2022-12-17 DIAGNOSIS — I5032 Chronic diastolic (congestive) heart failure: Secondary | ICD-10-CM | POA: Diagnosis not present

## 2022-12-17 DIAGNOSIS — G301 Alzheimer's disease with late onset: Secondary | ICD-10-CM | POA: Diagnosis not present

## 2022-12-17 DIAGNOSIS — I4510 Unspecified right bundle-branch block: Secondary | ICD-10-CM | POA: Diagnosis not present

## 2022-12-17 DIAGNOSIS — M47812 Spondylosis without myelopathy or radiculopathy, cervical region: Secondary | ICD-10-CM | POA: Diagnosis not present

## 2022-12-17 DIAGNOSIS — K449 Diaphragmatic hernia without obstruction or gangrene: Secondary | ICD-10-CM | POA: Diagnosis not present

## 2022-12-17 DIAGNOSIS — K5900 Constipation, unspecified: Secondary | ICD-10-CM | POA: Diagnosis not present

## 2022-12-17 DIAGNOSIS — I110 Hypertensive heart disease with heart failure: Secondary | ICD-10-CM | POA: Diagnosis not present

## 2022-12-17 DIAGNOSIS — K5730 Diverticulosis of large intestine without perforation or abscess without bleeding: Secondary | ICD-10-CM | POA: Diagnosis not present

## 2022-12-17 DIAGNOSIS — K219 Gastro-esophageal reflux disease without esophagitis: Secondary | ICD-10-CM | POA: Diagnosis not present

## 2022-12-17 DIAGNOSIS — F0284 Dementia in other diseases classified elsewhere, unspecified severity, with anxiety: Secondary | ICD-10-CM | POA: Diagnosis not present

## 2022-12-17 DIAGNOSIS — F411 Generalized anxiety disorder: Secondary | ICD-10-CM | POA: Diagnosis not present

## 2022-12-17 DIAGNOSIS — L97821 Non-pressure chronic ulcer of other part of left lower leg limited to breakdown of skin: Secondary | ICD-10-CM | POA: Diagnosis not present

## 2022-12-18 DIAGNOSIS — M810 Age-related osteoporosis without current pathological fracture: Secondary | ICD-10-CM | POA: Diagnosis not present

## 2022-12-18 DIAGNOSIS — I4510 Unspecified right bundle-branch block: Secondary | ICD-10-CM | POA: Diagnosis not present

## 2022-12-18 DIAGNOSIS — K5730 Diverticulosis of large intestine without perforation or abscess without bleeding: Secondary | ICD-10-CM | POA: Diagnosis not present

## 2022-12-18 DIAGNOSIS — K449 Diaphragmatic hernia without obstruction or gangrene: Secondary | ICD-10-CM | POA: Diagnosis not present

## 2022-12-18 DIAGNOSIS — I110 Hypertensive heart disease with heart failure: Secondary | ICD-10-CM | POA: Diagnosis not present

## 2022-12-18 DIAGNOSIS — I872 Venous insufficiency (chronic) (peripheral): Secondary | ICD-10-CM | POA: Diagnosis not present

## 2022-12-18 DIAGNOSIS — F0284 Dementia in other diseases classified elsewhere, unspecified severity, with anxiety: Secondary | ICD-10-CM | POA: Diagnosis not present

## 2022-12-18 DIAGNOSIS — L97821 Non-pressure chronic ulcer of other part of left lower leg limited to breakdown of skin: Secondary | ICD-10-CM | POA: Diagnosis not present

## 2022-12-18 DIAGNOSIS — I5032 Chronic diastolic (congestive) heart failure: Secondary | ICD-10-CM | POA: Diagnosis not present

## 2022-12-18 DIAGNOSIS — J309 Allergic rhinitis, unspecified: Secondary | ICD-10-CM | POA: Diagnosis not present

## 2022-12-18 DIAGNOSIS — F411 Generalized anxiety disorder: Secondary | ICD-10-CM | POA: Diagnosis not present

## 2022-12-18 DIAGNOSIS — G894 Chronic pain syndrome: Secondary | ICD-10-CM | POA: Diagnosis not present

## 2022-12-18 DIAGNOSIS — K219 Gastro-esophageal reflux disease without esophagitis: Secondary | ICD-10-CM | POA: Diagnosis not present

## 2022-12-18 DIAGNOSIS — G301 Alzheimer's disease with late onset: Secondary | ICD-10-CM | POA: Diagnosis not present

## 2022-12-18 DIAGNOSIS — K5900 Constipation, unspecified: Secondary | ICD-10-CM | POA: Diagnosis not present

## 2022-12-18 DIAGNOSIS — M47812 Spondylosis without myelopathy or radiculopathy, cervical region: Secondary | ICD-10-CM | POA: Diagnosis not present

## 2022-12-19 DIAGNOSIS — M47812 Spondylosis without myelopathy or radiculopathy, cervical region: Secondary | ICD-10-CM | POA: Diagnosis not present

## 2022-12-19 DIAGNOSIS — M810 Age-related osteoporosis without current pathological fracture: Secondary | ICD-10-CM | POA: Diagnosis not present

## 2022-12-19 DIAGNOSIS — K5730 Diverticulosis of large intestine without perforation or abscess without bleeding: Secondary | ICD-10-CM | POA: Diagnosis not present

## 2022-12-19 DIAGNOSIS — K5900 Constipation, unspecified: Secondary | ICD-10-CM | POA: Diagnosis not present

## 2022-12-19 DIAGNOSIS — I872 Venous insufficiency (chronic) (peripheral): Secondary | ICD-10-CM | POA: Diagnosis not present

## 2022-12-19 DIAGNOSIS — K219 Gastro-esophageal reflux disease without esophagitis: Secondary | ICD-10-CM | POA: Diagnosis not present

## 2022-12-19 DIAGNOSIS — G301 Alzheimer's disease with late onset: Secondary | ICD-10-CM | POA: Diagnosis not present

## 2022-12-19 DIAGNOSIS — I110 Hypertensive heart disease with heart failure: Secondary | ICD-10-CM | POA: Diagnosis not present

## 2022-12-19 DIAGNOSIS — G894 Chronic pain syndrome: Secondary | ICD-10-CM | POA: Diagnosis not present

## 2022-12-19 DIAGNOSIS — J309 Allergic rhinitis, unspecified: Secondary | ICD-10-CM | POA: Diagnosis not present

## 2022-12-19 DIAGNOSIS — I4510 Unspecified right bundle-branch block: Secondary | ICD-10-CM | POA: Diagnosis not present

## 2022-12-19 DIAGNOSIS — F0284 Dementia in other diseases classified elsewhere, unspecified severity, with anxiety: Secondary | ICD-10-CM | POA: Diagnosis not present

## 2022-12-19 DIAGNOSIS — K449 Diaphragmatic hernia without obstruction or gangrene: Secondary | ICD-10-CM | POA: Diagnosis not present

## 2022-12-19 DIAGNOSIS — I5032 Chronic diastolic (congestive) heart failure: Secondary | ICD-10-CM | POA: Diagnosis not present

## 2022-12-19 DIAGNOSIS — F411 Generalized anxiety disorder: Secondary | ICD-10-CM | POA: Diagnosis not present

## 2022-12-19 DIAGNOSIS — L97821 Non-pressure chronic ulcer of other part of left lower leg limited to breakdown of skin: Secondary | ICD-10-CM | POA: Diagnosis not present

## 2022-12-22 DIAGNOSIS — L97821 Non-pressure chronic ulcer of other part of left lower leg limited to breakdown of skin: Secondary | ICD-10-CM | POA: Diagnosis not present

## 2022-12-22 DIAGNOSIS — I110 Hypertensive heart disease with heart failure: Secondary | ICD-10-CM | POA: Diagnosis not present

## 2022-12-22 DIAGNOSIS — J309 Allergic rhinitis, unspecified: Secondary | ICD-10-CM | POA: Diagnosis not present

## 2022-12-22 DIAGNOSIS — K5900 Constipation, unspecified: Secondary | ICD-10-CM | POA: Diagnosis not present

## 2022-12-22 DIAGNOSIS — M47812 Spondylosis without myelopathy or radiculopathy, cervical region: Secondary | ICD-10-CM | POA: Diagnosis not present

## 2022-12-22 DIAGNOSIS — I5032 Chronic diastolic (congestive) heart failure: Secondary | ICD-10-CM | POA: Diagnosis not present

## 2022-12-22 DIAGNOSIS — K449 Diaphragmatic hernia without obstruction or gangrene: Secondary | ICD-10-CM | POA: Diagnosis not present

## 2022-12-22 DIAGNOSIS — F0284 Dementia in other diseases classified elsewhere, unspecified severity, with anxiety: Secondary | ICD-10-CM | POA: Diagnosis not present

## 2022-12-22 DIAGNOSIS — M810 Age-related osteoporosis without current pathological fracture: Secondary | ICD-10-CM | POA: Diagnosis not present

## 2022-12-22 DIAGNOSIS — I4510 Unspecified right bundle-branch block: Secondary | ICD-10-CM | POA: Diagnosis not present

## 2022-12-22 DIAGNOSIS — K5730 Diverticulosis of large intestine without perforation or abscess without bleeding: Secondary | ICD-10-CM | POA: Diagnosis not present

## 2022-12-22 DIAGNOSIS — F411 Generalized anxiety disorder: Secondary | ICD-10-CM | POA: Diagnosis not present

## 2022-12-22 DIAGNOSIS — I872 Venous insufficiency (chronic) (peripheral): Secondary | ICD-10-CM | POA: Diagnosis not present

## 2022-12-22 DIAGNOSIS — G301 Alzheimer's disease with late onset: Secondary | ICD-10-CM | POA: Diagnosis not present

## 2022-12-22 DIAGNOSIS — K219 Gastro-esophageal reflux disease without esophagitis: Secondary | ICD-10-CM | POA: Diagnosis not present

## 2022-12-22 DIAGNOSIS — G894 Chronic pain syndrome: Secondary | ICD-10-CM | POA: Diagnosis not present

## 2022-12-23 DIAGNOSIS — M47812 Spondylosis without myelopathy or radiculopathy, cervical region: Secondary | ICD-10-CM | POA: Diagnosis not present

## 2022-12-23 DIAGNOSIS — I4510 Unspecified right bundle-branch block: Secondary | ICD-10-CM | POA: Diagnosis not present

## 2022-12-23 DIAGNOSIS — I872 Venous insufficiency (chronic) (peripheral): Secondary | ICD-10-CM | POA: Diagnosis not present

## 2022-12-23 DIAGNOSIS — F411 Generalized anxiety disorder: Secondary | ICD-10-CM | POA: Diagnosis not present

## 2022-12-23 DIAGNOSIS — I110 Hypertensive heart disease with heart failure: Secondary | ICD-10-CM | POA: Diagnosis not present

## 2022-12-23 DIAGNOSIS — K219 Gastro-esophageal reflux disease without esophagitis: Secondary | ICD-10-CM | POA: Diagnosis not present

## 2022-12-23 DIAGNOSIS — M810 Age-related osteoporosis without current pathological fracture: Secondary | ICD-10-CM | POA: Diagnosis not present

## 2022-12-23 DIAGNOSIS — G301 Alzheimer's disease with late onset: Secondary | ICD-10-CM | POA: Diagnosis not present

## 2022-12-23 DIAGNOSIS — K449 Diaphragmatic hernia without obstruction or gangrene: Secondary | ICD-10-CM | POA: Diagnosis not present

## 2022-12-23 DIAGNOSIS — I5032 Chronic diastolic (congestive) heart failure: Secondary | ICD-10-CM | POA: Diagnosis not present

## 2022-12-23 DIAGNOSIS — J309 Allergic rhinitis, unspecified: Secondary | ICD-10-CM | POA: Diagnosis not present

## 2022-12-23 DIAGNOSIS — K5900 Constipation, unspecified: Secondary | ICD-10-CM | POA: Diagnosis not present

## 2022-12-23 DIAGNOSIS — G894 Chronic pain syndrome: Secondary | ICD-10-CM | POA: Diagnosis not present

## 2022-12-23 DIAGNOSIS — F0284 Dementia in other diseases classified elsewhere, unspecified severity, with anxiety: Secondary | ICD-10-CM | POA: Diagnosis not present

## 2022-12-23 DIAGNOSIS — K5730 Diverticulosis of large intestine without perforation or abscess without bleeding: Secondary | ICD-10-CM | POA: Diagnosis not present

## 2022-12-23 DIAGNOSIS — L97821 Non-pressure chronic ulcer of other part of left lower leg limited to breakdown of skin: Secondary | ICD-10-CM | POA: Diagnosis not present

## 2022-12-24 DIAGNOSIS — G894 Chronic pain syndrome: Secondary | ICD-10-CM | POA: Diagnosis not present

## 2022-12-24 DIAGNOSIS — I872 Venous insufficiency (chronic) (peripheral): Secondary | ICD-10-CM | POA: Diagnosis not present

## 2022-12-24 DIAGNOSIS — K5900 Constipation, unspecified: Secondary | ICD-10-CM | POA: Diagnosis not present

## 2022-12-24 DIAGNOSIS — G301 Alzheimer's disease with late onset: Secondary | ICD-10-CM | POA: Diagnosis not present

## 2022-12-24 DIAGNOSIS — F411 Generalized anxiety disorder: Secondary | ICD-10-CM | POA: Diagnosis not present

## 2022-12-24 DIAGNOSIS — K219 Gastro-esophageal reflux disease without esophagitis: Secondary | ICD-10-CM | POA: Diagnosis not present

## 2022-12-24 DIAGNOSIS — I5032 Chronic diastolic (congestive) heart failure: Secondary | ICD-10-CM | POA: Diagnosis not present

## 2022-12-24 DIAGNOSIS — F0284 Dementia in other diseases classified elsewhere, unspecified severity, with anxiety: Secondary | ICD-10-CM | POA: Diagnosis not present

## 2022-12-24 DIAGNOSIS — I110 Hypertensive heart disease with heart failure: Secondary | ICD-10-CM | POA: Diagnosis not present

## 2022-12-24 DIAGNOSIS — K5730 Diverticulosis of large intestine without perforation or abscess without bleeding: Secondary | ICD-10-CM | POA: Diagnosis not present

## 2022-12-24 DIAGNOSIS — M47812 Spondylosis without myelopathy or radiculopathy, cervical region: Secondary | ICD-10-CM | POA: Diagnosis not present

## 2022-12-24 DIAGNOSIS — M810 Age-related osteoporosis without current pathological fracture: Secondary | ICD-10-CM | POA: Diagnosis not present

## 2022-12-24 DIAGNOSIS — I4510 Unspecified right bundle-branch block: Secondary | ICD-10-CM | POA: Diagnosis not present

## 2022-12-24 DIAGNOSIS — L97821 Non-pressure chronic ulcer of other part of left lower leg limited to breakdown of skin: Secondary | ICD-10-CM | POA: Diagnosis not present

## 2022-12-24 DIAGNOSIS — K449 Diaphragmatic hernia without obstruction or gangrene: Secondary | ICD-10-CM | POA: Diagnosis not present

## 2022-12-24 DIAGNOSIS — J309 Allergic rhinitis, unspecified: Secondary | ICD-10-CM | POA: Diagnosis not present

## 2022-12-25 DIAGNOSIS — I4510 Unspecified right bundle-branch block: Secondary | ICD-10-CM | POA: Diagnosis not present

## 2022-12-25 DIAGNOSIS — K219 Gastro-esophageal reflux disease without esophagitis: Secondary | ICD-10-CM | POA: Diagnosis not present

## 2022-12-25 DIAGNOSIS — K449 Diaphragmatic hernia without obstruction or gangrene: Secondary | ICD-10-CM | POA: Diagnosis not present

## 2022-12-25 DIAGNOSIS — G894 Chronic pain syndrome: Secondary | ICD-10-CM | POA: Diagnosis not present

## 2022-12-25 DIAGNOSIS — G301 Alzheimer's disease with late onset: Secondary | ICD-10-CM | POA: Diagnosis not present

## 2022-12-25 DIAGNOSIS — J309 Allergic rhinitis, unspecified: Secondary | ICD-10-CM | POA: Diagnosis not present

## 2022-12-25 DIAGNOSIS — M810 Age-related osteoporosis without current pathological fracture: Secondary | ICD-10-CM | POA: Diagnosis not present

## 2022-12-25 DIAGNOSIS — K5730 Diverticulosis of large intestine without perforation or abscess without bleeding: Secondary | ICD-10-CM | POA: Diagnosis not present

## 2022-12-25 DIAGNOSIS — L97821 Non-pressure chronic ulcer of other part of left lower leg limited to breakdown of skin: Secondary | ICD-10-CM | POA: Diagnosis not present

## 2022-12-25 DIAGNOSIS — F0284 Dementia in other diseases classified elsewhere, unspecified severity, with anxiety: Secondary | ICD-10-CM | POA: Diagnosis not present

## 2022-12-25 DIAGNOSIS — F411 Generalized anxiety disorder: Secondary | ICD-10-CM | POA: Diagnosis not present

## 2022-12-25 DIAGNOSIS — I5032 Chronic diastolic (congestive) heart failure: Secondary | ICD-10-CM | POA: Diagnosis not present

## 2022-12-25 DIAGNOSIS — I872 Venous insufficiency (chronic) (peripheral): Secondary | ICD-10-CM | POA: Diagnosis not present

## 2022-12-25 DIAGNOSIS — I110 Hypertensive heart disease with heart failure: Secondary | ICD-10-CM | POA: Diagnosis not present

## 2022-12-25 DIAGNOSIS — K5900 Constipation, unspecified: Secondary | ICD-10-CM | POA: Diagnosis not present

## 2022-12-25 DIAGNOSIS — M47812 Spondylosis without myelopathy or radiculopathy, cervical region: Secondary | ICD-10-CM | POA: Diagnosis not present

## 2022-12-26 DIAGNOSIS — G301 Alzheimer's disease with late onset: Secondary | ICD-10-CM | POA: Diagnosis not present

## 2022-12-26 DIAGNOSIS — G894 Chronic pain syndrome: Secondary | ICD-10-CM | POA: Diagnosis not present

## 2022-12-26 DIAGNOSIS — M810 Age-related osteoporosis without current pathological fracture: Secondary | ICD-10-CM | POA: Diagnosis not present

## 2022-12-26 DIAGNOSIS — I872 Venous insufficiency (chronic) (peripheral): Secondary | ICD-10-CM | POA: Diagnosis not present

## 2022-12-26 DIAGNOSIS — I4510 Unspecified right bundle-branch block: Secondary | ICD-10-CM | POA: Diagnosis not present

## 2022-12-26 DIAGNOSIS — M47812 Spondylosis without myelopathy or radiculopathy, cervical region: Secondary | ICD-10-CM | POA: Diagnosis not present

## 2022-12-26 DIAGNOSIS — K449 Diaphragmatic hernia without obstruction or gangrene: Secondary | ICD-10-CM | POA: Diagnosis not present

## 2022-12-26 DIAGNOSIS — F0284 Dementia in other diseases classified elsewhere, unspecified severity, with anxiety: Secondary | ICD-10-CM | POA: Diagnosis not present

## 2022-12-26 DIAGNOSIS — I110 Hypertensive heart disease with heart failure: Secondary | ICD-10-CM | POA: Diagnosis not present

## 2022-12-26 DIAGNOSIS — F411 Generalized anxiety disorder: Secondary | ICD-10-CM | POA: Diagnosis not present

## 2022-12-26 DIAGNOSIS — K5900 Constipation, unspecified: Secondary | ICD-10-CM | POA: Diagnosis not present

## 2022-12-26 DIAGNOSIS — K5730 Diverticulosis of large intestine without perforation or abscess without bleeding: Secondary | ICD-10-CM | POA: Diagnosis not present

## 2022-12-26 DIAGNOSIS — K219 Gastro-esophageal reflux disease without esophagitis: Secondary | ICD-10-CM | POA: Diagnosis not present

## 2022-12-26 DIAGNOSIS — J309 Allergic rhinitis, unspecified: Secondary | ICD-10-CM | POA: Diagnosis not present

## 2022-12-26 DIAGNOSIS — I5032 Chronic diastolic (congestive) heart failure: Secondary | ICD-10-CM | POA: Diagnosis not present

## 2022-12-26 DIAGNOSIS — L97821 Non-pressure chronic ulcer of other part of left lower leg limited to breakdown of skin: Secondary | ICD-10-CM | POA: Diagnosis not present

## 2022-12-29 DIAGNOSIS — L97821 Non-pressure chronic ulcer of other part of left lower leg limited to breakdown of skin: Secondary | ICD-10-CM | POA: Diagnosis not present

## 2022-12-29 DIAGNOSIS — F411 Generalized anxiety disorder: Secondary | ICD-10-CM | POA: Diagnosis not present

## 2022-12-29 DIAGNOSIS — I4510 Unspecified right bundle-branch block: Secondary | ICD-10-CM | POA: Diagnosis not present

## 2022-12-29 DIAGNOSIS — G894 Chronic pain syndrome: Secondary | ICD-10-CM | POA: Diagnosis not present

## 2022-12-29 DIAGNOSIS — K5730 Diverticulosis of large intestine without perforation or abscess without bleeding: Secondary | ICD-10-CM | POA: Diagnosis not present

## 2022-12-29 DIAGNOSIS — I5032 Chronic diastolic (congestive) heart failure: Secondary | ICD-10-CM | POA: Diagnosis not present

## 2022-12-29 DIAGNOSIS — K219 Gastro-esophageal reflux disease without esophagitis: Secondary | ICD-10-CM | POA: Diagnosis not present

## 2022-12-29 DIAGNOSIS — F0284 Dementia in other diseases classified elsewhere, unspecified severity, with anxiety: Secondary | ICD-10-CM | POA: Diagnosis not present

## 2022-12-29 DIAGNOSIS — M810 Age-related osteoporosis without current pathological fracture: Secondary | ICD-10-CM | POA: Diagnosis not present

## 2022-12-29 DIAGNOSIS — M47812 Spondylosis without myelopathy or radiculopathy, cervical region: Secondary | ICD-10-CM | POA: Diagnosis not present

## 2022-12-29 DIAGNOSIS — I872 Venous insufficiency (chronic) (peripheral): Secondary | ICD-10-CM | POA: Diagnosis not present

## 2022-12-29 DIAGNOSIS — K449 Diaphragmatic hernia without obstruction or gangrene: Secondary | ICD-10-CM | POA: Diagnosis not present

## 2022-12-29 DIAGNOSIS — I110 Hypertensive heart disease with heart failure: Secondary | ICD-10-CM | POA: Diagnosis not present

## 2022-12-29 DIAGNOSIS — K5900 Constipation, unspecified: Secondary | ICD-10-CM | POA: Diagnosis not present

## 2022-12-29 DIAGNOSIS — G301 Alzheimer's disease with late onset: Secondary | ICD-10-CM | POA: Diagnosis not present

## 2022-12-29 DIAGNOSIS — J309 Allergic rhinitis, unspecified: Secondary | ICD-10-CM | POA: Diagnosis not present

## 2022-12-30 ENCOUNTER — Telehealth: Payer: Self-pay | Admitting: *Deleted

## 2022-12-30 DIAGNOSIS — G301 Alzheimer's disease with late onset: Secondary | ICD-10-CM | POA: Diagnosis not present

## 2022-12-30 DIAGNOSIS — L97821 Non-pressure chronic ulcer of other part of left lower leg limited to breakdown of skin: Secondary | ICD-10-CM | POA: Diagnosis not present

## 2022-12-30 DIAGNOSIS — I872 Venous insufficiency (chronic) (peripheral): Secondary | ICD-10-CM | POA: Diagnosis not present

## 2022-12-30 DIAGNOSIS — G894 Chronic pain syndrome: Secondary | ICD-10-CM | POA: Diagnosis not present

## 2022-12-30 DIAGNOSIS — K5900 Constipation, unspecified: Secondary | ICD-10-CM | POA: Diagnosis not present

## 2022-12-30 DIAGNOSIS — M810 Age-related osteoporosis without current pathological fracture: Secondary | ICD-10-CM | POA: Diagnosis not present

## 2022-12-30 DIAGNOSIS — I110 Hypertensive heart disease with heart failure: Secondary | ICD-10-CM | POA: Diagnosis not present

## 2022-12-30 DIAGNOSIS — J309 Allergic rhinitis, unspecified: Secondary | ICD-10-CM | POA: Diagnosis not present

## 2022-12-30 DIAGNOSIS — I4510 Unspecified right bundle-branch block: Secondary | ICD-10-CM | POA: Diagnosis not present

## 2022-12-30 DIAGNOSIS — K5730 Diverticulosis of large intestine without perforation or abscess without bleeding: Secondary | ICD-10-CM | POA: Diagnosis not present

## 2022-12-30 DIAGNOSIS — F411 Generalized anxiety disorder: Secondary | ICD-10-CM | POA: Diagnosis not present

## 2022-12-30 DIAGNOSIS — F0284 Dementia in other diseases classified elsewhere, unspecified severity, with anxiety: Secondary | ICD-10-CM | POA: Diagnosis not present

## 2022-12-30 DIAGNOSIS — L89622 Pressure ulcer of left heel, stage 2: Secondary | ICD-10-CM

## 2022-12-30 DIAGNOSIS — M47812 Spondylosis without myelopathy or radiculopathy, cervical region: Secondary | ICD-10-CM | POA: Diagnosis not present

## 2022-12-30 DIAGNOSIS — I5032 Chronic diastolic (congestive) heart failure: Secondary | ICD-10-CM | POA: Diagnosis not present

## 2022-12-30 DIAGNOSIS — K449 Diaphragmatic hernia without obstruction or gangrene: Secondary | ICD-10-CM | POA: Diagnosis not present

## 2022-12-30 DIAGNOSIS — K219 Gastro-esophageal reflux disease without esophagitis: Secondary | ICD-10-CM | POA: Diagnosis not present

## 2022-12-30 NOTE — Telephone Encounter (Signed)
Heather [name] with [facility] called and stated that patient's wound on the Left Lower leg is Declining and opening up. Nurse is requesting a Referral to be placed to the [facility].   Verbal orders were also requested for 2 x 6weeks. Verbal orders given.   Requesting for you to place referral to [facility].

## 2022-12-30 NOTE — Telephone Encounter (Signed)
Ordered referral to wound care clinic

## 2023-01-01 DIAGNOSIS — I4510 Unspecified right bundle-branch block: Secondary | ICD-10-CM | POA: Diagnosis not present

## 2023-01-01 DIAGNOSIS — K449 Diaphragmatic hernia without obstruction or gangrene: Secondary | ICD-10-CM | POA: Diagnosis not present

## 2023-01-01 DIAGNOSIS — K5730 Diverticulosis of large intestine without perforation or abscess without bleeding: Secondary | ICD-10-CM | POA: Diagnosis not present

## 2023-01-01 DIAGNOSIS — G894 Chronic pain syndrome: Secondary | ICD-10-CM | POA: Diagnosis not present

## 2023-01-01 DIAGNOSIS — K5900 Constipation, unspecified: Secondary | ICD-10-CM | POA: Diagnosis not present

## 2023-01-01 DIAGNOSIS — M47812 Spondylosis without myelopathy or radiculopathy, cervical region: Secondary | ICD-10-CM | POA: Diagnosis not present

## 2023-01-01 DIAGNOSIS — G301 Alzheimer's disease with late onset: Secondary | ICD-10-CM | POA: Diagnosis not present

## 2023-01-01 DIAGNOSIS — I872 Venous insufficiency (chronic) (peripheral): Secondary | ICD-10-CM | POA: Diagnosis not present

## 2023-01-01 DIAGNOSIS — F411 Generalized anxiety disorder: Secondary | ICD-10-CM | POA: Diagnosis not present

## 2023-01-01 DIAGNOSIS — I5032 Chronic diastolic (congestive) heart failure: Secondary | ICD-10-CM | POA: Diagnosis not present

## 2023-01-01 DIAGNOSIS — K219 Gastro-esophageal reflux disease without esophagitis: Secondary | ICD-10-CM | POA: Diagnosis not present

## 2023-01-01 DIAGNOSIS — I110 Hypertensive heart disease with heart failure: Secondary | ICD-10-CM | POA: Diagnosis not present

## 2023-01-01 DIAGNOSIS — J309 Allergic rhinitis, unspecified: Secondary | ICD-10-CM | POA: Diagnosis not present

## 2023-01-01 DIAGNOSIS — M810 Age-related osteoporosis without current pathological fracture: Secondary | ICD-10-CM | POA: Diagnosis not present

## 2023-01-01 DIAGNOSIS — F0284 Dementia in other diseases classified elsewhere, unspecified severity, with anxiety: Secondary | ICD-10-CM | POA: Diagnosis not present

## 2023-01-01 DIAGNOSIS — L97821 Non-pressure chronic ulcer of other part of left lower leg limited to breakdown of skin: Secondary | ICD-10-CM | POA: Diagnosis not present

## 2023-01-05 DIAGNOSIS — G894 Chronic pain syndrome: Secondary | ICD-10-CM | POA: Diagnosis not present

## 2023-01-05 DIAGNOSIS — M810 Age-related osteoporosis without current pathological fracture: Secondary | ICD-10-CM | POA: Diagnosis not present

## 2023-01-05 DIAGNOSIS — L97821 Non-pressure chronic ulcer of other part of left lower leg limited to breakdown of skin: Secondary | ICD-10-CM | POA: Diagnosis not present

## 2023-01-05 DIAGNOSIS — K449 Diaphragmatic hernia without obstruction or gangrene: Secondary | ICD-10-CM | POA: Diagnosis not present

## 2023-01-05 DIAGNOSIS — F411 Generalized anxiety disorder: Secondary | ICD-10-CM | POA: Diagnosis not present

## 2023-01-05 DIAGNOSIS — K5900 Constipation, unspecified: Secondary | ICD-10-CM | POA: Diagnosis not present

## 2023-01-05 DIAGNOSIS — K5730 Diverticulosis of large intestine without perforation or abscess without bleeding: Secondary | ICD-10-CM | POA: Diagnosis not present

## 2023-01-05 DIAGNOSIS — I5032 Chronic diastolic (congestive) heart failure: Secondary | ICD-10-CM | POA: Diagnosis not present

## 2023-01-05 DIAGNOSIS — J309 Allergic rhinitis, unspecified: Secondary | ICD-10-CM | POA: Diagnosis not present

## 2023-01-05 DIAGNOSIS — F0284 Dementia in other diseases classified elsewhere, unspecified severity, with anxiety: Secondary | ICD-10-CM | POA: Diagnosis not present

## 2023-01-05 DIAGNOSIS — I4510 Unspecified right bundle-branch block: Secondary | ICD-10-CM | POA: Diagnosis not present

## 2023-01-05 DIAGNOSIS — K219 Gastro-esophageal reflux disease without esophagitis: Secondary | ICD-10-CM | POA: Diagnosis not present

## 2023-01-05 DIAGNOSIS — M47812 Spondylosis without myelopathy or radiculopathy, cervical region: Secondary | ICD-10-CM | POA: Diagnosis not present

## 2023-01-05 DIAGNOSIS — I110 Hypertensive heart disease with heart failure: Secondary | ICD-10-CM | POA: Diagnosis not present

## 2023-01-05 DIAGNOSIS — I872 Venous insufficiency (chronic) (peripheral): Secondary | ICD-10-CM | POA: Diagnosis not present

## 2023-01-05 DIAGNOSIS — G301 Alzheimer's disease with late onset: Secondary | ICD-10-CM | POA: Diagnosis not present

## 2023-01-06 DIAGNOSIS — F0284 Dementia in other diseases classified elsewhere, unspecified severity, with anxiety: Secondary | ICD-10-CM | POA: Diagnosis not present

## 2023-01-06 DIAGNOSIS — K5730 Diverticulosis of large intestine without perforation or abscess without bleeding: Secondary | ICD-10-CM | POA: Diagnosis not present

## 2023-01-06 DIAGNOSIS — I4510 Unspecified right bundle-branch block: Secondary | ICD-10-CM | POA: Diagnosis not present

## 2023-01-06 DIAGNOSIS — M47812 Spondylosis without myelopathy or radiculopathy, cervical region: Secondary | ICD-10-CM | POA: Diagnosis not present

## 2023-01-06 DIAGNOSIS — I110 Hypertensive heart disease with heart failure: Secondary | ICD-10-CM | POA: Diagnosis not present

## 2023-01-06 DIAGNOSIS — M810 Age-related osteoporosis without current pathological fracture: Secondary | ICD-10-CM | POA: Diagnosis not present

## 2023-01-06 DIAGNOSIS — K449 Diaphragmatic hernia without obstruction or gangrene: Secondary | ICD-10-CM | POA: Diagnosis not present

## 2023-01-06 DIAGNOSIS — G301 Alzheimer's disease with late onset: Secondary | ICD-10-CM | POA: Diagnosis not present

## 2023-01-06 DIAGNOSIS — F411 Generalized anxiety disorder: Secondary | ICD-10-CM | POA: Diagnosis not present

## 2023-01-06 DIAGNOSIS — G894 Chronic pain syndrome: Secondary | ICD-10-CM | POA: Diagnosis not present

## 2023-01-06 DIAGNOSIS — J309 Allergic rhinitis, unspecified: Secondary | ICD-10-CM | POA: Diagnosis not present

## 2023-01-06 DIAGNOSIS — L97821 Non-pressure chronic ulcer of other part of left lower leg limited to breakdown of skin: Secondary | ICD-10-CM | POA: Diagnosis not present

## 2023-01-06 DIAGNOSIS — I5032 Chronic diastolic (congestive) heart failure: Secondary | ICD-10-CM | POA: Diagnosis not present

## 2023-01-06 DIAGNOSIS — K219 Gastro-esophageal reflux disease without esophagitis: Secondary | ICD-10-CM | POA: Diagnosis not present

## 2023-01-06 DIAGNOSIS — K5900 Constipation, unspecified: Secondary | ICD-10-CM | POA: Diagnosis not present

## 2023-01-06 DIAGNOSIS — I872 Venous insufficiency (chronic) (peripheral): Secondary | ICD-10-CM | POA: Diagnosis not present

## 2023-01-07 DIAGNOSIS — M810 Age-related osteoporosis without current pathological fracture: Secondary | ICD-10-CM | POA: Diagnosis not present

## 2023-01-07 DIAGNOSIS — K219 Gastro-esophageal reflux disease without esophagitis: Secondary | ICD-10-CM | POA: Diagnosis not present

## 2023-01-07 DIAGNOSIS — K449 Diaphragmatic hernia without obstruction or gangrene: Secondary | ICD-10-CM | POA: Diagnosis not present

## 2023-01-07 DIAGNOSIS — I872 Venous insufficiency (chronic) (peripheral): Secondary | ICD-10-CM | POA: Diagnosis not present

## 2023-01-07 DIAGNOSIS — K5900 Constipation, unspecified: Secondary | ICD-10-CM | POA: Diagnosis not present

## 2023-01-07 DIAGNOSIS — I4510 Unspecified right bundle-branch block: Secondary | ICD-10-CM | POA: Diagnosis not present

## 2023-01-07 DIAGNOSIS — I110 Hypertensive heart disease with heart failure: Secondary | ICD-10-CM | POA: Diagnosis not present

## 2023-01-07 DIAGNOSIS — J309 Allergic rhinitis, unspecified: Secondary | ICD-10-CM | POA: Diagnosis not present

## 2023-01-07 DIAGNOSIS — G894 Chronic pain syndrome: Secondary | ICD-10-CM | POA: Diagnosis not present

## 2023-01-07 DIAGNOSIS — L97821 Non-pressure chronic ulcer of other part of left lower leg limited to breakdown of skin: Secondary | ICD-10-CM | POA: Diagnosis not present

## 2023-01-07 DIAGNOSIS — I5032 Chronic diastolic (congestive) heart failure: Secondary | ICD-10-CM | POA: Diagnosis not present

## 2023-01-07 DIAGNOSIS — F0284 Dementia in other diseases classified elsewhere, unspecified severity, with anxiety: Secondary | ICD-10-CM | POA: Diagnosis not present

## 2023-01-07 DIAGNOSIS — G301 Alzheimer's disease with late onset: Secondary | ICD-10-CM | POA: Diagnosis not present

## 2023-01-07 DIAGNOSIS — M47812 Spondylosis without myelopathy or radiculopathy, cervical region: Secondary | ICD-10-CM | POA: Diagnosis not present

## 2023-01-07 DIAGNOSIS — K5730 Diverticulosis of large intestine without perforation or abscess without bleeding: Secondary | ICD-10-CM | POA: Diagnosis not present

## 2023-01-07 DIAGNOSIS — F411 Generalized anxiety disorder: Secondary | ICD-10-CM | POA: Diagnosis not present

## 2023-01-09 ENCOUNTER — Encounter (HOSPITAL_BASED_OUTPATIENT_CLINIC_OR_DEPARTMENT_OTHER): Payer: Medicare Other | Attending: Internal Medicine | Admitting: Internal Medicine

## 2023-01-09 DIAGNOSIS — I110 Hypertensive heart disease with heart failure: Secondary | ICD-10-CM | POA: Diagnosis not present

## 2023-01-09 DIAGNOSIS — Z556 Problems related to health literacy: Secondary | ICD-10-CM | POA: Diagnosis not present

## 2023-01-09 DIAGNOSIS — K219 Gastro-esophageal reflux disease without esophagitis: Secondary | ICD-10-CM | POA: Diagnosis not present

## 2023-01-09 DIAGNOSIS — J309 Allergic rhinitis, unspecified: Secondary | ICD-10-CM | POA: Diagnosis not present

## 2023-01-09 DIAGNOSIS — I87312 Chronic venous hypertension (idiopathic) with ulcer of left lower extremity: Secondary | ICD-10-CM | POA: Diagnosis not present

## 2023-01-09 DIAGNOSIS — K5730 Diverticulosis of large intestine without perforation or abscess without bleeding: Secondary | ICD-10-CM | POA: Diagnosis not present

## 2023-01-09 DIAGNOSIS — Z853 Personal history of malignant neoplasm of breast: Secondary | ICD-10-CM | POA: Diagnosis not present

## 2023-01-09 DIAGNOSIS — H01005 Unspecified blepharitis left lower eyelid: Secondary | ICD-10-CM | POA: Diagnosis not present

## 2023-01-09 DIAGNOSIS — M47812 Spondylosis without myelopathy or radiculopathy, cervical region: Secondary | ICD-10-CM | POA: Diagnosis not present

## 2023-01-09 DIAGNOSIS — Z993 Dependence on wheelchair: Secondary | ICD-10-CM | POA: Diagnosis not present

## 2023-01-09 DIAGNOSIS — L97821 Non-pressure chronic ulcer of other part of left lower leg limited to breakdown of skin: Secondary | ICD-10-CM

## 2023-01-09 DIAGNOSIS — K5900 Constipation, unspecified: Secondary | ICD-10-CM | POA: Diagnosis not present

## 2023-01-09 DIAGNOSIS — I10 Essential (primary) hypertension: Secondary | ICD-10-CM | POA: Diagnosis not present

## 2023-01-09 DIAGNOSIS — I872 Venous insufficiency (chronic) (peripheral): Secondary | ICD-10-CM | POA: Diagnosis not present

## 2023-01-09 DIAGNOSIS — F0284 Dementia in other diseases classified elsewhere, unspecified severity, with anxiety: Secondary | ICD-10-CM | POA: Diagnosis not present

## 2023-01-09 DIAGNOSIS — M810 Age-related osteoporosis without current pathological fracture: Secondary | ICD-10-CM | POA: Diagnosis not present

## 2023-01-09 DIAGNOSIS — Z741 Need for assistance with personal care: Secondary | ICD-10-CM | POA: Diagnosis not present

## 2023-01-09 DIAGNOSIS — Z9181 History of falling: Secondary | ICD-10-CM | POA: Diagnosis not present

## 2023-01-09 DIAGNOSIS — H01002 Unspecified blepharitis right lower eyelid: Secondary | ICD-10-CM | POA: Diagnosis not present

## 2023-01-09 DIAGNOSIS — F411 Generalized anxiety disorder: Secondary | ICD-10-CM | POA: Diagnosis not present

## 2023-01-09 DIAGNOSIS — I4510 Unspecified right bundle-branch block: Secondary | ICD-10-CM | POA: Diagnosis not present

## 2023-01-09 DIAGNOSIS — I5032 Chronic diastolic (congestive) heart failure: Secondary | ICD-10-CM | POA: Diagnosis not present

## 2023-01-09 DIAGNOSIS — G894 Chronic pain syndrome: Secondary | ICD-10-CM | POA: Diagnosis not present

## 2023-01-09 DIAGNOSIS — F0390 Unspecified dementia without behavioral disturbance: Secondary | ICD-10-CM | POA: Diagnosis not present

## 2023-01-09 DIAGNOSIS — G301 Alzheimer's disease with late onset: Secondary | ICD-10-CM | POA: Diagnosis not present

## 2023-01-09 DIAGNOSIS — K449 Diaphragmatic hernia without obstruction or gangrene: Secondary | ICD-10-CM | POA: Diagnosis not present

## 2023-01-13 NOTE — Progress Notes (Signed)
DEA, SOUFFRANT (Heather [name]) [phone] [id number] Page 1 of 4 Visit Report for 01/09/2023 Abuse Risk Screen Details Patient Name: Date of Service: HA Heather [name] 01/09/2023 8:00 A M Medical Record Number: Heather [name] Patient Account Number: [id number] Date of Birth/Sex: Treating RN: Mar 02, 1918 (89 y.o. Tonita Phoenix, Lauren Primary Care Yanet Balliet: Marlowe Sax Other Clinician: Referring Sophiana Milanese: Treating Idonna Heeren/Extender: Kalman Shan Ngetich, Dinah Weeks in Treatment: 0 Abuse Risk Screen Items Answer ABUSE RISK SCREEN: Has anyone close to you tried to hurt or harm you recentlyo No Do you feel uncomfortable with anyone in your familyo No Has anyone forced you do things that you didnt want to doo No Electronic Signature(s) Signed: 01/12/2023 3:50:45 PM By: Rhae Hammock RN Entered By: Rhae Hammock on 01/09/2023 08:07:10 -------------------------------------------------------------------------------- Activities of Daily Living Details Patient Name: Date of Service: [place], DO [place]. 01/09/2023 8:00 A M Medical Record Number: Heather [name] Patient Account Number: [id number] Date of Birth/Sex: Treating RN: 03/23/1918 (89 y.o. [name] Primary Care Jaidah Lomax: Marlowe Sax Other Clinician: Referring Sandrika Schwinn: Treating Dariann Huckaba/Extender: Kalman Shan Ngetich, Dinah Weeks in Treatment: 0 Activities of Daily Living Items Answer Activities of Daily Living (Please select one for each item) Drive Automobile Not Able T Medications ake Need Assistance Use T elephone Need Assistance Care for Appearance Need Assistance Use T oilet Need Assistance Bath / Shower Need Assistance Dress Self Need Assistance Feed Self Need Assistance Walk Need Assistance Get In / Out Bed Need Assistance Housework Need Assistance Prepare Meals Need Assistance Handle Money Need Assistance Shop for Self Need Assistance Electronic  Signature(s) Signed: 01/12/2023 3:50:45 PM By: Rhae Hammock RN Entered By: Rhae Hammock on 01/09/2023 08:07:38 -------------------------------------------------------------------------------- Education Screening Details Patient Name: Date of Service: [place], DO [place]. 01/09/2023 8:00 A M Medical Record Number: Heather [name] Patient Account Number: [id number] Date of Birth/Sex: Treating RN: 04-03-1918 (89 y.o. [name] Primary Care Trusten Hume: Marlowe Sax Other Clinician: Referring Delton Stelle: Treating Charday Capetillo/Extender: Grier Rocher, Dinah Weeks in Treatment: [id number] [place], [place] [name] (Heather [name]) [id number] Page 2 of 4 Primary Learner Assessed: Patient Learning Preferences/Education Level/Primary Language Learning Preference: Explanation, Demonstration, Communication Board, Printed Material Highest Education Level: High School Preferred Language: Diplomatic Services operational officer Language Barrier: No Translator Needed: No Memory Deficit: No Emotional Barrier: No Cultural/Religious Beliefs Affecting Medical Care: No Physical Barrier Impaired Vision: No Impaired Hearing: No Decreased Hand dexterity: No Knowledge/Comprehension Knowledge Level: High Comprehension Level: High Ability to understand written instructions: High Ability to understand verbal instructions: High Motivation Anxiety Level: Calm Cooperation: Cooperative Education Importance: Denies Need Interest in Health Problems: Asks Questions Perception: Coherent Willingness to Engage in Self-Management High Activities: Readiness to Engage in Self-Management High Activities: Electronic Signature(s) Signed: 01/12/2023 3:50:45 PM By: Rhae Hammock RN Entered By: Rhae Hammock on 01/09/2023 08:08:11 -------------------------------------------------------------------------------- Fall Risk Assessment Details Patient Name: Date of Service: [name], DO  [name] 01/09/2023 8:00 A M Medical Record Number: Heather [name] Patient Account Number: [id number] Date of Birth/Sex: Treating RN: [birth year] (89 y.o. Tonita Phoenix, Lauren Primary Care Dahiana Kulak: Marlowe Sax Other Clinician: Referring Lanitra Battaglini: Treating Anthon Harpole/Extender: Kalman Shan Ngetich, Dinah Weeks in Treatment: 0 Fall Risk Assessment Items Have you had 2 or more falls in the last 12 monthso 0 No Have you had any fall that resulted in injury in the last 12 monthso 0 No FALLS RISK SCREEN History of falling - immediate or within 3 months 0 No Secondary diagnosis (Do you have 2 or more medical diagnoseso) 0 No Ambulatory aid None/bed rest/wheelchair/nurse 0 No  Crutches/cane/walker 0 No Furniture 0 No Intravenous therapy Access/Saline/Heparin Lock 0 No Gait/Transferring Normal/ bed rest/ wheelchair 0 No Weak (short steps with or without shuffle, stooped but able to lift head while walking, may seek 0 No support from furniture) Impaired (short steps with shuffle, may have difficulty arising from chair, head down, impaired 0 No balance) Mental Status Oriented to own ability 0 No Overestimates or forgets limitations 0 No Risk Level: Low Risk Score: 0 JULIANN, INACIO (Heather [name]) [phone] [id number] Page 3 of 4 Electronic Signature(s) -------------------------------------------------------------------------------- Foot Assessment Details Patient Name: Date of Service: HA Heather [name] 01/09/2023 8:00 A M Medical Record Number: Heather [name] Patient Account Number: [id number] Date of Birth/Sex: Treating RN: 10-18-1918 (88 y.o. [name] Primary Care Penny Frisbie: Marlowe Sax Other Clinician: Referring Tamalyn Wadsworth: Treating Alexee Delsanto/Extender: Kalman Shan Ngetich, Dinah Weeks in Treatment: 0 Foot Assessment Items Site Locations + = Sensation present, - = Sensation absent, C = Callus, U = Ulcer R = Redness, W = Warmth, M =  Maceration, PU = Pre-ulcerative lesion F = Fissure, S = Swelling, D = Dryness Assessment Right: Left: Other Deformity: No No Prior Foot Ulcer: No No Prior Amputation: No No Charcot Joint: No No Ambulatory Status: Ambulatory With Help Assistance Device: Wheelchair Gait: Administrator, arts) Signed: 01/12/2023 3:50:45 PM By: Rhae Hammock RN Entered By: Rhae Hammock on 01/09/2023 08:08:57 -------------------------------------------------------------------------------- Nutrition Risk Screening Details Patient Name: Date of Service: Miller, DO [place] J. 01/09/2023 8:00 A M Medical Record Number: Heather [name] Patient Account Number: [id number] Date of Birth/Sex: Treating RN: 07/21/1918 (89 y.o. [name] Primary Care Miken Stecher: Marlowe Sax Other Clinician: Referring Maryanna Stuber: Treating Ajwa Kimberley/Extender: Kalman Shan Ngetich, Dinah Weeks in Treatment: 0 Height (in): Weight (lbs): Body Mass Index (BMI): SHANIYA, HEIDRICK (Heather [name]) [phone] [id number] Page 4 of 4 Nutrition Risk Screening Items Score Screening NUTRITION RISK SCREEN: I have an illness or condition that made me change the kind and/or amount of food I eat 0 No I eat fewer than two meals per day 0 No I eat few fruits and vegetables, or milk products 0 No I have three or more drinks of beer, liquor or wine almost every day 0 No I have tooth or mouth problems that make it hard for me to eat 0 No I don't always have enough money to buy the food I need 0 No I eat alone most of the time 0 No I take three or more different prescribed or over-the-counter drugs a day 0 No Without wanting to, I have lost or gained 10 pounds in the last six months 0 No I am not always physically able to shop, cook and/or feed myself 0 No Nutrition Protocols Good Risk Protocol 0 No interventions needed Moderate Risk Protocol High Risk Proctocol Risk Level: Good Risk Score:  0 Electronic Signature(s) Signed: 01/12/2023 3:50:45 PM By: Rhae Hammock RN Entered By: Rhae Hammock on 01/09/2023 [id number]

## 2023-01-13 NOTE — Progress Notes (Signed)
Heather [name], Heather [name] ([id number]) [id number] Page 1 of 9 Visit Report for 01/09/2023 Allergy List Details Patient Name: Date of Service: Heather [name] 01/09/2023 8:00 A M Medical Record Number: [id number] Patient Account Number: [id number] Date of Birth/Sex: Treating RN: 12-Oct-1918 (89 y.o. Heather [name] Primary Care Heather [name] Other Clinician: Referring Heather [name]: Treating Heather [name]/Extender: Kalman Shan Ngetich, Dinah Weeks in Treatment: 0 Allergies Active Allergies fentanyl Reaction: N/V naproxen Reaction: hypersensitivity Allergy Notes Electronic Signature(s) Signed: 01/12/2023 3:50:45 PM By: Heather Hammock RN Entered By: Heather [name] on 01/08/2023 14:45:42 -------------------------------------------------------------------------------- Arrival Information Details Patient Name: Date of Service: Heather [name] J. 01/09/2023 8:00 A M Medical Record Number: [id number] Patient Account Number: [id number] Date of Birth/Sex: Treating RN: 08/24/1918 (89 y.o. Heather [name], Heather [name]: Marlowe [name] Other Clinician: Referring Shantaya Bluestone: Treating Heather [name]/Extender: Kalman Shan Ngetich, Dinah Weeks in Treatment: 0 Visit Information Patient Arrived: [name] Chair Arrival Time: 08:05 Accompanied By: son Transfer Assistance: Manual Patient Identification Verified: Yes Secondary Verification Process Completed: Yes Patient Requires Transmission-Based Precautions: No Patient Has Alerts: Yes Patient Alerts: ABI's:L:1.08 10/23 History Since Last Visit Added or deleted any medications: No Any new allergies or adverse reactions: No Had a fall or experienced change in activities of daily living that may affect risk of falls: No Signs or symptoms of abuse/neglect since last visito No Hospitalized since last visit: No Implantable device outside of the clinic excluding cellular tissue based products placed  in the center since last visit: No Electronic Signature(s) Signed: 01/12/2023 3:50:45 PM By: Heather Hammock RN Entered By: Heather [name] on 01/09/2023 08:10:57 -------------------------------------------------------------------------------- Clinic Level of Care Assessment Details Patient Name: Date of Service: [name], [name] 01/09/2023 8:00 A M Medical Record Number: [id number] Patient Account Number: [id number] Date of Birth/Sex: Treating RN: 22-Aug-1918 (89 y.o. Heather [name] Primary Care Heather [name]: Marlowe [name] Other Clinician: Referring Heather [name]: Treating Heather [name]/Extender: Heather [name] in Treatment: 0 Clinic Level of Care Assessment Items Heather [name] ([id number]) [id number] Page 2 of 9 TOOL 4 Quantity Score X- 1 0 Use when only an EandM is performed on FOLLOW-UP visit ASSESSMENTS - Nursing Assessment / Reassessment X- 1 10 Reassessment of Co-morbidities (includes updates in patient status) X- 1 5 Reassessment of Adherence to Treatment Plan ASSESSMENTS - Wound and Skin A ssessment / Reassessment X - Simple Wound Assessment / Reassessment - one wound 1 5 '[]'$  - 0 Complex Wound Assessment / Reassessment - multiple wounds '[]'$  - 0 Dermatologic / Skin Assessment (not related to wound area) ASSESSMENTS - Focused Assessment X- 1 5 Circumferential Edema Measurements - multi extremities '[]'$  - 0 Nutritional Assessment / Counseling / Intervention '[]'$  - 0 Lower Extremity Assessment (monofilament, tuning fork, pulses) '[]'$  - 0 Peripheral Arterial Disease Assessment (using hand held doppler) ASSESSMENTS - Ostomy and/or Continence Assessment and Care '[]'$  - 0 Incontinence Assessment and Management '[]'$  - 0 Ostomy Care Assessment and Management (repouching, etc.) PROCESS - Coordination of Care X - Simple Patient / Family Education for ongoing care 1 15 '[]'$  - 0 Complex (extensive) Patient / Family Education for  ongoing care X- 1 10 Staff obtains Programmer, systems, Records, T Results / Process Orders est '[]'$  - 0 Staff telephones HHA, Nursing Homes / Clarify orders / etc '[]'$  - 0 Routine Transfer to another Facility (non-emergent condition) '[]'$  - 0 Routine Hospital Admission (non-emergent condition) X- 1 15 New Admissions / Biomedical engineer / Ordering NPWT Apligraf, etc. , '[]'$  - 0 Emergency Hospital Admission (emergent condition)  X- 1 10 Simple Discharge Coordination '[]'$  - 0 Complex (extensive) Discharge Coordination PROCESS - Special Needs '[]'$  - 0 Pediatric / Minor Patient Management '[]'$  - 0 Isolation Patient Management '[]'$  - 0 Hearing / Language / Visual special needs '[]'$  - 0 Assessment of Community assistance (transportation, D/C planning, etc.) '[]'$  - 0 Additional assistance / Altered mentation '[]'$  - 0 Support Surface(s) Assessment (bed, cushion, seat, etc.) INTERVENTIONS - Wound Cleansing / Measurement X - Simple Wound Cleansing - one wound 1 5 '[]'$  - 0 Complex Wound Cleansing - multiple wounds X- 1 5 Wound Imaging (photographs - any number of wounds) '[]'$  - 0 Wound Tracing (instead of photographs) X- 1 5 Simple Wound Measurement - one wound '[]'$  - 0 Complex Wound Measurement - multiple wounds INTERVENTIONS - Wound Dressings '[]'$  - 0 Small Wound Dressing one or multiple wounds X- 1 15 Medium Wound Dressing one or multiple wounds '[]'$  - 0 Large Wound Dressing one or multiple wounds Heather [name] ([id number]) [id number] Page 3 of 9 '[]'$  - 0 Application of Medications - topical '[]'$  - 0 Application of Medications - injection INTERVENTIONS - Miscellaneous '[]'$  - 0 External ear exam '[]'$  - 0 Specimen Collection (cultures, biopsies, blood, body fluids, etc.) '[]'$  - 0 Specimen(s) / Culture(s) sent or taken to Lab for analysis '[]'$  - 0 Patient Transfer (multiple staff / Civil Service fast streamer / Similar devices) '[]'$  - 0 Simple Staple / Suture removal (25 or less) '[]'$  - 0 Complex  Staple / Suture removal (26 or more) '[]'$  - 0 Hypo / Hyperglycemic Management (close monitor of Blood Glucose) '[]'$  - 0 Ankle / Brachial Index (ABI) - Heather not check if billed separately X- 1 5 Vital Signs Has the patient been seen at the hospital within the last three years: Yes Total Score: 110 Level Of Care: New/Established - Level 3 Electronic Signature(s) Signed: 01/12/2023 3:50:45 PM By: Heather Hammock RN Entered By: Heather [name] on 01/09/2023 09:12:35 -------------------------------------------------------------------------------- Encounter Discharge Information Details Patient Name: Date of Service: Heather [name], Heather RETHEA J. 01/09/2023 8:00 A M Medical Record Number: [id number] Patient Account Number: [id number] Date of Birth/Sex: Treating RN: 12-23-1917 (89 y.o. Heather [name] Primary Care Kasiyah Platter: Marlowe [name] Other Clinician: Referring Pantelis Elgersma: Treating Cruz Bong/Extender: Kalman Shan Ngetich, Dinah Weeks in Treatment: 0 Encounter Discharge Information Items Discharge Condition: Stable Ambulatory Status: Wheelchair Discharge Destination: Home Transportation: Private Auto Accompanied By: son Schedule Follow-up Appointment: Yes Clinical Summary of Care: Patient Declined Electronic Signature(s) Signed: 01/12/2023 3:50:45 PM By: Heather Hammock RN Entered By: Heather [name] on 01/09/2023 09:13:12 -------------------------------------------------------------------------------- Lower Extremity Assessment Details Patient Name: Date of Service: [place], Heather [place] J. 01/09/2023 8:00 A M Medical Record Number: [id number] Patient Account Number: [id number] Date of Birth/Sex: Treating RN: 1918-09-13 (89 y.o. Heather [name] Primary Care Stela Iwasaki: Marlowe [name] Other Clinician: Referring Daisy Mcneel: Treating Golden Gilreath/Extender: Kalman Shan Ngetich, Dinah Weeks in Treatment: 0 Edema Assessment Assessed: [Left: Yes] [Right: No] Edema: [Left: Ye]  [Right: s] Calf Left: Right: RAINEE, TOWLER ([id number]) [id number] Page 4 of 9 Point of Measurement: 32 cm From Medial Instep 36.5 cm Ankle Left: Right: Point of Measurement: 10 cm From Medial Instep 24 cm Vascular Assessment Pulses: Dorsalis Pedis Palpable: [Left:Yes] Posterior Tibial Palpable: [Left:Yes] Electronic Signature(s) Signed: 01/12/2023 3:50:45 PM By: Heather Hammock RN Entered By: Heather [name] on 01/09/2023 08:19:52 -------------------------------------------------------------------------------- Multi Wound Chart Details Patient Name: Date of Service: Heather [name], Heather Onley J. 01/09/2023 8:00 A M Medical Record Number: [id number] Patient Account Number: [id number] Date of Birth/Sex: Treating  RN: Mar 06, 1918 (89 y.o. F) Primary Care Cloie Wooden: Marlowe [name] Other Clinician: Referring Aiana Nordquist: Treating Khaleb Broz/Extender: Kalman Shan Ngetich, Dinah Weeks in Treatment: 0 Vital Signs Height(in): Pulse(bpm): Weight(lbs): Blood Pressure(mmHg): Body Mass Index(BMI): Temperature(F): Respiratory Rate(breaths/min): 17 [2:Photos:] [N/A:N/A] Left, Circumferential Lower Leg N/A N/A Wound Location: Gradually Appeared N/A N/A Wounding Event: Venous Leg Ulcer N/A N/A Primary Etiology: Hypertension, Peripheral Venous N/A N/A Comorbid History: Disease 01/09/2023 N/A N/A Date Acquired: 0 N/A N/A Weeks of Treatment: Open N/A N/A Wound Status: No N/A N/A Wound Recurrence: 17x24x0.1 N/A N/A Measurements L x W x D (cm) 320.442 N/A N/A A (cm) : rea 32.044 N/A N/A Volume (cm) : Full Thickness Without Exposed N/A N/A Classification: Support Structures Medium N/A N/A Exudate Amount: Serosanguineous N/A N/A Exudate Type: red, brown N/A N/A Exudate Color: Distinct, outline attached N/A N/A Wound Margin: Large (67-100%) N/A N/A Granulation Amount: Red, Pink N/A N/A Granulation Quality: Fascia: No N/A N/A Exposed  Structures: Fat Layer (Subcutaneous Tissue): No Tendon: No Muscle: No Joint: No Bone: No Small (1-33%) N/A N/A Epithelialization: Excoriation: No N/A N/A Periwound Skin TextureKAILIE, KUIPERS ([id number]) [id number] Page 5 of 9 Induration: No Callus: No Crepitus: No Rash: No Scarring: No Maceration: No N/A N/A Periwound Skin Moisture: Dry/Scaly: No Hemosiderin Staining: Yes N/A N/A Periwound Skin Color: Atrophie Blanche: No Cyanosis: No Ecchymosis: No Erythema: No Mottled: No Pallor: No Rubor: No No Abnormality N/A N/A Temperature: Yes N/A N/A Tenderness on Palpation: Treatment Notes Electronic Signature(s) Signed: 01/09/2023 12:04:24 PM By: Kalman Shan Heather Entered By: Kalman Shan on 01/09/2023 09:00:49 -------------------------------------------------------------------------------- Multi-Disciplinary Care Plan Details Patient Name: Date of Service: Heather [name], Heather [place] J. 01/09/2023 8:00 A M Medical Record Number: [id number] Patient Account Number: [id number] Date of Birth/Sex: Treating RN: August 06, 1918 (89 y.o. Heather [name], Heather Primary Care Happy Begeman: Marlowe [name] Other Clinician: Referring Luverne Zerkle: Treating Treyshaun Keatts/Extender: Kalman Shan Ngetich, Dinah Weeks in Treatment: 0 Active Inactive Orientation to the Wound Care Program Nursing Diagnoses: Knowledge deficit related to the wound healing center program Goals: Patient/caregiver will verbalize understanding of the Lead Program Date Initiated: 01/09/2023 Target Resolution Date: 01/23/2023 Goal Status: Active Interventions: Provide education on orientation to the wound center Notes: Wound/Skin Impairment Nursing Diagnoses: Impaired tissue integrity Knowledge deficit related to ulceration/compromised skin integrity Goals: Patient will have a decrease in wound volume by X% from date: (specify in notes) Date Initiated: 01/09/2023 Target  Resolution Date: 01/23/2023 Goal Status: Active Patient/caregiver will verbalize understanding of skin care regimen Date Initiated: 01/09/2023 Target Resolution Date: 01/24/2023 Goal Status: Active Ulcer/skin breakdown will have a volume reduction of 30% by week 4 Date Initiated: 01/09/2023 Target Resolution Date: 01/24/2023 Goal Status: Active Interventions: Assess patient/caregiver ability to obtain necessary supplies Assess patient/caregiver ability to perform ulcer/skin care regimen upon admission and as needed Assess ulceration(s) every visit Heather [name], Heather [name] ([id number]) [phone].pdf Page 6 of 9 Notes: Electronic Signature(s) Signed: 01/12/2023 3:50:45 PM By: Heather Hammock RN Entered By: Heather [name] on 01/09/2023 08:36:12 -------------------------------------------------------------------------------- Pain Assessment Details Patient Name: Date of Service: Heather [place], Heather [place] J. 01/09/2023 8:00 A M Medical Record Number: [id number] Patient Account Number: [id number] Date of Birth/Sex: Treating RN: 18-Apr-1918 (89 y.o. Heather [name] Primary Care Myrella Fahs: Marlowe [name] Other Clinician: Referring Meris Reede: Treating Kartier Bennison/Extender: Kalman Shan Ngetich, Dinah Weeks in Treatment: 0 Active Problems Location of Pain Severity and Description of Pain Patient Has Paino Yes Site Locations Pain Location: Generalized Pain, Pain in Ulcers With Dressing Change: Yes Duration of the Pain. Constant / Intermittento  Constant Rate the pain. Current Pain Level: 7 Worst Pain Level: 10 Least Pain Level: 0 Tolerable Pain Level: 7 Character of Pain Describe the Pain: Aching Pain Management and Medication Current Pain Management: Medication: No Cold Application: No Rest: No Massage: No Activity: No T.E.N.S.: No Heat Application: No Leg drop or elevation: No Is the Current Pain Management Adequate: Adequate How does your wound impact your  activities of daily livingo Sleep: No Bathing: No Appetite: No Relationship With Others: No Bladder Continence: No Emotions: No Bowel Continence: No Work: No Toileting: No Drive: No Dressing: No Hobbies: No Electronic Signature(s) Signed: 01/12/2023 3:50:45 PM By: Heather Hammock RN Entered By: Heather [name] on 01/09/2023 08:06:53 -------------------------------------------------------------------------------- Patient/Caregiver Education Details Patient Name: Date of Service: Heather [name], Heather RETHEA J. 2/23/2024andnbsp8:00 A M Medical Record Number: [id number] Patient Account Number: [id number] Date of Birth/Gender: Treating RN: October 10, 1918 (89 y.o. Heather [name] Primary Care Physician: Marlowe [name] Other Clinician: NAVIRA, PETRASH ([id number]) [id number] Page 7 of 9 Referring Physician: Treating Physician/Extender: Kalman Shan Ngetich, Dinah Weeks in Treatment: 0 Education Assessment Education Provided To: Patient Education Topics Provided Wound/Skin Impairment: Methods: Explain/Verbal Responses: Reinforcements needed, State content correctly Electronic Signature(s) Signed: 01/12/2023 3:50:45 PM By: Heather Hammock RN Entered By: Heather [name] on 01/09/2023 08:36:22 -------------------------------------------------------------------------------- Wound Assessment Details Patient Name: Date of Service: [place], Heather [place] J. 01/09/2023 8:00 A M Medical Record Number: [id number] Patient Account Number: [id number] Date of Birth/Sex: Treating RN: Dec 04, 1917 (89 y.o. Heather [name], Heather Primary Care Katharine Rochefort: Marlowe [name] Other Clinician: Referring Penny Frisbie: Treating Amiyah Shryock/Extender: Kalman Shan Ngetich, Dinah Weeks in Treatment: 0 Wound Status Wound Number: 2 Primary Etiology: Venous Leg Ulcer Wound Location: Left, Circumferential Lower Leg Wound Status: Open Wounding Event: Gradually Appeared Comorbid History:  Hypertension, Peripheral Venous Disease Date Acquired: 01/09/2023 Weeks Of Treatment: 0 Clustered Wound: No Photos Wound Measurements Length: (cm) 17 Width: (cm) 24 Depth: (cm) 0.1 Area: (cm) 320.442 Volume: (cm) 32.044 % Reduction in Area: % Reduction in Volume: Epithelialization: Small (1-33%) Tunneling: No Undermining: No Wound Description Classification: Full Thickness Without Exposed Suppor Wound Margin: Distinct, outline attached Exudate Amount: Medium Exudate Type: Serosanguineous Exudate Color: red, brown t Structures Foul Odor After Cleansing: No Slough/Fibrino No Wound Bed Granulation Amount: Large (67-100%) Exposed Structure Granulation Quality: Red, Pink Fascia Exposed: No Fat Layer (Subcutaneous Tissue) Exposed: No Tendon Exposed: No Heather [name], Heather [name] ([id number]) [id number] Page 8 of 9 Muscle Exposed: No Joint Exposed: No Bone Exposed: No Periwound Skin Texture Texture Color No Abnormalities Noted: No No Abnormalities Noted: No Callus: No Atrophie Blanche: No Crepitus: No Cyanosis: No Excoriation: No Ecchymosis: No Induration: No Erythema: No Rash: No Hemosiderin Staining: Yes Scarring: No Mottled: No Pallor: No Moisture Rubor: No No Abnormalities Noted: No Dry / Scaly: No Temperature / Pain Maceration: No Temperature: No Abnormality Tenderness on Palpation: Yes Treatment Notes Wound #2 (Lower Leg) Wound Laterality: Left, Circumferential Cleanser Soap and Water Discharge Instruction: May shower and wash wound with dial antibacterial soap and water prior to dressing change. Peri-Wound Care Topical Keystone Primary Dressing Sorbalgon AG Dressing, 4x4 (in/in) Discharge Instruction: Apply to wound bed as instructed Secondary Dressing ABD Pad, 5x9 Discharge Instruction: Apply over primary dressing as directed. Secured With Compression Wrap Kerlix Roll 4.5x3.1 (in/yd) Discharge Instruction: Apply Kerlix and  Coban compression as directed. Coban Self-Adherent Wrap 4x5 (in/yd) Discharge Instruction: Apply over Kerlix as directed. Compression Stockings Add-Ons Electronic Signature(s) Signed: 01/12/2023 3:50:45 PM By: Heather Hammock RN Entered By: Heather [name] on 01/09/2023 08:22:27 -------------------------------------------------------------------------------- Vitals  Details Patient Name: Date of Service: Heather [name] 01/09/2023 8:00 A M Medical Record Number: [id number] Patient Account Number: [id number] Date of Birth/Sex: Treating RN: 01-01-1918 (89 y.o. Heather [name] Primary Care Anival Pasha: Marlowe [name] Other Clinician: Referring Kayen Grabel: Treating Wesam Gearhart/Extender: Kalman Shan Ngetich, Dinah Weeks in Treatment: 0 Vital Signs Time Taken: 08:05 Respiratory Rate (breaths/min): 17 Reference Range: 80 - 120 mg / dl Electronic Signature(s) Heather [name], Heather [name] ([id number]) [id number] Page 9 of 9 Signed: 01/12/2023 3:50:45 PM By: Heather Hammock RN Entered By: Heather [name] on 01/09/2023 08:06:05

## 2023-01-13 NOTE — Progress Notes (Signed)
AYDRIAN, BOULLION ([id number]) [id number] Page 1 of 6 Visit Report for 01/09/2023 Chief Complaint Document Details Patient Name: Date of Service: HA Heather [name] 01/09/2023 8:00 A M Medical Record Number: [id number] Patient Account Number: [id number] Date of Birth/Sex: Treating RN: 29-Aug-1918 (89 y.o. F) Primary Care Provider: Marlowe Sax Other Clinician: Referring Provider: Treating Provider/Extender: Grier Rocher, Dinah Weeks in Treatment: 0 Information Obtained from: Patient Chief Complaint 09/08/2022; left lower extremity wound 01/09/2023; left lower extremity areas of skin breakdown Electronic Signature(s) Signed: 01/09/2023 12:04:24 PM By: Kalman Shan Heather Entered By: Kalman Shan on 01/09/2023 09:01:07 -------------------------------------------------------------------------------- HPI Details Patient Name: Date of Service: [place], Heather [place] J. 01/09/2023 8:00 A M Medical Record Number: [id number] Patient Account Number: [id number] Date of Birth/Sex: Treating RN: 1918-01-10 (89 y.o. F) Primary Care Provider: Marlowe Sax Other Clinician: Referring Provider: Treating Provider/Extender: Grier Rocher, Dinah Weeks in Treatment: 0 History of Present Illness HPI Description: Admission 09/08/2022 [name] is a 89 year old female with a past medical history of venous insufficiency, chronic diastolic heart failure, dementia, and breast cancer that presents to the clinic for a 46-monthhistory of nonhealing ulcer to the left lower extremity. She states over the past 2 months the site has become more painful And is weeping more. She visited her primary care office on 10/17 and was prescribed doxycycline for cellulitis of the left lower extremity. It is unclear if this has helped her symptoms. She has home health and they have been using Medihoney under compression therapy. They come out twice weekly. Her ABIs  in office were 1.08. Currently she denies systemic signs of infection. 10/31; patient presents for follow-up. Patient grew high levels of Pseudomonas aeruginosa, Staphylococcus aureus and Enterococcus bacillus from pcr culture at last clinic visit. Keystone antibiotic ointment has been ordered for the patient, she has not yet received this. She has tolerated the wrap well. 11/14; patient presents for follow-up. She started using Keystone antibiotic spray over the past 1 to 2 weeks. She has reported significant improvement in wound healing. She denies any pain. She has a small wound remaining to the dorsal aspect of her foot. 11/21; patient presents for follow-up. At last clinic visit [facility] with silver alginate under Kerlix/Coban. She states she ordered her compression stockings and they are in the mail. Her wounds have healed. 01/09/2023 [name] a 89year old female with a past medical history of venous insufficiency, chronic diastolic heart failure, dementia and breast cancer that presents the clinic for a 2-week history of skin breakdown to the left lower extremity. She was seen in our clinic several months ago for this issue. She states that home health is coming out twice a week and wrapping her legs. She currently denies signs of infection. She does not want to wear compression stockings. Electronic Signature(s) Signed: 01/09/2023 12:04:24 PM By: HKalman ShanDO Entered By: HKalman Shanon 01/09/2023 09:02:47 -------------------------------------------------------------------------------- Physical Exam Details Patient Name: Date of Service: [name], Heather RRoystonJ. 01/09/2023 8:00 A M Medical Record Number: [id number] Account Number: [id number] of Birth/Sex: Treating RN: [birth year](89y.o. F) Primary Care Provider: NMarlowe SaxOther Clinician: Referring Provider: Treating Provider/Extender: HFannie, Cillo DCyndy [name]([id number] Page 2 of 6 Weeks in Treatment: 0 Constitutional respirations regular, non-labored and within target range for patient.. Cardiovascular 2+ dorsalis pedis/posterior tibialis pulses. Psychiatric pleasant and cooperative. Notes Extremity: Scattered areas of skin breakdown to the dorsal left  foot side and distal portion of the leg. Almost completely epithelialized. No weeping noted. No signs of surrounding infection. Electronic Signature(s) Signed: 01/09/2023 12:04:24 PM By: Kalman Shan Heather Entered By: Kalman Shan on 01/09/2023 09:05:07 -------------------------------------------------------------------------------- Physician Orders Details Patient Name: Date of Service: [place], Heather [place] J. 01/09/2023 8:00 A M Medical Record Number: [id number] Patient Account Number: [id number] Date of Birth/Sex: Treating RN: 10-02-1918 (89 y.o. Heather [name] Primary Care Provider: Marlowe Sax Other Clinician: Referring Provider: Treating Provider/Extender: Kalman Shan Ngetich, Dinah Weeks in Treatment: 0 Verbal / Phone Orders: No Diagnosis Coding Follow-up Appointments ppointment in 2 weeks. - w/ Dr. Heber [place] and Allayne Butcher Rm # 9 Friday 01/23/23 @ 11:00 Return A Bathing/ Shower/ Hygiene May shower with protection but Heather not get wound dressing(s) wet. Protect dressing(s) with water repellant cover (for example, large plastic bag) or a cast cover and may then take shower. Edema Control - Lymphedema / SCD / Other Elevate legs to the level of the heart or above for 30 minutes daily and/or when sitting for 3-4 times a day throughout the day. Avoid standing for long periods of time. Home Health Admit to [facility] for skilled nursing wound care. May utilize formulary equivalent dressing for wound treatment orders unless otherwise specified. - Keystone, Calcium Alginate Ag, kerlix and coban  wrap. Home health to change 2x a week Wound Treatment Wound #2 - Lower Leg Wound Laterality: Left, Circumferential Cleanser: Soap and Water (Home Health) 2 x Per Week/15 Days Discharge Instructions: May shower and wash wound with dial antibacterial soap and water prior to dressing change. Topical: Keystone (Home Health) 2 x Per Week/15 Days Prim Dressing: Sorbalgon AG Dressing, 4x4 (in/in) (Home Health) 2 x Per Week/15 Days ary Discharge Instructions: Apply to wound bed as instructed Secondary Dressing: ABD Pad, 5x9 (Home Health) 2 x Per Week/15 Days Discharge Instructions: Apply over primary dressing as directed. Compression Wrap: Kerlix Roll 4.5x3.1 (in/yd) (Home Health) 2 x Per Week/15 Days Discharge Instructions: Apply Kerlix and Coban compression as directed. Compression Wrap: Coban Self-Adherent Wrap 4x5 (in/yd) (Home Health) 2 x Per Week/15 Days Discharge Instructions: Apply over Kerlix as directed. Electronic Signature(s) Signed: 01/09/2023 12:04:24 PM By: Kalman Shan Heather Entered By: Kalman Shan on 01/09/2023 09:05:14 [place], Heather [name] ([id number]) [id number] Page 3 of 6 -------------------------------------------------------------------------------- Problem List Details Patient Name: Date of Service: HA Heather [name] 01/09/2023 8:00 A M Medical Record Number: [id number] Patient Account Number: [id number] Date of Birth/Sex: Treating RN: 1918-02-27 (89 y.o. F) Primary Care Provider: Marlowe Sax Other Clinician: Referring Provider: Treating Provider/Extender: Grier Rocher, Dinah Weeks in Treatment: 0 Active Problems ICD-10 Encounter Code Description Active Date MDM Diagnosis L97.821 Non-pressure chronic ulcer of other part of left lower leg limited to breakdown 01/09/2023 No Yes of skin I87.312 Chronic venous hypertension (idiopathic) with ulcer of left lower extremity 01/09/2023 No Yes [id number] Chronic diastolic  (congestive) heart failure 01/09/2023 No Yes I10 Essential (primary) hypertension 01/09/2023 No Yes Z85.3 Personal history of malignant neoplasm of breast 01/09/2023 No Yes Inactive Problems Resolved Problems Electronic Signature(s) Signed: 01/09/2023 12:04:24 PM By: Kalman Shan Heather Entered By: Kalman Shan on 01/09/2023 09:00:39 -------------------------------------------------------------------------------- Progress Note Details Patient Name: Date of Service: [place], Heather Heather J. 01/09/2023 8:00 A M Medical Record Number: [id number] Patient Account Number: [id number] Date of Birth/Sex: Treating RN: October 27, 1918 (89 y.o. F) Primary Care Provider: Marlowe Sax Other Clinician: Referring Provider: Treating Provider/Extender: Kalman Shan Ngetich, Dinah Weeks in Treatment: 0 Subjective Chief Complaint Information obtained from  Patient 09/08/2022; left lower extremity wound 01/09/2023; left lower extremity areas of skin breakdown History of Present Illness (HPI) Admission 09/08/2022 Mr. Laportia Damery is a 89 year old female with a past medical history of venous insufficiency, chronic diastolic heart failure, dementia, and breast cancer that presents to the clinic for a 66-monthhistory of nonhealing ulcer to the left lower extremity. She states over the past 2 months the site has become more painful And is weeping more. She visited her primary care office on 10/17 and was prescribed doxycycline for cellulitis of the left lower extremity. It is unclear if this has helped her symptoms. She has home health and they have been using Medihoney under compression therapy. They come out twice weekly. Her ABIs in office were 1.08. Currently she denies systemic signs of infection. 10/31; patient presents for follow-up. Patient grew high levels of Pseudomonas aeruginosa, Staphylococcus aureus and Enterococcus bacillus from pcr culture at last clinic visit. Keystone antibiotic ointment has  been ordered for the patient, she has not yet received this. She has tolerated the wrap well. Heather, [name]([id number] Page 4 of 6 11/14; patient presents for follow-up. She started using Keystone antibiotic spray over the past 1 to 2 weeks. She has reported significant improvement in wound healing. She denies any pain. She has a small wound remaining to the dorsal aspect of her foot. 11/21; patient presents for follow-up. At last [facility] visit [facility] silver alginate under Kerlix/Coban. She states she ordered her compression stockings and they are in the mail. Her wounds have healed. 01/09/2023 Ms. DConsuello Underhillis a 89year old female with a past medical history of venous insufficiency, chronic diastolic heart failure, dementia and breast cancer that presents the clinic for a 2-week history of skin breakdown to the left lower extremity. She was seen in our clinic several months ago for this issue. She states that home health is coming out twice a week and wrapping her legs. She currently denies signs of infection. She does not want to wear compression stockings. Patient History Allergies fentanyl (Reaction: N/V), naproxen (Reaction: hypersensitivity) Social History Never smoker, Marital Status - Widowed, Alcohol Use - Never, Drug Use - No History, Caffeine Use - Never. Medical History Cardiovascular Patient has history of Hypertension, Peripheral Venous Disease Hospitalization/Surgery History - left mastectomy. Medical A Surgical History Notes nd Gastrointestinal laryngopharyngeal reflux disease diverticulosis Musculoskeletal osteoporosis carpel tunnel cervical spondylosis DDD Objective Constitutional respirations regular, non-labored and within target range for patient.. Vitals Time Taken: 8:05 AM, Respiratory Rate: 17 breaths/min. Cardiovascular 2+ dorsalis pedis/posterior tibialis  pulses. Psychiatric pleasant and cooperative. General Notes: Extremity: Scattered areas of skin breakdown to the dorsal left foot side and distal portion of the leg. Almost completely epithelialized. No weeping noted. No signs of surrounding infection. Integumentary (Hair, Skin) Wound #2 status is Open. Original cause of wound was Gradually Appeared. The date acquired was: 01/09/2023. The wound is located on the Left,Circumferential Lower Leg. The wound measures 17cm length x 24cm width x 0.1cm depth; 320.442cm^2 area and 32.044cm^3 volume. There is no tunneling or undermining noted. There is a medium amount of serosanguineous drainage noted. The wound margin is distinct with the outline attached to the wound base. There is large (67-100%) red, pink granulation within the wound bed. The periwound skin appearance exhibited: Hemosiderin Staining. The periwound skin appearance did not exhibit: Callus, Crepitus, Excoriation, Induration, Rash, Scarring, Dry/Scaly, Maceration, Atrophie Blanche, Cyanosis, Ecchymosis, Mottled, Pallor, Rubor, Erythema. Periwound temperature was noted as  No Abnormality. The periwound has tenderness on palpation. Assessment Active Problems ICD-10 Non-pressure chronic ulcer of other part of left lower leg limited to breakdown of skin Chronic venous hypertension (idiopathic) with ulcer of left lower extremity Chronic diastolic (congestive) heart failure Essential (primary) hypertension Personal history of malignant neoplasm of breast Patient presents with a 2-week history of skin breakdown to her left lower extremity secondary to venous insufficiency. She does not like to wear compression Heather, [name] ([id number]) [phone].pdf Page 5 of 6 stockings. She has been wrapped by home health twice weekly. I seen her in the past for this issue. She has done well with [facility] antibiotic ointment and silver alginate under Kerlix/Coban and we will  restart this today. Follow-up in 2 weeks. Plan Follow-up Appointments: Return Appointment in 2 weeks. - w/ Dr. Heber  and Allayne Butcher Rm # 9 Friday 01/23/23 @ 11:00 Bathing/ Shower/ Hygiene: May shower with protection but Heather not get wound dressing(s) wet. Protect dressing(s) with water repellant cover (for example, large plastic bag) or a cast cover and may then take shower. Edema Control - Lymphedema / SCD / Other: Elevate legs to the level of the heart or above for 30 minutes daily and/or when sitting for 3-4 times a day throughout the day. Avoid standing for long periods of time. Home Health: Admit to Home Health for skilled nursing wound care. May utilize formulary equivalent dressing for wound treatment orders unless otherwise specified. - Keystone, Calcium Alginate Ag, kerlix and coban wrap. Home health to change 2x a week WOUND #2: - Lower Leg Wound Laterality: Left, Circumferential Cleanser: Soap and Water (Home Health) 2 x Per Week/15 Days Discharge Instructions: May shower and wash wound with dial antibacterial soap and water prior to dressing change. Topical: Keystone (Home Health) 2 x Per Week/15 Days Prim Dressing: Sorbalgon AG Dressing, 4x4 (in/in) (Home Health) 2 x Per Week/15 Days ary Discharge Instructions: Apply to wound bed as instructed Secondary Dressing: ABD Pad, 5x9 (Home Health) 2 x Per Week/15 Days Discharge Instructions: Apply over primary dressing as directed. Com pression Wrap: Kerlix Roll 4.5x3.1 (in/yd) (Home Health) 2 x Per Week/15 Days Discharge Instructions: Apply Kerlix and Coban compression as directed. Com pression Wrap: Coban Self-Adherent Wrap 4x5 (in/yd) (Home Health) 2 x Per Week/15 Days Discharge Instructions: Apply over Kerlix as directed. 1. Keystone antibiotic ointment with silver alginate under Kerlix/Cobanooleft lower extremity 2. Follow-up in 2 weeksoopatient has home health that comes out twice weekly to change the wraps Electronic  Signature(s) Signed: 01/09/2023 12:04:24 PM By: Kalman Shan Heather Entered By: Kalman Shan on 01/09/2023 09:06:54 -------------------------------------------------------------------------------- HxROS Details Patient Name: Date of Service: [place], Heather Heather J. 01/09/2023 8:00 A M Medical Record Number: [id number] Patient Account Number: [id number] Date of Birth/Sex: Treating RN: [birth year] (89 y.o. Heather [name] Primary Care Provider: Marlowe Sax Other Clinician: Referring Provider: Treating Provider/Extender: Kalman Shan Ngetich, Dinah Weeks in Treatment: 0 Cardiovascular Medical History: Positive for: Hypertension; Peripheral Venous Disease Gastrointestinal Medical History: Past Medical History Notes: laryngopharyngeal reflux disease diverticulosis Musculoskeletal Medical History: Past Medical History Notes: osteoporosis carpel tunnel cervical spondylosis DDD Immunizations Pneumococcal Vaccine: Received Pneumococcal Vaccination: Yes Received Pneumococcal Vaccination On or After 60th Birthday: Yes Implantable [address] RAEVYN, FENZEL ([id number]) [id number] Page 6 of 6 No devices added Hospitalization / Surgery History Type of Hospitalization/Surgery left mastectomy Family and Social History Never smoker; Marital Status - Widowed; Alcohol Use: Never; Drug Use: No History; Caffeine Use: Never; Financial Concerns: No; Food, Clothing or Shelter Needs: No; Support  System Lacking: No Electronic Signature(s) Signed: 01/09/2023 12:04:24 PM By: Kalman Shan Heather Signed: 01/12/2023 3:50:45 PM By: Rhae Hammock RN Entered By: Rhae Hammock on 01/08/2023 14:45:47 -------------------------------------------------------------------------------- SuperBill Details Patient Name: Date of Service: [place], Heather [place] J. 01/09/2023 Medical Record Number: [id number] Patient Account Number: [id number] Date of Birth/Sex: Treating  RN: 1918-01-11 (89 y.o. F) Primary Care Provider: Marlowe Sax Other Clinician: Referring Provider: Treating Provider/Extender: Grier Rocher, Dinah Weeks in Treatment: 0 Diagnosis Coding ICD-10 Codes Code Description [phone] Non-pressure chronic ulcer of other part of left lower leg limited to breakdown of skin I87.312 Chronic venous hypertension (idiopathic) with ulcer of left lower extremity [id number] Chronic diastolic (congestive) heart failure I10 Essential (primary) hypertension Z85.3 Personal history of malignant neoplasm of breast Facility Procedures : CPT4 Code: [id number] Description: 99213 - WOUND CARE VISIT-LEV 3 EST PT Modifier: Quantity: 1 Physician Procedures : CPT4 Code Description Modifier [id number] - WC PHYS LEVEL 3 - EST PT ICD-10 Diagnosis Description L97.821 Non-pressure chronic ulcer of other part of left lower leg limited to breakdown of skin I87.312 Chronic venous hypertension (idiopathic) with  ulcer of left lower extremity 99991111 Chronic diastolic (congestive) heart failure I10 Essential (primary) hypertension Quantity: 1 Electronic Signature(s) Signed: 01/09/2023 12:04:24 PM By: Kalman Shan Heather Signed: 01/12/2023 3:50:45 PM By: Rhae Hammock RN Entered By: Rhae Hammock on 01/09/2023 09:12:42

## 2023-01-14 ENCOUNTER — Telehealth: Payer: Self-pay | Admitting: Family

## 2023-01-14 DIAGNOSIS — G894 Chronic pain syndrome: Secondary | ICD-10-CM | POA: Diagnosis not present

## 2023-01-14 DIAGNOSIS — J309 Allergic rhinitis, unspecified: Secondary | ICD-10-CM | POA: Diagnosis not present

## 2023-01-14 DIAGNOSIS — K5900 Constipation, unspecified: Secondary | ICD-10-CM | POA: Diagnosis not present

## 2023-01-14 DIAGNOSIS — M47812 Spondylosis without myelopathy or radiculopathy, cervical region: Secondary | ICD-10-CM | POA: Diagnosis not present

## 2023-01-14 DIAGNOSIS — G301 Alzheimer's disease with late onset: Secondary | ICD-10-CM | POA: Diagnosis not present

## 2023-01-14 DIAGNOSIS — F411 Generalized anxiety disorder: Secondary | ICD-10-CM | POA: Diagnosis not present

## 2023-01-14 DIAGNOSIS — L602 Onychogryphosis: Secondary | ICD-10-CM

## 2023-01-14 DIAGNOSIS — I110 Hypertensive heart disease with heart failure: Secondary | ICD-10-CM | POA: Diagnosis not present

## 2023-01-14 DIAGNOSIS — I4510 Unspecified right bundle-branch block: Secondary | ICD-10-CM | POA: Diagnosis not present

## 2023-01-14 DIAGNOSIS — L97821 Non-pressure chronic ulcer of other part of left lower leg limited to breakdown of skin: Secondary | ICD-10-CM | POA: Diagnosis not present

## 2023-01-14 DIAGNOSIS — F0284 Dementia in other diseases classified elsewhere, unspecified severity, with anxiety: Secondary | ICD-10-CM | POA: Diagnosis not present

## 2023-01-14 DIAGNOSIS — K449 Diaphragmatic hernia without obstruction or gangrene: Secondary | ICD-10-CM | POA: Diagnosis not present

## 2023-01-14 DIAGNOSIS — M810 Age-related osteoporosis without current pathological fracture: Secondary | ICD-10-CM | POA: Diagnosis not present

## 2023-01-14 DIAGNOSIS — K5730 Diverticulosis of large intestine without perforation or abscess without bleeding: Secondary | ICD-10-CM | POA: Diagnosis not present

## 2023-01-14 DIAGNOSIS — I872 Venous insufficiency (chronic) (peripheral): Secondary | ICD-10-CM | POA: Diagnosis not present

## 2023-01-14 DIAGNOSIS — I5032 Chronic diastolic (congestive) heart failure: Secondary | ICD-10-CM | POA: Diagnosis not present

## 2023-01-14 DIAGNOSIS — K219 Gastro-esophageal reflux disease without esophagitis: Secondary | ICD-10-CM | POA: Diagnosis not present

## 2023-01-14 NOTE — Telephone Encounter (Signed)
Continue to monitor if complains of any pain schedule for evaluation or send to ED.

## 2023-01-14 NOTE — Telephone Encounter (Signed)
Nurse Judson Roch with [facility] called to say that Ms. Weisheit fell this morning and hit her bottom. Patient denies head injury. No evidence of any other injury. Judson Roch just wanted [place] to know.

## 2023-01-16 DIAGNOSIS — G894 Chronic pain syndrome: Secondary | ICD-10-CM | POA: Diagnosis not present

## 2023-01-16 DIAGNOSIS — G301 Alzheimer's disease with late onset: Secondary | ICD-10-CM | POA: Diagnosis not present

## 2023-01-16 DIAGNOSIS — I110 Hypertensive heart disease with heart failure: Secondary | ICD-10-CM | POA: Diagnosis not present

## 2023-01-16 DIAGNOSIS — K219 Gastro-esophageal reflux disease without esophagitis: Secondary | ICD-10-CM | POA: Diagnosis not present

## 2023-01-16 DIAGNOSIS — F0284 Dementia in other diseases classified elsewhere, unspecified severity, with anxiety: Secondary | ICD-10-CM | POA: Diagnosis not present

## 2023-01-16 DIAGNOSIS — K449 Diaphragmatic hernia without obstruction or gangrene: Secondary | ICD-10-CM | POA: Diagnosis not present

## 2023-01-16 DIAGNOSIS — M47812 Spondylosis without myelopathy or radiculopathy, cervical region: Secondary | ICD-10-CM | POA: Diagnosis not present

## 2023-01-16 DIAGNOSIS — I872 Venous insufficiency (chronic) (peripheral): Secondary | ICD-10-CM | POA: Diagnosis not present

## 2023-01-16 DIAGNOSIS — M810 Age-related osteoporosis without current pathological fracture: Secondary | ICD-10-CM | POA: Diagnosis not present

## 2023-01-16 DIAGNOSIS — K5900 Constipation, unspecified: Secondary | ICD-10-CM | POA: Diagnosis not present

## 2023-01-16 DIAGNOSIS — I5032 Chronic diastolic (congestive) heart failure: Secondary | ICD-10-CM | POA: Diagnosis not present

## 2023-01-16 DIAGNOSIS — J309 Allergic rhinitis, unspecified: Secondary | ICD-10-CM | POA: Diagnosis not present

## 2023-01-16 DIAGNOSIS — I4510 Unspecified right bundle-branch block: Secondary | ICD-10-CM | POA: Diagnosis not present

## 2023-01-16 DIAGNOSIS — K5730 Diverticulosis of large intestine without perforation or abscess without bleeding: Secondary | ICD-10-CM | POA: Diagnosis not present

## 2023-01-16 DIAGNOSIS — F411 Generalized anxiety disorder: Secondary | ICD-10-CM | POA: Diagnosis not present

## 2023-01-16 DIAGNOSIS — L97821 Non-pressure chronic ulcer of other part of left lower leg limited to breakdown of skin: Secondary | ICD-10-CM | POA: Diagnosis not present

## 2023-01-17 ENCOUNTER — Other Ambulatory Visit: Payer: Self-pay | Admitting: Family

## 2023-01-19 ENCOUNTER — Other Ambulatory Visit: Payer: Self-pay | Admitting: *Deleted

## 2023-01-19 ENCOUNTER — Other Ambulatory Visit: Payer: Self-pay

## 2023-01-19 DIAGNOSIS — G894 Chronic pain syndrome: Secondary | ICD-10-CM | POA: Diagnosis not present

## 2023-01-19 DIAGNOSIS — M47812 Spondylosis without myelopathy or radiculopathy, cervical region: Secondary | ICD-10-CM | POA: Diagnosis not present

## 2023-01-19 DIAGNOSIS — K5730 Diverticulosis of large intestine without perforation or abscess without bleeding: Secondary | ICD-10-CM | POA: Diagnosis not present

## 2023-01-19 DIAGNOSIS — K5900 Constipation, unspecified: Secondary | ICD-10-CM | POA: Diagnosis not present

## 2023-01-19 DIAGNOSIS — F411 Generalized anxiety disorder: Secondary | ICD-10-CM | POA: Diagnosis not present

## 2023-01-19 DIAGNOSIS — I872 Venous insufficiency (chronic) (peripheral): Secondary | ICD-10-CM | POA: Diagnosis not present

## 2023-01-19 DIAGNOSIS — J309 Allergic rhinitis, unspecified: Secondary | ICD-10-CM | POA: Diagnosis not present

## 2023-01-19 DIAGNOSIS — G301 Alzheimer's disease with late onset: Secondary | ICD-10-CM | POA: Diagnosis not present

## 2023-01-19 DIAGNOSIS — K219 Gastro-esophageal reflux disease without esophagitis: Secondary | ICD-10-CM | POA: Diagnosis not present

## 2023-01-19 DIAGNOSIS — I4510 Unspecified right bundle-branch block: Secondary | ICD-10-CM | POA: Diagnosis not present

## 2023-01-19 DIAGNOSIS — K449 Diaphragmatic hernia without obstruction or gangrene: Secondary | ICD-10-CM | POA: Diagnosis not present

## 2023-01-19 DIAGNOSIS — M810 Age-related osteoporosis without current pathological fracture: Secondary | ICD-10-CM | POA: Diagnosis not present

## 2023-01-19 DIAGNOSIS — L97821 Non-pressure chronic ulcer of other part of left lower leg limited to breakdown of skin: Secondary | ICD-10-CM | POA: Diagnosis not present

## 2023-01-19 DIAGNOSIS — I5032 Chronic diastolic (congestive) heart failure: Secondary | ICD-10-CM | POA: Diagnosis not present

## 2023-01-19 DIAGNOSIS — I110 Hypertensive heart disease with heart failure: Secondary | ICD-10-CM | POA: Diagnosis not present

## 2023-01-19 DIAGNOSIS — F0284 Dementia in other diseases classified elsewhere, unspecified severity, with anxiety: Secondary | ICD-10-CM | POA: Diagnosis not present

## 2023-01-19 MED ORDER — FAMOTIDINE 20 MG PO TABS: 20.0000 mg | ORAL_TABLET | Freq: Every day | ORAL | 5 refills | Status: DC

## 2023-01-19 MED ORDER — FUROSEMIDE 20 MG PO TABS: 10.0000 mg | ORAL_TABLET | Freq: Every day | ORAL | 1 refills | Status: DC

## 2023-01-19 NOTE — Telephone Encounter (Signed)
Patient has request refill on medication Spironolactone '25mg'$ . I tried to refill medication and High Risk Warning showed. Medication pend and sent to PCP Ngetich, Nelda Bucks, NP for approval.

## 2023-01-19 NOTE — Telephone Encounter (Signed)
Spoke with patients son and he stated that patient is fine. Patient had slight bruising on her jaw that was of no concern.   Heather [name] would like a referral to the foot specialist for his mother to have her toe nails clipped. Referral order is pending

## 2023-01-19 NOTE — Telephone Encounter (Signed)
[facility] requested refill

## 2023-01-19 NOTE — Telephone Encounter (Signed)
Podiatry referral ordered

## 2023-01-21 ENCOUNTER — Ambulatory Visit: Payer: Medicare Other | Admitting: Podiatry

## 2023-01-21 DIAGNOSIS — I872 Venous insufficiency (chronic) (peripheral): Secondary | ICD-10-CM | POA: Diagnosis not present

## 2023-01-21 DIAGNOSIS — I4510 Unspecified right bundle-branch block: Secondary | ICD-10-CM | POA: Diagnosis not present

## 2023-01-21 DIAGNOSIS — J309 Allergic rhinitis, unspecified: Secondary | ICD-10-CM | POA: Diagnosis not present

## 2023-01-21 DIAGNOSIS — F411 Generalized anxiety disorder: Secondary | ICD-10-CM | POA: Diagnosis not present

## 2023-01-21 DIAGNOSIS — L97821 Non-pressure chronic ulcer of other part of left lower leg limited to breakdown of skin: Secondary | ICD-10-CM | POA: Diagnosis not present

## 2023-01-21 DIAGNOSIS — G894 Chronic pain syndrome: Secondary | ICD-10-CM | POA: Diagnosis not present

## 2023-01-21 DIAGNOSIS — K219 Gastro-esophageal reflux disease without esophagitis: Secondary | ICD-10-CM | POA: Diagnosis not present

## 2023-01-21 DIAGNOSIS — M810 Age-related osteoporosis without current pathological fracture: Secondary | ICD-10-CM | POA: Diagnosis not present

## 2023-01-21 DIAGNOSIS — M47812 Spondylosis without myelopathy or radiculopathy, cervical region: Secondary | ICD-10-CM | POA: Diagnosis not present

## 2023-01-21 DIAGNOSIS — K5730 Diverticulosis of large intestine without perforation or abscess without bleeding: Secondary | ICD-10-CM | POA: Diagnosis not present

## 2023-01-21 DIAGNOSIS — I110 Hypertensive heart disease with heart failure: Secondary | ICD-10-CM | POA: Diagnosis not present

## 2023-01-21 DIAGNOSIS — K5900 Constipation, unspecified: Secondary | ICD-10-CM | POA: Diagnosis not present

## 2023-01-21 DIAGNOSIS — F0284 Dementia in other diseases classified elsewhere, unspecified severity, with anxiety: Secondary | ICD-10-CM | POA: Diagnosis not present

## 2023-01-21 DIAGNOSIS — G301 Alzheimer's disease with late onset: Secondary | ICD-10-CM | POA: Diagnosis not present

## 2023-01-21 DIAGNOSIS — I5032 Chronic diastolic (congestive) heart failure: Secondary | ICD-10-CM | POA: Diagnosis not present

## 2023-01-21 DIAGNOSIS — K449 Diaphragmatic hernia without obstruction or gangrene: Secondary | ICD-10-CM | POA: Diagnosis not present

## 2023-01-23 ENCOUNTER — Encounter (HOSPITAL_BASED_OUTPATIENT_CLINIC_OR_DEPARTMENT_OTHER): Payer: Medicare Other | Attending: Internal Medicine | Admitting: Internal Medicine

## 2023-01-23 ENCOUNTER — Other Ambulatory Visit: Payer: Self-pay

## 2023-01-23 DIAGNOSIS — Z853 Personal history of malignant neoplasm of breast: Secondary | ICD-10-CM | POA: Diagnosis not present

## 2023-01-23 DIAGNOSIS — F0390 Unspecified dementia without behavioral disturbance: Secondary | ICD-10-CM | POA: Diagnosis not present

## 2023-01-23 DIAGNOSIS — L97821 Non-pressure chronic ulcer of other part of left lower leg limited to breakdown of skin: Secondary | ICD-10-CM | POA: Insufficient documentation

## 2023-01-23 DIAGNOSIS — I87312 Chronic venous hypertension (idiopathic) with ulcer of left lower extremity: Secondary | ICD-10-CM | POA: Insufficient documentation

## 2023-01-23 DIAGNOSIS — I110 Hypertensive heart disease with heart failure: Secondary | ICD-10-CM | POA: Diagnosis not present

## 2023-01-23 DIAGNOSIS — I5032 Chronic diastolic (congestive) heart failure: Secondary | ICD-10-CM | POA: Insufficient documentation

## 2023-01-23 DIAGNOSIS — I10 Essential (primary) hypertension: Secondary | ICD-10-CM

## 2023-01-23 MED ORDER — CARVEDILOL 3.125 MG PO TABS: 3.1250 mg | ORAL_TABLET | Freq: Two times a day (BID) | ORAL | 1 refills | Status: DC

## 2023-01-24 NOTE — Progress Notes (Signed)
Heather, STEPIEN ([id number]) [id number] Page 1 of 6 Visit Report for 01/23/2023 Chief Complaint Document Details Patient Name: Date of Service: Heather [name] 01/23/2023 11:00 A M Medical Record Number: [id number] Patient Account Number: [id number] Date of Birth/Sex: Treating RN: 08-25-1918 (89 y.o. F) Primary Care Provider: Marlowe Sax Other Clinician: Referring Provider: Treating Provider/Extender: Grier Rocher, Dinah Weeks in Treatment: 2 Information Obtained from: Patient Chief Complaint 09/08/2022; left lower extremity wound 01/09/2023; left lower extremity areas of skin breakdown Electronic Signature(s) Signed: 01/23/2023 12:19:22 PM By: Kalman Shan DO Entered By: Kalman Shan on 01/23/2023 11:50:32 -------------------------------------------------------------------------------- HPI Details Patient Name: Date of Service: [place], DO Calvin J. 01/23/2023 11:00 A M Medical Record Number: [id number] Patient Account Number: [id number] Date of Birth/Sex: Treating RN: 08-24-1918 (89 y.o. F) Primary Care Provider: Marlowe Sax Other Clinician: Referring Provider: Treating Provider/Extender: Grier Rocher, Dinah Weeks in Treatment: 2 History of Present Illness HPI Description: Admission 09/08/2022 Heather [name] is a 89 year old female with a past medical history of venous insufficiency, chronic diastolic heart failure, dementia, and breast cancer that presents to the clinic for a 39-monthhistory of nonhealing ulcer to the left lower extremity. She states over the past 2 months the site has become more painful And is weeping more. She visited her primary care office on 10/17 and was prescribed doxycycline for cellulitis of the left lower extremity. It is unclear if this has helped her symptoms. She has home health and they have been using Medihoney under compression therapy. They come out twice weekly. Her ABIs  in office were 1.08. Currently she denies systemic signs of infection. 10/31; patient presents for follow-up. Patient grew high levels of Pseudomonas aeruginosa, Staphylococcus aureus and Enterococcus bacillus from pcr culture at last clinic visit. Keystone antibiotic ointment has been ordered for the patient, she has not yet received this. She has tolerated the wrap well. 11/14; patient presents for follow-up. She started using Keystone antibiotic spray over the past 1 to 2 weeks. She has reported significant improvement in wound healing. She denies any pain. She has a small wound remaining to the dorsal aspect of her foot. 11/21; patient presents for follow-up. At last clinic visit [facility] with silver alginate under Kerlix/Coban. She states she ordered her compression stockings and they are in the mail. Her wounds have healed. 01/09/2023 Heather [name] a 89year old female with a past medical history of venous insufficiency, chronic diastolic heart failure, dementia and breast cancer that presents the clinic for a 2-week history of skin breakdown to the left lower extremity. She was seen in our clinic several months ago for this issue. She states that home health is coming out twice a week and wrapping her legs. She currently denies signs of infection. She does not want to wear compression stockings. 3/8; patient presents for follow-up. She has been using Keystone antibiotic spray with silver alginate under Kerlix/Coban. She states that home health has been coming out. She has no issues or complaints today. Electronic Signature(s) Signed: 01/23/2023 12:19:22 PM By: HKalman ShanDO Entered By: HKalman Shanon 01/23/2023 11:51:26 -------------------------------------------------------------------------------- Physical Exam Details Patient Name: Date of Service: [place] 01/23/2023 11:00 A M Medical Record Number: [id number]  Account Number: [id number] of Birth/Sex: Treating RN: [birth year](89y.o. FKharizma [name] DCyndy Freeze([id number] Page 2 of 6 Primary Care Provider: NMarlowe SaxOther Clinician: Referring Provider: Treating Provider/Extender: HKalman ShanNgetich, Dinah Weeks in  Treatment: 2 Constitutional respirations regular, non-labored and within target range for patient.. Cardiovascular 2+ dorsalis pedis/posterior tibialis pulses. Psychiatric pleasant and cooperative. Notes Left lower Extremity: Scattered areas of skin breakdown to the dorsal left foot side and distal portion of the leg. No signs of surrounding infection. Electronic Signature(s) Signed: 01/23/2023 12:19:22 PM By: Kalman Shan DO Entered By: Kalman Shan on 01/23/2023 11:52:20 -------------------------------------------------------------------------------- Physician Orders Details Patient Name: Date of Service: [employer], DO [place] 01/23/2023 11:00 A M Medical Record Number: [id number] Patient Account Number: [id number] Date of Birth/Sex: Treating RN: 08/17/1918 (89 y.o. Heather [name] Primary Care Provider: Marlowe Sax Other Clinician: Referring Provider: Treating Provider/Extender: Kalman Shan Ngetich, Dinah Weeks in Treatment: 2 Verbal / Phone Orders: No Diagnosis Coding Follow-up Appointments ppointment in 1 week. - w/ Dr. Heber Okarche and bobbi rm # 4 Friday 01/30/23 @ 1:30 Return A Other: - Get some antifungal spray (over-the-counter) to use before the wrap goes on at each visit Bathing/ Shower/ Hygiene May shower with protection but do not get wound dressing(s) wet. Protect dressing(s) with water repellant cover (for example, large plastic bag) or a cast cover and may then take shower. Edema Control - Lymphedema / SCD / Other Elevate legs to the level of the heart or above for 30 minutes daily and/or when sitting for 3-4 times a day throughout the  day. Avoid standing for long periods of time. Home Health Admit to [facility] for skilled nursing wound care. May utilize formulary equivalent dressing for wound treatment orders unless otherwise specified. - Antifungal spray to toes, Keystone, Calcium Alginate Ag, kerlix and coban wrap. Home health to change 2x a week Wound Treatment Wound #2 - Lower Leg Wound Laterality: Left, Circumferential Cleanser: Soap and Water (Home Health) 2 x Per Week/15 Days Discharge Instructions: May shower and wash wound with dial antibacterial soap and water prior to dressing change. Topical: Keystone (Home Health) 2 x Per Week/15 Days Prim Dressing: Sorbalgon AG Dressing, 4x4 (in/in) (Home Health) 2 x Per Week/15 Days ary Discharge Instructions: Apply to wound bed as instructed Secondary Dressing: ABD Pad, 5x9 (Home Health) 2 x Per Week/15 Days Discharge Instructions: Apply over primary dressing as directed. Compression Wrap: Kerlix Roll 4.5x3.1 (in/yd) (Home Health) 2 x Per Week/15 Days Discharge Instructions: Apply Kerlix and Coban compression as directed. Compression Wrap: Coban Self-Adherent Wrap 4x5 (in/yd) (Home Health) 2 x Per Week/15 Days Discharge Instructions: Apply over Kerlix as directed. Electronic Signature(s) Signed: 01/23/2023 12:19:22 PM By: Philis Pique, [name] ([id number] Page 3 of 6 Entered By: Kalman Shan on 01/23/2023 11:52:26 -------------------------------------------------------------------------------- Problem List Details Patient Name: Date of Service: Heather [name] 01/23/2023 11:00 A M Medical Record Number: [id number] Patient Account Number: [id number] Date of Birth/Sex: Treating RN: [birth year] (89 y.o. F) Primary Care Provider: Marlowe Sax Other Clinician: Referring Provider: Treating Provider/Extender: Grier Rocher, Dinah Weeks in Treatment: 2 Active Problems ICD-10 Encounter Code  Description Active Date MDM Diagnosis L97.821 Non-pressure chronic ulcer of other part of left lower leg limited to breakdown 01/09/2023 No Yes of skin I87.312 Chronic venous hypertension (idiopathic) with ulcer of left lower extremity 01/09/2023 No Yes [id number] Chronic diastolic (congestive) heart failure 01/09/2023 No Yes I10 Essential (primary) hypertension 01/09/2023 No Yes Z85.3 Personal history of malignant neoplasm of breast 01/09/2023 No Yes Inactive Problems Resolved Problems Electronic Signature(s) Signed: 01/23/2023 12:19:22 PM By: Kalman Shan DO Entered By: Kalman Shan on 01/23/2023 11:50:22 -------------------------------------------------------------------------------- Progress Note Details Patient Name: Date of Service: Heather  [name], DO RETHEA J. 01/23/2023 11:00 A M Medical Record Number: [id number] Patient Account Number: [id number] Date of Birth/Sex: Treating RN: Dec 18, 1917 (89 y.o. F) Primary Care Provider: Marlowe Sax Other Clinician: Referring Provider: Treating Provider/Extender: Grier Rocher, Dinah Weeks in Treatment: 2 Subjective Chief Complaint Information obtained from Patient 09/08/2022; left lower extremity wound 01/09/2023; left lower extremity areas of skin breakdown History of Present Illness (HPI) Admission 09/08/2022 Mr. Reika Prose is a 89 year old female with a past medical history of venous insufficiency, chronic diastolic heart failure, dementia, and breast cancer that presents to the clinic for a 102-monthhistory of nonhealing ulcer to the left lower extremity. She states over the past 2 months the site has become more painful And is weeping more. She visited her primary care office on 10/17 and was prescribed doxycycline for cellulitis of the left lower extremity. It is unclear if this has helped her symptoms. She has home health and they have been using Medihoney under compression therapy. They come out twice weekly. Her ABIs in  office were 1.08. Currently she denies systemic signs of infection. HNAINA, APEL([id number] Page 4 of 6 10/31; patient presents for follow-up. Patient grew high levels of Pseudomonas aeruginosa, Staphylococcus aureus and Enterococcus bacillus from pcr culture at last clinic visit. Keystone antibiotic ointment has been ordered for the patient, she has not yet received this. She has tolerated the wrap well. 11/14; patient presents for follow-up. She started using Keystone antibiotic spray over the past 1 to 2 weeks. She has reported significant improvement in wound healing. She denies any pain. She has a small wound remaining to the dorsal aspect of her foot. 11/21; patient presents for follow-up. At last clinic visit [facility] silver alginate under Kerlix/Coban. She states she ordered her compression stockings and they are in the mail. Her wounds have healed. 01/09/2023 Ms. DChamia Wiesneris a 89year old female with a past medical history of venous insufficiency, chronic diastolic heart failure, dementia and breast cancer that presents the clinic for a 2-week history of skin breakdown to the left lower extremity. She was seen in our clinic several months ago for this issue. She states that home health is coming out twice a week and wrapping her legs. She currently denies signs of infection. She does not want to wear compression stockings. 3/8; patient presents for follow-up. She has been using Keystone antibiotic spray with silver alginate under Kerlix/Coban. She states that home health has been coming out. She has no issues or complaints today. Patient History Social History Never smoker, Marital Status - Widowed, Alcohol Use - Never, Drug Use - No History, Caffeine Use - Never. Medical History Cardiovascular Patient has history of Hypertension, Peripheral Venous Disease Hospitalization/Surgery History -  left mastectomy. Medical A Surgical History Notes nd Gastrointestinal laryngopharyngeal reflux disease diverticulosis Musculoskeletal osteoporosis carpel tunnel cervical spondylosis DDD Objective Constitutional respirations regular, non-labored and within target range for patient.. Vitals Time Taken: 11:22 AM, Temperature: 97.9 F, Pulse: 67 bpm, Respiratory Rate: 20 breaths/min, Blood Pressure: 117/61 mmHg. Cardiovascular 2+ dorsalis pedis/posterior tibialis pulses. Psychiatric pleasant and cooperative. General Notes: Left lower Extremity: Scattered areas of skin breakdown to the dorsal left foot side and distal portion of the leg. No signs of surrounding infection. Integumentary (Hair, Skin) Wound #2 status is Open. Original cause of wound was Gradually Appeared. The date acquired was: 01/09/2023. The wound has been in treatment 2 weeks. The wound is located on the Left,Circumferential Lower Leg. The  wound measures 17.1cm length x 24.2cm width x 0.1cm depth; 325.013cm^2 area and 32.501cm^3 volume. There is a medium amount of serosanguineous drainage noted. The wound margin is distinct with the outline attached to the wound base. There is large (67-100%) red, pink granulation within the wound bed. The periwound skin appearance exhibited: Hemosiderin Staining. The periwound skin appearance did not exhibit: Callus, Crepitus, Excoriation, Induration, Rash, Scarring, Dry/Scaly, Maceration, Atrophie Blanche, Cyanosis, Ecchymosis, Mottled, Pallor, Rubor, Erythema. Periwound temperature was noted as No Abnormality. The periwound has tenderness on palpation. Wound #2 status is Open. Original cause of wound was Gradually Appeared. The date acquired was: 01/09/2023. The wound has been in treatment 2 weeks. The wound is located on the Left,Circumferential Lower Leg. The wound measures 17.1cm length x 24.2cm width x 0.1cm depth; 325.013cm^2 area and 32.501cm^3 volume. There is a medium amount of  serosanguineous drainage noted. The wound margin is distinct with the outline attached to the wound base. There is large (67-100%) red, pink granulation within the wound bed. The periwound skin appearance exhibited: Hemosiderin Staining. The periwound skin appearance did not exhibit: Callus, Crepitus, Excoriation, Induration, Rash, Scarring, Dry/Scaly, Maceration, Atrophie Blanche, Cyanosis, Ecchymosis, Mottled, Pallor, Rubor, Erythema. Periwound temperature was noted as No Abnormality. The periwound has tenderness on palpation. Assessment Active Problems ICD-10 Non-pressure chronic ulcer of other part of left lower leg limited to breakdown of skin Chronic venous hypertension (idiopathic) with ulcer of left lower extremity Chronic diastolic (congestive) heart failure Essential (primary) hypertension Heather [name], Heather [name] ([id number]) [id number] Page 5 of 6 Personal history of malignant neoplasm of breast Patient has more skin breakdown to the left lower extremity. I recommended adding antifungal's spray along with Keystone antibiotic spray and silver alginate under Kerlix/Coban. Follow-up in 1 week. Plan Follow-up Appointments: Return Appointment in 1 week. - w/ Dr. Heber Hewitt and bobbi rm # 4 Friday 01/30/23 @ 1:30 Other: - Get some antifungal spray (over-the-counter) to use before the wrap goes on at each visit Bathing/ Shower/ Hygiene: May shower with protection but do not get wound dressing(s) wet. Protect dressing(s) with water repellant cover (for example, large plastic bag) or a cast cover and may then take shower. Edema Control - Lymphedema / SCD / Other: Elevate legs to the level of the heart or above for 30 minutes daily and/or when sitting for 3-4 times a day throughout the day. Avoid standing for long periods of time. Home Health: Admit to Home Health for skilled nursing wound care. May utilize formulary equivalent dressing for wound treatment orders unless  otherwise specified. - Antifungal spray to toes, Keystone, Calcium Alginate Ag, kerlix and coban wrap. Home health to change 2x a week WOUND #2: - Lower Leg Wound Laterality: Left, Circumferential Cleanser: Soap and Water (Home Health) 2 x Per Week/15 Days Discharge Instructions: May shower and wash wound with dial antibacterial soap and water prior to dressing change. Topical: Keystone (Home Health) 2 x Per Week/15 Days Prim Dressing: Sorbalgon AG Dressing, 4x4 (in/in) (Home Health) 2 x Per Week/15 Days ary Discharge Instructions: Apply to wound bed as instructed Secondary Dressing: ABD Pad, 5x9 (Home Health) 2 x Per Week/15 Days Discharge Instructions: Apply over primary dressing as directed. Com pression Wrap: Kerlix Roll 4.5x3.1 (in/yd) (Home Health) 2 x Per Week/15 Days Discharge Instructions: Apply Kerlix and Coban compression as directed. Com pression Wrap: Coban Self-Adherent Wrap 4x5 (in/yd) (Home Health) 2 x Per Week/15 Days Discharge Instructions: Apply over Kerlix as directed. 1. Antifungal spray with Keystone antibiotic spray and silver alginate  under Kerlix/Cobanooleft lower extremity 2. Follow-up in 1 week Electronic Signature(s) Signed: 01/23/2023 12:19:22 PM By: Kalman Shan DO Entered By: Kalman Shan on 01/23/2023 11:54:04 -------------------------------------------------------------------------------- HxROS Details Patient Name: Date of Service: [place], DO [place]. 01/23/2023 11:00 A M Medical Record Number: [id number] Patient Account Number: [id number] Date of Birth/Sex: Treating RN: 1918/10/23 (89 y.o. F) Primary Care Provider: Marlowe Sax Other Clinician: Referring Provider: Treating Provider/Extender: Grier Rocher, Dinah Weeks in Treatment: 2 Cardiovascular Medical History: Positive for: Hypertension; Peripheral Venous Disease Gastrointestinal Medical History: Past Medical History Notes: laryngopharyngeal reflux  disease diverticulosis Musculoskeletal Medical History: Past Medical History Notes: osteoporosis carpel tunnel cervical spondylosis DDD TRISSA, FANKHAUSER ([id number]) [id number] Page 6 of 6 Immunizations Pneumococcal Vaccine: Received Pneumococcal Vaccination: Yes Received Pneumococcal Vaccination On or After 60th Birthday: Yes Implantable Devices No devices added Hospitalization / Surgery History Type of Hospitalization/Surgery left mastectomy Family and Social History Never smoker; Marital Status - Widowed; Alcohol Use: Never; Drug Use: No History; Caffeine Use: Never; Financial Concerns: No; Food, Clothing or Shelter Needs: No; Support System Lacking: No Electronic Signature(s) Signed: 01/23/2023 12:19:22 PM By: Kalman Shan DO Entered By: Kalman Shan on 01/23/2023 11:51:32 -------------------------------------------------------------------------------- SuperBill Details Patient Name: Date of Service: [place], DO [place]. 01/23/2023 Medical Record Number: [id number] Patient Account Number: [id number] Date of Birth/Sex: Treating RN: 06-05-1918 (89 y.o. Tonita Phoenix, Lauren Primary Care Provider: Marlowe Sax Other Clinician: Referring Provider: Treating Provider/Extender: Grier Rocher, Dinah Weeks in Treatment: 2 Diagnosis Coding ICD-10 Codes Code Description [phone] Non-pressure chronic ulcer of other part of left lower leg limited to breakdown of skin I87.312 Chronic venous hypertension (idiopathic) with ulcer of left lower extremity [id number] Chronic diastolic (congestive) heart failure I10 Essential (primary) hypertension Z85.3 Personal history of malignant neoplasm of breast Facility Procedures : CPT4 Code: [id number] Description: 99213 - WOUND CARE VISIT-LEV 3 EST PT Modifier: Quantity: 1 Physician Procedures : CPT4 Code Description Modifier [id number] - WC PHYS LEVEL 3 - EST PT ICD-10 Diagnosis Description  L97.821 Non-pressure chronic ulcer of other part of left lower leg limited to breakdown of skin I87.312 Chronic venous hypertension (idiopathic) with  ulcer of left lower extremity 99991111 Chronic diastolic (congestive) heart failure Z85.3 Personal history of malignant neoplasm of breast Quantity: 1 Electronic Signature(s) Signed: 01/23/2023 12:19:22 PM By: Kalman Shan DO Entered By: Kalman Shan on 01/23/2023 11:54:15

## 2023-01-27 DIAGNOSIS — K449 Diaphragmatic hernia without obstruction or gangrene: Secondary | ICD-10-CM | POA: Diagnosis not present

## 2023-01-27 DIAGNOSIS — K219 Gastro-esophageal reflux disease without esophagitis: Secondary | ICD-10-CM | POA: Diagnosis not present

## 2023-01-27 DIAGNOSIS — I110 Hypertensive heart disease with heart failure: Secondary | ICD-10-CM | POA: Diagnosis not present

## 2023-01-27 DIAGNOSIS — J309 Allergic rhinitis, unspecified: Secondary | ICD-10-CM | POA: Diagnosis not present

## 2023-01-27 DIAGNOSIS — M810 Age-related osteoporosis without current pathological fracture: Secondary | ICD-10-CM | POA: Diagnosis not present

## 2023-01-27 DIAGNOSIS — G894 Chronic pain syndrome: Secondary | ICD-10-CM | POA: Diagnosis not present

## 2023-01-27 DIAGNOSIS — K5730 Diverticulosis of large intestine without perforation or abscess without bleeding: Secondary | ICD-10-CM | POA: Diagnosis not present

## 2023-01-27 DIAGNOSIS — F411 Generalized anxiety disorder: Secondary | ICD-10-CM | POA: Diagnosis not present

## 2023-01-27 DIAGNOSIS — I4510 Unspecified right bundle-branch block: Secondary | ICD-10-CM | POA: Diagnosis not present

## 2023-01-27 DIAGNOSIS — M47812 Spondylosis without myelopathy or radiculopathy, cervical region: Secondary | ICD-10-CM | POA: Diagnosis not present

## 2023-01-27 DIAGNOSIS — K5900 Constipation, unspecified: Secondary | ICD-10-CM | POA: Diagnosis not present

## 2023-01-27 DIAGNOSIS — I5032 Chronic diastolic (congestive) heart failure: Secondary | ICD-10-CM | POA: Diagnosis not present

## 2023-01-27 DIAGNOSIS — I872 Venous insufficiency (chronic) (peripheral): Secondary | ICD-10-CM | POA: Diagnosis not present

## 2023-01-27 DIAGNOSIS — F0284 Dementia in other diseases classified elsewhere, unspecified severity, with anxiety: Secondary | ICD-10-CM | POA: Diagnosis not present

## 2023-01-27 DIAGNOSIS — G301 Alzheimer's disease with late onset: Secondary | ICD-10-CM | POA: Diagnosis not present

## 2023-01-27 DIAGNOSIS — L97821 Non-pressure chronic ulcer of other part of left lower leg limited to breakdown of skin: Secondary | ICD-10-CM | POA: Diagnosis not present

## 2023-01-28 ENCOUNTER — Ambulatory Visit: Payer: Medicare Other | Admitting: Podiatry

## 2023-01-28 DIAGNOSIS — B351 Tinea unguium: Secondary | ICD-10-CM

## 2023-01-28 DIAGNOSIS — M79675 Pain in left toe(s): Secondary | ICD-10-CM

## 2023-01-28 DIAGNOSIS — M79674 Pain in right toe(s): Secondary | ICD-10-CM | POA: Diagnosis not present

## 2023-01-28 NOTE — Progress Notes (Signed)
   Chief Complaint  Patient presents with   Nail Problem    Nail trim     SUBJECTIVE Patient presents to office today complaining of elongated, thickened nails that cause pain while ambulating in shoes.  Patient is unable to trim their own nails.  Patient has history of left lower extremity wounds and is seen routinely at the [name] wound care center.  Patient is here for further evaluation and treatment.  Past Medical History:  Diagnosis Date   Abdominal pain, left lower quadrant 06/22/2013   Abnormal CT scan of lung 07/16/2015   Allergic rhinitis, cause unspecified 02/21/2014   Anxiety state, unspecified    Breast cancer ([place])    NO STICK OR BLOOD PRESSURE CHECKS IN LEFT ARM   Carpal tunnel syndrome    Carpal tunnel syndrome on both sides 10/03/2008   Qualifier: Diagnosis of  By: Lenna Gilford MD, Deborra Medina    Cervical spondylosis    Chronic pain syndrome 03/03/2019   Colon polyp 12/19/2009   Transverse-polypoid colorectal mucosa   Constipation    Cough 10/21/2014   Degenerative joint disease    Diastolic dysfunction    Diverticula of colon    Diverticulosis of colon 05/24/2002   Qualifier: Diagnosis of  By: Jerral Ralph     Gastritis, chronic    Hiatal hernia    Hypertension    Low back pain    LPRD (laryngopharyngeal reflux disease) 04/04/2014   Osteoporosis 05/22/2020   RBBB (right bundle branch block with left anterior fascicular block)    Renal cyst    Venous insufficiency     Allergies  Allergen Reactions   Fentanyl Nausea Only and Other (See Comments)    Pt states that she is not allergic     Naprosyn [Naproxen] Other (See Comments)    Patient felt tongue and face swelling. Went to ED where no visible swelling noted     OBJECTIVE General Patient is awake, alert, and oriented x 3 and in no acute distress. Derm Skin is dry and supple bilateral.  There is some maceration with drainage noted to the left foot and she is currently being managed at the [facility] wound  care center  Vasc toes are cold to touch.  Diminished pulses Neuro grossly intact via light touch Musculoskeletal Exam no pedal deformity noted.  Multilayer dressings are left intact to the left lower extremity encompassing the leg ankle and foot.  ASSESSMENT 1.  Pain due to onychomycosis of toenails both  PLAN OF CARE 1. Patient evaluated today.  2.  Continue management at the [facility] wound care center weekly 3. Mechanical debridement of nails 1-5 bilaterally performed using a nail nipper. Filed with dremel without incident.  4. Return to clinic in 3 mos.   *Son's name is Denman George   Edrick Kins, DPM [facility]  Dr. Edrick Kins, DPM    [address],  [address]                Office [phone]  Fax [phone]

## 2023-01-29 DIAGNOSIS — G894 Chronic pain syndrome: Secondary | ICD-10-CM | POA: Diagnosis not present

## 2023-01-29 DIAGNOSIS — K219 Gastro-esophageal reflux disease without esophagitis: Secondary | ICD-10-CM | POA: Diagnosis not present

## 2023-01-29 DIAGNOSIS — F411 Generalized anxiety disorder: Secondary | ICD-10-CM | POA: Diagnosis not present

## 2023-01-29 DIAGNOSIS — G301 Alzheimer's disease with late onset: Secondary | ICD-10-CM | POA: Diagnosis not present

## 2023-01-29 DIAGNOSIS — M47812 Spondylosis without myelopathy or radiculopathy, cervical region: Secondary | ICD-10-CM | POA: Diagnosis not present

## 2023-01-29 DIAGNOSIS — I5032 Chronic diastolic (congestive) heart failure: Secondary | ICD-10-CM | POA: Diagnosis not present

## 2023-01-29 DIAGNOSIS — K5900 Constipation, unspecified: Secondary | ICD-10-CM | POA: Diagnosis not present

## 2023-01-29 DIAGNOSIS — L97821 Non-pressure chronic ulcer of other part of left lower leg limited to breakdown of skin: Secondary | ICD-10-CM | POA: Diagnosis not present

## 2023-01-29 DIAGNOSIS — I110 Hypertensive heart disease with heart failure: Secondary | ICD-10-CM | POA: Diagnosis not present

## 2023-01-29 DIAGNOSIS — J309 Allergic rhinitis, unspecified: Secondary | ICD-10-CM | POA: Diagnosis not present

## 2023-01-29 DIAGNOSIS — I4510 Unspecified right bundle-branch block: Secondary | ICD-10-CM | POA: Diagnosis not present

## 2023-01-29 DIAGNOSIS — K5730 Diverticulosis of large intestine without perforation or abscess without bleeding: Secondary | ICD-10-CM | POA: Diagnosis not present

## 2023-01-29 DIAGNOSIS — I872 Venous insufficiency (chronic) (peripheral): Secondary | ICD-10-CM | POA: Diagnosis not present

## 2023-01-29 DIAGNOSIS — K449 Diaphragmatic hernia without obstruction or gangrene: Secondary | ICD-10-CM | POA: Diagnosis not present

## 2023-01-29 DIAGNOSIS — M810 Age-related osteoporosis without current pathological fracture: Secondary | ICD-10-CM | POA: Diagnosis not present

## 2023-01-29 DIAGNOSIS — F0284 Dementia in other diseases classified elsewhere, unspecified severity, with anxiety: Secondary | ICD-10-CM | POA: Diagnosis not present

## 2023-01-30 ENCOUNTER — Encounter (HOSPITAL_BASED_OUTPATIENT_CLINIC_OR_DEPARTMENT_OTHER): Payer: Medicare Other | Admitting: Internal Medicine

## 2023-01-30 DIAGNOSIS — I5032 Chronic diastolic (congestive) heart failure: Secondary | ICD-10-CM

## 2023-01-30 DIAGNOSIS — Z853 Personal history of malignant neoplasm of breast: Secondary | ICD-10-CM | POA: Diagnosis not present

## 2023-01-30 DIAGNOSIS — L97821 Non-pressure chronic ulcer of other part of left lower leg limited to breakdown of skin: Secondary | ICD-10-CM

## 2023-01-30 DIAGNOSIS — F0390 Unspecified dementia without behavioral disturbance: Secondary | ICD-10-CM | POA: Diagnosis not present

## 2023-01-30 DIAGNOSIS — I10 Essential (primary) hypertension: Secondary | ICD-10-CM | POA: Diagnosis not present

## 2023-01-30 DIAGNOSIS — I87312 Chronic venous hypertension (idiopathic) with ulcer of left lower extremity: Secondary | ICD-10-CM | POA: Diagnosis not present

## 2023-01-30 DIAGNOSIS — I110 Hypertensive heart disease with heart failure: Secondary | ICD-10-CM | POA: Diagnosis not present

## 2023-01-31 NOTE — Progress Notes (Signed)
Heather [name] ([id number]) [id number] Page 1 of 8 Visit Report for 01/30/2023 Arrival Information Details Patient Name: Date of Service: HA Heather [name]. 01/30/2023 1:30 PM Medical Record Number: [id number] Patient Account Number: [id number] Date of Birth/Sex: Treating RN: 23-Feb-1918 (89 y.o. Heather [name], Heather [name] Primary Care Heather [name]: Heather [name] Other Clinician: Referring Heather [name]: Treating Heather [name]/Extender: Heather [name], Heather [name]: 3 Visit Information History Since Last Visit Added or deleted any medications: No Patient Arrived: Wheel Chair Any new allergies or adverse reactions: No Arrival Time: 13:53 Had a fall or experienced change in No Accompanied By: son activities of daily living that may affect Transfer Assistance: Manual risk of falls: Patient Identification Verified: Yes Signs or symptoms of abuse/neglect since last visito No Secondary Verification Process Completed: Yes Hospitalized since last visit: No Patient Requires Transmission-Based Precautions: No Implantable device outside of the clinic excluding No Patient Has Alerts: Yes cellular tissue based products placed in the center Patient Alerts: ABI's:L:1.08 10/23 since last visit: Has Dressing in Place as Prescribed: Yes Has Compression in Place as Prescribed: Yes Pain Present Now: Yes Electronic Signature(s) Signed: 01/30/2023 5:52:30 PM By: Heather Pilling RN, BSN Entered By: Heather [name] on 01/30/2023 14:01:01 -------------------------------------------------------------------------------- Clinic Level of Care Assessment Details Patient Name: Date of Service: [name], DO Heather [name]. 01/30/2023 1:30 PM Medical Record Number: [id number] Patient Account Number: [id number] Date of Birth/Sex: Treating RN: 1918/05/14 (89 y.o. Heather [name] Primary Care Heather [name]: Heather [name] Other Clinician: Referring Heather [name]: Treating Heather [name]/Extender: Heather [name], Heather [name]: 3 Clinic Level of Care Assessment Items TOOL 4 Quantity Score X- 1 0 Use when only an EandM is performed on FOLLOW-UP visit ASSESSMENTS - Nursing Assessment / Reassessment X- 1 10 Reassessment of Co-morbidities (includes updates in patient status) X- 1 5 Reassessment of Adherence to [name] Plan ASSESSMENTS - Wound and Skin A ssessment / Reassessment X - Simple Wound Assessment / Reassessment - one wound 1 5 []  - 0 Complex Wound Assessment / Reassessment - multiple wounds X- 1 10 Dermatologic / Skin Assessment (not related to wound area) ASSESSMENTS - Focused Assessment X- 1 5 Circumferential Edema Measurements - multi extremities X- 1 10 Nutritional Assessment / Counseling / Intervention []  - 0 Lower Extremity Assessment (monofilament, tuning fork, pulses) []  - 0 Peripheral Arterial Disease Assessment (using hand held doppler) ASSESSMENTS - Ostomy and/or Continence Assessment and Care []  - 0 Incontinence Assessment and Management []  - 0 Ostomy Care Assessment and Management (repouching, etc.) PROCESS - Coordination of Care Heather [name] ([id number]) [id number] Page 2 of 8 X- 1 15 Simple Patient / Family Education for ongoing care []  - 0 Complex (extensive) Patient / Family Education for ongoing care X- 1 10 Staff obtains Programmer, systems, Records, T Results / Process Orders est X- 1 10 Staff telephones HHA, Nursing Homes / Clarify orders / etc []  - 0 Routine Transfer to another Facility (non-emergent condition) []  - 0 Routine Hospital Admission (non-emergent condition) []  - 0 New Admissions / Biomedical engineer / Ordering NPWT Apligraf, etc. , []  - 0 Emergency Hospital Admission (emergent condition) X- 1 10 Simple Discharge Coordination []  - 0 Complex (extensive) Discharge Coordination PROCESS - Special Needs []  - 0 Pediatric / Minor Patient Management []  - 0 Isolation Patient  Management []  - 0 Hearing / Language / Visual special needs []  - 0 Assessment of Community assistance (transportation, D/C planning, etc.) []  - 0 Additional assistance / Altered mentation []  - 0 Support Surface(s) Assessment (bed, cushion,  seat, etc.) INTERVENTIONS - Wound Cleansing / Measurement X - Simple Wound Cleansing - one wound 1 5 []  - 0 Complex Wound Cleansing - multiple wounds X- 1 5 Wound Imaging (photographs - any number of wounds) []  - 0 Wound Tracing (instead of photographs) X- 1 5 Simple Wound Measurement - one wound []  - 0 Complex Wound Measurement - multiple wounds INTERVENTIONS - Wound Dressings []  - 0 Small Wound Dressing one or multiple wounds []  - 0 Medium Wound Dressing one or multiple wounds X- 1 20 Large Wound Dressing one or multiple wounds X- 1 5 Application of Medications - topical []  - 0 Application of Medications - injection INTERVENTIONS - Miscellaneous []  - 0 External ear exam []  - 0 Specimen Collection (cultures, biopsies, blood, body fluids, etc.) []  - 0 Specimen(s) / Culture(s) sent or taken to Lab for analysis []  - 0 Patient Transfer (multiple staff / Civil Service fast streamer / Similar devices) []  - 0 Simple Staple / Suture removal (25 or less) []  - 0 Complex Staple / Suture removal (26 or more) []  - 0 Hypo / Hyperglycemic Management (close monitor of Blood Glucose) []  - 0 Ankle / Brachial Index (ABI) - do not check if billed separately X- 1 5 Vital Signs Has the patient been seen at the hospital within the last three years: Yes Total Score: 135 Level Of Care: New/Established - Level 4 Electronic Signature(s) Signed: 01/30/2023 5:52:30 PM By: Heather Pilling RN, BSN Heather [name], Heather [name] ([id number]) [id number] Page 3 of 8 Entered By: Heather [name] on 01/30/2023 14:11:12 -------------------------------------------------------------------------------- Encounter Discharge Information Details Patient Name: Date of  Service: HA Heather [name] 01/30/2023 1:30 PM Medical Record Number: [id number] Patient Account Number: [id number] Date of Birth/Sex: Treating RN: 1918/05/11 (89 y.o. Heather [name] Primary Care Heather [name] Bedgood: Heather [name] Other Clinician: Referring Matilynn Dacey: Treating Charlestine Rookstool/Extender: Elsworth Soho in [name]: 3 Encounter Discharge Information Items Discharge Condition: Stable Ambulatory Status: Wheelchair Discharge Destination: Home Transportation: Private Auto Accompanied By: son Schedule Follow-up Appointment: Yes Clinical Summary of Care: Electronic Signature(s) Signed: 01/30/2023 5:52:30 PM By: Heather Pilling RN, BSN Entered By: Heather [name] on 01/30/2023 14:11:37 -------------------------------------------------------------------------------- Lower Extremity Assessment Details Patient Name: Date of Service: [name], DO [place]. 01/30/2023 1:30 PM Medical Record Number: [id number] Patient Account Number: [id number] Date of Birth/Sex: Treating RN: 02/06/1918 (89 y.o. Heather [name] Primary Care Kalianne Fetting: Heather [name] Other Clinician: Referring Alfonse Garringer: Treating Quirino Kakos/Extender: Heather [name], Heather [name]: 3 Edema Assessment Assessed: [Left: Yes] [Right: No] Edema: [Left: Ye] [Right: s] Calf Left: Right: Point of Measurement: 32 cm From Medial Instep 29 cm Ankle Left: Right: Point of Measurement: 10 cm From Medial Instep 25 cm Electronic Signature(s) Signed: 01/30/2023 5:52:30 PM By: Heather Pilling RN, BSN Entered By: Heather [name] on 01/30/2023 14:01:30 -------------------------------------------------------------------------------- Multi Wound Chart Details Patient Name: Date of Service: [name], DO [place]. 01/30/2023 1:30 PM Medical Record Number: [id number] Patient Account Number: [id number] Date of Birth/Sex: Treating RN: 10-Mar-1918 (89 y.o. F) Primary Care Wyat Infinger: Heather [name] Other  Clinician: Referring Brynnley Dayrit: Treating Lakeyia Surber/Extender: Heather [name], Heather [name]: 3 Vital Signs Height(in): Pulse(bpm): 74 Weight(lbs): Blood Pressure(mmHg): 119/75 Body Mass Index(BMI): KEIGHLEY, NITSCH ([id number]) [id number] Page 4 of 8 Temperature(F): 97.9 Respiratory Rate(breaths/min): 16 [2:Photos:] [N/A:N/A] Left, Circumferential Lower Leg N/A N/A Wound Location: Gradually Appeared N/A N/A Wounding Event: Venous Leg Ulcer N/A N/A Primary Etiology: Hypertension, Peripheral Venous N/A N/A Comorbid History: Disease 01/09/2023 N/A N/A Date Acquired: 3 N/A N/A [name]  of [name]: Open N/A N/A Wound Status: No N/A N/A Wound Recurrence: 15x25x0.1 N/A N/A Measurements L x W x D (cm) 294.524 N/A N/A A (cm) : rea 29.452 N/A N/A Volume (cm) : 8.10% N/A N/A % Reduction in Area: 8.10% N/A N/A % Reduction in Volume: Full Thickness Without Exposed N/A N/A Classification: Support Structures Medium N/A N/A Exudate Amount: Serosanguineous N/A N/A Exudate Type: red, brown N/A N/A Exudate Color: Distinct, outline attached N/A N/A Wound Margin: Large (67-100%) N/A N/A Granulation Amount: Red, Pink N/A N/A Granulation Quality: Fascia: No N/A N/A Exposed Structures: Fat Layer (Subcutaneous Tissue): No Tendon: No Muscle: No Joint: No Bone: No Small (1-33%) N/A N/A Epithelialization: Excoriation: No N/A N/A Periwound Skin Texture: Induration: No Callus: No Crepitus: No Rash: No Scarring: No Maceration: Yes N/A N/A Periwound Skin Moisture: Dry/Scaly: No Hemosiderin Staining: Yes N/A N/A Periwound Skin Color: Atrophie Blanche: No Cyanosis: No Ecchymosis: No Erythema: No Mottled: No Pallor: No Rubor: No No Abnormality N/A N/A Temperature: Yes N/A N/A Tenderness on Palpation: [name] Notes Wound #2 (Lower Leg) Wound Laterality: Left, Circumferential Cleanser Soap and Water Discharge  Instruction: May shower and wash wound with dial antibacterial soap and water prior to dressing change. Peri-Wound Care Triamcinolone 15 (g) Discharge Instruction: Use triamcinolone cream IN CLINIC ONLY. Topical Keystone antifungal spray Discharge Instruction: apply over the counter antifungal spray at home to the toes. Ketoconazole in cilnic. Primary Dressing [place], 4x4 (in/in) Heather, [name] ([id number]) [id number] Page 5 of 8 Discharge Instruction: Apply foot, ankle, and achilles area. Secondary Dressing ABD Pad, 5x9 Discharge Instruction: Apply over primary dressing as directed. Secured With Compression Wrap Kerlix Roll 4.5x3.1 (in/yd) Discharge Instruction: Apply Kerlix and Coban compression as directed. Coban Self-Adherent Wrap 4x5 (in/yd) Discharge Instruction: Apply over Kerlix as directed. Compression Stockings Add-Ons Electronic Signature(s) Signed: 01/30/2023 3:54:15 PM By: Heather Shan DO Entered By: Heather Shan on 01/30/2023 14:30:43 -------------------------------------------------------------------------------- Multi-Disciplinary Care Plan Details Patient Name: Date of Service: [name], DO [place] 01/30/2023 1:30 PM Medical Record Number: [id number] Patient Account Number: [id number] Date of Birth/Sex: Treating RN: 1918/07/11 (89 y.o. Heather [name] Primary Care Zareah Hunzeker: Heather [name] Other Clinician: Referring Kimmarie Pascale: Treating Genoveva Singleton/Extender: Heather [name], Heather [name]: 3 Active Inactive Wound/Skin Impairment Nursing Diagnoses: Impaired tissue integrity Knowledge deficit related to ulceration/compromised skin integrity Goals: Patient will have a decrease in wound volume by X% from date: (specify in notes) Date Initiated: 01/09/2023 Target Resolution Date: 03/20/2023 Goal Status: Active Patient/caregiver will verbalize understanding of skin care regimen Date Initiated:  01/09/2023 Target Resolution Date: 03/13/2023 Goal Status: Active Ulcer/skin breakdown will have a volume reduction of 30% by week 4 Date Initiated: 01/09/2023 Target Resolution Date: 01/30/2023 Goal Status: Active Interventions: Assess patient/caregiver ability to obtain necessary supplies Assess patient/caregiver ability to perform ulcer/skin care regimen upon admission and as needed Assess ulceration(s) every visit Notes: Electronic Signature(s) Signed: 01/30/2023 5:52:30 PM By: Heather Pilling RN, BSN Entered By: Heather [name] on 01/30/2023 14:06:13 -------------------------------------------------------------------------------- Pain Assessment Details Patient Name: Date of Service: [name], DO [place]. 01/30/2023 1:30 PM Medical Record Number: [id number] Patient Account Number: [id number] Heather, [name] ([id number]) [id number] Page 6 of 8 Date of Birth/Sex: Treating RN: January 21, 1918 (89 y.o. Heather [name] Primary Care Lenoir Facchini: Heather [name] Other Clinician: Referring Cadience Bradfield: Treating Dazia Lippold/Extender: Heather [name], Heather [name]: 3 Active Problems Location of Pain Severity and Description of Pain Patient Has Paino Yes Site Locations Pain Location: Generalized Pain, Pain in Ulcers Rate the  pain. Current Pain Level: 4 Pain Management and Medication Current Pain Management: Electronic Signature(s) Signed: 01/30/2023 5:52:30 PM By: Heather Pilling RN, BSN Entered By: Heather [name] on 01/30/2023 14:01:23 -------------------------------------------------------------------------------- Patient/Caregiver Education Details Patient Name: Date of Service: [name], DO Heather [name] 3/15/2024andnbsp1:30 PM Medical Record Number: [id number] Patient Account Number: [id number] Date of Birth/Gender: Treating RN: 1918/10/15 (89 y.o. Heather [name] Primary Care Physician: Heather [name] Other Clinician: Referring Physician: Treating  Physician/Extender: Elsworth Soho in [name]: 3 Education Assessment Education Provided To: Patient Education Topics Provided Wound/Skin Impairment: Handouts: Caring for Your Ulcer Methods: Explain/Verbal Responses: Reinforcements needed Electronic Signature(s) Signed: 01/30/2023 5:52:30 PM By: Heather Pilling RN, BSN Entered By: Heather [name] on 01/30/2023 14:06:29 -------------------------------------------------------------------------------- Wound Assessment Details Patient Name: Date of Service: [name], DO [place]. 01/30/2023 1:30 PM Medical Record Number: [id number] Patient Account Number: [id number] Date of Birth/Sex: Treating RN: March 11, 1918 (89 y.o. [address] [place], Heather [name] ([id number]) [id number] Page 7 of 8 Primary Care Ashly Goethe: Heather [name] Other Clinician: Referring Lenah Messenger: Treating Arpan Eskelson/Extender: Heather [name], Heather [name]: 3 Wound Status Wound Number: 2 Primary Etiology: Venous Leg Ulcer Wound Location: Left, Circumferential Lower Leg Wound Status: Open Wounding Event: Gradually Appeared Comorbid History: Hypertension, Peripheral Venous Disease Date Acquired: 01/09/2023 [name] Of [name]: 3 Clustered Wound: No Photos Wound Measurements Length: (cm) 15 Width: (cm) 25 Depth: (cm) 0.1 Area: (cm) 294.524 Volume: (cm) 29.452 % Reduction in Area: 8.1% % Reduction in Volume: 8.1% Epithelialization: Small (1-33%) Tunneling: No Undermining: No Wound Description Classification: Full Thickness Without Exposed Support Structures Wound Margin: Distinct, outline attached Exudate Amount: Medium Exudate Type: Serosanguineous Exudate Color: red, brown Foul Odor After Cleansing: No Slough/Fibrino No Wound Bed Granulation Amount: Large (67-100%) Exposed Structure Granulation Quality: Red, Pink Fascia Exposed: No Fat Layer (Subcutaneous Tissue) Exposed: No Tendon  Exposed: No Muscle Exposed: No Joint Exposed: No Bone Exposed: No Periwound Skin Texture Texture Color No Abnormalities Noted: No No Abnormalities Noted: No Callus: No Atrophie Blanche: No Crepitus: No Cyanosis: No Excoriation: No Ecchymosis: No Induration: No Erythema: No Rash: No Hemosiderin Staining: Yes Scarring: No Mottled: No Pallor: No Moisture Rubor: No No Abnormalities Noted: No Dry / Scaly: No Temperature / Pain Maceration: Yes Temperature: No Abnormality Tenderness on Palpation: Yes [name] Notes Wound #2 (Lower Leg) Wound Laterality: Left, Circumferential Cleanser Soap and Water Discharge Instruction: May shower and wash wound with dial antibacterial soap and water prior to dressing change. Peri-Wound Care Heather, [name] ([id number]) [id number] Page 8 of 8 Triamcinolone 15 (g) Discharge Instruction: Use triamcinolone cream IN CLINIC ONLY. Topical Keystone antifungal spray Discharge Instruction: apply over the counter antifungal spray at home to the toes. Ketoconazole in cilnic. Primary Dressing Sorbalgon AG Dressing, 4x4 (in/in) Discharge Instruction: Apply foot, ankle, and achilles area. Secondary Dressing ABD Pad, 5x9 Discharge Instruction: Apply over primary dressing as directed. Secured With Compression Wrap Kerlix Roll 4.5x3.1 (in/yd) Discharge Instruction: Apply Kerlix and Coban compression as directed. Coban Self-Adherent Wrap 4x5 (in/yd) Discharge Instruction: Apply over Kerlix as directed. Compression Stockings Add-Ons Electronic Signature(s) Signed: 01/30/2023 5:52:30 PM By: Heather Pilling RN, BSN Entered By: Heather [name] on 01/30/2023 14:05:39 -------------------------------------------------------------------------------- Vitals Details Patient Name: Date of Service: [name], DO [place]. 01/30/2023 1:30 PM Medical Record Number: [id number] Patient Account Number: [id number] Date of Birth/Sex: Treating  RN: [birth year] (89 y.o. Heather [name] Primary Care Sophea Rackham: Heather [name] Other Clinician: Referring Alexia Dinger: Treating Ellizabeth Dacruz/Extender: Heather [name], Heather [name]: 3 Vital Signs Time  Taken: 13:53 Temperature (F): 97.9 Pulse (bpm): 74 Respiratory Rate (breaths/min): 16 Blood Pressure (mmHg): 119/75 Reference Range: 80 - 120 mg / dl Electronic Signature(s) Signed: 01/30/2023 5:52:30 PM By: Heather Pilling RN, BSN Entered By: Heather [name] on 01/30/2023 14:01:16

## 2023-01-31 NOTE — Progress Notes (Signed)
HAEVEN, YEITER ([id number]) [id number] Page 1 of 7 Visit Report for 01/30/2023 Chief Complaint Document Details Patient Name: Date of Service: Heather [name] 01/30/2023 1:30 PM Medical Record Number: [id number] Patient Account Number: [id number] Date of Birth/Sex: Treating RN: 02-18-1918 (89 y.o. F) Primary Care Provider: Marlowe Sax Other Clinician: Referring Provider: Treating Provider/Extender: Grier Rocher, Dinah Weeks in Treatment: 3 Information Obtained from: Patient Chief Complaint 09/08/2022; left lower extremity wound 01/09/2023; left lower extremity areas of skin breakdown Electronic Signature(s) Signed: 01/30/2023 3:54:15 PM By: Kalman Shan DO Entered By: Kalman Shan on 01/30/2023 14:30:50 -------------------------------------------------------------------------------- HPI Details Patient Name: Date of Service: Heather [name], DO [place]. 01/30/2023 1:30 PM Medical Record Number: [id number] Patient Account Number: [id number] Date of Birth/Sex: Treating RN: 12/27/1917 (89 y.o. F) Primary Care Provider: Marlowe Sax Other Clinician: Referring Provider: Treating Provider/Extender: Grier Rocher, Dinah Weeks in Treatment: 3 History of Present Illness HPI Description: Admission 09/08/2022 Heather [name] is a 88 year old female with a past medical history of venous insufficiency, chronic diastolic heart failure, dementia, and breast cancer that presents to the clinic for a 4-month history of nonhealing ulcer to the left lower extremity. She states over the past 2 months the site has become more painful And is weeping more. She visited her primary care office on 10/17 and was prescribed doxycycline for cellulitis of the left lower extremity. It is unclear if this has helped her symptoms. She has home health and they have been using Medihoney under compression therapy. They come out twice weekly. Her ABIs in  office were 1.08. Currently she denies systemic signs of infection. 10/31; patient presents for follow-up. Patient grew high levels of Pseudomonas aeruginosa, Staphylococcus aureus and Enterococcus bacillus from pcr culture at last clinic visit. Keystone antibiotic ointment has been ordered for the patient, she has not yet received this. She has tolerated the wrap well. 11/14; patient presents for follow-up. She started using Keystone antibiotic spray over the past 1 to 2 weeks. She has reported significant improvement in wound healing. She denies any pain. She has a small wound remaining to the dorsal aspect of her foot. 11/21; patient presents for follow-up. At last clinic visit we have been using [facility] antibiotic spray with silver alginate under Kerlix/Coban. She states she ordered her compression stockings and they are in the mail. Her wounds have healed. 01/09/2023 Heather [name] is a 89 year old female with a past medical history of venous insufficiency, chronic diastolic heart failure, dementia and breast cancer that presents the clinic for a 2-week history of skin breakdown to the left lower extremity. She was seen in our clinic several months ago for this issue. She states that home health is coming out twice a week and wrapping her legs. She currently denies signs of infection. She does not want to wear compression stockings. 3/8; patient presents for follow-up. She has been using Keystone antibiotic spray with silver alginate under Kerlix/Coban. She states that home health has been coming out. She has no issues or complaints today. 3/15; patient presents for follow-up. We have been using Keystone antibiotic spray with silver alginate and antifungal spray under Kerlix/Coban. She has no issues or complaints today. Area appears improved since last clinic visit with less maceration. Electronic Signature(s) Signed: 01/30/2023 3:54:15 PM By: Kalman Shan DO Entered By: Kalman Shan on 01/30/2023 14:31:21 Physical Exam Details -------------------------------------------------------------------------------- Heather [name] ([id number]) [id number] Page 2 of 7 Patient Name: Date of Service: Heather [name], DO Heather J. 01/30/2023 1:30 PM  Medical Record Number: [id number] Patient Account Number: [id number] Date of Birth/Sex: Treating RN: 10-06-1918 (89 y.o. F) Primary Care Provider: Marlowe Sax Other Clinician: Referring Provider: Treating Provider/Extender: Kalman Shan Ngetich, Dinah Weeks in Treatment: 3 Constitutional respirations regular, non-labored and within target range for patient.. Cardiovascular 2+ dorsalis pedis/posterior tibialis pulses. Psychiatric pleasant and cooperative. Notes Left lower Extremity: Scattered areas of skin breakdown to the dorsal left foot side and distal portion of the leg. No signs of surrounding infection. Electronic Signature(s) Signed: 01/30/2023 3:54:15 PM By: Kalman Shan DO Entered By: Kalman Shan on 01/30/2023 14:32:11 -------------------------------------------------------------------------------- Physician Orders Details Patient Name: Date of Service: Heather [name], DO [place] 01/30/2023 1:30 PM Medical Record Number: [id number] Patient Account Number: [id number] Date of Birth/Sex: Treating RN: 1918-08-18 (89 y.o. [name] Primary Care Provider: Marlowe Sax Other Clinician: Referring Provider: Treating Provider/Extender: Kalman Shan Ngetich, Dinah Weeks in Treatment: 3 Verbal / Phone Orders: No Diagnosis Coding ICD-10 Coding Code Description [phone] Non-pressure chronic ulcer of other part of left lower leg limited to breakdown of skin I87.312 Chronic venous hypertension (idiopathic) with ulcer of left lower extremity [id number] Chronic diastolic (congestive) heart failure I10 Essential (primary) hypertension Z85.3 Personal history of malignant neoplasm of  breast Follow-up Appointments ppointment in 1 week. - Dr. Heber  1015 02/06/2023 Room 9. Return A Other: - Get some antifungal spray (over-the-counter) to use before the wrap goes on at each visit Bathing/ Shower/ Hygiene May shower with protection but do not get wound dressing(s) wet. Protect dressing(s) with water repellant cover (for example, large plastic bag) or a cast cover and may then take shower. Edema Control - Lymphedema / SCD / Other Elevate legs to the level of the heart or above for 30 minutes daily and/or when sitting for 3-4 times a day throughout the day. Avoid standing for long periods of time. Home Health No change in wound care orders this week; continue Home Health for wound care. May utilize formulary equivalent dressing for wound treatment orders unless otherwise specified. - Antifungal spray to toes, Keystone, Calcium Alginate Ag to foot, ankle, and achilles area, abd pad kerlix and coban wrap. Home health to change 2x a week. apply gauze between toes as well. Other Home Health Orders/Instructions: - advance home [facility] Wound Treatment Wound #2 - Lower Leg Wound Laterality: Left, Circumferential Cleanser: Soap and Water (Home Health) 2 x Per Week/15 Days Discharge Instructions: May shower and wash wound with dial antibacterial soap and water prior to dressing change. Peri-Wound Care: Triamcinolone 15 (g) 2 x Per Week/15 Days Discharge Instructions: Use triamcinolone cream IN CLINIC ONLY. Topical: [facility]) 2 x Per Week/15 Days Heather [name], Heather [name] ([id number]) [id number] Page 3 of 7 Topical: antifungal spray (Home Health) 2 x Per Week/15 Days Discharge Instructions: apply over the counter antifungal spray at home to the toes. Ketoconazole in cilnic. Prim Dressing: Sorbalgon AG Dressing, 4x4 (in/in) (Home Health) 2 x Per Week/15 Days ary Discharge Instructions: Apply foot, ankle, and achilles area. Secondary Dressing: ABD Pad,  5x9 (Home Health) 2 x Per Week/15 Days Discharge Instructions: Apply over primary dressing as directed. Compression Wrap: Kerlix Roll 4.5x3.1 (in/yd) (Home Health) 2 x Per Week/15 Days Discharge Instructions: Apply Kerlix and Coban compression as directed. Compression Wrap: Coban Self-Adherent Wrap 4x5 (in/yd) (Home Health) 2 x Per Week/15 Days Discharge Instructions: Apply over Kerlix as directed. Electronic Signature(s) Signed: 01/30/2023 3:54:15 PM By: Kalman Shan DO Entered By: Kalman Shan on 01/30/2023 14:32:19 -------------------------------------------------------------------------------- Problem List Details Patient Name: Date of  Service: Heather [name], DO Heather J. 01/30/2023 1:30 PM Medical Record Number: [id number] Patient Account Number: [id number] Date of Birth/Sex: Treating RN: 01-Apr-1918 (88 y.o. [name] Primary Care Provider: Marlowe Sax Other Clinician: Referring Provider: Treating Provider/Extender: Grier Rocher, Dinah Weeks in Treatment: 3 Active Problems ICD-10 Encounter Code Description Active Date MDM Diagnosis L97.821 Non-pressure chronic ulcer of other part of left lower leg limited to breakdown 01/09/2023 No Yes of skin I87.312 Chronic venous hypertension (idiopathic) with ulcer of left lower extremity 01/09/2023 No Yes [id number] Chronic diastolic (congestive) heart failure 01/09/2023 No Yes I10 Essential (primary) hypertension 01/09/2023 No Yes Z85.3 Personal history of malignant neoplasm of breast 01/09/2023 No Yes Inactive Problems Resolved Problems Electronic Signature(s) Signed: 01/30/2023 3:54:15 PM By: Kalman Shan DO Entered By: Kalman Shan on 01/30/2023 14:30:39 -------------------------------------------------------------------------------- Progress Note Details Patient Name: Date of Service: Heather [name], DO [place]. 01/30/2023 1:30 PM Heather [name] ([id number]) [id number] Page 4 of  7 Medical Record Number: [id number] Patient Account Number: [id number] Date of Birth/Sex: Treating RN: 06-17-1918 (89 y.o. F) Primary Care Provider: Marlowe Sax Other Clinician: Referring Provider: Treating Provider/Extender: Grier Rocher, Dinah Weeks in Treatment: 3 Subjective Chief Complaint Information obtained from Patient 09/08/2022; left lower extremity wound 01/09/2023; left lower extremity areas of skin breakdown History of Present Illness (HPI) Admission 09/08/2022 [name] is a 89 year old female with a past medical history of venous insufficiency, chronic diastolic heart failure, dementia, and breast cancer that presents to the clinic for a 48-month history of nonhealing ulcer to the left lower extremity. She states over the past 2 months the site has become more painful And is weeping more. She visited her primary care office on 10/17 and was prescribed doxycycline for cellulitis of the left lower extremity. It is unclear if this has helped her symptoms. She has home health and they have been using Medihoney under compression therapy. They come out twice weekly. Her ABIs in office were 1.08. Currently she denies systemic signs of infection. 10/31; patient presents for follow-up. Patient grew high levels of Pseudomonas aeruginosa, Staphylococcus aureus and Enterococcus bacillus from pcr culture at last clinic visit. Keystone antibiotic ointment has been ordered for the patient, she has not yet received this. She has tolerated the wrap well. 11/14; patient presents for follow-up. She started using Keystone antibiotic spray over the past 1 to 2 weeks. She has reported significant improvement in wound healing. She denies any pain. She has a small wound remaining to the dorsal aspect of her foot. 11/21; patient presents for follow-up. At last clinic visit we have been using [facility] antibiotic spray with silver alginate under Kerlix/Coban. She states she ordered  her compression stockings and they are in the mail. Her wounds have healed. 01/09/2023 [name] is a 89 year old female with a past medical history of venous insufficiency, chronic diastolic heart failure, dementia and breast cancer that presents the clinic for a 2-week history of skin breakdown to the left lower extremity. She was seen in our clinic several months ago for this issue. She states that home health is coming out twice a week and wrapping her legs. She currently denies signs of infection. She does not want to wear compression stockings. 3/8; patient presents for follow-up. She has been using Keystone antibiotic spray with silver alginate under Kerlix/Coban. She states that home health has been coming out. She has no issues or complaints today. 3/15; patient presents for follow-up. We have been using Keystone antibiotic spray with silver alginate  and antifungal spray under Kerlix/Coban. She has no issues or complaints today. Area appears improved since last clinic visit with less maceration. Patient History Social History Never smoker, Marital Status - Widowed, Alcohol Use - Never, Drug Use - No History, Caffeine Use - Never. Medical History Cardiovascular Patient has history of Hypertension, Peripheral Venous Disease Hospitalization/Surgery History - left mastectomy. Medical A Surgical History Notes nd Gastrointestinal laryngopharyngeal reflux disease diverticulosis Musculoskeletal osteoporosis carpel tunnel cervical spondylosis DDD Objective Constitutional respirations regular, non-labored and within target range for patient.. Vitals Time Taken: 1:53 PM, Temperature: 97.9 F, Pulse: 74 bpm, Respiratory Rate: 16 breaths/min, Blood Pressure: 119/75 mmHg. Cardiovascular 2+ dorsalis pedis/posterior tibialis pulses. Psychiatric pleasant and cooperative. General Notes: Left lower Extremity: Scattered areas of skin breakdown to the dorsal left foot side and distal  portion of the leg. No signs of surrounding infection. Integumentary (Hair, Skin) Heather [name], Heather [name] ([id number]) [id number] Page 5 of 7 Wound #2 status is Open. Original cause of wound was Gradually Appeared. The date acquired was: 01/09/2023. The wound has been in treatment 3 weeks. The wound is located on the Left,Circumferential Lower Leg. The wound measures 15cm length x 25cm width x 0.1cm depth; 294.524cm^2 area and 29.452cm^3 volume. There is no tunneling or undermining noted. There is a medium amount of serosanguineous drainage noted. The wound margin is distinct with the outline attached to the wound base. There is large (67-100%) red, pink granulation within the wound bed. The periwound skin appearance exhibited: Maceration, Hemosiderin Staining. The periwound skin appearance did not exhibit: Callus, Crepitus, Excoriation, Induration, Rash, Scarring, Dry/Scaly, Atrophie Blanche, Cyanosis, Ecchymosis, Mottled, Pallor, Rubor, Erythema. Periwound temperature was noted as No Abnormality. The periwound has tenderness on palpation. Assessment Active Problems ICD-10 Non-pressure chronic ulcer of other part of left lower leg limited to breakdown of skin Chronic venous hypertension (idiopathic) with ulcer of left lower extremity Chronic diastolic (congestive) heart failure Essential (primary) hypertension Personal history of malignant neoplasm of breast Overall patient's area of skin breakdown has improved in appearance with less maceration. Will add steroid cream to this. Continue Keystone antibiotic spray with antifungal spray and silver alginate under Kerlix/Coban. Follow-up in 1 week. Plan Follow-up Appointments: Return Appointment in 1 week. - Dr. Heber [place] 1015 02/06/2023 Room 9. Other: - Get some antifungal spray (over-the-counter) to use before the wrap goes on at each visit Bathing/ Shower/ Hygiene: May shower with protection but do not get wound dressing(s)  wet. Protect dressing(s) with water repellant cover (for example, large plastic bag) or a cast cover and may then take shower. Edema Control - Lymphedema / SCD / Other: Elevate legs to the level of the heart or above for 30 minutes daily and/or when sitting for 3-4 times a day throughout the day. Avoid standing for long periods of time. Home Health: No change in wound care orders this week; continue Home Health for wound care. May utilize formulary equivalent dressing for wound treatment orders unless otherwise specified. - Antifungal spray to toes, Keystone, Calcium Alginate Ag to foot, ankle, and achilles area, abd pad kerlix and coban wrap. Home health to change 2x a week. apply gauze between toes as well. Other Home Health Orders/Instructions: - advance home health WOUND #2: - Lower Leg Wound Laterality: Left, Circumferential Cleanser: Soap and Water (Home Health) 2 x Per Week/15 Days Discharge Instructions: May shower and wash wound with dial antibacterial soap and water prior to dressing change. Peri-Wound Care: Triamcinolone 15 (g) 2 x Per Week/15 Days Discharge Instructions: Use triamcinolone cream  IN CLINIC ONLY. Topical: Keystone ([facility]) 2 x Per Week/15 Days Topical: antifungal spray (Home Health) 2 x Per Week/15 Days Discharge Instructions: apply over the counter antifungal spray at home to the toes. Ketoconazole in cilnic. Prim Dressing: Sorbalgon AG Dressing, 4x4 (in/in) (Home Health) 2 x Per Week/15 Days ary Discharge Instructions: Apply foot, ankle, and achilles area. Secondary Dressing: ABD Pad, 5x9 (Home Health) 2 x Per Week/15 Days Discharge Instructions: Apply over primary dressing as directed. Com pression Wrap: Kerlix Roll 4.5x3.1 (in/yd) (Home Health) 2 x Per Week/15 Days Discharge Instructions: Apply Kerlix and Coban compression as directed. Com pression Wrap: Coban Self-Adherent Wrap 4x5 (in/yd) (Home Health) 2 x Per Week/15 Days Discharge Instructions: Apply  over Kerlix as directed. 1. Silver alginate with Keystone antibiotic spray, antifungal spray TCA under Kerlix/Coban 2. Follow-up in 1 week Electronic Signature(s) Signed: 01/30/2023 3:54:15 PM By: Kalman Shan DO Entered By: Kalman Shan on 01/30/2023 14:33:02 -------------------------------------------------------------------------------- HxROS Details Patient Name: Date of Service: Heather [name], DO [place] 01/30/2023 1:30 PM Medical Record Number: [id number] Patient Account Number: [id number] Date of Birth/Sex: Treating RN: [birth year] (89 y.o. F) Primary Care Provider: Marlowe Sax Other Clinician: Referring Provider: Treating Provider/Extender: Grier Rocher, Dinah Weeks in Treatment: 3 [place], Heather [name] ([id number]) [id number] Page 6 of 7 Cardiovascular Medical History: Positive for: Hypertension; Peripheral Venous Disease Gastrointestinal Medical History: Past Medical History Notes: laryngopharyngeal reflux disease diverticulosis Musculoskeletal Medical History: Past Medical History Notes: osteoporosis carpel tunnel cervical spondylosis DDD Immunizations Pneumococcal Vaccine: Received Pneumococcal Vaccination: Yes Received Pneumococcal Vaccination On or After 60th Birthday: Yes Implantable Devices No devices added Hospitalization / Surgery History Type of Hospitalization/Surgery left mastectomy Family and Social History Never smoker; Marital Status - Widowed; Alcohol Use: Never; Drug Use: No History; Caffeine Use: Never; Financial Concerns: No; Food, Clothing or Shelter Needs: No; Support System Lacking: No Electronic Signature(s) Signed: 01/30/2023 3:54:15 PM By: Kalman Shan DO Entered By: Kalman Shan on 01/30/2023 14:31:28 -------------------------------------------------------------------------------- SuperBill Details Patient Name: Date of Service: Heather [name], DO [place]. 01/30/2023 Medical Record Number:  [id number] Patient Account Number: [id number] Date of Birth/Sex: Treating RN: 1918/02/29 (89 y.o. [name] Primary Care Provider: Marlowe Sax Other Clinician: Referring Provider: Treating Provider/Extender: Grier Rocher, Dinah Weeks in Treatment: 3 Diagnosis Coding ICD-10 Codes Code Description [phone] Non-pressure chronic ulcer of other part of left lower leg limited to breakdown of skin I87.312 Chronic venous hypertension (idiopathic) with ulcer of left lower extremity [id number] Chronic diastolic (congestive) heart failure I10 Essential (primary) hypertension Z85.3 Personal history of malignant neoplasm of breast Facility Procedures : CPT4 Code: [id number] Description: 99214 - WOUND CARE VISIT-LEV 4 EST PT Modifier: Quantity: 1 Physician Procedures : CPT4 Code Description Modifier [id number] - WC PHYS LEVEL 3 - EST PT Heather [name], Heather [name] ([id number]) [id number] ICD-10 Diagnosis Description L97.821 Non-pressure chronic ulcer of other part of left lower leg limited to  breakdown of skin I87.312 Chronic venous hypertension (idiopathic) with ulcer of left lower extremity [id number] Chronic diastolic (congestive) heart failure I10 Essential (primary) hypertension Quantity: 1 7.pdf Page 7 of 7 Electronic Signature(s) Signed: 01/30/2023 3:54:15 PM By: Kalman Shan DO Entered By: Kalman Shan on 01/30/2023 14:33:15

## 2023-02-02 DIAGNOSIS — G301 Alzheimer's disease with late onset: Secondary | ICD-10-CM | POA: Diagnosis not present

## 2023-02-02 DIAGNOSIS — K219 Gastro-esophageal reflux disease without esophagitis: Secondary | ICD-10-CM | POA: Diagnosis not present

## 2023-02-02 DIAGNOSIS — G894 Chronic pain syndrome: Secondary | ICD-10-CM | POA: Diagnosis not present

## 2023-02-02 DIAGNOSIS — I4510 Unspecified right bundle-branch block: Secondary | ICD-10-CM | POA: Diagnosis not present

## 2023-02-02 DIAGNOSIS — K5900 Constipation, unspecified: Secondary | ICD-10-CM | POA: Diagnosis not present

## 2023-02-02 DIAGNOSIS — K449 Diaphragmatic hernia without obstruction or gangrene: Secondary | ICD-10-CM | POA: Diagnosis not present

## 2023-02-02 DIAGNOSIS — M810 Age-related osteoporosis without current pathological fracture: Secondary | ICD-10-CM | POA: Diagnosis not present

## 2023-02-02 DIAGNOSIS — F0284 Dementia in other diseases classified elsewhere, unspecified severity, with anxiety: Secondary | ICD-10-CM | POA: Diagnosis not present

## 2023-02-02 DIAGNOSIS — I110 Hypertensive heart disease with heart failure: Secondary | ICD-10-CM | POA: Diagnosis not present

## 2023-02-02 DIAGNOSIS — I5032 Chronic diastolic (congestive) heart failure: Secondary | ICD-10-CM | POA: Diagnosis not present

## 2023-02-02 DIAGNOSIS — K5730 Diverticulosis of large intestine without perforation or abscess without bleeding: Secondary | ICD-10-CM | POA: Diagnosis not present

## 2023-02-02 DIAGNOSIS — M47812 Spondylosis without myelopathy or radiculopathy, cervical region: Secondary | ICD-10-CM | POA: Diagnosis not present

## 2023-02-02 DIAGNOSIS — I872 Venous insufficiency (chronic) (peripheral): Secondary | ICD-10-CM | POA: Diagnosis not present

## 2023-02-02 DIAGNOSIS — L97821 Non-pressure chronic ulcer of other part of left lower leg limited to breakdown of skin: Secondary | ICD-10-CM | POA: Diagnosis not present

## 2023-02-02 DIAGNOSIS — F411 Generalized anxiety disorder: Secondary | ICD-10-CM | POA: Diagnosis not present

## 2023-02-02 DIAGNOSIS — J309 Allergic rhinitis, unspecified: Secondary | ICD-10-CM | POA: Diagnosis not present

## 2023-02-05 DIAGNOSIS — M810 Age-related osteoporosis without current pathological fracture: Secondary | ICD-10-CM | POA: Diagnosis not present

## 2023-02-05 DIAGNOSIS — K449 Diaphragmatic hernia without obstruction or gangrene: Secondary | ICD-10-CM | POA: Diagnosis not present

## 2023-02-05 DIAGNOSIS — I872 Venous insufficiency (chronic) (peripheral): Secondary | ICD-10-CM | POA: Diagnosis not present

## 2023-02-05 DIAGNOSIS — K5730 Diverticulosis of large intestine without perforation or abscess without bleeding: Secondary | ICD-10-CM | POA: Diagnosis not present

## 2023-02-05 DIAGNOSIS — I110 Hypertensive heart disease with heart failure: Secondary | ICD-10-CM | POA: Diagnosis not present

## 2023-02-05 DIAGNOSIS — I5032 Chronic diastolic (congestive) heart failure: Secondary | ICD-10-CM | POA: Diagnosis not present

## 2023-02-05 DIAGNOSIS — F411 Generalized anxiety disorder: Secondary | ICD-10-CM | POA: Diagnosis not present

## 2023-02-05 DIAGNOSIS — I4510 Unspecified right bundle-branch block: Secondary | ICD-10-CM | POA: Diagnosis not present

## 2023-02-05 DIAGNOSIS — J309 Allergic rhinitis, unspecified: Secondary | ICD-10-CM | POA: Diagnosis not present

## 2023-02-05 DIAGNOSIS — G894 Chronic pain syndrome: Secondary | ICD-10-CM | POA: Diagnosis not present

## 2023-02-05 DIAGNOSIS — K5900 Constipation, unspecified: Secondary | ICD-10-CM | POA: Diagnosis not present

## 2023-02-05 DIAGNOSIS — K219 Gastro-esophageal reflux disease without esophagitis: Secondary | ICD-10-CM | POA: Diagnosis not present

## 2023-02-05 DIAGNOSIS — M47812 Spondylosis without myelopathy or radiculopathy, cervical region: Secondary | ICD-10-CM | POA: Diagnosis not present

## 2023-02-05 DIAGNOSIS — F0284 Dementia in other diseases classified elsewhere, unspecified severity, with anxiety: Secondary | ICD-10-CM | POA: Diagnosis not present

## 2023-02-05 DIAGNOSIS — L97821 Non-pressure chronic ulcer of other part of left lower leg limited to breakdown of skin: Secondary | ICD-10-CM | POA: Diagnosis not present

## 2023-02-05 DIAGNOSIS — G301 Alzheimer's disease with late onset: Secondary | ICD-10-CM | POA: Diagnosis not present

## 2023-02-06 ENCOUNTER — Encounter (HOSPITAL_BASED_OUTPATIENT_CLINIC_OR_DEPARTMENT_OTHER): Payer: Medicare Other | Admitting: Internal Medicine

## 2023-02-06 DIAGNOSIS — I87312 Chronic venous hypertension (idiopathic) with ulcer of left lower extremity: Secondary | ICD-10-CM | POA: Diagnosis not present

## 2023-02-06 DIAGNOSIS — L97821 Non-pressure chronic ulcer of other part of left lower leg limited to breakdown of skin: Secondary | ICD-10-CM

## 2023-02-06 DIAGNOSIS — I110 Hypertensive heart disease with heart failure: Secondary | ICD-10-CM | POA: Diagnosis not present

## 2023-02-06 DIAGNOSIS — I5032 Chronic diastolic (congestive) heart failure: Secondary | ICD-10-CM | POA: Diagnosis not present

## 2023-02-06 DIAGNOSIS — Z853 Personal history of malignant neoplasm of breast: Secondary | ICD-10-CM | POA: Diagnosis not present

## 2023-02-06 DIAGNOSIS — F0390 Unspecified dementia without behavioral disturbance: Secondary | ICD-10-CM | POA: Diagnosis not present

## 2023-02-07 NOTE — Progress Notes (Signed)
SHAELY, PROPER ([id number]) [id number] Page 1 of 7 Visit Report for 02/06/2023 Arrival Information Details Patient Name: Date of Service: Heather Heather [name] 02/06/2023 10:15 A M Medical Record Number: [id number] Patient Account Number: [id number] Date of Birth/Sex: Treating RN: May 11, 1918 (89 y.o. Heather [name], Heather [name] Primary Care Adelis Docter: Marlowe Sax Other Clinician: Referring Chan Sheahan: Treating Daeton Kluth/Extender: Kalman [name] [name], Heather [name] in Treatment: 4 Visit Information History Since Last Visit Added or deleted any medications: No Patient Arrived: Wheel Chair Any new allergies or adverse reactions: No Arrival Time: 10:25 Had a fall or experienced change in No Accompanied By: son activities of daily living that may affect Transfer Assistance: Manual risk of falls: Patient Identification Verified: Yes Signs or symptoms of abuse/neglect since last visito No Secondary Verification Process Completed: Yes Hospitalized since last visit: No Patient Requires Transmission-Based Precautions: No Implantable device outside of the clinic excluding No Patient Has Alerts: Yes cellular tissue based products placed in the center Patient Alerts: ABI's:L:1.08 10/23 since last visit: Has Compression in Place as Prescribed: Yes Pain Present Now: Yes Electronic Signature(s) Signed: 02/06/2023 11:12:19 AM By: Rhae Hammock RN Entered By: Rhae Hammock on 02/06/2023 10:25:40 -------------------------------------------------------------------------------- Compression Therapy Details Patient Name: Date of Service: Heather [name], Heather [place]. 02/06/2023 10:15 A M Medical Record Number: [id number] Patient Account Number: [id number] Date of Birth/Sex: Treating RN: 1917-12-13 (89 y.o. Heather [name] Primary Care Shanta Hartner: Marlowe Sax Other Clinician: Referring Heather [name]: Treating Myldred Raju/Extender: Kalman [name] [name], Heather [name] in  Treatment: 4 Compression Therapy Performed for Wound Assessment: Wound #2 Left,Circumferential Lower Leg Performed By: Clinician Rhae Hammock, RN Compression Type: Rolena Infante Post Procedure Diagnosis Same as Pre-procedure Electronic Signature(s) Signed: 02/06/2023 11:12:19 AM By: Rhae Hammock RN Entered By: Rhae Hammock on 02/06/2023 10:51:45 -------------------------------------------------------------------------------- Encounter Discharge Information Details Patient Name: Date of Service: Heather [name], Heather [place]. 02/06/2023 10:15 A M Medical Record Number: [id number] Patient Account Number: [id number] Date of Birth/Sex: Treating RN: 06-09-1918 (89 y.o. Heather [name] Primary Care Jeanny Rymer: Marlowe Sax Other Clinician: Referring Heather [name]: Treating Herbie Lehrmann/Extender: Kalman [name] [name], Heather [name] in Treatment: 4 Encounter Discharge Information Items Post Procedure Vitals Discharge Condition: Stable Temperature (F): 98.7 Ambulatory Status: Wheelchair Pulse (bpm): 74 Discharge Destination: Home Respiratory Rate (breaths/min): 17 Transportation: Private Auto Blood Pressure (mmHg): 120/80 Accompanied By: self [name] ([id number].pdf Page 2 of 7 Schedule Follow-up Appointment: Yes Clinical Summary of Care: Patient Declined Electronic Signature(s) Signed: 02/06/2023 11:12:19 AM By: Rhae Hammock RN Entered By: Rhae Hammock on 02/06/2023 11:10:32 -------------------------------------------------------------------------------- Lower Extremity Assessment Details Patient Name: Date of Service: Heather [name], Heather [place]. 02/06/2023 10:15 A M Medical Record Number: [id number] Patient Account Number: [id number] Date of Birth/Sex: Treating RN: 1918-11-01 (89 y.o. Heather [name], Heather [name] Primary Care Solly Derasmo: Marlowe Sax Other Clinician: Referring [place] Warriner: Treating Mystic Labo/Extender: Kalman [name] [name],  Heather [name] in Treatment: 4 Edema Assessment Assessed: [Left: Yes] [Right: No] Edema: [Left: Ye] [Right: s] Calf Left: Right: Point of Measurement: 32 cm From Medial Instep 30 cm Ankle Left: Right: Point of Measurement: 10 cm From Medial Instep 25 cm Vascular Assessment Pulses: Dorsalis Pedis Palpable: [Left:Yes] Posterior Tibial Palpable: [Left:Yes] Electronic Signature(s) Signed: 02/06/2023 11:12:19 AM By: Rhae Hammock RN Entered By: Rhae Hammock on 02/06/2023 10:29:27 -------------------------------------------------------------------------------- Multi Wound Chart Details Patient Name: Date of Service: Heather [name], Heather Wallace J. 02/06/2023 10:15 A M Medical Record Number: [id number] Patient Account Number: [id number] Date of Birth/Sex: Treating RN: 1918/01/05 (89 y.o. F) Primary Care Laneshia Pina: Marlowe Sax Other Clinician: Referring Heather [name]:  Treating Stormy Sabol/Extender: Kalman [name] [name], Heather [name] in Treatment: 4 Vital Signs Height(in): Pulse(bpm): 73 Weight(lbs): Blood Pressure(mmHg): 112/65 Body Mass Index(BMI): Temperature(F): 97.7 Respiratory Rate(breaths/min): 17 [2:Photos:] [N/A:N/A] Left, Circumferential Lower Leg N/A N/A Wound Location: Gradually Appeared N/A N/A Wounding Event: Venous Leg Ulcer N/A N/A Primary Etiology: Hypertension, Peripheral Venous N/A N/A Comorbid History: Disease 01/09/2023 N/A N/A Date Acquired: 4 N/A N/A [name] of Treatment: Open N/A N/A Wound Status: No N/A N/A Wound Recurrence: 23x25x0.1 N/A N/A Measurements L x W x D (cm) 451.604 N/A N/A A (cm) : rea 45.16 N/A N/A Volume (cm) : -40.90% N/A N/A % Reduction in A rea: -40.90% N/A N/A % Reduction in Volume: Full Thickness Without Exposed N/A N/A Classification: Support Structures Medium N/A N/A Exudate A mount: Serosanguineous N/A N/A Exudate Type: red, brown N/A N/A Exudate Color: Distinct, outline attached N/A N/A Wound Margin: Large  (67-100%) N/A N/A Granulation A mount: Red, Pink N/A N/A Granulation Quality: Fascia: No N/A N/A Exposed Structures: Fat Layer (Subcutaneous Tissue): No Tendon: No Muscle: No Joint: No Bone: No Small (1-33%) N/A N/A Epithelialization: Debridement - Selective/Open Wound N/A N/A Debridement: Pre-procedure Verification/Time Out 10:52 N/A N/A Taken: Lidocaine N/A N/A Pain Control: Skin/Epidermis N/A N/A Level: 35 N/A N/A Debridement A (sq cm): rea Curette N/A N/A Instrument: Minimum N/A N/A Bleeding: Pressure N/A N/A Hemostasis A chieved: 0 N/A N/A Procedural Pain: 0 N/A N/A Post Procedural Pain: Procedure was tolerated well N/A N/A Debridement Treatment Response: 23x25x0.1 N/A N/A Post Debridement Measurements L x W x D (cm) 45.16 N/A N/A Post Debridement Volume: (cm) Excoriation: No N/A N/A Periwound Skin Texture: Induration: No Callus: No Crepitus: No Rash: No Scarring: No Maceration: Yes N/A N/A Periwound Skin Moisture: Dry/Scaly: No Hemosiderin Staining: Yes N/A N/A Periwound Skin Color: Atrophie Blanche: No Cyanosis: No Ecchymosis: No Erythema: No Mottled: No Pallor: No Rubor: No No Abnormality N/A N/A Temperature: Yes N/A N/A Tenderness on Palpation: Compression Therapy N/A N/A Procedures Performed: Debridement Treatment Notes Electronic Signature(s) Signed: 02/06/2023 11:11:47 AM By: Kalman Shan Heather Entered By: Kalman [name] on 02/06/2023 11:07:14 -------------------------------------------------------------------------------- Multi-Disciplinary Care Plan Details Patient Name: Date of Service: Heather [name], Heather Strang J. 02/06/2023 10:15 A M Medical Record Number: [id number] Patient Account Number: [id number] Date of Birth/Sex: Treating RN: 1918-01-29 (89 y.o. [address], Sherrie George, [name] ([id number]) [id number] Page 4 of 7 Primary Care Rae Sutcliffe: Marlowe Sax Other Clinician: Referring  Ariaunna Longsworth: Treating Heather [name] [name], Heather [name] in Treatment: 4 Active Inactive Wound/Skin Impairment Nursing Diagnoses: Impaired tissue integrity Knowledge deficit related to ulceration/compromised skin integrity Goals: Patient will have a decrease in wound volume by X% from date: (specify in notes) Date Initiated: 01/09/2023 Target Resolution Date: 03/20/2023 Goal Status: Active Patient/caregiver will verbalize understanding of skin care regimen Date Initiated: 01/09/2023 Target Resolution Date: 03/13/2023 Goal Status: Active Ulcer/skin breakdown will have a volume reduction of 30% by week 4 Date Initiated: 01/09/2023 Target Resolution Date: 02/14/2023 Goal Status: Active Interventions: Assess patient/caregiver ability to obtain necessary supplies Assess patient/caregiver ability to perform ulcer/skin care regimen upon admission and as needed Assess ulceration(s) every visit Notes: Electronic Signature(s) Signed: 02/06/2023 11:12:19 AM By: Rhae Hammock RN Entered By: Rhae Hammock on 02/06/2023 10:37:03 -------------------------------------------------------------------------------- Pain Assessment Details Patient Name: Date of Service: Heather [name], Heather [place]. 02/06/2023 10:15 A M Medical Record Number: [id number] Patient Account Number: [id number] Date of Birth/Sex: Treating RN: [birth year] (89 y.o. Heather [name] Primary Care Nikesh Teschner: Marlowe Sax Other Clinician: Referring Bonne Whack: Treating Corinda Ammon/Extender: Kalman [name]  [name], Heather [name] in Treatment: 4 Active Problems Location of Pain Severity and Description of Pain Patient Has Paino Yes Site Locations Pain Location: Generalized Pain, Pain in Ulcers With Dressing Change: Yes Duration of the Pain. Constant / Intermittento Intermittent Rate the pain. Current Pain Level: 7 Worst Pain Level: 10 Least Pain Level: 0 Tolerable Pain Level: 7 Character of Pain Describe  the Pain: Aching Pain Management and Medication AERIELLE, WEINHOLD ([id number]) [phone].pdf Page 5 of 7 Current Pain Management: Medication: No Cold Application: No Rest: No Massage: No Activity: No T.E.N.S.: No Heat Application: No Leg drop or elevation: No Is the Current Pain Management Adequate: Adequate How does your wound impact your activities of daily livingo Sleep: No Bathing: No Appetite: No Relationship With Others: No Bladder Continence: No Emotions: No Bowel Continence: No Work: No Toileting: No Drive: No Dressing: No Hobbies: No Electronic Signature(s) Signed: 02/06/2023 11:12:19 AM By: Rhae Hammock RN Entered By: Rhae Hammock on 02/06/2023 10:26:25 -------------------------------------------------------------------------------- Patient/Caregiver Education Details Patient Name: Date of Service: Heather [name], Heather RETHEA J. 3/22/2024andnbsp10:15 A M Medical Record Number: [id number] Patient Account Number: [id number] Date of Birth/Gender: Treating RN: 1917/11/26 (89 y.o. Heather [name] Primary Care Physician: Marlowe Sax Other Clinician: Referring Physician: Treating Physician/Extender: Heather [name] in Treatment: 4 Education Assessment Education Provided To: Patient Education Topics Provided Wound/Skin Impairment: Methods: Explain/Verbal Responses: Reinforcements needed, State content correctly Electronic Signature(s) Signed: 02/06/2023 11:12:19 AM By: Rhae Hammock RN Entered By: Rhae Hammock on 02/06/2023 10:37:17 -------------------------------------------------------------------------------- Wound Assessment Details Patient Name: Date of Service: Heather [name], Heather [place]. 02/06/2023 10:15 A M Medical Record Number: [id number] Patient Account Number: [id number] Date of Birth/Sex: Treating RN: 1918-02-09 (89 y.o. Heather [name] Primary Care Aayla Marrocco: Marlowe Sax Other  Clinician: Referring Marrian Bells: Treating Madelon Welsch/Extender: Kalman [name] [name], Heather [name] in Treatment: 4 Wound Status Wound Number: 2 Primary Etiology: Venous Leg Ulcer Wound Location: Left, Circumferential Lower Leg Wound Status: Open Wounding Event: Gradually Appeared Comorbid History: Hypertension, Peripheral Venous Disease Date Acquired: 01/09/2023 [name] Of Treatment: 4 Clustered Wound: No Photos LATRICE, BEVENS ([id number]) [phone].pdf Page 6 of 7 Wound Measurements Length: (cm) 23 Width: (cm) 25 Depth: (cm) 0.1 Area: (cm) 451.604 Volume: (cm) 45.16 % Reduction in Area: -40.9% % Reduction in Volume: -40.9% Epithelialization: Small (1-33%) Wound Description Classification: Full Thickness Without Exposed Support Structures Wound Margin: Distinct, outline attached Exudate Amount: Medium Exudate Type: Serosanguineous Exudate Color: red, brown Foul Odor After Cleansing: No Slough/Fibrino No Wound Bed Granulation Amount: Large (67-100%) Exposed Structure Granulation Quality: Red, Pink Fascia Exposed: No Fat Layer (Subcutaneous Tissue) Exposed: No Tendon Exposed: No Muscle Exposed: No Joint Exposed: No Bone Exposed: No Periwound Skin Texture Texture Color No Abnormalities Noted: No No Abnormalities Noted: No Callus: No Atrophie Blanche: No Crepitus: No Cyanosis: No Excoriation: No Ecchymosis: No Induration: No Erythema: No Rash: No Hemosiderin Staining: Yes Scarring: No Mottled: No Pallor: No Moisture Rubor: No No Abnormalities Noted: No Dry / Scaly: No Temperature / Pain Maceration: Yes Temperature: No Abnormality Tenderness on Palpation: Yes Treatment Notes Wound #2 (Lower Leg) Wound Laterality: Left, Circumferential Cleanser Soap and Water Discharge Instruction: May shower and wash wound with dial antibacterial soap and water prior to dressing change. Peri-Wound Care Triamcinolone 15 (g) Discharge  Instruction: Use triamcinolone cream IN CLINIC ONLY. Topical Keystone antifungal spray Discharge Instruction: apply over the counter antifungal spray at home to the toes. Ketoconazole in cilnic. Primary Dressing Sorbalgon AG Dressing, 4x4 (in/in) Discharge Instruction: Apply foot, ankle, and  achilles area. Secondary Dressing ABD Pad, 5x9 CYPRESS, LEIS ([id number]) [phone].pdf Page 7 of 7 Discharge Instruction: Apply over primary dressing as directed. Secured With Compression Wrap zinc unna boot Compression Stockings Environmental education officer) Signed: 02/06/2023 11:12:19 AM By: Rhae Hammock RN Entered By: Rhae Hammock on 02/06/2023 10:31:55 -------------------------------------------------------------------------------- Vitals Details Patient Name: Date of Service: Heather [name], Heather [place] J. 02/06/2023 10:15 A M Medical Record Number: [id number] Patient Account Number: [id number] Date of Birth/Sex: Treating RN: September 24, 1918 (89 y.o. Heather [name] Primary Care Shenee Wignall: Marlowe Sax Other Clinician: Referring Daymein Nunnery: Treating Heather [name], Heather [name] in Treatment: 4 Vital Signs Time Taken: 10:25 Temperature (F): 97.7 Pulse (bpm): 73 Respiratory Rate (breaths/min): 17 Blood Pressure (mmHg): 112/65 Reference Range: 80 - 120 mg / dl Electronic Signature(s) Signed: 02/06/2023 11:12:19 AM By: Rhae Hammock RN Entered By: Rhae Hammock on 02/06/2023 10:30:14

## 2023-02-07 NOTE — Progress Notes (Signed)
Heather, [name] ([id number]) [id number] Page 1 of 8 Visit Report for 02/06/2023 Chief Complaint Document Details Patient Name: Date of Service: HA Heather [name] 02/06/2023 10:15 A M Medical Record Number: [id number] Patient Account Number: [id number] Date of Birth/Sex: Treating RN: 20-Jun-1918 (89 y.o. F) Primary Care Provider: Marlowe Sax Other Clinician: Referring Provider: Treating Provider/Extender: Grier Rocher, Dinah Weeks in Treatment: 4 Information Obtained from: Patient Chief Complaint 09/08/2022; left lower extremity wound 01/09/2023; left lower extremity areas of skin breakdown Electronic Signature(s) Signed: 02/06/2023 11:11:47 AM By: Kalman Shan DO Entered By: Kalman Shan on 02/06/2023 11:07:36 -------------------------------------------------------------------------------- Debridement Details Patient Name: Date of Service: [name], DO [place] 02/06/2023 10:15 A M Medical Record Number: [id number] Patient Account Number: [id number] Date of Birth/Sex: Treating RN: May 24, 1918 (89 y.o. Heather [name], Heather [name] Primary Care Provider: Marlowe Sax Other Clinician: Referring Provider: Treating Provider/Extender: Grier Rocher, Dinah Weeks in Treatment: 4 Debridement Performed for Assessment: Wound #2 Left,Circumferential Lower Leg Performed By: Physician Kalman Shan, DO Debridement Type: Debridement Severity of Tissue Pre Debridement: Limited to breakdown of skin Level of Consciousness (Pre-procedure): Awake and Alert Pre-procedure Verification/Time Out Yes - 10:52 Taken: Start Time: 10:52 Pain Control: Lidocaine T Area Debrided (L x W): otal 7 (cm) x 5 (cm) = 35 (cm) Tissue and other material debrided: Viable, Non-Viable, Skin: Dermis , Skin: Epidermis Level: Skin/Epidermis Debridement Description: Selective/Open Wound Instrument: Curette Bleeding: Minimum Hemostasis Achieved: Pressure End  Time: 10:52 Procedural Pain: 0 Post Procedural Pain: 0 Response to Treatment: Procedure was tolerated well Level of Consciousness (Post- Awake and Alert procedure): Post Debridement Measurements of Total Wound Length: (cm) 23 Width: (cm) 25 Depth: (cm) 0.1 Volume: (cm) 45.16 Character of Wound/Ulcer Post Debridement: Improved Severity of Tissue Post Debridement: Fat layer exposed Post Procedure Diagnosis Same as Pre-procedure Electronic Signature(s) Signed: 02/06/2023 11:11:47 AM By: Kalman Shan DO Signed: 02/06/2023 11:12:19 AM By: Rhae Hammock RN [name], [name] ([id number]) [id number] Page 2 of 8 Entered By: Rhae Hammock on 02/06/2023 10:52:44 -------------------------------------------------------------------------------- HPI Details Patient Name: Date of Service: HA Heather [name] 02/06/2023 10:15 A M Medical Record Number: [id number] Patient Account Number: [id number] Date of Birth/Sex: Treating RN: 1918/08/27 (89 y.o. F) Primary Care Provider: Marlowe Sax Other Clinician: Referring Provider: Treating Provider/Extender: Grier Rocher, Dinah Weeks in Treatment: 4 History of Present Illness HPI Description: Admission 09/08/2022 [name] is a 89 year old female with a past medical history of venous insufficiency, chronic diastolic heart failure, dementia, and breast cancer that presents to the clinic for a 14-month history of nonhealing ulcer to the left lower extremity. She states over the past 2 months the site has become more painful And is weeping more. She visited her primary care office on 10/17 and was prescribed doxycycline for cellulitis of the left lower extremity. It is unclear if this has helped her symptoms. She has home health and they have been using Medihoney under compression therapy. They come out twice weekly. Her ABIs in office were 1.08. Currently she denies systemic signs of  infection. 10/31; patient presents for follow-up. Patient grew high levels of Pseudomonas aeruginosa, Staphylococcus aureus and Enterococcus bacillus from pcr culture at last clinic visit. Keystone antibiotic ointment has been ordered for the patient, she has not yet received this. She has tolerated the wrap well. 11/14; patient presents for follow-up. She started using Keystone antibiotic spray over the past 1 to 2 weeks. She has reported significant improvement in wound healing. She denies any pain. She has a  small wound remaining to the dorsal aspect of her foot. 11/21; patient presents for follow-up. At last clinic visit we have been using [facility] antibiotic spray with silver alginate under Kerlix/Coban. She states she ordered her compression stockings and they are in the mail. Her wounds have healed. 01/09/2023 [name] is a 89 year old female with a past medical history of venous insufficiency, chronic diastolic heart failure, dementia and breast cancer that presents the clinic for a 2-week history of skin breakdown to the left lower extremity. She was seen in our clinic several months ago for this issue. She states that home health is coming out twice a week and wrapping her legs. She currently denies signs of infection. She does not want to wear compression stockings. 3/8; patient presents for follow-up. She has been using Keystone antibiotic spray with silver alginate under Kerlix/Coban. She states that home health has been coming out. She has no issues or complaints today. 3/15; patient presents for follow-up. We have been using Keystone antibiotic spray with silver alginate and antifungal spray under Kerlix/Coban. She has no issues or complaints today. Area appears improved since last clinic visit with less maceration. 3/22; patient presents for follow-up. We have been using silver alginate with antifungal spray and antibiotic spray under Kerlix/Coban. Patient has no issues  or complaints today. Home health changes the dressings. Electronic Signature(s) Signed: 02/06/2023 11:11:47 AM By: Kalman Shan DO Entered By: Kalman Shan on 02/06/2023 11:08:40 -------------------------------------------------------------------------------- Physical Exam Details Patient Name: Date of Service: [name], DO [place] 02/06/2023 10:15 A M Medical Record Number: [id number] Patient Account Number: [id number] Date of Birth/Sex: Treating RN: 09-08-1918 (89 y.o. F) Primary Care Provider: Marlowe Sax Other Clinician: Referring Provider: Treating Provider/Extender: Kalman Shan Ngetich, Dinah Weeks in Treatment: 4 Constitutional respirations regular, non-labored and within target range for patient.. Cardiovascular 2+ dorsalis pedis/posterior tibialis pulses. Psychiatric pleasant and cooperative. Notes Left lower extremity: Scattered areas of skin breakdown to the left foot. T the medial lateral aspect there is slough accumulation. No signs of acute active o infection. Electronic Signature(s) Signed: 02/06/2023 11:11:47 AM By: Alonna Minium ([id number]) By: Kalman Shan DO [phone].pdf Page 3 of 8 Signed: 02/06/2023 11:11:47 AM Entered By: Kalman Shan on 02/06/2023 11:09:30 -------------------------------------------------------------------------------- Physician Orders Details Patient Name: Date of Service: [place], DO [place] 02/06/2023 10:15 A M Medical Record Number: [id number] Patient Account Number: [id number] Date of Birth/Sex: Treating RN: [birth year] (89 y.o. [name] Primary Care Provider: Marlowe Sax Other Clinician: Referring Provider: Treating Provider/Extender: Kalman Shan Ngetich, Dinah Weeks in Treatment: 4 Verbal / Phone Orders: No Diagnosis Coding Follow-up Appointments ppointment in 1 week. - Dr. Heber [place] and Allayne Butcher Rm # 7 Friday 02/13/23 @ 9:00 Return  A Other: - Get some antifungal spray (over-the-counter) to use before the wrap goes on at each visit Bathing/ Shower/ Hygiene May shower with protection but do not get wound dressing(s) wet. Protect dressing(s) with water repellant cover (for example, large plastic bag) or a cast cover and may then take shower. Edema Control - Lymphedema / SCD / Other Elevate legs to the level of the heart or above for 30 minutes daily and/or when sitting for 3-4 times a day throughout the day. Avoid standing for long periods of time. Home Health New wound care orders this week; continue Home Health for wound care. May utilize formulary equivalent dressing for wound treatment orders unless otherwise specified. - Antifungal spray to toes, Keystone, Calcium Alginate Ag to foot, ankle, and achilles  area, abd pad, unna boot. Home health to change 2x a week. apply gauze between toes as well. Other Home Health Orders/Instructions: - advance home [facility] Wound Treatment Wound #2 - Lower Leg Wound Laterality: Left, Circumferential Cleanser: Soap and Water (Home Health) 2 x Per Week/15 Days Discharge Instructions: May shower and wash wound with dial antibacterial soap and water prior to dressing change. Peri-Wound Care: Triamcinolone 15 (g) 2 x Per Week/15 Days Discharge Instructions: Use triamcinolone cream IN CLINIC ONLY. Topical: Keystone ([facility]) 2 x Per Week/15 Days Topical: antifungal spray (Home Health) 2 x Per Week/15 Days Discharge Instructions: apply over the counter antifungal spray at home to the toes. Ketoconazole in cilnic. Prim Dressing: Sorbalgon AG Dressing, 4x4 (in/in) (Home Health) 2 x Per Week/15 Days ary Discharge Instructions: Apply foot, ankle, and achilles area. Secondary Dressing: ABD Pad, 5x9 (Home Health) 2 x Per Week/15 Days Discharge Instructions: Apply over primary dressing as directed. Compression Wrap: zinc unna boot (Home Health) 2 x Per Week/15 Days Electronic  Signature(s) Signed: 02/06/2023 11:11:47 AM By: Kalman Shan DO Entered By: Kalman Shan on 02/06/2023 11:09:41 -------------------------------------------------------------------------------- Problem List Details Patient Name: Date of Service: [name], DO [place] 02/06/2023 10:15 A M Medical Record Number: [id number] Patient Account Number: [id number] Date of Birth/Sex: Treating RN: [birth year] (89 y.o. F) Primary Care Provider: Marlowe Sax Other Clinician: Referring Provider: Treating Provider/Extender: Grier Rocher, Dinah Weeks in Treatment: [address] JANAYAH, SEIPLE ([id number]) [id number] Page 4 of 8 ICD-10 Encounter Code Description Active Date MDM Diagnosis L97.821 Non-pressure chronic ulcer of other part of left lower leg limited to breakdown 01/09/2023 No Yes of skin I87.312 Chronic venous hypertension (idiopathic) with ulcer of left lower extremity 01/09/2023 No Yes [id number] Chronic diastolic (congestive) heart failure 01/09/2023 No Yes I10 Essential (primary) hypertension 01/09/2023 No Yes Z85.3 Personal history of malignant neoplasm of breast 01/09/2023 No Yes Inactive Problems Resolved Problems Electronic Signature(s) Signed: 02/06/2023 11:11:47 AM By: Kalman Shan DO Entered By: Kalman Shan on 02/06/2023 11:07:09 -------------------------------------------------------------------------------- Progress Note Details Patient Name: Date of Service: [name], DO [place]. 02/06/2023 10:15 A M Medical Record Number: [id number] Patient Account Number: [id number] Date of Birth/Sex: Treating RN: 1918/09/21 (89 y.o. F) Primary Care Provider: Marlowe Sax Other Clinician: Referring Provider: Treating Provider/Extender: Grier Rocher, Dinah Weeks in Treatment: 4 Subjective Chief Complaint Information obtained from Patient 09/08/2022; left lower extremity wound 01/09/2023; left lower extremity areas  of skin breakdown History of Present Illness (HPI) Admission 09/08/2022 Mr. Anona Bree is a 89 year old female with a past medical history of venous insufficiency, chronic diastolic heart failure, dementia, and breast cancer that presents to the clinic for a 53-month history of nonhealing ulcer to the left lower extremity. She states over the past 2 months the site has become more painful And is weeping more. She visited her primary care office on 10/17 and was prescribed doxycycline for cellulitis of the left lower extremity. It is unclear if this has helped her symptoms. She has home health and they have been using Medihoney under compression therapy. They come out twice weekly. Her ABIs in office were 1.08. Currently she denies systemic signs of infection. 10/31; patient presents for follow-up. Patient grew high levels of Pseudomonas aeruginosa, Staphylococcus aureus and Enterococcus bacillus from pcr culture at last clinic visit. Keystone antibiotic ointment has been ordered for the patient, she has not yet received this. She has tolerated the wrap well. 11/14; patient presents for follow-up. She started using Keystone antibiotic spray over  the past 1 to 2 weeks. She has reported significant improvement in wound healing. She denies any pain. She has a small wound remaining to the dorsal aspect of her foot. 11/21; patient presents for follow-up. At last clinic visit we have been using [facility] antibiotic spray with silver alginate under Kerlix/Coban. She states she ordered her compression stockings and they are in the mail. Her wounds have healed. 01/09/2023 [name] is a 89 year old female with a past medical history of venous insufficiency, chronic diastolic heart failure, dementia and breast cancer that presents the clinic for a 2-week history of skin breakdown to the left lower extremity. She was seen in our clinic several months ago for this issue. She states that home health  is coming out twice a week and wrapping her legs. She currently denies signs of infection. She does not want to wear compression stockings. 3/8; patient presents for follow-up. She has been using Keystone antibiotic spray with silver alginate under Kerlix/Coban. She states that home health has been coming out. She has no issues or complaints today. 3/15; patient presents for follow-up. We have been using Keystone antibiotic spray with silver alginate and antifungal spray under Kerlix/Coban. She has no BEYA, ZILLMER ([id number]) [id number] Page 5 of 8 issues or complaints today. Area appears improved since last clinic visit with less maceration. 3/22; patient presents for follow-up. We have been using silver alginate with antifungal spray and antibiotic spray under Kerlix/Coban. Patient has no issues or complaints today. Home health changes the dressings. Patient History Social History Never smoker, Marital Status - Widowed, Alcohol Use - Never, Drug Use - No History, Caffeine Use - Never. Medical History Cardiovascular Patient has history of Hypertension, Peripheral Venous Disease Hospitalization/Surgery History - left mastectomy. Medical A Surgical History Notes nd Gastrointestinal laryngopharyngeal reflux disease diverticulosis Musculoskeletal osteoporosis carpel tunnel cervical spondylosis DDD Objective Constitutional respirations regular, non-labored and within target range for patient.. Vitals Time Taken: 10:25 AM, Temperature: 97.7 F, Pulse: 73 bpm, Respiratory Rate: 17 breaths/min, Blood Pressure: 112/65 mmHg. Cardiovascular 2+ dorsalis pedis/posterior tibialis pulses. Psychiatric pleasant and cooperative. General Notes: Left lower extremity: Scattered areas of skin breakdown to the left foot. T the medial lateral aspect there is slough accumulation. No signs of o acute active infection. Integumentary (Hair, Skin) Wound #2 status is Open.  Original cause of wound was Gradually Appeared. The date acquired was: 01/09/2023. The wound has been in treatment 4 weeks. The wound is located on the Left,Circumferential Lower Leg. The wound measures 23cm length x 25cm width x 0.1cm depth; 451.604cm^2 area and 45.16cm^3 volume. There is a medium amount of serosanguineous drainage noted. The wound margin is distinct with the outline attached to the wound base. There is large (67-100%) red, pink granulation within the wound bed. The periwound skin appearance exhibited: Maceration, Hemosiderin Staining. The periwound skin appearance did not exhibit: Callus, Crepitus, Excoriation, Induration, Rash, Scarring, Dry/Scaly, Atrophie Blanche, Cyanosis, Ecchymosis, Mottled, Pallor, Rubor, Erythema. Periwound temperature was noted as No Abnormality. The periwound has tenderness on palpation. Assessment Active Problems ICD-10 Non-pressure chronic ulcer of other part of left lower leg limited to breakdown of skin Chronic venous hypertension (idiopathic) with ulcer of left lower extremity Chronic diastolic (congestive) heart failure Essential (primary) hypertension Personal history of malignant neoplasm of breast Patient's wounds overall are stable. I debrided Slough accumulation. At this time I recommended a Zinc Unna boot to help with alleviating some skin irritation. Will continue antifungal spray, antibiotic spray and silver alginate to the areas that  are open. Follow-up in 1 week. Procedures Wound #2 Pre-procedure diagnosis of Wound #2 is a Venous Leg Ulcer located on the Left,Circumferential Lower Leg .Severity of Tissue Pre Debridement is: Limited to breakdown of skin. There was a Selective/Open Wound Skin/Epidermis Debridement with a total area of 35 sq cm performed by Kalman Shan, DO. With the following instrument(s): Curette to remove Viable and Non-Viable tissue/material. Material removed includes Skin: Dermis and Skin: Epidermis and  after achieving pain control using Lidocaine. No specimens were taken. A time out was conducted at 10:52, prior to the start of the procedure. A Minimum amount of VIKTORYA, LEIFHEIT ([id number]) [phone].pdf Page 6 of 8 bleeding was controlled with Pressure. The procedure was tolerated well with a pain level of 0 throughout and a pain level of 0 following the procedure. Post Debridement Measurements: 23cm length x 25cm width x 0.1cm depth; 45.16cm^3 volume. Character of Wound/Ulcer Post Debridement is improved. Severity of Tissue Post Debridement is: Fat layer exposed. Post procedure Diagnosis Wound #2: Same as Pre-Procedure Pre-procedure diagnosis of Wound #2 is a Venous Leg Ulcer located on the Left,Circumferential Lower Leg . There was a Haematologist Compression Therapy Procedure by Rhae Hammock, RN. Post procedure Diagnosis Wound #2: Same as Pre-Procedure Plan Follow-up Appointments: Return Appointment in 1 week. - Dr. Heber [place] and Allayne Butcher Rm # 7 Friday 02/13/23 @ 9:00 Other: - Get some antifungal spray (over-the-counter) to use before the wrap goes on at each visit Bathing/ Shower/ Hygiene: May shower with protection but do not get wound dressing(s) wet. Protect dressing(s) with water repellant cover (for example, large plastic bag) or a cast cover and may then take shower. Edema Control - Lymphedema / SCD / Other: Elevate legs to the level of the heart or above for 30 minutes daily and/or when sitting for 3-4 times a day throughout the day. Avoid standing for long periods of time. Home Health: New wound care orders this week; continue Home Health for wound care. May utilize formulary equivalent dressing for wound treatment orders unless otherwise specified. - Antifungal spray to toes, Keystone, Calcium Alginate Ag to foot, ankle, and achilles area, abd pad, unna boot. Home health to change 2x a week. apply gauze between toes as well. Other Home Health  Orders/Instructions: - advance home health WOUND #2: - Lower Leg Wound Laterality: Left, Circumferential Cleanser: Soap and Water (Home Health) 2 x Per Week/15 Days Discharge Instructions: May shower and wash wound with dial antibacterial soap and water prior to dressing change. Peri-Wound Care: Triamcinolone 15 (g) 2 x Per Week/15 Days Discharge Instructions: Use triamcinolone cream IN CLINIC ONLY. Topical: Keystone ([facility]) 2 x Per Week/15 Days Topical: antifungal spray (Home Health) 2 x Per Week/15 Days Discharge Instructions: apply over the counter antifungal spray at home to the toes. Ketoconazole in cilnic. Prim Dressing: Sorbalgon AG Dressing, 4x4 (in/in) (Home Health) 2 x Per Week/15 Days ary Discharge Instructions: Apply foot, ankle, and achilles area. Secondary Dressing: ABD Pad, 5x9 (Home Health) 2 x Per Week/15 Days Discharge Instructions: Apply over primary dressing as directed. Com pression Wrap: zinc unna boot (Home Health) 2 x Per Week/15 Days 1. In office sharp debridement 2. Antibiotic spray, antifungal spray, silver alginate, under Unna boot to the left lower extremity 3. Follow-up in 1 week Electronic Signature(s) Signed: 02/06/2023 11:11:47 AM By: Kalman Shan DO Entered By: Kalman Shan on 02/06/2023 11:11:13 -------------------------------------------------------------------------------- HxROS Details Patient Name: Date of Service: [name], DO [place] 02/06/2023 10:15 A M Medical Record Number:  [id number] Patient Account Number: [id number] Date of Birth/Sex: Treating RN: Dec 20, 1917 (89 y.o. F) Primary Care Provider: Marlowe Sax Other Clinician: Referring Provider: Treating Provider/Extender: Grier Rocher, Dinah Weeks in Treatment: 4 Cardiovascular Medical History: Positive for: Hypertension; Peripheral Venous Disease Gastrointestinal Medical History: Past Medical History Notes: laryngopharyngeal reflux  disease diverticulosis Musculoskeletal Medical HistoryMarland Kitchen DENASIA, WHISLER ([id number]) [id number] Page 7 of 8 Past Medical History Notes: osteoporosis carpel tunnel cervical spondylosis DDD Immunizations Pneumococcal Vaccine: Received Pneumococcal Vaccination: Yes Received Pneumococcal Vaccination On or After 60th Birthday: Yes Implantable Devices No devices added Hospitalization / Surgery History Type of Hospitalization/Surgery left mastectomy Family and Social History Never smoker; Marital Status - Widowed; Alcohol Use: Never; Drug Use: No History; Caffeine Use: Never; Financial Concerns: No; Food, Clothing or Shelter Needs: No; Support System Lacking: No Electronic Signature(s) Signed: 02/06/2023 11:11:47 AM By: Kalman Shan DO Entered By: Kalman Shan on 02/06/2023 11:08:45 -------------------------------------------------------------------------------- SuperBill Details Patient Name: Date of Service: [place], DO [place]. 02/06/2023 Medical Record Number: [id number] Patient Account Number: [id number] Date of Birth/Sex: Treating RN: 11-01-1918 (89 y.o. Heather [name], Heather [name] Primary Care Provider: Marlowe Sax Other Clinician: Referring Provider: Treating Provider/Extender: Grier Rocher, Dinah Weeks in Treatment: 4 Diagnosis Coding ICD-10 Codes Code Description [id number] Non-pressure chronic ulcer of other part of left lower leg limited to breakdown of skin I87.312 Chronic venous hypertension (idiopathic) with ulcer of left lower extremity [id number] Chronic diastolic (congestive) heart failure I10 Essential (primary) hypertension Z85.3 Personal history of malignant neoplasm of breast Facility Procedures : CPT4 Code: [id number] Description: [id number] - DEBRIDE WOUND 1ST 20 SQ CM OR < ICD-10 Diagnosis Description L97.821 Non-pressure chronic ulcer of other part of left lower leg limited to breakdown o I87.312 Chronic venous  hypertension (idiopathic) with ulcer of left lower  extremity Modifier: f skin Quantity: 1 : CPT4 Code: [id number] Description: [id number] - DEBRIDE WOUND EA ADDL 20 SQ CM ICD-10 Diagnosis Description L97.821 Non-pressure chronic ulcer of other part of left lower leg limited to breakdown o I87.312 Chronic venous hypertension (idiopathic) with ulcer of left lower extremity Modifier: f skin Quantity: 1 Physician Procedures : CPT4 Code Description Modifier [id number] - WC PHYS DEBR WO ANESTH 20 SQ CM ICD-10 Diagnosis Description L97.821 Non-pressure chronic ulcer of other part of left lower leg limited to breakdown of skin I87.312 Chronic venous hypertension (idiopathic)  with ulcer of left lower extremity Quantity: 1 : [id number] - WC PHYS DEBR WO ANESTH EA ADD 20 CM ANNALYCIA, Heather [name] ([id number]) [id number] ICD-10 Diagnosis Description L97.821 Non-pressure chronic ulcer of other part of left lower leg limited to breakdown of skin I87.312  Chronic venous hypertension (idiopathic) with ulcer of left lower extremity Quantity: 1 .pdf Page 8 of 8 Electronic Signature(s) Signed: 02/06/2023 11:11:47 AM By: Kalman Shan DO Entered By: Kalman Shan on 02/06/2023 11:11:28

## 2023-02-08 DIAGNOSIS — L97821 Non-pressure chronic ulcer of other part of left lower leg limited to breakdown of skin: Secondary | ICD-10-CM | POA: Diagnosis not present

## 2023-02-08 DIAGNOSIS — Z741 Need for assistance with personal care: Secondary | ICD-10-CM | POA: Diagnosis not present

## 2023-02-08 DIAGNOSIS — F411 Generalized anxiety disorder: Secondary | ICD-10-CM | POA: Diagnosis not present

## 2023-02-08 DIAGNOSIS — K5730 Diverticulosis of large intestine without perforation or abscess without bleeding: Secondary | ICD-10-CM | POA: Diagnosis not present

## 2023-02-08 DIAGNOSIS — Z9181 History of falling: Secondary | ICD-10-CM | POA: Diagnosis not present

## 2023-02-08 DIAGNOSIS — J309 Allergic rhinitis, unspecified: Secondary | ICD-10-CM | POA: Diagnosis not present

## 2023-02-08 DIAGNOSIS — G301 Alzheimer's disease with late onset: Secondary | ICD-10-CM | POA: Diagnosis not present

## 2023-02-08 DIAGNOSIS — I110 Hypertensive heart disease with heart failure: Secondary | ICD-10-CM | POA: Diagnosis not present

## 2023-02-08 DIAGNOSIS — Z556 Problems related to health literacy: Secondary | ICD-10-CM | POA: Diagnosis not present

## 2023-02-08 DIAGNOSIS — L97521 Non-pressure chronic ulcer of other part of left foot limited to breakdown of skin: Secondary | ICD-10-CM | POA: Diagnosis not present

## 2023-02-08 DIAGNOSIS — I87312 Chronic venous hypertension (idiopathic) with ulcer of left lower extremity: Secondary | ICD-10-CM | POA: Diagnosis not present

## 2023-02-08 DIAGNOSIS — F0284 Dementia in other diseases classified elsewhere, unspecified severity, with anxiety: Secondary | ICD-10-CM | POA: Diagnosis not present

## 2023-02-08 DIAGNOSIS — M810 Age-related osteoporosis without current pathological fracture: Secondary | ICD-10-CM | POA: Diagnosis not present

## 2023-02-08 DIAGNOSIS — M47812 Spondylosis without myelopathy or radiculopathy, cervical region: Secondary | ICD-10-CM | POA: Diagnosis not present

## 2023-02-08 DIAGNOSIS — K449 Diaphragmatic hernia without obstruction or gangrene: Secondary | ICD-10-CM | POA: Diagnosis not present

## 2023-02-08 DIAGNOSIS — H01002 Unspecified blepharitis right lower eyelid: Secondary | ICD-10-CM | POA: Diagnosis not present

## 2023-02-08 DIAGNOSIS — G894 Chronic pain syndrome: Secondary | ICD-10-CM | POA: Diagnosis not present

## 2023-02-08 DIAGNOSIS — K219 Gastro-esophageal reflux disease without esophagitis: Secondary | ICD-10-CM | POA: Diagnosis not present

## 2023-02-08 DIAGNOSIS — I4510 Unspecified right bundle-branch block: Secondary | ICD-10-CM | POA: Diagnosis not present

## 2023-02-08 DIAGNOSIS — Z853 Personal history of malignant neoplasm of breast: Secondary | ICD-10-CM | POA: Diagnosis not present

## 2023-02-08 DIAGNOSIS — K5900 Constipation, unspecified: Secondary | ICD-10-CM | POA: Diagnosis not present

## 2023-02-08 DIAGNOSIS — I5032 Chronic diastolic (congestive) heart failure: Secondary | ICD-10-CM | POA: Diagnosis not present

## 2023-02-08 DIAGNOSIS — H01005 Unspecified blepharitis left lower eyelid: Secondary | ICD-10-CM | POA: Diagnosis not present

## 2023-02-09 DIAGNOSIS — G301 Alzheimer's disease with late onset: Secondary | ICD-10-CM | POA: Diagnosis not present

## 2023-02-09 DIAGNOSIS — K5730 Diverticulosis of large intestine without perforation or abscess without bleeding: Secondary | ICD-10-CM | POA: Diagnosis not present

## 2023-02-09 DIAGNOSIS — K219 Gastro-esophageal reflux disease without esophagitis: Secondary | ICD-10-CM | POA: Diagnosis not present

## 2023-02-09 DIAGNOSIS — I4510 Unspecified right bundle-branch block: Secondary | ICD-10-CM | POA: Diagnosis not present

## 2023-02-09 DIAGNOSIS — I110 Hypertensive heart disease with heart failure: Secondary | ICD-10-CM | POA: Diagnosis not present

## 2023-02-09 DIAGNOSIS — M810 Age-related osteoporosis without current pathological fracture: Secondary | ICD-10-CM | POA: Diagnosis not present

## 2023-02-09 DIAGNOSIS — K5900 Constipation, unspecified: Secondary | ICD-10-CM | POA: Diagnosis not present

## 2023-02-09 DIAGNOSIS — F411 Generalized anxiety disorder: Secondary | ICD-10-CM | POA: Diagnosis not present

## 2023-02-09 DIAGNOSIS — I5032 Chronic diastolic (congestive) heart failure: Secondary | ICD-10-CM | POA: Diagnosis not present

## 2023-02-09 DIAGNOSIS — L97821 Non-pressure chronic ulcer of other part of left lower leg limited to breakdown of skin: Secondary | ICD-10-CM | POA: Diagnosis not present

## 2023-02-09 DIAGNOSIS — I87312 Chronic venous hypertension (idiopathic) with ulcer of left lower extremity: Secondary | ICD-10-CM | POA: Diagnosis not present

## 2023-02-09 DIAGNOSIS — L97521 Non-pressure chronic ulcer of other part of left foot limited to breakdown of skin: Secondary | ICD-10-CM | POA: Diagnosis not present

## 2023-02-09 DIAGNOSIS — G894 Chronic pain syndrome: Secondary | ICD-10-CM | POA: Diagnosis not present

## 2023-02-09 DIAGNOSIS — F0284 Dementia in other diseases classified elsewhere, unspecified severity, with anxiety: Secondary | ICD-10-CM | POA: Diagnosis not present

## 2023-02-09 DIAGNOSIS — M47812 Spondylosis without myelopathy or radiculopathy, cervical region: Secondary | ICD-10-CM | POA: Diagnosis not present

## 2023-02-09 DIAGNOSIS — J309 Allergic rhinitis, unspecified: Secondary | ICD-10-CM | POA: Diagnosis not present

## 2023-02-13 ENCOUNTER — Encounter (HOSPITAL_BASED_OUTPATIENT_CLINIC_OR_DEPARTMENT_OTHER): Payer: Medicare Other | Admitting: Internal Medicine

## 2023-02-13 DIAGNOSIS — L97821 Non-pressure chronic ulcer of other part of left lower leg limited to breakdown of skin: Secondary | ICD-10-CM | POA: Diagnosis not present

## 2023-02-13 DIAGNOSIS — Z853 Personal history of malignant neoplasm of breast: Secondary | ICD-10-CM | POA: Diagnosis not present

## 2023-02-13 DIAGNOSIS — F0390 Unspecified dementia without behavioral disturbance: Secondary | ICD-10-CM | POA: Diagnosis not present

## 2023-02-13 DIAGNOSIS — I5032 Chronic diastolic (congestive) heart failure: Secondary | ICD-10-CM | POA: Diagnosis not present

## 2023-02-13 DIAGNOSIS — I110 Hypertensive heart disease with heart failure: Secondary | ICD-10-CM | POA: Diagnosis not present

## 2023-02-13 DIAGNOSIS — I87312 Chronic venous hypertension (idiopathic) with ulcer of left lower extremity: Secondary | ICD-10-CM | POA: Diagnosis not present

## 2023-02-17 DIAGNOSIS — G894 Chronic pain syndrome: Secondary | ICD-10-CM | POA: Diagnosis not present

## 2023-02-17 DIAGNOSIS — F0284 Dementia in other diseases classified elsewhere, unspecified severity, with anxiety: Secondary | ICD-10-CM | POA: Diagnosis not present

## 2023-02-17 DIAGNOSIS — I5032 Chronic diastolic (congestive) heart failure: Secondary | ICD-10-CM | POA: Diagnosis not present

## 2023-02-17 DIAGNOSIS — I110 Hypertensive heart disease with heart failure: Secondary | ICD-10-CM | POA: Diagnosis not present

## 2023-02-17 DIAGNOSIS — F411 Generalized anxiety disorder: Secondary | ICD-10-CM | POA: Diagnosis not present

## 2023-02-17 DIAGNOSIS — G301 Alzheimer's disease with late onset: Secondary | ICD-10-CM | POA: Diagnosis not present

## 2023-02-17 DIAGNOSIS — J309 Allergic rhinitis, unspecified: Secondary | ICD-10-CM | POA: Diagnosis not present

## 2023-02-17 DIAGNOSIS — I4510 Unspecified right bundle-branch block: Secondary | ICD-10-CM | POA: Diagnosis not present

## 2023-02-17 DIAGNOSIS — L97521 Non-pressure chronic ulcer of other part of left foot limited to breakdown of skin: Secondary | ICD-10-CM | POA: Diagnosis not present

## 2023-02-17 DIAGNOSIS — I87312 Chronic venous hypertension (idiopathic) with ulcer of left lower extremity: Secondary | ICD-10-CM | POA: Diagnosis not present

## 2023-02-17 DIAGNOSIS — K5730 Diverticulosis of large intestine without perforation or abscess without bleeding: Secondary | ICD-10-CM | POA: Diagnosis not present

## 2023-02-17 DIAGNOSIS — K5900 Constipation, unspecified: Secondary | ICD-10-CM | POA: Diagnosis not present

## 2023-02-17 DIAGNOSIS — M810 Age-related osteoporosis without current pathological fracture: Secondary | ICD-10-CM | POA: Diagnosis not present

## 2023-02-17 DIAGNOSIS — M47812 Spondylosis without myelopathy or radiculopathy, cervical region: Secondary | ICD-10-CM | POA: Diagnosis not present

## 2023-02-17 DIAGNOSIS — K219 Gastro-esophageal reflux disease without esophagitis: Secondary | ICD-10-CM | POA: Diagnosis not present

## 2023-02-17 DIAGNOSIS — L97821 Non-pressure chronic ulcer of other part of left lower leg limited to breakdown of skin: Secondary | ICD-10-CM | POA: Diagnosis not present

## 2023-02-18 NOTE — Progress Notes (Signed)
Heather [name] ([id number]) [id number] Page 1 of 8 Visit Report for 02/13/2023 Chief Complaint Document Details Patient Name: Date of Service: HA Heather [name] 02/13/2023 9:00 A M Medical Record Number: [id number] Patient Account Number: [id number] Date of Birth/Sex: Treating RN: 1918-10-14 (89 y.o. F) Primary Care Provider: Marlowe Sax Other Clinician: Referring Provider: Treating Provider/Extender: Grier Rocher, Dinah Weeks in Treatment: 5 Information Obtained from: Patient Chief Complaint 09/08/2022; left lower extremity wound 01/09/2023; left lower extremity areas of skin breakdown Electronic Signature(s) Signed: 02/13/2023 10:17:44 AM By: Kalman Shan DO Entered By: Kalman Shan on 02/13/2023 09:47:28 -------------------------------------------------------------------------------- Debridement Details Patient Name: Date of Service: [name], DO [place]. 02/13/2023 9:00 A M Medical Record Number: [id number] Patient Account Number: [id number] Date of Birth/Sex: Treating RN: 1918/08/02 (89 y.o. Tonita Phoenix, Lauren Primary Care Provider: Marlowe Sax Other Clinician: Referring Provider: Treating Provider/Extender: Kalman Shan Ngetich, Dinah Weeks in Treatment: 5 Debridement Performed for Assessment: Wound #2 Left,Circumferential Lower Leg Performed By: Physician Kalman Shan, DO Debridement Type: Debridement Severity of Tissue Pre Debridement: Fat layer exposed Level of Consciousness (Pre-procedure): Awake and Alert Pre-procedure Verification/Time Out Yes - 09:35 Taken: Start Time: 09:35 Pain Control: Lidocaine T Area Debrided (L x W): otal 7 (cm) x 4 (cm) = 28 (cm) Tissue and other material debrided: Viable, Non-Viable, Skin: Dermis , Skin: Epidermis Level: Skin/Epidermis Debridement Description: Selective/Open Wound Instrument: Curette Bleeding: Minimum Hemostasis Achieved: Pressure End Time:  09:35 Procedural Pain: 0 Post Procedural Pain: 0 Response to Treatment: Procedure was tolerated well Level of Consciousness (Post- Awake and Alert procedure): Post Debridement Measurements of Total Wound Length: (cm) 22 Width: (cm) 23.5 Depth: (cm) 0.1 Volume: (cm) 40.605 Character of Wound/Ulcer Post Debridement: Improved Severity of Tissue Post Debridement: Fat layer exposed Post Procedure Diagnosis Same as Pre-procedure Electronic Signature(s) Signed: 02/13/2023 10:17:44 AM By: Kalman Shan DO Signed: 02/18/2023 4:24:30 PM By: Rhae Hammock RN Heather [name], Heather [name] ([id number]) [id number] Page 2 of 8 Entered By: Rhae Hammock on 02/13/2023 09:37:46 -------------------------------------------------------------------------------- HPI Details Patient Name: Date of Service: HA Heather [name]. 02/13/2023 9:00 A M Medical Record Number: [id number] Patient Account Number: [id number] Date of Birth/Sex: Treating RN: 1917/12/29 (89 y.o. F) Primary Care Provider: Marlowe Sax Other Clinician: Referring Provider: Treating Provider/Extender: Grier Rocher, Dinah Weeks in Treatment: 5 History of Present Illness HPI Description: Admission 09/08/2022 [name] is a 89 year old female with a past medical history of venous insufficiency, chronic diastolic heart failure, dementia, and breast cancer that presents to the clinic for a 68-month history of nonhealing ulcer to the left lower extremity. She states over the past 2 months the site has become more painful And is weeping more. She visited her primary care office on 10/17 and was prescribed doxycycline for cellulitis of the left lower extremity. It is unclear if this has helped her symptoms. She has home health and they have been using Medihoney under compression therapy. They come out twice weekly. Her ABIs in office were 1.08. Currently she denies systemic signs of  infection. 10/31; patient presents for follow-up. Patient grew high levels of Pseudomonas aeruginosa, Staphylococcus aureus and Enterococcus bacillus from pcr culture at last clinic visit. Keystone antibiotic ointment has been ordered for the patient, she has not yet received this. She has tolerated the wrap well. 11/14; patient presents for follow-up. She started using Keystone antibiotic spray over the past 1 to 2 weeks. She has reported significant improvement in wound healing. She denies any pain. She has a small wound  remaining to the dorsal aspect of her foot. 11/21; patient presents for follow-up. At last clinic visit we have been using [facility] antibiotic spray with silver alginate under Kerlix/Coban. She states she ordered her compression stockings and they are in the mail. Her wounds have healed. 01/09/2023 [name] is a 89 year old female with a past medical history of venous insufficiency, chronic diastolic heart failure, dementia and breast cancer that presents the clinic for a 2-week history of skin breakdown to the left lower extremity. She was seen in our clinic several months ago for this issue. She states that home health is coming out twice a week and wrapping her legs. She currently denies signs of infection. She does not want to wear compression stockings. 3/8; patient presents for follow-up. She has been using Keystone antibiotic spray with silver alginate under Kerlix/Coban. She states that home health has been coming out. She has no issues or complaints today. 3/15; patient presents for follow-up. We have been using Keystone antibiotic spray with silver alginate and antifungal spray under Kerlix/Coban. She has no issues or complaints today. Area appears improved since last clinic visit with less maceration. 3/22; patient presents for follow-up. We have been using silver alginate with antifungal spray and antibiotic spray under Kerlix/Coban. Patient has no issues  or complaints today. Home health changes the dressings. 3/29; patient presents for follow-up. We have been using silver alginate with antifungal spray and antibiotic spray under Unna boot to the left lower extremity. Home health has been changing the dressings. Patient has no issues or complaints today. Electronic Signature(s) Signed: 02/13/2023 10:17:44 AM By: Kalman Shan DO Entered By: Kalman Shan on 02/13/2023 09:48:01 -------------------------------------------------------------------------------- Physical Exam Details Patient Name: Date of Service: [name], DO Heather J. 02/13/2023 9:00 A M Medical Record Number: [id number] Patient Account Number: [id number] Date of Birth/Sex: Treating RN: 10-14-1918 (89 y.o. F) Primary Care Provider: Marlowe Sax Other Clinician: Referring Provider: Treating Provider/Extender: Kalman Shan Ngetich, Dinah Weeks in Treatment: 5 Constitutional respirations regular, non-labored and within target range for patient.. Cardiovascular 2+ dorsalis pedis/posterior tibialis pulses. Psychiatric pleasant and cooperative. Notes Left lower extremity: Circumferential superficial breakdown to the leg and scattered areas of skin breakdown to the left foot. Slough accumulation to the toes. No signs of infection. Heather [name], Heather [name] ([id number]) [id number] Page 3 of 8 Electronic Signature(s) Signed: 02/13/2023 10:17:44 AM By: Kalman Shan DO Entered By: Kalman Shan on 02/13/2023 09:49:06 -------------------------------------------------------------------------------- Physician Orders Details Patient Name: Date of Service: [name], DO [place]. 02/13/2023 9:00 A M Medical Record Number: [id number] Patient Account Number: [id number] Date of Birth/Sex: Treating RN: 04/29/1918 (89 y.o. [name] Primary Care Provider: Marlowe Sax Other Clinician: Referring Provider: Treating Provider/Extender: Kalman Shan Ngetich, Dinah Weeks in Treatment: 5 Verbal / Phone Orders: No Diagnosis Coding Follow-up Appointments ppointment in 2 weeks. - Dr. Heber  and Heather [name] Rm # 7 Friday 02/27/23 @ 10:15 Return A Other: - Get some antifungal spray (over-the-counter) to use before the wrap goes on at each visit Bathing/ Shower/ Hygiene May shower with protection but do not get wound dressing(s) wet. Protect dressing(s) with water repellant cover (for example, large plastic bag) or a cast cover and may then take shower. Edema Control - Lymphedema / SCD / Other Elevate legs to the level of the heart or above for 30 minutes daily and/or when sitting for 3-4 times a day throughout the day. Avoid standing for long periods of time. Home Health New wound care orders this week; continue Home Health  for wound care. May utilize formulary equivalent dressing for wound treatment orders unless otherwise specified. - Antifungal spray to toes, Keystone, Calcium Alginate Ag to foot, ankle, and achilles area, abd pad, unna boot. Home health to change 2x a week. apply gauze between toes as well. Other Home Health Orders/Instructions: - advance home health Wound Treatment Wound #2 - Lower Leg Wound Laterality: Left, Circumferential Cleanser: Soap and Water (Home Health) 2 x Per Week/15 Days Discharge Instructions: May shower and wash wound with dial antibacterial soap and water prior to dressing change. Peri-Wound Care: Triamcinolone 15 (g) 2 x Per Week/15 Days Discharge Instructions: Use triamcinolone cream IN CLINIC ONLY. Topical: Keystone ([facility]) 2 x Per Week/15 Days Topical: antifungal spray (Home Health) 2 x Per Week/15 Days Discharge Instructions: apply over the counter antifungal spray at home to the toes. Ketoconazole in cilnic. Prim Dressing: Sorbalgon AG Dressing, 4x4 (in/in) (Home Health) 2 x Per Week/15 Days ary Discharge Instructions: Apply foot, ankle, and achilles area. Secondary Dressing: ABD  Pad, 5x9 (Home Health) 2 x Per Week/15 Days Discharge Instructions: Apply over primary dressing as directed. Compression Wrap: zinc unna boot (Home Health) 2 x Per Week/15 Days Electronic Signature(s) Signed: 02/13/2023 10:17:44 AM By: Kalman Shan DO Entered By: Kalman Shan on 02/13/2023 09:49:15 -------------------------------------------------------------------------------- Problem List Details Patient Name: Date of Service: [name], DO Heather [name] 02/13/2023 9:00 A M Medical Record Number: [id number] Patient Account Number: [id number] Date of Birth/Sex: Treating RN: 1918/09/25 (89 y.o. F) Primary Care Provider: Marlowe Sax Other Clinician: Referring Provider: Treating Provider/Extender: Grier Rocher, Dinah Weeks in Treatment: [place], Heather [name] ([id number]) [id number] Page 4 of 8 Active Problems ICD-10 Encounter Code Description Active Date MDM Diagnosis L97.821 Non-pressure chronic ulcer of other part of left lower leg limited to breakdown 01/09/2023 No Yes of skin I87.312 Chronic venous hypertension (idiopathic) with ulcer of left lower extremity 01/09/2023 No Yes [id number] Chronic diastolic (congestive) heart failure 01/09/2023 No Yes I10 Essential (primary) hypertension 01/09/2023 No Yes Z85.3 Personal history of malignant neoplasm of breast 01/09/2023 No Yes Inactive Problems Resolved Problems Electronic Signature(s) Signed: 02/13/2023 10:17:44 AM By: Kalman Shan DO Entered By: Kalman Shan on 02/13/2023 09:47:16 -------------------------------------------------------------------------------- Progress Note Details Patient Name: Date of Service: [name], DO Heather J. 02/13/2023 9:00 A M Medical Record Number: [id number] Patient Account Number: [id number] Date of Birth/Sex: Treating RN: 10-Sep-1918 (89 y.o. F) Primary Care Provider: Marlowe Sax Other Clinician: Referring Provider: Treating Provider/Extender: Grier Rocher, Dinah Weeks in Treatment: 5 Subjective Chief Complaint Information obtained from Patient 09/08/2022; left lower extremity wound 01/09/2023; left lower extremity areas of skin breakdown History of Present Illness (HPI) Admission 09/08/2022 Mr. Ifrah Golly is a 89 year old female with a past medical history of venous insufficiency, chronic diastolic heart failure, dementia, and breast cancer that presents to the clinic for a 54-month history of nonhealing ulcer to the left lower extremity. She states over the past 2 months the site has become more painful And is weeping more. She visited her primary care office on 10/17 and was prescribed doxycycline for cellulitis of the left lower extremity. It is unclear if this has helped her symptoms. She has home health and they have been using Medihoney under compression therapy. They come out twice weekly. Her ABIs in office were 1.08. Currently she denies systemic signs of infection. 10/31; patient presents for follow-up. Patient grew high levels of Pseudomonas aeruginosa, Staphylococcus aureus and Enterococcus bacillus from pcr culture at last clinic visit. Keystone antibiotic ointment has  been ordered for the patient, she has not yet received this. She has tolerated the wrap well. 11/14; patient presents for follow-up. She started using Keystone antibiotic spray over the past 1 to 2 weeks. She has reported significant improvement in wound healing. She denies any pain. She has a small wound remaining to the dorsal aspect of her foot. 11/21; patient presents for follow-up. At last clinic visit we have been using [facility] antibiotic spray with silver alginate under Kerlix/Coban. She states she ordered her compression stockings and they are in the mail. Her wounds have healed. 01/09/2023 Ms. Bailyn Rockoff is a 89 year old female with a past medical history of venous insufficiency, chronic diastolic heart failure, dementia and breast  cancer that presents the clinic for a 2-week history of skin breakdown to the left lower extremity. She was seen in our clinic several months ago for this issue. She states that home health is coming out twice a week and wrapping her legs. She currently denies signs of infection. She does not want to wear compression stockings. 3/8; patient presents for follow-up. She has been using Keystone antibiotic spray with silver alginate under Kerlix/Coban. She states that home health has been coming out. She has no issues or complaints today. Heather [name], Heather [name] ([id number]) [id number] Page 5 of 8 3/15; patient presents for follow-up. We have been using Keystone antibiotic spray with silver alginate and antifungal spray under Kerlix/Coban. She has no issues or complaints today. Area appears improved since last clinic visit with less maceration. 3/22; patient presents for follow-up. We have been using silver alginate with antifungal spray and antibiotic spray under Kerlix/Coban. Patient has no issues or complaints today. Home health changes the dressings. 3/29; patient presents for follow-up. We have been using silver alginate with antifungal spray and antibiotic spray under Unna boot to the left lower extremity. Home health has been changing the dressings. Patient has no issues or complaints today. Patient History Social History Never smoker, Marital Status - Widowed, Alcohol Use - Never, Drug Use - No History, Caffeine Use - Never. Medical History Cardiovascular Patient has history of Hypertension, Peripheral Venous Disease Hospitalization/Surgery History - left mastectomy. Medical A Surgical History Notes nd Gastrointestinal laryngopharyngeal reflux disease diverticulosis Musculoskeletal osteoporosis carpel tunnel cervical spondylosis DDD Objective Constitutional respirations regular, non-labored and within target range for patient.. Vitals Time Taken: 9:17 AM,  Temperature: 98.7 F, Pulse: 74 bpm, Respiratory Rate: 17 breaths/min, Blood Pressure: 145/78 mmHg. Cardiovascular 2+ dorsalis pedis/posterior tibialis pulses. Psychiatric pleasant and cooperative. General Notes: Left lower extremity: Circumferential superficial breakdown to the leg and scattered areas of skin breakdown to the left foot. Slough accumulation to the toes. No signs of infection. Integumentary (Hair, Skin) Wound #2 status is Open. Original cause of wound was Gradually Appeared. The date acquired was: 01/09/2023. The wound has been in treatment 5 weeks. The wound is located on the Left,Circumferential Lower Leg. The wound measures 22cm length x 23.5cm width x 0.1cm depth; 406.051cm^2 area and 40.605cm^3 volume. There is no tunneling or undermining noted. There is a medium amount of serosanguineous drainage noted. The wound margin is distinct with the outline attached to the wound base. There is large (67-100%) red, pink granulation within the wound bed. The periwound skin appearance exhibited: Maceration, Hemosiderin Staining. The periwound skin appearance did not exhibit: Callus, Crepitus, Excoriation, Induration, Rash, Scarring, Dry/Scaly, Atrophie Blanche, Cyanosis, Ecchymosis, Mottled, Pallor, Rubor, Erythema. Periwound temperature was noted as No Abnormality. The periwound has tenderness on palpation. Assessment Active Problems ICD-10 Non-pressure chronic ulcer  of other part of left lower leg limited to breakdown of skin Chronic venous hypertension (idiopathic) with ulcer of left lower extremity Chronic diastolic (congestive) heart failure Essential (primary) hypertension Personal history of malignant neoplasm of breast Overall patient's skin has less maceration. I recommended continued course with Keystone antibiotic ointment, antifungal spray and silver alginate under Unna boot. I debrided nonviable tissue. Follow-up in 2 weeks. Patient has home health who changes the  dressings. Procedures Wound #2 Heather [name], Heather [name] ([id number]) [id number] Page 6 of 8 Pre-procedure diagnosis of Wound #2 is a Venous Leg Ulcer located on the Left,Circumferential Lower Leg .Severity of Tissue Pre Debridement is: Fat layer exposed. There was a Selective/Open Wound Skin/Epidermis Debridement with a total area of 28 sq cm performed by Kalman Shan, DO. With the following instrument(s): Curette to remove Viable and Non-Viable tissue/material. Material removed includes Skin: Dermis and Skin: Epidermis and after achieving pain control using Lidocaine. No specimens were taken. A time out was conducted at 09:35, prior to the start of the procedure. A Minimum amount of bleeding was controlled with Pressure. The procedure was tolerated well with a pain level of 0 throughout and a pain level of 0 following the procedure. Post Debridement Measurements: 22cm length x 23.5cm width x 0.1cm depth; 40.605cm^3 volume. Character of Wound/Ulcer Post Debridement is improved. Severity of Tissue Post Debridement is: Fat layer exposed. Post procedure Diagnosis Wound #2: Same as Pre-Procedure Plan Follow-up Appointments: Return Appointment in 2 weeks. - Dr. Heber [place] and Heather [name] Rm # 7 Friday 02/27/23 @ 10:15 Other: - Get some antifungal spray (over-the-counter) to use before the wrap goes on at each visit Bathing/ Shower/ Hygiene: May shower with protection but do not get wound dressing(s) wet. Protect dressing(s) with water repellant cover (for example, large plastic bag) or a cast cover and may then take shower. Edema Control - Lymphedema / SCD / Other: Elevate legs to the level of the heart or above for 30 minutes daily and/or when sitting for 3-4 times a day throughout the day. Avoid standing for long periods of time. Home Health: New wound care orders this week; continue Home Health for wound care. May utilize formulary equivalent dressing for wound treatment orders  unless otherwise specified. - Antifungal spray to toes, Keystone, Calcium Alginate Ag to foot, ankle, and achilles area, abd pad, unna boot. Home health to change 2x a week. apply gauze between toes as well. Other Home Health Orders/Instructions: - advance home health WOUND #2: - Lower Leg Wound Laterality: Left, Circumferential Cleanser: Soap and Water (Home Health) 2 x Per Week/15 Days Discharge Instructions: May shower and wash wound with dial antibacterial soap and water prior to dressing change. Peri-Wound Care: Triamcinolone 15 (g) 2 x Per Week/15 Days Discharge Instructions: Use triamcinolone cream IN CLINIC ONLY. Topical: Keystone ([facility]) 2 x Per Week/15 Days Topical: antifungal spray (Home Health) 2 x Per Week/15 Days Discharge Instructions: apply over the counter antifungal spray at home to the toes. Ketoconazole in cilnic. Prim Dressing: Sorbalgon AG Dressing, 4x4 (in/in) (Home Health) 2 x Per Week/15 Days ary Discharge Instructions: Apply foot, ankle, and achilles area. Secondary Dressing: ABD Pad, 5x9 (Home Health) 2 x Per Week/15 Days Discharge Instructions: Apply over primary dressing as directed. Com pression Wrap: zinc unna boot (Home Health) 2 x Per Week/15 Days 1. In office sharp debridement 2. Silver alginate with Keystone antibiotic ointment and antifungal spray under Unna boot to the left lower extremity 3. Follow-up in 2 weeks Electronic Signature(s) Signed: 02/13/2023  10:17:44 AM By: Kalman Shan DO Entered By: Kalman Shan on 02/13/2023 09:51:22 -------------------------------------------------------------------------------- HxROS Details Patient Name: Date of Service: [name], DO [place] 02/13/2023 9:00 A M Medical Record Number: [id number] Patient Account Number: [id number] Date of Birth/Sex: Treating RN: 1917/12/09 (89 y.o. F) Primary Care Provider: Marlowe Sax Other Clinician: Referring Provider: Treating Provider/Extender: Kalman Shan Ngetich, Dinah Weeks in Treatment: 5 Cardiovascular Medical History: Positive for: Hypertension; Peripheral Venous Disease Gastrointestinal Medical History: Past Medical History Notes: laryngopharyngeal reflux disease diverticulosis Musculoskeletal Medical History: Past Medical History NotesMarland Kitchen Heather [name], Heather [name] ([id number]) [id number] Page 7 of 8 osteoporosis carpel tunnel cervical spondylosis DDD Immunizations Pneumococcal Vaccine: Received Pneumococcal Vaccination: Yes Received Pneumococcal Vaccination On or After 60th Birthday: Yes Implantable Devices No devices added Hospitalization / Surgery History Type of Hospitalization/Surgery left mastectomy Family and Social History Never smoker; Marital Status - Widowed; Alcohol Use: Never; Drug Use: No History; Caffeine Use: Never; Financial Concerns: No; Food, Clothing or Shelter Needs: No; Support System Lacking: No Electronic Signature(s) Signed: 02/13/2023 10:17:44 AM By: Kalman Shan DO Entered By: Kalman Shan on 02/13/2023 09:48:06 -------------------------------------------------------------------------------- SuperBill Details Patient Name: Date of Service: [name], DO [place]. 02/13/2023 Medical Record Number: [id number] Patient Account Number: [id number] Date of Birth/Sex: Treating RN: May 04, 1918 (89 y.o. F) Primary Care Provider: Marlowe Sax Other Clinician: Referring Provider: Treating Provider/Extender: Grier Rocher, Dinah Weeks in Treatment: 5 Diagnosis Coding ICD-10 Codes Code Description [phone] Non-pressure chronic ulcer of other part of left lower leg limited to breakdown of skin I87.312 Chronic venous hypertension (idiopathic) with ulcer of left lower extremity [id number] Chronic diastolic (congestive) heart failure I10 Essential (primary) hypertension Z85.3 Personal history of malignant neoplasm of breast Facility Procedures : CPT4 Code:  [id number] Description: [id number] - DEBRIDE WOUND 1ST 20 SQ CM OR < ICD-10 Diagnosis Description L97.821 Non-pressure chronic ulcer of other part of left lower leg limited to breakdown o Modifier: f skin Quantity: 1 : CPT4 Code: [id number] Description: [id number] - DEBRIDE WOUND EA ADDL 20 SQ CM ICD-10 Diagnosis Description L97.821 Non-pressure chronic ulcer of other part of left lower leg limited to breakdown o Modifier: f skin Quantity: 1 Physician Procedures : CPT4 Code Description Modifier [id number] - WC PHYS DEBR WO ANESTH 20 SQ CM ICD-10 Diagnosis Description L97.821 Non-pressure chronic ulcer of other part of left lower leg limited to breakdown of skin Quantity: 1 : [id number] - WC PHYS DEBR WO ANESTH EA ADD 20 CM ICD-10 Diagnosis Description L97.821 Non-pressure chronic ulcer of other part of left lower leg limited to breakdown of skin KATHRY, HILGEMAN ([id number]) [id number] Pa Quantity: 1 ge 8 of 8 Electronic Signature(s) Signed: 02/13/2023 10:17:44 AM By: Kalman Shan DO Entered By: Kalman Shan on 02/13/2023 09:51:51

## 2023-02-18 NOTE — Progress Notes (Signed)
YERALDY, CHALOUPKA ([id number]) [id number] Page 1 of 6 Visit Report for 02/13/2023 Arrival Information Details Patient Name: Date of Service: Heather [name] 02/13/2023 9:00 A M Medical Record Number: [id number] Patient Account Number: [id number] Date of Birth/Sex: Treating RN: [birth year] (89 y.o. Heather [name], Heather [name] Primary Care Aayliah Rotenberry: Marlowe Sax Other Clinician: Referring Vennessa Affinito: Treating Sontee Desena/Extender: Kalman Shan Ngetich, Dinah Weeks in Treatment: 5 Visit Information History Since Last Visit Added or deleted any medications: No Patient Arrived: Wheel Chair Any new allergies or adverse reactions: No Arrival Time: 09:16 Had a fall or experienced change in No Accompanied By: self activities of daily living that may affect Transfer Assistance: Manual risk of falls: Patient Identification Verified: Yes Signs or symptoms of abuse/neglect since last visito No Secondary Verification Process Completed: Yes Hospitalized since last visit: No Patient Requires Transmission-Based Precautions: No Implantable device outside of the clinic excluding No Patient Has Alerts: Yes cellular tissue based products placed in the center Patient Alerts: ABI's:L:1.08 10/23 since last visit: Has Dressing in Place as Prescribed: Yes Has Compression in Place as Prescribed: Yes Pain Present Now: No Electronic Signature(s) Signed: 02/18/2023 4:24:30 PM By: Rhae Hammock RN Entered By: Rhae Hammock on 02/13/2023 09:16:58 -------------------------------------------------------------------------------- Lower Extremity Assessment Details Patient Name: Date of Service: Heather [name], DO [place]. 02/13/2023 9:00 A M Medical Record Number: [id number] Patient Account Number: [id number] Date of Birth/Sex: Treating RN: [birth year] (89 y.o. Heather [name] Primary Care Kaylana Fenstermacher: Marlowe Sax Other Clinician: Referring Lenore Moyano: Treating Shemeka Wardle/Extender:  Kalman Shan Ngetich, Dinah Weeks in Treatment: 5 Edema Assessment Assessed: [Left: Yes] [Right: No] Edema: [Left: Ye] [Right: s] Calf Left: Right: Point of Measurement: 32 cm From Medial Instep 34.5 cm Ankle Left: Right: Point of Measurement: 10 cm From Medial Instep 23.5 cm Vascular Assessment Pulses: Dorsalis Pedis Palpable: [Left:Yes] Posterior Tibial Palpable: [Left:Yes] Electronic Signature(s) Signed: 02/18/2023 4:24:30 PM By: Rhae Hammock RN Entered By: Rhae Hammock on 02/13/2023 09:26:00 Schwandt, [name] ([id number]) [id number] Page 2 of 6 -------------------------------------------------------------------------------- Multi Wound Chart Details Patient Name: Date of Service: Heather [name] 02/13/2023 9:00 A M Medical Record Number: [id number] Patient Account Number: [id number] Date of Birth/Sex: Treating RN: 12-28-1917 (89 y.o. F) Primary Care Heather [name]: Marlowe Sax Other Clinician: Referring Alejos Reinhardt: Treating Alfretta Pinch/Extender: Kalman Shan Ngetich, Dinah Weeks in Treatment: 5 Vital Signs Height(in): Pulse(bpm): 74 Weight(lbs): Blood Pressure(mmHg): 145/78 Body Mass Index(BMI): Temperature(F): 98.7 Respiratory Rate(breaths/min): 17 [2:Photos:] [N/A:N/A] Left, Circumferential Lower Leg N/A N/A Wound Location: Gradually Appeared N/A N/A Wounding Event: Venous Leg Ulcer N/A N/A Primary Etiology: Hypertension, Peripheral Venous N/A N/A Comorbid History: Disease 01/09/2023 N/A N/A Date Acquired: 5 N/A N/A Weeks of Treatment: Open N/A N/A Wound Status: No N/A N/A Wound Recurrence: 22x23.5x0.1 N/A N/A Measurements L x W x D (cm) 406.051 N/A N/A A (cm) : rea 40.605 N/A N/A Volume (cm) : -26.70% N/A N/A % Reduction in A rea: -26.70% N/A N/A % Reduction in Volume: Full Thickness Without Exposed N/A N/A Classification: Support Structures Medium N/A N/A Exudate A mount: Serosanguineous N/A  N/A Exudate Type: red, brown N/A N/A Exudate Color: Distinct, outline attached N/A N/A Wound Margin: Large (67-100%) N/A N/A Granulation A mount: Red, Pink N/A N/A Granulation Quality: Fascia: No N/A N/A Exposed Structures: Fat Layer (Subcutaneous Tissue): No Tendon: No Muscle: No Joint: No Bone: No Small (1-33%) N/A N/A Epithelialization: Debridement - Selective/Open Wound N/A N/A Debridement: Pre-procedure Verification/Time Out 09:35 N/A N/A Taken: Lidocaine N/A N/A Pain Control: Skin/Epidermis N/A N/A Level: 28 N/A N/A  Debridement A (sq cm): rea Curette N/A N/A Instrument: Minimum N/A N/A Bleeding: Pressure N/A N/A Hemostasis A chieved: 0 N/A N/A Procedural Pain: 0 N/A N/A Post Procedural Pain: Procedure was tolerated well N/A N/A Debridement Treatment Response: 22x23.5x0.1 N/A N/A Post Debridement Measurements L x W x D (cm) 40.605 N/A N/A Post Debridement Volume: (cm) Excoriation: No N/A N/A Periwound Skin Texture: Induration: No Callus: No Crepitus: No Rash: No Scarring: No Maceration: Yes N/A N/A Periwound Skin Moisture: Dry/Scaly: No Hemosiderin Staining: Yes N/A N/A Periwound Skin Color: Heather [name] ([id number]) [phone].pdf Page 3 of 6 Atrophie Blanche: No Cyanosis: No Ecchymosis: No Erythema: No Mottled: No Pallor: No Rubor: No No Abnormality N/A N/A Temperature: Yes N/A N/A Tenderness on Palpation: Debridement N/A N/A Procedures Performed: Treatment Notes Electronic Signature(s) Signed: 02/13/2023 10:17:44 AM By: Kalman Shan DO Entered By: Kalman Shan on 02/13/2023 09:47:21 -------------------------------------------------------------------------------- Multi-Disciplinary Care Plan Details Patient Name: Date of Service: Heather [name], DO [name]. 02/13/2023 9:00 A M Medical Record Number: [id number] Patient Account Number: [id number] Date of Birth/Sex: Treating RN: 1918-01-22 (89 y.o. Heather [name], Heather [name] Primary Care Cydni Reddoch: Marlowe Sax Other Clinician: Referring Clance Baquero: Treating Shirely Toren/Extender: Kalman Shan Ngetich, Dinah Weeks in Treatment: 5 Active Inactive Wound/Skin Impairment Nursing Diagnoses: Impaired tissue integrity Knowledge deficit related to ulceration/compromised skin integrity Goals: Patient will have a decrease in wound volume by X% from date: (specify in notes) Date Initiated: 01/09/2023 Target Resolution Date: 03/20/2023 Goal Status: Active Patient/caregiver will verbalize understanding of skin care regimen Date Initiated: 01/09/2023 Target Resolution Date: 03/13/2023 Goal Status: Active Ulcer/skin breakdown will have a volume reduction of 30% by week 4 Date Initiated: 01/09/2023 Target Resolution Date: 02/14/2023 Goal Status: Active Interventions: Assess patient/caregiver ability to obtain necessary supplies Assess patient/caregiver ability to perform ulcer/skin care regimen upon admission and as needed Assess ulceration(s) every visit Notes: Electronic Signature(s) Signed: 02/18/2023 4:24:30 PM By: Rhae Hammock RN Entered By: Rhae Hammock on 02/13/2023 09:38:02 -------------------------------------------------------------------------------- Pain Assessment Details Patient Name: Date of Service: Heather [name], DO [place]. 02/13/2023 9:00 A M Medical Record Number: [id number] Patient Account Number: [id number] Date of Birth/Sex: Treating RN: 05/12/1918 (89 y.o. Heather [name] Primary Care Bria Portales: Marlowe Sax Other Clinician: Referring Yola Paradiso: Treating Neidra Girvan/Extender: Grier Rocher, Dinah Weeks in Treatment: [address] AUTYMN, GAZDA ([id number]) [id number] Page 4 of 6 Location of Pain Severity and Description of Pain Patient Has Paino No Site Locations Pain Management and Medication Current Pain Management: Electronic Signature(s) Signed: 02/18/2023 4:24:30 PM  By: Rhae Hammock RN Entered By: Rhae Hammock on 02/13/2023 09:17:16 -------------------------------------------------------------------------------- Patient/Caregiver Education Details Patient Name: Date of Service: Heather [name], DO RETHEA J. 3/29/2024andnbsp9:00 [place] Record Number: [id number] Patient Account Number: [id number] Date of Birth/Gender: Treating RN: [birth year] (89 y.o. Heather [name] Primary Care Physician: Marlowe Sax Other Clinician: Referring Physician: Treating Physician/Extender: Elsworth Soho in Treatment: 5 Education Assessment Education Provided To: Patient Education Topics Provided Wound/Skin Impairment: Methods: Explain/Verbal Responses: Reinforcements needed, State content correctly Electronic Signature(s) Signed: 02/18/2023 4:24:30 PM By: Rhae Hammock RN Entered By: Rhae Hammock on 02/13/2023 09:38:19 -------------------------------------------------------------------------------- Wound Assessment Details Patient Name: Date of Service: Heather [name], DO [place]. 02/13/2023 9:00 A M Medical Record Number: [id number] Patient Account Number: [id number] Date of Birth/Sex: Treating RN: [birth year] (89 y.o. Heather [name] Primary Care Jereme Loren: Marlowe Sax Other Clinician: Referring Irish Breisch: Treating Anita Mcadory/Extender: Kalman Shan Ngetich, Dinah Weeks in Treatment: 5 Wound Status Wound Number: 2 Primary Etiology: Venous Leg Ulcer Wound Location: Left,  Circumferential Lower Leg Wound Status: Open NETANYA, SORRENTI ([id number]) [phone].pdf Page 5 of 6 Wounding Event: Gradually Appeared Comorbid History: Hypertension, Peripheral Venous Disease Date Acquired: 01/09/2023 Weeks Of Treatment: 5 Clustered Wound: No Photos Wound Measurements Length: (cm) 22 Width: (cm) 23.5 Depth: (cm) 0.1 Area: (cm) 406.051 Volume: (cm) 40.605 % Reduction in Area: -26.7% % Reduction  in Volume: -26.7% Epithelialization: Small (1-33%) Tunneling: No Undermining: No Wound Description Classification: Full Thickness Without Exposed Support Structures Wound Margin: Distinct, outline attached Exudate Amount: Medium Exudate Type: Serosanguineous Exudate Color: red, brown Foul Odor After Cleansing: No Slough/Fibrino No Wound Bed Granulation Amount: Large (67-100%) Exposed Structure Granulation Quality: Red, Pink Fascia Exposed: No Fat Layer (Subcutaneous Tissue) Exposed: No Tendon Exposed: No Muscle Exposed: No Joint Exposed: No Bone Exposed: No Periwound Skin Texture Texture Color No Abnormalities Noted: No No Abnormalities Noted: No Callus: No Atrophie Blanche: No Crepitus: No Cyanosis: No Excoriation: No Ecchymosis: No Induration: No Erythema: No Rash: No Hemosiderin Staining: Yes Scarring: No Mottled: No Pallor: No Moisture Rubor: No No Abnormalities Noted: No Dry / Scaly: No Temperature / Pain Maceration: Yes Temperature: No Abnormality Tenderness on Palpation: Yes Electronic Signature(s) Signed: 02/18/2023 4:24:30 PM By: Rhae Hammock RN Entered By: Rhae Hammock on 02/13/2023 09:29:10 -------------------------------------------------------------------------------- Vitals Details Patient Name: Date of Service: Heather [name], DO [place]HE[place] J. 02/13/2023 9:00 A M Medical Record Number: [id number] Patient Account Number: [id number] Date of Birth/Sex: Treating RN: September 18, 1918 (89 y.o. Heather [name] Primary Care Chrystine Frogge: Marlowe Sax Other Clinician: Referring Dmitriy Gair: Treating Iva Posten/Extender: Grier Rocher, Dinah Weeks in Treatment: [address], [name] ([id number]) [id number] Page 6 of 6 Vital Signs Time Taken: 09:17 Temperature (F): 98.7 Pulse (bpm): 74 Respiratory Rate (breaths/min): 17 Blood Pressure (mmHg): 145/78 Reference Range: 80 - 120 mg / dl Electronic Signature(s) Signed:  02/18/2023 4:24:30 PM By: Rhae Hammock RN Entered By: Rhae Hammock on 02/13/2023 09:20:45

## 2023-02-19 DIAGNOSIS — F0284 Dementia in other diseases classified elsewhere, unspecified severity, with anxiety: Secondary | ICD-10-CM | POA: Diagnosis not present

## 2023-02-19 DIAGNOSIS — F411 Generalized anxiety disorder: Secondary | ICD-10-CM | POA: Diagnosis not present

## 2023-02-19 DIAGNOSIS — K5730 Diverticulosis of large intestine without perforation or abscess without bleeding: Secondary | ICD-10-CM | POA: Diagnosis not present

## 2023-02-19 DIAGNOSIS — M810 Age-related osteoporosis without current pathological fracture: Secondary | ICD-10-CM | POA: Diagnosis not present

## 2023-02-19 DIAGNOSIS — I5032 Chronic diastolic (congestive) heart failure: Secondary | ICD-10-CM | POA: Diagnosis not present

## 2023-02-19 DIAGNOSIS — L97521 Non-pressure chronic ulcer of other part of left foot limited to breakdown of skin: Secondary | ICD-10-CM | POA: Diagnosis not present

## 2023-02-19 DIAGNOSIS — I110 Hypertensive heart disease with heart failure: Secondary | ICD-10-CM | POA: Diagnosis not present

## 2023-02-19 DIAGNOSIS — I87312 Chronic venous hypertension (idiopathic) with ulcer of left lower extremity: Secondary | ICD-10-CM | POA: Diagnosis not present

## 2023-02-19 DIAGNOSIS — K5900 Constipation, unspecified: Secondary | ICD-10-CM | POA: Diagnosis not present

## 2023-02-19 DIAGNOSIS — L97821 Non-pressure chronic ulcer of other part of left lower leg limited to breakdown of skin: Secondary | ICD-10-CM | POA: Diagnosis not present

## 2023-02-19 DIAGNOSIS — I4510 Unspecified right bundle-branch block: Secondary | ICD-10-CM | POA: Diagnosis not present

## 2023-02-19 DIAGNOSIS — G301 Alzheimer's disease with late onset: Secondary | ICD-10-CM | POA: Diagnosis not present

## 2023-02-19 DIAGNOSIS — K219 Gastro-esophageal reflux disease without esophagitis: Secondary | ICD-10-CM | POA: Diagnosis not present

## 2023-02-19 DIAGNOSIS — M47812 Spondylosis without myelopathy or radiculopathy, cervical region: Secondary | ICD-10-CM | POA: Diagnosis not present

## 2023-02-19 DIAGNOSIS — J309 Allergic rhinitis, unspecified: Secondary | ICD-10-CM | POA: Diagnosis not present

## 2023-02-19 DIAGNOSIS — G894 Chronic pain syndrome: Secondary | ICD-10-CM | POA: Diagnosis not present

## 2023-02-23 DIAGNOSIS — I5032 Chronic diastolic (congestive) heart failure: Secondary | ICD-10-CM | POA: Diagnosis not present

## 2023-02-23 DIAGNOSIS — I4510 Unspecified right bundle-branch block: Secondary | ICD-10-CM | POA: Diagnosis not present

## 2023-02-23 DIAGNOSIS — L97521 Non-pressure chronic ulcer of other part of left foot limited to breakdown of skin: Secondary | ICD-10-CM | POA: Diagnosis not present

## 2023-02-23 DIAGNOSIS — I87312 Chronic venous hypertension (idiopathic) with ulcer of left lower extremity: Secondary | ICD-10-CM | POA: Diagnosis not present

## 2023-02-23 DIAGNOSIS — M47812 Spondylosis without myelopathy or radiculopathy, cervical region: Secondary | ICD-10-CM | POA: Diagnosis not present

## 2023-02-23 DIAGNOSIS — G301 Alzheimer's disease with late onset: Secondary | ICD-10-CM | POA: Diagnosis not present

## 2023-02-23 DIAGNOSIS — G894 Chronic pain syndrome: Secondary | ICD-10-CM | POA: Diagnosis not present

## 2023-02-23 DIAGNOSIS — M810 Age-related osteoporosis without current pathological fracture: Secondary | ICD-10-CM | POA: Diagnosis not present

## 2023-02-23 DIAGNOSIS — F411 Generalized anxiety disorder: Secondary | ICD-10-CM | POA: Diagnosis not present

## 2023-02-23 DIAGNOSIS — K5900 Constipation, unspecified: Secondary | ICD-10-CM | POA: Diagnosis not present

## 2023-02-23 DIAGNOSIS — I110 Hypertensive heart disease with heart failure: Secondary | ICD-10-CM | POA: Diagnosis not present

## 2023-02-23 DIAGNOSIS — F0284 Dementia in other diseases classified elsewhere, unspecified severity, with anxiety: Secondary | ICD-10-CM | POA: Diagnosis not present

## 2023-02-23 DIAGNOSIS — J309 Allergic rhinitis, unspecified: Secondary | ICD-10-CM | POA: Diagnosis not present

## 2023-02-23 DIAGNOSIS — K219 Gastro-esophageal reflux disease without esophagitis: Secondary | ICD-10-CM | POA: Diagnosis not present

## 2023-02-23 DIAGNOSIS — L97821 Non-pressure chronic ulcer of other part of left lower leg limited to breakdown of skin: Secondary | ICD-10-CM | POA: Diagnosis not present

## 2023-02-23 DIAGNOSIS — K5730 Diverticulosis of large intestine without perforation or abscess without bleeding: Secondary | ICD-10-CM | POA: Diagnosis not present

## 2023-02-25 DIAGNOSIS — J309 Allergic rhinitis, unspecified: Secondary | ICD-10-CM | POA: Diagnosis not present

## 2023-02-25 DIAGNOSIS — K219 Gastro-esophageal reflux disease without esophagitis: Secondary | ICD-10-CM | POA: Diagnosis not present

## 2023-02-25 DIAGNOSIS — K5900 Constipation, unspecified: Secondary | ICD-10-CM | POA: Diagnosis not present

## 2023-02-25 DIAGNOSIS — M810 Age-related osteoporosis without current pathological fracture: Secondary | ICD-10-CM | POA: Diagnosis not present

## 2023-02-25 DIAGNOSIS — I5032 Chronic diastolic (congestive) heart failure: Secondary | ICD-10-CM | POA: Diagnosis not present

## 2023-02-25 DIAGNOSIS — I110 Hypertensive heart disease with heart failure: Secondary | ICD-10-CM | POA: Diagnosis not present

## 2023-02-25 DIAGNOSIS — I4510 Unspecified right bundle-branch block: Secondary | ICD-10-CM | POA: Diagnosis not present

## 2023-02-25 DIAGNOSIS — I87312 Chronic venous hypertension (idiopathic) with ulcer of left lower extremity: Secondary | ICD-10-CM | POA: Diagnosis not present

## 2023-02-25 DIAGNOSIS — F0284 Dementia in other diseases classified elsewhere, unspecified severity, with anxiety: Secondary | ICD-10-CM | POA: Diagnosis not present

## 2023-02-25 DIAGNOSIS — G301 Alzheimer's disease with late onset: Secondary | ICD-10-CM | POA: Diagnosis not present

## 2023-02-25 DIAGNOSIS — L97521 Non-pressure chronic ulcer of other part of left foot limited to breakdown of skin: Secondary | ICD-10-CM | POA: Diagnosis not present

## 2023-02-25 DIAGNOSIS — M47812 Spondylosis without myelopathy or radiculopathy, cervical region: Secondary | ICD-10-CM | POA: Diagnosis not present

## 2023-02-25 DIAGNOSIS — F411 Generalized anxiety disorder: Secondary | ICD-10-CM | POA: Diagnosis not present

## 2023-02-25 DIAGNOSIS — G894 Chronic pain syndrome: Secondary | ICD-10-CM | POA: Diagnosis not present

## 2023-02-25 DIAGNOSIS — K5730 Diverticulosis of large intestine without perforation or abscess without bleeding: Secondary | ICD-10-CM | POA: Diagnosis not present

## 2023-02-25 DIAGNOSIS — L97821 Non-pressure chronic ulcer of other part of left lower leg limited to breakdown of skin: Secondary | ICD-10-CM | POA: Diagnosis not present

## 2023-02-27 ENCOUNTER — Encounter (HOSPITAL_BASED_OUTPATIENT_CLINIC_OR_DEPARTMENT_OTHER): Payer: Medicare Other | Attending: Internal Medicine | Admitting: Internal Medicine

## 2023-02-27 DIAGNOSIS — I87312 Chronic venous hypertension (idiopathic) with ulcer of left lower extremity: Secondary | ICD-10-CM | POA: Insufficient documentation

## 2023-02-27 DIAGNOSIS — Z853 Personal history of malignant neoplasm of breast: Secondary | ICD-10-CM | POA: Diagnosis not present

## 2023-02-27 DIAGNOSIS — L97821 Non-pressure chronic ulcer of other part of left lower leg limited to breakdown of skin: Secondary | ICD-10-CM | POA: Diagnosis not present

## 2023-02-27 DIAGNOSIS — I5032 Chronic diastolic (congestive) heart failure: Secondary | ICD-10-CM | POA: Diagnosis not present

## 2023-02-27 DIAGNOSIS — I110 Hypertensive heart disease with heart failure: Secondary | ICD-10-CM | POA: Insufficient documentation

## 2023-02-27 DIAGNOSIS — F0390 Unspecified dementia without behavioral disturbance: Secondary | ICD-10-CM | POA: Diagnosis not present

## 2023-03-03 DIAGNOSIS — I110 Hypertensive heart disease with heart failure: Secondary | ICD-10-CM | POA: Diagnosis not present

## 2023-03-03 DIAGNOSIS — I87312 Chronic venous hypertension (idiopathic) with ulcer of left lower extremity: Secondary | ICD-10-CM | POA: Diagnosis not present

## 2023-03-03 DIAGNOSIS — I5032 Chronic diastolic (congestive) heart failure: Secondary | ICD-10-CM | POA: Diagnosis not present

## 2023-03-03 DIAGNOSIS — M810 Age-related osteoporosis without current pathological fracture: Secondary | ICD-10-CM | POA: Diagnosis not present

## 2023-03-03 DIAGNOSIS — I4510 Unspecified right bundle-branch block: Secondary | ICD-10-CM | POA: Diagnosis not present

## 2023-03-03 DIAGNOSIS — K5730 Diverticulosis of large intestine without perforation or abscess without bleeding: Secondary | ICD-10-CM | POA: Diagnosis not present

## 2023-03-03 DIAGNOSIS — F0284 Dementia in other diseases classified elsewhere, unspecified severity, with anxiety: Secondary | ICD-10-CM | POA: Diagnosis not present

## 2023-03-03 DIAGNOSIS — L97821 Non-pressure chronic ulcer of other part of left lower leg limited to breakdown of skin: Secondary | ICD-10-CM | POA: Diagnosis not present

## 2023-03-03 DIAGNOSIS — J309 Allergic rhinitis, unspecified: Secondary | ICD-10-CM | POA: Diagnosis not present

## 2023-03-03 DIAGNOSIS — M47812 Spondylosis without myelopathy or radiculopathy, cervical region: Secondary | ICD-10-CM | POA: Diagnosis not present

## 2023-03-03 DIAGNOSIS — K219 Gastro-esophageal reflux disease without esophagitis: Secondary | ICD-10-CM | POA: Diagnosis not present

## 2023-03-03 DIAGNOSIS — G301 Alzheimer's disease with late onset: Secondary | ICD-10-CM | POA: Diagnosis not present

## 2023-03-03 DIAGNOSIS — K5900 Constipation, unspecified: Secondary | ICD-10-CM | POA: Diagnosis not present

## 2023-03-03 DIAGNOSIS — F411 Generalized anxiety disorder: Secondary | ICD-10-CM | POA: Diagnosis not present

## 2023-03-03 DIAGNOSIS — G894 Chronic pain syndrome: Secondary | ICD-10-CM | POA: Diagnosis not present

## 2023-03-03 DIAGNOSIS — L97521 Non-pressure chronic ulcer of other part of left foot limited to breakdown of skin: Secondary | ICD-10-CM | POA: Diagnosis not present

## 2023-03-03 NOTE — Progress Notes (Signed)
[name], [name] ([id number]) [id number] Page 1 of 7 Visit Report for 02/27/2023 Chief Complaint Document Details Patient Name: Date of Service: HA Heather [name] 02/27/2023 10:15 A M Medical Record Number: [id number] Patient Account Number: [id number] Date of Birth/Sex: Treating RN: 01/20/1918 (89 y.o. F) Primary Care Provider: Richarda Blade Other Clinician: Referring Provider: Treating Provider/Extender: Velna Ochs, Dinah Weeks in Treatment: 7 Information Obtained from: Patient Chief Complaint 09/08/2022; left lower extremity wound 01/09/2023; left lower extremity areas of skin breakdown Electronic Signature(s) Signed: 02/27/2023 12:30:48 PM By: Geralyn Corwin DO Entered By: Geralyn Corwin on 02/27/2023 11:35:31 -------------------------------------------------------------------------------- Debridement Details Patient Name: Date of Service: [name], DO RETHEA J. 02/27/2023 10:15 A M Medical Record Number: [id number] Patient Account Number: [id number] Date of Birth/Sex: Treating RN: [birth year] (89 y.o. Heather [name], Heather [name] Primary Care Provider: Richarda Blade Other Clinician: Referring Provider: Treating Provider/Extender: Geralyn Corwin Ngetich, Dinah Weeks in Treatment: 7 Debridement Performed for Assessment: Wound #2 Left,Circumferential Lower Leg Performed By: Physician Geralyn Corwin, DO Debridement Type: Debridement Severity of Tissue Pre Debridement: Fat layer exposed Level of Consciousness (Pre-procedure): Awake and Alert Pre-procedure Verification/Time Out Yes - 11:07 Taken: Start Time: 11:07 Pain Control: Lidocaine T Area Debrided (L x W): otal 7 (cm) x 7 (cm) = 49 (cm) Tissue and other material debrided: Viable, Non-Viable, Slough, Skin: Dermis , Skin: Epidermis, Slough Level: Skin/Epidermis Debridement Description: Selective/Open Wound Instrument: Curette Bleeding: Minimum Hemostasis Achieved:  Pressure End Time: 11:07 Procedural Pain: 0 Post Procedural Pain: 0 Response to Treatment: Procedure was tolerated well Level of Consciousness (Post- Awake and Alert procedure): Post Debridement Measurements of Total Wound Length: (cm) 22 Width: (cm) 23 Depth: (cm) 0.1 Volume: (cm) 39.741 Character of Wound/Ulcer Post Debridement: Improved Severity of Tissue Post Debridement: Fat layer exposed Post Procedure Diagnosis Same as Pre-procedure Electronic Signature(s) Signed: 02/27/2023 12:30:48 PM By: Geralyn Corwin DO Signed: 03/03/2023 11:10:16 AM By: Fonnie Mu RN Tiburcio Pea, Rolena Infante ([id number]) [id number] Page 2 of 7 Entered By: Fonnie Mu on 02/27/2023 11:08:44 -------------------------------------------------------------------------------- HPI Details Patient Name: Date of Service: HA Heather [name] 02/27/2023 10:15 A M Medical Record Number: [id number] Patient Account Number: [id number] Date of Birth/Sex: Treating RN: [birth year] (89 y.o. F) Primary Care Provider: Richarda Blade Other Clinician: Referring Provider: Treating Provider/Extender: Velna Ochs, Dinah Weeks in Treatment: 7 History of Present Illness HPI Description: Admission 09/08/2022 [name] is a 89 year old female with a past medical history of venous insufficiency, chronic diastolic heart failure, dementia, and breast cancer that presents to the clinic for a 61-month history of nonhealing ulcer to the left lower extremity. She states over the past 2 months the site has become more painful And is weeping more. She visited her primary care office on 10/17 and was prescribed doxycycline for cellulitis of the left lower extremity. It is unclear if this has helped her symptoms. She has home health and they have been using Medihoney under compression therapy. They come out twice weekly. Her ABIs in office were 1.08. Currently she denies systemic  signs of infection. 10/31; patient presents for follow-up. Patient grew high levels of Pseudomonas aeruginosa, Staphylococcus aureus and Enterococcus bacillus from pcr culture at last clinic visit. Keystone antibiotic ointment has been ordered for the patient, she has not yet received this. She has tolerated the wrap well. 11/14; patient presents for follow-up. She started using Keystone antibiotic spray over the past 1 to 2 weeks. She has reported significant improvement in wound healing. She denies any pain. She has a  small wound remaining to the dorsal aspect of her foot. 11/21; patient presents for follow-up. At last clinic visit we have been using [facility] antibiotic spray with silver alginate under Kerlix/Coban. She states she ordered her compression stockings and they are in the mail. Her wounds have healed. 01/09/2023 [name] is a 89 year old female with a past medical history of venous insufficiency, chronic diastolic heart failure, dementia and breast cancer that presents the clinic for a 2-week history of skin breakdown to the left lower extremity. She was seen in our clinic several months ago for this issue. She states that home health is coming out twice a week and wrapping her legs. She currently denies signs of infection. She does not want to wear compression stockings. 3/8; patient presents for follow-up. She has been using Keystone antibiotic spray with silver alginate under Kerlix/Coban. She states that home health has been coming out. She has no issues or complaints today. 3/15; patient presents for follow-up. We have been using Keystone antibiotic spray with silver alginate and antifungal spray under Kerlix/Coban. She has no issues or complaints today. Area appears improved since last clinic visit with less maceration. 3/22; patient presents for follow-up. We have been using silver alginate with antifungal spray and antibiotic spray under Kerlix/Coban. Patient has no  issues or complaints today. Home health changes the dressings. 3/29; patient presents for follow-up. We have been using silver alginate with antifungal spray and antibiotic spray under Unna boot to the left lower extremity. Home health has been changing the dressings. Patient has no issues or complaints today. 4/12; patient presents for follow-up. We have been using silver alginate with Keystone antibiotic ointment under [employer]. She has some nonviable tissue accumulation to the toes. Electronic Signature(s) Signed: 02/27/2023 12:30:48 PM By: Geralyn Corwin DO Entered By: Geralyn Corwin on 02/27/2023 11:36:17 -------------------------------------------------------------------------------- Physical Exam Details Patient Name: Date of Service: [name], DO RETHEA J. 02/27/2023 10:15 A M Medical Record Number: [id number] Patient Account Number: [id number] Date of Birth/Sex: Treating RN: 10-17-1918 (89 y.o. F) Primary Care Provider: Richarda Blade Other Clinician: Referring Provider: Treating Provider/Extender: Geralyn Corwin Ngetich, Dinah Weeks in Treatment: 7 Constitutional respirations regular, non-labored and within target range for patient.. Cardiovascular 2+ dorsalis pedis/posterior tibialis pulses. Psychiatric pleasant and cooperative. KINLEIGH, NAULT ([id number]) [id number] Page 3 of 7 Notes Left lower extremity: Circumferential superficial breakdown to the leg and scattered areas of skin breakdown to the left foot. Slough accumulation to the toes. No signs of infection. Electronic Signature(s) Signed: 02/27/2023 12:30:48 PM By: Geralyn Corwin DO Entered By: Geralyn Corwin on 02/27/2023 11:36:47 -------------------------------------------------------------------------------- Physician Orders Details Patient Name: Date of Service: [name], DO RETHEA J. 02/27/2023 10:15 A M Medical Record Number: [id number] Patient Account Number:  [id number] Date of Birth/Sex: Treating RN: 27-Jul-1918 (89 y.o. Toniann Fail Primary Care Provider: Richarda Blade Other Clinician: Referring Provider: Treating Provider/Extender: Geralyn Corwin Ngetich, Dinah Weeks in Treatment: 7 Verbal / Phone Orders: No Diagnosis Coding Follow-up Appointments ppointment in 2 weeks. - Dr. Waynette Buttery Rm # 8 Friday 03/13/23 @ 11:00 Return A Other: - Get some antifungal spray (over-the-counter) to use before the wrap goes on at each visit Bathing/ Shower/ Hygiene May shower with protection but do not get wound dressing(s) wet. Protect dressing(s) with water repellant cover (for example, large plastic bag) or a cast cover and may then take shower. Edema Control - Lymphedema / SCD / Other Elevate legs to the level of the heart or above for 30 minutes daily and/or  when sitting for 3-4 times a day throughout the day. Avoid standing for long periods of time. Home Health New wound care orders this week; continue Home Health for wound care. May utilize formulary equivalent dressing for wound treatment orders unless otherwise specified. - Antifungal spray to toes. zinc oxide to weeping areas on leg. zinc unna boot. Home health to change 2x a week. apply gauze between toes as well. Other Home Health Orders/Instructions: - [facility] Wound Treatment Wound #2 - Lower Leg Wound Laterality: Left, Circumferential Cleanser: Soap and Water (Home Health) 2 x Per Week/15 Days Discharge Instructions: May shower and wash wound with dial antibacterial soap and water prior to dressing change. Peri-Wound Care: Zinc Oxide Ointment 30g tube 2 x Per Week/15 Days Discharge Instructions: Apply Zinc Oxide to periwound with each dressing change Topical: antifungal spray (Home Health) 2 x Per Week/15 Days Discharge Instructions: apply over the counter antifungal spray at home to the toes. Ketoconazole in cilnic. Compression Wrap: zinc unna boot (Home Health)  2 x Per Week/15 Days Electronic Signature(s) Signed: 02/27/2023 12:30:48 PM By: Geralyn Corwin DO Entered By: Geralyn Corwin on 02/27/2023 11:36:54 -------------------------------------------------------------------------------- Problem List Details Patient Name: Date of Service: [name], DO RETHEA J. 02/27/2023 10:15 A M Medical Record Number: [id number] Patient Account Number: [id number] Date of Birth/Sex: Treating RN: October 30, 1918 (89 y.o. F) Primary Care Provider: Richarda Blade Other Clinician: Referring Provider: Treating Provider/Extender: Jae Dire in Treatment: 7 Active Problems ICD-10 MAJA, MCCAFFERY ([id number]) [id number] Page 4 of 7 Encounter Code Description Active Date MDM Diagnosis L97.821 Non-pressure chronic ulcer of other part of left lower leg limited to breakdown 01/09/2023 No Yes of skin I87.312 Chronic venous hypertension (idiopathic) with ulcer of left lower extremity 01/09/2023 No Yes I50.32 Chronic diastolic (congestive) heart failure 01/09/2023 No Yes I10 Essential (primary) hypertension 01/09/2023 No Yes Z85.3 Personal history of malignant neoplasm of breast 01/09/2023 No Yes Inactive Problems Resolved Problems Electronic Signature(s) Signed: 02/27/2023 12:30:48 PM By: Geralyn Corwin DO Entered By: Geralyn Corwin on 02/27/2023 11:35:19 -------------------------------------------------------------------------------- Progress Note Details Patient Name: Date of Service: [name], DO RETHEA J. 02/27/2023 10:15 A M Medical Record Number: [id number] Patient Account Number: [id number] Date of Birth/Sex: Treating RN: 1918/04/02 (89 y.o. F) Primary Care Provider: Richarda Blade Other Clinician: Referring Provider: Treating Provider/Extender: Velna Ochs, Dinah Weeks in Treatment: 7 Subjective Chief Complaint Information obtained from Patient 09/08/2022; left lower extremity wound  01/09/2023; left lower extremity areas of skin breakdown History of Present Illness (HPI) Admission 09/08/2022 Mr. Ashyra Cantin is a 89 year old female with a past medical history of venous insufficiency, chronic diastolic heart failure, dementia, and breast cancer that presents to the clinic for a 12-month history of nonhealing ulcer to the left lower extremity. She states over the past 2 months the site has become more painful And is weeping more. She visited her primary care office on 10/17 and was prescribed doxycycline for cellulitis of the left lower extremity. It is unclear if this has helped her symptoms. She has home health and they have been using Medihoney under compression therapy. They come out twice weekly. Her ABIs in office were 1.08. Currently she denies systemic signs of infection. 10/31; patient presents for follow-up. Patient grew high levels of Pseudomonas aeruginosa, Staphylococcus aureus and Enterococcus bacillus from pcr culture at last clinic visit. Keystone antibiotic ointment has been ordered for the patient, she has not yet received this. She has tolerated the wrap well. 11/14; patient presents for follow-up. She  started using Keystone antibiotic spray over the past 1 to 2 weeks. She has reported significant improvement in wound healing. She denies any pain. She has a small wound remaining to the dorsal aspect of her foot. 11/21; patient presents for follow-up. At last clinic visit we have been using [facility] antibiotic spray with silver alginate under Kerlix/Coban. She states she ordered her compression stockings and they are in the mail. Her wounds have healed. 01/09/2023 Ms. Annaleia Pence is a 89 year old female with a past medical history of venous insufficiency, chronic diastolic heart failure, dementia and breast cancer that presents the clinic for a 2-week history of skin breakdown to the left lower extremity. She was seen in our clinic several months ago for  this issue. She states that home health is coming out twice a week and wrapping her legs. She currently denies signs of infection. She does not want to wear compression stockings. 3/8; patient presents for follow-up. She has been using Keystone antibiotic spray with silver alginate under Kerlix/Coban. She states that home health has been coming out. She has no issues or complaints today. 3/15; patient presents for follow-up. We have been using Keystone antibiotic spray with silver alginate and antifungal spray under Kerlix/Coban. She has no issues or complaints today. Area appears improved since last clinic visit with less maceration. 3/22; patient presents for follow-up. We have been using silver alginate with antifungal spray and antibiotic spray under Kerlix/Coban. Patient has no issues or BEONKA, AMESQUITA ([id number]) [id number] Page 5 of 7 complaints today. Home health changes the dressings. 3/29; patient presents for follow-up. We have been using silver alginate with antifungal spray and antibiotic spray under Unna boot to the left lower extremity. Home health has been changing the dressings. Patient has no issues or complaints today. 4/12; patient presents for follow-up. We have been using silver alginate with Keystone antibiotic ointment under [employer]. She has some nonviable tissue accumulation to the toes. Patient History Social History Never smoker, Marital Status - Widowed, Alcohol Use - Never, Drug Use - No History, Caffeine Use - Never. Medical History Cardiovascular Patient has history of Hypertension, Peripheral Venous Disease Hospitalization/Surgery History - left mastectomy. Medical A Surgical History Notes nd Gastrointestinal laryngopharyngeal reflux disease diverticulosis Musculoskeletal osteoporosis carpel tunnel cervical spondylosis DDD Objective Constitutional respirations regular, non-labored and within target range for  patient.. Vitals Time Taken: 10:36 AM, Temperature: 97.7 F, Pulse: 73 bpm, Respiratory Rate: 17 breaths/min, Blood Pressure: 112/47 mmHg. Cardiovascular 2+ dorsalis pedis/posterior tibialis pulses. Psychiatric pleasant and cooperative. General Notes: Left lower extremity: Circumferential superficial breakdown to the leg and scattered areas of skin breakdown to the left foot. Slough accumulation to the toes. No signs of infection. Integumentary (Hair, Skin) Wound #2 status is Open. Original cause of wound was Gradually Appeared. The date acquired was: 01/09/2023. The wound has been in treatment 7 weeks. The wound is located on the Left,Circumferential Lower Leg. The wound measures 22cm length x 23cm width x 0.1cm depth; 397.411cm^2 area and 39.741cm^3 volume. There is no tunneling or undermining noted. There is a medium amount of serosanguineous drainage noted. The wound margin is distinct with the outline attached to the wound base. There is large (67-100%) red, pink granulation within the wound bed. The periwound skin appearance exhibited: Maceration, Hemosiderin Staining. The periwound skin appearance did not exhibit: Callus, Crepitus, Excoriation, Induration, Rash, Scarring, Dry/Scaly, Atrophie Blanche, Cyanosis, Ecchymosis, Mottled, Pallor, Rubor, Erythema. Periwound temperature was noted as No Abnormality. The periwound has tenderness on palpation. Assessment Active Problems  ICD-10 Non-pressure chronic ulcer of other part of left lower leg limited to breakdown of skin Chronic venous hypertension (idiopathic) with ulcer of left lower extremity Chronic diastolic (congestive) heart failure Essential (primary) hypertension Personal history of malignant neoplasm of breast Patient's wounds are stable. I debrided nonviable tissue. At this time I recommended only doing a zinc oxide Unna boot to see if this will heal up the rest of the skin breakdown. Follow-up in 2 weeks. Patient has home  health that changes the dressings. Procedures Wound #2 Pre-procedure diagnosis of Wound #2 is a Venous Leg Ulcer located on the Left,Circumferential Lower Leg .Severity of Tissue Pre Debridement is: Fat layer SHANEESE, TAIT ([id number]) [phone].pdf Page 6 of 7 exposed. There was a Selective/Open Wound Skin/Epidermis Debridement with a total area of 49 sq cm performed by Geralyn Corwin, DO. With the following instrument(s): Curette to remove Viable and Non-Viable tissue/material. Material removed includes Slough, Skin: Dermis, and Skin: Epidermis after achieving pain control using Lidocaine. No specimens were taken. A time out was conducted at 11:07, prior to the start of the procedure. A Minimum amount of bleeding was controlled with Pressure. The procedure was tolerated well with a pain level of 0 throughout and a pain level of 0 following the procedure. Post Debridement Measurements: 22cm length x 23cm width x 0.1cm depth; 39.741cm^3 volume. Character of Wound/Ulcer Post Debridement is improved. Severity of Tissue Post Debridement is: Fat layer exposed. Post procedure Diagnosis Wound #2: Same as Pre-Procedure Pre-procedure diagnosis of Wound #2 is a Venous Leg Ulcer located on the Left,Circumferential Lower Leg . There was a Radio broadcast assistant Compression Therapy Procedure by Fonnie Mu, RN. Post procedure Diagnosis Wound #2: Same as Pre-Procedure Plan Follow-up Appointments: Return Appointment in 2 weeks. - Dr. Waynette Buttery Rm # 8 Friday 03/13/23 @ 11:00 Other: - Get some antifungal spray (over-the-counter) to use before the wrap goes on at each visit Bathing/ Shower/ Hygiene: May shower with protection but do not get wound dressing(s) wet. Protect dressing(s) with water repellant cover (for example, large plastic bag) or a cast cover and may then take shower. Edema Control - Lymphedema / SCD / Other: Elevate legs to the level of the heart or above for 30  minutes daily and/or when sitting for 3-4 times a day throughout the day. Avoid standing for long periods of time. Home Health: New wound care orders this week; continue Home Health for wound care. May utilize formulary equivalent dressing for wound treatment orders unless otherwise specified. - Antifungal spray to toes. zinc oxide to weeping areas on leg. zinc unna boot. Home health to change 2x a week. apply gauze between toes as well. Other Home Health Orders/Instructions: - [facility] WOUND #2: - Lower Leg Wound Laterality: Left, Circumferential Cleanser: Soap and Water (Home Health) 2 x Per Week/15 Days Discharge Instructions: May shower and wash wound with dial antibacterial soap and water prior to dressing change. Peri-Wound Care: Zinc Oxide Ointment 30g tube 2 x Per Week/15 Days Discharge Instructions: Apply Zinc Oxide to periwound with each dressing change Topical: antifungal spray (Home Health) 2 x Per Week/15 Days Discharge Instructions: apply over the counter antifungal spray at home to the toes. Ketoconazole in cilnic. Com pression Wrap: zinc unna boot (Home Health) 2 x Per Week/15 Days 1. Zinc oxide Unna bootooleft lower extremity 2. Antifungal spray to the toes 3. In office sharp debridement Electronic Signature(s) Signed: 02/27/2023 12:30:48 PM By: Geralyn Corwin DO Entered By: Geralyn Corwin on 02/27/2023 11:37:52 --------------------------------------------------------------------------------  HxROS Details Patient Name: Date of Service: HA Heather [name] 02/27/2023 10:15 A M Medical Record Number: [id number] Patient Account Number: [id number] Date of Birth/Sex: Treating RN: Mar 10, 1918 (89 y.o. F) Primary Care Provider: Richarda Blade Other Clinician: Referring Provider: Treating Provider/Extender: Velna Ochs, Dinah Weeks in Treatment: 7 Cardiovascular Medical History: Positive for: Hypertension; Peripheral Venous  Disease Gastrointestinal Medical History: Past Medical History Notes: laryngopharyngeal reflux disease diverticulosis Musculoskeletal Medical History: Past Medical History Notes: osteoporosis PAULETTA, PICKNEY ([id number]) [id number] Page 7 of 7 carpel tunnel cervical spondylosis DDD Immunizations Pneumococcal Vaccine: Received Pneumococcal Vaccination: Yes Received Pneumococcal Vaccination On or After 60th Birthday: Yes Implantable Devices No devices added Hospitalization / Surgery History Type of Hospitalization/Surgery left mastectomy Family and Social History Never smoker; Marital Status - Widowed; Alcohol Use: Never; Drug Use: No History; Caffeine Use: Never; Financial Concerns: No; Food, Clothing or Shelter Needs: No; Support System Lacking: No Electronic Signature(s) Signed: 02/27/2023 12:30:48 PM By: Geralyn Corwin DO Entered By: Geralyn Corwin on 02/27/2023 11:36:23 -------------------------------------------------------------------------------- SuperBill Details Patient Name: Date of Service: [name], DO RETHEA J. 02/27/2023 Medical Record Number: [id number] Patient Account Number: [id number] Date of Birth/Sex: Treating RN: 1918-06-04 (89 y.o. Heather [name], Heather [name] Primary Care Provider: Richarda Blade Other Clinician: Referring Provider: Treating Provider/Extender: Velna Ochs, Dinah Weeks in Treatment: 7 Diagnosis Coding ICD-10 Codes Code Description [phone] Non-pressure chronic ulcer of other part of left lower leg limited to breakdown of skin I87.312 Chronic venous hypertension (idiopathic) with ulcer of left lower extremity I50.32 Chronic diastolic (congestive) heart failure I10 Essential (primary) hypertension Z85.3 Personal history of malignant neoplasm of breast Facility Procedures : CPT4 Code: [id number] Description: 97597 - DEBRIDE WOUND 1ST 20 SQ CM OR < ICD-10 Diagnosis Description L97.821 Non-pressure  chronic ulcer of other part of left lower leg limited to breakdown o Modifier: f skin Quantity: 1 Physician Procedures : CPT4 Code Description Modifier [id number] - WC PHYS DEBR WO ANESTH 20 SQ CM ICD-10 Diagnosis Description L97.821 Non-pressure chronic ulcer of other part of left lower leg limited to breakdown of skin Quantity: 1 Electronic Signature(s) Signed: 02/27/2023 12:30:48 PM By: Geralyn Corwin DO Entered By: Geralyn Corwin on 02/27/2023 11:38:10

## 2023-03-04 NOTE — Progress Notes (Signed)
FRANKIE, ZITO ([id number]) [id number] Page 1 of 7 Visit Report for 02/27/2023 Arrival Information Details Patient Name: Date of Service: Heather [name] 02/27/2023 10:15 A M Medical Record Number: [id number] Patient Account Number: [id number] Date of Birth/Sex: Treating RN: [birth year] [name] (89 y.o. F) Primary Care Heather [name]: Heather [name] Other Clinician: Referring Heather [name]: Treating Heather [name]/Extender: Heather [name], Heather [name] in Treatment: 7 Visit Information History Since Last Visit Added or deleted any medications: No Patient Arrived: Wheel Chair Any new allergies or adverse reactions: No Arrival Time: 10:36 Had a fall or experienced change in No Accompanied By: son activities of daily living that may affect Transfer Assistance: None risk of falls: Patient Identification Verified: Yes Signs or symptoms of abuse/neglect since last visito No Secondary Verification Process Completed: Yes Hospitalized since last visit: No Patient Requires Transmission-Based Precautions: No Implantable device outside of the clinic excluding No Patient Has Alerts: Yes cellular tissue based products placed in the center Patient Alerts: ABI's:L:1.08 10/23 since last visit: Has Dressing in Place as Prescribed: Yes Pain Present Now: Yes Electronic Signature(s) Signed: 03/04/2023 9:53:02 AM By: Heather [name] Entered By: Heather [name] on 02/27/2023 10:36:55 -------------------------------------------------------------------------------- Compression Therapy Details Patient Name: Date of Service: Heather Dunn, DO Heather J. 02/27/2023 10:15 A M Medical Record Number: [id number] Patient Account Number: [id number] Date of Birth/Sex: Treating RN: [name] (89 y.o. Heather [name] Primary Care Heather [name]: Heather [name] Other Clinician: Referring Heather [name]: Treating Fermina Mishkin/Extender: Heather [name], Heather [name] in Treatment: 7 Compression Therapy  Performed for Wound Assessment: Wound #2 Left,Circumferential Lower Leg Performed By: Clinician Heather Mu, RN Compression Type: Heather [name] Post Procedure Diagnosis Same as Pre-procedure Electronic Signature(s) Signed: 03/03/2023 11:10:16 AM By: Heather Mu RN Entered By: Heather [name] on 02/27/2023 11:25:32 -------------------------------------------------------------------------------- Encounter Discharge Information Details Patient Name: Date of Service: Heather [name], DO Heather J. 02/27/2023 10:15 A M Medical Record Number: [id number] Patient Account Number: [id number] Date of Birth/Sex: Treating RN: [birth year] [name] (89 y.o. Heather [name] Primary Care Darielle Hancher: Heather [name] Other Clinician: Referring Heather [name]: Treating Heather [name]/Extender: Heather [name], Heather [name] in Treatment: 7 Encounter Discharge Information Items Post Procedure Vitals Discharge Condition: Stable Temperature (F): 98.7 Ambulatory Status: Wheelchair Pulse (bpm): 74 Discharge Destination: Home Respiratory Rate (breaths/min): 17 Transportation: Private Auto Blood Pressure (mmHg): 120/80 Accompanied By: son Heather [name] ([id number]) [id number] Page 2 of 7 Schedule Follow-up Appointment: Yes Clinical Summary of Care: Patient Declined Electronic Signature(s) Signed: 03/03/2023 11:10:16 AM By: Heather Mu RN Entered By: Heather [name] on 02/27/2023 11:32:19 -------------------------------------------------------------------------------- Lower Extremity Assessment Details Patient Name: Date of Service: Heather [name], DO Heather J. 02/27/2023 10:15 A M Medical Record Number: [id number] Patient Account Number: [id number] Date of Birth/Sex: Treating RN: [birth year] [name] (89 y.o. F) Primary Care Heather [name]: Heather [name] Other Clinician: Referring Heather [name]: Treating Heather [name]/Extender: Heather [name], Heather [name] in Treatment: 7 Edema  Assessment Assessed: [Left: No] [Right: No] Edema: [Left: Ye] [Right: s] Calf Left: Right: Point of Measurement: 32 cm From Medial Instep 29.5 cm Ankle Left: Right: Point of Measurement: 10 cm From Medial Instep 22 cm Electronic Signature(s) Signed: 02/27/2023 12:27:54 PM By: Heather [name] Entered By: Heather [name] on 02/27/2023 10:52:45 -------------------------------------------------------------------------------- Multi Wound Chart Details Patient Name: Date of Service: Heather Dunn, DO Heather J. 02/27/2023 10:15 A M Medical Record Number: [id number] Patient Account Number: [id number] Date of Birth/Sex: Treating RN: [birth year] [name] (89 y.o. F) Primary Care Heather [name]: Heather [name] Other Clinician: Referring Heather [name]: Treating Heather [name]/Extender: Heather [name], Heather [name] in Treatment: 7 Vital Signs Height(in): Pulse(bpm): 73 Weight(lbs): Blood  Pressure(mmHg): 112/47 Body Mass Index(BMI): Temperature(F): 97.7 Respiratory Rate(breaths/min): 17 [2:Photos:] [N/A:N/A] Left, Circumferential Lower Leg N/A N/A Wound Location: Gradually Appeared N/A N/A Wounding Event: Venous Leg Ulcer N/A N/A Primary Etiology: Hypertension, Peripheral Venous N/A N/A Comorbid History: Disease Heather [name], Heather [name] ([id number]) [id number] Page 3 of 7 01/09/2023 N/A N/A Date Acquired: 7 N/A N/A [name] of Treatment: Open N/A N/A Wound Status: No N/A N/A Wound Recurrence: 22x23x0.1 N/A N/A Measurements L x W x D (cm) 397.411 N/A N/A A (cm) : rea 39.741 N/A N/A Volume (cm) : -24.00% N/A N/A % Reduction in A rea: -24.00% N/A N/A % Reduction in Volume: Full Thickness Without Exposed N/A N/A Classification: Support Structures Medium N/A N/A Exudate A mount: Serosanguineous N/A N/A Exudate Type: red, brown N/A N/A Exudate Color: Distinct, outline attached N/A N/A Wound Margin: Large (67-100%) N/A N/A Granulation A mount: Red, Pink N/A  N/A Granulation Quality: Fascia: No N/A N/A Exposed Structures: Fat Layer (Subcutaneous Tissue): No Tendon: No Muscle: No Joint: No Bone: No Small (1-33%) N/A N/A Epithelialization: Debridement - Selective/Open Wound N/A N/A Debridement: Pre-procedure Verification/Time Out 11:07 N/A N/A Taken: Lidocaine N/A N/A Pain Control: Slough N/A N/A Tissue Debrided: Skin/Epidermis N/A N/A Level: 49 N/A N/A Debridement A (sq cm): rea Curette N/A N/A Instrument: Minimum N/A N/A Bleeding: Pressure N/A N/A Hemostasis A chieved: 0 N/A N/A Procedural Pain: 0 N/A N/A Post Procedural Pain: Procedure was tolerated well N/A N/A Debridement Treatment Response: 22x23x0.1 N/A N/A Post Debridement Measurements L x W x D (cm) 39.741 N/A N/A Post Debridement Volume: (cm) Excoriation: No N/A N/A Periwound Skin Texture: Induration: No Callus: No Crepitus: No Rash: No Scarring: No Maceration: Yes N/A N/A Periwound Skin Moisture: Dry/Scaly: No Hemosiderin Staining: Yes N/A N/A Periwound Skin Color: Atrophie Blanche: No Cyanosis: No Ecchymosis: No Erythema: No Mottled: No Pallor: No Rubor: No No Abnormality N/A N/A Temperature: Yes N/A N/A Tenderness on Palpation: Compression Therapy N/A N/A Procedures Performed: Debridement Treatment Notes Wound #2 (Lower Leg) Wound Laterality: Left, Circumferential Cleanser Soap and Water Discharge Instruction: May shower and wash wound with dial antibacterial soap and water prior to dressing change. Peri-Wound Care Zinc Oxide Ointment 30g tube Discharge Instruction: Apply Zinc Oxide to periwound with each dressing change Topical antifungal spray Discharge Instruction: apply over the counter antifungal spray at home to the toes. Ketoconazole in cilnic. Primary Dressing Secondary Dressing Secured With Compression Heather [name], Heather [name] ([id number]) [id number] Page 4 of 7 zinc unna boot Compression  Stockings Add-Ons Electronic Signature(s) Signed: 02/27/2023 12:30:48 PM By: Heather Corwin DO Entered By: Heather Corwin on 02/27/2023 11:35:23 -------------------------------------------------------------------------------- Multi-Disciplinary Care Plan Details Patient Name: Date of Service: Heather [name], DO Heather J. 02/27/2023 10:15 A M Medical Record Number: [id number] Patient Account Number: [id number] Date of Birth/Sex: Treating RN: [name] (89 y.o. Heather [name], Lauren Primary Care Jamekia Gannett: Heather [name] Other Clinician: Referring Gaberiel Youngblood: Treating Savier Trickett/Extender: Heather [name], Heather [name] in Treatment: 7 Active Inactive Wound/Skin Impairment Nursing Diagnoses: Impaired tissue integrity Knowledge deficit related to ulceration/compromised skin integrity Goals: Patient will have a decrease in wound volume by X% from date: (specify in notes) Date Initiated: 01/09/2023 Target Resolution Date: 03/20/2023 Goal Status: Active Patient/caregiver will verbalize understanding of skin care regimen Date Initiated: 01/09/2023 Target Resolution Date: 03/13/2023 Goal Status: Active Ulcer/skin breakdown will have a volume reduction of 30% by week 4 Date Initiated: 01/09/2023 Target Resolution Date: 03/21/2023 Goal Status: Active Interventions: Assess patient/caregiver ability to obtain necessary supplies Assess patient/caregiver ability to perform ulcer/skin care regimen  upon admission and as needed Assess ulceration(s) every visit Notes: Electronic Signature(s) Signed: 03/03/2023 11:10:16 AM By: Heather Mu RN Entered By: Heather [name] on 02/27/2023 11:09:49 -------------------------------------------------------------------------------- Pain Assessment Details Patient Name: Date of Service: Heather [name], DO Heather J. 02/27/2023 10:15 A M Medical Record Number: [id number] Patient Account Number: [id number] Date of Birth/Sex: Treating RN: [birth year] (89 y.o.  F) Primary Care Wilborn Membreno: Heather [name] Other Clinician: Referring Roberta Kelly: Treating Andy Allende/Extender: Heather [name], Heather [name] in Treatment: 7 Active Problems Location of Pain Severity and Description of Pain Patient Has Paino Yes Site Locations Rate the pain. Heather [name], Heather [name] ([id number]) [id number] Page 5 of 7 Rate the pain. Current Pain Level: 4 Pain Management and Medication Current Pain Management: Electronic Signature(s) Signed: 03/04/2023 9:53:02 AM By: Heather [name] Entered By: Heather [name] on 02/27/2023 10:37:34 -------------------------------------------------------------------------------- Patient/Caregiver Education Details Patient Name: Date of Service: Heather Dunn, DO Heather [name] 4/12/2024andnbsp10:15 A M Medical Record Number: [id number] Patient Account Number: [id number] Date of Birth/Gender: Treating RN: [name] (89 y.o. Heather [name] Primary Care Physician: Heather [name] Other Clinician: Referring Physician: Treating Physician/Extender: Jae Dire in Treatment: 7 Education Assessment Education Provided To: Patient Education Topics Provided Wound/Skin Impairment: Methods: Explain/Verbal Responses: Reinforcements needed, State content correctly Electronic Signature(s) Signed: 03/03/2023 11:10:16 AM By: Heather Mu RN Entered By: Heather [name] on 02/27/2023 11:10:01 -------------------------------------------------------------------------------- Wound Assessment Details Patient Name: Date of Service: Heather [name], DO Heather J. 02/27/2023 10:15 A M Medical Record Number: [id number] Patient Account Number: [id number] Date of Birth/Sex: Treating RN: [birth year] [name] (89 y.o. Heather [name] Primary Care Xiadani Damman: Heather [name] Other Clinician: Referring Jasmine Mcbeth: Treating Kaspar Albornoz/Extender: Heather [name], Heather [name] in Treatment: 7 Wound Status Wound Number:  2 Primary Etiology: Venous Leg Ulcer Wound Location: Left, Circumferential Lower Leg Wound Status: Open Wounding Event: Gradually Appeared Comorbid History: Hypertension, Peripheral Venous Disease Date Acquired: 01/09/2023 [name] Of Treatment: 7 Clustered Wound: No Heather [name], Heather [name] ([id number]) [id number] Page 6 of 7 Photos Wound Measurements Length: (cm) 22 Width: (cm) 23 Depth: (cm) 0.1 Area: (cm) 397.411 Volume: (cm) 39.741 % Reduction in Area: -24% % Reduction in Volume: -24% Epithelialization: Small (1-33%) Tunneling: No Undermining: No Wound Description Classification: Full Thickness Without Exposed Support Structures Wound Margin: Distinct, outline attached Exudate Amount: Medium Exudate Type: Serosanguineous Exudate Color: red, brown Foul Odor After Cleansing: No Slough/Fibrino No Wound Bed Granulation Amount: Large (67-100%) Exposed Structure Granulation Quality: Red, Pink Fascia Exposed: No Fat Layer (Subcutaneous Tissue) Exposed: No Tendon Exposed: No Muscle Exposed: No Joint Exposed: No Bone Exposed: No Periwound Skin Texture Texture Color No Abnormalities Noted: No No Abnormalities Noted: No Callus: No Atrophie Blanche: No Crepitus: No Cyanosis: No Excoriation: No Ecchymosis: No Induration: No Erythema: No Rash: No Hemosiderin Staining: Yes Scarring: No Mottled: No Pallor: No Moisture Rubor: No No Abnormalities Noted: No Dry / Scaly: No Temperature / Pain Maceration: Yes Temperature: No Abnormality Tenderness on Palpation: Yes Treatment Notes Wound #2 (Lower Leg) Wound Laterality: Left, Circumferential Cleanser Soap and Water Discharge Instruction: May shower and wash wound with dial antibacterial soap and water prior to dressing change. Peri-Wound Care Zinc Oxide Ointment 30g tube Discharge Instruction: Apply Zinc Oxide to periwound with each dressing change Topical antifungal spray Discharge  Instruction: apply over the counter antifungal spray at home to the toes. Ketoconazole in cilnic. Primary Dressing Secondary Dressing Secured With Heather [name], Heather [name] ([id number]) [id number] Page 7 of 7 Compression Wrap zinc unna boot Compression Stockings Add-Ons Electronic Signature(s) Signed: 02/27/2023 12:27:54 PM By: Heather [name]  Signed: 02/27/2023 3:38:47 PM By: Shawn Stall RN, BSN Entered By: Heather [name] on 02/27/2023 10:51:19 -------------------------------------------------------------------------------- Vitals Details Patient Name: Date of Service: Heather [name], DO Heather J. 02/27/2023 10:15 A M Medical Record Number: [id number] Patient Account Number: [id number] Date of Birth/Sex: Treating RN: [birth year] (89 y.o. F) Primary Care Elizabeth Haff: Heather [name] Other Clinician: Referring Heather [name]: Treating Mekhi Lascola/Extender: Heather [name], Heather [name] in Treatment: 7 Vital Signs Time Taken: 10:36 Temperature (F): 97.7 Pulse (bpm): 73 Respiratory Rate (breaths/min): 17 Blood Pressure (mmHg): 112/47 Reference Range: 80 - 120 mg / dl Electronic Signature(s) Signed: 03/04/2023 9:53:02 AM By: Heather [name] Entered By: Heather [name] on 02/27/2023 10:37:26

## 2023-03-05 DIAGNOSIS — L97521 Non-pressure chronic ulcer of other part of left foot limited to breakdown of skin: Secondary | ICD-10-CM | POA: Diagnosis not present

## 2023-03-05 DIAGNOSIS — J309 Allergic rhinitis, unspecified: Secondary | ICD-10-CM | POA: Diagnosis not present

## 2023-03-05 DIAGNOSIS — L97821 Non-pressure chronic ulcer of other part of left lower leg limited to breakdown of skin: Secondary | ICD-10-CM | POA: Diagnosis not present

## 2023-03-05 DIAGNOSIS — F0284 Dementia in other diseases classified elsewhere, unspecified severity, with anxiety: Secondary | ICD-10-CM | POA: Diagnosis not present

## 2023-03-05 DIAGNOSIS — G301 Alzheimer's disease with late onset: Secondary | ICD-10-CM | POA: Diagnosis not present

## 2023-03-05 DIAGNOSIS — I110 Hypertensive heart disease with heart failure: Secondary | ICD-10-CM | POA: Diagnosis not present

## 2023-03-05 DIAGNOSIS — I87312 Chronic venous hypertension (idiopathic) with ulcer of left lower extremity: Secondary | ICD-10-CM | POA: Diagnosis not present

## 2023-03-05 DIAGNOSIS — M810 Age-related osteoporosis without current pathological fracture: Secondary | ICD-10-CM | POA: Diagnosis not present

## 2023-03-05 DIAGNOSIS — I4510 Unspecified right bundle-branch block: Secondary | ICD-10-CM | POA: Diagnosis not present

## 2023-03-05 DIAGNOSIS — I5032 Chronic diastolic (congestive) heart failure: Secondary | ICD-10-CM | POA: Diagnosis not present

## 2023-03-05 DIAGNOSIS — K5900 Constipation, unspecified: Secondary | ICD-10-CM | POA: Diagnosis not present

## 2023-03-05 DIAGNOSIS — M47812 Spondylosis without myelopathy or radiculopathy, cervical region: Secondary | ICD-10-CM | POA: Diagnosis not present

## 2023-03-05 DIAGNOSIS — G894 Chronic pain syndrome: Secondary | ICD-10-CM | POA: Diagnosis not present

## 2023-03-05 DIAGNOSIS — K219 Gastro-esophageal reflux disease without esophagitis: Secondary | ICD-10-CM | POA: Diagnosis not present

## 2023-03-05 DIAGNOSIS — F411 Generalized anxiety disorder: Secondary | ICD-10-CM | POA: Diagnosis not present

## 2023-03-05 DIAGNOSIS — K5730 Diverticulosis of large intestine without perforation or abscess without bleeding: Secondary | ICD-10-CM | POA: Diagnosis not present

## 2023-03-05 NOTE — Progress Notes (Signed)
NAREH, MATZKE ([id number]) [id number] Page 1 of 9 Visit Report for 01/23/2023 Arrival Information Details Patient Name: Date of Service: Heather [name] 01/23/2023 11:00 A M Medical Record Number: [id number] Patient Account Number: [id number] Date of Birth/Sex: Treating RN: 1918-09-12 (89 y.o. Gevena Mart Primary Care Billi Bright: Richarda Blade Other Clinician: Referring Celes Dedic: Treating Zollie Clemence/Extender: Geralyn Corwin Ngetich, Dinah Weeks in Treatment: 2 Visit Information History Since Last Visit All ordered tests and consults were completed: Yes Patient Arrived: Wheel Chair Added or deleted any medications: No Arrival Time: 11:18 Any new allergies or adverse reactions: No Accompanied By: husband Had a fall or experienced change in No Transfer Assistance: EasyPivot Patient Lift activities of daily living that may affect Patient Identification Verified: Yes risk of falls: Secondary Verification Process Completed: Yes Signs or symptoms of abuse/neglect since last visito No Patient Requires Transmission-Based Precautions: No Hospitalized since last visit: No Patient Has Alerts: Yes Implantable device outside of the clinic excluding No Patient Alerts: ABI's:L:1.08 10/23 cellular tissue based products placed in the center since last visit: Has Dressing in Place as Prescribed: Yes Has Compression in Place as Prescribed: Yes Pain Present Now: No Electronic Signature(s) Signed: 03/05/2023 1:55:51 PM By: Brenton Grills Entered By: Brenton Grills on 01/23/2023 11:22:01 -------------------------------------------------------------------------------- Clinic Level of Care Assessment Details Patient Name: Date of Service: Heather [name] 01/23/2023 11:00 A M Medical Record Number: [id number] Patient Account Number: [id number] Date of Birth/Sex: Treating RN: 1918/02/18 (89 y.o. Ardis Rowan, Lauren Primary Care Reni Hausner: Richarda Blade Other  Clinician: Referring Haeden Hudock: Treating Theordore Cisnero/Extender: Geralyn Corwin Ngetich, Dinah Weeks in Treatment: 2 Clinic Level of Care Assessment Items TOOL 4 Quantity Score X- 1 0 Use when only an EandM is performed on FOLLOW-UP visit ASSESSMENTS - Nursing Assessment / Reassessment X- 1 10 Reassessment of Co-morbidities (includes updates in patient status) X- 1 5 Reassessment of Adherence to Treatment Plan ASSESSMENTS - Wound and Skin A ssessment / Reassessment X - Simple Wound Assessment / Reassessment - one wound 1 5 []  - 0 Complex Wound Assessment / Reassessment - multiple wounds []  - 0 Dermatologic / Skin Assessment (not related to wound area) ASSESSMENTS - Focused Assessment []  - 0 Circumferential Edema Measurements - multi extremities []  - 0 Nutritional Assessment / Counseling / Intervention []  - 0 Lower Extremity Assessment (monofilament, tuning fork, pulses) []  - 0 Peripheral Arterial Disease Assessment (using hand held doppler) ASSESSMENTS - Ostomy and/or Continence Assessment and Care []  - 0 Incontinence Assessment and Management []  - 0 Ostomy Care Assessment and Management (repouching, etc.) Heather [name], Heather [name] ([id number]) [id number] Page 2 of 9 PROCESS - Coordination of Care X - Simple Patient / Family Education for ongoing care 1 15 []  - 0 Complex (extensive) Patient / Family Education for ongoing care X- 1 10 Staff obtains Chiropractor, Records, T Results / Process Orders est []  - 0 Staff telephones HHA, Nursing Homes / Clarify orders / etc []  - 0 Routine Transfer to another Facility (non-emergent condition) []  - 0 Routine Hospital Admission (non-emergent condition) X- 1 15 New Admissions / Manufacturing engineer / Ordering NPWT Apligraf, etc. , []  - 0 Emergency Hospital Admission (emergent condition) X- 1 10 Simple Discharge Coordination []  - 0 Complex (extensive) Discharge Coordination PROCESS - Special Needs []  -  0 Pediatric / Minor Patient Management []  - 0 Isolation Patient Management []  - 0 Hearing / Language / Visual special needs []  - 0 Assessment of Community assistance (transportation, D/C planning, etc.) []  - 0 Additional assistance /  Altered mentation  - 0 Support Surface(s) Assessment (bed, cushion, seat, etc.) INTERVENTIONS - Wound Cleansing / Measurement X - Simple Wound Cleansing - one wound 1 5  - 0 Complex Wound Cleansing - multiple wounds X- 1 5 Wound Imaging (photographs - any number of wounds)  - 0 Wound Tracing (instead of photographs) X- 1 5 Simple Wound Measurement - one wound  - 0 Complex Wound Measurement - multiple wounds INTERVENTIONS - Wound Dressings X - Small Wound Dressing one or multiple wounds 1 10  - 0 Medium Wound Dressing one or multiple wounds  - 0 Large Wound Dressing one or multiple wounds  - 0 Application of Medications - topical  - 0 Application of Medications - injection INTERVENTIONS - Miscellaneous  - 0 External ear exam  - 0 Specimen Collection (cultures, biopsies, blood, body fluids, etc.)  - 0 Specimen(s) / Culture(s) sent or taken to Lab for analysis  - 0 Patient Transfer (multiple staff / Nurse, adult / Similar devices)  - 0 Simple Staple / Suture removal (25 or less)  - 0 Complex Staple / Suture removal (26 or more)  - 0 Hypo / Hyperglycemic Management (close monitor of Blood Glucose)  - 0 Ankle / Brachial Index (ABI) - do not check if billed separately X- 1 5 Vital Signs Has the patient been seen at the hospital within the last three years: Yes Total Score: 100 Level Of Care: New/Established - Level 3 Electronic Signature(s) Signed: 01/28/2023 4:00:04 PM By: Fonnie Mu RN Tiburcio Pea, Shakeyla J ([id number]) By: Fonnie Mu RN [phone].pdf Page 3 of 9 Signed: 01/28/2023 4:00:04 PM Entered By: Fonnie Mu on 01/23/2023  11:32:55 -------------------------------------------------------------------------------- Encounter Discharge Information Details Patient Name: Date of Service: Heather [name]. 01/23/2023 11:00 A M Medical Record Number: [id number] Patient Account Number: [id number] Date of Birth/Sex: Treating RN: Aug 24, 1918 (89 y.o. Toniann Fail Primary Care Meline Russaw: Richarda Blade Other Clinician: Referring Zosia Lucchese: Treating Preethi Scantlebury/Extender: Jae Dire in Treatment: 2 Encounter Discharge Information Items Discharge Condition: Stable Ambulatory Status: Wheelchair Discharge Destination: Home Transportation: Private Auto Accompanied By: son Schedule Follow-up Appointment: Yes Clinical Summary of Care: Patient Declined Electronic Signature(s) Signed: 01/28/2023 4:00:04 PM By: Fonnie Mu RN Entered By: Fonnie Mu on 01/23/2023 11:51:25 -------------------------------------------------------------------------------- Lower Extremity Assessment Details Patient Name: Date of Service: Heather [name], DO Heather J. 01/23/2023 11:00 A M Medical Record Number: [id number] Patient Account Number: [id number] Date of Birth/Sex: Treating RN: 12/28/1917 (89 y.o. Gevena Mart Primary Care Elizah Lydon: Richarda Blade Other Clinician: Referring Shanyce Daris: Treating Debbie Yearick/Extender: Geralyn Corwin Ngetich, Dinah Weeks in Treatment: 2 Edema Assessment Assessed: [Left: Yes] [Right: No] Edema: [Left: Ye] [Right: s] Calf Left: Right: Point of Measurement: 32 cm From Medial Instep 29 cm Ankle Left: Right: Point of Measurement: 10 cm From Medial Instep 25 cm Vascular Assessment Pulses: Dorsalis Pedis Palpable: [Left:Yes] Electronic Signature(s) Signed: 03/05/2023 1:55:51 PM By: Brenton Grills Entered By: Brenton Grills on 01/23/2023 11:22:14 -------------------------------------------------------------------------------- Multi Wound Chart Details Patient Name:  Date of Service: Miquel Dunn, DO Heather J. 01/23/2023 11:00 A M Medical Record Number: [id number] Patient Account Number: [id number] Date of Birth/Sex: Treating RN: [birth year] (89 y.o. F) Primary Care Carlis Burnsworth: Richarda Blade Other Clinician: LATESE, DUFAULT ([id number]) [id number] Page 4 of 9 Referring Keelyn Monjaras: Treating Dewitte Vannice/Extender: Geralyn Corwin Ngetich, Dinah Weeks in Treatment: 2 Vital Signs Height(in): Pulse(bpm): 67 Weight(lbs): Blood Pressure(mmHg): 117/61 Body Mass Index(BMI): Temperature(F): 97.9 Respiratory Rate(breaths/min): 20 [2:Photos: No Photos] [N/A:N/A] Left, Circumferential Lower Leg Left, Circumferential Lower Leg N/A  Wound Location: Gradually Appeared Gradually Appeared N/A Wounding Event: Venous Leg Ulcer Venous Leg Ulcer N/A Primary Etiology: Hypertension, Peripheral Venous Hypertension, Peripheral Venous N/A Comorbid History: Disease Disease 01/09/2023 01/09/2023 N/A Date Acquired: 2 2 N/A Weeks of Treatment: Open Open N/A Wound Status: No No N/A Wound Recurrence: 17.1x24.2x0.1 17.1x24.2x0.1 N/A Measurements L x W x D (cm) 325.013 325.013 N/A A (cm) : rea 32.501 32.501 N/A Volume (cm) : -1.40% -1.40% N/A % Reduction in Area: -1.40% -1.40% N/A % Reduction in Volume: Full Thickness Without Exposed Full Thickness Without Exposed N/A Classification: Support Structures Support Structures Medium Medium N/A Exudate Amount: Serosanguineous Serosanguineous N/A Exudate Type: red, brown red, brown N/A Exudate Color: Distinct, outline attached Distinct, outline attached N/A Wound Margin: Large (67-100%) Large (67-100%) N/A Granulation Amount: Red, Pink Red, Pink N/A Granulation Quality: Fascia: No Fascia: No N/A Exposed Structures: Fat Layer (Subcutaneous Tissue): No Fat Layer (Subcutaneous Tissue): No Tendon: No Tendon: No Muscle: No Muscle: No Joint: No Joint: No Bone: No Bone: No Small (1-33%)  Small (1-33%) N/A Epithelialization: Excoriation: No Excoriation: No N/A Periwound Skin Texture: Induration: No Induration: No Callus: No Callus: No Crepitus: No Crepitus: No Rash: No Rash: No Scarring: No Scarring: No Maceration: No Maceration: No N/A Periwound Skin Moisture: Dry/Scaly: No Dry/Scaly: No Hemosiderin Staining: Yes Hemosiderin Staining: Yes N/A Periwound Skin Color: Atrophie Blanche: No Atrophie Blanche: No Cyanosis: No Cyanosis: No Ecchymosis: No Ecchymosis: No Erythema: No Erythema: No Mottled: No Mottled: No Pallor: No Pallor: No Rubor: No Rubor: No No Abnormality No Abnormality N/A Temperature: Yes Yes N/A Tenderness on Palpation: Treatment Notes Wound #2 (Lower Leg) Wound Laterality: Left, Circumferential Cleanser Soap and Water Discharge Instruction: May shower and wash wound with dial antibacterial soap and water prior to dressing change. Peri-Wound Care [address], [place] J ([id number]) [id number] Page 5 of 9 Primary Dressing [employer] Dressing, 4x4 (in/in) Discharge Instruction: Apply to wound bed as instructed Secondary Dressing ABD Pad, 5x9 Discharge Instruction: Apply over primary dressing as directed. Secured With Compression Wrap Kerlix Roll 4.5x3.1 (in/yd) Discharge Instruction: Apply Kerlix and Coban compression as directed. Coban Self-Adherent Wrap 4x5 (in/yd) Discharge Instruction: Apply over Kerlix as directed. Compression Stockings Add-Ons Electronic Signature(s) Signed: 01/23/2023 12:19:22 PM By: Geralyn Corwin DO Entered By: Geralyn Corwin on 01/23/2023 11:50:27 -------------------------------------------------------------------------------- Multi-Disciplinary Care Plan Details Patient Name: Date of Service: Heather [name], DO Heather J. 01/23/2023 11:00 A M Medical Record Number: [id number] Patient Account Number: [id number] Date of Birth/Sex: Treating RN: [birth year] (89  y.o. Ardis Rowan, Lauren Primary Care Imya Mance: Richarda Blade Other Clinician: Referring Sherlynn Tourville: Treating Adria Costley/Extender: Geralyn Corwin Ngetich, Dinah Weeks in Treatment: 2 Active Inactive Wound/Skin Impairment Nursing Diagnoses: Impaired tissue integrity Knowledge deficit related to ulceration/compromised skin integrity Goals: Patient will have a decrease in wound volume by X% from date: (specify in notes) Date Initiated: 01/09/2023 Target Resolution Date: 02/14/2023 Goal Status: Active Patient/caregiver will verbalize understanding of skin care regimen Date Initiated: 01/09/2023 Target Resolution Date: 01/24/2023 Goal Status: Active Ulcer/skin breakdown will have a volume reduction of 30% by week 4 Date Initiated: 01/09/2023 Target Resolution Date: 01/24/2023 Goal Status: Active Interventions: Assess patient/caregiver ability to obtain necessary supplies Assess patient/caregiver ability to perform ulcer/skin care regimen upon admission and as needed Assess ulceration(s) every visit Notes: Electronic Signature(s) Signed: 01/28/2023 4:00:04 PM By: Fonnie Mu RN Entered By: Fonnie Mu on 01/23/2023 11:31:11 Pain Assessment Details -------------------------------------------------------------------------------- [name] ([id number]) [id number] Page 6 of 9 Patient Name: Date of Service: Heather [name], DO Heather J.  01/23/2023 11:00 A M Medical Record Number: [id number] Patient Account Number: [id number] Date of Birth/Sex: Treating RN: 11-22-1917 (89 y.o. Gevena Mart Primary Care Aime Meloche: Richarda Blade Other Clinician: Referring Christopher Glasscock: Treating Kit Mollett/Extender: Geralyn Corwin Ngetich, Dinah Weeks in Treatment: 2 Active Problems Location of Pain Severity and Description of Pain Patient Has Paino No Site Locations Pain Management and Medication Current Pain Management: Electronic Signature(s) Signed: 03/05/2023  1:55:51 PM By: Brenton Grills Entered By: Brenton Grills on 01/23/2023 11:22:38 -------------------------------------------------------------------------------- Patient/Caregiver Education Details Patient Name: Date of Service: Heather [name], DO Heather J. 3/8/2024andnbsp11:00 A M Medical Record Number: [id number] Patient Account Number: [id number] Date of Birth/Gender: Treating RN: 15-Feb-1918 (89 y.o. Toniann Fail Primary Care Physician: Richarda Blade Other Clinician: Referring Physician: Treating Physician/Extender: Jae Dire in Treatment: 2 Education Assessment Education Provided To: Patient Education Topics Provided Wound/Skin Impairment: Methods: Explain/Verbal Responses: Reinforcements needed, State content correctly Electronic Signature(s) Signed: 01/28/2023 4:00:04 PM By: Fonnie Mu RN Entered By: Fonnie Mu on 01/23/2023 11:31:24 -------------------------------------------------------------------------------- Wound Assessment Details Patient Name: Date of Service: Heather [name], DO Heather J. 01/23/2023 11:00 A Heather [name], Heather [name] ([id number]) [id number] Page 7 of 9 Medical Record Number: [id number] Patient Account Number: [id number] Date of Birth/Sex: Treating RN: 1917-12-01 (89 y.o. Gevena Mart Primary Care Brinlynn Gorton: Richarda Blade Other Clinician: Referring Bolden Hagerman: Treating Temima Kutsch/Extender: Geralyn Corwin Ngetich, Dinah Weeks in Treatment: 2 Wound Status Wound Number: 2 Primary Etiology: Venous Leg Ulcer Wound Location: Left, Circumferential Lower Leg Wound Status: Open Wounding Event: Gradually Appeared Comorbid History: Hypertension, Peripheral Venous Disease Date Acquired: 01/09/2023 Weeks Of Treatment: 2 Clustered Wound: No Wound Measurements Length: (cm) 17.1 Width: (cm) 24.2 Depth: (cm) 0.1 Area: (cm) 325.013 Volume: (cm) 32.501 % Reduction in Area: -1.4% % Reduction in  Volume: -1.4% Epithelialization: Small (1-33%) Wound Description Classification: Full Thickness Without Exposed Support Structures Wound Margin: Distinct, outline attached Exudate Amount: Medium Exudate Type: Serosanguineous Exudate Color: red, brown Foul Odor After Cleansing: No Slough/Fibrino No Wound Bed Granulation Amount: Large (67-100%) Exposed Structure Granulation Quality: Red, Pink Fascia Exposed: No Fat Layer (Subcutaneous Tissue) Exposed: No Tendon Exposed: No Muscle Exposed: No Joint Exposed: No Bone Exposed: No Periwound Skin Texture Texture Color No Abnormalities Noted: No No Abnormalities Noted: No Callus: No Atrophie Blanche: No Crepitus: No Cyanosis: No Excoriation: No Ecchymosis: No Induration: No Erythema: No Rash: No Hemosiderin Staining: Yes Scarring: No Mottled: No Pallor: No Moisture Rubor: No No Abnormalities Noted: No Dry / Scaly: No Temperature / Pain Maceration: No Temperature: No Abnormality Tenderness on Palpation: Yes Electronic Signature(s) Signed: 03/05/2023 1:55:51 PM By: Brenton Grills Entered By: Brenton Grills on 01/23/2023 11:27:24 -------------------------------------------------------------------------------- Wound Assessment Details Patient Name: Date of Service: Miquel Dunn, DO Heather J. 01/23/2023 11:00 A M Medical Record Number: [id number] Patient Account Number: [id number] Date of Birth/Sex: Treating RN: [birth year] (89 y.o. Gevena Mart Primary Care Vitalia Stough: Richarda Blade Other Clinician: Referring Alohilani Levenhagen: Treating Mariluz Crespo/Extender: Geralyn Corwin Ngetich, Dinah Weeks in Treatment: 2 Wound Status Wound Number: 2 Primary Etiology: Venous Leg Ulcer Wound Location: Left, Circumferential Lower Leg Wound Status: Open Wounding Event: Gradually Appeared Comorbid History: Hypertension, Peripheral Venous Disease Date Acquired: 01/09/2023 AMILEY, SHISHIDO ([id number]) [id number].pdf Page 8  of 9 Weeks Of Treatment: 2 Clustered Wound: No Photos Wound Measurements Length: (cm) 17.1 Width: (cm) 24.2 Depth: (cm) 0.1 Area: (cm) 325.013 Volume: (cm) 32.501 % Reduction in Area: -1.4% % Reduction in Volume: -1.4% Epithelialization: Small (1-33%) Wound Description Classification: Full Thickness Without Exposed Support Structures Wound Margin: Distinct,  outline attached Exudate Amount: Medium Exudate Type: Serosanguineous Exudate Color: red, brown Foul Odor After Cleansing: No Slough/Fibrino No Wound Bed Granulation Amount: Large (67-100%) Exposed Structure Granulation Quality: Red, Pink Fascia Exposed: No Fat Layer (Subcutaneous Tissue) Exposed: No Tendon Exposed: No Muscle Exposed: No Joint Exposed: No Bone Exposed: No Periwound Skin Texture Texture Color No Abnormalities Noted: No No Abnormalities Noted: No Callus: No Atrophie Blanche: No Crepitus: No Cyanosis: No Excoriation: No Ecchymosis: No Induration: No Erythema: No Rash: No Hemosiderin Staining: Yes Scarring: No Mottled: No Pallor: No Moisture Rubor: No No Abnormalities Noted: No Dry / Scaly: No Temperature / Pain Maceration: No Temperature: No Abnormality Tenderness on Palpation: Yes Electronic Signature(s) Signed: 03/05/2023 1:55:51 PM By: Brenton Grills Entered By: Brenton Grills on 01/23/2023 11:28:21 -------------------------------------------------------------------------------- Vitals Details Patient Name: Date of Service: Heather [name], DO Heather J. 01/23/2023 11:00 A M Medical Record Number: [id number] Patient Account Number: [id number] Date of Birth/Sex: Treating RN: 1918-08-13 (89 y.o. Gevena Mart Primary Care Arlena Marsan: Richarda Blade Other Clinician: Referring Evadne Ose: Treating Laelah Siravo/Extender: Velna Ochs, Dinah Weeks in Treatment: 2 Vital [address] SARAY, CAPASSO ([id number]) [id number] Page 9 of 9 Time Taken: 11:22 Temperature  (F): 97.9 Pulse (bpm): 67 Respiratory Rate (breaths/min): 20 Blood Pressure (mmHg): 117/61 Reference Range: 80 - 120 mg / dl Electronic Signature(s) Signed: 03/05/2023 1:55:51 PM By: Brenton Grills Entered By: Brenton Grills on 01/23/2023 11:22:33

## 2023-03-09 DIAGNOSIS — G301 Alzheimer's disease with late onset: Secondary | ICD-10-CM | POA: Diagnosis not present

## 2023-03-09 DIAGNOSIS — M47812 Spondylosis without myelopathy or radiculopathy, cervical region: Secondary | ICD-10-CM | POA: Diagnosis not present

## 2023-03-09 DIAGNOSIS — F0284 Dementia in other diseases classified elsewhere, unspecified severity, with anxiety: Secondary | ICD-10-CM | POA: Diagnosis not present

## 2023-03-09 DIAGNOSIS — G894 Chronic pain syndrome: Secondary | ICD-10-CM | POA: Diagnosis not present

## 2023-03-09 DIAGNOSIS — I5032 Chronic diastolic (congestive) heart failure: Secondary | ICD-10-CM | POA: Diagnosis not present

## 2023-03-09 DIAGNOSIS — F411 Generalized anxiety disorder: Secondary | ICD-10-CM | POA: Diagnosis not present

## 2023-03-09 DIAGNOSIS — M810 Age-related osteoporosis without current pathological fracture: Secondary | ICD-10-CM | POA: Diagnosis not present

## 2023-03-09 DIAGNOSIS — L97821 Non-pressure chronic ulcer of other part of left lower leg limited to breakdown of skin: Secondary | ICD-10-CM | POA: Diagnosis not present

## 2023-03-09 DIAGNOSIS — I110 Hypertensive heart disease with heart failure: Secondary | ICD-10-CM | POA: Diagnosis not present

## 2023-03-09 DIAGNOSIS — K5900 Constipation, unspecified: Secondary | ICD-10-CM | POA: Diagnosis not present

## 2023-03-09 DIAGNOSIS — K5730 Diverticulosis of large intestine without perforation or abscess without bleeding: Secondary | ICD-10-CM | POA: Diagnosis not present

## 2023-03-09 DIAGNOSIS — I87312 Chronic venous hypertension (idiopathic) with ulcer of left lower extremity: Secondary | ICD-10-CM | POA: Diagnosis not present

## 2023-03-09 DIAGNOSIS — L97521 Non-pressure chronic ulcer of other part of left foot limited to breakdown of skin: Secondary | ICD-10-CM | POA: Diagnosis not present

## 2023-03-09 DIAGNOSIS — K219 Gastro-esophageal reflux disease without esophagitis: Secondary | ICD-10-CM | POA: Diagnosis not present

## 2023-03-09 DIAGNOSIS — J309 Allergic rhinitis, unspecified: Secondary | ICD-10-CM | POA: Diagnosis not present

## 2023-03-09 DIAGNOSIS — I4510 Unspecified right bundle-branch block: Secondary | ICD-10-CM | POA: Diagnosis not present

## 2023-03-10 DIAGNOSIS — G894 Chronic pain syndrome: Secondary | ICD-10-CM | POA: Diagnosis not present

## 2023-03-10 DIAGNOSIS — L97521 Non-pressure chronic ulcer of other part of left foot limited to breakdown of skin: Secondary | ICD-10-CM | POA: Diagnosis not present

## 2023-03-10 DIAGNOSIS — F0284 Dementia in other diseases classified elsewhere, unspecified severity, with anxiety: Secondary | ICD-10-CM | POA: Diagnosis not present

## 2023-03-10 DIAGNOSIS — I5032 Chronic diastolic (congestive) heart failure: Secondary | ICD-10-CM | POA: Diagnosis not present

## 2023-03-10 DIAGNOSIS — K5730 Diverticulosis of large intestine without perforation or abscess without bleeding: Secondary | ICD-10-CM | POA: Diagnosis not present

## 2023-03-10 DIAGNOSIS — H01005 Unspecified blepharitis left lower eyelid: Secondary | ICD-10-CM | POA: Diagnosis not present

## 2023-03-10 DIAGNOSIS — I110 Hypertensive heart disease with heart failure: Secondary | ICD-10-CM | POA: Diagnosis not present

## 2023-03-10 DIAGNOSIS — J309 Allergic rhinitis, unspecified: Secondary | ICD-10-CM | POA: Diagnosis not present

## 2023-03-10 DIAGNOSIS — F411 Generalized anxiety disorder: Secondary | ICD-10-CM | POA: Diagnosis not present

## 2023-03-10 DIAGNOSIS — I87312 Chronic venous hypertension (idiopathic) with ulcer of left lower extremity: Secondary | ICD-10-CM | POA: Diagnosis not present

## 2023-03-10 DIAGNOSIS — L97821 Non-pressure chronic ulcer of other part of left lower leg limited to breakdown of skin: Secondary | ICD-10-CM | POA: Diagnosis not present

## 2023-03-10 DIAGNOSIS — H01002 Unspecified blepharitis right lower eyelid: Secondary | ICD-10-CM | POA: Diagnosis not present

## 2023-03-10 DIAGNOSIS — Z741 Need for assistance with personal care: Secondary | ICD-10-CM | POA: Diagnosis not present

## 2023-03-10 DIAGNOSIS — Z9181 History of falling: Secondary | ICD-10-CM | POA: Diagnosis not present

## 2023-03-10 DIAGNOSIS — K5900 Constipation, unspecified: Secondary | ICD-10-CM | POA: Diagnosis not present

## 2023-03-10 DIAGNOSIS — M47812 Spondylosis without myelopathy or radiculopathy, cervical region: Secondary | ICD-10-CM | POA: Diagnosis not present

## 2023-03-10 DIAGNOSIS — I4510 Unspecified right bundle-branch block: Secondary | ICD-10-CM | POA: Diagnosis not present

## 2023-03-10 DIAGNOSIS — K449 Diaphragmatic hernia without obstruction or gangrene: Secondary | ICD-10-CM | POA: Diagnosis not present

## 2023-03-10 DIAGNOSIS — M810 Age-related osteoporosis without current pathological fracture: Secondary | ICD-10-CM | POA: Diagnosis not present

## 2023-03-10 DIAGNOSIS — K219 Gastro-esophageal reflux disease without esophagitis: Secondary | ICD-10-CM | POA: Diagnosis not present

## 2023-03-10 DIAGNOSIS — Z556 Problems related to health literacy: Secondary | ICD-10-CM | POA: Diagnosis not present

## 2023-03-10 DIAGNOSIS — G301 Alzheimer's disease with late onset: Secondary | ICD-10-CM | POA: Diagnosis not present

## 2023-03-10 DIAGNOSIS — Z853 Personal history of malignant neoplasm of breast: Secondary | ICD-10-CM | POA: Diagnosis not present

## 2023-03-11 DIAGNOSIS — I5032 Chronic diastolic (congestive) heart failure: Secondary | ICD-10-CM | POA: Diagnosis not present

## 2023-03-11 DIAGNOSIS — G894 Chronic pain syndrome: Secondary | ICD-10-CM | POA: Diagnosis not present

## 2023-03-11 DIAGNOSIS — M47812 Spondylosis without myelopathy or radiculopathy, cervical region: Secondary | ICD-10-CM | POA: Diagnosis not present

## 2023-03-11 DIAGNOSIS — M810 Age-related osteoporosis without current pathological fracture: Secondary | ICD-10-CM | POA: Diagnosis not present

## 2023-03-11 DIAGNOSIS — F0284 Dementia in other diseases classified elsewhere, unspecified severity, with anxiety: Secondary | ICD-10-CM | POA: Diagnosis not present

## 2023-03-11 DIAGNOSIS — G301 Alzheimer's disease with late onset: Secondary | ICD-10-CM | POA: Diagnosis not present

## 2023-03-11 DIAGNOSIS — K5730 Diverticulosis of large intestine without perforation or abscess without bleeding: Secondary | ICD-10-CM | POA: Diagnosis not present

## 2023-03-11 DIAGNOSIS — I110 Hypertensive heart disease with heart failure: Secondary | ICD-10-CM | POA: Diagnosis not present

## 2023-03-11 DIAGNOSIS — I4510 Unspecified right bundle-branch block: Secondary | ICD-10-CM | POA: Diagnosis not present

## 2023-03-11 DIAGNOSIS — J309 Allergic rhinitis, unspecified: Secondary | ICD-10-CM | POA: Diagnosis not present

## 2023-03-11 DIAGNOSIS — L97821 Non-pressure chronic ulcer of other part of left lower leg limited to breakdown of skin: Secondary | ICD-10-CM | POA: Diagnosis not present

## 2023-03-11 DIAGNOSIS — K219 Gastro-esophageal reflux disease without esophagitis: Secondary | ICD-10-CM | POA: Diagnosis not present

## 2023-03-11 DIAGNOSIS — K5900 Constipation, unspecified: Secondary | ICD-10-CM | POA: Diagnosis not present

## 2023-03-11 DIAGNOSIS — I87312 Chronic venous hypertension (idiopathic) with ulcer of left lower extremity: Secondary | ICD-10-CM | POA: Diagnosis not present

## 2023-03-11 DIAGNOSIS — F411 Generalized anxiety disorder: Secondary | ICD-10-CM | POA: Diagnosis not present

## 2023-03-11 DIAGNOSIS — L97521 Non-pressure chronic ulcer of other part of left foot limited to breakdown of skin: Secondary | ICD-10-CM | POA: Diagnosis not present

## 2023-03-13 ENCOUNTER — Encounter (HOSPITAL_BASED_OUTPATIENT_CLINIC_OR_DEPARTMENT_OTHER): Payer: Medicare Other | Admitting: Internal Medicine

## 2023-03-13 DIAGNOSIS — L97821 Non-pressure chronic ulcer of other part of left lower leg limited to breakdown of skin: Secondary | ICD-10-CM | POA: Diagnosis not present

## 2023-03-13 DIAGNOSIS — I87312 Chronic venous hypertension (idiopathic) with ulcer of left lower extremity: Secondary | ICD-10-CM

## 2023-03-13 DIAGNOSIS — Z853 Personal history of malignant neoplasm of breast: Secondary | ICD-10-CM | POA: Diagnosis not present

## 2023-03-13 DIAGNOSIS — I5032 Chronic diastolic (congestive) heart failure: Secondary | ICD-10-CM

## 2023-03-13 DIAGNOSIS — I10 Essential (primary) hypertension: Secondary | ICD-10-CM | POA: Diagnosis not present

## 2023-03-13 DIAGNOSIS — I110 Hypertensive heart disease with heart failure: Secondary | ICD-10-CM | POA: Diagnosis not present

## 2023-03-13 DIAGNOSIS — F0390 Unspecified dementia without behavioral disturbance: Secondary | ICD-10-CM | POA: Diagnosis not present

## 2023-03-16 NOTE — Progress Notes (Signed)
ITSEL, OPFER ([id number]) [id number] Page 1 of 7 Visit Report for 03/13/2023 Chief Complaint Document Details Patient Name: Date of Service: Heather [name] 03/13/2023 11:00 A M Medical Record Number: [id number] Patient Account Number: [id number] Date of Birth/Sex: Treating RN: November 13, 1918 (89 y.o. F) Primary Care Provider: Richarda Blade Other Clinician: Referring Provider: Treating Provider/Extender: Velna Ochs, Dinah Weeks in Treatment: 9 Information Obtained from: Patient Chief Complaint 09/08/2022; left lower extremity wound 01/09/2023; left lower extremity areas of skin breakdown Electronic Signature(s) Signed: 03/16/2023 10:32:07 AM By: Geralyn Corwin DO Entered By: Geralyn Corwin on 03/13/2023 11:33:35 -------------------------------------------------------------------------------- HPI Details Patient Name: Date of Service: Heather [name], DO Heather J. 03/13/2023 11:00 A M Medical Record Number: [id number] Patient Account Number: [id number] Date of Birth/Sex: Treating RN: 17-Sep-1918 (89 y.o. F) Primary Care Provider: Richarda Blade Other Clinician: Referring Provider: Treating Provider/Extender: Velna Ochs, Dinah Weeks in Treatment: 9 History of Present Illness HPI Description: Admission 09/08/2022 [name] is a 89 year old female with a past medical history of venous insufficiency, chronic diastolic heart failure, dementia, and breast cancer that presents to the clinic for a 74-month history of nonhealing ulcer to the left lower extremity. She states over the past 2 months the site has become more painful And is weeping more. She visited her primary care office on 10/17 and was prescribed doxycycline for cellulitis of the left lower extremity. It is unclear if this has helped her symptoms. She has home health and they have been using Medihoney under compression therapy. They come out twice weekly.  Her ABIs in office were 1.08. Currently she denies systemic signs of infection. 10/31; patient presents for follow-up. Patient grew high levels of Pseudomonas aeruginosa, Staphylococcus aureus and Enterococcus bacillus from pcr culture at last clinic visit. Keystone antibiotic ointment has been ordered for the patient, she has not yet received this. She has tolerated the wrap well. 11/14; patient presents for follow-up. She started using Keystone antibiotic spray over the past 1 to 2 weeks. She has reported significant improvement in wound healing. She denies any pain. She has a small wound remaining to the dorsal aspect of her foot. 11/21; patient presents for follow-up. At last clinic visit we have been using [facility] antibiotic spray with silver alginate under Kerlix/Coban. She states she ordered her compression stockings and they are in the mail. Her wounds have healed. 01/09/2023 Heather [name] is a 89 year old female with a past medical history of venous insufficiency, chronic diastolic heart failure, dementia and breast cancer that presents the clinic for a 2-week history of skin breakdown to the left lower extremity. She was seen in our clinic several months ago for this issue. She states that home health is coming out twice a week and wrapping her legs. She currently denies signs of infection. She does not want to wear compression stockings. 3/8; patient presents for follow-up. She has been using Keystone antibiotic spray with silver alginate under Kerlix/Coban. She states that home health has been coming out. She has no issues or complaints today. 3/15; patient presents for follow-up. We have been using Keystone antibiotic spray with silver alginate and antifungal spray under Kerlix/Coban. She has no issues or complaints today. Area appears improved since last clinic visit with less maceration. 3/22; patient presents for follow-up. We have been using silver alginate with antifungal  spray and antibiotic spray under Kerlix/Coban. Patient has no issues or complaints today. Home health changes the dressings. 3/29; patient presents for follow-up. We have been using silver  alginate with antifungal spray and antibiotic spray under Unna boot to the left lower extremity. Home health has been changing the dressings. Patient has no issues or complaints today. 4/12; patient presents for follow-up. We have been using silver alginate with Keystone antibiotic ointment under [employer]. She has some nonviable tissue accumulation to the toes. 4/26; patient presents for follow-up. We have been using an Radio broadcast assistant with zinc oxide overall leg appears stable. She has no actual open wounds is just weeping. Heather [name], Heather [name] ([id number]) [id number] Page 2 of 7 Electronic Signature(s) Signed: 03/16/2023 10:32:07 AM By: Geralyn Corwin DO Entered By: Geralyn Corwin on 03/13/2023 11:35:39 -------------------------------------------------------------------------------- Physical Exam Details Patient Name: Date of Service: Heather [name], DO Heather J. 03/13/2023 11:00 A M Medical Record Number: [id number] Patient Account Number: [id number] Date of Birth/Sex: Treating RN: 06-11-1918 (89 y.o. F) Primary Care Provider: Richarda Blade Other Clinician: Referring Provider: Treating Provider/Extender: Geralyn Corwin Ngetich, Dinah Weeks in Treatment: 9 Constitutional respirations regular, non-labored and within target range for patient.. Cardiovascular 2+ dorsalis pedis/posterior tibialis pulses. Psychiatric pleasant and cooperative. Notes Left lower extremity: Circumferential superficial breakdown/Irritation to the leg. No signs of infection. Electronic Signature(s) Signed: 03/16/2023 10:32:07 AM By: Geralyn Corwin DO Entered By: Geralyn Corwin on 03/13/2023 11:36:42 -------------------------------------------------------------------------------- Physician Orders  Details Patient Name: Date of Service: Heather [name], DO Heather J. 03/13/2023 11:00 A M Medical Record Number: [id number] Patient Account Number: [id number] Date of Birth/Sex: Treating RN: Mar 03, 1918 (89 y.o. Heather [name] Primary Care Provider: Richarda Blade Other Clinician: Referring Provider: Treating Provider/Extender: Geralyn Corwin Ngetich, Dinah Weeks in Treatment: 9 Verbal / Phone Orders: No Diagnosis Coding ICD-10 Coding Code Description [phone] Non-pressure chronic ulcer of other part of left lower leg limited to breakdown of skin I87.312 Chronic venous hypertension (idiopathic) with ulcer of left lower extremity I50.32 Chronic diastolic (congestive) heart failure I10 Essential (primary) hypertension Z85.3 Personal history of malignant neoplasm of breast Follow-up Appointments ppointment in 1 week. - Dr. Mikey Bussing room [phone] 03/20/2023 Return A Other: - continue antifingal spray to toes. pick up steroid cream at pharmacy. Bathing/ Shower/ Hygiene May shower with protection but do not get wound dressing(s) wet. Protect dressing(s) with water repellant cover (for example, large plastic bag) or a cast cover and may then take shower. Edema Control - Lymphedema / SCD / Other Elevate legs to the level of the heart or above for 30 minutes daily and/or when sitting for 3-4 times a day throughout the day. Avoid standing for long periods of time. Home Health New wound care orders this week; continue Home Health for wound care. May utilize formulary equivalent dressing for wound treatment orders unless otherwise specified. - Antifungal spray to toes. gauze between toes. from foot to lower mid leg with steroid cream. apply tubigrip size D one layer. home health twice a week and all other days family to do. Other Home Health Orders/Instructions: - [facility] Heather [name], Heather [name] ([id number]) [id number] Page 3 of 7 Wound Treatment Wound #2 - Lower Leg  Wound Laterality: Left, Circumferential Cleanser: Soap and Water (Home Health) 1 x Per Day/15 Days Discharge Instructions: May shower and wash wound with dial antibacterial soap and water prior to dressing change. Topical: Triamcinolone 1 x Per Day/15 Days Discharge Instructions: Apply to foot and mid lower leg daily. Topical: antifungal spray (Home Health) 1 x Per Day/15 Days Discharge Instructions: apply over the counter antifungal spray at home to the toes. Ketoconazole in cilnic. Compression Wrap: tubigrip size D 1 x Per Day/15  Days Discharge Instructions: apply to left leg daily with dressing changes. One layer. Patient Medications llergies: fentanyl, naproxen A Notifications Medication Indication Start End 03/13/2023 triamcinolone acetonide DOSE 1 - topical 0.1 % cream - Apply daily to the areas of irritation Electronic Signature(s) Signed: 03/16/2023 10:32:07 AM By: Geralyn Corwin DO Previous Signature: 03/13/2023 11:38:29 AM Version By: Geralyn Corwin DO Entered By: Geralyn Corwin on 03/13/2023 11:38:39 -------------------------------------------------------------------------------- Problem List Details Patient Name: Date of Service: Heather [name], DO Heather J. 03/13/2023 11:00 A M Medical Record Number: [id number] Patient Account Number: [id number] Date of Birth/Sex: Treating RN: 25-Feb-1918 (89 y.o. Heather [name] Primary Care Provider: Richarda Blade Other Clinician: Referring Provider: Treating Provider/Extender: Velna Ochs, Dinah Weeks in Treatment: 9 Active Problems ICD-10 Encounter Code Description Active Date MDM Diagnosis L97.821 Non-pressure chronic ulcer of other part of left lower leg limited to breakdown 01/09/2023 No Yes of skin I87.312 Chronic venous hypertension (idiopathic) with ulcer of left lower extremity 01/09/2023 No Yes I50.32 Chronic diastolic (congestive) heart failure 01/09/2023 No Yes I10 Essential (primary) hypertension 01/09/2023 No  Yes Z85.3 Personal history of malignant neoplasm of breast 01/09/2023 No Yes Inactive Problems Resolved Problems Electronic Signature(s) Heather [name], Heather [name] ([id number]) [id number] Page 4 of 7 Signed: 03/16/2023 10:32:07 AM By: Geralyn Corwin DO Entered By: Geralyn Corwin on 03/13/2023 11:33:19 -------------------------------------------------------------------------------- Progress Note Details Patient Name: Date of Service: Heather [name], DO Heather J. 03/13/2023 11:00 A M Medical Record Number: [id number] Patient Account Number: [id number] Date of Birth/Sex: Treating RN: 01-Nov-1918 (89 y.o. F) Primary Care Provider: Richarda Blade Other Clinician: Referring Provider: Treating Provider/Extender: Velna Ochs, Dinah Weeks in Treatment: 9 Subjective Chief Complaint Information obtained from Patient 09/08/2022; left lower extremity wound 01/09/2023; left lower extremity areas of skin breakdown History of Present Illness (HPI) Admission 09/08/2022 Mr. Aryam Zhan is a 89 year old female with a past medical history of venous insufficiency, chronic diastolic heart failure, dementia, and breast cancer that presents to the clinic for a 80-month history of nonhealing ulcer to the left lower extremity. She states over the past 2 months the site has become more painful And is weeping more. She visited her primary care office on 10/17 and was prescribed doxycycline for cellulitis of the left lower extremity. It is unclear if this has helped her symptoms. She has home health and they have been using Medihoney under compression therapy. They come out twice weekly. Her ABIs in office were 1.08. Currently she denies systemic signs of infection. 10/31; patient presents for follow-up. Patient grew high levels of Pseudomonas aeruginosa, Staphylococcus aureus and Enterococcus bacillus from pcr culture at last clinic visit. Keystone antibiotic ointment has been ordered  for the patient, she has not yet received this. She has tolerated the wrap well. 11/14; patient presents for follow-up. She started using Keystone antibiotic spray over the past 1 to 2 weeks. She has reported significant improvement in wound healing. She denies any pain. She has a small wound remaining to the dorsal aspect of her foot. 11/21; patient presents for follow-up. At last clinic visit we have been using [facility] antibiotic spray with silver alginate under Kerlix/Coban. She states she ordered her compression stockings and they are in the mail. Her wounds have healed. 01/09/2023 [name] is a 89 year old female with a past medical history of venous insufficiency, chronic diastolic heart failure, dementia and breast cancer that presents the clinic for a 2-week history of skin breakdown to the left lower extremity. She was seen in our clinic several months ago for  this issue. She states that home health is coming out twice a week and wrapping her legs. She currently denies signs of infection. She does not want to wear compression stockings. 3/8; patient presents for follow-up. She has been using Keystone antibiotic spray with silver alginate under Kerlix/Coban. She states that home health has been coming out. She has no issues or complaints today. 3/15; patient presents for follow-up. We have been using Keystone antibiotic spray with silver alginate and antifungal spray under Kerlix/Coban. She has no issues or complaints today. Area appears improved since last clinic visit with less maceration. 3/22; patient presents for follow-up. We have been using silver alginate with antifungal spray and antibiotic spray under Kerlix/Coban. Patient has no issues or complaints today. Home health changes the dressings. 3/29; patient presents for follow-up. We have been using silver alginate with antifungal spray and antibiotic spray under Unna boot to the left lower extremity. Home health has been  changing the dressings. Patient has no issues or complaints today. 4/12; patient presents for follow-up. We have been using silver alginate with Keystone antibiotic ointment under [employer]. She has some nonviable tissue accumulation to the toes. 4/26; patient presents for follow-up. We have been using an Radio broadcast assistant with zinc oxide overall leg appears stable. She has no actual open wounds is just weeping. Patient History Social History Never smoker, Marital Status - Widowed, Alcohol Use - Never, Drug Use - No History, Caffeine Use - Never. Medical History Cardiovascular Patient has history of Hypertension, Peripheral Venous Disease Hospitalization/Surgery History - left mastectomy. Medical A Surgical History Notes nd Gastrointestinal laryngopharyngeal reflux disease diverticulosis Musculoskeletal osteoporosis carpel tunnel cervical spondylosis DDD Heather [name], Heather [name] ([id number]) [id number] Page 5 of 7 Objective Constitutional respirations regular, non-labored and within target range for patient.. Vitals Time Taken: 11:15 AM, Temperature: 97.9 F, Pulse: 71 bpm, Respiratory Rate: 17 breaths/min, Blood Pressure: 118/61 mmHg. Cardiovascular 2+ dorsalis pedis/posterior tibialis pulses. Psychiatric pleasant and cooperative. General Notes: Left lower extremity: Circumferential superficial breakdown/Irritation to the leg. No signs of infection. Integumentary (Hair, Skin) Wound #2 status is Open. Original cause of wound was Gradually Appeared. The date acquired was: 01/09/2023. The wound has been in treatment 9 weeks. The wound is located on the Left,Circumferential Lower Leg. The wound measures 21cm length x 23cm width x 0.1cm depth; 379.347cm^2 area and 37.935cm^3 volume. There is no tunneling or undermining noted. There is a medium amount of serosanguineous drainage noted. The wound margin is distinct with the outline attached to the wound base. There is large  (67-100%) red, pink granulation within the wound bed. The periwound skin appearance exhibited: Maceration, Hemosiderin Staining. The periwound skin appearance did not exhibit: Callus, Crepitus, Excoriation, Induration, Rash, Scarring, Dry/Scaly, Atrophie Blanche, Cyanosis, Ecchymosis, Mottled, Pallor, Rubor, Erythema. Periwound temperature was noted as No Abnormality. The periwound has tenderness on palpation. Assessment Active Problems ICD-10 Non-pressure chronic ulcer of other part of left lower leg limited to breakdown of skin Chronic venous hypertension (idiopathic) with ulcer of left lower extremity Chronic diastolic (congestive) heart failure Essential (primary) hypertension Personal history of malignant neoplasm of breast Patient's left lower extremity is stable with similar appearance and weeping. At this time I recommended steroid cream as I think this is more of a dermatological issue than a wound care issue. Can continue antifungal spray to the toes. Use Tubigrip daily. follow-up in 1 week. Plan Follow-up Appointments: Return Appointment in 1 week. - Dr. Mikey Bussing room [phone] 03/20/2023 Other: - continue antifingal spray to toes. pick up  steroid cream at pharmacy. Bathing/ Shower/ Hygiene: May shower with protection but do not get wound dressing(s) wet. Protect dressing(s) with water repellant cover (for example, large plastic bag) or a cast cover and may then take shower. Edema Control - Lymphedema / SCD / Other: Elevate legs to the level of the heart or above for 30 minutes daily and/or when sitting for 3-4 times a day throughout the day. Avoid standing for long periods of time. Home Health: New wound care orders this week; continue Home Health for wound care. May utilize formulary equivalent dressing for wound treatment orders unless otherwise specified. - Antifungal spray to toes. gauze between toes. from foot to lower mid leg with steroid cream. apply tubigrip size D one layer.  home health twice a week and all other days family to do. Other Home Health Orders/Instructions: - [facility] The following medication(s) was prescribed: triamcinolone acetonide topical 0.1 % cream 1 Apply daily to the areas of irritation starting 03/13/2023 WOUND #2: - Lower Leg Wound Laterality: Left, Circumferential Cleanser: Soap and Water (Home Health) 1 x Per Day/15 Days Discharge Instructions: May shower and wash wound with dial antibacterial soap and water prior to dressing change. Topical: Triamcinolone 1 x Per Day/15 Days Discharge Instructions: Apply to foot and mid lower leg daily. Topical: antifungal spray (Home Health) 1 x Per Day/15 Days Discharge Instructions: apply over the counter antifungal spray at home to the toes. Ketoconazole in cilnic. Com pression Wrap: tubigrip size D 1 x Per Day/15 Days Discharge Instructions: apply to left leg daily with dressing changes. One layer. 1. Triamcinolone cream 2. Tubigrip daily 3. Antifungal spray to the toes 4. Follow-up in 1 week Heather [name], Heather [name] ([id number]) [id number] Page 6 of 7 Electronic Signature(s) Signed: 03/16/2023 10:32:07 AM By: Geralyn Corwin DO Entered By: Geralyn Corwin on 03/13/2023 11:39:41 -------------------------------------------------------------------------------- HxROS Details Patient Name: Date of Service: Heather [name], DO Heather J. 03/13/2023 11:00 A M Medical Record Number: [id number] Patient Account Number: [id number] Date of Birth/Sex: Treating RN: 07-14-1918 (89 y.o. F) Primary Care Provider: Richarda Blade Other Clinician: Referring Provider: Treating Provider/Extender: Geralyn Corwin Ngetich, Dinah Weeks in Treatment: 9 Cardiovascular Medical History: Positive for: Hypertension; Peripheral Venous Disease Gastrointestinal Medical History: Past Medical History Notes: laryngopharyngeal reflux disease diverticulosis Musculoskeletal Medical History: Past  Medical History Notes: osteoporosis carpel tunnel cervical spondylosis DDD Immunizations Pneumococcal Vaccine: Received Pneumococcal Vaccination: Yes Received Pneumococcal Vaccination On or After 60th Birthday: Yes Implantable Devices No devices added Hospitalization / Surgery History Type of Hospitalization/Surgery left mastectomy Family and Social History Never smoker; Marital Status - Widowed; Alcohol Use: Never; Drug Use: No History; Caffeine Use: Never; Financial Concerns: No; Food, Clothing or Shelter Needs: No; Support System Lacking: No Electronic Signature(s) Signed: 03/16/2023 10:32:07 AM By: Geralyn Corwin DO Entered By: Geralyn Corwin on 03/13/2023 11:35:44 -------------------------------------------------------------------------------- SuperBill Details Patient Name: Date of Service: Heather [name], DO Heather J. 03/13/2023 Medical Record Number: [id number] Patient Account Number: [id number] Date of Birth/Sex: Treating RN: 1918-06-19 (89 y.o. Heather [name] Primary Care Provider: Richarda Blade Other Clinician: Referring Provider: Treating Provider/Extender: Geralyn Corwin Ngetich, Dinah Weeks in Treatment: 9 Diagnosis Coding ICD-10 Codes Code Description Heather [name], Heather [name] ([id number]) [id number] Page 7 of 7 [phone] Non-pressure chronic ulcer of other part of left lower leg limited to breakdown of skin I87.312 Chronic venous hypertension (idiopathic) with ulcer of left lower extremity I50.32 Chronic diastolic (congestive) heart failure I10 Essential (primary) hypertension Z85.3 Personal history of malignant neoplasm of breast Facility Procedures : CPT4 Code: [id number]  Description: 99214 - WOUND CARE VISIT-LEV 4 EST PT Modifier: Quantity: 1 Physician Procedures : CPT4 Code Description Modifier [id number] - WC PHYS LEVEL 3 - EST PT ICD-10 Diagnosis Description L97.821 Non-pressure chronic ulcer of other part of left lower leg  limited to breakdown of skin I87.312 Chronic venous hypertension (idiopathic) with  ulcer of left lower extremity I50.32 Chronic diastolic (congestive) heart failure I10 Essential (primary) hypertension Quantity: 1 Electronic Signature(s) Signed: 03/16/2023 10:32:07 AM By: Geralyn Corwin DO Entered By: Geralyn Corwin on 03/13/2023 11:39:53

## 2023-03-16 NOTE — Progress Notes (Signed)
GIANELLA, CHISMAR ([id number]) [id number] Page 1 of 8 Visit Report for 03/13/2023 Arrival Information Details Patient Name: Date of Service: HA Heather [name] 03/13/2023 11:00 A M Medical Record Number: [id number] Patient Account Number: [id number] Date of Birth/Sex: Treating RN: February 03, 1918 (89 y.o. F) Primary Care Areon Cocuzza: Richarda Blade Other Clinician: Referring Annalaya Wile: Treating Yoneko Talerico/Extender: Geralyn Corwin Ngetich, Dinah Weeks in Treatment: 9 Visit Information History Since Last Visit Added or deleted any medications: No Patient Arrived: Wheel Chair Any new allergies or adverse reactions: No Arrival Time: 11:14 Had a fall or experienced change in No Accompanied By: son activities of daily living that may affect Transfer Assistance: Manual risk of falls: Patient Identification Verified: Yes Signs or symptoms of abuse/neglect since last visito No Secondary Verification Process Completed: Yes Hospitalized since last visit: No Patient Requires Transmission-Based Precautions: No Implantable device outside of the clinic excluding No Patient Has Alerts: Yes cellular tissue based products placed in the center Patient Alerts: ABI's:L:1.08 10/23 since last visit: Has Dressing in Place as Prescribed: Yes Pain Present Now: Yes Electronic Signature(s) Signed: 03/16/2023 10:05:50 AM By: Karl Ito Entered By: Karl Ito on 03/13/2023 11:15:20 -------------------------------------------------------------------------------- Clinic Level of Care Assessment Details Patient Name: Date of Service: HA Heather [name] 03/13/2023 11:00 A M Medical Record Number: [id number] Patient Account Number: [id number] Date of Birth/Sex: Treating RN: 1918/01/02 (89 y.o. Arta Silence Primary Care Panhia Karl: Richarda Blade Other Clinician: Referring Terasa Orsini: Treating Monserat Prestigiacomo/Extender: Geralyn Corwin Ngetich, Dinah Weeks in Treatment: 9 Clinic  Level of Care Assessment Items TOOL 4 Quantity Score X- 1 0 Use when only an EandM is performed on FOLLOW-UP visit ASSESSMENTS - Nursing Assessment / Reassessment X- 1 10 Reassessment of Co-morbidities (includes updates in patient status) X- 1 5 Reassessment of Adherence to Treatment Plan ASSESSMENTS - Wound and Skin A ssessment / Reassessment X - Simple Wound Assessment / Reassessment - one wound 1 5 []  - 0 Complex Wound Assessment / Reassessment - multiple wounds X- 1 10 Dermatologic / Skin Assessment (not related to wound area) ASSESSMENTS - Focused Assessment X- 1 5 Circumferential Edema Measurements - multi extremities []  - 0 Nutritional Assessment / Counseling / Intervention []  - 0 Lower Extremity Assessment (monofilament, tuning fork, pulses) []  - 0 Peripheral Arterial Disease Assessment (using hand held doppler) ASSESSMENTS - Ostomy and/or Continence Assessment and Care []  - 0 Incontinence Assessment and Management []  - 0 Ostomy Care Assessment and Management (repouching, etc.) PROCESS - Coordination of Care X - Simple Patient / Family Education for ongoing care 1 [id number] ([id number]) [id number] Page 2 of 8 []  - 0 Complex (extensive) Patient / Family Education for ongoing care X- 1 10 Staff obtains Chiropractor, Records, T Results / Process Orders est X- 1 10 Staff telephones HHA, Nursing Homes / Clarify orders / etc []  - 0 Routine Transfer to another Facility (non-emergent condition) []  - 0 Routine Hospital Admission (non-emergent condition) []  - 0 New Admissions / Manufacturing engineer / Ordering NPWT Apligraf, etc. , []  - 0 Emergency Hospital Admission (emergent condition) X- 1 10 Simple Discharge Coordination []  - 0 Complex (extensive) Discharge Coordination PROCESS - Special Needs []  - 0 Pediatric / Minor Patient Management []  - 0 Isolation Patient Management []  - 0 Hearing / Language / Visual special  needs []  - 0 Assessment of Community assistance (transportation, D/C planning, etc.) []  - 0 Additional assistance / Altered mentation []  - 0 Support Surface(s) Assessment (bed, cushion, seat, etc.) INTERVENTIONS - Wound Cleansing / Measurement  X - Simple Wound Cleansing - one wound 1 5 []  - 0 Complex Wound Cleansing - multiple wounds X- 1 5 Wound Imaging (photographs - any number of wounds) []  - 0 Wound Tracing (instead of photographs) X- 1 5 Simple Wound Measurement - one wound []  - 0 Complex Wound Measurement - multiple wounds INTERVENTIONS - Wound Dressings []  - 0 Small Wound Dressing one or multiple wounds []  - 0 Medium Wound Dressing one or multiple wounds X- 1 20 Large Wound Dressing one or multiple wounds []  - 0 Application of Medications - topical []  - 0 Application of Medications - injection INTERVENTIONS - Miscellaneous []  - 0 External ear exam []  - 0 Specimen Collection (cultures, biopsies, blood, body fluids, etc.) []  - 0 Specimen(s) / Culture(s) sent or taken to Lab for analysis []  - 0 Patient Transfer (multiple staff / Nurse, adult / Similar devices) []  - 0 Simple Staple / Suture removal (25 or less) []  - 0 Complex Staple / Suture removal (26 or more) []  - 0 Hypo / Hyperglycemic Management (close monitor of Blood Glucose) []  - 0 Ankle / Brachial Index (ABI) - do not check if billed separately X- 1 5 Vital Signs Has the patient been seen at the hospital within the last three years: Yes Total Score: 120 Level Of Care: New/Established - Level 4 Electronic Signature(s) Signed: 03/13/2023 1:01:48 PM By: Shawn Stall RN, BSN Entered By: Shawn Stall on 03/13/2023 11:32:02 [name] ([id number]) [id number] Page 3 of 8 -------------------------------------------------------------------------------- Encounter Discharge Information Details Patient Name: Date of Service: HA Heather [name] 03/13/2023 11:00 A M Medical  Record Number: [id number] Patient Account Number: [id number] Date of Birth/Sex: Treating RN: Mar 24, 1918 (89 y.o. Arta Silence Primary Care Veronique Warga: Richarda Blade Other Clinician: Referring Johnnetta Holstine: Treating Lurlene Ronda/Extender: Geralyn Corwin Ngetich, Dinah Weeks in Treatment: 9 Encounter Discharge Information Items Discharge Condition: Stable Ambulatory Status: Wheelchair Discharge Destination: Home Transportation: Private Auto Accompanied By: son Schedule Follow-up Appointment: Yes Clinical Summary of Care: Electronic Signature(s) Signed: 03/13/2023 1:01:48 PM By: Shawn Stall RN, BSN Entered By: Shawn Stall on 03/13/2023 11:32:27 -------------------------------------------------------------------------------- Lower Extremity Assessment Details Patient Name: Date of Service: [name], DO RETHEA J. 03/13/2023 11:00 A M Medical Record Number: [id number] Patient Account Number: [id number] Date of Birth/Sex: Treating RN: 07-30-1918 (89 y.o. Arta Silence Primary Care Zeth Buday: Richarda Blade Other Clinician: Referring Canyon Willow: Treating Emeri Estill/Extender: Geralyn Corwin Ngetich, Dinah Weeks in Treatment: 9 Edema Assessment Assessed: [Left: Yes] [Right: No] Edema: [Left: Ye] [Right: s] Calf Left: Right: Point of Measurement: 32 cm From Medial Instep 29 cm Ankle Left: Right: Point of Measurement: 10 cm From Medial Instep 26 cm Vascular Assessment Pulses: Dorsalis Pedis Palpable: [Left:Yes] Electronic Signature(s) Signed: 03/13/2023 1:01:48 PM By: Shawn Stall RN, BSN Entered By: Shawn Stall on 03/13/2023 11:21:36 -------------------------------------------------------------------------------- Multi Wound Chart Details Patient Name: Date of Service: [name], DO RETHEA J. 03/13/2023 11:00 A M Medical Record Number: [id number] Patient Account Number: [id number] Date of Birth/Sex: Treating RN: 05-12-1918 (89 y.o. F) Primary Care Yaqueline Gutter: Richarda Blade Other  Clinician: Referring Rohnan Bartleson: Treating Chakira Jachim/Extender: Velna Ochs, Dinah Weeks in Treatment: [address] Kristyn J ([id number]) [id number] Page 4 of 8 Vital Signs Height(in): Pulse(bpm): 71 Weight(lbs): Blood Pressure(mmHg): 118/61 Body Mass Index(BMI): Temperature(F): 97.9 Respiratory Rate(breaths/min): 17 [2:Photos:] [N/A:N/A] Left, Circumferential Lower Leg N/A N/A Wound Location: Gradually Appeared N/A N/A Wounding Event: Venous Leg Ulcer N/A N/A Primary Etiology: Hypertension, Peripheral Venous N/A N/A Comorbid History: Disease 01/09/2023 N/A N/A Date Acquired: 9 N/A  N/A Weeks of Treatment: Open N/A N/A Wound Status: No N/A N/A Wound Recurrence: 21x23x0.1 N/A N/A Measurements L x W x D (cm) 379.347 N/A N/A A (cm) : rea 37.935 N/A N/A Volume (cm) : -18.40% N/A N/A % Reduction in Area: -18.40% N/A N/A % Reduction in Volume: Full Thickness Without Exposed N/A N/A Classification: Support Structures Medium N/A N/A Exudate Amount: Serosanguineous N/A N/A Exudate Type: red, brown N/A N/A Exudate Color: Distinct, outline attached N/A N/A Wound Margin: Large (67-100%) N/A N/A Granulation Amount: Red, Pink N/A N/A Granulation Quality: Fascia: No N/A N/A Exposed Structures: Fat Layer (Subcutaneous Tissue): No Tendon: No Muscle: No Joint: No Bone: No Small (1-33%) N/A N/A Epithelialization: Excoriation: No N/A N/A Periwound Skin Texture: Induration: No Callus: No Crepitus: No Rash: No Scarring: No Maceration: Yes N/A N/A Periwound Skin Moisture: Dry/Scaly: No Hemosiderin Staining: Yes N/A N/A Periwound Skin Color: Atrophie Blanche: No Cyanosis: No Ecchymosis: No Erythema: No Mottled: No Pallor: No Rubor: No No Abnormality N/A N/A Temperature: Yes N/A N/A Tenderness on Palpation: Treatment Notes Wound #2 (Lower Leg) Wound Laterality: Left, Circumferential Cleanser Soap and  Water Discharge Instruction: May shower and wash wound with dial antibacterial soap and water prior to dressing change. Peri-Wound Care Topical Triamcinolone Discharge Instruction: Apply to foot and mid lower leg daily. antifungal spray BRITISH, MOYD ([id number]) [id number] Page 5 of 8 Discharge Instruction: apply over the counter antifungal spray at home to the toes. Ketoconazole in cilnic. Primary Dressing Secondary Dressing Secured With Compression Wrap tubigrip size D Discharge Instruction: apply to left leg daily with dressing changes. One layer. Compression Stockings Add-Ons Electronic Signature(s) Signed: 03/16/2023 10:32:07 AM By: Geralyn Corwin DO Entered By: Geralyn Corwin on 03/13/2023 11:33:26 -------------------------------------------------------------------------------- Multi-Disciplinary Care Plan Details Patient Name: Date of Service: Miquel Dunn, DO RETHEA J. 03/13/2023 11:00 A M Medical Record Number: [id number] Patient Account Number: [id number] Date of Birth/Sex: Treating RN: Mar 24, 1918 (89 y.o. Arta Silence Primary Care Ash Mcelwain: Richarda Blade Other Clinician: Referring Jaequan Propes: Treating Ambyr Qadri/Extender: Geralyn Corwin Ngetich, Dinah Weeks in Treatment: 9 Active Inactive Wound/Skin Impairment Nursing Diagnoses: Impaired tissue integrity Knowledge deficit related to ulceration/compromised skin integrity Goals: Patient will have a decrease in wound volume by X% from date: (specify in notes) Date Initiated: 01/09/2023 Target Resolution Date: 04/17/2023 Goal Status: Active Patient/caregiver will verbalize understanding of skin care regimen Date Initiated: 01/09/2023 Target Resolution Date: 04/17/2023 Goal Status: Active Ulcer/skin breakdown will have a volume reduction of 30% by week 4 Date Initiated: 01/09/2023 Date Inactivated: 03/13/2023 Target Resolution Date: 03/21/2023 Unmet Reason: see wound Goal Status:  Unmet measurements. Interventions: Assess patient/caregiver ability to obtain necessary supplies Assess patient/caregiver ability to perform ulcer/skin care regimen upon admission and as needed Assess ulceration(s) every visit Notes: Electronic Signature(s) Signed: 03/13/2023 1:01:48 PM By: Shawn Stall RN, BSN Entered By: Shawn Stall on 03/13/2023 11:18:17 -------------------------------------------------------------------------------- Pain Assessment Details Patient Name: Date of Service: [name], DO RETHEA J. 03/13/2023 11:00 A M Medical Record Number: [id number] Patient Account Number: [id number] Date of Birth/Sex: Treating RN: [birth year] (89 y.o. F) Primary Care Jaquita Bessire: Richarda Blade Other Clinician: SYLVESTER, SALONGA ([id number]) [id number] Page 6 of 8 Referring Mekia Dipinto: Treating Carynn Felling/Extender: Geralyn Corwin Ngetich, Dinah Weeks in Treatment: 9 Active Problems Location of Pain Severity and Description of Pain Patient Has Paino Yes Site Locations Rate the pain. Current Pain Level: 4 Pain Management and Medication Current Pain Management: Electronic Signature(s) Signed: 03/16/2023 10:05:50 AM By: Karl Ito Entered By: Karl Ito on 03/13/2023 11:15:46 -------------------------------------------------------------------------------- Patient/Caregiver Education Details  Patient Name: Date of Service: HA Heather [name] 4/26/2024andnbsp11:00 A M Medical Record Number: [id number] Patient Account Number: [id number] Date of Birth/Gender: Treating RN: [birth year] (89 y.o. Arta Silence Primary Care Physician: Richarda Blade Other Clinician: Referring Physician: Treating Physician/Extender: Jae Dire in Treatment: 9 Education Assessment Education Provided To: Patient and Caregiver son Education Topics Provided Wound/Skin Impairment: Handouts: Caring for Your Ulcer Methods:  Explain/Verbal Responses: State content correctly Electronic Signature(s) Signed: 03/13/2023 1:01:48 PM By: Shawn Stall RN, BSN Entered By: Shawn Stall on 03/13/2023 11:18:48 -------------------------------------------------------------------------------- Wound Assessment Details Patient Name: Date of Service: [name], DO RETHEA J. 03/13/2023 11:00 A M Medical Record Number: [id number] Patient Account Number: [id number] Date of Birth/Sex: Treating RN: 07/14/1918 (89 y.o. Arta Silence Primary Care Anitria Andon: Richarda Blade Other Clinician: Referring Khalia Gong: Treating Eileen Kangas/Extender: Edom, Schmuhl, Rolena Infante ([id number]) [id number] Page 7 of 8 Weeks in Treatment: 9 Wound Status Wound Number: 2 Primary Etiology: Venous Leg Ulcer Wound Location: Left, Circumferential Lower Leg Wound Status: Open Wounding Event: Gradually Appeared Comorbid History: Hypertension, Peripheral Venous Disease Date Acquired: 01/09/2023 Weeks Of Treatment: 9 Clustered Wound: No Photos Wound Measurements Length: (cm) 21 Width: (cm) 23 Depth: (cm) 0.1 Area: (cm) 379.347 Volume: (cm) 37.935 % Reduction in Area: -18.4% % Reduction in Volume: -18.4% Epithelialization: Small (1-33%) Tunneling: No Undermining: No Wound Description Classification: Full Thickness Without Exposed Support Structures Wound Margin: Distinct, outline attached Exudate Amount: Medium Exudate Type: Serosanguineous Exudate Color: red, brown Foul Odor After Cleansing: No Slough/Fibrino No Wound Bed Granulation Amount: Large (67-100%) Exposed Structure Granulation Quality: Red, Pink Fascia Exposed: No Fat Layer (Subcutaneous Tissue) Exposed: No Tendon Exposed: No Muscle Exposed: No Joint Exposed: No Bone Exposed: No Periwound Skin Texture Texture Color No Abnormalities Noted: No No Abnormalities Noted: No Callus: No Atrophie Blanche: No Crepitus:  No Cyanosis: No Excoriation: No Ecchymosis: No Induration: No Erythema: No Rash: No Hemosiderin Staining: Yes Scarring: No Mottled: No Pallor: No Moisture Rubor: No No Abnormalities Noted: No Dry / Scaly: No Temperature / Pain Maceration: Yes Temperature: No Abnormality Tenderness on Palpation: Yes Treatment Notes Wound #2 (Lower Leg) Wound Laterality: Left, Circumferential Cleanser Soap and Water Discharge Instruction: May shower and wash wound with dial antibacterial soap and water prior to dressing change. Peri-Wound Care Topical JANNETTA, MASSEY ([id number]) [id number] Page 8 of 8 Triamcinolone Discharge Instruction: Apply to foot and mid lower leg daily. antifungal spray Discharge Instruction: apply over the counter antifungal spray at home to the toes. Ketoconazole in cilnic. Primary Dressing Secondary Dressing Secured With Compression Wrap tubigrip size D Discharge Instruction: apply to left leg daily with dressing changes. One layer. Compression Stockings Add-Ons Electronic Signature(s) Signed: 03/13/2023 1:01:48 PM By: Shawn Stall RN, BSN Signed: 03/16/2023 10:05:50 AM By: Karl Ito Entered By: Karl Ito on 03/13/2023 11:23:52 -------------------------------------------------------------------------------- Vitals Details Patient Name: Date of Service: [name], DO RETHEA J. 03/13/2023 11:00 A M Medical Record Number: [id number] Patient Account Number: [id number] Date of Birth/Sex: Treating RN: May 16, 1918 (89 y.o. F) Primary Care America Sandall: Richarda Blade Other Clinician: Referring Ayomide Zuleta: Treating Aydin Hink/Extender: Geralyn Corwin Ngetich, Dinah Weeks in Treatment: 9 Vital Signs Time Taken: 11:15 Temperature (F): 97.9 Pulse (bpm): 71 Respiratory Rate (breaths/min): 17 Blood Pressure (mmHg): 118/61 Reference Range: 80 - 120 mg / dl Electronic Signature(s) Signed: 03/16/2023 10:05:50 AM By: Karl Ito Entered By: Karl Ito on 03/13/2023 11:15:39

## 2023-03-17 DIAGNOSIS — K5730 Diverticulosis of large intestine without perforation or abscess without bleeding: Secondary | ICD-10-CM | POA: Diagnosis not present

## 2023-03-17 DIAGNOSIS — M810 Age-related osteoporosis without current pathological fracture: Secondary | ICD-10-CM | POA: Diagnosis not present

## 2023-03-17 DIAGNOSIS — I110 Hypertensive heart disease with heart failure: Secondary | ICD-10-CM | POA: Diagnosis not present

## 2023-03-17 DIAGNOSIS — F0284 Dementia in other diseases classified elsewhere, unspecified severity, with anxiety: Secondary | ICD-10-CM | POA: Diagnosis not present

## 2023-03-17 DIAGNOSIS — L97821 Non-pressure chronic ulcer of other part of left lower leg limited to breakdown of skin: Secondary | ICD-10-CM | POA: Diagnosis not present

## 2023-03-17 DIAGNOSIS — L97521 Non-pressure chronic ulcer of other part of left foot limited to breakdown of skin: Secondary | ICD-10-CM | POA: Diagnosis not present

## 2023-03-17 DIAGNOSIS — K219 Gastro-esophageal reflux disease without esophagitis: Secondary | ICD-10-CM | POA: Diagnosis not present

## 2023-03-17 DIAGNOSIS — K5900 Constipation, unspecified: Secondary | ICD-10-CM | POA: Diagnosis not present

## 2023-03-17 DIAGNOSIS — I5032 Chronic diastolic (congestive) heart failure: Secondary | ICD-10-CM | POA: Diagnosis not present

## 2023-03-17 DIAGNOSIS — I87312 Chronic venous hypertension (idiopathic) with ulcer of left lower extremity: Secondary | ICD-10-CM | POA: Diagnosis not present

## 2023-03-17 DIAGNOSIS — G894 Chronic pain syndrome: Secondary | ICD-10-CM | POA: Diagnosis not present

## 2023-03-17 DIAGNOSIS — G301 Alzheimer's disease with late onset: Secondary | ICD-10-CM | POA: Diagnosis not present

## 2023-03-17 DIAGNOSIS — J309 Allergic rhinitis, unspecified: Secondary | ICD-10-CM | POA: Diagnosis not present

## 2023-03-17 DIAGNOSIS — F411 Generalized anxiety disorder: Secondary | ICD-10-CM | POA: Diagnosis not present

## 2023-03-17 DIAGNOSIS — M47812 Spondylosis without myelopathy or radiculopathy, cervical region: Secondary | ICD-10-CM | POA: Diagnosis not present

## 2023-03-17 DIAGNOSIS — I4510 Unspecified right bundle-branch block: Secondary | ICD-10-CM | POA: Diagnosis not present

## 2023-03-20 ENCOUNTER — Encounter (HOSPITAL_BASED_OUTPATIENT_CLINIC_OR_DEPARTMENT_OTHER): Payer: Medicare Other | Attending: Internal Medicine | Admitting: Internal Medicine

## 2023-03-20 DIAGNOSIS — L97929 Non-pressure chronic ulcer of unspecified part of left lower leg with unspecified severity: Secondary | ICD-10-CM | POA: Insufficient documentation

## 2023-03-20 DIAGNOSIS — L97821 Non-pressure chronic ulcer of other part of left lower leg limited to breakdown of skin: Secondary | ICD-10-CM

## 2023-03-20 DIAGNOSIS — I87312 Chronic venous hypertension (idiopathic) with ulcer of left lower extremity: Secondary | ICD-10-CM

## 2023-03-20 DIAGNOSIS — I5032 Chronic diastolic (congestive) heart failure: Secondary | ICD-10-CM | POA: Diagnosis not present

## 2023-03-20 DIAGNOSIS — I10 Essential (primary) hypertension: Secondary | ICD-10-CM

## 2023-03-20 DIAGNOSIS — I739 Peripheral vascular disease, unspecified: Secondary | ICD-10-CM | POA: Insufficient documentation

## 2023-03-21 DIAGNOSIS — K5730 Diverticulosis of large intestine without perforation or abscess without bleeding: Secondary | ICD-10-CM | POA: Diagnosis not present

## 2023-03-21 DIAGNOSIS — G301 Alzheimer's disease with late onset: Secondary | ICD-10-CM | POA: Diagnosis not present

## 2023-03-21 DIAGNOSIS — I4510 Unspecified right bundle-branch block: Secondary | ICD-10-CM | POA: Diagnosis not present

## 2023-03-21 DIAGNOSIS — F0284 Dementia in other diseases classified elsewhere, unspecified severity, with anxiety: Secondary | ICD-10-CM | POA: Diagnosis not present

## 2023-03-21 DIAGNOSIS — L97821 Non-pressure chronic ulcer of other part of left lower leg limited to breakdown of skin: Secondary | ICD-10-CM | POA: Diagnosis not present

## 2023-03-21 DIAGNOSIS — J309 Allergic rhinitis, unspecified: Secondary | ICD-10-CM | POA: Diagnosis not present

## 2023-03-21 DIAGNOSIS — K5900 Constipation, unspecified: Secondary | ICD-10-CM | POA: Diagnosis not present

## 2023-03-21 DIAGNOSIS — F411 Generalized anxiety disorder: Secondary | ICD-10-CM | POA: Diagnosis not present

## 2023-03-21 DIAGNOSIS — I87312 Chronic venous hypertension (idiopathic) with ulcer of left lower extremity: Secondary | ICD-10-CM | POA: Diagnosis not present

## 2023-03-21 DIAGNOSIS — I110 Hypertensive heart disease with heart failure: Secondary | ICD-10-CM | POA: Diagnosis not present

## 2023-03-21 DIAGNOSIS — K219 Gastro-esophageal reflux disease without esophagitis: Secondary | ICD-10-CM | POA: Diagnosis not present

## 2023-03-21 DIAGNOSIS — G894 Chronic pain syndrome: Secondary | ICD-10-CM | POA: Diagnosis not present

## 2023-03-21 DIAGNOSIS — L97521 Non-pressure chronic ulcer of other part of left foot limited to breakdown of skin: Secondary | ICD-10-CM | POA: Diagnosis not present

## 2023-03-21 DIAGNOSIS — I5032 Chronic diastolic (congestive) heart failure: Secondary | ICD-10-CM | POA: Diagnosis not present

## 2023-03-21 DIAGNOSIS — M810 Age-related osteoporosis without current pathological fracture: Secondary | ICD-10-CM | POA: Diagnosis not present

## 2023-03-21 DIAGNOSIS — M47812 Spondylosis without myelopathy or radiculopathy, cervical region: Secondary | ICD-10-CM | POA: Diagnosis not present

## 2023-03-24 DIAGNOSIS — M47812 Spondylosis without myelopathy or radiculopathy, cervical region: Secondary | ICD-10-CM | POA: Diagnosis not present

## 2023-03-24 DIAGNOSIS — M810 Age-related osteoporosis without current pathological fracture: Secondary | ICD-10-CM | POA: Diagnosis not present

## 2023-03-24 DIAGNOSIS — L97821 Non-pressure chronic ulcer of other part of left lower leg limited to breakdown of skin: Secondary | ICD-10-CM | POA: Diagnosis not present

## 2023-03-24 DIAGNOSIS — I5032 Chronic diastolic (congestive) heart failure: Secondary | ICD-10-CM | POA: Diagnosis not present

## 2023-03-24 DIAGNOSIS — K5900 Constipation, unspecified: Secondary | ICD-10-CM | POA: Diagnosis not present

## 2023-03-24 DIAGNOSIS — I87312 Chronic venous hypertension (idiopathic) with ulcer of left lower extremity: Secondary | ICD-10-CM | POA: Diagnosis not present

## 2023-03-24 DIAGNOSIS — F0284 Dementia in other diseases classified elsewhere, unspecified severity, with anxiety: Secondary | ICD-10-CM | POA: Diagnosis not present

## 2023-03-24 DIAGNOSIS — G894 Chronic pain syndrome: Secondary | ICD-10-CM | POA: Diagnosis not present

## 2023-03-24 DIAGNOSIS — K5730 Diverticulosis of large intestine without perforation or abscess without bleeding: Secondary | ICD-10-CM | POA: Diagnosis not present

## 2023-03-24 DIAGNOSIS — K219 Gastro-esophageal reflux disease without esophagitis: Secondary | ICD-10-CM | POA: Diagnosis not present

## 2023-03-24 DIAGNOSIS — F411 Generalized anxiety disorder: Secondary | ICD-10-CM | POA: Diagnosis not present

## 2023-03-24 DIAGNOSIS — I4510 Unspecified right bundle-branch block: Secondary | ICD-10-CM | POA: Diagnosis not present

## 2023-03-24 DIAGNOSIS — I110 Hypertensive heart disease with heart failure: Secondary | ICD-10-CM | POA: Diagnosis not present

## 2023-03-24 DIAGNOSIS — J309 Allergic rhinitis, unspecified: Secondary | ICD-10-CM | POA: Diagnosis not present

## 2023-03-24 DIAGNOSIS — G301 Alzheimer's disease with late onset: Secondary | ICD-10-CM | POA: Diagnosis not present

## 2023-03-24 DIAGNOSIS — L97521 Non-pressure chronic ulcer of other part of left foot limited to breakdown of skin: Secondary | ICD-10-CM | POA: Diagnosis not present

## 2023-03-24 NOTE — Progress Notes (Signed)
RESHA, ISHII ([id number]) [id number] Page 1 of 8 Visit Report for 03/20/2023 Arrival Information Details Patient Name: Date of Service: HA Heather [name] 03/20/2023 10:15 A M Medical Record Number: [id number] Patient Account Number: [id number] Date of Birth/Sex: Treating RN: 28-Aug-1918 (89 y.o. Debara Pickett, Millard.Loa Primary Care Lissete Maestas: Richarda Blade Other Clinician: Referring Teah Votaw: Treating Andy Moye/Extender: Geralyn Corwin Ngetich, Dinah Weeks in Treatment: 10 Visit Information History Since Last Visit Added or deleted any medications: No Patient Arrived: Wheel Chair Any new allergies or adverse reactions: No Arrival Time: 10:24 Had a fall or experienced change in No Accompanied By: son activities of daily living that may affect Transfer Assistance: None risk of falls: Patient Identification Verified: Yes Signs or symptoms of abuse/neglect since last visito No Secondary Verification Process Completed: Yes Hospitalized since last visit: No Patient Requires Transmission-Based Precautions: No Implantable device outside of the clinic excluding No Patient Has Alerts: Yes cellular tissue based products placed in the center Patient Alerts: ABI's:L:1.08 10/23 since last visit: Has Dressing in Place as Prescribed: Yes Has Compression in Place as Prescribed: Yes Pain Present Now: No Electronic Signature(s) Signed: 03/23/2023 5:08:59 PM By: Shawn Stall RN, BSN Entered By: Shawn Stall on 03/20/2023 10:24:47 -------------------------------------------------------------------------------- Clinic Level of Care Assessment Details Patient Name: Date of Service: [name], DO RETHEA J. 03/20/2023 10:15 A M Medical Record Number: [id number] Patient Account Number: [id number] Date of Birth/Sex: Treating RN: 01/28/1918 (89 y.o. Arta Silence Primary Care Gorgeous [place]lun: Richarda Blade Other Clinician: Referring Brittiny Levitz: Treating Shandora Koogler/Extender: Geralyn Corwin Ngetich, Dinah Weeks in Treatment: 10 Clinic Level of Care Assessment Items TOOL 4 Quantity Score X- 1 0 Use when only an EandM is performed on FOLLOW-UP visit ASSESSMENTS - Nursing Assessment / Reassessment X- 1 10 Reassessment of Co-morbidities (includes updates in patient status) X- 1 5 Reassessment of Adherence to Treatment Plan ASSESSMENTS - Wound and Skin A ssessment / Reassessment X - Simple Wound Assessment / Reassessment - one wound 1 5 []  - 0 Complex Wound Assessment / Reassessment - multiple wounds X- 1 10 Dermatologic / Skin Assessment (not related to wound area) ASSESSMENTS - Focused Assessment X- 1 5 Circumferential Edema Measurements - multi extremities []  - 0 Nutritional Assessment / Counseling / Intervention []  - 0 Lower Extremity Assessment (monofilament, tuning fork, pulses) []  - 0 Peripheral Arterial Disease Assessment (using hand held doppler) ASSESSMENTS - Ostomy and/or Continence Assessment and Care []  - 0 Incontinence Assessment and Management []  - 0 Ostomy Care Assessment and Management (repouching, etc.) PROCESS - Coordination of Care ILYSSA, MOLESKI ([id number]) [id number] Page 2 of 8 []  - 0 Simple Patient / Family Education for ongoing care X- 1 20 Complex (extensive) Patient / Family Education for ongoing care X- 1 10 Staff obtains Chiropractor, Records, T Results / Process Orders est X- 1 10 Staff telephones HHA, Nursing Homes / Clarify orders / etc []  - 0 Routine Transfer to another Facility (non-emergent condition) []  - 0 Routine Hospital Admission (non-emergent condition) []  - 0 New Admissions / Manufacturing engineer / Ordering NPWT Apligraf, etc. , []  - 0 Emergency Hospital Admission (emergent condition) []  - 0 Simple Discharge Coordination X- 1 15 Complex (extensive) Discharge Coordination PROCESS - Special Needs []  - 0 Pediatric / Minor Patient Management []  - 0 Isolation Patient  Management []  - 0 Hearing / Language / Visual special needs []  - 0 Assessment of Community assistance (transportation, D/C planning, etc.) []  - 0 Additional assistance / Altered mentation []  - 0 Support Surface(s) Assessment (  bed, cushion, seat, etc.) INTERVENTIONS - Wound Cleansing / Measurement []  - 0 Simple Wound Cleansing - one wound X- 1 5 Complex Wound Cleansing - multiple wounds X- 1 5 Wound Imaging (photographs - any number of wounds) []  - 0 Wound Tracing (instead of photographs) []  - 0 Simple Wound Measurement - one wound X- 1 5 Complex Wound Measurement - multiple wounds INTERVENTIONS - Wound Dressings []  - 0 Small Wound Dressing one or multiple wounds []  - 0 Medium Wound Dressing one or multiple wounds X- 1 20 Large Wound Dressing one or multiple wounds []  - 0 Application of Medications - topical []  - 0 Application of Medications - injection INTERVENTIONS - Miscellaneous []  - 0 External ear exam []  - 0 Specimen Collection (cultures, biopsies, blood, body fluids, etc.) []  - 0 Specimen(s) / Culture(s) sent or taken to Lab for analysis []  - 0 Patient Transfer (multiple staff / Nurse, adult / Similar devices) []  - 0 Simple Staple / Suture removal (25 or less) []  - 0 Complex Staple / Suture removal (26 or more) []  - 0 Hypo / Hyperglycemic Management (close monitor of Blood Glucose) []  - 0 Ankle / Brachial Index (ABI) - do not check if billed separately X- 1 5 Vital Signs Has the patient been seen at the hospital within the last three years: Yes Total Score: 130 Level Of Care: New/Established - Level 4 Electronic Signature(s) Signed: 03/23/2023 5:08:59 PM By: Shawn Stall RN, BSN [place], Rolena Infante ([id number]) [id number] Page 3 of 8 Entered By: Shawn Stall on 03/20/2023 10:47:13 -------------------------------------------------------------------------------- Encounter Discharge Information Details Patient Name: Date of  Service: HA Heather [name] 03/20/2023 10:15 A M Medical Record Number: [id number] Patient Account Number: [id number] Date of Birth/Sex: Treating RN: 05-Feb-1918 (89 y.o. Arta Silence Primary Care Felissa Blouch: Richarda Blade Other Clinician: Referring Vella Colquitt: Treating Kenji Mapel/Extender: Geralyn Corwin Ngetich, Dinah Weeks in Treatment: 10 Encounter Discharge Information Items Discharge Condition: Stable Ambulatory Status: Wheelchair Discharge Destination: Home Transportation: Private Auto Accompanied By: son Schedule Follow-up Appointment: Yes Clinical Summary of Care: Electronic Signature(s) Signed: 03/23/2023 5:08:59 PM By: Shawn Stall RN, BSN Entered By: Shawn Stall on 03/20/2023 10:47:57 -------------------------------------------------------------------------------- Lower Extremity Assessment Details Patient Name: Date of Service: [name], DO RETHEA J. 03/20/2023 10:15 A M Medical Record Number: [id number] Patient Account Number: [id number] Date of Birth/Sex: Treating RN: 1918/04/23 (89 y.o. Arta Silence Primary Care Jen Eppinger: Richarda Blade Other Clinician: Referring Idrees Quam: Treating Katalyna Socarras/Extender: Geralyn Corwin Ngetich, Dinah Weeks in Treatment: 10 Edema Assessment Assessed: [Left: Yes] [Right: No] Edema: [Left: Ye] [Right: s] Calf Left: Right: Point of Measurement: 32 cm From Medial Instep 29 cm Ankle Left: Right: Point of Measurement: 10 cm From Medial Instep 25 cm Vascular Assessment Pulses: Dorsalis Pedis Palpable: [Left:Yes] Electronic Signature(s) Signed: 03/23/2023 5:08:59 PM By: Shawn Stall RN, BSN Entered By: Shawn Stall on 03/20/2023 10:26:27 -------------------------------------------------------------------------------- Multi Wound Chart Details Patient Name: Date of Service: [name], DO RETHEA J. 03/20/2023 10:15 A M Medical Record Number: [id number] Patient Account Number: [id number] Date of Birth/Sex: Treating RN: 1918/06/30  (89 y.o. F) Primary Care Rock Sobol: Richarda Blade Other Clinician: Referring Aishia Barkey: Treating Prisca Gearing/Extender: Mystery, Villatoro, Rolena Infante ([id number]) [id number] Page 4 of 8 Weeks in Treatment: 10 Vital Signs Height(in): Pulse(bpm): 81 Weight(lbs): Blood Pressure(mmHg): 116/69 Body Mass Index(BMI): Temperature(F): 99 Respiratory Rate(breaths/min): 20 [2:Photos:] [N/A:N/A] Left, Circumferential Lower Leg N/A N/A Wound Location: Gradually Appeared N/A N/A Wounding Event: Venous Leg Ulcer N/A N/A Primary Etiology: Hypertension, Peripheral Venous N/A N/A Comorbid  History: Disease 01/09/2023 N/A N/A Date Acquired: 10 N/A N/A Weeks of Treatment: Open N/A N/A Wound Status: No N/A N/A Wound Recurrence: 10x15x0.1 N/A N/A Measurements L x W x D (cm) 117.81 N/A N/A A (cm) : rea 11.781 N/A N/A Volume (cm) : 63.20% N/A N/A % Reduction in Area: 63.20% N/A N/A % Reduction in Volume: Full Thickness Without Exposed N/A N/A Classification: Support Structures Medium N/A N/A Exudate Amount: Serosanguineous N/A N/A Exudate Type: red, brown N/A N/A Exudate Color: Distinct, outline attached N/A N/A Wound Margin: Large (67-100%) N/A N/A Granulation Amount: Red, Pink N/A N/A Granulation Quality: Fascia: No N/A N/A Exposed Structures: Fat Layer (Subcutaneous Tissue): No Tendon: No Muscle: No Joint: No Bone: No Medium (34-66%) N/A N/A Epithelialization: Excoriation: No N/A N/A Periwound Skin Texture: Induration: No Callus: No Crepitus: No Rash: No Scarring: No Maceration: Yes N/A N/A Periwound Skin Moisture: Dry/Scaly: No Hemosiderin Staining: Yes N/A N/A Periwound Skin Color: Atrophie Blanche: No Cyanosis: No Ecchymosis: No Erythema: No Mottled: No Pallor: No Rubor: No No Abnormality N/A N/A Temperature: Yes N/A N/A Tenderness on Palpation: Treatment Notes Wound #2 (Lower Leg) Wound Laterality:  Left, Circumferential Cleanser Soap and Water Discharge Instruction: May shower and wash wound with dial antibacterial soap and water prior to dressing change. Peri-Wound Care Topical Triamcinolone Discharge Instruction: Apply to foot and mid lower leg daily. TONGA, DOMENICK ([id number]) [id number] Page 5 of 8 Primary Dressing Secondary Dressing Secured With Compression Wrap tubigrip size D Discharge Instruction: apply to left leg daily with dressing changes. One layer. Compression Stockings Add-Ons Electronic Signature(s) Signed: 03/20/2023 11:50:33 AM By: Geralyn Corwin DO Entered By: Geralyn Corwin on 03/20/2023 10:53:31 -------------------------------------------------------------------------------- Multi-Disciplinary Care Plan Details Patient Name: Date of Service: [name], DO RETHEA J. 03/20/2023 10:15 A M Medical Record Number: [id number] Patient Account Number: [id number] Date of Birth/Sex: Treating RN: May 31, 1918 (89 y.o. Arta Silence Primary Care Sri Clegg: Richarda Blade Other Clinician: Referring Taiga Lupinacci: Treating Miken Stecher/Extender: Geralyn Corwin Ngetich, Dinah Weeks in Treatment: 10 Active Inactive Wound/Skin Impairment Nursing Diagnoses: Impaired tissue integrity Knowledge deficit related to ulceration/compromised skin integrity Goals: Patient will have a decrease in wound volume by X% from date: (specify in notes) Date Initiated: 01/09/2023 Target Resolution Date: 04/17/2023 Goal Status: Active Patient/caregiver will verbalize understanding of skin care regimen Date Initiated: 01/09/2023 Target Resolution Date: 04/17/2023 Goal Status: Active Ulcer/skin breakdown will have a volume reduction of 30% by week 4 Date Initiated: 01/09/2023 Date Inactivated: 03/13/2023 Target Resolution Date: 03/21/2023 Unmet Reason: see wound Goal Status: Unmet measurements. Interventions: Assess patient/caregiver ability to obtain necessary  supplies Assess patient/caregiver ability to perform ulcer/skin care regimen upon admission and as needed Assess ulceration(s) every visit Notes: Electronic Signature(s) Signed: 03/23/2023 5:08:59 PM By: Shawn Stall RN, BSN Entered By: Shawn Stall on 03/20/2023 10:31:57 -------------------------------------------------------------------------------- Pain Assessment Details Patient Name: Date of Service: [name], DO RETHEA J. 03/20/2023 10:15 A M Medical Record Number: [id number] Patient Account Number: [id number] Date of Birth/Sex: Treating RN: [birth year] (89 y.o. Arta Silence Primary Care Graves Nipp: Richarda Blade Other Clinician: Referring Alphonsus Doyel: Treating Korbyn Vanes/Extender: Vaishnavi, [place], Rolena Infante ([id number]) [id number] Page 6 of 8 Weeks in Treatment: 10 Active Problems Location of Pain Severity and Description of Pain Patient Has Paino No Site Locations Rate the pain. Current Pain Level: 0 Pain Management and Medication Current Pain Management: Medication: No Cold Application: No Rest: No Massage: No Activity: No T.E.N.S.: No Heat Application: No Leg drop or elevation: No Is the Current Pain Management Adequate:  Adequate How does your wound impact your activities of daily livingo Sleep: No Bathing: No Appetite: No Relationship With Others: No Bladder Continence: No Emotions: No Bowel Continence: No Work: No Toileting: No Drive: No Dressing: No Hobbies: No Electronic Signature(s) Signed: 03/23/2023 5:08:59 PM By: Shawn Stall RN, BSN Entered By: Shawn Stall on 03/20/2023 10:25:07 -------------------------------------------------------------------------------- Patient/Caregiver Education Details Patient Name: Date of Service: [name], DO Janann Colonel 5/3/2024andnbsp10:15 A M Medical Record Number: [id number] Patient Account Number: [id number] Date of Birth/Gender: Treating RN: 1918/01/20 (89 y.o. Arta Silence Primary Care Physician: Richarda Blade Other Clinician: Referring Physician: Treating Physician/Extender: Jae Dire in Treatment: 10 Education Assessment Education Provided To: Patient Education Topics Provided Wound/Skin Impairment: Handouts: Caring for Your Ulcer Methods: Explain/Verbal Responses: Reinforcements needed Electronic Signature(s) Signed: 03/23/2023 5:08:59 PM By: Shawn Stall RN, BSN [place], Rolena Infante ([id number]) [id number] Page 7 of 8 Entered By: Shawn Stall on 03/20/2023 10:32:06 -------------------------------------------------------------------------------- Wound Assessment Details Patient Name: Date of Service: HA Heather [name] 03/20/2023 10:15 A M Medical Record Number: [id number] Patient Account Number: [id number] Date of Birth/Sex: Treating RN: 13-Sep-1918 (89 y.o. Arta Silence Primary Care Nicholai Willette: Richarda Blade Other Clinician: Referring Travis Purk: Treating Emani Morad/Extender: Geralyn Corwin Ngetich, Dinah Weeks in Treatment: 10 Wound Status Wound Number: 2 Primary Etiology: Venous Leg Ulcer Wound Location: Left, Circumferential Lower Leg Wound Status: Open Wounding Event: Gradually Appeared Comorbid History: Hypertension, Peripheral Venous Disease Date Acquired: 01/09/2023 Weeks Of Treatment: 10 Clustered Wound: No Photos Wound Measurements Length: (cm) 10 Width: (cm) 15 Depth: (cm) 0.1 Area: (cm) 117.81 Volume: (cm) 11.781 % Reduction in Area: 63.2% % Reduction in Volume: 63.2% Epithelialization: Medium (34-66%) Tunneling: No Undermining: No Wound Description Classification: Full Thickness Without Exposed Support Structures Wound Margin: Distinct, outline attached Exudate Amount: Medium Exudate Type: Serosanguineous Exudate Color: red, brown Foul Odor After Cleansing: No Slough/Fibrino No Wound Bed Granulation Amount: Large (67-100%) Exposed  Structure Granulation Quality: Red, Pink Fascia Exposed: No Fat Layer (Subcutaneous Tissue) Exposed: No Tendon Exposed: No Muscle Exposed: No Joint Exposed: No Bone Exposed: No Periwound Skin Texture Texture Color No Abnormalities Noted: No No Abnormalities Noted: No Callus: No Atrophie Blanche: No Crepitus: No Cyanosis: No Excoriation: No Ecchymosis: No Induration: No Erythema: No Rash: No Hemosiderin Staining: Yes Scarring: No Mottled: No Pallor: No Moisture Rubor: No No Abnormalities Noted: No Dry / Scaly: No Temperature / Pain Maceration: Yes Temperature: No Abnormality Tenderness on PalpationREIN, REINING ([id number]) [phone].pdf Page 8 of 8 Treatment Notes Wound #2 (Lower Leg) Wound Laterality: Left, Circumferential Cleanser Soap and Water Discharge Instruction: May shower and wash wound with dial antibacterial soap and water prior to dressing change. Peri-Wound Care Topical Triamcinolone Discharge Instruction: Apply to foot and mid lower leg daily. Primary Dressing Secondary Dressing Secured With Compression Wrap tubigrip size D Discharge Instruction: apply to left leg daily with dressing changes. One layer. Compression Stockings Add-Ons Electronic Signature(s) Signed: 03/23/2023 5:08:59 PM By: Shawn Stall RN, BSN Entered By: Shawn Stall on 03/20/2023 10:31:12 -------------------------------------------------------------------------------- Vitals Details Patient Name: Date of Service: [name], DO RETHEA J. 03/20/2023 10:15 A M Medical Record Number: [id number] Patient Account Number: [id number] Date of Birth/Sex: Treating RN: 12-23-1917 (89 y.o. Arta Silence Primary Care Joeanna Howdyshell: Richarda Blade Other Clinician: Referring Deen Deguia: Treating Jaymes Hang/Extender: Geralyn Corwin Ngetich, Dinah Weeks in Treatment: 10 Vital Signs Time Taken: 10:24 Temperature (F): 99 Pulse (bpm): 81 Respiratory Rate  (breaths/min): 20 Blood Pressure (mmHg): 116/69 Reference Range: 80 - 120 mg /  dl Electronic Signature(s) Signed: 03/23/2023 5:08:59 PM By: Shawn Stall RN, BSN Entered By: Shawn Stall on 03/20/2023 10:24:58

## 2023-03-26 DIAGNOSIS — M810 Age-related osteoporosis without current pathological fracture: Secondary | ICD-10-CM | POA: Diagnosis not present

## 2023-03-26 DIAGNOSIS — F411 Generalized anxiety disorder: Secondary | ICD-10-CM | POA: Diagnosis not present

## 2023-03-26 DIAGNOSIS — G301 Alzheimer's disease with late onset: Secondary | ICD-10-CM | POA: Diagnosis not present

## 2023-03-26 DIAGNOSIS — G894 Chronic pain syndrome: Secondary | ICD-10-CM | POA: Diagnosis not present

## 2023-03-26 DIAGNOSIS — K219 Gastro-esophageal reflux disease without esophagitis: Secondary | ICD-10-CM | POA: Diagnosis not present

## 2023-03-26 DIAGNOSIS — I110 Hypertensive heart disease with heart failure: Secondary | ICD-10-CM | POA: Diagnosis not present

## 2023-03-26 DIAGNOSIS — M47812 Spondylosis without myelopathy or radiculopathy, cervical region: Secondary | ICD-10-CM | POA: Diagnosis not present

## 2023-03-26 DIAGNOSIS — K5900 Constipation, unspecified: Secondary | ICD-10-CM | POA: Diagnosis not present

## 2023-03-26 DIAGNOSIS — L97821 Non-pressure chronic ulcer of other part of left lower leg limited to breakdown of skin: Secondary | ICD-10-CM | POA: Diagnosis not present

## 2023-03-26 DIAGNOSIS — I5032 Chronic diastolic (congestive) heart failure: Secondary | ICD-10-CM | POA: Diagnosis not present

## 2023-03-26 DIAGNOSIS — L97521 Non-pressure chronic ulcer of other part of left foot limited to breakdown of skin: Secondary | ICD-10-CM | POA: Diagnosis not present

## 2023-03-26 DIAGNOSIS — I4510 Unspecified right bundle-branch block: Secondary | ICD-10-CM | POA: Diagnosis not present

## 2023-03-26 DIAGNOSIS — I87312 Chronic venous hypertension (idiopathic) with ulcer of left lower extremity: Secondary | ICD-10-CM | POA: Diagnosis not present

## 2023-03-26 DIAGNOSIS — F0284 Dementia in other diseases classified elsewhere, unspecified severity, with anxiety: Secondary | ICD-10-CM | POA: Diagnosis not present

## 2023-03-26 DIAGNOSIS — J309 Allergic rhinitis, unspecified: Secondary | ICD-10-CM | POA: Diagnosis not present

## 2023-03-26 DIAGNOSIS — K5730 Diverticulosis of large intestine without perforation or abscess without bleeding: Secondary | ICD-10-CM | POA: Diagnosis not present

## 2023-03-30 DIAGNOSIS — I4510 Unspecified right bundle-branch block: Secondary | ICD-10-CM | POA: Diagnosis not present

## 2023-03-30 DIAGNOSIS — I5032 Chronic diastolic (congestive) heart failure: Secondary | ICD-10-CM | POA: Diagnosis not present

## 2023-03-30 DIAGNOSIS — J309 Allergic rhinitis, unspecified: Secondary | ICD-10-CM | POA: Diagnosis not present

## 2023-03-30 DIAGNOSIS — I87312 Chronic venous hypertension (idiopathic) with ulcer of left lower extremity: Secondary | ICD-10-CM | POA: Diagnosis not present

## 2023-03-30 DIAGNOSIS — M810 Age-related osteoporosis without current pathological fracture: Secondary | ICD-10-CM | POA: Diagnosis not present

## 2023-03-30 DIAGNOSIS — G301 Alzheimer's disease with late onset: Secondary | ICD-10-CM | POA: Diagnosis not present

## 2023-03-30 DIAGNOSIS — F0284 Dementia in other diseases classified elsewhere, unspecified severity, with anxiety: Secondary | ICD-10-CM | POA: Diagnosis not present

## 2023-03-30 DIAGNOSIS — L97521 Non-pressure chronic ulcer of other part of left foot limited to breakdown of skin: Secondary | ICD-10-CM | POA: Diagnosis not present

## 2023-03-30 DIAGNOSIS — K219 Gastro-esophageal reflux disease without esophagitis: Secondary | ICD-10-CM | POA: Diagnosis not present

## 2023-03-30 DIAGNOSIS — I110 Hypertensive heart disease with heart failure: Secondary | ICD-10-CM | POA: Diagnosis not present

## 2023-03-30 DIAGNOSIS — K5730 Diverticulosis of large intestine without perforation or abscess without bleeding: Secondary | ICD-10-CM | POA: Diagnosis not present

## 2023-03-30 DIAGNOSIS — F411 Generalized anxiety disorder: Secondary | ICD-10-CM | POA: Diagnosis not present

## 2023-03-30 DIAGNOSIS — G894 Chronic pain syndrome: Secondary | ICD-10-CM | POA: Diagnosis not present

## 2023-03-30 DIAGNOSIS — K5900 Constipation, unspecified: Secondary | ICD-10-CM | POA: Diagnosis not present

## 2023-03-30 DIAGNOSIS — L97821 Non-pressure chronic ulcer of other part of left lower leg limited to breakdown of skin: Secondary | ICD-10-CM | POA: Diagnosis not present

## 2023-03-30 DIAGNOSIS — M47812 Spondylosis without myelopathy or radiculopathy, cervical region: Secondary | ICD-10-CM | POA: Diagnosis not present

## 2023-04-01 ENCOUNTER — Other Ambulatory Visit: Payer: Self-pay

## 2023-04-01 DIAGNOSIS — G894 Chronic pain syndrome: Secondary | ICD-10-CM | POA: Diagnosis not present

## 2023-04-01 DIAGNOSIS — I4510 Unspecified right bundle-branch block: Secondary | ICD-10-CM | POA: Diagnosis not present

## 2023-04-01 DIAGNOSIS — I5032 Chronic diastolic (congestive) heart failure: Secondary | ICD-10-CM | POA: Diagnosis not present

## 2023-04-01 DIAGNOSIS — K5730 Diverticulosis of large intestine without perforation or abscess without bleeding: Secondary | ICD-10-CM | POA: Diagnosis not present

## 2023-04-01 DIAGNOSIS — I110 Hypertensive heart disease with heart failure: Secondary | ICD-10-CM | POA: Diagnosis not present

## 2023-04-01 DIAGNOSIS — L97821 Non-pressure chronic ulcer of other part of left lower leg limited to breakdown of skin: Secondary | ICD-10-CM | POA: Diagnosis not present

## 2023-04-01 DIAGNOSIS — K219 Gastro-esophageal reflux disease without esophagitis: Secondary | ICD-10-CM | POA: Diagnosis not present

## 2023-04-01 DIAGNOSIS — K5900 Constipation, unspecified: Secondary | ICD-10-CM | POA: Diagnosis not present

## 2023-04-01 DIAGNOSIS — M47812 Spondylosis without myelopathy or radiculopathy, cervical region: Secondary | ICD-10-CM | POA: Diagnosis not present

## 2023-04-01 DIAGNOSIS — G301 Alzheimer's disease with late onset: Secondary | ICD-10-CM | POA: Diagnosis not present

## 2023-04-01 DIAGNOSIS — L97521 Non-pressure chronic ulcer of other part of left foot limited to breakdown of skin: Secondary | ICD-10-CM | POA: Diagnosis not present

## 2023-04-01 DIAGNOSIS — F0284 Dementia in other diseases classified elsewhere, unspecified severity, with anxiety: Secondary | ICD-10-CM | POA: Diagnosis not present

## 2023-04-01 DIAGNOSIS — J309 Allergic rhinitis, unspecified: Secondary | ICD-10-CM | POA: Diagnosis not present

## 2023-04-01 DIAGNOSIS — M810 Age-related osteoporosis without current pathological fracture: Secondary | ICD-10-CM | POA: Diagnosis not present

## 2023-04-01 DIAGNOSIS — F411 Generalized anxiety disorder: Secondary | ICD-10-CM | POA: Diagnosis not present

## 2023-04-01 DIAGNOSIS — I87312 Chronic venous hypertension (idiopathic) with ulcer of left lower extremity: Secondary | ICD-10-CM | POA: Diagnosis not present

## 2023-04-01 MED ORDER — FUROSEMIDE 20 MG PO TABS: 10.0000 mg | ORAL_TABLET | Freq: Every day | ORAL | 1 refills | Status: DC

## 2023-04-03 ENCOUNTER — Encounter (HOSPITAL_BASED_OUTPATIENT_CLINIC_OR_DEPARTMENT_OTHER): Payer: Medicare Other | Admitting: Internal Medicine

## 2023-04-03 DIAGNOSIS — L97821 Non-pressure chronic ulcer of other part of left lower leg limited to breakdown of skin: Secondary | ICD-10-CM | POA: Diagnosis not present

## 2023-04-03 DIAGNOSIS — I739 Peripheral vascular disease, unspecified: Secondary | ICD-10-CM | POA: Diagnosis not present

## 2023-04-03 DIAGNOSIS — I10 Essential (primary) hypertension: Secondary | ICD-10-CM

## 2023-04-03 DIAGNOSIS — L97929 Non-pressure chronic ulcer of unspecified part of left lower leg with unspecified severity: Secondary | ICD-10-CM | POA: Diagnosis not present

## 2023-04-03 DIAGNOSIS — I5032 Chronic diastolic (congestive) heart failure: Secondary | ICD-10-CM | POA: Diagnosis not present

## 2023-04-03 DIAGNOSIS — I87312 Chronic venous hypertension (idiopathic) with ulcer of left lower extremity: Secondary | ICD-10-CM | POA: Diagnosis not present

## 2023-04-06 DIAGNOSIS — I110 Hypertensive heart disease with heart failure: Secondary | ICD-10-CM | POA: Diagnosis not present

## 2023-04-06 DIAGNOSIS — I4510 Unspecified right bundle-branch block: Secondary | ICD-10-CM | POA: Diagnosis not present

## 2023-04-06 DIAGNOSIS — M810 Age-related osteoporosis without current pathological fracture: Secondary | ICD-10-CM | POA: Diagnosis not present

## 2023-04-06 DIAGNOSIS — K219 Gastro-esophageal reflux disease without esophagitis: Secondary | ICD-10-CM | POA: Diagnosis not present

## 2023-04-06 DIAGNOSIS — I87312 Chronic venous hypertension (idiopathic) with ulcer of left lower extremity: Secondary | ICD-10-CM | POA: Diagnosis not present

## 2023-04-06 DIAGNOSIS — J309 Allergic rhinitis, unspecified: Secondary | ICD-10-CM | POA: Diagnosis not present

## 2023-04-06 DIAGNOSIS — L97521 Non-pressure chronic ulcer of other part of left foot limited to breakdown of skin: Secondary | ICD-10-CM | POA: Diagnosis not present

## 2023-04-06 DIAGNOSIS — G894 Chronic pain syndrome: Secondary | ICD-10-CM | POA: Diagnosis not present

## 2023-04-06 DIAGNOSIS — I5032 Chronic diastolic (congestive) heart failure: Secondary | ICD-10-CM | POA: Diagnosis not present

## 2023-04-06 DIAGNOSIS — M47812 Spondylosis without myelopathy or radiculopathy, cervical region: Secondary | ICD-10-CM | POA: Diagnosis not present

## 2023-04-06 DIAGNOSIS — K5900 Constipation, unspecified: Secondary | ICD-10-CM | POA: Diagnosis not present

## 2023-04-06 DIAGNOSIS — K5730 Diverticulosis of large intestine without perforation or abscess without bleeding: Secondary | ICD-10-CM | POA: Diagnosis not present

## 2023-04-06 DIAGNOSIS — L97821 Non-pressure chronic ulcer of other part of left lower leg limited to breakdown of skin: Secondary | ICD-10-CM | POA: Diagnosis not present

## 2023-04-06 DIAGNOSIS — F0284 Dementia in other diseases classified elsewhere, unspecified severity, with anxiety: Secondary | ICD-10-CM | POA: Diagnosis not present

## 2023-04-06 DIAGNOSIS — G301 Alzheimer's disease with late onset: Secondary | ICD-10-CM | POA: Diagnosis not present

## 2023-04-06 DIAGNOSIS — F411 Generalized anxiety disorder: Secondary | ICD-10-CM | POA: Diagnosis not present

## 2023-04-08 DIAGNOSIS — I87312 Chronic venous hypertension (idiopathic) with ulcer of left lower extremity: Secondary | ICD-10-CM | POA: Diagnosis not present

## 2023-04-08 DIAGNOSIS — K5900 Constipation, unspecified: Secondary | ICD-10-CM | POA: Diagnosis not present

## 2023-04-08 DIAGNOSIS — L97521 Non-pressure chronic ulcer of other part of left foot limited to breakdown of skin: Secondary | ICD-10-CM | POA: Diagnosis not present

## 2023-04-08 DIAGNOSIS — G894 Chronic pain syndrome: Secondary | ICD-10-CM | POA: Diagnosis not present

## 2023-04-08 DIAGNOSIS — M810 Age-related osteoporosis without current pathological fracture: Secondary | ICD-10-CM | POA: Diagnosis not present

## 2023-04-08 DIAGNOSIS — I110 Hypertensive heart disease with heart failure: Secondary | ICD-10-CM | POA: Diagnosis not present

## 2023-04-08 DIAGNOSIS — L97821 Non-pressure chronic ulcer of other part of left lower leg limited to breakdown of skin: Secondary | ICD-10-CM | POA: Diagnosis not present

## 2023-04-08 DIAGNOSIS — K219 Gastro-esophageal reflux disease without esophagitis: Secondary | ICD-10-CM | POA: Diagnosis not present

## 2023-04-08 DIAGNOSIS — J309 Allergic rhinitis, unspecified: Secondary | ICD-10-CM | POA: Diagnosis not present

## 2023-04-08 DIAGNOSIS — G301 Alzheimer's disease with late onset: Secondary | ICD-10-CM | POA: Diagnosis not present

## 2023-04-08 DIAGNOSIS — F411 Generalized anxiety disorder: Secondary | ICD-10-CM | POA: Diagnosis not present

## 2023-04-08 DIAGNOSIS — F0284 Dementia in other diseases classified elsewhere, unspecified severity, with anxiety: Secondary | ICD-10-CM | POA: Diagnosis not present

## 2023-04-08 DIAGNOSIS — I4510 Unspecified right bundle-branch block: Secondary | ICD-10-CM | POA: Diagnosis not present

## 2023-04-08 DIAGNOSIS — K5730 Diverticulosis of large intestine without perforation or abscess without bleeding: Secondary | ICD-10-CM | POA: Diagnosis not present

## 2023-04-08 DIAGNOSIS — M47812 Spondylosis without myelopathy or radiculopathy, cervical region: Secondary | ICD-10-CM | POA: Diagnosis not present

## 2023-04-08 DIAGNOSIS — I5032 Chronic diastolic (congestive) heart failure: Secondary | ICD-10-CM | POA: Diagnosis not present

## 2023-04-17 ENCOUNTER — Encounter (HOSPITAL_BASED_OUTPATIENT_CLINIC_OR_DEPARTMENT_OTHER): Payer: Medicare Other | Admitting: Internal Medicine

## 2023-04-17 DIAGNOSIS — I5032 Chronic diastolic (congestive) heart failure: Secondary | ICD-10-CM | POA: Diagnosis not present

## 2023-04-17 DIAGNOSIS — I87312 Chronic venous hypertension (idiopathic) with ulcer of left lower extremity: Secondary | ICD-10-CM | POA: Diagnosis not present

## 2023-04-17 DIAGNOSIS — L97929 Non-pressure chronic ulcer of unspecified part of left lower leg with unspecified severity: Secondary | ICD-10-CM | POA: Diagnosis not present

## 2023-04-17 DIAGNOSIS — I739 Peripheral vascular disease, unspecified: Secondary | ICD-10-CM | POA: Diagnosis not present

## 2023-04-17 DIAGNOSIS — L97821 Non-pressure chronic ulcer of other part of left lower leg limited to breakdown of skin: Secondary | ICD-10-CM | POA: Diagnosis not present

## 2023-04-17 DIAGNOSIS — I10 Essential (primary) hypertension: Secondary | ICD-10-CM | POA: Diagnosis not present

## 2023-06-08 ENCOUNTER — Other Ambulatory Visit: Payer: Medicare Other

## 2023-06-08 ENCOUNTER — Ambulatory Visit: Payer: Medicare Other | Admitting: Podiatry

## 2023-06-08 ENCOUNTER — Other Ambulatory Visit: Payer: Self-pay | Admitting: Family

## 2023-06-08 DIAGNOSIS — I5032 Chronic diastolic (congestive) heart failure: Secondary | ICD-10-CM

## 2023-06-08 DIAGNOSIS — F411 Generalized anxiety disorder: Secondary | ICD-10-CM | POA: Diagnosis not present

## 2023-06-08 DIAGNOSIS — I110 Hypertensive heart disease with heart failure: Secondary | ICD-10-CM

## 2023-06-08 LAB — CBC WITH DIFFERENTIAL/PLATELET
Absolute Monocytes: 820 cells/uL (ref 200–950)
Eosinophils Relative: 1.3 %
MCH: 30.3 pg (ref 27.0–33.0)
MCHC: 32.4 g/dL (ref 32.0–36.0)
MPV: 9.3 fL (ref 7.5–12.5)
Monocytes Relative: 14.9 %
Platelets: 239 10*3/uL (ref 140–400)
RBC: 4.13 10*6/uL (ref 3.80–5.10)
Total Lymphocyte: 20.3 %

## 2023-06-09 ENCOUNTER — Ambulatory Visit: Payer: Medicare Other | Admitting: Podiatry

## 2023-06-09 ENCOUNTER — Encounter: Payer: Self-pay | Admitting: Podiatry

## 2023-06-09 DIAGNOSIS — M79674 Pain in right toe(s): Secondary | ICD-10-CM

## 2023-06-09 DIAGNOSIS — M79675 Pain in left toe(s): Secondary | ICD-10-CM | POA: Diagnosis not present

## 2023-06-09 DIAGNOSIS — B351 Tinea unguium: Secondary | ICD-10-CM | POA: Diagnosis not present

## 2023-06-09 DIAGNOSIS — I872 Venous insufficiency (chronic) (peripheral): Secondary | ICD-10-CM

## 2023-06-09 DIAGNOSIS — L97521 Non-pressure chronic ulcer of other part of left foot limited to breakdown of skin: Secondary | ICD-10-CM | POA: Diagnosis not present

## 2023-06-09 LAB — CBC WITH DIFFERENTIAL/PLATELET
Basophils Absolute: 28 cells/uL (ref 0–200)
Basophils Relative: 0.5 %
Eosinophils Absolute: 72 cells/uL (ref 15–500)
HCT: 38.6 % (ref 35.0–45.0)
Hemoglobin: 12.5 g/dL (ref 11.7–15.5)
Lymphs Abs: 1117 cells/uL (ref 850–3900)
MCV: 93.5 fL (ref 80.0–100.0)
Neutro Abs: 3465 cells/uL (ref 1500–7800)
Neutrophils Relative %: 63 %
RDW: 12.8 % (ref 11.0–15.0)
WBC: 5.5 10*3/uL (ref 3.8–10.8)

## 2023-06-09 LAB — LIPID PANEL
Cholesterol: 174 mg/dL (ref <200)
HDL: 73 mg/dL (ref >=50)
LDL Cholesterol (Calc): 89 mg/dL (calc)
Non-HDL Cholesterol (Calc): 101 mg/dL (calc) (ref <130)
Total CHOL/HDL Ratio: 2.4 (calc) (ref <5.0)
Triglycerides: 42 mg/dL (ref <150)

## 2023-06-09 LAB — COMPLETE METABOLIC PANEL WITH GFR
AG Ratio: 1.3 (calc) (ref 1.0–2.5)
ALT: 18 U/L (ref 6–29)
AST: 23 U/L (ref 10–35)
Albumin: 3.6 g/dL (ref 3.6–5.1)
Alkaline phosphatase (APISO): 44 U/L (ref 37–153)
BUN/Creatinine Ratio: 27 (calc) — ABNORMAL HIGH (ref 6–22)
BUN: 26 mg/dL — ABNORMAL HIGH (ref 7–25)
CO2: 31 mmol/L (ref 20–32)
Calcium: 9.5 mg/dL (ref 8.6–10.4)
Chloride: 105 mmol/L (ref 98–110)
Creat: 0.98 mg/dL — ABNORMAL HIGH (ref 0.60–0.95)
Globulin: 2.7 g/dL (calc) (ref 1.9–3.7)
Glucose, Bld: 88 mg/dL (ref 65–99)
Potassium: 4.7 mmol/L (ref 3.5–5.3)
Sodium: 142 mmol/L (ref 135–146)
Total Bilirubin: 0.5 mg/dL (ref 0.2–1.2)
Total Protein: 6.3 g/dL (ref 6.1–8.1)
eGFR: 51 mL/min/{1.73_m2} — ABNORMAL LOW (ref >=60)

## 2023-06-09 LAB — TSH: TSH: 2.55 mIU/L (ref 0.40–4.50)

## 2023-06-09 MED ORDER — MUPIROCIN 2 % EX OINT: TOPICAL_OINTMENT | CUTANEOUS | 1 refills | Status: DC

## 2023-06-12 ENCOUNTER — Ambulatory Visit: Payer: Medicare Other | Admitting: Family

## 2023-06-12 ENCOUNTER — Encounter: Payer: Self-pay | Admitting: Family

## 2023-06-12 VITALS — BP 100/58 | HR 63 | Temp 97.8°F | Resp 16 | Ht <= 58 in | Wt 110.0 lb

## 2023-06-12 DIAGNOSIS — I110 Hypertensive heart disease with heart failure: Secondary | ICD-10-CM | POA: Diagnosis not present

## 2023-06-12 DIAGNOSIS — R600 Localized edema: Secondary | ICD-10-CM

## 2023-06-12 DIAGNOSIS — R350 Frequency of micturition: Secondary | ICD-10-CM

## 2023-06-12 DIAGNOSIS — I872 Venous insufficiency (chronic) (peripheral): Secondary | ICD-10-CM

## 2023-06-12 DIAGNOSIS — F411 Generalized anxiety disorder: Secondary | ICD-10-CM

## 2023-06-12 DIAGNOSIS — L98499 Non-pressure chronic ulcer of skin of other sites with unspecified severity: Secondary | ICD-10-CM

## 2023-06-12 DIAGNOSIS — S90821A Blister (nonthermal), right foot, initial encounter: Secondary | ICD-10-CM

## 2023-06-12 DIAGNOSIS — I5032 Chronic diastolic (congestive) heart failure: Secondary | ICD-10-CM

## 2023-06-12 NOTE — Addendum Note (Signed)
Addended by: Dicky Doe on: 06/12/2023 03:43 PM   Modules accepted: Orders

## 2023-06-12 NOTE — Progress Notes (Signed)
Provider: Richarda Blade FNP-C   Heather [name], Heather Citrin, NP  Patient Care Team: Shaan Rhoads, Heather Citrin, NP as PCP - General (Family Medicine) Wendall Stade, MD as PCP - Cardiology (Cardiology) Gibson Ramp, MD as Referring Physician (Internal Medicine) Valeria Batman, MD (Inactive) as Attending Physician (Orthopedic Surgery) Olivia Canter, MD as Consulting Physician (Ophthalmology) Michele Mcalpine, MD as Consulting Physician (Pulmonary Disease)  Extended Emergency Contact Information Primary Emergency Contact: Rau,Michael Address: [address]          [place], [place] [address] [place] Home Phone: [phone] Mobile Phone: [phone] Relation: Son Secondary Emergency Contact: [facility] Phone: [phone] Relation: Other Preferred language: English Interpreter needed? No  Code Status:  Full Code  Goals of care: Advanced Directive information    12/05/2022    3:51 PM  Advanced Directives  Does Patient Have a Medical Advance Directive? Yes  Type of Advance Directive Living will;Out of facility DNR (pink MOST or yellow form)     Chief Complaint  Patient presents with   Medical Management of Chronic Issues    Patient is here for a 38M follow up for chronic conditions     HPI:  Pt is a 89 y.o. female seen today for 6 months follow up for medical management of chronic diseases. She is here with her son. Has upcoming Birthday turning 89 [facility] staff gathered and sung Happy Birthday to her.Patient very happy and appreciative.   Recent lab results reviewed and discussed during visit lab work unremarkable except BUN 26 CR 0.98   She complains of dysuria x 3 days states hurts so bad.Also has frequency . Also complains of right top of the foot painful blister area and left leg ulcer weeping.Has not been taking her diuretic per son.she denies any shortness of breath or dyspnea.   Past Medical History:  Diagnosis Date   Abdominal pain, left lower  quadrant 06/22/2013   Abnormal CT scan of lung 07/16/2015   Allergic rhinitis, cause unspecified 02/21/2014   Anxiety state, unspecified    Breast cancer (HCC)    NO STICK OR BLOOD PRESSURE CHECKS IN LEFT ARM   Carpal tunnel syndrome    Carpal tunnel syndrome on both sides 10/03/2008   Qualifier: Diagnosis of  By: Kriste Basque MD, Lonzo Cloud    Cervical spondylosis    Chronic pain syndrome 03/03/2019   Colon polyp 12/19/2009   Transverse-polypoid colorectal mucosa   Constipation    Cough 10/21/2014   Degenerative joint disease    Diastolic dysfunction    Diverticula of colon    Diverticulosis of colon 05/24/2002   Qualifier: Diagnosis of  By: Thereasa Solo     Gastritis, chronic    Hiatal hernia    Hypertension    Low back pain    LPRD (laryngopharyngeal reflux disease) 04/04/2014   Osteoporosis 05/22/2020   RBBB (right bundle branch block with left anterior fascicular block)    Renal cyst    Venous insufficiency    Past Surgical History:  Procedure Laterality Date   CARPAL TUNNEL RELEASE     INTRAMEDULLARY (IM) NAIL INTERTROCHANTERIC Right 11/13/2021   Procedure: INTRAMEDULLARY (IM) NAIL INTERTROCHANTRIC;  Surgeon: Huel Cote, MD;  Location: [facility];  Service: Orthopedics;  Laterality: Right;   Left mastectomy      Allergies  Allergen Reactions   Fentanyl Nausea Only and Other (See Comments)    Pt states that she is not allergic     Naprosyn [Naproxen] Other (See Comments)  Patient felt tongue and face swelling. Went to ED where no visible swelling noted    Allergies as of 06/12/2023       Reactions   Fentanyl Nausea Only, Other (See Comments)   Pt states that she is not allergic     Naprosyn [naproxen] Other (See Comments)   Patient felt tongue and face swelling. Went to ED where no visible swelling noted        Medication List        Accurate as of June 12, 2023 11:19 AM. If you have any questions, ask your nurse or doctor.          acetaminophen 500 MG  tablet Commonly known as: TYLENOL Take 500 mg by mouth every 6 (six) hours as needed for moderate pain or headache.   carvedilol 3.125 MG tablet Commonly known as: COREG Take 1 tablet (3.125 mg total) by mouth 2 (two) times daily. Hold for SBP <110 or HR <70   cholecalciferol 1000 units tablet Commonly known as: VITAMIN D Take 1,000 Units by mouth daily.   famotidine 20 MG tablet Commonly known as: PEPCID Take 1 tablet (20 mg total) by mouth daily.   feeding supplement Liqd Take 237 mLs by mouth 2 (two) times daily between meals.   furosemide 20 MG tablet Commonly known as: LASIX Take 0.5 tablets (10 mg total) by mouth daily.   lidocaine 5 % Commonly known as: LIDODERM Place 1 patch onto the skin as needed. Remove & Discard patch within 12 hours or as directed by MD   LORazepam 0.5 MG tablet Commonly known as: ATIVAN Take 0.5 mg by mouth as needed.   multivitamins ther. w/minerals Tabs tablet Take 1 tablet by mouth daily.   mupirocin ointment 2 % Commonly known as: BACTROBAN Apply to top of left foot once daily every morning.   polyethylene glycol 17 g packet Commonly known as: MIRALAX / GLYCOLAX Take 17 g by mouth daily.   sennosides-docusate sodium 8.6-50 MG tablet Commonly known as: SENOKOT-S Take 1-2 tablets by mouth daily as needed for constipation.   spironolactone 25 MG tablet Commonly known as: ALDACTONE Take 1 tablet by mouth once daily   SYSTANE COMPLETE OP Apply 1 drop to eye daily as needed (dry eyes).        Review of Systems  Constitutional:  Negative for appetite change, chills, fatigue, fever and unexpected weight change.  HENT:  Positive for hearing loss. Negative for congestion, dental problem, ear discharge, ear pain, facial swelling, nosebleeds, postnasal drip, rhinorrhea, sinus pressure, sinus pain, sneezing, sore throat, tinnitus and trouble swallowing.   Eyes:  Negative for pain, discharge, redness, itching and visual disturbance.   Respiratory:  Negative for cough, chest tightness, shortness of breath and wheezing.   Cardiovascular:  Negative for chest pain, palpitations and leg swelling.  Gastrointestinal:  Negative for abdominal distention, abdominal pain, blood in stool, constipation, diarrhea, nausea and vomiting.  Endocrine: Negative for cold intolerance, heat intolerance, polydipsia, polyphagia and polyuria.  Genitourinary:  Positive for dysuria and frequency. Negative for difficulty urinating, flank pain and urgency.       Urine Incontinent   Musculoskeletal:  Positive for arthralgias and gait problem. Negative for back pain, joint swelling, myalgias, neck pain and neck stiffness.  Skin:  Positive for wound. Negative for color change, pallor and rash.       Right dorsal foot blister  Left lateral lower extremities superficial ulcer   Neurological:  Negative for dizziness, syncope, speech difficulty,  weakness, light-headedness, numbness and headaches.  Hematological:  Does not bruise/bleed easily.  Psychiatric/Behavioral:  Negative for agitation, behavioral problems, confusion, hallucinations and sleep disturbance. The patient is not nervous/anxious.     Immunization History  Administered Date(s) Administered   Fluad Quad(high Dose 65+) 08/19/2021   Influenza, High Dose Seasonal PF 08/21/2015   Influenza,inj,Quad PF,6+ Mos 09/18/2015   PFIZER(Purple Top)SARS-COV-2 Vaccination 04/18/2020, 05/10/2020   Pneumococcal Polysaccharide-23 02/07/2015   Pertinent  Health Maintenance Due  Topic Date Due   INFLUENZA VACCINE  06/18/2023   DEXA SCAN  Completed      01/22/2022    1:08 PM 05/29/2022    3:07 PM 07/08/2022    2:05 PM 08/21/2022    1:56 PM 12/05/2022    3:50 PM  Fall Risk  Falls in the past year? 1 0 1 0 1  Was there an injury with Fall? 1 0 0 0 1  Fall Risk Category Calculator 2 0 1 0 3  Fall Risk Category (Retired) Moderate Low Low Low   (RETIRED) Patient Fall Risk Level Moderate fall risk Low fall  risk Low fall risk Low fall risk   Patient at Risk for Falls Due to History of fall(s) No Fall Risks History of fall(s);Impaired balance/gait;Impaired mobility No Fall Risks History of fall(s);Impaired balance/gait;Impaired mobility  Fall risk Follow up Falls evaluation completed;Education provided;Falls prevention discussed Falls evaluation completed Falls evaluation completed;Education provided;Falls prevention discussed Falls evaluation completed Falls evaluation completed;Education provided;Falls prevention discussed   Functional Status Survey:    Vitals:   06/12/23 1026  Height: 4\' 10"  (1.473 m)   Body mass index is 22.99 kg/m. Physical Exam Vitals reviewed.  Constitutional:      General: She is not in acute distress.    Appearance: Normal appearance. She is normal weight. She is not ill-appearing or diaphoretic.  HENT:     Head: Normocephalic.     Right Ear: Tympanic membrane, ear canal and external ear normal. There is no impacted cerumen.     Left Ear: Tympanic membrane, ear canal and external ear normal. There is no impacted cerumen.     Nose: Nose normal. No congestion or rhinorrhea.     Mouth/Throat:     Mouth: Mucous membranes are moist.     Pharynx: Oropharynx is clear. No oropharyngeal exudate or posterior oropharyngeal erythema.  Eyes:     General: No scleral icterus.       Right eye: No discharge.        Left eye: No discharge.     Extraocular Movements: Extraocular movements intact.     Conjunctiva/sclera: Conjunctivae normal.     Pupils: Pupils are equal, round, and reactive to light.  Neck:     Vascular: No carotid bruit.  Cardiovascular:     Rate and Rhythm: Normal rate and regular rhythm.     Pulses: Normal pulses.     Heart sounds: Normal heart sounds. No murmur heard.    No friction rub. No gallop.  Pulmonary:     Effort: Pulmonary effort is normal. No respiratory distress.     Breath sounds: Normal breath sounds. No wheezing, rhonchi or rales.   Chest:     Chest wall: No tenderness.  Abdominal:     General: Bowel sounds are normal. There is no distension.     Palpations: Abdomen is soft. There is no mass.     Tenderness: There is no abdominal tenderness. There is no right CVA tenderness, left CVA tenderness, guarding or rebound.  Musculoskeletal:        General: No swelling or tenderness. Normal range of motion.     Cervical back: Normal range of motion. No rigidity or tenderness.     Right lower leg: Edema present.     Left lower leg: Edema present.  Lymphadenopathy:     Cervical: No cervical adenopathy.  Skin:    General: Skin is warm and dry.     Coloration: Skin is not pale.     Findings: No bruising, erythema, lesion or rash.     Comments: Right dorsal foot blister tender touch  Left lateral lower extremities superficial ulcer weeping noted on Tubigrip.   Neurological:     Mental Status: She is alert and oriented to person, place, and time.     Cranial Nerves: No cranial nerve deficit.     Sensory: No sensory deficit.     Motor: No weakness.     Coordination: Coordination normal.     Gait: Gait abnormal.  Psychiatric:        Mood and Affect: Mood normal.        Speech: Speech normal.        Behavior: Behavior normal.        Thought Content: Thought content normal.        Judgment: Judgment normal.     Labs reviewed: Recent Labs    06/08/23 1133  NA 142  K 4.7  CL 105  CO2 31  GLUCOSE 88  BUN 26*  CREATININE 0.98*  CALCIUM 9.5   Recent Labs    06/08/23 1133  AST 23  ALT 18  BILITOT 0.5  PROT 6.3   Recent Labs    06/08/23 1133  WBC 5.5  NEUTROABS 3,465  HGB 12.5  HCT 38.6  MCV 93.5  PLT 239   Lab Results  Component Value Date   TSH 2.55 06/08/2023   No results found for: "HGBA1C" Lab Results  Component Value Date   CHOL 174 06/08/2023   HDL 73 06/08/2023   LDLCALC 89 06/08/2023   TRIG 42 06/08/2023   CHOLHDL 2.4 06/08/2023    Significant Diagnostic Results in last 30 days:   No results found.  Assessment/Plan  1. Urine frequency Afebrile  Reports dysuria and urine frequency x 3 days.unable to provide urine specimen during visit   2. Hypertensive heart disease with chronic diastolic congestive heart failure (HCC) B/p well controlled  - continue on spironolactone,carvedilol and Furosemide  - Lipid panel; Future - TSH; Future - COMPLETE METABOLIC PANEL WITH GFR; Future - CBC with Differential/Platelet; Future  3. Chronic diastolic congestive heart failure (HCC) Unable to check weight during visit due to gait instability  - bilateral lower extremities edema with weeping on left leg noted. - Advised to take Furosemide 10 mg daily.Has not been taking diuretic per son.   4. Generalized anxiety disorder Stable  - continue on Lorazepam   5. Venous (peripheral) insufficiency Continue bilateral knee high Tubigrip on in the morning and off at bedtime  - advised to take Furosemide 10 mg tablet as prescribed - encourage to keep legs elevated when seated.   6. Edema of both lower extremities Furosemide as above   7. Blister of right foot, initial encounter Will have home health Nurse to evaluate  - Ambulatory referral to Home Health  8. Superficial ulcer (HCC) HHN for wound care. - Ambulatory referral to Home Health  Family/ staff Communication: Reviewed plan of care with patient and son verbalized understanding  Labs/tests ordered:  - Lipid panel; Future - TSH; Future - COMPLETE METABOLIC PANEL WITH GFR; Future - CBC with Differential/Platelet; Future  Next Appointment : Return in about 6 months (around 12/13/2023) for medical mangement of chronic issues., Fasting labs in 6 months prior to visit.   Caesar Bookman, NP

## 2023-06-14 NOTE — Progress Notes (Signed)
  Subjective:  Patient ID: Heather [name], female    DOB: [birth year]  MRN: [id number]  Heather [name] presents to clinic today for at risk foot care. Patient has h/o PAD and painful elongated mycotic toenails 1-5 bilaterally which are tender when wearing enclosed shoe gear. Pain is relieved with periodic professional debridement. Her son is present during today's visit. She hasn't been taking her diuretics or elevating her feet as she should. She has been seen by [facility] in the past for LE wounds. Chief Complaint  Patient presents with   NAIL CARE    RFC   New problem(s): None.   PCP is Ngetich, Donalee Citrin, NP.  Allergies  Allergen Reactions   Fentanyl Nausea Only and Other (See Comments)    Pt states that she is not allergic     Naprosyn [Naproxen] Other (See Comments)    Patient felt tongue and face swelling. Went to ED where no visible swelling noted    Review of Systems: Negative except as noted in the HPI.  Objective: No changes noted in today's physical examination. There were no vitals filed for this visit. Heather [name] is a pleasant 89 y.o. female in NAD. AAO x 3.  Vascular Examination: CFT <4 seconds b/l. DP pulses diminished b/l. PT pulses diminished b/l. Digital hair absent. Skin temperature gradient warm to cool b/l. No ischemia or gangrene. No cyanosis or clubbing noted b/l. Nonpitting edema noted BLE.   Neurological Examination: Sensation grossly intact b/l with 10 gram monofilament. Vibratory sensation intact b/l.   Dermatological Examination: Pedal skin thin, shiny and atrophic b/l. No open wounds. No interdigital macerations.   Toenails 1-5 b/l thick, discolored, elongated with subungual debris and pain on dorsal palpation.   Shallow ulcer dorsal aspect left foot 0.2 cm x 0.2 x 0.1 cm..  Musculoskeletal Examination:  Muscle strength 5/5 to all lower extremity muscle groups bilaterally. Pes planus deformity noted bilateral LE.  Radiographs:  None  Assessment/Plan: 1. Pain due to onychomycosis of toenails of both feet   2. Venous stasis ulcer of other part of left foot limited to breakdown of skin without varicose veins (HCC)     Meds ordered this encounter  Medications   mupirocin ointment (BACTROBAN) 2 %    Sig: Apply to top of left foot once daily every morning.    Dispense:  44 g    Refill:  1    -Patient's family member present. All questions/concerns addressed on today's visit. -Cleansed left foot wound. Applied TAO. Rx for Mupirocin Ointment to be applied daily. Follow up with Wound Care. Discussed need to elevate feet when at rest and to take diuretics as instructed. -Patient to continue soft, supportive shoe gear daily. -Toenails 1-5 b/l were debrided in length and girth with sterile nail nippers and dremel without iatrogenic bleeding.  -Patient/POA to call should there be question/concern in the interim.   Return in about 3 months (around 09/09/2023).  Freddie Breech, DPM

## 2023-06-17 DIAGNOSIS — I110 Hypertensive heart disease with heart failure: Secondary | ICD-10-CM | POA: Diagnosis not present

## 2023-06-17 DIAGNOSIS — F411 Generalized anxiety disorder: Secondary | ICD-10-CM | POA: Diagnosis not present

## 2023-06-17 DIAGNOSIS — L97811 Non-pressure chronic ulcer of other part of right lower leg limited to breakdown of skin: Secondary | ICD-10-CM | POA: Diagnosis not present

## 2023-06-17 DIAGNOSIS — I5032 Chronic diastolic (congestive) heart failure: Secondary | ICD-10-CM | POA: Diagnosis not present

## 2023-06-17 DIAGNOSIS — Z556 Problems related to health literacy: Secondary | ICD-10-CM | POA: Diagnosis not present

## 2023-06-17 DIAGNOSIS — K2950 Unspecified chronic gastritis without bleeding: Secondary | ICD-10-CM | POA: Diagnosis not present

## 2023-06-17 DIAGNOSIS — Z853 Personal history of malignant neoplasm of breast: Secondary | ICD-10-CM | POA: Diagnosis not present

## 2023-06-17 DIAGNOSIS — M519 Unspecified thoracic, thoracolumbar and lumbosacral intervertebral disc disorder: Secondary | ICD-10-CM | POA: Diagnosis not present

## 2023-06-17 DIAGNOSIS — I739 Peripheral vascular disease, unspecified: Secondary | ICD-10-CM | POA: Diagnosis not present

## 2023-06-17 DIAGNOSIS — M810 Age-related osteoporosis without current pathological fracture: Secondary | ICD-10-CM | POA: Diagnosis not present

## 2023-06-17 DIAGNOSIS — I4510 Unspecified right bundle-branch block: Secondary | ICD-10-CM | POA: Diagnosis not present

## 2023-06-17 DIAGNOSIS — R350 Frequency of micturition: Secondary | ICD-10-CM | POA: Diagnosis not present

## 2023-06-17 DIAGNOSIS — M47812 Spondylosis without myelopathy or radiculopathy, cervical region: Secondary | ICD-10-CM | POA: Diagnosis not present

## 2023-06-17 DIAGNOSIS — K449 Diaphragmatic hernia without obstruction or gangrene: Secondary | ICD-10-CM | POA: Diagnosis not present

## 2023-06-17 DIAGNOSIS — Z602 Problems related to living alone: Secondary | ICD-10-CM | POA: Diagnosis not present

## 2023-06-17 DIAGNOSIS — S90821D Blister (nonthermal), right foot, subsequent encounter: Secondary | ICD-10-CM | POA: Diagnosis not present

## 2023-06-17 DIAGNOSIS — G894 Chronic pain syndrome: Secondary | ICD-10-CM | POA: Diagnosis not present

## 2023-06-17 DIAGNOSIS — I872 Venous insufficiency (chronic) (peripheral): Secondary | ICD-10-CM | POA: Diagnosis not present

## 2023-06-17 DIAGNOSIS — F0394 Unspecified dementia, unspecified severity, with anxiety: Secondary | ICD-10-CM | POA: Diagnosis not present

## 2023-06-22 DIAGNOSIS — F0394 Unspecified dementia, unspecified severity, with anxiety: Secondary | ICD-10-CM | POA: Diagnosis not present

## 2023-06-22 DIAGNOSIS — L97811 Non-pressure chronic ulcer of other part of right lower leg limited to breakdown of skin: Secondary | ICD-10-CM | POA: Diagnosis not present

## 2023-06-22 DIAGNOSIS — I872 Venous insufficiency (chronic) (peripheral): Secondary | ICD-10-CM | POA: Diagnosis not present

## 2023-06-22 DIAGNOSIS — G894 Chronic pain syndrome: Secondary | ICD-10-CM | POA: Diagnosis not present

## 2023-06-22 DIAGNOSIS — I739 Peripheral vascular disease, unspecified: Secondary | ICD-10-CM | POA: Diagnosis not present

## 2023-06-22 DIAGNOSIS — Z556 Problems related to health literacy: Secondary | ICD-10-CM | POA: Diagnosis not present

## 2023-06-22 DIAGNOSIS — F411 Generalized anxiety disorder: Secondary | ICD-10-CM | POA: Diagnosis not present

## 2023-06-22 DIAGNOSIS — I5032 Chronic diastolic (congestive) heart failure: Secondary | ICD-10-CM | POA: Diagnosis not present

## 2023-06-22 DIAGNOSIS — M519 Unspecified thoracic, thoracolumbar and lumbosacral intervertebral disc disorder: Secondary | ICD-10-CM | POA: Diagnosis not present

## 2023-06-22 DIAGNOSIS — M810 Age-related osteoporosis without current pathological fracture: Secondary | ICD-10-CM | POA: Diagnosis not present

## 2023-06-22 DIAGNOSIS — K2950 Unspecified chronic gastritis without bleeding: Secondary | ICD-10-CM | POA: Diagnosis not present

## 2023-06-22 DIAGNOSIS — I4510 Unspecified right bundle-branch block: Secondary | ICD-10-CM | POA: Diagnosis not present

## 2023-06-22 DIAGNOSIS — M47812 Spondylosis without myelopathy or radiculopathy, cervical region: Secondary | ICD-10-CM | POA: Diagnosis not present

## 2023-06-22 DIAGNOSIS — K449 Diaphragmatic hernia without obstruction or gangrene: Secondary | ICD-10-CM | POA: Diagnosis not present

## 2023-06-22 DIAGNOSIS — Z853 Personal history of malignant neoplasm of breast: Secondary | ICD-10-CM | POA: Diagnosis not present

## 2023-06-22 DIAGNOSIS — I110 Hypertensive heart disease with heart failure: Secondary | ICD-10-CM | POA: Diagnosis not present

## 2023-06-26 ENCOUNTER — Encounter: Payer: Self-pay | Admitting: Family Medicine

## 2023-06-27 DIAGNOSIS — M810 Age-related osteoporosis without current pathological fracture: Secondary | ICD-10-CM | POA: Diagnosis not present

## 2023-06-27 DIAGNOSIS — L97811 Non-pressure chronic ulcer of other part of right lower leg limited to breakdown of skin: Secondary | ICD-10-CM | POA: Diagnosis not present

## 2023-06-27 DIAGNOSIS — F411 Generalized anxiety disorder: Secondary | ICD-10-CM | POA: Diagnosis not present

## 2023-06-27 DIAGNOSIS — I4510 Unspecified right bundle-branch block: Secondary | ICD-10-CM | POA: Diagnosis not present

## 2023-06-27 DIAGNOSIS — M47812 Spondylosis without myelopathy or radiculopathy, cervical region: Secondary | ICD-10-CM | POA: Diagnosis not present

## 2023-06-27 DIAGNOSIS — I5032 Chronic diastolic (congestive) heart failure: Secondary | ICD-10-CM | POA: Diagnosis not present

## 2023-06-27 DIAGNOSIS — F0394 Unspecified dementia, unspecified severity, with anxiety: Secondary | ICD-10-CM | POA: Diagnosis not present

## 2023-06-27 DIAGNOSIS — Z853 Personal history of malignant neoplasm of breast: Secondary | ICD-10-CM | POA: Diagnosis not present

## 2023-06-27 DIAGNOSIS — K2950 Unspecified chronic gastritis without bleeding: Secondary | ICD-10-CM | POA: Diagnosis not present

## 2023-06-27 DIAGNOSIS — K449 Diaphragmatic hernia without obstruction or gangrene: Secondary | ICD-10-CM | POA: Diagnosis not present

## 2023-06-27 DIAGNOSIS — I872 Venous insufficiency (chronic) (peripheral): Secondary | ICD-10-CM | POA: Diagnosis not present

## 2023-06-27 DIAGNOSIS — G894 Chronic pain syndrome: Secondary | ICD-10-CM | POA: Diagnosis not present

## 2023-06-27 DIAGNOSIS — M519 Unspecified thoracic, thoracolumbar and lumbosacral intervertebral disc disorder: Secondary | ICD-10-CM | POA: Diagnosis not present

## 2023-06-27 DIAGNOSIS — Z556 Problems related to health literacy: Secondary | ICD-10-CM | POA: Diagnosis not present

## 2023-06-27 DIAGNOSIS — I739 Peripheral vascular disease, unspecified: Secondary | ICD-10-CM | POA: Diagnosis not present

## 2023-06-27 DIAGNOSIS — I110 Hypertensive heart disease with heart failure: Secondary | ICD-10-CM | POA: Diagnosis not present

## 2023-06-29 ENCOUNTER — Encounter: Payer: Self-pay | Admitting: Family Medicine

## 2023-07-01 DIAGNOSIS — Z853 Personal history of malignant neoplasm of breast: Secondary | ICD-10-CM | POA: Diagnosis not present

## 2023-07-01 DIAGNOSIS — I110 Hypertensive heart disease with heart failure: Secondary | ICD-10-CM | POA: Diagnosis not present

## 2023-07-01 DIAGNOSIS — L97811 Non-pressure chronic ulcer of other part of right lower leg limited to breakdown of skin: Secondary | ICD-10-CM | POA: Diagnosis not present

## 2023-07-01 DIAGNOSIS — I4510 Unspecified right bundle-branch block: Secondary | ICD-10-CM | POA: Diagnosis not present

## 2023-07-01 DIAGNOSIS — K2950 Unspecified chronic gastritis without bleeding: Secondary | ICD-10-CM | POA: Diagnosis not present

## 2023-07-01 DIAGNOSIS — M810 Age-related osteoporosis without current pathological fracture: Secondary | ICD-10-CM | POA: Diagnosis not present

## 2023-07-01 DIAGNOSIS — I739 Peripheral vascular disease, unspecified: Secondary | ICD-10-CM | POA: Diagnosis not present

## 2023-07-01 DIAGNOSIS — M519 Unspecified thoracic, thoracolumbar and lumbosacral intervertebral disc disorder: Secondary | ICD-10-CM | POA: Diagnosis not present

## 2023-07-01 DIAGNOSIS — F0394 Unspecified dementia, unspecified severity, with anxiety: Secondary | ICD-10-CM | POA: Diagnosis not present

## 2023-07-01 DIAGNOSIS — I872 Venous insufficiency (chronic) (peripheral): Secondary | ICD-10-CM | POA: Diagnosis not present

## 2023-07-01 DIAGNOSIS — I5032 Chronic diastolic (congestive) heart failure: Secondary | ICD-10-CM | POA: Diagnosis not present

## 2023-07-01 DIAGNOSIS — K449 Diaphragmatic hernia without obstruction or gangrene: Secondary | ICD-10-CM | POA: Diagnosis not present

## 2023-07-01 DIAGNOSIS — F411 Generalized anxiety disorder: Secondary | ICD-10-CM | POA: Diagnosis not present

## 2023-07-01 DIAGNOSIS — Z556 Problems related to health literacy: Secondary | ICD-10-CM | POA: Diagnosis not present

## 2023-07-01 DIAGNOSIS — G894 Chronic pain syndrome: Secondary | ICD-10-CM | POA: Diagnosis not present

## 2023-07-01 DIAGNOSIS — M47812 Spondylosis without myelopathy or radiculopathy, cervical region: Secondary | ICD-10-CM | POA: Diagnosis not present

## 2023-07-02 DIAGNOSIS — K2950 Unspecified chronic gastritis without bleeding: Secondary | ICD-10-CM | POA: Diagnosis not present

## 2023-07-02 DIAGNOSIS — K449 Diaphragmatic hernia without obstruction or gangrene: Secondary | ICD-10-CM | POA: Diagnosis not present

## 2023-07-02 DIAGNOSIS — M519 Unspecified thoracic, thoracolumbar and lumbosacral intervertebral disc disorder: Secondary | ICD-10-CM | POA: Diagnosis not present

## 2023-07-02 DIAGNOSIS — Z556 Problems related to health literacy: Secondary | ICD-10-CM | POA: Diagnosis not present

## 2023-07-02 DIAGNOSIS — M47812 Spondylosis without myelopathy or radiculopathy, cervical region: Secondary | ICD-10-CM | POA: Diagnosis not present

## 2023-07-02 DIAGNOSIS — I4510 Unspecified right bundle-branch block: Secondary | ICD-10-CM | POA: Diagnosis not present

## 2023-07-02 DIAGNOSIS — M810 Age-related osteoporosis without current pathological fracture: Secondary | ICD-10-CM | POA: Diagnosis not present

## 2023-07-02 DIAGNOSIS — G894 Chronic pain syndrome: Secondary | ICD-10-CM | POA: Diagnosis not present

## 2023-07-02 DIAGNOSIS — I5032 Chronic diastolic (congestive) heart failure: Secondary | ICD-10-CM | POA: Diagnosis not present

## 2023-07-02 DIAGNOSIS — F411 Generalized anxiety disorder: Secondary | ICD-10-CM | POA: Diagnosis not present

## 2023-07-02 DIAGNOSIS — F0394 Unspecified dementia, unspecified severity, with anxiety: Secondary | ICD-10-CM | POA: Diagnosis not present

## 2023-07-02 DIAGNOSIS — L97811 Non-pressure chronic ulcer of other part of right lower leg limited to breakdown of skin: Secondary | ICD-10-CM | POA: Diagnosis not present

## 2023-07-02 DIAGNOSIS — Z853 Personal history of malignant neoplasm of breast: Secondary | ICD-10-CM | POA: Diagnosis not present

## 2023-07-02 DIAGNOSIS — I110 Hypertensive heart disease with heart failure: Secondary | ICD-10-CM | POA: Diagnosis not present

## 2023-07-02 DIAGNOSIS — I872 Venous insufficiency (chronic) (peripheral): Secondary | ICD-10-CM | POA: Diagnosis not present

## 2023-07-02 DIAGNOSIS — I739 Peripheral vascular disease, unspecified: Secondary | ICD-10-CM | POA: Diagnosis not present

## 2023-07-03 DIAGNOSIS — F411 Generalized anxiety disorder: Secondary | ICD-10-CM | POA: Diagnosis not present

## 2023-07-03 DIAGNOSIS — K449 Diaphragmatic hernia without obstruction or gangrene: Secondary | ICD-10-CM | POA: Diagnosis not present

## 2023-07-03 DIAGNOSIS — F0394 Unspecified dementia, unspecified severity, with anxiety: Secondary | ICD-10-CM | POA: Diagnosis not present

## 2023-07-03 DIAGNOSIS — M810 Age-related osteoporosis without current pathological fracture: Secondary | ICD-10-CM | POA: Diagnosis not present

## 2023-07-03 DIAGNOSIS — I5032 Chronic diastolic (congestive) heart failure: Secondary | ICD-10-CM | POA: Diagnosis not present

## 2023-07-03 DIAGNOSIS — M519 Unspecified thoracic, thoracolumbar and lumbosacral intervertebral disc disorder: Secondary | ICD-10-CM | POA: Diagnosis not present

## 2023-07-03 DIAGNOSIS — G894 Chronic pain syndrome: Secondary | ICD-10-CM | POA: Diagnosis not present

## 2023-07-03 DIAGNOSIS — K2950 Unspecified chronic gastritis without bleeding: Secondary | ICD-10-CM | POA: Diagnosis not present

## 2023-07-03 DIAGNOSIS — I739 Peripheral vascular disease, unspecified: Secondary | ICD-10-CM | POA: Diagnosis not present

## 2023-07-03 DIAGNOSIS — Z556 Problems related to health literacy: Secondary | ICD-10-CM | POA: Diagnosis not present

## 2023-07-03 DIAGNOSIS — Z853 Personal history of malignant neoplasm of breast: Secondary | ICD-10-CM | POA: Diagnosis not present

## 2023-07-03 DIAGNOSIS — M47812 Spondylosis without myelopathy or radiculopathy, cervical region: Secondary | ICD-10-CM | POA: Diagnosis not present

## 2023-07-03 DIAGNOSIS — L97811 Non-pressure chronic ulcer of other part of right lower leg limited to breakdown of skin: Secondary | ICD-10-CM | POA: Diagnosis not present

## 2023-07-03 DIAGNOSIS — I4510 Unspecified right bundle-branch block: Secondary | ICD-10-CM | POA: Diagnosis not present

## 2023-07-03 DIAGNOSIS — I872 Venous insufficiency (chronic) (peripheral): Secondary | ICD-10-CM | POA: Diagnosis not present

## 2023-07-03 DIAGNOSIS — I110 Hypertensive heart disease with heart failure: Secondary | ICD-10-CM | POA: Diagnosis not present

## 2023-07-06 DIAGNOSIS — I872 Venous insufficiency (chronic) (peripheral): Secondary | ICD-10-CM | POA: Diagnosis not present

## 2023-07-06 DIAGNOSIS — I739 Peripheral vascular disease, unspecified: Secondary | ICD-10-CM | POA: Diagnosis not present

## 2023-07-06 DIAGNOSIS — L97811 Non-pressure chronic ulcer of other part of right lower leg limited to breakdown of skin: Secondary | ICD-10-CM | POA: Diagnosis not present

## 2023-07-06 DIAGNOSIS — I110 Hypertensive heart disease with heart failure: Secondary | ICD-10-CM | POA: Diagnosis not present

## 2023-07-06 DIAGNOSIS — G894 Chronic pain syndrome: Secondary | ICD-10-CM | POA: Diagnosis not present

## 2023-07-06 DIAGNOSIS — K449 Diaphragmatic hernia without obstruction or gangrene: Secondary | ICD-10-CM | POA: Diagnosis not present

## 2023-07-06 DIAGNOSIS — Z556 Problems related to health literacy: Secondary | ICD-10-CM | POA: Diagnosis not present

## 2023-07-06 DIAGNOSIS — M810 Age-related osteoporosis without current pathological fracture: Secondary | ICD-10-CM | POA: Diagnosis not present

## 2023-07-06 DIAGNOSIS — I4510 Unspecified right bundle-branch block: Secondary | ICD-10-CM | POA: Diagnosis not present

## 2023-07-06 DIAGNOSIS — F0394 Unspecified dementia, unspecified severity, with anxiety: Secondary | ICD-10-CM | POA: Diagnosis not present

## 2023-07-06 DIAGNOSIS — I5032 Chronic diastolic (congestive) heart failure: Secondary | ICD-10-CM | POA: Diagnosis not present

## 2023-07-06 DIAGNOSIS — Z853 Personal history of malignant neoplasm of breast: Secondary | ICD-10-CM | POA: Diagnosis not present

## 2023-07-06 DIAGNOSIS — M519 Unspecified thoracic, thoracolumbar and lumbosacral intervertebral disc disorder: Secondary | ICD-10-CM | POA: Diagnosis not present

## 2023-07-06 DIAGNOSIS — K2950 Unspecified chronic gastritis without bleeding: Secondary | ICD-10-CM | POA: Diagnosis not present

## 2023-07-06 DIAGNOSIS — M47812 Spondylosis without myelopathy or radiculopathy, cervical region: Secondary | ICD-10-CM | POA: Diagnosis not present

## 2023-07-06 DIAGNOSIS — F411 Generalized anxiety disorder: Secondary | ICD-10-CM | POA: Diagnosis not present

## 2023-07-07 ENCOUNTER — Telehealth: Payer: Self-pay

## 2023-07-07 DIAGNOSIS — M47812 Spondylosis without myelopathy or radiculopathy, cervical region: Secondary | ICD-10-CM | POA: Diagnosis not present

## 2023-07-07 DIAGNOSIS — K2950 Unspecified chronic gastritis without bleeding: Secondary | ICD-10-CM | POA: Diagnosis not present

## 2023-07-07 DIAGNOSIS — F411 Generalized anxiety disorder: Secondary | ICD-10-CM | POA: Diagnosis not present

## 2023-07-07 DIAGNOSIS — I739 Peripheral vascular disease, unspecified: Secondary | ICD-10-CM | POA: Diagnosis not present

## 2023-07-07 DIAGNOSIS — K449 Diaphragmatic hernia without obstruction or gangrene: Secondary | ICD-10-CM | POA: Diagnosis not present

## 2023-07-07 DIAGNOSIS — I872 Venous insufficiency (chronic) (peripheral): Secondary | ICD-10-CM | POA: Diagnosis not present

## 2023-07-07 DIAGNOSIS — F0394 Unspecified dementia, unspecified severity, with anxiety: Secondary | ICD-10-CM | POA: Diagnosis not present

## 2023-07-07 DIAGNOSIS — I110 Hypertensive heart disease with heart failure: Secondary | ICD-10-CM | POA: Diagnosis not present

## 2023-07-07 DIAGNOSIS — Z853 Personal history of malignant neoplasm of breast: Secondary | ICD-10-CM | POA: Diagnosis not present

## 2023-07-07 DIAGNOSIS — Z556 Problems related to health literacy: Secondary | ICD-10-CM | POA: Diagnosis not present

## 2023-07-07 DIAGNOSIS — I4510 Unspecified right bundle-branch block: Secondary | ICD-10-CM | POA: Diagnosis not present

## 2023-07-07 DIAGNOSIS — L97811 Non-pressure chronic ulcer of other part of right lower leg limited to breakdown of skin: Secondary | ICD-10-CM | POA: Diagnosis not present

## 2023-07-07 DIAGNOSIS — M519 Unspecified thoracic, thoracolumbar and lumbosacral intervertebral disc disorder: Secondary | ICD-10-CM | POA: Diagnosis not present

## 2023-07-07 DIAGNOSIS — G894 Chronic pain syndrome: Secondary | ICD-10-CM | POA: Diagnosis not present

## 2023-07-07 DIAGNOSIS — M810 Age-related osteoporosis without current pathological fracture: Secondary | ICD-10-CM | POA: Diagnosis not present

## 2023-07-07 DIAGNOSIS — I5032 Chronic diastolic (congestive) heart failure: Secondary | ICD-10-CM | POA: Diagnosis not present

## 2023-07-07 NOTE — Telephone Encounter (Signed)
Okay to give verbal orders?  ?

## 2023-07-07 NOTE — Telephone Encounter (Signed)
Heather [name] with [facility] called stating that she doesn't know if patient rubbed her left foot on her wheelchair,but patient has wound on left foot that is weeping. She would like verbal orders to cleanse wound with wound cleanser, apply triamcinolone cream, cover with alginate and apply compression socks two times a week.  Message sent to Richarda Blade, NP

## 2023-07-07 NOTE — Telephone Encounter (Signed)
Patient needs triamcinolone cream sent to pharmcy.

## 2023-07-08 MED ORDER — TRIAMCINOLONE ACETONIDE 0.1 % EX CREA: 1.0000 | TOPICAL_CREAM | Freq: Two times a day (BID) | CUTANEOUS | 0 refills | Status: AC

## 2023-07-08 NOTE — Telephone Encounter (Signed)
Triamcinolone cream send to pharmacy.

## 2023-07-10 DIAGNOSIS — M47812 Spondylosis without myelopathy or radiculopathy, cervical region: Secondary | ICD-10-CM | POA: Diagnosis not present

## 2023-07-10 DIAGNOSIS — K449 Diaphragmatic hernia without obstruction or gangrene: Secondary | ICD-10-CM | POA: Diagnosis not present

## 2023-07-10 DIAGNOSIS — I4510 Unspecified right bundle-branch block: Secondary | ICD-10-CM | POA: Diagnosis not present

## 2023-07-10 DIAGNOSIS — I739 Peripheral vascular disease, unspecified: Secondary | ICD-10-CM | POA: Diagnosis not present

## 2023-07-10 DIAGNOSIS — Z556 Problems related to health literacy: Secondary | ICD-10-CM | POA: Diagnosis not present

## 2023-07-10 DIAGNOSIS — I110 Hypertensive heart disease with heart failure: Secondary | ICD-10-CM | POA: Diagnosis not present

## 2023-07-10 DIAGNOSIS — I5032 Chronic diastolic (congestive) heart failure: Secondary | ICD-10-CM | POA: Diagnosis not present

## 2023-07-10 DIAGNOSIS — G894 Chronic pain syndrome: Secondary | ICD-10-CM | POA: Diagnosis not present

## 2023-07-10 DIAGNOSIS — M519 Unspecified thoracic, thoracolumbar and lumbosacral intervertebral disc disorder: Secondary | ICD-10-CM | POA: Diagnosis not present

## 2023-07-10 DIAGNOSIS — L97811 Non-pressure chronic ulcer of other part of right lower leg limited to breakdown of skin: Secondary | ICD-10-CM | POA: Diagnosis not present

## 2023-07-10 DIAGNOSIS — K2950 Unspecified chronic gastritis without bleeding: Secondary | ICD-10-CM | POA: Diagnosis not present

## 2023-07-10 DIAGNOSIS — Z853 Personal history of malignant neoplasm of breast: Secondary | ICD-10-CM | POA: Diagnosis not present

## 2023-07-10 DIAGNOSIS — F0394 Unspecified dementia, unspecified severity, with anxiety: Secondary | ICD-10-CM | POA: Diagnosis not present

## 2023-07-10 DIAGNOSIS — I872 Venous insufficiency (chronic) (peripheral): Secondary | ICD-10-CM | POA: Diagnosis not present

## 2023-07-10 DIAGNOSIS — F411 Generalized anxiety disorder: Secondary | ICD-10-CM | POA: Diagnosis not present

## 2023-07-10 DIAGNOSIS — M810 Age-related osteoporosis without current pathological fracture: Secondary | ICD-10-CM | POA: Diagnosis not present

## 2023-07-13 DIAGNOSIS — F0394 Unspecified dementia, unspecified severity, with anxiety: Secondary | ICD-10-CM | POA: Diagnosis not present

## 2023-07-13 DIAGNOSIS — M47812 Spondylosis without myelopathy or radiculopathy, cervical region: Secondary | ICD-10-CM | POA: Diagnosis not present

## 2023-07-13 DIAGNOSIS — Z853 Personal history of malignant neoplasm of breast: Secondary | ICD-10-CM | POA: Diagnosis not present

## 2023-07-13 DIAGNOSIS — I110 Hypertensive heart disease with heart failure: Secondary | ICD-10-CM | POA: Diagnosis not present

## 2023-07-13 DIAGNOSIS — M519 Unspecified thoracic, thoracolumbar and lumbosacral intervertebral disc disorder: Secondary | ICD-10-CM | POA: Diagnosis not present

## 2023-07-13 DIAGNOSIS — I5032 Chronic diastolic (congestive) heart failure: Secondary | ICD-10-CM | POA: Diagnosis not present

## 2023-07-13 DIAGNOSIS — I4510 Unspecified right bundle-branch block: Secondary | ICD-10-CM | POA: Diagnosis not present

## 2023-07-13 DIAGNOSIS — M810 Age-related osteoporosis without current pathological fracture: Secondary | ICD-10-CM | POA: Diagnosis not present

## 2023-07-13 DIAGNOSIS — K449 Diaphragmatic hernia without obstruction or gangrene: Secondary | ICD-10-CM | POA: Diagnosis not present

## 2023-07-13 DIAGNOSIS — K2950 Unspecified chronic gastritis without bleeding: Secondary | ICD-10-CM | POA: Diagnosis not present

## 2023-07-13 DIAGNOSIS — L97811 Non-pressure chronic ulcer of other part of right lower leg limited to breakdown of skin: Secondary | ICD-10-CM | POA: Diagnosis not present

## 2023-07-13 DIAGNOSIS — I872 Venous insufficiency (chronic) (peripheral): Secondary | ICD-10-CM | POA: Diagnosis not present

## 2023-07-13 DIAGNOSIS — I739 Peripheral vascular disease, unspecified: Secondary | ICD-10-CM | POA: Diagnosis not present

## 2023-07-13 DIAGNOSIS — Z556 Problems related to health literacy: Secondary | ICD-10-CM | POA: Diagnosis not present

## 2023-07-13 DIAGNOSIS — G894 Chronic pain syndrome: Secondary | ICD-10-CM | POA: Diagnosis not present

## 2023-07-13 DIAGNOSIS — F411 Generalized anxiety disorder: Secondary | ICD-10-CM | POA: Diagnosis not present

## 2023-07-14 DIAGNOSIS — K2950 Unspecified chronic gastritis without bleeding: Secondary | ICD-10-CM | POA: Diagnosis not present

## 2023-07-14 DIAGNOSIS — F0394 Unspecified dementia, unspecified severity, with anxiety: Secondary | ICD-10-CM | POA: Diagnosis not present

## 2023-07-14 DIAGNOSIS — Z556 Problems related to health literacy: Secondary | ICD-10-CM | POA: Diagnosis not present

## 2023-07-14 DIAGNOSIS — I110 Hypertensive heart disease with heart failure: Secondary | ICD-10-CM | POA: Diagnosis not present

## 2023-07-14 DIAGNOSIS — M47812 Spondylosis without myelopathy or radiculopathy, cervical region: Secondary | ICD-10-CM | POA: Diagnosis not present

## 2023-07-14 DIAGNOSIS — L97811 Non-pressure chronic ulcer of other part of right lower leg limited to breakdown of skin: Secondary | ICD-10-CM | POA: Diagnosis not present

## 2023-07-14 DIAGNOSIS — F411 Generalized anxiety disorder: Secondary | ICD-10-CM | POA: Diagnosis not present

## 2023-07-14 DIAGNOSIS — I739 Peripheral vascular disease, unspecified: Secondary | ICD-10-CM | POA: Diagnosis not present

## 2023-07-14 DIAGNOSIS — Z853 Personal history of malignant neoplasm of breast: Secondary | ICD-10-CM | POA: Diagnosis not present

## 2023-07-14 DIAGNOSIS — I872 Venous insufficiency (chronic) (peripheral): Secondary | ICD-10-CM | POA: Diagnosis not present

## 2023-07-14 DIAGNOSIS — G894 Chronic pain syndrome: Secondary | ICD-10-CM | POA: Diagnosis not present

## 2023-07-14 DIAGNOSIS — K449 Diaphragmatic hernia without obstruction or gangrene: Secondary | ICD-10-CM | POA: Diagnosis not present

## 2023-07-14 DIAGNOSIS — M810 Age-related osteoporosis without current pathological fracture: Secondary | ICD-10-CM | POA: Diagnosis not present

## 2023-07-14 DIAGNOSIS — I4510 Unspecified right bundle-branch block: Secondary | ICD-10-CM | POA: Diagnosis not present

## 2023-07-14 DIAGNOSIS — M519 Unspecified thoracic, thoracolumbar and lumbosacral intervertebral disc disorder: Secondary | ICD-10-CM | POA: Diagnosis not present

## 2023-07-14 DIAGNOSIS — I5032 Chronic diastolic (congestive) heart failure: Secondary | ICD-10-CM | POA: Diagnosis not present

## 2023-07-17 DIAGNOSIS — K449 Diaphragmatic hernia without obstruction or gangrene: Secondary | ICD-10-CM | POA: Diagnosis not present

## 2023-07-17 DIAGNOSIS — M810 Age-related osteoporosis without current pathological fracture: Secondary | ICD-10-CM | POA: Diagnosis not present

## 2023-07-17 DIAGNOSIS — S90821D Blister (nonthermal), right foot, subsequent encounter: Secondary | ICD-10-CM | POA: Diagnosis not present

## 2023-07-17 DIAGNOSIS — Z556 Problems related to health literacy: Secondary | ICD-10-CM | POA: Diagnosis not present

## 2023-07-17 DIAGNOSIS — G894 Chronic pain syndrome: Secondary | ICD-10-CM | POA: Diagnosis not present

## 2023-07-17 DIAGNOSIS — R350 Frequency of micturition: Secondary | ICD-10-CM | POA: Diagnosis not present

## 2023-07-17 DIAGNOSIS — Z602 Problems related to living alone: Secondary | ICD-10-CM | POA: Diagnosis not present

## 2023-07-17 DIAGNOSIS — I739 Peripheral vascular disease, unspecified: Secondary | ICD-10-CM | POA: Diagnosis not present

## 2023-07-17 DIAGNOSIS — I5032 Chronic diastolic (congestive) heart failure: Secondary | ICD-10-CM | POA: Diagnosis not present

## 2023-07-17 DIAGNOSIS — L97811 Non-pressure chronic ulcer of other part of right lower leg limited to breakdown of skin: Secondary | ICD-10-CM | POA: Diagnosis not present

## 2023-07-17 DIAGNOSIS — Z853 Personal history of malignant neoplasm of breast: Secondary | ICD-10-CM | POA: Diagnosis not present

## 2023-07-17 DIAGNOSIS — M519 Unspecified thoracic, thoracolumbar and lumbosacral intervertebral disc disorder: Secondary | ICD-10-CM | POA: Diagnosis not present

## 2023-07-17 DIAGNOSIS — I872 Venous insufficiency (chronic) (peripheral): Secondary | ICD-10-CM | POA: Diagnosis not present

## 2023-07-17 DIAGNOSIS — I4510 Unspecified right bundle-branch block: Secondary | ICD-10-CM | POA: Diagnosis not present

## 2023-07-17 DIAGNOSIS — M47812 Spondylosis without myelopathy or radiculopathy, cervical region: Secondary | ICD-10-CM | POA: Diagnosis not present

## 2023-07-17 DIAGNOSIS — K2950 Unspecified chronic gastritis without bleeding: Secondary | ICD-10-CM | POA: Diagnosis not present

## 2023-07-17 DIAGNOSIS — F0394 Unspecified dementia, unspecified severity, with anxiety: Secondary | ICD-10-CM | POA: Diagnosis not present

## 2023-07-17 DIAGNOSIS — F411 Generalized anxiety disorder: Secondary | ICD-10-CM | POA: Diagnosis not present

## 2023-07-17 DIAGNOSIS — I110 Hypertensive heart disease with heart failure: Secondary | ICD-10-CM | POA: Diagnosis not present

## 2023-07-22 DIAGNOSIS — G894 Chronic pain syndrome: Secondary | ICD-10-CM | POA: Diagnosis not present

## 2023-07-22 DIAGNOSIS — I4510 Unspecified right bundle-branch block: Secondary | ICD-10-CM | POA: Diagnosis not present

## 2023-07-22 DIAGNOSIS — M810 Age-related osteoporosis without current pathological fracture: Secondary | ICD-10-CM | POA: Diagnosis not present

## 2023-07-22 DIAGNOSIS — F411 Generalized anxiety disorder: Secondary | ICD-10-CM | POA: Diagnosis not present

## 2023-07-22 DIAGNOSIS — F0394 Unspecified dementia, unspecified severity, with anxiety: Secondary | ICD-10-CM | POA: Diagnosis not present

## 2023-07-22 DIAGNOSIS — I110 Hypertensive heart disease with heart failure: Secondary | ICD-10-CM | POA: Diagnosis not present

## 2023-07-22 DIAGNOSIS — M47812 Spondylosis without myelopathy or radiculopathy, cervical region: Secondary | ICD-10-CM | POA: Diagnosis not present

## 2023-07-22 DIAGNOSIS — K449 Diaphragmatic hernia without obstruction or gangrene: Secondary | ICD-10-CM | POA: Diagnosis not present

## 2023-07-22 DIAGNOSIS — I872 Venous insufficiency (chronic) (peripheral): Secondary | ICD-10-CM | POA: Diagnosis not present

## 2023-07-22 DIAGNOSIS — I739 Peripheral vascular disease, unspecified: Secondary | ICD-10-CM | POA: Diagnosis not present

## 2023-07-22 DIAGNOSIS — Z556 Problems related to health literacy: Secondary | ICD-10-CM | POA: Diagnosis not present

## 2023-07-22 DIAGNOSIS — I5032 Chronic diastolic (congestive) heart failure: Secondary | ICD-10-CM | POA: Diagnosis not present

## 2023-07-22 DIAGNOSIS — K2950 Unspecified chronic gastritis without bleeding: Secondary | ICD-10-CM | POA: Diagnosis not present

## 2023-07-22 DIAGNOSIS — L97811 Non-pressure chronic ulcer of other part of right lower leg limited to breakdown of skin: Secondary | ICD-10-CM | POA: Diagnosis not present

## 2023-07-22 DIAGNOSIS — Z853 Personal history of malignant neoplasm of breast: Secondary | ICD-10-CM | POA: Diagnosis not present

## 2023-07-22 DIAGNOSIS — M519 Unspecified thoracic, thoracolumbar and lumbosacral intervertebral disc disorder: Secondary | ICD-10-CM | POA: Diagnosis not present

## 2023-07-23 ENCOUNTER — Other Ambulatory Visit: Payer: Self-pay | Admitting: Family

## 2023-07-23 DIAGNOSIS — K219 Gastro-esophageal reflux disease without esophagitis: Secondary | ICD-10-CM

## 2023-07-23 NOTE — Telephone Encounter (Signed)
Patient medication Pepcid has warnings. Medication pend and sent to PCP Ngetich, Donalee Citrin, NP for approval

## 2023-07-24 ENCOUNTER — Encounter: Payer: Self-pay | Admitting: Family

## 2023-07-24 ENCOUNTER — Ambulatory Visit (INDEPENDENT_AMBULATORY_CARE_PROVIDER_SITE_OTHER): Payer: Medicare Other | Admitting: Family

## 2023-07-24 ENCOUNTER — Ambulatory Visit
Admission: RE | Admit: 2023-07-24 | Discharge: 2023-07-24 | Disposition: A | Discharge status: Home / Self Care | Payer: Medicare Other | Source: Ambulatory Visit | Attending: Family | Admitting: Family

## 2023-07-24 VITALS — BP 100/60 | HR 79 | Temp 97.6°F | Resp 12 | Ht <= 58 in | Wt 110.0 lb

## 2023-07-24 DIAGNOSIS — K219 Gastro-esophageal reflux disease without esophagitis: Secondary | ICD-10-CM

## 2023-07-24 DIAGNOSIS — M79641 Pain in right hand: Secondary | ICD-10-CM

## 2023-07-24 DIAGNOSIS — M79621 Pain in right upper arm: Secondary | ICD-10-CM | POA: Diagnosis not present

## 2023-07-24 DIAGNOSIS — K449 Diaphragmatic hernia without obstruction or gangrene: Secondary | ICD-10-CM | POA: Diagnosis not present

## 2023-07-24 DIAGNOSIS — M79631 Pain in right forearm: Secondary | ICD-10-CM | POA: Diagnosis not present

## 2023-07-24 MED ORDER — FAMOTIDINE 20 MG PO TABS: 20.0000 mg | ORAL_TABLET | Freq: Every day | ORAL | 1 refills | Status: AC

## 2023-07-24 MED ORDER — SPIRONOLACTONE 25 MG PO TABS: 25.0000 mg | ORAL_TABLET | Freq: Every day | ORAL | 1 refills | Status: DC

## 2023-07-24 NOTE — Patient Instructions (Addendum)
Take Tylenol 500 mg tablet one by mouth every 8 hrs as needed for pain  - Please get right X-ray at [facility] imaging at [facility] then will call you with results.

## 2023-07-24 NOTE — Progress Notes (Signed)
Provider: Richarda Blade FNP-C  Astria Jordahl, Donalee Citrin, NP  Patient Care Team: Anthony Tamburo, Donalee Citrin, NP as PCP - General (Family Medicine) Wendall Stade, MD as PCP - Cardiology (Cardiology) Gibson Ramp, MD as Referring Physician (Internal Medicine) Valeria Batman, MD (Inactive) as Attending Physician (Orthopedic Surgery) Olivia Canter, MD as Consulting Physician (Ophthalmology) Michele Mcalpine, MD as Consulting Physician (Pulmonary Disease)  Extended Emergency Contact Information Primary Emergency Contact: Chanda,Michael Address: [address]          [place], [place] [address] [place] Home Phone: [phone] Mobile Phone: [phone] Relation: Son Secondary Emergency Contact: [facility] Phone: [phone] Relation: Other Preferred language: English Interpreter needed? No  Code Status:  Full Code  Goals of care: Advanced Directive information    07/24/2023    2:03 PM  Advanced Directives  Does Patient Have a Medical Advance Directive? No     Chief Complaint  Patient presents with   Acute Visit    Pain in hands, minimal movement, swelling    HPI:  Pt is a 89 y.o. female seen today for an acute visit for evaluation of pain,swelling on the hands x 3 days though woke up  this morning unable to use or stretch fingers on the right hand.she denies any numbness,tingling,speech impairment.Also denies any injuries.Here with son who provides additional HPI information.states patient's hand had some swelling but has slightly improved.   Past Medical History:  Diagnosis Date   Abdominal pain, left lower quadrant 06/22/2013   Abnormal CT scan of lung 07/16/2015   Allergic rhinitis, cause unspecified 02/21/2014   Anxiety state, unspecified    Breast cancer (HCC)    NO STICK OR BLOOD PRESSURE CHECKS IN LEFT ARM   Carpal tunnel syndrome    Carpal tunnel syndrome on both sides 10/03/2008   Qualifier: Diagnosis of  By: Kriste Basque MD, Lonzo Cloud    Cervical  spondylosis    Chronic pain syndrome 03/03/2019   Colon polyp 12/19/2009   Transverse-polypoid colorectal mucosa   Constipation    Cough 10/21/2014   Degenerative joint disease    Diastolic dysfunction    Diverticula of colon    Diverticulosis of colon 05/24/2002   Qualifier: Diagnosis of  By: Thereasa Solo     Gastritis, chronic    Hiatal hernia    Hypertension    Low back pain    LPRD (laryngopharyngeal reflux disease) 04/04/2014   Osteoporosis 05/22/2020   RBBB (right bundle branch block with left anterior fascicular block)    Renal cyst    Venous insufficiency    Past Surgical History:  Procedure Laterality Date   CARPAL TUNNEL RELEASE     INTRAMEDULLARY (IM) NAIL INTERTROCHANTERIC Right 11/13/2021   Procedure: INTRAMEDULLARY (IM) NAIL INTERTROCHANTRIC;  Surgeon: Huel Cote, MD;  Location: [facility];  Service: Orthopedics;  Laterality: Right;   Left mastectomy      Allergies  Allergen Reactions   Fentanyl Nausea Only and Other (See Comments)    Pt states that she is not allergic     Naprosyn [Naproxen] Other (See Comments)    Patient felt tongue and face swelling. Went to ED where no visible swelling noted    Outpatient Encounter Medications as of 07/24/2023  Medication Sig   acetaminophen (TYLENOL) 500 MG tablet Take 500 mg by mouth every 6 (six) hours as needed for moderate pain or headache.   carvedilol (COREG) 3.125 MG tablet Take 1 tablet (3.125 mg total) by mouth 2 (two) times daily.  Hold for SBP <110 or HR <70   cholecalciferol (VITAMIN D) 1000 units tablet Take 1,000 Units by mouth daily.   famotidine (PEPCID) 20 MG tablet Take 1 tablet by mouth once daily   feeding supplement, ENSURE ENLIVE, (ENSURE ENLIVE) LIQD Take 237 mLs by mouth 2 (two) times daily between meals.   furosemide (LASIX) 20 MG tablet Take 0.5 tablets (10 mg total) by mouth daily.   lidocaine (LIDODERM) 5 % Place 1 patch onto the skin as needed. Remove & Discard patch within 12 hours or as  directed by MD   LORazepam (ATIVAN) 0.5 MG tablet Take 0.5 mg by mouth as needed.   Multiple Vitamins-Minerals (MULTIVITAMINS THER. W/MINERALS) TABS Take 1 tablet by mouth daily.   mupirocin ointment (BACTROBAN) 2 % Apply to top of left foot once daily every morning.   polyethylene glycol (MIRALAX / GLYCOLAX) 17 g packet Take 17 g by mouth daily.   Propylene Glycol (SYSTANE COMPLETE OP) Apply 1 drop to eye daily as needed (dry eyes).   sennosides-docusate sodium (SENOKOT-S) 8.6-50 MG tablet Take 1-2 tablets by mouth daily as needed for constipation.   spironolactone (ALDACTONE) 25 MG tablet Take 1 tablet by mouth once daily   triamcinolone cream (KENALOG) 0.1 % Apply 1 Application topically 2 (two) times daily.   No facility-administered encounter medications on file as of 07/24/2023.    Review of Systems  Constitutional:  Negative for appetite change, chills, fatigue and fever.  HENT:  Negative for ear discharge, ear pain, facial swelling, hearing loss, nosebleeds, postnasal drip, rhinorrhea, sinus pressure, sinus pain, sneezing, sore throat, tinnitus and trouble swallowing.   Eyes:  Negative for pain, discharge, redness, itching and visual disturbance.  Respiratory:  Negative for cough, chest tightness, shortness of breath and wheezing.   Cardiovascular:  Negative for chest pain, palpitations and leg swelling.  Musculoskeletal:  Positive for arthralgias and gait problem. Negative for back pain, joint swelling and myalgias.       Right hand swelling and weakness   Skin:  Negative for color change, pallor and rash.  Neurological:  Negative for dizziness, speech difficulty, weakness, light-headedness, numbness and headaches.    Immunization History  Administered Date(s) Administered   Fluad Quad(high Dose 65+) 08/19/2021   Influenza, High Dose Seasonal PF 08/21/2015   Influenza,inj,Quad PF,6+ Mos 09/18/2015   PFIZER(Purple Top)SARS-COV-2 Vaccination 04/18/2020, 05/10/2020   Pneumococcal  Polysaccharide-23 02/07/2015   Pertinent  Health Maintenance Due  Topic Date Due   INFLUENZA VACCINE  06/18/2023   DEXA SCAN  Completed      05/29/2022    3:07 PM 07/08/2022    2:05 PM 08/21/2022    1:56 PM 12/05/2022    3:50 PM 07/24/2023    2:03 PM  Fall Risk  Falls in the past year? 0 1 0 1 0  Was there an injury with Fall? 0 0 0 1   Fall Risk Category Calculator 0 1 0 3   Fall Risk Category (Retired) Low Low Low    (RETIRED) Patient Fall Risk Level Low fall risk Low fall risk Low fall risk    Patient at Risk for Falls Due to No Fall Risks History of fall(s);Impaired balance/gait;Impaired mobility No Fall Risks History of fall(s);Impaired balance/gait;Impaired mobility   Fall risk Follow up Falls evaluation completed Falls evaluation completed;Education provided;Falls prevention discussed Falls evaluation completed Falls evaluation completed;Education provided;Falls prevention discussed    Functional Status Survey:    Vitals:   07/24/23 1405  BP: 100/60  Pulse:  79  Resp: 12  Temp: 97.6 F (36.4 C)  SpO2: 98%  Weight: 110 lb (49.9 kg)  Height: 4\' 10"  (1.473 m)   Body mass index is 22.99 kg/m. Physical Exam Vitals reviewed.  Constitutional:      General: She is not in acute distress.    Appearance: Normal appearance. She is normal weight. She is not ill-appearing or diaphoretic.  HENT:     Head: Normocephalic.     Mouth/Throat:     Mouth: Mucous membranes are moist.     Pharynx: Oropharynx is clear. No oropharyngeal exudate or posterior oropharyngeal erythema.  Eyes:     General: No scleral icterus.       Right eye: No discharge.        Left eye: No discharge.     Extraocular Movements: Extraocular movements intact.     Conjunctiva/sclera: Conjunctivae normal.     Pupils: Pupils are equal, round, and reactive to light.  Neck:     Vascular: No carotid bruit.  Cardiovascular:     Rate and Rhythm: Normal rate and regular rhythm.     Pulses: Normal pulses.      Heart sounds: Normal heart sounds. No murmur heard.    No friction rub. No gallop.  Pulmonary:     Effort: Pulmonary effort is normal. No respiratory distress.     Breath sounds: Normal breath sounds. No wheezing, rhonchi or rales.  Chest:     Chest wall: No tenderness.  Abdominal:     General: Bowel sounds are normal. There is no distension.     Palpations: Abdomen is soft. There is no mass.     Tenderness: There is no abdominal tenderness. There is no right CVA tenderness, left CVA tenderness, guarding or rebound.  Musculoskeletal:        General: No swelling or tenderness.     Right hand: No tenderness. Decreased range of motion. Decreased strength. Normal sensation. Normal capillary refill. Normal pulse.     Left hand: Normal.     Cervical back: Normal range of motion. No rigidity or tenderness.     Right lower leg: No edema.     Left lower leg: No edema.  Lymphadenopathy:     Cervical: No cervical adenopathy.  Skin:    General: Skin is warm and dry.     Coloration: Skin is not pale.     Findings: No bruising, erythema, lesion or rash.  Neurological:     Mental Status: She is alert. Mental status is at baseline.     Cranial Nerves: No cranial nerve deficit.     Sensory: No sensory deficit.     Coordination: Coordination normal.     Gait: Gait abnormal.  Psychiatric:        Mood and Affect: Mood normal.        Speech: Speech normal.        Behavior: Behavior normal.     Labs reviewed: Recent Labs    06/08/23 1133  NA 142  K 4.7  CL 105  CO2 31  GLUCOSE 88  BUN 26*  CREATININE 0.98*  CALCIUM 9.5   Recent Labs    06/08/23 1133  AST 23  ALT 18  BILITOT 0.5  PROT 6.3   Recent Labs    06/08/23 1133  WBC 5.5  NEUTROABS 3,465  HGB 12.5  HCT 38.6  MCV 93.5  PLT 239   Lab Results  Component Value Date   TSH 2.55 06/08/2023  No results found for: "HGBA1C" Lab Results  Component Value Date   CHOL 174 06/08/2023   HDL 73 06/08/2023   LDLCALC 89  06/08/2023   TRIG 42 06/08/2023   CHOLHDL 2.4 06/08/2023    Significant Diagnostic Results in last 30 days:  No results found.  Assessment/Plan  1. Right hand pain Decreased ROM and strength.unable to straighten fingers.capillary refill intact and strong radial pulse.  - will obtain imaging to rule out acute abnormalities  - DG Hand Complete Right; Future - DG Humerus Right; Future - DG Forearm Right; Future  2. Hiatal hernia with GERD without esophagitis Start on Famotidine - famotidine (PEPCID) 20 MG tablet; Take 1 tablet (20 mg total) by mouth daily.  Dispense: 90 tablet; Refill: 1  Family/ staff Communication: Reviewed plan of care with patient verbalized understanding   Labs/tests ordered:  - DG Hand Complete Right; Future - DG Humerus Right; Future - DG Forearm Right; Future  Next Appointment: Return if symptoms worsen or fail to improve.   Caesar Bookman, NP

## 2023-07-28 ENCOUNTER — Telehealth: Payer: Medicare Other

## 2023-07-28 DIAGNOSIS — Z853 Personal history of malignant neoplasm of breast: Secondary | ICD-10-CM | POA: Diagnosis not present

## 2023-07-28 DIAGNOSIS — M519 Unspecified thoracic, thoracolumbar and lumbosacral intervertebral disc disorder: Secondary | ICD-10-CM | POA: Diagnosis not present

## 2023-07-28 DIAGNOSIS — I5032 Chronic diastolic (congestive) heart failure: Secondary | ICD-10-CM | POA: Diagnosis not present

## 2023-07-28 DIAGNOSIS — F411 Generalized anxiety disorder: Secondary | ICD-10-CM | POA: Diagnosis not present

## 2023-07-28 DIAGNOSIS — Z556 Problems related to health literacy: Secondary | ICD-10-CM | POA: Diagnosis not present

## 2023-07-28 DIAGNOSIS — G894 Chronic pain syndrome: Secondary | ICD-10-CM | POA: Diagnosis not present

## 2023-07-28 DIAGNOSIS — I739 Peripheral vascular disease, unspecified: Secondary | ICD-10-CM | POA: Diagnosis not present

## 2023-07-28 DIAGNOSIS — M47812 Spondylosis without myelopathy or radiculopathy, cervical region: Secondary | ICD-10-CM | POA: Diagnosis not present

## 2023-07-28 DIAGNOSIS — F0394 Unspecified dementia, unspecified severity, with anxiety: Secondary | ICD-10-CM | POA: Diagnosis not present

## 2023-07-28 DIAGNOSIS — M79641 Pain in right hand: Secondary | ICD-10-CM

## 2023-07-28 DIAGNOSIS — I4510 Unspecified right bundle-branch block: Secondary | ICD-10-CM | POA: Diagnosis not present

## 2023-07-28 DIAGNOSIS — I110 Hypertensive heart disease with heart failure: Secondary | ICD-10-CM | POA: Diagnosis not present

## 2023-07-28 DIAGNOSIS — L97811 Non-pressure chronic ulcer of other part of right lower leg limited to breakdown of skin: Secondary | ICD-10-CM | POA: Diagnosis not present

## 2023-07-28 DIAGNOSIS — K2950 Unspecified chronic gastritis without bleeding: Secondary | ICD-10-CM | POA: Diagnosis not present

## 2023-07-28 DIAGNOSIS — K449 Diaphragmatic hernia without obstruction or gangrene: Secondary | ICD-10-CM | POA: Diagnosis not present

## 2023-07-28 DIAGNOSIS — M810 Age-related osteoporosis without current pathological fracture: Secondary | ICD-10-CM | POA: Diagnosis not present

## 2023-07-28 DIAGNOSIS — I872 Venous insufficiency (chronic) (peripheral): Secondary | ICD-10-CM | POA: Diagnosis not present

## 2023-07-28 NOTE — Telephone Encounter (Signed)
Has she been taking Tylenol at least twice daily?

## 2023-07-28 NOTE — Telephone Encounter (Signed)
Patients son called to question if xray results were back, in which I informed him that hey are not and he asked what is the plan to treat the had pain while we wait for results.  Please advise

## 2023-07-29 DIAGNOSIS — Z853 Personal history of malignant neoplasm of breast: Secondary | ICD-10-CM | POA: Diagnosis not present

## 2023-07-29 DIAGNOSIS — L97811 Non-pressure chronic ulcer of other part of right lower leg limited to breakdown of skin: Secondary | ICD-10-CM | POA: Diagnosis not present

## 2023-07-29 DIAGNOSIS — K449 Diaphragmatic hernia without obstruction or gangrene: Secondary | ICD-10-CM | POA: Diagnosis not present

## 2023-07-29 DIAGNOSIS — I872 Venous insufficiency (chronic) (peripheral): Secondary | ICD-10-CM | POA: Diagnosis not present

## 2023-07-29 DIAGNOSIS — K2950 Unspecified chronic gastritis without bleeding: Secondary | ICD-10-CM | POA: Diagnosis not present

## 2023-07-29 DIAGNOSIS — M519 Unspecified thoracic, thoracolumbar and lumbosacral intervertebral disc disorder: Secondary | ICD-10-CM | POA: Diagnosis not present

## 2023-07-29 DIAGNOSIS — M47812 Spondylosis without myelopathy or radiculopathy, cervical region: Secondary | ICD-10-CM | POA: Diagnosis not present

## 2023-07-29 DIAGNOSIS — I4510 Unspecified right bundle-branch block: Secondary | ICD-10-CM | POA: Diagnosis not present

## 2023-07-29 DIAGNOSIS — I5032 Chronic diastolic (congestive) heart failure: Secondary | ICD-10-CM | POA: Diagnosis not present

## 2023-07-29 DIAGNOSIS — F411 Generalized anxiety disorder: Secondary | ICD-10-CM | POA: Diagnosis not present

## 2023-07-29 DIAGNOSIS — M810 Age-related osteoporosis without current pathological fracture: Secondary | ICD-10-CM | POA: Diagnosis not present

## 2023-07-29 DIAGNOSIS — Z556 Problems related to health literacy: Secondary | ICD-10-CM | POA: Diagnosis not present

## 2023-07-29 DIAGNOSIS — I110 Hypertensive heart disease with heart failure: Secondary | ICD-10-CM | POA: Diagnosis not present

## 2023-07-29 DIAGNOSIS — G894 Chronic pain syndrome: Secondary | ICD-10-CM | POA: Diagnosis not present

## 2023-07-29 DIAGNOSIS — F0394 Unspecified dementia, unspecified severity, with anxiety: Secondary | ICD-10-CM | POA: Diagnosis not present

## 2023-07-29 DIAGNOSIS — I739 Peripheral vascular disease, unspecified: Secondary | ICD-10-CM | POA: Diagnosis not present

## 2023-07-29 NOTE — Telephone Encounter (Signed)
Will order urgent refer to orthopedic for evaluation of hand pain.

## 2023-07-29 NOTE — Telephone Encounter (Signed)
Spoke with patients son, hand pain is still present unchanged. Patient  takes tylenol off and on, when she will comply with son directives. Casimiro Needle informed that as of now we still do not have results

## 2023-07-30 NOTE — Telephone Encounter (Signed)
Patients son is aware and stated Ortho called them about a hour ago and patient will be seen tomorrow with Dr.Ashe

## 2023-07-31 ENCOUNTER — Ambulatory Visit: Payer: Medicare Other | Admitting: Orthopedic Surgery

## 2023-07-31 DIAGNOSIS — M19041 Primary osteoarthritis, right hand: Secondary | ICD-10-CM

## 2023-07-31 NOTE — Progress Notes (Signed)
Heather [name] - 89 y.o. female MRN [id number]  Date of birth: 1918/09/15  Office Visit Note: Visit Date: 07/31/2023 PCP: Caesar Bookman, NP Referred by: Caesar Bookman, NP  Subjective: No chief complaint on file.  HPI: Heather [name] is a pleasant 89 y.o. female who presents today for evaluation of ongoing right hand stiffness and pain.  She has a history of chronic pain and stiffness throughout the right hand, feels that the stiffness may have worsened more recently particular with digital extension.  Pertinent ROS were reviewed with the patient and found to be negative unless otherwise specified above in HPI.   Visit Reason: right hand Hand dominance: right Occupation: n/a Diabetic: No Heart/Lung History: none Blood Thinners: none  Prior Testing/EMG: xrays 07/24/23 Injections (Date): none Treatments: none Prior Surgery:none  Assessment & Plan: Visit Diagnoses: No diagnosis found.  Plan: Extensive discussion was had the patient today regarding her right hand complaints.  I reviewed the results of her recent x-ray which do show diffuse arthritis throughout the small joints of the right hand particularly at the [facility] and PIP joints.  This is undoubtedly causing significant stiffness given the near joint obliteration seen on multiple views particularly at the thumb [facility] and PIP of the index and long finger.  We discussed treatment allergies ranging from conservative to surgical.  From a conservative standpoint, I did emphasize utilizing putty or a squeeze ball to improve digital range of motion.  We discussed medications in the form of anti-inflammatory as well as topical Voltaren which she can utilize as needed for soreness in the small joints.  From a surgical standpoint, given the significant arthritis at the thumb [facility] and PIP joints, radiographically there would be consideration for arthroplasty, however given her age and comorbidities the risk-benefit profile of this  is difficult.  Patient expressed full understanding.  Follow-up: No follow-ups on file.   Meds & Orders: No orders of the defined types were placed in this encounter.  No orders of the defined types were placed in this encounter.    Procedures: No procedures performed      Clinical History: No specialty comments available.  She reports that she has never smoked. She has never used smokeless tobacco. No results for input(s): "HGBA1C", "LABURIC" in the last 8760 hours.  Objective:   Vital Signs: There were no vitals taken for this visit.  Physical Exam  Gen: Well-appearing, in no acute distress; non-toxic CV: Regular Rate. Well-perfused. Warm.  Resp: Breathing unlabored on room air; no wheezing. Psych: Fluid speech in conversation; appropriate affect; normal thought process  Ortho Exam Right hand: - Restricted extension of the digits index through small finger, slightly improved passively - Positive grind at the thumb [facility] articulation with MP hyperextension, notable crepitus - Mild swelling diffusely throughout the digits, mild tenderness at the PIP of the index and long finger with deep palpation - Sensation is intact distally, hand is warm well-perfused  Imaging: No results found.  Past Medical/Family/Surgical/Social History: Medications & Allergies reviewed per EMR, new medications updated. Patient Active Problem List   Diagnosis Date Noted   ABLA 11/23/2021   Lab test positive for detection of COVID-19 virus 11/23/2021   Acute metabolic encephalopathy 11/23/2021   Pressure injury of skin 11/19/2021   Closed displaced fracture of right femoral neck (HCC) 11/11/2021   Restlessness 11/11/2021   Seasonal allergies 06/14/2021   Primary osteoarthritis involving multiple joints 06/14/2021   Pre-ulcerative corn or callous 10/04/2020   Osteoporosis  05/22/2020   Kyphosis of thoracic region 05/17/2020   DDD (degenerative disc disease), lumbar 10/04/2019   Hearing decreased,  bilateral 06/30/2019   Chronic pain syndrome 03/03/2019   Unilateral primary osteoarthritis, right knee 01/13/2019   Medication management 02/28/2018   Urinary incontinence without sensory awareness 12/02/2017   Arthritis of left hip 10/20/2017   Functional abdominal pain syndrome 09/02/2017   Encounter for chronic pain management 07/22/2017   Dementia with behavioral disturbance 06/25/2017   Vitamin D deficiency 05/21/2017   Chronic LLQ pain 11/23/2014   Esophageal dysmotility 10/27/2014   Arthritis of hand, degenerative 04/10/2014   Other and unspecified hyperlipidemia 04/10/2014   Hx of breast cancer 08/30/2012   Venous insufficiency of both lower extremities 06/12/2011   GERD without esophagitis 03/27/2010   Chronic diastolic CHF (congestive heart failure) (HCC) 03/21/2009   Constipation 01/18/2009   Cervical spondylosis without myelopathy 03/21/2008   Diaphragmatic hernia 12/26/2007   Anxiety state 09/15/2007   Primary hypertension 09/15/2007   Venous (peripheral) insufficiency 09/15/2007   Past Medical History:  Diagnosis Date   Abdominal pain, left lower quadrant 06/22/2013   Abnormal CT scan of lung 07/16/2015   Allergic rhinitis, cause unspecified 02/21/2014   Anxiety state, unspecified    Breast cancer (HCC)    NO STICK OR BLOOD PRESSURE CHECKS IN LEFT ARM   Carpal tunnel syndrome    Carpal tunnel syndrome on both sides 10/03/2008   Qualifier: Diagnosis of  By: Kriste Basque MD, Lonzo Cloud    Cervical spondylosis    Chronic pain syndrome 03/03/2019   Colon polyp 12/19/2009   Transverse-polypoid colorectal mucosa   Constipation    Cough 10/21/2014   Degenerative joint disease    Diastolic dysfunction    Diverticula of colon    Diverticulosis of colon 05/24/2002   Qualifier: Diagnosis of  By: Thereasa Solo     Gastritis, chronic    Hiatal hernia    Hypertension    Low back pain    LPRD (laryngopharyngeal reflux disease) 04/04/2014   Osteoporosis 05/22/2020   RBBB (right  bundle branch block with left anterior fascicular block)    Renal cyst    Venous insufficiency    Family History  Problem Relation Age of Onset   Colon cancer Neg Hx    Pancreatic cancer Neg Hx    Esophageal cancer Neg Hx    Stomach cancer Neg Hx    Past Surgical History:  Procedure Laterality Date   CARPAL TUNNEL RELEASE     INTRAMEDULLARY (IM) NAIL INTERTROCHANTERIC Right 11/13/2021   Procedure: INTRAMEDULLARY (IM) NAIL INTERTROCHANTRIC;  Surgeon: Huel Cote, MD;  Location: [facility];  Service: Orthopedics;  Laterality: Right;   Left mastectomy     Social History   Occupational History   Occupation: Retired    Associate Professor: RETIRED   Occupation: Naval architect  Tobacco Use   Smoking status: Never   Smokeless tobacco: Never  Vaping Use   Vaping status: Never Used  Substance and Sexual Activity   Alcohol use: No    Alcohol/week: 0.0 standard drinks of alcohol   Drug use: No   Sexual activity: Not on file    Etter Royall Trevor Mace, M.D. [facility] 10:49 AM

## 2023-08-03 ENCOUNTER — Other Ambulatory Visit: Payer: Self-pay | Admitting: Family

## 2023-08-03 DIAGNOSIS — F0394 Unspecified dementia, unspecified severity, with anxiety: Secondary | ICD-10-CM | POA: Diagnosis not present

## 2023-08-03 DIAGNOSIS — K2950 Unspecified chronic gastritis without bleeding: Secondary | ICD-10-CM | POA: Diagnosis not present

## 2023-08-03 DIAGNOSIS — F411 Generalized anxiety disorder: Secondary | ICD-10-CM | POA: Diagnosis not present

## 2023-08-03 DIAGNOSIS — G894 Chronic pain syndrome: Secondary | ICD-10-CM | POA: Diagnosis not present

## 2023-08-03 DIAGNOSIS — K449 Diaphragmatic hernia without obstruction or gangrene: Secondary | ICD-10-CM | POA: Diagnosis not present

## 2023-08-03 DIAGNOSIS — L97811 Non-pressure chronic ulcer of other part of right lower leg limited to breakdown of skin: Secondary | ICD-10-CM | POA: Diagnosis not present

## 2023-08-03 DIAGNOSIS — M519 Unspecified thoracic, thoracolumbar and lumbosacral intervertebral disc disorder: Secondary | ICD-10-CM | POA: Diagnosis not present

## 2023-08-03 DIAGNOSIS — Z853 Personal history of malignant neoplasm of breast: Secondary | ICD-10-CM | POA: Diagnosis not present

## 2023-08-03 DIAGNOSIS — I872 Venous insufficiency (chronic) (peripheral): Secondary | ICD-10-CM | POA: Diagnosis not present

## 2023-08-03 DIAGNOSIS — M47812 Spondylosis without myelopathy or radiculopathy, cervical region: Secondary | ICD-10-CM | POA: Diagnosis not present

## 2023-08-03 DIAGNOSIS — I5032 Chronic diastolic (congestive) heart failure: Secondary | ICD-10-CM | POA: Diagnosis not present

## 2023-08-03 DIAGNOSIS — I4510 Unspecified right bundle-branch block: Secondary | ICD-10-CM | POA: Diagnosis not present

## 2023-08-03 DIAGNOSIS — I739 Peripheral vascular disease, unspecified: Secondary | ICD-10-CM | POA: Diagnosis not present

## 2023-08-03 DIAGNOSIS — M810 Age-related osteoporosis without current pathological fracture: Secondary | ICD-10-CM | POA: Diagnosis not present

## 2023-08-03 DIAGNOSIS — I10 Essential (primary) hypertension: Secondary | ICD-10-CM

## 2023-08-03 DIAGNOSIS — I110 Hypertensive heart disease with heart failure: Secondary | ICD-10-CM | POA: Diagnosis not present

## 2023-08-03 DIAGNOSIS — Z556 Problems related to health literacy: Secondary | ICD-10-CM | POA: Diagnosis not present

## 2023-08-04 DIAGNOSIS — F411 Generalized anxiety disorder: Secondary | ICD-10-CM | POA: Diagnosis not present

## 2023-08-04 DIAGNOSIS — I5032 Chronic diastolic (congestive) heart failure: Secondary | ICD-10-CM | POA: Diagnosis not present

## 2023-08-04 DIAGNOSIS — M519 Unspecified thoracic, thoracolumbar and lumbosacral intervertebral disc disorder: Secondary | ICD-10-CM | POA: Diagnosis not present

## 2023-08-04 DIAGNOSIS — M47812 Spondylosis without myelopathy or radiculopathy, cervical region: Secondary | ICD-10-CM | POA: Diagnosis not present

## 2023-08-04 DIAGNOSIS — K2950 Unspecified chronic gastritis without bleeding: Secondary | ICD-10-CM | POA: Diagnosis not present

## 2023-08-04 DIAGNOSIS — G894 Chronic pain syndrome: Secondary | ICD-10-CM | POA: Diagnosis not present

## 2023-08-04 DIAGNOSIS — M810 Age-related osteoporosis without current pathological fracture: Secondary | ICD-10-CM | POA: Diagnosis not present

## 2023-08-04 DIAGNOSIS — I739 Peripheral vascular disease, unspecified: Secondary | ICD-10-CM | POA: Diagnosis not present

## 2023-08-04 DIAGNOSIS — K449 Diaphragmatic hernia without obstruction or gangrene: Secondary | ICD-10-CM | POA: Diagnosis not present

## 2023-08-04 DIAGNOSIS — Z853 Personal history of malignant neoplasm of breast: Secondary | ICD-10-CM | POA: Diagnosis not present

## 2023-08-04 DIAGNOSIS — I872 Venous insufficiency (chronic) (peripheral): Secondary | ICD-10-CM | POA: Diagnosis not present

## 2023-08-04 DIAGNOSIS — I110 Hypertensive heart disease with heart failure: Secondary | ICD-10-CM | POA: Diagnosis not present

## 2023-08-04 DIAGNOSIS — Z556 Problems related to health literacy: Secondary | ICD-10-CM | POA: Diagnosis not present

## 2023-08-04 DIAGNOSIS — I4510 Unspecified right bundle-branch block: Secondary | ICD-10-CM | POA: Diagnosis not present

## 2023-08-04 DIAGNOSIS — L97811 Non-pressure chronic ulcer of other part of right lower leg limited to breakdown of skin: Secondary | ICD-10-CM | POA: Diagnosis not present

## 2023-08-04 DIAGNOSIS — F0394 Unspecified dementia, unspecified severity, with anxiety: Secondary | ICD-10-CM | POA: Diagnosis not present

## 2023-08-07 ENCOUNTER — Ambulatory Visit
Admission: RE | Admit: 2023-08-07 | Discharge: 2023-08-07 | Disposition: A | Discharge status: Home / Self Care | Payer: Medicare Other | Source: Ambulatory Visit | Attending: Adult Health | Admitting: Adult Health

## 2023-08-07 ENCOUNTER — Ambulatory Visit (INDEPENDENT_AMBULATORY_CARE_PROVIDER_SITE_OTHER): Payer: Medicare Other | Admitting: Adult Health

## 2023-08-07 ENCOUNTER — Telehealth: Payer: Self-pay

## 2023-08-07 ENCOUNTER — Encounter: Payer: Self-pay | Admitting: Adult Health

## 2023-08-07 VITALS — BP 116/64 | HR 80 | Temp 98.5°F | Resp 17 | Ht <= 58 in

## 2023-08-07 DIAGNOSIS — M25552 Pain in left hip: Secondary | ICD-10-CM

## 2023-08-07 DIAGNOSIS — R600 Localized edema: Secondary | ICD-10-CM | POA: Diagnosis not present

## 2023-08-07 DIAGNOSIS — M1612 Unilateral primary osteoarthritis, left hip: Secondary | ICD-10-CM | POA: Diagnosis not present

## 2023-08-07 DIAGNOSIS — I5032 Chronic diastolic (congestive) heart failure: Secondary | ICD-10-CM | POA: Diagnosis not present

## 2023-08-07 DIAGNOSIS — Z23 Encounter for immunization: Secondary | ICD-10-CM

## 2023-08-07 NOTE — Progress Notes (Signed)
-    no definite acute fracture of left hip -   moderate osteoarthritis noted -  continue Acetaminophen PRN for pain

## 2023-08-07 NOTE — Telephone Encounter (Signed)
Patient son called and left voicemail on clinical intake phone stating that his mother needs to be seen. He states that she needs a possible X-ray due to hip pain. I called back and he notified me that she was schedule by [facility] staff. Message routed to Ella Bodo, NP as Lorain Childes.

## 2023-08-07 NOTE — Progress Notes (Signed)
[facility] clinic  Provider:  Kenard Gower DNP  Code Status:  Full Code  Goals of Care:     07/24/2023    2:03 PM  Advanced Directives  Does Patient Have a Medical Advance Directive? No     Chief Complaint  Patient presents with   Acute Visit    Patient is being seen for left hip pain patient fell out chair   Immunizations    Patient is due for a flu vaccine     HPI: Patient is a 89 y.o. female seen today for an acute visit for left hip pain. She was accompanied today by her caregiver. She stated that she slid down from her chair to the floor in her kitchen yesterday. Son helped her get up.  She is complaining of pain on her left hip, 5/10 pain. She takes Acetaminophen PRN and Diclofenac gel PRN for pain. No shortness of breath of breath.   Past Medical History:  Diagnosis Date   Abdominal pain, left lower quadrant 06/22/2013   Abnormal CT scan of lung 07/16/2015   Allergic rhinitis, cause unspecified 02/21/2014   Anxiety state, unspecified    Breast cancer (HCC)    NO STICK OR BLOOD PRESSURE CHECKS IN LEFT ARM   Carpal tunnel syndrome    Carpal tunnel syndrome on both sides 10/03/2008   Qualifier: Diagnosis of  By: Kriste Basque MD, Lonzo Cloud    Cervical spondylosis    Chronic pain syndrome 03/03/2019   Colon polyp 12/19/2009   Transverse-polypoid colorectal mucosa   Constipation    Cough 10/21/2014   Degenerative joint disease    Diastolic dysfunction    Diverticula of colon    Diverticulosis of colon 05/24/2002   Qualifier: Diagnosis of  By: Thereasa Solo     Gastritis, chronic    Hiatal hernia    Hypertension    Low back pain    LPRD (laryngopharyngeal reflux disease) 04/04/2014   Osteoporosis 05/22/2020   RBBB (right bundle branch block with left anterior fascicular block)    Renal cyst    Venous insufficiency     Past Surgical History:  Procedure Laterality Date   CARPAL TUNNEL RELEASE     INTRAMEDULLARY (IM) NAIL INTERTROCHANTERIC Right 11/13/2021    Procedure: INTRAMEDULLARY (IM) NAIL INTERTROCHANTRIC;  Surgeon: Huel Cote, MD;  Location: [facility];  Service: Orthopedics;  Laterality: Right;   Left mastectomy      Allergies  Allergen Reactions   Fentanyl Nausea Only and Other (See Comments)    Pt states that she is not allergic     Naprosyn [Naproxen] Other (See Comments)    Patient felt tongue and face swelling. Went to ED where no visible swelling noted    Outpatient Encounter Medications as of 08/07/2023  Medication Sig   acetaminophen (TYLENOL) 500 MG tablet Take 500 mg by mouth every 6 (six) hours as needed for moderate pain or headache.   carvedilol (COREG) 3.125 MG tablet TAKE 1 TABLET BY MOUTH TWICE DAILY. HOLD FOR SBP <110 OR IF HR <70   cholecalciferol (VITAMIN D) 1000 units tablet Take 1,000 Units by mouth daily.   famotidine (PEPCID) 20 MG tablet Take 1 tablet (20 mg total) by mouth daily.   feeding supplement, ENSURE ENLIVE, (ENSURE ENLIVE) LIQD Take 237 mLs by mouth 2 (two) times daily between meals.   furosemide (LASIX) 20 MG tablet Take 0.5 tablets (10 mg total) by mouth daily.   lidocaine (LIDODERM) 5 % Place 1 patch onto the skin as  needed. Remove & Discard patch within 12 hours or as directed by MD   LORazepam (ATIVAN) 0.5 MG tablet Take 0.5 mg by mouth as needed.   Multiple Vitamins-Minerals (MULTIVITAMINS THER. W/MINERALS) TABS Take 1 tablet by mouth daily.   mupirocin ointment (BACTROBAN) 2 % Apply to top of left foot once daily every morning.   polyethylene glycol (MIRALAX / GLYCOLAX) 17 g packet Take 17 g by mouth daily.   Propylene Glycol (SYSTANE COMPLETE OP) Apply 1 drop to eye daily as needed (dry eyes).   sennosides-docusate sodium (SENOKOT-S) 8.6-50 MG tablet Take 1-2 tablets by mouth daily as needed for constipation.   spironolactone (ALDACTONE) 25 MG tablet Take 1 tablet (25 mg total) by mouth daily.   triamcinolone cream (KENALOG) 0.1 % Apply 1 Application topically 2 (two) times daily.   No  facility-administered encounter medications on file as of 08/07/2023.    Review of Systems:  Review of Systems  Constitutional:  Negative for appetite change, chills, fatigue and fever.  HENT:  Negative for congestion, hearing loss, rhinorrhea and sore throat.   Eyes: Negative.   Respiratory:  Negative for cough, shortness of breath and wheezing.   Cardiovascular:  Negative for chest pain, palpitations and leg swelling.  Gastrointestinal:  Negative for abdominal pain, constipation, diarrhea, nausea and vomiting.  Genitourinary:  Negative for dysuria.  Musculoskeletal:  Negative for arthralgias, back pain and myalgias.       Left hip pain  Skin:  Negative for color change, rash and wound.  Neurological:  Negative for dizziness, weakness and headaches.  Psychiatric/Behavioral:  Negative for behavioral problems. The patient is not nervous/anxious.     Health Maintenance  Topic Date Due   INFLUENZA VACCINE  06/18/2023   Medicare Annual Wellness (AWV)  08/22/2023   Pneumonia Vaccine 38+ Years old (2 of 2 - PCV) 08/22/2023 (Originally 02/07/2016)   DEXA SCAN  Completed   HPV VACCINES  Aged Out   DTaP/Tdap/Td  Discontinued   COVID-19 Vaccine  Discontinued   Zoster Vaccines- Shingrix  Discontinued    Physical Exam: Vitals:   08/07/23 1107  BP: 116/64  Pulse: 80  Resp: 17  Temp: 98.5 F (36.9 C)  TempSrc: Temporal  SpO2: 94%  Height: 4\' 10"  (1.473 m)   Body mass index is 22.99 kg/m. Physical Exam Constitutional:      General: She is in acute distress.     Appearance: Normal appearance.  HENT:     Head: Normocephalic and atraumatic.     Nose: Nose normal.     Mouth/Throat:     Mouth: Mucous membranes are moist.  Eyes:     Conjunctiva/sclera: Conjunctivae normal.  Cardiovascular:     Rate and Rhythm: Normal rate and regular rhythm.  Pulmonary:     Effort: Pulmonary effort is normal.     Breath sounds: Normal breath sounds.  Abdominal:     General: Bowel sounds are  normal.     Palpations: Abdomen is soft.  Musculoskeletal:        General: Swelling present.     Cervical back: Normal range of motion.     Right lower leg: Edema present.     Left lower leg: Edema present.     Comments: BLE 2+edema  Skin:    General: Skin is warm and dry.  Neurological:     Mental Status: She is alert. Mental status is at baseline.     Comments: Alert to self and place, disoriented to time.  Psychiatric:  Mood and Affect: Mood normal.        Behavior: Behavior normal.     Labs reviewed: Basic Metabolic Panel: Recent Labs    06/08/23 1133  NA 142  K 4.7  CL 105  CO2 31  GLUCOSE 88  BUN 26*  CREATININE 0.98*  CALCIUM 9.5  TSH 2.55   Liver Function Tests: Recent Labs    06/08/23 1133  AST 23  ALT 18  BILITOT 0.5  PROT 6.3   No results for input(s): "LIPASE", "AMYLASE" in the last 8760 hours. No results for input(s): "AMMONIA" in the last 8760 hours. CBC: Recent Labs    06/08/23 1133  WBC 5.5  NEUTROABS 3,465  HGB 12.5  HCT 38.6  MCV 93.5  PLT 239   Lipid Panel: Recent Labs    06/08/23 1133  CHOL 174  HDL 73  LDLCALC 89  TRIG 42  CHOLHDL 2.4   No results found for: "HGBA1C"  Procedures since last visit: DG Forearm Right  Result Date: 08/05/2023 CLINICAL DATA:  Pain and weakness for weeks. EXAM: RIGHT FOREARM - 2 VIEW COMPARISON:  None Available. FINDINGS: There is no evidence of fracture or other focal bone lesions. Soft tissues are unremarkable. IMPRESSION: Negative. Electronically Signed   By: Layla Maw M.D.   On: 08/05/2023 19:46   DG Humerus Right  Result Date: 08/05/2023 CLINICAL DATA:  Pain and stiffness for weeks. EXAM: RIGHT HUMERUS - 2 VIEW COMPARISON:  None Available. FINDINGS: No fracture, dislocation or subluxation. Glenohumeral and acromioclavicular degenerative changes identified with joint space narrowing and osteophytes. Diminished acromial humeral distance suggesting underlying rotator cuff defect.  IMPRESSION: Degenerative changes.  No acute osseous abnormalities. Electronically Signed   By: Layla Maw M.D.   On: 08/05/2023 19:45   DG Hand Complete Right  Result Date: 08/05/2023 CLINICAL DATA:  Pain and stiffness for weeks. EXAM: RIGHT HAND - COMPLETE 3 VIEW COMPARISON:  12/10/2004. FINDINGS: Osseous structures are osteopenic. Sclerosis and joint space narrowing with osteophytes base of the thumb at first metacarpal-carpal joint. There is also degenerative change of the proximal and distal interphalangeal joints of the middle finger and the distal interphalangeal joint of the index finger. No fracture, dislocation or subluxation. No bony destructive process. No radiopaque foreign bodies. IMPRESSION: Osteopenia.  Degenerative changes.  No acute osseous abnormalities. Electronically Signed   By: Layla Maw M.D.   On: 08/05/2023 19:43    Assessment/Plan  1. Left hip pain -  has left hip tenderness S/P fall yesterday -  continue Acetaminophen PRN for pain  - DG Hip Unilat W OR W/O Pelvis Min 4 Views Left  2. Chronic diastolic congestive heart failure (HCC) -  no shortness of breath -  continue Furosemide and Carvedilol  3. Edema of both lower extremities -  continue Furosemide -  elevated BLE  4. Flu vaccine need -  Flu vaccine given today     Labs/tests ordered:  DG Hip Unilat W OR W/O Pelvis Min 4 Views Left   Next appt:  12/15/2023

## 2023-08-11 ENCOUNTER — Telehealth: Payer: Self-pay | Admitting: Orthopedic Surgery

## 2023-08-11 ENCOUNTER — Other Ambulatory Visit: Payer: Self-pay | Admitting: Orthopedic Surgery

## 2023-08-11 DIAGNOSIS — M19041 Primary osteoarthritis, right hand: Secondary | ICD-10-CM

## 2023-08-11 NOTE — Telephone Encounter (Signed)
Pt's son Casimiro Needle called stating pt had an appt with Dr Fara Boros last week and referral for physical therapy was to be sent but no referral on pt chart. Please send referral for pt. Please call pt's son at [phone].

## 2023-08-12 DIAGNOSIS — G894 Chronic pain syndrome: Secondary | ICD-10-CM | POA: Diagnosis not present

## 2023-08-12 DIAGNOSIS — L97811 Non-pressure chronic ulcer of other part of right lower leg limited to breakdown of skin: Secondary | ICD-10-CM | POA: Diagnosis not present

## 2023-08-12 DIAGNOSIS — K449 Diaphragmatic hernia without obstruction or gangrene: Secondary | ICD-10-CM | POA: Diagnosis not present

## 2023-08-12 DIAGNOSIS — F411 Generalized anxiety disorder: Secondary | ICD-10-CM | POA: Diagnosis not present

## 2023-08-12 DIAGNOSIS — I4510 Unspecified right bundle-branch block: Secondary | ICD-10-CM | POA: Diagnosis not present

## 2023-08-12 DIAGNOSIS — K2950 Unspecified chronic gastritis without bleeding: Secondary | ICD-10-CM | POA: Diagnosis not present

## 2023-08-12 DIAGNOSIS — F0394 Unspecified dementia, unspecified severity, with anxiety: Secondary | ICD-10-CM | POA: Diagnosis not present

## 2023-08-12 DIAGNOSIS — M810 Age-related osteoporosis without current pathological fracture: Secondary | ICD-10-CM | POA: Diagnosis not present

## 2023-08-12 DIAGNOSIS — I739 Peripheral vascular disease, unspecified: Secondary | ICD-10-CM | POA: Diagnosis not present

## 2023-08-12 DIAGNOSIS — I110 Hypertensive heart disease with heart failure: Secondary | ICD-10-CM | POA: Diagnosis not present

## 2023-08-12 DIAGNOSIS — Z556 Problems related to health literacy: Secondary | ICD-10-CM | POA: Diagnosis not present

## 2023-08-12 DIAGNOSIS — M47812 Spondylosis without myelopathy or radiculopathy, cervical region: Secondary | ICD-10-CM | POA: Diagnosis not present

## 2023-08-12 DIAGNOSIS — I5032 Chronic diastolic (congestive) heart failure: Secondary | ICD-10-CM | POA: Diagnosis not present

## 2023-08-12 DIAGNOSIS — M519 Unspecified thoracic, thoracolumbar and lumbosacral intervertebral disc disorder: Secondary | ICD-10-CM | POA: Diagnosis not present

## 2023-08-12 DIAGNOSIS — Z853 Personal history of malignant neoplasm of breast: Secondary | ICD-10-CM | POA: Diagnosis not present

## 2023-08-12 DIAGNOSIS — I872 Venous insufficiency (chronic) (peripheral): Secondary | ICD-10-CM | POA: Diagnosis not present

## 2023-08-14 NOTE — Therapy (Signed)
OUTPATIENT OCCUPATIONAL THERAPY ORTHO EVALUATION  Patient Name: Heather [name] MRN: [id number] DOB:[birth year], 89 y.o., female Today's Date: 08/17/2023  PCP: Richarda Blade, NP REFERRING PROVIDER: Samuella Cota, MD   END OF SESSION:  OT End of Session - 08/17/23 1350     Visit Number 1    Number of Visits 1    Authorization Type BC[employer]    OT Start Time 1350    OT Stop Time 1459    OT Time Calculation (min) 69 min    Equipment Utilized During Treatment orthotic materials    Activity Tolerance Patient tolerated treatment well;No increased pain;Patient limited by fatigue;Patient limited by lethargy    Behavior During Therapy [facility] for tasks assessed/performed;Impulsive             Past Medical History:  Diagnosis Date   Abdominal pain, left lower quadrant 06/22/2013   Abnormal CT scan of lung 07/16/2015   Allergic rhinitis, cause unspecified 02/21/2014   Anxiety state, unspecified    Breast cancer (HCC)    NO STICK OR BLOOD PRESSURE CHECKS IN LEFT ARM   Carpal tunnel syndrome    Carpal tunnel syndrome on both sides 10/03/2008   Qualifier: Diagnosis of  By: Kriste Basque MD, Lonzo Cloud    Cervical spondylosis    Chronic pain syndrome 03/03/2019   Colon polyp 12/19/2009   Transverse-polypoid colorectal mucosa   Constipation    Cough 10/21/2014   Degenerative joint disease    Diastolic dysfunction    Diverticula of colon    Diverticulosis of colon 05/24/2002   Qualifier: Diagnosis of  By: Thereasa Solo     Gastritis, chronic    Hiatal hernia    Hypertension    Low back pain    LPRD (laryngopharyngeal reflux disease) 04/04/2014   Osteoporosis 05/22/2020   RBBB (right bundle branch block with left anterior fascicular block)    Renal cyst    Venous insufficiency    Past Surgical History:  Procedure Laterality Date   CARPAL TUNNEL RELEASE     INTRAMEDULLARY (IM) NAIL INTERTROCHANTERIC Right 11/13/2021   Procedure: INTRAMEDULLARY (IM) NAIL INTERTROCHANTRIC;  Surgeon: Huel Cote, MD;  Location: [facility];  Service: Orthopedics;  Laterality: Right;   Left mastectomy     Patient Active Problem List   Diagnosis Date Noted   ABLA 11/23/2021   Lab test positive for detection of COVID-19 virus 11/23/2021   Acute metabolic encephalopathy 11/23/2021   Pressure injury of skin 11/19/2021   Closed displaced fracture of right femoral neck (HCC) 11/11/2021   Restlessness 11/11/2021   Seasonal allergies 06/14/2021   Primary osteoarthritis involving multiple joints 06/14/2021   Pre-ulcerative corn or callous 10/04/2020   Osteoporosis 05/22/2020   Kyphosis of thoracic region 05/17/2020   DDD (degenerative disc disease), lumbar 10/04/2019   Hearing decreased, bilateral 06/30/2019   Chronic pain syndrome 03/03/2019   Unilateral primary osteoarthritis, right knee 01/13/2019   Medication management 02/28/2018   Urinary incontinence without sensory awareness 12/02/2017   Arthritis of left hip 10/20/2017   Functional abdominal pain syndrome 09/02/2017   Encounter for chronic pain management 07/22/2017   Dementia with behavioral disturbance 06/25/2017   Vitamin D deficiency 05/21/2017   Chronic LLQ pain 11/23/2014   Esophageal dysmotility 10/27/2014   Arthritis of hand, degenerative 04/10/2014   Other and unspecified hyperlipidemia 04/10/2014   Hx of breast cancer 08/30/2012   Venous insufficiency of both lower extremities 06/12/2011   GERD without esophagitis 03/27/2010   Chronic diastolic CHF (congestive heart failure) (HCC)  03/21/2009   Constipation 01/18/2009   Cervical spondylosis without myelopathy 03/21/2008   Diaphragmatic hernia 12/26/2007   Anxiety state 09/15/2007   Primary hypertension 09/15/2007   Venous (peripheral) insufficiency 09/15/2007    ONSET DATE: chronic   REFERRING DIAG: M19.041 (ICD-10-CM) - Arthritis of right hand   THERAPY DIAG:  Muscle weakness (generalized)  Other lack of coordination  Stiffness of right hand, not elsewhere  classified  Rationale for Evaluation and Treatment: Rehabilitation  SUBJECTIVE:   SUBJECTIVE STATEMENT: She arrives with her son and states that she "cannot move her hand or right arm."  This is apparently not true as OTCs are moving her arm itches looks very stiff and swollen with arthritis.  She talks quietly about seeing "worms in her veins" and other things that show she does not have full cognitive orientation.  Her son states that her thumb has not been working well for a long time now and that recently she is almost given up using her right dominant hand.  She shows some learned helplessness and avoidance of using her right hand.  She is wheelchair-bound now for a long time per her son.  She states that she has no pain in her hand or arm   PERTINENT HISTORY: Work on ROM; thumb CMC arthritis "x-ray which do show diffuse arthritis throughout the small joints of the right hand particularly at the [facility] and PIP joints. This is undoubtedly causing significant stiffness given the near joint obliteration seen on multiple views particularly at the thumb CMC and PIP of the index and long finger. "  PRECAUTIONS: Fall  RED FLAGS: None   WEIGHT BEARING RESTRICTIONS: No  PAIN:  Are you having pain? No  FALLS: Has patient fallen in last 6 months? No  LIVING ENVIRONMENT: Lives with: lives with their son Has following equipment at home: Wheelchair (manual)  PLOF: Requires assistive device for independence, Needs assistance with ADLs, Needs assistance with homemaking, Needs assistance with gait, and Needs assistance with transfers  PATIENT GOALS: To be able to use her right hand    OBJECTIVE: (All objective assessments below are from initial evaluation on: 08/17/23 unless otherwise specified.)   HAND DOMINANCE: Right   ADLs: Overall ADLs: The son states for the past month she has been needing maximal or dependent assistance to use her right dominant hand   UPPER EXTREMITY ROM      Shoulder to Wrist AROM Right eval  Shoulder flexion Chronically limited  Shoulder abduction Chronically limited  Shoulder extension   Shoulder internal rotation   Shoulder external rotation   Elbow flexion WFL  Elbow extension WFL  Forearm supination WFL  Forearm pronation  WFL  Wrist flexion WFL  Wrist extension Chronically limited  (Blank rows = not tested)   Hand AROM Right eval  Full Fist Ability (or Gap to Distal Palmar Crease) Chronically limited, with greater difficulty opening her hand   Thumb Opposition  (Kapandji Scale)  Unable to oppose to index finger, tight webspace, collapse deformity    (Blank rows = not tested)   HAND FUNCTION: Eval: Observed weakness in affected Rt  hand > Lt hand  COORDINATION: Eval: Observed coordination impairments with affected Rt hand.  She cannot pick up a napkin or a 1 inch cube with her right hand initially, but after some therapy treatment today and encouragement she can pick up a 1 inch cube.  After orthotic fabrication she can do this more easily.   SENSATION: Eval:  Light touch intact today  EDEMA:   Eval: Typical swelling like OA in the MP MCP joints of both hands.  COGNITION: Eval: Overall cognitive status: She seems to have deficits with cognitive processing tasks, abstract thinking, following directions at times, and problem solving.  She seems dependent for most cognitive functions on her caregiver/her son.  She does chat calmly today, but states strange things like there are "worms in her veins" and "things come out of my skin."  This is somewhat typical behavior for her per her son  OBSERVATIONS:   Eval: She seems to have the ability to flex and extend her fingers, but has great difficulty to radially abduct her thumb which is severely broken down by arthritis.  She can also move her forearm and wrist though wrist extension seems difficult.  Her abilities do improve with encouragement and coaching and though finger  extension seems difficult, she has good relative finger and wrist extension.  Which is seems weak and inhibited by disuse and learned helplessness, and is exacerbated by difficulty moving the thumb.   TODAY'S TREATMENT:  Post-evaluation treatment:   First, to help support active grasp and pinch, OT fabricates a custom hand-based thumb spica orthosis leaving the IP joint free.  When wearing this her thumb is more radially abducted in a functional position and she demonstrates good ability to pick up items in a normal pinch pattern.  She states it does not hurt her or bother her, but she was advised to only wear it 3 or 4 hours in the day with activities, and her son should remove it for nighttime and to check her skin to make sure there is no harsh irritation as she is elderly and her skin is frail.    OT educated her and her son especially on the following home exercises to keep her engaged, moving, active and loose and functional as possible.  Some of them include stretches to extend her fingers gently as tolerated.  OT did this with her today and almost achieved a full open palm and hand after several stretches.  Additionally, OT had her performing functional activities like picking up blocks also holding and using utensils with build up foam that was supplied, advising the son to get adaptive equipment that is larger handles for home.  OT also advised the son to set her up with small tasks like folding laundry at the table or moving cotton balls into a jar-forcing her to use her right hand and thereby maintain and improve function.  She had no pain with stretches, active range of motion, or functional activities in the session today.  Lastly, as she is obviously homebound and unable to care for herself without maximum assistance, they were advised to start home health therapy where an OT could come and work on her upper body function and hand use in the home where this would be safer for her, more  functional for her, and less burden to caregivers for transportation, etc.  OT reached out to the physician to recommend this and the message was received.  She will discharge today with all of these recommendations.  Exercises - Reach arms upward   - 4 x daily - 10 reps - Turn [employer] Facing Up & Down  - 4-6 x daily - 10-15 reps - Bend and Pull Back Wrist SLOWLY  - 4 x daily - 10-15 reps - PUSH KNUCKLES DOWN  - 4 x daily - 3-5 reps - 15 seconds hold - Tendon Glides  - 4-6  x daily - 3-5 reps - 2-3 seconds hold  PATIENT EDUCATION: Education details: See tx section above for details  Person educated: Patient Education method: Verbal Instruction, Teach back, Handouts  Education comprehension: States and demonstrates understanding   HOME EXERCISE PROGRAM: Access Code: [email or web address] URL: [email or web address] Date: 08/17/2023 Prepared by: Fannie Knee   GOALS: Goals reviewed with patient? Yes   SHORT TERM GOALS: (STG required if POC>30 days) Target Date: 08/17/23  Pt will obtain protective, custom orthotic. Goal status:  MET   2.  Pt / caregiver will demo/state understanding of initial HEP and therapy recommendations to improve functional ability with her upper extremities and engage her in more self-care tasks.   Goal status: MET    ASSESSMENT:  CLINICAL IMPRESSION: Patient is a 89 y.o. female who was seen today for occupational therapy evaluation for a stiff right dominant hand that is nonpainful but is weak and lacks coordination.  OT feels that this is mostly habit based and the lack of personal motivation and in need for adapting her environment and encouraging her to keep her hands and arms moving.  It also may be due to learned helplessness from caregivers doing many things for her.  In any case, while she did benefit from a therapy session today in a custom orthosis, trying to remediate these problems will take significant amounts of therapy that would be a burden  for her in an outpatient setting-as she is homebound status.  Her son was recommended to have a home health OT come in to work on her function with her hands and arms, and he agreed.  OT reached out to her PCP and let her know that this was the best setting for her therapy at this time.  She will discharge outpatient therapy after this visit today.   PERFORMANCE DEFICITS: in functional skills including ADLs, coordination, dexterity, ROM, strength, Fine motor control, Gross motor control, endurance, decreased knowledge of precautions, and UE functional use, cognitive skills including energy/drive, memory, perception, problem solving, safety awareness, and sequencing, and psychosocial skills including coping strategies, environmental adaptation, and habits.   IMPAIRMENTS: are limiting patient from ADLs, IADLs, rest and sleep, leisure, and social participation.   COMORBIDITIES: may have co-morbidities  that affects occupational performance. Patient will benefit from skilled OT to address above impairments and improve overall function.  MODIFICATION OR ASSISTANCE TO COMPLETE EVALUATION: Min-Moderate modification of tasks or assist with assess necessary to complete an evaluation.  OT OCCUPATIONAL PROFILE AND HISTORY: Problem focused assessment: Including review of records relating to presenting problem.  CLINICAL DECISION MAKING: LOW - limited treatment options, no task modification necessary  REHAB POTENTIAL: Good  EVALUATION COMPLEXITY: Low      PLAN:  OT FREQUENCY: one time visit (in outpatient setting)   OT DURATION:  1 sessions  PLANNED INTERVENTIONS: self care/ADL training, therapeutic exercise, therapeutic activity, splinting, patient/family education, energy conservation, and coping strategies training  RECOMMENDED OTHER SERVICES: They were recommended to have f/u with home health OT for functional ability and hand/arm use, motivation, self-care skills, etc.   CONSULTED AND AGREED  WITH PLAN OF CARE: Patient and family member/caregiver  PLAN FOR NEXT SESSION:  N/A they should follow-up with home health therapy   Fannie Knee, OTR/L, CHT 08/17/2023, 6:00 PM

## 2023-08-16 DIAGNOSIS — M810 Age-related osteoporosis without current pathological fracture: Secondary | ICD-10-CM | POA: Diagnosis not present

## 2023-08-16 DIAGNOSIS — F411 Generalized anxiety disorder: Secondary | ICD-10-CM | POA: Diagnosis not present

## 2023-08-16 DIAGNOSIS — I739 Peripheral vascular disease, unspecified: Secondary | ICD-10-CM | POA: Diagnosis not present

## 2023-08-16 DIAGNOSIS — Z993 Dependence on wheelchair: Secondary | ICD-10-CM | POA: Diagnosis not present

## 2023-08-16 DIAGNOSIS — I4510 Unspecified right bundle-branch block: Secondary | ICD-10-CM | POA: Diagnosis not present

## 2023-08-16 DIAGNOSIS — L97811 Non-pressure chronic ulcer of other part of right lower leg limited to breakdown of skin: Secondary | ICD-10-CM | POA: Diagnosis not present

## 2023-08-16 DIAGNOSIS — M47812 Spondylosis without myelopathy or radiculopathy, cervical region: Secondary | ICD-10-CM | POA: Diagnosis not present

## 2023-08-16 DIAGNOSIS — Z602 Problems related to living alone: Secondary | ICD-10-CM | POA: Diagnosis not present

## 2023-08-16 DIAGNOSIS — M519 Unspecified thoracic, thoracolumbar and lumbosacral intervertebral disc disorder: Secondary | ICD-10-CM | POA: Diagnosis not present

## 2023-08-16 DIAGNOSIS — Z853 Personal history of malignant neoplasm of breast: Secondary | ICD-10-CM | POA: Diagnosis not present

## 2023-08-16 DIAGNOSIS — I872 Venous insufficiency (chronic) (peripheral): Secondary | ICD-10-CM | POA: Diagnosis not present

## 2023-08-16 DIAGNOSIS — K2950 Unspecified chronic gastritis without bleeding: Secondary | ICD-10-CM | POA: Diagnosis not present

## 2023-08-16 DIAGNOSIS — G894 Chronic pain syndrome: Secondary | ICD-10-CM | POA: Diagnosis not present

## 2023-08-16 DIAGNOSIS — K449 Diaphragmatic hernia without obstruction or gangrene: Secondary | ICD-10-CM | POA: Diagnosis not present

## 2023-08-16 DIAGNOSIS — I5032 Chronic diastolic (congestive) heart failure: Secondary | ICD-10-CM | POA: Diagnosis not present

## 2023-08-16 DIAGNOSIS — I110 Hypertensive heart disease with heart failure: Secondary | ICD-10-CM | POA: Diagnosis not present

## 2023-08-16 DIAGNOSIS — F0394 Unspecified dementia, unspecified severity, with anxiety: Secondary | ICD-10-CM | POA: Diagnosis not present

## 2023-08-16 DIAGNOSIS — Z556 Problems related to health literacy: Secondary | ICD-10-CM | POA: Diagnosis not present

## 2023-08-17 ENCOUNTER — Other Ambulatory Visit: Payer: Self-pay

## 2023-08-17 ENCOUNTER — Other Ambulatory Visit: Payer: Self-pay | Admitting: Family

## 2023-08-17 ENCOUNTER — Encounter: Payer: Self-pay | Admitting: Rehabilitative and Restorative Service Providers"

## 2023-08-17 ENCOUNTER — Ambulatory Visit: Payer: Medicare Other | Admitting: Rehabilitative and Restorative Service Providers"

## 2023-08-17 DIAGNOSIS — M810 Age-related osteoporosis without current pathological fracture: Secondary | ICD-10-CM | POA: Diagnosis not present

## 2023-08-17 DIAGNOSIS — F411 Generalized anxiety disorder: Secondary | ICD-10-CM | POA: Diagnosis not present

## 2023-08-17 DIAGNOSIS — K2950 Unspecified chronic gastritis without bleeding: Secondary | ICD-10-CM | POA: Diagnosis not present

## 2023-08-17 DIAGNOSIS — M25641 Stiffness of right hand, not elsewhere classified: Secondary | ICD-10-CM

## 2023-08-17 DIAGNOSIS — I110 Hypertensive heart disease with heart failure: Secondary | ICD-10-CM | POA: Diagnosis not present

## 2023-08-17 DIAGNOSIS — M47812 Spondylosis without myelopathy or radiculopathy, cervical region: Secondary | ICD-10-CM | POA: Diagnosis not present

## 2023-08-17 DIAGNOSIS — I739 Peripheral vascular disease, unspecified: Secondary | ICD-10-CM | POA: Diagnosis not present

## 2023-08-17 DIAGNOSIS — G894 Chronic pain syndrome: Secondary | ICD-10-CM | POA: Diagnosis not present

## 2023-08-17 DIAGNOSIS — M6281 Muscle weakness (generalized): Secondary | ICD-10-CM | POA: Diagnosis not present

## 2023-08-17 DIAGNOSIS — Z853 Personal history of malignant neoplasm of breast: Secondary | ICD-10-CM | POA: Diagnosis not present

## 2023-08-17 DIAGNOSIS — M79641 Pain in right hand: Secondary | ICD-10-CM

## 2023-08-17 DIAGNOSIS — I4510 Unspecified right bundle-branch block: Secondary | ICD-10-CM | POA: Diagnosis not present

## 2023-08-17 DIAGNOSIS — L97811 Non-pressure chronic ulcer of other part of right lower leg limited to breakdown of skin: Secondary | ICD-10-CM | POA: Diagnosis not present

## 2023-08-17 DIAGNOSIS — I5032 Chronic diastolic (congestive) heart failure: Secondary | ICD-10-CM | POA: Diagnosis not present

## 2023-08-17 DIAGNOSIS — R278 Other lack of coordination: Secondary | ICD-10-CM

## 2023-08-17 DIAGNOSIS — Z556 Problems related to health literacy: Secondary | ICD-10-CM | POA: Diagnosis not present

## 2023-08-17 DIAGNOSIS — I872 Venous insufficiency (chronic) (peripheral): Secondary | ICD-10-CM | POA: Diagnosis not present

## 2023-08-17 DIAGNOSIS — F0394 Unspecified dementia, unspecified severity, with anxiety: Secondary | ICD-10-CM | POA: Diagnosis not present

## 2023-08-17 DIAGNOSIS — M519 Unspecified thoracic, thoracolumbar and lumbosacral intervertebral disc disorder: Secondary | ICD-10-CM | POA: Diagnosis not present

## 2023-08-17 DIAGNOSIS — K449 Diaphragmatic hernia without obstruction or gangrene: Secondary | ICD-10-CM | POA: Diagnosis not present

## 2023-08-17 NOTE — Progress Notes (Signed)
Out patient Occupational Therapy Heather [name] recommend patient to have a HH OT service instead of outpatient.request order to be send to [facility].OT has discussed referral with Patient's son. HH OT ordered.

## 2023-08-18 DIAGNOSIS — I739 Peripheral vascular disease, unspecified: Secondary | ICD-10-CM | POA: Diagnosis not present

## 2023-08-18 DIAGNOSIS — I872 Venous insufficiency (chronic) (peripheral): Secondary | ICD-10-CM | POA: Diagnosis not present

## 2023-08-18 DIAGNOSIS — K449 Diaphragmatic hernia without obstruction or gangrene: Secondary | ICD-10-CM | POA: Diagnosis not present

## 2023-08-18 DIAGNOSIS — I110 Hypertensive heart disease with heart failure: Secondary | ICD-10-CM | POA: Diagnosis not present

## 2023-08-18 DIAGNOSIS — I5032 Chronic diastolic (congestive) heart failure: Secondary | ICD-10-CM | POA: Diagnosis not present

## 2023-08-18 DIAGNOSIS — M47812 Spondylosis without myelopathy or radiculopathy, cervical region: Secondary | ICD-10-CM | POA: Diagnosis not present

## 2023-08-18 DIAGNOSIS — I4510 Unspecified right bundle-branch block: Secondary | ICD-10-CM | POA: Diagnosis not present

## 2023-08-18 DIAGNOSIS — M519 Unspecified thoracic, thoracolumbar and lumbosacral intervertebral disc disorder: Secondary | ICD-10-CM | POA: Diagnosis not present

## 2023-08-18 DIAGNOSIS — M810 Age-related osteoporosis without current pathological fracture: Secondary | ICD-10-CM | POA: Diagnosis not present

## 2023-08-18 DIAGNOSIS — K2950 Unspecified chronic gastritis without bleeding: Secondary | ICD-10-CM | POA: Diagnosis not present

## 2023-08-18 DIAGNOSIS — Z853 Personal history of malignant neoplasm of breast: Secondary | ICD-10-CM | POA: Diagnosis not present

## 2023-08-18 DIAGNOSIS — Z556 Problems related to health literacy: Secondary | ICD-10-CM | POA: Diagnosis not present

## 2023-08-18 DIAGNOSIS — L97811 Non-pressure chronic ulcer of other part of right lower leg limited to breakdown of skin: Secondary | ICD-10-CM | POA: Diagnosis not present

## 2023-08-18 DIAGNOSIS — G894 Chronic pain syndrome: Secondary | ICD-10-CM | POA: Diagnosis not present

## 2023-08-18 DIAGNOSIS — F411 Generalized anxiety disorder: Secondary | ICD-10-CM | POA: Diagnosis not present

## 2023-08-18 DIAGNOSIS — F0394 Unspecified dementia, unspecified severity, with anxiety: Secondary | ICD-10-CM | POA: Diagnosis not present

## 2023-08-20 DIAGNOSIS — F411 Generalized anxiety disorder: Secondary | ICD-10-CM | POA: Diagnosis not present

## 2023-08-20 DIAGNOSIS — Z556 Problems related to health literacy: Secondary | ICD-10-CM | POA: Diagnosis not present

## 2023-08-20 DIAGNOSIS — K2950 Unspecified chronic gastritis without bleeding: Secondary | ICD-10-CM | POA: Diagnosis not present

## 2023-08-20 DIAGNOSIS — Z853 Personal history of malignant neoplasm of breast: Secondary | ICD-10-CM | POA: Diagnosis not present

## 2023-08-20 DIAGNOSIS — M519 Unspecified thoracic, thoracolumbar and lumbosacral intervertebral disc disorder: Secondary | ICD-10-CM | POA: Diagnosis not present

## 2023-08-20 DIAGNOSIS — K449 Diaphragmatic hernia without obstruction or gangrene: Secondary | ICD-10-CM | POA: Diagnosis not present

## 2023-08-20 DIAGNOSIS — I5032 Chronic diastolic (congestive) heart failure: Secondary | ICD-10-CM | POA: Diagnosis not present

## 2023-08-20 DIAGNOSIS — F0394 Unspecified dementia, unspecified severity, with anxiety: Secondary | ICD-10-CM | POA: Diagnosis not present

## 2023-08-20 DIAGNOSIS — I4510 Unspecified right bundle-branch block: Secondary | ICD-10-CM | POA: Diagnosis not present

## 2023-08-20 DIAGNOSIS — G894 Chronic pain syndrome: Secondary | ICD-10-CM | POA: Diagnosis not present

## 2023-08-20 DIAGNOSIS — I739 Peripheral vascular disease, unspecified: Secondary | ICD-10-CM | POA: Diagnosis not present

## 2023-08-20 DIAGNOSIS — L97811 Non-pressure chronic ulcer of other part of right lower leg limited to breakdown of skin: Secondary | ICD-10-CM | POA: Diagnosis not present

## 2023-08-20 DIAGNOSIS — I110 Hypertensive heart disease with heart failure: Secondary | ICD-10-CM | POA: Diagnosis not present

## 2023-08-20 DIAGNOSIS — M810 Age-related osteoporosis without current pathological fracture: Secondary | ICD-10-CM | POA: Diagnosis not present

## 2023-08-20 DIAGNOSIS — M47812 Spondylosis without myelopathy or radiculopathy, cervical region: Secondary | ICD-10-CM | POA: Diagnosis not present

## 2023-08-20 DIAGNOSIS — I872 Venous insufficiency (chronic) (peripheral): Secondary | ICD-10-CM | POA: Diagnosis not present

## 2023-08-24 DIAGNOSIS — K449 Diaphragmatic hernia without obstruction or gangrene: Secondary | ICD-10-CM | POA: Diagnosis not present

## 2023-08-24 DIAGNOSIS — I4510 Unspecified right bundle-branch block: Secondary | ICD-10-CM | POA: Diagnosis not present

## 2023-08-24 DIAGNOSIS — M47812 Spondylosis without myelopathy or radiculopathy, cervical region: Secondary | ICD-10-CM | POA: Diagnosis not present

## 2023-08-24 DIAGNOSIS — K2950 Unspecified chronic gastritis without bleeding: Secondary | ICD-10-CM | POA: Diagnosis not present

## 2023-08-24 DIAGNOSIS — F0394 Unspecified dementia, unspecified severity, with anxiety: Secondary | ICD-10-CM | POA: Diagnosis not present

## 2023-08-24 DIAGNOSIS — I739 Peripheral vascular disease, unspecified: Secondary | ICD-10-CM | POA: Diagnosis not present

## 2023-08-24 DIAGNOSIS — L97811 Non-pressure chronic ulcer of other part of right lower leg limited to breakdown of skin: Secondary | ICD-10-CM | POA: Diagnosis not present

## 2023-08-24 DIAGNOSIS — Z853 Personal history of malignant neoplasm of breast: Secondary | ICD-10-CM | POA: Diagnosis not present

## 2023-08-24 DIAGNOSIS — I872 Venous insufficiency (chronic) (peripheral): Secondary | ICD-10-CM | POA: Diagnosis not present

## 2023-08-24 DIAGNOSIS — I5032 Chronic diastolic (congestive) heart failure: Secondary | ICD-10-CM | POA: Diagnosis not present

## 2023-08-24 DIAGNOSIS — M519 Unspecified thoracic, thoracolumbar and lumbosacral intervertebral disc disorder: Secondary | ICD-10-CM | POA: Diagnosis not present

## 2023-08-24 DIAGNOSIS — Z556 Problems related to health literacy: Secondary | ICD-10-CM | POA: Diagnosis not present

## 2023-08-24 DIAGNOSIS — M810 Age-related osteoporosis without current pathological fracture: Secondary | ICD-10-CM | POA: Diagnosis not present

## 2023-08-24 DIAGNOSIS — F411 Generalized anxiety disorder: Secondary | ICD-10-CM | POA: Diagnosis not present

## 2023-08-24 DIAGNOSIS — I110 Hypertensive heart disease with heart failure: Secondary | ICD-10-CM | POA: Diagnosis not present

## 2023-08-24 DIAGNOSIS — G894 Chronic pain syndrome: Secondary | ICD-10-CM | POA: Diagnosis not present

## 2023-08-25 DIAGNOSIS — F411 Generalized anxiety disorder: Secondary | ICD-10-CM | POA: Diagnosis not present

## 2023-08-25 DIAGNOSIS — K449 Diaphragmatic hernia without obstruction or gangrene: Secondary | ICD-10-CM | POA: Diagnosis not present

## 2023-08-25 DIAGNOSIS — Z556 Problems related to health literacy: Secondary | ICD-10-CM | POA: Diagnosis not present

## 2023-08-25 DIAGNOSIS — G894 Chronic pain syndrome: Secondary | ICD-10-CM | POA: Diagnosis not present

## 2023-08-25 DIAGNOSIS — K2950 Unspecified chronic gastritis without bleeding: Secondary | ICD-10-CM | POA: Diagnosis not present

## 2023-08-25 DIAGNOSIS — M519 Unspecified thoracic, thoracolumbar and lumbosacral intervertebral disc disorder: Secondary | ICD-10-CM | POA: Diagnosis not present

## 2023-08-25 DIAGNOSIS — Z853 Personal history of malignant neoplasm of breast: Secondary | ICD-10-CM | POA: Diagnosis not present

## 2023-08-25 DIAGNOSIS — M47812 Spondylosis without myelopathy or radiculopathy, cervical region: Secondary | ICD-10-CM | POA: Diagnosis not present

## 2023-08-25 DIAGNOSIS — F0394 Unspecified dementia, unspecified severity, with anxiety: Secondary | ICD-10-CM | POA: Diagnosis not present

## 2023-08-25 DIAGNOSIS — I110 Hypertensive heart disease with heart failure: Secondary | ICD-10-CM | POA: Diagnosis not present

## 2023-08-25 DIAGNOSIS — I5032 Chronic diastolic (congestive) heart failure: Secondary | ICD-10-CM | POA: Diagnosis not present

## 2023-08-25 DIAGNOSIS — I872 Venous insufficiency (chronic) (peripheral): Secondary | ICD-10-CM | POA: Diagnosis not present

## 2023-08-25 DIAGNOSIS — L97811 Non-pressure chronic ulcer of other part of right lower leg limited to breakdown of skin: Secondary | ICD-10-CM | POA: Diagnosis not present

## 2023-08-25 DIAGNOSIS — I739 Peripheral vascular disease, unspecified: Secondary | ICD-10-CM | POA: Diagnosis not present

## 2023-08-25 DIAGNOSIS — M810 Age-related osteoporosis without current pathological fracture: Secondary | ICD-10-CM | POA: Diagnosis not present

## 2023-08-25 DIAGNOSIS — I4510 Unspecified right bundle-branch block: Secondary | ICD-10-CM | POA: Diagnosis not present

## 2023-08-27 DIAGNOSIS — F0394 Unspecified dementia, unspecified severity, with anxiety: Secondary | ICD-10-CM | POA: Diagnosis not present

## 2023-08-27 DIAGNOSIS — G894 Chronic pain syndrome: Secondary | ICD-10-CM | POA: Diagnosis not present

## 2023-08-27 DIAGNOSIS — I5032 Chronic diastolic (congestive) heart failure: Secondary | ICD-10-CM | POA: Diagnosis not present

## 2023-08-27 DIAGNOSIS — Z556 Problems related to health literacy: Secondary | ICD-10-CM | POA: Diagnosis not present

## 2023-08-27 DIAGNOSIS — I739 Peripheral vascular disease, unspecified: Secondary | ICD-10-CM | POA: Diagnosis not present

## 2023-08-27 DIAGNOSIS — K449 Diaphragmatic hernia without obstruction or gangrene: Secondary | ICD-10-CM | POA: Diagnosis not present

## 2023-08-27 DIAGNOSIS — K2950 Unspecified chronic gastritis without bleeding: Secondary | ICD-10-CM | POA: Diagnosis not present

## 2023-08-27 DIAGNOSIS — M810 Age-related osteoporosis without current pathological fracture: Secondary | ICD-10-CM | POA: Diagnosis not present

## 2023-08-27 DIAGNOSIS — Z853 Personal history of malignant neoplasm of breast: Secondary | ICD-10-CM | POA: Diagnosis not present

## 2023-08-27 DIAGNOSIS — M47812 Spondylosis without myelopathy or radiculopathy, cervical region: Secondary | ICD-10-CM | POA: Diagnosis not present

## 2023-08-27 DIAGNOSIS — I110 Hypertensive heart disease with heart failure: Secondary | ICD-10-CM | POA: Diagnosis not present

## 2023-08-27 DIAGNOSIS — L97811 Non-pressure chronic ulcer of other part of right lower leg limited to breakdown of skin: Secondary | ICD-10-CM | POA: Diagnosis not present

## 2023-08-27 DIAGNOSIS — F411 Generalized anxiety disorder: Secondary | ICD-10-CM | POA: Diagnosis not present

## 2023-08-27 DIAGNOSIS — I4510 Unspecified right bundle-branch block: Secondary | ICD-10-CM | POA: Diagnosis not present

## 2023-08-27 DIAGNOSIS — I872 Venous insufficiency (chronic) (peripheral): Secondary | ICD-10-CM | POA: Diagnosis not present

## 2023-08-27 DIAGNOSIS — M519 Unspecified thoracic, thoracolumbar and lumbosacral intervertebral disc disorder: Secondary | ICD-10-CM | POA: Diagnosis not present

## 2023-09-01 DIAGNOSIS — K449 Diaphragmatic hernia without obstruction or gangrene: Secondary | ICD-10-CM | POA: Diagnosis not present

## 2023-09-01 DIAGNOSIS — Z556 Problems related to health literacy: Secondary | ICD-10-CM | POA: Diagnosis not present

## 2023-09-01 DIAGNOSIS — F411 Generalized anxiety disorder: Secondary | ICD-10-CM | POA: Diagnosis not present

## 2023-09-01 DIAGNOSIS — M810 Age-related osteoporosis without current pathological fracture: Secondary | ICD-10-CM | POA: Diagnosis not present

## 2023-09-01 DIAGNOSIS — M47812 Spondylosis without myelopathy or radiculopathy, cervical region: Secondary | ICD-10-CM | POA: Diagnosis not present

## 2023-09-01 DIAGNOSIS — I5032 Chronic diastolic (congestive) heart failure: Secondary | ICD-10-CM | POA: Diagnosis not present

## 2023-09-01 DIAGNOSIS — Z853 Personal history of malignant neoplasm of breast: Secondary | ICD-10-CM | POA: Diagnosis not present

## 2023-09-01 DIAGNOSIS — I739 Peripheral vascular disease, unspecified: Secondary | ICD-10-CM | POA: Diagnosis not present

## 2023-09-01 DIAGNOSIS — G894 Chronic pain syndrome: Secondary | ICD-10-CM | POA: Diagnosis not present

## 2023-09-01 DIAGNOSIS — M519 Unspecified thoracic, thoracolumbar and lumbosacral intervertebral disc disorder: Secondary | ICD-10-CM | POA: Diagnosis not present

## 2023-09-01 DIAGNOSIS — I110 Hypertensive heart disease with heart failure: Secondary | ICD-10-CM | POA: Diagnosis not present

## 2023-09-01 DIAGNOSIS — L97811 Non-pressure chronic ulcer of other part of right lower leg limited to breakdown of skin: Secondary | ICD-10-CM | POA: Diagnosis not present

## 2023-09-01 DIAGNOSIS — K2950 Unspecified chronic gastritis without bleeding: Secondary | ICD-10-CM | POA: Diagnosis not present

## 2023-09-01 DIAGNOSIS — I4510 Unspecified right bundle-branch block: Secondary | ICD-10-CM | POA: Diagnosis not present

## 2023-09-01 DIAGNOSIS — F0394 Unspecified dementia, unspecified severity, with anxiety: Secondary | ICD-10-CM | POA: Diagnosis not present

## 2023-09-01 DIAGNOSIS — I872 Venous insufficiency (chronic) (peripheral): Secondary | ICD-10-CM | POA: Diagnosis not present

## 2023-09-02 DIAGNOSIS — F0394 Unspecified dementia, unspecified severity, with anxiety: Secondary | ICD-10-CM | POA: Diagnosis not present

## 2023-09-02 DIAGNOSIS — L97811 Non-pressure chronic ulcer of other part of right lower leg limited to breakdown of skin: Secondary | ICD-10-CM | POA: Diagnosis not present

## 2023-09-02 DIAGNOSIS — M47812 Spondylosis without myelopathy or radiculopathy, cervical region: Secondary | ICD-10-CM | POA: Diagnosis not present

## 2023-09-02 DIAGNOSIS — I4510 Unspecified right bundle-branch block: Secondary | ICD-10-CM | POA: Diagnosis not present

## 2023-09-02 DIAGNOSIS — K2950 Unspecified chronic gastritis without bleeding: Secondary | ICD-10-CM | POA: Diagnosis not present

## 2023-09-02 DIAGNOSIS — Z556 Problems related to health literacy: Secondary | ICD-10-CM | POA: Diagnosis not present

## 2023-09-02 DIAGNOSIS — I872 Venous insufficiency (chronic) (peripheral): Secondary | ICD-10-CM | POA: Diagnosis not present

## 2023-09-02 DIAGNOSIS — K449 Diaphragmatic hernia without obstruction or gangrene: Secondary | ICD-10-CM | POA: Diagnosis not present

## 2023-09-02 DIAGNOSIS — I110 Hypertensive heart disease with heart failure: Secondary | ICD-10-CM | POA: Diagnosis not present

## 2023-09-02 DIAGNOSIS — F411 Generalized anxiety disorder: Secondary | ICD-10-CM | POA: Diagnosis not present

## 2023-09-02 DIAGNOSIS — M519 Unspecified thoracic, thoracolumbar and lumbosacral intervertebral disc disorder: Secondary | ICD-10-CM | POA: Diagnosis not present

## 2023-09-02 DIAGNOSIS — M810 Age-related osteoporosis without current pathological fracture: Secondary | ICD-10-CM | POA: Diagnosis not present

## 2023-09-02 DIAGNOSIS — I739 Peripheral vascular disease, unspecified: Secondary | ICD-10-CM | POA: Diagnosis not present

## 2023-09-02 DIAGNOSIS — I5032 Chronic diastolic (congestive) heart failure: Secondary | ICD-10-CM | POA: Diagnosis not present

## 2023-09-02 DIAGNOSIS — Z853 Personal history of malignant neoplasm of breast: Secondary | ICD-10-CM | POA: Diagnosis not present

## 2023-09-02 DIAGNOSIS — G894 Chronic pain syndrome: Secondary | ICD-10-CM | POA: Diagnosis not present

## 2023-09-03 DIAGNOSIS — M47812 Spondylosis without myelopathy or radiculopathy, cervical region: Secondary | ICD-10-CM | POA: Diagnosis not present

## 2023-09-03 DIAGNOSIS — I872 Venous insufficiency (chronic) (peripheral): Secondary | ICD-10-CM | POA: Diagnosis not present

## 2023-09-03 DIAGNOSIS — L97811 Non-pressure chronic ulcer of other part of right lower leg limited to breakdown of skin: Secondary | ICD-10-CM | POA: Diagnosis not present

## 2023-09-03 DIAGNOSIS — G894 Chronic pain syndrome: Secondary | ICD-10-CM | POA: Diagnosis not present

## 2023-09-03 DIAGNOSIS — I4510 Unspecified right bundle-branch block: Secondary | ICD-10-CM | POA: Diagnosis not present

## 2023-09-03 DIAGNOSIS — I5032 Chronic diastolic (congestive) heart failure: Secondary | ICD-10-CM | POA: Diagnosis not present

## 2023-09-03 DIAGNOSIS — K449 Diaphragmatic hernia without obstruction or gangrene: Secondary | ICD-10-CM | POA: Diagnosis not present

## 2023-09-03 DIAGNOSIS — F411 Generalized anxiety disorder: Secondary | ICD-10-CM | POA: Diagnosis not present

## 2023-09-03 DIAGNOSIS — Z853 Personal history of malignant neoplasm of breast: Secondary | ICD-10-CM | POA: Diagnosis not present

## 2023-09-03 DIAGNOSIS — K2950 Unspecified chronic gastritis without bleeding: Secondary | ICD-10-CM | POA: Diagnosis not present

## 2023-09-03 DIAGNOSIS — I110 Hypertensive heart disease with heart failure: Secondary | ICD-10-CM | POA: Diagnosis not present

## 2023-09-03 DIAGNOSIS — F0394 Unspecified dementia, unspecified severity, with anxiety: Secondary | ICD-10-CM | POA: Diagnosis not present

## 2023-09-03 DIAGNOSIS — I739 Peripheral vascular disease, unspecified: Secondary | ICD-10-CM | POA: Diagnosis not present

## 2023-09-03 DIAGNOSIS — Z556 Problems related to health literacy: Secondary | ICD-10-CM | POA: Diagnosis not present

## 2023-09-03 DIAGNOSIS — M810 Age-related osteoporosis without current pathological fracture: Secondary | ICD-10-CM | POA: Diagnosis not present

## 2023-09-03 DIAGNOSIS — M519 Unspecified thoracic, thoracolumbar and lumbosacral intervertebral disc disorder: Secondary | ICD-10-CM | POA: Diagnosis not present

## 2023-09-04 DIAGNOSIS — I110 Hypertensive heart disease with heart failure: Secondary | ICD-10-CM | POA: Diagnosis not present

## 2023-09-04 DIAGNOSIS — I872 Venous insufficiency (chronic) (peripheral): Secondary | ICD-10-CM | POA: Diagnosis not present

## 2023-09-04 DIAGNOSIS — Z853 Personal history of malignant neoplasm of breast: Secondary | ICD-10-CM | POA: Diagnosis not present

## 2023-09-04 DIAGNOSIS — I4510 Unspecified right bundle-branch block: Secondary | ICD-10-CM | POA: Diagnosis not present

## 2023-09-04 DIAGNOSIS — G894 Chronic pain syndrome: Secondary | ICD-10-CM | POA: Diagnosis not present

## 2023-09-04 DIAGNOSIS — I739 Peripheral vascular disease, unspecified: Secondary | ICD-10-CM | POA: Diagnosis not present

## 2023-09-04 DIAGNOSIS — I5032 Chronic diastolic (congestive) heart failure: Secondary | ICD-10-CM | POA: Diagnosis not present

## 2023-09-04 DIAGNOSIS — K449 Diaphragmatic hernia without obstruction or gangrene: Secondary | ICD-10-CM | POA: Diagnosis not present

## 2023-09-04 DIAGNOSIS — M47812 Spondylosis without myelopathy or radiculopathy, cervical region: Secondary | ICD-10-CM | POA: Diagnosis not present

## 2023-09-04 DIAGNOSIS — F0394 Unspecified dementia, unspecified severity, with anxiety: Secondary | ICD-10-CM | POA: Diagnosis not present

## 2023-09-04 DIAGNOSIS — F411 Generalized anxiety disorder: Secondary | ICD-10-CM | POA: Diagnosis not present

## 2023-09-04 DIAGNOSIS — M810 Age-related osteoporosis without current pathological fracture: Secondary | ICD-10-CM | POA: Diagnosis not present

## 2023-09-04 DIAGNOSIS — M519 Unspecified thoracic, thoracolumbar and lumbosacral intervertebral disc disorder: Secondary | ICD-10-CM | POA: Diagnosis not present

## 2023-09-04 DIAGNOSIS — K2950 Unspecified chronic gastritis without bleeding: Secondary | ICD-10-CM | POA: Diagnosis not present

## 2023-09-04 DIAGNOSIS — L97811 Non-pressure chronic ulcer of other part of right lower leg limited to breakdown of skin: Secondary | ICD-10-CM | POA: Diagnosis not present

## 2023-09-04 DIAGNOSIS — Z556 Problems related to health literacy: Secondary | ICD-10-CM | POA: Diagnosis not present

## 2023-09-08 DIAGNOSIS — M519 Unspecified thoracic, thoracolumbar and lumbosacral intervertebral disc disorder: Secondary | ICD-10-CM | POA: Diagnosis not present

## 2023-09-08 DIAGNOSIS — L97811 Non-pressure chronic ulcer of other part of right lower leg limited to breakdown of skin: Secondary | ICD-10-CM | POA: Diagnosis not present

## 2023-09-08 DIAGNOSIS — F411 Generalized anxiety disorder: Secondary | ICD-10-CM | POA: Diagnosis not present

## 2023-09-08 DIAGNOSIS — F0394 Unspecified dementia, unspecified severity, with anxiety: Secondary | ICD-10-CM | POA: Diagnosis not present

## 2023-09-08 DIAGNOSIS — I5032 Chronic diastolic (congestive) heart failure: Secondary | ICD-10-CM | POA: Diagnosis not present

## 2023-09-08 DIAGNOSIS — K449 Diaphragmatic hernia without obstruction or gangrene: Secondary | ICD-10-CM | POA: Diagnosis not present

## 2023-09-08 DIAGNOSIS — I739 Peripheral vascular disease, unspecified: Secondary | ICD-10-CM | POA: Diagnosis not present

## 2023-09-08 DIAGNOSIS — M47812 Spondylosis without myelopathy or radiculopathy, cervical region: Secondary | ICD-10-CM | POA: Diagnosis not present

## 2023-09-08 DIAGNOSIS — I4510 Unspecified right bundle-branch block: Secondary | ICD-10-CM | POA: Diagnosis not present

## 2023-09-08 DIAGNOSIS — M810 Age-related osteoporosis without current pathological fracture: Secondary | ICD-10-CM | POA: Diagnosis not present

## 2023-09-08 DIAGNOSIS — G894 Chronic pain syndrome: Secondary | ICD-10-CM | POA: Diagnosis not present

## 2023-09-08 DIAGNOSIS — Z556 Problems related to health literacy: Secondary | ICD-10-CM | POA: Diagnosis not present

## 2023-09-08 DIAGNOSIS — I110 Hypertensive heart disease with heart failure: Secondary | ICD-10-CM | POA: Diagnosis not present

## 2023-09-08 DIAGNOSIS — K2950 Unspecified chronic gastritis without bleeding: Secondary | ICD-10-CM | POA: Diagnosis not present

## 2023-09-08 DIAGNOSIS — I872 Venous insufficiency (chronic) (peripheral): Secondary | ICD-10-CM | POA: Diagnosis not present

## 2023-09-08 DIAGNOSIS — Z853 Personal history of malignant neoplasm of breast: Secondary | ICD-10-CM | POA: Diagnosis not present

## 2023-09-09 DIAGNOSIS — G894 Chronic pain syndrome: Secondary | ICD-10-CM | POA: Diagnosis not present

## 2023-09-09 DIAGNOSIS — Z556 Problems related to health literacy: Secondary | ICD-10-CM | POA: Diagnosis not present

## 2023-09-09 DIAGNOSIS — M47812 Spondylosis without myelopathy or radiculopathy, cervical region: Secondary | ICD-10-CM | POA: Diagnosis not present

## 2023-09-09 DIAGNOSIS — F411 Generalized anxiety disorder: Secondary | ICD-10-CM | POA: Diagnosis not present

## 2023-09-09 DIAGNOSIS — Z853 Personal history of malignant neoplasm of breast: Secondary | ICD-10-CM | POA: Diagnosis not present

## 2023-09-09 DIAGNOSIS — I5032 Chronic diastolic (congestive) heart failure: Secondary | ICD-10-CM | POA: Diagnosis not present

## 2023-09-09 DIAGNOSIS — F0394 Unspecified dementia, unspecified severity, with anxiety: Secondary | ICD-10-CM | POA: Diagnosis not present

## 2023-09-09 DIAGNOSIS — M810 Age-related osteoporosis without current pathological fracture: Secondary | ICD-10-CM | POA: Diagnosis not present

## 2023-09-09 DIAGNOSIS — K2950 Unspecified chronic gastritis without bleeding: Secondary | ICD-10-CM | POA: Diagnosis not present

## 2023-09-09 DIAGNOSIS — M519 Unspecified thoracic, thoracolumbar and lumbosacral intervertebral disc disorder: Secondary | ICD-10-CM | POA: Diagnosis not present

## 2023-09-09 DIAGNOSIS — L97811 Non-pressure chronic ulcer of other part of right lower leg limited to breakdown of skin: Secondary | ICD-10-CM | POA: Diagnosis not present

## 2023-09-09 DIAGNOSIS — I110 Hypertensive heart disease with heart failure: Secondary | ICD-10-CM | POA: Diagnosis not present

## 2023-09-09 DIAGNOSIS — I739 Peripheral vascular disease, unspecified: Secondary | ICD-10-CM | POA: Diagnosis not present

## 2023-09-09 DIAGNOSIS — I4510 Unspecified right bundle-branch block: Secondary | ICD-10-CM | POA: Diagnosis not present

## 2023-09-09 DIAGNOSIS — K449 Diaphragmatic hernia without obstruction or gangrene: Secondary | ICD-10-CM | POA: Diagnosis not present

## 2023-09-09 DIAGNOSIS — I872 Venous insufficiency (chronic) (peripheral): Secondary | ICD-10-CM | POA: Diagnosis not present

## 2023-09-14 DIAGNOSIS — M810 Age-related osteoporosis without current pathological fracture: Secondary | ICD-10-CM | POA: Diagnosis not present

## 2023-09-14 DIAGNOSIS — M47812 Spondylosis without myelopathy or radiculopathy, cervical region: Secondary | ICD-10-CM | POA: Diagnosis not present

## 2023-09-14 DIAGNOSIS — I110 Hypertensive heart disease with heart failure: Secondary | ICD-10-CM | POA: Diagnosis not present

## 2023-09-14 DIAGNOSIS — Z556 Problems related to health literacy: Secondary | ICD-10-CM | POA: Diagnosis not present

## 2023-09-14 DIAGNOSIS — K449 Diaphragmatic hernia without obstruction or gangrene: Secondary | ICD-10-CM | POA: Diagnosis not present

## 2023-09-14 DIAGNOSIS — F411 Generalized anxiety disorder: Secondary | ICD-10-CM | POA: Diagnosis not present

## 2023-09-14 DIAGNOSIS — Z853 Personal history of malignant neoplasm of breast: Secondary | ICD-10-CM | POA: Diagnosis not present

## 2023-09-14 DIAGNOSIS — I739 Peripheral vascular disease, unspecified: Secondary | ICD-10-CM | POA: Diagnosis not present

## 2023-09-14 DIAGNOSIS — I5032 Chronic diastolic (congestive) heart failure: Secondary | ICD-10-CM | POA: Diagnosis not present

## 2023-09-14 DIAGNOSIS — G894 Chronic pain syndrome: Secondary | ICD-10-CM | POA: Diagnosis not present

## 2023-09-14 DIAGNOSIS — F0394 Unspecified dementia, unspecified severity, with anxiety: Secondary | ICD-10-CM | POA: Diagnosis not present

## 2023-09-14 DIAGNOSIS — I4510 Unspecified right bundle-branch block: Secondary | ICD-10-CM | POA: Diagnosis not present

## 2023-09-14 DIAGNOSIS — L97811 Non-pressure chronic ulcer of other part of right lower leg limited to breakdown of skin: Secondary | ICD-10-CM | POA: Diagnosis not present

## 2023-09-14 DIAGNOSIS — M519 Unspecified thoracic, thoracolumbar and lumbosacral intervertebral disc disorder: Secondary | ICD-10-CM | POA: Diagnosis not present

## 2023-09-14 DIAGNOSIS — I872 Venous insufficiency (chronic) (peripheral): Secondary | ICD-10-CM | POA: Diagnosis not present

## 2023-09-14 DIAGNOSIS — K2950 Unspecified chronic gastritis without bleeding: Secondary | ICD-10-CM | POA: Diagnosis not present

## 2023-09-15 ENCOUNTER — Encounter: Payer: Medicare Other | Admitting: Adult Health

## 2023-09-15 ENCOUNTER — Telehealth: Payer: Self-pay

## 2023-09-15 DIAGNOSIS — M47812 Spondylosis without myelopathy or radiculopathy, cervical region: Secondary | ICD-10-CM | POA: Diagnosis not present

## 2023-09-15 DIAGNOSIS — Z853 Personal history of malignant neoplasm of breast: Secondary | ICD-10-CM | POA: Diagnosis not present

## 2023-09-15 DIAGNOSIS — Z993 Dependence on wheelchair: Secondary | ICD-10-CM | POA: Diagnosis not present

## 2023-09-15 DIAGNOSIS — F0394 Unspecified dementia, unspecified severity, with anxiety: Secondary | ICD-10-CM | POA: Diagnosis not present

## 2023-09-15 DIAGNOSIS — G894 Chronic pain syndrome: Secondary | ICD-10-CM | POA: Diagnosis not present

## 2023-09-15 DIAGNOSIS — I5032 Chronic diastolic (congestive) heart failure: Secondary | ICD-10-CM | POA: Diagnosis not present

## 2023-09-15 DIAGNOSIS — M519 Unspecified thoracic, thoracolumbar and lumbosacral intervertebral disc disorder: Secondary | ICD-10-CM | POA: Diagnosis not present

## 2023-09-15 DIAGNOSIS — I110 Hypertensive heart disease with heart failure: Secondary | ICD-10-CM | POA: Diagnosis not present

## 2023-09-15 DIAGNOSIS — F411 Generalized anxiety disorder: Secondary | ICD-10-CM | POA: Diagnosis not present

## 2023-09-15 DIAGNOSIS — I872 Venous insufficiency (chronic) (peripheral): Secondary | ICD-10-CM | POA: Diagnosis not present

## 2023-09-15 DIAGNOSIS — I739 Peripheral vascular disease, unspecified: Secondary | ICD-10-CM | POA: Diagnosis not present

## 2023-09-15 DIAGNOSIS — L97811 Non-pressure chronic ulcer of other part of right lower leg limited to breakdown of skin: Secondary | ICD-10-CM | POA: Diagnosis not present

## 2023-09-15 DIAGNOSIS — M810 Age-related osteoporosis without current pathological fracture: Secondary | ICD-10-CM | POA: Diagnosis not present

## 2023-09-15 DIAGNOSIS — K449 Diaphragmatic hernia without obstruction or gangrene: Secondary | ICD-10-CM | POA: Diagnosis not present

## 2023-09-15 DIAGNOSIS — I4510 Unspecified right bundle-branch block: Secondary | ICD-10-CM | POA: Diagnosis not present

## 2023-09-15 DIAGNOSIS — Z556 Problems related to health literacy: Secondary | ICD-10-CM | POA: Diagnosis not present

## 2023-09-15 DIAGNOSIS — Z602 Problems related to living alone: Secondary | ICD-10-CM | POA: Diagnosis not present

## 2023-09-15 DIAGNOSIS — K2950 Unspecified chronic gastritis without bleeding: Secondary | ICD-10-CM | POA: Diagnosis not present

## 2023-09-15 NOTE — Telephone Encounter (Signed)
Medina-Vargas, Monina C, NP  You1 minute ago (12:50 PM)    Noted.

## 2023-09-15 NOTE — Telephone Encounter (Signed)
[facility] physical therapist called requesting verbal orders to extend home health once weekly x 4 weeks.   Per [employer] standing order, verbal order given. Message will be sent to patient's provider as a FYI.

## 2023-09-15 NOTE — Progress Notes (Signed)
This encounter was created in error - please disregard.

## 2023-09-18 ENCOUNTER — Encounter: Payer: Self-pay | Admitting: Family

## 2023-09-18 ENCOUNTER — Ambulatory Visit (INDEPENDENT_AMBULATORY_CARE_PROVIDER_SITE_OTHER): Payer: Medicare Other | Admitting: Family

## 2023-09-18 VITALS — BP 114/60 | HR 71 | Temp 97.8°F | Resp 16 | Ht <= 58 in | Wt 109.6 lb

## 2023-09-18 DIAGNOSIS — F0394 Unspecified dementia, unspecified severity, with anxiety: Secondary | ICD-10-CM | POA: Diagnosis not present

## 2023-09-18 DIAGNOSIS — M810 Age-related osteoporosis without current pathological fracture: Secondary | ICD-10-CM | POA: Diagnosis not present

## 2023-09-18 DIAGNOSIS — L97811 Non-pressure chronic ulcer of other part of right lower leg limited to breakdown of skin: Secondary | ICD-10-CM | POA: Diagnosis not present

## 2023-09-18 DIAGNOSIS — Z853 Personal history of malignant neoplasm of breast: Secondary | ICD-10-CM | POA: Diagnosis not present

## 2023-09-18 DIAGNOSIS — K2950 Unspecified chronic gastritis without bleeding: Secondary | ICD-10-CM | POA: Diagnosis not present

## 2023-09-18 DIAGNOSIS — I739 Peripheral vascular disease, unspecified: Secondary | ICD-10-CM | POA: Diagnosis not present

## 2023-09-18 DIAGNOSIS — N3001 Acute cystitis with hematuria: Secondary | ICD-10-CM

## 2023-09-18 DIAGNOSIS — K449 Diaphragmatic hernia without obstruction or gangrene: Secondary | ICD-10-CM | POA: Diagnosis not present

## 2023-09-18 DIAGNOSIS — I110 Hypertensive heart disease with heart failure: Secondary | ICD-10-CM | POA: Diagnosis not present

## 2023-09-18 DIAGNOSIS — G894 Chronic pain syndrome: Secondary | ICD-10-CM | POA: Diagnosis not present

## 2023-09-18 DIAGNOSIS — M47812 Spondylosis without myelopathy or radiculopathy, cervical region: Secondary | ICD-10-CM | POA: Diagnosis not present

## 2023-09-18 DIAGNOSIS — M519 Unspecified thoracic, thoracolumbar and lumbosacral intervertebral disc disorder: Secondary | ICD-10-CM | POA: Diagnosis not present

## 2023-09-18 DIAGNOSIS — I872 Venous insufficiency (chronic) (peripheral): Secondary | ICD-10-CM | POA: Diagnosis not present

## 2023-09-18 DIAGNOSIS — Z556 Problems related to health literacy: Secondary | ICD-10-CM | POA: Diagnosis not present

## 2023-09-18 DIAGNOSIS — I4510 Unspecified right bundle-branch block: Secondary | ICD-10-CM | POA: Diagnosis not present

## 2023-09-18 DIAGNOSIS — F411 Generalized anxiety disorder: Secondary | ICD-10-CM | POA: Diagnosis not present

## 2023-09-18 DIAGNOSIS — R5381 Other malaise: Secondary | ICD-10-CM

## 2023-09-18 DIAGNOSIS — I5032 Chronic diastolic (congestive) heart failure: Secondary | ICD-10-CM | POA: Diagnosis not present

## 2023-09-18 MED ORDER — NITROFURANTOIN MONOHYD MACRO 100 MG PO CAPS
100.0000 mg | ORAL_CAPSULE | Freq: Two times a day (BID) | ORAL | 0 refills | Status: AC
Start: 2023-09-18 — End: 2023-09-25

## 2023-09-18 NOTE — Progress Notes (Unsigned)
Provider: Richarda Blade FNP-C  Mckynlee Luse, Donalee Citrin, NP  Patient Care Team: Fawnda Vitullo, Donalee Citrin, NP as PCP - General (Family Medicine) Wendall Stade, MD as PCP - Cardiology (Cardiology) Gibson Ramp, MD as Referring Physician (Internal Medicine) Valeria Batman, MD (Inactive) as Attending Physician (Orthopedic Surgery) Olivia Canter, MD as Consulting Physician (Ophthalmology) Michele Mcalpine, MD as Consulting Physician (Pulmonary Disease)  Extended Emergency Contact Information Primary Emergency Contact: Zajkowski,Michael Address: [address]          [place], [place] [address] D[place] of [place] Home Phone: [phone] Mobile Phone: [phone] Relation: Son Secondary Emergency Contact: [facility] Phone: [phone] Relation: Other Preferred language: English Interpreter needed? No  Code Status:  Full Code  Goals of care: Advanced Directive information    09/18/2023    1:10 PM  Advanced Directives  Does Patient Have a Medical Advance Directive? No  Would patient like information on creating a medical advance directive? No - Patient declined     Chief Complaint  Patient presents with   Urinary Tract Infection    Possible UTI     HPI:  Pt is a 89 y.o. female seen today for an acute visit for evaluation of urine frequency for several days which seems to be worsening.Here with her son who provides HPI information. She denies any fever,chills,nausea,vomiting,abdominal pain,flank pain,urgency,dysuria,difficult urination or hematuria.   Past Medical History:  Diagnosis Date   Abdominal pain, left lower quadrant 06/22/2013   Abnormal CT scan of lung 07/16/2015   Allergic rhinitis, cause unspecified 02/21/2014   Anxiety state, unspecified    Breast cancer (HCC)    NO STICK OR BLOOD PRESSURE CHECKS IN LEFT ARM   Carpal tunnel syndrome    Carpal tunnel syndrome on both sides 10/03/2008   Qualifier: Diagnosis of  By: Kriste Basque MD, Lonzo Cloud    Cervical  spondylosis    Chronic pain syndrome 03/03/2019   Colon polyp 12/19/2009   Transverse-polypoid colorectal mucosa   Constipation    Cough 10/21/2014   Degenerative joint disease    Diastolic dysfunction    Diverticula of colon    Diverticulosis of colon 05/24/2002   Qualifier: Diagnosis of  By: Thereasa Solo     Gastritis, chronic    Hiatal hernia    Hypertension    Low back pain    LPRD (laryngopharyngeal reflux disease) 04/04/2014   Osteoporosis 05/22/2020   RBBB (right bundle branch block with left anterior fascicular block)    Renal cyst    Venous insufficiency    Past Surgical History:  Procedure Laterality Date   CARPAL TUNNEL RELEASE     INTRAMEDULLARY (IM) NAIL INTERTROCHANTERIC Right 11/13/2021   Procedure: INTRAMEDULLARY (IM) NAIL INTERTROCHANTRIC;  Surgeon: Huel Cote, MD;  Location: [facility];  Service: Orthopedics;  Laterality: Right;   Left mastectomy      Allergies  Allergen Reactions   Fentanyl Nausea Only and Other (See Comments)    Pt states that she is not allergic     Naprosyn [Naproxen] Other (See Comments)    Patient felt tongue and face swelling. Went to ED where no visible swelling noted    Outpatient Encounter Medications as of 09/18/2023  Medication Sig   acetaminophen (TYLENOL) 500 MG tablet Take 500 mg by mouth every 6 (six) hours as needed for moderate pain or headache.   carvedilol (COREG) 3.125 MG tablet TAKE 1 TABLET BY MOUTH TWICE DAILY. HOLD FOR SBP <110 OR IF HR <70   cholecalciferol (VITAMIN  D) 1000 units tablet Take 1,000 Units by mouth daily.   famotidine (PEPCID) 20 MG tablet Take 1 tablet (20 mg total) by mouth daily.   feeding supplement, ENSURE ENLIVE, (ENSURE ENLIVE) LIQD Take 237 mLs by mouth 2 (two) times daily between meals.   furosemide (LASIX) 20 MG tablet Take 0.5 tablets (10 mg total) by mouth daily.   lidocaine (LIDODERM) 5 % Place 1 patch onto the skin as needed. Remove & Discard patch within 12 hours or as directed by  MD   LORazepam (ATIVAN) 0.5 MG tablet Take 0.5 mg by mouth as needed.   Multiple Vitamins-Minerals (MULTIVITAMINS THER. W/MINERALS) TABS Take 1 tablet by mouth daily.   mupirocin ointment (BACTROBAN) 2 % Apply to top of left foot once daily every morning.   polyethylene glycol (MIRALAX / GLYCOLAX) 17 g packet Take 17 g by mouth daily.   Propylene Glycol (SYSTANE COMPLETE OP) Apply 1 drop to eye daily as needed (dry eyes).   sennosides-docusate sodium (SENOKOT-S) 8.6-50 MG tablet Take 1-2 tablets by mouth daily as needed for constipation.   spironolactone (ALDACTONE) 25 MG tablet Take 1 tablet (25 mg total) by mouth daily.   triamcinolone cream (KENALOG) 0.1 % Apply 1 Application topically 2 (two) times daily.   No facility-administered encounter medications on file as of 09/18/2023.    Review of Systems  Constitutional:  Negative for appetite change, chills, fatigue, fever and unexpected weight change.  HENT:  Positive for hearing loss. Negative for congestion, dental problem, ear discharge, ear pain, facial swelling, nosebleeds, postnasal drip, rhinorrhea, sinus pressure, sinus pain, sneezing, sore throat, tinnitus and trouble swallowing.   Eyes:  Negative for pain, discharge, redness, itching and visual disturbance.  Respiratory:  Negative for cough, chest tightness, shortness of breath and wheezing.   Cardiovascular:  Negative for chest pain, palpitations and leg swelling.  Gastrointestinal:  Negative for abdominal distention, abdominal pain, blood in stool, constipation, diarrhea, nausea and vomiting.  Genitourinary:  Positive for frequency. Negative for difficulty urinating, dysuria, flank pain and urgency.       Incontinent   Musculoskeletal:  Positive for arthralgias and gait problem. Negative for back pain, joint swelling and myalgias.  Skin:  Negative for color change, pallor, rash and wound.  Neurological:  Negative for dizziness, syncope, weakness, light-headedness and headaches.   Psychiatric/Behavioral:  Negative for agitation, behavioral problems, confusion, hallucinations and sleep disturbance. The patient is not nervous/anxious.     Immunization History  Administered Date(s) Administered   Fluad Quad(high Dose 65+) 08/19/2021   Fluad Trivalent(High Dose 65+) 08/07/2023   Influenza, High Dose Seasonal PF 08/21/2015   Influenza,inj,Quad PF,6+ Mos 09/18/2015   PFIZER(Purple Top)SARS-COV-2 Vaccination 04/18/2020, 05/10/2020   Pneumococcal Polysaccharide-23 02/07/2015   Pertinent  Health Maintenance Due  Topic Date Due   INFLUENZA VACCINE  Completed   DEXA SCAN  Completed      08/21/2022    1:56 PM 12/05/2022    3:50 PM 07/24/2023    2:03 PM 08/07/2023   11:09 AM 09/18/2023    1:08 PM  Fall Risk  Falls in the past year? 0 1 0 1 0  Was there an injury with Fall? 0 1  1 0  Fall Risk Category Calculator 0 3  2 0  Fall Risk Category (Retired) Low      (RETIRED) Patient Fall Risk Level Low fall risk      Patient at Risk for Falls Due to No Fall Risks History of fall(s);Impaired balance/gait;Impaired mobility  History  of fall(s);Impaired balance/gait;Impaired mobility No Fall Risks  Fall risk Follow up Falls evaluation completed Falls evaluation completed;Education provided;Falls prevention discussed  Falls evaluation completed    Functional Status Survey:    Vitals:   09/18/23 1312  BP: 114/60  Pulse: 71  Resp: 16  Temp: 97.8 F (36.6 C)  SpO2: 96%  Weight: 109 lb 9.6 oz (49.7 kg)  Height: 4\' 10"  (1.473 m)   Body mass index is 22.91 kg/m. Physical Exam Vitals reviewed.  Constitutional:      General: She is not in acute distress.    Appearance: Normal appearance. She is normal weight. She is not ill-appearing or diaphoretic.  HENT:     Head: Normocephalic.     Mouth/Throat:     Mouth: Mucous membranes are moist.     Pharynx: Oropharynx is clear. No oropharyngeal exudate or posterior oropharyngeal erythema.  Eyes:     General: No scleral  icterus.       Right eye: No discharge.        Left eye: No discharge.     Conjunctiva/sclera: Conjunctivae normal.     Pupils: Pupils are equal, round, and reactive to light.     Comments: Corrective lens in place   Neck:     Vascular: No carotid bruit.  Cardiovascular:     Rate and Rhythm: Normal rate and regular rhythm.     Pulses: Normal pulses.     Heart sounds: Normal heart sounds. No murmur heard.    No friction rub. No gallop.  Pulmonary:     Effort: Pulmonary effort is normal. No respiratory distress.     Breath sounds: Normal breath sounds. No wheezing, rhonchi or rales.  Chest:     Chest wall: No tenderness.  Abdominal:     General: Bowel sounds are normal. There is no distension.     Palpations: Abdomen is soft. There is no mass.     Tenderness: There is abdominal tenderness in the suprapubic area. There is no right CVA tenderness, left CVA tenderness, guarding or rebound.  Musculoskeletal:        General: No swelling or tenderness. Normal range of motion.     Cervical back: Normal range of motion. No rigidity or tenderness.     Right lower leg: No edema.     Left lower leg: No edema.  Lymphadenopathy:     Cervical: No cervical adenopathy.  Skin:    General: Skin is warm and dry.     Coloration: Skin is not pale.     Findings: No erythema or rash.     Comments: Sacral area whitish scab area without any erythema or drainage.Non-tender to palpation.   Neurological:     Mental Status: She is alert. Mental status is at baseline.     Sensory: No sensory deficit.     Motor: No weakness.     Gait: Gait abnormal.  Psychiatric:        Mood and Affect: Mood normal.        Speech: Speech normal.        Behavior: Behavior normal.     Labs reviewed: Recent Labs    06/08/23 1133  NA 142  K 4.7  CL 105  CO2 31  GLUCOSE 88  BUN 26*  CREATININE 0.98*  CALCIUM 9.5   Recent Labs    06/08/23 1133  AST 23  ALT 18  BILITOT 0.5  PROT 6.3   Recent Labs     06/08/23 1133  WBC 5.5  NEUTROABS 3,465  HGB 12.5  HCT 38.6  MCV 93.5  PLT 239   Lab Results  Component Value Date   TSH 2.55 06/08/2023   No results found for: "HGBA1C" Lab Results  Component Value Date   CHOL 174 06/08/2023   HDL 73 06/08/2023   LDLCALC 89 06/08/2023   TRIG 42 06/08/2023   CHOLHDL 2.4 06/08/2023    Significant Diagnostic Results in last 30 days:  No results found.  Assessment/Plan  1. Acute cystitis with hematuria Afebrile  Urine dip indicates moderate blood,positive leukocytes and Nitrates.  Suprapubic tenderness on exam -Encouraged to increase fluid intake -Will start on nitrofurantoin as below.  Made aware might need to switch antibiotics if culture not sensitive - nitrofurantoin, macrocrystal-monohydrate, (MACROBID) 100 MG capsule; Take 1 capsule (100 mg total) by mouth 2 (two) times daily for 7 days.  Dispense: 14 capsule; Refill: 0 - Urine Culture  2. Physical debility Progress decline has had additional weight loss - discussed hospice care with son.States previous on Hospice in the past.  - continue with supportive care  - Ambulatory referral to Hospice  Family/ staff Communication: Reviewed plan of care with patient and son verbalized understanding  Labs/tests ordered:  Urine dip stick  - Urine Culture  Next Appointment: Return if symptoms worsen or fail to improve.   Caesar Bookman, NP

## 2023-09-20 LAB — URINE CULTURE
MICRO NUMBER:: 15676548
SPECIMEN QUALITY:: ADEQUATE

## 2023-09-21 DIAGNOSIS — M810 Age-related osteoporosis without current pathological fracture: Secondary | ICD-10-CM | POA: Diagnosis not present

## 2023-09-21 DIAGNOSIS — Z853 Personal history of malignant neoplasm of breast: Secondary | ICD-10-CM | POA: Diagnosis not present

## 2023-09-21 DIAGNOSIS — F411 Generalized anxiety disorder: Secondary | ICD-10-CM | POA: Diagnosis not present

## 2023-09-21 DIAGNOSIS — M519 Unspecified thoracic, thoracolumbar and lumbosacral intervertebral disc disorder: Secondary | ICD-10-CM | POA: Diagnosis not present

## 2023-09-21 DIAGNOSIS — M47812 Spondylosis without myelopathy or radiculopathy, cervical region: Secondary | ICD-10-CM | POA: Diagnosis not present

## 2023-09-21 DIAGNOSIS — I739 Peripheral vascular disease, unspecified: Secondary | ICD-10-CM | POA: Diagnosis not present

## 2023-09-21 DIAGNOSIS — I872 Venous insufficiency (chronic) (peripheral): Secondary | ICD-10-CM | POA: Diagnosis not present

## 2023-09-21 DIAGNOSIS — L97811 Non-pressure chronic ulcer of other part of right lower leg limited to breakdown of skin: Secondary | ICD-10-CM | POA: Diagnosis not present

## 2023-09-21 DIAGNOSIS — F0394 Unspecified dementia, unspecified severity, with anxiety: Secondary | ICD-10-CM | POA: Diagnosis not present

## 2023-09-21 DIAGNOSIS — G894 Chronic pain syndrome: Secondary | ICD-10-CM | POA: Diagnosis not present

## 2023-09-21 DIAGNOSIS — Z556 Problems related to health literacy: Secondary | ICD-10-CM | POA: Diagnosis not present

## 2023-09-21 DIAGNOSIS — I5032 Chronic diastolic (congestive) heart failure: Secondary | ICD-10-CM | POA: Diagnosis not present

## 2023-09-21 DIAGNOSIS — I110 Hypertensive heart disease with heart failure: Secondary | ICD-10-CM | POA: Diagnosis not present

## 2023-09-21 DIAGNOSIS — K449 Diaphragmatic hernia without obstruction or gangrene: Secondary | ICD-10-CM | POA: Diagnosis not present

## 2023-09-21 DIAGNOSIS — I4510 Unspecified right bundle-branch block: Secondary | ICD-10-CM | POA: Diagnosis not present

## 2023-09-21 DIAGNOSIS — K2950 Unspecified chronic gastritis without bleeding: Secondary | ICD-10-CM | POA: Diagnosis not present

## 2023-09-22 ENCOUNTER — Ambulatory Visit: Payer: Medicare Other | Admitting: Podiatry

## 2023-09-23 ENCOUNTER — Other Ambulatory Visit: Payer: Self-pay

## 2023-09-23 MED ORDER — SACCHAROMYCES BOULARDII 250 MG PO CAPS: 250.0000 mg | ORAL_CAPSULE | Freq: Two times a day (BID) | ORAL | 0 refills | Status: AC

## 2023-09-23 MED ORDER — CIPROFLOXACIN HCL 500 MG PO TABS: 500.0000 mg | ORAL_TABLET | Freq: Two times a day (BID) | ORAL | 0 refills | Status: AC

## 2023-09-28 LAB — POCT URINALYSIS DIPSTICK (MANUAL)
Nitrite, UA: POSITIVE — AB
Poct Bilirubin: NEGATIVE
Poct Glucose: NORMAL mg/dL
Poct Ketones: NEGATIVE
Poct Urobilinogen: NORMAL mg/dL
Spec Grav, UA: 1.01 (ref 1.010–1.025)
pH, UA: 7.5 (ref 5.0–8.0)

## 2023-10-07 ENCOUNTER — Encounter: Payer: Self-pay | Admitting: Podiatry

## 2023-10-07 ENCOUNTER — Ambulatory Visit: Payer: Medicare Other | Admitting: Podiatry

## 2023-10-07 VITALS — Ht <= 58 in | Wt 109.6 lb

## 2023-10-07 DIAGNOSIS — M79674 Pain in right toe(s): Secondary | ICD-10-CM

## 2023-10-07 DIAGNOSIS — B351 Tinea unguium: Secondary | ICD-10-CM | POA: Diagnosis not present

## 2023-10-07 DIAGNOSIS — M79675 Pain in left toe(s): Secondary | ICD-10-CM

## 2023-10-13 ENCOUNTER — Ambulatory Visit: Payer: Medicare Other | Admitting: Podiatry

## 2023-10-14 NOTE — Progress Notes (Signed)
  Subjective:  Patient ID: Heather [name], female    DOB: December 19, 1917,  MRN: [id number]  Heather [name] presents to clinic today for painful elongated mycotic toenails 1-5 bilaterally which are tender when wearing enclosed shoe gear. Pain is relieved with periodic professional debridement.  Chief Complaint  Patient presents with   Nail Problem    Pt is here for RFC   New problem(s): None.   PCP is Ngetich, Donalee Citrin, NP.  Allergies  Allergen Reactions   Fentanyl Nausea Only and Other (See Comments)    Pt states that she is not allergic     Naprosyn [Naproxen] Other (See Comments)    Patient felt tongue and face swelling. Went to ED where no visible swelling noted    Review of Systems: Negative except as noted in the HPI.  Objective: No changes noted in today's physical examination. There were no vitals filed for this visit. Heather [name] is a pleasant 89 y.o. female WD, WN in NAD. AAO x 3.  Vascular Examination: CFT <4 seconds b/l. DP pulses diminished b/l. PT pulses diminished b/l. Digital hair absent. Skin temperature gradient warm to warm b/l. No ischemia or gangrene. No cyanosis or clubbing noted b/l. Nonpitting edema noted BLE.   Neurological Examination: Sensation grossly intact b/l with 10 gram monofilament. Vibratory sensation intact b/l.   Dermatological Examination: Pedal skin thin, shiny and atrophic b/l. No open wounds. No interdigital macerations.   Toenails 1-5 b/l thick, discolored, elongated with subungual debris and pain on dorsal palpation.   No hyperkeratotic nor porokeratotic lesions present on today's visit.  Musculoskeletal Examination: Muscle strength 5/5 to all lower extremity muscle groups bilaterally. Pes planus deformity noted bilateral LE. Utilizes wheelchair for mobility assistance.  Radiographs: None  Last A1c:       No data to display         Assessment/Plan: 1. Pain due to onychomycosis of toenails of both feet     -Consent  given for treatment as described below: -Examined patient. -Patient to continue soft, supportive shoe gear daily. -Mycotic toenails 1-5 bilaterally were debrided in length and girth with sterile nail nippers and dremel without incident. -Patient/POA to call should there be question/concern in the interim.   Return in about 3 months (around 01/07/2024).  Freddie Breech, DPM      [place] LOCATION: [address], [place] [address]                   Office [phone]   [facility] LOCATION: [address] [place], [place] [address] Office [phone]

## 2023-12-15 ENCOUNTER — Other Ambulatory Visit: Payer: Medicare Other

## 2023-12-15 DIAGNOSIS — I110 Hypertensive heart disease with heart failure: Secondary | ICD-10-CM

## 2023-12-15 DIAGNOSIS — I5032 Chronic diastolic (congestive) heart failure: Secondary | ICD-10-CM | POA: Diagnosis not present

## 2023-12-16 LAB — CBC WITH DIFFERENTIAL/PLATELET
Absolute Lymphocytes: 1221 {cells}/uL (ref 850–3900)
Absolute Monocytes: 726 {cells}/uL (ref 200–950)
Basophils Absolute: 50 {cells}/uL (ref 0–200)
Basophils Relative: 0.9 %
Eosinophils Absolute: 50 {cells}/uL (ref 15–500)
Eosinophils Relative: 0.9 %
HCT: 40.9 % (ref 35.0–45.0)
Hemoglobin: 13.4 g/dL (ref 11.7–15.5)
MCH: 30.4 pg (ref 27.0–33.0)
MCHC: 32.8 g/dL (ref 32.0–36.0)
MCV: 92.7 fL (ref 80.0–100.0)
MPV: 9.7 fL (ref 7.5–12.5)
Monocytes Relative: 13.2 %
Neutro Abs: 3454 {cells}/uL (ref 1500–7800)
Neutrophils Relative %: 62.8 %
Platelets: 229 10*3/uL (ref 140–400)
RBC: 4.41 10*6/uL (ref 3.80–5.10)
RDW: 12.5 % (ref 11.0–15.0)
Total Lymphocyte: 22.2 %
WBC: 5.5 10*3/uL (ref 3.8–10.8)

## 2023-12-16 LAB — COMPLETE METABOLIC PANEL WITH GFR
AG Ratio: 1.4 (calc) (ref 1.0–2.5)
ALT: 13 U/L (ref 6–29)
AST: 20 U/L (ref 10–35)
Albumin: 4.1 g/dL (ref 3.6–5.1)
Alkaline phosphatase (APISO): 50 U/L (ref 37–153)
BUN/Creatinine Ratio: 20 (calc) (ref 6–22)
BUN: 23 mg/dL (ref 7–25)
CO2: 29 mmol/L (ref 20–32)
Calcium: 9.7 mg/dL (ref 8.6–10.4)
Chloride: 102 mmol/L (ref 98–110)
Creat: 1.16 mg/dL — ABNORMAL HIGH (ref 0.60–0.95)
Globulin: 2.9 g/dL (ref 1.9–3.7)
Glucose, Bld: 89 mg/dL (ref 65–99)
Potassium: 4.5 mmol/L (ref 3.5–5.3)
Sodium: 141 mmol/L (ref 135–146)
Total Bilirubin: 0.7 mg/dL (ref 0.2–1.2)
Total Protein: 7 g/dL (ref 6.1–8.1)
eGFR: 41 mL/min/{1.73_m2} — ABNORMAL LOW (ref >=60)

## 2023-12-16 LAB — TSH: TSH: 3.54 m[IU]/L (ref 0.40–4.50)

## 2023-12-16 LAB — LIPID PANEL
Cholesterol: 184 mg/dL (ref <200)
HDL: 78 mg/dL (ref >=50)
LDL Cholesterol (Calc): 93 mg/dL
Non-HDL Cholesterol (Calc): 106 mg/dL (ref <130)
Total CHOL/HDL Ratio: 2.4 (calc) (ref <5.0)
Triglycerides: 51 mg/dL (ref <150)

## 2023-12-18 ENCOUNTER — Ambulatory Visit (INDEPENDENT_AMBULATORY_CARE_PROVIDER_SITE_OTHER): Payer: Medicare Other | Admitting: Family

## 2023-12-18 ENCOUNTER — Encounter: Payer: Self-pay | Admitting: Family

## 2023-12-18 VITALS — BP 110/70 | HR 78 | Temp 98.5°F | Resp 16 | Ht <= 58 in | Wt 110.0 lb

## 2023-12-18 DIAGNOSIS — R600 Localized edema: Secondary | ICD-10-CM | POA: Diagnosis not present

## 2023-12-18 DIAGNOSIS — I872 Venous insufficiency (chronic) (peripheral): Secondary | ICD-10-CM | POA: Diagnosis not present

## 2023-12-18 DIAGNOSIS — R300 Dysuria: Secondary | ICD-10-CM | POA: Diagnosis not present

## 2023-12-18 DIAGNOSIS — G301 Alzheimer's disease with late onset: Secondary | ICD-10-CM | POA: Diagnosis not present

## 2023-12-18 DIAGNOSIS — F411 Generalized anxiety disorder: Secondary | ICD-10-CM

## 2023-12-18 DIAGNOSIS — F02818 Dementia in other diseases classified elsewhere, unspecified severity, with other behavioral disturbance: Secondary | ICD-10-CM

## 2023-12-18 DIAGNOSIS — I5032 Chronic diastolic (congestive) heart failure: Secondary | ICD-10-CM

## 2023-12-18 DIAGNOSIS — I110 Hypertensive heart disease with heart failure: Secondary | ICD-10-CM

## 2023-12-18 DIAGNOSIS — R682 Dry mouth, unspecified: Secondary | ICD-10-CM

## 2023-12-18 MED ORDER — FUROSEMIDE 20 MG PO TABS: 10.0000 mg | ORAL_TABLET | Freq: Every day | ORAL | 1 refills | Status: DC | PRN

## 2023-12-18 NOTE — Progress Notes (Signed)
Provider: Richarda Blade FNP-C   Azaiah Mello, Donalee Citrin, NP  Patient Care Team: Randa Riss, Donalee Citrin, NP as PCP - General (Family Medicine) Wendall Stade, MD as PCP - Cardiology (Cardiology) Gibson Ramp, MD as Referring Physician (Internal Medicine) Valeria Batman, MD (Inactive) as Attending Physician (Orthopedic Surgery) Olivia Canter, MD as Consulting Physician (Ophthalmology) Michele Mcalpine, MD as Consulting Physician (Pulmonary Disease)  Extended Emergency Contact Information Primary Emergency Contact: Follett,Michael Address: [address]          [place], [place] [address] [place] of [place] Home Phone: [phone] Mobile Phone: [phone] Relation: Son Secondary Emergency Contact: [facility] Phone: [phone] Relation: Other Preferred language: English Interpreter needed? No  Code Status:  Full Code  Goals of care: Advanced Directive information    12/18/2023   10:50 AM  Advanced Directives  Does Patient Have a Medical Advance Directive? No  Would patient like information on creating a medical advance directive? No - Patient declined     Chief Complaint  Patient presents with   Medical Management of Chronic Issues    6 month follow up and discuss Medicare Annual Wellness Visit and pneumonia vaccine.   Discussed the use of AI scribe software for clinical note transcription with the patient, who gave verbal consent to proceed.  History of Present Illness   The patient is a 89 year old female who presents for a six-month follow-up and review of lab results.  She has been experiencing burning during urination for the past three days, which she describes as a new symptom. She mentions having similar symptoms in the past but did not report them as they were not as severe. She is able to provide a urine specimen for further evaluation. she denies any fever or chills.  She describes a sensation of heaviness and swelling in her legs, which she  manages by wearing compression socks. Her legs swell less when elevated. She has reduced her diuretic medication due to excessive urination and uses two pads due to the heaviness of her urine output.  She experiences a dry mouth and has been coughing up clear, thick mucus. No shortness of breath, but she has to clear her throat frequently. She takes famotidine for acid reflux, which still causes her discomfort despite medication.  She maintains a stable weight of 110 pounds and reports a good appetite, consuming meals on wheels and Ensure. She drinks water but has reduced her intake in the evenings to manage her urinary symptoms.  She has a history of congestive heart failure and takes spironolactone for blood pressure, vitamin D, and carvedilol for Congestive heart Failure. She also takes Tylenol for left knee pain as needed.  She reports a history of constipation and takes Senekot and Miralax as needed to manage her bowel movements. She describes her bowel movements as sometimes being cold and difficult to pass.          Past Medical History:  Diagnosis Date   Abdominal pain, left lower quadrant 06/22/2013   Abnormal CT scan of lung 07/16/2015   Allergic rhinitis, cause unspecified 02/21/2014   Anxiety state, unspecified    Breast cancer (HCC)    NO STICK OR BLOOD PRESSURE CHECKS IN LEFT ARM   Carpal tunnel syndrome    Carpal tunnel syndrome on both sides 10/03/2008   Qualifier: Diagnosis of  By: Kriste Basque MD, Lonzo Cloud    Cervical spondylosis    Chronic pain syndrome 03/03/2019   Colon polyp 12/19/2009  Transverse-polypoid colorectal mucosa   Constipation    Cough 10/21/2014   Degenerative joint disease    Diastolic dysfunction    Diverticula of colon    Diverticulosis of colon 05/24/2002   Qualifier: Diagnosis of  By: Thereasa Solo     Gastritis, chronic    Hiatal hernia    Hypertension    Low back pain    LPRD (laryngopharyngeal reflux disease) 04/04/2014   Osteoporosis  05/22/2020   RBBB (right bundle branch block with left anterior fascicular block)    Renal cyst    Venous insufficiency    Past Surgical History:  Procedure Laterality Date   CARPAL TUNNEL RELEASE     INTRAMEDULLARY (IM) NAIL INTERTROCHANTERIC Right 11/13/2021   Procedure: INTRAMEDULLARY (IM) NAIL INTERTROCHANTRIC;  Surgeon: Huel Cote, MD;  Location: [facility];  Service: Orthopedics;  Laterality: Right;   Left mastectomy      Allergies  Allergen Reactions   Fentanyl Nausea Only and Other (See Comments)    Pt states that she is not allergic     Naprosyn [Naproxen] Other (See Comments)    Patient felt tongue and face swelling. Went to ED where no visible swelling noted    Allergies as of 12/18/2023       Reactions   Fentanyl Nausea Only, Other (See Comments)   Pt states that she is not allergic     Naprosyn [naproxen] Other (See Comments)   Patient felt tongue and face swelling. Went to ED where no visible swelling noted        Medication List        Accurate as of December 18, 2023 11:10 AM. If you have any questions, ask your nurse or doctor.          acetaminophen 500 MG tablet Commonly known as: TYLENOL Take 500 mg by mouth every 6 (six) hours as needed for moderate pain or headache.   carvedilol 3.125 MG tablet Commonly known as: COREG TAKE 1 TABLET BY MOUTH TWICE DAILY. HOLD FOR SBP <110 OR IF HR <70   cholecalciferol 1000 units tablet Commonly known as: VITAMIN D Take 1,000 Units by mouth daily.   famotidine 20 MG tablet Commonly known as: PEPCID Take 1 tablet (20 mg total) by mouth daily.   feeding supplement Liqd Take 237 mLs by mouth 2 (two) times daily between meals.   furosemide 20 MG tablet Commonly known as: LASIX Take 0.5 tablets (10 mg total) by mouth daily.   lidocaine 5 % Commonly known as: LIDODERM Place 1 patch onto the skin as needed. Remove & Discard patch within 12 hours or as directed by MD   LORazepam 0.5 MG tablet Commonly  known as: ATIVAN Take 0.5 mg by mouth as needed.   multivitamins ther. w/minerals Tabs tablet Take 1 tablet by mouth daily.   mupirocin ointment 2 % Commonly known as: BACTROBAN Apply to top of left foot once daily every morning.   polyethylene glycol 17 g packet Commonly known as: MIRALAX / GLYCOLAX Take 17 g by mouth daily.   sennosides-docusate sodium 8.6-50 MG tablet Commonly known as: SENOKOT-S Take 1-2 tablets by mouth daily as needed for constipation.   spironolactone 25 MG tablet Commonly known as: ALDACTONE Take 1 tablet (25 mg total) by mouth daily.   SYSTANE COMPLETE OP Apply 1 drop to eye daily as needed (dry eyes).   triamcinolone cream 0.1 % Commonly known as: KENALOG Apply 1 Application topically 2 (two) times daily.  Review of Systems  Constitutional:  Negative for appetite change, chills, fatigue, fever and unexpected weight change.  HENT:  Negative for congestion, dental problem, ear discharge, ear pain, facial swelling, hearing loss, nosebleeds, postnasal drip, rhinorrhea, sinus pressure, sinus pain, sneezing, sore throat, tinnitus and trouble swallowing.   Eyes:  Negative for pain, discharge, redness, itching and visual disturbance.  Respiratory:  Negative for cough, chest tightness, shortness of breath and wheezing.   Cardiovascular:  Negative for chest pain, palpitations and leg swelling.  Gastrointestinal:  Negative for abdominal distention, abdominal pain, blood in stool, constipation, diarrhea, nausea and vomiting.  Endocrine: Negative for cold intolerance, heat intolerance, polydipsia, polyphagia and polyuria.  Genitourinary:  Negative for difficulty urinating, dysuria, flank pain, frequency and urgency.  Musculoskeletal:  Positive for arthralgias and gait problem. Negative for back pain, joint swelling, myalgias, neck pain and neck stiffness.       Chronic left knee pain   Skin:  Negative for color change, pallor, rash and wound.   Neurological:  Negative for dizziness, syncope, speech difficulty, weakness, light-headedness, numbness and headaches.  Hematological:  Does not bruise/bleed easily.  Psychiatric/Behavioral:  Negative for agitation, behavioral problems, confusion, hallucinations, self-injury, sleep disturbance and suicidal ideas. The patient is not nervous/anxious.     Immunization History  Administered Date(s) Administered   Fluad Quad(high Dose 65+) 08/19/2021   Fluad Trivalent(High Dose 65+) 08/07/2023   Influenza, High Dose Seasonal PF 08/21/2015   Influenza,inj,Quad PF,6+ Mos 09/18/2015   PFIZER(Purple Top)SARS-COV-2 Vaccination 04/18/2020, 05/10/2020   Pneumococcal Polysaccharide-23 02/07/2015   Pertinent  Health Maintenance Due  Topic Date Due   INFLUENZA VACCINE  Completed   DEXA SCAN  Completed      12/05/2022    3:50 PM 07/24/2023    2:03 PM 08/07/2023   11:09 AM 09/18/2023    1:08 PM 12/18/2023   10:49 AM  Fall Risk  Falls in the past year? 1 0 1 0 0  Was there an injury with Fall? 1  1 0 0  Fall Risk Category Calculator 3  2 0 0  Patient at Risk for Falls Due to History of fall(s);Impaired balance/gait;Impaired mobility  History of fall(s);Impaired balance/gait;Impaired mobility No Fall Risks   Fall risk Follow up Falls evaluation completed;Education provided;Falls prevention discussed  Falls evaluation completed     Functional Status Survey:    Vitals:   12/18/23 1058  BP: 110/70  Pulse: 78  Resp: 16  Temp: 98.5 F (36.9 C)  SpO2: 97%  Weight: 110 lb (49.9 kg)  Height: 4\' 10"  (1.473 m)   Body mass index is 22.99 kg/m. Physical Exam  VITALS: T- 98.5, P- 78, BP- 110/70, SaO2- 97 MEASUREMENTS: WT- 110 lbs  HEENT: Ears clean, eardrums intact with no signs of infection. No nasal swelling. Oral cavity without lesions, pharynx without erythema or exudates. NECK: No cervical lymphadenopathy. CHEST: Lung fields clear to auscultation bilaterally. CARDIOVASCULAR: Heart sounds  regular without murmurs, rubs, or gallops. EXTREMITIES: No edema in legs. Left knee tender without swelling. Right knee without tenderness or swelling. SKIN: Dry and scaly skin on left leg, right leg skin intact.      Labs reviewed: Recent Labs    06/08/23 1133 12/15/23 1111  NA 142 141  K 4.7 4.5  CL 105 102  CO2 31 29  GLUCOSE 88 89  BUN 26* 23  CREATININE 0.98* 1.16*  CALCIUM 9.5 9.7   Recent Labs    06/08/23 1133 12/15/23 1111  AST 23 20  ALT  18 13  BILITOT 0.5 0.7  PROT 6.3 7.0   Recent Labs    06/08/23 1133 12/15/23 1111  WBC 5.5 5.5  NEUTROABS 3,465 3,454  HGB 12.5 13.4  HCT 38.6 40.9  MCV 93.5 92.7  PLT 239 229   Lab Results  Component Value Date   TSH 3.54 12/15/2023   No results found for: "HGBA1C" Lab Results  Component Value Date   CHOL 184 12/15/2023   HDL 78 12/15/2023   LDLCALC 93 12/15/2023   TRIG 51 12/15/2023   CHOLHDL 2.4 12/15/2023    Significant Diagnostic Results in last 30 days:  No results found.  Assessment/Plan  Urinary Tract Infection (UTI) Reports burning sensation during urination for the past three days. No fever noted. Possible UTI given symptoms and history. Discussed need for urine specimen to confirm diagnosis and potential antibiotic therapy based on culture results. - Obtain urine specimen for urinalysis and culture. She was unable to provide urine specimen. Urine cup and hat given to collect specimen at home then care giver to bring specimen to office. - Consider antibiotic therapy based on urine culture results  Congestive Heart Failure (CHF) Current symptoms of foamy sputum and leg swelling. Using compression stockings and elevating legs to manage swelling. No current shortness of breath. Discussed importance of continuing current management strategies and using diuretics as needed. - Continue wearing compression stockings - Continue leg elevation - Use diuretics as needed for leg swelling  Chronic Venous  Insufficiency Reports leg swelling and dry, scaly skin on the left leg. Using compression stockings and topical treatments. Discussed importance of monitoring for signs of infection and continuing current treatment regimen. - Continue using compression stockings - Apply triamcinolone cream to affected areas - Monitor for signs of infection or worsening symptoms  Hypertension Blood pressure well-controlled with current medication regimen. Today's reading was 110/70 mmHg. Discussed importance of continuing current medications. - Continue current antihypertensive medications (spironolactone, carvedilol)  Gastroesophageal Reflux Disease (GERD) Reports taking famotidine for acid reflux. Symptoms include bitter taste and mucus production. Discussed importance of continuing medication and maintaining adequate hydration. - Continue famotidine before meals - Encourage adequate hydration  Generalized anxiety disorder  -stable  - off Lorazepam   Constipation Reports using Senekot and Miralax as needed for constipation. No current issues noted. Discussed importance of continuing current regimen as needed. - Continue Senokot and Miralax as needed  Dry Mouth Reports persistent dry mouth. No specific cause identified. Discussed importance of frequent hydration and potential use of saliva substitutes if symptoms persist. - Encourage frequent sips of water - Consider saliva substitutes if symptoms persist  General Health Maintenance Good vital signs. Weight stable at 110 lbs. Good appetite and nutritional intake with Ensure supplements. Discussed importance of maintaining a balanced diet and hydration. - Continue Ensure supplements - Encourage balanced diet and hydration   Follow-up - Follow up in 6 months for routine check-up - Review urine culture results and adjust treatment as needed.    Family/ staff Communication: Reviewed plan of care with patient verbalized understanding   Labs/tests  ordered:  - POC Urinalysis Dipstick - TSH; Future - COMPLETE METABOLIC PANEL WITH GFR; Future - CBC with Differential/Platelet; Future - Lipid panel; Future  Next Appointment : Return in about 6 months (around 06/16/2024) for medical mangement of chronic issues., Fasting labs in 6 months prior to visit.   Caesar Bookman, NP

## 2024-02-02 ENCOUNTER — Ambulatory Visit: Payer: Medicare Other | Admitting: Podiatry

## 2024-03-07 ENCOUNTER — Ambulatory Visit: Payer: Self-pay

## 2024-03-07 NOTE — Telephone Encounter (Signed)
 Called and spoke with Son. He stated that he could not bring her after 11 due to work. Cannot do a Virtual because he would not be with her.   Nothing available today nor tomorrow. Son is wanting to pick up the supplies and collect urine and send off.   Please Advise.

## 2024-03-07 NOTE — Telephone Encounter (Signed)
  Chief Complaint: burning with urination Symptoms: slight burning with urination Frequency: last couple of days Pertinent Negatives: Patient denies fever Disposition: [] ED /[] Urgent Care (no appt availability in office) / [] Appointment(In office/virtual)/ []  [facility]/ [] Home Care/ [] Refused Recommended Disposition /[] [facility]/ [x]  Follow-up with PCP Additional Notes: Son calling for patient who told son that she is having mild burning with urination every so often. Burning sensation started about three-four days ago. Reports patient is having urinary frequency along with burning. Attempted to schedule patient for tomorrow but son has an availability issue with patient's ride. Son is asking to be called to schedule a time in the morning. Also he is asking if he can pick up a hat to get a urine sample from patient. Verbalized understanding of plan and all questions answered.    Copied from CRM [id number]. Topic: Clinical - Red Word Triage >> Mar 07, 2024 12:42 PM Adrianna P wrote: Red Word that prompted transfer to Nurse Triage: burning when urinating Reason for Disposition  All other patients with painful urination  (Exception: [1] EITHER frequency or urgency AND [2] has on-call doctor.)  Answer Assessment - Initial Assessment Questions 1. SEVERITY: "How bad is the pain?"  (e.g., Scale 1-10; mild, moderate, or severe)   - MILD (1-3): complains slightly about urination hurting   - MODERATE (4-7): interferes with normal activities     - SEVERE (8-10): excruciating, unwilling or unable to urinate because of the pain      Mild 2. FREQUENCY: "How many times have you had painful urination today?"      Son is unsure how many times 3. PATTERN: "Is pain present every time you urinate or just sometimes?"      Just sometimes 4. ONSET: "When did the painful urination start?"      3-4 days 5. FEVER: "Do you have a fever?" If Yes, ask: "What is your temperature, how was it  measured, and when did it start?"     no 6. PAST UTI: "Have you had a urine infection before?" If Yes, ask: "When was the last time?" and "What happened that time?"      yes 7. CAUSE: "What do you think is causing the painful urination?"  (e.g., UTI, scratch, Herpes sore)     unsure 8. OTHER SYMPTOMS: "Do you have any other symptoms?" (e.g., blood in urine, flank pain, genital sores, urgency, vaginal discharge)     Frequent urination  Protocols used: Urination Pain - Female-A-AH

## 2024-03-07 NOTE — Telephone Encounter (Signed)
 Please schedule virtual visit to evaluate patient symptoms.

## 2024-03-08 ENCOUNTER — Encounter (HOSPITAL_COMMUNITY): Payer: Self-pay

## 2024-03-08 ENCOUNTER — Emergency Department (HOSPITAL_COMMUNITY)
Admission: EM | Admit: 2024-03-08 | Discharge: 2024-03-09 | Disposition: A | Discharge status: Home / Self Care | Attending: Emergency Medicine | Admitting: Emergency Medicine

## 2024-03-08 DIAGNOSIS — N3001 Acute cystitis with hematuria: Secondary | ICD-10-CM | POA: Diagnosis not present

## 2024-03-08 DIAGNOSIS — I10 Essential (primary) hypertension: Secondary | ICD-10-CM | POA: Insufficient documentation

## 2024-03-08 DIAGNOSIS — Z853 Personal history of malignant neoplasm of breast: Secondary | ICD-10-CM | POA: Insufficient documentation

## 2024-03-08 DIAGNOSIS — Z79899 Other long term (current) drug therapy: Secondary | ICD-10-CM | POA: Insufficient documentation

## 2024-03-08 DIAGNOSIS — N939 Abnormal uterine and vaginal bleeding, unspecified: Secondary | ICD-10-CM | POA: Diagnosis not present

## 2024-03-08 DIAGNOSIS — R58 Hemorrhage, not elsewhere classified: Secondary | ICD-10-CM | POA: Diagnosis not present

## 2024-03-08 NOTE — ED Triage Notes (Addendum)
 Pt arrived via EMS from home w/ c/o of heavy vaginal bleeding for about a day. Pt is on hospice for heart failure. Pt's hospice nurse stated that pt soaked through 5 heavy duty pads within a day. Pt denies abdominal pain, urgency, or any foul odors. Denies N/V/D. Pt endorses dysuria.   BP 118/72 HR 74 O2 98 Cbg 101

## 2024-03-08 NOTE — Telephone Encounter (Signed)
 Called and spoke with son, Video Visit scheduled for tomorrow with Dinah.

## 2024-03-09 ENCOUNTER — Ambulatory Visit: Admitting: Family

## 2024-03-09 LAB — COMPREHENSIVE METABOLIC PANEL WITH GFR
ALT: 20 U/L (ref 0–44)
AST: 26 U/L (ref 15–41)
Albumin: 3.3 g/dL — ABNORMAL LOW (ref 3.5–5.0)
Alkaline Phosphatase: 52 U/L (ref 38–126)
Anion gap: 8 (ref 5–15)
BUN: 31 mg/dL — ABNORMAL HIGH (ref 8–23)
CO2: 24 mmol/L (ref 22–32)
Calcium: 9 mg/dL (ref 8.9–10.3)
Chloride: 105 mmol/L (ref 98–111)
Creatinine, Ser: 1.05 mg/dL — ABNORMAL HIGH (ref 0.44–1.00)
GFR, Estimated: 47 mL/min — ABNORMAL LOW (ref >60)
Glucose, Bld: 86 mg/dL (ref 70–99)
Potassium: 4.6 mmol/L (ref 3.5–5.1)
Sodium: 137 mmol/L (ref 135–145)
Total Bilirubin: 1.1 mg/dL (ref 0.0–1.2)
Total Protein: 6.2 g/dL — ABNORMAL LOW (ref 6.5–8.1)

## 2024-03-09 LAB — CBC WITH DIFFERENTIAL/PLATELET
Abs Immature Granulocytes: 0.03 10*3/uL (ref 0.00–0.07)
Basophils Absolute: 0 10*3/uL (ref 0.0–0.1)
Basophils Relative: 0 %
Eosinophils Absolute: 0.1 10*3/uL (ref 0.0–0.5)
Eosinophils Relative: 1 %
HCT: 37.2 % (ref 36.0–46.0)
Hemoglobin: 12 g/dL (ref 12.0–15.0)
Immature Granulocytes: 0 %
Lymphocytes Relative: 19 %
Lymphs Abs: 1.3 10*3/uL (ref 0.7–4.0)
MCH: 31.3 pg (ref 26.0–34.0)
MCHC: 32.3 g/dL (ref 30.0–36.0)
MCV: 97.1 fL (ref 80.0–100.0)
Monocytes Absolute: 1 10*3/uL (ref 0.1–1.0)
Monocytes Relative: 14 %
Neutro Abs: 4.7 10*3/uL (ref 1.7–7.7)
Neutrophils Relative %: 66 %
Platelets: 207 10*3/uL (ref 150–400)
RBC: 3.83 MIL/uL — ABNORMAL LOW (ref 3.87–5.11)
RDW: 14.1 % (ref 11.5–15.5)
WBC: 7.2 10*3/uL (ref 4.0–10.5)
nRBC: 0 % (ref 0.0–0.2)

## 2024-03-09 LAB — URINALYSIS, W/ REFLEX TO CULTURE (INFECTION SUSPECTED)
Bilirubin Urine: NEGATIVE
Glucose, UA: NEGATIVE mg/dL
Ketones, ur: NEGATIVE mg/dL
Nitrite: NEGATIVE
Protein, ur: NEGATIVE mg/dL
Specific Gravity, Urine: 1.004 — ABNORMAL LOW (ref 1.005–1.030)
WBC, UA: >50 WBC/hpf (ref 0–5)
pH: 7 (ref 5.0–8.0)

## 2024-03-09 MED ORDER — CEPHALEXIN 500 MG PO CAPS
500.0000 mg | ORAL_CAPSULE | Freq: Three times a day (TID) | ORAL | 0 refills | Status: AC
Start: 2024-03-09 — End: 2024-03-23

## 2024-03-09 MED ORDER — CEPHALEXIN 500 MG PO CAPS
500.0000 mg | ORAL_CAPSULE | Freq: Once | ORAL | Status: AC
  Administered 2024-03-09: 500 mg via ORAL
  Filled 2024-03-09: qty 1

## 2024-03-09 NOTE — ED Provider Notes (Signed)
 [place] EMERGENCY DEPARTMENT AT [facility] Provider Note  CSN: [id number] Arrival date & time: 03/08/24 2334  Chief Complaint(s) Vaginal Bleeding  HPI Heather [name] is a 89 y.o. female here for possible vaginal bleeding.  She is accompanied by her son who reports they noted blood in her pads today.  Patient reports that she has noted some blood for the past several days.  She also endorses urinary frequency and dysuria.  The history is provided by the patient and a relative.    Past Medical History Past Medical History:  Diagnosis Date   Abdominal pain, left lower quadrant 06/22/2013   Abnormal CT scan of lung 07/16/2015   Allergic rhinitis, cause unspecified 02/21/2014   Anxiety state, unspecified    Breast cancer (HCC)    NO STICK OR BLOOD PRESSURE CHECKS IN LEFT ARM   Carpal tunnel syndrome    Carpal tunnel syndrome on both sides 10/03/2008   Qualifier: Diagnosis of  By: Elinor Guardian MD, Raenelle Bumpers    Cervical spondylosis    Chronic pain syndrome 03/03/2019   Colon polyp 12/19/2009   Transverse-polypoid colorectal mucosa   Constipation    Cough 10/21/2014   Degenerative joint disease    Diastolic dysfunction    Diverticula of colon    Diverticulosis of colon 05/24/2002   Qualifier: Diagnosis of  By: Annette Barters     Gastritis, chronic    Hiatal hernia    Hypertension    Low back pain    LPRD (laryngopharyngeal reflux disease) 04/04/2014   Osteoporosis 05/22/2020   RBBB (right bundle branch block with left anterior fascicular block)    Renal cyst    Venous insufficiency    Patient Active Problem List   Diagnosis Date Noted   ABLA 11/23/2021   Lab test positive for detection of COVID-19 virus 11/23/2021   Acute metabolic encephalopathy 11/23/2021   Pressure injury of skin 11/19/2021   Closed displaced fracture of right femoral neck (HCC) 11/11/2021   Restlessness 11/11/2021   Seasonal allergies 06/14/2021   Primary osteoarthritis involving multiple  joints 06/14/2021   Pre-ulcerative corn or callous 10/04/2020   Osteoporosis 05/22/2020   Kyphosis of thoracic region 05/17/2020   DDD (degenerative disc disease), lumbar 10/04/2019   Hearing decreased, bilateral 06/30/2019   Chronic pain syndrome 03/03/2019   Unilateral primary osteoarthritis, right knee 01/13/2019   Medication management 02/28/2018   Urinary incontinence without sensory awareness 12/02/2017   Arthritis of left hip 10/20/2017   Functional abdominal pain syndrome 09/02/2017   Encounter for chronic pain management 07/22/2017   Dementia with behavioral disturbance 06/25/2017   Vitamin D  deficiency 05/21/2017   Chronic LLQ pain 11/23/2014   Esophageal dysmotility 10/27/2014   Arthritis of hand, degenerative 04/10/2014   Other and unspecified hyperlipidemia 04/10/2014   Hx of breast cancer 08/30/2012   Venous insufficiency of both lower extremities 06/12/2011   GERD without esophagitis 03/27/2010   Chronic diastolic CHF (congestive heart failure) (HCC) 03/21/2009   Constipation 01/18/2009   Cervical spondylosis without myelopathy 03/21/2008   Diaphragmatic hernia 12/26/2007   Anxiety state 09/15/2007   Primary hypertension 09/15/2007   Venous (peripheral) insufficiency 09/15/2007   Home Medication(s) Prior to Admission medications   Medication Sig Start Date End Date Taking? Authorizing Provider  cephALEXin  (KEFLEX ) 500 MG capsule Take 1 capsule (500 mg total) by mouth 3 (three) times daily for 14 days. 03/09/24 03/23/24 Yes Natalie Mceuen, Camila Cecil, MD  acetaminophen  (TYLENOL ) 500 MG tablet Take 500 mg by mouth every  6 (six) hours as needed for moderate pain or headache.    [provider]  carvedilol  (COREG ) 3.125 MG tablet TAKE 1 TABLET BY MOUTH TWICE DAILY. HOLD FOR SBP <110 OR IF HR <70 08/03/23   Ngetich, Dinah C, NP  cholecalciferol (VITAMIN D ) 1000 units tablet Take 1,000 Units by mouth daily.    [provider]  famotidine  (PEPCID ) 20 MG tablet  Take 1 tablet (20 mg total) by mouth daily. 07/24/23   Ngetich, Dinah C, NP  feeding supplement, ENSURE ENLIVE, (ENSURE ENLIVE) LIQD Take 237 mLs by mouth 2 (two) times daily between meals. 09/30/16   Nche, Connye Delaine, NP  furosemide  (LASIX ) 20 MG tablet Take 0.5 tablets (10 mg total) by mouth daily as needed. 12/18/23   Ngetich, Dinah C, NP  Multiple Vitamins-Minerals (MULTIVITAMINS THER. W/MINERALS) TABS Take 1 tablet by mouth daily.    [provider]  polyethylene glycol (MIRALAX  / GLYCOLAX ) 17 g packet Take 17 g by mouth daily.    [provider]  Propylene Glycol (SYSTANE COMPLETE OP) Apply 1 drop to eye daily as needed (dry eyes).    [provider]  sennosides-docusate sodium  (SENOKOT-S) 8.6-50 MG tablet Take 1-2 tablets by mouth daily as needed for constipation.    [provider]  spironolactone  (ALDACTONE ) 25 MG tablet Take 1 tablet (25 mg total) by mouth daily. 07/24/23   Ngetich, Dinah C, NP  triamcinolone  cream (KENALOG ) 0.1 % Apply 1 Application topically 2 (two) times daily. 07/08/23   Ngetich, Dinah C, NP                                                                                                                                    Allergies Fentanyl  and Naprosyn  [naproxen ]  Review of Systems Review of Systems As noted in HPI  Physical Exam Vital Signs  I have reviewed the triage vital signs BP (!) 122/52   Pulse 76   Temp 97.7 F (36.5 C) (Oral)   Resp 18   SpO2 100%   Physical Exam Vitals reviewed. Exam conducted with a chaperone present.  Constitutional:      General: She is not in acute distress.    Appearance: She is well-developed. She is not diaphoretic.  HENT:     Head: Normocephalic and atraumatic.     Right Ear: External ear normal.     Left Ear: External ear normal.     Nose: Nose normal.  Eyes:     General: No scleral icterus.    Conjunctiva/sclera: Conjunctivae normal.  Neck:     Trachea: Phonation normal.   Cardiovascular:     Rate and Rhythm: Normal rate and regular rhythm.  Pulmonary:     Effort: Pulmonary effort is normal. No respiratory distress.     Breath sounds: No stridor.  Abdominal:     General: There is no distension.     Tenderness: There is abdominal  tenderness in the suprapubic area.  Genitourinary:    Vagina: No bleeding.     Cervix: No cervical bleeding.     Comments: Blood clot noted at urethral meatus Musculoskeletal:        General: Normal range of motion.     Cervical back: Normal range of motion.  Neurological:     Mental Status: She is alert and oriented to person, place, and time.  Psychiatric:        Behavior: Behavior normal.     ED Results and Treatments Labs (all labs ordered are listed, but only abnormal results are displayed) Labs Reviewed  CBC WITH DIFFERENTIAL/PLATELET - Abnormal; Notable for the following components:      Result Value   RBC 3.83 (*)    All other components within normal limits  COMPREHENSIVE METABOLIC PANEL WITH GFR - Abnormal; Notable for the following components:   BUN 31 (*)    Creatinine, Ser 1.05 (*)    Total Protein 6.2 (*)    Albumin 3.3 (*)    GFR, Estimated 47 (*)    All other components within normal limits  URINALYSIS, W/ REFLEX TO CULTURE (INFECTION SUSPECTED) - Abnormal; Notable for the following components:   APPearance CLOUDY (*)    Specific Gravity, Urine 1.004 (*)    Hgb urine dipstick LARGE (*)    Leukocytes,Ua LARGE (*)    Bacteria, UA RARE (*)    All other components within normal limits  URINE CULTURE                                                                                                                         EKG  EKG Interpretation Date/Time:    Ventricular Rate:    PR Interval:    QRS Duration:    QT Interval:    QTC Calculation:   R Axis:      Text Interpretation:         Radiology No results found.  Medications Ordered in ED Medications  cephALEXin  (KEFLEX ) capsule 500  mg (500 mg Oral Given 03/09/24 0313)   Procedures Procedures  (including critical care time) Medical Decision Making / ED Course   Medical Decision Making Amount and/or Complexity of Data Reviewed Labs: ordered.  Risk Prescription drug management.    Patient presents for reported vaginal bleeding.  On exam, there is blood coming from the urethral meatus.  No blood in the vaginal vault noted.  She does have suprapubic discomfort.  UA with hematuria, leukocytes and WBCs concerning for UTI.  CBC without leukocytosis.  CMP without significant electrolyte derangements.  Mild renal insufficiency without AKI.  Patient is not septic.  Will be treated for UTI with Keflex .    Final Clinical Impression(s) / ED Diagnoses Final diagnoses:  Hematuria due to acute cystitis   The patient appears reasonably screened and/or stabilized for discharge and I doubt any other medical condition or other [facility] requiring further screening, evaluation, or treatment in the ED at  this time. I have discussed the findings, Dx and Tx plan with the patient/family who expressed understanding and agree(s) with the plan. Discharge instructions discussed at length. The patient/family was given strict return precautions who verbalized understanding of the instructions. No further questions at time of discharge.  Disposition: Discharge  Condition: Good  ED Discharge Orders          Ordered    cephALEXin  (KEFLEX ) 500 MG capsule  3 times daily        03/09/24 0346              Follow Up: Ngetich, Dinah C, NP [address] [place] [address] [phone]  Call  to schedule an appointment for close follow up    This chart was dictated using voice recognition software.  Despite best efforts to proofread,  errors can occur which can change the documentation meaning.    Lindle Rhea, MD 03/09/24 [phone]

## 2024-03-11 LAB — URINE CULTURE: Culture: >=100000 — AB

## 2024-03-12 ENCOUNTER — Telehealth (HOSPITAL_BASED_OUTPATIENT_CLINIC_OR_DEPARTMENT_OTHER): Payer: Self-pay | Admitting: *Deleted

## 2024-03-12 ENCOUNTER — Other Ambulatory Visit: Payer: Self-pay | Admitting: Family

## 2024-03-12 DIAGNOSIS — I10 Essential (primary) hypertension: Secondary | ICD-10-CM

## 2024-03-12 NOTE — Telephone Encounter (Signed)
 Post ED Visit - Positive Culture Follow-up  Culture report reviewed by antimicrobial stewardship pharmacist: [facility] []  Court Distance, Pharm.D. []  Skeet Duke, Pharm.D., BCPS AQ-ID []  Leslee Rase, Pharm.D., BCPS []  Garland Junk, Pharm.D., BCPS []  [place], [place].D., BCPS, AAHIVP []  Alcide Aly, Pharm.D., BCPS, AAHIVP []  Jerri Morale, PharmD, BCPS []  Graham Laws, PharmD, BCPS []  Cleda Curly, PharmD, BCPS []  Tamar Fairly, PharmD []  Ballard Levels, PharmD, BCPS []  Ollen Beverage, PharmD  [facility] []  Arlyne Bering, PharmD []  Sherryle Don, PharmD []  Van Gelinas, PharmD []  Delila Felty, Rph []  Luna Salinas) Cleora Daft, PharmD []  Augustina Block, PharmD []  Arie Kurtz, PharmD []  Sharlyn Deaner, PharmD []  Agnes Hose, PharmD []  Kendall Pauls, PharmD []  Gladstone Lamer, PharmD []  Armanda Bern, PharmD [x]  Arlyne Bering, PharmD   Positive urine culture Treated with Cephalexin , organism sensitive to the same and no further patient follow-up is required at this time.  Georgine Kitchens 03/12/2024, 11:14 AM

## 2024-05-21 ENCOUNTER — Other Ambulatory Visit: Payer: Self-pay | Admitting: Family

## 2024-05-23 NOTE — Telephone Encounter (Signed)
 High risk or very high risk warning populated when attempting to refill medication. RX request sent to PCP for review and approval if warranted.

## 2024-06-14 ENCOUNTER — Other Ambulatory Visit: Payer: Medicare Other

## 2024-06-14 DIAGNOSIS — F411 Generalized anxiety disorder: Secondary | ICD-10-CM

## 2024-06-14 DIAGNOSIS — R350 Frequency of micturition: Secondary | ICD-10-CM

## 2024-06-14 DIAGNOSIS — I5032 Chronic diastolic (congestive) heart failure: Secondary | ICD-10-CM

## 2024-06-14 DIAGNOSIS — R300 Dysuria: Secondary | ICD-10-CM

## 2024-06-14 DIAGNOSIS — I110 Hypertensive heart disease with heart failure: Secondary | ICD-10-CM

## 2024-06-24 ENCOUNTER — Ambulatory Visit: Payer: Medicare Other | Admitting: Family

## 2024-08-31 ENCOUNTER — Encounter: Payer: Self-pay | Admitting: Family

## 2024-08-31 ENCOUNTER — Ambulatory Visit: Admitting: Family

## 2024-08-31 VITALS — BP 112/60 | HR 84 | Temp 97.9°F | Resp 17

## 2024-08-31 DIAGNOSIS — Z23 Encounter for immunization: Secondary | ICD-10-CM

## 2024-08-31 DIAGNOSIS — R600 Localized edema: Secondary | ICD-10-CM

## 2024-08-31 DIAGNOSIS — F411 Generalized anxiety disorder: Secondary | ICD-10-CM

## 2024-08-31 DIAGNOSIS — I10 Essential (primary) hypertension: Secondary | ICD-10-CM

## 2024-08-31 DIAGNOSIS — I5032 Chronic diastolic (congestive) heart failure: Secondary | ICD-10-CM

## 2024-08-31 DIAGNOSIS — K219 Gastro-esophageal reflux disease without esophagitis: Secondary | ICD-10-CM

## 2024-08-31 DIAGNOSIS — K449 Diaphragmatic hernia without obstruction or gangrene: Secondary | ICD-10-CM

## 2024-08-31 DIAGNOSIS — G301 Alzheimer's disease with late onset: Secondary | ICD-10-CM

## 2024-08-31 DIAGNOSIS — F02818 Dementia in other diseases classified elsewhere, unspecified severity, with other behavioral disturbance: Secondary | ICD-10-CM

## 2024-08-31 MED ORDER — FUROSEMIDE 20 MG PO TABS: 10.0000 mg | ORAL_TABLET | Freq: Every day | ORAL | 1 refills | Status: AC | PRN

## 2024-08-31 MED ORDER — SPIRONOLACTONE 25 MG PO TABS: 25.0000 mg | ORAL_TABLET | Freq: Every day | ORAL | 1 refills | Status: AC

## 2024-08-31 NOTE — Patient Instructions (Addendum)
 1.Stop at check out and schedule Annual wellness visit. 2. Take Furosemide  20 mg tablet one by mouth daily x 3 days then resume 10 mg tablet daily as needed for leg swelling

## 2024-09-01 LAB — CBC WITH DIFFERENTIAL/PLATELET
Absolute Lymphocytes: 1380 {cells}/uL (ref 850–3900)
Absolute Monocytes: 847 {cells}/uL (ref 200–950)
Basophils Absolute: 52 {cells}/uL (ref 0–200)
Basophils Relative: 0.9 %
Eosinophils Absolute: 81 {cells}/uL (ref 15–500)
Eosinophils Relative: 1.4 %
HCT: 38.2 % (ref 35.0–45.0)
Hemoglobin: 12.5 g/dL (ref 11.7–15.5)
MCH: 30.6 pg (ref 27.0–33.0)
MCHC: 32.7 g/dL (ref 32.0–36.0)
MCV: 93.4 fL (ref 80.0–100.0)
MPV: 10 fL (ref 7.5–12.5)
Monocytes Relative: 14.6 %
Neutro Abs: 3439 {cells}/uL (ref 1500–7800)
Neutrophils Relative %: 59.3 %
Platelets: 214 Thousand/uL (ref 140–400)
RBC: 4.09 Million/uL (ref 3.80–5.10)
RDW: 12.9 % (ref 11.0–15.0)
Total Lymphocyte: 23.8 %
WBC: 5.8 Thousand/uL (ref 3.8–10.8)

## 2024-09-01 LAB — TSH: TSH: 2.67 m[IU]/L (ref 0.40–4.50)

## 2024-09-01 LAB — COMPLETE METABOLIC PANEL WITHOUT GFR
AG Ratio: 1.4 (calc) (ref 1.0–2.5)
ALT: 16 U/L (ref 6–29)
AST: 23 U/L (ref 10–35)
Albumin: 4 g/dL (ref 3.6–5.1)
Alkaline phosphatase (APISO): 46 U/L (ref 37–153)
BUN/Creatinine Ratio: 29 (calc) — ABNORMAL HIGH (ref 6–22)
BUN: 44 mg/dL — ABNORMAL HIGH (ref 7–25)
CO2: 27 mmol/L (ref 20–32)
Calcium: 9.5 mg/dL (ref 8.6–10.4)
Chloride: 103 mmol/L (ref 98–110)
Creat: 1.5 mg/dL — ABNORMAL HIGH (ref 0.60–0.95)
Globulin: 2.9 g/dL (ref 1.9–3.7)
Glucose, Bld: 101 mg/dL (ref 65–139)
Potassium: 4.6 mmol/L (ref 3.5–5.3)
Sodium: 141 mmol/L (ref 135–146)
Total Bilirubin: 0.7 mg/dL (ref 0.2–1.2)
Total Protein: 6.9 g/dL (ref 6.1–8.1)

## 2024-09-02 ENCOUNTER — Ambulatory Visit: Payer: Self-pay | Admitting: Family

## 2024-09-11 NOTE — Progress Notes (Signed)
 Provider: Roxan Plough FNP-C   Kariah Loredo, Roxan BROCKS, NP  Patient Care Team: Shereese Bonnie, Roxan BROCKS, NP as PCP - General (Family Medicine) Delford Maude BROCKS, MD as PCP - Cardiology (Cardiology) Winnie Ficks, MD as Referring Physician (Internal Medicine) Anderson Maude ORN, MD (Inactive) as Attending Physician (Orthopedic Surgery) Octavia Charlie Hamilton, MD as Consulting Physician (Ophthalmology) Christi Hamilton HERO, MD as Consulting Physician (Pulmonary Disease)  Extended Emergency Contact Information Primary Emergency Contact: Gruenberg,Michael Address: [address]          [place], [place] [address] [place] Home Phone: [phone] Mobile Phone: [phone] Relation: Son Secondary Emergency Contact: [facility] Phone: [phone] Relation: Other Preferred language: English Interpreter needed? No  Code Status:  Full Code  Goals of care: Advanced Directive information    08/31/2024    2:49 PM  Advanced Directives  Does Patient Have a Medical Advance Directive? No  Would patient like information on creating a medical advance directive? No - Patient declined     Chief Complaint  Patient presents with   Follow-up    Follow up discuss flu & pneumococcal vaccine & annual wellness visit.    Discussed the use of AI scribe software for clinical note transcription with the patient, who gave verbal consent to proceed.  History of Present Illness   Heather [name] is a 89 year old female who presents with neck pain and leg swelling.  She experiences neck pain that worsens with movement, particularly when bending or turning her head. The pain is intermittent and causes stiffness and difficulty reclining in chairs. She recalls using a neck brace approximately twenty years ago.  She reports significant swelling in her legs, persistent for about a year, with fluctuating severity. Wrapping the legs tightly has been uncomfortable and ineffective. She is currently taking  furosemide  and spironolactone .  She has a history of anxiety for which she takes Xanax . She also takes famotidine  for heartburn and carvedilol  for heart rate management, reporting good blood pressure control.  She describes a decrease in appetite and significant weight loss, noting that she drinks Ensure as a nutritional supplement. Her son assists in obtaining Ensure from a warehouse.  No recent falls, but she uses a walker for mobility. She experiences nasal congestion with thick discharge and occasional heartburn. She notes pain in her lower back, particularly on one side.    Past Medical History:  Diagnosis Date   Abdominal pain, left lower quadrant 06/22/2013   Abnormal CT scan of lung 07/16/2015   Allergic rhinitis, cause unspecified 02/21/2014   Anxiety state, unspecified    Breast cancer (HCC)    NO STICK OR BLOOD PRESSURE CHECKS IN LEFT ARM   Carpal tunnel syndrome    Carpal tunnel syndrome on both sides 10/03/2008   Qualifier: Diagnosis of  By: Christi MD, Hamilton HERO    Cervical spondylosis    Chronic pain syndrome 03/03/2019   Colon polyp 12/19/2009   Transverse-polypoid colorectal mucosa   Constipation    Cough 10/21/2014   Degenerative joint disease    Diastolic dysfunction    Diverticula of colon    Diverticulosis of colon 05/24/2002   Qualifier: Diagnosis of  By: Derrek Ramp     Gastritis, chronic    Hiatal hernia    Hypertension    Low back pain    LPRD (laryngopharyngeal reflux disease) 04/04/2014   Osteoporosis 05/22/2020   RBBB (right bundle branch block with left anterior fascicular block)    Renal cyst  Venous insufficiency    Past Surgical History:  Procedure Laterality Date   CARPAL TUNNEL RELEASE     INTRAMEDULLARY (IM) NAIL INTERTROCHANTERIC Right 11/13/2021   Procedure: INTRAMEDULLARY (IM) NAIL INTERTROCHANTRIC;  Surgeon: Genelle Standing, MD;  Location: [facility];  Service: Orthopedics;  Laterality: Right;   Left mastectomy      Allergies  Allergen  Reactions   Fentanyl  Nausea Only and Other (See Comments)    Pt states that she is not allergic     Naprosyn  [Naproxen ] Other (See Comments)    Patient felt tongue and face swelling. Went to ED where no visible swelling noted    Allergies as of 08/31/2024       Reactions   Fentanyl  Nausea Only, Other (See Comments)   Pt states that she is not allergic     Naprosyn  [naproxen ] Other (See Comments)   Patient felt tongue and face swelling. Went to ED where no visible swelling noted        Medication List        Accurate as of August 31, 2024 11:59 PM. If you have any questions, ask your nurse or doctor.          STOP taking these medications    SYSTANE COMPLETE OP Stopped by: Zamorah Ailes C Shareta Fishbaugh       TAKE these medications    acetaminophen  500 MG tablet Commonly known as: TYLENOL  Take 500 mg by mouth every 6 (six) hours as needed for moderate pain or headache.   ALPRAZolam  0.5 MG tablet Commonly known as: XANAX  Take 0.5 mg by mouth 3 (three) times daily as needed.   carvedilol  3.125 MG tablet Commonly known as: COREG  TAKE 1 TABLET BY MOUTH TWICE DAILY HOLD  FOR  SBP<  110  OR  HR < 70   D3 25 MCG (1000 UT) capsule Generic drug: Cholecalciferol Take 1,000 Units by mouth daily. What changed: Another medication with the same name was removed. Continue taking this medication, and follow the directions you see here. Changed by: Makaiah Terwilliger C Jurgen Groeneveld   famotidine  20 MG tablet Commonly known as: PEPCID  Take 1 tablet (20 mg total) by mouth daily.   feeding supplement Liqd Take 237 mLs by mouth 2 (two) times daily between meals.   furosemide  20 MG tablet Commonly known as: LASIX  Take 0.5 tablets (10 mg total) by mouth daily as needed.   multivitamins ther. w/minerals Tabs tablet Take 1 tablet by mouth daily.   polyethylene glycol 17 g packet Commonly known as: MIRALAX  / GLYCOLAX  Take 17 g by mouth daily.   sennosides-docusate sodium  8.6-50 MG tablet Commonly  known as: SENOKOT-S Take 1-2 tablets by mouth daily as needed for constipation.   spironolactone  25 MG tablet Commonly known as: ALDACTONE  Take 1 tablet (25 mg total) by mouth daily.   triamcinolone  cream 0.1 % Commonly known as: KENALOG  Apply 1 Application topically 2 (two) times daily.        Review of Systems  Constitutional:  Negative for appetite change, chills, fatigue, fever and unexpected weight change.  HENT:  Positive for congestion. Negative for dental problem, ear discharge, ear pain, facial swelling, hearing loss, nosebleeds, postnasal drip, rhinorrhea, sinus pressure, sinus pain, sneezing, sore throat, tinnitus and trouble swallowing.   Eyes:  Negative for pain, discharge, redness, itching and visual disturbance.  Respiratory:  Negative for cough, chest tightness, shortness of breath and wheezing.   Cardiovascular:  Negative for chest pain, palpitations and leg swelling.  Gastrointestinal:  Negative for abdominal  distention, abdominal pain, blood in stool, constipation, diarrhea, nausea and vomiting.  Endocrine: Negative for cold intolerance, heat intolerance, polydipsia, polyphagia and polyuria.  Genitourinary:  Negative for difficulty urinating, dysuria, flank pain, frequency and urgency.  Musculoskeletal:  Positive for arthralgias, back pain, gait problem and neck pain. Negative for joint swelling, myalgias and neck stiffness.  Skin:  Negative for color change, pallor, rash and wound.  Neurological:  Negative for dizziness, syncope, speech difficulty, weakness, light-headedness, numbness and headaches.  Hematological:  Does not bruise/bleed easily.  Psychiatric/Behavioral:  Negative for agitation, behavioral problems, confusion, hallucinations, self-injury, sleep disturbance and suicidal ideas. The patient is nervous/anxious.     Immunization History  Administered Date(s) Administered   Fluad Quad(high Dose 65+) 08/19/2021   Fluad Trivalent(High Dose 65+) 08/07/2023    INFLUENZA, HIGH DOSE SEASONAL PF 08/21/2015, 08/31/2024   Influenza,inj,Quad PF,6+ Mos 09/18/2015   PFIZER(Purple Top)SARS-COV-2 Vaccination 04/18/2020, 05/10/2020   Pneumococcal Polysaccharide-23 02/07/2015   Pertinent  Health Maintenance Due  Topic Date Due   Influenza Vaccine  Completed   DEXA SCAN  Completed   Mammogram  Discontinued      07/24/2023    2:03 PM 08/07/2023   11:09 AM 09/18/2023    1:08 PM 12/18/2023   10:49 AM 08/31/2024    2:49 PM  Fall Risk  Falls in the past year? 0 1 0 0 0  Was there an injury with Fall?  1 0 0 0  Fall Risk Category Calculator  2 0 0 0  Patient at Risk for Falls Due to  History of fall(s);Impaired balance/gait;Impaired mobility No Fall Risks  No Fall Risks  Fall risk Follow up  Falls evaluation completed   Falls evaluation completed   Functional Status Survey:    Vitals:   08/31/24 1453  BP: 112/60  Pulse: 84  Resp: 17  Temp: 97.9 F (36.6 C)  SpO2: 95%   There is no height or weight on file to calculate BMI. Physical Exam  VITALS: BP- 112/60 GENERAL: Alert, cooperative, well developed, no acute distress HEENT: Normocephalic, normal oropharynx, moist mucous membranes, ears and nose normal HOH NECK: Neck tightness noted, no frontal tenderness CHEST: Clear to auscultation bilaterally, no wheezes, rhonchi, or crackles CARDIOVASCULAR: Normal heart rate and rhythm, S1 and S2 normal without murmurs ABDOMEN: Soft, non-tender, non-distended, without organomegaly, normal bowel sounds EXTREMITIES: Swelling in legs noted, no cyanosis NEUROLOGICAL: Cranial nerves grossly intact, moves all extremities without gross motor or sensory deficit  SKIN: No rash,no lesion or erythema   PSYCHIATRY/BEHAVIORAL: Mood stable   Labs reviewed: Recent Labs    12/15/23 1111 03/09/24 0146 08/31/24 1535  NA 141 137 141  K 4.5 4.6 4.6  CL 102 105 103  CO2 29 24 27   GLUCOSE 89 86 101  BUN 23 31* 44*  CREATININE 1.16* 1.05* 1.50*  CALCIUM 9.7 9.0  9.5   Recent Labs    12/15/23 1111 03/09/24 0146 08/31/24 1535  AST 20 26 23   ALT 13 20 16   ALKPHOS  --  52  --   BILITOT 0.7 1.1 0.7  PROT 7.0 6.2* 6.9  ALBUMIN  --  3.3*  --    Recent Labs    12/15/23 1111 03/09/24 0146 08/31/24 1535  WBC 5.5 7.2 5.8  NEUTROABS 3,454 4.7 3,439  HGB 13.4 12.0 12.5  HCT 40.9 37.2 38.2  MCV 92.7 97.1 93.4  PLT 229 207 214   Lab Results  Component Value Date   TSH 2.67 08/31/2024   No  results found for: HGBA1C Lab Results  Component Value Date   CHOL 184 12/15/2023   HDL 78 12/15/2023   LDLCALC 93 12/15/2023   TRIG 51 12/15/2023   CHOLHDL 2.4 12/15/2023    Significant Diagnostic Results in last 30 days:  No results found.  Assessment/Plan  Heart failure with diastolic dysfunction and lower extremity edema Chronic heart failure with diastolic dysfunction, presenting with persistent lower extremity edema. Edema fluctuates in size and is associated with discomfort. - Refill furosemide  prescription - Instruct to take a whole furosemide  pill instead of half for three days to manage edema - Continue spironolactone  - Order lab tests including CBC, CMP, and thyroid  function tests to monitor for anemia, infection, kidney, liver function, and electrolytes  Essential hypertension Well-controlled essential hypertension with recent blood pressure reading of 112/60 mmHg. - Continue carvedilol  for blood pressure management  Cervical spondylosis with neck pain and stiffness Chronic cervical spondylosis with intermittent neck pain and stiffness, exacerbated by certain movements. - Discuss with son about referral to orthopedic specialist for potential injection therapy  Low back pain Chronic low back pain, particularly on the lower right side.  Dyspepsia/heartburn Ongoing dyspepsia managed with famotidine . - Continue famotidine  for heartburn management  Generalized anxiety disorder Generalized anxiety disorder managed with Xanax . -  Continue Xanax  as prescribed  Alzheimer's disease, late onset Late-onset Alzheimer's disease.  - continue with supportive care   Family/ staff Communication: Reviewed plan of care with patient and son verbalized understanding  Labs/tests ordered:  - CBC with Differential/Platelet - CMP with eGFR([facility]) - TSH  Next Appointment : Return in about 6 months (around 03/01/2025) for medical mangement of chronic issues.SABRA   Spent 30 minutes of Face to face and non-face to face with patient  >50% time spent counseling; reviewing medical record; tests; labs; documentation and developing future plan of care.   Roxan JAYSON Plough, NP

## 2024-10-03 ENCOUNTER — Ambulatory Visit: Admitting: Podiatry

## 2024-10-03 ENCOUNTER — Encounter: Payer: Self-pay | Admitting: Podiatry

## 2024-10-03 DIAGNOSIS — B351 Tinea unguium: Secondary | ICD-10-CM

## 2024-10-03 DIAGNOSIS — M79675 Pain in left toe(s): Secondary | ICD-10-CM | POA: Diagnosis not present

## 2024-10-03 DIAGNOSIS — M79674 Pain in right toe(s): Secondary | ICD-10-CM | POA: Diagnosis not present

## 2024-10-09 ENCOUNTER — Encounter: Payer: Self-pay | Admitting: Podiatry

## 2024-10-09 NOTE — Progress Notes (Signed)
  Subjective:  Patient ID: Heather [name], female    DOB: 19-Dec-1917,  MRN: [id number]  Heather [name] presents to clinic today for painful elongated mycotic toenails 1-5 bilaterally which are tender when wearing enclosed shoe gear. Pain is relieved with periodic professional debridement. She is accompanied by her son on today's visit. Chief Complaint  Patient presents with   Toe Pain    NP Ngetich is her PCP. Denies being diabetic   New problem(s): None.   PCP is Ngetich, Roxan BROCKS, NP.  Allergies  Allergen Reactions   Fentanyl  Nausea Only and Other (See Comments)    Pt states that she is not allergic     Naprosyn  [Naproxen ] Other (See Comments)    Patient felt tongue and face swelling. Went to ED where no visible swelling noted    Review of Systems: Negative except as noted in the HPI.  Objective: No changes noted in today's physical examination. There were no vitals filed for this visit. Heather [name] is a pleasant 89 y.o. female in NAD. AAO x 3. Sitting up in wheelchair.  Vascular Examination: CFT <4 seconds b/l. DP pulses diminished b/l. PT pulses diminished b/l. Digital hair absent. Skin temperature gradient warm to warm b/l. No ischemia or gangrene. No cyanosis or clubbing noted b/l. Nonpitting edema noted BLE.   Neurological Examination: Sensation grossly intact b/l with 10 gram monofilament. Vibratory sensation intact b/l.   Dermatological Examination: Pedal skin thin, shiny and atrophic b/l. No open wounds. No interdigital macerations.   Toenails 1-5 b/l thick, discolored, elongated with subungual debris and pain on dorsal palpation.   No hyperkeratotic nor porokeratotic lesions present on today's visit.  Musculoskeletal Examination: Muscle strength 5/5 to all lower extremity muscle groups bilaterally. Pes planus deformity noted bilateral LE. Utilizes wheelchair for mobility assistance.  Radiographs: None  Assessment/Plan: 1. Pain due to onychomycosis  of toenails of both feet    Patient was evaluated and treated. All patient's and/or POA's questions/concerns addressed on today's visit. Toenails 1-5 b/l debrided in length and girth without incident. Continue soft, supportive shoe gear daily. Report any pedal injuries to medical professional. Call office if there are any questions/concerns. -Patient/POA to call should there be question/concern in the interim.   Return in about 3 months (around 01/03/2025).  Delon LITTIE Merlin, DPM      [facility] LOCATION: [address], [place] [address]                   Office [phone]   [facility] LOCATION: [address] [place], [place] [address] Office [phone]

## 2024-12-01 ENCOUNTER — Encounter: Payer: Self-pay | Admitting: Family

## 2024-12-01 ENCOUNTER — Ambulatory Visit: Admitting: Family

## 2024-12-01 VITALS — BP 114/72 | HR 78 | Temp 97.7°F | Resp 17

## 2024-12-01 DIAGNOSIS — M25521 Pain in right elbow: Secondary | ICD-10-CM

## 2024-12-01 DIAGNOSIS — L22 Diaper dermatitis: Secondary | ICD-10-CM | POA: Diagnosis not present

## 2024-12-01 DIAGNOSIS — N3001 Acute cystitis with hematuria: Secondary | ICD-10-CM | POA: Diagnosis not present

## 2024-12-01 LAB — POCT URINALYSIS DIPSTICK
Bilirubin, UA: NEGATIVE
Glucose, UA: NEGATIVE
Ketones, UA: NEGATIVE
Nitrite, UA: NEGATIVE
Protein, UA: POSITIVE — AB
Spec Grav, UA: 1.015
Urobilinogen, UA: 0.2 U/dL
pH, UA: 7

## 2024-12-01 MED ORDER — NITROFURANTOIN MONOHYD MACRO 100 MG PO CAPS: 100.0000 mg | ORAL_CAPSULE | Freq: Two times a day (BID) | ORAL | 0 refills | Status: AC

## 2024-12-01 NOTE — Progress Notes (Signed)
 "  Provider: Yobani Schertzer FNP-C  Makita Blow, Roxan BROCKS, NP  Patient Care Team: Harmony Sandell, Roxan BROCKS, NP as PCP - General (Family Medicine) Delford Maude BROCKS, MD as PCP - Cardiology (Cardiology) Winnie Ficks, MD as Referring Physician (Internal Medicine) Anderson Maude ORN, MD (Inactive) as Attending Physician (Orthopedic Surgery) Octavia Charlie Hamilton, MD as Consulting Physician (Ophthalmology) Christi Hamilton HERO, MD as Consulting Physician (Pulmonary Disease)  Extended Emergency Contact Information Primary Emergency Contact: Cormany,Michael Address: [address]          [place], [place] [address] [place] Home Phone: [phone] Mobile Phone: [phone] Relation: Son Secondary Emergency Contact: [facility] Phone: [phone] Relation: Other Preferred language: English Interpreter needed? No  Code Status:  Full Code  Goals of care: Advanced Directive information    12/01/2024    2:33 PM  Advanced Directives  Does Patient Have a Medical Advance Directive? Yes  Type of Estate Agent of [place];Living will  Does patient want to make changes to medical advance directive? No - Patient declined  Copy of Healthcare Power of Attorney in Chart? No - copy requested     Chief Complaint  Patient presents with   Irritation on bottom area    Frequent urinating.    History of Present Illness   Heather [name] is a 89 year old female who presents with urinary incontinence and dysuria. She is here with her son who is her primary care giver.  She has experienced increased urinary incontinence and dysuria, with the pain occurring during urination and becoming more frequent. The incontinence is significant enough that she sometimes uses a towel in her wheelchair instead of a pad or pull-up. Her fluid intake is inconsistent, although she drinks a lot of water. she declines to be assisted with her bath. Nurse assistant had to stop coming since she was  declining to be assisted. Now has a family friend who helps but most of the time declines for anyone to assist with her peri-care. Tends to wipe from the back to the front despite being advised by care givers, son and provider states she is 89 yrs and knows her body better   She experiences daily abdominal and back pain. Additionally, she has a sore on her back, buttocks, and hip, which sometimes itches. Despite taking medication, she experiences constipation, referring to the medication as 'the pill'.  She mentions having a fever earlier in the day but does not consistently feel hot. Her appetite is described as 'spirited', though she has been eating less. Her diet includes biscuits, hash browns, water, and pineapples, and she tends to eat more at night. She avoids salt and prefers sugary foods.  She reports taking Senokot and refers to it as 'the pill'. She also takes a pressure pill and other medications, which she obtains from [facility].   Past Medical History:  Diagnosis Date   Abdominal pain, left lower quadrant 06/22/2013   Abnormal CT scan of lung 07/16/2015   Allergic rhinitis, cause unspecified 02/21/2014   Anxiety state, unspecified    Breast cancer (HCC)    NO STICK OR BLOOD PRESSURE CHECKS IN LEFT ARM   Carpal tunnel syndrome    Carpal tunnel syndrome on both sides 10/03/2008   Qualifier: Diagnosis of  By: Christi MD, Hamilton HERO    Cervical spondylosis    Chronic pain syndrome 03/03/2019   Colon polyp 12/19/2009   Transverse-polypoid colorectal mucosa   Constipation    Cough 10/21/2014   Degenerative joint disease  Diastolic dysfunction    Diverticula of colon    Diverticulosis of colon 05/24/2002   Qualifier: Diagnosis of  By: Derrek Ramp     Gastritis, chronic    Hiatal hernia    Hypertension    Low back pain    LPRD (laryngopharyngeal reflux disease) 04/04/2014   Osteoporosis 05/22/2020   RBBB (right bundle branch block with left anterior fascicular block)    Renal cyst     Venous insufficiency    Past Surgical History:  Procedure Laterality Date   CARPAL TUNNEL RELEASE     INTRAMEDULLARY (IM) NAIL INTERTROCHANTERIC Right 11/13/2021   Procedure: INTRAMEDULLARY (IM) NAIL INTERTROCHANTRIC;  Surgeon: Genelle Standing, MD;  Location: [facility];  Service: Orthopedics;  Laterality: Right;   Left mastectomy      Allergies[1]  Outpatient Encounter Medications as of 12/01/2024  Medication Sig   acetaminophen  (TYLENOL ) 500 MG tablet Take 500 mg by mouth every 6 (six) hours as needed for moderate pain or headache.   ALPRAZolam  (XANAX ) 0.5 MG tablet Take 0.5 mg by mouth 3 (three) times daily as needed.   carvedilol  (COREG ) 3.125 MG tablet TAKE 1 TABLET BY MOUTH TWICE DAILY HOLD  FOR  SBP<  110  OR  HR < 70   Cholecalciferol (D3) 25 MCG (1000 UT) capsule Take 1,000 Units by mouth daily.   famotidine  (PEPCID ) 20 MG tablet Take 1 tablet (20 mg total) by mouth daily.   feeding supplement, ENSURE ENLIVE, (ENSURE ENLIVE) LIQD Take 237 mLs by mouth 2 (two) times daily between meals.   furosemide  (LASIX ) 20 MG tablet Take 0.5 tablets (10 mg total) by mouth daily as needed.   Multiple Vitamins-Minerals (MULTIVITAMINS THER. W/MINERALS) TABS Take 1 tablet by mouth daily.   nitrofurantoin , macrocrystal-monohydrate, (MACROBID ) 100 MG capsule Take 1 capsule (100 mg total) by mouth 2 (two) times daily for 7 days.   polyethylene glycol (MIRALAX  / GLYCOLAX ) 17 g packet Take 17 g by mouth daily. (Patient taking differently: Take 17 g by mouth as needed.)   sennosides-docusate sodium  (SENOKOT-S) 8.6-50 MG tablet Take 1-2 tablets by mouth daily as needed for constipation.   spironolactone  (ALDACTONE ) 25 MG tablet Take 1 tablet (25 mg total) by mouth daily.   triamcinolone  cream (KENALOG ) 0.1 % Apply 1 Application topically 2 (two) times daily.   No facility-administered encounter medications on file as of 12/01/2024.    Review of Systems  Constitutional:  Negative for appetite change,  chills, fatigue, fever and unexpected weight change.  HENT:  Negative for congestion, ear discharge, ear pain, hearing loss, nosebleeds, rhinorrhea, sinus pressure, sinus pain, sneezing, sore throat and tinnitus.   Eyes:  Negative for pain, discharge, redness, itching and visual disturbance.  Respiratory:  Negative for cough, chest tightness, shortness of breath and wheezing.   Cardiovascular:  Negative for chest pain, palpitations and leg swelling.  Gastrointestinal:  Negative for abdominal distention, abdominal pain, blood in stool, constipation, diarrhea, nausea and vomiting.  Genitourinary:  Positive for dysuria and frequency. Negative for difficulty urinating, flank pain and urgency.  Musculoskeletal:  Positive for gait problem. Negative for back pain, joint swelling and myalgias.  Skin:  Negative for color change, pallor, rash and wound.  Neurological:  Negative for dizziness, weakness, light-headedness and headaches.  Hematological:  Does not bruise/bleed easily.  Psychiatric/Behavioral:  Negative for agitation, behavioral problems, confusion, hallucinations and sleep disturbance. The patient is not nervous/anxious.     Immunization History  Administered Date(s) Administered   Fluad Quad(high Dose 65+) 08/19/2021  Fluad Trivalent(High Dose 65+) 08/07/2023   INFLUENZA, HIGH DOSE SEASONAL PF 08/21/2015, 08/31/2024   Influenza,inj,Quad PF,6+ Mos 09/18/2015   PFIZER(Purple Top)SARS-COV-2 Vaccination 04/18/2020, 05/10/2020   Pneumococcal Polysaccharide-23 02/07/2015   Pertinent  Health Maintenance Due  Topic Date Due   Mammogram  08/31/2025 (Originally 08/15/2020)   Influenza Vaccine  Completed   Bone Density Scan  Completed      08/07/2023   11:09 AM 09/18/2023    1:08 PM 12/18/2023   10:49 AM 08/31/2024    2:49 PM 12/01/2024    2:33 PM  Fall Risk  Falls in the past year? 1 0 0 0 0  Was there an injury with Fall? 1  0  0  0  0  Fall Risk Category Calculator 2 0 0 0 0  Patient  at Risk for Falls Due to History of fall(s);Impaired balance/gait;Impaired mobility No Fall Risks  No Fall Risks No Fall Risks  Fall risk Follow up Falls evaluation completed   Falls evaluation completed Falls evaluation completed     Data saved with a previous flowsheet row definition   Functional Status Survey:    Vitals:   12/01/24 1436  BP: 114/72  Pulse: 78  Resp: 17  Temp: 97.7 F (36.5 C)  SpO2: 96%   There is no height or weight on file to calculate BMI. Physical Exam  VITALS: T- 97.7, P- 78, BP- 114/72, SaO2- 96% GENERAL: Alert, cooperative, well developed, no acute distress. HEENT: Normocephalic, normal oropharynx, moist mucous membranes. CHEST: Clear to auscultation bilaterally, no wheezes, rhonchi, or crackles. CARDIOVASCULAR: Normal heart rate and rhythm, S1 and S2 normal without murmurs. ABDOMEN: Soft, non-tender, non-distended, without organomegaly, normal bowel sounds. Abdominal pain on the right side. No costovertebral angle tenderness. GENITOURINARY: No wound on the groin. EXTREMITIES: No cyanosis or edema. No swelling on the elbow. NEUROLOGICAL: Cranial nerves grossly intact, moves all extremities without gross motor or sensory deficit.      Labs reviewed: Recent Labs    12/15/23 1111 03/09/24 0146 08/31/24 1535  NA 141 137 141  K 4.5 4.6 4.6  CL 102 105 103  CO2 29 24 27   GLUCOSE 89 86 101  BUN 23 31* 44*  CREATININE 1.16* 1.05* 1.50*  CALCIUM 9.7 9.0 9.5   Recent Labs    12/15/23 1111 03/09/24 0146 08/31/24 1535  AST 20 26 23   ALT 13 20 16   ALKPHOS  --  52  --   BILITOT 0.7 1.1 0.7  PROT 7.0 6.2* 6.9  ALBUMIN  --  3.3*  --    Recent Labs    12/15/23 1111 03/09/24 0146 08/31/24 1535  WBC 5.5 7.2 5.8  NEUTROABS 3,454 4.7 3,439  HGB 13.4 12.0 12.5  HCT 40.9 37.2 38.2  MCV 92.7 97.1 93.4  PLT 229 207 214   Lab Results  Component Value Date   TSH 2.67 08/31/2024   No results found for: HGBA1C Lab Results  Component Value  Date   CHOL 184 12/15/2023   HDL 78 12/15/2023   LDLCALC 93 12/15/2023   TRIG 51 12/15/2023   CHOLHDL 2.4 12/15/2023    Significant Diagnostic Results in last 30 days:  No results found.  Assessment/Plan  Urinary tract infection Symptoms include dysuria and increase urinary frequency. No fever or chills reported. Urine culture pending to confirm diagnosis and guide antibiotic therapy. - Sent urine for culture - Peri-Care hygiene discussed but decline advise to wipe from front to back but not back to front. Patient's  advance age contributing to her way of cleaning states knows better how to clean her body since she is 89 yrs.  - Started nitrofurantoin , one tablet in the morning and one in the evening - Sent prescription to pharmacy  Diaper dermatitis No open wounds or yeast infection. Desitin used for skin protection. - Continue using Desitin for skin protection  Pain in left elbow Pain without swelling. No pain on palpation. Possible arthritis considered. - Apply voltaren  gel to the elbow for pain relief - Advised to avoid resting hands on the elbow   Family/ staff Communication: Reviewed plan of care with patient and son verbalized understanding  Labs/tests ordered:  - Urine Culture; Future - POC Urinalysis Dipstick  Next Appointment: Return if symptoms worsen or fail to improve.  Total time: 30 minutes. Greater than 50% of total time spent doing patient education regarding Urinary tract infection, diaper dermatitis pain in the left,health maintenance including symptom/medication management.   Roxan BROCKS Rishit Burkhalter, NP    [1]  Allergies Allergen Reactions   Fentanyl  Nausea Only and Other (See Comments)    Pt states that she is not allergic     Naprosyn  [Naproxen ] Other (See Comments)    Patient felt tongue and face swelling. Went to ED where no visible swelling noted   "

## 2024-12-03 LAB — URINE CULTURE
MICRO NUMBER:: 17474218
SPECIMEN QUALITY:: ADEQUATE

## 2024-12-05 ENCOUNTER — Ambulatory Visit: Payer: Self-pay | Admitting: Family

## 2024-12-09 ENCOUNTER — Other Ambulatory Visit (HOSPITAL_COMMUNITY): Payer: Self-pay

## 2024-12-09 NOTE — Telephone Encounter (Signed)
 Error

## 2025-01-17 ENCOUNTER — Ambulatory Visit: Admitting: Podiatry

## 2025-03-01 ENCOUNTER — Ambulatory Visit: Payer: Self-pay | Admitting: Family
